# Patient Record
Sex: Female | Born: 1950
Health system: Southern US, Community
[De-identification: ages and names within clinical notes are randomized; demographics above are authoritative.]

## PROBLEM LIST (undated history)

## (undated) DIAGNOSIS — Z923 Personal history of irradiation: Secondary | ICD-10-CM

## (undated) DIAGNOSIS — M5030 Other cervical disc degeneration, unspecified cervical region: Secondary | ICD-10-CM

## (undated) DIAGNOSIS — R7309 Other abnormal glucose: Secondary | ICD-10-CM

## (undated) DIAGNOSIS — E119 Type 2 diabetes mellitus without complications: Secondary | ICD-10-CM

## (undated) DIAGNOSIS — J9383 Other pneumothorax: Secondary | ICD-10-CM

## (undated) DIAGNOSIS — R945 Abnormal results of liver function studies: Secondary | ICD-10-CM

## (undated) DIAGNOSIS — C3490 Malignant neoplasm of unspecified part of unspecified bronchus or lung: Secondary | ICD-10-CM

## (undated) DIAGNOSIS — E559 Vitamin D deficiency, unspecified: Secondary | ICD-10-CM

## (undated) DIAGNOSIS — K5730 Diverticulosis of large intestine without perforation or abscess without bleeding: Secondary | ICD-10-CM

## (undated) DIAGNOSIS — K7689 Other specified diseases of liver: Secondary | ICD-10-CM

## (undated) DIAGNOSIS — C50919 Malignant neoplasm of unspecified site of unspecified female breast: Secondary | ICD-10-CM

## (undated) DIAGNOSIS — F17200 Nicotine dependence, unspecified, uncomplicated: Secondary | ICD-10-CM

## (undated) DIAGNOSIS — G589 Mononeuropathy, unspecified: Secondary | ICD-10-CM

## (undated) DIAGNOSIS — Z9221 Personal history of antineoplastic chemotherapy: Secondary | ICD-10-CM

## (undated) DIAGNOSIS — E875 Hyperkalemia: Secondary | ICD-10-CM

## (undated) DIAGNOSIS — M47812 Spondylosis without myelopathy or radiculopathy, cervical region: Secondary | ICD-10-CM

## (undated) DIAGNOSIS — R3129 Other microscopic hematuria: Secondary | ICD-10-CM

## (undated) DIAGNOSIS — E785 Hyperlipidemia, unspecified: Secondary | ICD-10-CM

## (undated) DIAGNOSIS — F1010 Alcohol abuse, uncomplicated: Secondary | ICD-10-CM

## (undated) DIAGNOSIS — H269 Unspecified cataract: Secondary | ICD-10-CM

## (undated) DIAGNOSIS — Z853 Personal history of malignant neoplasm of breast: Secondary | ICD-10-CM

## (undated) HISTORY — PX: TUBAL LIGATION: SHX77

## (undated) HISTORY — DX: Diverticulosis of large intestine without perforation or abscess without bleeding: K57.30

## (undated) HISTORY — DX: Alcohol abuse, uncomplicated: F10.10

## (undated) HISTORY — DX: Malignant neoplasm of unspecified part of unspecified bronchus or lung: C34.90

## (undated) HISTORY — DX: Hyperkalemia: E87.5

## (undated) HISTORY — DX: Spondylosis without myelopathy or radiculopathy, cervical region: M47.812

## (undated) HISTORY — DX: Hyperlipidemia, unspecified: E78.5

## (undated) HISTORY — PX: MOUTH SURGERY: SHX715

## (undated) HISTORY — DX: Other microscopic hematuria: R31.29

## (undated) HISTORY — DX: Mononeuropathy, unspecified: G58.9

## (undated) HISTORY — DX: Personal history of malignant neoplasm of breast: Z85.3

## (undated) HISTORY — DX: Vitamin D deficiency, unspecified: E55.9

## (undated) HISTORY — DX: Other cervical disc degeneration, unspecified cervical region: M50.30

## (undated) HISTORY — DX: Other abnormal glucose: R73.09

## (undated) HISTORY — DX: Other specified diseases of liver: K76.89

## (undated) HISTORY — DX: Nicotine dependence, unspecified, uncomplicated: F17.200

## (undated) HISTORY — DX: Abnormal results of liver function studies: R94.5

## (undated) HISTORY — DX: Unspecified cataract: H26.9

## (undated) HISTORY — PX: COLONOSCOPY: SHX174

## (undated) MED FILL — Iron Sucrose Inj 20 MG/ML (Fe Equiv): INTRAVENOUS | Qty: 15 | Status: AC

---

## 1996-04-12 DIAGNOSIS — C50919 Malignant neoplasm of unspecified site of unspecified female breast: Secondary | ICD-10-CM

## 1996-04-12 HISTORY — DX: Malignant neoplasm of unspecified site of unspecified female breast: C50.919

## 1996-04-12 HISTORY — PX: BREAST LUMPECTOMY: SHX2

## 1998-02-14 ENCOUNTER — Other Ambulatory Visit: Admission: RE | Admit: 1998-02-14 | Discharge: 1998-02-14 | Payer: Self-pay | Admitting: Obstetrics and Gynecology

## 1999-07-02 ENCOUNTER — Other Ambulatory Visit: Admission: RE | Admit: 1999-07-02 | Discharge: 1999-07-02 | Payer: Self-pay | Admitting: Obstetrics and Gynecology

## 2000-07-01 ENCOUNTER — Other Ambulatory Visit: Admission: RE | Admit: 2000-07-01 | Discharge: 2000-07-01 | Payer: Self-pay | Admitting: Obstetrics and Gynecology

## 2001-10-27 ENCOUNTER — Other Ambulatory Visit: Admission: RE | Admit: 2001-10-27 | Discharge: 2001-10-27 | Payer: Self-pay | Admitting: Obstetrics and Gynecology

## 2001-12-06 ENCOUNTER — Encounter: Payer: Self-pay | Admitting: General Surgery

## 2001-12-06 ENCOUNTER — Encounter: Admission: RE | Admit: 2001-12-06 | Discharge: 2001-12-06 | Payer: Self-pay | Admitting: General Surgery

## 2001-12-08 ENCOUNTER — Ambulatory Visit (HOSPITAL_BASED_OUTPATIENT_CLINIC_OR_DEPARTMENT_OTHER): Admission: RE | Admit: 2001-12-08 | Discharge: 2001-12-08 | Payer: Self-pay | Admitting: General Surgery

## 2001-12-08 ENCOUNTER — Encounter (INDEPENDENT_AMBULATORY_CARE_PROVIDER_SITE_OTHER): Payer: Self-pay | Admitting: *Deleted

## 2001-12-11 HISTORY — PX: BREAST BIOPSY: SHX20

## 2002-10-26 ENCOUNTER — Encounter: Payer: Self-pay | Admitting: Family Medicine

## 2002-10-26 ENCOUNTER — Encounter: Admission: RE | Admit: 2002-10-26 | Discharge: 2002-10-26 | Payer: Self-pay | Admitting: Family Medicine

## 2002-11-02 ENCOUNTER — Other Ambulatory Visit: Admission: RE | Admit: 2002-11-02 | Discharge: 2002-11-02 | Payer: Self-pay | Admitting: Obstetrics and Gynecology

## 2002-12-24 ENCOUNTER — Encounter: Admission: RE | Admit: 2002-12-24 | Discharge: 2002-12-24 | Payer: Self-pay | Admitting: Family Medicine

## 2002-12-24 ENCOUNTER — Encounter: Payer: Self-pay | Admitting: Family Medicine

## 2003-06-14 ENCOUNTER — Encounter: Admission: RE | Admit: 2003-06-14 | Discharge: 2003-06-14 | Payer: Self-pay | Admitting: Family Medicine

## 2003-08-02 ENCOUNTER — Encounter: Admission: RE | Admit: 2003-08-02 | Discharge: 2003-08-02 | Payer: Self-pay | Admitting: Oncology

## 2003-12-06 LAB — HM COLONOSCOPY

## 2003-12-23 ENCOUNTER — Other Ambulatory Visit: Admission: RE | Admit: 2003-12-23 | Discharge: 2003-12-23 | Payer: Self-pay | Admitting: Obstetrics and Gynecology

## 2004-01-03 ENCOUNTER — Encounter: Admission: RE | Admit: 2004-01-03 | Discharge: 2004-01-03 | Payer: Self-pay | Admitting: Obstetrics and Gynecology

## 2004-01-11 ENCOUNTER — Encounter: Payer: Self-pay | Admitting: Family Medicine

## 2004-01-11 LAB — CONVERTED CEMR LAB

## 2004-04-12 DIAGNOSIS — M47812 Spondylosis without myelopathy or radiculopathy, cervical region: Secondary | ICD-10-CM

## 2004-04-12 DIAGNOSIS — M5030 Other cervical disc degeneration, unspecified cervical region: Secondary | ICD-10-CM

## 2004-04-12 HISTORY — DX: Spondylosis without myelopathy or radiculopathy, cervical region: M47.812

## 2004-04-12 HISTORY — DX: Other cervical disc degeneration, unspecified cervical region: M50.30

## 2004-04-23 ENCOUNTER — Ambulatory Visit: Payer: Self-pay | Admitting: Family Medicine

## 2004-04-23 LAB — CONVERTED CEMR LAB: Hgb A1c MFr Bld: 6.1 %

## 2004-04-24 ENCOUNTER — Ambulatory Visit: Payer: Self-pay | Admitting: Family Medicine

## 2004-05-26 ENCOUNTER — Ambulatory Visit: Payer: Self-pay | Admitting: Family Medicine

## 2004-07-30 ENCOUNTER — Ambulatory Visit: Payer: Self-pay | Admitting: Oncology

## 2004-07-31 ENCOUNTER — Ambulatory Visit (HOSPITAL_COMMUNITY): Admission: RE | Admit: 2004-07-31 | Discharge: 2004-07-31 | Payer: Self-pay | Admitting: Oncology

## 2004-08-04 ENCOUNTER — Encounter: Admission: RE | Admit: 2004-08-04 | Discharge: 2004-08-04 | Payer: Self-pay | Admitting: Oncology

## 2004-08-21 ENCOUNTER — Ambulatory Visit: Payer: Self-pay | Admitting: Family Medicine

## 2004-10-01 ENCOUNTER — Ambulatory Visit: Payer: Self-pay | Admitting: Oncology

## 2004-12-22 ENCOUNTER — Ambulatory Visit: Payer: Self-pay | Admitting: Family Medicine

## 2005-02-11 ENCOUNTER — Ambulatory Visit: Payer: Self-pay | Admitting: Family Medicine

## 2006-01-25 ENCOUNTER — Ambulatory Visit: Payer: Self-pay | Admitting: Family Medicine

## 2006-08-15 ENCOUNTER — Other Ambulatory Visit: Admission: RE | Admit: 2006-08-15 | Discharge: 2006-08-15 | Payer: Self-pay | Admitting: Obstetrics and Gynecology

## 2006-10-13 ENCOUNTER — Telehealth: Payer: Self-pay | Admitting: Family Medicine

## 2006-11-17 ENCOUNTER — Telehealth (INDEPENDENT_AMBULATORY_CARE_PROVIDER_SITE_OTHER): Payer: Self-pay | Admitting: *Deleted

## 2006-12-06 ENCOUNTER — Ambulatory Visit: Payer: Self-pay | Admitting: Family Medicine

## 2006-12-06 DIAGNOSIS — Z87891 Personal history of nicotine dependence: Secondary | ICD-10-CM | POA: Insufficient documentation

## 2006-12-06 DIAGNOSIS — Z853 Personal history of malignant neoplasm of breast: Secondary | ICD-10-CM | POA: Insufficient documentation

## 2006-12-06 DIAGNOSIS — R945 Abnormal results of liver function studies: Secondary | ICD-10-CM | POA: Insufficient documentation

## 2006-12-06 DIAGNOSIS — G589 Mononeuropathy, unspecified: Secondary | ICD-10-CM

## 2006-12-06 DIAGNOSIS — K5730 Diverticulosis of large intestine without perforation or abscess without bleeding: Secondary | ICD-10-CM | POA: Insufficient documentation

## 2006-12-06 DIAGNOSIS — G5793 Unspecified mononeuropathy of bilateral lower limbs: Secondary | ICD-10-CM | POA: Insufficient documentation

## 2006-12-06 DIAGNOSIS — K760 Fatty (change of) liver, not elsewhere classified: Secondary | ICD-10-CM | POA: Insufficient documentation

## 2006-12-06 DIAGNOSIS — F1011 Alcohol abuse, in remission: Secondary | ICD-10-CM | POA: Insufficient documentation

## 2006-12-08 ENCOUNTER — Encounter: Admission: RE | Admit: 2006-12-08 | Discharge: 2006-12-08 | Payer: Self-pay | Admitting: Family Medicine

## 2006-12-13 ENCOUNTER — Encounter (INDEPENDENT_AMBULATORY_CARE_PROVIDER_SITE_OTHER): Payer: Self-pay | Admitting: *Deleted

## 2006-12-21 ENCOUNTER — Ambulatory Visit: Payer: Self-pay | Admitting: Internal Medicine

## 2006-12-30 ENCOUNTER — Ambulatory Visit: Payer: Self-pay | Admitting: Family Medicine

## 2007-02-01 ENCOUNTER — Encounter (INDEPENDENT_AMBULATORY_CARE_PROVIDER_SITE_OTHER): Payer: Self-pay | Admitting: *Deleted

## 2007-03-30 ENCOUNTER — Ambulatory Visit: Payer: Self-pay | Admitting: Family Medicine

## 2007-03-30 DIAGNOSIS — M5030 Other cervical disc degeneration, unspecified cervical region: Secondary | ICD-10-CM | POA: Insufficient documentation

## 2007-04-02 ENCOUNTER — Encounter: Admission: RE | Admit: 2007-04-02 | Discharge: 2007-04-02 | Payer: Self-pay | Admitting: Family Medicine

## 2007-04-07 ENCOUNTER — Telehealth: Payer: Self-pay | Admitting: Family Medicine

## 2007-04-11 ENCOUNTER — Ambulatory Visit: Payer: Self-pay

## 2007-04-11 ENCOUNTER — Encounter: Payer: Self-pay | Admitting: Family Medicine

## 2007-04-18 ENCOUNTER — Ambulatory Visit: Payer: Self-pay | Admitting: Family Medicine

## 2007-04-27 ENCOUNTER — Encounter: Payer: Self-pay | Admitting: Family Medicine

## 2008-03-29 ENCOUNTER — Telehealth (INDEPENDENT_AMBULATORY_CARE_PROVIDER_SITE_OTHER): Payer: Self-pay | Admitting: Internal Medicine

## 2008-06-04 ENCOUNTER — Encounter: Payer: Self-pay | Admitting: Family Medicine

## 2008-06-14 ENCOUNTER — Ambulatory Visit: Payer: Self-pay | Admitting: Family Medicine

## 2008-06-14 DIAGNOSIS — E1169 Type 2 diabetes mellitus with other specified complication: Secondary | ICD-10-CM | POA: Insufficient documentation

## 2008-06-14 DIAGNOSIS — T887XXA Unspecified adverse effect of drug or medicament, initial encounter: Secondary | ICD-10-CM | POA: Insufficient documentation

## 2008-06-14 DIAGNOSIS — E785 Hyperlipidemia, unspecified: Secondary | ICD-10-CM | POA: Insufficient documentation

## 2008-06-17 LAB — CONVERTED CEMR LAB
ALT: 14 units/L (ref 0–35)
AST: 19 units/L (ref 0–37)
Albumin: 5.1 g/dL (ref 3.5–5.2)
Alkaline Phosphatase: 66 units/L (ref 39–117)
BUN: 12 mg/dL (ref 6–23)
Band Neutrophils: 0 % (ref 0–10)
Basophils Absolute: 0 10*3/uL (ref 0.0–0.1)
Basophils Relative: 0 % (ref 0–1)
CO2: 21 meq/L (ref 19–32)
Calcium: 10.3 mg/dL (ref 8.4–10.5)
Chloride: 103 meq/L (ref 96–112)
Cholesterol: 229 mg/dL — ABNORMAL HIGH (ref 0–200)
Creatinine, Ser: 0.8 mg/dL (ref 0.40–1.20)
Eosinophils Absolute: 0.1 10*3/uL (ref 0.0–0.7)
Eosinophils Relative: 1 % (ref 0–5)
Glucose, Bld: 80 mg/dL (ref 70–99)
HCT: 42.4 % (ref 36.0–46.0)
HDL: 72 mg/dL (ref >39)
Hemoglobin: 14.5 g/dL (ref 12.0–15.0)
Hgb A1c MFr Bld: 5.6 % (ref 4.6–6.1)
LDL Cholesterol: 130 mg/dL — ABNORMAL HIGH (ref 0–99)
Lymphocytes Relative: 37 % (ref 12–46)
Lymphs Abs: 3.1 10*3/uL (ref 0.7–4.0)
MCHC: 34.2 g/dL (ref 30.0–36.0)
MCV: 95.5 fL (ref 78.0–100.0)
Monocytes Absolute: 0.7 10*3/uL (ref 0.1–1.0)
Monocytes Relative: 9 % (ref 3–12)
Neutro Abs: 4.5 10*3/uL (ref 1.7–7.7)
Neutrophils Relative %: 53 % (ref 43–77)
Platelets: 308 10*3/uL (ref 150–400)
Potassium: 5 meq/L (ref 3.5–5.3)
RBC: 4.44 M/uL (ref 3.87–5.11)
RDW: 12.6 % (ref 11.5–15.5)
Sodium: 140 meq/L (ref 135–145)
Total Bilirubin: 0.7 mg/dL (ref 0.3–1.2)
Total CHOL/HDL Ratio: 3.2
Total Protein: 7.7 g/dL (ref 6.0–8.3)
Triglycerides: 134 mg/dL (ref <150)
VLDL: 27 mg/dL (ref 0–40)
WBC: 8.4 10*3/uL (ref 4.0–10.5)

## 2008-06-20 ENCOUNTER — Encounter: Admission: RE | Admit: 2008-06-20 | Discharge: 2008-06-20 | Payer: Self-pay | Admitting: Family Medicine

## 2008-06-25 ENCOUNTER — Encounter (INDEPENDENT_AMBULATORY_CARE_PROVIDER_SITE_OTHER): Payer: Self-pay | Admitting: *Deleted

## 2008-06-28 ENCOUNTER — Encounter: Payer: Self-pay | Admitting: Family Medicine

## 2008-06-28 ENCOUNTER — Other Ambulatory Visit: Admission: RE | Admit: 2008-06-28 | Discharge: 2008-06-28 | Payer: Self-pay | Admitting: Family Medicine

## 2008-06-28 ENCOUNTER — Ambulatory Visit: Payer: Self-pay | Admitting: Family Medicine

## 2008-12-30 ENCOUNTER — Ambulatory Visit: Payer: Self-pay | Admitting: Family Medicine

## 2009-01-01 LAB — CONVERTED CEMR LAB
ALT: 15 units/L (ref 0–35)
AST: 26 units/L (ref 0–37)
Cholesterol: 248 mg/dL — ABNORMAL HIGH (ref 0–200)
Direct LDL: 158.1 mg/dL
HDL: 63.2 mg/dL (ref >39.00)
Total CHOL/HDL Ratio: 4
Triglycerides: 86 mg/dL (ref 0.0–149.0)
VLDL: 17.2 mg/dL (ref 0.0–40.0)

## 2009-02-12 ENCOUNTER — Ambulatory Visit: Payer: Self-pay | Admitting: Family Medicine

## 2009-02-14 LAB — CONVERTED CEMR LAB
ALT: 18 units/L (ref 0–35)
AST: 27 units/L (ref 0–37)
Cholesterol: 252 mg/dL — ABNORMAL HIGH (ref 0–200)
Direct LDL: 180.5 mg/dL
Glucose, Bld: 90 mg/dL (ref 70–99)
HDL: 64.7 mg/dL (ref >39.00)
Total CHOL/HDL Ratio: 4
Triglycerides: 70 mg/dL (ref 0.0–149.0)
VLDL: 14 mg/dL (ref 0.0–40.0)

## 2009-02-18 ENCOUNTER — Ambulatory Visit: Payer: Self-pay | Admitting: Family Medicine

## 2009-02-18 DIAGNOSIS — E119 Type 2 diabetes mellitus without complications: Secondary | ICD-10-CM | POA: Insufficient documentation

## 2009-02-18 DIAGNOSIS — R7303 Prediabetes: Secondary | ICD-10-CM | POA: Insufficient documentation

## 2009-02-18 DIAGNOSIS — E118 Type 2 diabetes mellitus with unspecified complications: Secondary | ICD-10-CM | POA: Insufficient documentation

## 2009-06-24 ENCOUNTER — Ambulatory Visit: Payer: Self-pay | Admitting: Family Medicine

## 2009-06-24 LAB — CONVERTED CEMR LAB
BUN: 11 mg/dL (ref 6–23)
Creatinine, Ser: 0.6 mg/dL (ref 0.4–1.2)

## 2009-06-27 ENCOUNTER — Ambulatory Visit: Payer: Self-pay | Admitting: Cardiovascular Disease

## 2009-07-29 ENCOUNTER — Telehealth: Payer: Self-pay | Admitting: Family Medicine

## 2009-07-29 ENCOUNTER — Ambulatory Visit: Payer: Self-pay | Admitting: Family Medicine

## 2009-07-30 LAB — CONVERTED CEMR LAB: Vit D, 25-Hydroxy: 21 ng/mL — ABNORMAL LOW (ref 30–89)

## 2009-07-31 LAB — CONVERTED CEMR LAB
ALT: 15 units/L (ref 0–35)
AST: 22 units/L (ref 0–37)
Albumin: 4.2 g/dL (ref 3.5–5.2)
Alkaline Phosphatase: 63 units/L (ref 39–117)
BUN: 10 mg/dL (ref 6–23)
Basophils Absolute: 0 10*3/uL (ref 0.0–0.1)
Basophils Relative: 0.4 % (ref 0.0–3.0)
Bilirubin, Direct: 0 mg/dL (ref 0.0–0.3)
CO2: 29 meq/L (ref 19–32)
Calcium: 9.8 mg/dL (ref 8.4–10.5)
Chloride: 106 meq/L (ref 96–112)
Cholesterol: 223 mg/dL — ABNORMAL HIGH (ref 0–200)
Creatinine, Ser: 0.8 mg/dL (ref 0.4–1.2)
Direct LDL: 132.4 mg/dL
Eosinophils Absolute: 0 10*3/uL (ref 0.0–0.7)
Eosinophils Relative: 0.7 % (ref 0.0–5.0)
GFR calc non Af Amer: 78.13 mL/min (ref >60)
Glucose, Bld: 95 mg/dL (ref 70–99)
HCT: 40.5 % (ref 36.0–46.0)
HDL: 72.5 mg/dL (ref >39.00)
Hemoglobin: 14 g/dL (ref 12.0–15.0)
Hgb A1c MFr Bld: 5.9 % (ref 4.6–6.5)
Lymphocytes Relative: 26 % (ref 12.0–46.0)
Lymphs Abs: 1.7 10*3/uL (ref 0.7–4.0)
MCHC: 34.5 g/dL (ref 30.0–36.0)
MCV: 100.3 fL — ABNORMAL HIGH (ref 78.0–100.0)
Monocytes Absolute: 0.5 10*3/uL (ref 0.1–1.0)
Monocytes Relative: 7.8 % (ref 3.0–12.0)
Neutro Abs: 4.2 10*3/uL (ref 1.4–7.7)
Neutrophils Relative %: 65.1 % (ref 43.0–77.0)
Platelets: 244 10*3/uL (ref 150.0–400.0)
Potassium: 5.2 meq/L — ABNORMAL HIGH (ref 3.5–5.1)
RBC: 4.04 M/uL (ref 3.87–5.11)
RDW: 13.8 % (ref 11.5–14.6)
Sodium: 143 meq/L (ref 135–145)
TSH: 1.77 microintl units/mL (ref 0.35–5.50)
Total Bilirubin: 0.6 mg/dL (ref 0.3–1.2)
Total CHOL/HDL Ratio: 3
Total Protein: 7.5 g/dL (ref 6.0–8.3)
Triglycerides: 92 mg/dL (ref 0.0–149.0)
VLDL: 18.4 mg/dL (ref 0.0–40.0)
WBC: 6.4 10*3/uL (ref 4.5–10.5)

## 2009-08-04 ENCOUNTER — Other Ambulatory Visit: Admission: RE | Admit: 2009-08-04 | Discharge: 2009-08-04 | Payer: Self-pay | Admitting: Family Medicine

## 2009-08-04 ENCOUNTER — Encounter: Admission: RE | Admit: 2009-08-04 | Discharge: 2009-08-04 | Payer: Self-pay | Admitting: Family Medicine

## 2009-08-04 ENCOUNTER — Ambulatory Visit: Payer: Self-pay | Admitting: Family Medicine

## 2009-08-04 DIAGNOSIS — E559 Vitamin D deficiency, unspecified: Secondary | ICD-10-CM | POA: Insufficient documentation

## 2009-08-04 DIAGNOSIS — E875 Hyperkalemia: Secondary | ICD-10-CM | POA: Insufficient documentation

## 2009-08-04 DIAGNOSIS — Z780 Asymptomatic menopausal state: Secondary | ICD-10-CM | POA: Insufficient documentation

## 2009-08-04 LAB — HM MAMMOGRAPHY: HM Mammogram: NEGATIVE

## 2009-08-04 LAB — HM PAP SMEAR

## 2009-08-06 ENCOUNTER — Encounter (INDEPENDENT_AMBULATORY_CARE_PROVIDER_SITE_OTHER): Payer: Self-pay | Admitting: *Deleted

## 2009-08-12 ENCOUNTER — Encounter (INDEPENDENT_AMBULATORY_CARE_PROVIDER_SITE_OTHER): Payer: Self-pay | Admitting: *Deleted

## 2009-08-12 LAB — CONVERTED CEMR LAB: Pap Smear: NEGATIVE

## 2009-09-10 ENCOUNTER — Telehealth: Payer: Self-pay | Admitting: Family Medicine

## 2009-09-15 ENCOUNTER — Telehealth: Payer: Self-pay | Admitting: Family Medicine

## 2009-10-28 ENCOUNTER — Encounter (INDEPENDENT_AMBULATORY_CARE_PROVIDER_SITE_OTHER): Payer: Self-pay | Admitting: *Deleted

## 2010-04-08 ENCOUNTER — Telehealth: Payer: Self-pay | Admitting: Family Medicine

## 2010-04-27 ENCOUNTER — Telehealth: Payer: Self-pay | Admitting: Family Medicine

## 2010-05-14 NOTE — Progress Notes (Signed)
Summary: refill request for flexeril  Phone Note Refill Request Message from:  Fax from Pharmacy  Refills Requested: Medication #1:  CYCLOBENZAPRINE HCL 10 MG  TABS Take 1 tablet by mouth three time a day prn   Last Refilled: 12/22/2004 Faxed request from [facility]  Initial call taken by: Marty Heck,  March 29, 2008 2:04 PM  Follow-up for Phone Call        Rx attatched  Dixie Dials FNP  March 29, 2008 2:19 PM   Additional Follow-up for Phone Call Additional follow up Details #1::        Called to [facility] Additional Follow-up by: Marty Heck,  March 29, 2008 2:34 PM      Prescriptions: CYCLOBENZAPRINE HCL 10 MG  TABS (CYCLOBENZAPRINE HCL) Take 1 tablet by mouth three time a day prn  #90 x 0   Entered and Authorized by:   Dixie Dials FNP   Signed by:   Dixie Dials FNP on 03/29/2008   Method used:   Print then Give to Patient   RxID:   [id number]

## 2010-05-14 NOTE — Assessment & Plan Note (Signed)
Summary: CPX AND PAP/DLO   Vital Signs:  Patient profile:   60 year old female Height:      65 inches Weight:      124.50 pounds BMI:     20.79 Temp:     98.1 degrees F oral Pulse rate:   92 / minute Pulse rhythm:   regular BP sitting:   136 / 82  (left arm) Cuff size:   regular  Vitals Entered By: Ozzie Hoyle LPN (August 05, 4799 6:55 PM) CC: CPX with pap and breast exam LMP 1998   History of Present Illness: here for health mt and to disc chronic med problems   has been feeling fairly good overall  things are going well  has been on vacation -- doing a lot of hard cleaning -- is sore from that  is out of indomethacin   wt is stable  bp 136/82 today   recent labs lipids trig 92/ HDL 72 and LDL 132 -- even though she did not start chol med quit sausage biscuit/ went to high fiber bread and loves it/ also oatmeal  this is a big improvement  has not started full exercise yet  liver fxn tests are nl  alcohol-- still drinks 1-2 beers per day   smoking -- is going to try to quit next week with patches again   hx of breast ca due for mam-- just did it this am -- waiting on result  slf exam -- no lumps or changes   vit D is 21 - low ca and D-- dexa- heel in past   hyperglycemia --AIC 5.9   pap 3/10 nl  wants to get this today   colon 05 -up to date and no polyps   Td 04 up to date on pneumovax   K on high side --- no high K foods or supplements- so does not know why  is eating very healthy diet   Allergies (verified): No Known Drug Allergies  Past History:  Past Medical History: Last updated: 02/18/2009 Breast cancer, hx of hyperglycemia  Diverticulosis, colon cervical deg disc- 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) alcoholism tab abuse   ortho-- Cohen  Past Surgical History: Last updated: 04/15/2007 Lumpectomy CT liver- fatty liver (02/2000) Right breast biopsy (12/2001) Abd [place]- fatty liver (05/2002) T score heel dexa .3 (06/2003) Dexa-  pos T scores (12/2003) Colonoscopy Deg disk disease C-S (04/2005) nuclear stress test normal 12/08  Family History: Last updated: 12/06/2006 Father: CHF Mother:  Siblings: 3 brothers, 1 sister  Social History: Last updated: 06/14/2008 Marital Status: divorced Children: 1 Occupation: [employer] alcohol intake mod  smoker  Risk Factors: Smoking Status: current (12/06/2006)  Review of Systems General:  Denies fatigue, fever, loss of appetite, and malaise. Eyes:  Denies blurring and eye irritation. CV:  Denies chest pain or discomfort and palpitations. Resp:  Denies cough and shortness of breath. GI:  Denies abdominal pain, bloody stools, change in bowel habits, indigestion, nausea, and vomiting. GU:  Denies abnormal vaginal bleeding, discharge, dysuria, and urinary frequency. MS:  Denies joint pain, joint redness, and joint swelling. Derm:  Denies lesion(s), poor wound healing, and rash. Neuro:  Denies numbness and tingling. Psych:  Denies anxiety and depression. Endo:  Denies cold intolerance, excessive thirst, excessive urination, and heat intolerance. Heme:  Denies abnormal bruising and bleeding.   Impression & Recommendations:  Problem # 1:  HEALTH MAINTENANCE EXAM (ICD-V70.0) Assessment Comment Only reviewed health habits including diet, exercise and skin cancer prevention  reviewed health maintenance list and family history rev labs in detail disc imp of tab and alcohol cessation  Problem # 2:  ROUTINE GYNECOLOGICAL EXAMINATION (ICD-V72.31) Assessment: Comment Only annual pap done today with exam   Problem # 3:  POSTMENOPAUSAL STATUS (ICD-V49.81) Assessment: New sched dexa - mult risk factors for op -- slim/ tab/ post men/ alcohol  rev need for ca and D and exercise  Orders: Radiology Referral (Radiology)  Problem # 4:  Hx of TOBACCO ABUSE (ICD-305.1) Assessment: Unchanged discussed in detail risks of smoking, and possible outcomes including COPD,  vascular dz, cancer and also respiratory infections/sinus problems  pt plans to quit next week with patches- commended effort  Orders: Radiology Referral (Radiology)  Problem # 5:  Hx of ALCOHOL ABUSE (ICD-305.00) Assessment: Unchanged again stressed imp of drinking less or none  rev safe alcohol intake for female  Problem # 6:  BREAST CANCER, HX OF (ICD-V10.3) Assessment: Unchanged mam done today- pend result no change in breast exam enc self exams also   Problem # 7:  HYPERKALEMIA (ICD-276.7) Assessment: New ? why high - poss from diet  enc fluid intake rev high K foods to avoid -- and re check 12 wk lab  Problem # 8:  UNSPECIFIED VITAMIN D DEFICIENCY (ICD-268.9) Assessment: New  will start on 12 weeks of high dose tx and re check  disc imp to bone and overall health  no symptoms currently   Orders: Prescription Created Electronically [phone])  Complete Medication List: 1)  Cyclobenzaprine Hcl 10 Mg Tabs (Cyclobenzaprine hcl) .... Take 1 tablet by mouth three time a day as needed 2)  Indomethacin 50 Mg Caps (Indomethacin) .Marland Kitchen.. 1 by mouth two times a day as needed 3)  Vitamin D (ergocalciferol) 50000 Unit Caps (Ergocalciferol) .Marland Kitchen.. 1 by mouth once weekly for 12 weeks  Patient Instructions: 1)  you need to start calcium 1200 -1500 mg per day- chewable is fine  2)  take the vit D for 12 weeks as directed  3)  schedule non fasting labs for vit D and K in 12 weeks for vit D def and and hyperkalemia  4)  please make effort to drink more water  5)  we will set up bone density test at check out  6)  cholesterol is improved -keep up the better diet and add some exercise  Prescriptions: VITAMIN D (ERGOCALCIFEROL) 50000 UNIT CAPS (ERGOCALCIFEROL) 1 by mouth once weekly for 12 weeks  #12 x 0   Entered and Authorized by:   Allena Earing MD   Signed by:   Allena Earing MD on 08/04/2009   Method used:   Electronically to        [facility] (retail)       [address]       [place], [place]  [address]       Ph: [phone]       Fax: [phone]   RxID:   [id number]   Current Allergies (reviewed today): No known allergies   Appended Document: CPX AND PAP/DLO     Allergies: No Known Drug Allergies  Physical Exam  General:  Well-developed,well-nourished,in no acute distress; alert,appropriate and cooperative throughout examination VSS  Head:  normocephalic, atraumatic, and no abnormalities observed.   Eyes:  vision grossly intact, pupils equal, pupils round, pupils reactive to light, and no injection.  , no icterus  Ears:  R ear normal.   Nose:  no nasal discharge.   Mouth:  pharynx pink  and moist.   Neck:  supple with full rom and no masses or thyromegally, no JVD or carotid bruit  Chest Wall:  No deformities, masses, or tenderness noted. Breasts:  surgical changes noted no M , skin change on nipple d/c noted  Lungs:  diffusely distant bs  no wheeze today Heart:  Normal rate and regular rhythm. S1 and S2 normal without gallop, murmur, click, rub or other extra sounds. Abdomen:  Bowel sounds positive,abdomen soft and non-tender without masses, organomegaly or hernias noted. no renal bruits  Genitalia:  Normal introitus for age, no external lesions, no vaginal discharge, mucosa pink and moist, no vaginal or cervical lesions, no vaginal atrophy, no friaility or hemorrhage, normal uterus size and position, no adnexal masses or tenderness Msk:  No deformity or scoliosis noted of thoracic or lumbar spine.  no acute joint changes Pulses:  R and L carotid,radial,femoral,dorsalis pedis and posterior tibial pulses are full and equal bilaterally Extremities:  No clubbing, cyanosis, edema, or deformity noted with normal full range of motion of all joints.   Neurologic:  sensation intact to light touch, gait normal, and DTRs symmetrical and normal.  no tremor  Skin:  Intact without suspicious lesions or rashes some lentigos and skin  tags Cervical Nodes:  No lymphadenopathy noted Axillary Nodes:  No palpable lymphadenopathy Inguinal Nodes:  No significant adenopathy Psych:  normal affect, talkative and pleasant    Complete Medication List: 1)  Cyclobenzaprine Hcl 10 Mg Tabs (Cyclobenzaprine hcl) .... Take 1 tablet by mouth three time a day as needed 2)  Indomethacin 50 Mg Caps (Indomethacin) .Marland Kitchen.. 1 by mouth two times a day as needed 3)  Vitamin D (ergocalciferol) 50000 Unit Caps (Ergocalciferol) .Marland Kitchen.. 1 by mouth once weekly for 12 weeks

## 2010-05-14 NOTE — Assessment & Plan Note (Signed)
Summary: COLD,EARS/CLE   Vital Signs:  Patient Profile:   60 Years Old Female Weight:      120 pounds Temp:     98.1 degrees F oral Pulse rate:   72 / minute Pulse rhythm:   regular BP sitting:   140 / 82  (left arm) Cuff size:   regular  Vitals Entered By: Marty Heck (December 06, 2006 11:46 AM)               Chief Complaint:  Sinus congestion and left ear hurting.  History of Present Illness: sinus congestion like a head cold- worse this am L ear has been a problem- just feels wrong- would occas get water in it  started drying it up with q tips- and feels a little different no fever, no colored d/c cold sympt since this am= a little cough  had pap about 6 mo ago needs mam appt still a smoker- tried chantix- and can't tolerate full dose (got sluggish feeling- and nausea) did not get all the way off cig- but did not smoke as much, tasted bad         Review of Systems      See HPI  General      Complains of weight loss.      Denies chills, fatigue, fever, and loss of appetite.      thinks wt loss has leveled out- may have been intentional  Eyes      Denies eye irritation.  ENT      Complains of nasal congestion and postnasal drainage.      Denies ear discharge.  CV      Denies chest pain or discomfort.  Resp      Complains of cough.      Denies shortness of breath and wheezing.  GI      Denies change in bowel habits, nausea, and vomiting.  Derm      Denies changes in color of skin and rash.  Psych      mood is ok  Endo      Denies excessive thirst and excessive urination.   Physical Exam  General:     slim, and well appearing Head:     Normocephalic and atraumatic without obvious abnormalities. No apparent alopecia or balding. no sinus tenderness Eyes:     vision grossly intact, pupils equal, pupils round, pupils reactive to light, and no injection.   Ears:     R ear normal.  L ear has deep partial cerumen impaction Nose:  mucosal erythema.   Mouth:     pharynx pink and moist.   Neck:     No deformities, masses, or tenderness noted.supple, no thyromegaly, and no JVD.   Lungs:     diffusely distant bs without rales/rhonchi/wheeze Heart:     Normal rate and regular rhythm. S1 and S2 normal without gallop, murmur, click, rub or other extra sounds. Msk:     no acute joint changes Extremities:     No clubbing, cyanosis, edema, or deformity noted with normal full range of motion of all joints.   Neurologic:     no tremor Skin:     turgor normal, color normal, and no rashes.   Cervical Nodes:     No lymphadenopathy noted Psych:     nl affect    Impression & Recommendations:  Problem # 1:  Hx of TOBACCO ABUSE (ICD-305.1) pt wants to try chantix again at lower dose is motivated to quit  Her updated medication list for this problem includes:    Chantix Starting Month Pak 0.5 Mg X 11 & 1 Mg X 42 Misc (Varenicline tartrate) .Marland Kitchen... Take by mouth as directed    Chantix 1 Mg Tabs (Varenicline tartrate) .Marland Kitchen... 1 by mouth two times a day to start after starter pack  Orders: CXR- 2view (CXR)   Problem # 2:  BREAST CANCER, HX OF (ICD-V10.3) schedule mam and will do breast exam at f/u Orders: CXR- 2view (CXR) Mammogram (Screening) (Mammo)   Problem # 3:  U R I (OMB-559.9) will try some zinc losenges and symptomatic care watch for high fever or shortness of breath The following medications were removed from the medication list:    Indomethacin 25 Mg Caps (Indomethacin) .Marland Kitchen... Take 2 q 12 hours as needed pain   Problem # 4:  CERUMEN IMPACTION (ICD-380.4) L ear only- this may be causing her discomfort (plus or minus etd) will recommend use of debrox- then f/u in 2 weeks to flush it  Complete Medication List: 1)  Chantix Starting Month Pak 0.5 Mg X 11 & 1 Mg X 42 Misc (Varenicline tartrate) .... Take by mouth as directed 2)  Chantix 1 Mg Tabs (Varenicline tartrate) .Marland Kitchen.. 1 by mouth two times a day to start  after starter pack   Patient Instructions: 1)  get a product called debrox over the counter and use as directed twice weekly 2)  follow up in about 2 weeks- we will try to flush ear and also get some health maintenence labs     Prior Medications: CHANTIX STARTING MONTH PAK 0.5 MG X 11 & 1 MG X 42  MISC (VARENICLINE TARTRATE) take by mouth as directed CHANTIX 1 MG  TABS (VARENICLINE TARTRATE) 1 by mouth two times a day to start after starter pack    CXR  Procedure date:  12/06/2006  Findings:      no acute changes, some hyperinflation old sx changes from lumpectomy

## 2010-05-14 NOTE — Assessment & Plan Note (Signed)
Summary: hurts to breath/ alc   Vital Signs:  Patient profile:   60 year old female Height:      65 inches Weight:      123 pounds BMI:     20.54 O2 Sat:      100 % on Room air Temp:     97.9 degrees F oral Pulse rate:   92 / minute Pulse rhythm:   regular BP sitting:   122 / 80  (left arm) Cuff size:   regular  Vitals Entered By: Christena Deem CMA Deborra Medina) (June 24, 2009 11:10 AM)  O2 Flow:  Room air CC: Hurts to breathe   History of Present Illness: 60 yo here with painful inspiration.  Started 3 days ago, easing off now.  Only on right side. Always has cough, she is chronic smoker, has been increased much during last few days. No shortness of breath. COugh is not productive. No fevers, chills, weight loss, or night sweats.  Had a similar episode a few months ago, but it resolved on its own.  PMH- significant for h/o breast cancer (right) and long term tobacco abuse.  Current Medications (verified): 1)  Cyclobenzaprine Hcl 10 Mg  Tabs (Cyclobenzaprine Hcl) .... Take 1 Tablet By Mouth Three Time A Day Prn 2)  Indomethacin 50 Mg Caps (Indomethacin) .Marland Kitchen.. 1 By Mouth Two Times A Day As Needed 3)  Zocor 20 Mg Tabs (Simvastatin) .Marland Kitchen.. 1 By Mouth Once Daily in Evening With A Low Fat Snack  Allergies (verified): No Known Drug Allergies  Past History:  Past Medical History: Last updated: 02/18/2009 Breast cancer, hx of hyperglycemia  Diverticulosis, colon cervical deg disc- 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) alcoholism tab abuse   ortho-- Cohen  Past Surgical History: Last updated: 04/15/2007 Lumpectomy CT liver- fatty liver (02/2000) Right breast biopsy (12/2001) Abd [place]- fatty liver (05/2002) T score heel dexa .3 (06/2003) Dexa- pos T scores (12/2003) Colonoscopy Deg disk disease C-S (04/2005) nuclear stress test normal 12/08  Family History: Last updated: 12/06/2006 Father: CHF Mother:  Siblings: 3 brothers, 1 sister  Social History: Last  updated: 06/14/2008 Marital Status: divorced Children: 1 Occupation: [employer] alcohol intake mod  smoker  Review of Systems      See HPI General:  Denies chills, fatigue, fever, malaise, and sweats. Resp:  Complains of pleuritic; denies cough, coughing up blood, shortness of breath, sputum productive, and wheezing. Derm:  Denies rash.  Physical Exam  General:  Well-developed,well-nourished,in no acute distress; alert,appropriate and cooperative throughout examination VSS  Mouth:  MMM Lungs:  scant wheezes on right. diffusely distant breath sounds. chest wall NTTP. No crackles, no increased work of breathing. Heart:  Normal rate and regular rhythm. S1 and S2 normal without gallop, murmur, click, rub or other extra sounds. Extremities:  no edema Skin:  Intact without suspicious lesions or rashes Psych:  normal affect, talkative and pleasant    Impression & Recommendations:  Problem # 1:  PAINFUL RESPIRATION (UKG-254.27) Assessment New Differential is wide for pleurisy.  Given history of breast CA, tobacco abuse and mildy abnormal lung exam, we need further imaging to rule out more serious etilogy.  Unlikely pneumonia- no sputum production, fevers, inc WOB. CXR will likely be abnormal given prior breast surgeries with scar tissue. Given this complicating factor, will refer for CT of chest with contrast (normal kidney function). Her updated medication list for this problem includes:    Indomethacin 50 Mg Caps (Indomethacin) .Marland Kitchen... 1 by mouth two times a day  as needed  Orders: Radiology Referral (Radiology)  Complete Medication List: 1)  Cyclobenzaprine Hcl 10 Mg Tabs (Cyclobenzaprine hcl) .... Take 1 tablet by mouth three time a day prn 2)  Indomethacin 50 Mg Caps (Indomethacin) .Marland Kitchen.. 1 by mouth two times a day as needed 3)  Zocor 20 Mg Tabs (Simvastatin) .Marland Kitchen.. 1 by mouth once daily in evening with a low fat snack  Patient Instructions: 1)  Please stop by to see Rosaria Ferries on  your way out to set up your CAT scan. 2)  Also please ask front office staff t make appointment for you for Pap smear and fasting lab work.  Appended Document: hurts to breath/ alc

## 2010-05-14 NOTE — Assessment & Plan Note (Signed)
Summary: pap & discuss labs/bir   Vital Signs:  Patient profile:   60 year old female Height:      65 inches Weight:      123 pounds BMI:     20.54 Temp:     97.6 degrees F oral Pulse rate:   88 / minute Pulse rhythm:   regular BP sitting:   126 / 80  (left arm) Cuff size:   regular  Vitals Entered By: Daralene Milch (June 28, 2008 11:43 AM)  History of Present Illness:  last pap 05 no gyn probs, no bleeding , not sexually active  no abn paps in the past   mam nl recent in march - very reassuring   DM- good control with AIC of 5.6 (borderline)  still smoking   Td is utd 04 flu- refuses so far- ? open to one next year  pneumovax   nl dexa in 05 heel scan was 06/04/18 with T score of .8 is on calcium and vit D and tolerating wel   lipid checked - high LDL at 130, good HDL 72 and trig 134 does eat a very good diet - avoids red meat - and very little fried foods  occ bonjangles fried chicken  changed biscuit to [employer] - with whole wheat   last colonoscopy was 2005- tics, and recommended f/u in 10 year uses fiber all the time too - and it works well for her      Allergies: No Known Drug Allergies  Past History:  Past Medical History:    Breast cancer, hx of    Diabetes mellitus, type II    Diverticulosis, colon    cervical deg disc- 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)    alcoholism    tab abuse    ortho-- Cohen     (06/14/2008)  Past Surgical History:    Lumpectomy    CT liver- fatty liver (02/2000)    Right breast biopsy (12/2001)    Abd [place]- fatty liver (05/2002)    T score heel dexa .3 (06/2003)    Dexa- pos T scores (12/2003)    Colonoscopy    Deg disk disease C-S (04/2005)    nuclear stress test normal 12/08     (04/15/2007)  Family History:    Father: CHF    Mother:     Siblings: 3 brothers, 1 sister     (12/06/2006)  Social History:    Marital Status: divorced    Children: 1    Occupation: [employer]    alcohol intake mod      smoker     (06/14/2008)  Review of Systems General:  Denies fatigue, fever, loss of appetite, and malaise. Eyes:  Denies blurring and eye irritation. CV:  Denies chest pain or discomfort, palpitations, shortness of breath with exertion, and swelling of feet. Resp:  Denies cough, pleuritic, shortness of breath, and wheezing. GI:  Denies abdominal pain, bloody stools, change in bowel habits, and indigestion. GU:  Denies discharge, dysuria, and urinary frequency. MS:  Denies joint pain. Derm:  Denies lesion(s), poor wound healing, and rash. Neuro:  Denies numbness and tingling. Psych:  Denies anxiety and depression. Endo:  Denies cold intolerance, excessive thirst, excessive urination, and heat intolerance.  Physical Exam  General:  slim and generally well appearing Head:  normocephalic, atraumatic, and no abnormalities observed.   Eyes:  vision grossly intact, pupils equal, pupils round, and pupils reactive to light.   Neck:  tender over lower  CS with full rom and no spasm noted  no JVD or carotid bruits or thyromegally Lungs:  diffusely distant bs scant wheeze on forced exp only Heart:  Normal rate and regular rhythm. S1 and S2 normal without gallop, murmur, click, rub or other extra sounds. Abdomen:  Bowel sounds positive,abdomen soft and non-tender without masses, organomegaly or hernias noted. Genitalia:  Normal introitus for age, no external lesions, no vaginal discharge, mucosa pink and moist, no vaginal or cervical lesions, no vaginal atrophy, no friaility or hemorrhage, normal uterus size and position, no adnexal masses or tenderness Pulses:  R and L carotid,radial,femoral,dorsalis pedis and posterior tibial pulses are full and equal bilaterally Extremities:  No clubbing, cyanosis, edema, or deformity noted with normal full range of motion of all joints.   Neurologic:  sensation intact to light touch, gait normal, and DTRs symmetrical and normal.  no tremor Skin:  Intact  without suspicious lesions or rashes lentigos diffusely  Cervical Nodes:  No lymphadenopathy noted Inguinal Nodes:  No significant adenopathy Psych:  normal affect, talkative and pleasant    Impression & Recommendations:  Problem # 1:  ROUTINE GYNECOLOGICAL EXAMINATION (ICD-V72.31) Assessment Comment Only annual exam with pap (breast exam done prev)  Problem # 2:  HYPERLIPIDEMIA (ICD-272.4)  reviewed lipids- goal for LDL is below 100- in light of smoking pt will work on this with diet change - elim fried foods/biscuits  re check 6 mo  consider statin if not imp  Labs Reviewed: Chol: 229 (06/14/2008)   HDL: 72 (06/14/2008)   LDL: 130 (06/14/2008)   TG: 134 (06/14/2008) SGOT: 19 (06/14/2008)   SGPT: 14 (06/14/2008)  Problem # 3:  Hx of FATTY LIVER DISEASE (ICD-571.8) Assessment: Improved LFTs are normal  disc cutting/elim alcohol/ avoid otc meds  low fat diet will continue to monitor   Problem # 4:  Hx of TOBACCO ABUSE (ICD-305.1) Assessment: Unchanged discussed in detail risks of smoking, and possible outcomes including COPD, vascular dz, cancer and also respiratory infections/sinus problems   pt voiced understanding= unsure if ready to quit at this time  Problem # 5:  DIABETES MELLITUS, TYPE II (ICD-250.00) Assessment: Improved AIC now under 6 will continue to follow with low sugar diet  Problem # 6:  BREAST CANCER, HX OF (ICD-V10.3) Assessment: Comment Only stable breast exam and mammogram  urged regular self breast checks   Problem # 7:  Hx of ALCOHOL ABUSE (ICD-305.00) Assessment: Comment Only intake has cut down significantly per pt  again adv optimal to completely quit  pt understands health risks of continuing to drink  Complete Medication List: 1)  Cyclobenzaprine Hcl 10 Mg Tabs (Cyclobenzaprine hcl) .... Take 1 tablet by mouth three time a day prn 2)  Indomethacin 50 Mg Caps (Indomethacin) .Marland Kitchen.. 1 by mouth two times a day as needed  Other Orders:  Pneumococcal Vaccine [phone]) Admin 1st Vaccine [phone])  Preventive Care Screening  Colonoscopy:    Date:  12/06/2003    Next Due:  12/2013    Results:  Diverticulosis    Patient Instructions: 1)  you can raise your HDL (good cholesterol) by increasing exercise and eating omega 3 fatty acid supplement like fish oil or flax seed oil over the counter 2)  you can lower LDL (bad cholesterol) by limiting saturated fats in diet like red meat, fried foods, egg yolks, fatty breakfast meats, high fat dairy products and shellfish  3)  schedule fasting labs for cholesterol (lipid/ast/alt) in 6 months  4)  keep working  on quitting smoking  5)  pneumovax today  6)  I want you to get flu shot next fall  7)  The medication list was reviewed and reconciled.  All changed / newly prescribed medications were explained.  A complete medication list was provided to the patient / caregiver.      Prior Medications (reviewed today): CYCLOBENZAPRINE HCL 10 MG  TABS (CYCLOBENZAPRINE HCL) Take 1 tablet by mouth three time a day prn INDOMETHACIN 50 MG CAPS (INDOMETHACIN) 1 by mouth two times a day as needed Current Allergies (reviewed today): No known allergies  Current Medications (including changes made in today's visit):  CYCLOBENZAPRINE HCL 10 MG  TABS (CYCLOBENZAPRINE HCL) Take 1 tablet by mouth three time a day prn INDOMETHACIN 50 MG CAPS (INDOMETHACIN) 1 by mouth two times a day as needed   Preventive Care Screening  Colonoscopy:    Date:  12/06/2003    Next Due:  12/2013    Results:  Diverticulosis      Pneumovax Vaccine    Vaccine Type: Pneumovax    Site: left deltoid    Mfr: Merck    Dose: 0.5 ml    Route: IM    Given by: Daralene Milch    Exp. Date: 06/05/2009    Lot #: [id number]    VIS given: 11/08/95 version given June 28, 2008.

## 2010-05-14 NOTE — Assessment & Plan Note (Signed)
Summary: REFILL MEDICATION/CLE   Vital Signs:  Patient Profile:   60 Years Old Female Weight:      123 pounds Temp:     97.7 degrees F oral Pulse rate:   84 / minute Pulse rhythm:   regular BP sitting:   120 / 80  (left arm) Cuff size:   regular  Vitals Entered By: Daralene Milch (June 14, 2008 3:44 PM)                 PCP:  Jenella Craigie  Chief Complaint:  refill meds.  History of Present Illness: is doing well  still working at replacements  was seeing Dr Patrice Paradise - for her neck DDD and spondylosis (no injections and not bad enough for surgery) pain is on and off-got worse after the weather got cold  indomethacin - 2 pills two times a day of the 25 mg (originally had 50 mg ) or one 50 mg  then flexeril 1 by mouth two times a day  pain is a lot better now - and does not need as often (no longer uses every day ) wants to start getting meds here- and does not need refil yet (goes to [employer])  still drinks some beer- average of 2 beers per day - would like to cut that out  still smokes - about a pack a day   had several days of nosebleed recently  usually watches diet very closely for sugar - occ slips (for DM)  has hx of high cholesterol -- and initially was exercising more - but unable to do much- likes to ride a bike - is afraid to get back on her bike  Dr Patrice Paradise did not put her on any work restrictions  today has eaten oatmeal and chocolate cookie  just started some vitamins (equate for women ) had dexa recently at work- was in the normal range   has not had a mammogram this year - last one was 2008, and wanted to get every other year due to cost  last pap was with Dr Zigmund Daniel - gyn- unsure how long ago                 Current Allergies (reviewed today): No known allergies   Past Medical History:    Breast cancer, hx of    Diabetes mellitus, type II    Diverticulosis, colon    cervical deg disc- 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)  alcoholism    tab abuse            ortho-- Patrice Paradise  Past Surgical History:    Reviewed history from 04/15/2007 and no changes required:       Lumpectomy       CT liver- fatty liver (02/2000)       Right breast biopsy (12/2001)       Abd [place]- fatty liver (05/2002)       T score heel dexa .3 (06/2003)       Dexa- pos T scores (12/2003)       Colonoscopy       Deg disk disease C-S (04/2005)       nuclear stress test normal 12/08   Family History:    Reviewed history from 12/06/2006 and no changes required:       Father: CHF       Mother:        Siblings: 3 brothers, 1 sister  Social History:    Marital Status: divorced  Children: 1    Occupation: [employer]    alcohol intake mod     smoker        Review of Systems  General      Denies fatigue, fever, loss of appetite, and malaise.  Eyes      Denies blurring and eye irritation.  CV      Denies chest pain or discomfort, lightheadness, palpitations, and shortness of breath with exertion.  Resp      Denies cough, pleuritic, and wheezing.  GI      Denies abdominal pain and change in bowel habits.  MS      Complains of stiffness.      Denies loss of strength and cramps.  Derm      Denies itching, lesion(s), and rash.  Neuro      Denies numbness and tingling.  Psych      mood is generally good   Endo      Denies excessive thirst and excessive urination.   Physical Exam  General:     slim and generally well appearing Head:     normocephalic, atraumatic, and no abnormalities observed.   Eyes:     vision grossly intact, pupils equal, pupils round, and pupils reactive to light.  no conjunctival pallor, injection or icterus  Nose:     no nasal discharge.   Mouth:     pharynx pink and moist.   Neck:     tender over lower CS with full rom and no spasm noted  no JVD or carotid bruits or thyromegally Chest Wall:     No deformities, masses, or tenderness noted. Breasts:     baseline surg change R  breast  no M, skin change or nipple d/c noted and no tenderness  Lungs:     diffusely distant bs scant wheeze on forced exp only Heart:     Normal rate and regular rhythm. S1 and S2 normal without gallop, murmur, click, rub or other extra sounds. Abdomen:     Bowel sounds positive,abdomen soft and non-tender without masses, organomegaly or hernias noted. Msk:     no TS tenderness or acute joint changes  Pulses:     pedal pulses are palpable Extremities:     No clubbing, cyanosis, edema, or deformity noted with normal full range of motion of all joints.   Neurologic:     sensation intact to light touch, gait normal, and DTRs symmetrical and normal.  no tremor Skin:     Intact without suspicious lesions or rashes- fair complexion Cervical Nodes:     No lymphadenopathy noted Inguinal Nodes:     No significant adenopathy Psych:     normal affect, talkative and pleasant  somewhat gaurded when disc alcohol and cig    Impression & Recommendations:  Problem # 1:  DISC DISEASE, CERVICAL (ICD-722.4) Assessment: Unchanged disc the poss side eff of indomethacin and flexeril disc dosing parameters as needed - and agreed to px in future if liver fxn tests are normal and does not mix with alcohol labs today incl cbc and hepatic fxn  Problem # 2:  HYPERLIPIDEMIA (ICD-272.4) Assessment: Unchanged lab today disc avoid sat fat in diet - SA:[id number] biscuit f/u to discuss pt has been resistant to med in past aware of CV  risk for smoking and high chol Orders: Venipuncture (09323) T-Basic Metabolic Panel ([id number]) T-Hepatic Function [phone]) T-Lipid Profile [phone]) T- Hemoglobin A1C (37106-26948)   Problem # 3:  ADVERSE DRUG REACTION (ICD-995.20) Assessment: Comment  Only labs today in light of hx of nosebleed check cbc with platelet (and adv to stop alcohol and use caution with indocin)  Problem # 4:  Hx of FATTY LIVER DISEASE (ICD-571.8) Assessment: Unchanged labs  today known alcohol use- disc risk of otc/px meds  will disc at f/u adv to stop drinking alcohol Orders: Venipuncture (33435) T-Hepatic Function [phone]) T- Hemoglobin A1C (02111-55208) T-CBC w/Diff ([id number])   Problem # 5:  Hx of ALCOHOL ABUSE (ICD-305.00) Assessment: Unchanged adv to stop drinking for multiple health risks pt does not think it is a problem at this time labs today  Problem # 6:  Hx of TOBACCO ABUSE (ICD-305.1) Assessment: Unchanged adv to quit smoking discussed in detail risks of smoking, and possible outcomes including COPD, vascular dz, cancer and also respiratory infections/sinus problems     Problem # 7:  BREAST CANCER, HX OF (ICD-V10.3) Assessment: Comment Only ordered mammogram breast exam done today with no changes adv to quit smoking Orders: Radiology Referral (Radiology)   Problem # 8:  DIABETES MELLITUS, TYPE II (ICD-250.00) Assessment: Unchanged check AIC with labs, wt is stable  disc avoiding simple sugars in diet as well as alcohol disc further at f/u Orders: Venipuncture (49753) T- Hemoglobin A1C (00511-02111) T-CBC w/Diff ([id number])  Labs Reviewed: HgBA1c: 6.1 (04/23/2004)      Complete Medication List: 1)  Cyclobenzaprine Hcl 10 Mg Tabs (Cyclobenzaprine hcl) .... Take 1 tablet by mouth three time a day prn 2)  Indomethacin 50 Mg Caps (Indomethacin) .Marland Kitchen.. 1 by mouth two times a day as needed   Patient Instructions: 1)  take the indomethacin only as needed - maximum of one pill twice daily with food (use caution as this can irritate stomach and cause bleeding -take it with food and stop if stomach upset) 2)  take the flexeril only as needed (caution of sedation) 3)  DO NOT MIX ANY MEDICATION WITH ALCOHOL 4)  labs today  5)  follow up 2-4 weeks for 30 min appt - pap and review labs  6)  we will schedule mammogram at check out  7)  continue multivitamin one pill daily  8)  the current recommendation for calcium intake  is 1200-1500 mg daily with [id number] IU of vitamin D     Prior Medications (reviewed today): CYCLOBENZAPRINE HCL 10 MG  TABS (CYCLOBENZAPRINE HCL) Take 1 tablet by mouth three time a day prn Current Allergies (reviewed today): No known allergies  Current Medications (including changes made in today's visit):  CYCLOBENZAPRINE HCL 10 MG  TABS (CYCLOBENZAPRINE HCL) Take 1 tablet by mouth three time a day prn INDOMETHACIN 50 MG CAPS (INDOMETHACIN) 1 by mouth two times a day as needed

## 2010-05-14 NOTE — Assessment & Plan Note (Signed)
Summary: FOLLOW UP ON LABS CYD   Vital Signs:  Patient profile:   60 year old female Weight:      121 pounds BMI:     20.21 Temp:     98.2 degrees F oral Pulse rate:   88 / minute Pulse rhythm:   regular BP sitting:   112 / 60  (left arm) Cuff size:   regular  Vitals Entered By: Marty Heck CMA (February 18, 2009 8:35 AM) CC: follow-up visit   History of Present Illness: here for f/u of high cholesterol  is feeling good   does not want flu shot  knows she is at risk due to smoking  just does not want one   last check- went up despite diet effort trig 70/ HDL 64 and LDL 180/5 CV risks are high in light of smoking  pt is resistant to medication for this   has really tried with diet  changed diet completely -- baked fish / boils things  is eating a lot of shrimp-- did not know that it was high in chol more fruit and veg  red meat- never eats  never eats fried foods    liver tests nl this check alcohol intake is about the same -- just one beer per day  AIC below 6 and nl gluc 90 this check     Allergies: No Known Drug Allergies  Past History:  Past Surgical History: Last updated: 04/15/2007 Lumpectomy CT liver- fatty liver (02/2000) Right breast biopsy (12/2001) Abd [place]- fatty liver (05/2002) T score heel dexa .3 (06/2003) Dexa- pos T scores (12/2003) Colonoscopy Deg disk disease C-S (04/2005) nuclear stress test normal 12/08  Family History: Last updated: 12/06/2006 Father: CHF Mother:  Siblings: 3 brothers, 1 sister  Social History: Last updated: 06/14/2008 Marital Status: divorced Children: 1 Occupation: [employer] alcohol intake mod  smoker  Risk Factors: Smoking Status: current (12/06/2006)  Past Medical History: Breast cancer, hx of hyperglycemia  Diverticulosis, colon cervical deg disc- 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) alcoholism tab abuse   ortho-- Cohen  Review of Systems General:  Denies fatigue, fever, loss  of appetite, and malaise. Eyes:  Denies blurring and eye pain. CV:  Denies chest pain or discomfort, palpitations, and shortness of breath with exertion. Resp:  Complains of cough; denies shortness of breath and wheezing; chronic smokers cough. GI:  Denies abdominal pain, change in bowel habits, and indigestion. MS:  Complains of joint pain; denies joint redness and joint swelling; occ takes indomethacin and flexeril- needs refil . Derm:  Denies lesion(s), poor wound healing, and rash. Neuro:  Denies numbness. Psych:  mood is ok. Endo:  Denies excessive thirst and excessive urination.  Physical Exam  General:  Well-developed,well-nourished,in no acute distress; alert,appropriate and cooperative throughout examination Head:  normocephalic, atraumatic, and no abnormalities observed.   Eyes:  vision grossly intact, pupils equal, pupils round, pupils reactive to light, and no injection.  , no icterus  Neck:  supple with full rom and no masses or thyromegally, no JVD or carotid bruit  Lungs:  diffusely distant bs scant wheeze on forced exp only overall fair air exch Heart:  Normal rate and regular rhythm. S1 and S2 normal without gallop, murmur, click, rub or other extra sounds. Pulses:  R and L carotid,radial,femoral,dorsalis pedis and posterior tibial pulses are full and equal bilaterally Extremities:  No clubbing, cyanosis, edema, or deformity noted with normal full range of motion of all joints.   Neurologic:  sensation  intact to light touch, gait normal, and DTRs symmetrical and normal.  no tremor  Skin:  Intact without suspicious lesions or rashes Cervical Nodes:  No lymphadenopathy noted Psych:  normal affect, talkative and pleasant    Impression & Recommendations:  Problem # 1:  HYPERLIPIDEMIA (ICD-272.4) Assessment Deteriorated  rev labs/ trends today diet near perfect except for shellfish also active  will start zocor 20 mg daily - rev poss side eff asked to minimize  alcohol as this can aff liver  update if side eff  lab 6 wk then lab and f/u 6 mo  Her updated medication list for this problem includes:    Zocor 20 Mg Tabs (Simvastatin) .Marland Kitchen... 1 by mouth once daily in evening with a low fat snack  Labs Reviewed: SGOT: 27 (02/12/2009)   SGPT: 18 (02/12/2009)   HDL:64.70 (02/12/2009), 63.20 (12/30/2008)  LDL:130 (06/14/2008)  Chol:252 (02/12/2009), 248 (12/30/2008)  Trig:70.0 (02/12/2009), 86.0 (12/30/2008)  Problem # 2:  Hx of ALCOHOL ABUSE (ICD-305.00) Assessment: Comment Only one beer daily per pt - is better   Problem # 3:  Hx of TOBACCO ABUSE (ICD-305.1) Assessment: Comment Only again rev risks of smoking- esp in light of chol and other risk factors  pt is not ready to quit -and frank about that  unfortunately pt also ref flu shot  Problem # 4:  HYPERGLYCEMIA (ICD-790.29) Assessment: Improved has been well controlled now with AIC under 6 will re check with 6 mo labs good diet   Complete Medication List: 1)  Cyclobenzaprine Hcl 10 Mg Tabs (Cyclobenzaprine hcl) .... Take 1 tablet by mouth three time a day prn 2)  Indomethacin 50 Mg Caps (Indomethacin) .Marland Kitchen.. 1 by mouth two times a day as needed 3)  Zocor 20 Mg Tabs (Simvastatin) .Marland Kitchen.. 1 by mouth once daily in evening with a low fat snack  Contraindications/Deferment of Procedures/Staging:    Test/Procedure: FLU VAX    Reason for deferment: patient declined    Patient Instructions: 1)  keep thinking about quitting smoking  2)  start zocor 20 mg 1 pill daily in evening- update me if any problems or side effects  3)  stop eating shrimp  4)  keep watching diet for cholesterol  5)  schedule fasting labs in 6 weeks lipid/ast/alt 272  6)  also schedule fasting labs in 6 months lipid/ast/alt/AIC 272, 250.0 and then follow up Prescriptions: ZOCOR 20 MG TABS (SIMVASTATIN) 1 by mouth once daily in evening with a low fat snack  #30 x 11   Entered and Authorized by:   Allena Earing MD   Signed  by:   Allena Earing MD on 02/18/2009   Method used:   Electronically to        [facility] (retail)       [address]       [place], [place]  [address]       Ph: [phone]       Fax: [phone]   RxID:   [id number] INDOMETHACIN 50 MG CAPS (INDOMETHACIN) 1 by mouth two times a day as needed  #90 x 0   Entered and Authorized by:   Allena Earing MD   Signed by:   Allena Earing MD on 02/18/2009   Method used:   Electronically to        [facility] (retail)       [address]       [place], [place]  [address]  Ph: [phone]       Fax: [phone]   RxID:   [id number] CYCLOBENZAPRINE HCL 10 MG  TABS (CYCLOBENZAPRINE HCL) Take 1 tablet by mouth three time a day prn  #90 x 0   Entered and Authorized by:   Allena Earing MD   Signed by:   Allena Earing MD on 02/18/2009   Method used:   Electronically to        [facility] (retail)       [address]       [place], [place]  [address]       Ph: [phone]       Fax: [phone]   RxID:   [id number]   Prior Medications (reviewed today): Current Allergies: No known allergies

## 2010-05-14 NOTE — Progress Notes (Signed)
Summary: refill request for flexeril  Phone Note Refill Request Message from:  Fax from Pharmacy  Refills Requested: Medication #1:  CYCLOBENZAPRINE HCL 10 MG  TABS Take 1 tablet by mouth three time a day as needed   Last Refilled: 07/29/2009 Faxed request from [facility].  Initial call taken by: Marty Heck CMA, AAMA,  April 08, 2010 8:28 AM  Follow-up for Phone Call        px written on EMR for call in  Follow-up by: Allena Earing MD,  April 08, 2010 9:06 AM  Additional Follow-up for Phone Call Additional follow up Details #1::        Med sent electronically to [facility] as instructed.Ozzie Hoyle LPN  April 08, 7914 11:18 AM     New/Updated Medications: CYCLOBENZAPRINE HCL 10 MG  TABS (CYCLOBENZAPRINE HCL) Take 1 tablet by mouth three time a day as needed Prescriptions: CYCLOBENZAPRINE HCL 10 MG  TABS (CYCLOBENZAPRINE HCL) Take 1 tablet by mouth three time a day as needed  #30 x 1   Entered by:   Ozzie Hoyle LPN   Authorized by:   Allena Earing MD   Signed by:   Ozzie Hoyle LPN on 07/23/6436   Method used:   Electronically to        [facility] (retail)       [address]       [place],   [address]       Ph: [phone]       Fax: [phone]   [place]:   [phone]

## 2010-05-14 NOTE — Assessment & Plan Note (Signed)
Summary: back pain/tsc   Vital Signs:  Patient Profile:   60 Years Old Female Weight:      125.50 pounds Temp:     96.8 degrees F oral Pulse rate:   80 / minute BP sitting:   98 / 60  (right arm)  Vitals Entered By: Lelon Mast (December 21, 2006 2:28 PM)                 Visit Type:  Acute PCP:  [facility]  Chief Complaint:  Back Pain.  History of Present Illness: Patient states since she saw Dr. Glori Bickers last, she has started using her exercise bike again and she thinks she just jumped in too quick and didn't gradually increase her time and speed on the bike. Pain is in lower back both sides and she denies any sx's of UTI. Has been using the Flexeril and the Indomethicin but the pain relief only lasts for a short time. The Indomethicin seems to help more than the Flexeril.  Current Allergies (reviewed today): No known allergies  Updated/Current Medications (including changes made in today's visit):  CYCLOBENZAPRINE HCL 10 MG  TABS (CYCLOBENZAPRINE HCL) Take 1 tablet by mouth three time a day prn INDOMETHACIN 25 MG  CAPS (INDOMETHACIN) Take 1 tablet by mouth two times a day prn   Past Medical History:    Reviewed history from 12/06/2006 and no changes required:       Breast cancer, hx of       Diabetes mellitus, type II       Diverticulosis, colon  Past Surgical History:    Reviewed history from 12/06/2006 and no changes required:       Lumpectomy       CT liver- fatty liver (02/2000)       Right breast biopsy (12/2001)       Abd [place]- fatty liver (05/2002)       T score heel dexa .3 (06/2003)       Dexa- pos T scores (12/2003)       Colonoscopy       Deg disk disease C-S (04/2005)   Family History:    Reviewed history from 12/06/2006 and no changes required:       Father: CHF       Mother:        Siblings: 3 brothers, 1 sister  Social History:    Reviewed history from 12/06/2006 and no changes required:       Marital Status: divorced       Children: 1  Occupation: [employer]    Review of Systems  The patient denies chest pain, syncope, dyspnea on exhertion, peripheral edema, abdominal pain, severe indigestion/heartburn, and hematuria.         Difficulty walking and she has pain in her lower back.   Physical Exam  General:     alert, well-developed, well-nourished, and well-hydrated.   Neck:     supple and full ROM.   Lungs:     normal respiratory effort, no accessory muscle use, and normal breath sounds.   Heart:     normal rate and regular rhythm.   Abdomen:     soft, non-tender, and normal bowel sounds.   Msk:     Straight leg raise + . LEFT > RIGHT. Plantar and dorsi-flexes with no pain  When patient gets up to ambulate, it is very painful for her to even move and she does it very slowly. Extremities:     No edema  noted. Neurologic:     alert & oriented X3 and cranial nerves III-XII intact.    Skin:     turgor normal, color normal, and no rashes.   Psych:     Oriented X3, memory intact for recent and remote, normally interactive, good eye contact, and slightly anxious.      Impression & Recommendations:  Problem # 1:  LUMBOSACRAL STRAIN (ICD-846.0) Patient has Indomethicin 25 mg for as needed use. Take the Indomethicin 25 mg twice a day until she sees Dr. Glori Bickers next week. Vicodin 5/500- Take 1-2 tabs every 8 hours as needed for pain. Use ice to lower back intermittently x 48-72 hours and then switch to heat. May use the Flexeril as needed for spasms only.  Problem # 2:  Hx of TOBACCO ABUSE (ICD-305.1) Patient still smoking. Has stopped the Chantix. The following medications were removed from the medication list:    Chantix Starting Month Pak 0.5 Mg X 11 & 1 Mg X 42 Misc (Varenicline tartrate) .Marland Kitchen... Take by mouth as directed    Chantix 1 Mg Tabs (Varenicline tartrate) .Marland Kitchen... 1 by mouth two times a day to start after starter pack   Complete Medication List: 1)  Cyclobenzaprine Hcl 10 Mg Tabs (Cyclobenzaprine  hcl) .... Take 1 tablet by mouth three time a day prn 2)  Indomethacin 25 Mg Caps (Indomethacin) .... Take 1 tablet by mouth two times a day prn   Patient Instructions: 1)  Take the Indomethicin 25 mg twice a day until she sees Dr. Glori Bickers next week. 2)  Vicodin 5/500- Take 1-2 tabs every 8 hours as needed for pain. 3)  Use ice to lower back intermittently x 48-72 hours and then switch to heat. 4)  May use the Flexeril as needed for spasms only. 5)  Cancel Friday appointment with Dr. Glori Bickers and re-schedule for next week mid or late in the week.    ] Prior Medications (reviewed today): Current Allergies (reviewed today): No known allergies  Current Medications (including changes made in today's visit):  CYCLOBENZAPRINE HCL 10 MG  TABS (CYCLOBENZAPRINE HCL) Take 1 tablet by mouth three time a day prn INDOMETHACIN 25 MG  CAPS (INDOMETHACIN) Take 1 tablet by mouth two times a day prn

## 2010-05-14 NOTE — Progress Notes (Signed)
Summary: ??from pharmacy  Phone Note Call from Patient   Caller: Amy from [facility] Call For: Allena Earing MD Summary of Call: Call from Amy at [facility]- Patient's rx for the flexeril was written for 30 on 04-08-10,  but patient is supposed to take this 3 times a day. Was this supposed to be for 69. Please advise.  Initial call taken by: Lacretia Nicks,  April 27, 2010 4:40 PM  Follow-up for Phone Call        thanks for the update please check in with pt to see on average how many of these she takes per day and then I will refil   Follow-up by: Allena Earing MD,  April 27, 2010 5:07 PM  Additional Follow-up for Phone Call Additional follow up Details #1::        Attempted to call patient. Number I have in chart is her work #. Will try to call back tomorrow and reach the patient. Kim Dance CMA Deborra Medina)  April 27, 2010 5:09 PM   Spoke with patient. She said she only takes the flexeril normally once a day as needed but she was normally prescribed #90 and they would last her a long time. She had to pay the same amount for #30 that she would have to pay for #90 and just wanted that amount so they would last her longer. I told her I would see what could be worked out and call her back.  Additional Follow-up by: Arnetha Courser CMA Deborra Medina),  April 28, 2010 9:01 AM    Additional Follow-up for Phone Call Additional follow up Details #2::    that is ok with me px written on EMR for call in  Follow-up by: Allena Earing MD,  April 28, 2010 10:44 AM  Additional Follow-up for Phone Call Additional follow up Details #3:: Details for Additional Follow-up Action Taken: Rx called in as directed.  Additional Follow-up by: Arnetha Courser CMA Deborra Medina),  April 28, 2010 12:57 PM

## 2010-05-14 NOTE — Letter (Signed)
Summary: Generic Letter  [place] at [facility]  [address] [place], [place] [address]   Phone: [phone]  Fax: [phone]    06/25/2008    Christine Barton [address] [place], [place]  [address]    Dear Ms. Murguia,    Your mammogram was normal, please repeat screening in one year.     Sincerely,   Environmental education officer at [facility]

## 2010-05-14 NOTE — Progress Notes (Signed)
  Phone Note Refill Request Message from:  Pharmacy on April 07, 2007 10:17 AM  Refills Requested: Medication #1:  INDOMETHACIN 25 MG  CAPS Take 1 tablet by mouth two times a day prn. [place]   Method Requested: Fax to [facility] Initial call taken by: Christena Deem,  April 07, 2007 10:17 AM  Follow-up for Phone Call        was already faxed over to pharmacy per Fraser Din thank you Follow-up by: Allena Earing MD,  April 07, 2007 11:45 AM

## 2010-05-14 NOTE — Assessment & Plan Note (Signed)
Summary: FU STRESS TEST AND MRI CS.   Vital Signs:  Patient Profile:   60 Years Old Female Weight:      125 pounds Temp:     97.9 degrees F oral Pulse rate:   88 / minute Pulse rhythm:   regular BP sitting:   132 / 80  (left arm) Cuff size:   regular  Vitals Entered By: Marty Heck (April 18, 2007 4:59 PM)                 PCP:  Corynne Scibilia  Chief Complaint:  Discuss test results.  History of Present Illness: will be seeing orthopedics on the 15th- Dr Patrice Paradise has some severe degenerative change with disc disease and spurring  moderate to severe foraminal stenosis at C6- C7  overall feels better with neck arm is no stronger and no weaker- about the same- can grip and hold on to a can  no new areas of numbness no pain in arm no pain in her neck takes indocin infrequently- and they do not bother her stomach  no more chest discomfort nuclear stress study was normal  drinks 2 beers a day does not think she has ever had a drinking problem- it relaxes her   Current Allergies: No known allergies      Review of Systems      See HPI  General      Denies chills, fatigue, fever, and loss of appetite.  Eyes      Denies blurring.  CV      Denies chest pain or discomfort, palpitations, shortness of breath with exertion, and swelling of feet.  Resp      Denies cough.  MS      Complains of stiffness.      Denies joint redness and joint swelling.  Derm      Denies rash.  Neuro      Complains of numbness.      Denies headaches.  Psych      mood has been fair   Physical Exam  General:     Well-developed,well-nourished,in no acute distress; alert,appropriate and cooperative throughout examination Head:     normocephalic, atraumatic, and no abnormalities observed.   Eyes:     vision grossly intact, pupils equal, pupils round, and pupils reactive to light.   Mouth:     pharynx pink and moist.   Neck:     full  rom with some pain on R tilt no spinous  process tenderness some bilat peri cervical muscle tightness/tenderness Lungs:     diffusely distant bs scant wheeze on forced exp only Heart:     Normal rate and regular rhythm. S1 and S2 normal without gallop, murmur, click, rub or other extra sounds. Msk:     no arm tenderness/swelling  Pulses:     pedal pulses are palpable Extremities:     No clubbing, cyanosis, edema, or deformity noted with normal full range of motion of all joints.   Neurologic:     grip 4/5 on L otherwise symmetric strength and sens to light touch symmetric DTRs no tremor cranial nerves II-XII intact, gait normal, and DTRs symmetrical and normal.   Skin:     Intact without suspicious lesions or rashes Cervical Nodes:     No lymphadenopathy noted Psych:     nl affect    Impression & Recommendations:  Problem # 1:  DISC DISEASE, CERVICAL (ICD-722.4) with some improvement in pain and radicular symptoms MRI shows spinal  stenosis- disc and bony change will f/u with orthopedics mid month- but update if symptoms change before then has nsaid as needed- does not want further pain med  Problem # 2:  CHEST PAIN (ICD-786.50) resolved with neg nuclear stress test  Problem # 3:  Hx of TOBACCO ABUSE (ICD-305.1) pt understands importance of cessation has tried chantix without help is interested in nicotine repl otc disc risks of smoking in detail  Complete Medication List: 1)  Cyclobenzaprine Hcl 10 Mg Tabs (Cyclobenzaprine hcl) .... Take 1 tablet by mouth three time a day prn 2)  Indomethacin 25 Mg Caps (Indomethacin) .... Take 1 tablet by mouth two times a day prn   Patient Instructions: 1)  if you suddenly get more weakness in your arm or numbness in your arm or severe arm or neck pain update me and seek care 2)  keep working on smoking- consider nicotine replacement over the counter 3)  follow up with orthopedics as planned    ]  Appended Document: Orders Update    Clinical Lists Changes   Orders: Added new Service order of Est. Patient Level III (78296) - Signed

## 2010-05-14 NOTE — Progress Notes (Signed)
Summary: Need meds for UTI  Phone Note Call from Patient   Caller: Patient Call For: Allena Earing MD Summary of Call: Pt called, on Friday pt was given medication for UTI., pt says the 1 pill did not take the UTI away totally. She needs another pill... Call Back # [phone] ext [phone].Marland KitchenAllen Norris  September 15, 2009 11:37 AM  Initial call taken by: Allen Norris,  September 15, 2009 11:37 AM  Follow-up for Phone Call        Patient says she was placed ABX at the dentist's office and her yeast infection has not completely cleared up.  I advised her that she would not need to take another Diflucan until she finishes the ABX.  I know you said to f/u if the first course did not take care of it but she says it was a bad yeast infection and she thinks it did not  respond as well as it would have because she started the PCN.  Will you phone in another tablet or does she have to have an OV?  Patient knows you will not be in until Wed.  She says she will not need this until after then anyway. Follow-up by: Christena Deem CMA Deborra Medina),  September 15, 2009 3:07 PM  Additional Follow-up for Phone Call Additional follow up Details #1::        please clarify/ change beginning of note -- talking about yeast infx not uti -- ? correct  px written on EMR for call in - diflucan f/u if not imp  Additional Follow-up by: Allena Earing MD,  September 16, 2009 8:45 AM    Additional Follow-up for Phone Call Additional follow up Details #2::    That is correct.  The reference to UTI is incorrect.  The patient is speaking about a yeast infection.  Rx. sent in.   Patient advised.  Lugene Fuquay CMA (AAMA)  September 16, 2009 9:46 AM   Prescriptions: DIFLUCAN 150 MG TABS (FLUCONAZOLE) 1 by mouth times one for yeast infection  #1 x 0   Entered by:   Christena Deem CMA ([place])   Authorized by:   Allena Earing MD   Signed by:   Christena Deem CMA ([place]) on 09/16/2009   Method used:   Electronically to        [facility]  (retail)       [place]       [place], [place]  [address]       Ph: [phone]       Fax: [phone]   RxID:   [id number] DIFLUCAN 150 MG TABS (FLUCONAZOLE) 1 by mouth times one for yeast infection  #1 x 0   Entered by:   Allena Earing MD   Authorized by:   Ozzie Hoyle LPN   Signed by:   Allena Earing MD on 09/16/2009   Method used:   Telephoned to ...         RxID:   [id number]

## 2010-05-14 NOTE — Assessment & Plan Note (Signed)
Summary: mammo results    Preventive Care Screening  Mammogram:    Date:  12/12/2006    Next Due:  12/2007    Results:  normal

## 2010-05-14 NOTE — Letter (Signed)
Summary: Results Follow up Letter  [place] at [facility]  [address] [place], [place] [address]   Phone: [phone]  Fax: [phone]    08/06/2009 MRN: [id number]    Christine Barton [address] [place], [place]  [address]    Dear Ms. Claar,  The following are the results of your recent test(s):  Test         Result    Pap Smear:        Normal _____  Not Normal _____ Comments: ______________________________________________________ Cholesterol: LDL(Bad cholesterol):         Your goal is less than:         HDL (Good cholesterol):       Your goal is more than: Comments:  ______________________________________________________ Mammogram:        Normal __X___  Not Normal _____ Comments:  Yearly follow up is recommended.   ___________________________________________________________________ Hemoccult:        Normal _____  Not normal _______ Comments:    _____________________________________________________________________ Other Tests:    We routinely do not discuss normal results over the telephone.  If you desire a copy of the results, or you have any questions about this information we can discuss them at your next office visit.   Sincerely,    Marne A. Glori Bickers, M.D.  MAT:lsf

## 2010-05-14 NOTE — Letter (Signed)
Summary: Results Follow up Letter  [place] at [facility]  [address] [place], [place] [address]   Phone: [phone]  Fax: [phone]    12/13/2006 MRN: [id number]  DORTHEA MAINA [address] [place], [place]  [address]  Dear Ms. Hing,  The following are the results of your recent test(s):  Test         Result    Pap Smear:        Normal _____  Not Normal _____ Comments: ______________________________________________________ Cholesterol: LDL(Bad cholesterol):         Your goal is less than:         HDL (Good cholesterol):       Your goal is more than: Comments:  ______________________________________________________ Mammogram:        Normal _____  Not Normal _____ Comments:  ___________________________________________________________________ Hemoccult:        Normal _____  Not normal _______ Comments:    _____________________________________________________________________ Other Tests:    We routinely do not discuss normal results over the telephone.  If you desire a copy of the results, or you have any questions about this information we can discuss them at your next office visit.   Sincerely,

## 2010-05-14 NOTE — Consult Note (Signed)
Summary: [facility]Dr. Patrice Paradise   Imported By: Lanice Shirts 05/22/2007 14:34:22  _____________________________________________________________________  External Attachment:    Type:   Image     Comment:   External Document

## 2010-05-14 NOTE — Letter (Signed)
Summary: Results Follow up Letter  [place] at [facility]  [address] [place], [place] [address]   Phone: [phone]  Fax: [phone]    08/12/2009 MRN: [id number]    KELLYN MCCARY [address] [place], [place]  [address]    Dear Ms. Mcquarrie,  The following are the results of your recent test(s):  Test         Result    Pap Smear:        Normal __X___  Not Normal _____ Comments:   ______________________________________________________ Cholesterol: LDL(Bad cholesterol):         Your goal is less than:         HDL (Good cholesterol):       Your goal is more than: Comments:  ______________________________________________________ Mammogram:        Normal _____  Not Normal _____ Comments:  ___________________________________________________________________ Hemoccult:        Normal _____  Not normal _______ Comments:    _____________________________________________________________________ Other Tests:    We routinely do not discuss normal results over the telephone.  If you desire a copy of the results, or you have any questions about this information we can discuss them at your next office visit.   Sincerely,    Marne A. Glori Bickers, M.D.  MAT:lsf

## 2010-05-14 NOTE — Letter (Signed)
Summary: [place] No Show Letter  [place] at [facility]  [address] [place], [place] [address]   Phone: [phone]  Fax: [phone]    10/28/2009 MRN: [id number]  [place], [place]  [address]   Dear Ms. Devivo,   Our records indicate that you missed your scheduled appointment with ___lab__________________ on __7.19.11__________.  Please contact this office to reschedule your appointment as soon as possible.  It is important that you keep your scheduled appointments with your physician, so we can provide you the best care possible.  Please be advised that there may be a charge for "no show" appointments.    Sincerely,    at [facility]

## 2010-05-14 NOTE — Letter (Signed)
Summary: [place] No Show Letter  [place] at [facility]  [address] [place], [place] [address]   Phone: [phone]  Fax: [phone]    02/01/2007   Dear Christine Barton,   Our records indicate that you missed your scheduled appointment with ___lab_______________ on _10-21-08___________.  Please contact this office to reschedule your appointment as soon as possible.  It is important that you keep your scheduled appointments with your physician, so we can provide you the best care possible.  Please be advised that there may be a charge for "no show" appointments.    Sincerely,   [place] at [facility]

## 2010-05-14 NOTE — Progress Notes (Signed)
Summary: requests letter  Phone Note Call from Patient Call back at Work Phone  Call back at [phone]   Caller: Patient Call For: dr. Glori Bickers Summary of Call: pt asks for letter releasing her from lifting restrictions that were places when she had breast cancer several years ago. asks to be called when letter is ready  Follow-up for Phone Call        I will write note on px pad for release from lifting restrictions and put it on her chart Follow-up by: Allena Earing MD,  October 13, 2006 2:49 PM  Additional Follow-up for Phone Call Additional follow up Details #1::        Advised pt- [facility] for pt to pick up note Additional Follow-up by: Marty Heck,  October 13, 2006 3:13 PM

## 2010-05-14 NOTE — Progress Notes (Signed)
Summary: Cyclobenzaprine  Phone Note Refill Request Message from:  Fax from Pharmacy on July 29, 2009 9:57 AM  Refills Requested: Medication #1:  CYCLOBENZAPRINE HCL 10 MG  TABS Take 1 tablet by mouth three time a day prn  Medication #2:  INDOMETHACIN 50 MG CAPS 1 by mouth two times a day as needed [place]  Phone:   [phone]   Method Requested: Telephone to Pharmacy Initial call taken by: Christena Deem CMA Deborra Medina),  July 29, 2009 9:58 AM    Prescriptions: INDOMETHACIN 50 MG CAPS (INDOMETHACIN) 1 by mouth two times a day as needed  #90 x 0   Entered and Authorized by:   Arnette Norris MD   Signed by:   Arnette Norris MD on 07/29/2009   Method used:   Electronically to        [facility] (retail)       [place]       [place], [place]  [address]       Ph: [phone]       Fax: [phone]   RxID:   [id number] CYCLOBENZAPRINE HCL 10 MG  TABS (CYCLOBENZAPRINE HCL) Take 1 tablet by mouth three time a day prn  #90 x 0   Entered and Authorized by:   Arnette Norris MD   Signed by:   Arnette Norris MD on 07/29/2009   Method used:   Electronically to        [facility] (retail)       [place]       [place], [place]  [address]       Ph: [phone]       Fax: [phone]   RxID:   [id number]

## 2010-05-14 NOTE — Assessment & Plan Note (Signed)
Summary: FOLLOW UP   Vital Signs:  Patient Profile:   60 Years Old Female Weight:      122 pounds Temp:     97.9 degrees F oral Pulse rate:   88 / minute Pulse rhythm:   regular BP sitting:   122 / 88  (left arm) Cuff size:   regular  Vitals Entered By: Marty Heck (December 30, 2006 8:58 AM)                 PCP:  Glori Bickers  Chief Complaint:  Follow up.  History of Present Illness: today needs breast exam (mam was nl) re check L ear- cerumen-used some debrox health mt labs-pt decided against it quite yet- wants to exercise more first  back is healing and getting better temporary rest from the bike- but plans to get back on soon  Current Allergies: No known allergies      Review of Systems      See HPI  General      Denies chills, fatigue, and fever.  Resp      Denies cough and shortness of breath.  GU      no new breast lumps per pt  MS      Complains of low back pain.      back is getting better  Derm      Denies rash.  Psych      mood is ok   Physical Exam  General:     Well-developed,well-nourished,in no acute distress; alert,appropriate and cooperative throughout examination Head:     Normocephalic and atraumatic without obvious abnormalities. No apparent alopecia or balding. Eyes:     vision grossly intact, pupils equal, pupils round, and pupils reactive to light.   Ears:     cerumen imp on L that it totally cleared by simple irrigation canals clear, and TMs clear Mouth:     pharynx pink and moist.   Neck:     No deformities, masses, or tenderness noted.supple, full ROM, no JVD, and no carotid bruits.   Breasts:     R breast s/p sx change with incision and scar tissue no acute changes in either breast- no M or skin change or nipple d/c Lungs:     diffusely distant bs without rales, rhonci or crackles scant wheeze on forced exp Heart:     Normal rate and regular rhythm. S1 and S2 normal without gallop, murmur, click, rub or  other extra sounds. Pulses:     R and L carotid,radial,femoral,dorsalis pedis and posterior tibial pulses are full and equal bilaterally Extremities:     No clubbing, cyanosis, edema, or deformity noted with normal full range of motion of all joints.   Skin:     turgor normal, color normal, and no rashes.   Cervical Nodes:     No lymphadenopathy noted Psych:     nl affect, pleasant today    Impression & Recommendations:  Problem # 1:  CERUMEN IMPACTION (ICD-380.4) on L resolved with simple ear irrigation will use debrox otc prn  Problem # 2:  BREAST CANCER, HX OF (ICD-V10.3) nl exam today nl mam will continue self exams and screening   Problem # 3:  Hx of LIVER FUNCTION TESTS, ABNORMAL (ICD-794.8) pt is interested in getting labs in about a month has abused alcohol in the past now is making attempt at better lifestyle again advised to quit etoh will check hep fxn with lipids, cbc and basic prof  Problem # 4:  Hx of TOBACCO ABUSE (ICD-305.1) pt's plan is to stop smoking with once daily chantix (does not tol two times a day) disc risks of continuing to smoke  Complete Medication List: 1)  Cyclobenzaprine Hcl 10 Mg Tabs (Cyclobenzaprine hcl) .... Take 1 tablet by mouth three time a day prn 2)  Indomethacin 25 Mg Caps (Indomethacin) .... Take 1 tablet by mouth two times a day prn   Patient Instructions: 1)  please schedule fasting labs in about a month fasting lipid profile and hepatic function, cbc with diff and basic met panel 790.4 2)  keep up the good work with diet and exercise 3)  keep working on quitting smoking    ]  Preventive Care Screening     breast exam done 9/08

## 2010-05-14 NOTE — Progress Notes (Signed)
Summary: rx  Phone Note Call from Patient Call back at [phone] x [phone]   Caller: Patient Call For: tower Summary of Call: pt wants to try and stop smoking she request an rx for chantix Initial call taken by: Daralene Milch,  November 17, 2006 10:33 AM  Follow-up for Phone Call        let her know that common side eff incl nausea, strange dreams, and in more rare cases- mood change like depression if she gets depressed- stop it and let me know follow up in about 1-2 mo I put px on EMR start with starter pack, then fill the px for mt- 22m bid Follow-up by: MAllena EaringMD,  November 17, 2006 11:15 AM  Additional Follow-up for Phone Call Additional follow up Details #1::        Advised patient, rx [facility]...................................Marland KitchenRonny BaconChavers  November 17, 2006 11:40 AM     New/Updated Medications: CHANTIX STARTING MONTH PAK 0.5 MG X 11 & 1 MG X 42  MISC (VARENICLINE TARTRATE) take by mouth as directed CHANTIX 1 MG  TABS (VARENICLINE TARTRATE) 1 by mouth two times a day to start after starter pack   Prescriptions: CHANTIX 1 MG  TABS (VARENICLINE TARTRATE) 1 by mouth two times a day to start after starter pack  #60 x 3   Entered and Authorized by:   MAllena EaringMD   Signed by:   NDaralene Milchon 11/17/2006   Method used:   Telephoned to ...         RxID::   [id number] STARTING MONTH PAK 0.5 MG X 11 & 1 MG X 42  MISC (VARENICLINE TARTRATE) take by mouth as directed  #1 pk x 0   Entered and Authorized by:   MAllena EaringMD   Signed by:   NDaralene Milchon 11/17/2006   Method used:   Telephoned to ...         RxID::   [id number]

## 2010-05-14 NOTE — Progress Notes (Signed)
Summary: requests diflucan  Phone Note Call from Patient Call back at Work Phone [phone]   Caller: Patient Call For: Allena Earing MD Summary of Call: Pt states that when she was given her pap results she was told that she had yeast.  She said she didnt have any sxs then but now she does- has itching and burning.  She tried monostat but that didnt help and she is asking if diflucan can be called to [facility]. Initial call taken by: Marty Heck CMA,  September 10, 2009 11:43 AM  Follow-up for Phone Call        px written on EMR for call in f/u if not improved  Follow-up by: Allena Earing MD,  September 10, 2009 12:08 PM  Additional Follow-up for Phone Call Additional follow up Details #1::        Patient Advised.   Medication sent electronically. Additional Follow-up by: Christena Deem CMA ([place]),  September 10, 2009 12:14 PM    New/Updated Medications: DIFLUCAN 150 MG TABS (FLUCONAZOLE) 1 by mouth times one for yeast infection Prescriptions: DIFLUCAN 150 MG TABS (FLUCONAZOLE) 1 by mouth times one for yeast infection  #1 x 0   Entered by:   Christena Deem CMA ([place])   Authorized by:   Allena Earing MD   Signed by:   Christena Deem CMA ([place]) on 09/10/2009   Method used:   Electronically to        [facility] (retail)       [address]       [place], [place]  [address]       Ph: [phone]       Fax: [phone]   RxID:   [id number]

## 2010-06-05 ENCOUNTER — Other Ambulatory Visit: Payer: Self-pay | Admitting: Family Medicine

## 2010-06-05 ENCOUNTER — Ambulatory Visit (INDEPENDENT_AMBULATORY_CARE_PROVIDER_SITE_OTHER): Payer: BC Managed Care – PPO | Admitting: Family Medicine

## 2010-06-05 ENCOUNTER — Ambulatory Visit
Admission: RE | Admit: 2010-06-05 | Discharge: 2010-06-05 | Disposition: A | Discharge status: Home / Self Care | Payer: BC Managed Care – PPO | Source: Ambulatory Visit | Attending: Family Medicine | Admitting: Family Medicine

## 2010-06-05 ENCOUNTER — Encounter: Payer: Self-pay | Admitting: Family Medicine

## 2010-06-05 ENCOUNTER — Ambulatory Visit: Admission: RE | Admit: 2010-06-05 | Payer: BC Managed Care – PPO | Source: Ambulatory Visit

## 2010-06-05 ENCOUNTER — Telehealth: Payer: Self-pay | Admitting: Family Medicine

## 2010-06-05 DIAGNOSIS — R109 Unspecified abdominal pain: Secondary | ICD-10-CM

## 2010-06-05 DIAGNOSIS — L0233 Carbuncle of buttock: Secondary | ICD-10-CM | POA: Insufficient documentation

## 2010-06-05 DIAGNOSIS — R3129 Other microscopic hematuria: Secondary | ICD-10-CM

## 2010-06-05 DIAGNOSIS — J029 Acute pharyngitis, unspecified: Secondary | ICD-10-CM

## 2010-06-05 LAB — CONVERTED CEMR LAB
Bilirubin Urine: NEGATIVE
Casts: 0 /lpf
Glucose, Urine, Semiquant: NEGATIVE
Ketones, urine, test strip: NEGATIVE
Nitrite: NEGATIVE
Specific Gravity, Urine: 1.015
Urine crystals, microscopic: 0 /hpf
Urobilinogen, UA: 0.2
WBC Urine, dipstick: NEGATIVE
Yeast, UA: 0
pH: 5

## 2010-06-06 ENCOUNTER — Encounter: Payer: Self-pay | Admitting: Family Medicine

## 2010-06-08 ENCOUNTER — Telehealth: Payer: Self-pay | Admitting: Family Medicine

## 2010-06-10 ENCOUNTER — Telehealth: Payer: Self-pay | Admitting: Family Medicine

## 2010-06-18 NOTE — Assessment & Plan Note (Signed)
Summary: LEFT SIDE PAIN,ST/CLE   [employer]  PT WANTED TO WAIT AND SEE DR Marybella Ethier   Vital Signs:  Patient profile:   60 year old female Height:      65 inches Weight:      124.25 pounds BMI:     20.75 Temp:     98.2 degrees F oral Pulse rate:   96 / minute Pulse rhythm:   regular BP sitting:   122 / 74  (left arm) Cuff size:   regular  Vitals Entered By: Ozzie Hoyle LPN (June 05, 5496 12:02 PM) CC: Lt side pain on and off 1 month. Pt said hurts worse when moves.Pain level now is 0. ( (On 05/30/10 pt had sorethroat and took Penicillin ?mg for 3 days. Pt has no sorethroat now.)   History of Present Illness: for several months L side pain  worse to move -- comes and goes - worse with movement or lifting  pain is right above her waistline    has micro heme on her ua today never had that before  never had kidney stone before   no rash  no dysuria or frequency or blood in urine   had sore throat last saturday  had 3 days of pcn -- took what she had  throat is not sore  no cold symptoms at all  no fever   also a bump under L buttock - for over a month- a little sore- and does not drain   Allergies (verified): No Known Drug Allergies  Review of Systems General:  Complains of fatigue; denies chills, fever, loss of appetite, and malaise. Eyes:  Denies blurring, discharge, and eye irritation. ENT:  Complains of sore throat; denies earache, hoarseness, nasal congestion, and sinus pressure; actually sore throat is gone now. CV:  Denies chest pain or discomfort, lightheadness, palpitations, and shortness of breath with exertion. Resp:  Denies cough and shortness of breath. GI:  Denies abdominal pain, change in bowel habits, and indigestion. GU:  Denies dysuria and urinary frequency. MS:  Complains of mid back pain; denies cramps, muscle weakness, and stiffness. Derm:  Denies itching, lesion(s), poor wound healing, and rash. Neuro:  Denies numbness and tingling. Psych:  Denies  anxiety and depression. Endo:  Denies cold intolerance, excessive thirst, excessive urination, and heat intolerance.  Physical Exam  General:  Well-developed,well-nourished,in no acute distress; alert,appropriate and cooperative throughout examination VSS  Head:  normocephalic, atraumatic, and no abnormalities observed.   Eyes:  vision grossly intact, pupils equal, pupils round, and pupils reactive to light.  no conjunctival pallor, injection or icterus  Nose:  no nasal discharge.   Mouth:  pharynx pink and moist, no erythema, and no exudates.   Neck:  supple with full rom and no masses or thyromegally, no JVD or carotid bruit  Lungs:  diffusely distant bs  no wheeze today Heart:  Normal rate and regular rhythm. S1 and S2 normal without gallop, murmur, click, rub or other extra sounds. Abdomen:  tender over L flank and LLQ without rebound or gaurding  no change with position no M  soft, normal bowel sounds, no distention, and no masses.   Msk:  no CVA tenderness Extremities:  No clubbing, cyanosis, edema, or deformity noted with normal full range of motion of all joints.   Neurologic:  sensation intact to light touch, gait normal, and DTRs symmetrical and normal.  no tremor  Skin:  2 mm boil on L buttock - non draining - tiny  no  vesicles or rash   Cervical Nodes:  No lymphadenopathy noted Inguinal Nodes:  No significant adenopathy Psych:  normal affect, talkative and pleasant    Impression & Recommendations:  Problem # 1:  FLANK PAIN, LEFT (ICD-789.09) Assessment New with micro hematuria  will check CT of abd and pelvis (kidney stone protocol) to r/o stone or other process  adv to use heat until we know more  Her updated medication list for this problem includes:    Cyclobenzaprine Hcl 10 Mg Tabs (Cyclobenzaprine hcl) .Marland Kitchen... Take 1 tablet by mouth three time a day as needed    Indomethacin 50 Mg Caps (Indomethacin) .Marland Kitchen... 1 by mouth two times a day as  needed  Orders: T-Culture, Urine ([id number]) Specimen Handling (99000) Radiology Referral (Radiology) UA Dipstick W/ Micro (manual) (81000)  Problem # 2:  CARBUNCLE AND FURUNCLE OF BUTTOCK (ICD-680.5) Assessment: New  this is tiny under buttock does not resemble shingles or other process given bactroban oint and adv to use heat as needed  pt advised to update me if symptoms worsen or do not improve   Orders: Prescription Created Electronically [phone])  Problem # 3:  SORE THROAT (ICD-462) Assessment: New resolved totally with nl exam and no remaining sympt after self med with pcn warned against doing this in future  adv to update if sympt occur Her updated medication list for this problem includes:    Indomethacin 50 Mg Caps (Indomethacin) .Marland Kitchen... 1 by mouth two times a day as needed  Complete Medication List: 1)  Cyclobenzaprine Hcl 10 Mg Tabs (Cyclobenzaprine hcl) .... Take 1 tablet by mouth three time a day as needed 2)  Indomethacin 50 Mg Caps (Indomethacin) .Marland Kitchen.. 1 by mouth two times a day as needed 3)  Vitamin D (ergocalciferol) 50000 Unit Caps (Ergocalciferol) .Marland Kitchen.. 1 by mouth once weekly for 12 weeks 4)  Calcium Carbonate-vitamin D 600-400 Mg-unit Tabs (Calcium carbonate-vitamin d) .... Take 1 tablet by mouth once a day 5)  Multivitamins Tabs (Multiple vitamin) .... Take 1 tablet by mouth once a day 6)  Bactroban 2 % Crea (Mupirocin calcium) .... Apply small amount to affected area two times a day  Patient Instructions: 1)  we will refer you for CT scan at check out  2)  use heat on your side when it hurts 3)  update me in meantime if symptoms worsen  4)  update me if your throat worsens- but it looks ok today  5)  use bactroban ointment on boil on your back side  6)  if worse/bigger/ fever or not improving in a week - let me know  Prescriptions: BACTROBAN 2 % CREA (MUPIROCIN CALCIUM) apply small amount to affected area two times a day  #1small x 0   Entered by:    Edwin Dada CMA ([place])   Authorized by:   Allena Earing MD   Signed by:   Allena Earing MD on 06/07/2010   Method used:   Electronically to        [facility] (retail)       [address]       [place], [place]  [address]       Ph: [phone]       Fax: [phone]   RxID:   [id number] NASAL 2 % OINT (MUPIROCIN CALCIUM) apply small amount to affected area two times a day  #1 small x 0   Entered and Authorized by:   Allena Earing MD   Signed by:  Allena Earing MD on 06/05/2010   Method used:   Electronically to        [facility] (retail)       [address]       [place], [place]  [address]       Ph: [phone]       Fax: [phone]   RxID:   [id number]    Orders Added: 1)  T-Culture, Urine [[id number]] 2)  Specimen Handling [99000] 3)  Radiology Referral [Radiology] 4)  Prescription Created Electronically [N0502] 5)  UA Dipstick W/ Micro (manual) [81000] 6)  Est. Patient Level IV [56154]    Current Allergies (reviewed today): No known allergies   Laboratory Results   Urine Tests  Date/Time Received: June 05, 2010 12:06 PM  Date/Time Reported: June 05, 2010 12:06 PM   Routine Urinalysis   Color: yellow Appearance: slightly hazy Glucose: negative   (Normal Range: Negative) Bilirubin: negative   (Normal Range: Negative) Ketone: negative   (Normal Range: Negative) Spec. Gravity: 1.015   (Normal Range: 1.003-1.035) Blood: moderate   (Normal Range: Negative) pH: 5.0   (Normal Range: 5.0-8.0) Protein: trace   (Normal Range: Negative) Urobilinogen: 0.2   (Normal Range: 0-1) Nitrite: negative   (Normal Range: Negative) Leukocyte Esterace: negative   (Normal Range: Negative)  Urine Microscopic WBC/HPF: 0-1 RBC/HPF: many Bacteria/HPF: few to none Mucous/HPF: few Epithelial/HPF: 0-2 Crystals/HPF: 0 Casts/LPF: 0 Yeast/HPF: 0 Other: 0

## 2010-06-18 NOTE — Progress Notes (Signed)
Summary: pain has gotten worse   Phone Note Call from Patient   Caller: Patient Call For: Allena Earing MD Summary of Call: Patient left message on machine in triage stating that her pain that she has been having has gotten worse and she is asking what she should do at this point. That is all she said on the message and I tried returning her call, but got no answer. Left message for her to call me back.  Initial call taken by: Lacretia Nicks,  June 10, 2010 1:53 PM  Follow-up for Phone Call        Patient returned my call, she said that the pain is the same as it was the day she was in for appt only it seems to be a little worse, and it hurt all the time, but mostly when she moves.  Please advise. Lacretia Nicks  June 10, 2010 2:13 PM   Additional Follow-up for Phone Call Additional follow up Details #1::        is she taking the flexeril/ muscle relaxer? - does that help yes- if this is muscle strain that would help try it -- and follow up next week if not improved Additional Follow-up by: Allena Earing MD,  June 10, 2010 4:14 PM    Additional Follow-up for Phone Call Additional follow up Details #2::    Patient notified as instructed by telephone. Pt said she has not tried the flexeril for this problem. Pt wants to know if Dr Glori Bickers wants her to try that?Please advise. Ozzie Hoyle LPN  February 29, 6226 4:27 PM   Patient notified as instructed by telephone. Ozzie Hoyle LPN  February 29, 3335 5:25 PM

## 2010-06-18 NOTE — Progress Notes (Signed)
Summary: regarding bactroban  Phone Note From Pharmacy   Caller: [place] Summary of Call: Pharmacy is asking if ok to change from bactroban  cream, which is brand only, to generic ointment. Initial call taken by: Marty Heck CMA, [place],  June 08, 2010 4:49 PM  Follow-up for Phone Call        that is fine with me px written on EMR for call in  Follow-up by: Allena Earing MD,  June 08, 2010 5:07 PM  Additional Follow-up for Phone Call Additional follow up Details #1::        Medication phoned to  [facility] as instructed. Ozzie Hoyle LPN  June 08, 4965 5:13 PM     New/Updated Medications: BACTROBAN 2 % OINT (MUPIROCIN) apply to affected area two times a day Prescriptions: BACTROBAN 2 % OINT (MUPIROCIN) apply to affected area two times a day  #1 small x 1   Entered and Authorized by:   Allena Earing MD   Signed by:   Allena Earing MD on 06/08/2010   Method used:   Telephoned to ...         RxID:   [id number]

## 2010-06-18 NOTE — Progress Notes (Signed)
Summary: Called report CTof abdomen and pelvis without contrast  Phone Note From Other Clinic   Caller: [place] with [facility] Call For: Dr Loura Pardon Summary of Call: Dr Glori Bickers reviewed CT abdomen and Pelvis without contrast report in EMR and said to tell pt everything looked OK, no kidney stones and after further review we would contact pt with further plan. I spoke with pt and she said she would wait to hear from our office even if it was next week. Initial call taken by: Ozzie Hoyle LPN,  June 06, 7119 3:53 PM  Follow-up for Phone Call        this is very reassuring there is some atherosclerosis seen that is not unexpected with hx of smoking  no kidney stone/ tumor or other finding please ask her how her side pain is  Follow-up by: Allena Earing MD,  June 07, 2010 10:16 AM  Additional Follow-up for Phone Call Additional follow up Details #1::        Patient notified as instructed by telephone. Pt said she had forgot to tell you for the last 2 months she has been drinking 3 gallons of 2% milk per week. Pt said she did that to gain some weight. Pt said she does feel better, less side pain and pain is not as often. Patient said pain level now is 2. Pt said if she developed more pain or fever she would call back.Please advise. Ozzie Hoyle LPN  June 08, 9756 1:38 PM     Additional Follow-up for Phone Call Additional follow up Details #2::    perhaps that much milk is bothering her GI system-- it is a bit excessive try to stick to healthy diet  update me if side pain does not continue to improve-thanks Follow-up by: Allena Earing MD,  June 08, 2010 1:55 PM  Additional Follow-up for Phone Call Additional follow up Details #3:: Details for Additional Follow-up Action Taken: Patient notified as instructed by telephone.  Additional Follow-up by: Ozzie Hoyle LPN,  June 09, 8323 2:29 PM

## 2010-08-28 NOTE — Op Note (Signed)
NAME:  Christine Barton, Christine Barton                         ACCOUNT NO.:  [id number]   MEDICAL RECORD NO.:  [id number]                   PATIENT TYPE:  AMB   LOCATION:  DSC                                  FACILITY:  [facility]   PHYSICIAN:  Edsel Petrin. Dalbert Batman, M.D.             DATE OF BIRTH:  08/26/1950   DATE OF PROCEDURE:  12/08/2001  DATE OF DISCHARGE:  12/08/2001                                 OPERATIVE REPORT   PREOPERATIVE DIAGNOSIS:  Right breast mass.   POSTOPERATIVE DIAGNOSIS:  Right breast mass.   OPERATION PERFORMED:  Excisional biopsy of right breast mass.   SURGEON:  Edsel Petrin. Dalbert Batman, M.D.   OPERATIVE INDICATIONS:  This is a 60 year old white female who underwent  right partial mastectomy and axillary lymph node resection in 1998 by Dr.  Benard Rink.  She had a stage T2, N1 breast cancer.  She was treated with  Adriamycin and Cytoxan followed by Taxol and continues to take Tamoxifen to  the present time.  She has had a recent mammogram and ultrasound which shows  fibrous fatty changes and some thickening beneath the lumpectomy scar.  She  is concerned about a lump in her right breast but is vague about how long it  has been there.  On exam, there is a radially oriented scar laterally and at  the medial aspect of this scar at the areolar margin, there is an about 1.5  cm area of focal thickening in the deep subcutaneous tissue.  There is no  real skin change, no nipple discharge, or change in the nipple.  She is  brought to the operating room for biopsy of this mass to rule out recurrent  breast cancer.   SURGICAL TECHNIQUE:  The patient was placed supine on the operating table.  She was monitored and sedated by the anesthesia department.  The right  breast was prepped and draped in a sterile fashion.  1% Xylocaine with  epinephrine was used as local infiltration anesthetic.  I made a radially  oriented incision in the lateral aspect of the right breast starting at the  areolar  margin and extending laterally through the previous scar.  Dissection was carried down into the subcutaneous and breast tissue and at  the margin of the areola and actually under the areola, was some very dense  whitish tissue.  This was excised fairly completely but with no effort to  make a margin.  I could not tell whether this was intense fibrosis or  possible recurrent cancer.  The specimen was sent to pathology.  Hemostasis  was excellent and achieved with electrocautery.  The wound was irrigated  with saline.  The skin was closed with  interrupted sutures of 3-0 nylon.  A clean bandage was placed and the  patient was taken to the recovery room in stable condition.  Estimated blood  loss was about 10 cc.  Complications were none.  Sponge and instrument  counts were correct.                                               Edsel Petrin. Dalbert Batman, M.D.    HMI/MEDQ  D:  12/08/2001  T:  12/11/2001  Job:  [id number]   cc:   Lennis P. Marko Plume, M.D.  [address] [address]  Fax: [place]. Glori Bickers, M.D. [facility]

## 2011-02-23 ENCOUNTER — Encounter: Payer: Self-pay | Admitting: Family Medicine

## 2011-02-24 ENCOUNTER — Ambulatory Visit (INDEPENDENT_AMBULATORY_CARE_PROVIDER_SITE_OTHER): Payer: BC Managed Care – PPO | Admitting: Family Medicine

## 2011-02-24 ENCOUNTER — Encounter: Payer: Self-pay | Admitting: Family Medicine

## 2011-02-24 VITALS — BP 130/76 | HR 96 | Temp 99.5°F | Ht 65.0 in | Wt 136.8 lb

## 2011-02-24 DIAGNOSIS — N390 Urinary tract infection, site not specified: Secondary | ICD-10-CM

## 2011-02-24 DIAGNOSIS — R350 Frequency of micturition: Secondary | ICD-10-CM

## 2011-02-24 LAB — POCT UA - MICROSCOPIC ONLY

## 2011-02-24 LAB — POCT URINALYSIS DIPSTICK
Bilirubin, UA: NEGATIVE
Glucose, UA: NEGATIVE
Ketones, UA: NEGATIVE
Nitrite, UA: NEGATIVE
Spec Grav, UA: 1.02
Urobilinogen, UA: 0.2
pH, UA: 7.5

## 2011-02-24 MED ORDER — CIPROFLOXACIN HCL 250 MG PO TABS: 250.0000 mg | ORAL_TABLET | Freq: Two times a day (BID) | ORAL | Status: AC

## 2011-02-24 NOTE — Assessment & Plan Note (Signed)
With pos ua and micro for leukocytes (pt does have chronic microhematuria ) tx with cipro for 5 d and update  inst to call if worse or not improving  Pt declines needing pyridium Will plan to get flu shot after this is treated - stressed importance of this

## 2011-02-24 NOTE — Progress Notes (Signed)
Subjective:    Patient ID: Christine Barton, female    DOB: Jul 12, 1950, 60 y.o.   MRN: [id number]  HPI Here for uti symptoms   Is a smoker Hx of micro hematuria in past  Symptoms - since yesterday  Urine is strong - esp in ams with odor  Burns to urinate/ pain No bladder pain No back pain  Not aware of a fever  No blood that she can see in urine  A little nausea in the ams   Has been drinking a lot of milk    ua 1plus leuk and 3 plus blood   Patient Active Problem List  Diagnoses  . UNSPECIFIED VITAMIN D DEFICIENCY  . HYPERLIPIDEMIA  . HYPERKALEMIA  . ALCOHOL ABUSE  . TOBACCO ABUSE  . NEUROPATHY  . DIVERTICULOSIS, COLON  . FATTY LIVER DISEASE  . DISC DISEASE, CERVICAL  . HYPERGLYCEMIA  . LIVER FUNCTION TESTS, ABNORMAL  . ADVERSE DRUG REACTION  . BREAST CANCER, HX OF  . POSTMENOPAUSAL STATUS  . MICROSCOPIC HEMATURIA  . CARBUNCLE AND FURUNCLE OF BUTTOCK  . FLANK PAIN, LEFT  . UTI (lower urinary tract infection)   Past Medical History  Diagnosis Date  . Personal history of malignant neoplasm of breast   . Other abnormal glucose   . Diverticulosis of colon (without mention of hemorrhage)   . Degeneration of cervical intervertebral disc 2006    MRI  . Cervical spondylosis 2006    MRI  . Alcohol abuse, unspecified   . Other chronic nonalcoholic liver disease   . Hyperpotassemia   . Other and unspecified hyperlipidemia   . Nonspecific abnormal results of liver function study   . Microscopic hematuria   . Mononeuritis of unspecified site   . Tobacco use disorder   . Unspecified vitamin D deficiency    Past Surgical History  Procedure Date  . Breast lumpectomy   . Breast biopsy 9/03    Right  . Colonoscopy    History  Substance Use Topics  . Smoking status: Current Everyday Smoker  . Smokeless tobacco: Not on file  . Alcohol Use: Yes     Moderate   Family History  Problem Relation Age of Onset  . Heart failure Father    No Known  Allergies Current Outpatient Prescriptions on File Prior to Visit  Medication Sig Dispense Refill  . Calcium Carbonate-Vitamin D (CALCIUM 600+D) 600-400 MG-UNIT per tablet Take 1 tablet by mouth daily.        . Multiple Vitamin (MULTIVITAMIN) tablet Take 1 tablet by mouth daily.        . cyclobenzaprine (FLEXERIL) 10 MG tablet Take 10 mg by mouth 3 (three) times daily as needed.        . indomethacin (INDOCIN) 50 MG capsule Take 50 mg by mouth 2 (two) times daily with a meal.             Review of Systems Review of Systems  Constitutional: Negative for, appetite change, fatigue and unexpected weight change. pos for low grade fever  Eyes: Negative for pain and visual disturbance.  Respiratory: Negative for cough and shortness of breath.   Cardiovascular: Negative for cp or palpitations    Gastrointestinal: Negative for nausea, diarrhea and constipation.  Genitourinary:pos  for urgency and frequency. no hematuria/ no back pain  Skin: Negative for pallor or rash   Neurological: Negative for weakness, light-headedness, numbness and headaches.  Hematological: Negative for adenopathy. Does not bruise/bleed easily.  Psychiatric/Behavioral: Negative for dysphoric  mood. The patient is not nervous/anxious.          Objective:   Physical Exam  Constitutional: She appears well-developed and well-nourished.  HENT:  Head: Normocephalic and atraumatic.  Mouth/Throat: Oropharynx is clear and moist.  Neck: Normal range of motion. Neck supple.  Cardiovascular: Normal rate, regular rhythm and normal heart sounds.   Pulmonary/Chest: Effort normal and breath sounds normal. No respiratory distress. She has no wheezes.       Diffusely distant bs   Abdominal: Soft. Bowel sounds are normal. She exhibits no distension and no mass. There is no tenderness.  Musculoskeletal: She exhibits no tenderness.       No cva tenderness   Lymphadenopathy:    She has no cervical adenopathy.  Neurological: She is  alert.  Skin: Skin is warm and dry. No rash noted.  Psychiatric: She has a normal mood and affect.          Assessment & Plan:

## 2011-02-24 NOTE — Patient Instructions (Addendum)
Drink lot of water  Take the cipro as directed - go pick it up now  If not improved or increase in fever or any back pain or vomiting - please call We will update when your urine culture returns - 3-4 days  If not improving in 2-3 day call  Ask about flu shot clinics at check out  Keep working on quitting smoking

## 2011-02-27 LAB — URINE CULTURE: Colony Count: 25000

## 2011-03-03 ENCOUNTER — Ambulatory Visit: Payer: BC Managed Care – PPO

## 2011-03-10 ENCOUNTER — Ambulatory Visit (INDEPENDENT_AMBULATORY_CARE_PROVIDER_SITE_OTHER): Payer: BC Managed Care – PPO

## 2011-03-10 DIAGNOSIS — Z23 Encounter for immunization: Secondary | ICD-10-CM

## 2011-05-13 ENCOUNTER — Other Ambulatory Visit: Payer: Self-pay | Admitting: *Deleted

## 2011-05-13 MED ORDER — CYCLOBENZAPRINE HCL 10 MG PO TABS: 10.0000 mg | ORAL_TABLET | Freq: Three times a day (TID) | ORAL | Status: DC | PRN

## 2011-05-13 MED ORDER — INDOMETHACIN 50 MG PO CAPS: 50.0000 mg | ORAL_CAPSULE | Freq: Two times a day (BID) | ORAL | Status: DC

## 2011-05-13 NOTE — Telephone Encounter (Signed)
Will refill electronically  

## 2011-05-17 ENCOUNTER — Telehealth: Payer: Self-pay | Admitting: *Deleted

## 2011-05-17 MED ORDER — INDOMETHACIN 50 MG PO CAPS: 50.0000 mg | ORAL_CAPSULE | Freq: Two times a day (BID) | ORAL | Status: DC

## 2011-05-17 MED ORDER — CYCLOBENZAPRINE HCL 10 MG PO TABS: 10.0000 mg | ORAL_TABLET | Freq: Three times a day (TID) | ORAL | Status: DC | PRN

## 2011-05-17 NOTE — Telephone Encounter (Signed)
[facility] sent fax saying patient gets 90 day supply not 30 day is it okay to send in 90 days?

## 2011-05-17 NOTE — Telephone Encounter (Signed)
Ok will change to 90 days  Will refill electronically

## 2011-05-17 NOTE — Telephone Encounter (Signed)
Will change to 90 day Will refill electronically

## 2011-07-21 ENCOUNTER — Encounter: Payer: Self-pay | Admitting: Family Medicine

## 2011-07-21 ENCOUNTER — Ambulatory Visit (INDEPENDENT_AMBULATORY_CARE_PROVIDER_SITE_OTHER): Payer: BC Managed Care – PPO | Admitting: Family Medicine

## 2011-07-21 VITALS — BP 110/70 | HR 100 | Temp 98.7°F | Ht 65.0 in | Wt 142.2 lb

## 2011-07-21 DIAGNOSIS — Z853 Personal history of malignant neoplasm of breast: Secondary | ICD-10-CM

## 2011-07-21 DIAGNOSIS — R7309 Other abnormal glucose: Secondary | ICD-10-CM

## 2011-07-21 DIAGNOSIS — F17200 Nicotine dependence, unspecified, uncomplicated: Secondary | ICD-10-CM

## 2011-07-21 DIAGNOSIS — Z0000 Encounter for general adult medical examination without abnormal findings: Secondary | ICD-10-CM

## 2011-07-21 DIAGNOSIS — E785 Hyperlipidemia, unspecified: Secondary | ICD-10-CM

## 2011-07-21 DIAGNOSIS — E559 Vitamin D deficiency, unspecified: Secondary | ICD-10-CM

## 2011-07-21 DIAGNOSIS — Z1231 Encounter for screening mammogram for malignant neoplasm of breast: Secondary | ICD-10-CM

## 2011-07-21 MED ORDER — INDOMETHACIN 50 MG PO CAPS: 50.0000 mg | ORAL_CAPSULE | Freq: Two times a day (BID) | ORAL | Status: DC

## 2011-07-21 MED ORDER — CYCLOBENZAPRINE HCL 10 MG PO TABS: 10.0000 mg | ORAL_TABLET | Freq: Three times a day (TID) | ORAL | Status: DC | PRN

## 2011-07-21 NOTE — Assessment & Plan Note (Signed)
Labs today  Disc goals for lipids and reasons to control them Rev labs with pt--past  Rev low sat fat diet in detail

## 2011-07-21 NOTE — Assessment & Plan Note (Signed)
With hx of breast ca in distant past Scheduled annual screening mammogram Nl breast exam today  Encouraged monthly self exams

## 2011-07-21 NOTE — Assessment & Plan Note (Signed)
Sugar today with labs Diet not optimal- disc balanced diet with more fruit and veg

## 2011-07-21 NOTE — Assessment & Plan Note (Signed)
Disc in detail risks of smoking and possible outcomes including copd, vascular/ heart disease, cancer , respiratory and sinus infections  Pt voices understanding  Pt states her quit date is Monday!

## 2011-07-21 NOTE — Progress Notes (Signed)
Subjective:    Patient ID: Christine Barton, female    DOB: 29-Jan-1951, 61 y.o.   MRN: [id number]  HPI  Here for health maintenance exam and to review chronic medical problems   Is having more pain in joints with time - toes and fingers are worse , and neck  Pain in both arms and shoulders  Has not seen anyone for it  Thinks ? Arthritis   Has not had labs  Needs to get those today   Wt is up 6 lb with bmi of 23  Tab status-- is working hard to quit  Has cut back -- and plans to be quit by this coming Monday  Is smoking 2-3 cig per day- feels better and doing ok overall  Has quit before  Will get patches soon if needed  Has noticed more cough and just decided to quit   Alcohol status- 1 drink per day maximum- no cravings   Zoster status-never had it , is interested in the vaccine  Up to date with her imms   mammo 4/11 Is overdue  Needs to get that schedule  Hx of breast ca Self exam -no changes at all   Pap 4/11 Has never had abn pap smear  No new partners  No gyn symptoms  Will stay on 3 year schedule   Vit D- is very good about taking her ca and D  Also mvit Diet is not always balanced  Has not recently exercised - but loves to ride her bike   Had a bone density test less than 5 years ago at the [employer] where she works --was normal   colonosc 8/05- diverticulosis 10 year recall , no hx of polyps Uncle with colon   Patient Active Problem List  Diagnoses  . UNSPECIFIED VITAMIN D DEFICIENCY  . HYPERLIPIDEMIA  . HYPERKALEMIA  . ALCOHOL ABUSE  . TOBACCO ABUSE  . NEUROPATHY  . DIVERTICULOSIS, COLON  . FATTY LIVER DISEASE  . DISC DISEASE, CERVICAL  . HYPERGLYCEMIA  . LIVER FUNCTION TESTS, ABNORMAL  . ADVERSE DRUG REACTION  . BREAST CANCER, HX OF  . POSTMENOPAUSAL STATUS  . MICROSCOPIC HEMATURIA  . CARBUNCLE AND FURUNCLE OF BUTTOCK  . FLANK PAIN, LEFT  . UTI (lower urinary tract infection)  . Routine general medical examination at a health care  facility  . Other screening mammogram   Past Medical History  Diagnosis Date  . Personal history of malignant neoplasm of breast   . Other abnormal glucose   . Diverticulosis of colon (without mention of hemorrhage)   . Degeneration of cervical intervertebral disc 2006    MRI  . Cervical spondylosis 2006    MRI  . Alcohol abuse, unspecified   . Other chronic nonalcoholic liver disease   . Hyperpotassemia   . Other and unspecified hyperlipidemia   . Nonspecific abnormal results of liver function study   . Microscopic hematuria   . Mononeuritis of unspecified site   . Tobacco use disorder   . Unspecified vitamin D deficiency    Past Surgical History  Procedure Date  . Breast lumpectomy   . Breast biopsy 9/03    Right  . Colonoscopy    History  Substance Use Topics  . Smoking status: Current Everyday Smoker  . Smokeless tobacco: Not on file  . Alcohol Use: Yes     Moderate   Family History  Problem Relation Age of Onset  . Heart failure Father    No Known Allergies  Current Outpatient Prescriptions on File Prior to Visit  Medication Sig Dispense Refill  . Calcium Carbonate-Vitamin D (CALCIUM 600+D) 600-400 MG-UNIT per tablet Take 1 tablet by mouth daily.        . Multiple Vitamin (MULTIVITAMIN) tablet Take 1 tablet by mouth daily.            Review of Systems Review of Systems  Constitutional: Negative for fever, appetite change, fatigue and unexpected weight change.  Eyes: Negative for pain and visual disturbance.  Respiratory: Negative for cough and shortness of breath.   Cardiovascular: Negative for cp or palpitations    Gastrointestinal: Negative for nausea, diarrhea and constipation.  Genitourinary: Negative for urgency and frequency.  Skin: Negative for pallor or rash   MSK pos for joint pain / diffuse and neck pain, no swelling Neurological: Negative for weakness, light-headedness, numbness and headaches.  Hematological: Negative for adenopathy. Does not  bruise/bleed easily.  Psychiatric/Behavioral: Negative for dysphoric mood. The patient is not nervous/anxious.          Objective:   Physical Exam  Constitutional: She appears well-developed and well-nourished.  HENT:  Head: Normocephalic and atraumatic.  Right Ear: External ear normal.  Left Ear: External ear normal.  Nose: Nose normal.  Mouth/Throat: Oropharynx is clear and moist.  Eyes: Conjunctivae and EOM are normal. Pupils are equal, round, and reactive to light. Right eye exhibits no discharge. Left eye exhibits no discharge. No scleral icterus.  Neck: Normal range of motion. Neck supple. No JVD present. Carotid bruit is not present. Erythema present. No thyromegaly present.  Cardiovascular: Normal rate, regular rhythm, normal heart sounds and intact distal pulses.  Exam reveals no gallop.   Pulmonary/Chest: Effort normal and breath sounds normal. No respiratory distress. She has no wheezes.  Abdominal: Soft. Bowel sounds are normal. She exhibits no distension, no abdominal bruit and no mass. There is no tenderness.  Genitourinary: No breast swelling, tenderness, discharge or bleeding.       Breast exam: No mass, nodules, thickening, tenderness, bulging, retraction, inflamation, nipple discharge or skin changes noted.  No axillary or clavicular LA.  Chaperoned exam.    Musculoskeletal: Normal range of motion. She exhibits no edema and no tenderness.  Lymphadenopathy:    She has no cervical adenopathy.  Neurological: She is alert. She has normal reflexes. No cranial nerve deficit. She exhibits normal muscle tone. Coordination normal.  Skin: Skin is warm and dry. No rash noted. No erythema. No pallor.  Psychiatric: She has a normal mood and affect.          Assessment & Plan:

## 2011-07-21 NOTE — Assessment & Plan Note (Signed)
Level today  At risk for OP with this and smoking She will get dexa at work health fair upcoming and send me report

## 2011-07-21 NOTE — Patient Instructions (Addendum)
If you are interested in a shingles/zoster vaccine - call your insurance to check on coverage,( you should not get it within 1 month of other vaccines) , then call us for a prescription  for it to take to a pharmacy that gives the shot  We will schedule mammogram at check out  Please do the bone density test at your wellness fair Good luck quitting smoking! Try to get 1200-1500 mg of calcium per day with at least 1000 iu of vitamin D - for bone health Wellness labs today and vitamin D level  If your pain gets worse - come in for a visit

## 2011-07-21 NOTE — Assessment & Plan Note (Signed)
Pt overdue for mammo (screening since more than 10 y post cancer) Nl breast exam with surgical changes on R

## 2011-07-21 NOTE — Assessment & Plan Note (Signed)
Reviewed health habits including diet and exercise and skin cancer prevention Also reviewed health mt list, fam hx and immunizations   Wellness labs today Counseled on smoking cessation

## 2011-07-22 LAB — CBC WITH DIFFERENTIAL/PLATELET
Basophils Absolute: 0.1 10*3/uL (ref 0.0–0.1)
Basophils Relative: 1.3 % (ref 0.0–3.0)
Eosinophils Absolute: 0.1 10*3/uL (ref 0.0–0.7)
Eosinophils Relative: 1.2 % (ref 0.0–5.0)
HCT: 41.4 % (ref 36.0–46.0)
Hemoglobin: 14 g/dL (ref 12.0–15.0)
Lymphocytes Relative: 23.5 % (ref 12.0–46.0)
Lymphs Abs: 1.9 10*3/uL (ref 0.7–4.0)
MCHC: 33.8 g/dL (ref 30.0–36.0)
MCV: 104.5 fl — ABNORMAL HIGH (ref 78.0–100.0)
Monocytes Absolute: 0.4 10*3/uL (ref 0.1–1.0)
Monocytes Relative: 5.2 % (ref 3.0–12.0)
Neutro Abs: 5.6 10*3/uL (ref 1.4–7.7)
Neutrophils Relative %: 68.8 % (ref 43.0–77.0)
Platelets: 192 10*3/uL (ref 150.0–400.0)
RBC: 3.96 Mil/uL (ref 3.87–5.11)
RDW: 14.4 % (ref 11.5–14.6)
WBC: 8.1 10*3/uL (ref 4.5–10.5)

## 2011-07-22 LAB — TSH: TSH: 1.52 u[IU]/mL (ref 0.35–5.50)

## 2011-07-22 LAB — COMPREHENSIVE METABOLIC PANEL
ALT: 131 U/L — ABNORMAL HIGH (ref 0–35)
AST: 166 U/L — ABNORMAL HIGH (ref 0–37)
Albumin: 4.6 g/dL (ref 3.5–5.2)
Alkaline Phosphatase: 94 U/L (ref 39–117)
BUN: 26 mg/dL — ABNORMAL HIGH (ref 6–23)
CO2: 26 mEq/L (ref 19–32)
Calcium: 10.4 mg/dL (ref 8.4–10.5)
Chloride: 98 mEq/L (ref 96–112)
Creatinine, Ser: 0.9 mg/dL (ref 0.4–1.2)
GFR: 67.74 mL/min (ref >60.00)
Glucose, Bld: 95 mg/dL (ref 70–99)
Potassium: 4.5 mEq/L (ref 3.5–5.1)
Sodium: 135 mEq/L (ref 135–145)
Total Bilirubin: 0.4 mg/dL (ref 0.3–1.2)
Total Protein: 8 g/dL (ref 6.0–8.3)

## 2011-07-22 LAB — LIPID PANEL
Cholesterol: 241 mg/dL — ABNORMAL HIGH (ref 0–200)
HDL: 51.1 mg/dL (ref >39.00)
Total CHOL/HDL Ratio: 5
Triglycerides: 242 mg/dL — ABNORMAL HIGH (ref 0.0–149.0)
VLDL: 48.4 mg/dL — ABNORMAL HIGH (ref 0.0–40.0)

## 2011-07-22 LAB — VITAMIN D 25 HYDROXY (VIT D DEFICIENCY, FRACTURES): Vit D, 25-Hydroxy: 49 ng/mL (ref 30–89)

## 2011-07-22 LAB — LDL CHOLESTEROL, DIRECT: Direct LDL: 151.4 mg/dL

## 2011-07-23 ENCOUNTER — Encounter: Payer: Self-pay | Admitting: *Deleted

## 2011-07-28 ENCOUNTER — Ambulatory Visit
Admission: RE | Admit: 2011-07-28 | Discharge: 2011-07-28 | Disposition: A | Discharge status: Home / Self Care | Payer: BC Managed Care – PPO | Source: Ambulatory Visit | Attending: Family Medicine | Admitting: Family Medicine

## 2011-07-28 DIAGNOSIS — Z1231 Encounter for screening mammogram for malignant neoplasm of breast: Secondary | ICD-10-CM

## 2011-07-30 ENCOUNTER — Ambulatory Visit (INDEPENDENT_AMBULATORY_CARE_PROVIDER_SITE_OTHER): Payer: BC Managed Care – PPO | Admitting: Family Medicine

## 2011-07-30 ENCOUNTER — Encounter: Payer: Self-pay | Admitting: Family Medicine

## 2011-07-30 VITALS — BP 110/80 | HR 103 | Temp 98.1°F | Ht 65.0 in | Wt 142.1 lb

## 2011-07-30 DIAGNOSIS — E785 Hyperlipidemia, unspecified: Secondary | ICD-10-CM

## 2011-07-30 DIAGNOSIS — R7401 Elevation of levels of liver transaminase levels: Secondary | ICD-10-CM | POA: Insufficient documentation

## 2011-07-30 DIAGNOSIS — D7589 Other specified diseases of blood and blood-forming organs: Secondary | ICD-10-CM

## 2011-07-30 DIAGNOSIS — R7402 Elevation of levels of lactic acid dehydrogenase (LDH): Secondary | ICD-10-CM | POA: Insufficient documentation

## 2011-07-30 LAB — HEPATIC FUNCTION PANEL
ALT: 138 U/L — ABNORMAL HIGH (ref 0–35)
AST: 136 U/L — ABNORMAL HIGH (ref 0–37)
Albumin: 4.4 g/dL (ref 3.5–5.2)
Alkaline Phosphatase: 86 U/L (ref 39–117)
Bilirubin, Direct: 0 mg/dL (ref 0.0–0.3)
Total Bilirubin: 0.5 mg/dL (ref 0.3–1.2)
Total Protein: 7.7 g/dL (ref 6.0–8.3)

## 2011-07-30 LAB — VITAMIN B12: Vitamin B-12: 492 pg/mL (ref 211–911)

## 2011-07-30 NOTE — Progress Notes (Signed)
Subjective:    Patient ID: Christine Barton, female    DOB: 07/15/1950, 61 y.o.   MRN: [id number]  HPI  Here for f/u of labs   ast /alt are 166 and 131 up from 22 and 15 Does not take any otc medications  Is taking vitamins , one is a mvi for 50 plus , and also ca and vit d  Alcohol - quit Monday     (also quit cigarettes too-- has a nicotine patch too)  Was only drinking one drink per day  Last time she drank more than that / heavily -- ? Drinks more than that on weekend 2-3 per day  No withdrawal symptoms  mcv high at 104.5 Her MVI - has B vitamins in it   Lab Results  Component Value Date   CHOL 241* 07/21/2011   CHOL 223* 07/29/2009   CHOL 252* 02/12/2009   Lab Results  Component Value Date   HDL 51.10 07/21/2011   HDL 72.50 07/29/2009   HDL 64.70 02/12/2009   Lab Results  Component Value Date   LDLCALC 130* 06/14/2008   Lab Results  Component Value Date   TRIG 242.0* 07/21/2011   TRIG 92.0 07/29/2009   TRIG 70.0 02/12/2009   Lab Results  Component Value Date   CHOLHDL 5 07/21/2011   CHOLHDL 3 07/29/2009   CHOLHDL 4 02/12/2009   Lab Results  Component Value Date   LDLDIRECT 151.4 07/21/2011   LDLDIRECT 132.4 07/29/2009   LDLDIRECT 180.5 02/12/2009    Is not eating right for her chol -- loves shrimp (did quit for a while)  Likes [employer] mochas -- also gave them up  Eats beef less than once per month  No fried foods at all  Not enough fruits and veg   Patient Active Problem List  Diagnoses  . UNSPECIFIED VITAMIN D DEFICIENCY  . HYPERLIPIDEMIA  . HYPERKALEMIA  . ALCOHOL ABUSE  . Quit smoking  . NEUROPATHY  . DIVERTICULOSIS, COLON  . FATTY LIVER DISEASE  . DISC DISEASE, CERVICAL  . HYPERGLYCEMIA  . LIVER FUNCTION TESTS, ABNORMAL  . ADVERSE DRUG REACTION  . BREAST CANCER, HX OF  . POSTMENOPAUSAL STATUS  . MICROSCOPIC HEMATURIA  . CARBUNCLE AND FURUNCLE OF BUTTOCK  . FLANK PAIN, LEFT  . UTI (lower urinary tract infection)  . Routine general medical  examination at a health care facility  . Other screening mammogram  . Nonspecific elevation of levels of transaminase or lactic acid dehydrogenase (LDH)  . Macrocytosis   Past Medical History  Diagnosis Date  . Personal history of malignant neoplasm of breast   . Other abnormal glucose   . Diverticulosis of colon (without mention of hemorrhage)   . Degeneration of cervical intervertebral disc 2006    MRI  . Cervical spondylosis 2006    MRI  . Alcohol abuse, unspecified   . Other chronic nonalcoholic liver disease   . Hyperpotassemia   . Other and unspecified hyperlipidemia   . Nonspecific abnormal results of liver function study   . Microscopic hematuria   . Mononeuritis of unspecified site   . Tobacco use disorder   . Unspecified vitamin D deficiency    Past Surgical History  Procedure Date  . Breast lumpectomy   . Breast biopsy 9/03    Right  . Colonoscopy    History  Substance Use Topics  . Smoking status: Current Everyday Smoker  . Smokeless tobacco: Not on file  . Alcohol Use: Yes  Moderate   Family History  Problem Relation Age of Onset  . Heart failure Father    No Known Allergies Current Outpatient Prescriptions on File Prior to Visit  Medication Sig Dispense Refill  . Calcium Carbonate-Vitamin D (CALCIUM 600+D) 600-400 MG-UNIT per tablet Take 1 tablet by mouth daily.        . cyclobenzaprine (FLEXERIL) 10 MG tablet Take 1 tablet (10 mg total) by mouth 3 (three) times daily as needed.  90 tablet  5  . indomethacin (INDOCIN) 50 MG capsule Take 1 capsule (50 mg total) by mouth 2 (two) times daily with a meal. As needed for pain  180 capsule  3  . Multiple Vitamin (MULTIVITAMIN) tablet Take 1 tablet by mouth daily.            Review of Systems Review of Systems  Constitutional: Negative for fever, appetite change, fatigue and unexpected weight change.  Eyes: Negative for pain and visual disturbance.  Respiratory: Negative for cough and shortness of  breath.   Cardiovascular: Negative for cp or palpitations    Gastrointestinal: Negative for nausea, diarrhea and constipation. Neg for abd pain or jaundice  Genitourinary: Negative for urgency and frequency.  Skin: Negative for pallor or rash  or jaundice  Neurological: Negative for weakness, light-headedness, numbness and headaches.  Hematological: Negative for adenopathy. Does not bruise/bleed easily.  Psychiatric/Behavioral: Negative for dysphoric mood. The patient is not nervous/anxious.         Objective:   Physical Exam  Constitutional: She appears well-developed and well-nourished. No distress.  HENT:  Head: Normocephalic and atraumatic.  Mouth/Throat: Oropharynx is clear and moist.  Eyes: Conjunctivae and EOM are normal. Pupils are equal, round, and reactive to light. No scleral icterus.       No icterus   Neck: Normal range of motion. Neck supple. No JVD present. No thyromegaly present.  Cardiovascular: Normal rate, regular rhythm, normal heart sounds and intact distal pulses.  Exam reveals no gallop.   Pulmonary/Chest: Effort normal and breath sounds normal. No respiratory distress. She has no wheezes. She has no rales.       Diffusely distant bs   Abdominal: Soft. Bowel sounds are normal. She exhibits no distension. There is no tenderness.  Musculoskeletal: Normal range of motion. She exhibits no edema and no tenderness.  Lymphadenopathy:    She has no cervical adenopathy.  Neurological: She is alert. She has normal reflexes. She displays no tremor. No cranial nerve deficit. She exhibits normal muscle tone. Coordination normal.  Skin: Skin is warm and dry. No rash noted. No erythema. No pallor.       No jaundice   Psychiatric: She has a normal mood and affect.          Assessment & Plan:

## 2011-07-30 NOTE — Assessment & Plan Note (Signed)
?   If alcohol related or other  B12 level today

## 2011-07-30 NOTE — Assessment & Plan Note (Signed)
Disc goals for lipids and reasons to control them Rev labs with pt Rev low sat fat diet in detail They are up  Will work on diet and make f/u after lab today  Will hold off on statin as long as transaminases are high

## 2011-07-30 NOTE — Patient Instructions (Addendum)
More liver labs today Good job with quitting smoking and alcohol- keep it up  Stay away from over the counter medicines (vitamins you are on are ok)  Avoid red meat/ fried foods/ egg yolks/ fatty breakfast meats/ butter, cheese and high fat dairy/ and shellfish

## 2011-07-30 NOTE — Assessment & Plan Note (Addendum)
This is worse with ast/alt more than tripling since last visit  Also inc mcv and trig - highly suspect pt was drinking more alcohol than she admitted to  Quit on Monday-no w/d symptom CT 2/12 ok  Consider abd [place]  Re check hep fxn today, also B12 level and hepatitis prof and update  Also quit smoking- congrat on that

## 2011-08-02 ENCOUNTER — Encounter: Payer: Self-pay | Admitting: *Deleted

## 2011-08-02 LAB — HEPATITIS PANEL, ACUTE
HCV Ab: NEGATIVE
Hep A IgM: NEGATIVE
Hep B C IgM: NEGATIVE
Hepatitis B Surface Ag: NEGATIVE

## 2011-08-19 ENCOUNTER — Telehealth: Payer: Self-pay

## 2011-08-19 NOTE — Telephone Encounter (Signed)
I will see her then

## 2011-08-19 NOTE — Telephone Encounter (Signed)
For 2 months on and off lt upper arm pain and chest pain (not at the same time). Pt seen 07/28/11 and discussed briefly with Dr Glori Bickers but pt said would like EKG done. No  N&V, no sweating. No chest pain or arm pain now.(Pt only wants to see Dr Glori Bickers) Pt scheduled appt to see Dr Glori Bickers 08/20/11 and if symptoms reappear pt to go to [facility] or ER for evaluation. Pt verbalized understanding.

## 2011-08-20 ENCOUNTER — Encounter: Payer: Self-pay | Admitting: Family Medicine

## 2011-08-20 ENCOUNTER — Ambulatory Visit (INDEPENDENT_AMBULATORY_CARE_PROVIDER_SITE_OTHER): Payer: BC Managed Care – PPO | Admitting: Family Medicine

## 2011-08-20 DIAGNOSIS — Z23 Encounter for immunization: Secondary | ICD-10-CM

## 2011-08-20 DIAGNOSIS — M25512 Pain in left shoulder: Secondary | ICD-10-CM | POA: Insufficient documentation

## 2011-08-20 DIAGNOSIS — R079 Chest pain, unspecified: Secondary | ICD-10-CM | POA: Insufficient documentation

## 2011-08-20 DIAGNOSIS — F17200 Nicotine dependence, unspecified, uncomplicated: Secondary | ICD-10-CM

## 2011-08-20 DIAGNOSIS — R12 Heartburn: Secondary | ICD-10-CM | POA: Insufficient documentation

## 2011-08-20 DIAGNOSIS — F1010 Alcohol abuse, uncomplicated: Secondary | ICD-10-CM

## 2011-08-20 DIAGNOSIS — M25519 Pain in unspecified shoulder: Secondary | ICD-10-CM

## 2011-08-20 MED ORDER — RANITIDINE HCL 150 MG PO TABS: 150.0000 mg | ORAL_TABLET | Freq: Two times a day (BID) | ORAL | Status: DC

## 2011-08-20 NOTE — Progress Notes (Signed)
Subjective:    Patient ID: Christine Barton, female    DOB: December 18, 1950, 61 y.o.   MRN: [id number]  HPI L arm Monday and Tuesday really hurting  Is in lateral shoulder area -- hurts worse and pops to abduct -- really pulls , also hurts to lie on it in bed  Does have cervical disk dz and takes nsaids  In past had weakness and tingling - no longer has that  No discomfort to move neck    Chest was hurting on wed  occ hurts off and on right in the middle of chest  Is dull in intensity - not pressure Fleeting 1-2 seconds  No sob or sweaty or nauseated  Is can happen during exertion or during rest  Once happened when she was working - picking up a heavy  Did not stop her activity  ? If related to position    No pleuritic pain  Is still smoking - wants to try to quit again  "did not make it" - after quitting 2 weeks  Does not know what else would have calmed her down - that was a stressful situation  Also is back to drinking - states one drink per night Declines any kind of counseling or 12 step program for it - thinks she can quit if she wants to  Wants to re schedule her labs- her liver labs were elevated and she does not want to re check them because she is back to drinking  ? If in denial   She is also having heartburn almost every time she eats- this is new  No regurgitation ? If related to cp No n/v/d  No abd pain   Lab Results  Component Value Date   ALT 138* 07/30/2011   AST 136* 07/30/2011   ALKPHOS 86 07/30/2011   BILITOT 0.5 07/30/2011    Patient Active Problem List  Diagnoses  . UNSPECIFIED VITAMIN D DEFICIENCY  . HYPERLIPIDEMIA  . HYPERKALEMIA  . ALCOHOL ABUSE  . Smoking  . NEUROPATHY  . DIVERTICULOSIS, COLON  . FATTY LIVER DISEASE  . DISC DISEASE, CERVICAL  . HYPERGLYCEMIA  . LIVER FUNCTION TESTS, ABNORMAL  . ADVERSE DRUG REACTION  . BREAST CANCER, HX OF  . POSTMENOPAUSAL STATUS  . MICROSCOPIC HEMATURIA  . CARBUNCLE AND FURUNCLE OF BUTTOCK  . FLANK  PAIN, LEFT  . UTI (lower urinary tract infection)  . Routine general medical examination at a health care facility  . Other screening mammogram  . Nonspecific elevation of levels of transaminase or lactic acid dehydrogenase (LDH)  . Macrocytosis  . Chest pain  . Left shoulder pain  . Heartburn   Past Medical History  Diagnosis Date  . Personal history of malignant neoplasm of breast   . Other abnormal glucose   . Diverticulosis of colon (without mention of hemorrhage)   . Degeneration of cervical intervertebral disc 2006    MRI  . Cervical spondylosis 2006    MRI  . Alcohol abuse, unspecified   . Other chronic nonalcoholic liver disease   . Hyperpotassemia   . Other and unspecified hyperlipidemia   . Nonspecific abnormal results of liver function study   . Microscopic hematuria   . Mononeuritis of unspecified site   . Tobacco use disorder   . Unspecified vitamin D deficiency    Past Surgical History  Procedure Date  . Breast lumpectomy   . Breast biopsy 9/03    Right  . Colonoscopy    History  Substance Use Topics  . Smoking status: Current Everyday Smoker -- 1.0 packs/day    Types: Cigarettes  . Smokeless tobacco: Not on file  . Alcohol Use: Yes     Moderate   Family History  Problem Relation Age of Onset  . Heart failure Father    No Known Allergies Current Outpatient Prescriptions on File Prior to Visit  Medication Sig Dispense Refill  . Calcium Carbonate-Vitamin D (CALCIUM 600+D) 600-400 MG-UNIT per tablet Take 1 tablet by mouth daily.        . cyclobenzaprine (FLEXERIL) 10 MG tablet Take 1 tablet (10 mg total) by mouth 3 (three) times daily as needed.  90 tablet  5  . indomethacin (INDOCIN) 50 MG capsule Take 1 capsule (50 mg total) by mouth 2 (two) times daily with a meal. As needed for pain  180 capsule  3  . Multiple Vitamin (MULTIVITAMIN) tablet Take 1 tablet by mouth daily.        . ranitidine (ZANTAC) 150 MG tablet Take 1 tablet (150 mg total) by  mouth 2 (two) times daily.  60 tablet  11        Review of Systems Review of Systems  Constitutional: Negative for fever, appetite change,  and unexpected weight change.  Eyes: Negative for pain and visual disturbance.  Respiratory: Negative for cough and shortness of breath.   Cardiovascular: Negative for palpitations/ pnd / orthopnea/ edema  Gastrointestinal: Negative for nausea, diarrhea and constipation.pos for heartburn/ no abd pain  Genitourinary: Negative for urgency and frequency.  Skin: Negative for pallor or rash   MSK pos for L shoulder pain upon movement  Neurological: Negative for weakness, light-headedness, numbness and headaches.  Hematological: Negative for adenopathy. Does not bruise/bleed easily.  Psychiatric/Behavioral: Negative for dysphoric mood. The patient is not nervous/anxious.but has tremendous stress          Objective:   Physical Exam  Constitutional: She appears well-developed and well-nourished. No distress.  HENT:  Head: Normocephalic and atraumatic.  Mouth/Throat: No oropharyngeal exudate.  Eyes: Conjunctivae and EOM are normal. Pupils are equal, round, and reactive to light. Right eye exhibits no discharge. Left eye exhibits no discharge. No scleral icterus.  Neck: Normal range of motion. Neck supple. No JVD present. Carotid bruit is not present. No thyromegaly present.  Cardiovascular: Normal rate, regular rhythm, normal heart sounds and intact distal pulses.  Exam reveals no gallop.   No murmur heard. Pulmonary/Chest: Effort normal and breath sounds normal. No respiratory distress. She has no wheezes. She has no rales. She exhibits no tenderness.       Diffusely distant bs   Abdominal: Soft. Bowel sounds are normal. She exhibits no distension, no abdominal bruit and no mass. There is tenderness. There is no rebound and no guarding.       No hsm  Musculoskeletal: She exhibits tenderness. She exhibits no edema.       L shoulder lateral and slt  acromion tenderness Pain to abduct over 90 degrees Pain to externally rotate Neg hawkings/ neer tests Neg bicep tenderness  Nl rom neck No CS tenderness   Lymphadenopathy:    She has no cervical adenopathy.  Neurological: She is alert. She has normal strength and normal reflexes. She displays no atrophy and no tremor. No cranial nerve deficit or sensory deficit. She exhibits normal muscle tone. Coordination normal.  Skin: Skin is warm and dry. No rash noted. No erythema. No pallor.  Psychiatric: Her mood appears anxious. Her affect is blunt.  Her affect is not angry. Her speech is delayed and tangential. She is withdrawn. She expresses no homicidal and no suicidal ideation.       Pt is somewhat timid and quiet today- hesitates for long periods of time before answering questions           Assessment & Plan:

## 2011-08-20 NOTE — Patient Instructions (Signed)
Take zantac 150 mg - I sent px to [facility] -- twice daily for heartburn- this should stop the chest pain , but if it does not, let me know  If heartburn does not improve- also let me know  I feel very strongly that you have and alcohol problem and need to quit - if you need help let me know  When you want to check liver tests again - let me know  Keep trying to quit smoking Find other healthy ways to deal with stress, like walking  I think you may have bursitis of the shoulder- will make you an appt with Dr Lorelei Pont at check out so he can evaluate it further  For now - try to avoid indocin- I think this is making stomach and heartburn worse

## 2011-08-21 NOTE — Assessment & Plan Note (Signed)
Atypical with nl app ekg/ no acute changes  This may have something to do with heartburn and stress tx with zantac and update  Does have cardiac risk factors- low threshold to ref for stress testing

## 2011-08-21 NOTE — Assessment & Plan Note (Signed)
With elevated LFTs - pt is hesitant to admit she has an alcohol problem today, and declines further liver testing at this time Very long discussion about dangers to her health from drinking, and need to quit entirely She declines pursuing any type of therapy or rehab or use of a 12 step program

## 2011-08-21 NOTE — Assessment & Plan Note (Signed)
This is new, and disc poss triggers incl alcohol and smoking  Rev diet Will try zantac 150 bid and update - close f/u

## 2011-08-21 NOTE — Assessment & Plan Note (Signed)
Disc in detail risks of smoking and possible outcomes including copd, vascular/ heart disease, cancer , respiratory and sinus infections  Pt voices understanding  Disc alt ways to deal with stress to prevent smoking , disc quit date of Monday again  Wants to do this on her own

## 2011-08-21 NOTE — Assessment & Plan Note (Signed)
Exam is consistent with bursitis , and recommend against further nsaid now due to heartburn/ cp  Will ref to Dr Lorelei Pont for further eval

## 2011-08-23 ENCOUNTER — Telehealth: Payer: Self-pay | Admitting: Family Medicine

## 2011-08-23 ENCOUNTER — Encounter: Payer: Self-pay | Admitting: Family Medicine

## 2011-08-23 ENCOUNTER — Ambulatory Visit (INDEPENDENT_AMBULATORY_CARE_PROVIDER_SITE_OTHER): Payer: BC Managed Care – PPO | Admitting: Family Medicine

## 2011-08-23 VITALS — BP 130/72 | HR 103 | Temp 97.9°F | Ht 65.0 in | Wt 142.2 lb

## 2011-08-23 DIAGNOSIS — M7550 Bursitis of unspecified shoulder: Secondary | ICD-10-CM

## 2011-08-23 DIAGNOSIS — M75100 Unspecified rotator cuff tear or rupture of unspecified shoulder, not specified as traumatic: Secondary | ICD-10-CM

## 2011-08-23 DIAGNOSIS — IMO0002 Reserved for concepts with insufficient information to code with codable children: Secondary | ICD-10-CM

## 2011-08-23 DIAGNOSIS — M67919 Unspecified disorder of synovium and tendon, unspecified shoulder: Secondary | ICD-10-CM

## 2011-08-23 NOTE — Telephone Encounter (Signed)
The patient has questions about the co-pay that she was required to pay at the second visit due to Dr. Glori Bickers referring her to Dr. Lorelei Pont for a consult.  She questions why she should pay a second co-pay for the visit with Dr. Lorelei Pont if it was a continuation of Dr. Marliss Coots evaluation/visit.  The patient also questions a physical co-pay and stated that her [employer] with her company[employer], does not require a co-pay for physicals.

## 2011-08-24 MED ORDER — ZOSTER VACCINE LIVE 19400 UNT/0.65ML ~~LOC~~ SOLR: 0.6500 mL | Freq: Once | SUBCUTANEOUS | Status: DC

## 2011-08-24 NOTE — Progress Notes (Signed)
Patient Name: Christine Barton Date of Birth: Aug 13, 1950 Medical Record Number: [id number] Gender: female Date of Encounter: 08/23/2011  History of Present Illness:  Christine Barton is a 61 y.o. very pleasant female patient who presents with the following:  Pleasant patient referred by Dr. Glori Bickers for evaluation of LEFT shoulder pain. She's had some intermittent problems with her LEFT shoulder off and on for a number of months, but now over the last few weeks she is has a worsening of her LEFT shoulder and pain with abduction and internal rotation. She occasionally has clicking or popping. No distinct injury, but she has done some repetitive motion opening her sliding glass door which is quite heavy. Historically, she did have to do some repetitive motion activities at work.   She now has a dull ache in her shoulder, and pain in the t-shirt distribution. She denies any significant neck pain. No radiculopathy. She is able to abduct her shoulder.  No prior dislocations or operative intervention in the LEFT shoulder. No history of fracture.  Patient Active Problem List  Diagnoses  . UNSPECIFIED VITAMIN D DEFICIENCY  . HYPERLIPIDEMIA  . HYPERKALEMIA  . ALCOHOL ABUSE  . Smoking  . NEUROPATHY  . DIVERTICULOSIS, COLON  . FATTY LIVER DISEASE  . DISC DISEASE, CERVICAL  . HYPERGLYCEMIA  . LIVER FUNCTION TESTS, ABNORMAL  . ADVERSE DRUG REACTION  . BREAST CANCER, HX OF  . POSTMENOPAUSAL STATUS  . MICROSCOPIC HEMATURIA  . CARBUNCLE AND FURUNCLE OF BUTTOCK  . FLANK PAIN, LEFT  . UTI (lower urinary tract infection)  . Routine general medical examination at a health care facility  . Other screening mammogram  . Nonspecific elevation of levels of transaminase or lactic acid dehydrogenase (LDH)  . Macrocytosis  . Chest pain  . Left shoulder pain  . Heartburn   Past Medical History  Diagnosis Date  . Personal history of malignant neoplasm of breast   . Other abnormal glucose   .  Diverticulosis of colon (without mention of hemorrhage)   . Degeneration of cervical intervertebral disc 2006    MRI  . Cervical spondylosis 2006    MRI  . Alcohol abuse, unspecified   . Other chronic nonalcoholic liver disease   . Hyperpotassemia   . Other and unspecified hyperlipidemia   . Nonspecific abnormal results of liver function study   . Microscopic hematuria   . Mononeuritis of unspecified site   . Tobacco use disorder   . Unspecified vitamin D deficiency    Past Surgical History  Procedure Date  . Breast lumpectomy   . Breast biopsy 9/03    Right  . Colonoscopy    History  Substance Use Topics  . Smoking status: Current Everyday Smoker -- 1.0 packs/day    Types: Cigarettes  . Smokeless tobacco: Not on file  . Alcohol Use: Yes     Moderate   Family History  Problem Relation Age of Onset  . Heart failure Father    No Known Allergies  Medication list has been reviewed and updated.  Review of Systems:  GEN: No fevers, chills. Nontoxic. Primarily MSK c/o today. MSK: Detailed in the HPI GI: tolerating PO intake without difficulty Neuro: No numbness, parasthesias, or tingling associated. Otherwise the pertinent positives of the ROS are noted above.    Physical Examination: Filed Vitals:   08/23/11 1545  BP: 130/72  Pulse: 103  Temp: 97.9 F (36.6 C)  TempSrc: Oral  Height: 5' 5"  (1.651 m)  Weight: 142 lb  3.2 oz (64.501 kg)  SpO2: 99%    Body mass index is 23.66 kg/(m^2).   GEN: Well-developed,well-nourished,in no acute distress; alert,appropriate and cooperative throughout examination HEENT: Normocephalic and atraumatic without obvious abnormalities. Ears, externally no deformities PULM: Breathing comfortably in no respiratory distress EXT: No clubbing, cyanosis, or edema PSYCH: Normally interactive. Cooperative during the interview. Pleasant. Friendly and conversant. Not anxious or depressed appearing. Normal, full affect.  Shoulder:  LEFT Inspection: No muscle wasting or winging Ecchymosis/edema: neg  AC joint, scapula, clavicle: NT Cervical spine: NT, full ROM - essentially full and NT Spurling's: neg Abduction: full, 5/5 Flexion: full, 5/5 IR, full, lift-off: 5/5 ER at neutral: full, 5/5 AC crossover: mild pos Neer: neg Hawkins: pos Drop Test: neg Empty Can: pos Supraspinatus insertion: mild-mod T Bicipital groove: NT Speed's: neg Yergason's: neg Sulcus sign: neg Scapular dyskinesis: none C5-T1 intact  Neuro: Sensation intact Grip 5/5   Assessment and Plan: 1. Tendinopathy of rotator cuff   2. Subacromial bursitis     Classic rotator cuff tendinopathy and subacromial bursitis from left-sided impingement.  We'll avoid NSAIDs in this patient with some ongoing alcohol abuse issues. Recommended rotator cuff strengthening, and scapular stabilization. Reviewed a home exercise program from [facility]. Recommended formal physical therapy, which I think is of great benefit to understand rotator cuff training and scapular control, but the patient may be unable to do this secondary to finances.  Subacromial injection for pain control. I emphasized the importance of continuing to do her rehabilitation program over the next few months.  SubAC Injection, L Verbal consent was obtained from the patient. Risks (including infection), benefits, and alternatives were explained. Patient prepped with Chloraprep and Ethyl Chloride used for anesthesia. The subacromial space was injected using the posterior approach. The patient tolerated the procedure well and had decreased pain post injection. No complications. Injection: 9 cc of Lidocaine 1% and 1cc of Depo-Medrol 40 mg. Needle: 22 gauge  She will f/u with me in 2 months if not improved

## 2011-08-30 ENCOUNTER — Other Ambulatory Visit: Payer: BC Managed Care – PPO

## 2011-09-17 NOTE — Telephone Encounter (Signed)
Provided patient with information printed from the e-verification about [employer] coverage and that there is a co-pay for services at the PCP at one of her last office visits.  I also explained that there are co-pays for each visit and that Dr. Glori Bickers had provided the initial assessment and then referred to Dr. Lorelei Pont for further assessment and treatment/options and since these were two separate office visits that her insurance requires two separate co-pays. Patient appreciated me researching and said she would follow up with her company about the co-pay to be sure she understood her coverage.  She understood why there was a co-pay at each visit after our discussion; however, did not necessarily agree with it.  She was very pleasant and appreciative.

## 2011-09-20 ENCOUNTER — Other Ambulatory Visit (INDEPENDENT_AMBULATORY_CARE_PROVIDER_SITE_OTHER): Payer: BC Managed Care – PPO

## 2011-09-20 DIAGNOSIS — R748 Abnormal levels of other serum enzymes: Secondary | ICD-10-CM

## 2011-09-21 LAB — HEPATIC FUNCTION PANEL
ALT: 22 U/L (ref 0–35)
AST: 23 U/L (ref 0–37)
Albumin: 3.8 g/dL (ref 3.5–5.2)
Alkaline Phosphatase: 72 U/L (ref 39–117)
Bilirubin, Direct: 0.1 mg/dL (ref 0.0–0.3)
Total Bilirubin: 0.4 mg/dL (ref 0.3–1.2)
Total Protein: 6.9 g/dL (ref 6.0–8.3)

## 2011-09-23 ENCOUNTER — Encounter: Payer: Self-pay | Admitting: *Deleted

## 2011-09-27 NOTE — Progress Notes (Signed)
  Dance, Janalyn Harder, Rico More Detail >>      Ignacia Marvel, CMA        Sent: Mon September 27, 2011  3:48 PM    To: Rockwell Germany, CMA        DOTTY GONZALO    MRN: [id number] DOB: Jan 12, 1951     Pt Work: [employer]: [phone]           Message     Patient notified of lab results.

## 2012-08-21 ENCOUNTER — Other Ambulatory Visit: Payer: Self-pay | Admitting: *Deleted

## 2012-08-21 NOTE — Telephone Encounter (Signed)
Received faxed refill request from pharmacy.  Last office visit 08/23/11. Is it okay to refill medication?

## 2012-08-21 NOTE — Telephone Encounter (Signed)
Please schedule f/u appt and refil until then thanks

## 2012-08-22 ENCOUNTER — Other Ambulatory Visit: Payer: Self-pay | Admitting: Family Medicine

## 2012-08-22 NOTE — Telephone Encounter (Signed)
Called patient to schedule an appointment. Was advised by patient that she will check her schedule and call back.

## 2012-08-22 NOTE — Telephone Encounter (Signed)
Ok to refill 

## 2012-08-22 NOTE — Telephone Encounter (Signed)
Please schedule a f/u in June and refil until then

## 2012-08-23 NOTE — Telephone Encounter (Signed)
Left voicemail requesting pt to call office at work #, called cell # and it was wrong #

## 2012-08-28 NOTE — Telephone Encounter (Signed)
appt scheduled for 10/10/12 and meds refilled

## 2012-08-28 NOTE — Telephone Encounter (Signed)
appt scheduled and meds refilled until then

## 2012-10-10 ENCOUNTER — Ambulatory Visit: Payer: BC Managed Care – PPO | Admitting: Family Medicine

## 2012-10-10 ENCOUNTER — Ambulatory Visit (INDEPENDENT_AMBULATORY_CARE_PROVIDER_SITE_OTHER): Payer: PRIVATE HEALTH INSURANCE | Admitting: Family Medicine

## 2012-10-10 ENCOUNTER — Encounter: Payer: Self-pay | Admitting: Family Medicine

## 2012-10-10 VITALS — BP 128/78 | HR 120 | Temp 98.5°F | Ht 65.0 in | Wt 133.5 lb

## 2012-10-10 DIAGNOSIS — F17200 Nicotine dependence, unspecified, uncomplicated: Secondary | ICD-10-CM

## 2012-10-10 DIAGNOSIS — R000 Tachycardia, unspecified: Secondary | ICD-10-CM | POA: Insufficient documentation

## 2012-10-10 DIAGNOSIS — K7689 Other specified diseases of liver: Secondary | ICD-10-CM

## 2012-10-10 DIAGNOSIS — F1010 Alcohol abuse, uncomplicated: Secondary | ICD-10-CM

## 2012-10-10 DIAGNOSIS — R7309 Other abnormal glucose: Secondary | ICD-10-CM

## 2012-10-10 DIAGNOSIS — E785 Hyperlipidemia, unspecified: Secondary | ICD-10-CM

## 2012-10-10 LAB — COMPREHENSIVE METABOLIC PANEL
ALT: 44 U/L — ABNORMAL HIGH (ref 0–35)
AST: 47 U/L — ABNORMAL HIGH (ref 0–37)
Albumin: 4.6 g/dL (ref 3.5–5.2)
Alkaline Phosphatase: 80 U/L (ref 39–117)
BUN: 6 mg/dL (ref 6–23)
CO2: 22 mEq/L (ref 19–32)
Calcium: 10.1 mg/dL (ref 8.4–10.5)
Chloride: 103 mEq/L (ref 96–112)
Creatinine, Ser: 0.6 mg/dL (ref 0.4–1.2)
GFR: 103.73 mL/min (ref >60.00)
Glucose, Bld: 116 mg/dL — ABNORMAL HIGH (ref 70–99)
Potassium: 4 mEq/L (ref 3.5–5.1)
Sodium: 135 mEq/L (ref 135–145)
Total Bilirubin: 0.7 mg/dL (ref 0.3–1.2)
Total Protein: 8.4 g/dL — ABNORMAL HIGH (ref 6.0–8.3)

## 2012-10-10 LAB — CBC WITH DIFFERENTIAL/PLATELET
Basophils Absolute: 0 10*3/uL (ref 0.0–0.1)
Basophils Relative: 0.5 % (ref 0.0–3.0)
Eosinophils Absolute: 0.1 10*3/uL (ref 0.0–0.7)
Eosinophils Relative: 1.4 % (ref 0.0–5.0)
HCT: 47.9 % — ABNORMAL HIGH (ref 36.0–46.0)
Hemoglobin: 16.2 g/dL — ABNORMAL HIGH (ref 12.0–15.0)
Lymphocytes Relative: 34.9 % (ref 12.0–46.0)
Lymphs Abs: 2.4 10*3/uL (ref 0.7–4.0)
MCHC: 33.9 g/dL (ref 30.0–36.0)
MCV: 101.8 fl — ABNORMAL HIGH (ref 78.0–100.0)
Monocytes Absolute: 0.5 10*3/uL (ref 0.1–1.0)
Monocytes Relative: 8.1 % (ref 3.0–12.0)
Neutro Abs: 3.7 10*3/uL (ref 1.4–7.7)
Neutrophils Relative %: 55.1 % (ref 43.0–77.0)
Platelets: 246 10*3/uL (ref 150.0–400.0)
RBC: 4.7 Mil/uL (ref 3.87–5.11)
RDW: 13.8 % (ref 11.5–14.6)
WBC: 6.8 10*3/uL (ref 4.5–10.5)

## 2012-10-10 LAB — TSH: TSH: 1.61 u[IU]/mL (ref 0.35–5.50)

## 2012-10-10 LAB — LIPID PANEL
Cholesterol: 234 mg/dL — ABNORMAL HIGH (ref 0–200)
HDL: 57.4 mg/dL (ref >39.00)
Total CHOL/HDL Ratio: 4
Triglycerides: 208 mg/dL — ABNORMAL HIGH (ref 0.0–149.0)
VLDL: 41.6 mg/dL — ABNORMAL HIGH (ref 0.0–40.0)

## 2012-10-10 LAB — LDL CHOLESTEROL, DIRECT: Direct LDL: 145.1 mg/dL

## 2012-10-10 LAB — HEMOGLOBIN A1C: Hgb A1c MFr Bld: 7.2 % — ABNORMAL HIGH (ref 4.6–6.5)

## 2012-10-10 NOTE — Patient Instructions (Addendum)
Labs today  Keep thinking about quitting smoking Call your insurance company and find out if a Cat Scan of your lungs would be covered for lung cancer screening - since you are a smoker  Eat a healthy diet Cut alcohol to 1 drink per day maximum for liver health Schedule a physical late summer or early fall - (30 min visit) - and we will review labs in detail and health mt list and check pulse rate again

## 2012-10-10 NOTE — Assessment & Plan Note (Signed)
Pt states she was active this am  Will check tsh Re check at f/u  ? If related to alcohol use

## 2012-10-10 NOTE — Assessment & Plan Note (Signed)
Better than in the past - but counseled pt to cut back to 1 drink per day maximum (or quit if she feels like she is addicted to it)

## 2012-10-10 NOTE — Progress Notes (Signed)
Subjective:    Patient ID: Christine Barton, female    DOB: 19-Nov-1950, 62 y.o.   MRN: [id number]  HPI Here for f/u for chronic medical problems   Has lost some weight - 9 lb since last visit  Has cut back on eating - is harder to loose than it used to be  BMI 22  Exercises sporatically  Stress at work - they are laying off a lot of people   Smoking - is the same , about 1 ppd  Not ready to quit yet  Can feel it a bit in her breathing   Alcohol - is better with intake now also  Does not drink every day  Drinks beer 3-4 days per week (2 beers)  Does not feel like she is dependent on it She did quit drinking for 6 mo prior to that   Needs labs - to check sugar and  choleaterol   Patient Active Problem List   Diagnosis Date Noted  . Chest pain 08/20/2011  . Left shoulder pain 08/20/2011  . Chronic heartburn 08/20/2011  . Elevated aspartate aminotransferase level 07/30/2011  . Macrocytosis 07/30/2011  . Routine general medical examination at a health care facility 07/21/2011  . Other screening mammogram 07/21/2011  . UTI (lower urinary tract infection) 02/24/2011  . MICROSCOPIC HEMATURIA 06/05/2010  . CARBUNCLE AND FURUNCLE OF BUTTOCK 06/05/2010  . FLANK PAIN, LEFT 06/05/2010  . UNSPECIFIED VITAMIN D DEFICIENCY 08/04/2009  . HYPERKALEMIA 08/04/2009  . POSTMENOPAUSAL STATUS 08/04/2009  . HYPERGLYCEMIA 02/18/2009  . HYPERLIPIDEMIA 06/14/2008  . ADVERSE DRUG REACTION 06/14/2008  . [place] DISEASE, CERVICAL 03/30/2007  . ALCOHOL ABUSE 12/06/2006  . Smoking 12/06/2006  . NEUROPATHY 12/06/2006  . DIVERTICULOSIS, COLON 12/06/2006  . FATTY LIVER DISEASE 12/06/2006  . LIVER FUNCTION TESTS, ABNORMAL 12/06/2006  . BREAST CANCER, HX OF 12/06/2006   Past Medical History  Diagnosis Date  . Personal history of malignant neoplasm of breast   . Other abnormal glucose   . Diverticulosis of colon (without mention of hemorrhage)   . Degeneration of cervical intervertebral disc 2006     MRI  . Cervical spondylosis 2006    MRI  . Alcohol abuse, unspecified   . Other chronic nonalcoholic liver disease   . Hyperpotassemia   . Other and unspecified hyperlipidemia   . Nonspecific abnormal results of liver function study   . Microscopic hematuria   . Mononeuritis of unspecified site   . Tobacco use disorder   . Unspecified vitamin D deficiency    Past Surgical History  Procedure Laterality Date  . Breast lumpectomy    . Breast biopsy  9/03    Right  . Colonoscopy     History  Substance Use Topics  . Smoking status: Current Every Day Smoker -- 1.00 packs/day    Types: Cigarettes  . Smokeless tobacco: Not on file  . Alcohol Use: Yes     Comment: Moderate   Family History  Problem Relation Age of Onset  . Heart failure Father    No Known Allergies Current Outpatient Prescriptions on File Prior to Visit  Medication Sig Dispense Refill  . Calcium Carbonate-Vitamin D (CALCIUM 600+D) 600-400 MG-UNIT per tablet Take 1 tablet by mouth daily.        . cyclobenzaprine (FLEXERIL) 10 MG tablet TAKE ONE (1) TABLET BY MOUTH 3 TIMES DAILY  90 tablet  1  . indomethacin (INDOCIN) 50 MG capsule TAKE 1 CAPSULE BY MOUTH TWICE A DAY AS NEEDED FOR PAIN.  WITH A MEAL  180 capsule  0  . Multiple Vitamin (MULTIVITAMIN) tablet Take 1 tablet by mouth daily.        . ranitidine (ZANTAC) 150 MG tablet TAKE ONE (1) TABLET BY MOUTH TWO (2) TIMES DAILY  60 tablet  1   No current facility-administered medications on file prior to visit.      Review of Systems Review of Systems  Constitutional: Negative for fever, appetite change, fatigue and unexpected weight change.  Eyes: Negative for pain and visual disturbance.  Respiratory: Negative for cough and shortness of breath.   Cardiovascular: Negative for cp or palpitations    Gastrointestinal: Negative for nausea, diarrhea and constipation.  Genitourinary: Negative for urgency and frequency.  Skin: Negative for pallor or rash    Neurological: Negative for weakness, light-headedness, numbness and headaches.  Hematological: Negative for adenopathy. Does not bruise/bleed easily.  Psychiatric/Behavioral: Negative for dysphoric mood. The patient is not nervous/anxious.         Objective:   Physical Exam  Constitutional: She appears well-developed and well-nourished. No distress.  HENT:  Head: Normocephalic and atraumatic.  Mouth/Throat: Oropharynx is clear and moist.  Eyes: Conjunctivae and EOM are normal. Pupils are equal, round, and reactive to light. Right eye exhibits no discharge. Left eye exhibits no discharge. No scleral icterus.  Neck: Normal range of motion. Neck supple. No JVD present. Carotid bruit is not present. No thyromegaly present.  Cardiovascular: Regular rhythm, normal heart sounds and intact distal pulses.  Exam reveals no gallop.   Pulmonary/Chest: Effort normal and breath sounds normal. No respiratory distress. She has no wheezes. She has no rales.  Abdominal: Soft. Bowel sounds are normal. She exhibits no distension, no abdominal bruit and no mass. There is no tenderness.  Musculoskeletal: She exhibits no edema and no tenderness.  Lymphadenopathy:    She has no cervical adenopathy.  Neurological: She is alert. She has normal reflexes. No cranial nerve deficit. She exhibits normal muscle tone. Coordination normal.  Skin: Skin is warm and dry. No rash noted. No erythema. No pallor.  Psychiatric: She has a normal mood and affect.          Assessment & Plan:

## 2012-10-10 NOTE — Assessment & Plan Note (Signed)
Lab today and disc at f/u Per pt her diet is better

## 2012-10-10 NOTE — Assessment & Plan Note (Signed)
A1c today Per pt diet is better and she has lost wt

## 2012-10-10 NOTE — Assessment & Plan Note (Signed)
Disc in detail risks of smoking and possible outcomes including copd, vascular/ heart disease, cancer , respiratory and sinus infections  Pt voices understanding Pt is not ready to quit yet Disc opt of CT lungs for lung cancer screen- she may be interested in this and will check with her insurance re; coverage

## 2012-10-10 NOTE — Assessment & Plan Note (Signed)
Lab today for LFT Pt has lost wt intentionally

## 2012-10-20 ENCOUNTER — Encounter: Payer: Self-pay | Admitting: Family Medicine

## 2012-10-20 ENCOUNTER — Ambulatory Visit (INDEPENDENT_AMBULATORY_CARE_PROVIDER_SITE_OTHER): Payer: PRIVATE HEALTH INSURANCE | Admitting: Family Medicine

## 2012-10-20 VITALS — BP 142/86 | HR 99 | Temp 98.2°F | Ht 65.0 in | Wt 134.5 lb

## 2012-10-20 DIAGNOSIS — E785 Hyperlipidemia, unspecified: Secondary | ICD-10-CM

## 2012-10-20 DIAGNOSIS — D7589 Other specified diseases of blood and blood-forming organs: Secondary | ICD-10-CM

## 2012-10-20 DIAGNOSIS — R7309 Other abnormal glucose: Secondary | ICD-10-CM

## 2012-10-20 DIAGNOSIS — F17200 Nicotine dependence, unspecified, uncomplicated: Secondary | ICD-10-CM

## 2012-10-20 DIAGNOSIS — R945 Abnormal results of liver function studies: Secondary | ICD-10-CM

## 2012-10-20 MED ORDER — METFORMIN HCL 500 MG PO TABS: 500.0000 mg | ORAL_TABLET | Freq: Two times a day (BID) | ORAL | Status: DC

## 2012-10-20 NOTE — Patient Instructions (Addendum)
You now have diabetes- mild  Start metformin 500 mg one pill twice daily with breakfast and evening meal  Please go to [facility] when they are open and ask to talk to Myriam Jacobson about their diabetic education program  Please work on stopping alcohol entirely Also think about how and when you want to quit smoking For cholesterol Avoid red meat/ fried foods/ egg yolks/ fatty breakfast meats/ butter, cheese and high fat dairy/ and shellfish   Follow up in October as planned

## 2012-10-20 NOTE — Progress Notes (Signed)
Subjective:    Patient ID: Christine Barton, female    DOB: June 15, 1950, 62 y.o.   MRN: [id number]  HPI Here for f/u of abnormal labs   Wt is stable with bmi of 22  Glucose 116 Now A1c is up to 7.2 (was prev under 6) Pt had lost weight Pt states she has not been conscious of what she does  No diabetes in family  Symptoms- has had excessive thirst and frequent urination , some blurry vision ,  No numbness   Ast/alt are back up at 47/44 She has alcohol "desire" not dependence - right now  She drinks alcohol 4-5 days per week and she averages 2-4  Has not had withdrawal symptoms off alcohol   Hyperlipidemia Lab Results  Component Value Date   CHOL 234* 10/10/2012   CHOL 241* 07/21/2011   CHOL 223* 07/29/2009   Lab Results  Component Value Date   HDL 57.40 10/10/2012   HDL 51.10 07/21/2011   HDL 72.50 07/29/2009   Lab Results  Component Value Date   LDLCALC 130* 06/14/2008   Lab Results  Component Value Date   TRIG 208.0* 10/10/2012   TRIG 242.0* 07/21/2011   TRIG 92.0 07/29/2009   Lab Results  Component Value Date   CHOLHDL 4 10/10/2012   CHOLHDL 5 07/21/2011   CHOLHDL 3 07/29/2009   Lab Results  Component Value Date   LDLDIRECT 145.1 10/10/2012   LDLDIRECT 151.4 07/21/2011   LDLDIRECT 132.4 07/29/2009     Hb is high from smoking and macrocytosis from alcohol as well Lab Results  Component Value Date   WBC 6.8 10/10/2012   HGB 16.2* 10/10/2012   HCT 47.9* 10/10/2012   MCV 101.8* 10/10/2012   PLT 246.0 10/10/2012     Patient Active Problem List   Diagnosis Date Noted  . Diabetes type 2, uncontrolled 10/20/2012  . Rapid heart beat 10/10/2012  . Chest pain 08/20/2011  . Left shoulder pain 08/20/2011  . Chronic heartburn 08/20/2011  . Elevated aspartate aminotransferase level 07/30/2011  . Macrocytosis 07/30/2011  . Routine general medical examination at a health care facility 07/21/2011  . Other screening mammogram 07/21/2011  . MICROSCOPIC HEMATURIA 06/05/2010  . CARBUNCLE AND  FURUNCLE OF BUTTOCK 06/05/2010  . FLANK PAIN, LEFT 06/05/2010  . UNSPECIFIED VITAMIN D DEFICIENCY 08/04/2009  . HYPERKALEMIA 08/04/2009  . POSTMENOPAUSAL STATUS 08/04/2009  . HYPERGLYCEMIA 02/18/2009  . HYPERLIPIDEMIA 06/14/2008  . ADVERSE DRUG REACTION 06/14/2008  . [place] DISEASE, CERVICAL 03/30/2007  . ALCOHOL ABUSE 12/06/2006  . Smoking 12/06/2006  . NEUROPATHY 12/06/2006  . DIVERTICULOSIS, COLON 12/06/2006  . FATTY LIVER DISEASE 12/06/2006  . LIVER FUNCTION TESTS, ABNORMAL 12/06/2006  . BREAST CANCER, HX OF 12/06/2006   Past Medical History  Diagnosis Date  . Personal history of malignant neoplasm of breast   . Other abnormal glucose   . Diverticulosis of colon (without mention of hemorrhage)   . Degeneration of cervical intervertebral disc 2006    MRI  . Cervical spondylosis 2006    MRI  . Alcohol abuse, unspecified   . Other chronic nonalcoholic liver disease   . Hyperpotassemia   . Other and unspecified hyperlipidemia   . Nonspecific abnormal results of liver function study   . Microscopic hematuria   . Mononeuritis of unspecified site   . Tobacco use disorder   . Unspecified vitamin D deficiency    Past Surgical History  Procedure Laterality Date  . Breast lumpectomy    . Breast biopsy  9/03    Right  . Colonoscopy     History  Substance Use Topics  . Smoking status: Current Every Day Smoker -- 1.00 packs/day    Types: Cigarettes  . Smokeless tobacco: Not on file  . Alcohol Use: Yes     Comment: Moderate   Family History  Problem Relation Age of Onset  . Heart failure Father    No Known Allergies Current Outpatient Prescriptions on File Prior to Visit  Medication Sig Dispense Refill  . Calcium Carbonate-Vitamin D (CALCIUM 600+D) 600-400 MG-UNIT per tablet Take 1 tablet by mouth daily.        . cyclobenzaprine (FLEXERIL) 10 MG tablet TAKE ONE (1) TABLET BY MOUTH 3 TIMES DAILY  90 tablet  1  . indomethacin (INDOCIN) 50 MG capsule TAKE 1 CAPSULE BY  MOUTH TWICE A DAY AS NEEDED FOR PAIN. WITH A MEAL  180 capsule  0  . Multiple Vitamin (MULTIVITAMIN) tablet Take 1 tablet by mouth daily.        . ranitidine (ZANTAC) 150 MG tablet TAKE ONE (1) TABLET BY MOUTH TWO (2) TIMES DAILY  60 tablet  1   No current facility-administered medications on file prior to visit.    Review of Systems Review of Systems  Constitutional: Negative for fever, appetite change,  and unexpected weight change.  Eyes: Negative for pain and visual disturbance.  Respiratory: Negative for cough and shortness of breath.   Cardiovascular: Negative for cp or palpitations    Gastrointestinal: Negative for nausea, diarrhea and constipation.  Genitourinary: Negative for urgency and frequency. pos for thirst Skin: Negative for pallor or rash   Neurological: Negative for weakness, light-headedness, numbness and headaches.  Hematological: Negative for adenopathy. Does not bruise/bleed easily.  Psychiatric/Behavioral: Negative for dysphoric mood. The patient is not nervous/anxious.         Objective:   Physical Exam  Constitutional: She appears well-developed and well-nourished. No distress.  HENT:  Head: Normocephalic and atraumatic.  Right Ear: External ear normal.  Left Ear: External ear normal.  Mouth/Throat: Oropharynx is clear and moist.  Eyes: Conjunctivae and EOM are normal. Pupils are equal, round, and reactive to light.  Neck: Normal range of motion. Neck supple. No JVD present. No thyromegaly present.  Cardiovascular: Normal rate and regular rhythm.   Pulmonary/Chest: Effort normal and breath sounds normal. No respiratory distress. She has no wheezes. She has no rales.  Diffusely distant bs   Abdominal: Soft. Bowel sounds are normal. She exhibits no distension and no mass. There is no tenderness.  Musculoskeletal: She exhibits no edema and no tenderness.  Lymphadenopathy:    She has no cervical adenopathy.  Neurological: She is alert. She has normal  reflexes. She displays tremor. No cranial nerve deficit. She exhibits normal muscle tone. Coordination normal.  Very mild hand tremor   Skin: Skin is warm and dry. No rash noted. No erythema. No pallor.  Psychiatric: Her speech is normal and behavior is normal. Her mood appears anxious. Her affect is blunt. Cognition and memory are normal. She does not exhibit a depressed mood.          Assessment & Plan:

## 2012-10-22 NOTE — Assessment & Plan Note (Signed)
Disc in detail risks of smoking and possible outcomes including copd, vascular/ heart disease, cancer , respiratory and sinus infections  Pt voices understanding Stressed esp imp of this given recent dx of DM and hyperlipidemia Unsure if pt is motivated to quit smoking yet

## 2012-10-22 NOTE — Assessment & Plan Note (Signed)
Suspect this is from alcohol Disc with pt

## 2012-10-22 NOTE — Assessment & Plan Note (Signed)
Lab Results  Component Value Date   HGBA1C 7.2* 10/10/2012   overall new I highly recommend DM teaching- she will check in with program at [facility] Eventually will need to consider ace and chol control  Rev DM diet in detail - and need for change, as well as alcohol cessation Begin metformin 500 mg bid  Disc poss side eff Lab and f/u in 3 mo >25 min spent with face to face with patient, >50% counseling and/or coordinating care

## 2012-10-22 NOTE — Assessment & Plan Note (Signed)
Trig may be high from sugar elevation LDL goal is 100 or below-disc with pt Disc goals for lipids and reasons to control them Rev labs with pt Rev low sat fat diet in detail

## 2012-10-22 NOTE — Assessment & Plan Note (Signed)
Suspect this is from excessive alcohol intake Disc in detail  I feel strongly she should quit alcohol entirely- in agreement Suggested 12 step program if helpful Pt may be in denial- ? If motivation for change is there

## 2013-01-10 ENCOUNTER — Encounter: Payer: PRIVATE HEALTH INSURANCE | Admitting: Family Medicine

## 2013-02-02 ENCOUNTER — Encounter: Payer: Self-pay | Admitting: Family Medicine

## 2013-02-02 ENCOUNTER — Other Ambulatory Visit: Payer: Self-pay | Admitting: *Deleted

## 2013-02-02 ENCOUNTER — Ambulatory Visit (INDEPENDENT_AMBULATORY_CARE_PROVIDER_SITE_OTHER): Payer: PRIVATE HEALTH INSURANCE | Admitting: Family Medicine

## 2013-02-02 VITALS — BP 126/80 | HR 121 | Temp 98.2°F | Ht 65.0 in | Wt 131.8 lb

## 2013-02-02 DIAGNOSIS — E119 Type 2 diabetes mellitus without complications: Secondary | ICD-10-CM

## 2013-02-02 LAB — COMPREHENSIVE METABOLIC PANEL
ALT: 48 U/L — ABNORMAL HIGH (ref 0–35)
AST: 52 U/L — ABNORMAL HIGH (ref 0–37)
Albumin: 4.2 g/dL (ref 3.5–5.2)
Alkaline Phosphatase: 83 U/L (ref 39–117)
BUN: 19 mg/dL (ref 6–23)
CO2: 29 mEq/L (ref 19–32)
Calcium: 10.3 mg/dL (ref 8.4–10.5)
Chloride: 96 mEq/L (ref 96–112)
Creatinine, Ser: 0.6 mg/dL (ref 0.4–1.2)
GFR: 105.58 mL/min (ref >60.00)
Glucose, Bld: 128 mg/dL — ABNORMAL HIGH (ref 70–99)
Potassium: 4.3 mEq/L (ref 3.5–5.1)
Sodium: 135 mEq/L (ref 135–145)
Total Bilirubin: 0.4 mg/dL (ref 0.3–1.2)
Total Protein: 7.9 g/dL (ref 6.0–8.3)

## 2013-02-02 LAB — HEMOGLOBIN A1C: Hgb A1c MFr Bld: 6.8 % — ABNORMAL HIGH (ref 4.6–6.5)

## 2013-02-02 MED ORDER — GLIPIZIDE ER 5 MG PO TB24: 5.0000 mg | ORAL_TABLET | Freq: Every day | ORAL | Status: DC

## 2013-02-02 MED ORDER — INDOMETHACIN 50 MG PO CAPS: 50.0000 mg | ORAL_CAPSULE | Freq: Two times a day (BID) | ORAL | Status: DC

## 2013-02-02 NOTE — Patient Instructions (Addendum)
Hold on to the px for glipizide - do not fill it unless I tell you to (based on labs) Keep watching diet Go to [facility] today and talk to them about their diabetic education program (talk to [place])  Make sure to schedule an eye exam

## 2013-02-02 NOTE — Progress Notes (Signed)
Subjective:    Patient ID: Christine Barton, female    DOB: 06-Aug-1950, 62 y.o.   MRN: [id number]  HPI Doing pretty well   Diabetes Home sugar results - has not been able to get machine /strips or DM ed  DM diet -she is really watching what she eats , on the 2 occ she ate sweets- her sugar felt like it went up  Exercise - is walking dog a lot , wants to get on machines at work Symptoms- feels a little bad "when sugar goes up"- and feet feel much more sensitive (already has neuropathy from chemo) A1C last  Lab Results  Component Value Date   HGBA1C 7.2* 10/10/2012    She was given metformin - she had abdominal cramps and diarrhea - she felt so bad she stopped eating -- tried for a week and 1/2 --- then she stopped  Tried it 2 d ago again  Renal protection-none  Last eye exam --has not scheduled yet    Patient Active Problem List   Diagnosis Date Noted  . Rapid heart beat 10/10/2012  . Chest pain 08/20/2011  . Left shoulder pain 08/20/2011  . Chronic heartburn 08/20/2011  . Elevated aspartate aminotransferase level 07/30/2011  . Macrocytosis 07/30/2011  . Routine general medical examination at a health care facility 07/21/2011  . Other screening mammogram 07/21/2011  . MICROSCOPIC HEMATURIA 06/05/2010  . CARBUNCLE AND FURUNCLE OF BUTTOCK 06/05/2010  . FLANK PAIN, LEFT 06/05/2010  . UNSPECIFIED VITAMIN D DEFICIENCY 08/04/2009  . HYPERKALEMIA 08/04/2009  . POSTMENOPAUSAL STATUS 08/04/2009  . DM2 (diabetes mellitus, type 2) 02/18/2009  . HYPERLIPIDEMIA 06/14/2008  . ADVERSE DRUG REACTION 06/14/2008  . [place] DISEASE, CERVICAL 03/30/2007  . ALCOHOL ABUSE 12/06/2006  . Smoking 12/06/2006  . NEUROPATHY 12/06/2006  . DIVERTICULOSIS, COLON 12/06/2006  . FATTY LIVER DISEASE 12/06/2006  . LIVER FUNCTION TESTS, ABNORMAL 12/06/2006  . BREAST CANCER, HX OF 12/06/2006   Past Medical History  Diagnosis Date  . Personal history of malignant neoplasm of breast   . Other abnormal glucose    . Diverticulosis of colon (without mention of hemorrhage)   . Degeneration of cervical intervertebral disc 2006    MRI  . Cervical spondylosis 2006    MRI  . Alcohol abuse, unspecified   . Other chronic nonalcoholic liver disease   . Hyperpotassemia   . Other and unspecified hyperlipidemia   . Nonspecific abnormal results of liver function study   . Microscopic hematuria   . Mononeuritis of unspecified site   . Tobacco use disorder   . Unspecified vitamin D deficiency    Past Surgical History  Procedure Laterality Date  . Breast lumpectomy    . Breast biopsy  9/03    Right  . Colonoscopy     History  Substance Use Topics  . Smoking status: Current Every Day Smoker -- 1.00 packs/day    Types: Cigarettes  . Smokeless tobacco: Not on file  . Alcohol Use: Yes     Comment: Moderate   Family History  Problem Relation Age of Onset  . Heart failure Father    No Known Allergies Current Outpatient Prescriptions on File Prior to Visit  Medication Sig Dispense Refill  . Calcium Carbonate-Vitamin D (CALCIUM 600+D) 600-400 MG-UNIT per tablet Take 1 tablet by mouth daily.        . cyclobenzaprine (FLEXERIL) 10 MG tablet TAKE ONE (1) TABLET BY MOUTH 3 TIMES DAILY  90 tablet  1  . indomethacin (INDOCIN) 50  MG capsule TAKE 1 CAPSULE BY MOUTH TWICE A DAY AS NEEDED FOR PAIN. WITH A MEAL  180 capsule  0  . Multiple Vitamin (MULTIVITAMIN) tablet Take 1 tablet by mouth daily.        . ranitidine (ZANTAC) 150 MG tablet TAKE ONE (1) TABLET BY MOUTH TWO (2) TIMES DAILY  60 tablet  1   No current facility-administered medications on file prior to visit.      Review of Systems Review of Systems  Constitutional: Negative for fever, appetite change, fatigue and unexpected weight change.  Eyes: Negative for pain and visual disturbance.  Respiratory: Negative for cough and shortness of breath.   Cardiovascular: Negative for cp or palpitations    Gastrointestinal: Negative for nausea, diarrhea  and constipation.  Genitourinary: Negative for urgency and frequency.  Skin: Negative for pallor or rash   Neurological: Negative for weakness, light-headedness, and headaches. pos for numbness/ burning in her feet  Hematological: Negative for adenopathy. Does not bruise/bleed easily.  Psychiatric/Behavioral: Negative for dysphoric mood. The patient is somewhat t nervous/anxious.         Objective:   Physical Exam  Constitutional: She appears well-developed and well-nourished. No distress.  HENT:  Head: Normocephalic and atraumatic.  Mouth/Throat: Oropharynx is clear and moist.  Eyes: Conjunctivae and EOM are normal. Pupils are equal, round, and reactive to light. Right eye exhibits no discharge. Left eye exhibits no discharge. No scleral icterus.  Neck: Normal range of motion. Neck supple. No JVD present. No thyromegaly present.  Cardiovascular: Normal rate, regular rhythm, normal heart sounds and intact distal pulses.  Exam reveals no gallop.   Pulmonary/Chest: Effort normal and breath sounds normal. No respiratory distress. She has no wheezes. She has no rales.  Diffusely distant bs   Abdominal: Soft. Bowel sounds are normal. She exhibits no distension and no mass. There is no tenderness.  Musculoskeletal: She exhibits no edema and no tenderness.  Lymphadenopathy:    She has no cervical adenopathy.  Neurological: She is alert. She has normal reflexes. No cranial nerve deficit. She exhibits normal muscle tone. Coordination normal.  Mild hand tremor   Skin: Skin is warm and dry. No rash noted. No erythema. No pallor.  Psychiatric: She has a normal mood and affect.          Assessment & Plan:

## 2013-02-02 NOTE — Telephone Encounter (Signed)
Pt wanted 90 day rx sent in to pharmacy, the copay for 90 day supply is the same price as a 30 day supply.

## 2013-02-04 NOTE — Assessment & Plan Note (Signed)
Lab today If A1c is above 7 will start glipizide  Rev low glycemic diet  Will check out DM teaching at [facility] Disc alcohol cessation Disc yearly eye exam

## 2013-02-12 ENCOUNTER — Telehealth: Payer: Self-pay | Admitting: *Deleted

## 2013-02-12 MED ORDER — INDOMETHACIN 50 MG PO CAPS: 50.0000 mg | ORAL_CAPSULE | Freq: Three times a day (TID) | ORAL | Status: DC

## 2013-02-12 NOTE — Telephone Encounter (Signed)
Pt said her insurance will pay the same amount for 90 tabs of the indomethacin 50 mg, that she pays for the 60 tabs she gets now, pt wants a new Rx sent to [facility], with new directions of taking med TID instead of BID, pt spoke with Aniceto Boss about this last week and Aniceto Boss thought pt wanted a 90 day supply so she sent 180 tabs to [facility] with directions of still taking it BID, but pt only wants a 1 month supply but wants 90 tabs instead, pt didn't pick up Rx with 180 tabs because she wants the directions changed to TID, please advise

## 2013-02-12 NOTE — Telephone Encounter (Signed)
I will change it and send to [facility]

## 2013-02-12 NOTE — Telephone Encounter (Signed)
Pt notified new Rx sent to pharmacy

## 2013-02-26 ENCOUNTER — Other Ambulatory Visit: Payer: Self-pay | Admitting: Family Medicine

## 2013-02-26 NOTE — Telephone Encounter (Signed)
done

## 2013-02-26 NOTE — Telephone Encounter (Signed)
Electronic refill request, not on current med list, please advise

## 2013-02-26 NOTE — Telephone Encounter (Signed)
Please refill times one

## 2013-04-18 ENCOUNTER — Encounter: Payer: PRIVATE HEALTH INSURANCE | Admitting: Family Medicine

## 2013-04-25 ENCOUNTER — Telehealth: Payer: Self-pay

## 2013-04-25 NOTE — Telephone Encounter (Signed)
Christine Barton with [facility] said for 2-3 weeks pt has had dull centered chest pain for 1-2 min intervals on and off with occasional pressure feeling.Few Days ago had pain in neck for brief period, but no recent neck pain. Had chest pain earlier today for 1 min. Now no pain or problem. No SOB, N&V or sweats. Christine Barton wanted to know if OK to schedule appt in office 04/26/13. Dr Glori Bickers said OK to schedule appt in office on 04/26/13 but if pt has any CP or symptoms tonight to go to ED for eval. Christine Barton voiced understanding and will advise pt and give appt to pt.

## 2013-04-25 NOTE — Telephone Encounter (Signed)
Patient Information:  Caller Name: Jared  Phone: [phone]  Patient: Christine Barton, Christine Barton  Gender: Female  DOB: [birth year]  Age: 63 Years  PCP: Loura Pardon [facility])  Office Follow Up:  Does the office need to follow up with this patient?: No  Instructions For The Office: N/A  RN Note:  Spoke with Rena - consult with Dr Glori Bickers - okay to schedule Appt in am.  If any further episodes tonight is to go to ER.  Symptoms  Reason For Call & Symptoms: Chest pain in middle center of chest happening intermittently x2-3 weeks.  Discomfort lasting 1-2 minutes.  One day prior to this did have discomfort in neck once.  Is heavy smoker.  Discomfort can happen multiple times per day or no times per day.  Did have discomfort for about a minute earlier today.  Reviewed Health History In EMR: Yes  Reviewed Medications In EMR: Yes  Reviewed Allergies In EMR: Yes  Reviewed Surgeries / Procedures: Yes  Date of Onset of Symptoms: Unknown  Guideline(s) Used:  Chest Pain  Disposition Per Guideline:   See Today in Office  Reason For Disposition Reached:   Intermittent chest pains persist > 3 days  Advice Given:  N/A  Patient Will Follow Care Advice:  YES  Appointment Scheduled:  04/26/2013 08:45:00 Appointment Scheduled Provider:  Webb Silversmith

## 2013-04-26 ENCOUNTER — Encounter: Payer: Self-pay | Admitting: Internal Medicine

## 2013-04-26 ENCOUNTER — Ambulatory Visit (INDEPENDENT_AMBULATORY_CARE_PROVIDER_SITE_OTHER): Payer: PRIVATE HEALTH INSURANCE | Admitting: Internal Medicine

## 2013-04-26 VITALS — BP 122/76 | HR 106 | Temp 98.2°F | Wt 131.0 lb

## 2013-04-26 DIAGNOSIS — Z569 Unspecified problems related to employment: Secondary | ICD-10-CM

## 2013-04-26 DIAGNOSIS — Z566 Other physical and mental strain related to work: Secondary | ICD-10-CM

## 2013-04-26 DIAGNOSIS — R079 Chest pain, unspecified: Secondary | ICD-10-CM

## 2013-04-26 DIAGNOSIS — R000 Tachycardia, unspecified: Secondary | ICD-10-CM

## 2013-04-26 MED ORDER — BUPROPION HCL ER (XL) 150 MG PO TB24: 150.0000 mg | ORAL_TABLET | Freq: Every day | ORAL | Status: DC

## 2013-04-26 NOTE — Progress Notes (Signed)
Subjective:    Patient ID: Christine Barton, female    DOB: [birth year], 63 y.o.   MRN: [id number]  HPI  Pt presents to the clinic today with c/o intermittent midsternal chest pains. This started about 2-3 weeks ago. The pain last approx 1-2 minutes. Sometimes the pain occurs multiple times per day. The pain does not radiate. She denies shortness of breath or dizziness. The pain does not really occur with exertion. Yesterday it happened at work while she was sitting down typing. She does feel stressed at work but does not feel anxious (or is not aware that she feels anxious). She has also had pain in her neck. This only occurred once though and she does not think if it is related. She does have a history of GERD. She takes zantac on an intermittent basis. She reports that this feels completely different that her reflux.  Review of Systems      Past Medical History  Diagnosis Date  . Personal history of malignant neoplasm of breast   . Other abnormal glucose   . Diverticulosis of colon (without mention of hemorrhage)   . Degeneration of cervical intervertebral disc 2006    MRI  . Cervical spondylosis 2006    MRI  . Alcohol abuse, unspecified   . Other chronic nonalcoholic liver disease   . Hyperpotassemia   . Other and unspecified hyperlipidemia   . Nonspecific abnormal results of liver function study   . Microscopic hematuria   . Mononeuritis of unspecified site   . Tobacco use disorder   . Unspecified vitamin D deficiency     Current Outpatient Prescriptions  Medication Sig Dispense Refill  . Calcium Carbonate-Vitamin D (CALCIUM 600+D) 600-400 MG-UNIT per tablet Take 1 tablet by mouth daily.        . cyclobenzaprine (FLEXERIL) 10 MG tablet TAKE ONE (1) TABLET BY MOUTH 3 TIMES DAILY  90 tablet  1  . glipiZIDE (GLUCOTROL XL) 5 MG 24 hr tablet Take 1 tablet (5 mg total) by mouth daily.  30 tablet  5  . indomethacin (INDOCIN) 50 MG capsule Take 1 capsule (50 mg total) by mouth 3  (three) times daily with meals. As needed for pain  90 capsule  3  . Multiple Vitamin (MULTIVITAMIN) tablet Take 1 tablet by mouth daily.        . mupirocin ointment (BACTROBAN) 2 % APPLY A SMALL AMOUNT TO AFFECTED AREA ONINSIDE OF NOSE 2 TIMES DAILY AS DIRECTED  22 g  0  . ranitidine (ZANTAC) 150 MG tablet TAKE ONE (1) TABLET BY MOUTH TWO (2) TIMES DAILY  60 tablet  1   No current facility-administered medications for this visit.    Allergies  Allergen Reactions  . Metformin And Related     GI    Family History  Problem Relation Age of Onset  . Heart failure Father     History   Social History  . Marital Status: Single    Spouse Name: N/A    Number of Children: 1  . Years of Education: N/A   Occupational History  .  [employer]   Social History Main Topics  . Smoking status: Current Every Day Smoker -- 1.00 packs/day    Types: Cigarettes  . Smokeless tobacco: Not on file  . Alcohol Use: Yes     Comment: Moderate  . Drug Use: No  . Sexual Activity: Not on file   Other Topics Concern  . Not on file  Social History Narrative   Divorced      1 child      Works at [employer]           Constitutional: Denies fever, malaise, fatigue, headache or abrupt weight  Respiratory: Denies difficulty breathing, shortness of breath, cough or sputum production.   Cardiovascular: Denies chest tightness, palpitations or swelling in the hands or feet.  Gastrointestinal: Denies abdominal pain, bloating, constipation, diarrhea or blood in the stool.   Neurological: Denies dizziness, difficulty with memory, difficulty with speech or problems with balance and coordination.   No other specific complaints in a complete review of systems (except as listed in HPI above).  Objective:   Physical Exam   BP 122/76  Pulse 106  Temp(Src) 98.2 F (36.8 C) (Oral)  Wt 131 lb (59.421 kg)  SpO2 97% Wt Readings from Last 3 Encounters:  04/26/13 131 lb (59.421 kg)  02/02/13  131 lb 12 oz (59.761 kg)  10/20/12 134 lb 8 oz (61.009 kg)    General: Appears her stated age, well developed, well nourished in NAD. Cardiovascular: Tachycardic with normal rhythm. S1,S2 noted.  No murmur, rubs or gallops noted. No JVD or BLE edema. No carotid bruits noted. Pulmonary/Chest: Normal effort and positive vesicular breath sounds. No respiratory distress. No wheezes, rales or ronchi noted.  Abdomen: Soft and nontender. Normal bowel sounds, no bruits noted. No distention or masses noted. Liver, spleen and kidneys non palpable. Neurological: Alert and oriented. Cranial nerves II-XII intact. Coordination normal. +DTRs bilaterally.    BMET    Component Value Date/Time   NA 135 02/02/2013 1246   K 4.3 02/02/2013 1246   CL 96 02/02/2013 1246   CO2 29 02/02/2013 1246   GLUCOSE 128* 02/02/2013 1246   BUN 19 02/02/2013 1246   CREATININE 0.6 02/02/2013 1246   CALCIUM 10.3 02/02/2013 1246   GFRNONAA 78.13 07/29/2009 0932    Lipid Panel     Component Value Date/Time   CHOL 234* 10/10/2012 0934   TRIG 208.0* 10/10/2012 0934   HDL 57.40 10/10/2012 0934   CHOLHDL 4 10/10/2012 0934   VLDL 41.6* 10/10/2012 0934   LDLCALC 130* 06/14/2008 2145    CBC    Component Value Date/Time   WBC 6.8 10/10/2012 0934   RBC 4.70 10/10/2012 0934   HGB 16.2* 10/10/2012 0934   HCT 47.9* 10/10/2012 0934   PLT 246.0 10/10/2012 0934   MCV 101.8* 10/10/2012 0934   MCHC 33.9 10/10/2012 0934   RDW 13.8 10/10/2012 0934   LYMPHSABS 2.4 10/10/2012 0934   MONOABS 0.5 10/10/2012 0934   EOSABS 0.1 10/10/2012 0934   BASOSABS 0.0 10/10/2012 0934    Hgb A1C Lab Results  Component Value Date   HGBA1C 6.8* 02/02/2013        Assessment & Plan:   Intermittent chest pains:  Most recent labs reviewed from 01/2013 Will obtain ECG today- stable compared to ECG 08/2011 I would like to repeat her CBC and CMET- she declines today She declines referral today to cardiology for possible stress test ( I wonder if she could have Prinzmetal  Angina) I advised her to restart her zantac, in case there is a component of reflux She would like to try something for stress and possible anxiety- I have called in Wellbutrin  She has an appt with Dr. Glori Bickers 05/11/13- we can reassess the stress/anxiety at that time. If you are still having chest pain on the medication, I do recommend that you be evaluated by  cardiology.  RTC as needed or if symptoms persist. ER if worse

## 2013-04-26 NOTE — Progress Notes (Signed)
Pre-visit discussion using our clinic review tool. No additional management support is needed unless otherwise documented below in the visit note.  

## 2013-04-26 NOTE — Patient Instructions (Signed)
Chest Pain (Nonspecific) °It is often hard to give a specific diagnosis for the cause of chest pain. There is always a chance that your pain could be related to something serious, such as a heart attack or a blood clot in the lungs. You need to follow up with your caregiver for further evaluation. °CAUSES  °· Heartburn. °· Pneumonia or bronchitis. °· Anxiety or stress. °· Inflammation around your heart (pericarditis) or lung (pleuritis or pleurisy). °· A blood clot in the lung. °· A collapsed lung (pneumothorax). It can develop suddenly on its own (spontaneous pneumothorax) or from injury (trauma) to the chest. °· Shingles infection (herpes zoster virus). °The chest wall is composed of bones, muscles, and cartilage. Any of these can be the source of the pain. °· The bones can be bruised by injury. °· The muscles or cartilage can be strained by coughing or overwork. °· The cartilage can be affected by inflammation and become sore (costochondritis). °DIAGNOSIS  °Lab tests or other studies, such as X-rays, electrocardiography, stress testing, or cardiac imaging, may be needed to find the cause of your pain.  °TREATMENT  °· Treatment depends on what may be causing your chest pain. Treatment may include: °· Acid blockers for heartburn. °· Anti-inflammatory medicine. °· Pain medicine for inflammatory conditions. °· Antibiotics if an infection is present. °· You may be advised to change lifestyle habits. This includes stopping smoking and avoiding alcohol, caffeine, and chocolate. °· You may be advised to keep your head raised (elevated) when sleeping. This reduces the chance of acid going backward from your stomach into your esophagus. °· Most of the time, nonspecific chest pain will improve within 2 to 3 days with rest and mild pain medicine. °HOME CARE INSTRUCTIONS  °· If antibiotics were prescribed, take your antibiotics as directed. Finish them even if you start to feel better. °· For the next few days, avoid physical  activities that bring on chest pain. Continue physical activities as directed. °· Do not smoke. °· Avoid drinking alcohol. °· Only take over-the-counter or prescription medicine for pain, discomfort, or fever as directed by your caregiver. °· Follow your caregiver's suggestions for further testing if your chest pain does not go away. °· Keep any follow-up appointments you made. If you do not go to an appointment, you could develop lasting (chronic) problems with pain. If there is any problem keeping an appointment, you must call to reschedule. °SEEK MEDICAL CARE IF:  °· You think you are having problems from the medicine you are taking. Read your medicine instructions carefully. °· Your chest pain does not go away, even after treatment. °· You develop a rash with blisters on your chest. °SEEK IMMEDIATE MEDICAL CARE IF:  °· You have increased chest pain or pain that spreads to your arm, neck, jaw, back, or abdomen. °· You develop shortness of breath, an increasing cough, or you are coughing up blood. °· You have severe back or abdominal pain, feel nauseous, or vomit. °· You develop severe weakness, fainting, or chills. °· You have a fever. °THIS IS AN EMERGENCY. Do not wait to see if the pain will go away. Get medical help at once. Call your local emergency services (911 in [place] Do not drive yourself to the hospital. °MAKE SURE YOU:  °· Understand these instructions. °· Will watch your condition. °· Will get help right away if you are not doing well or get worse. °Document Released: 01/06/2005 Document Revised: 06/21/2011 Document Reviewed: 11/02/2007 °ExitCare® Patient Information ©2014 [employer]   [employer]. ° °

## 2013-04-27 ENCOUNTER — Telehealth: Payer: Self-pay | Admitting: Family Medicine

## 2013-04-27 NOTE — Telephone Encounter (Signed)
Relevant patient education mailed to patient.  

## 2013-05-06 ENCOUNTER — Telehealth: Payer: Self-pay | Admitting: Family Medicine

## 2013-05-06 DIAGNOSIS — K7689 Other specified diseases of liver: Secondary | ICD-10-CM

## 2013-05-06 DIAGNOSIS — E119 Type 2 diabetes mellitus without complications: Secondary | ICD-10-CM

## 2013-05-06 DIAGNOSIS — E559 Vitamin D deficiency, unspecified: Secondary | ICD-10-CM

## 2013-05-06 DIAGNOSIS — E785 Hyperlipidemia, unspecified: Secondary | ICD-10-CM

## 2013-05-06 DIAGNOSIS — Z0000 Encounter for general adult medical examination without abnormal findings: Secondary | ICD-10-CM

## 2013-05-06 NOTE — Telephone Encounter (Signed)
Message copied by [name] on Sun May 06, 2013 10:46 AM ------      Message from: Selinda Orion J      Created: Mon Apr 30, 2013 12:23 PM      Regarding: Lab orders for Monday,1.26.15       Patient is scheduled for CPX labs, please order future labs, Thanks , Terri       ------

## 2013-05-07 ENCOUNTER — Other Ambulatory Visit: Payer: PRIVATE HEALTH INSURANCE

## 2013-05-11 ENCOUNTER — Encounter: Payer: PRIVATE HEALTH INSURANCE | Admitting: Family Medicine

## 2013-07-05 ENCOUNTER — Other Ambulatory Visit: Payer: Self-pay | Admitting: Family Medicine

## 2013-07-05 NOTE — Telephone Encounter (Signed)
Please refill times one

## 2013-07-05 NOTE — Telephone Encounter (Signed)
Ok to refill 

## 2013-07-06 NOTE — Telephone Encounter (Signed)
done

## 2013-08-17 ENCOUNTER — Other Ambulatory Visit: Payer: Self-pay

## 2013-08-17 DIAGNOSIS — Z1231 Encounter for screening mammogram for malignant neoplasm of breast: Secondary | ICD-10-CM

## 2013-08-23 ENCOUNTER — Ambulatory Visit
Admission: RE | Admit: 2013-08-23 | Discharge: 2013-08-23 | Disposition: A | Discharge status: Home / Self Care | Payer: PRIVATE HEALTH INSURANCE | Source: Ambulatory Visit

## 2013-08-23 DIAGNOSIS — Z1231 Encounter for screening mammogram for malignant neoplasm of breast: Secondary | ICD-10-CM

## 2013-08-28 ENCOUNTER — Encounter: Payer: Self-pay | Admitting: *Deleted

## 2013-09-10 ENCOUNTER — Other Ambulatory Visit (INDEPENDENT_AMBULATORY_CARE_PROVIDER_SITE_OTHER): Payer: PRIVATE HEALTH INSURANCE

## 2013-09-10 DIAGNOSIS — E559 Vitamin D deficiency, unspecified: Secondary | ICD-10-CM

## 2013-09-10 DIAGNOSIS — K7689 Other specified diseases of liver: Secondary | ICD-10-CM

## 2013-09-10 DIAGNOSIS — E119 Type 2 diabetes mellitus without complications: Secondary | ICD-10-CM

## 2013-09-10 DIAGNOSIS — E785 Hyperlipidemia, unspecified: Secondary | ICD-10-CM

## 2013-09-10 DIAGNOSIS — Z0000 Encounter for general adult medical examination without abnormal findings: Secondary | ICD-10-CM

## 2013-09-10 LAB — COMPREHENSIVE METABOLIC PANEL
ALT: 22 U/L (ref 0–35)
AST: 27 U/L (ref 0–37)
Albumin: 4.1 g/dL (ref 3.5–5.2)
Alkaline Phosphatase: 64 U/L (ref 39–117)
BUN: 11 mg/dL (ref 6–23)
CO2: 25 mEq/L (ref 19–32)
Calcium: 9.9 mg/dL (ref 8.4–10.5)
Chloride: 103 mEq/L (ref 96–112)
Creatinine, Ser: 0.8 mg/dL (ref 0.4–1.2)
GFR: 78.19 mL/min (ref >60.00)
Glucose, Bld: 116 mg/dL — ABNORMAL HIGH (ref 70–99)
Potassium: 4.1 mEq/L (ref 3.5–5.1)
Sodium: 137 mEq/L (ref 135–145)
Total Bilirubin: 1 mg/dL (ref 0.2–1.2)
Total Protein: 7.2 g/dL (ref 6.0–8.3)

## 2013-09-10 LAB — LIPID PANEL
Cholesterol: 238 mg/dL — ABNORMAL HIGH (ref 0–200)
HDL: 37.9 mg/dL — ABNORMAL LOW (ref >39.00)
LDL Cholesterol: 169 mg/dL — ABNORMAL HIGH (ref 0–99)
Total CHOL/HDL Ratio: 6
Triglycerides: 154 mg/dL — ABNORMAL HIGH (ref 0.0–149.0)
VLDL: 30.8 mg/dL (ref 0.0–40.0)

## 2013-09-10 LAB — CBC WITH DIFFERENTIAL/PLATELET
Basophils Absolute: 0 10*3/uL (ref 0.0–0.1)
Basophils Relative: 0.5 % (ref 0.0–3.0)
Eosinophils Absolute: 0.2 10*3/uL (ref 0.0–0.7)
Eosinophils Relative: 2.7 % (ref 0.0–5.0)
HCT: 45.1 % (ref 36.0–46.0)
Hemoglobin: 15.2 g/dL — ABNORMAL HIGH (ref 12.0–15.0)
Lymphocytes Relative: 27.9 % (ref 12.0–46.0)
Lymphs Abs: 2.4 10*3/uL (ref 0.7–4.0)
MCHC: 33.7 g/dL (ref 30.0–36.0)
MCV: 98.7 fl (ref 78.0–100.0)
Monocytes Absolute: 0.5 10*3/uL (ref 0.1–1.0)
Monocytes Relative: 5.4 % (ref 3.0–12.0)
Neutro Abs: 5.5 10*3/uL (ref 1.4–7.7)
Neutrophils Relative %: 63.5 % (ref 43.0–77.0)
Platelets: 252 10*3/uL (ref 150.0–400.0)
RBC: 4.57 Mil/uL (ref 3.87–5.11)
RDW: 13.4 % (ref 11.5–15.5)
WBC: 8.7 10*3/uL (ref 4.0–10.5)

## 2013-09-10 LAB — TSH: TSH: 2.26 u[IU]/mL (ref 0.35–4.50)

## 2013-09-10 LAB — HEMOGLOBIN A1C: Hgb A1c MFr Bld: 7.3 % — ABNORMAL HIGH (ref 4.6–6.5)

## 2013-09-11 LAB — VITAMIN D 25 HYDROXY (VIT D DEFICIENCY, FRACTURES): Vit D, 25-Hydroxy: 44 ng/mL (ref 30–89)

## 2013-09-12 ENCOUNTER — Ambulatory Visit (INDEPENDENT_AMBULATORY_CARE_PROVIDER_SITE_OTHER): Payer: PRIVATE HEALTH INSURANCE | Admitting: Family Medicine

## 2013-09-12 ENCOUNTER — Encounter: Payer: Self-pay | Admitting: Family Medicine

## 2013-09-12 ENCOUNTER — Other Ambulatory Visit (HOSPITAL_COMMUNITY)
Admission: RE | Admit: 2013-09-12 | Discharge: 2013-09-12 | Disposition: A | Discharge status: Home / Self Care | Payer: PRIVATE HEALTH INSURANCE | Source: Ambulatory Visit | Attending: Family Medicine | Admitting: Family Medicine

## 2013-09-12 ENCOUNTER — Encounter: Payer: Self-pay | Admitting: Internal Medicine

## 2013-09-12 VITALS — BP 114/68 | HR 120 | Temp 98.0°F | Ht 64.75 in | Wt 128.0 lb

## 2013-09-12 DIAGNOSIS — Z01419 Encounter for gynecological examination (general) (routine) without abnormal findings: Secondary | ICD-10-CM

## 2013-09-12 DIAGNOSIS — E785 Hyperlipidemia, unspecified: Secondary | ICD-10-CM

## 2013-09-12 DIAGNOSIS — Z23 Encounter for immunization: Secondary | ICD-10-CM

## 2013-09-12 DIAGNOSIS — F17200 Nicotine dependence, unspecified, uncomplicated: Secondary | ICD-10-CM

## 2013-09-12 DIAGNOSIS — B370 Candidal stomatitis: Secondary | ICD-10-CM

## 2013-09-12 DIAGNOSIS — Z1151 Encounter for screening for human papillomavirus (HPV): Secondary | ICD-10-CM | POA: Insufficient documentation

## 2013-09-12 DIAGNOSIS — F1011 Alcohol abuse, in remission: Secondary | ICD-10-CM

## 2013-09-12 DIAGNOSIS — E559 Vitamin D deficiency, unspecified: Secondary | ICD-10-CM

## 2013-09-12 DIAGNOSIS — Z0000 Encounter for general adult medical examination without abnormal findings: Secondary | ICD-10-CM

## 2013-09-12 DIAGNOSIS — E119 Type 2 diabetes mellitus without complications: Secondary | ICD-10-CM

## 2013-09-12 MED ORDER — NYSTATIN 100000 UNIT/ML MT SUSP: 5.0000 mL | Freq: Three times a day (TID) | OROMUCOSAL | Status: DC

## 2013-09-12 MED ORDER — CYCLOBENZAPRINE HCL 10 MG PO TABS: ORAL_TABLET | ORAL | Status: DC

## 2013-09-12 MED ORDER — INDOMETHACIN 50 MG PO CAPS: 50.0000 mg | ORAL_CAPSULE | Freq: Three times a day (TID) | ORAL | Status: DC

## 2013-09-12 NOTE — Patient Instructions (Signed)
Tdap today (tetanus vaccine) Pap done today Watch diet for sugar and fat (Avoid red meat/ fried foods/ egg yolks/ fatty breakfast meats/ butter, cheese and high fat dairy/ and shellfish  ) to improve cholesterol and diabetes Get to [facility] and start their program for diabetic education  For sore throat - take the nystatin solution as directed (for thrush)- update me in 10-14 days if not better  Schedule fasting labs and follow up in 3 months (cholesterol and sugar)

## 2013-09-12 NOTE — Progress Notes (Signed)
Subjective:    Patient ID: Christine Barton, female    DOB: [birth year], 63 y.o.   MRN: [id number]  HPI Here for health maintenance exam and to review chronic medical problems    Having a problem with her throat/neck  She gets a sore throat on and off - comes and goes  No other symptoms No swallowing difficulty    No allergy symptoms or problems   Wt is down 2 lb with bmi of 21 Down 14 lb since 5/13- she is not sure why  She stopped drinking alcohol completely about 7-8 weeks ago-for a while she was eating more  Is proud of that   Td 2/04- needs it today ptx 3/10 Flu shot 10/14 Zoster vaccine 5/13  Mammogram nl 5/15 Self exam no lumps   Pap nl 4/11  No gyn problems , no bleeding  Will do pap today  Smoking status- increased when she quit drinking  Breathing is ok  She has a smoker's cough  Has never quit before  She is not ready to quit   Hyperlipidemia Lab Results  Component Value Date   CHOL 238* 09/10/2013   CHOL 234* 10/10/2012   CHOL 241* 07/21/2011   Lab Results  Component Value Date   HDL 37.90* 09/10/2013   HDL 57.40 10/10/2012   HDL 51.10 07/21/2011   Lab Results  Component Value Date   LDLCALC 169* 09/10/2013   LDLCALC 130* 06/14/2008   Lab Results  Component Value Date   TRIG 154.0* 09/10/2013   TRIG 208.0* 10/10/2012   TRIG 242.0* 07/21/2011   Lab Results  Component Value Date   CHOLHDL 6 09/10/2013   CHOLHDL 4 10/10/2012   CHOLHDL 5 07/21/2011   Lab Results  Component Value Date   LDLDIRECT 145.1 10/10/2012   LDLDIRECT 151.4 07/21/2011   LDLDIRECT 132.4 07/29/2009   eating a lot of fish  Not a lot of exercise - likes to ride bike- will start back  LDL - is up to 169- she is eating more in general  She is starting to learn how to read labels  Fair amount of greasy foods like bacon    DM Lab Results  Component Value Date   HGBA1C 7.3* 09/10/2013    Up from 6.8  Wants to start the class at [facility]    Patient Active Problem List   Diagnosis Date  Noted  . Encounter for routine gynecological examination 09/12/2013  . Rapid heart beat 10/10/2012  . Chronic heartburn 08/20/2011  . Routine general medical examination at a health care facility 07/21/2011  . UNSPECIFIED VITAMIN D DEFICIENCY 08/04/2009  . POSTMENOPAUSAL STATUS 08/04/2009  . DM2 (diabetes mellitus, type 2) 02/18/2009  . HYPERLIPIDEMIA 06/14/2008  . [place] DISEASE, CERVICAL 03/30/2007  . ALCOHOL ABUSE 12/06/2006  . Smoking 12/06/2006  . NEUROPATHY 12/06/2006  . DIVERTICULOSIS, COLON 12/06/2006  . FATTY LIVER DISEASE 12/06/2006  . BREAST CANCER, HX OF 12/06/2006   Past Medical History  Diagnosis Date  . Personal history of malignant neoplasm of breast   . Other abnormal glucose   . Diverticulosis of colon (without mention of hemorrhage)   . Degeneration of cervical intervertebral disc 2006    MRI  . Cervical spondylosis 2006    MRI  . Alcohol abuse, unspecified   . Other chronic nonalcoholic liver disease   . Hyperpotassemia   . Other and unspecified hyperlipidemia   . Nonspecific abnormal results of liver function study   . Microscopic hematuria   .  Mononeuritis of unspecified site   . Tobacco use disorder   . Unspecified vitamin D deficiency    Past Surgical History  Procedure Laterality Date  . Breast lumpectomy    . Breast biopsy  9/03    Right  . Colonoscopy     History  Substance Use Topics  . Smoking status: Current Every Day Smoker -- 1.00 packs/day    Types: Cigarettes  . Smokeless tobacco: Not on file  . Alcohol Use: Yes     Comment: Moderate   Family History  Problem Relation Age of Onset  . Heart failure Father    Allergies  Allergen Reactions  . Glipizide     Stomach pain  . Metformin And Related     GI   Current Outpatient Prescriptions on File Prior to Visit  Medication Sig Dispense Refill  . Calcium Carbonate-Vitamin D (CALCIUM 600+D) 600-400 MG-UNIT per tablet Take 1 tablet by mouth daily.        . Multiple Vitamin  (MULTIVITAMIN) tablet Take 1 tablet by mouth daily.        . mupirocin ointment (BACTROBAN) 2 % APPLY A SMALL AMOUNT TO AFFECTED AREA ONTHE INSIDE OF NOSE 2 TIMES DAILY AS DIRECTED  22 g  0  . ranitidine (ZANTAC) 150 MG tablet TAKE ONE (1) TABLET BY MOUTH TWO (2) TIMES DAILY  60 tablet  1   No current facility-administered medications on file prior to visit.    Review of Systems Review of Systems  Constitutional: Negative for fever, appetite change, fatigue and unexpected weight change.  Eyes: Negative for pain and visual disturbance.  ENt pos for intermittently ST Respiratory: Negative for wheeze  and shortness of breath.   Cardiovascular: Negative for cp or palpitations    Gastrointestinal: Negative for nausea, diarrhea and constipation.  Genitourinary: Negative for urgency and frequency.  Skin: Negative for pallor or rash   Neurological: Negative for weakness, light-headedness, numbness and headaches.  Hematological: Negative for adenopathy. Does not bruise/bleed easily.  Psychiatric/Behavioral: Negative for dysphoric mood. The patient is not nervous/anxious.         Objective:   Physical Exam  Constitutional: She appears well-developed and well-nourished. No distress.  HENT:  Head: Normocephalic and atraumatic.  Right Ear: External ear normal.  Left Ear: External ear normal.  Nose: Nose normal.  Mouth/Throat: Oropharynx is clear and moist.  Eyes: Conjunctivae and EOM are normal. Pupils are equal, round, and reactive to light. Right eye exhibits no discharge. Left eye exhibits no discharge. No scleral icterus.  Neck: Normal range of motion. Neck supple. No JVD present. No thyromegaly present.  Cardiovascular: Normal rate, regular rhythm, normal heart sounds and intact distal pulses.  Exam reveals no gallop.   Pulmonary/Chest: Effort normal and breath sounds normal. No respiratory distress. She has no wheezes. She has no rales.  Abdominal: Soft. Bowel sounds are normal. She  exhibits no distension and no mass. There is no tenderness.  Genitourinary: Vagina normal and uterus normal. No breast swelling, tenderness, discharge or bleeding. There is no rash, tenderness or lesion on the right labia. There is no rash, tenderness or lesion on the left labia. Uterus is not enlarged and not tender. Cervix exhibits no motion tenderness, no discharge and no friability. Right adnexum displays no mass, no tenderness and no fullness. Left adnexum displays no mass, no tenderness and no fullness. No erythema or bleeding around the vagina. No vaginal discharge found.  Breast exam: No mass, nodules, thickening, tenderness, bulging, retraction, inflamation,  nipple discharge or skin changes noted.  No axillary or clavicular LA.      Musculoskeletal: She exhibits no edema and no tenderness.  Lymphadenopathy:    She has no cervical adenopathy.  Neurological: She is alert. She has normal reflexes. No cranial nerve deficit. She exhibits normal muscle tone. Coordination normal.  Skin: Skin is warm and dry. No rash noted. No erythema. No pallor.  Psychiatric: She has a normal mood and affect.          Assessment & Plan:   Problem List Items Addressed This Visit     Digestive   Thrush     Suspect this may be the reason for ST tx with nystatin solun swish and swallow and update if not resolved in 1-2 weeks If not , would consid ENT eval due to smoking hx     Relevant Medications      nystatin (MYCOSTATIN) 100000 UNIT/ML suspension     Endocrine   DM2 (diabetes mellitus, type 2)      Lab Results  Component Value Date   HGBA1C 7.3* 09/10/2013   This is up  Pt has been eating more Rev DM diet She will f/u for DM teaching at [facility] and f/u 3 mo  Add med if not imp      Other   UNSPECIFIED VITAMIN D DEFICIENCY     Vitamin D level is therapeutic with current supplementation Disc importance of this to bone and overall health      HYPERLIPIDEMIA     Lipids are up  significantly Disc goals for lipids and reasons to control them Rev labs with pt Rev low sat fat diet in detail Given info on diet  Will work on it and f/u 3 mo Consider statin if needed     History of alcohol abuse   Smoking     Disc in detail risks of smoking and possible outcomes including copd, vascular/ heart disease, cancer , respiratory and sinus infections  Pt voices understanding Pt states she is not ready to quit   Pt may be a candidate for low dose CT screening protocol for lung cancer and is interested if affordable- will look into that     Routine general medical examination at a health care facility - Primary     Reviewed health habits including diet and exercise and skin cancer prevention Reviewed appropriate screening tests for age  Also reviewed health mt list, fam hx and immunization status , as well as social and family history    See HPI Lab rev today    Encounter for routine gynecological examination     Exam done with pap  No problems     Relevant Orders      Cytology - PAP    Other Visit Diagnoses   Need for Tdap vaccination        Relevant Orders       Tdap vaccine greater than or equal to 7yo IM (Completed)

## 2013-09-12 NOTE — Progress Notes (Signed)
Pre visit review using our clinic review tool, if applicable. No additional management support is needed unless otherwise documented below in the visit note. 

## 2013-09-13 DIAGNOSIS — B370 Candidal stomatitis: Secondary | ICD-10-CM | POA: Insufficient documentation

## 2013-09-13 LAB — CYTOLOGY - PAP

## 2013-09-13 NOTE — Assessment & Plan Note (Signed)
Lab Results  Component Value Date   HGBA1C 7.3* 09/10/2013   This is up  Pt has been eating more Rev DM diet She will f/u for DM teaching at [employer] [facility] [employer] and f/u 3 mo  Add med if not imp

## 2013-09-13 NOTE — Assessment & Plan Note (Signed)
Suspect this may be the reason for ST tx with nystatin solun swish and swallow and update if not resolved in 1-2 weeks If not , would consid ENT eval due to smoking hx

## 2013-09-13 NOTE — Assessment & Plan Note (Signed)
Vitamin D level is therapeutic with current supplementation Disc importance of this to bone and overall health

## 2013-09-13 NOTE — Assessment & Plan Note (Signed)
Reviewed health habits including diet and exercise and skin cancer prevention Reviewed appropriate screening tests for age  Also reviewed health mt list, fam hx and immunization status , as well as social and family history    See HPI Lab rev today

## 2013-09-13 NOTE — Assessment & Plan Note (Signed)
Lipids are up significantly Disc goals for lipids and reasons to control them Rev labs with pt Rev low sat fat diet in detail Given info on diet  Will work on it and f/u 3 mo Consider statin if needed

## 2013-09-13 NOTE — Assessment & Plan Note (Signed)
Exam done with pap  No problems

## 2013-09-13 NOTE — Assessment & Plan Note (Addendum)
Disc in detail risks of smoking and possible outcomes including copd, vascular/ heart disease, cancer , respiratory and sinus infections  Pt voices understanding Pt states she is not ready to quit   Pt may be a candidate for low dose CT screening protocol for lung cancer and is interested if affordable- will look into that

## 2013-09-18 ENCOUNTER — Encounter: Payer: Self-pay | Admitting: Family Medicine

## 2013-11-05 ENCOUNTER — Telehealth: Payer: Self-pay | Admitting: Family Medicine

## 2013-11-05 NOTE — Telephone Encounter (Signed)
Have I seen them ? Not in my box today

## 2013-11-05 NOTE — Telephone Encounter (Signed)
megan @ [facility]  faxed referral for [facility]  She was checking status of Forms and labs And insurance info

## 2013-12-06 ENCOUNTER — Telehealth: Payer: Self-pay | Admitting: Family Medicine

## 2013-12-06 DIAGNOSIS — E119 Type 2 diabetes mellitus without complications: Secondary | ICD-10-CM

## 2013-12-06 DIAGNOSIS — E785 Hyperlipidemia, unspecified: Secondary | ICD-10-CM

## 2013-12-06 NOTE — Telephone Encounter (Signed)
Message copied by [name] on Thu Dec 06, 2013  5:05 PM ------      Message from: Ellamae Sia      Created: Tue Nov 27, 2013  4:24 PM      Regarding: Lab orders for Friday, 8.28.15       Lab orders for a f/u appt ------

## 2013-12-07 ENCOUNTER — Other Ambulatory Visit: Payer: PRIVATE HEALTH INSURANCE

## 2013-12-14 ENCOUNTER — Ambulatory Visit: Payer: PRIVATE HEALTH INSURANCE | Admitting: Family Medicine

## 2014-02-25 ENCOUNTER — Telehealth: Payer: Self-pay

## 2014-02-25 ENCOUNTER — Encounter: Payer: Self-pay | Admitting: Family Medicine

## 2014-02-25 NOTE — Telephone Encounter (Signed)
Px written and in IN box  Please re schedule her appt when able

## 2014-02-25 NOTE — Telephone Encounter (Signed)
Megan from [facility] left v/m pt requested one touch ultra meter and test strips; pt checks BS 4 times a day.Please advise. Pt cancelled 12/14/13 appt.

## 2014-02-26 NOTE — Telephone Encounter (Signed)
Rx faxed and advised pharmacy he needs to r/s appt

## 2014-03-08 ENCOUNTER — Other Ambulatory Visit: Payer: Self-pay | Admitting: Family Medicine

## 2014-03-11 NOTE — Telephone Encounter (Signed)
Please refill times one

## 2014-03-11 NOTE — Telephone Encounter (Signed)
Received refill request electronically from pharmacy. Last refill 07/06/13, last office visit 09/12/13. Is it okay to refill medication?

## 2014-03-12 NOTE — Telephone Encounter (Signed)
done

## 2014-06-10 ENCOUNTER — Other Ambulatory Visit: Payer: Self-pay | Admitting: Family Medicine

## 2014-06-10 NOTE — Telephone Encounter (Signed)
Ok to refill? Last prescribe and seen on 09/12/13. CPE is schedule on 11/01/14.

## 2014-06-10 NOTE — Telephone Encounter (Signed)
Please refill times 1 Thanks

## 2014-06-10 NOTE — Telephone Encounter (Signed)
rx sent to [facility]

## 2014-10-22 ENCOUNTER — Telehealth: Payer: PRIVATE HEALTH INSURANCE | Admitting: Family Medicine

## 2014-10-22 DIAGNOSIS — E1165 Type 2 diabetes mellitus with hyperglycemia: Secondary | ICD-10-CM | POA: Diagnosis not present

## 2014-10-22 DIAGNOSIS — Z0000 Encounter for general adult medical examination without abnormal findings: Secondary | ICD-10-CM

## 2014-10-22 DIAGNOSIS — IMO0002 Reserved for concepts with insufficient information to code with codable children: Secondary | ICD-10-CM

## 2014-10-22 DIAGNOSIS — E559 Vitamin D deficiency, unspecified: Secondary | ICD-10-CM

## 2014-10-22 NOTE — Telephone Encounter (Signed)
-----   Message from Ellamae Sia sent at 10/16/2014  3:26 PM EDT ----- Regarding: Lab orders for Wednesday,7.13.16 Patient is scheduled for CPX labs, please order future labs, Thanks , Terri  Has a couple of pending tests.

## 2014-10-23 ENCOUNTER — Other Ambulatory Visit (INDEPENDENT_AMBULATORY_CARE_PROVIDER_SITE_OTHER): Payer: PRIVATE HEALTH INSURANCE

## 2014-10-23 DIAGNOSIS — E1165 Type 2 diabetes mellitus with hyperglycemia: Secondary | ICD-10-CM

## 2014-10-23 DIAGNOSIS — E559 Vitamin D deficiency, unspecified: Secondary | ICD-10-CM | POA: Diagnosis not present

## 2014-10-23 DIAGNOSIS — IMO0002 Reserved for concepts with insufficient information to code with codable children: Secondary | ICD-10-CM

## 2014-10-23 DIAGNOSIS — Z0000 Encounter for general adult medical examination without abnormal findings: Secondary | ICD-10-CM | POA: Diagnosis not present

## 2014-10-23 LAB — COMPREHENSIVE METABOLIC PANEL
ALT: 50 U/L — ABNORMAL HIGH (ref 0–35)
AST: 69 U/L — ABNORMAL HIGH (ref 0–37)
Albumin: 4.1 g/dL (ref 3.5–5.2)
Alkaline Phosphatase: 80 U/L (ref 39–117)
BUN: 12 mg/dL (ref 6–23)
CO2: 30 mEq/L (ref 19–32)
Calcium: 9.9 mg/dL (ref 8.4–10.5)
Chloride: 101 mEq/L (ref 96–112)
Creatinine, Ser: 0.67 mg/dL (ref 0.40–1.20)
GFR: 94.22 mL/min (ref >60.00)
Glucose, Bld: 126 mg/dL — ABNORMAL HIGH (ref 70–99)
Potassium: 4.5 mEq/L (ref 3.5–5.1)
Sodium: 139 mEq/L (ref 135–145)
Total Bilirubin: 0.7 mg/dL (ref 0.2–1.2)
Total Protein: 7.3 g/dL (ref 6.0–8.3)

## 2014-10-23 LAB — CBC WITH DIFFERENTIAL/PLATELET
Basophils Absolute: 0 10*3/uL (ref 0.0–0.1)
Basophils Relative: 0.4 % (ref 0.0–3.0)
Eosinophils Absolute: 0.1 10*3/uL (ref 0.0–0.7)
Eosinophils Relative: 1.8 % (ref 0.0–5.0)
HCT: 42.8 % (ref 36.0–46.0)
Hemoglobin: 14.6 g/dL (ref 12.0–15.0)
Lymphocytes Relative: 29 % (ref 12.0–46.0)
Lymphs Abs: 2 10*3/uL (ref 0.7–4.0)
MCHC: 34 g/dL (ref 30.0–36.0)
MCV: 104.3 fl — ABNORMAL HIGH (ref 78.0–100.0)
Monocytes Absolute: 0.7 10*3/uL (ref 0.1–1.0)
Monocytes Relative: 9.5 % (ref 3.0–12.0)
Neutro Abs: 4.1 10*3/uL (ref 1.4–7.7)
Neutrophils Relative %: 59.3 % (ref 43.0–77.0)
Platelets: 212 10*3/uL (ref 150.0–400.0)
RBC: 4.1 Mil/uL (ref 3.87–5.11)
RDW: 14.2 % (ref 11.5–15.5)
WBC: 6.9 10*3/uL (ref 4.0–10.5)

## 2014-10-23 LAB — LIPID PANEL
Cholesterol: 156 mg/dL (ref 0–200)
HDL: 52.2 mg/dL (ref >39.00)
LDL Cholesterol: 75 mg/dL (ref 0–99)
NonHDL: 103.8
Total CHOL/HDL Ratio: 3
Triglycerides: 144 mg/dL (ref 0.0–149.0)
VLDL: 28.8 mg/dL (ref 0.0–40.0)

## 2014-10-23 LAB — HEMOGLOBIN A1C: Hgb A1c MFr Bld: 6.3 % (ref 4.6–6.5)

## 2014-10-23 LAB — VITAMIN D 25 HYDROXY (VIT D DEFICIENCY, FRACTURES): VITD: 40.44 ng/mL (ref 30.00–100.00)

## 2014-10-23 LAB — TSH: TSH: 1.51 u[IU]/mL (ref 0.35–4.50)

## 2014-10-23 NOTE — Addendum Note (Signed)
Addended by: Ellamae Sia on: 10/23/2014 04:49 PM   Modules accepted: Orders

## 2014-10-24 LAB — MICROALBUMIN / CREATININE URINE RATIO
Creatinine,U: 115.1 mg/dL
Microalb Creat Ratio: 1 mg/g (ref 0.0–30.0)
Microalb, Ur: 1.2 mg/dL (ref 0.0–1.9)

## 2014-10-25 ENCOUNTER — Other Ambulatory Visit: Payer: Self-pay

## 2014-10-25 DIAGNOSIS — Z1231 Encounter for screening mammogram for malignant neoplasm of breast: Secondary | ICD-10-CM

## 2014-10-29 ENCOUNTER — Ambulatory Visit
Admission: RE | Admit: 2014-10-29 | Discharge: 2014-10-29 | Disposition: A | Discharge status: Home / Self Care | Payer: PRIVATE HEALTH INSURANCE | Source: Ambulatory Visit

## 2014-10-29 DIAGNOSIS — Z1231 Encounter for screening mammogram for malignant neoplasm of breast: Secondary | ICD-10-CM

## 2014-10-29 LAB — HM MAMMOGRAPHY: HM Mammogram: NORMAL

## 2014-10-31 ENCOUNTER — Encounter: Payer: Self-pay | Admitting: *Deleted

## 2014-11-01 ENCOUNTER — Ambulatory Visit (INDEPENDENT_AMBULATORY_CARE_PROVIDER_SITE_OTHER): Payer: PRIVATE HEALTH INSURANCE | Admitting: Family Medicine

## 2014-11-01 ENCOUNTER — Encounter: Payer: Self-pay | Admitting: Internal Medicine

## 2014-11-01 ENCOUNTER — Encounter: Payer: Self-pay | Admitting: Family Medicine

## 2014-11-01 VITALS — BP 132/72 | HR 90 | Temp 97.8°F | Ht 65.0 in | Wt 125.4 lb

## 2014-11-01 DIAGNOSIS — IMO0002 Reserved for concepts with insufficient information to code with codable children: Secondary | ICD-10-CM

## 2014-11-01 DIAGNOSIS — E785 Hyperlipidemia, unspecified: Secondary | ICD-10-CM | POA: Diagnosis not present

## 2014-11-01 DIAGNOSIS — E559 Vitamin D deficiency, unspecified: Secondary | ICD-10-CM

## 2014-11-01 DIAGNOSIS — Z1211 Encounter for screening for malignant neoplasm of colon: Secondary | ICD-10-CM

## 2014-11-01 DIAGNOSIS — Z720 Tobacco use: Secondary | ICD-10-CM | POA: Diagnosis not present

## 2014-11-01 DIAGNOSIS — R748 Abnormal levels of other serum enzymes: Secondary | ICD-10-CM | POA: Insufficient documentation

## 2014-11-01 DIAGNOSIS — Z0000 Encounter for general adult medical examination without abnormal findings: Secondary | ICD-10-CM

## 2014-11-01 DIAGNOSIS — E1165 Type 2 diabetes mellitus with hyperglycemia: Secondary | ICD-10-CM

## 2014-11-01 DIAGNOSIS — F17200 Nicotine dependence, unspecified, uncomplicated: Secondary | ICD-10-CM

## 2014-11-01 DIAGNOSIS — F1010 Alcohol abuse, uncomplicated: Secondary | ICD-10-CM

## 2014-11-01 DIAGNOSIS — F1011 Alcohol abuse, in remission: Secondary | ICD-10-CM

## 2014-11-01 NOTE — Progress Notes (Signed)
Subjective:    Patient ID: Christine Barton, female    DOB: [birth year], 64 y.o.   MRN: [id number]  HPI Here for health maintenance exam and to review chronic medical problems    Had 3 teeth pulled several months ago  They were going to make a plate - just got it (getting used to)  Not able to eat well  Wt is down 3 lb bmi of 20  Other than that ok   HIV screen- not interested  Hep C screen neg 4/13  PNA vaccine 3/10  colonosc 8/05- wants to get that scheduled   Flu shot - got it in the fall   Mammogram 7/16 Self exam- no lumps or changes  Oncology signed off   Pap 6/15 nl  No gyn symptoms at all  Not sexually active   Td 6/15 Zoster vaccine 5/13  dexa 9/05 normal  No broken bones or falls in the past year  D level is 40 - taking her ca and D   Liver tests are up  Lab Results  Component Value Date   ALT 50* 10/23/2014   AST 69* 10/23/2014   ALKPHOS 80 10/23/2014   BILITOT 0.7 10/23/2014   started drinking etoh again  She is drinking 2-3 servings per day -knows she needs to quit  In the past she just quits  Has been to [facility]- did not work out well / she does not like group work or to talk   Not ready to get a counselor yet  Not ready to quit - but giving it thought     Smoking status - same - thinking about quitting    DM Lab Results  Component Value Date   HGBA1C 6.3 10/23/2014   down from 7.3 Eating less due to her dental issues  She drinks ensure however  She took class at [facility]-knows what to do   Lab Results  Component Value Date   TSH 1.51 10/23/2014    Lab Results  Component Value Date   WBC 6.9 10/23/2014   HGB 14.6 10/23/2014   HCT 42.8 10/23/2014   MCV 104.3* 10/23/2014   PLT 212.0 10/23/2014     Patient Active Problem List   Diagnosis Date Noted  . Thrush 09/13/2013  . Encounter for routine gynecological examination 09/12/2013  . Rapid heart beat 10/10/2012  . Heartburn 08/20/2011  . Routine general medical examination at a  health care facility 07/21/2011  . Vitamin D deficiency 08/04/2009  . POSTMENOPAUSAL STATUS 08/04/2009  . Diabetes type 2, uncontrolled 02/18/2009  . Hyperlipidemia 06/14/2008  . [place] DISEASE, CERVICAL 03/30/2007  . History of alcohol abuse 12/06/2006  . Smoking 12/06/2006  . NEUROPATHY 12/06/2006  . DIVERTICULOSIS, COLON 12/06/2006  . FATTY LIVER DISEASE 12/06/2006  . BREAST CANCER, HX OF 12/06/2006   Past Medical History  Diagnosis Date  . Personal history of malignant neoplasm of breast   . Other abnormal glucose   . Diverticulosis of colon (without mention of hemorrhage)   . Degeneration of cervical intervertebral disc 2006    MRI  . Cervical spondylosis 2006    MRI  . Alcohol abuse, unspecified   . Other chronic nonalcoholic liver disease   . Hyperpotassemia   . Other and unspecified hyperlipidemia   . Nonspecific abnormal results of liver function study   . Microscopic hematuria   . Mononeuritis of unspecified site   . Tobacco use disorder   . Unspecified vitamin D deficiency    Past  Surgical History  Procedure Laterality Date  . Breast lumpectomy    . Breast biopsy  9/03    Right  . Colonoscopy     History  Substance Use Topics  . Smoking status: Current Every Day Smoker -- 1.00 packs/day    Types: Cigarettes  . Smokeless tobacco: Not on file  . Alcohol Use: Yes     Comment: Moderate   Family History  Problem Relation Age of Onset  . Heart failure Father    Allergies  Allergen Reactions  . Glipizide     Stomach pain  . Metformin And Related     GI   Current Outpatient Prescriptions on File Prior to Visit  Medication Sig Dispense Refill  . Calcium Carbonate-Vitamin D (CALCIUM 600+D) 600-400 MG-UNIT per tablet Take 1 tablet by mouth daily.      . cyclobenzaprine (FLEXERIL) 10 MG tablet TAKE ONE TABLET BY MOUTH THREE TIMES DAILY AS NEEDED FOR MUSCLE SPASMS 90 tablet 1  . indomethacin (INDOCIN) 50 MG capsule Take 1 capsule (50 mg total) by mouth 3  (three) times daily with meals. As needed for pain 90 capsule 3  . Multiple Vitamin (MULTIVITAMIN) tablet Take 1 tablet by mouth daily.      . mupirocin ointment (BACTROBAN) 2 % APPLY A SMALL AMOUNT TO AFFECTED AREA ONINSIDE OF NOSE 2 TIMES DAILY AS DIRECTED 22 g 0  . nystatin (MYCOSTATIN) 100000 UNIT/ML suspension Take 5 mLs (500,000 Units total) by mouth 3 (three) times daily. Swish in mouth for 30 seconds and then swallow 60 mL 0  . ranitidine (ZANTAC) 150 MG tablet TAKE ONE (1) TABLET BY MOUTH TWO (2) TIMES DAILY 60 tablet 1   No current facility-administered medications on file prior to visit.    Review of Systems Review of Systems  Constitutional: Negative for fever, appetite change, fatigue and unexpected weight change.  Eyes: Negative for pain and visual disturbance.  ENT pos for recent tooth extraction causing dec po intake  Respiratory: Negative for cough and shortness of breath.   Cardiovascular: Negative for cp or palpitations    Gastrointestinal: Negative for nausea, diarrhea and constipation.  Genitourinary: Negative for urgency and frequency.  Skin: Negative for pallor or rash   Neurological: Negative for weakness, light-headedness, numbness and headaches.  Hematological: Negative for adenopathy. Does not bruise/bleed easily.  Psychiatric/Behavioral: Negative for dysphoric mood. The patient is sometimes anxious .         Objective:   Physical Exam  Constitutional: She appears well-developed and well-nourished. No distress.  HENT:  Head: Normocephalic and atraumatic.  Right Ear: External ear normal.  Left Ear: External ear normal.  Mouth/Throat: Oropharynx is clear and moist.  Eyes: Conjunctivae and EOM are normal. Pupils are equal, round, and reactive to light. No scleral icterus.  Neck: Normal range of motion. Neck supple. No JVD present. Carotid bruit is not present. No thyromegaly present.  Cardiovascular: Normal rate, regular rhythm, normal heart sounds and intact  distal pulses.  Exam reveals no gallop.   Pulmonary/Chest: Effort normal and breath sounds normal. No respiratory distress. She has no wheezes. She exhibits no tenderness.  Abdominal: Soft. Bowel sounds are normal. She exhibits no distension, no abdominal bruit and no mass. There is no tenderness.  Genitourinary: No breast swelling, tenderness, discharge or bleeding.  Breast exam: No mass, nodules, thickening, tenderness, bulging, retraction, inflamation, nipple discharge or skin changes noted.  No axillary or clavicular LA.      Baseline surgical change noted  Musculoskeletal: Normal range of motion. She exhibits no edema or tenderness.  No kyphosis   Lymphadenopathy:    She has no cervical adenopathy.  Neurological: She is alert. She has normal reflexes. She displays no tremor. No cranial nerve deficit. She exhibits normal muscle tone. Coordination normal.  Skin: Skin is warm and dry. No rash noted. No erythema. No pallor.  No jaundice   Psychiatric: She has a normal mood and affect.          Assessment & Plan:   Problem List Items Addressed This Visit    Colon cancer screening    Due for screening colonoscopy Will refer for that       Relevant Orders   Ambulatory referral to Gastroenterology   Diabetes type 2, uncontrolled    Lab Results  Component Value Date   HGBA1C 6.3 10/23/2014   This is improved  However - unable to eat as much in the midst of dental work- this may change  F/u 3 mo       Elevated liver enzymes    Suspect due to etoh Enc to quit  Disc poss outcomes if not incl cirrhosis  Voiced understanding  Will continue to follow       History of alcohol abuse    Current  AST/ALT are back up  Stressed importance of quitting - voiced understanding  On MVI daily  Declines AA meetings/dislikes them  Denies any past seizures or detox symptoms in the past when she quits       Hyperlipidemia    Disc goals for lipids and reasons to control them Rev  labs with pt Rev low sat fat diet in detail       Routine general medical examination at a health care facility    Reviewed health habits including diet and exercise and skin cancer prevention Reviewed appropriate screening tests for age  Also reviewed health mt list, fam hx and immunization status , as well as social and family history   Labs reviewed  Ref to GI for colonosc  Counseled on alcohol and smoking cessation       Smoking    Disc in detail risks of smoking and possible outcomes including copd, vascular/ heart disease, cancer , respiratory and sinus infections  Pt voices understanding Unsure if ready to quit yet  Plans to work on stopping alcohol first       Vitamin D deficiency - Primary    Vitamin D level is therapeutic with current supplementation Disc importance of this to bone and overall health

## 2014-11-01 NOTE — Patient Instructions (Addendum)
Work on alcohol cessation - liver tests are elevated Stop at check out for referral to GI for colonoscopy   Also think about smoking cessation   Follow up in 3 months

## 2014-11-01 NOTE — Progress Notes (Signed)
Pre visit review using our clinic review tool, if applicable. No additional management support is needed unless otherwise documented below in the visit note. 

## 2014-11-02 ENCOUNTER — Inpatient Hospital Stay (HOSPITAL_COMMUNITY)
Admission: EM | Admit: 2014-11-02 | Discharge: 2014-11-05 | DRG: 201 | Disposition: A | Discharge status: Home / Self Care | Payer: No Typology Code available for payment source | Attending: Surgery | Admitting: Surgery

## 2014-11-02 ENCOUNTER — Emergency Department (HOSPITAL_COMMUNITY): Payer: No Typology Code available for payment source

## 2014-11-02 ENCOUNTER — Encounter (HOSPITAL_COMMUNITY): Payer: Self-pay | Admitting: Emergency Medicine

## 2014-11-02 DIAGNOSIS — Z853 Personal history of malignant neoplasm of breast: Secondary | ICD-10-CM | POA: Diagnosis not present

## 2014-11-02 DIAGNOSIS — Z79899 Other long term (current) drug therapy: Secondary | ICD-10-CM

## 2014-11-02 DIAGNOSIS — M5030 Other cervical disc degeneration, unspecified cervical region: Secondary | ICD-10-CM | POA: Diagnosis present

## 2014-11-02 DIAGNOSIS — Z923 Personal history of irradiation: Secondary | ICD-10-CM | POA: Diagnosis not present

## 2014-11-02 DIAGNOSIS — J9383 Other pneumothorax: Secondary | ICD-10-CM | POA: Diagnosis present

## 2014-11-02 DIAGNOSIS — F17210 Nicotine dependence, cigarettes, uncomplicated: Secondary | ICD-10-CM | POA: Diagnosis present

## 2014-11-02 DIAGNOSIS — R000 Tachycardia, unspecified: Secondary | ICD-10-CM | POA: Diagnosis not present

## 2014-11-02 DIAGNOSIS — E785 Hyperlipidemia, unspecified: Secondary | ICD-10-CM | POA: Diagnosis present

## 2014-11-02 DIAGNOSIS — K769 Liver disease, unspecified: Secondary | ICD-10-CM | POA: Diagnosis present

## 2014-11-02 DIAGNOSIS — J982 Interstitial emphysema: Secondary | ICD-10-CM | POA: Diagnosis present

## 2014-11-02 DIAGNOSIS — R05 Cough: Secondary | ICD-10-CM | POA: Diagnosis present

## 2014-11-02 DIAGNOSIS — J939 Pneumothorax, unspecified: Secondary | ICD-10-CM | POA: Diagnosis present

## 2014-11-02 DIAGNOSIS — R062 Wheezing: Secondary | ICD-10-CM | POA: Diagnosis present

## 2014-11-02 DIAGNOSIS — Z888 Allergy status to other drugs, medicaments and biological substances status: Secondary | ICD-10-CM | POA: Diagnosis not present

## 2014-11-02 DIAGNOSIS — Z9221 Personal history of antineoplastic chemotherapy: Secondary | ICD-10-CM | POA: Diagnosis not present

## 2014-11-02 DIAGNOSIS — K5730 Diverticulosis of large intestine without perforation or abscess without bleeding: Secondary | ICD-10-CM | POA: Diagnosis present

## 2014-11-02 DIAGNOSIS — E559 Vitamin D deficiency, unspecified: Secondary | ICD-10-CM | POA: Diagnosis present

## 2014-11-02 DIAGNOSIS — M47812 Spondylosis without myelopathy or radiculopathy, cervical region: Secondary | ICD-10-CM | POA: Diagnosis present

## 2014-11-02 DIAGNOSIS — F1010 Alcohol abuse, uncomplicated: Secondary | ICD-10-CM | POA: Diagnosis present

## 2014-11-02 DIAGNOSIS — J9311 Primary spontaneous pneumothorax: Secondary | ICD-10-CM | POA: Diagnosis not present

## 2014-11-02 HISTORY — PX: CHEST TUBE INSERTION: SHX231

## 2014-11-02 HISTORY — DX: Other pneumothorax: J93.83

## 2014-11-02 LAB — CBC
HCT: 42.9 % (ref 36.0–46.0)
Hemoglobin: 15 g/dL (ref 12.0–15.0)
MCH: 35.6 pg — ABNORMAL HIGH (ref 26.0–34.0)
MCHC: 35 g/dL (ref 30.0–36.0)
MCV: 101.9 fL — ABNORMAL HIGH (ref 78.0–100.0)
Platelets: 202 10*3/uL (ref 150–400)
RBC: 4.21 MIL/uL (ref 3.87–5.11)
RDW: 12.9 % (ref 11.5–15.5)
WBC: 9.3 10*3/uL (ref 4.0–10.5)

## 2014-11-02 LAB — I-STAT TROPONIN, ED: Troponin i, poc: 0 ng/mL (ref 0.00–0.08)

## 2014-11-02 LAB — BASIC METABOLIC PANEL
Anion gap: 9 (ref 5–15)
BUN: 10 mg/dL (ref 6–20)
CO2: 23 mmol/L (ref 22–32)
Calcium: 9.1 mg/dL (ref 8.9–10.3)
Chloride: 108 mmol/L (ref 101–111)
Creatinine, Ser: 0.55 mg/dL (ref 0.44–1.00)
GFR calc Af Amer: >60 mL/min (ref >60)
GFR calc non Af Amer: >60 mL/min (ref >60)
Glucose, Bld: 122 mg/dL — ABNORMAL HIGH (ref 65–99)
Potassium: 4 mmol/L (ref 3.5–5.1)
Sodium: 140 mmol/L (ref 135–145)

## 2014-11-02 MED ORDER — SENNOSIDES-DOCUSATE SODIUM 8.6-50 MG PO TABS
1.0000 | ORAL_TABLET | Freq: Every evening | ORAL | Status: DC | PRN
  Filled 2014-11-02: qty 1

## 2014-11-02 MED ORDER — DOCUSATE SODIUM 100 MG PO CAPS
100.0000 mg | ORAL_CAPSULE | Freq: Two times a day (BID) | ORAL | Status: DC
  Administered 2014-11-02 – 2014-11-05 (×6): 100 mg via ORAL
  Filled 2014-11-02 (×8): qty 1

## 2014-11-02 MED ORDER — LIDOCAINE HCL (PF) 1 % IJ SOLN
INTRAMUSCULAR | Status: AC
  Administered 2014-11-02: 16:00:00
  Filled 2014-11-02: qty 5

## 2014-11-02 MED ORDER — MIDAZOLAM HCL 2 MG/2ML IJ SOLN
2.0000 mg | Freq: Once | INTRAMUSCULAR | Status: DC
  Administered 2014-11-02: 2 mg via INTRAVENOUS

## 2014-11-02 MED ORDER — SODIUM CHLORIDE 0.9 % IJ SOLN
3.0000 mL | Freq: Two times a day (BID) | INTRAMUSCULAR | Status: DC
  Administered 2014-11-02 – 2014-11-04 (×4): 3 mL via INTRAVENOUS
  Administered 2014-11-04: 10 mL via INTRAVENOUS
  Administered 2014-11-05: 3 mL via INTRAVENOUS

## 2014-11-02 MED ORDER — LIDOCAINE HCL (PF) 1 % IJ SOLN
INTRAMUSCULAR | Status: AC
  Administered 2014-11-02: 20 mL
  Filled 2014-11-02: qty 5

## 2014-11-02 MED ORDER — VITAMIN B-1 100 MG PO TABS
100.0000 mg | ORAL_TABLET | Freq: Every day | ORAL | Status: DC
  Administered 2014-11-02 – 2014-11-05 (×4): 100 mg via ORAL
  Filled 2014-11-02 (×4): qty 1

## 2014-11-02 MED ORDER — ENOXAPARIN SODIUM 40 MG/0.4ML ~~LOC~~ SOLN
40.0000 mg | SUBCUTANEOUS | Status: DC
  Administered 2014-11-02 – 2014-11-04 (×3): 40 mg via SUBCUTANEOUS
  Filled 2014-11-02 (×4): qty 0.4

## 2014-11-02 MED ORDER — LIDOCAINE HCL (PF) 1 % IJ SOLN
20.0000 mL | Freq: Once | INTRAMUSCULAR | Status: DC
  Administered 2014-11-02: 20 mL

## 2014-11-02 MED ORDER — ACETAMINOPHEN 650 MG RE SUPP: 650.0000 mg | Freq: Four times a day (QID) | RECTAL | Status: DC | PRN

## 2014-11-02 MED ORDER — OXYCODONE HCL 5 MG PO TABS
5.0000 mg | ORAL_TABLET | ORAL | Status: DC | PRN
  Administered 2014-11-02: 5 mg via ORAL
  Filled 2014-11-02: qty 1

## 2014-11-02 MED ORDER — ONDANSETRON HCL 4 MG PO TABS: 4.0000 mg | ORAL_TABLET | Freq: Four times a day (QID) | ORAL | Status: DC | PRN

## 2014-11-02 MED ORDER — ONDANSETRON HCL 4 MG/2ML IJ SOLN
4.0000 mg | Freq: Four times a day (QID) | INTRAMUSCULAR | Status: DC | PRN
  Administered 2014-11-02: 4 mg via INTRAVENOUS
  Filled 2014-11-02: qty 2

## 2014-11-02 MED ORDER — FOLIC ACID 1 MG PO TABS
1.0000 mg | ORAL_TABLET | Freq: Every day | ORAL | Status: DC
  Administered 2014-11-02 – 2014-11-05 (×4): 1 mg via ORAL
  Filled 2014-11-02 (×4): qty 1

## 2014-11-02 MED ORDER — MORPHINE SULFATE 2 MG/ML IJ SOLN
2.0000 mg | INTRAMUSCULAR | Status: DC | PRN
  Administered 2014-11-02 – 2014-11-05 (×7): 2 mg via INTRAVENOUS
  Filled 2014-11-02 (×8): qty 1

## 2014-11-02 MED ORDER — SODIUM CHLORIDE 0.9 % IJ SOLN: 3.0000 mL | INTRAMUSCULAR | Status: DC | PRN

## 2014-11-02 MED ORDER — ACETAMINOPHEN 325 MG PO TABS: 650.0000 mg | ORAL_TABLET | Freq: Four times a day (QID) | ORAL | Status: DC | PRN

## 2014-11-02 MED ORDER — KETOROLAC TROMETHAMINE 15 MG/ML IJ SOLN
15.0000 mg | Freq: Four times a day (QID) | INTRAMUSCULAR | Status: DC | PRN
  Administered 2014-11-02 – 2014-11-05 (×8): 15 mg via INTRAVENOUS
  Filled 2014-11-02 (×8): qty 1

## 2014-11-02 MED ORDER — ALUM & MAG HYDROXIDE-SIMETH 200-200-20 MG/5ML PO SUSP: 30.0000 mL | Freq: Four times a day (QID) | ORAL | Status: DC | PRN

## 2014-11-02 MED ORDER — SODIUM CHLORIDE 0.9 % IV SOLN: 250.0000 mL | INTRAVENOUS | Status: DC | PRN

## 2014-11-02 MED ORDER — MIDAZOLAM HCL 2 MG/2ML IJ SOLN
INTRAMUSCULAR | Status: AC
  Administered 2014-11-02: 2 mg via INTRAVENOUS
  Filled 2014-11-02: qty 2

## 2014-11-02 NOTE — ED Notes (Signed)
Portable dg at bedside.

## 2014-11-02 NOTE — ED Notes (Signed)
Per EMS: pt from work for eval of acute onset of right sided cp, sob and tachycardia. EMS noted lung sounds clear and intialy hypertensive and tachycardia-140. Pt given 1000 cc NS bolus en route and HR decreased to 110. Pt given 4L nasal canula for comfort. nad noted. No additional meds given en route.

## 2014-11-02 NOTE — CV Procedure (Signed)
Chest Tube Insertion Procedure Note  Indications:  Clinically significant rightPneumothorax  Pre-operative Diagnosis: Spontaneous right Pneumothorax  Post-operative Diagnosis: Same  Procedure Details  Informed consent was obtained for the procedure, including sedation.  Risks of lung perforation, hemorrhage, arrhythmia, and adverse drug reaction were discussed.   After sterile skin prep, using standard technique, a 20 French tube was placed in the right anterior 6th rib space.  Findings: None  Estimated Blood Loss:  Minimal         Specimens:  None              Complications:  None; patient tolerated the procedure well.         Disposition: admitted to 2W         Condition: stable  Attending Attestation: I performed the procedure.

## 2014-11-02 NOTE — ED Provider Notes (Signed)
CSN: [id number]     Arrival date & time 11/02/14  1511 History   First MD Initiated Contact with Patient 11/02/14 1513     Chief Complaint  Patient presents with  . Shortness of Breath  . Tachycardia     (Consider location/radiation/quality/duration/timing/severity/associated sxs/prior Treatment) HPI Comments: Patients with history of smoking, breast cancer in the 1990s -- presents with complaints of acute onset of shortness of breath and right-sided chest pain while at work today. She was walking a lot but not doing any significantly strenuous activities. She's never had symptoms like this before. She denies fever, cough. EMS was called and administered oxygen. Patient was found to be hypoxic to 90% on room air, tachycardic in the 120s and hypertensive. Patient denies risk factors for pulmonary embolism including: unilateral leg swelling, history of DVT/PE/other blood clots, use of estrogens, recent immobilizations, recent surgery, recent travel (>4hr segment), malignancy, hemoptysis. Per EMS, patient becomes very short of breath with any activity. The onset of this condition was acute. The course is constant. Alleviating factors: sitting still.      Patient is a 64 y.o. female presenting with shortness of breath. The history is provided by the patient.  Shortness of Breath Associated symptoms: chest pain   Associated symptoms: no abdominal pain, no cough, no fever, no headaches, no rash, no sore throat, no vomiting and no wheezing     Past Medical History  Diagnosis Date  . Personal history of malignant neoplasm of breast   . Other abnormal glucose   . Diverticulosis of colon (without mention of hemorrhage)   . Degeneration of cervical intervertebral disc 2006    MRI  . Cervical spondylosis 2006    MRI  . Alcohol abuse, unspecified   . Other chronic nonalcoholic liver disease   . Hyperpotassemia   . Other and unspecified hyperlipidemia   . Nonspecific abnormal results of liver  function study   . Microscopic hematuria   . Mononeuritis of unspecified site   . Tobacco use disorder   . Unspecified vitamin D deficiency    Past Surgical History  Procedure Laterality Date  . Breast lumpectomy    . Breast biopsy  9/03    Right  . Colonoscopy     Family History  Problem Relation Age of Onset  . Heart failure Father    History  Substance Use Topics  . Smoking status: Current Every Day Smoker -- 1.00 packs/day    Types: Cigarettes  . Smokeless tobacco: Not on file  . Alcohol Use: Yes     Comment: Moderate   OB History    No data available     Review of Systems  Constitutional: Negative for fever.  HENT: Negative for rhinorrhea and sore throat.   Eyes: Negative for redness.  Respiratory: Positive for shortness of breath. Negative for cough and wheezing.   Cardiovascular: Positive for chest pain. Negative for leg swelling.  Gastrointestinal: Negative for nausea, vomiting, abdominal pain and diarrhea.  Genitourinary: Negative for dysuria.  Musculoskeletal: Negative for myalgias.  Skin: Negative for rash.  Neurological: Negative for headaches.    Allergies  Glipizide and Metformin and related  Home Medications   Prior to Admission medications   Medication Sig Start Date End Date Taking? Authorizing Provider  Calcium Carbonate-Vitamin D (CALCIUM 600+D) 600-400 MG-UNIT per tablet Take 1 tablet by mouth daily.      Historical Provider, MD  cyclobenzaprine (FLEXERIL) 10 MG tablet TAKE ONE TABLET BY MOUTH THREE TIMES DAILY AS NEEDED  FOR MUSCLE SPASMS 06/10/14   [name], MD  indomethacin (INDOCIN) 50 MG capsule Take 1 capsule (50 mg total) by mouth 3 (three) times daily with meals. As needed for pain 09/12/13   [name], MD  Multiple Vitamin (MULTIVITAMIN) tablet Take 1 tablet by mouth daily.      Historical Provider, MD  mupirocin ointment (BACTROBAN) 2 % APPLY A SMALL AMOUNT TO AFFECTED AREA ONINSIDE OF NOSE 2 TIMES DAILY AS DIRECTED 03/12/14    [name], MD  nystatin (MYCOSTATIN) 100000 UNIT/ML suspension Take 5 mLs (500,000 Units total) by mouth 3 (three) times daily. Swish in mouth for 30 seconds and then swallow 09/12/13   [name], MD  ranitidine (ZANTAC) 150 MG tablet TAKE ONE (1) TABLET BY MOUTH TWO (2) TIMES DAILY 08/22/12   [name] Tower, MD   BP 155/88 mmHg  Pulse 123  Temp(Src) 98.3 F (36.8 C) (Oral)  Resp 27  SpO2 97%   Physical Exam  Constitutional: She appears well-developed and well-nourished.  HENT:  Head: Normocephalic and atraumatic.  Eyes: Conjunctivae are normal. Right eye exhibits no discharge. Left eye exhibits no discharge.  Neck: Normal range of motion. Neck supple.  Cardiovascular: Regular rhythm and normal heart sounds.  Tachycardia present.   Pulmonary/Chest: Effort normal. Tachypnea (mild) noted. No respiratory distress. She has decreased breath sounds in the right upper field, the right middle field and the right lower field. She has no wheezes. She has no rhonchi. She has no rales.  Abdominal: Soft. There is no tenderness. There is no rebound and no guarding.  Musculoskeletal: She exhibits no edema or tenderness.  Neurological: She is alert.  Skin: Skin is warm and dry.  Psychiatric: She has a normal mood and affect.  Nursing note and vitals reviewed.   ED Course  Procedures (including critical care time) Labs Review Labs Reviewed  CBC - Abnormal; Notable for the following:    MCV 101.9 (*)    MCH 35.6 (*)    All other components within normal limits  BASIC METABOLIC PANEL  I-STAT TROPOININ, ED    Imaging Review Dg Chest Portable 1 View  11/02/2014   CLINICAL DATA:  Chest pain and shortness of Breath  EXAM: PORTABLE CHEST - 1 VIEW  COMPARISON:  June 27, 2009  FINDINGS: There is a large right-sided pneumothorax with slight tension component. No edema or consolidation. Heart size and pulmonary vascularity are normal. No adenopathy. There are surgical clips the right axillary  region. No bone lesions.  IMPRESSION: Large right-sided pneumothorax with mild tension component.  Critical Value/emergent results were called by telephone at the time of interpretation on 11/02/2014 at 3:35 pm to Dr. Gareth Morgan , who verbally acknowledged these results.   Electronically Signed   By: Lowella Grip III M.D.   On: 11/02/2014 15:35    ED ECG REPORT   Date: 11/02/2014  Rate: 117  Rhythm: sinus tachycardia  QRS Axis: normal  Intervals: normal  ST/T Wave abnormalities: normal  Conduction Disutrbances:none  Narrative Interpretation:   Old EKG Reviewed: changes noted, new tachycardia  I have personally reviewed the EKG tracing and agree with the computerized printout as noted.   3:21 PM Patient seen and examined. Concern for R pneumothorax on exam. Pending portable chest x-ray. HR 120. O2 is 96% on 2L [place]. Work-up initiated.    Vital signs reviewed and are as follows: BP 155/88 mmHg  Pulse 123  Temp(Src) 98.3 F (36.8 C) (Oral)  Resp 27  SpO2 97%  3:31 PM Large R-sided pneumothorax noted on x-ray.   3:47 PM Patient seen with Dr. Billy Fischer and I have spoken with Dr. Cyndia Bent who will see to place a chest tube. Patient is stable. Daughter at bedside.   3:58 PM Dr. Cyndia Bent at bedside. Asked for 36m IV Versed to help patient relax during procedure. Lido ordered as well.   MDM   Final diagnoses:  Spontaneous pneumothorax   Admit.    JCarlisle Cater PA-C 11/02/14 1[place] MD 11/02/14 2200

## 2014-11-02 NOTE — H&P (Signed)
[address]       [place] [address]             [phone]      Cardiothoracic Surgery History and Physical  Christine Barton is an 64 y.o. female.   Chief Complaint: Spontaneous right pneumothorax HPI:   The patient is a 64 year old woman who smokes one and a half packs of cigarettes per day for many years and developed SOB and right chest pain at work today. She was brought in by EMS with sats of 90% on RA with sinus tach 120's. CXR showed a large right ptx with slight tension.  Past Medical History  Diagnosis Date  . Personal history of malignant neoplasm of breast   . Other abnormal glucose   . Diverticulosis of colon (without mention of hemorrhage)   . Degeneration of cervical intervertebral disc 2006    MRI  . Cervical spondylosis 2006    MRI  . Alcohol abuse, unspecified   . Other chronic nonalcoholic liver disease   . Hyperpotassemia   . Other and unspecified hyperlipidemia   . Nonspecific abnormal results of liver function study   . Microscopic hematuria   . Mononeuritis of unspecified site   . Tobacco use disorder   . Unspecified vitamin D deficiency     Past Surgical History  Procedure Laterality Date  . Breast lumpectomy    . Breast biopsy  9/03    Right  . Colonoscopy      Family History  Problem Relation Age of Onset  . Heart failure Father    Social History:  reports that she has been smoking Cigarettes.  She has been smoking about 1.00 pack per day. She does not have any smokeless tobacco history on file. She reports that she drinks alcohol. She reports that she does not use illicit drugs.  Allergies:  Allergies  Allergen Reactions  . Glipizide Other (See Comments)    Stomach pain  . Metformin And Related Other (See Comments)    Stomach pain    Medications Prior to Admission  Medication Sig Dispense Refill  . cyclobenzaprine (FLEXERIL) 10 MG tablet TAKE ONE TABLET BY MOUTH THREE TIMES DAILY AS NEEDED FOR MUSCLE  SPASMS 90 tablet 1  . indomethacin (INDOCIN) 50 MG capsule Take 1 capsule (50 mg total) by mouth 3 (three) times daily with meals. As needed for pain (Patient taking differently: Take 50 mg by mouth daily with breakfast. ) 90 capsule 3  . Multiple Vitamin (MULTIVITAMIN WITH MINERALS) TABS tablet Take 1 tablet by mouth daily.    . mupirocin ointment (BACTROBAN) 2 % APPLY A SMALL AMOUNT TO AFFECTED AREA ONINSIDE OF NOSE 2 TIMES DAILY AS DIRECTED (Patient taking differently: APPLY A SMALL AMOUNT TO AFFECTED AREA ON  NOSE OR RECTUM 2 TIMES DAILY AS NEEDED FOR WOUND CARE) 22 g 0  . nystatin (MYCOSTATIN) 100000 UNIT/ML suspension Take 5 mLs (500,000 Units total) by mouth 3 (three) times daily. Swish in mouth for 30 seconds and then swallow (Patient not taking: Reported on 11/02/2014) 60 mL 0  . ranitidine (ZANTAC) 150 MG tablet TAKE ONE (1) TABLET BY MOUTH TWO (2) TIMES DAILY (Patient not taking: Reported on 11/02/2014) 60 tablet 1    Results for orders placed or performed during the hospital encounter of 11/02/14 (from the past 48 hour(s))  Basic metabolic panel     Status: Abnormal   Collection Time: 11/02/14  3:21 PM  Result Value Ref Range   Sodium 140 135 - 145 mmol/L   Potassium 4.0 3.5 - 5.1 mmol/L   Chloride 108 101 - 111 mmol/L   CO2 23 22 - 32 mmol/L   Glucose, Bld 122 (H) 65 - 99 mg/dL   BUN 10 6 - 20 mg/dL   Creatinine, Ser 0.55 0.44 - 1.00 mg/dL   Calcium 9.1 8.9 - 10.3 mg/dL   GFR calc non Af Amer >60 >60 mL/min   GFR calc Af Amer >60 >60 mL/min    Comment: (NOTE) The eGFR has been calculated using the CKD EPI equation. This calculation has not been validated in all clinical situations. eGFR's persistently <60 mL/min signify possible Chronic Kidney Disease.    Anion gap 9 5 - 15  CBC     Status: Abnormal   Collection Time: 11/02/14  3:21 PM  Result Value Ref Range   WBC 9.3 4.0 - 10.5 K/uL   RBC 4.21 3.87 - 5.11 MIL/uL   Hemoglobin 15.0 12.0 - 15.0 g/dL   HCT 42.9 36.0 - 46.0  %   MCV 101.9 (H) 78.0 - 100.0 fL   MCH 35.6 (H) 26.0 - 34.0 pg   MCHC 35.0 30.0 - 36.0 g/dL   RDW 12.9 11.5 - 15.5 %   Platelets 202 150 - 400 K/uL  I-stat troponin, ED     Status: None   Collection Time: 11/02/14  3:31 PM  Result Value Ref Range   Troponin i, poc 0.00 0.00 - 0.08 ng/mL   Comment 3            Comment: Due to the release kinetics of cTnI, a negative result within the first hours of the onset of symptoms does not rule out myocardial infarction with certainty. If myocardial infarction is still suspected, repeat the test at appropriate intervals.    Dg Chest Portable 1 View  11/02/2014   CLINICAL DATA:  Chest tube placement for pneumothorax  EXAM: PORTABLE CHEST - 1 VIEW  COMPARISON:  Study obtained earlier in the day  FINDINGS: Right chest tube present with resolution of tension pneumothorax. Lungs are clear. Heart size and pulmonary vascularity are normal. No adenopathy. There are surgical clips in the right upper quadrant region.  IMPRESSION: No pneumothorax following right chest tube placement. Lungs clear. No change in cardiac silhouette.   Electronically Signed   By: Lowella Grip III M.D.   On: 11/02/2014 16:43   Dg Chest Portable 1 View  11/02/2014   CLINICAL DATA:  Chest pain and shortness of Breath  EXAM: PORTABLE CHEST - 1 VIEW  COMPARISON:  June 27, 2009  FINDINGS: There is a large right-sided pneumothorax with slight tension component. No edema or consolidation. Heart size and pulmonary vascularity are normal. No adenopathy. There are surgical clips the right axillary region. No bone lesions.  IMPRESSION: Large right-sided pneumothorax with mild tension component.  Critical Value/emergent results were called by telephone at the time of interpretation on 11/02/2014 at 3:35 pm to Dr. Gareth Morgan , who verbally acknowledged these results.   Electronically Signed   By: Lowella Grip III M.D.   On: 11/02/2014 15:35    Review of Systems  Constitutional:  Negative for fever, chills, weight loss and malaise/fatigue.  HENT: Negative.   Eyes: Negative.   Respiratory: Negative for cough and hemoptysis.        Shortness of breath and right chest pain today only  Cardiovascular: Negative for orthopnea, leg swelling and PND.  Gastrointestinal: Negative.   Genitourinary: Negative.   Musculoskeletal: Positive for back pain.       Takes Indocin daily as needed for back pain  Skin: Negative.   Neurological: Negative.   Endo/Heme/Allergies: Negative.   Psychiatric/Behavioral: Negative.     Blood pressure 167/87, pulse 106, temperature 97.8 F (36.6 C), temperature source Oral, resp. rate 26, height _0  (1.651 m), weight 56.8 kg (125 lb 3.5 oz), SpO2 98 %. Physical Exam  Constitutional: She is oriented to person, place, and time. She appears well-developed and well-nourished.  Looks uncomfortable  HENT:  Head: Normocephalic and atraumatic.  Mouth/Throat: Oropharynx is clear and moist.  Eyes: EOM are normal. Pupils are equal, round, and reactive to light.  Neck: Normal range of motion. Neck supple. No JVD present. No tracheal deviation present. No thyromegaly present.  Cardiovascular: Normal rate, regular rhythm, normal heart sounds and intact distal pulses.   No murmur heard. Respiratory: She has wheezes. She exhibits no tenderness.  Increased work of breathing and decreased breath sounds on the right  GI: Soft. Bowel sounds are normal. She exhibits no distension and no mass. There is no tenderness.  Musculoskeletal: Normal range of motion. She exhibits no edema.  Lymphadenopathy:    She has no cervical adenopathy.  Neurological: She is alert and oriented to person, place, and time. She has normal strength. No cranial nerve deficit or sensory deficit.  Skin: Skin is warm and dry.  Psychiatric: She has a normal mood and affect.     Assessment/Plan  Spontaneous right pneumothorax in a long time heavy smoker. Chest tube inserted with  complete reexpansion of the lung. Plan admission for chest tube management. Strongly encouraged smoking cessation.   Gaye Pollack 11/02/2014, 5:39 PM

## 2014-11-03 ENCOUNTER — Encounter (HOSPITAL_COMMUNITY): Payer: Self-pay | Admitting: Surgical

## 2014-11-03 ENCOUNTER — Inpatient Hospital Stay (HOSPITAL_COMMUNITY): Payer: No Typology Code available for payment source

## 2014-11-03 MED ORDER — LEVALBUTEROL HCL 0.63 MG/3ML IN NEBU
0.6300 mg | INHALATION_SOLUTION | Freq: Three times a day (TID) | RESPIRATORY_TRACT | Status: DC
  Administered 2014-11-03 – 2014-11-05 (×6): 0.63 mg via RESPIRATORY_TRACT
  Filled 2014-11-03 (×14): qty 3

## 2014-11-03 NOTE — Assessment & Plan Note (Signed)
Disc goals for lipids and reasons to control them Rev labs with pt Rev low sat fat diet in detail   

## 2014-11-03 NOTE — Progress Notes (Addendum)
[address]       [place], [address]             [phone]          Subjective: Says breathing is more comfortable with tube in place  Objective: Vital signs in last 24 hours: Temp:  [97.4 F (36.3 C)-98.4 F (36.9 C)] 98.4 F (36.9 C) (07/24 0507) Pulse Rate:  [88-123] 88 (07/24 0507) Cardiac Rhythm:  [-] Sinus tachycardia (07/23 2100) Resp:  [20-27] 20 (07/24 0507) BP: (144-167)/(77-101) 144/84 mmHg (07/24 0507) SpO2:  [90 %-99 %] 99 % (07/24 0507) Weight:  [125 lb 3.5 oz (56.8 kg)] 125 lb 3.5 oz (56.8 kg) (07/23 1731)  Hemodynamic parameters for last 24 hours:    Intake/Output from previous day: 07/23 0701 - 07/24 0700 In: 60 [P.O.:60] Out: 0  Intake/Output this shift:    General appearance: alert, cooperative and no distress Heart: regular rate and rhythm Lungs: + scattered exp wheezes Abdomen: soft, nontender Extremities: no edema  Lab Results:  Recent Labs  11/02/14 1521  WBC 9.3  HGB 15.0  HCT 42.9  PLT 202   BMET:  Recent Labs  11/02/14 1521  NA 140  K 4.0  CL 108  CO2 23  GLUCOSE 122*  BUN 10  CREATININE 0.55  CALCIUM 9.1    PT/INR: No results for input(s): LABPROT, INR in the last 72 hours. ABG No results found for: PHART, HCO3, TCO2, ACIDBASEDEF, O2SAT CBG (last 3)  No results for input(s): GLUCAP in the last 72 hours.  Meds Scheduled Meds: . docusate sodium  100 mg Oral BID  . enoxaparin (LOVENOX) injection  40 mg Subcutaneous Q24H  . folic acid  1 mg Oral Daily  . sodium chloride  3 mL Intravenous Q12H  . thiamine  100 mg Oral Daily   Continuous Infusions:  PRN Meds:.sodium chloride, acetaminophen **OR** acetaminophen, alum & mag hydroxide-simeth, ketorolac, morphine injection, ondansetron **OR** ondansetron (ZOFRAN) IV, oxyCODONE, senna-docusate, sodium chloride  Xrays Dg Chest Portable 1 View  11/02/2014   CLINICAL DATA:  Chest tube placement for pneumothorax  EXAM: PORTABLE CHEST - 1 VIEW   COMPARISON:  Study obtained earlier in the day  FINDINGS: Right chest tube present with resolution of tension pneumothorax. Lungs are clear. Heart size and pulmonary vascularity are normal. No adenopathy. There are surgical clips in the right upper quadrant region.  IMPRESSION: No pneumothorax following right chest tube placement. Lungs clear. No change in cardiac silhouette.   Electronically Signed   By: Lowella Grip III M.D.   On: 11/02/2014 16:43   Dg Chest Portable 1 View  11/02/2014   CLINICAL DATA:  Chest pain and shortness of Breath  EXAM: PORTABLE CHEST - 1 VIEW  COMPARISON:  June 27, 2009  FINDINGS: There is a large right-sided pneumothorax with slight tension component. No edema or consolidation. Heart size and pulmonary vascularity are normal. No adenopathy. There are surgical clips the right axillary region. No bone lesions.  IMPRESSION: Large right-sided pneumothorax with mild tension component.  Critical Value/emergent results were called by telephone at the time of interpretation on 11/02/2014 at 3:35 pm to Dr. Gareth Morgan , who verbally acknowledged these results.   Electronically Signed   By: Lowella Grip III M.D.   On: 11/02/2014 15:35  Portable Chest 1 View  11/03/2014   CLINICAL DATA:  Pneumothorax  EXAM: PORTABLE CHEST - 1 VIEW  COMPARISON:  11/02/2014  FINDINGS: Right-sided chest tube  remains in place with tip at the right lung apex. There is trace pneumothorax identified only at the tip of the chest tube at the right lung apex. No mediastinal shift. Right axillary clips are noted. Lungs are clear. Heart size normal.  IMPRESSION: Trace recurrent right apical pneumothorax with chest tube in place.   Electronically Signed   By: Conchita Paris M.D.   On: 11/03/2014 09:18   Dg Chest Portable 1 View  11/02/2014   CLINICAL DATA:  Chest tube placement for pneumothorax  EXAM: PORTABLE CHEST - 1 VIEW  COMPARISON:  Study obtained earlier in the day  FINDINGS: Right chest tube  present with resolution of tension pneumothorax. Lungs are clear. Heart size and pulmonary vascularity are normal. No adenopathy. There are surgical clips in the right upper quadrant region.  IMPRESSION: No pneumothorax following right chest tube placement. Lungs clear. No change in cardiac silhouette.   Electronically Signed   By: Lowella Grip III M.D.   On: 11/02/2014 16:43   Dg Chest Portable 1 View  11/02/2014   CLINICAL DATA:  Chest pain and shortness of Breath  EXAM: PORTABLE CHEST - 1 VIEW  COMPARISON:  June 27, 2009  FINDINGS: There is a large right-sided pneumothorax with slight tension component. No edema or consolidation. Heart size and pulmonary vascularity are normal. No adenopathy. There are surgical clips the right axillary region. No bone lesions.  IMPRESSION: Large right-sided pneumothorax with mild tension component.  Critical Value/emergent results were called by telephone at the time of interpretation on 11/02/2014 at 3:35 pm to Dr. Gareth Morgan , who verbally acknowledged these results.   Electronically Signed   By: Lowella Grip III M.D.   On: 11/02/2014 15:35   Chest tube- large air leak with cough  Assessment/Plan:  1 small pntx- will place tube to suction 2 add xopenex for wheeze   GOLD,WAYNE E 11/03/2014   Chart reviewed, patient examined, agree with above. She has tiny apical ptx on CXR and I do not see an air leak at this time. Will put to suction since the tube was not on suction initially.

## 2014-11-03 NOTE — Progress Notes (Signed)
Utilization review complete. Myleigh Amara RN CCM Case Mgmt phone [phone] 

## 2014-11-03 NOTE — Assessment & Plan Note (Signed)
Disc in detail risks of smoking and possible outcomes including copd, vascular/ heart disease, cancer , respiratory and sinus infections  Pt voices understanding Unsure if ready to quit yet  Plans to work on stopping alcohol first

## 2014-11-03 NOTE — Assessment & Plan Note (Signed)
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

## 2014-11-03 NOTE — Assessment & Plan Note (Signed)
Suspect due to etoh Enc to quit  Disc poss outcomes if not incl cirrhosis  Voiced understanding  Will continue to follow

## 2014-11-03 NOTE — Assessment & Plan Note (Signed)
Current  AST/ALT are back up  Stressed importance of quitting - voiced understanding  On MVI daily  Declines AA meetings/dislikes them  Denies any past seizures or detox symptoms in the past when she quits

## 2014-11-03 NOTE — Assessment & Plan Note (Signed)
Due for screening colonoscopy Will refer for that

## 2014-11-03 NOTE — Assessment & Plan Note (Signed)
Reviewed health habits including diet and exercise and skin cancer prevention Reviewed appropriate screening tests for age  Also reviewed health mt list, fam hx and immunization status , as well as social and family history   Labs reviewed  Ref to GI for colonosc  Counseled on alcohol and smoking cessation

## 2014-11-03 NOTE — Assessment & Plan Note (Signed)
Vitamin D level is therapeutic with current supplementation Disc importance of this to bone and overall health

## 2014-11-04 ENCOUNTER — Telehealth: Payer: Self-pay

## 2014-11-04 ENCOUNTER — Encounter (HOSPITAL_COMMUNITY): Payer: Self-pay | Admitting: General Practice

## 2014-11-04 ENCOUNTER — Inpatient Hospital Stay (HOSPITAL_COMMUNITY): Payer: No Typology Code available for payment source

## 2014-11-04 NOTE — Telephone Encounter (Signed)
Pt seen [place] and admitted to [facility].

## 2014-11-04 NOTE — Progress Notes (Addendum)
      [address]       ,[place] [address]             [phone]            Subjective: Patient with intermittent cough-long history of tobacco abuse  Objective: Vital signs in last 24 hours: Temp:  [97.6 F (36.4 C)-98.1 F (36.7 C)] 98.1 F (36.7 C) (07/25 0507) Pulse Rate:  [84-93] 84 (07/25 0507) Cardiac Rhythm:  [-] Normal sinus rhythm (07/25 0400) Resp:  [18] 18 (07/25 0507) BP: (142-154)/(69-77) 154/69 mmHg (07/25 0507) SpO2:  [98 %-100 %] 98 % (07/25 0507)     Intake/Output from previous day: 07/24 0701 - 07/25 0700 In: -  Out: 30 [Chest Tube:15]   Physical Exam:  Cardiovascular: RRR Pulmonary: Mostly clear to auscultation bilaterally Abdomen: Soft, non tender, bowel sounds present. Wounds: Clean and dry.   Chest Tube: to suction and NO air leak  Lab Results: CBC: Recent Labs  11/02/14 1521  WBC 9.3  HGB 15.0  HCT 42.9  PLT 202   BMET:  Recent Labs  11/02/14 1521  NA 140  K 4.0  CL 108  CO2 23  GLUCOSE 122*  BUN 10  CREATININE 0.55  CALCIUM 9.1    PT/INR: No results for input(s): LABPROT, INR in the last 72 hours. ABG:  INR: Will add last result for INR, ABG once components are confirmed Will add last 4 CBG results once components are confirmed  Assessment/Plan: 1. CV-SBP 140-150's. Could be related to pain at chest tube site. She does not have a history of hypertension and was not on any medications. Monitor for now. 2.  Pulmonary - On 3 liters of oxygen via [place]. Wean to room air as tolerates. Chest tube is to suction. There is no air leak. CXR this am appears to show possible trace right apical pneumothorax with minor subcutaneous emphysema right lateral chest wall. Likely place chest tube to water seal. Check CXR in am 3. Encouraged patient to STOP smoking  ZIMMERMAN,DONIELLE MPA-C 11/04/2014,7:44 AM  Chart reviewed, patient examined, agree with above. No air leak and CXR ok. CT to water seal and may remove  tomorrow.

## 2014-11-04 NOTE — Telephone Encounter (Signed)
PLEASE NOTE: All timestamps contained within this report are represented as [place] Standard Time. CONFIDENTIALTY NOTICE: This fax transmission is intended only for the addressee. It contains information that is legally privileged, confidential or otherwise protected from use or disclosure. If you are not the intended recipient, you are strictly prohibited from reviewing, disclosing, copying using or disseminating any of this information or taking any action in reliance on or regarding this information. If you have received this fax in error, please notify us immediately by telephone so that we can arrange for its return to [place]. Phone: [phone], Toll-Free: [phone], Fax: [phone] Page: 1 of 2 Call Id: [id number] [place] Patient Name: Christine Barton Gender: Female DOB: [birth year] Age: 64 Y 10 M 21 D Return Phone Number: [phone] (Primary) Address: City/State/Zip: [place] Client [place] Night - Client Client Site [facility] [place] Contact Type Call Call Type Triage / Clinical Caller Name Gwen Relationship To Patient Daughter Return Phone Number [phone] (Primary) Chief Complaint BREATHING - shortness of breath or sounds breathless Initial Comment Caller states her mother is having shortness of breath. PreDisposition InappropriateToAsk Nurse Assessment Nurse: Genoveva Ill, RN, Lattie Haw Date/Time (Eastern Time): 11/02/2014 1:51:19 PM Confirm and document reason for call. If symptomatic, describe symptoms. ---caller states mother is having chest pain and SOB; long time smoker Has the patient traveled out of the country within the last 30 days? ---No Does the patient require triage? ---Yes Related visit to physician within the last 2 weeks? ---N/A Does the PT have any chronic conditions? (i.e. diabetes, asthma,  etc.) ---No Guidelines Guideline Title Affirmed Question Affirmed Notes Nurse Date/Time Eilene Ghazi Time) Chest Pain Sounds like a lifethreatening emergency to the triager Burress, RN, Lattie Haw 11/02/2014 1:52:24 PM Disp. Time Eilene Ghazi Time) Disposition Final User 11/02/2014 1:38:35 PM Send to Urgent Queue Rocky Morel 11/02/2014 1:57:20 PM 911 Outcome Documentation Burress, RN, Lattie Haw Reason: reached VM 11/02/2014 1:54:20 PM Call EMS 911 Now Yes Burress, RN, [name] Johns Understands: Yes Disagree/Comply: Comply PLEASE NOTE: All timestamps contained within this report are represented as [place] Standard Time. CONFIDENTIALTY NOTICE: This fax transmission is intended only for the addressee. It contains information that is legally privileged, confidential or otherwise protected from use or disclosure. If you are not the intended recipient, you are strictly prohibited from reviewing, disclosing, copying using or disseminating any of this information or taking any action in reliance on or regarding this information. If you have received this fax in error, please notify us immediately by telephone so that we can arrange for its return to [place]. Phone: [phone], Toll-Free: [phone], Fax: [phone] Page: 2 of 2 Call Id: [id number] Care Advice Given Per Guideline CALL EMS 911 NOW: Immediate medical attention is needed. You need to hang up and call 911 (or an ambulance). Psychologist, forensic Discretion: I'll call you back in a few minutes to be sure you were able to reach them.) CARE ADVICE given per Chest Pain (Adult) guideline. After Care Instructions Given Call Event Type User Date / Time Description Comments User: Margaretha Sheffield, RN Date/Time Eilene Ghazi Time): 11/02/2014 1:56:33 PM Initially, pt triaged to ED and call disconnected after giving care advice, but before giving referral. When pt was reached, symptoms had worsened as she was having increased SOB with exertion; advised to call 911

## 2014-11-05 ENCOUNTER — Inpatient Hospital Stay (HOSPITAL_COMMUNITY): Payer: No Typology Code available for payment source

## 2014-11-05 MED ORDER — TRAMADOL HCL 50 MG PO TABS: 50.0000 mg | ORAL_TABLET | Freq: Four times a day (QID) | ORAL | Status: DC | PRN

## 2014-11-05 NOTE — Discharge Instructions (Signed)
Smoking Cessation Quitting smoking is important to your health and has many advantages. However, it is not always easy to quit since nicotine is a very addictive drug. Oftentimes, people try 3 times or more before being able to quit. This document explains the best ways for you to prepare to quit smoking. Quitting takes hard work and a lot of effort, but you can do it. ADVANTAGES OF QUITTING SMOKING  You will live longer, feel better, and live better.  Your body will feel the impact of quitting smoking almost immediately.  Within 20 minutes, blood pressure decreases. Your pulse returns to its normal level.  After 8 hours, carbon monoxide levels in the blood return to normal. Your oxygen level increases.  After 24 hours, the chance of having a heart attack starts to decrease. Your breath, hair, and body stop smelling like smoke.  After 48 hours, damaged nerve endings begin to recover. Your sense of taste and smell improve.  After 72 hours, the body is virtually free of nicotine. Your bronchial tubes relax and breathing becomes easier.  After 2 to 12 weeks, lungs can hold more air. Exercise becomes easier and circulation improves.  The risk of having a heart attack, stroke, cancer, or lung disease is greatly reduced.  After 1 year, the risk of coronary heart disease is cut in half.  After 5 years, the risk of stroke falls to the same as a nonsmoker.  After 10 years, the risk of lung cancer is cut in half and the risk of other cancers decreases significantly.  After 15 years, the risk of coronary heart disease drops, usually to the level of a nonsmoker.  If you are pregnant, quitting smoking will improve your chances of having a healthy baby.  The people you live with, especially any children, will be healthier.  You will have extra money to spend on things other than cigarettes. QUESTIONS TO THINK ABOUT BEFORE ATTEMPTING TO QUIT You may want to talk about your answers with your  health care provider.  Why do you want to quit?  If you tried to quit in the past, what helped and what did not?  What will be the most difficult situations for you after you quit? How will you plan to handle them?  Who can help you through the tough times? Your family? Friends? A health care provider?  What pleasures do you get from smoking? What ways can you still get pleasure if you quit? Here are some questions to ask your health care provider:  How can you help me to be successful at quitting?  What medicine do you think would be best for me and how should I take it?  What should I do if I need more help?  What is smoking withdrawal like? How can I get information on withdrawal? GET READY  Set a quit date.  Change your environment by getting rid of all cigarettes, ashtrays, matches, and lighters in your home, car, or work. Do not let people smoke in your home.  Review your past attempts to quit. Think about what worked and what did not. GET SUPPORT AND ENCOURAGEMENT You have a better chance of being successful if you have help. You can get support in many ways.  Tell your family, friends, and coworkers that you are going to quit and need their support. Ask them not to smoke around you.  Get individual, group, or telephone counseling and support. Programs are available at [employer] and health centers. Call  your local health department for information about programs in your area.  Spiritual beliefs and practices may help some smokers quit.  Download a "quit meter" on your computer to keep track of quit statistics, such as how long you have gone without smoking, cigarettes not smoked, and money saved.  Get a self-help book about quitting smoking and staying off tobacco. [place] yourself from urges to smoke. Talk to someone, go for a walk, or occupy your time with a task.  Change your normal routine. Take a different route to work.  Drink tea instead of coffee. Eat breakfast in a different place.  Reduce your stress. Take a hot bath, exercise, or read a book.  Plan something enjoyable to do every day. Reward yourself for not smoking.  Explore interactive web-based programs that specialize in helping you quit. GET MEDICINE AND USE IT CORRECTLY Medicines can help you stop smoking and decrease the urge to smoke. Combining medicine with the above behavioral methods and support can greatly increase your chances of successfully quitting smoking.  Nicotine replacement therapy helps deliver nicotine to your body without the negative effects and risks of smoking. Nicotine replacement therapy includes nicotine gum, lozenges, inhalers, nasal sprays, and skin patches. Some may be available over-the-counter and others require a prescription.  Antidepressant medicine helps people abstain from smoking, but how this works is unknown. This medicine is available by prescription.  Nicotinic receptor partial agonist medicine simulates the effect of nicotine in your brain. This medicine is available by prescription. Ask your health care provider for advice about which medicines to use and how to use them based on your health history. Your health care provider will tell you what side effects to look out for if you choose to be on a medicine or therapy. Carefully read the information on the package. Do not use any other product containing nicotine while using a nicotine replacement product.  RELAPSE OR DIFFICULT SITUATIONS Most relapses occur within the first 3 months after quitting. Do not be discouraged if you start smoking again. Remember, most people try several times before finally quitting. You may have symptoms of withdrawal because your body is used to nicotine. You may crave cigarettes, be irritable, feel very hungry, cough often, get headaches, or have difficulty concentrating. The withdrawal symptoms are only temporary. They are strongest  when you first quit, but they will go away within 10-14 days. To reduce the chances of relapse, try to:  Avoid drinking alcohol. Drinking lowers your chances of successfully quitting.  Reduce the amount of caffeine you consume. Once you quit smoking, the amount of caffeine in your body increases and can give you symptoms, such as a rapid heartbeat, sweating, and anxiety.  Avoid smokers because they can make you want to smoke.  Do not let weight gain distract you. Many smokers will gain weight when they quit, usually less than 10 pounds. Eat a healthy diet and stay active. You can always lose the weight gained after you quit.  Find ways to improve your mood other than smoking. FOR MORE INFORMATION  www.smokefree.gov  Document Released: 03/23/2001 Document Revised: 08/13/2013 Document Reviewed: 07/08/2011 [facility] Patient Information 2015 [place], [place]. This information is not intended to replace advice given to you by your health care provider. Make sure you discuss any questions you have with your health care provider.     Pneumothorax A pneumothorax, commonly called a collapsed lung, is a condition in which air leaks from a lung  and builds up in the space between the lung and the chest wall (pleural space). The air in a pneumothorax is trapped outside the lung and takes up space, preventing the lung from fully expanding. This is a condition that usually occurs suddenly. The buildup of air may be small or large. A small pneumothorax may go away on its own. When a pneumothorax is larger, it will often require medical treatment and hospitalization.  CAUSES  A pneumothorax can sometimes happen quickly with no apparent cause. People with underlying lung problems, particularly COPD or emphysema, are at higher risk of pneumothorax. However, pneumothorax can happen quickly even in people with no prior known lung problems. Trauma, surgery, medical procedures, or injury to the chest wall can also  cause a pneumothorax. SIGNS AND SYMPTOMS  Sometimes a pneumothorax will have no symptoms. When symptoms are present, they can include:  Chest pain.  Shortness of breath.  Increased rate of breathing.  Bluish color to your lips or skin (cyanosis). DIAGNOSIS  Pneumothorax is usually diagnosed by a chest X-ray or chest CT scan. Your health care provider will also take a medical history and perform a physical exam to determine why you may have a pneumothorax. TREATMENT  A small pneumothorax may go away on its own without treatment. Extra oxygen can sometimes help a small pneumothorax go away more quickly. For a larger pneumothorax or a pneumothorax that is causing symptoms, a procedure is usually needed to drain the air.In some cases, the health care provider may drain the air using a needle. In other cases, a chest tube may be inserted into the pleural space. A chest tube is a small tube placed between the ribs and into the pleural space. This removes the extra air and allows the lung to expand back to its normal size. A large pneumothorax will usually require a hospital stay. If there is ongoing air leakage into the pleural space, then the chest tube may need to remain in place for several days until the air leak has healed. In some cases, surgery may be needed.  HOME CARE INSTRUCTIONS   Only take over-the-counter or prescription medicines as directed by your health care provider.  If a cough or pain makes it difficult for you to sleep at night, try sleeping in a semi-upright position in a recliner or by using 2 or 3 pillows.  Rest and limit activity as directed by your health care provider.  If you had a chest tube and it was removed, ask your health care provider when it is okay to remove the dressing. Until your health care provider says you can remove the dressing, do not allow it to get wet.  Do not smoke. Smoking is a risk factor for pneumothorax.  Do not fly in an airplane or scuba  dive until your health care provider says it is okay.  Follow up with your health care provider as directed. SEEK IMMEDIATE MEDICAL CARE IF:   You have increasing chest pain or shortness of breath.  You have a cough that is not controlled with suppressants.  You begin coughing up blood.  You have pain that is getting worse or is not controlled with medicines.  You cough up thick, discolored mucus (sputum) that is yellow to green in color.  You have redness, increasing pain, or discharge at the site where a chest tube had been in place (if your pneumothorax was treated with a chest tube).  The site where your chest tube was located  opens up.  You feel air coming out of the site where the chest tube was placed.  You have a fever or persistent symptoms for more than 2-3 days.  You have a fever and your symptoms suddenly get worse. MAKE SURE YOU:   Understand these instructions.  Will watch your condition.  Will get help right away if you are not doing well or get worse. Document Released: 03/29/2005 Document Revised: 01/17/2013 Document Reviewed: 10/26/2012 [facility] Patient Information 2015 [place], [place]. This information is not intended to replace advice given to you by your health care provider. Make sure you discuss any questions you have with your health care provider.

## 2014-11-05 NOTE — Discharge Summary (Signed)
Physician Discharge Summary       [address]       ,[place] [address]             [phone]    Patient ID: Christine Barton MRN: [id number] DOB/AGE: [birth year] 64 y.o.  Admit date: 11/02/2014 Discharge date: 11/05/2014  Admission Diagnoses: 1. Spontaneous right pneumothorax 2. History of tobacco abuse 3. History of breast cancer 4. History of cervical spondylosis 5. History of alcohol abuse  Discharge Diagnoses:  1. Spontaneous right pneumothorax 2. History of tobacco abuse 3. History of breast cancer 4. History of cervical spondylosis 5. History of alcohol abuse  Procedure (s):  A 20 French tube was placed in the right anterior 6th rib space by Dr. Cyndia Bent on 11/02/2014.  History of Presenting Illness: The patient is a 64 year old woman who smokes one and a half packs of cigarettes per day for many years and developed SOB and right chest pain at work today. She was brought in by EMS with sats of 90% on room air with sinus tach 120's. CXR showed a large right ptx with slight tension.   Brief Hospital Course:  Dr. Cyndia Bent placed a right chest tube. Follow up CXR showed  no pneumothorax. Chest tube did not have an air leak. It was placed to water seal on 11/04/2014. Chest xray remained stable. Chest tube was removed on 07/26. Follow up CXR showed no pneumothorax.Patient was counseled on importance of smoking cessation. She was ambulating on room air. She was felt surgically stable for discharge today.  Latest Vital Signs: Blood pressure 129/90, pulse 80, temperature 97.7 F (36.5 C), temperature source Oral, resp. rate 17, height 5' 5"  (1.651 m), weight 125 lb 3.5 oz (56.8 kg), SpO2 99 %.  Physical Exam: Cardiovascular: RRR Pulmonary: Mostly clear to auscultation bilaterally Abdomen: Soft, non tender, bowel sounds present. Wounds: Clean and dry.  Chest Tube: to water seal and NO air leak  Discharge Condition:Stable and discharged to home  Recent  laboratory studies:  Lab Results  Component Value Date   WBC 9.3 11/02/2014   HGB 15.0 11/02/2014   HCT 42.9 11/02/2014   MCV 101.9* 11/02/2014   PLT 202 11/02/2014   Lab Results  Component Value Date   NA 140 11/02/2014   K 4.0 11/02/2014   CL 108 11/02/2014   CO2 23 11/02/2014   CREATININE 0.55 11/02/2014   GLUCOSE 122* 11/02/2014      Diagnostic Studies: Dg Chest Port 1 View EXAM: CHEST 2 VIEW  COMPARISON: November 05, 2014  FINDINGS: The heart size and mediastinal contours are within normal limits. Previously noted right chest tube has been removed. Previously noted pleural line in the right apex is not seen currently. There is no evidence of pneumothorax. There is no focal pneumonia or pleural effusion. Right chest subcutaneous emphysema is unchanged. The visualized skeletal structures are stable.  IMPRESSION: Previously noted right chest tube is remove. No definite pleural line is identified to suggest pneumothorax.   Electronically Signed  By: Abelardo Diesel M.D.  On: 11/05/2014 14:45   Mm Digital Screening Bilateral  10/30/2014   CLINICAL DATA:  Screening. Right lumpectomy 1998. Patient was treated with lumpectomy, chemotherapy, and radiation therapy.  EXAM: DIGITAL SCREENING BILATERAL MAMMOGRAM WITH CAD  COMPARISON:  Previous exam(s).  ACR Breast Density Category b: There are scattered areas of fibroglandular density.  FINDINGS: There are no findings suspicious for malignancy. Postoperative changes are seen in the right breast. Images were processed  with CAD.  IMPRESSION: No mammographic evidence of malignancy. A result letter of this screening mammogram will be mailed directly to the patient.  RECOMMENDATION: Screening mammogram in one year. (Code:SM-B-01Y)  BI-RADS CATEGORY  1: Negative.   Electronically Signed   By: Nolon Nations M.D.   On: 10/30/2014 10:09    Discharge Medications:   Medication List    STOP taking these medications        nystatin  100000 UNIT/ML suspension  Commonly known as:  MYCOSTATIN      TAKE these medications        cyclobenzaprine 10 MG tablet  Commonly known as:  FLEXERIL  TAKE ONE TABLET BY MOUTH THREE TIMES DAILY AS NEEDED FOR MUSCLE SPASMS     indomethacin 50 MG capsule  Commonly known as:  INDOCIN  Take 1 capsule (50 mg total) by mouth 3 (three) times daily with meals. As needed for pain     multivitamin with minerals Tabs tablet  Take 1 tablet by mouth daily.     mupirocin ointment 2 %  Commonly known as:  BACTROBAN  APPLY A SMALL AMOUNT TO AFFECTED AREA ONINSIDE OF NOSE 2 TIMES DAILY AS DIRECTED     ranitidine 150 MG tablet  Commonly known as:  ZANTAC  TAKE ONE (1) TABLET BY MOUTH TWO (2) TIMES DAILY     traMADol 50 MG tablet  Commonly known as:  ULTRAM  Take 1-2 tablets (50-100 mg total) by mouth every 6 (six) hours as needed.        Follow Up Appointments: Follow-up Information    Follow up with Gaye Pollack, MD On 11/20/2014.   Specialty:  Cardiothoracic Surgery   Why:  PA/LAT CXR to be taken (at [facility] which is in the same building as Dr. Vivi Martens office) on 11/20/2014 at 10:45 am;Appointment time is at 11:30 am   Contact information:   [address] [address] [phone]       Signed: Lars Pinks MPA-C 11/05/2014, 3:10 PM

## 2014-11-05 NOTE — Progress Notes (Signed)
      [address]       [place] [address]             [phone]            Subjective: Patient receiving breathing treatment this am.  Objective: Vital signs in last 24 hours: Temp:  [98 F (36.7 C)-98.3 F (36.8 C)] 98.3 F (36.8 C) (07/26 0358) Pulse Rate:  [84-90] 90 (07/26 0358) Cardiac Rhythm:  [-] Normal sinus rhythm (07/25 1915) Resp:  [18] 18 (07/26 0358) BP: (156-159)/(68-87) 159/87 mmHg (07/26 0358) SpO2:  [94 %-96 %] 96 % (07/26 0358)     Intake/Output from previous day: 07/25 0701 - 07/26 0700 In: -  Out: 5 [Chest Tube:5]   Physical Exam:  Cardiovascular: RRR Pulmonary: Mostly clear to auscultation bilaterally Abdomen: Soft, non tender, bowel sounds present. Wounds: Clean and dry.   Chest Tube: to water seal and NO air leak  Lab Results: CBC:  Recent Labs  11/02/14 1521  WBC 9.3  HGB 15.0  HCT 42.9  PLT 202   BMET:   Recent Labs  11/02/14 1521  NA 140  K 4.0  CL 108  CO2 23  GLUCOSE 122*  BUN 10  CREATININE 0.55  CALCIUM 9.1    PT/INR: No results for input(s): LABPROT, INR in the last 72 hours. ABG:  INR: Will add last result for INR, ABG once components are confirmed Will add last 4 CBG results once components are confirmed  Assessment/Plan: 1. CV-SBP 140-150's. Could be related to pain at chest tube site. She does not have a history of hypertension and was not on any medications. Monitor for now. 2.  Pulmonary - On room air.Chest tube is to water seal. There is no air leak. CXR this am appears stable. Likely remove chest tube.Check CXR in am 3. Encouraged patient to STOP smoking  ZIMMERMAN,DONIELLE MPA-C 11/05/2014,7:27 AM

## 2014-11-05 NOTE — Progress Notes (Signed)
Chest tube removed without difficulty per protocol. Pt tolerated well. Vital signs and respiratory status stable pre and post removal.

## 2014-11-07 ENCOUNTER — Telehealth: Payer: Self-pay | Admitting: *Deleted

## 2014-11-07 NOTE — Telephone Encounter (Signed)
Transitional care attempted.  Left message for patient to return call.

## 2014-11-08 NOTE — Telephone Encounter (Signed)
Transitional care call attempted.  Left message for patient to return call.

## 2014-11-11 NOTE — Telephone Encounter (Signed)
Transitional care call attempted.  Left message for patient to return call.

## 2014-11-12 NOTE — Telephone Encounter (Signed)
Pt called back. She is at work until 330, call [phone] ext [phone]

## 2014-11-12 NOTE — Telephone Encounter (Signed)
Transition Care Management Follow-up Telephone Call   Date discharged? 11/05/14   How have you been since you were released from the hospital? Patient reports she is doing very well, overall much improved.   Do you understand why you were in the hospital? yes   Do you understand the discharge instructions? yes   Where were you discharged to? Home   Items Reviewed:  Medications reviewed: yes  Allergies reviewed: yes  Dietary changes reviewed: n/a  Referrals reviewed: yes, cardiothoracic surgery    Functional Questionnaire:   Activities of Daily Living (ADLs):   She states they are independent in the following: ambulation, bathing and hygiene, feeding, continence, grooming, toileting and dressing States they require assistance with the following: None, back to baseline   Any transportation issues/concerns?: no   Any patient concerns? no   Confirmed importance and date/time of follow-up visits scheduled yes, 11/13/14 at 1700  Provider Appointment booked with Loura Pardon, MD  Confirmed with patient if condition begins to worsen call PCP or go to the ER.  Patient was given the office number and encouraged to call back with question or concerns.  : yes

## 2014-11-12 NOTE — Telephone Encounter (Signed)
Called patient at work number provided.  No answer.  Left message on work voice mail.

## 2014-11-13 ENCOUNTER — Ambulatory Visit: Payer: No Typology Code available for payment source | Admitting: Family Medicine

## 2014-11-18 ENCOUNTER — Other Ambulatory Visit: Payer: Self-pay | Admitting: Surgery

## 2014-11-18 DIAGNOSIS — J939 Pneumothorax, unspecified: Secondary | ICD-10-CM

## 2014-11-19 ENCOUNTER — Ambulatory Visit: Payer: No Typology Code available for payment source | Admitting: Surgery

## 2014-11-20 ENCOUNTER — Ambulatory Visit: Payer: No Typology Code available for payment source | Admitting: Surgery

## 2014-11-20 ENCOUNTER — Ambulatory Visit: Payer: No Typology Code available for payment source

## 2014-11-20 DIAGNOSIS — J939 Pneumothorax, unspecified: Secondary | ICD-10-CM | POA: Diagnosis not present

## 2014-11-20 DIAGNOSIS — Z4802 Encounter for removal of sutures: Secondary | ICD-10-CM

## 2014-11-20 NOTE — Progress Notes (Signed)
Removed 1 suture from chest tube site with no signs of infection and patient tolerated well.

## 2014-11-29 ENCOUNTER — Encounter: Payer: PRIVATE HEALTH INSURANCE | Admitting: Internal Medicine

## 2014-12-10 ENCOUNTER — Ambulatory Visit (INDEPENDENT_AMBULATORY_CARE_PROVIDER_SITE_OTHER): Payer: No Typology Code available for payment source | Admitting: Surgery

## 2014-12-10 ENCOUNTER — Ambulatory Visit
Admission: RE | Admit: 2014-12-10 | Discharge: 2014-12-10 | Disposition: A | Discharge status: Home / Self Care | Payer: No Typology Code available for payment source | Source: Ambulatory Visit | Attending: Surgery | Admitting: Surgery

## 2014-12-10 ENCOUNTER — Encounter: Payer: Self-pay | Admitting: Surgery

## 2014-12-10 VITALS — BP 150/80 | HR 74 | Resp 20 | Ht 65.0 in | Wt 126.0 lb

## 2014-12-10 DIAGNOSIS — J939 Pneumothorax, unspecified: Secondary | ICD-10-CM | POA: Diagnosis not present

## 2014-12-10 NOTE — Progress Notes (Signed)
     HPI:  She returns today for follow up of a spontaneous right ptx with some tension on 11/02/2014 treated with a right chest tube. There was no air leak after chest tube insertion and it was removed in 48 hrs. She has been feeling well and says she stopped smoking.  Current Outpatient Prescriptions  Medication Sig Dispense Refill  . cyclobenzaprine (FLEXERIL) 10 MG tablet TAKE ONE TABLET BY MOUTH THREE TIMES DAILY AS NEEDED FOR MUSCLE SPASMS 90 tablet 1  . indomethacin (INDOCIN) 50 MG capsule Take 1 capsule (50 mg total) by mouth 3 (three) times daily with meals. As needed for pain (Patient taking differently: Take 50 mg by mouth daily with breakfast. ) 90 capsule 3  . Multiple Vitamin (MULTIVITAMIN WITH MINERALS) TABS tablet Take 1 tablet by mouth daily.    . mupirocin ointment (BACTROBAN) 2 % APPLY A SMALL AMOUNT TO AFFECTED AREA ONINSIDE OF NOSE 2 TIMES DAILY AS DIRECTED (Patient taking differently: APPLY A SMALL AMOUNT TO AFFECTED AREA ON  NOSE OR RECTUM 2 TIMES DAILY AS NEEDED FOR WOUND CARE) 22 g 0  . ranitidine (ZANTAC) 150 MG tablet TAKE ONE (1) TABLET BY MOUTH TWO (2) TIMES DAILY 60 tablet 1  . traMADol (ULTRAM) 50 MG tablet Take 1-2 tablets (50-100 mg total) by mouth every 6 (six) hours as needed. 30 tablet 0   No current facility-administered medications for this visit.     Physical Exam: BP 150/80 mmHg  Pulse 74  Resp 20  Ht 5' 5"  (1.651 m)  Wt 126 lb (57.153 kg)  BMI 20.97 kg/m2  SpO2 98% She looks well Lungs are clear The chest tube site is well-healed  Diagnostic Tests:  CLINICAL DATA: Pneumothorax, follow-up. No chest complaints today.  EXAM: CHEST 2 VIEW  COMPARISON: 11/05/2014  FINDINGS: Resolution of the previously seen right subcutaneous emphysema. No visible pneumothorax. Surgical clips in the right axilla. Heart is normal size. Lungs are clear except for biapical scarring. No effusions. No acute bony abnormality.  IMPRESSION: No visible  pneumothorax. Resolution of the right subcutaneous emphysema. No active disease.   Electronically Signed  By: Rolm Baptise M.D.  On: 12/10/2014 15:18   Impression:  The right spontaneous ptx is resolved. She understands that there is a 20% chance of recurrence and that not smoking is the most important thing she can do to try to prevent recurrence.  Plan:  She will follow-up with her PCP and will call my office if she develops recurrent chest pain or shortness of breath similar to what she had with her ptx so that she can get another chest xray.   Gaye Pollack, MD [facility] [phone]

## 2014-12-11 ENCOUNTER — Ambulatory Visit: Payer: No Typology Code available for payment source | Admitting: Surgery

## 2014-12-13 ENCOUNTER — Encounter: Payer: Self-pay | Admitting: Gastroenterology

## 2015-01-26 ENCOUNTER — Telehealth: Payer: Self-pay | Admitting: Family Medicine

## 2015-01-26 DIAGNOSIS — E1165 Type 2 diabetes mellitus with hyperglycemia: Principal | ICD-10-CM

## 2015-01-26 DIAGNOSIS — R748 Abnormal levels of other serum enzymes: Secondary | ICD-10-CM

## 2015-01-26 DIAGNOSIS — IMO0001 Reserved for inherently not codable concepts without codable children: Secondary | ICD-10-CM

## 2015-01-26 NOTE — Telephone Encounter (Signed)
-----   Message from Ellamae Sia sent at 01/21/2015  4:18 PM EDT ----- Regarding: Lab orders for Monday, 10.17.16 Lab orders for a 3 month follow up appt.

## 2015-01-27 ENCOUNTER — Other Ambulatory Visit: Payer: PRIVATE HEALTH INSURANCE

## 2015-01-30 ENCOUNTER — Other Ambulatory Visit: Payer: Self-pay | Admitting: Family Medicine

## 2015-01-30 NOTE — Telephone Encounter (Signed)
Electronic refill request, pt had f/u on 02/03/15, please advise

## 2015-01-31 NOTE — Telephone Encounter (Signed)
done

## 2015-01-31 NOTE — Telephone Encounter (Signed)
Please refill for 6 mo each ,thanks

## 2015-02-03 ENCOUNTER — Ambulatory Visit: Payer: PRIVATE HEALTH INSURANCE | Admitting: Family Medicine

## 2015-02-10 ENCOUNTER — Encounter: Payer: No Typology Code available for payment source | Admitting: Gastroenterology

## 2016-02-10 ENCOUNTER — Encounter: Payer: Self-pay | Admitting: *Deleted

## 2016-02-18 ENCOUNTER — Telehealth: Payer: Self-pay | Admitting: Family Medicine

## 2016-02-18 NOTE — Telephone Encounter (Signed)
Patient called and said she received her flu shot at work on 02/11/16.

## 2016-02-19 NOTE — Telephone Encounter (Signed)
Chart updated

## 2016-05-25 ENCOUNTER — Ambulatory Visit: Payer: Self-pay | Admitting: Registered Nurse

## 2016-05-25 VITALS — BP 122/70 | HR 102 | Temp 97.9°F

## 2016-05-25 DIAGNOSIS — R2241 Localized swelling, mass and lump, right lower limb: Secondary | ICD-10-CM

## 2016-05-25 DIAGNOSIS — M25561 Pain in right knee: Secondary | ICD-10-CM

## 2016-05-25 DIAGNOSIS — M7652 Patellar tendinitis, left knee: Secondary | ICD-10-CM

## 2016-05-25 DIAGNOSIS — M7651 Patellar tendinitis, right knee: Secondary | ICD-10-CM

## 2016-05-25 NOTE — Progress Notes (Signed)
Pt returning for f/u regarding R knee pain. Was seen in clinic at end of January. Dx patellar tendonitis. Given exercises to perform along with cryotherapy and Ibuprofen 446m TID x2 weeks. Reports pain slightly improved. Pain only occurs when putting pressure on front of R knee, which is primarily only when climbing in her bed, which sits high. Concerned about a "knot" or nodule that she reports protrudes from under patella that she believes is the source of the pain.

## 2016-05-25 NOTE — Patient Instructions (Signed)
Continue patellar tendonitis exercises Cryotherapy 15 minutes three times per day Wear supportive shoes at work

## 2016-05-25 NOTE — Progress Notes (Signed)
Subjective:    Patient ID: Christine Barton, female    DOB: 1950-12-02, 66 y.o.   MRN: [id number]  65y/o single caucasian female here to follow up right knee pain and bump on right knee cap.  Last seen 05/04/2016 prescribed motrin 447m po TID x 2 weeks, cryotherapy 15 minutes TID and patellar tendonitis stretches.  Patient reported pain with kneeling on right knee and popping/pain decreased but bump still there.  She would like knee xray to make sure the bump isn't a problem.  It has not increased or decreased in size.  Decreased pain with pressure/palpation direction on it since last visit.  Denied rash, increased temperature, swelling, giving out, locking up, and/or weakness.      Review of Systems  Constitutional: Negative for chills and fever.  HENT: Negative for congestion, ear pain and sore throat.   Eyes: Negative for pain and discharge.  Respiratory: Negative for cough and wheezing.   Cardiovascular: Negative for chest pain and leg swelling.  Gastrointestinal: Negative for blood in stool, constipation, diarrhea, nausea and vomiting.  Endocrine: Negative for cold intolerance and heat intolerance.  Genitourinary: Negative for difficulty urinating, dysuria and hematuria.  Musculoskeletal: Negative for arthralgias, back pain, gait problem, joint swelling, myalgias, neck pain and neck stiffness.  Skin: Negative for rash.  Allergic/Immunologic: Negative for environmental allergies and food allergies.  Neurological: Negative for dizziness, tremors, seizures, syncope, facial asymmetry, speech difficulty, weakness, light-headedness, numbness and headaches.  Hematological: Negative for adenopathy. Does not bruise/bleed easily.  Psychiatric/Behavioral: Negative for agitation and confusion. The patient is not nervous/anxious.        Objective:   Physical Exam  Constitutional: She is oriented to person, place, and time. Vital signs are normal. She appears well-developed and well-nourished.   Non-toxic appearance. She does not have a sickly appearance. She does not appear ill. No distress.  HENT:  Head: Normocephalic and atraumatic.  Right Ear: Hearing and external ear normal.  Left Ear: Hearing and external ear normal.  Nose: Nose normal. No rhinorrhea. No epistaxis.  Mouth/Throat: Uvula is midline, oropharynx is clear and moist and mucous membranes are normal. No oropharyngeal exudate, posterior oropharyngeal edema or posterior oropharyngeal erythema.  Eyes: Conjunctivae, EOM and lids are normal. Pupils are equal, round, and reactive to light. Right eye exhibits no discharge. Left eye exhibits no discharge. No scleral icterus.  Neck: Trachea normal and normal range of motion. Neck supple. No tracheal deviation present.  Cardiovascular: Normal rate, regular rhythm and intact distal pulses.   Pulses:      Popliteal pulses are 2+ on the right side, and 2+ on the left side.  Pulmonary/Chest: Effort normal and breath sounds normal. No stridor. No respiratory distress. She has no wheezes. She has no rales.  Abdominal: Soft. She exhibits no distension. There is no guarding.  Musculoskeletal: Normal range of motion. She exhibits no edema or deformity.       Right shoulder: Normal.       Left shoulder: Normal.       Right elbow: Normal.      Left elbow: Normal.       Right hip: Normal.       Left hip: Normal.       Right knee: She exhibits normal range of motion, no swelling, no effusion, no ecchymosis, no deformity, no laceration, no erythema, normal alignment, no LCL laxity, normal patellar mobility, no bony tenderness, normal meniscus and no MCL laxity. Tenderness found. Patellar tendon tenderness noted. No medial  joint line, no lateral joint line, no MCL and no LCL tenderness noted.       Left knee: Normal.       Right ankle: Normal.       Left ankle: Normal.       Cervical back: Normal.       Right hand: Normal.       Left hand: Normal.       Legs: Negative bilateral mcmurrays,  anterior/posterior drawer, patellar apprehension, valgus/varus laxity, lachmann's; patient on off exam table and in/out chair without difficulty no limp with walking in hall gait smooth and steady; bilateral crepitus with knee flexion /extension intermittent; not ttp bilateral posterior fossas  Lymphadenopathy:    She has no cervical adenopathy.  Neurological: She is alert and oriented to person, place, and time. She is not disoriented. She displays no atrophy, no tremor and normal reflexes. No cranial nerve deficit or sensory deficit. She exhibits normal muscle tone. She displays no seizure activity. Coordination and gait normal. GCS eye subscore is 4. GCS verbal subscore is 5. GCS motor subscore is 6.  Skin: Skin is warm, dry and intact. No rash noted. No erythema. No pallor.  Psychiatric: She has a normal mood and affect. Her speech is normal and behavior is normal. Judgment and thought content normal. Cognition and memory are normal.  Nursing note and vitals reviewed.         Assessment & Plan:  A- patellar nodule right and bilateral patellar tendonitis  P- Xray right knee to rule out arthritis, degenerative changes, cancer as nodule x 1 month on patella and pain with kneeling/pressure on affected area.  Pain improved with motrin 437m po TID prn, cryotherapy 15 minutes TID and patellar tendonitis stretches.  DIscussed with patient nodule probably related to calcium deposit after inflammation patellar tendon.  Will call with xray results once available [facility]  Discussed with patient arthritis, gout, pseudogout could also cause degenerative changes in joints.  I do not believe this is related to her worker's compensation injury Jul 2017 as nodule did not appear until 6 months later.  Wear supportive footwear, continue knee exercises.  Avoid weight gain.  Patient verbalized understanding information/instructions, agreed with plan of care and had no further questions at this  time.

## 2016-06-11 ENCOUNTER — Telehealth: Payer: Self-pay | Admitting: Family Medicine

## 2016-06-11 NOTE — Telephone Encounter (Signed)
Attempted to call pt. No vm. Pt is due for Welcome to [facility] exam

## 2016-07-13 ENCOUNTER — Encounter (INDEPENDENT_AMBULATORY_CARE_PROVIDER_SITE_OTHER): Payer: Self-pay

## 2016-07-13 ENCOUNTER — Encounter: Payer: Self-pay | Admitting: Primary Care

## 2016-07-13 ENCOUNTER — Ambulatory Visit (INDEPENDENT_AMBULATORY_CARE_PROVIDER_SITE_OTHER): Payer: Medicare PPO | Admitting: Primary Care

## 2016-07-13 VITALS — BP 136/74 | HR 110 | Temp 98.0°F | Ht 65.0 in | Wt 133.4 lb

## 2016-07-13 DIAGNOSIS — N949 Unspecified condition associated with female genital organs and menstrual cycle: Secondary | ICD-10-CM | POA: Diagnosis not present

## 2016-07-13 MED ORDER — VALACYCLOVIR HCL 1 G PO TABS: 1000.0000 mg | ORAL_TABLET | Freq: Two times a day (BID) | ORAL | 0 refills | Status: DC

## 2016-07-13 NOTE — Progress Notes (Signed)
Subjective:    Patient ID: Christine Barton, female    DOB: 09-15-1950, 66 y.o.   MRN: [id number]  HPI  Christine Barton is a 66 year old female who presents today with a chief complaint of genital sores. She has a history of genital herpes that was diagnosed in 2016. Historically she's been applying Mupirocin cream with slow, but eventual improvement.   She first noticed her recent sores for the past two weeks. She's been using the Mupirocin cream with improvement. She is interested in better and faster treatment as she remembers being treated with Valtrex in the past with quick resolve. She experiences outbreaks 2-3 times annually. She does experience pain with sores and has pain today. She denies vaginal itching/discharge, urinary symptoms.   Review of Systems  Constitutional: Negative for fever.  Genitourinary: Positive for genital sores. Negative for dysuria, hematuria, pelvic pain and vaginal discharge.  Skin: Negative for color change.       Past Medical History:  Diagnosis Date  . Alcohol abuse, unspecified   . Cervical spondylosis 2006   MRI  . Degeneration of cervical intervertebral disc 2006   MRI  . Diverticulosis of colon (without mention of hemorrhage)   . Hyperpotassemia   . Microscopic hematuria   . Mononeuritis of unspecified site   . Nonspecific abnormal results of liver function study   . Other abnormal glucose   . Other and unspecified hyperlipidemia   . Other chronic nonalcoholic liver disease   . Personal history of malignant neoplasm of breast   . Pneumothorax, acute    right, spontaneous  . Tobacco use disorder   . Unspecified vitamin D deficiency      Social History   Social History  . Marital status: Single    Spouse name: N/A  . Number of children: 1  . Years of education: N/A   Occupational History  .  [employer]   Social History Main Topics  . Smoking status: Former Smoker    Packs/day: 1.00    Types: Cigarettes    Quit date:  11/02/2014  . Smokeless tobacco: Never Used  . Alcohol use Yes     Comment: Moderate  . Drug use: No  . Sexual activity: Not on file   Other Topics Concern  . Not on file   Social History Narrative   Divorced      1 child      Works at [employer]          Past Surgical History:  Procedure Laterality Date  . BREAST BIOPSY  9/03   Right  . BREAST LUMPECTOMY    . BREAST LUMPECTOMY  1998  . CHEST TUBE INSERTION  11/02/2014  . COLONOSCOPY    . TUBAL LIGATION      Family History  Problem Relation Age of Onset  . Heart failure Father     Allergies  Allergen Reactions  . Oxycodone Nausea And Vomiting  . Glipizide Other (See Comments)    Stomach pain  . Metformin And Related Other (See Comments)    Stomach pain    Current Outpatient Prescriptions on File Prior to Visit  Medication Sig Dispense Refill  . Multiple Vitamin (MULTIVITAMIN WITH MINERALS) TABS tablet Take 1 tablet by mouth daily.    . cyclobenzaprine (FLEXERIL) 10 MG tablet TAKE ONE TABLET BY MOUTH THREE TIMES DAILY AS NEEDED FOR MUSCLE SPASMS (Patient not taking: Reported on 07/13/2016) 90 tablet 1  . indomethacin (INDOCIN) 50 MG capsule TAKE  1 CAPSULE BY MOUTH THREE TIMES A DAY WITH MEALS AS NEEDED FOR PAIN (Patient not taking: Reported on 07/13/2016) 90 capsule 5   No current facility-administered medications on file prior to visit.     BP 136/74   Pulse (!) 110   Temp 98 F (36.7 C) (Oral)   Ht 5' 5"  (1.651 m)   Wt 133 lb 6.4 oz (60.5 kg)   SpO2 99%   BMI 22.20 kg/m    Objective:   Physical Exam  Constitutional: She appears well-nourished.  Cardiovascular: Normal rate and regular rhythm.   Pulmonary/Chest: Effort normal and breath sounds normal.  Genitourinary: There is tenderness on the right labia. There is no rash or lesion on the right labia. There is no tenderness or lesion on the left labia. There is tenderness in the vagina. No erythema in the vagina. No vaginal discharge found.    Genitourinary Comments: Tenderness to right lower external labia, no obvious lesion.   Skin: Skin is warm and dry.          Assessment & Plan:  Genital Pain:  Endorses history of HSV, no evidence of this in chart. No obvious lesion today, but is tender to right labia. Symptoms do represent genital herpes. Will treat with Valtrex course. Seems to have infrequent breakouts. Recommended formal testing for HSV, she declines today and will have this done during her upcoming CPE blood work.  Follow up PRN.  Sheral Flow, NP

## 2016-07-13 NOTE — Patient Instructions (Addendum)
Start valacyclovir 1000 mg tablets for likely genital herpes outbreak. Take 1 tablet by mouth twice daily for 7 days.  I recommend formal STD testing through lab work including Herpes, HIV, Syphilis, Hepatitis C.   Please schedule your Welcome to Medicare Visit with our nurse Katha Cabal.  It was a pleasure meeting you!

## 2016-07-13 NOTE — Progress Notes (Signed)
Pre visit review using our clinic review tool, if applicable. No additional management support is needed unless otherwise documented below in the visit note. 

## 2016-07-13 NOTE — Telephone Encounter (Signed)
Scheduled 10/26/16

## 2016-07-16 ENCOUNTER — Telehealth: Payer: Self-pay | Admitting: Family Medicine

## 2016-07-16 NOTE — Telephone Encounter (Signed)
Pt left note at front desk requesting refill cyclobenzaprine (last refilled #90 x 1 06/10/14) and indomethacin (last refilled #90 x 5 on 02/05/15). And pt wants mupirocin cream instead of ointment. Last annual 11/01/14; last acute 07/13/16. And has welcome to medicare visit on 10/26/16.Please advise.

## 2016-07-18 NOTE — Telephone Encounter (Signed)
Please refill until seen- and cream is ok to subs for ointment

## 2016-07-19 MED ORDER — MUPIROCIN CALCIUM 2 % EX CREA: TOPICAL_CREAM | CUTANEOUS | 2 refills | Status: DC

## 2016-07-19 NOTE — Telephone Encounter (Signed)
done

## 2016-07-20 NOTE — Telephone Encounter (Signed)
Pt went to pick up mupirocin cream and cost to pt was $300.00; pt did not pick up cream; I asked the pt why she requested the change to cream and she thought the cream would do a better job but due to cost pt requesting to switch back to ointment. Pt also wants to know when PA for indomethacin and cyclobenzaprine is complete; Chrys Racer at [facility] said the PA s for these 2 meds were faxed to [facility] today; [place] said her faxes went to [place]. Please advise.

## 2016-07-20 NOTE — Addendum Note (Signed)
Addended by: Helene Shoe on: 07/20/2016 05:03 PM   Modules accepted: Orders

## 2016-07-20 NOTE — Telephone Encounter (Signed)
Please change it back to ointment  Thanks

## 2016-07-21 MED ORDER — MUPIROCIN 2 % EX OINT: TOPICAL_OINTMENT | CUTANEOUS | 2 refills | Status: DC

## 2016-07-21 NOTE — Telephone Encounter (Signed)
Patient returned Christine Barton's call.  She had questions.  Please call patient back at work [phone] until 3:30  After 3:30,call patient back on her cell phone.

## 2016-07-21 NOTE — Telephone Encounter (Signed)
Rx sent in and per [facility] she didn't receive a PA yesterday while doing the paper refills so I will be on the look out for the PA today

## 2016-07-21 NOTE — Telephone Encounter (Signed)
Received PA for both meds, PA for indomethacin was approved and PA for flexeril said it was pending, pharmacy notified, called pt but no answer and no voicemail set up

## 2016-07-21 NOTE — Telephone Encounter (Signed)
Pt notified of PA status and that Rx for the ointment was sent in already and I will let her know the response of the PA on the flexeril once I get an answer

## 2016-07-21 NOTE — Addendum Note (Signed)
Addended by: Tammi Sou on: 07/21/2016 08:39 AM   Modules accepted: Orders

## 2016-07-22 NOTE — Telephone Encounter (Signed)
Received fax saying both medications were approved, faxed letter to pharmacy so they are aware and also called pt and advise her of approvals too, letter placed in Dr. Marliss Coots inbox to sign and send for scanning

## 2016-08-11 LAB — HM DIABETES EYE EXAM

## 2016-08-26 ENCOUNTER — Ambulatory Visit (INDEPENDENT_AMBULATORY_CARE_PROVIDER_SITE_OTHER): Payer: Medicare PPO | Admitting: Internal Medicine

## 2016-08-26 ENCOUNTER — Encounter: Payer: Self-pay | Admitting: Internal Medicine

## 2016-08-26 VITALS — BP 130/84 | HR 84 | Temp 97.8°F | Wt 134.0 lb

## 2016-08-26 DIAGNOSIS — N3000 Acute cystitis without hematuria: Secondary | ICD-10-CM

## 2016-08-26 DIAGNOSIS — R300 Dysuria: Secondary | ICD-10-CM | POA: Diagnosis not present

## 2016-08-26 LAB — POC URINALSYSI DIPSTICK (AUTOMATED)
Bilirubin, UA: NEGATIVE
Blood, UA: NEGATIVE
Glucose, UA: NEGATIVE
Ketones, UA: NEGATIVE
Nitrite, UA: NEGATIVE
Protein, UA: NEGATIVE
Spec Grav, UA: 1.015 (ref 1.010–1.025)
Urobilinogen, UA: 0.2 E.U./dL
pH, UA: 7 (ref 5.0–8.0)

## 2016-08-26 MED ORDER — NITROFURANTOIN MONOHYD MACRO 100 MG PO CAPS: 100.0000 mg | ORAL_CAPSULE | Freq: Two times a day (BID) | ORAL | 0 refills | Status: DC

## 2016-08-26 NOTE — Addendum Note (Signed)
Addended by: Lurlean Nanny on: 08/26/2016 11:06 AM   Modules accepted: Orders

## 2016-08-26 NOTE — Patient Instructions (Signed)

## 2016-08-26 NOTE — Progress Notes (Signed)
HPI  Pt presents to the clinic today with c/o dysuria. This started 1 week ago. She denies urinary urgency, frequency, or blood in her urine. She denies fever, chills, nausea or low back pain. She denies vaginal complaints. She has tried AZO with some relief.   Review of Systems  Past Medical History:  Diagnosis Date  . Alcohol abuse, unspecified   . Cervical spondylosis 2006   MRI  . Degeneration of cervical intervertebral disc 2006   MRI  . Diverticulosis of colon (without mention of hemorrhage)   . Hyperpotassemia   . Microscopic hematuria   . Mononeuritis of unspecified site   . Nonspecific abnormal results of liver function study   . Other abnormal glucose   . Other and unspecified hyperlipidemia   . Other chronic nonalcoholic liver disease   . Personal history of malignant neoplasm of breast   . Pneumothorax, acute    right, spontaneous  . Tobacco use disorder   . Unspecified vitamin D deficiency     Family History  Problem Relation Age of Onset  . Heart failure Father     Social History   Social History  . Marital status: Single    Spouse name: N/A  . Number of children: 1  . Years of education: N/A   Occupational History  .  [employer]   Social History Main Topics  . Smoking status: Former Smoker    Packs/day: 1.00    Types: Cigarettes    Quit date: 11/02/2014  . Smokeless tobacco: Never Used  . Alcohol use Yes     Comment: Moderate  . Drug use: No  . Sexual activity: Not on file   Other Topics Concern  . Not on file   Social History Narrative   Divorced      1 child      Works at [employer]  . Oxycodone Nausea And Vomiting  . Glipizide Other (See Comments)    Stomach pain  . Metformin And Related Other (See Comments)    Stomach pain     Constitutional: Denies fever, malaise, fatigue, headache or abrupt weight changes.   GU: Pt reports pain with urination. Denies urgency,  frequency, burning sensation, blood in urine, odor or discharge.   No other specific complaints in a complete review of systems (except as listed in HPI above).    Objective:   Physical Exam  BP 130/84   Pulse 84   Temp 97.8 F (36.6 C) (Oral)   Wt 134 lb (60.8 kg)   SpO2 96%   BMI 22.30 kg/m  Wt Readings from Last 3 Encounters:  08/26/16 134 lb (60.8 kg)  07/13/16 133 lb 6.4 oz (60.5 kg)  12/10/14 126 lb (57.2 kg)    General: Appears her stated age, well developed, well nourished in NAD. Abdomen: Soft. Normal bowel sounds. No distention or masses noted.  Tender to palpation over the bladder area. No CVA tenderness.       Assessment & Plan:   Dysuria secondary to UTI:  Urinalysis: 3+ leuks Will send urine culture eRx sent if for Macrobid 100 mg BID x 5 days OK to take AZO OTC Drink plenty of fluids  RTC as needed or if symptoms persist. Webb Silversmith, NP

## 2016-08-28 LAB — URINE CULTURE

## 2016-10-04 ENCOUNTER — Other Ambulatory Visit: Payer: Self-pay | Admitting: Family Medicine

## 2016-10-04 DIAGNOSIS — Z1231 Encounter for screening mammogram for malignant neoplasm of breast: Secondary | ICD-10-CM

## 2016-10-17 ENCOUNTER — Telehealth: Payer: Self-pay | Admitting: Family Medicine

## 2016-10-17 DIAGNOSIS — E559 Vitamin D deficiency, unspecified: Secondary | ICD-10-CM

## 2016-10-17 DIAGNOSIS — E1165 Type 2 diabetes mellitus with hyperglycemia: Principal | ICD-10-CM

## 2016-10-17 DIAGNOSIS — Z0000 Encounter for general adult medical examination without abnormal findings: Secondary | ICD-10-CM

## 2016-10-17 DIAGNOSIS — IMO0001 Reserved for inherently not codable concepts without codable children: Secondary | ICD-10-CM

## 2016-10-17 NOTE — Telephone Encounter (Signed)
-----   Message from Ellamae Sia sent at 10/15/2016  9:30 AM EDT ----- Regarding: Lab orders for Tuesday, 7.10.18 Patient is scheduled for CPX labs, please order future labs, Thanks , Karna Christmas

## 2016-10-19 ENCOUNTER — Other Ambulatory Visit (INDEPENDENT_AMBULATORY_CARE_PROVIDER_SITE_OTHER): Payer: Medicare PPO

## 2016-10-19 DIAGNOSIS — E559 Vitamin D deficiency, unspecified: Secondary | ICD-10-CM | POA: Diagnosis not present

## 2016-10-19 DIAGNOSIS — E1165 Type 2 diabetes mellitus with hyperglycemia: Secondary | ICD-10-CM | POA: Diagnosis not present

## 2016-10-19 DIAGNOSIS — Z0000 Encounter for general adult medical examination without abnormal findings: Secondary | ICD-10-CM | POA: Diagnosis not present

## 2016-10-19 DIAGNOSIS — IMO0001 Reserved for inherently not codable concepts without codable children: Secondary | ICD-10-CM

## 2016-10-19 LAB — COMPREHENSIVE METABOLIC PANEL
ALT: 28 U/L (ref 0–35)
AST: 34 U/L (ref 0–37)
Albumin: 4.1 g/dL (ref 3.5–5.2)
Alkaline Phosphatase: 75 U/L (ref 39–117)
BUN: 12 mg/dL (ref 6–23)
CO2: 25 mEq/L (ref 19–32)
Calcium: 10.2 mg/dL (ref 8.4–10.5)
Chloride: 104 mEq/L (ref 96–112)
Creatinine, Ser: 0.73 mg/dL (ref 0.40–1.20)
GFR: 84.82 mL/min (ref >60.00)
Glucose, Bld: 131 mg/dL — ABNORMAL HIGH (ref 70–99)
Potassium: 4.6 mEq/L (ref 3.5–5.1)
Sodium: 138 mEq/L (ref 135–145)
Total Bilirubin: 0.6 mg/dL (ref 0.2–1.2)
Total Protein: 7.3 g/dL (ref 6.0–8.3)

## 2016-10-19 LAB — LIPID PANEL
Cholesterol: 206 mg/dL — ABNORMAL HIGH (ref 0–200)
HDL: 58.7 mg/dL (ref >39.00)
LDL Cholesterol: 124 mg/dL — ABNORMAL HIGH (ref 0–99)
NonHDL: 147.04
Total CHOL/HDL Ratio: 4
Triglycerides: 115 mg/dL (ref 0.0–149.0)
VLDL: 23 mg/dL (ref 0.0–40.0)

## 2016-10-19 LAB — CBC WITH DIFFERENTIAL/PLATELET
Basophils Absolute: 0 10*3/uL (ref 0.0–0.1)
Basophils Relative: 0.5 % (ref 0.0–3.0)
Eosinophils Absolute: 0.1 10*3/uL (ref 0.0–0.7)
Eosinophils Relative: 1.6 % (ref 0.0–5.0)
HCT: 43.6 % (ref 36.0–46.0)
Hemoglobin: 14.9 g/dL (ref 12.0–15.0)
Lymphocytes Relative: 35.2 % (ref 12.0–46.0)
Lymphs Abs: 2.5 10*3/uL (ref 0.7–4.0)
MCHC: 34 g/dL (ref 30.0–36.0)
MCV: 101.7 fl — ABNORMAL HIGH (ref 78.0–100.0)
Monocytes Absolute: 0.7 10*3/uL (ref 0.1–1.0)
Monocytes Relative: 9.4 % (ref 3.0–12.0)
Neutro Abs: 3.8 10*3/uL (ref 1.4–7.7)
Neutrophils Relative %: 53.3 % (ref 43.0–77.0)
Platelets: 215 10*3/uL (ref 150.0–400.0)
RBC: 4.29 Mil/uL (ref 3.87–5.11)
RDW: 14.6 % (ref 11.5–15.5)
WBC: 7.1 10*3/uL (ref 4.0–10.5)

## 2016-10-19 LAB — MICROALBUMIN / CREATININE URINE RATIO
Creatinine,U: 119.1 mg/dL
Microalb Creat Ratio: 0.6 mg/g (ref 0.0–30.0)
Microalb, Ur: <0.7 mg/dL (ref 0.0–1.9)

## 2016-10-19 LAB — VITAMIN D 25 HYDROXY (VIT D DEFICIENCY, FRACTURES): VITD: 44.73 ng/mL (ref 30.00–100.00)

## 2016-10-19 LAB — HEMOGLOBIN A1C: Hgb A1c MFr Bld: 6.5 % (ref 4.6–6.5)

## 2016-10-19 LAB — TSH: TSH: 3.15 u[IU]/mL (ref 0.35–4.50)

## 2016-10-20 ENCOUNTER — Ambulatory Visit
Admission: RE | Admit: 2016-10-20 | Discharge: 2016-10-20 | Disposition: A | Discharge status: Home / Self Care | Payer: Medicare PPO | Source: Ambulatory Visit | Attending: Family Medicine | Admitting: Family Medicine

## 2016-10-20 DIAGNOSIS — Z1231 Encounter for screening mammogram for malignant neoplasm of breast: Secondary | ICD-10-CM

## 2016-10-20 HISTORY — DX: Malignant neoplasm of unspecified site of unspecified female breast: C50.919

## 2016-10-20 HISTORY — DX: Personal history of irradiation: Z92.3

## 2016-10-20 HISTORY — DX: Personal history of antineoplastic chemotherapy: Z92.21

## 2016-10-21 ENCOUNTER — Encounter: Payer: Self-pay | Admitting: *Deleted

## 2016-10-26 ENCOUNTER — Encounter: Payer: Self-pay | Admitting: Gastroenterology

## 2016-10-26 ENCOUNTER — Ambulatory Visit (INDEPENDENT_AMBULATORY_CARE_PROVIDER_SITE_OTHER): Payer: Medicare PPO | Admitting: Family Medicine

## 2016-10-26 ENCOUNTER — Encounter: Payer: Self-pay | Admitting: Family Medicine

## 2016-10-26 VITALS — BP 132/70 | HR 104 | Temp 98.2°F | Ht 64.5 in | Wt 126.8 lb

## 2016-10-26 DIAGNOSIS — E119 Type 2 diabetes mellitus without complications: Secondary | ICD-10-CM

## 2016-10-26 DIAGNOSIS — Z853 Personal history of malignant neoplasm of breast: Secondary | ICD-10-CM | POA: Diagnosis not present

## 2016-10-26 DIAGNOSIS — Z87898 Personal history of other specified conditions: Secondary | ICD-10-CM | POA: Diagnosis not present

## 2016-10-26 DIAGNOSIS — E559 Vitamin D deficiency, unspecified: Secondary | ICD-10-CM | POA: Diagnosis not present

## 2016-10-26 DIAGNOSIS — Z0001 Encounter for general adult medical examination with abnormal findings: Secondary | ICD-10-CM | POA: Diagnosis not present

## 2016-10-26 DIAGNOSIS — Z23 Encounter for immunization: Secondary | ICD-10-CM

## 2016-10-26 DIAGNOSIS — K760 Fatty (change of) liver, not elsewhere classified: Secondary | ICD-10-CM | POA: Diagnosis not present

## 2016-10-26 DIAGNOSIS — E2839 Other primary ovarian failure: Secondary | ICD-10-CM | POA: Diagnosis not present

## 2016-10-26 DIAGNOSIS — Z1211 Encounter for screening for malignant neoplasm of colon: Secondary | ICD-10-CM | POA: Diagnosis not present

## 2016-10-26 DIAGNOSIS — B370 Candidal stomatitis: Secondary | ICD-10-CM | POA: Diagnosis not present

## 2016-10-26 DIAGNOSIS — E7800 Pure hypercholesterolemia, unspecified: Secondary | ICD-10-CM | POA: Diagnosis not present

## 2016-10-26 DIAGNOSIS — Z0000 Encounter for general adult medical examination without abnormal findings: Secondary | ICD-10-CM | POA: Insufficient documentation

## 2016-10-26 DIAGNOSIS — F1011 Alcohol abuse, in remission: Secondary | ICD-10-CM

## 2016-10-26 DIAGNOSIS — Z87891 Personal history of nicotine dependence: Secondary | ICD-10-CM

## 2016-10-26 MED ORDER — NYSTATIN 100000 UNIT/ML MT SUSP: 5.0000 mL | Freq: Three times a day (TID) | OROMUCOSAL | 0 refills | Status: DC

## 2016-10-26 NOTE — Assessment & Plan Note (Signed)
Vitamin D level is therapeutic with current supplementation Disc importance of this to bone and overall health

## 2016-10-26 NOTE — Assessment & Plan Note (Signed)
Due for screening colonoscopy  Ref done

## 2016-10-26 NOTE — Assessment & Plan Note (Signed)
Disc goals for lipids and reasons to control them Rev labs with pt Rev low sat fat diet in detail LDL in 120s Handout give re : diet

## 2016-10-26 NOTE — Progress Notes (Signed)
Subjective:    Patient ID: Christine Barton, female    DOB: December 31, 1950, 66 y.o.   MRN: [id number]  HPI Here for welcome to medicare visit   Just retired on may 31 Started silver sneakers program at the [employer]- loves it  Really doing well   Wt Readings from Last 3 Encounters:  10/26/16 126 lb 12 oz (57.5 kg)  08/26/16 134 lb (60.8 kg)  07/13/16 133 lb 6.4 oz (60.5 kg)  has been working out at the [employer] enough / has problems with appetite - tries to get on ensure  Has had a lifelong problem with appetite and does not want medication for this    21.42 kg/m  I have personally reviewed the Medicare Annual Wellness questionnaire and have noted 1. The patient's medical and social history 2. Their use of alcohol, tobacco or illicit drugs 3. Their current medications and supplements 4. The patient's functional ability including ADL's, fall risks, home safety risks and hearing or visual             impairment. 5. Diet and physical activities 6. Evidence for depression or mood disorders  The patients weight, height, BMI have been recorded in the chart and visual acuity is per eye clinic.  I have made referrals, counseling and provided education to the patient based review of the above and I have provided the pt with a written personalized care plan for preventive services. Reviewed and updated provider list, see scanned forms.  See scanned forms.  Routine anticipatory guidance given to patient.  See health maintenance. 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 -no problems at all   Last sexual contact was 2014  Had herpes   (? And another vaginitis)  PMH and SH reviewed  Meds, vitals, and allergies reviewed.   ROS: See HPI.  Otherwise negative.    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)   Cholesterol Lab Results  Component Value Date   CHOL 206 (H) 10/19/2016   CHOL 156 10/23/2014   CHOL 238 (H) 09/10/2013   Lab Results  Component Value Date   HDL 58.70 10/19/2016   HDL 52.20  10/23/2014   HDL 37.90 (L) 09/10/2013   Lab Results  Component Value Date   LDLCALC 124 (H) 10/19/2016   LDLCALC 75 10/23/2014   LDLCALC 169 (H) 09/10/2013   Lab Results  Component Value Date   TRIG 115.0 10/19/2016   TRIG 144.0 10/23/2014   TRIG 154.0 (H) 09/10/2013   Lab Results  Component Value Date   CHOLHDL 4 10/19/2016   CHOLHDL 3 10/23/2014   CHOLHDL 6 09/10/2013   Lab Results  Component Value Date   LDLDIRECT 145.1 10/10/2012   LDLDIRECT 151.4 07/21/2011   LDLDIRECT 132.4 07/29/2009   Eating- is eating more fats / eating out   Glucose is 131  Lab Results  Component Value Date   HGBA1C 6.5 10/19/2016  right on the borderline for diabetes  Up from 6.3   Lab Results  Component Value Date   WBC 7.1 10/19/2016   HGB 14.9 10/19/2016   HCT  43.6 10/19/2016   MCV 101.7 (H) 10/19/2016   PLT 215.0 10/19/2016   Lab Results  Component Value Date   MICROALBUR <0.7 10/19/2016   Lab Results  Component Value Date   TSH 3.15 10/19/2016       Review of Systems    Review of Systems  Constitutional: Negative for fever, appetite change, fatigue and unexpected weight change.  Eyes: Negative for pain and visual disturbance.  Respiratory: Negative for cough and shortness of breath.   ENT pos for thrush - ST at times  Cardiovascular: Negative for cp or palpitations    Gastrointestinal: Negative for nausea, diarrhea and constipation.  Genitourinary: Negative for urgency and frequency.  Skin: Negative for pallor or rash  Pos for large mole on R back  MSK pos for worse joint pain/arthritis  Neurological: Negative for weakness, light-headedness, numbness and headaches.  Hematological: Negative for adenopathy. Does not bruise/bleed easily.  Psychiatric/Behavioral: Negative for dysphoric mood. The patient is not nervous/anxious.      Objective:   Physical Exam  Constitutional: She appears well-developed and well-nourished. No distress.  Well appearing   HENT:  Head: Normocephalic and atraumatic.  Right Ear: External ear normal.  Left Ear: External ear normal.  Mouth/Throat: Oropharynx is clear and moist.  Rosacea with changes to nose noted   White/yellow coating on tongue   Eyes: Pupils are equal, round, and reactive to light. Conjunctivae and EOM are normal. Right eye exhibits no discharge. Left eye exhibits no discharge. No scleral icterus.  Neck: Normal range of motion. Neck supple. No JVD present. Carotid bruit is not present. No thyromegaly present.  Cardiovascular: Normal rate, regular rhythm, normal heart sounds and intact distal pulses.  Exam reveals no gallop.   Pulmonary/Chest: Effort normal and breath sounds normal. No respiratory distress. She has no wheezes. She exhibits no tenderness.  bs are slt distant     Abdominal: Soft. Bowel sounds are normal. She exhibits no distension, no abdominal bruit and no mass. There is no tenderness.  Genitourinary: No breast swelling, tenderness, discharge or bleeding.  Genitourinary Comments: Breast exam: No mass, nodules, thickening, tenderness, bulging, retraction, inflamation, nipple discharge or skin changes noted.  No axillary or clavicular LA.    Surgical changes noted on R breast-baseline from prior cancer   Musculoskeletal: Normal range of motion. She exhibits no edema or tenderness.  Lymphadenopathy:    She has no cervical adenopathy.  Neurological: She is alert. She has normal reflexes. No cranial nerve deficit. She exhibits normal muscle tone. Coordination normal.  Skin: Skin is  warm and dry. No rash noted. No erythema. No pallor.  Solar lentigines diffusely Facial rosacea   Psychiatric: She has a normal mood and affect.          Assessment & Plan:   Problem List Items Addressed This Visit      Digestive   Fatty liver    LFTs are nl today  Enc to avoid etoh      Thrush    Pt c/o ST again  Coating on tongue  tx with nystatin oral solun swish and swallow Update if not starting to improve in a week or if worsening        Relevant Medications   nystatin (MYCOSTATIN) 100000 UNIT/ML suspension     Endocrine   Controlled type 2 diabetes mellitus without complication, without long-term current use of insulin (HCC)    Lab Results  Component Value Date   HGBA1C 6.5 10/19/2016   Will re check in 3 mo  disc imp of low glycemic diet and wt loss to prevent DM2  Handouts given  Need to inc protein calories and avoid sugar        Other   BREAST CANCER, HX OF    No reoccurance utd mammogram  Nl exam      Colon cancer screening    Due for screening colonoscopy  Ref done       Relevant Orders   Ambulatory referral to Gastroenterology   Estrogen deficiency   Relevant Orders   DG Bone Density   Former smoker    Commended on  smoking cessation       History of alcohol abuse    Per pt 1-3 drinks daily at most LFTs nl  Disc value in total cessation in light of past dependence and for general health      Hyperlipidemia    Disc goals for lipids and reasons to control them Rev labs with pt Rev low sat fat diet in detail LDL in 120s Handout give re : diet       Vitamin D deficiency - Primary    Vitamin D level is therapeutic with current supplementation Disc importance of this to bone and overall health       Welcome to Medicare preventive visit    Reviewed health habits including diet and exercise and skin cancer prevention Reviewed appropriate screening tests for age  Also reviewed health mt list, fam hx and immunization status , as well as social and family history   See HPI Labs reviewed  Enc etoh cessation  Commended being smoke free  Ref for colonoscopy and dexa  prevnar vaccine  Given packet for her adv directive prep   Eat at least 3 meals per day and snacks with protein Try not to overdue sugar  glucerna supplements can be helpful    We will refer you for a colonoscopy and a bone density test  prevnar vaccine today   Here is a packet (blue)- regarding an advance directive-please work on it and get [place] a copy        Other Visit Diagnoses    Need for vaccination with 13-polyvalent pneumococcal conjugate vaccine       Relevant Orders   Pneumococcal conjugate vaccine 13-valent (Completed)

## 2016-10-26 NOTE — Patient Instructions (Addendum)
Eat at least 3 meals per day and snacks with protein Try not to overdue sugar  glucerna supplements can be helpful    We will refer you for a colonoscopy and a bone density test  prevnar vaccine today   Here is a packet (blue)- regarding an advance directive-please work on it and get [place] a copy    For cholesterol:   Avoid red meat/ fried foods/ egg yolks/ fatty breakfast meats/ butter, cheese and high fat dairy/ and shellfish    I am sending nystatin to your pharmacy for thrush   If you want to return for STD screening in the future-let us know

## 2016-10-26 NOTE — Assessment & Plan Note (Signed)
Reviewed health habits including diet and exercise and skin cancer prevention Reviewed appropriate screening tests for age  Also reviewed health mt list, fam hx and immunization status , as well as social and family history   See HPI Labs reviewed  Enc etoh cessation  Commended being smoke free  Ref for colonoscopy and dexa  prevnar vaccine  Given packet for her adv directive prep   Eat at least 3 meals per day and snacks with protein Try not to overdue sugar  glucerna supplements can be helpful    We will refer you for a colonoscopy and a bone density test  prevnar vaccine today   Here is a packet (blue)- regarding an advance directive-please work on it and get [place] a copy

## 2016-10-26 NOTE — Assessment & Plan Note (Signed)
Pt c/o ST again  Coating on tongue  tx with nystatin oral solun swish and swallow Update if not starting to improve in a week or if worsening

## 2016-10-26 NOTE — Assessment & Plan Note (Signed)
Commended on smoking cessation

## 2016-10-26 NOTE — Assessment & Plan Note (Signed)
LFTs are nl today  Enc to avoid etoh

## 2016-10-26 NOTE — Assessment & Plan Note (Signed)
Per pt 1-3 drinks daily at most LFTs nl  Disc value in total cessation in light of past dependence and for general health

## 2016-10-26 NOTE — Assessment & Plan Note (Signed)
Lab Results  Component Value Date   HGBA1C 6.5 10/19/2016   Will re check in 3 mo  disc imp of low glycemic diet and wt loss to prevent DM2  Handouts given  Need to inc protein calories and avoid sugar

## 2016-10-26 NOTE — Assessment & Plan Note (Signed)
No reoccurance utd mammogram  Nl exam

## 2016-11-03 ENCOUNTER — Ambulatory Visit
Admission: RE | Admit: 2016-11-03 | Discharge: 2016-11-03 | Disposition: A | Discharge status: Home / Self Care | Payer: Medicare PPO | Source: Ambulatory Visit | Attending: Family Medicine | Admitting: Family Medicine

## 2016-11-03 DIAGNOSIS — E2839 Other primary ovarian failure: Secondary | ICD-10-CM

## 2016-11-03 LAB — HM DEXA SCAN: HM Dexa Scan: NORMAL

## 2016-11-08 ENCOUNTER — Telehealth: Payer: Self-pay | Admitting: Family Medicine

## 2016-11-08 NOTE — Telephone Encounter (Signed)
Addressed through result notes  

## 2016-11-08 NOTE — Telephone Encounter (Signed)
Pt called to get results from recent dexa bone density test. She is requesting a cb.

## 2016-11-11 ENCOUNTER — Telehealth: Payer: Self-pay | Admitting: Family Medicine

## 2016-11-11 DIAGNOSIS — Z113 Encounter for screening for infections with a predominantly sexual mode of transmission: Secondary | ICD-10-CM | POA: Insufficient documentation

## 2016-11-11 NOTE — Telephone Encounter (Signed)
I ordered std screening tests

## 2016-11-11 NOTE — Telephone Encounter (Signed)
Spoke with pt and addressed the DEXA issues.   Pt did want me to ask Dr. Glori Bickers if she could come in for labs to get all STD testing. Pt said her and Dr. Glori Bickers discussed this at her last CPE so she doesn't think she needs to f/u with Dr. Glori Bickers she just wants a lab appt to have it done, if it's okay with Dr. Glori Bickers please put in lab orders and I will call pt and get a lab appt scheduled.

## 2016-11-11 NOTE — Telephone Encounter (Signed)
appt scheduled

## 2016-11-11 NOTE — Telephone Encounter (Signed)
Pt called stating she had missed a call.

## 2016-11-12 ENCOUNTER — Other Ambulatory Visit: Payer: Self-pay

## 2016-11-26 ENCOUNTER — Telehealth: Payer: Self-pay | Admitting: *Deleted

## 2016-11-26 ENCOUNTER — Ambulatory Visit (AMBULATORY_SURGERY_CENTER): Payer: Self-pay | Admitting: *Deleted

## 2016-11-26 VITALS — Ht 65.0 in | Wt 127.0 lb

## 2016-11-26 DIAGNOSIS — Z1211 Encounter for screening for malignant neoplasm of colon: Secondary | ICD-10-CM

## 2016-11-26 MED ORDER — NA SULFATE-K SULFATE-MG SULF 17.5-3.13-1.6 GM/177ML PO SOLN: ORAL | 0 refills | Status: DC

## 2016-11-26 NOTE — Telephone Encounter (Signed)
Pt unable to afford Suprep.  Moviprep can be given- new instructions printed and pt will pick up prep and instructions today

## 2016-11-26 NOTE — Progress Notes (Signed)
No egg or soy allergy  No hx of intubation or anesthesia problems per pt  No home oxygen used or hx of sleep apnea  No diet medications taken

## 2016-11-30 ENCOUNTER — Ambulatory Visit (AMBULATORY_SURGERY_CENTER): Payer: Medicare PPO | Admitting: Gastroenterology

## 2016-11-30 ENCOUNTER — Encounter: Payer: Self-pay | Admitting: Gastroenterology

## 2016-11-30 VITALS — BP 164/84 | HR 83 | Temp 97.3°F | Resp 12 | Ht 65.0 in | Wt 127.0 lb

## 2016-11-30 DIAGNOSIS — D122 Benign neoplasm of ascending colon: Secondary | ICD-10-CM | POA: Diagnosis not present

## 2016-11-30 DIAGNOSIS — Z1212 Encounter for screening for malignant neoplasm of rectum: Secondary | ICD-10-CM | POA: Diagnosis not present

## 2016-11-30 DIAGNOSIS — Z1211 Encounter for screening for malignant neoplasm of colon: Secondary | ICD-10-CM | POA: Diagnosis not present

## 2016-11-30 DIAGNOSIS — K621 Rectal polyp: Secondary | ICD-10-CM

## 2016-11-30 DIAGNOSIS — D128 Benign neoplasm of rectum: Secondary | ICD-10-CM

## 2016-11-30 DIAGNOSIS — D129 Benign neoplasm of anus and anal canal: Secondary | ICD-10-CM

## 2016-11-30 MED ORDER — SODIUM CHLORIDE 0.9 % IV SOLN: 500.0000 mL | INTRAVENOUS | Status: DC

## 2016-11-30 NOTE — Progress Notes (Signed)
Spontaneous respirations throughout. VSS. Resting comfortably. To PACU on room air. Report to  RN. 

## 2016-11-30 NOTE — Progress Notes (Signed)
Called to room to assist during endoscopic procedure.  Patient ID and intended procedure confirmed with present staff. Received instructions for my participation in the procedure from the performing physician.  

## 2016-11-30 NOTE — Op Note (Signed)
[facility] Patient Name: Christine Barton Procedure Date: 11/30/2016 8:51 AM MRN: [id number] Endoscopist: Mauri Pole , MD Age: 66 Referring MD:  Date of Birth: 04/05/1951 Gender: Female Account #: [id number] Procedure:                Colonoscopy Indications:              Screening for colorectal malignant neoplasm, Last                            colonoscopy: 2007 Medicines:                Monitored Anesthesia Care Procedure:                Pre-Anesthesia Assessment:                           - Prior to the procedure, a History and Physical                            was performed, and patient medications and                            allergies were reviewed. The patient's tolerance of                            previous anesthesia was also reviewed. The risks                            and benefits of the procedure and the sedation                            options and risks were discussed with the patient.                            All questions were answered, and informed consent                            was obtained. Prior Anticoagulants: The patient has                            taken no previous anticoagulant or antiplatelet                            agents. ASA Grade Assessment: II - A patient with                            mild systemic disease. After reviewing the risks                            and benefits, the patient was deemed in                            satisfactory condition to undergo the procedure.  After obtaining informed consent, the colonoscope                            was passed under direct vision. Throughout the                            procedure, the patient's blood pressure, pulse, and                            oxygen saturations were monitored continuously. The                            Model PCF-[id number]) scope was introduced                            through the anus and advanced to the  the cecum,                            identified by appendiceal orifice and ileocecal                            valve. The colonoscopy was performed without                            difficulty. The patient tolerated the procedure                            well. The quality of the bowel preparation was                            excellent. The ileocecal valve, appendiceal                            orifice, and rectum were photographed. Scope In: 8:53:32 AM Scope Out: 9:13:53 AM Scope Withdrawal Time: 0 hours 15 minutes 50 seconds  Total Procedure Duration: 0 hours 20 minutes 21 seconds  Findings:                 The perianal and digital rectal examinations were                            normal.                           A 6 mm polyp was found in the ascending colon. The                            polyp was sessile. The polyp was removed with a                            cold snare. Resection and retrieval were complete.                           Four sessile polyps were found in the rectum and  ascending colon. The polyps were 1 to 2 mm in size.                            These polyps were removed with a cold biopsy                            forceps. Resection and retrieval were complete.                           Multiple small and large-mouthed diverticula were                            found in the sigmoid colon, descending colon,                            transverse colon and ascending colon. There was                            narrowing of the colon in association with the                            diverticular opening. There was evidence of                            diverticular spasm.                           Non-bleeding internal hemorrhoids were found during                            retroflexion. The hemorrhoids were small.                           The exam was otherwise without abnormality. Complications:            No immediate  complications. Estimated Blood Loss:     Estimated blood loss was minimal. Impression:               - One 6 mm polyp in the ascending colon, removed                            with a cold snare. Resected and retrieved.                           - Four 1 to 2 mm polyps in the rectum and in the                            ascending colon, removed with a cold biopsy                            forceps. Resected and retrieved.                           - Moderate diverticulosis in the sigmoid colon, in  the descending colon, in the transverse colon and                            in the ascending colon. There was narrowing of the                            colon in association with the diverticular opening.                            There was evidence of diverticular spasm.                           - Non-bleeding internal hemorrhoids.                           - The examination was otherwise normal. Recommendation:           - Patient has a contact number available for                            emergencies. The signs and symptoms of potential                            delayed complications were discussed with the                            patient. Return to normal activities tomorrow.                            Written discharge instructions were provided to the                            patient.                           - Resume previous diet.                           - Continue present medications.                           - Await pathology results.                           - Repeat colonoscopy in 3 - 5 years for                            surveillance based on pathology results. Mauri Pole, MD 11/30/2016 9:18:13 AM This report has been signed electronically.

## 2016-11-30 NOTE — Patient Instructions (Signed)
Impression/Recommendations:  Polyp handout given to patient. Diverticulosis handout given to patient. Hemorrhoid handout given to patient.  Resume previous diet. Continue present medications.  Repeat colonoscopy in 3-5 years based on pathology results.  YOU HAD AN ENDOSCOPIC PROCEDURE TODAY AT [facility]:   Refer to the procedure report that was given to you for any specific questions about what was found during the examination.  If the procedure report does not answer your questions, please call your gastroenterologist to clarify.  If you requested that your care partner not be given the details of your procedure findings, then the procedure report has been included in a sealed envelope for you to review at your convenience later.  YOU SHOULD EXPECT: Some feelings of bloating in the abdomen. Passage of more gas than usual.  Walking can help get rid of the air that was put into your GI tract during the procedure and reduce the bloating. If you had a lower endoscopy (such as a colonoscopy or flexible sigmoidoscopy) you may notice spotting of blood in your stool or on the toilet paper. If you underwent a bowel prep for your procedure, you may not have a normal bowel movement for a few days.  Please Note:  You might notice some irritation and congestion in your nose or some drainage.  This is from the oxygen used during your procedure.  There is no need for concern and it should clear up in a day or so.  SYMPTOMS TO REPORT IMMEDIATELY:   Following lower endoscopy (colonoscopy or flexible sigmoidoscopy):  Excessive amounts of blood in the stool  Significant tenderness or worsening of abdominal pains  Swelling of the abdomen that is new, acute  Fever of 100F or higher For urgent or emergent issues, a gastroenterologist can be reached at any hour by calling [phone].   DIET:  We do recommend a small meal at first, but then you may proceed to your regular diet.  Drink  plenty of fluids but you should avoid alcoholic beverages for 24 hours.  ACTIVITY:  You should plan to take it easy for the rest of today and you should NOT DRIVE or use heavy machinery until tomorrow (because of the sedation medicines used during the test).    FOLLOW UP: Our staff will call the number listed on your records the next business day following your procedure to check on you and address any questions or concerns that you may have regarding the information given to you following your procedure. If we do not reach you, we will leave a message.  However, if you are feeling well and you are not experiencing any problems, there is no need to return our call.  We will assume that you have returned to your regular daily activities without incident.  If any biopsies were taken you will be contacted by phone or by letter within the next 1-3 weeks.  Please call us at [phone] if you have not heard about the biopsies in 3 weeks.    SIGNATURES/CONFIDENTIALITY: You and/or your care partner have signed paperwork which will be entered into your electronic medical record.  These signatures attest to the fact that that the information above on your After Visit Summary has been reviewed and is understood.  Full responsibility of the confidentiality of this discharge information lies with you and/or your care-partner.

## 2016-12-01 ENCOUNTER — Telehealth: Payer: Self-pay | Admitting: *Deleted

## 2016-12-01 NOTE — Telephone Encounter (Signed)
  Follow up Call-  Call back number 11/30/2016  Post procedure Call Back phone  # [phone] answering machine   Permission to leave phone message Yes  Some recent data might be hidden     Patient questions:  Do you have a fever, pain , or abdominal swelling? No. Pain Score  0 *  Have you tolerated food without any problems? Yes.    Have you been able to return to your normal activities? Yes.    Do you have any questions about your discharge instructions: Diet   No. Medications  No. Follow up visit  No.  Do you have questions or concerns about your Care? No.  Actions: * If pain score is 4 or above: No action needed, pain <4.

## 2016-12-04 ENCOUNTER — Encounter: Payer: Self-pay | Admitting: Gastroenterology

## 2016-12-10 ENCOUNTER — Encounter: Payer: Self-pay | Admitting: Gastroenterology

## 2017-02-03 ENCOUNTER — Ambulatory Visit: Payer: Self-pay

## 2017-02-10 HISTORY — PX: EYE SURGERY: SHX253

## 2017-02-16 ENCOUNTER — Ambulatory Visit: Payer: Medicare PPO | Admitting: Family Medicine

## 2017-02-16 ENCOUNTER — Encounter: Payer: Self-pay | Admitting: Family Medicine

## 2017-02-16 ENCOUNTER — Ambulatory Visit (INDEPENDENT_AMBULATORY_CARE_PROVIDER_SITE_OTHER)
Admission: RE | Admit: 2017-02-16 | Discharge: 2017-02-16 | Disposition: A | Discharge status: Home / Self Care | Payer: Medicare PPO | Source: Ambulatory Visit | Attending: Family Medicine | Admitting: Family Medicine

## 2017-02-16 VITALS — BP 124/78 | HR 115 | Temp 98.2°F | Ht 64.5 in | Wt 130.5 lb

## 2017-02-16 DIAGNOSIS — Z23 Encounter for immunization: Secondary | ICD-10-CM | POA: Diagnosis not present

## 2017-02-16 DIAGNOSIS — M25562 Pain in left knee: Secondary | ICD-10-CM

## 2017-02-16 MED ORDER — INDOMETHACIN 50 MG PO CAPS: ORAL_CAPSULE | ORAL | 2 refills | Status: DC

## 2017-02-16 MED ORDER — CYCLOBENZAPRINE HCL 10 MG PO TABS: ORAL_TABLET | ORAL | 2 refills | Status: DC

## 2017-02-16 NOTE — Progress Notes (Signed)
Dr. Frederico Hamman T. Kenyette Gundy, MD, [facility] [place], [address] Phone: [phone] Fax: [phone]  02/16/2017  Patient: Christine Barton, MRN: [id number], DOB: 02-19-1951, 66 y.o.  Primary Physician:  Tower, [name], MD   Chief Complaint  Patient presents with  . Joint Swelling    Left   Subjective:   Christine Barton is a 66 y.o. very pleasant female patient who presents with the following:  Couple of weeks ago, iced knee =, heating pad and took some inflammation pills. Year ago, fell at work. She hasn't really had any kind of inciting event that she can recall. She has had some warmth, and she also has had some swelling of the joint. No injury that she can recall.  She has taken a couple doses of indomethacin, and this is helped.  Gout? indocin  Past Medical History, Surgical History, Social History, Family History, Problem List, Medications, and Allergies have been reviewed and updated if relevant.  Patient Active Problem List   Diagnosis Date Noted  . Screening examination for STD (sexually transmitted disease) 11/11/2016  . Welcome to Medicare preventive visit 10/26/2016  . Estrogen deficiency 10/26/2016  . Colon cancer screening 11/01/2014  . Elevated liver enzymes 11/01/2014  . Thrush 09/13/2013  . Encounter for routine gynecological examination 09/12/2013  . Rapid heart beat 10/10/2012  . Heartburn 08/20/2011  . Routine general medical examination at a health care facility 07/21/2011  . Vitamin D deficiency 08/04/2009  . POSTMENOPAUSAL STATUS 08/04/2009  . Controlled type 2 diabetes mellitus without complication, without long-term current use of insulin ([place]) 02/18/2009  . Hyperlipidemia 06/14/2008  . [place] DISEASE, CERVICAL 03/30/2007  . History of alcohol abuse 12/06/2006  . Former smoker 12/06/2006  . NEUROPATHY 12/06/2006  . DIVERTICULOSIS, COLON 12/06/2006  . Fatty liver 12/06/2006  . BREAST  CANCER, HX OF 12/06/2006    Past Medical History:  Diagnosis Date  . Alcohol abuse, unspecified   . Breast cancer ([place]) 1998   Right  . Cataract    left eye  . Cervical spondylosis 2006   MRI  . Degeneration of cervical intervertebral disc 2006   MRI  . Diverticulosis of colon (without mention of hemorrhage)   . Hyperpotassemia   . Microscopic hematuria   . Mononeuritis of unspecified site   . Nonspecific abnormal results of liver function study   . Other abnormal glucose   . Other and unspecified hyperlipidemia    no per pt  . Other chronic nonalcoholic liver disease   . Personal history of chemotherapy   . Personal history of malignant neoplasm of breast   . Personal history of radiation therapy   . Pneumothorax, acute    right, spontaneous  . Tobacco use disorder   . Unspecified vitamin D deficiency     Past Surgical History:  Procedure Laterality Date  . BREAST BIOPSY  9/03   Right  . BREAST LUMPECTOMY    . BREAST LUMPECTOMY  1998  . CHEST TUBE INSERTION  11/02/2014  . COLONOSCOPY    . TUBAL LIGATION      Social History   Socioeconomic History  . Marital status: Single    Spouse name: Not on file  . Number of children: 1  . Years of education: Not on file  . Highest education level: Not on file  Social Needs  . Financial resource strain: Not on file  . Food insecurity - worry: Not on file  .  Food insecurity - inability: Not on file  . Transportation needs - medical: Not on file  . Transportation needs - non-medical: Not on file  Occupational History    Employer: [employer]  Tobacco Use  . Smoking status: Current Every Day Smoker    Packs/day: 1.00    Types: Cigarettes    Last attempt to quit: 11/02/2014    Years since quitting: 2.2  . Smokeless tobacco: Never Used  Substance and Sexual Activity  . Alcohol use: Yes    Comment: occasional use  . Drug use: No  . Sexual activity: Not on file  Other Topics Concern  . Not on file  Social  History Narrative   Divorced      1 child      Works at [employer] History  Problem Relation Age of Onset  . Heart failure Father   . Colon cancer Maternal Uncle   . Esophageal cancer Neg Hx   . Rectal cancer Neg Hx   . Stomach cancer Neg Hx     Allergies  Allergen Reactions  . Oxycodone Nausea And Vomiting  . Glipizide Other (See Comments)    Stomach pain  . Metformin And Related Other (See Comments)    Stomach pain    Medication list reviewed and updated in full in [facility].  GEN: No fevers, chills. Nontoxic. Primarily MSK c/o today. MSK: Detailed in the HPI GI: tolerating PO intake without difficulty Neuro: No numbness, parasthesias, or tingling associated. Otherwise the pertinent positives of the ROS are noted above.   Objective:   BP 124/78   Pulse (!) 115   Temp 98.2 F (36.8 C) (Oral)   Ht 5' 4.5" (1.638 m)   Wt 130 lb 8 oz (59.2 kg)   BMI 22.05 kg/m    GEN: WDWN, NAD, Non-toxic, Alert & Oriented x 3 HEENT: Atraumatic, Normocephalic.  Ears and Nose: No external deformity. EXTR: No clubbing/cyanosis/edema NEURO: Normal gait.  PSYCH: Normally interactive. Conversant. Not depressed or anxious appearing.  Calm demeanor.   Knee:  L Gait: Normal heel toe pattern ROM: 0-125 Effusion: mild Echymosis or edema: none Patellar tendon NT Painful PLICA: neg Patellar grind: negative Medial and lateral patellar facet loading: negative medial and lateral joint lines:NT Mcmurray's neg Flexion-pinch neg Varus and valgus stress: stable Lachman: neg Ant and Post drawer: neg Hip abduction, IR, ER: WNL Hip flexion str: 5/5 Hip abd: 5/5 Quad: 5/5 VMO atrophy:No Hamstring concentric and eccentric: 5/5   Radiology: No results found.  Assessment and Plan:   Acute pain of left knee - Plan: DG Knee 4 Views W/Patella Left  Need for prophylactic vaccination and inoculation against influenza - Plan: Flu Vaccine QUAD 36+ mos  IM  Knee films have relatively well preserved joint space for age.  Given onset with warmth and no injury, gout versus CPPD would be around the differential. Doing a course of Indocin would be most reasonable.  The patient just had eye surgery, so I will avoid any steroids.  Follow-up: No Follow-up on file.  Meds ordered this encounter  Medications  . indomethacin (INDOCIN) 50 MG capsule    Sig: TAKE ONE CAPSULE BY MOUTH THREE TIMES DAILY AS NEEDED FOR PAIN WITH FOOD    Dispense:  90 capsule    Refill:  2  . cyclobenzaprine (FLEXERIL) 10 MG tablet    Sig: TAKE ONE TABLET BY MOUTH THREE TIMES DAILY AS NEEDED FOR  MUSCLE SPASMS    Dispense:  90 tablet    Refill:  2   Medications Discontinued During This Encounter  Medication Reason  . nystatin (MYCOSTATIN) 100000 UNIT/ML suspension Completed Course  . indomethacin (INDOCIN) 50 MG capsule Reorder  . cyclobenzaprine (FLEXERIL) 10 MG tablet Reorder   Orders Placed This Encounter  Procedures  . DG Knee 4 Views W/Patella Left  . Flu Vaccine QUAD 36+ mos IM    Signed,  Javonn Gauger T. Issaiah Seabrooks, MD   Allergies as of 02/16/2017      Reactions   Oxycodone Nausea And Vomiting   Glipizide Other (See Comments)   Stomach pain   Metformin And Related Other (See Comments)   Stomach pain      Medication List        Accurate as of 02/16/17  2:06 PM. Always use your most recent med list.          cyclobenzaprine 10 MG tablet Commonly known as:  FLEXERIL TAKE ONE TABLET BY MOUTH THREE TIMES DAILY AS NEEDED FOR MUSCLE SPASMS   indomethacin 50 MG capsule Commonly known as:  INDOCIN TAKE ONE CAPSULE BY MOUTH THREE TIMES DAILY AS NEEDED FOR PAIN WITH FOOD   multivitamin with minerals Tabs tablet Take 1 tablet by mouth daily.   mupirocin ointment 2 % Commonly known as:  BACTROBAN APPLY A SMALL AMOUNT TO AFFECTED AREA ONINSIDE OF NOSE 2 TIMES DAILY AS DIRECTED

## 2017-02-22 ENCOUNTER — Ambulatory Visit: Payer: Self-pay

## 2017-02-22 MED ORDER — COLCHICINE 0.6 MG PO TABS: 0.6000 mg | ORAL_TABLET | Freq: Every day | ORAL | 5 refills | Status: DC

## 2017-02-22 NOTE — Telephone Encounter (Signed)
Ms. Starke notified as instructed by telephone.  Colchicine 0.19m prescription sent into [facility] instructed by Dr. CLorelei Pont

## 2017-02-22 NOTE — Telephone Encounter (Signed)
Saw Dr. Lorelei Pont last week for left pain and she is no better. Same amount of swelling. Knee is warm to touch. Ambulating without difficulty. Pain is better and she is continuing with her medication.

## 2017-02-22 NOTE — Addendum Note (Signed)
Addended by: Carter Kitten on: 02/22/2017 10:41 AM   Modules accepted: Orders

## 2017-02-22 NOTE — Telephone Encounter (Signed)
This is a good outcome.  I think we should add colchicine for additional way of treating - overall picture still sounds suggestive of gout.   Colchicine 0.6 mg tabs, 1 po bid, #60, 5 ref  Stop a few days after knee feels better. Keep taking Indocin.  If upset stomach from colchicine, drop down to only 1 tablet a day.

## 2017-02-24 NOTE — Telephone Encounter (Signed)
Pt came into office and states colchicine is $205 for #30 and pt is wanting a Rx that is more economic. pls advise

## 2017-02-24 NOTE — Telephone Encounter (Signed)
I talked to the patient myself.  She recently had eye surgery.  She can't in my opinion get any steroids or steroid injection yet without approval from her eye surgeon.  She feels okay right now is not in that much pain just some mild swelling.  I would just rest it, which is what I told her to continue with anti-inflammatories.  She is also going to ask him at what point she could get a knee injection if things worsen.

## 2017-02-24 NOTE — Telephone Encounter (Signed)
I checked myself with pharmD - this is accurate. Options are limited in this case.

## 2017-10-03 ENCOUNTER — Telehealth: Payer: Self-pay

## 2017-10-03 NOTE — Telephone Encounter (Signed)
Copied from [place] [id number]. Topic: General - Other >> Oct 03, 2017  1:29 PM Yvette Rack wrote: Reason for CRM: Doly with [facility] in [place] requests a call back to discuss Rx for indomethacin (INDOCIN) 50 MG capsule. Cb# [phone]

## 2017-10-03 NOTE — Telephone Encounter (Signed)
Dolly at [facility] notified okay to fill Indomethacin per Dr. Lorelei Pont.

## 2017-10-03 NOTE — Telephone Encounter (Signed)
I spoke with [place] at [facility]; Richmond Campbell said pt has requested a refill that was transferred from [place] for indomethacin(pt does not need a refill there is already an indomethacin refill available). A warning came up in computer after age 67 can create GI issues or bleeding. Richmond Campbell wants to know if OK to refill Indomethacin. Last refilled # 90 x 2 on 02/16/2017. Dolly request cb.

## 2017-10-03 NOTE — Telephone Encounter (Signed)
Yes, ok to fill.  Well known risk for Indocin and NSAIDS.

## 2017-10-27 ENCOUNTER — Other Ambulatory Visit: Payer: Self-pay | Admitting: Family Medicine

## 2017-10-28 ENCOUNTER — Ambulatory Visit (INDEPENDENT_AMBULATORY_CARE_PROVIDER_SITE_OTHER): Payer: Medicare PPO | Admitting: Family Medicine

## 2017-10-28 ENCOUNTER — Encounter: Payer: Self-pay | Admitting: Family Medicine

## 2017-10-28 DIAGNOSIS — L03116 Cellulitis of left lower limb: Secondary | ICD-10-CM | POA: Diagnosis not present

## 2017-10-28 DIAGNOSIS — R634 Abnormal weight loss: Secondary | ICD-10-CM

## 2017-10-28 MED ORDER — CEPHALEXIN 500 MG PO CAPS: 500.0000 mg | ORAL_CAPSULE | Freq: Three times a day (TID) | ORAL | 0 refills | Status: DC

## 2017-10-28 NOTE — Addendum Note (Signed)
Addended by: Elmon Kirschner A on: 10/28/2017 12:06 PM   Modules accepted: Orders

## 2017-10-28 NOTE — Patient Instructions (Signed)

## 2017-10-28 NOTE — Progress Notes (Signed)
Subjective:    Patient ID: Christine Barton, female    DOB: 14-Jul-1950, 67 y.o.   MRN: [id number]  HPI This is a 67 yo female who has noticed a bug bite on her left ankle. She awoke yesterday with pain and swelling of left ankle. Had two areas that she is concerned are spider bites. Elevated leg, applied otc hydrocortisone for bug bites and swelling decreased some today. Denies fever/chills/myalgias. She did not see an insect or feel an acute pain.   Some weight loss, she reports little appetite. Has upcoming appointment for labs/CPE with PCP>   Past Medical History:  Diagnosis Date  . Alcohol abuse, unspecified   . Breast cancer ([place]) 1998   Right  . Cataract    left eye  . Cervical spondylosis 2006   MRI  . Degeneration of cervical intervertebral disc 2006   MRI  . Diverticulosis of colon (without mention of hemorrhage)   . Hyperpotassemia   . Microscopic hematuria   . Mononeuritis of unspecified site   . Nonspecific abnormal results of liver function study   . Other abnormal glucose   . Other and unspecified hyperlipidemia    no per pt  . Other chronic nonalcoholic liver disease   . Personal history of chemotherapy   . Personal history of malignant neoplasm of breast   . Personal history of radiation therapy   . Pneumothorax, acute    right, spontaneous  . Tobacco use disorder   . Unspecified vitamin D deficiency    Past Surgical History:  Procedure Laterality Date  . BREAST BIOPSY  9/03   Right  . BREAST LUMPECTOMY    . BREAST LUMPECTOMY  1998  . CHEST TUBE INSERTION  11/02/2014  . COLONOSCOPY    . TUBAL LIGATION     Family History  Problem Relation Age of Onset  . Heart failure Father   . Colon cancer Maternal Uncle   . Esophageal cancer Neg Hx   . Rectal cancer Neg Hx   . Stomach cancer Neg Hx    Social History   Tobacco Use  . Smoking status: Current Every Day Smoker    Packs/day: 1.00    Types: Cigarettes    Last attempt to quit: 11/02/2014    Years  since quitting: 2.9  . Smokeless tobacco: Never Used  Substance Use Topics  . Alcohol use: Yes    Comment: occasional use  . Drug use: No      Review of Systems Per HPI    Objective:   Physical Exam  Constitutional: She appears well-developed and well-nourished. No distress.  Appears older than stated age. Ruddy, flushed complexion.   HENT:  Head: Normocephalic and atraumatic.  Pulmonary/Chest: Effort normal.  Skin: Skin is warm and dry. She is not diaphoretic.  Left ankle with medial area of erythema and swelling, not warm, non tender, central scabbing, clear drainage. Wound culture obtained. Clinical image to chart.  Left ankle with second, smaller area milder erythema, central scabbing, little swelling, no drainage. Clinical image to chart.   Psychiatric: She has a normal mood and affect. Her behavior is normal. Judgment and thought content normal.  Vitals reviewed.         BP 128/78 (BP Location: Left Arm, Patient Position: Sitting, Cuff Size: Normal)   Pulse (!) 114   Temp 98.1 F (36.7 C) (Oral)   Ht 5' 4.5" (1.638 m)   Wt 120 lb (54.4 kg)   SpO2 96%   BMI  20.28 kg/m  Wt Readings from Last 3 Encounters:  10/28/17 120 lb (54.4 kg)  02/16/17 130 lb 8 oz (59.2 kg)  11/30/16 127 lb (57.6 kg)       Assessment & Plan:  1. Cellulitis of left lower extremity - Provided written and verbal information regarding diagnosis and treatment. - RTC precautions reviewed, she will be coming in on Monday for labs, she was instructed to follow up if worsening - elevated as much as possible - keep clean with mild soap and water - cephALEXin (KEFLEX) 500 MG capsule; Take 1 capsule (500 mg total) by mouth 3 (three) times daily.  Dispense: 21 capsule; Refill: 0  2. Weight loss - follow up with PCP as scheduled   Clarene Reamer, FNP-BC  [facility], [place]  10/28/2017 10:36 AM

## 2017-10-30 ENCOUNTER — Telehealth: Payer: Self-pay | Admitting: Family Medicine

## 2017-10-30 DIAGNOSIS — K760 Fatty (change of) liver, not elsewhere classified: Secondary | ICD-10-CM

## 2017-10-30 DIAGNOSIS — E7800 Pure hypercholesterolemia, unspecified: Secondary | ICD-10-CM

## 2017-10-30 DIAGNOSIS — E559 Vitamin D deficiency, unspecified: Secondary | ICD-10-CM

## 2017-10-30 DIAGNOSIS — Z0000 Encounter for general adult medical examination without abnormal findings: Secondary | ICD-10-CM

## 2017-10-30 DIAGNOSIS — E119 Type 2 diabetes mellitus without complications: Secondary | ICD-10-CM

## 2017-10-30 NOTE — Telephone Encounter (Signed)
-----   Message from Eustace Pen, LPN sent at 04/25/6429  3:11 PM EDT ----- Regarding: Labs 7/22 Lab orders needed. Thank you.  Insurance:  [employer]

## 2017-10-31 ENCOUNTER — Ambulatory Visit: Payer: Medicare PPO | Admitting: Family Medicine

## 2017-10-31 ENCOUNTER — Ambulatory Visit: Payer: Medicare PPO

## 2017-10-31 ENCOUNTER — Other Ambulatory Visit: Payer: Self-pay

## 2017-10-31 ENCOUNTER — Ambulatory Visit (INDEPENDENT_AMBULATORY_CARE_PROVIDER_SITE_OTHER): Payer: Medicare PPO

## 2017-10-31 VITALS — BP 112/70 | HR 117 | Temp 98.3°F | Ht 65.0 in | Wt 120.0 lb

## 2017-10-31 DIAGNOSIS — K760 Fatty (change of) liver, not elsewhere classified: Secondary | ICD-10-CM | POA: Diagnosis not present

## 2017-10-31 DIAGNOSIS — Z0000 Encounter for general adult medical examination without abnormal findings: Secondary | ICD-10-CM | POA: Diagnosis not present

## 2017-10-31 DIAGNOSIS — E559 Vitamin D deficiency, unspecified: Secondary | ICD-10-CM

## 2017-10-31 DIAGNOSIS — E7800 Pure hypercholesterolemia, unspecified: Secondary | ICD-10-CM

## 2017-10-31 DIAGNOSIS — E119 Type 2 diabetes mellitus without complications: Secondary | ICD-10-CM | POA: Diagnosis not present

## 2017-10-31 DIAGNOSIS — Z113 Encounter for screening for infections with a predominantly sexual mode of transmission: Secondary | ICD-10-CM

## 2017-10-31 LAB — VITAMIN D 25 HYDROXY (VIT D DEFICIENCY, FRACTURES): VITD: 60.9 ng/mL (ref 30.00–100.00)

## 2017-10-31 LAB — LIPID PANEL
Cholesterol: 133 mg/dL (ref 0–200)
HDL: 25.3 mg/dL — ABNORMAL LOW (ref >39.00)
NonHDL: 107.64
Total CHOL/HDL Ratio: 5
Triglycerides: 261 mg/dL — ABNORMAL HIGH (ref 0.0–149.0)
VLDL: 52.2 mg/dL — ABNORMAL HIGH (ref 0.0–40.0)

## 2017-10-31 LAB — COMPREHENSIVE METABOLIC PANEL
ALT: 20 U/L (ref 0–35)
AST: 89 U/L — ABNORMAL HIGH (ref 0–37)
Albumin: 3.5 g/dL (ref 3.5–5.2)
Alkaline Phosphatase: 204 U/L — ABNORMAL HIGH (ref 39–117)
BUN: 4 mg/dL — ABNORMAL LOW (ref 6–23)
CO2: 30 mEq/L (ref 19–32)
Calcium: 9.4 mg/dL (ref 8.4–10.5)
Chloride: 99 mEq/L (ref 96–112)
Creatinine, Ser: 0.62 mg/dL (ref 0.40–1.20)
GFR: 102.08 mL/min (ref >60.00)
Glucose, Bld: 157 mg/dL — ABNORMAL HIGH (ref 70–99)
Potassium: 4.3 mEq/L (ref 3.5–5.1)
Sodium: 138 mEq/L (ref 135–145)
Total Bilirubin: 1 mg/dL (ref 0.2–1.2)
Total Protein: 7.7 g/dL (ref 6.0–8.3)

## 2017-10-31 LAB — CBC WITH DIFFERENTIAL/PLATELET
Basophils Absolute: 0 10*3/uL (ref 0.0–0.1)
Basophils Relative: 0.4 % (ref 0.0–3.0)
Eosinophils Absolute: 0.1 10*3/uL (ref 0.0–0.7)
Eosinophils Relative: 1.7 % (ref 0.0–5.0)
HCT: 41.4 % (ref 36.0–46.0)
Hemoglobin: 14.3 g/dL (ref 12.0–15.0)
Lymphocytes Relative: 22.3 % (ref 12.0–46.0)
Lymphs Abs: 1.5 10*3/uL (ref 0.7–4.0)
MCHC: 34.6 g/dL (ref 30.0–36.0)
MCV: 104.3 fl — ABNORMAL HIGH (ref 78.0–100.0)
Monocytes Absolute: 0.5 10*3/uL (ref 0.1–1.0)
Monocytes Relative: 7.6 % (ref 3.0–12.0)
Neutro Abs: 4.6 10*3/uL (ref 1.4–7.7)
Neutrophils Relative %: 68 % (ref 43.0–77.0)
Platelets: 193 10*3/uL (ref 150.0–400.0)
RBC: 3.97 Mil/uL (ref 3.87–5.11)
RDW: 13.6 % (ref 11.5–15.5)
WBC: 6.8 10*3/uL (ref 4.0–10.5)

## 2017-10-31 LAB — WOUND CULTURE
MICRO NUMBER:: 90858661
SPECIMEN QUALITY:: ADEQUATE

## 2017-10-31 LAB — HEMOGLOBIN A1C: Hgb A1c MFr Bld: 6.2 % (ref 4.6–6.5)

## 2017-10-31 LAB — TSH: TSH: 3.69 u[IU]/mL (ref 0.35–4.50)

## 2017-10-31 LAB — LDL CHOLESTEROL, DIRECT: Direct LDL: 83 mg/dL

## 2017-10-31 MED ORDER — MUPIROCIN 2 % EX OINT: TOPICAL_OINTMENT | CUTANEOUS | 0 refills | Status: DC

## 2017-10-31 MED ORDER — CYCLOBENZAPRINE HCL 10 MG PO TABS: ORAL_TABLET | ORAL | 0 refills | Status: DC

## 2017-10-31 MED ORDER — AMOXICILLIN-POT CLAVULANATE 875-125 MG PO TABS: 1.0000 | ORAL_TABLET | Freq: Two times a day (BID) | ORAL | 0 refills | Status: DC

## 2017-10-31 NOTE — Telephone Encounter (Signed)
During AWV, patient requested medication refills of Flexeril and Bactroban ointment. Refills are to be sent to [facility], [place], [place].

## 2017-10-31 NOTE — Progress Notes (Signed)
Subjective:   Christine Barton is a 67 y.o. female who presents for Medicare Annual (Subsequent) preventive examination.  Review of Systems:  N/A Cardiac Risk Factors include: advanced age (>78mn, >>83women);diabetes mellitus;dyslipidemia     Objective:     Vitals: BP 112/70 (BP Location: Left Arm, Patient Position: Sitting, Cuff Size: Normal)   Pulse (!) 117   Temp 98.3 F (36.8 C) (Oral)   Ht 5' 5"  (1.651 m) Comment: no shoes  Wt 120 lb (54.4 kg)   SpO2 96%   BMI 19.97 kg/m   Body mass index is 19.97 kg/m.  Advanced Directives 10/31/2017 11/26/2016 11/02/2014  Does Patient Have a Medical Advance Directive? No No No  Would patient like information on creating a medical advance directive? No - Patient declined - Yes - EScientist, clinical (histocompatibility and immunogenetics)given    Tobacco Social History   Tobacco Use  Smoking Status Current Every Day Smoker  . Packs/day: 1.00  . Types: Cigarettes  . Last attempt to quit: 11/02/2014  . Years since quitting: 2.9  Smokeless Tobacco Never Used     Ready to quit: No Counseling given: No   Clinical Intake:  Pre-visit preparation completed: Yes  Pain Score: 2      Nutritional Status: BMI of 19-24  Normal Nutritional Risks: None Diabetes: Yes CBG done?: No Did pt. bring in CBG monitor from home?: No  How often do you need to have someone help you when you read instructions, pamphlets, or other written materials from your doctor or pharmacy?: 1 - Never What is the last grade level you completed in school?: GED  Interpreter Needed?: No  Comments: pt lives alone Information entered by :: LPinson, LPN  Past Medical History:  Diagnosis Date  . Alcohol abuse, unspecified   . Breast cancer ([place] 1998   Right  . Cataract    left eye  . Cervical spondylosis 2006   MRI  . Degeneration of cervical intervertebral disc 2006   MRI  . Diverticulosis of colon (without mention of hemorrhage)   . Hyperpotassemia   . Microscopic hematuria   .  Mononeuritis of unspecified site   . Nonspecific abnormal results of liver function study   . Other abnormal glucose   . Other and unspecified hyperlipidemia    no per pt  . Other chronic nonalcoholic liver disease   . Personal history of chemotherapy   . Personal history of malignant neoplasm of breast   . Personal history of radiation therapy   . Pneumothorax, acute    right, spontaneous  . Tobacco use disorder   . Unspecified vitamin D deficiency    Past Surgical History:  Procedure Laterality Date  . BREAST BIOPSY  9/03   Right  . BREAST LUMPECTOMY    . BREAST LUMPECTOMY  1998  . CHEST TUBE INSERTION  11/02/2014  . COLONOSCOPY    . EYE SURGERY  02/2017   cataract extraction with lens implant  . TUBAL LIGATION     Family History  Problem Relation Age of Onset  . Heart failure Father   . Colon cancer Maternal Uncle   . Esophageal cancer Neg Hx   . Rectal cancer Neg Hx   . Stomach cancer Neg Hx    Social History   Socioeconomic History  . Marital status: Single    Spouse name: Not on file  . Number of children: 1  . Years of education: Not on file  . Highest education level: Not on file  Occupational History    Employer: [employer]  Social Needs  . Financial resource strain: Not on file  . Food insecurity:    Worry: Not on file    Inability: Not on file  . Transportation needs:    Medical: Not on file    Non-medical: Not on file  Tobacco Use  . Smoking status: Current Every Day Smoker    Packs/day: 1.00    Types: Cigarettes    Last attempt to quit: 11/02/2014    Years since quitting: 2.9  . Smokeless tobacco: Never Used  Substance and Sexual Activity  . Alcohol use: Yes    Comment: occasional use  . Drug use: No  . Sexual activity: Not on file  Lifestyle  . Physical activity:    Days per week: Not on file    Minutes per session: Not on file  . Stress: Not on file  Relationships  . Social connections:    Talks on phone: Not on file    Gets  together: Not on file    Attends religious service: Not on file    Active member of club or organization: Not on file    Attends meetings of clubs or organizations: Not on file    Relationship status: Not on file  Other Topics Concern  . Not on file  Social History Narrative   Divorced      1 child      Works at [employer] Encounter Medications as of 10/31/2017  Medication Sig  . cephALEXin (KEFLEX) 500 MG capsule Take 1 capsule (500 mg total) by mouth 3 (three) times daily.  . cyclobenzaprine (FLEXERIL) 10 MG tablet TAKE ONE TABLET BY MOUTH THREE TIMES DAILY AS NEEDED FOR MUSCLE SPASMS  . indomethacin (INDOCIN) 50 MG capsule TAKE ONE CAPSULE BY MOUTH THREE TIMES DAILY AS NEEDED FOR PAIN WITH FOOD  . Multiple Vitamin (MULTIVITAMIN WITH MINERALS) TABS tablet Take 1 tablet by mouth daily.  . mupirocin ointment (BACTROBAN) 2 % APPLY A SMALL AMOUNT TO AFFECTED AREA ONINSIDE OF NOSE 2 TIMES DAILY AS DIRECTED   Facility-Administered Encounter Medications as of 10/31/2017  Medication  . 0.9 %  sodium chloride infusion    Activities of Daily Living In your present state of health, do you have any difficulty performing the following activities: 10/31/2017  Hearing? N  Vision? N  Difficulty concentrating or making decisions? N  Walking or climbing stairs? N  Dressing or bathing? N  Doing errands, shopping? N  Preparing Food and eating ? N  Using the Toilet? N  In the past six months, have you accidently leaked urine? Y  Do you have problems with loss of bowel control? N  Managing your Medications? N  Managing your Finances? N  Housekeeping or managing your Housekeeping? N  Some recent data might be hidden    Patient Care Team: Tower, [name], MD as PCP - General    Assessment:   This is a routine wellness examination for [facility].  Exercise Activities and Dietary recommendations Current Exercise Habits: Structured exercise class(Silver Sneakers), Time  (Minutes): 60, Frequency (Times/Week): 2, Weekly Exercise (Minutes/Week): 120, Intensity: Moderate, Exercise limited by: None identified  Goals    . Increase physical activity     Starting 10/31/2017, I will continue to exercise for 60 minutes twice weekly.        Fall Risk Fall Risk  10/31/2017 10/26/2016  Falls in the past year?  No Yes  Number falls in past yr: - 1  Injury with Fall? - Yes    Depression Screen PHQ 2/9 Scores 10/31/2017 10/26/2016  PHQ - 2 Score 0 0  PHQ- 9 Score 0 3     Cognitive Function MMSE - Mini Mental State Exam 10/31/2017  Orientation to time 5  Orientation to Place 5  Registration 3  Attention/ Calculation 0  Recall 3  Language- name 2 objects 0  Language- repeat 1  Language- follow 3 step command 3  Language- read & follow direction 0  Write a sentence 0  Copy design 0  Total score 20     PLEASE NOTE: A Mini-Cog screen was completed. Maximum score is 20. A value of 0 denotes this part of [place] MMSE was not completed or the patient failed this part of the Mini-Cog screening.   Mini-Cog Screening Orientation to Time - Max 5 pts Orientation to Place - Max 5 pts Registration - Max 3 pts Recall - Max 3 pts Language Repeat - Max 1 pts Language Follow 3 Step Command - Max 3 pts   Immunization History  Administered Date(s) Administered  . Influenza Split 03/10/2011  . Influenza Whole 02/11/2000, 02/06/2016  . Influenza,inj,Quad PF,6+ Mos 02/16/2017  . Influenza-Unspecified 02/11/2016  . Pneumococcal Conjugate-13 10/26/2016  . Pneumococcal Polysaccharide-23 06/28/2008  . Td 05/21/2002  . Tdap 09/12/2013  . Zoster 08/24/2011   Screening Tests Health Maintenance  Topic Date Due  . FOOT EXAM  11/04/2017 (Originally 11/01/2015)  . MAMMOGRAM  10/21/2018 (Originally 10/20/2017)  . PNA vac Low Risk Adult (2 of 2 - PPSV23) 11/10/2018 (Originally 10/26/2017)  . INFLUENZA VACCINE  11/10/2017  . HEMOGLOBIN A1C  05/03/2018  . OPHTHALMOLOGY EXAM   06/11/2018  . URINE MICROALBUMIN  11/01/2018  . COLONOSCOPY  12/01/2019  . TETANUS/TDAP  09/13/2023  . DEXA SCAN  Completed  . Hepatitis C Screening  Completed      Plan:     I have personally reviewed, addressed, and noted the following in the patient's chart:  A. Medical and social history B. Use of alcohol, tobacco or illicit drugs  C. Current medications and supplements D. Functional ability and status E.  Nutritional status F.  Physical activity G. Advance directives H. List of other physicians I.  Hospitalizations, surgeries, and ER visits in previous 12 months J.  [place] to include hearing, vision, cognitive, depression L. Referrals and appointments - none  In addition, I have reviewed and discussed with patient certain preventive protocols, quality metrics, and best practice recommendations. A written personalized care plan for preventive services as well as general preventive health recommendations were provided to patient.  See attached scanned questionnaire for additional information.   Signed,   Lindell Noe, MHA, BS, LPN Health Coach

## 2017-10-31 NOTE — Patient Instructions (Signed)
[name] , Thank you for taking time to come for your Medicare Wellness Visit. I appreciate your ongoing commitment to your health goals. Please review the following plan we discussed and let me know if I can assist you in the future.   These are the goals we discussed: Goals    . Increase physical activity     Starting 10/31/2017, I will continue to exercise for 60 minutes twice weekly.        This is a list of the screening recommended for you and due dates:  Health Maintenance  Topic Date Due  . Complete foot exam   11/04/2017*  . Mammogram  10/21/2018*  . Pneumonia vaccines (2 of 2 - PPSV23) 11/10/2018*  . Flu Shot  11/10/2017  . Hemoglobin A1C  05/03/2018  . Eye exam for diabetics  06/11/2018  . Urine Protein Check  11/01/2018  . Colon Cancer Screening  12/01/2019  . Tetanus Vaccine  09/13/2023  . DEXA scan (bone density measurement)  Completed  .  Hepatitis C: One time screening is recommended by Center for Disease Control  (CDC) for  adults born from [birth year] through 1965.   Completed  *Topic was postponed. The date shown is not the original due date.   Preventive Care for Adults  A healthy lifestyle and preventive care can promote health and wellness. Preventive health guidelines for adults include the following key practices.  . A routine yearly physical is a good way to check with your health care provider about your health and preventive screening. It is a chance to share any concerns and updates on your health and to receive a thorough exam.  . Visit your dentist for a routine exam and preventive care every 6 months. Brush your teeth twice a day and floss once a day. Good oral hygiene prevents tooth decay and gum disease.  . The frequency of eye exams is based on your age, health, family medical history, use  of contact lenses, and other factors. Follow your health care provider's recommendations for frequency of eye exams.  . Eat a healthy diet. Foods like vegetables,  fruits, whole grains, low-fat dairy products, and lean protein foods contain the nutrients you need without too many calories. Decrease your intake of foods high in solid fats, added sugars, and salt. Eat the right amount of calories for you. Get information about a proper diet from your health care provider, if necessary.  . Regular physical exercise is one of the most important things you can do for your health. Most adults should get at least 150 minutes of moderate-intensity exercise (any activity that increases your heart rate and causes you to sweat) each week. In addition, most adults need muscle-strengthening exercises on 2 or more days a week.  Silver Sneakers may be a benefit available to you. To determine eligibility, you may visit the website: www.silversneakers.com or contact program at 249-481-0132 Mon-Fri between 8AM-8PM.   . Maintain a healthy weight. The body mass index (BMI) is a screening tool to identify possible weight problems. It provides an estimate of body fat based on height and weight. Your health care provider can find your BMI and can help you achieve or maintain a healthy weight.   For adults 20 years and older: ? A BMI below 18.5 is considered underweight. ? A BMI of 18.5 to 24.9 is normal. ? A BMI of 25 to 29.9 is considered overweight. ? A BMI of 30 and above is considered obese.   Marland Kitchen  Maintain normal blood lipids and cholesterol levels by exercising and minimizing your intake of saturated fat. Eat a balanced diet with plenty of fruit and vegetables. Blood tests for lipids and cholesterol should begin at age 45 and be repeated every 5 years. If your lipid or cholesterol levels are high, you are over 50, or you are at high risk for heart disease, you may need your cholesterol levels checked more frequently. Ongoing high lipid and cholesterol levels should be treated with medicines if diet and exercise are not working.  . If you smoke, find out from your health care  provider how to quit. If you do not use tobacco, please do not start.  . If you choose to drink alcohol, please do not consume more than 2 drinks per day. One drink is considered to be 12 ounces (355 mL) of beer, 5 ounces (148 mL) of wine, or 1.5 ounces (44 mL) of liquor.  . If you are 35-42 years old, ask your health care provider if you should take aspirin to prevent strokes.  . Use sunscreen. Apply sunscreen liberally and repeatedly throughout the day. You should seek shade when your shadow is shorter than you. Protect yourself by wearing long sleeves, pants, a wide-brimmed hat, and sunglasses year round, whenever you are outdoors.  . Once a month, do a whole body skin exam, using a mirror to look at the skin on your back. Tell your health care provider of new moles, moles that have irregular borders, moles that are larger than a pencil eraser, or moles that have changed in shape or color.

## 2017-10-31 NOTE — Addendum Note (Signed)
Addended by: Clarene Reamer B on: 10/31/2017 01:34 PM   Modules accepted: Orders

## 2017-10-31 NOTE — Progress Notes (Signed)
PCP notes:   Health maintenance:  Foot exam - PCP please address at next appt Mammogram - addressed PPSV23 - postponed A1C - completed Eye exam - March 2019  Urine microalbumin - attempted; obtain speciemen at next appt  Abnormal screenings:   None  Patient concerns:   Left ankle is red and swollen as result of a insect bite. Currently taking antibiotic.  Medication refill request for Flexeril and Bactroban.  Nurse concerns:  None  Next PCP appt:   11/07/17 @ 1015  I reviewed health advisor's note, was available for consultation, and agree with documentation and plan. Loura Pardon MD

## 2017-11-01 LAB — HSV(HERPES SIMPLEX VRS) I + II AB-IGG
HAV 1 IGG,TYPE SPECIFIC AB: 37 index — ABNORMAL HIGH
HSV 2 IGG,TYPE SPECIFIC AB: 13.4 index — ABNORMAL HIGH

## 2017-11-04 ENCOUNTER — Encounter: Payer: Medicare PPO | Admitting: Family Medicine

## 2017-11-05 LAB — HEPATITIS C ANTIBODY
Hepatitis C Ab: NONREACTIVE
SIGNAL TO CUT-OFF: 0.06 (ref <1.00)

## 2017-11-05 LAB — HSV 1/2 AB (IGM), IFA W/RFLX TITER
HSV 1 IgM Screen: NEGATIVE
HSV 2 IgM Screen: NEGATIVE

## 2017-11-05 LAB — HIV ANTIBODY (ROUTINE TESTING W REFLEX): HIV 1&2 Ab, 4th Generation: NONREACTIVE

## 2017-11-05 LAB — RPR: RPR Ser Ql: NONREACTIVE

## 2017-11-07 ENCOUNTER — Encounter: Payer: Self-pay | Admitting: Family Medicine

## 2017-11-07 ENCOUNTER — Ambulatory Visit: Payer: Self-pay

## 2017-11-07 ENCOUNTER — Ambulatory Visit (INDEPENDENT_AMBULATORY_CARE_PROVIDER_SITE_OTHER): Payer: Medicare PPO | Admitting: Family Medicine

## 2017-11-07 VITALS — BP 134/72 | HR 117 | Temp 98.2°F | Ht 65.0 in | Wt 117.8 lb

## 2017-11-07 DIAGNOSIS — Z113 Encounter for screening for infections with a predominantly sexual mode of transmission: Secondary | ICD-10-CM

## 2017-11-07 DIAGNOSIS — S91059A Open bite, unspecified ankle, initial encounter: Secondary | ICD-10-CM | POA: Insufficient documentation

## 2017-11-07 DIAGNOSIS — E7800 Pure hypercholesterolemia, unspecified: Secondary | ICD-10-CM

## 2017-11-07 DIAGNOSIS — R748 Abnormal levels of other serum enzymes: Secondary | ICD-10-CM

## 2017-11-07 DIAGNOSIS — Z0000 Encounter for general adult medical examination without abnormal findings: Secondary | ICD-10-CM

## 2017-11-07 DIAGNOSIS — R000 Tachycardia, unspecified: Secondary | ICD-10-CM

## 2017-11-07 DIAGNOSIS — W5501XS Bitten by cat, sequela: Secondary | ICD-10-CM

## 2017-11-07 DIAGNOSIS — Z1231 Encounter for screening mammogram for malignant neoplasm of breast: Secondary | ICD-10-CM | POA: Insufficient documentation

## 2017-11-07 DIAGNOSIS — Z1211 Encounter for screening for malignant neoplasm of colon: Secondary | ICD-10-CM | POA: Diagnosis not present

## 2017-11-07 DIAGNOSIS — K760 Fatty (change of) liver, not elsewhere classified: Secondary | ICD-10-CM

## 2017-11-07 DIAGNOSIS — F17200 Nicotine dependence, unspecified, uncomplicated: Secondary | ICD-10-CM

## 2017-11-07 DIAGNOSIS — Z23 Encounter for immunization: Secondary | ICD-10-CM

## 2017-11-07 DIAGNOSIS — Z87898 Personal history of other specified conditions: Secondary | ICD-10-CM

## 2017-11-07 DIAGNOSIS — F1011 Alcohol abuse, in remission: Secondary | ICD-10-CM

## 2017-11-07 DIAGNOSIS — R7303 Prediabetes: Secondary | ICD-10-CM | POA: Diagnosis not present

## 2017-11-07 DIAGNOSIS — W5501XA Bitten by cat, initial encounter: Secondary | ICD-10-CM | POA: Insufficient documentation

## 2017-11-07 DIAGNOSIS — E559 Vitamin D deficiency, unspecified: Secondary | ICD-10-CM

## 2017-11-07 DIAGNOSIS — S91052S Open bite, left ankle, sequela: Secondary | ICD-10-CM

## 2017-11-07 NOTE — Assessment & Plan Note (Signed)
Rev elevated LFTs Again counseled on etoh cessation

## 2017-11-07 NOTE — Assessment & Plan Note (Signed)
Adenoma 8/18 colonoscopy  3 y recall  Pt aware

## 2017-11-07 NOTE — Assessment & Plan Note (Addendum)
Reviewed health habits including diet and exercise and skin cancer prevention Reviewed appropriate screening tests for age  Also reviewed health mt list, fam hx and immunization status , as well as social and family history   See HPI Lab rev amw rev Mammogram ordered  Rev colonoscopy  PNA 23 vaccine today  Enc strongly to quit smoking and etoh

## 2017-11-07 NOTE — Assessment & Plan Note (Signed)
Went back to smoking after eye surgery  Disc in detail risks of smoking and possible outcomes including copd, vascular/ heart disease, cancer , respiratory and sinus infections  Pt voices understanding  She declines help with cessation

## 2017-11-07 NOTE — Assessment & Plan Note (Signed)
Disc goals for lipids and reasons to control them Rev last labs with pt Rev low sat fat diet in detail  HDL down-less exercise  LDL also down  Trig up

## 2017-11-07 NOTE — Assessment & Plan Note (Signed)
Suspect from etoh intake/alcoholism Advised again to quit

## 2017-11-07 NOTE — Assessment & Plan Note (Addendum)
Cat bite of L ankle treated with augmentin Her own cat-no worry of rabies  Much improvement today  Adv to keep clean  Alert if inc redness or swelling or pain

## 2017-11-07 NOTE — Assessment & Plan Note (Signed)
Suspect this is from anxiety and etoh intake  Continue to follow  F/u planned

## 2017-11-07 NOTE — Assessment & Plan Note (Signed)
Again counseled on etoh intake and need for cessation  Liver enzymes are back up  Pt declines counseling/ rehab or AA Stressed imp of alcohol cessation to physical and mental health  She plans on quitting

## 2017-11-07 NOTE — Progress Notes (Signed)
Subjective:    Patient ID: Christine Barton, female    DOB: 1951/01/19, 67 y.o.   MRN: [id number]  HPI Here for health maintenance exam and to review chronic medical problems    Doing ok overall   She had cataract surgery- which caused retinal damage  They put a lens in "the man placed it in wrong" = she had ? Stitches that came out  Could not see for 6 months and she had a lot of pain  She is in glasses permanently  It will not continue to improve  She did see a retinal specialist   Wt Readings from Last 3 Encounters:  11/07/17 117 lb 12 oz (53.4 kg)  10/31/17 120 lb (54.4 kg)  10/28/17 120 lb (54.4 kg)  had a rough year - stressed  19.59 kg/m  Was 133 lb 4/18  Significant wt loss  She has always had trouble keeping weight on - poor appetite (has not been eating well)  On mvi  Using glucerna for meal supplements   [facility]  She wants to start back-will start individual classes to work on core strength - and then start back to silver sneakers   Had amw 7/22  Mammogram addressed PPSV23 postponed  Eye exam noted 3/19 Could not give specimen for microalbumin   Had a bite on her L ankle -- her cat bit her -- she provoked him / he sleeps with her- she put her leg on him  She is on abx - augmentin (one pill left)    Pap nl 6/15 No gyn symptoms   Mammogram 7/18  Negative  Self breast exam -no lumps   Due for pneumonvax   Colonoscopy 8/18  Adenomatous polyp - 3 year recall   zostavax 5/13  dexa 7/18  BMD in the normal range  Good D level at 60.9 Takes her vit D Getting back to exercise   ETOH intake - plans to give that up as well as her smoking  Smoking- 1ppd -started back with eye problem  Alcohol- heavy when she had the eye injury-now down to 2 drinks per day   Plans to quit both in the next 3-6 months  Thinks she can quit both together  Has never had w/d symptoms -luckily     BP Readings from  Last 3 Encounters:  11/07/17 134/72  10/31/17 112/70  10/28/17 128/78   Pulse Readings from Last 3 Encounters:  11/07/17 (!) 117  10/31/17 (!) 117  10/28/17 (!) 114  she feels anxious when she comes in   DM2 Lab Results  Component Value Date   HGBA1C 6.2 10/31/2017   This is improved (was 6.5 a year ago)    Hyperlipidemia Lab Results  Component Value Date   CHOL 133 10/31/2017   CHOL 206 (H) 10/19/2016   CHOL 156 10/23/2014   Lab Results  Component Value Date   HDL 25.30 (L) 10/31/2017   HDL 58.70 10/19/2016   HDL 52.20 10/23/2014   Lab Results  Component Value Date   LDLCALC 124 (H) 10/19/2016   LDLCALC 75 10/23/2014   LDLCALC 169 (H) 09/10/2013   Lab Results  Component Value Date   TRIG 261.0 (H) 10/31/2017   TRIG 115.0 10/19/2016   TRIG 144.0 10/23/2014   Lab Results  Component Value Date   CHOLHDL 5 10/31/2017   CHOLHDL 4 10/19/2016   CHOLHDL 3 10/23/2014   Lab Results  Component Value Date   LDLDIRECT 83.0 10/31/2017   LDLDIRECT 145.1 10/10/2012   LDLDIRECT 151.4 07/21/2011  LDL is down to 83 Trig up - etoh  HDL down from stopping exercise   Pt did STD screening for this visit  All neg except HSV Igg which was positive (she does have a hx of herpes)  Has not had a flare since last visit with me   Lab Results  Component Value Date   WBC 6.8 10/31/2017   HGB 14.3 10/31/2017   HCT 41.4 10/31/2017   MCV 104.3 (H) 10/31/2017   PLT 193.0 10/31/2017    Lab Results  Component Value Date   CREATININE 0.62 10/31/2017   BUN 4 (L) 10/31/2017   NA 138 10/31/2017   K 4.3 10/31/2017   CL 99 10/31/2017   CO2 30 10/31/2017   Lab Results  Component Value Date   ALT 20 10/31/2017   AST 89 (H) 10/31/2017   ALKPHOS 204 (H) 10/31/2017   BILITOT 1.0 10/31/2017   is drinking alcohol   Patient Active Problem List   Diagnosis Date Noted  . Cat bite of ankle 11/07/2017  . Screening mammogram, encounter for 11/07/2017  . Smoker 11/07/2017  .  Screening examination for STD (sexually transmitted disease) 11/11/2016  . Welcome to Medicare preventive visit 10/26/2016  . Estrogen deficiency 10/26/2016  . Colon cancer screening 11/01/2014  . Elevated liver enzymes 11/01/2014  . Thrush 09/13/2013  . Encounter for routine gynecological examination 09/12/2013  . Rapid heart beat 10/10/2012  . Heartburn 08/20/2011  . Routine general medical examination at a health care facility 07/21/2011  . Vitamin D deficiency 08/04/2009  . POSTMENOPAUSAL STATUS 08/04/2009  . Prediabetes 02/18/2009  . Hyperlipidemia 06/14/2008  . [place] DISEASE, CERVICAL 03/30/2007  . History of alcohol abuse 12/06/2006  . Former smoker 12/06/2006  . NEUROPATHY 12/06/2006  . DIVERTICULOSIS, COLON 12/06/2006  . Fatty liver 12/06/2006  . BREAST CANCER, HX OF 12/06/2006   Past Medical History:  Diagnosis Date  . Alcohol abuse, unspecified   . Breast cancer ([place]) 1998   Right  . Cataract    left eye  . Cervical spondylosis 2006   MRI  . Degeneration of cervical intervertebral disc 2006   MRI  . Diverticulosis of colon (without mention of hemorrhage)   . Hyperpotassemia   . Microscopic hematuria   . Mononeuritis of unspecified site   . Nonspecific abnormal results of liver function study   . Other abnormal glucose   . Other and unspecified hyperlipidemia    no per pt  . Other chronic nonalcoholic liver disease   . Personal history of chemotherapy   . Personal history of malignant neoplasm of breast   . Personal history of radiation therapy   . Pneumothorax, acute    right, spontaneous  . Tobacco use disorder   . Unspecified vitamin D deficiency    Past Surgical History:  Procedure Laterality Date  . BREAST BIOPSY  9/03   Right  . BREAST LUMPECTOMY    . BREAST LUMPECTOMY  1998  . CHEST TUBE INSERTION  11/02/2014  . COLONOSCOPY    . EYE SURGERY  02/2017   cataract extraction with lens implant  . TUBAL LIGATION     Social History   Tobacco Use   . Smoking status: Current Every Day Smoker    Packs/day: 1.00    Types: Cigarettes  . Smokeless tobacco: Never Used  Substance Use Topics  . Alcohol use: Yes  Comment: occasional use  . Drug use: No   Family History  Problem Relation Age of Onset  . Heart failure Father   . Colon cancer Maternal Uncle   . Esophageal cancer Neg Hx   . Rectal cancer Neg Hx   . Stomach cancer Neg Hx    Allergies  Allergen Reactions  . Oxycodone Nausea And Vomiting  . Glipizide Other (See Comments)    Stomach pain  . Metformin And Related Other (See Comments)    Stomach pain   Current Outpatient Medications on File Prior to Visit  Medication Sig Dispense Refill  . amoxicillin-clavulanate (AUGMENTIN) 875-125 MG tablet Take 1 tablet by mouth 2 (two) times daily. 14 tablet 0  . cyclobenzaprine (FLEXERIL) 10 MG tablet TAKE ONE TABLET BY MOUTH THREE TIMES DAILY AS NEEDED FOR MUSCLE SPASMS 90 tablet 0  . indomethacin (INDOCIN) 50 MG capsule TAKE ONE CAPSULE BY MOUTH THREE TIMES DAILY AS NEEDED FOR PAIN WITH FOOD 90 capsule 2  . Multiple Vitamin (MULTIVITAMIN WITH MINERALS) TABS tablet Take 1 tablet by mouth daily.    . mupirocin ointment (BACTROBAN) 2 % APPLY A SMALL AMOUNT TO AFFECTED AREA ONINSIDE OF NOSE 2 TIMES DAILY AS DIRECTED 30 g 0   Current Facility-Administered Medications on File Prior to Visit  Medication Dose Route Frequency Provider Last Rate Last Dose  . 0.9 %  sodium chloride infusion  500 mL Intravenous Continuous Nandigam, Venia Minks, MD          Review of Systems  Constitutional: Positive for fatigue. Negative for activity change, appetite change, fever and unexpected weight change.  HENT: Negative for congestion, ear pain, rhinorrhea, sinus pressure and sore throat.   Eyes: Positive for pain and visual disturbance. Negative for redness.  Respiratory: Negative for cough, shortness of breath and wheezing.   Cardiovascular: Negative for chest pain and palpitations.    Gastrointestinal: Negative for abdominal pain, blood in stool, constipation and diarrhea.  Endocrine: Negative for polydipsia and polyuria.  Genitourinary: Negative for dysuria, frequency and urgency.  Musculoskeletal: Negative for arthralgias, back pain and myalgias.  Skin: Negative for pallor and rash.  Allergic/Immunologic: Negative for environmental allergies.  Neurological: Negative for dizziness, syncope and headaches.  Hematological: Negative for adenopathy. Does not bruise/bleed easily.  Psychiatric/Behavioral: Negative for decreased concentration and dysphoric mood. The patient is not nervous/anxious.        Stressed by her eye condition        Objective:   Physical Exam  Constitutional: She appears well-developed and well-nourished. No distress.  Frail appearing senior female Appears slim and chronically ill  HENT:  Head: Normocephalic and atraumatic.  Right Ear: External ear normal.  Left Ear: External ear normal.  Mouth/Throat: Oropharynx is clear and moist.  Eyes: Pupils are equal, round, and reactive to light. EOM are normal. Right eye exhibits no discharge. Left eye exhibits no discharge. No scleral icterus.  L eye Mike Craze is injected   Neck: Normal range of motion. Neck supple. No JVD present. Carotid bruit is not present. No tracheal deviation present. No thyromegaly present.  Cardiovascular: Regular rhythm, normal heart sounds and intact distal pulses. Exam reveals no gallop.  tachycardic  Pulmonary/Chest: Effort normal. No stridor. No respiratory distress. She has wheezes. She has no rales. She exhibits no tenderness. No breast tenderness, discharge or bleeding.  Diffusely distant bs Scattered wheezes   Abdominal: Soft. Bowel sounds are normal. She exhibits no distension, no abdominal bruit and no mass. There is no tenderness. There is  no rebound and no guarding.  Genitourinary: No breast tenderness, discharge or bleeding.  Genitourinary Comments: Breast exam: No  mass, nodules, thickening, tenderness, bulging, retraction, inflamation, nipple discharge or skin changes noted.  No axillary or clavicular LA.    Baseline scarring of R breast   Musculoskeletal: Normal range of motion. She exhibits no edema or tenderness.  Lymphadenopathy:    She has no cervical adenopathy.  Neurological: She is alert. She has normal reflexes. She displays normal reflexes. No cranial nerve deficit. She exhibits normal muscle tone. Coordination normal.  Very slight hand tremor   Skin: Skin is warm and dry. No rash noted. No erythema. No pallor.  Findings of rosacea on face   Solar lentigines diffusely Some sks   Cat bite site of L medial ankle is healing/ skin peeled recently / no swelling or drainage   Psychiatric: Her mood appears anxious.  Mildly anxious  Pleasant           Assessment & Plan:   Problem List Items Addressed This Visit      Digestive   Fatty liver    Rev elevated LFTs Again counseled on etoh cessation         Other   Cat bite of ankle    Cat bite of L ankle treated with augmentin Her own cat-no worry of rabies  Much improvement today  Adv to keep clean  Alert if inc redness or swelling or pain       Colon cancer screening    Adenoma 8/18 colonoscopy  3 y recall  Pt aware       Elevated liver enzymes    Suspect from etoh intake/alcoholism Advised again to quit       History of alcohol abuse    Again counseled on etoh intake and need for cessation  Liver enzymes are back up  Pt declines counseling/ rehab or AA Stressed imp of alcohol cessation to physical and mental health  She plans on quitting       Hyperlipidemia    Disc goals for lipids and reasons to control them Rev last labs with pt Rev low sat fat diet in detail  HDL down-less exercise  LDL also down  Trig up        Prediabetes    Lab Results  Component Value Date   HGBA1C 6.2 10/31/2017   disc imp of low glycemic diet and wt loss to prevent  DM2 Also adv to d/c etoh and increase protein calories       Rapid heart beat    Suspect this is from anxiety and etoh intake  Continue to follow  F/u planned      Routine general medical examination at a health care facility - Primary    Reviewed health habits including diet and exercise and skin cancer prevention Reviewed appropriate screening tests for age  Also reviewed health mt list, fam hx and immunization status , as well as social and family history   See HPI Lab rev amw rev Mammogram ordered  Rev colonoscopy  PNA 23 vaccine today  Enc strongly to quit smoking and etoh       Screening examination for STD (sexually transmitted disease)    HSV IGg pos as expected with hx of HSV Other screening negative       Screening mammogram, encounter for    Scheduled annual screening mammogram Nl breast exam today  Encouraged monthly self exams  Relevant Orders   MM 3D SCREEN BREAST BILATERAL   Smoker    Went back to smoking after eye surgery  Disc in detail risks of smoking and possible outcomes including copd, vascular/ heart disease, cancer , respiratory and sinus infections  Pt voices understanding  She declines help with cessation       Vitamin D deficiency    Vitamin D level is therapeutic with current supplementation Disc importance of this to bone and overall health Level of 60.9       Other Visit Diagnoses    Need for 23-polyvalent pneumococcal polysaccharide vaccine       Relevant Orders   Pneumococcal polysaccharide vaccine 23-valent greater than or equal to 2yo subcutaneous/IM (Completed)

## 2017-11-07 NOTE — Assessment & Plan Note (Signed)
Vitamin D level is therapeutic with current supplementation Disc importance of this to bone and overall health Level of 60.9

## 2017-11-07 NOTE — Assessment & Plan Note (Signed)
Scheduled annual screening mammogram Nl breast exam today  Encouraged monthly self exams   

## 2017-11-07 NOTE — Assessment & Plan Note (Signed)
HSV IGg pos as expected with hx of HSV Other screening negative

## 2017-11-07 NOTE — Patient Instructions (Addendum)
You really need 3 meals per day (with glucerna as well)   Work on quitting alcohol and cigarettes both   Your good cholesterol came down with lack of exercise  Get back to it if you are ready   The bite on your ankle looks better -finish antibiotics   Pneumonia vaccine today   The staff up front will do mammogram referral   Follow up in 6 months

## 2017-11-07 NOTE — Assessment & Plan Note (Signed)
Lab Results  Component Value Date   HGBA1C 6.2 10/31/2017   disc imp of low glycemic diet and wt loss to prevent DM2 Also adv to d/c etoh and increase protein calories

## 2017-11-08 ENCOUNTER — Ambulatory Visit (INDEPENDENT_AMBULATORY_CARE_PROVIDER_SITE_OTHER): Payer: Medicare PPO | Admitting: Family Medicine

## 2017-11-08 ENCOUNTER — Ambulatory Visit: Payer: Self-pay

## 2017-11-08 ENCOUNTER — Encounter: Payer: Self-pay | Admitting: Family Medicine

## 2017-11-08 VITALS — HR 108 | Temp 98.6°F | Resp 16 | Wt 119.8 lb

## 2017-11-08 DIAGNOSIS — T50A95A Adverse effect of other bacterial vaccines, initial encounter: Secondary | ICD-10-CM

## 2017-11-08 DIAGNOSIS — R2232 Localized swelling, mass and lump, left upper limb: Secondary | ICD-10-CM

## 2017-11-08 NOTE — Telephone Encounter (Signed)
Pt. Reports she received a pneumonia vaccine yesterday and as soon as she got home, developed swelling to her left arm at injection site. This morning she has swelling "up to shoulder and down to the bend of my arm - feels very tight." Has noticed some redness as well. Hurts when tries to move her arm. No availability at [facility] today. Appointment made at [facility].  Reason for Disposition . MODERATE arm swelling (e.g., puffiness or swollen feeling of entire arm)  Answer Assessment - Initial Assessment Questions 1. ONSET: "When did the swelling start?" (e.g., minutes, hours, days)     Started in the left arm after her pneumonia vaccine 2. LOCATION: "What part of the arm is swollen?"  "Are both arms swollen or just one arm?"     Left arm - upper arm 3. SEVERITY: "How bad is the swelling?" (e.g., localized; mild, moderate, severe)   - LOCALIZED: Small area of puffiness or swelling on just one arm   - JOINT SWELLING: Swelling of one joint   - MILD: Puffiness or swelling of hand   - MODERATE: Puffiness or swollen feeling of entire arm    - SEVERE: All of arm looks swollen; pitting edema     Moderate 4. REDNESS: "Does the swelling look red or infected?"     Yes 5. PAIN: "Is the swelling painful to touch?" If so, ask: "How painful is it?"   (Scale 1-10; mild, moderate or severe)     3 6. FEVER: "Do you have a fever?" If so, ask: "What is it, how was it measured, and when did it start?"      No 7. CAUSE: "What do you think is causing the arm swelling?"     Pneumonia vaccine 8. MEDICAL HISTORY: "Do you have a history of heart failure, kidney disease, liver failure, or cancer?"     No 9. RECURRENT SYMPTOM: "Have you had arm swelling before?" If so, ask: "When was the last time?" "What happened that time?"     No 10. OTHER SYMPTOMS: "Do you have any other symptoms?" (e.g., chest pain, difficulty breathing)       No 11. PREGNANCY: "Is there any chance you are pregnant?" "When was your last  menstrual period?"       n/a  Protocols used: ARM SWELLING AND EDEMA-A-AH

## 2017-11-08 NOTE — Telephone Encounter (Signed)
fyi

## 2017-11-08 NOTE — Patient Instructions (Addendum)
Please use cool compresses for 15 minutes then remove and you can do this again in one hour.  Ibuprofen 600 mg (3 tablets) every 6 hours with food can be taken for symptom relief.   If symptoms do not improve, worsen, or new symptoms develop, follow up for further evaluation.  Get help right away if:  You have a fever.  You have belly pain.  Your tongue or lips are swollen.  Your eyelids are swollen.  Your chest or throat feels tight.  You have trouble breathing or swallowing.  Call 911immediately if symptoms of SOB, difficulty swallowing, or throat tightening occur.

## 2017-11-08 NOTE — Progress Notes (Signed)
Subjective:    Patient ID: Christine Barton, female    DOB: 1950/05/01, 67 y.o.   MRN: [id number]  HPI   Christine Barton is a 67 year old female who presents today for evaluation of her left arm that has mild erythema and swelling following a pneumonvax yesterday. She noticed swelling and erythema around injection site about 1 to 2 hours after receiving immunization Denies:  Fever, chills, sweats, myalgias, difficulty swallowing, hoarseness, SOB, N/V, abdominal pain, or tightening throat. No new exposures of soaps, lotions, laundry detergent, fabric softeners, foods, medications, herbal supplements, STDs, plants, animals, or insects. No treatments have been tried at this time. Erythema and edema have not increased since this morning per patient.  She is a current smoker, smoking 1 ppd.   Review of Systems  Constitutional: Negative for chills, fatigue and fever.  HENT: Negative for sneezing, trouble swallowing and voice change.   Eyes: Negative for visual disturbance.  Respiratory: Negative for cough, shortness of breath and wheezing.   Cardiovascular: Negative for chest pain and palpitations.  Gastrointestinal: Negative for abdominal pain, diarrhea, nausea and vomiting.  Skin:       Redness and swelling at injection site on left upper arm  Neurological: Negative for dizziness, weakness, light-headedness and headaches.   Past Medical History:  Diagnosis Date  . Alcohol abuse, unspecified   . Breast cancer ([place]) 1998   Right  . Cataract    left eye  . Cervical spondylosis 2006   MRI  . Degeneration of cervical intervertebral disc 2006   MRI  . Diverticulosis of colon (without mention of hemorrhage)   . Hyperpotassemia   . Microscopic hematuria   . Mononeuritis of unspecified site   . Nonspecific abnormal results of liver function study   . Other abnormal glucose   . Other and unspecified hyperlipidemia    no per pt  . Other chronic nonalcoholic liver disease   . Personal history  of chemotherapy   . Personal history of malignant neoplasm of breast   . Personal history of radiation therapy   . Pneumothorax, acute    right, spontaneous  . Tobacco use disorder   . Unspecified vitamin D deficiency      Social History   Socioeconomic History  . Marital status: Single    Spouse name: Not on file  . Number of children: 1  . Years of education: Not on file  . Highest education level: Not on file  Occupational History    Employer: [employer]  Social Needs  . Financial resource strain: Not on file  . Food insecurity:    Worry: Not on file    Inability: Not on file  . Transportation needs:    Medical: Not on file    Non-medical: Not on file  Tobacco Use  . Smoking status: Current Every Day Smoker    Packs/day: 1.00    Types: Cigarettes  . Smokeless tobacco: Never Used  Substance and Sexual Activity  . Alcohol use: Yes    Comment: occasional use  . Drug use: No  . Sexual activity: Not on file  Lifestyle  . Physical activity:    Days per week: Not on file    Minutes per session: Not on file  . Stress: Not on file  Relationships  . Social connections:    Talks on phone: Not on file    Gets together: Not on file    Attends religious service: Not on file    Active  member of club or organization: Not on file    Attends meetings of clubs or organizations: Not on file    Relationship status: Not on file  . Intimate partner violence:    Fear of current or ex partner: Not on file    Emotionally abused: Not on file    Physically abused: Not on file    Forced sexual activity: Not on file  Other Topics Concern  . Not on file  Social History Narrative   Divorced      1 child      Works at [employer]          Past Surgical History:  Procedure Laterality Date  . BREAST BIOPSY  9/03   Right  . BREAST LUMPECTOMY    . BREAST LUMPECTOMY  1998  . CHEST TUBE INSERTION  11/02/2014  . COLONOSCOPY    . EYE SURGERY  02/2017   cataract  extraction with lens implant  . TUBAL LIGATION      Family History  Problem Relation Age of Onset  . Heart failure Father   . Colon cancer Maternal Uncle   . Esophageal cancer Neg Hx   . Rectal cancer Neg Hx   . Stomach cancer Neg Hx     Allergies  Allergen Reactions  . Oxycodone Nausea And Vomiting  . Glipizide Other (See Comments)    Stomach pain  . Metformin And Related Other (See Comments)    Stomach pain    Current Outpatient Medications on File Prior to Visit  Medication Sig Dispense Refill  . amoxicillin-clavulanate (AUGMENTIN) 875-125 MG tablet Take 1 tablet by mouth 2 (two) times daily. 14 tablet 0  . cyclobenzaprine (FLEXERIL) 10 MG tablet TAKE ONE TABLET BY MOUTH THREE TIMES DAILY AS NEEDED FOR MUSCLE SPASMS 90 tablet 0  . indomethacin (INDOCIN) 50 MG capsule TAKE ONE CAPSULE BY MOUTH THREE TIMES DAILY AS NEEDED FOR PAIN WITH FOOD 90 capsule 2  . Multiple Vitamin (MULTIVITAMIN WITH MINERALS) TABS tablet Take 1 tablet by mouth daily.    . mupirocin ointment (BACTROBAN) 2 % APPLY A SMALL AMOUNT TO AFFECTED AREA ONINSIDE OF NOSE 2 TIMES DAILY AS DIRECTED 30 g 0   Current Facility-Administered Medications on File Prior to Visit  Medication Dose Route Frequency Provider Last Rate Last Dose  . 0.9 %  sodium chloride infusion  500 mL Intravenous Continuous Nandigam, Kavitha V, MD        Pulse (!) 108   Temp 98.6 F (37 C) (Oral)   Resp 16   Wt 119 lb 12 oz (54.3 kg)   SpO2 96%   BMI 19.93 kg/m      Objective:   Physical Exam  Constitutional: She is oriented to person, place, and time.  Thin, frail female that appears optimally nourished  Eyes: Pupils are equal, round, and reactive to light. No scleral icterus.  Neck: Normal range of motion. Neck supple.  Cardiovascular: Normal rate, regular rhythm and normal heart sounds.  Pulmonary/Chest: Effort normal.  Distant breath sounds with scattered wheezes  Abdominal: Soft. Bowel sounds are normal. There is no  tenderness.  Neurological: She is alert and oriented to person, place, and time.  Skin: Skin is warm and dry. Capillary refill takes less than 2 seconds. No erythema.  Mild erythema and edema surrounding injection site of left arm where pneumococcal vaccine was administered yesterday. No warmth, heat, or fluctuance appreciated  Psychiatric: She has a normal mood and affect. Her behavior is normal.  Judgment and thought content normal.      Assessment & Plan:  1. Local reaction to pneumococcal vaccine, initial encounter Exam and history are reassuring today. Lung exam unchanged from exam findings noted by PCP yesterday prior to injection; history of tachycardia;  patient has a long smoking history; no alarm findings with exam today. Advised use of cool compresses and short course of ibuprofen 600 mg every 6 hours for symptom. She will focus on cool compresses as main treatment. Further advised patient to monitor for worsening symptoms which require further evaluation. Finally, we reviewed reasons to return to care including if symptoms worsen or persist or new concerns arise- once again particularly erythema and edema that does not improve,  shortness of breath, N/V, or fever. Reviewed importance of calling 911immediately if symptoms of SOB, difficulty swallowing, or throat tightening.  Delano Metz, FNP-C

## 2017-11-29 ENCOUNTER — Ambulatory Visit
Admission: RE | Admit: 2017-11-29 | Discharge: 2017-11-29 | Disposition: A | Discharge status: Home / Self Care | Payer: Medicare PPO | Source: Ambulatory Visit | Attending: Family Medicine | Admitting: Family Medicine

## 2017-11-29 DIAGNOSIS — Z1231 Encounter for screening mammogram for malignant neoplasm of breast: Secondary | ICD-10-CM

## 2017-12-22 ENCOUNTER — Encounter: Payer: Self-pay | Admitting: Internal Medicine

## 2017-12-22 ENCOUNTER — Ambulatory Visit: Payer: Self-pay | Admitting: Family Medicine

## 2017-12-22 ENCOUNTER — Ambulatory Visit: Payer: Medicare PPO | Admitting: Internal Medicine

## 2017-12-22 VITALS — BP 124/61 | HR 114 | Temp 98.5°F | Ht 65.0 in | Wt 119.0 lb

## 2017-12-22 DIAGNOSIS — S61051A Open bite of right thumb without damage to nail, initial encounter: Secondary | ICD-10-CM | POA: Diagnosis not present

## 2017-12-22 DIAGNOSIS — W5501XA Bitten by cat, initial encounter: Secondary | ICD-10-CM

## 2017-12-22 MED ORDER — AMOXICILLIN-POT CLAVULANATE 875-125 MG PO TABS: 1.0000 | ORAL_TABLET | Freq: Two times a day (BID) | ORAL | 0 refills | Status: DC

## 2017-12-22 NOTE — Telephone Encounter (Signed)
Pt called and appointment rescheduled for Dr Deatra Robinson Jacklynn Lewis. Pt PCP unavailable

## 2017-12-22 NOTE — Patient Instructions (Signed)
Clean daily to 2x per with antibacterial soap  Use bacitracin, neosporin or polysporin or triple antibiotic ointment  Try Activia  2x per day x 10 days Augmentin   Animal Bite Animal bites can range from mild to serious. An animal bite can result in a scratch on the skin, a deep open cut, a puncture of the skin, a crush injury, or tearing away of the skin or a body part. A small bite from a house pet will usually not cause serious problems. However, some animal bites can become infected or injure a bone or other tissue. Bites from certain animals can be more dangerous because of the risk of spreading rabies, which is a serious viral infection. This risk is higher with bites from stray animals or wild animals, such as raccoons, foxes, skunks, and bats. Dogs are responsible for most animal bites. Children are bitten more often than adults. What are the signs or symptoms? Common symptoms of an animal bite include:  Pain.  Bleeding.  Swelling.  Bruising.  How is this diagnosed? This condition may be diagnosed based on a physical exam and medical history. Your health care provider will examine the wound and ask for details about the animal and how the bite happened. You may also have tests, such as:  Blood tests to check for infection or to determine if surgery is needed.  X-rays to check for damage to bones or joints.  Culture test. This uses a sample of fluid from the wound to check for infection.  How is this treated? Treatment varies depending on the location and type of animal bite and your medical history. Treatment may include:  Wound care. This often includes cleaning the wound, flushing the wound with saline solution, and applying a bandage (dressing). Sometimes, the wound is left open to heal because of the high risk of infection. However, in some cases, the wound may be closed with stitches (sutures), staples, skin glue, or adhesive strips.  Antibiotic medicine.  Tetanus  shot.  Rabies treatment if the animal could have rabies.  In some cases, bites that have become infected may require IV antibiotics and surgical treatment in the hospital. Follow these instructions at home: Wound care  Follow instructions from your health care provider about how to take care of your wound. Make sure you: ? Wash your hands with soap and water before you change your dressing. If soap and water are not available, use hand sanitizer. ? Change your dressing as told by your health care provider. ? Leave sutures, skin glue, or adhesive strips in place. These skin closures may need to be in place for 2 weeks or longer. If adhesive strip edges start to loosen and curl up, you may trim the loose edges. Do not remove adhesive strips completely unless your health care provider tells you to do that.  Check your wound every day for signs of infection. Watch for: ? Increasing redness, swelling, or pain. ? Fluid, blood, or pus. General instructions  Take or apply over-the-counter and prescription medicines only as told by your health care provider.  If you were prescribed an antibiotic, take or apply it as told by your health care provider. Do not stop using the antibiotic even if your condition improves.  Keep the injured area raised (elevated) above the level of your heart while you are sitting or lying down, if this is possible.  If directed, apply ice to the injured area. ? Put ice in a plastic bag. ? Place  a towel between your skin and the bag. ? Leave the ice on for 20 minutes, 2-3 times per day.  Keep all follow-up visits as told by your health care provider. This is important. Contact a health care provider if:  You have increasing redness, swelling, or pain at the site of your wound.  You have a general feeling of sickness (malaise).  You feel nauseous or you vomit.  You have pain that does not get better. Get help right away if:  You have a red streak extending  away from your wound.  You have fluid, blood, or pus coming from your wound.  You have a fever or chills.  You have trouble moving your injured area.  You have numbness or tingling extending beyond the wound. This information is not intended to replace advice given to you by your health care provider. Make sure you discuss any questions you have with your health care provider. Document Released: 12/15/2010 Document Revised: 08/06/2015 Document Reviewed: 08/14/2014 Elsevier Interactive Patient Education  [employer].

## 2017-12-22 NOTE — Telephone Encounter (Signed)
Routing to PCP and Dr. Jacklynn Lewis just as an Christine Barton

## 2017-12-22 NOTE — Progress Notes (Signed)
Pre visit review using our clinic review tool, if applicable. No additional management support is needed unless otherwise documented below in the visit note. 

## 2017-12-22 NOTE — Telephone Encounter (Signed)
Called pt back Appointment was made for 12/23/17 instead of today. Lift VM to call back or go to urgent care for treatment.

## 2017-12-22 NOTE — Telephone Encounter (Signed)
Pt called after being bit by a cat. Cat is stray but pt states she practices catch and release. She believes cat has been vaccinated for rabies. The bite is a puncture on the rt thumb. Pt states it is becoming red. Care advice given per protocol. Pt verbalized understanding  Reason for Disposition . [1] Puncture wound (hole through the skin) AND [2] from a cat bite (or deep claw puncture wound)  Answer Assessment - Initial Assessment Questions 1. ANIMAL: "What type of animal caused the bite?" "Is the injury from a bite or a claw?" If the animal is a dog or a cat, ask: "Was it a pet or a stray?" "Was it acting ill or behaving strangely?"     Cat  Stray  2. LOCATION: "Where is the bite located?"      Thumb rt 3. SIZE: "How big is the bite?" "What does it look like?"      One fang that drew blood 4. ONSET: "When did the bite happen?" (Minutes or hours ago)      30 minutes 5. CIRCUMSTANCES: "Tell me how this happened."      Out of cage placing heating pads because cat is pregnant 6. TETANUS: "When was the last tetanus booster?"     unknow 7. PREGNANCY: "Is there any chance you are pregnant?" "When was your last menstrual period?"     N/A  Protocols used: ANIMAL BITE-A-AH

## 2017-12-22 NOTE — Progress Notes (Signed)
Chief Complaint  Patient presents with  . Animal Bite   F/u  Cat bite right thumb with 2 puncture wounds which are red and painful to right thumb. Bite was with a cat that she has rescued which is pregnant she picked the cat up and it bit her today. Cat had rabies vaccines 2 days ago. She is utd Tdap had 09/12/13    Review of Systems  Constitutional: Negative for fever.  Skin:       +cat bite right thumb   Past Medical History:  Diagnosis Date  . Alcohol abuse, unspecified   . Breast cancer ([place]) 1998   Right  . Cataract    left eye  . Cervical spondylosis 2006   MRI  . Degeneration of cervical intervertebral disc 2006   MRI  . Diverticulosis of colon (without mention of hemorrhage)   . Hyperpotassemia   . Microscopic hematuria   . Mononeuritis of unspecified site   . Nonspecific abnormal results of liver function study   . Other abnormal glucose   . Other and unspecified hyperlipidemia    no per pt  . Other chronic nonalcoholic liver disease   . Personal history of chemotherapy   . Personal history of malignant neoplasm of breast   . Personal history of radiation therapy   . Pneumothorax, acute    right, spontaneous  . Tobacco use disorder   . Unspecified vitamin D deficiency    Past Surgical History:  Procedure Laterality Date  . BREAST BIOPSY  9/03   Right  . BREAST LUMPECTOMY Right 1998  . BREAST LUMPECTOMY  1998  . CHEST TUBE INSERTION  11/02/2014  . COLONOSCOPY    . EYE SURGERY  02/2017   cataract extraction with lens implant  . TUBAL LIGATION     Family History  Problem Relation Age of Onset  . Heart failure Father   . Colon cancer Maternal Uncle   . Esophageal cancer Neg Hx   . Rectal cancer Neg Hx   . Stomach cancer Neg Hx    Social History   Socioeconomic History  . Marital status: Single    Spouse name: Not on file  . Number of children: 1  . Years of education: Not on file  . Highest education level: Not on file  Occupational History     Employer: [employer]  Social Needs  . Financial resource strain: Not on file  . Food insecurity:    Worry: Not on file    Inability: Not on file  . Transportation needs:    Medical: Not on file    Non-medical: Not on file  Tobacco Use  . Smoking status: Current Every Day Smoker    Packs/day: 1.00    Types: Cigarettes  . Smokeless tobacco: Never Used  Substance and Sexual Activity  . Alcohol use: Yes    Comment: occasional use  . Drug use: No  . Sexual activity: Not on file  Lifestyle  . Physical activity:    Days per week: Not on file    Minutes per session: Not on file  . Stress: Not on file  Relationships  . Social connections:    Talks on phone: Not on file    Gets together: Not on file    Attends religious service: Not on file    Active member of club or organization: Not on file    Attends meetings of clubs or organizations: Not on file    Relationship status: Not on  file  . Intimate partner violence:    Fear of current or ex partner: Not on file    Emotionally abused: Not on file    Physically abused: Not on file    Forced sexual activity: Not on file  Other Topics Concern  . Not on file  Social History Narrative   Divorced      1 child      Works at [employer]  . cyclobenzaprine (FLEXERIL) 10 MG tablet TAKE ONE TABLET BY MOUTH THREE TIMES DAILY AS NEEDED FOR MUSCLE SPASMS  . indomethacin (INDOCIN) 50 MG capsule TAKE ONE CAPSULE BY MOUTH THREE TIMES DAILY AS NEEDED FOR PAIN WITH FOOD  . Multiple Vitamin (MULTIVITAMIN WITH MINERALS) TABS tablet Take 1 tablet by mouth daily.  . mupirocin ointment (BACTROBAN) 2 % APPLY A SMALL AMOUNT TO AFFECTED AREA ONINSIDE OF NOSE 2 TIMES DAILY AS DIRECTED   Current Facility-Administered Medications for the 12/22/17 encounter (Office Visit) with McLean-Scocuzza, Nino Glow, MD  Medication  . 0.9 %  sodium chloride infusion   Allergies  Allergen Reactions  . Oxycodone  Nausea And Vomiting  . Glipizide Other (See Comments)    Stomach pain  . Metformin And Related Other (See Comments)    Stomach pain   Recent Results (from the past 2160 hour(s))  WOUND CULTURE     Status: None   Collection Time: 10/28/17  1:00 PM  Result Value Ref Range   MICRO NUMBER: [id number]    SPECIMEN QUALITY: ADEQUATE    SOURCE: WOUND (SITE NOT SPECIFIED)    STATUS: FINAL    GRAM STAIN:      No white blood cells seen No epithelial cells seen No organisms seen   ISOLATE 1: Pasteurella canis     Comment: Light growth of Pasteurella canis  Hemoglobin A1c     Status: None   Collection Time: 10/31/17  8:38 AM  Result Value Ref Range   Hgb A1c MFr Bld 6.2 4.6 - 6.5 %    Comment: Glycemic Control Guidelines for People with Diabetes:Non Diabetic:  <6%Goal of Therapy: <7%Additional Action Suggested:  >8%   VITAMIN D 25 Hydroxy (Vit-D Deficiency, Fractures)     Status: None   Collection Time: 10/31/17  8:38 AM  Result Value Ref Range   VITD 60.90 30.00 - 100.00 ng/mL  TSH     Status: None   Collection Time: 10/31/17  8:38 AM  Result Value Ref Range   TSH 3.69 0.35 - 4.50 uIU/mL  Lipid panel     Status: Abnormal   Collection Time: 10/31/17  8:38 AM  Result Value Ref Range   Cholesterol 133 0 - 200 mg/dL    Comment: ATP III Classification       Desirable:  < 200 mg/dL               Borderline High:  200 - 239 mg/dL          High:  > = 240 mg/dL   Triglycerides 261.0 (H) 0.0 - 149.0 mg/dL    Comment: Normal:  <150 mg/dLBorderline High:  150 - 199 mg/dL   HDL 25.30 (L) >39.00 mg/dL   VLDL 52.2 (H) 0.0 - 40.0 mg/dL   Total CHOL/HDL Ratio 5     Comment:                Men  Women1/2 Average Risk     3.4          3.3Average Risk          5.0          4.42X Average Risk          9.6          7.13X Average Risk          15.0          11.0                       NonHDL 107.64     Comment: NOTE:  Non-HDL goal should be 30 mg/dL higher than patient's LDL goal (i.e. LDL goal of < 70  mg/dL, would have non-HDL goal of < 100 mg/dL)  Comprehensive metabolic panel     Status: Abnormal   Collection Time: 10/31/17  8:38 AM  Result Value Ref Range   Sodium 138 135 - 145 mEq/L   Potassium 4.3 3.5 - 5.1 mEq/L   Chloride 99 96 - 112 mEq/L   CO2 30 19 - 32 mEq/L   Glucose, Bld 157 (H) 70 - 99 mg/dL   BUN 4 (L) 6 - 23 mg/dL   Creatinine, Ser 0.62 0.40 - 1.20 mg/dL   Total Bilirubin 1.0 0.2 - 1.2 mg/dL   Alkaline Phosphatase 204 (H) 39 - 117 U/L   AST 89 (H) 0 - 37 U/L   ALT 20 0 - 35 U/L   Total Protein 7.7 6.0 - 8.3 g/dL   Albumin 3.5 3.5 - 5.2 g/dL   Calcium 9.4 8.4 - 10.5 mg/dL   GFR 102.08 >60.00 mL/min  CBC with Differential/Platelet     Status: Abnormal   Collection Time: 10/31/17  8:38 AM  Result Value Ref Range   WBC 6.8 4.0 - 10.5 K/uL   RBC 3.97 3.87 - 5.11 Mil/uL   Hemoglobin 14.3 12.0 - 15.0 g/dL   HCT 41.4 36.0 - 46.0 %   MCV 104.3 (H) 78.0 - 100.0 fl   MCHC 34.6 30.0 - 36.0 g/dL   RDW 13.6 11.5 - 15.5 %   Platelets 193.0 150.0 - 400.0 K/uL   Neutrophils Relative % 68.0 43.0 - 77.0 %   Lymphocytes Relative 22.3 12.0 - 46.0 %   Monocytes Relative 7.6 3.0 - 12.0 %   Eosinophils Relative 1.7 0.0 - 5.0 %   Basophils Relative 0.4 0.0 - 3.0 %   Neutro Abs 4.6 1.4 - 7.7 K/uL   Lymphs Abs 1.5 0.7 - 4.0 K/uL   Monocytes Absolute 0.5 0.1 - 1.0 K/uL   Eosinophils Absolute 0.1 0.0 - 0.7 K/uL   Basophils Absolute 0.0 0.0 - 0.1 K/uL  Hepatitis C antibody     Status: None   Collection Time: 10/31/17  8:38 AM  Result Value Ref Range   Hepatitis C Ab NON-REACTIVE NON-REACTI   SIGNAL TO CUT-OFF 0.06 <1.00    Comment: . HCV antibody was non-reactive. There is no laboratory  evidence of HCV infection. . In most cases, no further action is required. However, if recent HCV exposure is suspected, a test for HCV RNA (test code [phone]) is suggested. . For additional information please refer to http://education.[facility].com/faq/FAQ22v1 (This link is being  provided for informational/ educational purposes only.) .   RPR     Status: None   Collection Time: 10/31/17  8:38 AM  Result Value Ref Range   RPR Ser Ql NON-REACTIVE NON-REACTI  HIV antibody     Status: None   Collection Time: 10/31/17  8:38 AM  Result Value Ref Range   HIV 1&2 Ab, 4th Generation NON-REACTIVE NON-REACTI    Comment: HIV-1 antigen and HIV-1/HIV-2 antibodies were not detected. There is no laboratory evidence of HIV infection. Marland Kitchen PLEASE NOTE: This information has been disclosed to you from records whose confidentiality may be protected by state law.  If your state requires such protection, then the state law prohibits you from making any further disclosure of the information without the specific written consent of the person to whom it pertains, or as otherwise permitted by law. A general authorization for the release of medical or other information is NOT sufficient for this purpose. . For additional information please refer to http://education.[facility].com/faq/FAQ106 (This link is being provided for informational/ educational purposes only.) . Marland Kitchen The performance of this assay has not been clinically validated in patients less than 31 years old. Marland Kitchen   HSV 1/2 Ab (IgM), IFA w/rflx Titer     Status: None   Collection Time: 10/31/17  8:38 AM  Result Value Ref Range   HSV 1 IgM Screen Negative Negative   HSV 2 IgM Screen Negative Negative    Comment: . The IFA procedure for measuring IgM antibodies to HSV 1 and HSV 2 detects both type-common and type-specific HSV antibodies. Thus, IgM reactivity to both HSV 1 and HSV 2 may represent crossreactive HSV antibodies rather than exposure to both HSV 1 and HSV 2. . This test was developed and its analytical performance characteristics have been determined by [employer], [place], [place]. It has not been cleared or approved by the FDA. This assay has been validated pursuant to the  CLIA regulations and is used for clinical purposes. Marland Kitchen   LDL cholesterol, direct     Status: None   Collection Time: 10/31/17  8:38 AM  Result Value Ref Range   Direct LDL 83.0 mg/dL    Comment: Optimal:  <100 mg/dLNear or Above Optimal:  100-129 mg/dLBorderline High:  130-159 mg/dLHigh:  160-189 mg/dLVery High:  >190 mg/dL  HSV(herpes simplex vrs) 1+2 ab-IgG     Status: Abnormal   Collection Time: 10/31/17 10:23 AM  Result Value Ref Range   HAV 1 IGG,TYPE SPECIFIC AB 37.00 (H) index   HSV 2 IGG,TYPE SPECIFIC AB 13.40 (H) index    Comment:                           Index          Interpretation                           -----          --------------                           <0.90          Negative                           0.90-1.09      Equivocal                           >1.09          Positive . This assay utilizes recombinant type-specific antigens to differentiate HSV-1  from HSV-2 infections. A positive result cannot distinguish between recent and past infection. If recent HSV infection is suspected but the results are negative or equivocal, the assay should be repeated in 4-6 weeks. The performance characteristics of the assay have not been established for pediatric populations, immunocompromised patients, or neonatal screening.    Objective  Body mass index is 19.8 kg/m. Wt Readings from Last 3 Encounters:  12/22/17 119 lb (54 kg)  11/08/17 119 lb 12 oz (54.3 kg)  11/07/17 117 lb 12 oz (53.4 kg)   Temp Readings from Last 3 Encounters:  12/22/17 98.5 F (36.9 C) (Oral)  11/08/17 98.6 F (37 C) (Oral)  11/07/17 98.2 F (36.8 C) (Oral)   BP Readings from Last 3 Encounters:  12/22/17 124/61  11/07/17 134/72  10/31/17 112/70   Pulse Readings from Last 3 Encounters:  12/22/17 (!) 114  11/08/17 (!) 108  11/07/17 (!) 117    Physical Exam  Constitutional: She is oriented to person, place, and time. Vital signs are normal. She appears well-developed and  well-nourished. She is cooperative.  HENT:  Head: Normocephalic and atraumatic.  Mouth/Throat: Oropharynx is clear and moist and mucous membranes are normal.  Eyes: Pupils are equal, round, and reactive to light. Conjunctivae are normal.  Cardiovascular: Normal rate, regular rhythm and normal heart sounds.  Pulmonary/Chest: Effort normal and breath sounds normal.  Neurological: She is alert and oriented to person, place, and time. Gait normal.  Skin: Skin is warm, dry and intact.  Psychiatric: She has a normal mood and affect. Her speech is normal and behavior is normal. Judgment and thought content normal. Cognition and memory are normal.  Nursing note and vitals reviewed.   Assessment   1. Cat bite right thumb Plan   1. augmentin bid x 10 days  Tdap had 09/12/13  otc topical abx and wound care with antibacterial soap    Provider: Dr. Olivia Mackie McLean-Scocuzza-Internal Medicine

## 2017-12-23 ENCOUNTER — Ambulatory Visit: Payer: Medicare PPO | Admitting: Family Medicine

## 2018-04-18 ENCOUNTER — Encounter: Payer: Self-pay | Admitting: Family Medicine

## 2018-04-18 ENCOUNTER — Ambulatory Visit (INDEPENDENT_AMBULATORY_CARE_PROVIDER_SITE_OTHER)
Admission: RE | Admit: 2018-04-18 | Discharge: 2018-04-18 | Disposition: A | Discharge status: Home / Self Care | Payer: Medicare HMO | Source: Ambulatory Visit | Attending: Family Medicine | Admitting: Family Medicine

## 2018-04-18 ENCOUNTER — Ambulatory Visit (INDEPENDENT_AMBULATORY_CARE_PROVIDER_SITE_OTHER): Payer: Medicare HMO | Admitting: Family Medicine

## 2018-04-18 VITALS — BP 140/62 | HR 111 | Temp 98.1°F | Ht 65.0 in | Wt 124.5 lb

## 2018-04-18 DIAGNOSIS — R748 Abnormal levels of other serum enzymes: Secondary | ICD-10-CM | POA: Diagnosis not present

## 2018-04-18 DIAGNOSIS — R1011 Right upper quadrant pain: Secondary | ICD-10-CM

## 2018-04-18 DIAGNOSIS — R042 Hemoptysis: Secondary | ICD-10-CM

## 2018-04-18 DIAGNOSIS — F17200 Nicotine dependence, unspecified, uncomplicated: Secondary | ICD-10-CM | POA: Diagnosis not present

## 2018-04-18 DIAGNOSIS — R109 Unspecified abdominal pain: Secondary | ICD-10-CM | POA: Insufficient documentation

## 2018-04-18 DIAGNOSIS — F1011 Alcohol abuse, in remission: Secondary | ICD-10-CM | POA: Diagnosis not present

## 2018-04-18 DIAGNOSIS — Z23 Encounter for immunization: Secondary | ICD-10-CM | POA: Diagnosis not present

## 2018-04-18 LAB — APTT: aPTT: 41.6 s — ABNORMAL HIGH (ref 23.4–32.7)

## 2018-04-18 LAB — BASIC METABOLIC PANEL
BUN: 6 mg/dL (ref 6–23)
CO2: 28 mEq/L (ref 19–32)
Calcium: 9.6 mg/dL (ref 8.4–10.5)
Chloride: 103 mEq/L (ref 96–112)
Creatinine, Ser: 0.65 mg/dL (ref 0.40–1.20)
GFR: 96.53 mL/min (ref >60.00)
Glucose, Bld: 136 mg/dL — ABNORMAL HIGH (ref 70–99)
Potassium: 4.6 mEq/L (ref 3.5–5.1)
Sodium: 136 mEq/L (ref 135–145)

## 2018-04-18 LAB — HEPATIC FUNCTION PANEL
ALT: 15 U/L (ref 0–35)
AST: 75 U/L — ABNORMAL HIGH (ref 0–37)
Albumin: 3.2 g/dL — ABNORMAL LOW (ref 3.5–5.2)
Alkaline Phosphatase: 158 U/L — ABNORMAL HIGH (ref 39–117)
Bilirubin, Direct: 0.7 mg/dL — ABNORMAL HIGH (ref 0.0–0.3)
Total Bilirubin: 1.6 mg/dL — ABNORMAL HIGH (ref 0.2–1.2)
Total Protein: 6.8 g/dL (ref 6.0–8.3)

## 2018-04-18 LAB — PROTIME-INR
INR: 1.3 ratio — ABNORMAL HIGH (ref 0.8–1.0)
Prothrombin Time: 15.3 s — ABNORMAL HIGH (ref 9.6–13.1)

## 2018-04-18 LAB — LIPASE: Lipase: 25 U/L (ref 11.0–59.0)

## 2018-04-18 NOTE — Assessment & Plan Note (Signed)
RUQ In setting of alcohol abuse history and likely cirrhosis  Lab today incl LFT and lipase  Is abstaining from etoh and enc to continue

## 2018-04-18 NOTE — Assessment & Plan Note (Signed)
New in the past week with smoker's cough  No pain or uri symptoms  cxr today  Cbc Plan to follow  Again -long discussion re: smoking cessation

## 2018-04-18 NOTE — Assessment & Plan Note (Signed)
Due to heavy etoh use Claims none since early Dec-commended  Lab today  Some RUQ abd pain and bloating

## 2018-04-18 NOTE — Assessment & Plan Note (Signed)
Disc in detail risks of smoking and possible outcomes including copd, vascular/ heart disease, cancer , respiratory and sinus infections  Pt voices understanding  Now having hemoptysis  Aware of her risk of lung cancer  cxr today

## 2018-04-18 NOTE — Assessment & Plan Note (Signed)
Per pt -quit since early December and improved  Having abd pain  Hepatomegaly on exam  H/o elevated LFTs  At risk for varices   Lab today incl cbc/lft/lipase

## 2018-04-18 NOTE — Patient Instructions (Signed)
For now-minimize pain medication  Continue to abstain from alcohol - keep up the good work   Keep thinking about quitting smoking  Chest xray today  Also labs  We will update you with results and make a plan

## 2018-04-18 NOTE — Progress Notes (Signed)
Subjective:    Patient ID: Christine Barton, female    DOB: 23-May-1950, 68 y.o.   MRN: [id number]  HPI  Here for problem of coughing up blood  Started about a week ago  Not sick  A fair amount one time  Bright red blood   Smoking is on and off --1ppd No vomiting   No fever  No green or yellow phlegm  No sob  No wheezing  Pulse ox is 99%  Has had some stomach symptoms It seems swollen  Lab Results  Component Value Date   ALT 20 10/31/2017   AST 89 (H) 10/31/2017   ALKPHOS 204 (H) 10/31/2017   BILITOT 1.0 10/31/2017    Pain in RUQ of abdomen   She has not had alcohol since early December  Doing well  No withdrawal   Patient Active Problem List   Diagnosis Date Noted  . Hemoptysis 04/18/2018  . Abdominal pain 04/18/2018  . Cat bite of ankle 11/07/2017  . Screening mammogram, encounter for 11/07/2017  . Smoker 11/07/2017  . Screening examination for STD (sexually transmitted disease) 11/11/2016  . Welcome to Medicare preventive visit 10/26/2016  . Estrogen deficiency 10/26/2016  . Colon cancer screening 11/01/2014  . Elevated liver enzymes 11/01/2014  . Encounter for routine gynecological examination 09/12/2013  . Rapid heart beat 10/10/2012  . Heartburn 08/20/2011  . Routine general medical examination at a health care facility 07/21/2011  . Vitamin D deficiency 08/04/2009  . POSTMENOPAUSAL STATUS 08/04/2009  . Prediabetes 02/18/2009  . Hyperlipidemia 06/14/2008  . [place] DISEASE, CERVICAL 03/30/2007  . History of alcohol abuse 12/06/2006  . NEUROPATHY 12/06/2006  . DIVERTICULOSIS, COLON 12/06/2006  . Fatty liver 12/06/2006  . BREAST CANCER, HX OF 12/06/2006   Past Medical History:  Diagnosis Date  . Alcohol abuse, unspecified   . Breast cancer ([place]) 1998   Right  . Cataract    left eye  . Cervical spondylosis 2006   MRI  . Degeneration of cervical intervertebral disc 2006   MRI  . Diverticulosis of colon (without mention of hemorrhage)   .  Hyperpotassemia   . Microscopic hematuria   . Mononeuritis of unspecified site   . Nonspecific abnormal results of liver function study   . Other abnormal glucose   . Other and unspecified hyperlipidemia    no per pt  . Other chronic nonalcoholic liver disease   . Personal history of chemotherapy   . Personal history of malignant neoplasm of breast   . Personal history of radiation therapy   . Pneumothorax, acute    right, spontaneous  . Tobacco use disorder   . Unspecified vitamin D deficiency    Past Surgical History:  Procedure Laterality Date  . BREAST BIOPSY  9/03   Right  . BREAST LUMPECTOMY Right 1998  . BREAST LUMPECTOMY  1998  . CHEST TUBE INSERTION  11/02/2014  . COLONOSCOPY    . EYE SURGERY  02/2017   cataract extraction with lens implant  . TUBAL LIGATION     Social History   Tobacco Use  . Smoking status: Current Every Day Smoker    Packs/day: 1.00    Types: Cigarettes  . Smokeless tobacco: Never Used  Substance Use Topics  . Alcohol use: Yes    Comment: occasional use  . Drug use: No   Family History  Problem Relation Age of Onset  . Heart failure Father   . Colon cancer Maternal Uncle   . Esophageal cancer  Neg Hx   . Rectal cancer Neg Hx   . Stomach cancer Neg Hx    Allergies  Allergen Reactions  . Oxycodone Nausea And Vomiting  . Glipizide Other (See Comments)    Stomach pain  . Metformin And Related Other (See Comments)    Stomach pain   Current Outpatient Medications on File Prior to Visit  Medication Sig Dispense Refill  . cyclobenzaprine (FLEXERIL) 10 MG tablet TAKE ONE TABLET BY MOUTH THREE TIMES DAILY AS NEEDED FOR MUSCLE SPASMS 90 tablet 0  . indomethacin (INDOCIN) 50 MG capsule TAKE ONE CAPSULE BY MOUTH THREE TIMES DAILY AS NEEDED FOR PAIN WITH FOOD 90 capsule 2  . Multiple Vitamin (MULTIVITAMIN WITH MINERALS) TABS tablet Take 1 tablet by mouth daily.    . mupirocin ointment (BACTROBAN) 2 % APPLY A SMALL AMOUNT TO AFFECTED AREA  ONINSIDE OF NOSE 2 TIMES DAILY AS DIRECTED 30 g 0   No current facility-administered medications on file prior to visit.      Review of Systems  Constitutional: Positive for fatigue. Negative for activity change, appetite change, fever and unexpected weight change.  HENT: Negative for congestion, ear pain, rhinorrhea, sinus pressure and sore throat.   Eyes: Negative for pain, redness and visual disturbance.  Respiratory: Positive for cough. Negative for chest tightness, shortness of breath, wheezing and stridor.   Cardiovascular: Negative for chest pain, palpitations and leg swelling.  Gastrointestinal: Positive for abdominal distention and abdominal pain. Negative for anal bleeding, blood in stool, constipation, diarrhea, nausea, rectal pain and vomiting.  Endocrine: Negative for polydipsia and polyuria.  Genitourinary: Negative for dysuria, frequency and urgency.  Musculoskeletal: Positive for arthralgias and back pain. Negative for myalgias.  Skin: Negative for pallor and rash.  Allergic/Immunologic: Negative for environmental allergies.  Neurological: Positive for tremors. Negative for dizziness, seizures, syncope, facial asymmetry, speech difficulty, weakness, light-headedness, numbness and headaches.  Hematological: Negative for adenopathy. Does not bruise/bleed easily.  Psychiatric/Behavioral: Negative for decreased concentration and dysphoric mood. The patient is not nervous/anxious.        Objective:   Physical Exam Constitutional:      General: She is not in acute distress.    Appearance: Normal appearance. She is well-developed and normal weight. She is not ill-appearing.     Comments: Frail appearing   HENT:     Head: Normocephalic and atraumatic.     Nose: Nose normal.     Mouth/Throat:     Mouth: Mucous membranes are moist.     Pharynx: Oropharynx is clear. No posterior oropharyngeal erythema.  Eyes:     General: No scleral icterus.       Right eye: No discharge.         Left eye: No discharge.     Conjunctiva/sclera: Conjunctivae normal.     Pupils: Pupils are equal, round, and reactive to light.  Neck:     Musculoskeletal: Normal range of motion and neck supple. No neck rigidity.     Vascular: No carotid bruit.  Cardiovascular:     Rate and Rhythm: Regular rhythm. Tachycardia present.     Pulses: Normal pulses.     Heart sounds: Normal heart sounds.  Pulmonary:     Effort: Pulmonary effort is normal. No respiratory distress.     Breath sounds: Normal breath sounds. No wheezing or rales.     Comments: Diffusely distant bs Harsh bs  No wheezing today  No rales or rhonchi Abdominal:     General: Bowel sounds are normal.  There is no distension or abdominal bruit.     Palpations: Abdomen is soft. There is hepatomegaly. There is no splenomegaly, mass or pulsatile mass.     Tenderness: There is abdominal tenderness in the right upper quadrant and epigastric area. There is no right CVA tenderness, left CVA tenderness, guarding or rebound. Positive signs include Murphy's sign. Negative signs include McBurney's sign.     Hernia: No hernia is present.     Comments: Protuberant abdomen   Musculoskeletal:        General: No swelling or tenderness.     Right lower leg: No edema.     Left lower leg: No edema.  Lymphadenopathy:     Cervical: No cervical adenopathy.  Skin:    General: Skin is warm and dry.     Capillary Refill: Capillary refill takes less than 2 seconds.     Coloration: Skin is not jaundiced or pale.     Findings: No erythema.     Comments: telengectasias on face with rosacea  Neurological:     Mental Status: She is alert.     Motor: Tremor present. No abnormal muscle tone.     Coordination: Coordination normal.     Deep Tendon Reflexes: Reflexes normal.  Psychiatric:        Mood and Affect: Mood is anxious.        Behavior: Behavior is not agitated.     Comments: Mildly anxious Pleasant            Assessment & Plan:    Problem List Items Addressed This Visit      Other   Smoker    Disc in detail risks of smoking and possible outcomes including copd, vascular/ heart disease, cancer , respiratory and sinus infections  Pt voices understanding  Now having hemoptysis  Aware of her risk of lung cancer  cxr today      Relevant Orders   DG Chest 2 View   CBC with Differential/Platelet   History of alcohol abuse    Per pt -quit since early December and improved  Having abd pain  Hepatomegaly on exam  H/o elevated LFTs  At risk for varices   Lab today incl cbc/lft/lipase       Hemoptysis - Primary    New in the past week with smoker's cough  No pain or uri symptoms  cxr today  Cbc Plan to follow  Again -long discussion re: smoking cessation       Relevant Orders   DG Chest 2 View   CBC with Differential/Platelet   Elevated liver enzymes    Due to heavy etoh use Claims none since early Dec-commended  Lab today  Some RUQ abd pain and bloating       Relevant Orders   Lipase (Completed)   Hepatic function panel (Completed)   APTT (Completed)   Protime-INR (Completed)   Abdominal pain    RUQ In setting of alcohol abuse history and likely cirrhosis  Lab today incl LFT and lipase  Is abstaining from etoh and enc to continue       Relevant Orders   Basic metabolic panel (Completed)   Lipase (Completed)   Hepatic function panel (Completed)   CBC with Differential/Platelet    Other Visit Diagnoses    Need for influenza vaccination       Relevant Orders   Flu Vaccine QUAD 6+ mos PF IM (Fluarix Quad PF) (Completed)

## 2018-04-19 ENCOUNTER — Other Ambulatory Visit (INDEPENDENT_AMBULATORY_CARE_PROVIDER_SITE_OTHER): Payer: Medicare HMO

## 2018-04-19 DIAGNOSIS — F17200 Nicotine dependence, unspecified, uncomplicated: Secondary | ICD-10-CM | POA: Diagnosis not present

## 2018-04-19 DIAGNOSIS — R1011 Right upper quadrant pain: Secondary | ICD-10-CM

## 2018-04-19 DIAGNOSIS — R042 Hemoptysis: Secondary | ICD-10-CM | POA: Diagnosis not present

## 2018-04-19 LAB — CBC WITH DIFFERENTIAL/PLATELET
Basophils Absolute: 0 10*3/uL (ref 0.0–0.1)
Basophils Relative: 0.5 % (ref 0.0–3.0)
Eosinophils Absolute: 0.2 10*3/uL (ref 0.0–0.7)
Eosinophils Relative: 1.8 % (ref 0.0–5.0)
HCT: 37.4 % (ref 36.0–46.0)
Hemoglobin: 12.5 g/dL (ref 12.0–15.0)
Lymphocytes Relative: 24.9 % (ref 12.0–46.0)
Lymphs Abs: 2.3 10*3/uL (ref 0.7–4.0)
MCHC: 33.4 g/dL (ref 30.0–36.0)
MCV: 102.4 fl — ABNORMAL HIGH (ref 78.0–100.0)
Monocytes Absolute: 0.9 10*3/uL (ref 0.1–1.0)
Monocytes Relative: 10.3 % (ref 3.0–12.0)
Neutro Abs: 5.7 10*3/uL (ref 1.4–7.7)
Neutrophils Relative %: 62.5 % (ref 43.0–77.0)
Platelets: 178.2 10*3/uL (ref 150.0–400.0)
RBC: 3.66 Mil/uL — ABNORMAL LOW (ref 3.87–5.11)
RDW: 14.3 % (ref 11.5–15.5)
WBC: 9.2 10*3/uL (ref 4.0–10.5)

## 2018-04-20 ENCOUNTER — Telehealth: Payer: Self-pay | Admitting: Family Medicine

## 2018-04-20 DIAGNOSIS — R748 Abnormal levels of other serum enzymes: Secondary | ICD-10-CM

## 2018-04-20 DIAGNOSIS — F1011 Alcohol abuse, in remission: Secondary | ICD-10-CM

## 2018-04-20 DIAGNOSIS — R1011 Right upper quadrant pain: Secondary | ICD-10-CM

## 2018-04-20 NOTE — Telephone Encounter (Signed)
-----   Message from Tammi Sou, [place] sent at 04/20/2018 12:28 PM EST ----- Pt notified of Dr. Marliss Coots comments on labs, pt does agree with [place] because she said she is in a lot of pain, pt will stop by office to pick up stool cards today

## 2018-04-21 ENCOUNTER — Other Ambulatory Visit: Payer: Self-pay | Admitting: *Deleted

## 2018-04-21 MED ORDER — CYCLOBENZAPRINE HCL 10 MG PO TABS: ORAL_TABLET | ORAL | 0 refills | Status: DC

## 2018-04-21 NOTE — Telephone Encounter (Signed)
Last filled on 10/31/17 #90 tabs with 0 refills, please advise   [facility]

## 2018-04-21 NOTE — Telephone Encounter (Signed)
Abdominal US scheduled for 04/24/2018 and patient is aware.

## 2018-04-21 NOTE — Telephone Encounter (Signed)
Patient called requesting a call back from the person that gave her the stools cards. Patient stated that she has questions about the test.

## 2018-04-21 NOTE — Telephone Encounter (Signed)
Answered pt's question regarding stool cards.

## 2018-04-24 ENCOUNTER — Ambulatory Visit
Admission: RE | Admit: 2018-04-24 | Discharge: 2018-04-24 | Disposition: A | Discharge status: Home / Self Care | Payer: Medicare HMO | Source: Ambulatory Visit | Attending: Family Medicine | Admitting: Family Medicine

## 2018-04-24 ENCOUNTER — Encounter: Payer: Self-pay | Admitting: Family Medicine

## 2018-04-24 ENCOUNTER — Other Ambulatory Visit (INDEPENDENT_AMBULATORY_CARE_PROVIDER_SITE_OTHER): Payer: Medicare HMO

## 2018-04-24 ENCOUNTER — Telehealth: Payer: Self-pay | Admitting: Family Medicine

## 2018-04-24 DIAGNOSIS — K7460 Unspecified cirrhosis of liver: Secondary | ICD-10-CM | POA: Insufficient documentation

## 2018-04-24 DIAGNOSIS — K7031 Alcoholic cirrhosis of liver with ascites: Secondary | ICD-10-CM

## 2018-04-24 DIAGNOSIS — E119 Type 2 diabetes mellitus without complications: Secondary | ICD-10-CM

## 2018-04-24 DIAGNOSIS — R748 Abnormal levels of other serum enzymes: Secondary | ICD-10-CM

## 2018-04-24 DIAGNOSIS — F1011 Alcohol abuse, in remission: Secondary | ICD-10-CM

## 2018-04-24 DIAGNOSIS — Z0000 Encounter for general adult medical examination without abnormal findings: Secondary | ICD-10-CM | POA: Diagnosis not present

## 2018-04-24 DIAGNOSIS — Z113 Encounter for screening for infections with a predominantly sexual mode of transmission: Secondary | ICD-10-CM | POA: Diagnosis not present

## 2018-04-24 DIAGNOSIS — R1011 Right upper quadrant pain: Secondary | ICD-10-CM

## 2018-04-24 DIAGNOSIS — R195 Other fecal abnormalities: Secondary | ICD-10-CM | POA: Insufficient documentation

## 2018-04-24 DIAGNOSIS — K8020 Calculus of gallbladder without cholecystitis without obstruction: Secondary | ICD-10-CM

## 2018-04-24 DIAGNOSIS — R188 Other ascites: Secondary | ICD-10-CM | POA: Insufficient documentation

## 2018-04-24 DIAGNOSIS — Z1211 Encounter for screening for malignant neoplasm of colon: Secondary | ICD-10-CM

## 2018-04-24 DIAGNOSIS — K760 Fatty (change of) liver, not elsewhere classified: Secondary | ICD-10-CM

## 2018-04-24 LAB — MICROALBUMIN / CREATININE URINE RATIO
Creatinine,U: 76.6 mg/dL
Microalb Creat Ratio: 0.9 mg/g (ref 0.0–30.0)
Microalb, Ur: <0.7 mg/dL (ref 0.0–1.9)

## 2018-04-24 LAB — HEMOCCULT SLIDES (X 3 CARDS)
Fecal Occult Blood: NEGATIVE
OCCULT 1: POSITIVE — AB
OCCULT 2: POSITIVE — AB
OCCULT 3: NEGATIVE
OCCULT 4: NEGATIVE
OCCULT 5: NEGATIVE

## 2018-04-24 NOTE — Telephone Encounter (Signed)
-----   Message from Tammi Sou, [place] sent at 04/24/2018  4:59 PM EST ----- Pt notified of heme cards and [place] results and Dr. Marliss Coots comments. Pt does agree with referrals to GI and surgeon, please put referrals in and I advise pt our [facility] will call to schedule appts

## 2018-04-24 NOTE — Telephone Encounter (Signed)
Both referrals done Will route to [facility]

## 2018-04-25 LAB — C. TRACHOMATIS/N. GONORRHOEAE RNA
C. trachomatis RNA, TMA: NOT DETECTED
N. gonorrhoeae RNA, TMA: NOT DETECTED

## 2018-04-25 NOTE — Telephone Encounter (Signed)
That is ok for the constipation  Also try to increase water intake miralax otc is also helpful

## 2018-04-25 NOTE — Telephone Encounter (Signed)
Pt stated she has more questions and would like a call back from nurse. Please call pt

## 2018-04-25 NOTE — Telephone Encounter (Signed)
I spoke with pt; pt wants to know if the doctor she is seeing on 04/27/18 will also do the GB surgery. I advised pt that the appt on 04/27/18 is with [facility] to ck elevated liver enzymes and blood on heme cards. Then [place] surgery should be the one contacting pt about the GB surgery. Pt wants to know if they will be done on same day. Request cb from [facility] to verify information for pt.   For 2 weeks pt having problems with constipation. Pt has been taking Senokot two tabs at hs each night and some days takes a suppository in the AM if no BM from the Senokot. Pt is not hurting and has not seen any blood on tissue when wiping or in commode. Pt wants to know if OK to continue this until sees GI doctor. Please advise to Dr Glori Bickers. Pt request cb.

## 2018-04-26 NOTE — Telephone Encounter (Signed)
Called patient and she is clear where she is going for both appointments and aware that [facility] will be calling her to set up the Gen Surgery Appt., GI Appt has already been scheduled.

## 2018-04-27 ENCOUNTER — Other Ambulatory Visit: Payer: Self-pay | Admitting: *Deleted

## 2018-04-27 ENCOUNTER — Other Ambulatory Visit (INDEPENDENT_AMBULATORY_CARE_PROVIDER_SITE_OTHER): Payer: Medicare HMO

## 2018-04-27 ENCOUNTER — Encounter: Payer: Self-pay | Admitting: Physician Assistant

## 2018-04-27 ENCOUNTER — Ambulatory Visit: Payer: Medicare HMO | Admitting: Physician Assistant

## 2018-04-27 VITALS — BP 100/60 | HR 76 | Ht 65.0 in | Wt 130.0 lb

## 2018-04-27 DIAGNOSIS — K8020 Calculus of gallbladder without cholecystitis without obstruction: Secondary | ICD-10-CM

## 2018-04-27 DIAGNOSIS — R1011 Right upper quadrant pain: Secondary | ICD-10-CM | POA: Diagnosis not present

## 2018-04-27 DIAGNOSIS — R140 Abdominal distension (gaseous): Secondary | ICD-10-CM

## 2018-04-27 DIAGNOSIS — K5900 Constipation, unspecified: Secondary | ICD-10-CM

## 2018-04-27 DIAGNOSIS — K7460 Unspecified cirrhosis of liver: Secondary | ICD-10-CM | POA: Diagnosis not present

## 2018-04-27 DIAGNOSIS — K8080 Other cholelithiasis without obstruction: Secondary | ICD-10-CM | POA: Diagnosis not present

## 2018-04-27 DIAGNOSIS — R195 Other fecal abnormalities: Secondary | ICD-10-CM

## 2018-04-27 LAB — IBC PANEL
Iron: 72 ug/dL (ref 42–145)
Saturation Ratios: 26.5 % (ref 20.0–50.0)
Transferrin: 194 mg/dL — ABNORMAL LOW (ref 212.0–360.0)

## 2018-04-27 LAB — FERRITIN: Ferritin: 60.3 ng/mL (ref 10.0–291.0)

## 2018-04-27 MED ORDER — SPIRONOLACTONE 50 MG PO TABS: ORAL_TABLET | ORAL | 0 refills | Status: DC

## 2018-04-27 MED ORDER — FUROSEMIDE 20 MG PO TABS: 20.0000 mg | ORAL_TABLET | Freq: Every day | ORAL | 0 refills | Status: DC

## 2018-04-27 NOTE — Progress Notes (Signed)
Reviewed and agree with documentation and assessment and plan. K. Veena Nandigam , MD   

## 2018-04-27 NOTE — Patient Instructions (Addendum)
Call [facility] Surgery at [phone]. Your provider has requested that you go to the basement level for lab work before leaving today. Press "B" on the elevator. The lab is located at the first door on the left as you exit the elevator.  Come to our lab also on Thursday 05-04-18 for a few more labs. We also made you an appointment with Ellouise Newer PA for same day at 10: 15.  Come to our lab at 9:30 am. Then come to the third floor to see [place].     We sent prescriptions to [place], [place], [place].  Take Lasix 20 mg every day.  Take Spironalacftone 50 mg daily   Start Miralax- 17 grams in a glass of water twice daily. Stop the Senokot.  Take daily weights and write them down.

## 2018-04-27 NOTE — Progress Notes (Signed)
Chief Complaint: Positive Hemoccult, elevated LFTs, hemoptysis, cholelithiasis with right upper quadrant pain  HPI:    Christine Barton is a 68 year old female with a past medical history as listed below, known to Dr. [name], who was referred to me by [name], MD for a complaint of Hemoccult positive stool and elevated LFTs with a cough.      11/30/2016 colonoscopy Dr. [name] with 1 6 mm polyp in the ascending colon, 4 1-2 mm polyps in the rectum and ascending colon, moderate diverticulosis in the sigmoid, descending, transverse and ascending colon, narrowing of the colon in association with diverticular opening and evidence of diverticular spasm, nonbleeding internal hemorrhoids and otherwise normal.  Pathology revealed mixture of tubular adenomas and hyperplastic polyps.  Repeat recommended in 3 years.    04/18/2018 office visit PCP to discuss coughing up blood.  Describes smoking a pack per day on and off.  History of alcohol use.  Pain in the right upper quadrant.    04/24/2018 Hemoccult cards are positive x2/5.  LFTs 04/18/2018 with a total bilirubin elevated at 1.6 (1 5 months ago), direct bilirubin elevated at 0.7, alk phos elevated at 158 (204 5 months ago), AST 75 (89 5 months ago), Albumin decreased at 3.2 (3.55 months ago).  PT/INR elevated, INR 1.3, PT 15.3.    04/24/2018 right upper quadrant ultrasound with changes of cirrhosis with mild to moderate ascites, cholelithiasis without complicating factors.    Today, the patient is a poor historian and there are a lot of things going on with her.  At first we discussed that she has been constipated for the past 2 weeks.  She is started taking 3-4 Senokot a day which is helping some but she still occasionally has to use a suppository.  This is new for her.  Patient also tells me she recently had Hemoccult studies which were positive.  She has not seen any bright red blood in her stool but does tell me that she has been coughing blood on occasion  and her PCP is working this up.  Tells me she did stop drinking 2 weeks before Christmas, prior to this she would have 2-3 drinks of liquor for years.  Still smokes.    Also discusses right upper quadrant pain today.  Did have an ultrasound as above and is supposed to follow-up with [facility] in regards to cholelithiasis.  Patient tells me they have not called her with an appointment yet and this is a 8-9/10 pain which is there most of the day.    Patient also describes elevated liver enzymes, apparently these have been "increasing recently" per the patient.  Patient was told is likely due to her liver disease but has never been truly been told she has cirrhosis, only that was suspected from her alcohol use.  Does have some increased abdominal distention, "I look like I am 9 months pregnant" over the past month or so.    Denies fever, chills, weight loss, anorexia, nausea or vomiting.           Past Medical History:  Diagnosis Date  . Alcohol abuse, unspecified   . Breast cancer ([place]) 1998   Right  . Cataract    left eye  . Cervical spondylosis 2006   MRI  . Degeneration of cervical intervertebral disc 2006   MRI  . Diverticulosis of colon (without mention of hemorrhage)   . Hyperpotassemia   . Microscopic hematuria   . Mononeuritis of unspecified site   .  Nonspecific abnormal results of liver function study   . Other abnormal glucose   . Other and unspecified hyperlipidemia    no per pt  . Other chronic nonalcoholic liver disease   . Personal history of chemotherapy   . Personal history of malignant neoplasm of breast   . Personal history of radiation therapy   . Pneumothorax, acute    right, spontaneous  . Tobacco use disorder   . Unspecified vitamin D deficiency     Past Surgical History:  Procedure Laterality Date  . BREAST BIOPSY  9/03   Right  . BREAST LUMPECTOMY Right 1998  . BREAST LUMPECTOMY  1998  . CHEST TUBE INSERTION  11/02/2014  . COLONOSCOPY    . EYE SURGERY   02/2017   cataract extraction with lens implant  . TUBAL LIGATION      Current Outpatient Medications  Medication Sig Dispense Refill  . cyclobenzaprine (FLEXERIL) 10 MG tablet TAKE ONE TABLET BY MOUTH THREE TIMES DAILY AS NEEDED FOR MUSCLE SPASMS 90 tablet 0  . indomethacin (INDOCIN) 50 MG capsule TAKE ONE CAPSULE BY MOUTH THREE TIMES DAILY AS NEEDED FOR PAIN WITH FOOD 90 capsule 2  . Multiple Vitamin (MULTIVITAMIN WITH MINERALS) TABS tablet Take 1 tablet by mouth daily.    . mupirocin ointment (BACTROBAN) 2 % APPLY A SMALL AMOUNT TO AFFECTED AREA ONINSIDE OF NOSE 2 TIMES DAILY AS DIRECTED 30 g 0   No current facility-administered medications for this visit.     Allergies as of 04/27/2018 - Review Complete 04/27/2018  Allergen Reaction Noted  . Oxycodone Nausea And Vomiting 11/02/2014  . Glipizide Other (See Comments) 09/12/2013  . Metformin and related Other (See Comments) 02/02/2013    Family History  Problem Relation Age of Onset  . Heart failure Father   . Colon cancer Maternal Uncle   . Stroke Mother   . Esophageal cancer Neg Hx   . Rectal cancer Neg Hx   . Stomach cancer Neg Hx     Social History   Socioeconomic History  . Marital status: Single    Spouse name: Not on file  . Number of children: 1  . Years of education: Not on file  . Highest education level: Not on file  Occupational History  . Occupation: retired    Fish farm manager: [employer]  Social Needs  . Financial resource strain: Not on file  . Food insecurity:    Worry: Not on file    Inability: Not on file  . Transportation needs:    Medical: Not on file    Non-medical: Not on file  Tobacco Use  . Smoking status: Current Every Day Smoker    Packs/day: 1.00    Years: 53.00    Pack years: 53.00    Types: Cigarettes  . Smokeless tobacco: Never Used  . Tobacco comment: tobacco info given 04/27/2018  Substance and Sexual Activity  . Alcohol use: Not Currently    Comment: occasional use  . Drug  use: No  . Sexual activity: Not on file  Lifestyle  . Physical activity:    Days per week: Not on file    Minutes per session: Not on file  . Stress: Not on file  Relationships  . Social connections:    Talks on phone: Not on file    Gets together: Not on file    Attends religious service: Not on file    Active member of club or organization: Not on file    Attends  meetings of clubs or organizations: Not on file    Relationship status: Not on file  . Intimate partner violence:    Fear of current or ex partner: Not on file    Emotionally abused: Not on file    Physically abused: Not on file    Forced sexual activity: Not on file  Other Topics Concern  . Not on file  Social History Narrative   Divorced      1 child      Works at [employer]:    Constitutional: No weight loss, fever, chills, weakness or fatigue HEENT: Eyes: No change in vision               Ears, Nose, Throat:  No change in hearing or congestion Skin: No rash or itching Cardiovascular: No chest pain, chest pressure or palpitations   Respiratory: No SOB or cough Gastrointestinal: See HPI and otherwise negative Genitourinary: No dysuria or change in urinary frequency Neurological: No headache, dizziness or syncope Musculoskeletal: No new muscle or joint pain Hematologic: No bleeding or bruising Psychiatric: No history of depression or anxiety    Physical Exam:  Vital signs: There were no vitals taken for this visit.  Constitutional:   Pleasant Caucasian female appears to be in NAD, Well developed, Well nourished, alert and cooperative Head:  Normocephalic and atraumatic. Eyes:   PEERL, EOMI. No icterus. Conjunctiva pink. Ears:  Normal auditory acuity. Neck:  Supple Throat: Oral cavity and pharynx without inflammation, swelling or lesion.  Respiratory: Respirations even and unlabored. Lungs clear to auscultation bilaterally.   No wheezes, crackles, or rhonchi.    Cardiovascular: Normal S1, S2. No MRG. Regular rate and rhythm. No peripheral edema, cyanosis or pallor.  Gastrointestinal:  Soft, nondistended, nontender. No rebound or guarding. Normal bowel sounds. No appreciable masses or hepatomegaly. Rectal:  Not performed.  Msk:  Symmetrical without gross deformities. Without edema, no deformity or joint abnormality.  Neurologic:  Alert and  oriented x4;  grossly normal neurologically.  Skin:   Dry and intact without significant lesions or rashes. Psychiatric: Oriented to person, place and time. Demonstrates good judgement and reason without abnormal affect or behaviors.  RELEVANT LABS AND IMAGING: CBC    Component Value Date/Time   WBC 9.2 04/19/2018 1105   RBC 3.66 (L) 04/19/2018 1105   HGB 12.5 04/19/2018 1105   HCT 37.4 04/19/2018 1105   PLT 178.2 04/19/2018 1105   MCV 102.4 (H) 04/19/2018 1105   MCH 35.6 (H) 11/02/2014 1521   MCHC 33.4 04/19/2018 1105   RDW 14.3 04/19/2018 1105   LYMPHSABS 2.3 04/19/2018 1105   MONOABS 0.9 04/19/2018 1105   EOSABS 0.2 04/19/2018 1105   BASOSABS 0.0 04/19/2018 1105    CMP     Component Value Date/Time   NA 136 04/18/2018 1437   K 4.6 04/18/2018 1437   CL 103 04/18/2018 1437   CO2 28 04/18/2018 1437   GLUCOSE 136 (H) 04/18/2018 1437   BUN 6 04/18/2018 1437   CREATININE 0.65 04/18/2018 1437   CALCIUM 9.6 04/18/2018 1437   PROT 6.8 04/18/2018 1437   ALBUMIN 3.2 (L) 04/18/2018 1437   AST 75 (H) 04/18/2018 1437   ALT 15 04/18/2018 1437   ALKPHOS 158 (H) 04/18/2018 1437   BILITOT 1.6 (H) 04/18/2018 1437   GFRNONAA >60 11/02/2014 1521   GFRAA >60 11/02/2014 1521    Assessment: 1.  Cirrhosis: with abdominal distension, Newly  diagnosed via ultrasound with some elevation in LFTs, significant history of alcohol abuse, likely the cause but will order other labs today 2.  Constipation: New over the past 2 weeks, likely related to the fact the patient is stopped drinking 3.  Hemoccult positive stool:  With PCP, does have history of hemoptysis, this could be the cause vs hemorrhoids on internal exam today 4.  Right upper quadrant pain: With recent ultrasound showing cholelithiasis, likely symptomatic 5.  Cholelithiasis  Plan: 1.  Patient has many problems, due to this we will follow with her shortly in order to make sure that she understands everything that is going on with her cirrhosis. 2.  Ordered further liver serologies today to rule out other causes including ANA, ASMA, AMA, alpha-1 antitrypsin, ceruloplasmin, hepatitis labs and iron studies with ferritin.  Likely though the cause is her past alcoholism. 3.  Patient did have a recent ultrasound which did not show any ascites, though this was a couple of weeks ago and abdominal distention is seen on exam today, no appreciated fluid wave.  We will go ahead and start low-dose diuretics today as she is frail-appearing.  Started Lasix 20 mg daily and Spironolactone 50 mg daily both #30 with 1 refill, likely we can increase these in the future to desired ratio. 4.  Ordered repeat CBC and CMP in 1 week, can adjust diuretics if things look stable. 5.  Patient will need an EGD for variceal screening in the future, again there are many things to discuss today, we will schedule this at time of her next appointment.  It can be in the [facility] with Dr. [name]. 6.  Patient was encouraged to use MiraLAX twice daily, discussed that she can titrate this up to 4 times a day if needed for regular soft solid bowel movements.  Would like her to discontinue Senokot. 7.  Advised the patient to record daily weights so that we can make sure that the diuretics are working. 8.  Patient should completely abstain from alcohol 9.  Briefly reviewed a low-sodium diet, less than 2 g/day.  Again, patient given a lot of information on today's visit, so we will likely need to reiterate. 10.  Discussed Hemoccult positive stools, patient did have a recent colonoscopy and rectal exam  is non-concerning today with only internal hemorrhoids which are likely the cause and/or her hemoptysis recently. 11.  Encouraged the patient to call [facility] herself to make an appointment for her right upper quadrant pain and known cholelithiasis.  Provided her with their number. 12.  Again patient will follow-up shortly in 2 weeks with me.  At that time we will have repeat CMP prior to visit, can likely increase diuretics and schedule for an EGD at that time.  Christine Newer, PA-C [facility] 04/27/2018, 9:13 AM  Cc: [facility], [name], MD

## 2018-04-28 DIAGNOSIS — K8020 Calculus of gallbladder without cholecystitis without obstruction: Secondary | ICD-10-CM | POA: Diagnosis not present

## 2018-04-28 DIAGNOSIS — K7030 Alcoholic cirrhosis of liver without ascites: Secondary | ICD-10-CM | POA: Diagnosis not present

## 2018-05-01 ENCOUNTER — Telehealth: Payer: Self-pay | Admitting: *Deleted

## 2018-05-01 NOTE — Telephone Encounter (Signed)
I do not have her surgery note or know where she went  I need to review it and let her know Please send for that  Thanks

## 2018-05-01 NOTE — Telephone Encounter (Signed)
Pt contacted office stating spironolactone she was prescribed is causing her to have foot swelling. Advised pt to contact prescribing provider as she was just given medication on 1/16.

## 2018-05-01 NOTE — Telephone Encounter (Signed)
Pt said she called GI and they never called her back but the did schedule an appt on Thursday. I advise pt if sxs worsen or she develops any new sxs to call them back before her appt and let them know. Pt verbalized understanding.  Pt said she doesn't have any of the sxs Dr. Glori Bickers listed in prev message, just the leg/ankle swelling   ** Pt did want me to ask Dr. Glori Bickers another question:  Pt said she saw the surgeon regarding her gallbladder and she said they told her "they wont operate on me" so she wants to know what's the next step regarding her gallbladder since they will not operate

## 2018-05-01 NOTE — Telephone Encounter (Signed)
Called pt and advised of Dr. Marliss Coots comments.    [facility] Surgery and left VM with Medical Records requesting OV notes to be faxed to me

## 2018-05-01 NOTE — Telephone Encounter (Signed)
Oddly enough-those are both diuretics (this should not cause swelling- rather help it)  Please have her direct concern to her GI provider since the swelling may have to do with her cirrhosis   Please ask if any CP or shortness of breath as well  Also any leg pain or redness

## 2018-05-01 NOTE — Telephone Encounter (Signed)
[facility] Patient Name: Christine Barton Gender: Female DOB: [birth year] Age: 68 Y 30 M 16 D Return Phone Number: [phone] (Primary) Address: City/State/Zip: [place] [address] Client [place] Night - Client Client Site [place] [facility], Roque Lias - MD Contact Type Call Who Is Calling Patient / Member / Family / Caregiver Call Type Triage / Clinical Relationship To Patient Self Return Phone Number [phone] (Primary) Chief Complaint Feet swelling Reason for Call Symptomatic / Request for [name] states she spoke to a nurse earlier today about her swollen feet and ankles. The caller states her feet and ankles are still very swollen. Pt took her Furosemide and Spironolactone earlier. Has taken the meds for 2 days. Translation No Nurse Assessment Nurse: Ronnald Ramp, RN, Olivia Mackie Date/Time (Eastern Time): 04/29/2018 2:32:20 PM Confirm and document reason for call. If symptomatic, describe symptoms. ---Caller states that she is having swelling in her ankles and her feet. She's taken the second day dose of furosemide 5m and Spironolactone 560m She took this morning meds, and was unsure if she was to call back if her ankles and feet were still swollen. Does the patient have any new or worsening symptoms? ---Yes Will a triage be completed? ---Yes Related visit to physician within the last 2 weeks? ---Yes Does the PT have any chronic conditions? (i.e. diabetes, asthma, this includes High risk factors for pregnancy, etc.) ---Yes List chronic conditions. ---Swelling in her belly, chest, feet and ankles. She's borderline diabetic. Is this a behavioral health or substance abuse call? ---No Guidelines Guideline Title Affirmed Question Affirmed Notes Nurse Date/Time (EEilene Ghaziime) Leg Swelling and Edema  [1] MILD swelling of both ankles (i.e., pedal edema) AND [2] new onset or worsening JoCephus Slater/18/2020 2:38:59 PM Disp. Time (EEilene Ghaziime) Disposition Final User 04/29/2018 2:48:21 PM SEE PCP WITHIN 3 DAYS Yes JoRonnald RampRN, TrOlivia MackieLEASE NOTE: All timestamps contained within this report are represented as [place] Time. CONFIDENTIALTY NOTICE: This fax transmission is intended only for the addressee. It contains information that is legally privileged, confidential or otherwise protected from use or disclosure. If you are not the intended recipient, you are strictly prohibited from reviewing, disclosing, copying using or disseminating any of this information or taking any action in reliance on or regarding this information. If you have received this fax in error, please notify usKoreammediately by telephone so that we can arrange for its return to usKoreaPhone: [phone]-Free: [phone]: [phone]: 41 of 2 Call Id: [id number] Disagree/Comply Comply Caller Understands Yes PreDisposition Call Doctor Care Advice Given Per Guideline SEE PCP WITHIN 3 DAYS: * You need to be seen within 2 or 3 days. Call your doctor (or NP/PA) during regular office hours and make an appointment. A clinic or urgent care center are good places to go for care if your doctor's office is closed or you can't get an appointment. NOTE: If office will be open tomorrow, tell caller to call then, not in 3 days. LEG SWELLING AND/OR EDEMA: * Elevate your legs or try to lay down once or twice daily for 20 minutes. * Avoid socks with an elastic band at the top. Wear comfortable shoes. CALL BACK IF: * Swelling becomes worse * Swelling becomes red or painful to the touch * You become worse. CARE ADVICE given per Leg Swelling and Edema (Adult) guideline. Referrals  REFERRED TO PCP OFFICE

## 2018-05-01 NOTE — Telephone Encounter (Signed)
[facility] Call Center Patient Name: Christine Barton Gender: Female DOB: [birth year] Age: 68 Y 45 M 16 D Return Phone Number: [phone] (Primary) Address: City/State/Zip: [place] [address] Client [place] Night - Client Client Site [place] [facility]ician [facility], Roque Lias - MD Contact Type Call Who Is Calling Patient / Member / Family / Caregiver Call Type Triage / Clinical Relationship To Patient Self Return Phone Number [phone] (Primary) Chief Complaint Medication reaction Reason for Call Symptomatic / Request for [place] states she is taking medications to reduce her stomach but it is causing her ankles to swell. She is taking furosemide and spironolacton. Translation No Nurse Assessment Nurse: Windle Guard, RN, Lesa Date/Time (Eastern Time): 04/29/2018 10:54:38 AM Confirm and document reason for call. If symptomatic, describe symptoms. ---Caller states she has edema in both ankles. She started Furosemide and Spironolactone on Friday morning. Does the patient have any new or worsening symptoms? ---Yes Will a triage be completed? ---Yes Related visit to physician within the last 2 weeks? ---No Does the PT have any chronic conditions? (i.e. diabetes, asthma, this includes High risk factors for pregnancy, etc.) ---No Is this a behavioral health or substance abuse call? ---No Guidelines Guideline Title Affirmed Question Affirmed Notes Nurse Date/Time Eilene Ghazi Time) Leg Swelling and Edema [1] MILD swelling of both ankles (i.e., pedal edema) AND [2] new onset or worsening Conner, RN, Lesa 04/29/2018 10:56:33 AM Disp. Time Eilene Ghazi Time) Disposition Final User 04/29/2018 11:02:26 AM SEE PCP WITHIN 3 DAYS Yes Conner, RN, Lesa Caller Disagree/Comply Comply Caller Understands Yes PreDisposition Did not know what to  do PLEASE NOTE: All timestamps contained within this report are represented as [place] Standard Time. CONFIDENTIALTY NOTICE: This fax transmission is intended only for the addressee. It contains information that is legally privileged, confidential or otherwise protected from use or disclosure. If you are not the intended recipient, you are strictly prohibited from reviewing, disclosing, copying using or disseminating any of this information or taking any action in reliance on or regarding this information. If you have received this fax in error, please notify us immediately by telephone so that we can arrange for its return to [place]. Phone: [phone], Toll-Free: [phone], Fax: [phone] Page: 2 of 2 Call Id: [id number] Care Advice Given Per Guideline SEE PCP WITHIN 3 DAYS: * You need to be seen within 2 or 3 days. Call your doctor (or NP/PA) during regular office hours and make an appointment. A clinic or urgent care center are good places to go for care if your doctor's office is closed or you can't get an appointment. NOTE: If office will be open tomorrow, tell caller to call then, not in 3 days. LEG SWELLING AND/OR EDEMA: * Elevate your legs or try to lay down once or twice daily for 20 minutes. * Avoid socks with an elastic band at the top. Wear comfortable shoes. CALL BACK IF: * Swelling becomes worse * Swelling becomes red or painful to the touch * Calf pain occurs and becomes constant * You become worse. CARE ADVICE given per Leg Swelling and Edema (Adult) guideline

## 2018-05-02 ENCOUNTER — Telehealth: Payer: Self-pay | Admitting: Family Medicine

## 2018-05-02 DIAGNOSIS — E559 Vitamin D deficiency, unspecified: Secondary | ICD-10-CM

## 2018-05-02 DIAGNOSIS — E7800 Pure hypercholesterolemia, unspecified: Secondary | ICD-10-CM

## 2018-05-02 DIAGNOSIS — R7303 Prediabetes: Secondary | ICD-10-CM

## 2018-05-02 DIAGNOSIS — R042 Hemoptysis: Secondary | ICD-10-CM

## 2018-05-02 DIAGNOSIS — K7031 Alcoholic cirrhosis of liver with ascites: Secondary | ICD-10-CM

## 2018-05-02 DIAGNOSIS — R195 Other fecal abnormalities: Secondary | ICD-10-CM

## 2018-05-02 LAB — ANA: Anti Nuclear Antibody(ANA): NEGATIVE

## 2018-05-02 LAB — HEPATITIS A ANTIBODY, TOTAL: Hepatitis A AB,Total: NONREACTIVE

## 2018-05-02 LAB — ALPHA-1-ANTITRYPSIN: A-1 Antitrypsin, Ser: 259 mg/dL — ABNORMAL HIGH (ref 83–199)

## 2018-05-02 LAB — CERULOPLASMIN: Ceruloplasmin: 36 mg/dL (ref 18–53)

## 2018-05-02 LAB — AFP TUMOR MARKER: AFP-Tumor Marker: 6 ng/mL

## 2018-05-02 LAB — HEPATITIS C ANTIBODY
Hepatitis C Ab: NONREACTIVE
SIGNAL TO CUT-OFF: 0.05 (ref <1.00)

## 2018-05-02 LAB — MITOCHONDRIAL ANTIBODIES: Mitochondrial M2 Ab, IgG: <=20 U

## 2018-05-02 LAB — ANTI-SMOOTH MUSCLE ANTIBODY, IGG: Actin (Smooth Muscle) Antibody (IGG): <20 U (ref <20)

## 2018-05-02 LAB — HEPATITIS B SURFACE ANTIBODY,QUALITATIVE: Hep B S Ab: NONREACTIVE

## 2018-05-02 LAB — HEPATITIS B SURFACE ANTIGEN: Hepatitis B Surface Ag: NONREACTIVE

## 2018-05-02 NOTE — Telephone Encounter (Signed)
She just had a physical in July and has one scheduled for next July ?

## 2018-05-02 NOTE — Telephone Encounter (Signed)
-----   Message from Jinny Sanders, MD sent at 05/02/2018  6:01 PM EST ----- Regarding: FW: Lab orders for Wednesday, 1.22.20  [facility] PCP ----- Message ----- From: Ellamae Sia Sent: 04/24/2018   9:54 AM EST To: Jinny Sanders, MD Subject: Lab orders for Wednesday, 1.22.20              Patient is scheduled for CPX labs, please order future labs, Thanks , Karna Christmas

## 2018-05-03 NOTE — Telephone Encounter (Signed)
Sorry, it's labs for 6 month f/u.

## 2018-05-04 ENCOUNTER — Other Ambulatory Visit (INDEPENDENT_AMBULATORY_CARE_PROVIDER_SITE_OTHER): Payer: Medicare HMO

## 2018-05-04 ENCOUNTER — Ambulatory Visit: Payer: Medicare HMO | Admitting: Physician Assistant

## 2018-05-04 ENCOUNTER — Encounter: Payer: Self-pay | Admitting: Physician Assistant

## 2018-05-04 ENCOUNTER — Other Ambulatory Visit: Payer: Self-pay

## 2018-05-04 ENCOUNTER — Encounter: Payer: Self-pay | Admitting: Gastroenterology

## 2018-05-04 VITALS — BP 124/70 | HR 82 | Ht 65.0 in | Wt 127.0 lb

## 2018-05-04 DIAGNOSIS — R195 Other fecal abnormalities: Secondary | ICD-10-CM

## 2018-05-04 DIAGNOSIS — K7031 Alcoholic cirrhosis of liver with ascites: Secondary | ICD-10-CM

## 2018-05-04 DIAGNOSIS — K8020 Calculus of gallbladder without cholecystitis without obstruction: Secondary | ICD-10-CM

## 2018-05-04 DIAGNOSIS — K5900 Constipation, unspecified: Secondary | ICD-10-CM | POA: Diagnosis not present

## 2018-05-04 DIAGNOSIS — R140 Abdominal distension (gaseous): Secondary | ICD-10-CM

## 2018-05-04 DIAGNOSIS — R1011 Right upper quadrant pain: Secondary | ICD-10-CM

## 2018-05-04 DIAGNOSIS — K7460 Unspecified cirrhosis of liver: Secondary | ICD-10-CM | POA: Diagnosis not present

## 2018-05-04 LAB — CBC WITH DIFFERENTIAL/PLATELET
Basophils Absolute: 0 10*3/uL (ref 0.0–0.1)
Basophils Relative: 0.4 % (ref 0.0–3.0)
Eosinophils Absolute: 0.1 10*3/uL (ref 0.0–0.7)
Eosinophils Relative: 0.6 % (ref 0.0–5.0)
HCT: 33 % — ABNORMAL LOW (ref 36.0–46.0)
Hemoglobin: 11.2 g/dL — ABNORMAL LOW (ref 12.0–15.0)
Lymphocytes Relative: 18.6 % (ref 12.0–46.0)
Lymphs Abs: 1.7 10*3/uL (ref 0.7–4.0)
MCHC: 33.9 g/dL (ref 30.0–36.0)
MCV: 99 fl (ref 78.0–100.0)
Monocytes Absolute: 0.8 10*3/uL (ref 0.1–1.0)
Monocytes Relative: 9 % (ref 3.0–12.0)
Neutro Abs: 6.5 10*3/uL (ref 1.4–7.7)
Neutrophils Relative %: 71.4 % (ref 43.0–77.0)
Platelets: 183 10*3/uL (ref 150.0–400.0)
RBC: 3.33 Mil/uL — ABNORMAL LOW (ref 3.87–5.11)
RDW: 13.9 % (ref 11.5–15.5)
WBC: 9.1 10*3/uL (ref 4.0–10.5)

## 2018-05-04 LAB — COMPREHENSIVE METABOLIC PANEL
ALT: 11 U/L (ref 0–35)
AST: 40 U/L — ABNORMAL HIGH (ref 0–37)
Albumin: 2.9 g/dL — ABNORMAL LOW (ref 3.5–5.2)
Alkaline Phosphatase: 133 U/L — ABNORMAL HIGH (ref 39–117)
BUN: 7 mg/dL (ref 6–23)
CO2: 29 mEq/L (ref 19–32)
Calcium: 9.2 mg/dL (ref 8.4–10.5)
Chloride: 101 mEq/L (ref 96–112)
Creatinine, Ser: 0.77 mg/dL (ref 0.40–1.20)
GFR: 74.68 mL/min (ref >60.00)
Glucose, Bld: 190 mg/dL — ABNORMAL HIGH (ref 70–99)
Potassium: 3.6 mEq/L (ref 3.5–5.1)
Sodium: 136 mEq/L (ref 135–145)
Total Bilirubin: 2.1 mg/dL — ABNORMAL HIGH (ref 0.2–1.2)
Total Protein: 6.6 g/dL (ref 6.0–8.3)

## 2018-05-04 MED ORDER — FUROSEMIDE 20 MG PO TABS: 40.0000 mg | ORAL_TABLET | Freq: Every day | ORAL | 0 refills | Status: DC

## 2018-05-04 MED ORDER — SPIRONOLACTONE 50 MG PO TABS: 50.0000 mg | ORAL_TABLET | Freq: Two times a day (BID) | ORAL | 0 refills | Status: DC

## 2018-05-04 NOTE — Patient Instructions (Addendum)
You have been scheduled for an endoscopy. Please follow written instructions given to you at your visit today. If you use inhalers (even only as needed), please bring them with you on the day of your procedure. Your physician has requested that you go to www.startemmi.com and enter the access code given to you at your visit today. This web site gives a general overview about your procedure. However, you should still follow specific instructions given to you by our office regarding your preparation for the procedure.  Your provider has requested that you go to the basement level for lab work before leaving today. Press "B" on the elevator. The lab is located at the first door on the left as you exit the elevator.  Please increase your miralax to 17 grams 4 times daily  Continue a low sodium diet (less than 2 grams daily).  Please keep a record of your daily weight.  Please schedule a follow up with Dr [name] for March 2020.  If you are age 57 or older, your body mass index should be between 23-30. Your Body mass index is 21.13 kg/m. If this is out of the aforementioned range listed, please consider follow up with your Primary Care Provider.  If you are age 70 or younger, your body mass index should be between 19-25. Your Body mass index is 21.13 kg/m. If this is out of the aformentioned range listed, please consider follow up with your Primary Care Provider.

## 2018-05-04 NOTE — Progress Notes (Signed)
Chief Complaint: Follow-up for decompensated alcoholic cirrhosis with ascites  HPI:    Christine Barton is a 68 year old Caucasian female with a past medical history as listed below including alcohol abuse, known to Dr. [name], who returns to clinic today for follow-up of her decompensated alcoholic cirrhosis with ascites.    04/27/2018 office visit for positive Hemoccult, elevated LFTs, hemoptysis, cholelithiasis with right upper quadrant pain.  At that time recent colonoscopy 11/30/2016 was noted.  Her new diagnosis of cirrhosis was discussed and further liver serologies were done to rule out other causes, these all returned negative/normal and her cirrhosis is thought from alcoholism.  Hemoccult positive stool discussed, recent colonoscopy and rectal exam with hemorrhoids which are likely the cause.  Patient had right upper quadrant pain with cholelithiasis and was referred to [facility].  Patient was started on Lasix 20 mg daily and Spironolactone 50 mg daily with a repeat CBC and CMP in a week.  She was set up for a quick return visit in order to discuss EGD.    Today, the patient tells me that she has been using her diuretics and has not noticed any change in urine output.  She does feel though that her abdomen is somewhat smaller and less tight over the past week or so.  She has not been monitoring her daily weights.  Tells me that her abdomen is not uncomfortable.    Does continue to complain of some constipation, currently using MiraLAX twice a day but still has to use suppositories in order to have a bowel movement.    Tells me that [facility] has referred her to [facility] for possible cholecystectomy, this appointment is in February.    Denies fever, chills, anorexia, nausea, vomiting, heartburn or reflux.  Past Medical History:  Diagnosis Date  . Alcohol abuse, unspecified   . Breast cancer ([place]) 1998   Right  . Cataract    left eye  . Cervical spondylosis 2006   MRI  . Degeneration of cervical  intervertebral disc 2006   MRI  . Diverticulosis of colon (without mention of hemorrhage)   . Hyperpotassemia   . Microscopic hematuria   . Mononeuritis of unspecified site   . Nonspecific abnormal results of liver function study   . Other abnormal glucose   . Other and unspecified hyperlipidemia    no per pt  . Other chronic nonalcoholic liver disease   . Personal history of chemotherapy   . Personal history of malignant neoplasm of breast   . Personal history of radiation therapy   . Pneumothorax, acute    right, spontaneous  . Tobacco use disorder   . Unspecified vitamin D deficiency     Past Surgical History:  Procedure Laterality Date  . BREAST BIOPSY  9/03   Right  . BREAST LUMPECTOMY Right 1998  . BREAST LUMPECTOMY  1998  . CHEST TUBE INSERTION  11/02/2014  . COLONOSCOPY    . EYE SURGERY  02/2017   cataract extraction with lens implant  . TUBAL LIGATION      Current Outpatient Medications  Medication Sig Dispense Refill  . cyclobenzaprine (FLEXERIL) 10 MG tablet TAKE ONE TABLET BY MOUTH THREE TIMES DAILY AS NEEDED FOR MUSCLE SPASMS 90 tablet 0  . furosemide (LASIX) 20 MG tablet Take 1 tablet (20 mg total) by mouth daily. 30 tablet 0  . indomethacin (INDOCIN) 50 MG capsule TAKE ONE CAPSULE BY MOUTH THREE TIMES DAILY AS NEEDED FOR PAIN WITH FOOD 90 capsule 2  .  Multiple Vitamin (MULTIVITAMIN WITH MINERALS) TABS tablet Take 1 tablet by mouth daily.    . mupirocin ointment (BACTROBAN) 2 % APPLY A SMALL AMOUNT TO AFFECTED AREA ONINSIDE OF NOSE 2 TIMES DAILY AS DIRECTED 30 g 0  . polyethylene glycol (MIRALAX / GLYCOLAX) packet Take 17 g by mouth 2 (two) times daily.    Marland Kitchen spironolactone (ALDACTONE) 50 MG tablet Take 1 tablet by mouth daily. 30 tablet 0   No current facility-administered medications for this visit.     Allergies as of 05/04/2018 - Review Complete 05/04/2018  Allergen Reaction Noted  . Oxycodone Nausea And Vomiting 11/02/2014  . Glipizide Other (See  Comments) 09/12/2013  . Metformin and related Other (See Comments) 02/02/2013    Family History  Problem Relation Age of Onset  . Heart failure Father   . Colon cancer Maternal Uncle   . Stroke Mother   . Esophageal cancer Neg Hx   . Rectal cancer Neg Hx   . Stomach cancer Neg Hx     Social History   Socioeconomic History  . Marital status: Single    Spouse name: Not on file  . Number of children: 1  . Years of education: Not on file  . Highest education level: Not on file  Occupational History  . Occupation: retired    Fish farm manager: [employer]  Social Needs  . Financial resource strain: Not on file  . Food insecurity:    Worry: Not on file    Inability: Not on file  . Transportation needs:    Medical: Not on file    Non-medical: Not on file  Tobacco Use  . Smoking status: Current Every Day Smoker    Packs/day: 1.00    Years: 53.00    Pack years: 53.00    Types: Cigarettes  . Smokeless tobacco: Never Used  . Tobacco comment: tobacco info given 04/27/2018  Substance and Sexual Activity  . Alcohol use: Not Currently    Comment: occasional use  . Drug use: No  . Sexual activity: Not on file  Lifestyle  . Physical activity:    Days per week: Not on file    Minutes per session: Not on file  . Stress: Not on file  Relationships  . Social connections:    Talks on phone: Not on file    Gets together: Not on file    Attends religious service: Not on file    Active member of club or organization: Not on file    Attends meetings of clubs or organizations: Not on file    Relationship status: Not on file  . Intimate partner violence:    Fear of current or ex partner: Not on file    Emotionally abused: Not on file    Physically abused: Not on file    Forced sexual activity: Not on file  Other Topics Concern  . Not on file  Social History Narrative   Divorced      1 child      Works at [employer]:    Constitutional: No  fever or chills Cardiovascular: No chest pain   Respiratory: No SOB  Gastrointestinal: See HPI and otherwise negative   Physical Exam:  Vital signs: BP 124/70   Pulse 82   Ht _0  (1.651 m)   Wt 127 lb (57.6 kg)   BMI 21.13 kg/m   Constitutional:   Pleasant Elderly, frail-appearing, Caucasian female  appears to be in NAD, Well developed, Well nourished, alert and cooperative Respiratory: Respirations even and unlabored. Lungs clear to auscultation bilaterally.   No wheezes, crackles, or rhonchi.  Cardiovascular: Normal S1, S2. No MRG. Regular rate and rhythm. No peripheral edema, cyanosis or pallor.  Gastrointestinal:  Soft, mild distension, nontender. No rebound or guarding. Normal bowel sounds. No appreciable masses or hepatomegaly. Rectal:  Not performed.  Psychiatric:  Demonstrates good judgement and reason without abnormal affect or behaviors.  MOST RECENT LABS AND IMAGING: CBC    Component Value Date/Time   WBC 9.2 04/19/2018 1105   RBC 3.66 (L) 04/19/2018 1105   HGB 12.5 04/19/2018 1105   HCT 37.4 04/19/2018 1105   PLT 178.2 04/19/2018 1105   MCV 102.4 (H) 04/19/2018 1105   MCH 35.6 (H) 11/02/2014 1521   MCHC 33.4 04/19/2018 1105   RDW 14.3 04/19/2018 1105   LYMPHSABS 2.3 04/19/2018 1105   MONOABS 0.9 04/19/2018 1105   EOSABS 0.2 04/19/2018 1105   BASOSABS 0.0 04/19/2018 1105    CMP     Component Value Date/Time   NA 136 04/18/2018 1437   K 4.6 04/18/2018 1437   CL 103 04/18/2018 1437   CO2 28 04/18/2018 1437   GLUCOSE 136 (H) 04/18/2018 1437   BUN 6 04/18/2018 1437   CREATININE 0.65 04/18/2018 1437   CALCIUM 9.6 04/18/2018 1437   PROT 6.8 04/18/2018 1437   ALBUMIN 3.2 (L) 04/18/2018 1437   AST 75 (H) 04/18/2018 1437   ALT 15 04/18/2018 1437   ALKPHOS 158 (H) 04/18/2018 1437   BILITOT 1.6 (H) 04/18/2018 1437   GFRNONAA >60 11/02/2014 1521   GFRAA >60 11/02/2014 1521    Assessment: 1.  Decompensated alcoholic cirrhosis with ascites: Recent  ultrasound with mild to moderate amount of ascites, patient describes a decrease in abdominal distention and no discomfort today, started on Spironolactone 50 mg daily and Lasix 20 mg daily at last visit, will increase as long as electrolytes and kidney function are stable 2.  Cholelithiasis with right upper quadrant pain: Continues to follow with surgery in regards to possible cholecystectomy, has been referred to [facility] 3.  Constipation: Some better with MiraLAX twice daily and a suppository  Plan: 1.  Repeat CBC and CMP today.  If electrolytes remain stable will increase Spironolactone to 50 mg twice daily and Lasix 40 mg daily.  If anything is abnormal, could consider an ultrasound-guided paracentesis. 2.  Scheduled patient for an EGD in the [place] with Dr. [name] for variceal screening.  Did discuss risk, benefits, limitations and alternatives and patient agrees to proceed. 3.  Recommend the patient increase her MiraLAX up to 4 times a day to assist with bowel movements. 4.  Patient to continue following with surgery to discuss cholelithiasis and right upper quadrant pain. 5.  Patient was again told to abide by low-sodium diet, less than 2 g/day and to take daily weights and abstain from alcohol. 6.  Patient was arranged for a follow-up appointment with Dr. [name] about a month from now, after time of EGD.  She has not met with Dr. [name] since new diagnosis of cirrhosis.  Christine Newer, PA-C [facility] 05/04/2018, 10:37 AM  Cc: Tower, [name], MD

## 2018-05-05 NOTE — Progress Notes (Signed)
Reviewed and agree with documentation and assessment and plan. K. Veena Nandigam , MD   

## 2018-05-08 ENCOUNTER — Telehealth: Payer: Self-pay | Admitting: Gastroenterology

## 2018-05-08 ENCOUNTER — Telehealth: Payer: Self-pay | Admitting: Family Medicine

## 2018-05-08 DIAGNOSIS — K7460 Unspecified cirrhosis of liver: Secondary | ICD-10-CM

## 2018-05-08 NOTE — Telephone Encounter (Signed)
Spoke with medical records representative and they are faxing notes right now

## 2018-05-08 NOTE — Telephone Encounter (Signed)
Thanks I reviewed her note from [facility]- and they concluded that her surgical risk is high in light of the cirrhosis  They recommended she have an evaluation at a more specialized (tertiary) surgical center for this  (that makes sense) and it was mentioned that they would refer her (but it does not say when or where)  If she has not heard back from them regarding this- I recommended she call them ([facility])   Please keep me posted

## 2018-05-08 NOTE — Telephone Encounter (Signed)
Patient states her increase on the medications Spirolactone to 50 mg twice daily and Lasix 40 mg daily has not been helping, Her belly is still swollen and wants to know what she should do   Dr [name] Please advise

## 2018-05-08 NOTE — Telephone Encounter (Signed)
Not yet- even checked my In box Maybe they will come today Thanks

## 2018-05-08 NOTE — Telephone Encounter (Signed)
Please advise patient to increase Lasix to 60 mg daily and Aldactone 164m daily. Recheck BMP on Thursday 1/30. Follow up in office visit next available with myself or APP in 2 weeks.

## 2018-05-08 NOTE — Telephone Encounter (Signed)
Records received and placed for review for Dr. Glori Bickers

## 2018-05-08 NOTE — Telephone Encounter (Signed)
Patient will be in Tuesday to have labs drawn, And has an EGD scheduled on 05/16/2018

## 2018-05-08 NOTE — Telephone Encounter (Signed)
error 

## 2018-05-08 NOTE — Telephone Encounter (Signed)
Patient called back and states she is scheduled to see Dr. Trixie Deis [facility] [place]. Phone number [phone] per patient. Just an [place]

## 2018-05-08 NOTE — Telephone Encounter (Signed)
Patient advised. Patient states she is set up to go to [facility] center on 05/24/2018-patient is not able to find her information in regards to this right now. She will call back if she finds her paperwork with the name of the provider.

## 2018-05-08 NOTE — Telephone Encounter (Signed)
Dr. Glori Bickers. Have you received these records yet? Shapale called a few days ago and left a message to have this sent over.

## 2018-05-08 NOTE — Telephone Encounter (Signed)
Pt wants a call back to discuss the medication that she is taking

## 2018-05-10 ENCOUNTER — Other Ambulatory Visit (INDEPENDENT_AMBULATORY_CARE_PROVIDER_SITE_OTHER): Payer: Medicare HMO

## 2018-05-10 ENCOUNTER — Telehealth: Payer: Self-pay | Admitting: Physician Assistant

## 2018-05-10 ENCOUNTER — Other Ambulatory Visit: Payer: Self-pay

## 2018-05-10 ENCOUNTER — Telehealth: Payer: Self-pay | Admitting: *Deleted

## 2018-05-10 ENCOUNTER — Ambulatory Visit: Payer: Medicare PPO | Admitting: Family Medicine

## 2018-05-10 DIAGNOSIS — K7031 Alcoholic cirrhosis of liver with ascites: Secondary | ICD-10-CM

## 2018-05-10 DIAGNOSIS — E876 Hypokalemia: Secondary | ICD-10-CM

## 2018-05-10 LAB — CBC WITH DIFFERENTIAL/PLATELET
Basophils Absolute: 0 10*3/uL (ref 0.0–0.1)
Basophils Relative: 0.4 % (ref 0.0–3.0)
Eosinophils Absolute: 0.1 10*3/uL (ref 0.0–0.7)
Eosinophils Relative: 1 % (ref 0.0–5.0)
HCT: 33.4 % — ABNORMAL LOW (ref 36.0–46.0)
Hemoglobin: 11.3 g/dL — ABNORMAL LOW (ref 12.0–15.0)
Lymphocytes Relative: 24.1 % (ref 12.0–46.0)
Lymphs Abs: 2.4 10*3/uL (ref 0.7–4.0)
MCHC: 34 g/dL (ref 30.0–36.0)
MCV: 97.6 fl (ref 78.0–100.0)
Monocytes Absolute: 1.1 10*3/uL — ABNORMAL HIGH (ref 0.1–1.0)
Monocytes Relative: 10.7 % (ref 3.0–12.0)
Neutro Abs: 6.3 10*3/uL (ref 1.4–7.7)
Neutrophils Relative %: 63.8 % (ref 43.0–77.0)
Platelets: 133 10*3/uL — ABNORMAL LOW (ref 150.0–400.0)
RBC: 3.42 Mil/uL — ABNORMAL LOW (ref 3.87–5.11)
RDW: 14.3 % (ref 11.5–15.5)
WBC: 9.9 10*3/uL (ref 4.0–10.5)

## 2018-05-10 LAB — COMPREHENSIVE METABOLIC PANEL
ALT: 11 U/L (ref 0–35)
AST: 35 U/L (ref 0–37)
Albumin: 2.9 g/dL — ABNORMAL LOW (ref 3.5–5.2)
Alkaline Phosphatase: 131 U/L — ABNORMAL HIGH (ref 39–117)
BUN: 8 mg/dL (ref 6–23)
CO2: 28 mEq/L (ref 19–32)
Calcium: 9 mg/dL (ref 8.4–10.5)
Chloride: 98 mEq/L (ref 96–112)
Creatinine, Ser: 1.01 mg/dL (ref 0.40–1.20)
GFR: 54.6 mL/min — ABNORMAL LOW (ref >60.00)
Glucose, Bld: 205 mg/dL — ABNORMAL HIGH (ref 70–99)
Potassium: 3.2 mEq/L — ABNORMAL LOW (ref 3.5–5.1)
Sodium: 135 mEq/L (ref 135–145)
Total Bilirubin: 2.3 mg/dL — ABNORMAL HIGH (ref 0.2–1.2)
Total Protein: 6.6 g/dL (ref 6.0–8.3)

## 2018-05-10 MED ORDER — POTASSIUM CHLORIDE 20 MEQ PO PACK: 40.0000 meq | PACK | Freq: Every day | ORAL | 0 refills | Status: DC

## 2018-05-10 NOTE — Telephone Encounter (Signed)
Pt is okay with you speaking with her daughter, her daughter was with her and she already knew you were going to talk directly to her. Please call daughter back as soon as you can

## 2018-05-10 NOTE — Telephone Encounter (Signed)
Her daughter had questions about her mom re: cirrhosis and eval/tx of that , also gallstones I answered her questions  We will proceed as planned

## 2018-05-10 NOTE — Telephone Encounter (Signed)
Prescription was sent in as packets. Corrected it to tablets and pt will be able to pick it up after 6pm. Pt aware.

## 2018-05-10 NOTE — Telephone Encounter (Signed)
See result note.  

## 2018-05-10 NOTE — Telephone Encounter (Signed)
PT returned call.Marland KitchenJG

## 2018-05-10 NOTE — Telephone Encounter (Signed)
Pt called office stating she had a missed call from the office. I advised pt that Dr.Tower was in clinic and I would let Dr.Tower know she returned her call. Please call pt

## 2018-05-10 NOTE — Telephone Encounter (Signed)
Thanks- I think she has a DPR , but I still want permission from Christine Barton to talk to her daughter about her current health state (I think it is a good idea) -I just want to be sure  Let daughter know we will check with Lauree and then get back to her

## 2018-05-10 NOTE — Telephone Encounter (Signed)
Gwen stated that she was told to call the office and leave a message for Dr. Glori Bickers to call her. Gwen wants to know what Dr. Glori Bickers thinks is going on with her mother.

## 2018-05-10 NOTE — Telephone Encounter (Signed)
Pt called to inform that potassium chloride is not covered by her insurance and that pharmacy does not have it in stock.

## 2018-05-16 ENCOUNTER — Ambulatory Visit (AMBULATORY_SURGERY_CENTER): Payer: Medicare HMO | Admitting: Gastroenterology

## 2018-05-16 ENCOUNTER — Encounter: Payer: Self-pay | Admitting: Gastroenterology

## 2018-05-16 VITALS — BP 137/58 | HR 104 | Temp 97.3°F | Resp 14 | Ht 65.0 in | Wt 127.0 lb

## 2018-05-16 DIAGNOSIS — K766 Portal hypertension: Secondary | ICD-10-CM

## 2018-05-16 DIAGNOSIS — K209 Esophagitis, unspecified: Secondary | ICD-10-CM

## 2018-05-16 DIAGNOSIS — K7460 Unspecified cirrhosis of liver: Secondary | ICD-10-CM | POA: Diagnosis not present

## 2018-05-16 DIAGNOSIS — K3189 Other diseases of stomach and duodenum: Secondary | ICD-10-CM

## 2018-05-16 DIAGNOSIS — K449 Diaphragmatic hernia without obstruction or gangrene: Secondary | ICD-10-CM | POA: Diagnosis not present

## 2018-05-16 DIAGNOSIS — K7031 Alcoholic cirrhosis of liver with ascites: Secondary | ICD-10-CM | POA: Diagnosis not present

## 2018-05-16 MED ORDER — PANTOPRAZOLE SODIUM 40 MG PO TBEC: 40.0000 mg | DELAYED_RELEASE_TABLET | Freq: Every day | ORAL | 3 refills | Status: DC

## 2018-05-16 MED ORDER — SPIRONOLACTONE 50 MG PO TABS: 100.0000 mg | ORAL_TABLET | Freq: Every day | ORAL | 3 refills | Status: DC

## 2018-05-16 MED ORDER — SODIUM CHLORIDE 0.9 % IV SOLN: 500.0000 mL | Freq: Once | INTRAVENOUS | Status: DC

## 2018-05-16 NOTE — Progress Notes (Signed)
No problems noted in the recovery room. maw 

## 2018-05-16 NOTE — Patient Instructions (Signed)
YOU HAD AN ENDOSCOPIC PROCEDURE TODAY AT [facility]:   Refer to the procedure report that was given to you for any specific questions about what was found during the examination.  If the procedure report does not answer your questions, please call your gastroenterologist to clarify.  If you requested that your care partner not be given the details of your procedure findings, then the procedure report has been included in a sealed envelope for you to review at your convenience later.  YOU SHOULD EXPECT: Some feelings of bloating in the abdomen. Passage of more gas than usual.  Walking can help get rid of the air that was put into your GI tract during the procedure and reduce the bloating. If you had a lower endoscopy (such as a colonoscopy or flexible sigmoidoscopy) you may notice spotting of blood in your stool or on the toilet paper. If you underwent a bowel prep for your procedure, you may not have a normal bowel movement for a few days.  Please Note:  You might notice some irritation and congestion in your nose or some drainage.  This is from the oxygen used during your procedure.  There is no need for concern and it should clear up in a day or so.  SYMPTOMS TO REPORT IMMEDIATELY:     Following upper endoscopy (EGD)  Vomiting of blood or coffee ground material  New chest pain or pain under the shoulder blades  Painful or persistently difficult swallowing  New shortness of breath  Fever of 100F or higher  Black, tarry-looking stools  For urgent or emergent issues, a gastroenterologist can be reached at any hour by calling [phone].   DIET:  We do recommend a small meal at first, but then you may proceed to your regular diet.  Drink plenty of fluids but you should avoid alcoholic beverages for 24 hours.  ACTIVITY:  You should plan to take it easy for the rest of today and you should NOT DRIVE or use heavy machinery until tomorrow (because of the sedation medicines  used during the test).    FOLLOW UP: Our staff will call the number listed on your records the next business day following your procedure to check on you and address any questions or concerns that you may have regarding the information given to you following your procedure. If we do not reach you, we will leave a message.  However, if you are feeling well and you are not experiencing any problems, there is no need to return our call.  We will assume that you have returned to your regular daily activities without incident.  If any biopsies were taken you will be contacted by phone or by letter within the next 1-3 weeks.  Please call us at [phone] if you have not heard about the biopsies in 3 weeks.    SIGNATURES/CONFIDENTIALITY: You and/or your care partner have signed paperwork which will be entered into your electronic medical record.  These signatures attest to the fact that that the information above on your After Visit Summary has been reviewed and is understood.  Full responsibility of the confidentiality of this discharge information lies with you and/or your care-partner.    Handouts were given to your care partner on a Hiatal Hernia, GERD and Antireflux regimen indefinitely. This includes: -Do Not lie down 3 - 4 hours after meals. -Raise head of bed 4 - 6 inches. -Decrease excess weight. -Avoid citrus juices and other acidic foods, alcohol,  chocolate,mints,coffee and other caffeinated beverages, fatty and fried foods. -Avoid tight fitting clothing. -Avoid cigarettes and other tobacco products. -Avoid alcohol. -NO ASPIRIN, ASPIRIN CONTAINING PRODUCTS (BC OR GOODY POWDERS) OR NSAIDS (IBUPROFEN, ADVIL, ALEVE, AND MOTRIN) FOR. You may resume your current medications today. Add Protonix 40 mg by mouth 20-30 minutes before breakfast daily on an empty stomach. Take LASIX (Furosemide) 40 mg and Aldactone (Spiranolactone) 100 mg daily in the morning. Await biopsy results. Please call  if any questions or concerns.

## 2018-05-16 NOTE — Progress Notes (Signed)
PT taken to PACU. Monitors in place. VSS. Report given to RN. 

## 2018-05-16 NOTE — Op Note (Addendum)
[place] Patient Name: Christine Barton Procedure Date: 05/16/2018 10:14 AM MRN: [id number] Endoscopist: Mauri Pole , MD Age: 68 Referring MD:  Date of Birth: Jul 30, 1950 Gender: Female Account #: [id number] Procedure:                Upper GI endoscopy Indications:              To evaluate esophageal varices in patient with                            suspected portal hypertension (Decompensated                            cirrhosis with ascites) Medicines:                Monitored Anesthesia Care Procedure:                Pre-Anesthesia Assessment:                           - Prior to the procedure, a History and Physical                            was performed, and patient medications and                            allergies were reviewed. The patient's tolerance of                            previous anesthesia was also reviewed. The risks                            and benefits of the procedure and the sedation                            options and risks were discussed with the patient.                            All questions were answered, and informed consent                            was obtained. Prior Anticoagulants: The patient has                            taken no previous anticoagulant or antiplatelet                            agents. ASA Grade Assessment: III - A patient with                            severe systemic disease. After reviewing the risks                            and benefits, the patient was deemed in  satisfactory condition to undergo the procedure.                           After obtaining informed consent, the endoscope was                            passed under direct vision. Throughout the                            procedure, the patient's blood pressure, pulse, and                            oxygen saturations were monitored continuously. The                            Endoscope was introduced through  the mouth, and                            advanced to the second part of duodenum. The upper                            GI endoscopy was accomplished without difficulty.                            The patient tolerated the procedure well. Scope In: Scope Out: Findings:                 LA Grade A (one or more mucosal breaks less than 5                            mm, not extending between tops of 2 mucosal folds)                            esophagitis with no bleeding was found 33 to 35 cm                            from the incisors.                           Grade I, small (< 5 mm) varices were found in the                            lower third of the esophagus. They were less than 1                            mm in largest diameter.                           A 4 cm hiatal hernia was present.                           Diffuse portal hypertensive gastropathy was found  in the entire examined stomach.                           The examined duodenum was normal. Complications:            No immediate complications. Estimated Blood Loss:     Estimated blood loss was minimal. Impression:               - LA Grade A reflux esophagitis.                           - Grade I and small (< 5 mm) esophageal varices.                           - 4 cm hiatal hernia.                           - Portal hypertensive gastropathy.                           - Normal examined duodenum.                           - No specimens collected. Recommendation:           - Patient has a contact number available for                            emergencies. The signs and symptoms of potential                            delayed complications were discussed with the                            patient. Return to normal activities tomorrow.                            Written discharge instructions were provided to the                            patient.                           - Resume previous  diet.                           - Continue present medications.                           - No aspirin, ibuprofen, naproxen, or other                            non-steroidal anti-inflammatory drugs.                           - Use Protonix (pantoprazole) 40 mg PO daily.                           -  Take Lasix (Furosemide) 48m and Aldactone                            (Spiranolactone) 1017mdaily in the morning                           - Follow an antireflux regimen indefinitely. This                            includes:                           - Do not lie down for at least 3 to 4 hours after                            meals.                           - Raise the head of the bed 4 to 6 inches.                           - Decrease excess weight.                           - Avoid citrus juices and other acidic foods,                            alcohol, chocolate, mints, coffee and other                            caffeinated beverages, carbonated beverages, fatty                            and fried foods.                           - Avoid tight-fitting clothing.                           - Avoid cigarettes and other tobacco products.                           - Avoid Alcohol                           - Return to GI office in 3-4 weeks. KaMauri PoleMD 05/16/2018 10:35:22 AM This report has been signed electronically.

## 2018-05-17 ENCOUNTER — Other Ambulatory Visit: Payer: Self-pay | Admitting: Gastroenterology

## 2018-05-17 ENCOUNTER — Telehealth: Payer: Self-pay

## 2018-05-17 NOTE — Progress Notes (Signed)
wa

## 2018-05-17 NOTE — Telephone Encounter (Signed)
First post procedure follow up call, no answer 

## 2018-05-17 NOTE — Telephone Encounter (Signed)
  Follow up Call-  Call back number 05/16/2018 11/30/2016  Post procedure Call Back phone  # [phone] answering machine   Permission to leave phone message Yes Yes  Some recent data might be hidden     Patient questions:  Do you have a fever, pain , or abdominal swelling? No. Pain Score  0 *  Have you tolerated food without any problems? Yes.    Have you been able to return to your normal activities? Yes.    Do you have any questions about your discharge instructions: Diet   No. Medications  No. Follow up visit  No.  Do you have questions or concerns about your Care? No.  Actions: * If pain score is 4 or above: No action needed, pain <4.

## 2018-05-19 ENCOUNTER — Other Ambulatory Visit: Payer: Self-pay | Admitting: Physician Assistant

## 2018-05-24 DIAGNOSIS — K8020 Calculus of gallbladder without cholecystitis without obstruction: Secondary | ICD-10-CM | POA: Diagnosis not present

## 2018-05-24 DIAGNOSIS — F17210 Nicotine dependence, cigarettes, uncomplicated: Secondary | ICD-10-CM | POA: Diagnosis not present

## 2018-05-24 DIAGNOSIS — R12 Heartburn: Secondary | ICD-10-CM | POA: Diagnosis not present

## 2018-05-24 DIAGNOSIS — R042 Hemoptysis: Secondary | ICD-10-CM | POA: Diagnosis not present

## 2018-05-24 DIAGNOSIS — K5730 Diverticulosis of large intestine without perforation or abscess without bleeding: Secondary | ICD-10-CM | POA: Diagnosis not present

## 2018-05-24 DIAGNOSIS — K7031 Alcoholic cirrhosis of liver with ascites: Secondary | ICD-10-CM | POA: Diagnosis not present

## 2018-05-25 ENCOUNTER — Telehealth: Payer: Self-pay | Admitting: Physician Assistant

## 2018-05-25 NOTE — Telephone Encounter (Signed)
The patient has been notified of this information and all questions answered.

## 2018-05-25 NOTE — Telephone Encounter (Signed)
Thanks Beth, yes she should come get her labs.   Thanks-JLL

## 2018-05-25 NOTE — Telephone Encounter (Signed)
Spoke with the patient. She has felt weak and sometimes unsteady on her feet. She is due follow up Cmet as ordered by Ellouise Newer, PAC. Agrees to come get this done today or in the morning. She does not drive herself. Depends on her child for transportation.  Sending this message to both providers.

## 2018-05-25 NOTE — Telephone Encounter (Signed)
Christine Barton,   This is a Dr. [name] patient, she looks like she just had a procedure with her on 02/04.

## 2018-05-25 NOTE — Telephone Encounter (Signed)
Pt was prescribed furosemide and spironolactone and reported losing balance and falling several times.  Pt would like to know if this is a side effect of the meds.

## 2018-05-26 ENCOUNTER — Other Ambulatory Visit (INDEPENDENT_AMBULATORY_CARE_PROVIDER_SITE_OTHER): Payer: Medicare HMO

## 2018-05-26 ENCOUNTER — Other Ambulatory Visit: Payer: Self-pay

## 2018-05-26 DIAGNOSIS — E876 Hypokalemia: Secondary | ICD-10-CM

## 2018-05-26 LAB — COMPREHENSIVE METABOLIC PANEL
ALT: 11 U/L (ref 0–35)
AST: 33 U/L (ref 0–37)
Albumin: 2.7 g/dL — ABNORMAL LOW (ref 3.5–5.2)
Alkaline Phosphatase: 154 U/L — ABNORMAL HIGH (ref 39–117)
BUN: 9 mg/dL (ref 6–23)
CO2: 28 mEq/L (ref 19–32)
Calcium: 8.8 mg/dL (ref 8.4–10.5)
Chloride: 102 mEq/L (ref 96–112)
Creatinine, Ser: 0.87 mg/dL (ref 0.40–1.20)
GFR: 64.86 mL/min (ref >60.00)
Glucose, Bld: 126 mg/dL — ABNORMAL HIGH (ref 70–99)
Potassium: 4.1 mEq/L (ref 3.5–5.1)
Sodium: 139 mEq/L (ref 135–145)
Total Bilirubin: 2.8 mg/dL — ABNORMAL HIGH (ref 0.2–1.2)
Total Protein: 6.8 g/dL (ref 6.0–8.3)

## 2018-05-26 NOTE — Telephone Encounter (Signed)
Yes, please check if patient came in for labs. Thanks

## 2018-05-26 NOTE — Telephone Encounter (Signed)
Labs are in

## 2018-05-30 ENCOUNTER — Encounter: Payer: Self-pay | Admitting: Family Medicine

## 2018-05-30 ENCOUNTER — Telehealth: Payer: Self-pay

## 2018-05-30 ENCOUNTER — Ambulatory Visit (INDEPENDENT_AMBULATORY_CARE_PROVIDER_SITE_OTHER): Payer: Medicare HMO | Admitting: Family Medicine

## 2018-05-30 VITALS — BP 124/56 | HR 106 | Temp 97.7°F | Ht 65.0 in | Wt 120.1 lb

## 2018-05-30 DIAGNOSIS — F17200 Nicotine dependence, unspecified, uncomplicated: Secondary | ICD-10-CM

## 2018-05-30 DIAGNOSIS — K8020 Calculus of gallbladder without cholecystitis without obstruction: Secondary | ICD-10-CM

## 2018-05-30 DIAGNOSIS — F1011 Alcohol abuse, in remission: Secondary | ICD-10-CM | POA: Diagnosis not present

## 2018-05-30 DIAGNOSIS — R2689 Other abnormalities of gait and mobility: Secondary | ICD-10-CM

## 2018-05-30 DIAGNOSIS — R531 Weakness: Secondary | ICD-10-CM | POA: Diagnosis not present

## 2018-05-30 DIAGNOSIS — K7031 Alcoholic cirrhosis of liver with ascites: Secondary | ICD-10-CM

## 2018-05-30 DIAGNOSIS — W19XXXA Unspecified fall, initial encounter: Secondary | ICD-10-CM | POA: Diagnosis not present

## 2018-05-30 NOTE — Assessment & Plan Note (Signed)
Multiple - with hx of cirrhosis and past etoh abuse Ref to PT for balance/gait training  inst to use walker at all times (not driving)

## 2018-05-30 NOTE — Telephone Encounter (Signed)
pts daughter Gwen(DPR signed) said pt was seen today and requested a handicap placard; pt did not realize until leaving office that she did not get handicap form. Gwen request cb.

## 2018-05-30 NOTE — Assessment & Plan Note (Signed)
Improved so far with lasix and spironolactone For GI f/u soon  Has not needed paracentesis so far

## 2018-05-30 NOTE — Progress Notes (Signed)
Subjective:    Patient ID: Christine Barton, female    DOB: 08-Jul-1950, 68 y.o.   MRN: [id number]  HPI Here with c/o weakness with fatigue  Poor endurance and balance  Falling more often   Up to 10 falls  Had a black eye from one fall  Has a walker- not using it   Thinks it is from her liver   No cold or cough or urine symptoms   Wt Readings from Last 3 Encounters:  05/30/18 120 lb 1 oz (54.5 kg)  05/16/18 127 lb (57.6 kg)  05/04/18 127 lb (57.6 kg)  has lost over 10 lb of fluid  19.98 kg/m   On furosemide and spironolactone for ascites/decompensated alcoholic cirrhosis  Also lactulose  Lasix 40 mg  Spironolactone 50 mg bid [place] con 40 meq daily   Smoking status  Getting ready to cut back her cigarettes  Breathing has not changed   Ascites - still bothersome   Had EGD on 2/4 She did have small esophageal varices and portal hypertensive gastropathy    etoh intake -- still none!  Not missing it  Doctor at [facility] recommended PT     Chemistry      Component Value Date/Time   NA 139 05/26/2018 1116   K 4.1 05/26/2018 1116   CL 102 05/26/2018 1116   CO2 28 05/26/2018 1116   BUN 9 05/26/2018 1116   CREATININE 0.87 05/26/2018 1116      Component Value Date/Time   CALCIUM 8.8 05/26/2018 1116   ALKPHOS 154 (H) 05/26/2018 1116   AST 33 05/26/2018 1116   ALT 11 05/26/2018 1116   BILITOT 2.8 (H) 05/26/2018 1116      Lab Results  Component Value Date   WBC 9.9 05/10/2018   HGB 11.3 (L) 05/10/2018   HCT 33.4 (L) 05/10/2018   MCV 97.6 05/10/2018   PLT 133.0 (L) 05/10/2018    She has GI appt Dr [name] 06/14/18  BP Readings from Last 3 Encounters:  05/30/18 (!) 124/56  05/16/18 (!) 137/58  05/04/18 124/70   Pulse Readings from Last 3 Encounters:  05/30/18 (!) 106  05/16/18 (!) 104  05/04/18 82   Still having some gallbladder pain  Not stable enough for surgery   Has not taken flexeril at all   Patient Active Problem List   Diagnosis Date  Noted  . Poor balance 05/30/2018  . Falls 05/30/2018  . Generalized weakness 05/30/2018  . Cirrhosis of liver ([place]) 04/24/2018  . Ascites 04/24/2018  . Gallstones 04/24/2018  . Occult blood positive stool 04/24/2018  . Hemoptysis 04/18/2018  . Abdominal pain 04/18/2018  . Screening mammogram, encounter for 11/07/2017  . Smoker 11/07/2017  . Screening examination for STD (sexually transmitted disease) 11/11/2016  . Welcome to Medicare preventive visit 10/26/2016  . Estrogen deficiency 10/26/2016  . Colon cancer screening 11/01/2014  . Elevated liver enzymes 11/01/2014  . Encounter for routine gynecological examination 09/12/2013  . Rapid heart beat 10/10/2012  . Heartburn 08/20/2011  . Routine general medical examination at a health care facility 07/21/2011  . Vitamin D deficiency 08/04/2009  . POSTMENOPAUSAL STATUS 08/04/2009  . Prediabetes 02/18/2009  . Hyperlipidemia 06/14/2008  . Advance DISEASE, CERVICAL 03/30/2007  . History of alcohol abuse 12/06/2006  . NEUROPATHY 12/06/2006  . DIVERTICULOSIS, COLON 12/06/2006  . Fatty liver 12/06/2006  . BREAST CANCER, HX OF 12/06/2006   Past Medical History:  Diagnosis Date  . Alcohol abuse, unspecified   . Breast  cancer ([facility]) 1998   Right  . Cataract    left eye  . Cervical spondylosis 2006   MRI  . Degeneration of cervical intervertebral disc 2006   MRI  . Diverticulosis of colon (without mention of hemorrhage)   . Hyperpotassemia   . Microscopic hematuria   . Mononeuritis of unspecified site   . Nonspecific abnormal results of liver function study   . Other abnormal glucose   . Other and unspecified hyperlipidemia    no per pt  . Other chronic nonalcoholic liver disease   . Personal history of chemotherapy   . Personal history of malignant neoplasm of breast   . Personal history of radiation therapy   . Pneumothorax, acute    right, spontaneous  . Tobacco use disorder   . Unspecified vitamin D deficiency    Past  Surgical History:  Procedure Laterality Date  . BREAST BIOPSY  9/03   Right  . BREAST LUMPECTOMY Right 1998  . BREAST LUMPECTOMY  1998  . CHEST TUBE INSERTION  11/02/2014  . COLONOSCOPY    . EYE SURGERY  02/2017   cataract extraction with lens implant  . TUBAL LIGATION     Social History   Tobacco Use  . Smoking status: Current Every Day Smoker    Packs/day: 1.00    Years: 53.00    Pack years: 53.00    Types: Cigarettes  . Smokeless tobacco: Never Used  . Tobacco comment: tobacco info given 04/27/2018  Substance Use Topics  . Alcohol use: Not Currently    Comment: occasional use  . Drug use: No   Family History  Problem Relation Age of Onset  . Heart failure Father   . Colon cancer Maternal Uncle   . Stroke Mother   . Esophageal cancer Neg Hx   . Rectal cancer Neg Hx   . Stomach cancer Neg Hx    Allergies  Allergen Reactions  . Oxycodone Nausea And Vomiting  . Glipizide Other (See Comments)    Stomach pain  . Metformin And Related Other (See Comments)    Stomach pain   Current Outpatient Medications on File Prior to Visit  Medication Sig Dispense Refill  . cyclobenzaprine (FLEXERIL) 10 MG tablet TAKE ONE TABLET BY MOUTH THREE TIMES DAILY AS NEEDED FOR MUSCLE SPASMS 90 tablet 0  . furosemide (LASIX) 20 MG tablet TAKE 2 TABLETS BY MOUTH EVERY DAY 180 tablet 1  . Multiple Vitamin (MULTIVITAMIN WITH MINERALS) TABS tablet Take 1 tablet by mouth daily.    . mupirocin ointment (BACTROBAN) 2 % APPLY A SMALL AMOUNT TO AFFECTED AREA ONINSIDE OF NOSE 2 TIMES DAILY AS DIRECTED 30 g 0  . pantoprazole (PROTONIX) 40 MG tablet Take 1 tablet (40 mg total) by mouth daily. 90 tablet 3  . polyethylene glycol (MIRALAX / GLYCOLAX) packet Take 17 g by mouth 2 (two) times daily.    . potassium chloride (KLOR-CON) 20 MEQ packet Take 40 mEq by mouth daily. 4 tablet 0  . spironolactone (ALDACTONE) 50 MG tablet Take 1 tablet (50 mg total) by mouth 2 (two) times daily. 60 tablet 3   No  current facility-administered medications on file prior to visit.     Review of Systems  Constitutional: Positive for fatigue. Negative for activity change, appetite change, diaphoresis, fever and unexpected weight change.       Generalized weakness   HENT: Negative for congestion, ear pain, rhinorrhea, sinus pressure and sore throat.   Eyes: Negative for pain, redness  and visual disturbance.  Respiratory: Negative for cough, chest tightness, shortness of breath and wheezing.   Cardiovascular: Negative for chest pain and palpitations.  Gastrointestinal: Positive for abdominal pain. Negative for anal bleeding, blood in stool, constipation, diarrhea, nausea, rectal pain and vomiting.       RUQ pain is improved   Endocrine: Negative for polydipsia and polyuria.  Genitourinary: Negative for dysuria, frequency and urgency.  Musculoskeletal: Positive for back pain and myalgias. Negative for arthralgias.  Skin: Negative for pallor and rash.  Allergic/Immunologic: Negative for environmental allergies.  Neurological: Positive for weakness. Negative for dizziness, tremors, seizures, syncope, facial asymmetry, speech difficulty, light-headedness, numbness and headaches.  Hematological: Negative for adenopathy. Does not bruise/bleed easily.  Psychiatric/Behavioral: Negative for decreased concentration and dysphoric mood. The patient is not nervous/anxious.        Objective:   Physical Exam Constitutional:      General: She is not in acute distress.    Appearance: She is well-developed and normal weight. She is not ill-appearing, toxic-appearing or diaphoretic.     Comments: Frail /chronically ill appearing   HENT:     Head: Normocephalic and atraumatic.     Mouth/Throat:     Mouth: Mucous membranes are moist.     Pharynx: Oropharynx is clear.  Eyes:     General: No scleral icterus.    Conjunctiva/sclera: Conjunctivae normal.     Pupils: Pupils are equal, round, and reactive to light.      Comments: No icterus today  Neck:     Musculoskeletal: Normal range of motion and neck supple. No neck rigidity.     Thyroid: No thyromegaly.     Vascular: No carotid bruit or JVD.  Cardiovascular:     Rate and Rhythm: Regular rhythm. Tachycardia present.     Heart sounds: Normal heart sounds. No gallop.   Pulmonary:     Effort: Pulmonary effort is normal. No respiratory distress.     Breath sounds: Normal breath sounds. No wheezing or rales.     Comments: Diffusely distant bs  Abdominal:     General: Abdomen is protuberant. Bowel sounds are normal. There is no distension or abdominal bruit.     Palpations: Abdomen is soft. There is fluid wave and hepatomegaly. There is no mass.     Tenderness: There is abdominal tenderness in the right upper quadrant and epigastric area. Negative signs include Murphy's sign.     Comments: Ascites is slightly improved   Musculoskeletal:     Right lower leg: No edema.     Left lower leg: No edema.  Lymphadenopathy:     Cervical: No cervical adenopathy.  Skin:    General: Skin is warm and dry.     Coloration: Skin is not pale.     Findings: No rash.     Comments: Rosacea  telangectasia diffusely  Neurological:     Mental Status: She is alert.     Sensory: No sensory deficit.     Coordination: Coordination abnormal.     Deep Tendon Reflexes: Reflexes are normal and symmetric. Reflexes normal.     Comments: No tremor  Poor balance  Guarded gait   Psychiatric:        Mood and Affect: Mood normal.           Assessment & Plan:   Problem List Items Addressed This Visit      Digestive   Cirrhosis of liver (HCC)    Alcoholic cirrhosis-followed by GI Significant ascites controlled  with lasix/spironolactone so far / for f/u soon (has not had paracentesis)  Portal HTN noted with mild varices and gastropathy on EGD Pt remains in weakened state and quite deconditioned       Gallstones    Has been seen at [facility] Not stable enough with cirrhosis  /ascites to undergo surgery yet Pain overall is improved some        Other   History of alcohol abuse    Now quit since dec in setting of symptomatic cirrhosis  Commended on quitting  Continues GI care       Smoker    Disc in detail risks of smoking and possible outcomes including copd, vascular/ heart disease, cancer , respiratory and sinus infections  Pt voices understanding Not ready to quit but per pt she is thinking about it  No further hemoptysis  No resp symptoms       Ascites    Improved so far with lasix and spironolactone For GI f/u soon  Has not needed paracentesis so far      Poor balance - Primary    Presumed from combination of chronic dz (cirrhosis), deconditioning, age and past alcohol abuse  Multiple falls  Ref to PT for balance/gait training  Adv strongly to use walker for the time being       Relevant Orders   Ambulatory referral to Physical Therapy   Falls    Multiple - with hx of cirrhosis and past etoh abuse Ref to PT for balance/gait training  inst to use walker at all times (not driving)        Relevant Orders   Ambulatory referral to Physical Therapy   Generalized weakness    Chronically ill with alcoholic cirrhosis - now getting stabilized  Interested in PT for balance and strength -ref done  inst to use walker at this point until she gets stronger       Relevant Orders   Ambulatory referral to Physical Therapy

## 2018-05-30 NOTE — Assessment & Plan Note (Signed)
Disc in detail risks of smoking and possible outcomes including copd, vascular/ heart disease, cancer , respiratory and sinus infections  Pt voices understanding Not ready to quit but per pt she is thinking about it  No further hemoptysis  No resp symptoms

## 2018-05-30 NOTE — Assessment & Plan Note (Signed)
Chronically ill with alcoholic cirrhosis - now getting stabilized  Interested in PT for balance and strength -ref done  inst to use walker at this point until she gets stronger

## 2018-05-30 NOTE — Assessment & Plan Note (Signed)
Now quit since dec in setting of symptomatic cirrhosis  Commended on quitting  Continues GI care

## 2018-05-30 NOTE — Telephone Encounter (Signed)
Daughter notified form ready for pick up

## 2018-05-30 NOTE — Telephone Encounter (Signed)
I put it in IN box

## 2018-05-30 NOTE — Assessment & Plan Note (Signed)
Alcoholic cirrhosis-followed by GI Significant ascites controlled with lasix/spironolactone so far / for f/u soon (has not had paracentesis)  Portal HTN noted with mild varices and gastropathy on EGD Pt remains in weakened state and quite deconditioned

## 2018-05-30 NOTE — Assessment & Plan Note (Signed)
Presumed from combination of chronic dz (cirrhosis), deconditioning, age and past alcohol abuse  Multiple falls  Ref to PT for balance/gait training  Adv strongly to use walker for the time being

## 2018-05-30 NOTE — Patient Instructions (Signed)
Continue to avoid alcohol  Talk to GI about tylenol intake  Eat low sodium diet   We will refer you to PT to help with strength and balance so you stop falling   Take care of yourself - use the walker to prevent falls for now

## 2018-05-30 NOTE — Assessment & Plan Note (Signed)
Has been seen at [facility] Not stable enough with cirrhosis /ascites to undergo surgery yet Pain overall is improved some

## 2018-06-05 ENCOUNTER — Encounter: Payer: Self-pay | Admitting: Family Medicine

## 2018-06-06 ENCOUNTER — Telehealth: Payer: Self-pay | Admitting: *Deleted

## 2018-06-06 NOTE — Telephone Encounter (Signed)
Gwen notified of Dr. Marliss Coots comments and verbalized understanding. 2 copies of Advance directive left at the front for daughter to pick up

## 2018-06-06 NOTE — Telephone Encounter (Signed)
Patient's daughter Meredith Mody called stating that she needs some guidance. Christine Barton stated that her mom is in such bad shape that she needs to see about getting paperwork done and taken care of for POA, living will and other forms that may need to be taken care of because of her mom's condition.  Christine Barton requested a call back about getting the forms that she needs to get this started.

## 2018-06-06 NOTE — Telephone Encounter (Signed)
Please give her the blue booklet for advance directive - that is the easiest way to go about it and they can complete it together , get it notarized (without the need of a lawyer)  That is a good idea  Get [place] a copy when it is done

## 2018-06-07 ENCOUNTER — Other Ambulatory Visit: Payer: Self-pay | Admitting: Family Medicine

## 2018-06-07 NOTE — Telephone Encounter (Signed)
I am apprehensive to fill this as it can worsen balance and increase fall risk (which she has lately) Please let me know how she is doing  I know she was waiting to get set up with PT to help

## 2018-06-07 NOTE — Telephone Encounter (Signed)
Last OV was 05/30/18 last filled on 04/21/18 #90 tabs with 0 refills

## 2018-06-07 NOTE — Telephone Encounter (Signed)
**  PT WOULD LIKE DR. TOWER TO CALL HER BACK ON HOME [phone] TO DISCUSS THIS AND OTHER QUESTIONS SHE HAS**   I did ask pt is there a message I can relay to Dr. Glori Bickers before she calls her back and she said she wants to discuss the flexeril an other questions. Pt said she only takes one flexeril daily so she did want it filled of okay with Dr. Glori Bickers. Also pt asked if her falling coming from her liver and will it always be there, and is this something she is going to have to deal with forever. Also pt said Dr. Glori Bickers took her ability away to drive for now and she wanted to know is that a permanent thing or will she be able to drive in the future. Pt is requesting a call back for Dr. Glori Bickers to discuss her concerns

## 2018-06-07 NOTE — Telephone Encounter (Signed)
Discussed with pt  Will limit to 1/2 pill as bedtime if absolutely needed (in light of liver fxn and also sedation/fall potential)  She asked if she could take ibuprofen occ for pain  I recommended using minimally/small dose with food only when absolutely needed

## 2018-06-14 ENCOUNTER — Ambulatory Visit: Payer: Medicare HMO | Admitting: Gastroenterology

## 2018-06-14 DIAGNOSIS — R2681 Unsteadiness on feet: Secondary | ICD-10-CM | POA: Diagnosis not present

## 2018-06-14 DIAGNOSIS — R269 Unspecified abnormalities of gait and mobility: Secondary | ICD-10-CM | POA: Diagnosis not present

## 2018-06-21 DIAGNOSIS — R2681 Unsteadiness on feet: Secondary | ICD-10-CM | POA: Diagnosis not present

## 2018-06-21 DIAGNOSIS — R269 Unspecified abnormalities of gait and mobility: Secondary | ICD-10-CM | POA: Diagnosis not present

## 2018-06-26 ENCOUNTER — Telehealth: Payer: Self-pay | Admitting: Family Medicine

## 2018-06-26 NOTE — Telephone Encounter (Signed)
Please talk to her daughter and get her take on how she is doing/whether she thinks she is strong enough to drive (she will be honest with you)-let me know  I really think it would be better for a family member to go to GI with her given there will be a lot of info shared and 2 heads are better than one for questions and info Thanks

## 2018-06-26 NOTE — Telephone Encounter (Signed)
Glad to hear that she is feeling better  How is her strength? Is she dizzy? Did physical therapy clear her to drive?

## 2018-06-26 NOTE — Telephone Encounter (Signed)
Pt notified of Dr. Marliss Coots comments. Pt then wanted me to talk to her daughter because "she knows what's going on better with pt" I advised pt that Dr. Glori Bickers would like [name] recommendations since they are certified and have been working with her. Pt became very silent on the phone then told me that she has a doctor's appt scheduled for Wednesday (day before her PT) and the reason she was asking Dr. Glori Bickers if she could drive is because she wants to drive to work and then to her GI appt on 06/28/18, pt wants Dr. Marliss Coots okay to drive on that day

## 2018-06-26 NOTE — Telephone Encounter (Signed)
Do PT as planned on Thursday- please ask them (physical therapist) to reach out to me regarding whether they think she is strong enough to drive safely  So glad to hear she is doing better!

## 2018-06-26 NOTE — Telephone Encounter (Signed)
Pt said she feels back to herself. She has no dizziness, her strength is back, she hasn't had anymore more falls, she doesn't need help to walk anymore, and her appetite is back. Pt said she didn't think to ask PT about driving so just asked [place] but she said she goes back on Thursday to PT but she did let me know that they said she is doing really well and has improved significantly

## 2018-06-26 NOTE — Telephone Encounter (Signed)
Pt want to know when can she start to drive again because her symptoms are gone. Please advise

## 2018-06-27 ENCOUNTER — Telehealth: Payer: Self-pay

## 2018-06-27 NOTE — Telephone Encounter (Signed)
Spoke with Daughter and she said that pt has improved significantly. She said that the PT has helped and pt is not using a cane at all she's walking very good and she isn't falling anymore. Daughter thinks it may have also helped that pt's appetite is back and she is eating very well too. Pt is still losing weight but she is eating so daughter thinks it may be from the fluid. Daughter said that due to work schedules that no one can drive her tomorrow and they wanted her to r/s the appt but pt is just so nervous about going she didn't want to r/s because it may be a few more weeks before she could be seen. Daughter said for right now pt is doing well and she thinks she is okay to drive. Daughter's only concern is that she isn't sure if this improvement is permanent or if pt will decline again in the future but for tomorrow she thinks she's stable enough to drive

## 2018-06-27 NOTE — Telephone Encounter (Signed)
Pt notified of Dr. Marliss Coots comments and verbalized understanding

## 2018-06-27 NOTE — Telephone Encounter (Signed)
It sounds like she can drive to her appt -that is good news Make sure she takes a pad and pen to write down what they tell her as she is apt to forget  Thanks

## 2018-06-27 NOTE — Telephone Encounter (Signed)
Covid-19 travel screening questions  Have you traveled in the last 14 days? NO If yes where?  Do you now or have you had a fever in the last 14 days? NO  Do you have any respiratory symptoms of shortness of breath or cough now or in the last 14 days? NO    Do you have any family members or close contacts with diagnosed or suspected Covid-19? NO

## 2018-06-28 ENCOUNTER — Other Ambulatory Visit (INDEPENDENT_AMBULATORY_CARE_PROVIDER_SITE_OTHER): Payer: Medicare HMO

## 2018-06-28 ENCOUNTER — Encounter: Payer: Self-pay | Admitting: Gastroenterology

## 2018-06-28 ENCOUNTER — Ambulatory Visit: Payer: Medicare HMO | Admitting: Gastroenterology

## 2018-06-28 ENCOUNTER — Other Ambulatory Visit: Payer: Self-pay

## 2018-06-28 VITALS — BP 110/60 | HR 109 | Temp 98.8°F | Ht 64.0 in | Wt 108.2 lb

## 2018-06-28 DIAGNOSIS — K7469 Other cirrhosis of liver: Secondary | ICD-10-CM | POA: Diagnosis not present

## 2018-06-28 DIAGNOSIS — E43 Unspecified severe protein-calorie malnutrition: Secondary | ICD-10-CM

## 2018-06-28 DIAGNOSIS — K7031 Alcoholic cirrhosis of liver with ascites: Secondary | ICD-10-CM | POA: Diagnosis not present

## 2018-06-28 LAB — CBC WITH DIFFERENTIAL/PLATELET
Basophils Absolute: 0 10*3/uL (ref 0.0–0.1)
Basophils Relative: 0.5 % (ref 0.0–3.0)
Eosinophils Absolute: 0.1 10*3/uL (ref 0.0–0.7)
Eosinophils Relative: 1.1 % (ref 0.0–5.0)
HCT: 31.9 % — ABNORMAL LOW (ref 36.0–46.0)
Hemoglobin: 10.8 g/dL — ABNORMAL LOW (ref 12.0–15.0)
Lymphocytes Relative: 20.7 % (ref 12.0–46.0)
Lymphs Abs: 1.9 10*3/uL (ref 0.7–4.0)
MCHC: 34 g/dL (ref 30.0–36.0)
MCV: 96.5 fl (ref 78.0–100.0)
Monocytes Absolute: 0.6 10*3/uL (ref 0.1–1.0)
Monocytes Relative: 6.7 % (ref 3.0–12.0)
Neutro Abs: 6.5 10*3/uL (ref 1.4–7.7)
Neutrophils Relative %: 71 % (ref 43.0–77.0)
Platelets: 254 10*3/uL (ref 150.0–400.0)
RBC: 3.3 Mil/uL — ABNORMAL LOW (ref 3.87–5.11)
RDW: 17.6 % — ABNORMAL HIGH (ref 11.5–15.5)
WBC: 9.2 10*3/uL (ref 4.0–10.5)

## 2018-06-28 LAB — COMPREHENSIVE METABOLIC PANEL
ALT: 11 U/L (ref 0–35)
AST: 32 U/L (ref 0–37)
Albumin: 2.9 g/dL — ABNORMAL LOW (ref 3.5–5.2)
Alkaline Phosphatase: 150 U/L — ABNORMAL HIGH (ref 39–117)
BUN: 10 mg/dL (ref 6–23)
CO2: 27 mEq/L (ref 19–32)
Calcium: 9.6 mg/dL (ref 8.4–10.5)
Chloride: 99 mEq/L (ref 96–112)
Creatinine, Ser: 0.83 mg/dL (ref 0.40–1.20)
GFR: 68.46 mL/min (ref >60.00)
Glucose, Bld: 225 mg/dL — ABNORMAL HIGH (ref 70–99)
Potassium: 4.7 mEq/L (ref 3.5–5.1)
Sodium: 132 mEq/L — ABNORMAL LOW (ref 135–145)
Total Bilirubin: 1.2 mg/dL (ref 0.2–1.2)
Total Protein: 7.6 g/dL (ref 6.0–8.3)

## 2018-06-28 LAB — PROTIME-INR
INR: 1.3 ratio — ABNORMAL HIGH (ref 0.8–1.0)
Prothrombin Time: 15.6 s — ABNORMAL HIGH (ref 9.6–13.1)

## 2018-06-28 MED ORDER — SPIRONOLACTONE 50 MG PO TABS: 50.0000 mg | ORAL_TABLET | Freq: Two times a day (BID) | ORAL | 3 refills | Status: DC

## 2018-06-28 MED ORDER — LACTULOSE 10 GM/15ML PO SOLN: 10.0000 g | Freq: Three times a day (TID) | ORAL | 11 refills | Status: DC

## 2018-06-28 MED ORDER — FUROSEMIDE 20 MG PO TABS: 40.0000 mg | ORAL_TABLET | Freq: Every day | ORAL | 3 refills | Status: DC

## 2018-06-28 NOTE — Patient Instructions (Signed)
Go to the basement for labs today  We have refilled your Lasix and Aldactone  We have sent a new prescription of lactulose to your pharmacy  Follow up in 3 months  I appreciate the  opportunity to care for you  Thank You   Harl Bowie , MD   To help prevent the possible spread of infection to our patients, communities, and staff; we will be implementing the following measures:  Please only allow one visitor/family member to accompany you to any upcoming appointments with [facility]. If you have any concerns about this please contact our office to discuss prior to the appointment.

## 2018-06-28 NOTE — Progress Notes (Signed)
Christine Barton    [id number]    Mar 22, 1951  Primary Care Physician:Tower, [name], MD  Referring Physician: Tower, [name], MD [place], [place] [address]  Chief complaint:  Cirrhosis  HPI:  68 yr F with cirrhosis, alcohol induced here for follow-up visit. She is doing better overall, denies any abdominal pain, nausea, vomiting, blood in stool or change in bowel habits. She stopped taking lactulose 3 days ago as she is having regular bowel movements but according to her daughter she is more tired and is also having imbalance in her gait in the past 2 days.  She was not exactly sure why lactulose was prescribed to her, thought it was for constipation. She denies drinking any alcohol in the past 3 months.  She had an episode of dark stool 2 weeks ago but has not had any recently.   She is taking Aldactone and furosemide with improvement of abdominal distention.  Is continuing to lose weight down to 108 pounds today.  She is eating 3 meals and also drinking Glucerna.     Outpatient Encounter Medications as of 06/28/2018  Medication Sig  . cyclobenzaprine (FLEXERIL) 10 MG tablet Take 0.5 tablets (5 mg total) by mouth at bedtime as needed for muscle spasms.  . furosemide (LASIX) 20 MG tablet TAKE 2 TABLETS BY MOUTH EVERY DAY  . Multiple Vitamin (MULTIVITAMIN WITH MINERALS) TABS tablet Take 1 tablet by mouth daily.  . mupirocin ointment (BACTROBAN) 2 % APPLY A SMALL AMOUNT TO AFFECTED AREA ONINSIDE OF NOSE 2 TIMES DAILY AS DIRECTED  . pantoprazole (PROTONIX) 40 MG tablet Take 1 tablet (40 mg total) by mouth daily.  . polyethylene glycol (MIRALAX / GLYCOLAX) packet Take 17 g by mouth 2 (two) times daily.  . potassium chloride (KLOR-CON) 20 MEQ packet Take 40 mEq by mouth daily.  Marland Kitchen spironolactone (ALDACTONE) 50 MG tablet Take 1 tablet (50 mg total) by mouth 2 (two) times daily.   No facility-administered encounter medications on file as of 06/28/2018.      Allergies as of 06/28/2018 - Review Complete 06/28/2018  Allergen Reaction Noted  . Oxycodone Nausea And Vomiting 11/02/2014  . Glipizide Other (See Comments) 09/12/2013  . Metformin and related Other (See Comments) 02/02/2013    Past Medical History:  Diagnosis Date  . Alcohol abuse, unspecified   . Breast cancer ([place]) 1998   Right  . Cataract    left eye  . Cervical spondylosis 2006   MRI  . Degeneration of cervical intervertebral disc 2006   MRI  . Diverticulosis of colon (without mention of hemorrhage)   . Hyperpotassemia   . Microscopic hematuria   . Mononeuritis of unspecified site   . Nonspecific abnormal results of liver function study   . Other abnormal glucose   . Other and unspecified hyperlipidemia    no per pt  . Other chronic nonalcoholic liver disease   . Personal history of chemotherapy   . Personal history of malignant neoplasm of breast   . Personal history of radiation therapy   . Pneumothorax, acute    right, spontaneous  . Tobacco use disorder   . Unspecified vitamin D deficiency     Past Surgical History:  Procedure Laterality Date  . BREAST BIOPSY  9/03   Right  . BREAST LUMPECTOMY Right 1998  . BREAST LUMPECTOMY  1998  . CHEST TUBE INSERTION  11/02/2014  . COLONOSCOPY    .  EYE SURGERY  02/2017   cataract extraction with lens implant  . TUBAL LIGATION      Family History  Problem Relation Age of Onset  . Heart failure Father   . Colon cancer Maternal Uncle   . Stroke Mother   . Esophageal cancer Neg Hx   . Rectal cancer Neg Hx   . Stomach cancer Neg Hx     Social History   Socioeconomic History  . Marital status: Single    Spouse name: Not on file  . Number of children: 1  . Years of education: Not on file  . Highest education level: Not on file  Occupational History  . Occupation: retired    Fish farm manager: [employer]  Social Needs  . Financial resource strain: Not on file  . Food insecurity:    Worry: Not on file     Inability: Not on file  . Transportation needs:    Medical: Not on file    Non-medical: Not on file  Tobacco Use  . Smoking status: Current Every Day Smoker    Packs/day: 1.00    Years: 53.00    Pack years: 53.00    Types: Cigarettes  . Smokeless tobacco: Never Used  . Tobacco comment: tobacco info given 68/16/2020  Substance and Sexual Activity  . Alcohol use: Not Currently    Comment: occasional use  . Drug use: No  . Sexual activity: Not on file  Lifestyle  . Physical activity:    Days per week: Not on file    Minutes per session: Not on file  . Stress: Not on file  Relationships  . Social connections:    Talks on phone: Not on file    Gets together: Not on file    Attends religious service: Not on file    Active member of club or organization: Not on file    Attends meetings of clubs or organizations: Not on file    Relationship status: Not on file  . Intimate partner violence:    Fear of current or ex partner: Not on file    Emotionally abused: Not on file    Physically abused: Not on file    Forced sexual activity: Not on file  Other Topics Concern  . Not on file  Social History Narrative   Divorced      1 child      Works at [employer] of systems: Review of Systems  Constitutional: Negative for fever and chills.  Positive for fatigue HENT: Negative.   Eyes: Negative for blurred vision.  Respiratory: Negative for cough, shortness of breath and wheezing.   Cardiovascular: Negative for chest pain and palpitations.  Gastrointestinal: as per HPI Genitourinary: Negative for dysuria, urgency, frequency and hematuria.  Musculoskeletal: Negative for myalgias, back pain and joint pain.  Skin: Negative for itching and rash.  Neurological: Negative for dizziness, tremors, focal weakness, seizures and loss of consciousness.  Positive for change in gait and balance Endo/Heme/Allergies: Negative for seasonal allergies.   Psychiatric/Behavioral: Negative for depression, suicidal ideas and hallucinations.  All other systems reviewed and are negative.   Physical Exam: Vitals:   06/28/18 0948  BP: 110/60  Pulse: (!) 109  Temp: 98.8 F (37.1 C)   Body mass index is 18.58 kg/m. Gen:      No acute distress HEENT:  EOMI, sclera anicteric Neck:     No masses; no thyromegaly Lungs:  Clear to auscultation bilaterally; normal respiratory effort CV:         Regular rate and rhythm; no murmurs Abd:      + bowel sounds; soft, non-tender; no palpable masses, no distension Ext:    No edema; adequate peripheral perfusion Skin:      Warm and dry; no rash Neuro: alert and oriented x 3 Psych: normal mood and affect  Data Reviewed:  Reviewed labs, radiology imaging, old records and pertinent past GI work up   Assessment and Plan/Recommendations:  68 year old female with history of breast cancer, diabetes, nonalcoholic steatohepatitis and alcohol induced cirrhosis decompensated with ascites Check CBC, LFT, PT and INR  Ascites: Continue Lasix and Aldactone Follow-up BMP  HCC  screening: Abdominal ultrasound January 2020 negative for any focal liver lesions  Esophageal varices small grade 1 based on endoscopy February 2020  Hepatic encephalopathy: Advised patient to take lactulose 1 to 2 tablespoon twice daily with goal 2-3 soft bowel movements.  Instructed family member to give her an additional dose if noticed any mood changes, change in sleep pattern, gait changes or confusion. Also advised patient and family member to call if needed with any change in clinical status  Gallstones on abdominal ultrasound but she is currently asymptomatic.  Overall higher risk with cholecystectomy, continue to monitor.  Discussed alcohol cessation, diet.  Advised patient to avoid simple sugars, soda and fruit juices. Small frequent meals with high-protein content  Damaris Hippo , MD [phone]    CC: Tower,  [name], MD

## 2018-07-03 ENCOUNTER — Telehealth: Payer: Self-pay | Admitting: Family Medicine

## 2018-07-03 NOTE — Telephone Encounter (Signed)
If she feels good and her daughter thinks it is ok -she is fine to drive

## 2018-07-03 NOTE — Telephone Encounter (Signed)
Pt notified of Dr. Marliss Coots comments

## 2018-07-03 NOTE — Telephone Encounter (Signed)
best number  [phone]  Pt would like you to call her when you get a chance.  She has seen all the doctors she needed to see and wants dr tower to approve her to drive

## 2018-07-10 ENCOUNTER — Telehealth: Payer: Self-pay | Admitting: Family Medicine

## 2018-07-10 ENCOUNTER — Telehealth: Payer: Self-pay

## 2018-07-10 NOTE — Telephone Encounter (Signed)
Pt said started lactulose about one month ago and since then prior to having a BM pt has lower abd pain all the way across abd (pain level 9-10). After BM pain eases off and this occurs prior to every BM for the last month. Pt also has a lot of gas. pts BMs are almost regulated by to normal.Dr Tower said for pt to contact GI. Pt voiced understanding and will call Dr Nyoka Cowden. FYI to Dr Glori Bickers.

## 2018-07-10 NOTE — Telephone Encounter (Signed)
Routing to Triage nurse

## 2018-07-10 NOTE — Telephone Encounter (Signed)
Lactulose can cause cramping-adv her to check in with her GI (treating for liver issues)

## 2018-07-10 NOTE — Telephone Encounter (Signed)
Pt need to speak to nurse concerning pain that she is having in the lower part of her stomach when she has to void. Please call pt

## 2018-07-11 NOTE — Telephone Encounter (Signed)
Reports pain with bowel movements. The pain is in her low abdomen "like the pain you get when you need to go to the bathroom." She has 2 to 3 bowel movements a day. Has had to use a suppository with the bowel movements. No loose stools.  She is taking Lactulose 10 gr daily.

## 2018-07-11 NOTE — Telephone Encounter (Signed)
Lower abdominal cramping could be due to excessive gas and bloating from lactulose. Please advise patient to split the lactulose dose and take 5cc twice daily . Take additional dose of lactulose if doesn't have 2-3 BM per day.

## 2018-07-11 NOTE — Telephone Encounter (Signed)
Explained this to the patient. Asked she let us know if this does not help or if she acutely worsens.

## 2018-07-26 DIAGNOSIS — K7031 Alcoholic cirrhosis of liver with ascites: Secondary | ICD-10-CM | POA: Diagnosis not present

## 2018-07-26 DIAGNOSIS — E41 Nutritional marasmus: Secondary | ICD-10-CM | POA: Insufficient documentation

## 2018-08-02 ENCOUNTER — Other Ambulatory Visit: Payer: Self-pay | Admitting: Gastroenterology

## 2018-08-02 DIAGNOSIS — K7031 Alcoholic cirrhosis of liver with ascites: Secondary | ICD-10-CM

## 2018-08-09 ENCOUNTER — Telehealth: Payer: Self-pay | Admitting: Family Medicine

## 2018-08-09 NOTE — Telephone Encounter (Signed)
error 

## 2018-08-10 ENCOUNTER — Other Ambulatory Visit: Payer: Self-pay | Admitting: Family Medicine

## 2018-08-10 NOTE — Telephone Encounter (Signed)
Last OV 05/30/2018  Cyclobenzaprine last refilled   30 tablet 0 06/07/2018     Muprirocin last refilled  30 g 0 10/31/2017    Next OV 11/07/2018   Sent to PCP for approval

## 2018-08-12 ENCOUNTER — Telehealth: Payer: Self-pay

## 2018-08-12 NOTE — Telephone Encounter (Signed)
Pt left a message on Triage VM transferred from [place] stating she has a tick on her and wanted it removed. I advised her we were not seeing pts in the office today. I suggested [facility] or UC. She said she was going to get a friend to take it off. I advised her to keep it clean with soap and water and neosporin. Advised her to look out of symptoms of a really bad headache, rash on palm of hands or soles of feet, bullseye rash at the bite, or any flu-like symptoms. She will let us know if she is feeling bad.

## 2018-08-14 ENCOUNTER — Encounter: Payer: Self-pay | Admitting: Family Medicine

## 2018-08-14 ENCOUNTER — Ambulatory Visit (INDEPENDENT_AMBULATORY_CARE_PROVIDER_SITE_OTHER): Payer: Medicare HMO | Admitting: Family Medicine

## 2018-08-14 ENCOUNTER — Other Ambulatory Visit: Payer: Self-pay

## 2018-08-14 VITALS — BP 124/60 | HR 107 | Temp 98.0°F | Ht 64.0 in | Wt 109.1 lb

## 2018-08-14 DIAGNOSIS — S1096XA Insect bite of unspecified part of neck, initial encounter: Secondary | ICD-10-CM

## 2018-08-14 DIAGNOSIS — S40862A Insect bite (nonvenomous) of left upper arm, initial encounter: Secondary | ICD-10-CM

## 2018-08-14 DIAGNOSIS — W57XXXA Bitten or stung by nonvenomous insect and other nonvenomous arthropods, initial encounter: Secondary | ICD-10-CM

## 2018-08-14 DIAGNOSIS — F17200 Nicotine dependence, unspecified, uncomplicated: Secondary | ICD-10-CM

## 2018-08-14 NOTE — Telephone Encounter (Signed)
Client [place] Night - Client Client Site [place] - Night Contact Type Call Who Is Calling Patient / Member / Family / Caregiver Caller Name Williamson Phone Number declined Patient Name Christine Barton Patient DOB 2051/04/12 Call Type Message Only Information Provided Reason for Call Request to Schedule Office Appointment Initial Comment Caller states she has a tick on her arm and she can't get it out, asking if office can help her get it out. Transferred to office Additional Comment Call Closed By: Jearld Shines Transaction Date/Time: 08/12/2018 9:01:47 AM (ET)

## 2018-08-14 NOTE — Assessment & Plan Note (Signed)
No changes in smoking status

## 2018-08-14 NOTE — Telephone Encounter (Signed)
Spoke with patient to follow up on tick bites.    She did not have them evaluated this weekend and feels like the head of (now 2 ticks) were left under the skin.   She feels fine but is unsure what the areas look like as they are in hard to reach areas and would like for Dr. Glori Bickers to check her out.  They are not irritating to her but she is unsure if they are red and irritated. She feels like the head was left in.   She screened negative of COVID concerning symptoms and has been set up in the office for 3:30pm today with Dr. Glori Bickers to evaluate.

## 2018-08-14 NOTE — Assessment & Plan Note (Signed)
2 bites- L neck and L underarm  3rd area was just a scab from scratching a mole Per pt -not engorged ticks / bigger than deer ticks Tick part removed from 2nd bite w/o incident  Adv cleaning areas with soap and water Watch for target shaped lesion or rash or fever/malaise (pt voiced understanding)

## 2018-08-14 NOTE — Patient Instructions (Signed)
Keep the tick bite areas clean and dry (soap and water)  If you develop a bulleye shape to the bites (like a target) please let me know  If any rash or fever - call and let us know

## 2018-08-14 NOTE — Telephone Encounter (Signed)
I wil see her then

## 2018-08-14 NOTE — Progress Notes (Signed)
Subjective:    Patient ID: Christine Barton, female    DOB: 1950/05/18, 68 y.o.   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 Tick bites 08/14/2018  . Poor balance 05/30/2018  . Falls 05/30/2018  . Generalized weakness 05/30/2018  . Cirrhosis of liver ([place]) 04/24/2018  . Ascites 04/24/2018  . Gallstones 04/24/2018  . Occult blood positive stool 04/24/2018  . Abdominal pain 04/18/2018  . Screening mammogram, encounter for 11/07/2017  . Smoker 11/07/2017  . Screening examination for STD (sexually transmitted disease) 11/11/2016  . Welcome to Medicare preventive visit 10/26/2016  . Estrogen deficiency 10/26/2016  . Colon cancer screening 11/01/2014  . Elevated liver enzymes 11/01/2014  . Encounter for routine gynecological examination 09/12/2013  . Rapid heart beat 10/10/2012  . Heartburn 08/20/2011  . Routine general medical examination at a health care facility 07/21/2011  . Vitamin D deficiency 08/04/2009  . POSTMENOPAUSAL STATUS 08/04/2009  . Prediabetes 02/18/2009  . Hyperlipidemia 06/14/2008  . [place] DISEASE, CERVICAL 03/30/2007  . History of alcohol abuse 12/06/2006  . NEUROPATHY 12/06/2006  . DIVERTICULOSIS, COLON 12/06/2006  . Fatty liver 12/06/2006  . BREAST CANCER, HX OF 12/06/2006   Past Medical History:  Diagnosis Date  . Alcohol abuse, unspecified   . Breast cancer ([place]) 1998   Right  . Cataract    left eye  . Cervical spondylosis 2006   MRI  . Degeneration of cervical intervertebral disc 2006   MRI  . Diverticulosis of colon (without mention of hemorrhage)   . Hyperpotassemia   . Microscopic hematuria   . Mononeuritis of unspecified site   . Nonspecific abnormal results of liver function study   . Other  abnormal glucose   . Other and unspecified hyperlipidemia    no per pt  . Other chronic nonalcoholic liver disease   . Personal history of chemotherapy   . Personal history of malignant neoplasm of breast   . Personal history of radiation therapy   . Pneumothorax, acute    right, spontaneous  . Tobacco use disorder   . Unspecified vitamin D deficiency    Past Surgical History:  Procedure Laterality Date  . BREAST BIOPSY  9/03   Right  . BREAST LUMPECTOMY Right 1998  . BREAST LUMPECTOMY  1998  . CHEST TUBE INSERTION  11/02/2014  . COLONOSCOPY    . EYE SURGERY  02/2017   cataract extraction with lens implant  . TUBAL LIGATION     Social History   Tobacco Use  . Smoking status: Current Every Day Smoker    Packs/day: 1.00    Years: 53.00    Pack years: 53.00    Types: Cigarettes  . Smokeless tobacco: Never Used  . Tobacco comment: tobacco info given 04/27/2018  Substance Use Topics  . Alcohol use: Not Currently    Comment: occasional use  . Drug use: No   Family History  Problem Relation Age of Onset  . Heart failure Father   . Colon cancer Maternal Uncle   . Stroke Mother   . Esophageal cancer Neg Hx   . Rectal cancer Neg Hx   . Stomach cancer  Neg Hx    Allergies  Allergen Reactions  . Oxycodone Nausea And Vomiting  . Glipizide Other (See Comments)    Stomach pain  . Metformin And Related Other (See Comments)    Stomach pain   Current Outpatient Medications on File Prior to Visit  Medication Sig Dispense Refill  . cyclobenzaprine (FLEXERIL) 10 MG tablet TAKE 0.5 TABLETS (5 MG TOTAL) BY MOUTH AT BEDTIME AS NEEDED FOR MUSCLE SPASMS. 30 tablet 0  . furosemide (LASIX) 20 MG tablet Take 2 tablets (40 mg total) by mouth daily. 180 tablet 3  . lactulose (CHRONULAC) 10 GM/15ML solution Take 15 mLs (10 g total) by mouth 3 (three) times daily. 236 mL 11  . Multiple Vitamin (MULTIVITAMIN WITH MINERALS) TABS tablet Take 1 tablet by mouth daily.    . mupirocin ointment  (BACTROBAN) 2 % APPLY A SMALL AMOUNT TO AFFECTED AREA ON INSIDE OF NOSE 2 TIMES DAILY AS DIRECTED 22 g 1  . pantoprazole (PROTONIX) 40 MG tablet Take 1 tablet (40 mg total) by mouth daily. 90 tablet 3  . potassium chloride (KLOR-CON) 20 MEQ packet Take 40 mEq by mouth daily. 4 tablet 0  . spironolactone (ALDACTONE) 50 MG tablet Take 1 tablet (50 mg total) by mouth 2 (two) times daily. (Patient not taking: Reported on 08/14/2018) 180 tablet 3   No current facility-administered medications on file prior to visit.     Review of Systems  Constitutional: Positive for fatigue. Negative for activity change, appetite change, fever and unexpected weight change.       Baseline fatigue -improved from previous   HENT: Negative for congestion, ear pain, rhinorrhea, sinus pressure and sore throat.   Eyes: Negative for pain, redness and visual disturbance.  Respiratory: Negative for cough, shortness of breath and wheezing.   Cardiovascular: Negative for chest pain and palpitations.  Gastrointestinal: Negative for abdominal pain, blood in stool, constipation and diarrhea.  Endocrine: Negative for polydipsia and polyuria.  Genitourinary: Negative for dysuria, frequency and urgency.  Musculoskeletal: Negative for arthralgias, back pain and myalgias.  Skin: Negative for pallor, rash and wound.       Tick bites  Allergic/Immunologic: Negative for environmental allergies.  Neurological: Negative for dizziness, syncope and headaches.  Hematological: Negative for adenopathy. Does not bruise/bleed easily.  Psychiatric/Behavioral: Negative for decreased concentration and dysphoric mood. The patient is not nervous/anxious.        Objective:   Physical Exam Constitutional:      General: She is not in acute distress.    Appearance: Normal appearance. She is normal weight.     Comments: Frail appearing   HENT:     Head: Normocephalic and atraumatic.     Mouth/Throat:     Mouth: Mucous membranes are moist.      Pharynx: Oropharynx is clear.  Eyes:     General: No scleral icterus.    Extraocular Movements: Extraocular movements intact.     Conjunctiva/sclera: Conjunctivae normal.     Pupils: Pupils are equal, round, and reactive to light.  Neck:     Musculoskeletal: Normal range of motion and neck supple. No muscular tenderness.  Cardiovascular:     Rate and Rhythm: Regular rhythm. Tachycardia present.     Heart sounds: Normal heart sounds.  Pulmonary:     Effort: Pulmonary effort is normal. No respiratory distress.     Breath sounds: Normal breath sounds. No wheezing or rales.     Comments: Diffusely distant bs  Abdominal:     Comments:  Baseline protuberant abdomen from ascites  Musculoskeletal:     Right lower leg: No edema.     Left lower leg: No edema.  Lymphadenopathy:     Cervical: No cervical adenopathy.  Skin:    General: Skin is warm and dry.     Findings: No rash.     Comments: 1 cm area of erythema (pink) and induration L neck (no tick present)   1 cm area of tick bite with retained tick bite on L upper arm (inferior)  Tick bite easily removed and cleaned  No bleeding  Scant induration  Just below it-scab from mole that was scratched-no sign of infection   Neurological:     General: No focal deficit present.     Mental Status: She is alert.     Motor: No weakness.     Coordination: Coordination normal.  Psychiatric:        Mood and Affect: Mood is anxious.     Comments: Pleasant            Assessment & Plan:   Problem List Items Addressed This Visit      Musculoskeletal and Integument   Tick bites - Primary    2 bites- L neck and L underarm  3rd area was just a scab from scratching a mole Per pt -not engorged ticks / bigger than deer ticks Tick part removed from 2nd bite w/o incident  Adv cleaning areas with soap and water Watch for target shaped lesion or rash or fever/malaise (pt voiced understanding)          Other   Smoker    No changes in  smoking status

## 2018-08-30 ENCOUNTER — Telehealth: Payer: Self-pay

## 2018-08-30 NOTE — Telephone Encounter (Signed)
Pt didn't have a virtual appt with Dr. Glori Bickers, and I didn't send out text message that I'm aware of.   Called pt to advised he that we didn't send her a text and if she received one it may have been in error. When I called pt she wanted me to ask Dr. Glori Bickers 2 questions.  1. Pt said her GI doc Dr. Elease Hashimoto told her she needs to get a Hep A/B vaccine, and told her she can go to a pharmacy and get it done but pt said she doesn't feel comfortable getting vaccine at pharmacy and wants to know if Dr. Glori Bickers would approve her getting immunizations here at our office.  2. Pt said at her last appt Dr. Glori Bickers said she wanted to see her in the office for a f/u. Pt has CPE scheduled 11/07/18 but wants to know if Dr. Glori Bickers wants to see her sooner then that in the office for a f/u visit

## 2018-08-30 NOTE — Telephone Encounter (Signed)
Pt notified of Dr. Marliss Coots comments.   3 [facility] nurse visits scheduled

## 2018-08-30 NOTE — Telephone Encounter (Signed)
I think July is actually fine (hopefully I can see her in person then)  If she needs to be seen for an acute issue before that please let me know  I'm fine with hep A and B imms by car

## 2018-08-30 NOTE — Telephone Encounter (Signed)
Pt left v/m she received a text and is not sure what it is about and request cb.

## 2018-09-05 ENCOUNTER — Ambulatory Visit (INDEPENDENT_AMBULATORY_CARE_PROVIDER_SITE_OTHER): Payer: Medicare HMO

## 2018-09-05 DIAGNOSIS — Z23 Encounter for immunization: Secondary | ICD-10-CM

## 2018-10-02 ENCOUNTER — Other Ambulatory Visit: Payer: Self-pay

## 2018-10-02 ENCOUNTER — Ambulatory Visit (INDEPENDENT_AMBULATORY_CARE_PROVIDER_SITE_OTHER): Payer: Medicare HMO | Admitting: Family Medicine

## 2018-10-02 ENCOUNTER — Encounter: Payer: Self-pay | Admitting: Family Medicine

## 2018-10-02 VITALS — BP 100/58 | HR 102 | Temp 98.0°F | Ht 64.0 in | Wt 112.5 lb

## 2018-10-02 DIAGNOSIS — M25521 Pain in right elbow: Secondary | ICD-10-CM | POA: Diagnosis not present

## 2018-10-02 NOTE — Patient Instructions (Signed)
If this keeps happening  Get an ULNAR NEUROPATHY BRACE.  [facility] [place] [facility] should be able to have them, but I would call them to make sure.

## 2018-10-02 NOTE — Progress Notes (Signed)
Eura Radabaugh T. Mahika Vanvoorhis, MD Primary Care and [place] at [facility] [place], [address] Phone: [phone]  FAX: [phone]  Christine Barton - 68 y.o. female  MRN [id number]  Date of Birth: 1950/05/22  Visit Date: 10/02/2018  PCP: [name], MD  Referred by: Tower, [name], MD  Chief Complaint  Patient presents with  . Elbow Pain    Right-Fell 1 month ago   Subjective:   Christine Barton is a 68 y.o. very pleasant female patient who presents with the following:  R elbow ulnar neuropathy:  1-2 months ago and she knew that she was falling and she fell directly on her elbow and then could not really drive her car.  Got a little better better each day.   Now she has full range of motion at the elbow, but she did have some pain over the weekend this is mostly in the ulnar groove.  Now this is gotten better, and she is essentially asymptomatic.  Past Medical History, Surgical History, Social History, Family History, Problem List, Medications, and Allergies have been reviewed and updated if relevant.  Patient Active Problem List   Diagnosis Date Noted  . Tick bites 08/14/2018  . Poor balance 05/30/2018  . Falls 05/30/2018  . Generalized weakness 05/30/2018  . Cirrhosis of liver ([place]) 04/24/2018  . Ascites 04/24/2018  . Gallstones 04/24/2018  . Occult blood positive stool 04/24/2018  . Abdominal pain 04/18/2018  . Screening mammogram, encounter for 11/07/2017  . Smoker 11/07/2017  . Screening examination for STD (sexually transmitted disease) 11/11/2016  . Welcome to Medicare preventive visit 10/26/2016  . Estrogen deficiency 10/26/2016  . Colon cancer screening 11/01/2014  . Elevated liver enzymes 11/01/2014  . Encounter for routine gynecological examination 09/12/2013  . Rapid heart beat 10/10/2012  . Heartburn 08/20/2011  . Routine general medical examination at a health care facility 07/21/2011  . Vitamin D  deficiency 08/04/2009  . POSTMENOPAUSAL STATUS 08/04/2009  . Prediabetes 02/18/2009  . Hyperlipidemia 06/14/2008  . [place] DISEASE, CERVICAL 03/30/2007  . History of alcohol abuse 12/06/2006  . NEUROPATHY 12/06/2006  . DIVERTICULOSIS, COLON 12/06/2006  . Fatty liver 12/06/2006  . BREAST CANCER, HX OF 12/06/2006    Past Medical History:  Diagnosis Date  . Alcohol abuse, unspecified   . Breast cancer ([place]) 1998   Right  . Cataract    left eye  . Cervical spondylosis 2006   MRI  . Degeneration of cervical intervertebral disc 2006   MRI  . Diverticulosis of colon (without mention of hemorrhage)   . Hyperpotassemia   . Microscopic hematuria   . Mononeuritis of unspecified site   . Nonspecific abnormal results of liver function study   . Other abnormal glucose   . Other and unspecified hyperlipidemia    no per pt  . Other chronic nonalcoholic liver disease   . Personal history of chemotherapy   . Personal history of malignant neoplasm of breast   . Personal history of radiation therapy   . Pneumothorax, acute    right, spontaneous  . Tobacco use disorder   . Unspecified vitamin D deficiency     Past Surgical History:  Procedure Laterality Date  . BREAST BIOPSY  9/03   Right  . BREAST LUMPECTOMY Right 1998  . BREAST LUMPECTOMY  1998  . CHEST TUBE INSERTION  11/02/2014  . COLONOSCOPY    . EYE SURGERY  02/2017   cataract  extraction with lens implant  . TUBAL LIGATION      Social History   Socioeconomic History  . Marital status: Single    Spouse name: Not on file  . Number of children: 1  . Years of education: Not on file  . Highest education level: Not on file  Occupational History  . Occupation: retired    Fish farm manager: [employer]  Social Needs  . Financial resource strain: Not on file  . Food insecurity    Worry: Not on file    Inability: Not on file  . Transportation needs    Medical: Not on file    Non-medical: Not on file  Tobacco Use  . Smoking  status: Current Every Day Smoker    Packs/day: 1.00    Years: 53.00    Pack years: 53.00    Types: Cigarettes  . Smokeless tobacco: Never Used  . Tobacco comment: tobacco info given 04/27/2018  Substance and Sexual Activity  . Alcohol use: Not Currently    Comment: occasional use  . Drug use: No  . Sexual activity: Not on file  Lifestyle  . Physical activity    Days per week: Not on file    Minutes per session: Not on file  . Stress: Not on file  Relationships  . Social Herbalist on phone: Not on file    Gets together: Not on file    Attends religious service: Not on file    Active member of club or organization: Not on file    Attends meetings of clubs or organizations: Not on file    Relationship status: Not on file  . Intimate partner violence    Fear of current or ex partner: Not on file    Emotionally abused: Not on file    Physically abused: Not on file    Forced sexual activity: Not on file  Other Topics Concern  . Not on file  Social History Narrative   Divorced      1 child      Works at [employer] History  Problem Relation Age of Onset  . Heart failure Father   . Colon cancer Maternal Uncle   . Stroke Mother   . Esophageal cancer Neg Hx   . Rectal cancer Neg Hx   . Stomach cancer Neg Hx     Allergies  Allergen Reactions  . Oxycodone Nausea And Vomiting  . Glipizide Other (See Comments)    Stomach pain  . Metformin And Related Other (See Comments)    Stomach pain    Medication list reviewed and updated in full in [facility].  GEN: No fevers, chills. Nontoxic. Primarily MSK c/o today. MSK: Detailed in the HPI GI: tolerating PO intake without difficulty Neuro: No numbness, parasthesias, or tingling associated. Otherwise the pertinent positives of the ROS are noted above.   Objective:   BP (!) 100/58   Pulse (!) 102   Temp 98 F (36.7 C) (Oral)   Ht 5' 4"  (1.626 m)   Wt 112 lb 8 oz (51 kg)   SpO2  99%   BMI 19.31 kg/m    GEN: WDWN, NAD, Non-toxic, Alert & Oriented x 3 HEENT: Atraumatic, Normocephalic.  Ears and Nose: No external deformity. EXTR: No clubbing/cyanosis/edema NEURO: Normal gait.  PSYCH: Normally interactive. Conversant. Not depressed or anxious appearing.  Calm demeanor.   b elbow Ecchymosis or edema: neg ROM:  full flexion, extension, pronation, supination Shoulder ROM: Full Flexion: 5/5 Extension: 5/5 Supination: 5/5  Pronation: 5/5 Wrist ext: 5/5 Wrist flexion: 5/5 No gross bony abnormality Varus and Valgus stress: stable ECRB tenderness: neg Medial epicondyle: NT Lateral epicondyle, resisted wrist extension from wrist full pronation and flexion: NT grip: 5/5  sensation intact Tinel's, Elbow: negative   Radiology: No results found.  Assessment and Plan:     ICD-10-CM   1. Right elbow pain  M25.521    I reassured the patient.  Her symptoms are gone now.  Really no way to know if she even fractured her elbow 1 to 2 months ago, and she has no pain now in any bony portion and has full range of motion.  Most recently it sounds like she irritated her ulnar nerve.  If symptoms persist she could always get ulnar nerve branch.  Follow-up: No follow-ups on file.  No orders of the defined types were placed in this encounter.  No orders of the defined types were placed in this encounter.   Signed,  Maud Deed. Pina Sirianni, MD   Outpatient Encounter Medications as of 10/02/2018  Medication Sig  . cyclobenzaprine (FLEXERIL) 10 MG tablet TAKE 0.5 TABLETS (5 MG TOTAL) BY MOUTH AT BEDTIME AS NEEDED FOR MUSCLE SPASMS.  Marland Kitchen lactulose (CHRONULAC) 10 GM/15ML solution Take 15 mLs (10 g total) by mouth 3 (three) times daily.  . Multiple Vitamin (MULTIVITAMIN WITH MINERALS) TABS tablet Take 1 tablet by mouth daily.  . mupirocin ointment (BACTROBAN) 2 % APPLY A SMALL AMOUNT TO AFFECTED AREA ON INSIDE OF NOSE 2 TIMES DAILY AS DIRECTED  . pantoprazole (PROTONIX) 40 MG  tablet Take 1 tablet (40 mg total) by mouth daily.  . potassium chloride (KLOR-CON) 20 MEQ packet Take 40 mEq by mouth daily.  Marland Kitchen spironolactone (ALDACTONE) 50 MG tablet Take 1 tablet (50 mg total) by mouth 2 (two) times daily.  . [DISCONTINUED] furosemide (LASIX) 20 MG tablet Take 2 tablets (40 mg total) by mouth daily.   No facility-administered encounter medications on file as of 10/02/2018.

## 2018-10-10 ENCOUNTER — Ambulatory Visit (INDEPENDENT_AMBULATORY_CARE_PROVIDER_SITE_OTHER): Payer: Medicare HMO | Admitting: *Deleted

## 2018-10-10 ENCOUNTER — Other Ambulatory Visit: Payer: Self-pay | Admitting: Family Medicine

## 2018-10-10 ENCOUNTER — Other Ambulatory Visit: Payer: Self-pay

## 2018-10-10 DIAGNOSIS — Z23 Encounter for immunization: Secondary | ICD-10-CM | POA: Diagnosis not present

## 2018-10-11 NOTE — Telephone Encounter (Signed)
Last ordered: 2 months ago by [name], MD Last refill: 09/05/2018

## 2018-10-17 ENCOUNTER — Telehealth: Payer: Self-pay | Admitting: Family Medicine

## 2018-10-17 NOTE — Telephone Encounter (Signed)
Patient called today and stated that Dr Mila Homer with [facility] advised her to have a liver fibro scan. She would like to know if you could recommend somewhere in [place] for her to have this done  C/B # [phone]

## 2018-10-18 ENCOUNTER — Other Ambulatory Visit: Payer: Self-pay | Admitting: Family Medicine

## 2018-10-18 DIAGNOSIS — Z1231 Encounter for screening mammogram for malignant neoplasm of breast: Secondary | ICD-10-CM

## 2018-10-18 NOTE — Telephone Encounter (Signed)
Looked it up and it said that a liver fibroscan is:  a specialized ultrasound machine for your liver. It measures fibrosis (scarring) and steatosis (fatty change) in your liver. Fatty change is when fat builds up in your liver cells.  Called pt to get more info and no answer so left VM requesting pt to call the office back

## 2018-10-18 NOTE — Telephone Encounter (Signed)
Routing to PCP as an FYI

## 2018-10-18 NOTE — Telephone Encounter (Signed)
Patient called back She stated that she has already taken care of this. And has an appointment scheduled with [facility] imagining

## 2018-10-18 NOTE — Telephone Encounter (Signed)
Patient called and asked would you give her call

## 2018-10-18 NOTE — Telephone Encounter (Signed)
I don't know what that is, is it a type of [place] or CT or MRI?   Where has she done it in the past and who ordered it?

## 2018-10-19 NOTE — Telephone Encounter (Signed)
What kind of diet do they want her to follow for liver dz?  (not clear from the notes)  Low sodium I expect.  Do they want her to eat less fat?   Increase calories to help weight gain? How is her appetite? Any specific recommendations re: protein? What kind of shakes is she drinking/eating? Thanks  I am happy to do the referral - just wanted a bit more info

## 2018-10-19 NOTE — Telephone Encounter (Signed)
Please have her f/u with her paperwork so I can review it.  (she can also call her liver office for more clarification) Thanks

## 2018-10-19 NOTE — Telephone Encounter (Signed)
Called pt back and she said she had 2 questions for Dr. Glori Bickers. She said that Dr. Hinda Lenis office recommended her seeing a dietician an if Dr. Marliss Coots okay with it the referral need to come from her. Pt is requesting Dr. Glori Bickers do a referral for her to see a dietician. Also pt has been drinking protein drinks/shakes daily as recommended by Dr. Hinda Lenis office, while pt was reading the info about the shakes it says that since she drinks them daily that she could get an Rx order from her PCP to get them as a prescription. Pt is requesting that Dr. Glori Bickers write a Rx for her protein shakes and pt said she drinks the plant based shakes

## 2018-10-19 NOTE — Telephone Encounter (Signed)
Pt isn't sure what type of diet it is it just tells her a list of food she can't eat and a few that they recommend pt said that it did scare her some because it said if she doesn't stick to the diet it could kill her, and she just wants help understanding what she can eat and how to prepare it. Pt said some of the foods it tells her she cant eat are: raw fish and, shrimp, red mean, fast food, processed meat, lunch meat, hotdogs, buttermilk. She said her weight is good, and her appetite is good, she said they mention a lot in the paperwork that she doesn't understands and needs help getting clarification and understanding.   Pt said that the shakes that she is getting are zoneperfect that has 53m of protein, 1 gram of sugar, 150 cal, but she said the paperwork said she needs to get a plant based shake and she cant tell if her zoneperfect shakes are plant based. So she would like a Rx for a plant based shake no matter what the name is. She said she drinks one shake a day

## 2018-10-20 NOTE — Telephone Encounter (Signed)
appt scheduled for Monday

## 2018-10-23 ENCOUNTER — Ambulatory Visit: Payer: Medicare HMO | Admitting: Family Medicine

## 2018-10-23 ENCOUNTER — Ambulatory Visit (INDEPENDENT_AMBULATORY_CARE_PROVIDER_SITE_OTHER): Payer: Medicare HMO | Admitting: Family Medicine

## 2018-10-23 ENCOUNTER — Telehealth: Payer: Self-pay

## 2018-10-23 ENCOUNTER — Telehealth: Payer: Self-pay | Admitting: Family Medicine

## 2018-10-23 ENCOUNTER — Other Ambulatory Visit: Payer: Self-pay

## 2018-10-23 ENCOUNTER — Telehealth: Payer: Self-pay | Admitting: Radiology

## 2018-10-23 ENCOUNTER — Encounter: Payer: Self-pay | Admitting: Family Medicine

## 2018-10-23 VITALS — BP 118/60 | HR 107 | Ht 64.0 in | Wt 109.0 lb

## 2018-10-23 DIAGNOSIS — E559 Vitamin D deficiency, unspecified: Secondary | ICD-10-CM

## 2018-10-23 DIAGNOSIS — E7800 Pure hypercholesterolemia, unspecified: Secondary | ICD-10-CM

## 2018-10-23 DIAGNOSIS — K7031 Alcoholic cirrhosis of liver with ascites: Secondary | ICD-10-CM | POA: Diagnosis not present

## 2018-10-23 DIAGNOSIS — R5382 Chronic fatigue, unspecified: Secondary | ICD-10-CM

## 2018-10-23 DIAGNOSIS — D649 Anemia, unspecified: Secondary | ICD-10-CM | POA: Diagnosis not present

## 2018-10-23 DIAGNOSIS — R7303 Prediabetes: Secondary | ICD-10-CM | POA: Diagnosis not present

## 2018-10-23 DIAGNOSIS — F17200 Nicotine dependence, unspecified, uncomplicated: Secondary | ICD-10-CM

## 2018-10-23 DIAGNOSIS — R5383 Other fatigue: Secondary | ICD-10-CM | POA: Insufficient documentation

## 2018-10-23 LAB — COMPREHENSIVE METABOLIC PANEL
ALT: 9 U/L (ref 0–35)
AST: 25 U/L (ref 0–37)
Albumin: 4.1 g/dL (ref 3.5–5.2)
Alkaline Phosphatase: 90 U/L (ref 39–117)
BUN: 27 mg/dL — ABNORMAL HIGH (ref 6–23)
CO2: 22 mEq/L (ref 19–32)
Calcium: 10.3 mg/dL (ref 8.4–10.5)
Chloride: 97 mEq/L (ref 96–112)
Creatinine, Ser: 0.97 mg/dL (ref 0.40–1.20)
GFR: 57.13 mL/min — ABNORMAL LOW (ref >60.00)
Glucose, Bld: 160 mg/dL — ABNORMAL HIGH (ref 70–99)
Potassium: 4.7 mEq/L (ref 3.5–5.1)
Sodium: 130 mEq/L — ABNORMAL LOW (ref 135–145)
Total Bilirubin: 1 mg/dL (ref 0.2–1.2)
Total Protein: 7.4 g/dL (ref 6.0–8.3)

## 2018-10-23 LAB — LIPID PANEL
Cholesterol: 121 mg/dL (ref 0–200)
HDL: 32.3 mg/dL — ABNORMAL LOW (ref >39.00)
LDL Cholesterol: 69 mg/dL (ref 0–99)
NonHDL: 88.73
Total CHOL/HDL Ratio: 4
Triglycerides: 99 mg/dL (ref 0.0–149.0)
VLDL: 19.8 mg/dL (ref 0.0–40.0)

## 2018-10-23 LAB — FERRITIN: Ferritin: 8.2 ng/mL — ABNORMAL LOW (ref 10.0–291.0)

## 2018-10-23 LAB — VITAMIN D 25 HYDROXY (VIT D DEFICIENCY, FRACTURES): VITD: 71.48 ng/mL (ref 30.00–100.00)

## 2018-10-23 LAB — CBC WITH DIFFERENTIAL/PLATELET
Basophils Absolute: 0 10*3/uL (ref 0.0–0.1)
Basophils Relative: 0.7 % (ref 0.0–3.0)
Eosinophils Absolute: 0.1 10*3/uL (ref 0.0–0.7)
Eosinophils Relative: 0.8 % (ref 0.0–5.0)
HCT: 23.8 % — CL (ref 36.0–46.0)
Hemoglobin: 7.7 g/dL — CL (ref 12.0–15.0)
Lymphocytes Relative: 17.7 % (ref 12.0–46.0)
Lymphs Abs: 1.2 10*3/uL (ref 0.7–4.0)
MCHC: 32.5 g/dL (ref 30.0–36.0)
MCV: 78.2 fl (ref 78.0–100.0)
Monocytes Absolute: 0.5 10*3/uL (ref 0.1–1.0)
Monocytes Relative: 7.4 % (ref 3.0–12.0)
Neutro Abs: 5.1 10*3/uL (ref 1.4–7.7)
Neutrophils Relative %: 73.4 % (ref 43.0–77.0)
Platelets: 138 10*3/uL — ABNORMAL LOW (ref 150.0–400.0)
RBC: 3.04 Mil/uL — ABNORMAL LOW (ref 3.87–5.11)
RDW: 16.9 % — ABNORMAL HIGH (ref 11.5–15.5)
WBC: 7 10*3/uL (ref 4.0–10.5)

## 2018-10-23 LAB — HEMOGLOBIN A1C: Hgb A1c MFr Bld: 7 % — ABNORMAL HIGH (ref 4.6–6.5)

## 2018-10-23 LAB — TSH: TSH: 1.95 u[IU]/mL (ref 0.35–4.50)

## 2018-10-23 NOTE — Assessment & Plan Note (Addendum)
Pt has seen GI here and liver specialist at [facility] Diet was px (low sodium and low fat)  It was recommended she see a nutritionist- referral done  Rev materials from the liver office also  inst her not to take any supplements they do not recommend  No longer drinks etoh Enc to quit smoking  Labs/note rev from [facility] today Liver fibro scan and MRI are upcoming

## 2018-10-23 NOTE — Assessment & Plan Note (Signed)
Due for A1C Disc low glycemic diet

## 2018-10-23 NOTE — Telephone Encounter (Signed)
Notified patient.  She is going to contact her liver doctor and discuss as well.  Thanks [place] for the info.

## 2018-10-23 NOTE — Assessment & Plan Note (Signed)
Pt is craving ice  Cbc with ferritin today

## 2018-10-23 NOTE — Assessment & Plan Note (Signed)
Disc in detail risks of smoking and possible outcomes including copd, vascular/ heart disease, cancer , respiratory and sinus infections  Pt voices understanding  She is not ready to consider quitting

## 2018-10-23 NOTE — Assessment & Plan Note (Signed)
Lipid panel today  Disc low sat/trans fat diet for this and liver health

## 2018-10-23 NOTE — Assessment & Plan Note (Signed)
Cbc/tsh  Pt has cirrhosis / past etoh abuse

## 2018-10-23 NOTE — Telephone Encounter (Signed)
I think it sounds fine (unless her liver doctor told her to stop or change it)  We want to watch sodium and fat in diet - no protein restrictions right now that I am aware of

## 2018-10-23 NOTE — Telephone Encounter (Signed)
Dr. Glori Bickers please advise regarding protein supplement.  Thanks!

## 2018-10-23 NOTE — Telephone Encounter (Signed)
Pt says she forgot to mention in her OV that she has been taking Unisom at bedtime as needed for sleep and wanted to make sure it was safe to take with her other meds. Please call pt.

## 2018-10-23 NOTE — Assessment & Plan Note (Signed)
Disc imp to bone and overall health Labs today

## 2018-10-23 NOTE — Telephone Encounter (Signed)
I resulted the lab

## 2018-10-23 NOTE — Telephone Encounter (Signed)
[facility] lab called critical results, HGB - 7.7, HCT - 23.8. Results given to Dr Glori Bickers

## 2018-10-23 NOTE — Telephone Encounter (Signed)
Best number [phone]  Pt called stating she forgot to ask you if she should continuing taking her protein drink  30grams daily

## 2018-10-23 NOTE — Progress Notes (Signed)
Subjective:    Patient ID: Christine Barton, female    DOB: 1951/01/25, 68 y.o.   MRN: [id number]  HPI Here to review dietary recommendations from her liver specialist   Feeling ok for the most part  She is taking care of herself   Wt Readings from Last 3 Encounters:  10/23/18 109 lb (49.4 kg)  10/02/18 112 lb 8 oz (51 kg)  08/14/18 109 lb 2 oz (49.5 kg)  her fluid status is good  Varies 103 to 110 lb - holding steady  She is planning some exercise  18.71 kg/m   Smoking status -no changes   Appetite has been ok most of the times  There are days when she is not hungry    She has cirrhosis  Continues to avoid alcohol    It is recommended to be mindful of fats  Avoid raw or undercooked meat/seafood  More specifics are included in a handout she was given  It mentions avoidance or red meat and processed lunch meat/sausage  Prefers lean meat,eggs, nuts and other plant based proteins   Sodium level is recommended for people with ascites   Supplements- want to avoid excessive vit A   Ascites status  Taking aldactone  Her abdomen is back to normal  Avoiding salt for the most part   Taking 50 mg twice daily = no problems with that  She eats ice and drinks water   She has been trying to limit soft drinks   Prediabetes Lab Results  Component Value Date   HGBA1C 6.2 10/31/2017   Lab Results  Component Value Date   WBC 9.2 06/28/2018   HGB 10.8 (L) 06/28/2018   HCT 31.9 (L) 06/28/2018   MCV 96.5 06/28/2018   PLT 254.0 06/28/2018    She has MRI pending  Also liver fibro scan  Has mammogram scheduled for October   Lab Results  Component Value Date   CHOL 133 10/31/2017   HDL 25.30 (L) 10/31/2017   LDLCALC 124 (H) 10/19/2016   LDLDIRECT 83.0 10/31/2017   TRIG 261.0 (H) 10/31/2017   CHOLHDL 5 10/31/2017    Patient Active Problem List   Diagnosis Date Noted  . Anemia 10/23/2018  . Fatigue 10/23/2018  . Tick bites 08/14/2018  . Poor balance 05/30/2018   . Falls 05/30/2018  . Generalized weakness 05/30/2018  . Cirrhosis of liver ([place]) 04/24/2018  . Ascites 04/24/2018  . Gallstones 04/24/2018  . Occult blood positive stool 04/24/2018  . Abdominal pain 04/18/2018  . Screening mammogram, encounter for 11/07/2017  . Smoker 11/07/2017  . Screening examination for STD (sexually transmitted disease) 11/11/2016  . Welcome to Medicare preventive visit 10/26/2016  . Estrogen deficiency 10/26/2016  . Colon cancer screening 11/01/2014  . Elevated liver enzymes 11/01/2014  . Encounter for routine gynecological examination 09/12/2013  . Rapid heart beat 10/10/2012  . Heartburn 08/20/2011  . Routine general medical examination at a health care facility 07/21/2011  . Vitamin D deficiency 08/04/2009  . POSTMENOPAUSAL STATUS 08/04/2009  . Prediabetes 02/18/2009  . Hyperlipidemia 06/14/2008  . [place] DISEASE, CERVICAL 03/30/2007  . History of alcohol abuse 12/06/2006  . NEUROPATHY 12/06/2006  . DIVERTICULOSIS, COLON 12/06/2006  . Fatty liver 12/06/2006  . BREAST CANCER, HX OF 12/06/2006   Past Medical History:  Diagnosis Date  . Alcohol abuse, unspecified   . Breast cancer ([place]) 1998   Right  . Cataract    left eye  . Cervical spondylosis 2006   MRI  .  Degeneration of cervical intervertebral disc 2006   MRI  . Diverticulosis of colon (without mention of hemorrhage)   . Hyperpotassemia   . Microscopic hematuria   . Mononeuritis of unspecified site   . Nonspecific abnormal results of liver function study   . Other abnormal glucose   . Other and unspecified hyperlipidemia    no per pt  . Other chronic nonalcoholic liver disease   . Personal history of chemotherapy   . Personal history of malignant neoplasm of breast   . Personal history of radiation therapy   . Pneumothorax, acute    right, spontaneous  . Tobacco use disorder   . Unspecified vitamin D deficiency    Past Surgical History:  Procedure Laterality Date  . BREAST BIOPSY   9/03   Right  . BREAST LUMPECTOMY Right 1998  . BREAST LUMPECTOMY  1998  . CHEST TUBE INSERTION  11/02/2014  . COLONOSCOPY    . EYE SURGERY  02/2017   cataract extraction with lens implant  . TUBAL LIGATION     Social History   Tobacco Use  . Smoking status: Current Every Day Smoker    Packs/day: 1.00    Years: 53.00    Pack years: 53.00    Types: Cigarettes  . Smokeless tobacco: Never Used  . Tobacco comment: tobacco info given 04/27/2018  Substance Use Topics  . Alcohol use: Not Currently    Comment: occasional use  . Drug use: No   Family History  Problem Relation Age of Onset  . Heart failure Father   . Colon cancer Maternal Uncle   . Stroke Mother   . Esophageal cancer Neg Hx   . Rectal cancer Neg Hx   . Stomach cancer Neg Hx    Allergies  Allergen Reactions  . Oxycodone Nausea And Vomiting  . Glipizide Other (See Comments)    Stomach pain  . Metformin And Related Other (See Comments)    Stomach pain   Current Outpatient Medications on File Prior to Visit  Medication Sig Dispense Refill  . cyclobenzaprine (FLEXERIL) 10 MG tablet TAKE 0.5 TABLETS (5 MG TOTAL) BY MOUTH AT BEDTIME AS NEEDED FOR MUSCLE SPASMS. 15 tablet 1  . doxylamine, Sleep, (UNISOM) 25 MG tablet Take 25 mg by mouth at bedtime as needed.    . lactulose (CHRONULAC) 10 GM/15ML solution Take 15 mLs (10 g total) by mouth 3 (three) times daily. 236 mL 11  . mupirocin ointment (BACTROBAN) 2 % APPLY A SMALL AMOUNT TO AFFECTED AREA ON INSIDE OF NOSE 2 TIMES DAILY AS DIRECTED 22 g 1  . pantoprazole (PROTONIX) 40 MG tablet Take 1 tablet (40 mg total) by mouth daily. 90 tablet 3  . spironolactone (ALDACTONE) 50 MG tablet Take 1 tablet (50 mg total) by mouth 2 (two) times daily. 180 tablet 3   No current facility-administered medications on file prior to visit.     Review of Systems  Constitutional: Positive for fatigue. Negative for activity change, appetite change, fever and unexpected weight change.   HENT: Negative for congestion, ear pain, rhinorrhea, sinus pressure and sore throat.   Eyes: Negative for pain, redness and visual disturbance.  Respiratory: Negative for cough, shortness of breath and wheezing.   Cardiovascular: Negative for chest pain and palpitations.  Gastrointestinal: Negative for abdominal pain, blood in stool, constipation and diarrhea.  Endocrine: Negative for polydipsia and polyuria.  Genitourinary: Negative for dysuria, frequency and urgency.  Musculoskeletal: Negative for arthralgias, back pain and myalgias.  Skin:  Negative for pallor and rash.       No jaundice  Allergic/Immunologic: Negative for environmental allergies.  Neurological: Negative for dizziness, syncope and headaches.       Poor balance/coordination at times  Hematological: Negative for adenopathy. Does not bruise/bleed easily.  Psychiatric/Behavioral: Negative for decreased concentration and dysphoric mood. The patient is not nervous/anxious.        Slowed cognition Confuses more easily       Objective:   Physical Exam Constitutional:      General: She is not in acute distress.    Appearance: Normal appearance. She is normal weight. She is not ill-appearing or diaphoretic.     Comments: Frail appearing   HENT:     Head: Normocephalic and atraumatic.     Mouth/Throat:     Mouth: Mucous membranes are moist.     Pharynx: Oropharynx is clear. No posterior oropharyngeal erythema.  Eyes:     General: No scleral icterus.       Right eye: No discharge.        Left eye: No discharge.     Extraocular Movements: Extraocular movements intact.     Conjunctiva/sclera: Conjunctivae normal.     Pupils: Pupils are equal, round, and reactive to light.  Neck:     Musculoskeletal: Normal range of motion and neck supple.     Vascular: No carotid bruit.  Cardiovascular:     Rate and Rhythm: Regular rhythm. Tachycardia present.     Pulses: Normal pulses.     Heart sounds: Normal heart sounds.   Pulmonary:     Effort: Pulmonary effort is normal. No respiratory distress.     Breath sounds: Normal breath sounds. No wheezing.     Comments: Diffusely distant bs No wheezing  Abdominal:     General: Abdomen is flat. Bowel sounds are normal. There is no distension.     Palpations: Abdomen is soft. There is no mass.     Tenderness: There is no abdominal tenderness.     Hernia: No hernia is present.  Musculoskeletal:     Right lower leg: No edema.     Left lower leg: No edema.  Lymphadenopathy:     Cervical: No cervical adenopathy.  Skin:    General: Skin is warm and dry.     Coloration: Skin is not jaundiced.     Findings: No rash.  Neurological:     Mental Status: She is alert. Mental status is at baseline.     Cranial Nerves: No cranial nerve deficit.     Sensory: No sensory deficit.     Motor: No weakness.     Deep Tendon Reflexes: Reflexes normal.  Psychiatric:        Mood and Affect: Mood normal.     Comments: Cognition is mildly slowed             Assessment & Plan:   Problem List Items Addressed This Visit      Digestive   Cirrhosis of liver ([place]) - Primary    Pt has seen GI here and liver specialist at [facility] Diet was px (low sodium and low fat)  It was recommended she see a nutritionist- referral done  Rev materials from the liver office also  inst her not to take any supplements they do not recommend  No longer drinks etoh Enc to quit smoking  Labs/note rev from [facility] today Liver fibro scan and MRI are upcoming       Relevant  Orders   Amb ref to Medical Nutrition Therapy-M[facility]   Comprehensive metabolic panel (Completed)     Other   Vitamin D deficiency    Disc imp to bone and overall health Labs today      Relevant Orders   VITAMIN D 25 Hydroxy (Vit-D Deficiency, Fractures) (Completed)   Hyperlipidemia    Lipid panel today  Disc low sat/trans fat diet for this and liver health      Relevant Orders   Lipid panel (Completed)   Prediabetes     Due for A1C Disc low glycemic diet       Relevant Orders   Amb ref to Medical Nutrition Therapy-M[facility]   Hemoglobin A1c (Completed)   Smoker    Disc in detail risks of smoking and possible outcomes including copd, vascular/ heart disease, cancer , respiratory and sinus infections  Pt voices understanding  She is not ready to consider quitting      Ascites    Due to cirrhosis  Much improved with spironolactone Enc to continue hepatic and GI f/u  Has MRI and liver fibro scan soon      Relevant Orders   Comprehensive metabolic panel (Completed)   Anemia    Pt is craving ice  Cbc with ferritin today       Relevant Orders   CBC with Differential/Platelet (Completed)   Ferritin (Completed)   Fatigue    Cbc/tsh  Pt has cirrhosis / past etoh abuse      Relevant Orders   TSH (Completed)

## 2018-10-23 NOTE — Assessment & Plan Note (Signed)
Due to cirrhosis  Much improved with spironolactone Enc to continue hepatic and GI f/u  Has MRI and liver fibro scan soon

## 2018-10-23 NOTE — Telephone Encounter (Signed)
Notified patient, she verbalizes understanding.

## 2018-10-23 NOTE — Patient Instructions (Addendum)
Do not take any vitamins unless the liver doctor tells you to take them  Calcium and vitamin D are good for your bones   We are doing your labs for the physical at the end of the month today while you are here   Take care of yourself   The office will call you about the nutrition referral

## 2018-10-23 NOTE — Telephone Encounter (Signed)
I think it should be ok as long as it is not too sedating or causing dizziness

## 2018-10-24 ENCOUNTER — Other Ambulatory Visit: Payer: Self-pay | Admitting: *Deleted

## 2018-10-24 DIAGNOSIS — K7031 Alcoholic cirrhosis of liver with ascites: Secondary | ICD-10-CM

## 2018-10-24 DIAGNOSIS — D649 Anemia, unspecified: Secondary | ICD-10-CM

## 2018-10-24 MED ORDER — POLYSACCHARIDE IRON COMPLEX 150 MG PO CAPS: 150.0000 mg | ORAL_CAPSULE | Freq: Two times a day (BID) | ORAL | 3 refills | Status: DC

## 2018-10-24 NOTE — Telephone Encounter (Signed)
Referral done   It is ok to start the iron  If any problems let me know  Will cc to [facility]

## 2018-10-24 NOTE — Telephone Encounter (Signed)
Left VM letting pt know Dr. Marliss Coots comments

## 2018-10-24 NOTE — Telephone Encounter (Signed)
-----   Message from [name], MD sent at 10/24/2018  1:16 PM EDT ----- I need to place an urgent GI referral (her local GI doctor)  and start her on iron   Let me know if agreeable with ref and I will do that  Please send in nu iron 150 mg 1 po bid #[phone] refills If constipation-can take stool softener

## 2018-10-24 NOTE — Telephone Encounter (Signed)
Pt notified of Dr. Marliss Coots comments off of her labs. She is okay with the urgent referral back to GI.   Pt did have a question for Dr. Glori Bickers. Pt said that the paper work she went over with Dr. Glori Bickers said to avoid supplements with iron in them including multivitamin so pt is concerned if her ppw just said don't take iron because it's hard on liver. Pt question what is taking an Rx strength iron BID going to do to her liver, and it is safe to take given what her pamphlet said, Rx had already been sent to pharmacy so pt advised do not pick it up until she hears back from our office

## 2018-10-24 NOTE — Telephone Encounter (Signed)
Left VM requesting pt to call the office back 

## 2018-10-24 NOTE — Telephone Encounter (Signed)
Called patient about Urgent GI Referral, had to leave a message for patient to call me back

## 2018-10-25 ENCOUNTER — Telehealth: Payer: Self-pay | Admitting: Gastroenterology

## 2018-10-25 NOTE — Telephone Encounter (Signed)
Left a message for Christine Barton the patient care coordinator. Left a message offering an appointment for the patient on Friday 10/27/18. Called the patient. She agrees to come on Friday for a 9:00 am appointment. She will wear a mask.

## 2018-10-25 NOTE — Telephone Encounter (Signed)
There are not any openings on the APP schedules or yours. Please advise.

## 2018-10-25 NOTE — Telephone Encounter (Signed)
Ladoris from PCP called requesting pt to be seen asap as her hemoglobin has dropped to 7.7. Dr. Woodward Ku first available is at the end of August and there are no appts. Available with APPs. Please advise on scheduling or call Felcia at [phone].

## 2018-10-25 NOTE — Telephone Encounter (Signed)
Please schedule Christine Barton this Friday, she has an available appointment.

## 2018-10-26 ENCOUNTER — Telehealth: Payer: Self-pay | Admitting: General Surgery

## 2018-10-26 NOTE — Telephone Encounter (Signed)
Covid-19 screening questions   Do you now or have you had a fever in the last 14 days? NO  Do you have any respiratory symptoms of shortness of breath or cough now or in the last 14 days? NO  Do you have any family members or close contacts with diagnosed or suspected Covid-19 in the past 14 days?NO  Have you been tested for Covid-19 and found to be positive?NO   Notified the patient that she and her healthcare partner will need to wear a mask to the clinic. Patient verbalized understanding.

## 2018-10-27 ENCOUNTER — Ambulatory Visit: Payer: Medicare HMO | Admitting: Gastroenterology

## 2018-10-27 ENCOUNTER — Encounter: Payer: Self-pay | Admitting: Gastroenterology

## 2018-10-27 ENCOUNTER — Other Ambulatory Visit (HOSPITAL_COMMUNITY)
Admission: RE | Admit: 2018-10-27 | Discharge: 2018-10-27 | Disposition: A | Discharge status: Home / Self Care | Payer: Medicare HMO | Source: Ambulatory Visit | Attending: Gastroenterology | Admitting: Gastroenterology

## 2018-10-27 ENCOUNTER — Other Ambulatory Visit: Payer: Self-pay

## 2018-10-27 ENCOUNTER — Other Ambulatory Visit (INDEPENDENT_AMBULATORY_CARE_PROVIDER_SITE_OTHER): Payer: Medicare HMO

## 2018-10-27 ENCOUNTER — Telehealth: Payer: Self-pay | Admitting: Gastroenterology

## 2018-10-27 VITALS — BP 108/50 | HR 96 | Temp 97.7°F | Ht 64.0 in | Wt 109.0 lb

## 2018-10-27 DIAGNOSIS — R195 Other fecal abnormalities: Secondary | ICD-10-CM

## 2018-10-27 DIAGNOSIS — K7460 Unspecified cirrhosis of liver: Secondary | ICD-10-CM | POA: Diagnosis not present

## 2018-10-27 DIAGNOSIS — Z1159 Encounter for screening for other viral diseases: Secondary | ICD-10-CM | POA: Insufficient documentation

## 2018-10-27 DIAGNOSIS — I8500 Esophageal varices without bleeding: Secondary | ICD-10-CM | POA: Insufficient documentation

## 2018-10-27 LAB — CBC WITH DIFFERENTIAL/PLATELET
Basophils Absolute: 0.1 10*3/uL (ref 0.0–0.1)
Basophils Relative: 0.7 % (ref 0.0–3.0)
Eosinophils Absolute: 0.1 10*3/uL (ref 0.0–0.7)
Eosinophils Relative: 0.9 % (ref 0.0–5.0)
HCT: 25.2 % — ABNORMAL LOW (ref 36.0–46.0)
Hemoglobin: 8.1 g/dL — ABNORMAL LOW (ref 12.0–15.0)
Lymphocytes Relative: 18.4 % (ref 12.0–46.0)
Lymphs Abs: 1.5 10*3/uL (ref 0.7–4.0)
MCHC: 32 g/dL (ref 30.0–36.0)
MCV: 77.7 fl — ABNORMAL LOW (ref 78.0–100.0)
Monocytes Absolute: 0.7 10*3/uL (ref 0.1–1.0)
Monocytes Relative: 8.2 % (ref 3.0–12.0)
Neutro Abs: 5.9 10*3/uL (ref 1.4–7.7)
Neutrophils Relative %: 71.8 % (ref 43.0–77.0)
Platelets: 263 10*3/uL (ref 150.0–400.0)
RBC: 3.24 Mil/uL — ABNORMAL LOW (ref 3.87–5.11)
RDW: 17.5 % — ABNORMAL HIGH (ref 11.5–15.5)
WBC: 8.2 10*3/uL (ref 4.0–10.5)

## 2018-10-27 LAB — PROTIME-INR
INR: 1.3 ratio — ABNORMAL HIGH (ref 0.8–1.0)
Prothrombin Time: 15 s — ABNORMAL HIGH (ref 9.6–13.1)

## 2018-10-27 LAB — SARS CORONAVIRUS 2 (TAT 6-24 HRS): SARS Coronavirus 2: NEGATIVE

## 2018-10-27 NOTE — Telephone Encounter (Signed)
The pt called because she wanted to be sure Janett Billow is aware her stools are black.  I advised her that Janett Billow did have that in her note from the appt today and also we discussed her labs again and the recommendations per Alonza Bogus.

## 2018-10-27 NOTE — Telephone Encounter (Signed)
Patient called in wanting to advise the nurse that her stools are black as tar.

## 2018-10-27 NOTE — H&P (View-Only) (Signed)
10/27/2018 Christine TILLIS [id number] 1950-10-29   HISTORY OF PRESENT ILLNESS:  This is a 68 year-old female who is a patient of Dr. Woodward Ku.  She is followed here for alcohol induced cirrhosis.  She was last seen here in March of this year at which time she was stable and doing well.  Recently she had seen her PCP and complained of being even more fatigued than normal and craving ice.  Hemoglobin was found to be 7.7 g, down from 10.8 g just four months ago.  She reports very dark stools for the past week or so.  She started on iron supplements just yesterday.  She denies seeing any red blood in her stools.  She denies any abdominal pain.  BUN is also slightly elevated with labs.  She had an EGD performed earlier this year in February at which time she was found to have grade 1 esophageal varices, 4 cm hiatal hernia, portal hypertensive gastropathy, and grad A reflux esophagitis.  Is on pantoprazole 40 mg daily.   Past Medical History:  Diagnosis Date  . Alcohol abuse, unspecified   . Breast cancer ([place]) 1998   Right  . Cataract    left eye  . Cervical spondylosis 2006   MRI  . Degeneration of cervical intervertebral disc 2006   MRI  . Diverticulosis of colon (without mention of hemorrhage)   . Hyperpotassemia   . Microscopic hematuria   . Mononeuritis of unspecified site   . Nonspecific abnormal results of liver function study   . Other abnormal glucose   . Other and unspecified hyperlipidemia    no per pt  . Other chronic nonalcoholic liver disease   . Personal history of chemotherapy   . Personal history of malignant neoplasm of breast   . Personal history of radiation therapy   . Pneumothorax, acute    right, spontaneous  . Tobacco use disorder   . Unspecified vitamin D deficiency    Past Surgical History:  Procedure Laterality Date  . BREAST BIOPSY  9/03   Right  . BREAST LUMPECTOMY Right 1998  . BREAST LUMPECTOMY  1998  . CHEST TUBE INSERTION  11/02/2014  .  COLONOSCOPY    . EYE SURGERY  02/2017   cataract extraction with lens implant  . TUBAL LIGATION      reports that she has been smoking cigarettes. She has a 53.00 pack-year smoking history. She has never used smokeless tobacco. She reports previous alcohol use. She reports that she does not use drugs. family history includes Colon cancer in her maternal uncle; Heart failure in her father; Stroke in her mother. Allergies  Allergen Reactions  . Oxycodone Nausea And Vomiting  . Glipizide Other (See Comments)    Stomach pain  . Metformin And Related Other (See Comments)    Stomach pain      Outpatient Encounter Medications as of 10/27/2018  Medication Sig  . cyclobenzaprine (FLEXERIL) 10 MG tablet TAKE 0.5 TABLETS (5 MG TOTAL) BY MOUTH AT BEDTIME AS NEEDED FOR MUSCLE SPASMS.  Marland Kitchen doxylamine, Sleep, (UNISOM) 25 MG tablet Take 25 mg by mouth at bedtime as needed.  . furosemide (LASIX) 20 MG tablet Take 40 mg by mouth daily.  . iron polysaccharides (NIFEREX) 150 MG capsule Take 1 capsule (150 mg total) by mouth 2 (two) times daily.  Marland Kitchen lactulose (CHRONULAC) 10 GM/15ML solution Take 15 mLs (10 g total) by mouth 3 (three) times daily.  . mupirocin ointment (BACTROBAN) 2 %  APPLY A SMALL AMOUNT TO AFFECTED AREA ON INSIDE OF NOSE 2 TIMES DAILY AS DIRECTED  . pantoprazole (PROTONIX) 40 MG tablet Take 1 tablet (40 mg total) by mouth daily.  Marland Kitchen spironolactone (ALDACTONE) 50 MG tablet Take 1 tablet (50 mg total) by mouth 2 (two) times daily.   No facility-administered encounter medications on file as of 10/27/2018.      REVIEW OF SYSTEMS  : All other systems reviewed and negative except where noted in the History of Present Illness.   PHYSICAL EXAM: BP (!) 108/50 (BP Location: Left Arm, Patient Position: Sitting, Cuff Size: Normal)   Pulse 96   Temp 97.7 F (36.5 C)   Ht 5' 4"  (1.626 m)   Wt 109 lb (49.4 kg)   BMI 18.71 kg/m  General: Well developed white female in no acute distress Head:  Normocephalic and atraumatic Eyes:  Sclerae anicteric, conjunctiva pink. Ears: Normal auditory acuity Lungs: Coarse BS throughout; no increased WOB. Heart: Regular rate and rhythm; no M/R/G Abdomen: Soft, non-distended.  BS present.  Non-tender. Rectal:  No external abnormalities noted.  DRE revealed some stool in rectum, very black stool on exam glove.  Heme positive. Musculoskeletal: Symmetrical with no gross deformities  Skin: No lesions on visible extremities Extremities: No edema  Neurological: Alert oriented x 4, grossly non-focal Psychological:  Alert and cooperative. Normal mood and affect  ASSESSMENT AND PLAN: *68 year old female with cirrhosis, small esophageal varices seen on EGD in 05/2018.  Now with 3 gram drop in Hgb and very dark stools, heme positive today.  Will check STAT CBC and PT/INR today.  If Hgb continuing to trend down then will need to go to the ED for admission and hospital EGD, transfusion, etc.  As long as it is stable we are currently planning for EGD at the hospital with Dr. [name] early next week.  **The risks, benefits, and alternatives to EGD were discussed with the patient and she consents to proceed.    CC:  Barton, Christine Fanny, MD  Reviewed and agree with documentation and assessment and plan. Christine Barton , MD

## 2018-10-27 NOTE — Progress Notes (Addendum)
10/27/2018 Christine Barton [id number] 1951/03/13   HISTORY OF PRESENT ILLNESS:  This is a 68 year-old female who is a patient of Dr. Woodward Ku.  She is followed here for alcohol induced cirrhosis.  She was last seen here in March of this year at which time she was stable and doing well.  Recently she had seen her PCP and complained of being even more fatigued than normal and craving ice.  Hemoglobin was found to be 7.7 g, down from 10.8 g just four months ago.  She reports very dark stools for the past week or so.  She started on iron supplements just yesterday.  She denies seeing any red blood in her stools.  She denies any abdominal pain.  BUN is also slightly elevated with labs.  She had an EGD performed earlier this year in February at which time she was found to have grade 1 esophageal varices, 4 cm hiatal hernia, portal hypertensive gastropathy, and grad A reflux esophagitis.  Is on pantoprazole 40 mg daily.   Past Medical History:  Diagnosis Date  . Alcohol abuse, unspecified   . Breast cancer ([place]) 1998   Right  . Cataract    left eye  . Cervical spondylosis 2006   MRI  . Degeneration of cervical intervertebral disc 2006   MRI  . Diverticulosis of colon (without mention of hemorrhage)   . Hyperpotassemia   . Microscopic hematuria   . Mononeuritis of unspecified site   . Nonspecific abnormal results of liver function study   . Other abnormal glucose   . Other and unspecified hyperlipidemia    no per pt  . Other chronic nonalcoholic liver disease   . Personal history of chemotherapy   . Personal history of malignant neoplasm of breast   . Personal history of radiation therapy   . Pneumothorax, acute    right, spontaneous  . Tobacco use disorder   . Unspecified vitamin D deficiency    Past Surgical History:  Procedure Laterality Date  . BREAST BIOPSY  9/03   Right  . BREAST LUMPECTOMY Right 1998  . BREAST LUMPECTOMY  1998  . CHEST TUBE INSERTION  11/02/2014  .  COLONOSCOPY    . EYE SURGERY  02/2017   cataract extraction with lens implant  . TUBAL LIGATION      reports that she has been smoking cigarettes. She has a 53.00 pack-year smoking history. She has never used smokeless tobacco. She reports previous alcohol use. She reports that she does not use drugs. family history includes Colon cancer in her maternal uncle; Heart failure in her father; Stroke in her mother. Allergies  Allergen Reactions  . Oxycodone Nausea And Vomiting  . Glipizide Other (See Comments)    Stomach pain  . Metformin And Related Other (See Comments)    Stomach pain      Outpatient Encounter Medications as of 10/27/2018  Medication Sig  . cyclobenzaprine (FLEXERIL) 10 MG tablet TAKE 0.5 TABLETS (5 MG TOTAL) BY MOUTH AT BEDTIME AS NEEDED FOR MUSCLE SPASMS.  Marland Kitchen doxylamine, Sleep, (UNISOM) 25 MG tablet Take 25 mg by mouth at bedtime as needed.  . furosemide (LASIX) 20 MG tablet Take 40 mg by mouth daily.  . iron polysaccharides (NIFEREX) 150 MG capsule Take 1 capsule (150 mg total) by mouth 2 (two) times daily.  Marland Kitchen lactulose (CHRONULAC) 10 GM/15ML solution Take 15 mLs (10 g total) by mouth 3 (three) times daily.  . mupirocin ointment (BACTROBAN) 2 %  APPLY A SMALL AMOUNT TO AFFECTED AREA ON INSIDE OF NOSE 2 TIMES DAILY AS DIRECTED  . pantoprazole (PROTONIX) 40 MG tablet Take 1 tablet (40 mg total) by mouth daily.  Marland Kitchen spironolactone (ALDACTONE) 50 MG tablet Take 1 tablet (50 mg total) by mouth 2 (two) times daily.   No facility-administered encounter medications on file as of 10/27/2018.      REVIEW OF SYSTEMS  : All other systems reviewed and negative except where noted in the History of Present Illness.   PHYSICAL EXAM: BP (!) 108/50 (BP Location: Left Arm, Patient Position: Sitting, Cuff Size: Normal)   Pulse 96   Temp 97.7 F (36.5 C)   Ht 5' 4"  (1.626 m)   Wt 109 lb (49.4 kg)   BMI 18.71 kg/m  General: Well developed white female in no acute distress Head:  Normocephalic and atraumatic Eyes:  Sclerae anicteric, conjunctiva pink. Ears: Normal auditory acuity Lungs: Coarse BS throughout; no increased WOB. Heart: Regular rate and rhythm; no M/R/G Abdomen: Soft, non-distended.  BS present.  Non-tender. Rectal:  No external abnormalities noted.  DRE revealed some stool in rectum, very black stool on exam glove.  Heme positive. Musculoskeletal: Symmetrical with no gross deformities  Skin: No lesions on visible extremities Extremities: No edema  Neurological: Alert oriented x 4, grossly non-focal Psychological:  Alert and cooperative. Normal mood and affect  ASSESSMENT AND PLAN: *68 year old female with cirrhosis, small esophageal varices seen on EGD in 05/2018.  Now with 3 gram drop in Hgb and very dark stools, heme positive today.  Will check STAT CBC and PT/INR today.  If Hgb continuing to trend down then will need to go to the ED for admission and hospital EGD, transfusion, etc.  As long as it is stable we are currently planning for EGD at the hospital with Dr. [name] early next week.  **The risks, benefits, and alternatives to EGD were discussed with the patient and she consents to proceed.    CC:  Tower, [name], MD  Reviewed and agree with documentation and assessment and plan. Damaris Hippo , MD

## 2018-10-27 NOTE — Patient Instructions (Signed)
You have been scheduled for an endoscopy. Please follow written instructions given to you at your visit today. If you use inhalers (even only as needed), please bring them with you on the day of your procedure. Your physician has requested that you go to www.startemmi.com and enter the access code given to you at your visit today. This web site gives a general overview about your procedure. However, you should still follow specific instructions given to you by our office regarding your preparation for the procedure.   Your provider has requested that you go to the basement level for lab work before leaving today. Press "B" on the elevator. The lab is located at the first door on the left as you exit the elevator.

## 2018-10-27 NOTE — Progress Notes (Signed)
Spoke with patient about Endoscopy procedure on Monday 7/20 and ensured patient has remained quarantined since COVID test, and that she has not been around anyone sick nor has had symptoms of being sick. All questions addressed.

## 2018-10-27 NOTE — Telephone Encounter (Signed)
The pt was advised that if she develops weakness, dizziness or stools change from the way they are today she needs to go to the ED. The pt has been advised of the information and verbalized understanding.

## 2018-10-30 ENCOUNTER — Ambulatory Visit (HOSPITAL_COMMUNITY): Payer: Medicare HMO | Admitting: Anesthesiology

## 2018-10-30 ENCOUNTER — Encounter (HOSPITAL_COMMUNITY)
Admission: RE | Disposition: A | Discharge status: Home / Self Care | Payer: Self-pay | Source: Home / Self Care | Attending: Gastroenterology

## 2018-10-30 ENCOUNTER — Ambulatory Visit (HOSPITAL_COMMUNITY)
Admission: RE | Admit: 2018-10-30 | Discharge: 2018-10-30 | Disposition: A | Discharge status: Home / Self Care | Payer: Medicare HMO | Attending: Gastroenterology | Admitting: Gastroenterology

## 2018-10-30 ENCOUNTER — Other Ambulatory Visit: Payer: Self-pay

## 2018-10-30 ENCOUNTER — Encounter (HOSPITAL_COMMUNITY): Payer: Self-pay | Admitting: Gastroenterology

## 2018-10-30 ENCOUNTER — Other Ambulatory Visit: Payer: Self-pay | Admitting: *Deleted

## 2018-10-30 DIAGNOSIS — K766 Portal hypertension: Secondary | ICD-10-CM

## 2018-10-30 DIAGNOSIS — K922 Gastrointestinal hemorrhage, unspecified: Secondary | ICD-10-CM

## 2018-10-30 DIAGNOSIS — Z853 Personal history of malignant neoplasm of breast: Secondary | ICD-10-CM | POA: Insufficient documentation

## 2018-10-30 DIAGNOSIS — Z888 Allergy status to other drugs, medicaments and biological substances status: Secondary | ICD-10-CM | POA: Diagnosis not present

## 2018-10-30 DIAGNOSIS — R195 Other fecal abnormalities: Secondary | ICD-10-CM

## 2018-10-30 DIAGNOSIS — K3189 Other diseases of stomach and duodenum: Secondary | ICD-10-CM | POA: Diagnosis not present

## 2018-10-30 DIAGNOSIS — Z79899 Other long term (current) drug therapy: Secondary | ICD-10-CM | POA: Diagnosis not present

## 2018-10-30 DIAGNOSIS — F17210 Nicotine dependence, cigarettes, uncomplicated: Secondary | ICD-10-CM | POA: Diagnosis not present

## 2018-10-30 DIAGNOSIS — Z885 Allergy status to narcotic agent status: Secondary | ICD-10-CM | POA: Diagnosis not present

## 2018-10-30 DIAGNOSIS — I8510 Secondary esophageal varices without bleeding: Secondary | ICD-10-CM | POA: Diagnosis not present

## 2018-10-30 DIAGNOSIS — K449 Diaphragmatic hernia without obstruction or gangrene: Secondary | ICD-10-CM | POA: Insufficient documentation

## 2018-10-30 DIAGNOSIS — K210 Gastro-esophageal reflux disease with esophagitis: Secondary | ICD-10-CM | POA: Insufficient documentation

## 2018-10-30 DIAGNOSIS — D500 Iron deficiency anemia secondary to blood loss (chronic): Secondary | ICD-10-CM | POA: Diagnosis not present

## 2018-10-30 DIAGNOSIS — I8500 Esophageal varices without bleeding: Secondary | ICD-10-CM | POA: Diagnosis not present

## 2018-10-30 DIAGNOSIS — K7030 Alcoholic cirrhosis of liver without ascites: Secondary | ICD-10-CM | POA: Diagnosis not present

## 2018-10-30 DIAGNOSIS — Z923 Personal history of irradiation: Secondary | ICD-10-CM | POA: Diagnosis not present

## 2018-10-30 DIAGNOSIS — Z9221 Personal history of antineoplastic chemotherapy: Secondary | ICD-10-CM | POA: Diagnosis not present

## 2018-10-30 DIAGNOSIS — K7460 Unspecified cirrhosis of liver: Secondary | ICD-10-CM

## 2018-10-30 DIAGNOSIS — K921 Melena: Secondary | ICD-10-CM

## 2018-10-30 DIAGNOSIS — Z800 Family history of malignant neoplasm of digestive organs: Secondary | ICD-10-CM | POA: Diagnosis not present

## 2018-10-30 HISTORY — PX: ESOPHAGOGASTRODUODENOSCOPY (EGD) WITH PROPOFOL: SHX5813

## 2018-10-30 SURGERY — ESOPHAGOGASTRODUODENOSCOPY (EGD) WITH PROPOFOL
Anesthesia: Monitor Anesthesia Care

## 2018-10-30 MED ORDER — PROPOFOL 10 MG/ML IV BOLUS
INTRAVENOUS | Status: DC | PRN
  Administered 2018-10-30 (×2): 20 mg via INTRAVENOUS

## 2018-10-30 MED ORDER — ONDANSETRON HCL 4 MG/2ML IJ SOLN
INTRAMUSCULAR | Status: DC | PRN
  Administered 2018-10-30: 4 mg via INTRAVENOUS

## 2018-10-30 MED ORDER — NADOLOL 20 MG PO TABS: 20.0000 mg | ORAL_TABLET | Freq: Every day | ORAL | 11 refills | Status: DC

## 2018-10-30 MED ORDER — SODIUM CHLORIDE 0.9 % IV SOLN: INTRAVENOUS | Status: DC

## 2018-10-30 MED ORDER — LIDOCAINE 2% (20 MG/ML) 5 ML SYRINGE
INTRAMUSCULAR | Status: DC | PRN
  Administered 2018-10-30: 40 mg via INTRAVENOUS

## 2018-10-30 MED ORDER — LACTATED RINGERS IV SOLN
INTRAVENOUS | Status: DC
  Administered 2018-10-30: 1000 mL via INTRAVENOUS

## 2018-10-30 MED ORDER — PROPOFOL 10 MG/ML IV BOLUS
INTRAVENOUS | Status: AC
  Filled 2018-10-30: qty 40

## 2018-10-30 MED ORDER — PROPOFOL 500 MG/50ML IV EMUL
INTRAVENOUS | Status: DC | PRN
  Administered 2018-10-30: 150 ug/kg/min via INTRAVENOUS

## 2018-10-30 SURGICAL SUPPLY — 15 items

## 2018-10-30 NOTE — Op Note (Signed)
[facility] Patient Name: Christine Barton Procedure Date: 10/30/2018 MRN: [id number] Attending MD: Mauri Pole , MD Date of Birth: [birth year] CSN: [id number] Age: 68 Admit Type: Outpatient Procedure:                Upper GI endoscopy Indications:              Recent gastrointestinal bleeding, Suspected upper                            gastrointestinal bleeding with history of melena                            and anemia Providers:                Mauri Pole, MD, Cleda Daub, RN, Elspeth Cho Tech., Technician, Marla Roe, CRNA Referring MD:              Medicines:                Monitored Anesthesia Care Complications:            No immediate complications. Estimated Blood Loss:     Estimated blood loss was minimal. Procedure:                Pre-Anesthesia Assessment:                           - Prior to the procedure, a History and Physical                            was performed, and patient medications and                            allergies were reviewed. The patient's tolerance of                            previous anesthesia was also reviewed. The risks                            and benefits of the procedure and the sedation                            options and risks were discussed with the patient.                            All questions were answered, and informed consent                            was obtained. Prior Anticoagulants: The patient has                            taken no previous anticoagulant or antiplatelet  agents. ASA Grade Assessment: III - A patient with                            severe systemic disease. After reviewing the risks                            and benefits, the patient was deemed in                            satisfactory condition to undergo the procedure.                           After obtaining informed consent, the endoscope was               passed under direct vision. Throughout the                            procedure, the patient's blood pressure, pulse, and                            oxygen saturations were monitored continuously. The                            GIF-H190 ([id number]) Olympus gastroscope was                            introduced through the mouth, and advanced to the                            second part of duodenum. The upper GI endoscopy was                            accomplished without difficulty. The patient                            tolerated the procedure well. Scope In: Scope Out: Findings:      Three columns of non-bleeding grade II varices were found in the lower       third of the esophagus, 32 to 38 cm from the incisors. They were less       than 5 mm in largest diameter. No stigmata of recent bleeding were       evident and no red wale signs were present.      A small hiatal hernia was present.      Moderate portal hypertensive gastropathy was found in the entire       examined stomach, oozing with contact by scope. No gastric varices      The examined duodenum was normal. Impression:               - Non-bleeding grade II esophageal varices.                           - Small hiatal hernia.                           - Portal  hypertensive gastropathy likely etiology                            for GI blood loss anemia and melena.                           - Normal examined duodenum.                           - No specimens collected. Moderate Sedation:      Not Applicable - Patient had care per Anesthesia. Recommendation:           - Patient has a contact number available for                            emergencies. The signs and symptoms of potential                            delayed complications were discussed with the                            patient. Return to normal activities tomorrow.                            Written discharge instructions were provided to the                             patient.                           - Resume previous diet.                           - Continue present medications.                           - No aspirin, ibuprofen, naproxen, or other                            non-steroidal anti-inflammatory drugs.                           - Start Nadolol 69m daily (non selective beta                            blocker ) will plan to titrate dosage by the heart                            rate with goal HR 50-60.                           - Alcohol cessation                           - Repeat CBC in 2 weeks                           -  Continue Pantoprazole daily                           - Continue oral iron with meals                           - Return to GI office in 4 weeks with APP. Procedure Code(s):        --- Professional ---                           [phone], Esophagogastroduodenoscopy, flexible,                            transoral; diagnostic, including collection of                            specimen(s) by brushing or washing, when performed                            (separate procedure) Diagnosis Code(s):        --- Professional ---                           I85.00, Esophageal varices without bleeding                           K44.9, Diaphragmatic hernia without obstruction or                            gangrene                           K76.6, Portal hypertension                           K31.89, Other diseases of stomach and duodenum                           K92.2, Gastrointestinal hemorrhage, unspecified CPT copyright 2019 American Medical [employer]. All rights reserved. The codes documented in this report are preliminary and upon coder review may  be revised to meet current compliance requirements. Mauri Pole, MD 10/30/2018 10:28:59 AM This report has been signed electronically. Number of Addenda: 0

## 2018-10-30 NOTE — Transfer of Care (Signed)
Immediate Anesthesia Transfer of Care Note  Patient: Christine Barton  Procedure(s) Performed: ESOPHAGOGASTRODUODENOSCOPY (EGD) WITH PROPOFOL (N/A )  Patient Location: PACU  Anesthesia Type:MAC  Level of Consciousness: sedated  Airway & Oxygen Therapy: Patient Spontanous Breathing and Patient connected to nasal cannula oxygen  Post-op Assessment: Report given to RN and Post -op Vital signs reviewed and stable  Post vital signs: Reviewed and stable  Last Vitals:  Vitals Value Taken Time  BP    Temp    Pulse    Resp    SpO2      Last Pain:  Vitals:   10/30/18 0918  TempSrc: Temporal  PainSc: 0-No pain         Complications: No apparent anesthesia complications

## 2018-10-30 NOTE — Anesthesia Procedure Notes (Signed)
Date/Time: 10/30/2018 10:08 AM Performed by: Talbot Grumbling, CRNA Oxygen Delivery Method: Nasal cannula

## 2018-10-30 NOTE — Interval H&P Note (Signed)
History and Physical Interval Note:  10/30/2018 9:09 AM  Christine Barton  has presented today for surgery, with the diagnosis of anemia, esphogeal varices possible banding.  The various methods of treatment have been discussed with the patient and family. After consideration of risks, benefits and other options for treatment, the patient has consented to  Procedure(s): ESOPHAGOGASTRODUODENOSCOPY (EGD) WITH PROPOFOL (N/A) as a surgical intervention.  The patient's history has been reviewed, patient examined, no change in status, stable for surgery.  I have reviewed the patient's chart and labs.  Questions were answered to the patient's satisfaction.     Kavitha Nandigam

## 2018-10-30 NOTE — Anesthesia Postprocedure Evaluation (Signed)
Anesthesia Post Note  Patient: Christine Barton  Procedure(s) Performed: ESOPHAGOGASTRODUODENOSCOPY (EGD) WITH PROPOFOL (N/A )     Patient location during evaluation: PACU Anesthesia Type: MAC Level of consciousness: awake and alert Pain management: pain level controlled Vital Signs Assessment: post-procedure vital signs reviewed and stable Respiratory status: spontaneous breathing, nonlabored ventilation and respiratory function stable Cardiovascular status: stable and blood pressure returned to baseline Anesthetic complications: no    Last Vitals:  Vitals:   10/30/18 1040 10/30/18 1050  BP: (!) 121/55 (!) 132/55  Pulse: 89 87  Resp: 15 16  Temp:    SpO2: 100% 100%    Last Pain:  Vitals:   10/30/18 1050  TempSrc:   PainSc: 0-No pain                 Audry Pili

## 2018-10-30 NOTE — Anesthesia Preprocedure Evaluation (Addendum)
Anesthesia Evaluation  Patient identified by MRN, date of birth, ID band Patient awake    Reviewed: Allergy & Precautions, NPO status , Patient's Chart, lab work & pertinent test results  History of Anesthesia Complications Negative for: history of anesthetic complications  Airway Mallampati: II  TM Distance: >3 FB Neck ROM: Full    Dental  (+) Upper Dentures, Lower Dentures   Pulmonary Current Smoker,   Hx right spontaneous PTX    breath sounds clear to auscultation       Cardiovascular negative cardio ROS   Rhythm:Regular Rate:Tachycardia     Neuro/Psych negative neurological ROS  negative psych ROS   GI/Hepatic GERD  Medicated and Controlled,(+) Cirrhosis   Esophageal Varices  substance abuse  alcohol use,   Endo/Other   Pre-DM Hyponatremia   Renal/GU negative Renal ROS     Musculoskeletal  (+) Arthritis ,   Abdominal   Peds  Hematology  (+) anemia ,   Anesthesia Other Findings   Reproductive/Obstetrics  Breast cancer                             Anesthesia Physical Anesthesia Plan  ASA: III  Anesthesia Plan: MAC   Post-op Pain Management:    Induction: Intravenous  PONV Risk Score and Plan: 2 and Propofol infusion and Treatment may vary due to age or medical condition  Airway Management Planned: Nasal Cannula and Natural Airway  Additional Equipment: None  Intra-op Plan:   Post-operative Plan:   Informed Consent: I have reviewed the patients History and Physical, chart, labs and discussed the procedure including the risks, benefits and alternatives for the proposed anesthesia with the patient or authorized representative who has indicated his/her understanding and acceptance.       Plan Discussed with: CRNA and Anesthesiologist  Anesthesia Plan Comments:        Anesthesia Quick Evaluation

## 2018-10-30 NOTE — Addendum Note (Signed)
Addendum  created 10/30/18 1529 by Talbot Grumbling, CRNA   Charge Capture section accepted

## 2018-10-30 NOTE — Discharge Instructions (Signed)
YOU HAD AN ENDOSCOPIC PROCEDURE TODAY: Refer to the procedure report and other information in the discharge instructions given to you for any specific questions about what was found during the examination. If this information does not answer your questions, please call [place] office at [phone] to clarify.   YOU SHOULD EXPECT: Some feelings of bloating in the abdomen. Passage of more gas than usual. Walking can help get rid of the air that was put into your GI tract during the procedure and reduce the bloating. If you had a lower endoscopy (such as a colonoscopy or flexible sigmoidoscopy) you may notice spotting of blood in your stool or on the toilet paper. Some abdominal soreness may be present for a day or two, also.  DIET: Your first meal following the procedure should be a light meal and then it is ok to progress to your normal diet. A half-sandwich or bowl of soup is an example of a good first meal. Heavy or fried foods are harder to digest and may make you feel nauseous or bloated. Drink plenty of fluids but you should avoid alcoholic beverages for 24 hours. If you had a esophageal dilation, please see attached instructions for diet.    ACTIVITY: Your care partner should take you home directly after the procedure. You should plan to take it easy, moving slowly for the rest of the day. You can resume normal activity the day after the procedure however YOU SHOULD NOT DRIVE, use power tools, machinery or perform tasks that involve climbing or major physical exertion for 24 hours (because of the sedation medicines used during the test).   SYMPTOMS TO REPORT IMMEDIATELY: A gastroenterologist can be reached at any hour. Please call [phone]  for any of the following symptoms:   Following upper endoscopy (EGD, EUS, ERCP, esophageal dilation) Vomiting of blood or coffee ground material  New, significant abdominal pain  New, significant chest pain or pain under the shoulder blades  Painful or  persistently difficult swallowing  New shortness of breath  Black, tarry-looking or red, bloody stools  FOLLOW UP:  If any biopsies were taken you will be contacted by phone or by letter within the next 1-3 weeks. Call [phone]  if you have not heard about the biopsies in 3 weeks.  Please also call with any specific questions about appointments or follow up tests.  

## 2018-10-31 DIAGNOSIS — H524 Presbyopia: Secondary | ICD-10-CM | POA: Diagnosis not present

## 2018-11-01 ENCOUNTER — Encounter (HOSPITAL_COMMUNITY): Payer: Self-pay | Admitting: Gastroenterology

## 2018-11-02 NOTE — Progress Notes (Signed)
Reviewed and agree with documentation and assessment and plan. K. Veena Nandigam , MD   

## 2018-11-03 ENCOUNTER — Telehealth: Payer: Self-pay | Admitting: Gastroenterology

## 2018-11-03 NOTE — Telephone Encounter (Signed)
  Doc of the Day Dr Woodward Ku patient. Spoke with the patient.Calling with complaints of black stools, formed and black in color. States she has had black stools ongoing before the EGD. She is taking iron twice a day "without missing any." She denies any SOB, chest discomfort, abdominal pain, nausea or lightheadedness. She has ongoing fatigue. On 11/06/18 she will be getting her labs for her physical on 11/07/18 with her PCP. She declines to come for labs today. She opts to monitor for any of the red flag symptoms and understands to go to the ER if this occurs. Maintain hydration. Continue medications. Is this okay?

## 2018-11-03 NOTE — Telephone Encounter (Signed)
I reviewed her prior records including her recent endoscopy. She should be taking pantoprazole and nadolol. Her stools may be dark from iron supplements. Rechecking her hemoglobin is the best evaluation. If she has progressive symptoms (worsening fatigue, weakness, headache, irritability, exercise intolerance, exertional dyspnea, vertigo, chest pain) prior to 11/07/18 she should go directly to the ER.

## 2018-11-03 NOTE — Telephone Encounter (Signed)
Patient expresses understanding of this advise.

## 2018-11-06 ENCOUNTER — Ambulatory Visit (INDEPENDENT_AMBULATORY_CARE_PROVIDER_SITE_OTHER): Payer: Medicare HMO

## 2018-11-06 ENCOUNTER — Other Ambulatory Visit (INDEPENDENT_AMBULATORY_CARE_PROVIDER_SITE_OTHER): Payer: Medicare HMO

## 2018-11-06 ENCOUNTER — Other Ambulatory Visit: Payer: Medicare HMO

## 2018-11-06 VITALS — Ht 65.0 in | Wt 112.0 lb

## 2018-11-06 DIAGNOSIS — Z0000 Encounter for general adult medical examination without abnormal findings: Secondary | ICD-10-CM

## 2018-11-06 DIAGNOSIS — R6889 Other general symptoms and signs: Secondary | ICD-10-CM | POA: Diagnosis not present

## 2018-11-06 DIAGNOSIS — E7800 Pure hypercholesterolemia, unspecified: Secondary | ICD-10-CM | POA: Diagnosis not present

## 2018-11-06 DIAGNOSIS — R7303 Prediabetes: Secondary | ICD-10-CM

## 2018-11-06 DIAGNOSIS — K7031 Alcoholic cirrhosis of liver with ascites: Secondary | ICD-10-CM | POA: Diagnosis not present

## 2018-11-06 LAB — HEMOGLOBIN A1C: Hgb A1c MFr Bld: 7 % — ABNORMAL HIGH (ref 4.6–6.5)

## 2018-11-06 LAB — COMPREHENSIVE METABOLIC PANEL
ALT: 10 U/L (ref 0–35)
AST: 24 U/L (ref 0–37)
Albumin: 4 g/dL (ref 3.5–5.2)
Alkaline Phosphatase: 101 U/L (ref 39–117)
BUN: 25 mg/dL — ABNORMAL HIGH (ref 6–23)
CO2: 21 mEq/L (ref 19–32)
Calcium: 10.1 mg/dL (ref 8.4–10.5)
Chloride: 96 mEq/L (ref 96–112)
Creatinine, Ser: 1.3 mg/dL — ABNORMAL HIGH (ref 0.40–1.20)
GFR: 40.75 mL/min — ABNORMAL LOW (ref >60.00)
Glucose, Bld: 131 mg/dL — ABNORMAL HIGH (ref 70–99)
Potassium: 5.3 mEq/L — ABNORMAL HIGH (ref 3.5–5.1)
Sodium: 127 mEq/L — ABNORMAL LOW (ref 135–145)
Total Bilirubin: 0.7 mg/dL (ref 0.2–1.2)
Total Protein: 7.3 g/dL (ref 6.0–8.3)

## 2018-11-06 LAB — LIPID PANEL
Cholesterol: 128 mg/dL (ref 0–200)
HDL: 32.6 mg/dL — ABNORMAL LOW (ref >39.00)
LDL Cholesterol: 70 mg/dL (ref 0–99)
NonHDL: 95.34
Total CHOL/HDL Ratio: 4
Triglycerides: 125 mg/dL (ref 0.0–149.0)
VLDL: 25 mg/dL (ref 0.0–40.0)

## 2018-11-06 NOTE — Progress Notes (Signed)
PCP notes:  Health Maintenance:  Foot exam, eye exam (scheduled for end of August), mammogram (scheduled 12/04/2018)  Abnormal Screenings:  Reports 8 falls in past year. States it has been corrected. She has not fallen lately  Patient concerns:  None  Nurse concerns:  Falls  Next PCP appt.: 11/07/2018 at 8:30

## 2018-11-06 NOTE — Progress Notes (Signed)
Subjective:   Christine Barton is a 68 y.o. female who presents for Medicare Annual (Subsequent) preventive examination.  This visit type was conducted due to national recommendations for restrictions regarding the COVID-19 Pandemic (e.g. social distancing). This format is felt to be most appropriate for this patient at this time. All issues noted in this document were discussed and addressed. No physical exam was performed (except for noted visual exam findings with Video Visits). This patient, [name], has given permission to perform this visit via telephone. Vital signs may be absent or patient reported.  Patient location:  At home  Nurse location:  At home     Review of Systems:  n/a Cardiac Risk Factors include: dyslipidemia;smoking/ tobacco exposure;sedentary lifestyle     Objective:     Vitals: Ht 5' 5"  (1.651 m) Comment: per patient  Wt 112 lb (50.8 kg) Comment: per patient  BMI 18.64 kg/m   Body mass index is 18.64 kg/m.  Advanced Directives 11/06/2018 10/30/2018 10/31/2017 11/26/2016 11/02/2014  Does Patient Have a Medical Advance Directive? No No No No No  Would patient like information on creating a medical advance directive? - No - Patient declined No - Patient declined - Yes - Educational materials given    Tobacco Social History   Tobacco Use  Smoking Status Current Every Day Smoker  . Packs/day: 1.00  . Years: 53.00  . Pack years: 53.00  . Types: Cigarettes  Smokeless Tobacco Never Used  Tobacco Comment   tobacco info given 04/27/2018     Ready to quit: No Counseling given: Not Answered Comment: tobacco info given 04/27/2018   Clinical Intake:  Pre-visit preparation completed: Yes  Pain : No/denies pain     Nutritional Status: BMI of 19-24  Normal Nutritional Risks: None Diabetes: No  How often do you need to have someone help you when you read instructions, pamphlets, or other written materials from your doctor or pharmacy?: 1 -  Never What is the last grade level you completed in school?: GED  Interpreter Needed?: No  Information entered by :: NAllen LPN  Past Medical History:  Diagnosis Date  . Alcohol abuse, unspecified   . Breast cancer ([place]) 1998   Right  . Cataract    left eye  . Cervical spondylosis 2006   MRI  . Degeneration of cervical intervertebral disc 2006   MRI  . Diverticulosis of colon (without mention of hemorrhage)   . Hyperpotassemia   . Microscopic hematuria   . Mononeuritis of unspecified site   . Nonspecific abnormal results of liver function study   . Other abnormal glucose   . Other and unspecified hyperlipidemia    no per pt  . Other chronic nonalcoholic liver disease   . Personal history of chemotherapy   . Personal history of malignant neoplasm of breast   . Personal history of radiation therapy   . Pneumothorax, acute    right, spontaneous  . Tobacco use disorder   . Unspecified vitamin D deficiency    Past Surgical History:  Procedure Laterality Date  . BREAST BIOPSY  9/03   Right  . BREAST LUMPECTOMY Right 1998  . BREAST LUMPECTOMY  1998  . CHEST TUBE INSERTION  11/02/2014  . COLONOSCOPY    . ESOPHAGOGASTRODUODENOSCOPY (EGD) WITH PROPOFOL N/A 10/30/2018   Procedure: ESOPHAGOGASTRODUODENOSCOPY (EGD) WITH PROPOFOL;  Surgeon: Mauri Pole, MD;  Location: [facility];  Service: Endoscopy;  Laterality: N/A;  . EYE SURGERY  02/2017   cataract  extraction with lens implant  . TUBAL LIGATION     Family History  Problem Relation Age of Onset  . Heart failure Father   . Colon cancer Maternal Uncle   . Stroke Mother   . Esophageal cancer Neg Hx   . Rectal cancer Neg Hx   . Stomach cancer Neg Hx    Social History   Socioeconomic History  . Marital status: Single    Spouse name: Not on file  . Number of children: 1  . Years of education: Not on file  . Highest education level: Not on file  Occupational History  . Occupation: retired    Fish farm manager:  [employer]  Social Needs  . Financial resource strain: Not hard at all  . Food insecurity    Worry: Never true    Inability: Never true  . Transportation needs    Medical: No    Non-medical: No  Tobacco Use  . Smoking status: Current Every Day Smoker    Packs/day: 1.00    Years: 53.00    Pack years: 53.00    Types: Cigarettes  . Smokeless tobacco: Never Used  . Tobacco comment: tobacco info given 04/27/2018  Substance and Sexual Activity  . Alcohol use: Not Currently    Comment: occasional use  . Drug use: No  . Sexual activity: Not Currently  Lifestyle  . Physical activity    Days per week: 0 days    Minutes per session: 0 min  . Stress: Only a little  Relationships  . Social Herbalist on phone: Not on file    Gets together: Not on file    Attends religious service: Not on file    Active member of club or organization: Not on file    Attends meetings of clubs or organizations: Not on file    Relationship status: Not on file  Other Topics Concern  . Not on file  Social History Narrative   Divorced      1 child      Works at [employer] Encounter Medications as of 11/06/2018  Medication Sig  . acetaminophen (TYLENOL) 500 MG tablet Take 500 mg by mouth 2 (two) times a day. Uses only as needed  . cyclobenzaprine (FLEXERIL) 10 MG tablet TAKE 0.5 TABLETS (5 MG TOTAL) BY MOUTH AT BEDTIME AS NEEDED FOR MUSCLE SPASMS.  Marland Kitchen doxylamine, Sleep, (UNISOM) 25 MG tablet Take 25 mg by mouth at bedtime as needed for sleep.   . furosemide (LASIX) 20 MG tablet Take 40 mg by mouth daily.  . iron polysaccharides (NIFEREX) 150 MG capsule Take 1 capsule (150 mg total) by mouth 2 (two) times daily.  Marland Kitchen lactulose (CHRONULAC) 10 GM/15ML solution Take 15 mLs (10 g total) by mouth 3 (three) times daily. (Patient taking differently: Take 20 g by mouth See admin instructions. Take 20 g by mouth twice daily, may take a third 20 g dose as needed for  constipation)  . mupirocin ointment (BACTROBAN) 2 % APPLY A SMALL AMOUNT TO AFFECTED AREA ON INSIDE OF NOSE 2 TIMES DAILY AS DIRECTED (Patient taking differently: Place 1 application into the nose 2 (two) times daily as needed (sores). )  . nadolol (CORGARD) 20 MG tablet Take 1 tablet (20 mg total) by mouth daily.  . pantoprazole (PROTONIX) 40 MG tablet Take 1 tablet (40 mg total) by mouth daily.  [name] Glycol-Propyl Glycol (SYSTANE OP) Place 1  drop into the left eye daily as needed (dryness).  Marland Kitchen spironolactone (ALDACTONE) 50 MG tablet Take 1 tablet (50 mg total) by mouth 2 (two) times daily.   No facility-administered encounter medications on file as of 11/06/2018.     Activities of Daily Living In your present state of health, do you have any difficulty performing the following activities: 11/06/2018  Hearing? Y  Comment somewhat  Vision? Y  Difficulty concentrating or making decisions? Y  Comment forgetfulness  Walking or climbing stairs? Y  Comment wears her out  Dressing or bathing? N  Doing errands, shopping? N  Preparing Food and eating ? N  Using the Toilet? N  In the past six months, have you accidently leaked urine? Y  Comment when coughing  Do you have problems with loss of bowel control? N  Managing your Medications? N  Managing your Finances? N  Housekeeping or managing your Housekeeping? N  Some recent data might be hidden    Patient Care Team: Tower, [name], MD as PCP - General    Assessment:   This is a routine wellness examination for [facility].  Exercise Activities and Dietary recommendations Current Exercise Habits: The patient does not participate in regular exercise at present  Goals    . Exercise 150 min/wk Moderate Activity     11/06/2018, would like to start exercising 3 days a week    . Increase physical activity     Starting 10/31/2017, I will continue to exercise for 60 minutes twice weekly.     . Patient Stated     11/06/2018, would like to  regulate sleep    . Quit Smoking     11/06/2018, would like to quit       Fall Risk Fall Risk  11/06/2018 10/31/2017 10/26/2016  Falls in the past year? 1 No Yes  Number falls in past yr: 1 - 1  Comment has to do with liver - -  Injury with Fall? 0 - Yes  Risk for fall due to : History of fall(s);Medication side effect - -  Follow up Falls evaluation completed;Falls prevention discussed - -   Is the patient's home free of loose throw rugs in walkways, pet beds, electrical cords, etc?   yes      Grab bars in the bathroom? no      Handrails on the stairs?   no      Adequate lighting?   yes  Timed Get Up and Go performed: n/a  Depression Screen PHQ 2/9 Scores 11/06/2018 10/31/2017 10/26/2016  PHQ - 2 Score 0 0 0  PHQ- 9 Score 3 0 3     Cognitive Function MMSE - Mini Mental State Exam 11/06/2018 10/31/2017  Orientation to time 4 5  Orientation to Place 5 5  Registration 3 3  Attention/ Calculation 2 0  Attention/Calculation-comments missed r,o,w -  Recall 2 3  Recall-comments missed penny -  Language- name 2 objects 0 0  Language- repeat 1 1  Language- follow 3 step command 0 3  Language- read & follow direction 0 0  Write a sentence 0 0  Copy design 0 0  Total score 17 20   Mini Cog  Mini-Cog screen was completed. Maximum score is 22. A value of 0 denotes this part of the MMSE was not completed or the patient failed this part of the Mini-Cog screening.       Immunization History  Administered Date(s) Administered  . Hep A / Hep B  09/05/2018, 10/10/2018  . Influenza Split 03/10/2011  . Influenza Whole 02/11/2000, 02/06/2016  . Influenza,inj,Quad PF,6+ Mos 02/16/2017, 04/18/2018  . Influenza-Unspecified 02/11/2016  . Pneumococcal Conjugate-13 10/26/2016  . Pneumococcal Polysaccharide-23 06/28/2008, 11/07/2017  . Td 05/21/2002  . Tdap 09/12/2013  . Zoster 08/24/2011    Qualifies for Shingles Vaccine? yes  Screening Tests Health Maintenance  Topic Date Due  .  FOOT EXAM  11/01/2015  . OPHTHALMOLOGY EXAM  06/11/2018  . INFLUENZA VACCINE  11/11/2018  . MAMMOGRAM  11/30/2018  . HEMOGLOBIN A1C  04/25/2019  . URINE MICROALBUMIN  04/25/2019  . COLONOSCOPY  12/01/2019  . TETANUS/TDAP  09/13/2023  . DEXA SCAN  Completed  . Hepatitis C Screening  Completed  . PNA vac Low Risk Adult  Completed    Cancer Screenings: Lung: Low Dose CT Chest recommended if Age 34-80 years, 30 pack-year currently smoking OR have quit w/in 15years. Patient does qualify. Breast:  Up to date on Mammogram? Yes   Up to date of Bone Density/Dexa? Yes Colorectal: up to date  Additional Screenings: : Hepatitis C Screening: 04/2018     Plan:    Patient wants to quit smoking, start exercising, and regulate her sleep.   I have personally reviewed and noted the following in the patient's chart:   . Medical and social history . Use of alcohol, tobacco or illicit drugs  . Current medications and supplements . Functional ability and status . Nutritional status . Physical activity . Advanced directives . List of other physicians . Hospitalizations, surgeries, and ER visits in previous 12 months . Vitals . Screenings to include cognitive, depression, and falls . Referrals and appointments  In addition, I have reviewed and discussed with patient certain preventive protocols, quality metrics, and best practice recommendations. A written personalized care plan for preventive services as well as general preventive health recommendations were provided to patient.     Kellie Simmering, LPN  [id number]

## 2018-11-06 NOTE — Patient Instructions (Signed)
Christine Barton , Thank you for taking time to come for your Medicare Wellness Visit. I appreciate your ongoing commitment to your health goals. Please review the following plan we discussed and let me know if I can assist you in the future.   Screening recommendations/referrals: Colonoscopy: 11/2016 Mammogram: scheduled for 12/04/2018 Bone Density: 10/2016 Recommended yearly ophthalmology/optometry visit for glaucoma screening and checkup Recommended yearly dental visit for hygiene and checkup  Vaccinations: Influenza vaccine: 04/2018 Pneumococcal vaccine: 10/2017 Tdap vaccine: 09/2013 Shingles vaccine: discussed    Advanced directives: Advance directive discussed with you today. Even though you declined this today please call our office should you change your mind and we can give you the proper paperwork for you to fill out.   Conditions/risks identified: smoking  Next appointment: 11/07/2018 at 8:30   Preventive Care 68 Years and Older, Female Preventive care refers to lifestyle choices and visits with your health care provider that can promote health and wellness. What does preventive care include?  A yearly physical exam. This is also called an annual well check.  Dental exams once or twice a year.  Routine eye exams. Ask your health care provider how often you should have your eyes checked.  Personal lifestyle choices, including:  Daily care of your teeth and gums.  Regular physical activity.  Eating a healthy diet.  Avoiding tobacco and drug use.  Limiting alcohol use.  Practicing safe sex.  Taking low-dose aspirin every day.  Taking vitamin and mineral supplements as recommended by your health care provider. What happens during an annual well check? The services and screenings done by your health care provider during your annual well check will depend on your age, overall health, lifestyle risk factors, and family history of disease. Counseling  Your health care  provider may ask you questions about your:  Alcohol use.  Tobacco use.  Drug use.  Emotional well-being.  Home and relationship well-being.  Sexual activity.  Eating habits.  History of falls.  Memory and ability to understand (cognition).  Work and work Statistician.  Reproductive health. Screening  You may have the following tests or measurements:  Height, weight, and BMI.  Blood pressure.  Lipid and cholesterol levels. These may be checked every 5 years, or more frequently if you are over 77 years old.  Skin check.  Lung cancer screening. You may have this screening every year starting at age 54 if you have a 30-pack-year history of smoking and currently smoke or have quit within the past 15 years.  Fecal occult blood test (FOBT) of the stool. You may have this test every year starting at age 53.  Flexible sigmoidoscopy or colonoscopy. You may have a sigmoidoscopy every 5 years or a colonoscopy every 10 years starting at age 61.  Hepatitis C blood test.  Hepatitis B blood test.  Sexually transmitted disease (STD) testing.  Diabetes screening. This is done by checking your blood sugar (glucose) after you have not eaten for a while (fasting). You may have this done every 1-3 years.  Bone density scan. This is done to screen for osteoporosis. You may have this done starting at age 90.  Mammogram. This may be done every 1-2 years. Talk to your health care provider about how often you should have regular mammograms. Talk with your health care provider about your test results, treatment options, and if necessary, the need for more tests. Vaccines  Your health care provider may recommend certain vaccines, such as:  Influenza vaccine. This is  recommended every year.  Tetanus, diphtheria, and acellular pertussis (Tdap, Td) vaccine. You may need a Td booster every 10 years.  Zoster vaccine. You may need this after age 61.  Pneumococcal 13-valent conjugate (PCV13)  vaccine. One dose is recommended after age 37.  Pneumococcal polysaccharide (PPSV23) vaccine. One dose is recommended after age 42. Talk to your health care provider about which screenings and vaccines you need and how often you need them. This information is not intended to replace advice given to you by your health care provider. Make sure you discuss any questions you have with your health care provider. Document Released: 04/25/2015 Document Revised: 12/17/2015 Document Reviewed: 01/28/2015 Elsevier Interactive Patient Education  2017 [place] Prevention in the Home Falls can cause injuries. They can happen to people of all ages. There are many things you can do to make your home safe and to help prevent falls. What can I do on the outside of my home?  Regularly fix the edges of walkways and driveways and fix any cracks.  Remove anything that might make you trip as you walk through a door, such as a raised step or threshold.  Trim any bushes or trees on the path to your home.  Use bright outdoor lighting.  Clear any walking paths of anything that might make someone trip, such as rocks or tools.  Regularly check to see if handrails are loose or broken. Make sure that both sides of any steps have handrails.  Any raised decks and porches should have guardrails on the edges.  Have any leaves, snow, or ice cleared regularly.  Use sand or salt on walking paths during winter.  Clean up any spills in your garage right away. This includes oil or grease spills. What can I do in the bathroom?  Use night lights.  Install grab bars by the toilet and in the tub and shower. Do not use towel bars as grab bars.  Use non-skid mats or decals in the tub or shower.  If you need to sit down in the shower, use a plastic, non-slip stool.  Keep the floor dry. Clean up any water that spills on the floor as soon as it happens.  Remove soap buildup in the tub or shower regularly.   Attach bath mats securely with double-sided non-slip rug tape.  Do not have throw rugs and other things on the floor that can make you trip. What can I do in the bedroom?  Use night lights.  Make sure that you have a light by your bed that is easy to reach.  Do not use any sheets or blankets that are too big for your bed. They should not hang down onto the floor.  Have a firm chair that has side arms. You can use this for support while you get dressed.  Do not have throw rugs and other things on the floor that can make you trip. What can I do in the kitchen?  Clean up any spills right away.  Avoid walking on wet floors.  Keep items that you use a lot in easy-to-reach places.  If you need to reach something above you, use a strong step stool that has a grab bar.  Keep electrical cords out of the way.  Do not use floor polish or wax that makes floors slippery. If you must use wax, use non-skid floor wax.  Do not have throw rugs and other things on the floor that can make you trip.  What can I do with my stairs?  Do not leave any items on the stairs.  Make sure that there are handrails on both sides of the stairs and use them. Fix handrails that are broken or loose. Make sure that handrails are as long as the stairways.  Check any carpeting to make sure that it is firmly attached to the stairs. Fix any carpet that is loose or worn.  Avoid having throw rugs at the top or bottom of the stairs. If you do have throw rugs, attach them to the floor with carpet tape.  Make sure that you have a light switch at the top of the stairs and the bottom of the stairs. If you do not have them, ask someone to add them for you. What else can I do to help prevent falls?  Wear shoes that:  Do not have high heels.  Have rubber bottoms.  Are comfortable and fit you well.  Are closed at the toe. Do not wear sandals.  If you use a stepladder:  Make sure that it is fully opened. Do not climb  a closed stepladder.  Make sure that both sides of the stepladder are locked into place.  Ask someone to hold it for you, if possible.  Clearly mark and make sure that you can see:  Any grab bars or handrails.  First and last steps.  Where the edge of each step is.  Use tools that help you move around (mobility aids) if they are needed. These include:  Canes.  Walkers.  Scooters.  Crutches.  Turn on the lights when you go into a dark area. Replace any light bulbs as soon as they burn out.  Set up your furniture so you have a clear path. Avoid moving your furniture around.  If any of your floors are uneven, fix them.  If there are any pets around you, be aware of where they are.  Review your medicines with your doctor. Some medicines can make you feel dizzy. This can increase your chance of falling. Ask your doctor what other things that you can do to help prevent falls. This information is not intended to replace advice given to you by your health care provider. Make sure you discuss any questions you have with your health care provider. Document Released: 01/23/2009 Document Revised: 09/04/2015 Document Reviewed: 05/03/2014 Elsevier Interactive Patient Education  2017 [employer].

## 2018-11-07 ENCOUNTER — Other Ambulatory Visit (INDEPENDENT_AMBULATORY_CARE_PROVIDER_SITE_OTHER): Payer: Medicare HMO

## 2018-11-07 ENCOUNTER — Encounter: Payer: Self-pay | Admitting: Family Medicine

## 2018-11-07 ENCOUNTER — Ambulatory Visit (INDEPENDENT_AMBULATORY_CARE_PROVIDER_SITE_OTHER): Payer: Medicare HMO | Admitting: Family Medicine

## 2018-11-07 ENCOUNTER — Other Ambulatory Visit: Payer: Self-pay

## 2018-11-07 VITALS — BP 126/62 | HR 80 | Temp 97.2°F | Ht 64.25 in | Wt 109.0 lb

## 2018-11-07 DIAGNOSIS — D500 Iron deficiency anemia secondary to blood loss (chronic): Secondary | ICD-10-CM

## 2018-11-07 DIAGNOSIS — E118 Type 2 diabetes mellitus with unspecified complications: Secondary | ICD-10-CM

## 2018-11-07 DIAGNOSIS — F17200 Nicotine dependence, unspecified, uncomplicated: Secondary | ICD-10-CM | POA: Diagnosis not present

## 2018-11-07 DIAGNOSIS — F1011 Alcohol abuse, in remission: Secondary | ICD-10-CM | POA: Diagnosis not present

## 2018-11-07 DIAGNOSIS — Z0000 Encounter for general adult medical examination without abnormal findings: Secondary | ICD-10-CM

## 2018-11-07 DIAGNOSIS — K7031 Alcoholic cirrhosis of liver with ascites: Secondary | ICD-10-CM | POA: Diagnosis not present

## 2018-11-07 DIAGNOSIS — I8500 Esophageal varices without bleeding: Secondary | ICD-10-CM | POA: Diagnosis not present

## 2018-11-07 DIAGNOSIS — R5382 Chronic fatigue, unspecified: Secondary | ICD-10-CM

## 2018-11-07 DIAGNOSIS — E559 Vitamin D deficiency, unspecified: Secondary | ICD-10-CM | POA: Diagnosis not present

## 2018-11-07 DIAGNOSIS — R7989 Other specified abnormal findings of blood chemistry: Secondary | ICD-10-CM | POA: Insufficient documentation

## 2018-11-07 DIAGNOSIS — E7800 Pure hypercholesterolemia, unspecified: Secondary | ICD-10-CM

## 2018-11-07 DIAGNOSIS — E1142 Type 2 diabetes mellitus with diabetic polyneuropathy: Secondary | ICD-10-CM | POA: Diagnosis not present

## 2018-11-07 DIAGNOSIS — D649 Anemia, unspecified: Secondary | ICD-10-CM

## 2018-11-07 DIAGNOSIS — E1169 Type 2 diabetes mellitus with other specified complication: Secondary | ICD-10-CM | POA: Diagnosis not present

## 2018-11-07 DIAGNOSIS — E871 Hypo-osmolality and hyponatremia: Secondary | ICD-10-CM | POA: Insufficient documentation

## 2018-11-07 DIAGNOSIS — G5793 Unspecified mononeuropathy of bilateral lower limbs: Secondary | ICD-10-CM

## 2018-11-07 DIAGNOSIS — R2689 Other abnormalities of gait and mobility: Secondary | ICD-10-CM

## 2018-11-07 LAB — CBC
HCT: 27.1 % — ABNORMAL LOW (ref 36.0–46.0)
Hemoglobin: 8.5 g/dL — ABNORMAL LOW (ref 12.0–15.0)
MCHC: 31.3 g/dL (ref 30.0–36.0)
MCV: 78.8 fl (ref 78.0–100.0)
Platelets: 143 10*3/uL — ABNORMAL LOW (ref 150.0–400.0)
RBC: 3.44 Mil/uL — ABNORMAL LOW (ref 3.87–5.11)
RDW: 17.5 % — ABNORMAL HIGH (ref 11.5–15.5)
WBC: 10.3 10*3/uL (ref 4.0–10.5)

## 2018-11-07 NOTE — Assessment & Plan Note (Signed)
Vitamin D level is therapeutic with current supplementation Disc importance of this to bone and overall health Level of 71

## 2018-11-07 NOTE — Assessment & Plan Note (Signed)
Severe anemia due to cirrhosis related upper GI blood losses Gradual improvement with iron  Also clinical improvement Will continue bid niferex and tx for varices

## 2018-11-07 NOTE — Assessment & Plan Note (Signed)
Suspect this was cause of recent severe iron def anemia in setting of cirrhosis  Continues GI f/u

## 2018-11-07 NOTE — Assessment & Plan Note (Signed)
Improved after PT

## 2018-11-07 NOTE — Assessment & Plan Note (Signed)
Lab Results  Component Value Date   CREATININE 1.30 (H) 11/06/2018    Lab Results  Component Value Date   K 5.3 (H) 11/06/2018   Na is low Suspect side eff of spironolactone/diuretics  Will reach out to GI to see if we can cut doses since edema is not a problem

## 2018-11-07 NOTE — Assessment & Plan Note (Signed)
Recent EGD/ gastropathy , severe iron def anemia and deconditioning Edema is very well controlled and now cr and K are up / na is low  Plan to reach out to her GI specialist to see if we can cut back on spironolactone

## 2018-11-07 NOTE — Progress Notes (Signed)
Subjective:    Patient ID: Christine Barton, female    DOB: Apr 16, 1950, 68 y.o.   MRN: [id number]  HPI Here for health maintenance exam and to review chronic medical problems    Had amw on 7/28 Noted eye exam scheduled in august as is mammogram Had "blood in her eye"  Self breast exam -no lumps  No gyn problems   Noted 8 falls this year when she was very week  Now much improved after PT and stabilization of her medical problems  Has stopped falling    Wt Readings from Last 3 Encounters:  11/07/18 109 lb (49.4 kg)  11/06/18 112 lb (50.8 kg)  10/27/18 109 lb (49.4 kg)  had EGD and did not eat for a while  Her appetite is slowly coming back  18.56 kg/m   Current smoker - about 1ppd  Right now-she is still contemplating quitting but not ready yet  Breathing is the same  As a chronic smokers cough   She gets a flu shot every fall   Colonoscopy 8/18 with 3 y recall Had EGD recently for gastropathy/ varices from prev etoh abuse and cirrhosis  Now on nadolol  Also protonix   Recent severe anemia from upper GI blood loss from above Hb was 7.7  Now 8.1  Lab Results  Component Value Date   FERRITIN 8.2 (L) 10/23/2018  taking niferex 150 mg bid   BP Readings from Last 3 Encounters:  11/07/18 126/62  10/30/18 (!) 132/55  10/27/18 (!) 108/50   Pulse Readings from Last 3 Encounters:  11/07/18 80  10/30/18 87  10/27/18 96   Had fibroscan yesterday    [facility]   dexa 7/18  bmd was in the normal range D level is good at 71.8  Hyperlipidemia Lab Results  Component Value Date   CHOL 128 11/06/2018   CHOL 121 10/23/2018   CHOL 133 10/31/2017   Lab Results  Component Value Date   HDL 32.60 (L) 11/06/2018   HDL 32.30 (L) 10/23/2018   HDL 25.30 (L) 10/31/2017   Lab Results  Component Value Date   LDLCALC 70 11/06/2018   LDLCALC 69 10/23/2018   LDLCALC 124 (H) 10/19/2016   Lab Results  Component Value Date   TRIG 125.0 11/06/2018   TRIG 99.0 10/23/2018   TRIG 261.0 (H) 10/31/2017   Lab Results  Component Value Date   CHOLHDL 4 11/06/2018   CHOLHDL 4 10/23/2018   CHOLHDL 5 10/31/2017   Lab Results  Component Value Date   LDLDIRECT 83.0 10/31/2017   LDLDIRECT 145.1 10/10/2012   LDLDIRECT 151.4 07/21/2011  HDL is low LDL is low Not ready to exercise yet   DM2  Lab Results  Component Value Date   HGBA1C 7.0 (H) 11/06/2018  needs more calories but not sugar calories  Later we may need diabetic medicine    Recent coronavirus test was negative    Latest cmet    Chemistry      Component Value Date/Time   NA 127 (L) 11/06/2018 0839   K 5.3 (H) 11/06/2018 0839   CL 96 11/06/2018 0839   CO2 21 11/06/2018 0839   BUN 25 (H) 11/06/2018 0839   CREATININE 1.30 (H) 11/06/2018 0839      Component Value Date/Time   CALCIUM 10.1 11/06/2018 0839   ALKPHOS 101 11/06/2018 0839   AST 24 11/06/2018 0839   ALT 10 11/06/2018 0839   BILITOT 0.7 11/06/2018 9292  Sodium is low- takes aldactone 50 mg bid for edema from her cirrhosis  K is high Cr is up  Has not been swelling at all  Takes lasix 40 daily  Spironolactone 50 bid   Patient Active Problem List   Diagnosis Date Noted  . Hyponatremia 11/07/2018  . Elevated serum creatinine 11/07/2018  . Esophageal varices without bleeding ([place]) 10/27/2018  . Anemia 10/23/2018  . Fatigue 10/23/2018  . Poor balance 05/30/2018  . Falls 05/30/2018  . Generalized weakness 05/30/2018  . Cirrhosis of liver ([place]) 04/24/2018  . Ascites 04/24/2018  . Gallstones 04/24/2018  . Heme positive stool 04/24/2018  . Screening mammogram, encounter for 11/07/2017  . Smoker 11/07/2017  . Screening examination for STD (sexually transmitted disease) 11/11/2016  . Welcome to Medicare preventive visit 10/26/2016  . Estrogen deficiency 10/26/2016  . Colon cancer screening 11/01/2014  . Elevated liver enzymes 11/01/2014  . Encounter for routine gynecological examination  09/12/2013  . Rapid heart beat 10/10/2012  . Heartburn 08/20/2011  . Routine general medical examination at a health care facility 07/21/2011  . Vitamin D deficiency 08/04/2009  . POSTMENOPAUSAL STATUS 08/04/2009  . Controlled diabetes mellitus type 2 with complications ([place]) 19/37/9024  . Hyperlipidemia 06/14/2008  . [place] DISEASE, CERVICAL 03/30/2007  . History of alcohol abuse 12/06/2006  . Neuropathy of both feet 12/06/2006  . DIVERTICULOSIS, COLON 12/06/2006  . Fatty liver 12/06/2006  . BREAST CANCER, HX OF 12/06/2006   Past Medical History:  Diagnosis Date  . Alcohol abuse, unspecified   . Breast cancer ([place]) 1998   Right  . Cataract    left eye  . Cervical spondylosis 2006   MRI  . Degeneration of cervical intervertebral disc 2006   MRI  . Diverticulosis of colon (without mention of hemorrhage)   . Hyperpotassemia   . Microscopic hematuria   . Mononeuritis of unspecified site   . Nonspecific abnormal results of liver function study   . Other abnormal glucose   . Other and unspecified hyperlipidemia    no per pt  . Other chronic nonalcoholic liver disease   . Personal history of chemotherapy   . Personal history of malignant neoplasm of breast   . Personal history of radiation therapy   . Pneumothorax, acute    right, spontaneous  . Tobacco use disorder   . Unspecified vitamin D deficiency    Past Surgical History:  Procedure Laterality Date  . BREAST BIOPSY  9/03   Right  . BREAST LUMPECTOMY Right 1998  . BREAST LUMPECTOMY  1998  . CHEST TUBE INSERTION  11/02/2014  . COLONOSCOPY    . ESOPHAGOGASTRODUODENOSCOPY (EGD) WITH PROPOFOL N/A 10/30/2018   Procedure: ESOPHAGOGASTRODUODENOSCOPY (EGD) WITH PROPOFOL;  Surgeon: Mauri Pole, MD;  Location: [facility];  Service: Endoscopy;  Laterality: N/A;  . EYE SURGERY  02/2017   cataract extraction with lens implant  . TUBAL LIGATION     Social History   Tobacco Use  . Smoking status: Current Every Day  Smoker    Packs/day: 1.00    Years: 53.00    Pack years: 53.00    Types: Cigarettes  . Smokeless tobacco: Never Used  . Tobacco comment: tobacco info given 04/27/2018  Substance Use Topics  . Alcohol use: Not Currently    Comment: occasional use  . Drug use: No   Family History  Problem Relation Age of Onset  . Heart failure Father   . Colon cancer Maternal Uncle   . Stroke Mother   .  Esophageal cancer Neg Hx   . Rectal cancer Neg Hx   . Stomach cancer Neg Hx    Allergies  Allergen Reactions  . Oxycodone Nausea And Vomiting  . Glipizide Other (See Comments)    Stomach pain  . Metformin And Related Other (See Comments)    Stomach pain   Current Outpatient Medications on File Prior to Visit  Medication Sig Dispense Refill  . acetaminophen (TYLENOL) 500 MG tablet Take 500 mg by mouth 2 (two) times a day. Uses only as needed    . cyclobenzaprine (FLEXERIL) 10 MG tablet TAKE 0.5 TABLETS (5 MG TOTAL) BY MOUTH AT BEDTIME AS NEEDED FOR MUSCLE SPASMS. 15 tablet 1  . doxylamine, Sleep, (UNISOM) 25 MG tablet Take 25 mg by mouth at bedtime as needed for sleep.     . furosemide (LASIX) 20 MG tablet Take 40 mg by mouth daily.    . iron polysaccharides (NIFEREX) 150 MG capsule Take 1 capsule (150 mg total) by mouth 2 (two) times daily. 60 capsule 3  . lactulose (CHRONULAC) 10 GM/15ML solution Take 15 mLs (10 g total) by mouth 3 (three) times daily. (Patient taking differently: Take 20 g by mouth See admin instructions. Take 20 g by mouth twice daily, may take a third 20 g dose as needed for constipation) 236 mL 11  . mupirocin ointment (BACTROBAN) 2 % APPLY A SMALL AMOUNT TO AFFECTED AREA ON INSIDE OF NOSE 2 TIMES DAILY AS DIRECTED (Patient taking differently: Place 1 application into the nose 2 (two) times daily as needed (sores). ) 22 g 1  . nadolol (CORGARD) 20 MG tablet Take 1 tablet (20 mg total) by mouth daily. 30 tablet 11  . pantoprazole (PROTONIX) 40 MG tablet Take 1 tablet (40 mg  total) by mouth daily. 90 tablet 3  . Polyethyl Glycol-Propyl Glycol (SYSTANE OP) Place 1 drop into the left eye daily as needed (dryness).    Marland Kitchen spironolactone (ALDACTONE) 50 MG tablet Take 1 tablet (50 mg total) by mouth 2 (two) times daily. 180 tablet 3   No current facility-administered medications on file prior to visit.     Review of Systems  Constitutional: Positive for fatigue. Negative for activity change, appetite change, fever and unexpected weight change.  HENT: Negative for congestion, ear pain, rhinorrhea, sinus pressure and sore throat.   Eyes: Negative for pain, redness and visual disturbance.  Respiratory: Negative for cough, shortness of breath and wheezing.   Cardiovascular: Negative for chest pain and palpitations.  Gastrointestinal: Negative for abdominal pain, blood in stool, constipation and diarrhea.  Endocrine: Negative for polydipsia and polyuria.  Genitourinary: Negative for dysuria, frequency and urgency.  Musculoskeletal: Negative for arthralgias, back pain and myalgias.  Skin: Negative for pallor and rash.  Allergic/Immunologic: Negative for environmental allergies.  Neurological: Positive for tremors and weakness. Negative for dizziness, syncope and headaches.  Hematological: Negative for adenopathy. Does not bruise/bleed easily.  Psychiatric/Behavioral: Negative for decreased concentration and dysphoric mood. The patient is not nervous/anxious.        Objective:   Physical Exam Constitutional:      General: She is not in acute distress.    Appearance: Normal appearance. She is well-developed. She is not ill-appearing or diaphoretic.     Comments: Underweight and frail appearing   HENT:     Head: Normocephalic and atraumatic.     Right Ear: Tympanic membrane, ear canal and external ear normal. There is no impacted cerumen.     Left Ear: Tympanic  membrane, ear canal and external ear normal. There is no impacted cerumen.     Nose: Nose normal.      Mouth/Throat:     Mouth: Mucous membranes are moist.     Pharynx: Oropharynx is clear. No posterior oropharyngeal erythema.  Eyes:     General: No scleral icterus.    Conjunctiva/sclera: Conjunctivae normal.     Pupils: Pupils are equal, round, and reactive to light.  Neck:     Musculoskeletal: Normal range of motion and neck supple. No neck rigidity or muscular tenderness.     Thyroid: No thyromegaly.     Vascular: No carotid bruit or JVD.  Cardiovascular:     Rate and Rhythm: Normal rate and regular rhythm.     Pulses: Normal pulses.     Heart sounds: Normal heart sounds. No gallop.   Pulmonary:     Effort: Pulmonary effort is normal. No respiratory distress.     Breath sounds: Normal breath sounds. No wheezing.  Chest:     Chest wall: No tenderness.  Abdominal:     General: Bowel sounds are normal. There is no distension or abdominal bruit.     Palpations: Abdomen is soft. There is no mass.     Tenderness: There is no abdominal tenderness.     Hernia: No hernia is present.     Comments: No detectable ascites on exam No tenderness  Genitourinary:    Comments: Breast exam: No mass, nodules, thickening, tenderness, bulging, retraction, inflamation, nipple discharge or skin changes noted.  No axillary or clavicular LA.     Musculoskeletal: Normal range of motion.        General: No tenderness.     Right lower leg: No edema.     Left lower leg: No edema.  Lymphadenopathy:     Cervical: No cervical adenopathy.  Skin:    General: Skin is warm and dry.     Coloration: Skin is not pale.     Findings: No erythema or rash.     Comments: Solar lentigines diffusely   Neurological:     Mental Status: She is alert.     Cranial Nerves: No cranial nerve deficit.     Motor: No abnormal muscle tone.     Coordination: Coordination normal.     Gait: Gait normal.     Deep Tendon Reflexes: Reflexes are normal and symmetric. Reflexes normal.     Comments: Mild hand tremor   Psychiatric:         Mood and Affect: Mood is anxious.        Speech: Speech normal.     Comments: Mildly anxious  Cognition seems mildly slowed with memory issues -no worse than previous            Assessment & Plan:   Problem List Items Addressed This Visit      Cardiovascular and Mediastinum   Esophageal varices without bleeding ([place])    Suspect this was cause of recent severe iron def anemia in setting of cirrhosis  Continues GI f/u        Digestive   Cirrhosis of liver ([place])    Recent EGD/ gastropathy , severe iron def anemia and deconditioning Edema is very well controlled and now cr and K are up / na is low  Plan to reach out to her GI specialist to see if we can cut back on spironolactone        Endocrine   Controlled diabetes mellitus type 2  with complications [facility])    Lab Results  Component Value Date   HGBA1C 7.0 (H) 11/06/2018   This is up now that she is eating  Plans to meet with nutritionist soon  F/u 3 mo  Will likely have to add metformin  Disc cutting sugar w/o cutting calories         Nervous and Auditory   Neuropathy of both feet    Multifactorial Will need better glucose control         Other   Vitamin D deficiency    Vitamin D level is therapeutic with current supplementation Disc importance of this to bone and overall health Level of 71       Hyperlipidemia    Disc goals for lipids and reasons to control them Rev last labs with pt Rev low sat fat diet in detail  HDL is low= will enc exercise when more clinically stable      History of alcohol abuse    Still alcohol free       Routine general medical examination at a health care facility - Primary    Reviewed health habits including diet and exercise and skin cancer prevention Reviewed appropriate screening tests for age  Also reviewed health mt list, fam hx and immunization status , as well as social and family history   See HPI Labs rev  Will reach out to GI re: cutting diuretic  doses  Enc more calories w/o more sugar- will be seeing nutritionist  Mammogram planned next months  Will get a flu shot in the fall  Colonoscopy due 11/2019 Last dexa in nl range  Strongly enc smoking cessation-pt states she is not ready        Smoker    Disc in detail risks of smoking and possible outcomes including copd, vascular/ heart disease, cancer , respiratory and sinus infections  Pt voices understanding She is not ready to quit       Ascites    Much improved      Poor balance    Improved after PT      Anemia    Severe anemia due to cirrhosis related upper GI blood losses Gradual improvement with iron  Also clinical improvement Will continue bid niferex and tx for varices       Fatigue    Improved with iron tx       Hyponatremia    Suspect due to diuretics-will reach out to GI to req cutting doses      Elevated serum creatinine    Lab Results  Component Value Date   CREATININE 1.30 (H) 11/06/2018    Lab Results  Component Value Date   K 5.3 (H) 11/06/2018   Na is low Suspect side eff of spironolactone/diuretics  Will reach out to GI to see if we can cut doses since edema is not a problem

## 2018-11-07 NOTE — Assessment & Plan Note (Signed)
Multifactorial Will need better glucose control

## 2018-11-07 NOTE — Assessment & Plan Note (Signed)
Much improved

## 2018-11-07 NOTE — Assessment & Plan Note (Signed)
Improved with iron tx

## 2018-11-07 NOTE — Assessment & Plan Note (Signed)
Disc goals for lipids and reasons to control them Rev last labs with pt Rev low sat fat diet in detail  HDL is low= will enc exercise when more clinically stable

## 2018-11-07 NOTE — Assessment & Plan Note (Signed)
Disc in detail risks of smoking and possible outcomes including copd, vascular/ heart disease, cancer , respiratory and sinus infections  Pt voices understanding She is not ready to quit

## 2018-11-07 NOTE — Assessment & Plan Note (Addendum)
Reviewed health habits including diet and exercise and skin cancer prevention Reviewed appropriate screening tests for age  Also reviewed health mt list, fam hx and immunization status , as well as social and family history   See HPI Labs rev  Will reach out to GI re: cutting diuretic doses  Enc more calories w/o more sugar- will be seeing nutritionist  Mammogram planned next months  Will get a flu shot in the fall  Colonoscopy due 11/2019 Last dexa in nl range  Strongly enc smoking cessation-pt states she is not ready

## 2018-11-07 NOTE — Patient Instructions (Addendum)
Follow up in 3 months I will contact GI regarding the spironolactone- I would like to cut the dose for kidney health   See the nutritionist as planned  You are back in the diabetic range  We need more calories but not sugar   Take a copy of your labs to the nutritionist

## 2018-11-07 NOTE — Assessment & Plan Note (Signed)
Lab Results  Component Value Date   HGBA1C 7.0 (H) 11/06/2018   This is up now that she is eating  Plans to meet with nutritionist soon  F/u 3 mo  Will likely have to add metformin  Disc cutting sugar w/o cutting calories

## 2018-11-07 NOTE — Assessment & Plan Note (Signed)
Still alcohol free

## 2018-11-07 NOTE — Assessment & Plan Note (Signed)
Suspect due to diuretics-will reach out to GI to req cutting doses

## 2018-11-08 ENCOUNTER — Telehealth: Payer: Self-pay

## 2018-11-08 DIAGNOSIS — K7460 Unspecified cirrhosis of liver: Secondary | ICD-10-CM

## 2018-11-08 NOTE — Telephone Encounter (Signed)
-----   Message from Mauri Pole, MD sent at 11/07/2018  5:09 PM EDT ----- Yes agree with decreasing the spironolactone dose.  Given her creatinine is going up, I will advise patient to hold all diuretics for a week.  Recheck her BMP next week and can plan to restart lower dose spironolactone 67m daily if creatinine is improving. Beth, please bring her in for an office visit within next couple weeks.   Thank you VMargarette Asal----- Message ----- From: TAbner Greenspan MD Sent: 11/07/2018   5:05 PM EDT To: KMauri Pole MD  Ms Vantine's cr iis up and her sodium is down to 127 Potassium is up as well  She has no swelling at all.  Do you think it would be ok to cut her spironolactone dose down a bit? Let me know what you think  Thanks for taking care of her ! --MRoque LiasT

## 2018-11-08 NOTE — Telephone Encounter (Signed)
Spoke with pt and let her know to hold her diuretics for now and to come for repeat labs in 1 week. Pt verbalized understanding, order in epic.

## 2018-11-13 ENCOUNTER — Telehealth: Payer: Self-pay

## 2018-11-13 NOTE — Telephone Encounter (Signed)
Pt left v/m that Dr Elease Hashimoto had approved a multi vitamin for pt and wanted to speak with Dr Glori Bickers about it. I spoke with pt and she cannot take horse pill multivitamins; pt has talked with Dr Glori Bickers at length about it before. Pt said the only multivitamin that is small enough she can take is an OTC multivitamin by [employer]. Pt does not know any other name to give me.Pt last annual visit was 11/07/18. Pt request cb after Dr Glori Bickers reviews.

## 2018-11-13 NOTE — Telephone Encounter (Signed)
Pt notified of Dr. Marliss Coots comments.

## 2018-11-13 NOTE — Telephone Encounter (Signed)
As long as it was cleared by her liver specialist (and not a specific type or brand)- I would suspect any multivitamin is ok

## 2018-11-14 ENCOUNTER — Other Ambulatory Visit (INDEPENDENT_AMBULATORY_CARE_PROVIDER_SITE_OTHER): Payer: Medicare HMO

## 2018-11-14 DIAGNOSIS — K7460 Unspecified cirrhosis of liver: Secondary | ICD-10-CM | POA: Diagnosis not present

## 2018-11-14 LAB — BASIC METABOLIC PANEL
BUN: 18 mg/dL (ref 6–23)
CO2: 23 mEq/L (ref 19–32)
Calcium: 9.8 mg/dL (ref 8.4–10.5)
Chloride: 105 mEq/L (ref 96–112)
Creatinine, Ser: 0.85 mg/dL (ref 0.40–1.20)
GFR: 66.53 mL/min (ref >60.00)
Glucose, Bld: 168 mg/dL — ABNORMAL HIGH (ref 70–99)
Potassium: 4.8 mEq/L (ref 3.5–5.1)
Sodium: 134 mEq/L — ABNORMAL LOW (ref 135–145)

## 2018-11-15 ENCOUNTER — Ambulatory Visit (INDEPENDENT_AMBULATORY_CARE_PROVIDER_SITE_OTHER): Payer: Medicare HMO | Admitting: Family Medicine

## 2018-11-15 ENCOUNTER — Encounter: Payer: Self-pay | Admitting: Family Medicine

## 2018-11-15 ENCOUNTER — Other Ambulatory Visit: Payer: Self-pay

## 2018-11-15 VITALS — BP 120/62 | HR 82 | Temp 97.9°F | Resp 24 | Ht 64.25 in | Wt 110.4 lb

## 2018-11-15 DIAGNOSIS — D500 Iron deficiency anemia secondary to blood loss (chronic): Secondary | ICD-10-CM

## 2018-11-15 DIAGNOSIS — I872 Venous insufficiency (chronic) (peripheral): Secondary | ICD-10-CM | POA: Diagnosis not present

## 2018-11-15 DIAGNOSIS — E871 Hypo-osmolality and hyponatremia: Secondary | ICD-10-CM

## 2018-11-15 DIAGNOSIS — R7989 Other specified abnormal findings of blood chemistry: Secondary | ICD-10-CM | POA: Diagnosis not present

## 2018-11-15 DIAGNOSIS — K7031 Alcoholic cirrhosis of liver with ascites: Secondary | ICD-10-CM

## 2018-11-15 DIAGNOSIS — E118 Type 2 diabetes mellitus with unspecified complications: Secondary | ICD-10-CM | POA: Diagnosis not present

## 2018-11-15 DIAGNOSIS — R5382 Chronic fatigue, unspecified: Secondary | ICD-10-CM

## 2018-11-15 DIAGNOSIS — F17200 Nicotine dependence, unspecified, uncomplicated: Secondary | ICD-10-CM

## 2018-11-15 NOTE — Assessment & Plan Note (Signed)
So far not re occurring off diuretics  Will continue to follow

## 2018-11-15 NOTE — Assessment & Plan Note (Signed)
Improved off diuretics Na of 134  No swelling  Will continue to follow

## 2018-11-15 NOTE — Assessment & Plan Note (Signed)
The lumps pt sees/feels on her legs intermittently are likely varicose veins Mild today, compressible , non tender w/o signs of inflammation Disc imp of leg elevation and also option of support hose if desired  Will watch for pain or worsening symptoms

## 2018-11-15 NOTE — Assessment & Plan Note (Signed)
Pt has constant smoker's cough  Baseline sob-no changes She is not motivated to quit yet despite cirrhosis and DM and other medical problems  Disc in detail risks of smoking and possible outcomes including copd, vascular/ heart disease, cancer , respiratory and sinus infections  Pt voices understanding

## 2018-11-15 NOTE — Assessment & Plan Note (Signed)
Some mild improvement with tx of anemia

## 2018-11-15 NOTE — Patient Instructions (Signed)
When you meet next with your nutritionist please mention your diabetes  Watch your diet for sugar/carbs  Try to get most of your carbohydrates from produce (with the exception of white potatoes)  Eat less bread/pasta/rice/snack foods/cereals/sweets and other items from the middle of the grocery store (processed carbs)    I think you have some varicose veins in your legs They tend to go up or down depending on position (try to elevate legs when you sit)  Support stockings may help also   Labs look better  For now stay off the aldactone unless GI recommends otherwise   If you develop swelling- let one of [place] know

## 2018-11-15 NOTE — Assessment & Plan Note (Signed)
From UGI bleed related to cirrhosis /gastropathy/varices Continues niferex 150 mg bid (along with a mvi)  Lab Results  Component Value Date   HCT 27.1 (L) 11/07/2018    Slow improvement  For GI f/u soon

## 2018-11-15 NOTE — Progress Notes (Signed)
Subjective:    Patient ID: Christine Barton, female    DOB: 05-07-1950, 68 y.o.   MRN: [id number]  HPI  Here for fatigue and also knots on legs   They went away  Had one on her R leg for years- got bigger and then gone  Not red or painful or tender  Walking ok  No longer weak  ? If worse after standing Wondered about varicose veins or phlebitis   Recent anemia from UGI bleed (esoph varicies in setting of cirrhosis)  Lab Results  Component Value Date   WBC 10.3 11/07/2018   HGB 8.5 Repeated and verified X2. (L) 11/07/2018   HCT 27.1 (L) 11/07/2018   MCV 78.8 11/07/2018   PLT 143.0 (L) 11/07/2018  gradually improving from 8.1 hb 2 wk ago Taking iron  niferex bid 150 mg  No abdominal pain   She saw the nutritionist  Approved a mvi as well - will get it soon  She talked to a nutritionist on the phone  Watching her sodium  Failed to mention she was diabetic-strongly enc her to do so Lab Results  Component Value Date   HGBA1C 7.0 (H) 11/06/2018   Would like to see improvement with diet before starting medication  Disc low glycemic diet  F/u planned   Wt Readings from Last 3 Encounters:  11/15/18 110 lb 6 oz (50.1 kg)  11/07/18 109 lb (49.4 kg)  11/06/18 112 lb (50.8 kg)   18.80 kg/m   Smoking status   Last visit cr up and electolyes off (high K and low na)  Cardiology had her hold her diuretics Labs improved Lab Results  Component Value Date   CREATININE 0.85 11/14/2018   BUN 18 11/14/2018   NA 134 (L) 11/14/2018   K 4.8 11/14/2018   CL 105 11/14/2018   CO2 23 11/14/2018   GFR up to 66.5  No longer needs aldactone -no swelling   She has GI f/u on 8/19     Patient Active Problem List   Diagnosis Date Noted  . Venous insufficiency 11/15/2018  . Hyponatremia 11/07/2018  . Elevated serum creatinine 11/07/2018  . Esophageal varices without bleeding ([place]) 10/27/2018  . Anemia 10/23/2018  . Fatigue 10/23/2018  . Poor balance 05/30/2018  . Falls  05/30/2018  . Generalized weakness 05/30/2018  . Cirrhosis of liver ([place]) 04/24/2018  . Ascites 04/24/2018  . Gallstones 04/24/2018  . Heme positive stool 04/24/2018  . Screening mammogram, encounter for 11/07/2017  . Smoker 11/07/2017  . Screening examination for STD (sexually transmitted disease) 11/11/2016  . Welcome to Medicare preventive visit 10/26/2016  . Estrogen deficiency 10/26/2016  . Colon cancer screening 11/01/2014  . Elevated liver enzymes 11/01/2014  . Encounter for routine gynecological examination 09/12/2013  . Rapid heart beat 10/10/2012  . Heartburn 08/20/2011  . Routine general medical examination at a health care facility 07/21/2011  . Vitamin D deficiency 08/04/2009  . POSTMENOPAUSAL STATUS 08/04/2009  . Controlled diabetes mellitus type 2 with complications ([place]) 66/59/9357  . Hyperlipidemia 06/14/2008  . [place] DISEASE, CERVICAL 03/30/2007  . History of alcohol abuse 12/06/2006  . Neuropathy of both feet 12/06/2006  . DIVERTICULOSIS, COLON 12/06/2006  . Fatty liver 12/06/2006  . BREAST CANCER, HX OF 12/06/2006   Past Medical History:  Diagnosis Date  . Alcohol abuse, unspecified   . Breast cancer ([place]) 1998   Right  . Cataract    left eye  . Cervical spondylosis 2006   MRI  .  Degeneration of cervical intervertebral disc 2006   MRI  . Diverticulosis of colon (without mention of hemorrhage)   . Hyperpotassemia   . Microscopic hematuria   . Mononeuritis of unspecified site   . Nonspecific abnormal results of liver function study   . Other abnormal glucose   . Other and unspecified hyperlipidemia    no per pt  . Other chronic nonalcoholic liver disease   . Personal history of chemotherapy   . Personal history of malignant neoplasm of breast   . Personal history of radiation therapy   . Pneumothorax, acute    right, spontaneous  . Tobacco use disorder   . Unspecified vitamin D deficiency    Past Surgical History:  Procedure Laterality Date   . BREAST BIOPSY  9/03   Right  . BREAST LUMPECTOMY Right 1998  . BREAST LUMPECTOMY  1998  . CHEST TUBE INSERTION  11/02/2014  . COLONOSCOPY    . ESOPHAGOGASTRODUODENOSCOPY (EGD) WITH PROPOFOL N/A 10/30/2018   Procedure: ESOPHAGOGASTRODUODENOSCOPY (EGD) WITH PROPOFOL;  Surgeon: Mauri Pole, MD;  Location: [facility];  Service: Endoscopy;  Laterality: N/A;  . EYE SURGERY  02/2017   cataract extraction with lens implant  . TUBAL LIGATION     Social History   Tobacco Use  . Smoking status: Current Every Day Smoker    Packs/day: 1.00    Years: 53.00    Pack years: 53.00    Types: Cigarettes  . Smokeless tobacco: Never Used  . Tobacco comment: tobacco info given 04/27/2018  Substance Use Topics  . Alcohol use: Not Currently    Comment: occasional use  . Drug use: No   Family History  Problem Relation Age of Onset  . Heart failure Father   . Colon cancer Maternal Uncle   . Stroke Mother   . Esophageal cancer Neg Hx   . Rectal cancer Neg Hx   . Stomach cancer Neg Hx    Allergies  Allergen Reactions  . Oxycodone Nausea And Vomiting  . Glipizide Other (See Comments)    Stomach pain  . Metformin And Related Other (See Comments)    Stomach pain   Current Outpatient Medications on File Prior to Visit  Medication Sig Dispense Refill  . acetaminophen (TYLENOL) 500 MG tablet Take 500 mg by mouth 2 (two) times a day. Uses only as needed    . cyclobenzaprine (FLEXERIL) 10 MG tablet TAKE 0.5 TABLETS (5 MG TOTAL) BY MOUTH AT BEDTIME AS NEEDED FOR MUSCLE SPASMS. 15 tablet 1  . doxylamine, Sleep, (UNISOM) 25 MG tablet Take 25 mg by mouth at bedtime as needed for sleep.     . iron polysaccharides (NIFEREX) 150 MG capsule Take 1 capsule (150 mg total) by mouth 2 (two) times daily. 60 capsule 3  . lactulose (CHRONULAC) 10 GM/15ML solution Take 15 mLs (10 g total) by mouth 3 (three) times daily. (Patient taking differently: Take 20 g by mouth See admin instructions. Take 20 g by mouth  twice daily, may take a third 20 g dose as needed for constipation) 236 mL 11  . mupirocin ointment (BACTROBAN) 2 % APPLY A SMALL AMOUNT TO AFFECTED AREA ON INSIDE OF NOSE 2 TIMES DAILY AS DIRECTED (Patient taking differently: Place 1 application into the nose 2 (two) times daily as needed (sores). ) 22 g 1  . nadolol (CORGARD) 20 MG tablet Take 1 tablet (20 mg total) by mouth daily. 30 tablet 11  . pantoprazole (PROTONIX) 40 MG tablet Take 1 tablet (  40 mg total) by mouth daily. 90 tablet 3  . Polyethyl Glycol-Propyl Glycol (SYSTANE OP) Place 1 drop into the left eye daily as needed (dryness).    . furosemide (LASIX) 20 MG tablet Take 40 mg by mouth daily.    Marland Kitchen spironolactone (ALDACTONE) 50 MG tablet Take 1 tablet (50 mg total) by mouth 2 (two) times daily. (Patient not taking: Reported on 11/15/2018) 180 tablet 3   No current facility-administered medications on file prior to visit.     Review of Systems  Constitutional: Positive for fatigue. Negative for activity change, appetite change, fever and unexpected weight change.  HENT: Negative for congestion, ear pain, rhinorrhea, sinus pressure and sore throat.   Eyes: Negative for pain, redness and visual disturbance.  Respiratory: Negative for cough, shortness of breath and wheezing.   Cardiovascular: Negative for chest pain and palpitations.       "lumps" in legs- likely varicose veins Come and go No pain   Gastrointestinal: Negative for abdominal pain, blood in stool, constipation and diarrhea.  Endocrine: Negative for polydipsia and polyuria.  Genitourinary: Negative for dysuria, frequency and urgency.  Musculoskeletal: Negative for arthralgias, back pain and myalgias.  Skin: Negative for pallor and rash.  Allergic/Immunologic: Negative for environmental allergies.  Neurological: Negative for dizziness, syncope and headaches.  Hematological: Negative for adenopathy. Does not bruise/bleed easily.  Psychiatric/Behavioral: Negative for  decreased concentration and dysphoric mood. The patient is not nervous/anxious.        Objective:   Physical Exam Constitutional:      General: She is not in acute distress.    Appearance: Normal appearance. She is well-developed and normal weight. She is not ill-appearing or diaphoretic.     Comments: Frail appearing /chronically ill  HENT:     Head: Normocephalic and atraumatic.     Mouth/Throat:     Mouth: Mucous membranes are moist.  Eyes:     Conjunctiva/sclera: Conjunctivae normal.     Pupils: Pupils are equal, round, and reactive to light.  Neck:     Musculoskeletal: Normal range of motion and neck supple. No neck rigidity.     Thyroid: No thyromegaly.     Vascular: No carotid bruit or JVD.  Cardiovascular:     Rate and Rhythm: Normal rate and regular rhythm.     Pulses: Normal pulses.     Heart sounds: Normal heart sounds. No gallop.   Pulmonary:     Effort: Pulmonary effort is normal. No respiratory distress.     Breath sounds: Normal breath sounds. No stridor. No wheezing, rhonchi or rales.     Comments: Diffusely distant bs Wheeze on forced exp  Chronic smoker's cough noted Chest:     Chest wall: No tenderness.  Abdominal:     General: Bowel sounds are normal. There is no distension or abdominal bruit.     Palpations: Abdomen is soft. There is no mass.     Tenderness: There is no abdominal tenderness.     Hernia: No hernia is present.     Comments: No recurrence of ascites  Musculoskeletal:     Right lower leg: No edema.     Left lower leg: No edema.     Comments: Per pt "lumps" in her legs are now gone Areas she points to correlate with varicose veins  Compressible/non tender and no erythema     Lymphadenopathy:     Cervical: No cervical adenopathy.  Skin:    General: Skin is warm and dry.  Findings: No erythema, lesion or rash.  Neurological:     Mental Status: She is alert. Mental status is at baseline.     Cranial Nerves: No cranial nerve  deficit.     Gait: Gait normal.     Deep Tendon Reflexes: Reflexes are normal and symmetric.  Psychiatric:        Mood and Affect: Mood is anxious.     Comments: Mildly anxious   Poor concentration            Assessment & Plan:   Problem List Items Addressed This Visit      Cardiovascular and Mediastinum   Venous insufficiency - Primary    The lumps pt sees/feels on her legs intermittently are likely varicose veins Mild today, compressible , non tender w/o signs of inflammation Disc imp of leg elevation and also option of support hose if desired  Will watch for pain or worsening symptoms         Endocrine   Controlled diabetes mellitus type 2 with complications ([place])    Pt is now seeing a nutritionist at [facility] (ref by her specialist for cirrhosis )  Focusing on low sodium heart healthy diet  Pt neglected to mention that she has diabetes (I enc her to do so)  Lab Results  Component Value Date   HGBA1C 7.0 (H) 11/06/2018   Hope to see improvement with better diet  Again enc strongly to quit smoking  Also to schedule an eye exam  Will re check A1C at f/u and start medication if needed         Other   Smoker    Pt has constant smoker's cough  Baseline sob-no changes She is not motivated to quit yet despite cirrhosis and DM and other medical problems  Disc in detail risks of smoking and possible outcomes including copd, vascular/ heart disease, cancer , respiratory and sinus infections  Pt voices understanding       Ascites    So far not re occurring off diuretics  Will continue to follow      Anemia    From UGI bleed related to cirrhosis /gastropathy/varices Continues niferex 150 mg bid (along with a mvi)  Lab Results  Component Value Date   HCT 27.1 (L) 11/07/2018    Slow improvement  For GI f/u soon      Fatigue    Some mild improvement with tx of anemia       Hyponatremia    Improved off diuretics Na of 134  No swelling  Will continue to  follow      Elevated serum creatinine    Improved off of diuretics Lab Results  Component Value Date   CREATININE 0.85 11/14/2018   will continue to watch

## 2018-11-15 NOTE — Assessment & Plan Note (Signed)
Improved off of diuretics Lab Results  Component Value Date   CREATININE 0.85 11/14/2018   will continue to watch

## 2018-11-15 NOTE — Assessment & Plan Note (Signed)
Pt is now seeing a nutritionist at [facility] (ref by her specialist for cirrhosis )  Focusing on low sodium heart healthy diet  Pt neglected to mention that she has diabetes (I enc her to do so)  Lab Results  Component Value Date   HGBA1C 7.0 (H) 11/06/2018   Hope to see improvement with better diet  Again enc strongly to quit smoking  Also to schedule an eye exam  Will re check A1C at f/u and start medication if needed

## 2018-11-17 ENCOUNTER — Telehealth: Payer: Self-pay | Admitting: Gastroenterology

## 2018-11-17 NOTE — Telephone Encounter (Signed)
Sodium was 124 last week. It is 134.  She will await instructions from her provider. Continue her medications until she hears back from [place].

## 2018-11-20 ENCOUNTER — Telehealth: Payer: Self-pay | Admitting: *Deleted

## 2018-11-20 MED ORDER — ALBUTEROL SULFATE HFA 108 (90 BASE) MCG/ACT IN AERS: 2.0000 | INHALATION_SPRAY | RESPIRATORY_TRACT | 3 refills | Status: DC | PRN

## 2018-11-20 NOTE — Telephone Encounter (Signed)
Patient called stating that she is wanting to know if Dr. Glori Bickers will send her a script in for an inhaler to use at night. Patient stated that Dr. Glori Bickers is aware of the problems that she is having with difficulty breathing. Patient stated that she is a smoker and she knows that she needs to quit smoking. Patient stated that she has seen Dr. Glori Bickers a lot recently. Pharmacy [facility]

## 2018-11-20 NOTE — Telephone Encounter (Signed)
I sent albuterol mdi  Can use as needed up to every 4 hours

## 2018-11-21 NOTE — Telephone Encounter (Signed)
Left a message last night with answering service last night after hours asking if her medications will change after the test results from 11-17-2018.

## 2018-11-22 ENCOUNTER — Other Ambulatory Visit: Payer: Self-pay

## 2018-11-22 DIAGNOSIS — K7460 Unspecified cirrhosis of liver: Secondary | ICD-10-CM

## 2018-11-22 NOTE — Telephone Encounter (Signed)
Patient instructed. Please see the lab result note for details.

## 2018-11-23 ENCOUNTER — Ambulatory Visit: Payer: Medicare HMO | Admitting: Dietician

## 2018-11-24 ENCOUNTER — Telehealth: Payer: Self-pay

## 2018-11-24 MED ORDER — NYSTATIN 100000 UNIT/ML MT SUSP: 5.0000 mL | Freq: Three times a day (TID) | OROMUCOSAL | 0 refills | Status: DC

## 2018-11-24 NOTE — Telephone Encounter (Addendum)
Pt called in to report she has oral sores x 2-3 days and also noticed some spots on her tongue today... pt reports a burning feeling in her mouth and after she eats... has noticed change in taste... denies sore throat, cough, runny nose... pt has tried salt water gargle with some relief  Please advise if virtual is appropriate or does pt need to be seen in office?

## 2018-11-24 NOTE — Telephone Encounter (Signed)
Pt notified of Dr. Marliss Coots comments and verbalized understanding.

## 2018-11-24 NOTE — Telephone Encounter (Signed)
I sent nystatin suspension to her pharmacy for thrush If no improvement or worse please f/u

## 2018-11-29 ENCOUNTER — Encounter: Payer: Self-pay | Admitting: Physician Assistant

## 2018-11-29 ENCOUNTER — Other Ambulatory Visit (INDEPENDENT_AMBULATORY_CARE_PROVIDER_SITE_OTHER): Payer: Medicare HMO

## 2018-11-29 ENCOUNTER — Ambulatory Visit: Payer: Medicare HMO | Admitting: Physician Assistant

## 2018-11-29 VITALS — BP 106/48 | HR 80 | Temp 98.5°F | Ht 64.0 in | Wt 111.1 lb

## 2018-11-29 DIAGNOSIS — D649 Anemia, unspecified: Secondary | ICD-10-CM

## 2018-11-29 DIAGNOSIS — K766 Portal hypertension: Secondary | ICD-10-CM

## 2018-11-29 DIAGNOSIS — K3189 Other diseases of stomach and duodenum: Secondary | ICD-10-CM

## 2018-11-29 DIAGNOSIS — B1920 Unspecified viral hepatitis C without hepatic coma: Secondary | ICD-10-CM

## 2018-11-29 DIAGNOSIS — K7469 Other cirrhosis of liver: Secondary | ICD-10-CM

## 2018-11-29 LAB — COMPREHENSIVE METABOLIC PANEL
ALT: 9 U/L (ref 0–35)
AST: 22 U/L (ref 0–37)
Albumin: 4 g/dL (ref 3.5–5.2)
Alkaline Phosphatase: 83 U/L (ref 39–117)
BUN: 17 mg/dL (ref 6–23)
CO2: 24 mEq/L (ref 19–32)
Calcium: 9.7 mg/dL (ref 8.4–10.5)
Chloride: 106 mEq/L (ref 96–112)
Creatinine, Ser: 1 mg/dL (ref 0.40–1.20)
GFR: 55.14 mL/min — ABNORMAL LOW (ref >60.00)
Glucose, Bld: 181 mg/dL — ABNORMAL HIGH (ref 70–99)
Potassium: 3.8 mEq/L (ref 3.5–5.1)
Sodium: 139 mEq/L (ref 135–145)
Total Bilirubin: 0.7 mg/dL (ref 0.2–1.2)
Total Protein: 7.5 g/dL (ref 6.0–8.3)

## 2018-11-29 LAB — CBC WITH DIFFERENTIAL/PLATELET
Basophils Absolute: 0.1 10*3/uL (ref 0.0–0.1)
Basophils Relative: 0.9 % (ref 0.0–3.0)
Eosinophils Absolute: 0.2 10*3/uL (ref 0.0–0.7)
Eosinophils Relative: 2.5 % (ref 0.0–5.0)
HCT: 25.7 % — ABNORMAL LOW (ref 36.0–46.0)
Hemoglobin: 8.1 g/dL — ABNORMAL LOW (ref 12.0–15.0)
Lymphocytes Relative: 29.9 % (ref 12.0–46.0)
Lymphs Abs: 2.1 10*3/uL (ref 0.7–4.0)
MCHC: 31.3 g/dL (ref 30.0–36.0)
MCV: 79.7 fl (ref 78.0–100.0)
Monocytes Absolute: 0.7 10*3/uL (ref 0.1–1.0)
Monocytes Relative: 10 % (ref 3.0–12.0)
Neutro Abs: 4 10*3/uL (ref 1.4–7.7)
Neutrophils Relative %: 56.7 % (ref 43.0–77.0)
Platelets: 215 10*3/uL (ref 150.0–400.0)
RBC: 3.23 Mil/uL — ABNORMAL LOW (ref 3.87–5.11)
RDW: 21.2 % — ABNORMAL HIGH (ref 11.5–15.5)
WBC: 7 10*3/uL (ref 4.0–10.5)

## 2018-11-29 LAB — PROTIME-INR
INR: 1.3 ratio — ABNORMAL HIGH (ref 0.8–1.0)
Prothrombin Time: 15.6 s — ABNORMAL HIGH (ref 9.6–13.1)

## 2018-11-29 NOTE — Progress Notes (Signed)
Subjective:    Patient ID: Christine Barton, female    DOB: [birth year], 68 y.o.   MRN: [id number]  HPI [name] is a pleasant 68 year old white female, known to Dr. [name] who comes in today for follow-up with diagnosis of decompensated cirrhosis secondary to EtOH, and recent episode of melena and anemia. Patient had undergone upper endoscopy on 11/05/2018 She was noted to have grade 2 nonbleeding esophageal varices and moderate portal gastropathy which was felt to be the source of recent blood loss.  She is on Protonix daily and nadolol 20 mg p.o. daily was added to her regimen. She is also been on diuretics, with decrease in dosage over the past month. Patient says she is feeling better, she has been having dark greenish appearing stool since starting on iron but says her energy level is better.  She has been taking her medications as directed including lactulose and usually has 2-3 bowel movements per day. She states she has not been drinking any alcohol since Christmas 2019. Ultrasound was done January 2020 with finding of mobile gallstones distended gallbladder CBD 7 mm and mild nodularity of the liver consistent with cirrhosis as well as moderate ascites. Most recent labs done 11/07/2018 hemoglobin was 8.5 hematocrit of 27.1 platelets 143, creatinine 0.85.  Patient has also been seeing  Dr. Neena Rhymes [facility] for her cirrhosis.  She had recent FibroScan done, result not available in care everywhere.  This was done on 11/06/2018.  She is also scheduled for MRI of her liver in September 2020 .  She had office visit there via telehealth April 2020.  At that time M ELD was 14.  Review of Systems Pertinent positive and negative review of systems were noted in the above HPI section.  All other review of systems was otherwise negative.  Outpatient Encounter Medications as of 11/29/2018  Medication Sig  . acetaminophen (TYLENOL) 500 MG tablet Take 500 mg by mouth 2 (two) times a day. Uses  only as needed  . albuterol (VENTOLIN HFA) 108 (90 Base) MCG/ACT inhaler Inhale 2 puffs into the lungs every 4 (four) hours as needed for wheezing or shortness of breath.  . furosemide (LASIX) 20 MG tablet Take 20 mg by mouth daily.   . iron polysaccharides (NIFEREX) 150 MG capsule Take 1 capsule (150 mg total) by mouth 2 (two) times daily.  Marland Kitchen lactulose (CHRONULAC) 10 GM/15ML solution Take 15 mLs (10 g total) by mouth 3 (three) times daily. (Patient taking differently: Take 20 g by mouth See admin instructions. Take 20 g by mouth twice daily, may take a third 20 g dose as needed for constipation)  . mupirocin ointment (BACTROBAN) 2 % APPLY A SMALL AMOUNT TO AFFECTED AREA ON INSIDE OF NOSE 2 TIMES DAILY AS DIRECTED (Patient taking differently: Place 1 application into the nose 2 (two) times daily as needed (sores). )  . nadolol (CORGARD) 20 MG tablet Take 1 tablet (20 mg total) by mouth daily.  Marland Kitchen nystatin (MYCOSTATIN) 100000 UNIT/ML suspension Take 5 mLs (500,000 Units total) by mouth 3 (three) times daily. Swish and swallow  . pantoprazole (PROTONIX) 40 MG tablet Take 1 tablet (40 mg total) by mouth daily.  [name] Glycol-Propyl Glycol (SYSTANE OP) Place 1 drop into the left eye daily as needed (dryness).  Marland Kitchen spironolactone (ALDACTONE) 50 MG tablet Take 1 tablet (50 mg total) by mouth 2 (two) times daily. (Patient taking differently: Take 25 mg by mouth daily. )  . cyclobenzaprine (FLEXERIL) 10 MG tablet  TAKE 0.5 TABLETS (5 MG TOTAL) BY MOUTH AT BEDTIME AS NEEDED FOR MUSCLE SPASMS. (Patient not taking: Reported on 11/29/2018)  . doxylamine, Sleep, (UNISOM) 25 MG tablet Take 25 mg by mouth at bedtime as needed for sleep.    No facility-administered encounter medications on file as of 11/29/2018.    Allergies  Allergen Reactions  . Oxycodone Nausea And Vomiting  . Glipizide Other (See Comments)    Stomach pain  . Metformin And Related Other (See Comments)    Stomach pain   Patient Active  Problem List   Diagnosis Date Noted  . Venous insufficiency 11/15/2018  . Hyponatremia 11/07/2018  . Elevated serum creatinine 11/07/2018  . Esophageal varices without bleeding ([place]) 10/27/2018  . Anemia 10/23/2018  . Fatigue 10/23/2018  . Poor balance 05/30/2018  . Falls 05/30/2018  . Generalized weakness 05/30/2018  . Cirrhosis of liver ([place]) 04/24/2018  . Ascites 04/24/2018  . Gallstones 04/24/2018  . Heme positive stool 04/24/2018  . Screening mammogram, encounter for 11/07/2017  . Smoker 11/07/2017  . Screening examination for STD (sexually transmitted disease) 11/11/2016  . Welcome to Medicare preventive visit 10/26/2016  . Estrogen deficiency 10/26/2016  . Colon cancer screening 11/01/2014  . Elevated liver enzymes 11/01/2014  . Encounter for routine gynecological examination 09/12/2013  . Rapid heart beat 10/10/2012  . Heartburn 08/20/2011  . Routine general medical examination at a health care facility 07/21/2011  . Vitamin D deficiency 08/04/2009  . POSTMENOPAUSAL STATUS 08/04/2009  . Controlled diabetes mellitus type 2 with complications ([place]) 93/81/8299  . Hyperlipidemia 06/14/2008  . [place] DISEASE, CERVICAL 03/30/2007  . History of alcohol abuse 12/06/2006  . Neuropathy of both feet 12/06/2006  . DIVERTICULOSIS, COLON 12/06/2006  . Fatty liver 12/06/2006  . BREAST CANCER, HX OF 12/06/2006   Social History   Socioeconomic History  . Marital status: Single    Spouse name: Not on file  . Number of children: 1  . Years of education: Not on file  . Highest education level: Not on file  Occupational History  . Occupation: retired    Fish farm manager: [employer]  Social Needs  . Financial resource strain: Not hard at all  . Food insecurity    Worry: Never true    Inability: Never true  . Transportation needs    Medical: No    Non-medical: No  Tobacco Use  . Smoking status: Current Every Day Smoker    Packs/day: 1.00    Years: 53.00    Pack years: 53.00     Types: Cigarettes  . Smokeless tobacco: Never Used  . Tobacco comment: tobacco info given 04/27/2018  Substance and Sexual Activity  . Alcohol use: Not Currently    Comment: occasional use  . Drug use: No  . Sexual activity: Not Currently  Lifestyle  . Physical activity    Days per week: 0 days    Minutes per session: 0 min  . Stress: Only a little  Relationships  . Social Herbalist on phone: Not on file    Gets together: Not on file    Attends religious service: Not on file    Active member of club or organization: Not on file    Attends meetings of clubs or organizations: Not on file    Relationship status: Not on file  . Intimate partner violence    Fear of current or ex partner: No    Emotionally abused: No    Physically abused: No  Forced sexual activity: No  Other Topics Concern  . Not on file  Social History Narrative   Divorced      1 child      Works at [employer]. Clyne's family history includes Colon cancer in her maternal uncle; Heart failure in her father; Stroke in her mother.      Objective:    Vitals:   11/29/18 1411  BP: (!) 106/48  Pulse: 80  Temp: 98.5 F (36.9 C)    Physical Exam Well-developed well-nourished thin older white female in no acute distress.  Height, Weight 111, BMI 19.0  HEENT; nontraumatic normocephalic, EOMI, PE R LA, sclera anicteric. Oropharynx; not examined/wearing mask/COVID Neck; supple, no JVD Cardiovascular; regular rate and rhythm with S1-S2, no murmur rub or gallop Pulmonary; Clear bilaterally Abdomen; soft, nontender, nondistended, no palpable mass,, no appreciable ascites or hepatosplenomegaly, bowel sounds are active Rectal; not done Skin; benign exam, no jaundice rash or appreciable lesions Extremities; no clubbing cyanosis or edema skin warm and dry Neuro/Psych; alert and oriented x4, grossly nonfocal mood and affect appropriate.  Mentating well, no asterixis        Assessment & Plan:   #60 68 year old white female with decompensated cirrhosis secondary to EtOH, abstinent from alcohol since December 2019.  Recent EGD secondary to reported dark stool and 3 g drop in her hemoglobin.  EGD revealed nonbleeding grade 2 esophageal varices and moderate portal gastropathy felt to be source of blood loss.  She was started on nadolol. Patient has not had any further evidence of melena, and appears to be tolerating nadolol well. #2 Anemia/iron deficient-acute and chronic GI blood loss secondary to portal gastropathy.  Now on iron supplementation  #3 ascites and peripheral edema-resolved on diuretic therapy #4 mild coagulopathy 5.  Cholelithiasis 6.  Diverticulosis 7.  Adult onset diabetes mellitus 8.  History of breast cancer  Plan; CBC with differential, c-Met, INR and AFP. Continue nadolol 20 mg p.o. daily Continue Protonix 40 mg p.o. daily Continue lactulose 15 mL p.o. twice daily Continue Niferex 1 p.o. twice daily Continue 2 g sodium diet Continue complete abstinence from EtOH, patient commended today for efforts Diuretic dosing recently decreased.  She will continue Lasix 20 mg p.o. every morning and Spironolactone 25 mg p.o. every morning (currently taking one half of a 50 mg tablet) She will keep her follow-up at [facility] for MRI in September .  Plan office follow-up with Dr. [name] in 2 months, or sooner as needed  Amy Genia Harold PA-C 11/29/2018   Cc: Tower, [name], MD

## 2018-11-29 NOTE — Patient Instructions (Addendum)
If you are age 68 or older, your body mass index should be between 23-30. Your Body mass index is 19.07 kg/m. If this is out of the aforementioned range listed, please consider follow up with your Primary Care Provider.  If you are age 44 or younger, your body mass index should be between 19-25. Your Body mass index is 19.07 kg/m. If this is out of the aformentioned range listed, please consider follow up with your Primary Care Provider.   Your provider has requested that you go to the basement level for lab work before leaving today. Press "B" on the elevator. The lab is located at the first door on the left as you exit the elevator.  Continue Lasix 20 mg daily. Continue Spironolactone 25 mg daily. Nadolol 20 mg every morning. Protonix 40 mg every morning. Lactulose 15 ml twice daily.  Follow up with Dr. [name] in October. The schedule is not available at this time.  Please call the office in 2-3 weeks to make an appointment.  Thank you for choosing me and [facility].   Amy Esterwood, PA-C

## 2018-11-30 ENCOUNTER — Telehealth: Payer: Self-pay | Admitting: Physician Assistant

## 2018-11-30 LAB — AFP TUMOR MARKER: AFP-Tumor Marker: 4.3 ng/mL

## 2018-12-01 NOTE — Telephone Encounter (Signed)
Spoke with the patient. She is advised of the lab results. She denies any issues with swelling of her abdomen or her LE. States she feels well. She is experiencing some cramping of her abdomen, but states she does not thinkit is concerning. She is unsure of anything that makes it better or worse. Does not feel it is associated with her bowel movements. Denies indigestion. She will pay attention over the next 72 hours to see if she can be more specific about the complaint.  MRI on 12/26/18 Follow up appointment 01/01/19 at 2:30 pm with Dr [name].

## 2018-12-01 NOTE — Telephone Encounter (Signed)
Labs are stable. Check with patient if she is developing any ankle edema or distention of stomach, if yes will increase Aldactone to 50 mg daily and continue Lasix 20 mg daily.  If not continue current dose of diuretics. Follow-up office visit with app or myself in 2 to 4 weeks.  Thanks

## 2018-12-01 NOTE — Telephone Encounter (Signed)
Would you look at the patient's labs in Amy Esterwood's absence please? Thank you

## 2018-12-04 ENCOUNTER — Ambulatory Visit (INDEPENDENT_AMBULATORY_CARE_PROVIDER_SITE_OTHER): Payer: Medicare HMO | Admitting: Family Medicine

## 2018-12-04 ENCOUNTER — Encounter: Payer: Self-pay | Admitting: Family Medicine

## 2018-12-04 ENCOUNTER — Other Ambulatory Visit: Payer: Self-pay

## 2018-12-04 ENCOUNTER — Ambulatory Visit
Admission: RE | Admit: 2018-12-04 | Discharge: 2018-12-04 | Disposition: A | Discharge status: Home / Self Care | Payer: Medicare HMO | Source: Ambulatory Visit | Attending: Family Medicine | Admitting: Family Medicine

## 2018-12-04 ENCOUNTER — Telehealth: Payer: Self-pay

## 2018-12-04 VITALS — BP 98/50 | HR 78 | Temp 96.3°F | Ht 64.0 in | Wt 109.4 lb

## 2018-12-04 DIAGNOSIS — Z23 Encounter for immunization: Secondary | ICD-10-CM | POA: Diagnosis not present

## 2018-12-04 DIAGNOSIS — M545 Low back pain, unspecified: Secondary | ICD-10-CM

## 2018-12-04 DIAGNOSIS — Z1231 Encounter for screening mammogram for malignant neoplasm of breast: Secondary | ICD-10-CM

## 2018-12-04 DIAGNOSIS — D500 Iron deficiency anemia secondary to blood loss (chronic): Secondary | ICD-10-CM | POA: Diagnosis not present

## 2018-12-04 LAB — POC URINALSYSI DIPSTICK (AUTOMATED)
Bilirubin, UA: NEGATIVE
Blood, UA: NEGATIVE
Glucose, UA: NEGATIVE
Ketones, UA: NEGATIVE
Leukocytes, UA: NEGATIVE
Nitrite, UA: NEGATIVE
Protein, UA: NEGATIVE
Spec Grav, UA: 1.015 (ref 1.010–1.025)
Urobilinogen, UA: 0.2 E.U./dL
pH, UA: 6 (ref 5.0–8.0)

## 2018-12-04 NOTE — Progress Notes (Signed)
Subjective:    Patient ID: Christine Barton, female    DOB: 05-11-1950, 68 y.o.   MRN: [id number]  HPI Chief Complaint  Patient presents with  . Back Pain    Some lower left back pain. Pt notes a little urinary frequency. Denies F/C/N, no burning with urination. Pt states that her fluid pills were stopped for about 1 week and she is not sure if this is what messed with her kidneys? Pt trying to get a urine sample   This is a 68 yo female who presents today with the above cc. She reports some abdominal symptoms for 1-2 weeks. Thought that was related to lactulose. Also has had some left sided flank pain, "hurt," constant, no relief with position change. Took some tylenol and heat, then used ice. This morning, pain more intermittent, she is currently pain free. No hematuria, some tingling with urination x 1, used monistat x 1, no further tingling. Left flank pain can be quick and sharp. BMs loose with lactulose. She has never had kidney stones.  She has been followed by GI for iron deficiency anemia, chronic liver disease.  She had a decrease in her hemoglobin hematocrit last week and is due for additional blood work this week.  She denies any dizziness, lightheadedness, falls.  Past Medical History:  Diagnosis Date  . Alcohol abuse, unspecified   . Breast cancer ([place]) 1998   Right  . Cataract    left eye  . Cervical spondylosis 2006   MRI  . Degeneration of cervical intervertebral disc 2006   MRI  . Diverticulosis of colon (without mention of hemorrhage)   . Hyperpotassemia   . Microscopic hematuria   . Mononeuritis of unspecified site   . Nonspecific abnormal results of liver function study   . Other abnormal glucose   . Other and unspecified hyperlipidemia    no per pt  . Other chronic nonalcoholic liver disease   . Personal history of chemotherapy   . Personal history of malignant neoplasm of breast   . Personal history of radiation therapy   . Pneumothorax, acute    right,  spontaneous  . Tobacco use disorder   . Unspecified vitamin D deficiency    Past Surgical History:  Procedure Laterality Date  . BREAST BIOPSY  9/03   Right  . BREAST LUMPECTOMY Right 1998  . BREAST LUMPECTOMY  1998  . CHEST TUBE INSERTION  11/02/2014  . COLONOSCOPY    . ESOPHAGOGASTRODUODENOSCOPY (EGD) WITH PROPOFOL N/A 10/30/2018   Procedure: ESOPHAGOGASTRODUODENOSCOPY (EGD) WITH PROPOFOL;  Surgeon: Mauri Pole, MD;  Location: [facility];  Service: Endoscopy;  Laterality: N/A;  . EYE SURGERY  02/2017   cataract extraction with lens implant  . TUBAL LIGATION     Family History  Problem Relation Age of Onset  . Heart failure Father   . Colon cancer Maternal Uncle   . Stroke Mother   . Esophageal cancer Neg Hx   . Rectal cancer Neg Hx   . Stomach cancer Neg Hx    Social History   Tobacco Use  . Smoking status: Current Every Day Smoker    Packs/day: 1.00    Years: 53.00    Pack years: 53.00    Types: Cigarettes  . Smokeless tobacco: Never Used  . Tobacco comment: tobacco info given 04/27/2018  Substance Use Topics  . Alcohol use: Not Currently    Comment: occasional use  . Drug use: No    Review of Systems  Per HPI    Objective:   Physical Exam Vitals signs reviewed.  Constitutional:      General: She is not in acute distress.    Appearance: Normal appearance. She is not ill-appearing, toxic-appearing or diaphoretic.     Comments: Thin.   HENT:     Head: Normocephalic and atraumatic.  Eyes:     Conjunctiva/sclera: Conjunctivae normal.  Cardiovascular:     Rate and Rhythm: Normal rate and regular rhythm.     Heart sounds: Normal heart sounds.  Pulmonary:     Effort: Pulmonary effort is normal.     Breath sounds: Normal breath sounds.  Abdominal:     General: Abdomen is flat. Bowel sounds are normal. There is no distension.     Palpations: Abdomen is soft. There is no mass.     Tenderness: There is no abdominal tenderness. There is no right CVA  tenderness, left CVA tenderness, guarding or rebound.     Hernia: No hernia is present.  Musculoskeletal: Normal range of motion.     Right lower leg: No edema.     Left lower leg: No edema.     Comments: Normal gait, no tenderness of back.   Skin:    General: Skin is warm and dry.  Neurological:     Mental Status: She is alert.       BP (!) 98/50 (BP Location: Left Arm, Patient Position: Sitting, Cuff Size: Normal)   Pulse 78   Temp (!) 96.3 F (35.7 C) (Temporal)   Ht 5' 4"  (1.626 m)   Wt 109 lb 6.4 oz (49.6 kg)   SpO2 98%   BMI 18.78 kg/m     Wt Readings from Last 3 Encounters:  12/04/18 109 lb 6.4 oz (49.6 kg)  11/29/18 111 lb 2 oz (50.4 kg)  11/15/18 110 lb 6 oz (50.1 kg)   BP Readings from Last 3 Encounters:  12/04/18 (!) 98/50  11/29/18 (!) 106/48  11/15/18 120/62   Urinalysis- negative blood, nitrites, leukocytes. SG- 1.015.   Assessment & Plan:  1. Acute low back pain without sciatica, unspecified back pain laterality - negative urine, symptoms improved today, no findings concerning for infection, kidney stone, or other worrisome process, reassured patient, reviewed RTC precautions - POCT Urinalysis Dipstick (Automated)  2. Iron deficiency anemia due to chronic blood loss - will go ahead and check today and send results to GI - CBC with Differential  3. Need for influenza vaccination - Flu Vaccine QUAD High Dose(Fluad)   Clarene Reamer, FNP-BC  [facility], [facility]  12/06/2018 8:17 AM

## 2018-12-04 NOTE — Telephone Encounter (Signed)
Patient is advised.  

## 2018-12-04 NOTE — Telephone Encounter (Signed)
Pt said on and off 1 wk has had lt lower back pain; no burning or pain or frequency with urination; last night had tingling sensation when urinated; pt applied monistat and that seemed to help; no vaginal discharge or irritation; pt said not voiding usual amt when urinates. Pt scheduled in office appt 12/04/18 at 3pm with D Carlean Purl FNP; pt could not schedule earlier; pt has mammogram appt today at 11 AM in [place]. Pt has no covid symptoms, no travel and no known exposure to + covid. ED or UC precautions given and pt voiced understanding.

## 2018-12-04 NOTE — Patient Instructions (Addendum)
It was a pleasure to see you today  I suspect you have a low back sprain or strain, your urine test was normal.   Continue to use heat/ice, small amounts of tylenol as needed  If worsening pain, weakness, blood in urine please get in touch

## 2018-12-04 NOTE — Telephone Encounter (Signed)
Increase lactulose to 20cc 2-3 times daily if she is getting constipation with Iron, goal 3 soft BM per day.

## 2018-12-04 NOTE — Telephone Encounter (Signed)
Called the patient to follow up on her abdominal cramping issues. She states the pain has moved to her flank area and has become constant. She is calling PCP to be evaluated. Also reports she is having some constipation issues since starting oral iron. She states prior to the starting of iron she was able to have 2 soft bowel movements daily. On Chronulac 10 to 15 ml in the morning and sometimes 15 ml in the evening. She states she adjusts it to how she is responding.

## 2018-12-04 NOTE — Telephone Encounter (Signed)
Noted  

## 2018-12-05 LAB — CBC WITH DIFFERENTIAL/PLATELET
Basophils Absolute: 0.1 10*3/uL (ref 0.0–0.1)
Basophils Relative: 1.1 % (ref 0.0–3.0)
Eosinophils Absolute: 0.3 10*3/uL (ref 0.0–0.7)
Eosinophils Relative: 3 % (ref 0.0–5.0)
HCT: 27.6 % — ABNORMAL LOW (ref 36.0–46.0)
Hemoglobin: 8.9 g/dL — ABNORMAL LOW (ref 12.0–15.0)
Lymphocytes Relative: 28.2 % (ref 12.0–46.0)
Lymphs Abs: 2.4 10*3/uL (ref 0.7–4.0)
MCHC: 32.1 g/dL (ref 30.0–36.0)
MCV: 78.9 fl (ref 78.0–100.0)
Monocytes Absolute: 1.2 10*3/uL — ABNORMAL HIGH (ref 0.1–1.0)
Monocytes Relative: 13.8 % — ABNORMAL HIGH (ref 3.0–12.0)
Neutro Abs: 4.6 10*3/uL (ref 1.4–7.7)
Neutrophils Relative %: 53.9 % (ref 43.0–77.0)
Platelets: 63 10*3/uL — ABNORMAL LOW (ref 150.0–400.0)
RBC: 3.5 Mil/uL — ABNORMAL LOW (ref 3.87–5.11)
RDW: 20.7 % — ABNORMAL HIGH (ref 11.5–15.5)
WBC: 8.5 10*3/uL (ref 4.0–10.5)

## 2018-12-06 ENCOUNTER — Encounter: Payer: Self-pay | Admitting: Family Medicine

## 2018-12-07 ENCOUNTER — Other Ambulatory Visit: Payer: Self-pay

## 2018-12-07 ENCOUNTER — Ambulatory Visit (INDEPENDENT_AMBULATORY_CARE_PROVIDER_SITE_OTHER): Payer: Medicare HMO | Admitting: Family Medicine

## 2018-12-07 ENCOUNTER — Telehealth: Payer: Self-pay | Admitting: Family Medicine

## 2018-12-07 ENCOUNTER — Ambulatory Visit (INDEPENDENT_AMBULATORY_CARE_PROVIDER_SITE_OTHER)
Admission: RE | Admit: 2018-12-07 | Discharge: 2018-12-07 | Disposition: A | Discharge status: Home / Self Care | Payer: Medicare HMO | Source: Ambulatory Visit | Attending: Family Medicine | Admitting: Family Medicine

## 2018-12-07 ENCOUNTER — Encounter: Payer: Self-pay | Admitting: Family Medicine

## 2018-12-07 DIAGNOSIS — F17200 Nicotine dependence, unspecified, uncomplicated: Secondary | ICD-10-CM

## 2018-12-07 DIAGNOSIS — M545 Low back pain, unspecified: Secondary | ICD-10-CM

## 2018-12-07 NOTE — Telephone Encounter (Signed)
error 

## 2018-12-07 NOTE — Telephone Encounter (Signed)
Patient called in about her severe back pain. She stated she did not sleep at all due to the pain and she is not sure what else she can do at this point. Patient stated she has taken tylenol and is scared to take anymore but needs relief. We scheduled her for an appointment at 11:00 today but patient wanted to know what she can do until then for all the pain she is having.   C/B # [phone]

## 2018-12-07 NOTE — Assessment & Plan Note (Signed)
Suspect muscular spasm based on pattern of pain  LS xray today  Limited opt for pain control (no nsaid due to gi bleed, no acetaminophen due to cirrhosis)  Pt has flexeril -will try 5 mg tid prn for pain  Heat/ice Handout with stretches given  Plan to f/u rad review

## 2018-12-07 NOTE — Assessment & Plan Note (Signed)
Disc in detail risks of smoking and possible outcomes including copd, vascular/ heart disease, cancer , respiratory and sinus infections  Pt voices understanding Not ready to quit unfortunately

## 2018-12-07 NOTE — Patient Instructions (Addendum)
Try heat/ice on your back  Also stretching  Xray today  Try the 5 mg of flexeril when pain gets bad (be very careful about sedation /falls or confusion)   We can make more of a plan when xray is read

## 2018-12-07 NOTE — Telephone Encounter (Signed)
I am just seeing this now-appt is at 11, I will see her then

## 2018-12-07 NOTE — Progress Notes (Signed)
Subjective:    Patient ID: Christine Barton, female    DOB: [birth year], 68 y.o.   MRN: [id number]  HPI Here with back pain  Back pain started Sunday night -about 9 pm  (may have overdone it that day) Left lower back  Constant pain when it hurts   Is on and off -mild right now   Deep ache /throbbing No radiation to legs No numbness or tingling   On 8/24 urine was clear  No burning to urinate  No blood in urine   Patient Active Problem List   Diagnosis Date Noted  . Low back pain 12/07/2018  . Venous insufficiency 11/15/2018  . Hyponatremia 11/07/2018  . Elevated serum creatinine 11/07/2018  . Esophageal varices without bleeding ([place]) 10/27/2018  . Anemia 10/23/2018  . Fatigue 10/23/2018  . Poor balance 05/30/2018  . Falls 05/30/2018  . Generalized weakness 05/30/2018  . Cirrhosis of liver ([place]) 04/24/2018  . Ascites 04/24/2018  . Gallstones 04/24/2018  . Heme positive stool 04/24/2018  . Screening mammogram, encounter for 11/07/2017  . Smoker 11/07/2017  . Screening examination for STD (sexually transmitted disease) 11/11/2016  . Welcome to Medicare preventive visit 10/26/2016  . Estrogen deficiency 10/26/2016  . Colon cancer screening 11/01/2014  . Elevated liver enzymes 11/01/2014  . Encounter for routine gynecological examination 09/12/2013  . Rapid heart beat 10/10/2012  . Heartburn 08/20/2011  . Routine general medical examination at a health care facility 07/21/2011  . Vitamin D deficiency 08/04/2009  . POSTMENOPAUSAL STATUS 08/04/2009  . Controlled diabetes mellitus type 2 with complications ([place]) 42/59/5638  . Hyperlipidemia 06/14/2008  . [place] DISEASE, CERVICAL 03/30/2007  . History of alcohol abuse 12/06/2006  . Neuropathy of both feet 12/06/2006  . DIVERTICULOSIS, COLON 12/06/2006  . Fatty liver 12/06/2006  . BREAST CANCER, HX OF 12/06/2006   Past Medical History:  Diagnosis Date  . Alcohol abuse, unspecified   . Breast cancer ([place]) 1998   Right   . Cataract    left eye  . Cervical spondylosis 2006   MRI  . Degeneration of cervical intervertebral disc 2006   MRI  . Diverticulosis of colon (without mention of hemorrhage)   . Hyperpotassemia   . Microscopic hematuria   . Mononeuritis of unspecified site   . Nonspecific abnormal results of liver function study   . Other abnormal glucose   . Other and unspecified hyperlipidemia    no per pt  . Other chronic nonalcoholic liver disease   . Personal history of chemotherapy   . Personal history of malignant neoplasm of breast   . Personal history of radiation therapy   . Pneumothorax, acute    right, spontaneous  . Tobacco use disorder   . Unspecified vitamin D deficiency    Past Surgical History:  Procedure Laterality Date  . BREAST BIOPSY  9/03   Right  . BREAST LUMPECTOMY Right 1998  . BREAST LUMPECTOMY  1998  . CHEST TUBE INSERTION  11/02/2014  . COLONOSCOPY    . ESOPHAGOGASTRODUODENOSCOPY (EGD) WITH PROPOFOL N/A 10/30/2018   Procedure: ESOPHAGOGASTRODUODENOSCOPY (EGD) WITH PROPOFOL;  Surgeon: Mauri Pole, MD;  [facility];  Service: Endoscopy;  Laterality: N/A;  . EYE SURGERY  02/2017   cataract extraction with lens implant  . TUBAL LIGATION     Social History   Tobacco Use  . Smoking status: Current Every Day Smoker    Packs/day: 1.00    Years: 53.00    Pack years: 53.00  Types: Cigarettes  . Smokeless tobacco: Never Used  . Tobacco comment: tobacco info given 04/27/2018  Substance Use Topics  . Alcohol use: Not Currently    Comment: occasional use  . Drug use: No   Family History  Problem Relation Age of Onset  . Heart failure Father   . Colon cancer Maternal Uncle   . Stroke Mother   . Esophageal cancer Neg Hx   . Rectal cancer Neg Hx   . Stomach cancer Neg Hx    Allergies  Allergen Reactions  . Oxycodone Nausea And Vomiting  . Glipizide Other (See Comments)    Stomach pain  . Metformin And Related Other (See Comments)     Stomach pain   Current Outpatient Medications on File Prior to Visit  Medication Sig Dispense Refill  . acetaminophen (TYLENOL) 500 MG tablet Take 500 mg by mouth 2 (two) times a day. Uses only as needed    . albuterol (VENTOLIN HFA) 108 (90 Base) MCG/ACT inhaler Inhale 2 puffs into the lungs every 4 (four) hours as needed for wheezing or shortness of breath. 18 g 3  . cyclobenzaprine (FLEXERIL) 10 MG tablet TAKE 0.5 TABLETS (5 MG TOTAL) BY MOUTH AT BEDTIME AS NEEDED FOR MUSCLE SPASMS. 15 tablet 1  . doxylamine, Sleep, (UNISOM) 25 MG tablet Take 25 mg by mouth at bedtime as needed for sleep.     . furosemide (LASIX) 20 MG tablet Take 20 mg by mouth daily.     . iron polysaccharides (NIFEREX) 150 MG capsule Take 1 capsule (150 mg total) by mouth 2 (two) times daily. 60 capsule 3  . lactulose (CHRONULAC) 10 GM/15ML solution Take 15 mLs (10 g total) by mouth 3 (three) times daily. (Patient taking differently: Take 20 g by mouth See admin instructions. Take 20 g by mouth twice daily, may take a third 20 g dose as needed for constipation) 236 mL 11  . mupirocin ointment (BACTROBAN) 2 % APPLY A SMALL AMOUNT TO AFFECTED AREA ON INSIDE OF NOSE 2 TIMES DAILY AS DIRECTED (Patient taking differently: Place 1 application into the nose 2 (two) times daily as needed (sores). ) 22 g 1  . nadolol (CORGARD) 20 MG tablet Take 1 tablet (20 mg total) by mouth daily. 30 tablet 11  . pantoprazole (PROTONIX) 40 MG tablet Take 1 tablet (40 mg total) by mouth daily. 90 tablet 3  . Polyethyl Glycol-Propyl Glycol (SYSTANE OP) Place 1 drop into the left eye daily as needed (dryness).    Marland Kitchen spironolactone (ALDACTONE) 25 MG tablet Take 25 mg by mouth daily.     No current facility-administered medications on file prior to visit.     Review of Systems  Constitutional: Positive for fatigue. Negative for activity change, appetite change, fever and unexpected weight change.  HENT: Negative for congestion, ear pain, rhinorrhea,  sinus pressure and sore throat.   Eyes: Negative for pain, redness and visual disturbance.  Respiratory: Negative for cough, shortness of breath and wheezing.   Cardiovascular: Negative for chest pain and palpitations.  Gastrointestinal: Negative for abdominal pain, blood in stool, constipation and diarrhea.       Cirrhosis No swelling lately  Endocrine: Negative for polydipsia and polyuria.  Genitourinary: Negative for dysuria, frequency and urgency.  Musculoskeletal: Negative for arthralgias, back pain and myalgias.  Skin: Negative for pallor and rash.  Allergic/Immunologic: Negative for environmental allergies.  Neurological: Negative for dizziness, syncope and headaches.  Hematological: Negative for adenopathy. Does not bruise/bleed easily.  Psychiatric/Behavioral:  Negative for decreased concentration and dysphoric mood. The patient is not nervous/anxious.        Objective:   Physical Exam Constitutional:      General: She is not in acute distress.    Appearance: She is well-developed. She is not ill-appearing.     Comments: Frail appearing  Underweight    HENT:     Head: Normocephalic and atraumatic.     Mouth/Throat:     Mouth: Mucous membranes are moist.  Eyes:     General: No scleral icterus.    Conjunctiva/sclera: Conjunctivae normal.     Pupils: Pupils are equal, round, and reactive to light.  Neck:     Musculoskeletal: Normal range of motion and neck supple.  Cardiovascular:     Rate and Rhythm: Normal rate and regular rhythm.     Pulses: Normal pulses.  Pulmonary:     Effort: Pulmonary effort is normal.     Breath sounds: Normal breath sounds. No wheezing or rales.     Comments: Diffusely distant bs  Abdominal:     General: Bowel sounds are normal. There is no distension.     Palpations: Abdomen is soft.     Tenderness: There is no abdominal tenderness.     Comments: Hepatomegaly baseline   Musculoskeletal:        General: Tenderness present.     Right  shoulder: She exhibits decreased range of motion and spasm. She exhibits no tenderness, no bony tenderness, no swelling, no effusion, no crepitus and normal strength.     Lumbar back: She exhibits decreased range of motion, tenderness and spasm. She exhibits no bony tenderness and no edema.     Right lower leg: No edema.     Left lower leg: No edema.     Comments: Loss of lumbar lordosis  Spasm of lumbar musculature-worse on the L  Neg slr  No neuro changes   Lymphadenopathy:     Cervical: No cervical adenopathy.  Skin:    General: Skin is warm and dry.     Coloration: Skin is not pale.     Findings: No erythema or rash.     Comments: No jaundice  Neurological:     Mental Status: She is alert.     Cranial Nerves: No cranial nerve deficit.     Sensory: No sensory deficit.     Motor: No atrophy or abnormal muscle tone.     Coordination: Coordination normal.     Deep Tendon Reflexes: Reflexes are normal and symmetric. Reflexes normal.     Comments: Negative SLR  Psychiatric:        Mood and Affect: Mood is anxious.           Assessment & Plan:   Problem List Items Addressed This Visit      Other   Smoker    Disc in detail risks of smoking and possible outcomes including copd, vascular/ heart disease, cancer , respiratory and sinus infections  Pt voices understanding Not ready to quit unfortunately      Low back pain    Suspect muscular spasm based on pattern of pain  LS xray today  Limited opt for pain control (no nsaid due to gi bleed, no acetaminophen due to cirrhosis)  Pt has flexeril -will try 5 mg tid prn for pain  Heat/ice Handout with stretches given  Plan to f/u rad review       Relevant Orders   DG Lumbar Spine Complete (Completed)

## 2018-12-08 ENCOUNTER — Telehealth: Payer: Self-pay | Admitting: Family Medicine

## 2018-12-08 ENCOUNTER — Telehealth: Payer: Self-pay | Admitting: Physician Assistant

## 2018-12-08 ENCOUNTER — Other Ambulatory Visit: Payer: Self-pay

## 2018-12-08 DIAGNOSIS — B1920 Unspecified viral hepatitis C without hepatic coma: Secondary | ICD-10-CM

## 2018-12-08 DIAGNOSIS — M545 Low back pain, unspecified: Secondary | ICD-10-CM

## 2018-12-08 MED ORDER — TRAMADOL HCL 50 MG PO TABS: 50.0000 mg | ORAL_TABLET | Freq: Three times a day (TID) | ORAL | 0 refills | Status: AC | PRN

## 2018-12-08 NOTE — Telephone Encounter (Signed)
Patient saw Dr.Tower yesterday.  Patient said she's in a lot of pain. Patient said she took the Flexeril and it didn't help the pain it just made her loopy.  Patient said she tried the heat and ice, it helped a little bit and made the pain more tolerable.  Patient said she's tired of hurting. Patient uses [facility].

## 2018-12-08 NOTE — Telephone Encounter (Signed)
Urgent ref done Will send to [facility]

## 2018-12-08 NOTE — Telephone Encounter (Signed)
Pt notified of xray results and Dr. Marliss Coots comments off of mychart and comments off of this message. Pt will try new med and keep [place] posted.   Pt agrees with PT referral, I advised pt our [facility] will call and schedule appt.  Pt said nothing is making pain worse, she woke up and it's been a constant pain it doesn't change with movement it's just been there all day

## 2018-12-08 NOTE — Telephone Encounter (Signed)
Discussed further titration of the chronulac in the setting of pain medication for her back. Advised again that her goal is 3 soft bowel movements each day.

## 2018-12-08 NOTE — Telephone Encounter (Signed)
Appt made and patient aware.  

## 2018-12-08 NOTE — Telephone Encounter (Signed)
I sent tramadol  Use with great caution of falls/sedation  Can constipate  Can be habit forming-use with care   Take with food  Keep [place] posted   Her pain seems to come and go -has she been able to figure out what makes it worse (position or activity wise)?  I want to refer her to PT to help (they can often help with pain also)  Let me know if agreeable

## 2018-12-08 NOTE — Telephone Encounter (Signed)
Pt called stating that she was returning your call, she stated that she needs to speak with you about medications, she did not want to disclose more information.

## 2018-12-11 DIAGNOSIS — E113292 Type 2 diabetes mellitus with mild nonproliferative diabetic retinopathy without macular edema, left eye: Secondary | ICD-10-CM | POA: Diagnosis not present

## 2018-12-11 DIAGNOSIS — H2511 Age-related nuclear cataract, right eye: Secondary | ICD-10-CM | POA: Diagnosis not present

## 2018-12-11 LAB — HM DIABETES EYE EXAM

## 2018-12-13 ENCOUNTER — Telehealth: Payer: Self-pay | Admitting: Family Medicine

## 2018-12-13 DIAGNOSIS — M545 Low back pain, unspecified: Secondary | ICD-10-CM

## 2018-12-13 NOTE — Telephone Encounter (Signed)
Pt said she has an appt with PT tomorrow but she is in so much pain she said she needs some relief before then. Pt said that the pain is in the same spot in her back but the pain meds Dr. Glori Bickers prescribed helped her muscle spasms but it hasn't helped the pain in her "spine" pt said her spine pain is severe it's to the point she can't do anything. Pt said she can't eat or sleep due to pain and she has lost 3 lbs because of the pain. Pt said that it's a dull achy constant pain in her spine. Pt said she has seen an ortho doc in the past and they gave her a "silicone shot" and she wanted to see if Dr. Glori Bickers thought it was a good idea to see another ortho doc to get another shot. Pt said she would go to whoever Dr. Glori Bickers referred her to, but she also asked for help with pain until her appt

## 2018-12-13 NOTE — Telephone Encounter (Signed)
I placed an urgent ortho referral  Start PT as planned as well   The last thing I gave her was tramadol  That is the strongest thing I can give - inst were 1 pill tid prn and we can with caution increase to 1-2 pills tid prn  How many does she have left right now?

## 2018-12-13 NOTE — Telephone Encounter (Signed)
Best number [phone] Pt called stating she is having severe back pain and wanted to know what else she can to do She saw dr tower 8/27 for this   [facility]

## 2018-12-13 NOTE — Telephone Encounter (Addendum)
Pt notified of urgent referral and Dr. Marliss Coots comments. Pt said she was only taking tramadol at bedtime so she has around 10 pills left but she will start taking med TID to see if it helps sxs, also Rosaria Ferries made Ortho appt for tomorrow

## 2018-12-13 NOTE — Telephone Encounter (Signed)
Has she started PT yet? Has her pain changed in location or intensity ?

## 2018-12-14 DIAGNOSIS — M545 Low back pain: Secondary | ICD-10-CM | POA: Diagnosis not present

## 2018-12-14 DIAGNOSIS — M542 Cervicalgia: Secondary | ICD-10-CM | POA: Diagnosis not present

## 2018-12-14 DIAGNOSIS — M7918 Myalgia, other site: Secondary | ICD-10-CM | POA: Diagnosis not present

## 2018-12-14 NOTE — Telephone Encounter (Signed)
Given she is not tolerating Lactulose, please send Rx for Rifaximin 548m BID. Advise her to take 10cc lactulose 2-3 times daily with goal 3BM per day until she gets the Rx for Rifaximin

## 2018-12-14 NOTE — Telephone Encounter (Addendum)
Amy or Dr Galen Manila with the patient. She is having a lot of stomach cramping with Lactulose. She is wanting to come off of it due to this. She asks if there are other options.

## 2018-12-15 ENCOUNTER — Other Ambulatory Visit: Payer: Self-pay

## 2018-12-15 MED ORDER — RIFAXIMIN 550 MG PO TABS: 550.0000 mg | ORAL_TABLET | Freq: Two times a day (BID) | ORAL | 6 refills | Status: DC

## 2018-12-15 NOTE — Telephone Encounter (Signed)
Pt returned your call.  

## 2018-12-15 NOTE — Telephone Encounter (Signed)
Rx sent to [facility]. If co-pay is high, I am hoping [facility] will help find funding to cover her costs. Called the patient. No answer. Will try again later today.

## 2018-12-15 NOTE — Telephone Encounter (Signed)
Spoke with the patient. She understands she will receive a call from [facility] about the prescription costs and options.

## 2018-12-15 NOTE — Telephone Encounter (Signed)
Ok thanks 

## 2018-12-20 ENCOUNTER — Telehealth: Payer: Self-pay | Admitting: Gastroenterology

## 2018-12-20 NOTE — Telephone Encounter (Signed)
Spoke with the patient. Advised her of the contact phone number for [facility].  Advised her they will be contacting her or she may call them. Her insurance has a very high co-pay on the Xifaxan ($917) and they want to screen her for patient assistance programs.

## 2018-12-21 ENCOUNTER — Telehealth: Payer: Self-pay | Admitting: Gastroenterology

## 2018-12-21 NOTE — Telephone Encounter (Signed)
Patient has spoken with [facility] and given them her financial information to hopefully get Xifaxan patient assistance

## 2018-12-22 ENCOUNTER — Other Ambulatory Visit: Payer: Self-pay | Admitting: Family Medicine

## 2018-12-22 NOTE — Telephone Encounter (Signed)
Med was d/c on 12/07/18 saying she completed course so it's not on med list anymore, please advise

## 2018-12-25 ENCOUNTER — Other Ambulatory Visit: Payer: Self-pay | Admitting: Gastroenterology

## 2018-12-26 ENCOUNTER — Ambulatory Visit
Admission: RE | Admit: 2018-12-26 | Discharge: 2018-12-26 | Disposition: A | Discharge status: Home / Self Care | Payer: Medicare HMO | Source: Ambulatory Visit | Attending: Gastroenterology | Admitting: Gastroenterology

## 2018-12-26 ENCOUNTER — Other Ambulatory Visit: Payer: Self-pay

## 2018-12-26 DIAGNOSIS — K7031 Alcoholic cirrhosis of liver with ascites: Secondary | ICD-10-CM

## 2018-12-26 DIAGNOSIS — K8689 Other specified diseases of pancreas: Secondary | ICD-10-CM | POA: Diagnosis not present

## 2018-12-26 DIAGNOSIS — K7460 Unspecified cirrhosis of liver: Secondary | ICD-10-CM | POA: Diagnosis not present

## 2018-12-26 DIAGNOSIS — K8020 Calculus of gallbladder without cholecystitis without obstruction: Secondary | ICD-10-CM | POA: Diagnosis not present

## 2018-12-26 MED ORDER — GADOBENATE DIMEGLUMINE 529 MG/ML IV SOLN
10.0000 mL | Freq: Once | INTRAVENOUS | Status: AC | PRN
  Administered 2018-12-26: 10 mL via INTRAVENOUS

## 2018-12-27 ENCOUNTER — Encounter: Payer: Self-pay | Admitting: Family Medicine

## 2018-12-28 ENCOUNTER — Telehealth: Payer: Self-pay | Admitting: Family Medicine

## 2018-12-28 DIAGNOSIS — Z122 Encounter for screening for malignant neoplasm of respiratory organs: Secondary | ICD-10-CM

## 2018-12-28 DIAGNOSIS — F17200 Nicotine dependence, unspecified, uncomplicated: Secondary | ICD-10-CM

## 2018-12-28 NOTE — Telephone Encounter (Signed)
Called pt and she has a few questions regarding her overall care.  1. (see telephone note in care everywhere) pt said she is new to [employer] ([employer] too) in [employer]. Pt said [facility]'s mychart said she was due for a low contrast lung cancer screening CT so she called and scheduled it but now they said that she can't come to her appt unless they get an order from her PCP.  2. Pt said she sent Dr. Glori Bickers a mychart regarding wanting new pain med. I could see mychart is routed to Dr. Glori Bickers already.  2. Pt said it is saying she is over due for 3 things. 1-a urine protein check, 2- foot exam but she thinks Dr. Glori Bickers examined her feet last time she was here. 3- DM eye exam. Pt said she had her DM eye exam in Aug 2020 I advise pt to have eye doctor fax [place] over a copy of the eye exam and we can update her check

## 2018-12-28 NOTE — Telephone Encounter (Signed)
Patient requested a call back. Wanted to give an update on her MRI and cancer screening

## 2018-12-28 NOTE — Telephone Encounter (Signed)
I don't know anything about [facility] lung cancer screening program/protocol or orders  We have a program through [facility] - I have to order that in a specific way and it is not always covered by insurance  Please make sure she knows her insurance will cover this before I order it  Does she know exactly what I need to order?   We will address the microalbumin at f/u in oct  Foot exam was done in august  We need a copy of her eye exam  I will respond to her re: lidocaine patch on mychart

## 2018-12-29 ENCOUNTER — Telehealth: Payer: Self-pay | Admitting: Gastroenterology

## 2018-12-29 NOTE — Telephone Encounter (Signed)
Pt would like to relay information from [facility].

## 2018-12-29 NOTE — Telephone Encounter (Signed)
The patient has been approved for patient assistance with Xifaxan.  The forms are supposed to be mailed to her and faxed to [place]. Call [facility] on Monday 01/01/19 to get the forms faxed. The patient has an appointment with Dr [name] at 2:30 01/01/19 and wants to fill in her part then.

## 2018-12-29 NOTE — Telephone Encounter (Signed)
Pt notified of Dr. Marliss Coots comments and verbalized understanding. Pamphlet for our lung cancer screening program mailed to pt and their phone # was given to her. She will call insurance to check cost and price and see if she want's to pursue getting it done

## 2018-12-29 NOTE — Telephone Encounter (Signed)
Pt is requesting "a call today before you leave"

## 2019-01-01 ENCOUNTER — Other Ambulatory Visit: Payer: Self-pay

## 2019-01-01 ENCOUNTER — Encounter: Payer: Self-pay | Admitting: Gastroenterology

## 2019-01-01 ENCOUNTER — Other Ambulatory Visit (INDEPENDENT_AMBULATORY_CARE_PROVIDER_SITE_OTHER): Payer: Medicare HMO

## 2019-01-01 ENCOUNTER — Ambulatory Visit: Payer: Medicare HMO | Admitting: Gastroenterology

## 2019-01-01 ENCOUNTER — Telehealth: Payer: Self-pay | Admitting: Gastroenterology

## 2019-01-01 VITALS — BP 142/76 | HR 62 | Temp 97.6°F | Ht 64.0 in | Wt 101.0 lb

## 2019-01-01 DIAGNOSIS — K7682 Hepatic encephalopathy: Secondary | ICD-10-CM

## 2019-01-01 DIAGNOSIS — K7290 Hepatic failure, unspecified without coma: Secondary | ICD-10-CM | POA: Diagnosis not present

## 2019-01-01 DIAGNOSIS — D508 Other iron deficiency anemias: Secondary | ICD-10-CM | POA: Diagnosis not present

## 2019-01-01 DIAGNOSIS — K7460 Unspecified cirrhosis of liver: Secondary | ICD-10-CM

## 2019-01-01 DIAGNOSIS — K7030 Alcoholic cirrhosis of liver without ascites: Secondary | ICD-10-CM | POA: Diagnosis not present

## 2019-01-01 LAB — CBC WITH DIFFERENTIAL/PLATELET
Basophils Absolute: 0 10*3/uL (ref 0.0–0.1)
Basophils Relative: 0.4 % (ref 0.0–3.0)
Eosinophils Absolute: 0.1 10*3/uL (ref 0.0–0.7)
Eosinophils Relative: 0.9 % (ref 0.0–5.0)
HCT: 32 % — ABNORMAL LOW (ref 36.0–46.0)
Hemoglobin: 10 g/dL — ABNORMAL LOW (ref 12.0–15.0)
Lymphocytes Relative: 21 % (ref 12.0–46.0)
Lymphs Abs: 2.2 10*3/uL (ref 0.7–4.0)
MCHC: 31.4 g/dL (ref 30.0–36.0)
MCV: 79.7 fl (ref 78.0–100.0)
Monocytes Absolute: 0.9 10*3/uL (ref 0.1–1.0)
Monocytes Relative: 8.7 % (ref 3.0–12.0)
Neutro Abs: 7.1 10*3/uL (ref 1.4–7.7)
Neutrophils Relative %: 69 % (ref 43.0–77.0)
Platelets: 158 10*3/uL (ref 150.0–400.0)
RBC: 4.01 Mil/uL (ref 3.87–5.11)
RDW: 21 % — ABNORMAL HIGH (ref 11.5–15.5)
WBC: 10.4 10*3/uL (ref 4.0–10.5)

## 2019-01-01 LAB — PROTIME-INR
INR: 1.3 ratio — ABNORMAL HIGH (ref 0.8–1.0)
Prothrombin Time: 15.5 s — ABNORMAL HIGH (ref 9.6–13.1)

## 2019-01-01 MED ORDER — RIFAXIMIN 550 MG PO TABS: 550.0000 mg | ORAL_TABLET | Freq: Two times a day (BID) | ORAL | 11 refills | Status: DC

## 2019-01-01 NOTE — Progress Notes (Signed)
Christine Barton    [id number]    November 21, 1950  Primary Care Physician:Tower, [name], MD  Referring Physician: Tower, [name], MD [place],  [place] [address]   Chief complaint:  Cirrhosis  HPI:  68 year old female with history of EtOH cirrhosis here for follow-up visit.  She did not tolerate oral iron tablets, had significant stomach upset with abdominal discomfort and cramping.  Decreased appetite and has lost over 12 pounds in the past month. She is also having trouble taking lactulose, tolerating low dose with 1-2 soft bowel movements daily.  Await approval from insurance for Xifaxan. Denies any nausea, vomiting, melena or bright red blood per rectum    Outpatient Encounter Medications as of 01/01/2019  Medication Sig  . acetaminophen (TYLENOL) 500 MG tablet Take 500 mg by mouth 2 (two) times a day. Uses only as needed  . albuterol (VENTOLIN HFA) 108 (90 Base) MCG/ACT inhaler Inhale 2 puffs into the lungs every 4 (four) hours as needed for wheezing or shortness of breath.  . doxylamine, Sleep, (UNISOM) 25 MG tablet Take 25 mg by mouth at bedtime as needed for sleep.   . furosemide (LASIX) 20 MG tablet Take 20 mg by mouth daily.   Marland Kitchen lactulose (CHRONULAC) 10 GM/15ML solution Take 15 mLs (10 g total) by mouth 3 (three) times daily. (Patient taking differently: Take 20 g by mouth See admin instructions. 2 tablespoons daily)  . nadolol (CORGARD) 20 MG tablet Take 1 tablet (20 mg total) by mouth daily.  . pantoprazole (PROTONIX) 40 MG tablet Take 1 tablet (40 mg total) by mouth daily.  [name] Glycol-Propyl Glycol (SYSTANE OP) Place 1 drop into the left eye daily as needed (dryness).  Marland Kitchen spironolactone (ALDACTONE) 25 MG tablet Take 25 mg by mouth daily.  . rifaximin (XIFAXAN) 550 MG TABS tablet Take 1 tablet (550 mg total) by mouth 2 (two) times daily. (Patient not taking: Reported on 01/01/2019)  . [DISCONTINUED] cyclobenzaprine (FLEXERIL) 10 MG tablet  TAKE 0.5 TABLETS (5 MG TOTAL) BY MOUTH AT BEDTIME AS NEEDED FOR MUSCLE SPASMS.  . [DISCONTINUED] iron polysaccharides (NIFEREX) 150 MG capsule Take 1 capsule (150 mg total) by mouth 2 (two) times daily.  . [DISCONTINUED] mupirocin ointment (BACTROBAN) 2 % APPLY A SMALL AMOUNT TO AFFECTED AREA ON INSIDE OF NOSE 2 TIMES DAILY AS DIRECTED (Patient taking differently: Place 1 application into the nose 2 (two) times daily as needed (sores). )  . [DISCONTINUED] nystatin (MYCOSTATIN) 100000 UNIT/ML suspension TAKE 5 MLS (500,000 UNITS TOTAL) BY MOUTH 3 (THREE) TIMES DAILY. SWISH AND SWALLOW   No facility-administered encounter medications on file as of 01/01/2019.     Allergies as of 01/01/2019 - Review Complete 12/07/2018  Allergen Reaction Noted  . Oxycodone Nausea And Vomiting 11/02/2014  . Glipizide Other (See Comments) 09/12/2013  . Metformin and related Other (See Comments) 02/02/2013    Past Medical History:  Diagnosis Date  . Alcohol abuse, unspecified   . Breast cancer ([place]) 1998   Right  . Cataract    left eye  . Cervical spondylosis 2006   MRI  . Degeneration of cervical intervertebral disc 2006   MRI  . Diverticulosis of colon (without mention of hemorrhage)   . Hyperpotassemia   . Microscopic hematuria   . Mononeuritis of unspecified site   . Nonspecific abnormal results of liver function study   . Other abnormal glucose   . Other and  unspecified hyperlipidemia    no per pt  . Other chronic nonalcoholic liver disease   . Personal history of chemotherapy   . Personal history of malignant neoplasm of breast   . Personal history of radiation therapy   . Pneumothorax, acute    right, spontaneous  . Tobacco use disorder   . Unspecified vitamin D deficiency     Past Surgical History:  Procedure Laterality Date  . BREAST BIOPSY  9/03   Right  . BREAST LUMPECTOMY Right 1998  . BREAST LUMPECTOMY  1998  . CHEST TUBE INSERTION  11/02/2014  . COLONOSCOPY    .  ESOPHAGOGASTRODUODENOSCOPY (EGD) WITH PROPOFOL N/A 10/30/2018   Procedure: ESOPHAGOGASTRODUODENOSCOPY (EGD) WITH PROPOFOL;  Surgeon: Mauri Pole, MD;  Location: [facility];  Service: Endoscopy;  Laterality: N/A;  . EYE SURGERY  02/2017   cataract extraction with lens implant  . TUBAL LIGATION      Family History  Problem Relation Age of Onset  . Heart failure Father   . Colon cancer Maternal Uncle   . Stroke Mother   . Esophageal cancer Neg Hx   . Rectal cancer Neg Hx   . Stomach cancer Neg Hx     Social History   Socioeconomic History  . Marital status: Single    Spouse name: Not on file  . Number of children: 1  . Years of education: Not on file  . Highest education level: Not on file  Occupational History  . Occupation: retired    Fish farm manager: [employer]  Social Needs  . Financial resource strain: Not hard at all  . Food insecurity    Worry: Never true    Inability: Never true  . Transportation needs    Medical: No    Non-medical: No  Tobacco Use  . Smoking status: Current Every Day Smoker    Packs/day: 1.00    Years: 53.00    Pack years: 53.00    Types: Cigarettes  . Smokeless tobacco: Never Used  . Tobacco comment: tobacco info given 04/27/2018  Substance and Sexual Activity  . Alcohol use: Not Currently    Comment: occasional use  . Drug use: No  . Sexual activity: Not Currently  Lifestyle  . Physical activity    Days per week: 0 days    Minutes per session: 0 min  . Stress: Only a little  Relationships  . Social Herbalist on phone: Not on file    Gets together: Not on file    Attends religious service: Not on file    Active member of club or organization: Not on file    Attends meetings of clubs or organizations: Not on file    Relationship status: Not on file  . Intimate partner violence    Fear of current or ex partner: No    Emotionally abused: No    Physically abused: No    Forced sexual activity: No  Other Topics  Concern  . Not on file  Social History Narrative   Divorced      1 child      Works at [employer] of systems: Review of Systems  Constitutional: Negative for fever and chills. Positive for fatigue HENT: Negative.   Eyes: Negative for blurred vision.  Respiratory: Negative for cough, shortness of breath and wheezing.   Cardiovascular: Negative for chest pain and palpitations.  Gastrointestinal: as per HPI Genitourinary:  Negative for dysuria, urgency, frequency and hematuria.  Musculoskeletal: Negative for myalgias, back pain and joint pain.  Skin: Negative for itching and rash.  Neurological: Negative for dizziness, tremors, focal weakness, seizures and loss of consciousness.  Endo/Heme/Allergies: Positive for seasonal allergies.  Psychiatric/Behavioral: Negative for depression, suicidal ideas and hallucinations.  All other systems reviewed and are negative.   Physical Exam: Vitals:   01/01/19 1437  BP: (!) 142/76  Pulse: 62  Temp: 97.6 F (36.4 C)   Body mass index is 17.34 kg/m. Gen:      No acute distress HEENT:  EOMI, sclera anicteric Neck:     No masses; no thyromegaly Lungs:    Clear to auscultation bilaterally; normal respiratory effort CV:         Regular rate and rhythm; no murmurs Abd:      + bowel sounds; soft, non-tender; no palpable masses, no distension Ext:    No edema; adequate peripheral perfusion Skin:      Warm and dry; no rash Neuro: alert and oriented x 3 Psych: normal mood and affect  Data Reviewed:  Reviewed labs, radiology imaging, old records and pertinent past GI work up   Assessment and Plan/Recommendations:  68 year old female with history of breast cancer, diabetes, [place]/EtOH cirrhosis decompensated by ascites and hepatic encephalopathy  Ascites: Stable with no evidence of volume overload or significant ascites on exam Continue low-dose furosemide 20 mg daily and Aldactone 25 mg daily  Did not tolerate  oral iron, will schedule for IV Feraheme X2  Follow-up CBC, iron panel, CMP, PT and INR  Hepatic encephalopathy: Await approval from insurance for Xifaxan 550 mg twice daily Continue lactulose with goal 2-3 soft bowel movements daily  HCC  screening: Abdominal ultrasound January 2020 negative for any focal liver lesions.  AFP August 2020 within normal limits  Esophageal varices small grade 1 based on endoscopy February 2020  Small frequent meals, continue Glucerna  Follow-up in 4 to 6 weeks  25 minutes was spent face-to-face with the patient. Greater than 50% of the time used for counseling as well as treatment plan and follow-up. She had multiple questions which were answered to her satisfaction  K. Denzil Magnuson , MD    CC: Tower, [name], MD

## 2019-01-01 NOTE — Telephone Encounter (Signed)
Pt returned your call, pls call her again.

## 2019-01-01 NOTE — Telephone Encounter (Signed)
FYI-  She plans to bring this form to her appointment. I will take care of it. I am hopeful she will have her part and any extra papers she may have to bring with her. Her appointment is today.

## 2019-01-01 NOTE — Telephone Encounter (Signed)
Ok ill let you know when she is here

## 2019-01-01 NOTE — Patient Instructions (Addendum)
Go to the basement for labs today  Eat small frequent meals  You have been scheduled for 2 Feraheme infusions The first infusion is scheduled on September 25th at [place] at [place] to patient care center     If you are age 68 or older, your body mass index should be between 23-30. Your Body mass index is 17.34 kg/m. If this is out of the aforementioned range listed, please consider follow up with your Primary Care Provider.  If you are age 12 or younger, your body mass index should be between 19-25. Your Body mass index is 17.34 kg/m. If this is out of the aformentioned range listed, please consider follow up with your Primary Care Provider.    I appreciate the  opportunity to care for you  Thank You   Harl Bowie , MD

## 2019-01-02 LAB — COMPREHENSIVE METABOLIC PANEL
ALT: 14 U/L (ref 0–35)
AST: 18 U/L (ref 0–37)
Albumin: 3.9 g/dL (ref 3.5–5.2)
Alkaline Phosphatase: 96 U/L (ref 39–117)
BUN: 19 mg/dL (ref 6–23)
CO2: 25 mEq/L (ref 19–32)
Calcium: 9.9 mg/dL (ref 8.4–10.5)
Chloride: 102 mEq/L (ref 96–112)
Creatinine, Ser: 0.99 mg/dL (ref 0.40–1.20)
GFR: 55.77 mL/min — ABNORMAL LOW (ref >60.00)
Glucose, Bld: 370 mg/dL — ABNORMAL HIGH (ref 70–99)
Potassium: 4.5 mEq/L (ref 3.5–5.1)
Sodium: 135 mEq/L (ref 135–145)
Total Bilirubin: 0.7 mg/dL (ref 0.2–1.2)
Total Protein: 7.5 g/dL (ref 6.0–8.3)

## 2019-01-02 LAB — IBC + FERRITIN
Ferritin: 7.7 ng/mL — ABNORMAL LOW (ref 10.0–291.0)
Iron: 26 ug/dL — ABNORMAL LOW (ref 42–145)
Saturation Ratios: 4.6 % — ABNORMAL LOW (ref 20.0–50.0)
Transferrin: 400 mg/dL — ABNORMAL HIGH (ref 212.0–360.0)

## 2019-01-02 NOTE — Telephone Encounter (Signed)
Confirmed her income and the number of people in her household. Patient assistance forms for Xifaxan faxed.

## 2019-01-03 NOTE — Telephone Encounter (Signed)
Best number [phone]  Pt called back with additional information  CT of chest with and without contrast CPT Code 69437 with contrast CPT code [phone] with out contrast

## 2019-01-03 NOTE — Telephone Encounter (Signed)
Labs have not been released to me. Must be reviewed by the provider first.

## 2019-01-03 NOTE — Telephone Encounter (Signed)
Pt returning call to [facility].

## 2019-01-03 NOTE — Telephone Encounter (Signed)
Pt called back with update about CA screening and ins co approval and pt spoke with the lady that is her customer rep where she bought the ins that her ins does approve screening for lung CA; pt said she has contacted [facility] (not [facility]) to get a specific billing code to see what testing they normally do for lung CA. After pt gets info from [facility] she will contact the ins co (not the customer rep) and verify what testing is approved; pt will then cb to let Dr Glori Bickers be aware of what testing she needs. Also on 01/02/19 pt was stung on lt ankle 3 x a fire ants. On 01/02/19 the area was sore and swollen. Today the swelling has gone and the places are not as sore as yesterday;no itching. The area stung is red and there are some pustules also. Pt cannot schedule appt for today and will cb on 01/04/19 if pt needs to be seen. Pt wanted Dr Glori Bickers to be aware of fire ant stings. FYI to Dr Glori Bickers.

## 2019-01-03 NOTE — Telephone Encounter (Signed)
Aware, thanks  Keep ant bite areas clean (soap and water) cool and dry  otc cortisone cream should help

## 2019-01-03 NOTE — Telephone Encounter (Signed)
Where does she want me to order these- ? [facility] or [facility] or here  Thanks

## 2019-01-03 NOTE — Telephone Encounter (Signed)
Pt notified of Dr. Marliss Coots comments and verbalized understanding. Pt said area looks a lot better and she is using cortisone cream

## 2019-01-03 NOTE — Telephone Encounter (Signed)
Pt requested a call back regarding lab results.

## 2019-01-04 ENCOUNTER — Telehealth: Payer: Self-pay | Admitting: *Deleted

## 2019-01-04 ENCOUNTER — Encounter: Payer: Self-pay | Admitting: Family Medicine

## 2019-01-04 DIAGNOSIS — Z122 Encounter for screening for malignant neoplasm of respiratory organs: Secondary | ICD-10-CM | POA: Insufficient documentation

## 2019-01-04 MED ORDER — METFORMIN HCL ER 500 MG PO TB24: 500.0000 mg | ORAL_TABLET | Freq: Every day | ORAL | 3 refills | Status: DC

## 2019-01-04 NOTE — Telephone Encounter (Signed)
-----   Message from [name], MD sent at 01/03/2019  9:00 PM EDT ----- Please let pt know I reviewed her labs from GI and glucose was extremely high at 370  We should go ahead and start her on medication (metformin) before her next visit for diabetes Is she open to that ?

## 2019-01-04 NOTE — Addendum Note (Signed)
Addended by: Loura Pardon A on: 01/04/2019 10:45 AM   Modules accepted: Orders

## 2019-01-04 NOTE — Telephone Encounter (Signed)
Referral done for CT of chest with and without contrast  Pt wants at [place] This was recommended to her by a [facility] provider She also talked to her insurance customer rep about this  Sending to [facility]

## 2019-01-04 NOTE — Telephone Encounter (Signed)
Pt notified of Dr. Marliss Coots comments. Pt is okay with starting med. Pt did say last DM med she was on she had a bad reaction and can not take that one again. She said as long as metformin isn't last DM she tried then she is okay with Dr. Glori Bickers sending in med. [facility]

## 2019-01-04 NOTE — Telephone Encounter (Signed)
Left VM requesting pt to call the office back 

## 2019-01-04 NOTE — Telephone Encounter (Signed)
We will try a low dose of metformin xr to start  We can increase dose later if it goes well Please stick to a diabetic diet [facility]  If any problems please let me know

## 2019-01-04 NOTE — Telephone Encounter (Signed)
Pt said info came from [place]. She would like you to order it through their screening program she said insurance said they cover it

## 2019-01-04 NOTE — Telephone Encounter (Signed)
Pt notified of Dr. Marliss Coots comments and verbalized understanding she will start med

## 2019-01-05 ENCOUNTER — Encounter (HOSPITAL_COMMUNITY): Payer: Medicare HMO

## 2019-01-05 ENCOUNTER — Telehealth: Payer: Self-pay | Admitting: Family Medicine

## 2019-01-05 DIAGNOSIS — F17200 Nicotine dependence, unspecified, uncomplicated: Secondary | ICD-10-CM

## 2019-01-05 NOTE — Telephone Encounter (Signed)
Spoke with patient and explained that [facility] has a Lung Ca Screening program that we can refer her to that will be in her [place]. She would like to be referred to this program instead of going to [facility] as they would be out of network. Please place AMB Ref to Lung Ca Screening program referral and choose [facility] as the location. Burgess Estelle R.N.  Will call her to set up the shared decision making appointment to see if she qualifies. Please cancel the CT scan orders you placed. I can cancel on my WQ but I dont know if it cancels the orders in the patients chart.

## 2019-01-06 NOTE — Telephone Encounter (Signed)
Referral done Thanks Rosaria Ferries

## 2019-01-08 ENCOUNTER — Other Ambulatory Visit: Payer: Self-pay

## 2019-01-08 ENCOUNTER — Ambulatory Visit (HOSPITAL_COMMUNITY)
Admission: RE | Admit: 2019-01-08 | Discharge: 2019-01-08 | Disposition: A | Discharge status: Home / Self Care | Payer: Medicare HMO | Source: Ambulatory Visit | Attending: Gastroenterology | Admitting: Gastroenterology

## 2019-01-08 DIAGNOSIS — D508 Other iron deficiency anemias: Secondary | ICD-10-CM | POA: Diagnosis not present

## 2019-01-08 MED ORDER — SODIUM CHLORIDE 0.9 % IV SOLN
510.0000 mg | INTRAVENOUS | Status: DC
  Administered 2019-01-08: 510 mg via INTRAVENOUS
  Filled 2019-01-08: qty 510

## 2019-01-08 NOTE — Discharge Instructions (Signed)

## 2019-01-10 ENCOUNTER — Telehealth: Payer: Self-pay | Admitting: *Deleted

## 2019-01-10 DIAGNOSIS — Z122 Encounter for screening for malignant neoplasm of respiratory organs: Secondary | ICD-10-CM

## 2019-01-10 DIAGNOSIS — Z87891 Personal history of nicotine dependence: Secondary | ICD-10-CM

## 2019-01-10 NOTE — Telephone Encounter (Signed)
Received referral for initial lung cancer screening scan. Contacted patient and obtained smoking history,(current, 53 pack year) as well as answering questions related to screening process. Patient denies signs of lung cancer such as weight loss or hemoptysis. Patient denies comorbidity that would prevent curative treatment if lung cancer were found. Patient is scheduled for shared decision making visit and CT scan on 01/18/19 at 130pm.

## 2019-01-11 ENCOUNTER — Telehealth: Payer: Self-pay | Admitting: Gastroenterology

## 2019-01-11 ENCOUNTER — Telehealth: Payer: Self-pay | Admitting: Family Medicine

## 2019-01-11 NOTE — Telephone Encounter (Signed)
Pt inquired whether she needs blood test prior to appointment scheduled 02/12/19.  She thinks that it is "strange to go a whole month without blood works."

## 2019-01-11 NOTE — Telephone Encounter (Signed)
Labs are stable with no acute changes.  She does not need additional labs prior to the appointment.

## 2019-01-11 NOTE — Telephone Encounter (Signed)
error 

## 2019-01-11 NOTE — Telephone Encounter (Signed)
Did the 01/01/19 labs show up in your box?

## 2019-01-11 NOTE — Telephone Encounter (Signed)
Patient is advised.  

## 2019-01-12 ENCOUNTER — Other Ambulatory Visit (HOSPITAL_COMMUNITY): Payer: Self-pay | Admitting: *Deleted

## 2019-01-15 ENCOUNTER — Encounter (HOSPITAL_COMMUNITY): Payer: Medicare HMO

## 2019-01-15 ENCOUNTER — Ambulatory Visit (HOSPITAL_COMMUNITY)
Admission: RE | Admit: 2019-01-15 | Discharge: 2019-01-15 | Disposition: A | Discharge status: Home / Self Care | Payer: Medicare HMO | Source: Ambulatory Visit | Attending: Gastroenterology | Admitting: Gastroenterology

## 2019-01-15 ENCOUNTER — Other Ambulatory Visit: Payer: Self-pay

## 2019-01-15 DIAGNOSIS — D508 Other iron deficiency anemias: Secondary | ICD-10-CM | POA: Insufficient documentation

## 2019-01-15 DIAGNOSIS — M542 Cervicalgia: Secondary | ICD-10-CM | POA: Diagnosis not present

## 2019-01-15 DIAGNOSIS — M545 Low back pain: Secondary | ICD-10-CM | POA: Diagnosis not present

## 2019-01-15 MED ORDER — SODIUM CHLORIDE 0.9 % IV SOLN
510.0000 mg | Freq: Once | INTRAVENOUS | Status: AC
  Administered 2019-01-15: 510 mg via INTRAVENOUS
  Filled 2019-01-15: qty 510

## 2019-01-17 ENCOUNTER — Telehealth: Payer: Self-pay | Admitting: Gastroenterology

## 2019-01-17 ENCOUNTER — Encounter: Payer: Self-pay | Admitting: Family Medicine

## 2019-01-17 DIAGNOSIS — M545 Low back pain: Secondary | ICD-10-CM | POA: Diagnosis not present

## 2019-01-17 NOTE — Telephone Encounter (Signed)
She needs to stay on Lactulose with goal 2-3 soft BM to prevent hepatic encephalopathy. Once she starts Xifaxan, we can consider lowering the dose of Lactulose.

## 2019-01-17 NOTE — Telephone Encounter (Signed)
Patient has been taking Lactulose 2 tbsp daily. Past 2 days she has had Diarrhea" stools x2 Upon further questioning she is experiencing very soft stools but not watery stools.  We are resubmitting the patient assistance forms for Xifaxan. Does she need to change her dosage of Lactulose.

## 2019-01-17 NOTE — Telephone Encounter (Signed)
Patient advised. I am submitting different paperwork to try again to get patient medication assistance for the patient on the Xifaxan.

## 2019-01-18 ENCOUNTER — Ambulatory Visit
Admission: RE | Admit: 2019-01-18 | Discharge: 2019-01-18 | Disposition: A | Discharge status: Home / Self Care | Payer: Medicare HMO | Source: Ambulatory Visit | Attending: Nurse Practitioner | Admitting: Nurse Practitioner

## 2019-01-18 ENCOUNTER — Other Ambulatory Visit: Payer: Self-pay

## 2019-01-18 ENCOUNTER — Inpatient Hospital Stay: Payer: Medicare HMO | Attending: Nurse Practitioner | Admitting: Oncology

## 2019-01-18 ENCOUNTER — Other Ambulatory Visit: Payer: Self-pay | Admitting: Rehabilitation

## 2019-01-18 DIAGNOSIS — Z122 Encounter for screening for malignant neoplasm of respiratory organs: Secondary | ICD-10-CM | POA: Insufficient documentation

## 2019-01-18 DIAGNOSIS — M545 Low back pain, unspecified: Secondary | ICD-10-CM

## 2019-01-18 DIAGNOSIS — F17210 Nicotine dependence, cigarettes, uncomplicated: Secondary | ICD-10-CM | POA: Diagnosis not present

## 2019-01-18 DIAGNOSIS — Z87891 Personal history of nicotine dependence: Secondary | ICD-10-CM | POA: Diagnosis not present

## 2019-01-18 NOTE — Progress Notes (Signed)
Virtual Visit via Video Note  I connected with [name] on 01/18/19 at  1:30 PM EDT by a video enabled telemedicine application and verified that I am speaking with the correct person using two identifiers.  Location: Patient: [facility] Provider: Office   I discussed the limitations of evaluation and management by telemedicine and the availability of in person appointments. The patient expressed understanding and agreed to proceed.  I discussed the assessment and treatment plan with the patient. The patient was provided an opportunity to ask questions and all were answered. The patient agreed with the plan and demonstrated an understanding of the instructions.   The patient was advised to call back or seek an in-person evaluation if the symptoms worsen or if the condition fails to improve as anticipated.   In accordance with CMS guidelines, patient has met eligibility criteria including age, absence of signs or symptoms of lung cancer.  Social History   Tobacco Use  . Smoking status: Current Every Day Smoker    Packs/day: 1.00    Years: 53.00    Pack years: 53.00    Types: Cigarettes  . Smokeless tobacco: Never Used  . Tobacco comment: tobacco info given 04/27/2018  Substance Use Topics  . Alcohol use: Not Currently    Comment: occasional use  . Drug use: No      A shared decision-making session was conducted prior to the performance of CT scan. This includes one or more decision aids, includes benefits and harms of screening, follow-up diagnostic testing, over-diagnosis, false positive rate, and total radiation exposure.   Counseling on the importance of adherence to annual lung cancer LDCT screening, impact of co-morbidities, and ability or willingness to undergo diagnosis and treatment is imperative for compliance of the program.   Counseling on the importance of continued smoking cessation for former smokers; the importance of smoking cessation for current smokers, and  information about tobacco cessation interventions have been given to patient including [place] and 1800 quit [place] programs.   Written order for lung cancer screening with LDCT has been given to the patient and any and all questions have been answered to the best of my abilities.    Yearly follow up will be coordinated by Burgess Estelle, Thoracic Navigator.  I provided 15 minutes of face-to-face video visit time during this encounter, and > 50% was spent counseling as documented under my assessment & plan.   Jacquelin Hawking, NP

## 2019-01-19 ENCOUNTER — Encounter (HOSPITAL_COMMUNITY): Payer: Self-pay | Admitting: *Deleted

## 2019-01-19 ENCOUNTER — Telehealth: Payer: Self-pay

## 2019-01-19 ENCOUNTER — Other Ambulatory Visit: Payer: Self-pay

## 2019-01-19 ENCOUNTER — Other Ambulatory Visit: Payer: Self-pay | Admitting: Gastroenterology

## 2019-01-19 ENCOUNTER — Emergency Department (HOSPITAL_COMMUNITY)
Admission: EM | Admit: 2019-01-19 | Discharge: 2019-01-20 | Discharge status: Against Medical Advice | Payer: Medicare HMO | Attending: Emergency Medicine | Admitting: Emergency Medicine

## 2019-01-19 DIAGNOSIS — R739 Hyperglycemia, unspecified: Secondary | ICD-10-CM | POA: Diagnosis not present

## 2019-01-19 DIAGNOSIS — Z853 Personal history of malignant neoplasm of breast: Secondary | ICD-10-CM | POA: Diagnosis not present

## 2019-01-19 DIAGNOSIS — K921 Melena: Secondary | ICD-10-CM | POA: Diagnosis not present

## 2019-01-19 DIAGNOSIS — R195 Other fecal abnormalities: Secondary | ICD-10-CM | POA: Diagnosis not present

## 2019-01-19 DIAGNOSIS — F17210 Nicotine dependence, cigarettes, uncomplicated: Secondary | ICD-10-CM | POA: Insufficient documentation

## 2019-01-19 DIAGNOSIS — Z79899 Other long term (current) drug therapy: Secondary | ICD-10-CM | POA: Insufficient documentation

## 2019-01-19 LAB — COMPREHENSIVE METABOLIC PANEL
ALT: 16 U/L (ref 0–44)
AST: 27 U/L (ref 15–41)
Albumin: 3.5 g/dL (ref 3.5–5.0)
Alkaline Phosphatase: 79 U/L (ref 38–126)
Anion gap: 11 (ref 5–15)
BUN: 15 mg/dL (ref 8–23)
CO2: 22 mmol/L (ref 22–32)
Calcium: 9.5 mg/dL (ref 8.9–10.3)
Chloride: 98 mmol/L (ref 98–111)
Creatinine, Ser: 1 mg/dL (ref 0.44–1.00)
GFR calc Af Amer: >60 mL/min (ref >60)
GFR calc non Af Amer: 58 mL/min — ABNORMAL LOW (ref >60)
Glucose, Bld: 223 mg/dL — ABNORMAL HIGH (ref 70–99)
Potassium: 4.3 mmol/L (ref 3.5–5.1)
Sodium: 131 mmol/L — ABNORMAL LOW (ref 135–145)
Total Bilirubin: 0.9 mg/dL (ref 0.3–1.2)
Total Protein: 6.8 g/dL (ref 6.5–8.1)

## 2019-01-19 LAB — CBC
HCT: 33.9 % — ABNORMAL LOW (ref 36.0–46.0)
Hemoglobin: 10.4 g/dL — ABNORMAL LOW (ref 12.0–15.0)
MCH: 27.2 pg (ref 26.0–34.0)
MCHC: 30.7 g/dL (ref 30.0–36.0)
MCV: 88.5 fL (ref 80.0–100.0)
Platelets: UNDETERMINED 10*3/uL (ref 150–400)
RBC: 3.83 MIL/uL — ABNORMAL LOW (ref 3.87–5.11)
RDW: 24.4 % — ABNORMAL HIGH (ref 11.5–15.5)
WBC: 9.6 10*3/uL (ref 4.0–10.5)
nRBC: 0 % (ref 0.0–0.2)

## 2019-01-19 LAB — TYPE AND SCREEN
ABO/RH(D): O POS
Antibody Screen: NEGATIVE

## 2019-01-19 LAB — ABO/RH: ABO/RH(D): O POS

## 2019-01-19 NOTE — Telephone Encounter (Signed)
Spoke with Rosaria Ferries. She is reporting "very dark stool" and states, "not brown, but darker than when I was on that iron." Denies diet changes, use of Pepto-Bismol, not new medication. She denies nausea. States her stools are loose as the usual on Chronulac. Please advise. Still waiting on the patient assistance for Xifaxan.

## 2019-01-19 NOTE — Telephone Encounter (Signed)
Pt requested an update--she stated that her stool has turned dark.

## 2019-01-19 NOTE — Telephone Encounter (Signed)
Patient contacted. Advised to go to the ED. Instructed to tell the ED staff she has cirrhosis and black stool. Her doctor is concerned she is bleeding and told her to go the ED. She said she would go to the ER as instructed. She "just got groceries" and needs to put them up.

## 2019-01-19 NOTE — Telephone Encounter (Signed)
She will need to come to ER if having melena for further assessment. Please advise her to come to ER

## 2019-01-19 NOTE — ED Triage Notes (Signed)
PT sent here by Dr Peggye Ley for dark stools.  Pt denies any increased fatigue or abdominal pain.

## 2019-01-20 LAB — POC OCCULT BLOOD, ED: Fecal Occult Bld: POSITIVE — AB

## 2019-01-20 NOTE — ED Notes (Signed)
Dr. Leonette Monarch called pt and discovered she was waiting for her daughter in the waiting room. Took d/c papers out to pt and d/w pt d/c instructions, medications, follow up and return precautions.  Pt verbalized understanding of all of the above.  Pt ambulatory and denies pain.

## 2019-01-20 NOTE — ED Notes (Signed)
Pt is no longer in her room.  Monitoring equipment is on the bed.  Will d/c AMA.

## 2019-01-20 NOTE — ED Provider Notes (Signed)
[facility] EMERGENCY DEPARTMENT Provider Note  CSN: [id number] Arrival date & time: 01/19/19 1725  Chief Complaint(s) Blood In Stools  HPI Christine Barton is a 68 y.o. female with a past medical history listed below including alcohol cirrhosis, diverticulosis who presents to the emergency department for dark stools noted today.  Patient called gastroenterology on-call physician who recommended she present to the emergency department.  Patient reports that she has loose stools due to medication that she is taking, appears to be lactulose.  Noted the dark stools today.  She reports to me that they are dark brown but reported to the on-call physician that they were not brown.  No current abdominal pain.  No fevers or chills.  No nausea or vomiting.  No other physical complaints.  HPI  Past Medical History Past Medical History:  Diagnosis Date  . Alcohol abuse, unspecified   . Breast cancer ([place]) 1998   Right  . Cataract    left eye  . Cervical spondylosis 2006   MRI  . Degeneration of cervical intervertebral disc 2006   MRI  . Diverticulosis of colon (without mention of hemorrhage)   . Hyperpotassemia   . Microscopic hematuria   . Mononeuritis of unspecified site   . Nonspecific abnormal results of liver function study   . Other abnormal glucose   . Other and unspecified hyperlipidemia    no per pt  . Other chronic nonalcoholic liver disease   . Personal history of chemotherapy   . Personal history of malignant neoplasm of breast   . Personal history of radiation therapy   . Pneumothorax, acute    right, spontaneous  . Tobacco use disorder   . Unspecified vitamin D deficiency    Patient Active Problem List   Diagnosis Date Noted  . Encounter for screening for lung cancer 01/04/2019  . Low back pain 12/07/2018  . Venous insufficiency 11/15/2018  . Hyponatremia 11/07/2018  . Elevated serum creatinine 11/07/2018  . Esophageal varices without bleeding  ([place]) 10/27/2018  . Anemia 10/23/2018  . Fatigue 10/23/2018  . Poor balance 05/30/2018  . Falls 05/30/2018  . Generalized weakness 05/30/2018  . Cirrhosis of liver ([place]) 04/24/2018  . Ascites 04/24/2018  . Gallstones 04/24/2018  . Heme positive stool 04/24/2018  . Screening mammogram, encounter for 11/07/2017  . Smoker 11/07/2017  . Screening examination for STD (sexually transmitted disease) 11/11/2016  . Welcome to Medicare preventive visit 10/26/2016  . Estrogen deficiency 10/26/2016  . Colon cancer screening 11/01/2014  . Elevated liver enzymes 11/01/2014  . Encounter for routine gynecological examination 09/12/2013  . Rapid heart beat 10/10/2012  . Heartburn 08/20/2011  . Routine general medical examination at a health care facility 07/21/2011  . Vitamin D deficiency 08/04/2009  . POSTMENOPAUSAL STATUS 08/04/2009  . Controlled diabetes mellitus type 2 with complications ([place]) 37/34/2876  . Hyperlipidemia 06/14/2008  . [place] DISEASE, CERVICAL 03/30/2007  . History of alcohol abuse 12/06/2006  . Neuropathy of both feet 12/06/2006  . DIVERTICULOSIS, COLON 12/06/2006  . Fatty liver 12/06/2006  . BREAST CANCER, HX OF 12/06/2006   Home Medication(s) Prior to Admission medications   Medication Sig Start Date End Date Taking? Authorizing Provider  acetaminophen (TYLENOL) 500 MG tablet Take 500 mg by mouth 2 (two) times a day. Uses only as needed    [provider]  albuterol (VENTOLIN HFA) 108 (90 Base) MCG/ACT inhaler Inhale 2 puffs into the lungs every 4 (four) hours as needed for wheezing or shortness  of breath. 11/20/18   Tower, [name], MD  doxylamine, Sleep, (UNISOM) 25 MG tablet Take 25 mg by mouth at bedtime as needed for sleep.     [provider]  furosemide (LASIX) 20 MG tablet Take 20 mg by mouth daily.  10/14/18   [provider]  lactulose (CHRONULAC) 10 GM/15ML solution Take 15 mLs (10 g total) by mouth 3 (three) times daily. Patient taking  differently: Take 20 g by mouth See admin instructions. 2 tablespoons daily 06/28/18   Mauri Pole, MD  metFORMIN (GLUCOPHAGE XR) 500 MG 24 hr tablet Take 1 tablet (500 mg total) by mouth daily with breakfast. 01/04/19   Tower, [name], MD  nadolol (CORGARD) 20 MG tablet Take 1 tablet (20 mg total) by mouth daily. 10/30/18 10/30/19  Mauri Pole, MD  pantoprazole (PROTONIX) 40 MG tablet Take 1 tablet (40 mg total) by mouth daily. 05/16/18   Mauri Pole, MD  Polyethyl Glycol-Propyl Glycol (SYSTANE OP) Place 1 drop into the left eye daily as needed (dryness).    [provider]  rifaximin (XIFAXAN) 550 MG TABS tablet Take 1 tablet (550 mg total) by mouth 2 (two) times daily. 01/01/19   Mauri Pole, MD  spironolactone (ALDACTONE) 25 MG tablet Take 25 mg by mouth daily.    [provider]  spironolactone (ALDACTONE) 50 MG tablet TAKE 1 TABLET BY MOUTH TWICE A DAY 01/19/19   Mauri Pole, MD                                                                                                                                    Past Surgical History Past Surgical History:  Procedure Laterality Date  . BREAST BIOPSY  9/03   Right  . BREAST LUMPECTOMY Right 1998  . BREAST LUMPECTOMY  1998  . CHEST TUBE INSERTION  11/02/2014  . COLONOSCOPY    . ESOPHAGOGASTRODUODENOSCOPY (EGD) WITH PROPOFOL N/A 10/30/2018   Procedure: ESOPHAGOGASTRODUODENOSCOPY (EGD) WITH PROPOFOL;  Surgeon: Mauri Pole, MD;  Location: [facility];  Service: Endoscopy;  Laterality: N/A;  . EYE SURGERY  02/2017   cataract extraction with lens implant  . TUBAL LIGATION     Family History Family History  Problem Relation Age of Onset  . Heart failure Father   . Colon cancer Maternal Uncle   . Stroke Mother   . Esophageal cancer Neg Hx   . Rectal cancer Neg Hx   . Stomach cancer Neg Hx     Social History Social History   Tobacco Use  . Smoking status: Current Every Day Smoker     Packs/day: 1.00    Years: 53.00    Pack years: 53.00    Types: Cigarettes  . Smokeless tobacco: Never Used  . Tobacco comment: tobacco info given 04/27/2018  Substance Use Topics  . Alcohol use: Not Currently    Comment: occasional use  .  Drug use: No   Allergies Oxycodone and Glipizide  Review of Systems Review of Systems All other systems are reviewed and are negative for acute change except as noted in the HPI  Physical Exam Vital Signs  I have reviewed the triage vital signs BP (!) 110/96 (BP Location: Right Arm)   Pulse 68   Temp 98.1 F (36.7 C) (Oral)   Resp 19   Ht 5' 4.5" (1.638 m)   Wt 46.7 kg   SpO2 100%   BMI 17.41 kg/m   Physical Exam Vitals signs reviewed.  Constitutional:      General: She is not in acute distress.    Appearance: She is well-developed. She is not diaphoretic.  HENT:     Head: Normocephalic and atraumatic.     Right Ear: External ear normal.     Left Ear: External ear normal.     Nose: Nose normal.  Eyes:     General: No scleral icterus.    Conjunctiva/sclera: Conjunctivae normal.  Neck:     Musculoskeletal: Normal range of motion.     Trachea: Phonation normal.  Cardiovascular:     Rate and Rhythm: Normal rate and regular rhythm.  Pulmonary:     Effort: Pulmonary effort is normal. No respiratory distress.     Breath sounds: No stridor.  Abdominal:     General: There is no distension.     Tenderness: There is no abdominal tenderness.  Genitourinary:    Rectum: Guaiac result positive.     Comments: Light brown stool Musculoskeletal: Normal range of motion.  Neurological:     Mental Status: She is alert and oriented to person, place, and time.  Psychiatric:        Behavior: Behavior normal.     ED Results and Treatments Labs (all labs ordered are listed, but only abnormal results are displayed) Labs Reviewed  COMPREHENSIVE METABOLIC PANEL - Abnormal; Notable for the following components:      Result Value   Sodium  131 (*)    Glucose, Bld 223 (*)    GFR calc non Af Amer 58 (*)    All other components within normal limits  CBC - Abnormal; Notable for the following components:   RBC 3.83 (*)    Hemoglobin 10.4 (*)    HCT 33.9 (*)    RDW 24.4 (*)    All other components within normal limits  POC OCCULT BLOOD, ED - Abnormal; Notable for the following components:   Fecal Occult Bld POSITIVE (*)    All other components within normal limits  TYPE AND SCREEN  ABO/RH                                                                                                                         EKG  EKG Interpretation  Date/Time:    Ventricular Rate:    PR Interval:    QRS Duration:   QT Interval:    QTC Calculation:   R  Axis:     Text Interpretation:        Radiology No results found.  Pertinent labs & imaging results that were available during my care of the patient were reviewed by me and considered in my medical decision making (see chart for details).  Medications Ordered in ED Medications - No data to display                                                                                                                                  Procedures Procedures  (including critical care time)  Medical Decision Making / ED Course I have reviewed the nursing notes for this encounter and the patient's prior records (if available in EHR or on provided paperwork).   Christine Barton was evaluated in Emergency Department on 01/20/2019 for the symptoms described in the history of present illness. She was evaluated in the context of the global COVID-19 pandemic, which necessitated consideration that the patient might be at risk for infection with the SARS-CoV-2 virus that causes COVID-19. Institutional protocols and algorithms that pertain to the evaluation of patients at risk for COVID-19 are in a state of rapid change based on information released by regulatory bodies including the CDC and federal and  state organizations. These policies and algorithms were followed during the patient's care in the ED.  Patient presents with concern for melena.  On exam she has light brown stool which is Hemoccult positive.  Hemoglobin is stable.  BUN within normal limits.  Hyperglycemia without DKA.  Rest of the work-up reassuring.  Discussed case with Dr. Lyndel Safe from lumbar GI who felt patient will be stable to follow-up in clinic.  Recommended increase Protonix to 40 mg twice daily and reframe NSAID use.  Prior to informing the patient of the recommendations, she eloped.  I called the patient and she was in the waiting room awaiting to be picked up by her daughter.  She was updated and discharge paperwork taken to her.  The patient appears reasonably screened and/or stabilized for discharge and I doubt any other medical condition or other [facility] requiring further screening, evaluation, or treatment in the ED at this time prior to discharge.  The patient is safe for discharge with strict return precautions.       Final Clinical Impression(s) / ED Diagnoses Final diagnoses:  Blood in stool    The patient appears reasonably screened and/or stabilized for discharge and I doubt any other medical condition or other [facility] requiring further screening, evaluation, or treatment in the ED at this time prior to discharge.  Disposition: Discharge  Condition: Good  I have discussed the results, Dx and Tx plan with the patient who expressed understanding and agree(s) with the plan. Discharge instructions discussed at great length. The patient was given strict return precautions who verbalized understanding of the instructions. No further questions at time of discharge.    ED Discharge Orders  None       Follow Up: [facility] [place] [address] [phone] Schedule an appointment as soon as possible for a visit        This chart was dictated using  voice recognition software.  Despite best efforts to proofread,  errors can occur which can change the documentation meaning.   Fatima Blank, MD 01/20/19 [phone]

## 2019-01-20 NOTE — Discharge Instructions (Addendum)
Increase your Protonix to 40 mg twice a day.  Avoid NSAID use.  Please follow-up with your gastroenterologist

## 2019-01-21 NOTE — Progress Notes (Signed)
Some small pulmonary nodules that can be re checked in 12 months Copd as expected  Some atherosclerosis in aorta and also coronary arteries  Try to eat a low cholesterol diet : Avoid red meat/ fried foods/ egg yolks/ fatty breakfast meats/ butter, cheese and high fat dairy/ and shellfish   Also keep thinking about quitting smoking  I'm interested in sending her for a cardiology visit to check in about the coronary findings Would she be open to that ?

## 2019-01-22 ENCOUNTER — Telehealth: Payer: Self-pay | Admitting: *Deleted

## 2019-01-22 ENCOUNTER — Telehealth: Payer: Self-pay | Admitting: Gastroenterology

## 2019-01-22 DIAGNOSIS — I2510 Atherosclerotic heart disease of native coronary artery without angina pectoris: Secondary | ICD-10-CM | POA: Insufficient documentation

## 2019-01-22 DIAGNOSIS — I700 Atherosclerosis of aorta: Secondary | ICD-10-CM | POA: Insufficient documentation

## 2019-01-22 NOTE — Telephone Encounter (Signed)
Called pt and advise her of Dr. Marliss Coots comments regarding her Lung Cancer Screening results. Pt agrees with referral to cardiologist. Please put referral in and I advise pt our [facility] will call to schedule appt

## 2019-01-22 NOTE — Telephone Encounter (Signed)
Pt was advised to go to ED for blood in stool.  Pt would like to know plan of care.

## 2019-01-22 NOTE — Telephone Encounter (Signed)
Referral done  Will send to [facility]

## 2019-01-22 NOTE — Telephone Encounter (Signed)
Left message for pt to call back  °

## 2019-01-22 NOTE — Telephone Encounter (Addendum)
-----   Message from [name], MD sent at 01/21/2019 12:07 PM EDT -----   ----- Message ----- From: Dimple Casey, RT Sent: 01/18/2019   1:48 PM EDT To: [name], MD  Some small pulmonary nodules that can be re checked in 12 months Copd as expected  Some atherosclerosis in aorta and also coronary arteries  Try to eat a low cholesterol diet : Avoid red meat/ fried foods/ egg yolks/ fatty breakfast meats/ butter, cheese and high fat dairy/ and shellfish   Also keep thinking about quitting smoking  I'm interested in sending her for a cardiology visit to check in about the coronary findings Would she be open to that ?

## 2019-01-22 NOTE — Telephone Encounter (Signed)
Pt states she was in the ER Friday for 11 hours and told she was stable and to follow-up with GI Monday morning. Pt states her stools are still dark. She does not feel weak, has not had any SOB or dizziness. Pt wants to know what Dr. [name] recommends. Please advise.

## 2019-01-22 NOTE — Telephone Encounter (Signed)
Pt returned your call, pls call her again.

## 2019-01-22 NOTE — Telephone Encounter (Signed)
See additional phone note.

## 2019-01-23 ENCOUNTER — Other Ambulatory Visit: Payer: Self-pay

## 2019-01-23 ENCOUNTER — Encounter: Payer: Self-pay | Admitting: *Deleted

## 2019-01-23 DIAGNOSIS — D508 Other iron deficiency anemias: Secondary | ICD-10-CM

## 2019-01-23 NOTE — Telephone Encounter (Signed)
Pt scheduled to see Ellouise Newer PA 01/26/19@10 :50am. Pt asked to go to the lab prior to her appt to have lab drawn, order in epic.

## 2019-01-23 NOTE — Telephone Encounter (Signed)
Please bring her for an urgent visit with APP this week. Will plan to recheck labs when she come in for appointment

## 2019-01-24 ENCOUNTER — Other Ambulatory Visit (INDEPENDENT_AMBULATORY_CARE_PROVIDER_SITE_OTHER): Payer: Medicare HMO

## 2019-01-24 ENCOUNTER — Telehealth: Payer: Self-pay | Admitting: Gastroenterology

## 2019-01-24 DIAGNOSIS — D508 Other iron deficiency anemias: Secondary | ICD-10-CM

## 2019-01-24 DIAGNOSIS — M545 Low back pain: Secondary | ICD-10-CM | POA: Diagnosis not present

## 2019-01-24 LAB — CBC WITH DIFFERENTIAL/PLATELET
Basophils Absolute: 0.1 10*3/uL (ref 0.0–0.1)
Basophils Relative: 1.1 % (ref 0.0–3.0)
Eosinophils Absolute: 0.1 10*3/uL (ref 0.0–0.7)
Eosinophils Relative: 0.7 % (ref 0.0–5.0)
HCT: 34.2 % — ABNORMAL LOW (ref 36.0–46.0)
Hemoglobin: 11 g/dL — ABNORMAL LOW (ref 12.0–15.0)
Lymphocytes Relative: 22.8 % (ref 12.0–46.0)
Lymphs Abs: 1.9 10*3/uL (ref 0.7–4.0)
MCHC: 32.1 g/dL (ref 30.0–36.0)
MCV: 86.5 fl (ref 78.0–100.0)
Monocytes Absolute: 0.7 10*3/uL (ref 0.1–1.0)
Monocytes Relative: 8.7 % (ref 3.0–12.0)
Neutro Abs: 5.6 10*3/uL (ref 1.4–7.7)
Neutrophils Relative %: 66.7 % (ref 43.0–77.0)
Platelets: 122 10*3/uL — ABNORMAL LOW (ref 150.0–400.0)
RBC: 3.95 Mil/uL (ref 3.87–5.11)
RDW: 28.6 % — ABNORMAL HIGH (ref 11.5–15.5)
WBC: 8.3 10*3/uL (ref 4.0–10.5)

## 2019-01-25 NOTE — Telephone Encounter (Signed)
Patient has been declined for assistance for the Xifaxan. This is despite her income level.  A person with the drug company is trying to help with this because she cannot afford Xifaxan even with her Medicare Part D.

## 2019-01-25 NOTE — Telephone Encounter (Signed)
Left a message with  This information on her voicemail. Also instructed to continue the Lactulose.

## 2019-01-26 ENCOUNTER — Encounter: Payer: Self-pay | Admitting: Physician Assistant

## 2019-01-26 ENCOUNTER — Ambulatory Visit: Payer: Medicare HMO | Admitting: Physician Assistant

## 2019-01-26 VITALS — BP 124/54 | HR 68 | Temp 98.1°F | Ht 64.0 in | Wt 103.2 lb

## 2019-01-26 DIAGNOSIS — D649 Anemia, unspecified: Secondary | ICD-10-CM

## 2019-01-26 DIAGNOSIS — K7031 Alcoholic cirrhosis of liver with ascites: Secondary | ICD-10-CM

## 2019-01-26 DIAGNOSIS — K921 Melena: Secondary | ICD-10-CM | POA: Diagnosis not present

## 2019-01-26 DIAGNOSIS — M545 Low back pain: Secondary | ICD-10-CM | POA: Diagnosis not present

## 2019-01-26 NOTE — Patient Instructions (Addendum)
If you are age 68 or older, your body mass index should be between 23-30. Your Body mass index is 17.72 kg/m. If this is out of the aforementioned range listed, please consider follow up with your Primary Care Provider.  If you are age 61 or younger, your body mass index should be between 19-25. Your Body mass index is 17.72 kg/m. If this is out of the aformentioned range listed, please consider follow up with your Primary Care Provider.   You have been scheduled for an endoscopy. Please follow written instructions given to you at your visit today. If you use inhalers (even only as needed), please bring them with you on the day of your procedure.  Due to recent COVID-19 restrictions implemented by our local and state authorities and in an effort to keep both patients and staff as safe as possible, our hospital system now requires COVID-19 testing prior to any scheduled hospital procedure. Please go to our [facility] location drive thru testing site ([address], [place], [place] [address]) on 03-07-2019 at  11am,. There will be multiple testing areas, the first checkpoint being for pre-procedure/surgery testing. Get into the right (yellow) lane that leads to the PAT testing team. You will not be billed at the time of testing but may receive a bill later depending on your insurance. The approximate cost of the test is $100. You must agree to quarantine from the time of your testing until the procedure date on 03-12-2019 . This should include staying at home with ONLY the people you live with. Avoid take-out, grocery store shopping or leaving the house for any non-emergent reason. Failure to have your COVID-19 test done on the date and time you have been scheduled will result in cancellation of procedure. Please call our office at [phone] if you have any questions.   It was a pleasure to see you today!

## 2019-01-26 NOTE — Progress Notes (Signed)
Chief Complaint: Melena, lower abdominal pain, history of alcoholic cirrhosis  HPI:    Christine Barton is a 68 year old female with known alcoholic cirrhosis of the liver with ascites, known to Dr. Fenton Foy, who presents to clinic today with a complaint of melena and abdominal pain.     01/01/2019 patient had office visit with Dr. [name] for cirrhosis.  At that time describes she had significant stomach upset with oral iron tablets.  Also had trouble taking lactulose.  At that time she is continued on low-dose furosemide 20 mg daily Aldactone 25 mg daily.  She was scheduled for IV Feraheme x2.  She is awaiting approval for Xifaxan 550 mg twice daily.  Noted abdominal ultrasound January 2020- for any focal liver lesions.  AFP in August 2020 within normal limits.  EGD in February 2020 showed esophageal varices small grade.  EGD 10/30/2018 showed nonbleeding grade 2 esophageal varices, small hiatal hernia, portal hypertensive gastropathy and normal examined duodenum.    01/24/2019 CBC with a hemoglobin of 11, 01/19/2019 hemoglobin was 10.4.    Today, the patient presents clinic and tells me that she has been having melena for the past 2 weeks at least a couple times a day.  Tells me that when she stopped oral iron her stools returned to normal color but now they have gone back black and tarry.  Denies feeling any palpitations or dizziness.  Does tell me she had IV iron, the last treatment 2 weeks ago.  Tells me she always has abdominal cramping which she relates to the lactulose.  Currently she is only using it as needed.  Explains that she will have 1-2 bowel movements a day.  Explains that she is awaiting approval for Xifaxan and hoping to decrease her lactulose because it "gives me abdominal cramping".    Currently fluid is under control, no peripheral edema per patient or increased abdominal distention.  She remains on her Furosemide 20 mg daily and Spironolactone 25 mg daily.    Denies fever, chills,  weight loss, heartburn or reflux.     Past Medical History:  Diagnosis Date   Alcohol abuse, unspecified    Breast cancer ([place]) 1998   Right   Cataract    left eye   Cervical spondylosis 2006   MRI   Degeneration of cervical intervertebral disc 2006   MRI   Diverticulosis of colon (without mention of hemorrhage)    Hyperpotassemia    Microscopic hematuria    Mononeuritis of unspecified site    Nonspecific abnormal results of liver function study    Other abnormal glucose    Other and unspecified hyperlipidemia    no per pt   Other chronic nonalcoholic liver disease    Personal history of chemotherapy    Personal history of malignant neoplasm of breast    Personal history of radiation therapy    Pneumothorax, acute    right, spontaneous   Tobacco use disorder    Unspecified vitamin D deficiency     Past Surgical History:  Procedure Laterality Date   BREAST BIOPSY  9/03   Right   BREAST LUMPECTOMY Right 1998   BREAST LUMPECTOMY  1998   CHEST TUBE INSERTION  11/02/2014   COLONOSCOPY     ESOPHAGOGASTRODUODENOSCOPY (EGD) WITH PROPOFOL N/A 10/30/2018   Procedure: ESOPHAGOGASTRODUODENOSCOPY (EGD) WITH PROPOFOL;  Surgeon: Mauri Pole, MD;  Location: [facility];  Service: Endoscopy;  Laterality: N/A;   EYE SURGERY  02/2017   cataract extraction with lens  implant   TUBAL LIGATION      Current Outpatient Medications  Medication Sig Dispense Refill   acetaminophen (TYLENOL) 500 MG tablet Take 500 mg by mouth 2 (two) times a day. Uses only as needed     albuterol (VENTOLIN HFA) 108 (90 Base) MCG/ACT inhaler Inhale 2 puffs into the lungs every 4 (four) hours as needed for wheezing or shortness of breath. 18 g 3   doxylamine, Sleep, (UNISOM) 25 MG tablet Take 25 mg by mouth at bedtime as needed for sleep.      furosemide (LASIX) 20 MG tablet Take 20 mg by mouth daily.      lactulose (CHRONULAC) 10 GM/15ML solution Take 15 mLs (10 g total)  by mouth 3 (three) times daily. (Patient taking differently: Take 20 g by mouth See admin instructions. 2 tablespoons daily) 236 mL 11   metFORMIN (GLUCOPHAGE XR) 500 MG 24 hr tablet Take 1 tablet (500 mg total) by mouth daily with breakfast. 30 tablet 3   nadolol (CORGARD) 20 MG tablet Take 1 tablet (20 mg total) by mouth daily. 30 tablet 11   pantoprazole (PROTONIX) 40 MG tablet Take 1 tablet (40 mg total) by mouth daily. (Patient taking differently: Take 40 mg by mouth 2 (two) times daily. ) 90 tablet 3   Polyethyl Glycol-Propyl Glycol (SYSTANE OP) Place 1 drop into the left eye daily as needed (dryness).     spironolactone (ALDACTONE) 25 MG tablet Take 25 mg by mouth daily.     No current facility-administered medications for this visit.     Allergies as of 01/26/2019 - Review Complete 01/26/2019  Allergen Reaction Noted   Oxycodone Nausea And Vomiting 11/02/2014   Glipizide Other (See Comments) 09/12/2013    Family History  Problem Relation Age of Onset   Heart failure Father    Colon cancer Maternal Uncle    Stroke Mother    Esophageal cancer Neg Hx    Rectal cancer Neg Hx    Stomach cancer Neg Hx     Social History   Socioeconomic History   Marital status: Single    Spouse name: Not on file   Number of children: 1   Years of education: Not on file   Highest education level: Not on file  Occupational History   Occupation: retired    Fish farm manager: [employer]  Social Needs   Financial resource strain: Not hard at all   Food insecurity    Worry: Never true    Inability: Never true   Transportation needs    Medical: No    Non-medical: No  Tobacco Use   Smoking status: Current Every Day Smoker    Packs/day: 1.00    Years: 53.00    Pack years: 53.00    Types: Cigarettes   Smokeless tobacco: Never Used   Tobacco comment: tobacco info given 04/27/2018  Substance and Sexual Activity   Alcohol use: Not Currently    Comment: occasional use     Drug use: No   Sexual activity: Not Currently  Lifestyle   Physical activity    Days per week: 0 days    Minutes per session: 0 min   Stress: Only a little  Relationships   Social connections    Talks on phone: Not on file    Gets together: Not on file    Attends religious service: Not on file    Active member of club or organization: Not on file    Attends meetings of clubs  or organizations: Not on file    Relationship status: Not on file   Intimate partner violence    Fear of current or ex partner: No    Emotionally abused: No    Physically abused: No    Forced sexual activity: No  Other Topics Concern   Not on file  Social History Narrative   Divorced      1 child      Works at [employer]:    Constitutional: No weight loss, fever or chills Cardiovascular: No chest pain   Respiratory: No SOB  Gastrointestinal: See HPI and otherwise negative   Physical Exam:  Vital signs: BP (!) 124/54 (BP Location: Left Arm, Patient Position: Sitting, Cuff Size: Normal)    Pulse 68    Temp 98.1 F (36.7 C)    Ht 5' 4"  (1.626 m)    Wt 103 lb 4 oz (46.8 kg)    BMI 17.72 kg/m   Constitutional:   Caucasian female appears to be in NAD, Well developed, Well nourished, alert and cooperative Respiratory: Respirations even and unlabored. Lungs clear to auscultation bilaterally.   No wheezes, crackles, or rhonchi.  Cardiovascular: Normal S1, S2. No MRG. Regular rate and rhythm. No peripheral edema, cyanosis or pallor.  Gastrointestinal:  Soft, nondistended, nontender. No rebound or guarding. Normal bowel sounds. No appreciable masses or hepatomegaly. Psychiatric: Oriented to person, place and time. Slow mentation  MOST RECENT LABS AND IMAGING: CBC    Component Value Date/Time   WBC 8.3 01/24/2019 1122   RBC 3.95 01/24/2019 1122   HGB 11.0 (L) 01/24/2019 1122   HCT 34.2 (L) 01/24/2019 1122   PLT 122.0 (L) 01/24/2019 1122   MCV 86.5 01/24/2019  1122   MCH 27.2 01/19/2019 1906   MCHC 32.1 01/24/2019 1122   RDW 28.6 (H) 01/24/2019 1122   LYMPHSABS 1.9 01/24/2019 1122   MONOABS 0.7 01/24/2019 1122   EOSABS 0.1 01/24/2019 1122   BASOSABS 0.1 01/24/2019 1122    CMP     Component Value Date/Time   NA 131 (L) 01/19/2019 1906   K 4.3 01/19/2019 1906   CL 98 01/19/2019 1906   CO2 22 01/19/2019 1906   GLUCOSE 223 (H) 01/19/2019 1906   BUN 15 01/19/2019 1906   CREATININE 1.00 01/19/2019 1906   CALCIUM 9.5 01/19/2019 1906   PROT 6.8 01/19/2019 1906   ALBUMIN 3.5 01/19/2019 1906   AST 27 01/19/2019 1906   ALT 16 01/19/2019 1906   ALKPHOS 79 01/19/2019 1906   BILITOT 0.9 01/19/2019 1906   GFRNONAA 58 (L) 01/19/2019 1906   GFRAA >60 01/19/2019 1906    Assessment: 1.  Melena: 1-2 times a day for the past 2 weeks, hemoglobin is actually increased from 10.4--> 11 at last time of check 2 days ago; concern for upper GI bleed with known varices and portal gastropathy 2.  Anemia: From above 3.  Alcoholic cirrhosis with ascites: Currently mostly stable  Plan: 1.  Scheduled patient for repeat EGD at the hospital as she may need variceal banding.  Scheduled this with Dr. [name].  Did discuss risks, benefits, limitations and alternatives and patient agrees to proceed. 2.  Patient to continue current medications. 3.  Discussed with the patient that if she has an increase in melena meaning more times a day or starts to feel lightheaded/dizziness or palpitations she needs to proceed to the ER.  Patient verbalized understanding. 4.  Discussed  with patient she should be taking her Lactulose enough that she is having 3-4 soft formed bowel movements a day but she tells me she cannot tolerate this medication.  Hopefully we can get Xifaxan approved soon. 5.  Patient to follow in clinic per recommendations from Dr. [name] after time of procedure.  Ellouise Newer, PA-C [facility] 01/26/2019, 11:04 AM  Cc: Tower, [name], MD

## 2019-01-29 NOTE — Progress Notes (Signed)
Reviewed and agree with documentation and assessment and plan. K. Veena Nandigam , MD   

## 2019-01-30 ENCOUNTER — Telehealth: Payer: Self-pay | Admitting: Gastroenterology

## 2019-01-30 NOTE — Telephone Encounter (Signed)
Dr. [name] sent patient to hospital last weekend and the Dr. Parks Ranger the patient seen upped the dosage of her pantoprazole to 2 by mouth daily instead of 1. She needed approval from Dr. [name] that this is okay. And if so then she only has enough pantopraze to make it through this week and will need a refill but needs it changed to 2 daily if Dr. [name] approves.

## 2019-01-30 NOTE — Telephone Encounter (Signed)
Dr [name]

## 2019-01-30 NOTE — Telephone Encounter (Signed)
She may have been advised to increase Pantoprazole dose by Ellouise Newer during office visit?? Ok to send Rx for Protonix 57m BID. Thanks

## 2019-01-31 DIAGNOSIS — M545 Low back pain: Secondary | ICD-10-CM | POA: Diagnosis not present

## 2019-01-31 MED ORDER — PANTOPRAZOLE SODIUM 40 MG PO TBEC: 40.0000 mg | DELAYED_RELEASE_TABLET | Freq: Two times a day (BID) | ORAL | 3 refills | Status: DC

## 2019-01-31 NOTE — Telephone Encounter (Signed)
Sent rx for protonix twice daily to patients pharmacy

## 2019-02-02 ENCOUNTER — Telehealth: Payer: Self-pay | Admitting: Gastroenterology

## 2019-02-02 DIAGNOSIS — M545 Low back pain: Secondary | ICD-10-CM | POA: Diagnosis not present

## 2019-02-02 NOTE — Telephone Encounter (Signed)
Beth do you know the status of this patients Xifaxian or have you seen anything from it?  See 10/14 note  Thanks

## 2019-02-02 NOTE — Telephone Encounter (Signed)
Christine Barton, this is the lady who could not get Xifaxan because the patient assistance decided that she had prescription drug coverage. She has income that is very close to the poverty level. The rep was going to look into this for [place].  Have you heard anything from him?

## 2019-02-02 NOTE — Telephone Encounter (Signed)
I'm so sorry - Catalina Antigua actually asked me about this yesterday and I couldn't remember who the patient was.  I will text him and get this going again.  Sorry for the delay.

## 2019-02-02 NOTE — Telephone Encounter (Signed)
Pt said that there is another medication that Dr. [name] has been working on getting for pt. Pt did not remember the name of the medication just said that it is very expensive. She wants to know the status of that. Pls call her.

## 2019-02-04 ENCOUNTER — Other Ambulatory Visit: Payer: Self-pay

## 2019-02-04 ENCOUNTER — Ambulatory Visit
Admission: RE | Admit: 2019-02-04 | Discharge: 2019-02-04 | Disposition: A | Discharge status: Home / Self Care | Payer: Medicare HMO | Source: Ambulatory Visit | Attending: Rehabilitation | Admitting: Rehabilitation

## 2019-02-04 DIAGNOSIS — M48061 Spinal stenosis, lumbar region without neurogenic claudication: Secondary | ICD-10-CM | POA: Diagnosis not present

## 2019-02-04 DIAGNOSIS — M545 Low back pain, unspecified: Secondary | ICD-10-CM

## 2019-02-05 DIAGNOSIS — M545 Low back pain: Secondary | ICD-10-CM | POA: Diagnosis not present

## 2019-02-07 ENCOUNTER — Ambulatory Visit (INDEPENDENT_AMBULATORY_CARE_PROVIDER_SITE_OTHER): Payer: Medicare HMO | Admitting: Family Medicine

## 2019-02-07 ENCOUNTER — Other Ambulatory Visit: Payer: Self-pay

## 2019-02-07 ENCOUNTER — Encounter: Payer: Self-pay | Admitting: Family Medicine

## 2019-02-07 VITALS — BP 126/70 | HR 67 | Temp 97.5°F | Wt 104.0 lb

## 2019-02-07 DIAGNOSIS — E118 Type 2 diabetes mellitus with unspecified complications: Secondary | ICD-10-CM | POA: Diagnosis not present

## 2019-02-07 DIAGNOSIS — D500 Iron deficiency anemia secondary to blood loss (chronic): Secondary | ICD-10-CM

## 2019-02-07 DIAGNOSIS — E785 Hyperlipidemia, unspecified: Secondary | ICD-10-CM

## 2019-02-07 DIAGNOSIS — I700 Atherosclerosis of aorta: Secondary | ICD-10-CM

## 2019-02-07 DIAGNOSIS — K7031 Alcoholic cirrhosis of liver with ascites: Secondary | ICD-10-CM | POA: Diagnosis not present

## 2019-02-07 DIAGNOSIS — E1169 Type 2 diabetes mellitus with other specified complication: Secondary | ICD-10-CM

## 2019-02-07 DIAGNOSIS — I8500 Esophageal varices without bleeding: Secondary | ICD-10-CM | POA: Diagnosis not present

## 2019-02-07 DIAGNOSIS — F17200 Nicotine dependence, unspecified, uncomplicated: Secondary | ICD-10-CM

## 2019-02-07 LAB — POCT GLYCOSYLATED HEMOGLOBIN (HGB A1C): Hemoglobin A1C: 6.7 % — AB (ref 4.0–5.6)

## 2019-02-07 MED ORDER — METFORMIN HCL ER 500 MG PO TB24: 500.0000 mg | ORAL_TABLET | Freq: Every day | ORAL | 3 refills | Status: DC

## 2019-02-07 NOTE — Assessment & Plan Note (Signed)
Disc goals for lipids and reasons to control them Rev last labs with pt Rev low sat fat diet in detail No statin due to cirrhosis  LDL is 70

## 2019-02-07 NOTE — Assessment & Plan Note (Signed)
Disc in detail risks of smoking and possible outcomes including copd, vascular/ heart disease, cancer , respiratory and sinus infections  Pt voices understanding Pt is currently not interested in quitting

## 2019-02-07 NOTE — Assessment & Plan Note (Signed)
No clinical symptoms  Watching lipids Pt will not quit smoking unfortunately

## 2019-02-07 NOTE — Progress Notes (Signed)
Subjective:    Patient ID: Christine Barton, female    DOB: May 22, 1950, 68 y.o.   MRN: [id number]  HPI  Here for f/u of chronic medical problems   Wt Readings from Last 3 Encounters:  02/07/19 104 lb (47.2 kg)  01/26/19 103 lb 4 oz (46.8 kg)  01/19/19 103 lb (46.7 kg)  appetite came back and trying to gain weight  17.85 kg/m   PT is helping her back pain   Smoking status  About the same Not ready to quit and does not think she ever will quit  Breathing is ok-no changes  Being careful re: pandemic    DM2 Saw nutritionist at [facility]- seen for cirrhosis - interestingly did not tell her she has diabetes as we asked  Lab Results  Component Value Date   HGBA1C 7.0 (H) 11/06/2018   Due for a1c today -much improved Lab Results  Component Value Date   HGBA1C 6.7 (A) 02/07/2019  she sees a dietician  Now on metformin xr 500 mg daily  Trying to gain weight  Is being very careful with sweets  Only once in a while  Her appetite came back   Lab Results  Component Value Date   MICROALBUR <0.7 04/24/2018   Eye exam 8/20  Anemia/ in setting of esoph varicies and cirrhosis Thinks she has bleeding currently  She still has black stool but not taking iron   Lab Results  Component Value Date   WBC 8.3 01/24/2019   HGB 11.0 (L) 01/24/2019   HCT 34.2 (L) 01/24/2019   MCV 86.5 01/24/2019   PLT 122.0 (L) 01/24/2019   Lab Results  Component Value Date   FERRITIN 7.7 (L) 01/01/2019  cannot tolerate iron so has had 2 iron infusions   Lab Results  Component Value Date   ALT 16 01/19/2019   AST 27 01/19/2019   ALKPHOS 79 01/19/2019   BILITOT 0.9 01/19/2019    Lab Results  Component Value Date   CREATININE 1.00 01/19/2019   BUN 15 01/19/2019   NA 131 (L) 01/19/2019   K 4.3 01/19/2019   CL 98 01/19/2019   CO2 22 01/19/2019   Hyperlipidemia Lab Results  Component Value Date   CHOL 128 11/06/2018   HDL 32.60 (L) 11/06/2018   LDLCALC 70 11/06/2018   LDLDIRECT 83.0  10/31/2017   TRIG 125.0 11/06/2018   CHOLHDL 4 11/06/2018  no statin due to cirrhosis   Patient Active Problem List   Diagnosis Date Noted   Aortic atherosclerosis ([place]) 01/22/2019   Coronary atherosclerosis 01/22/2019   Encounter for screening for lung cancer 01/04/2019   Low back pain 12/07/2018   Venous insufficiency 11/15/2018   Hyponatremia 11/07/2018   Elevated serum creatinine 11/07/2018   Esophageal varices without bleeding ([place]) 10/27/2018   Anemia 10/23/2018   Fatigue 10/23/2018   Poor balance 05/30/2018   Falls 05/30/2018   Generalized weakness 05/30/2018   Cirrhosis of liver ([place]) 04/24/2018   Ascites 04/24/2018   Gallstones 04/24/2018   Heme positive stool 04/24/2018   Screening mammogram, encounter for 11/07/2017   Smoker 11/07/2017   Screening examination for STD (sexually transmitted disease) 11/11/2016   Welcome to Medicare preventive visit 10/26/2016   Estrogen deficiency 10/26/2016   Colon cancer screening 11/01/2014   Elevated liver enzymes 11/01/2014   Encounter for routine gynecological examination 09/12/2013   Rapid heart beat 10/10/2012   Heartburn 08/20/2011   Routine general medical examination at a health care facility 07/21/2011  Vitamin D deficiency 08/04/2009   POSTMENOPAUSAL STATUS 08/04/2009   Controlled diabetes mellitus type 2 with complications ([place]) 82/42/3536   Hyperlipidemia associated with type 2 diabetes mellitus ([place]) 06/14/2008   DISC DISEASE, CERVICAL 03/30/2007   History of alcohol abuse 12/06/2006   Neuropathy of both feet 12/06/2006   DIVERTICULOSIS, COLON 12/06/2006   Fatty liver 12/06/2006   BREAST CANCER, HX OF 12/06/2006   Past Medical History:  Diagnosis Date   Alcohol abuse, unspecified    Breast cancer ([place]) 1998   Right   Cataract    left eye   Cervical spondylosis 2006   MRI   Degeneration of cervical intervertebral disc 2006   MRI   Diverticulosis of colon  (without mention of hemorrhage)    Hyperpotassemia    Microscopic hematuria    Mononeuritis of unspecified site    Nonspecific abnormal results of liver function study    Other abnormal glucose    Other and unspecified hyperlipidemia    no per pt   Other chronic nonalcoholic liver disease    Personal history of chemotherapy    Personal history of malignant neoplasm of breast    Personal history of radiation therapy    Pneumothorax, acute    right, spontaneous   Tobacco use disorder    Unspecified vitamin D deficiency    Past Surgical History:  Procedure Laterality Date   BREAST BIOPSY  9/03   Right   BREAST LUMPECTOMY Right 1998   BREAST LUMPECTOMY  1998   CHEST TUBE INSERTION  11/02/2014   COLONOSCOPY     ESOPHAGOGASTRODUODENOSCOPY (EGD) WITH PROPOFOL N/A 10/30/2018   Procedure: ESOPHAGOGASTRODUODENOSCOPY (EGD) WITH PROPOFOL;  Surgeon: Mauri Pole, MD;  Location: [facility];  Service: Endoscopy;  Laterality: N/A;   EYE SURGERY  02/2017   cataract extraction with lens implant   TUBAL LIGATION     Social History   Tobacco Use   Smoking status: Current Every Day Smoker    Packs/day: 1.00    Years: 53.00    Pack years: 53.00    Types: Cigarettes   Smokeless tobacco: Never Used   Tobacco comment: tobacco info given 04/27/2018  Substance Use Topics   Alcohol use: Not Currently    Comment: occasional use   Drug use: No   Family History  Problem Relation Age of Onset   Heart failure Father    Colon cancer Maternal Uncle    Stroke Mother    Esophageal cancer Neg Hx    Rectal cancer Neg Hx    Stomach cancer Neg Hx    Allergies  Allergen Reactions   Oxycodone Nausea And Vomiting   Glipizide Other (See Comments)    Stomach pain   Current Outpatient Medications on File Prior to Visit  Medication Sig Dispense Refill   acetaminophen (TYLENOL) 500 MG tablet Take 500 mg by mouth 2 (two) times a day. Uses only as needed      albuterol (VENTOLIN HFA) 108 (90 Base) MCG/ACT inhaler Inhale 2 puffs into the lungs every 4 (four) hours as needed for wheezing or shortness of breath. 18 g 3   doxylamine, Sleep, (UNISOM) 25 MG tablet Take 25 mg by mouth at bedtime as needed for sleep.      furosemide (LASIX) 20 MG tablet Take 20 mg by mouth daily.      lactulose (CHRONULAC) 10 GM/15ML solution Take 15 mLs (10 g total) by mouth 3 (three) times daily. (Patient taking differently: Take 20 g by mouth See  admin instructions. 2 tablespoons daily) 236 mL 11   Multiple Vitamin (MULTIVITAMIN) tablet Take 1 tablet by mouth daily.     nadolol (CORGARD) 20 MG tablet Take 1 tablet (20 mg total) by mouth daily. 30 tablet 11   pantoprazole (PROTONIX) 40 MG tablet Take 1 tablet (40 mg total) by mouth 2 (two) times daily before a meal. 60 tablet 3   Polyethyl Glycol-Propyl Glycol (SYSTANE OP) Place 1 drop into the left eye daily as needed (dryness).     spironolactone (ALDACTONE) 25 MG tablet Take 25 mg by mouth daily.     No current facility-administered medications on file prior to visit.      Review of Systems  Constitutional: Negative for activity change, appetite change, fatigue, fever and unexpected weight change.  HENT: Negative for congestion, ear pain, rhinorrhea, sinus pressure and sore throat.   Eyes: Negative for pain, redness and visual disturbance.  Respiratory: Negative for cough, shortness of breath and wheezing.   Cardiovascular: Negative for chest pain and palpitations.  Gastrointestinal: Positive for diarrhea. Negative for abdominal pain, blood in stool, constipation, nausea and vomiting.       Dark stool  Chronic stomach upset  Gets diarrhea from medication   Endocrine: Negative for polydipsia and polyuria.  Genitourinary: Negative for dysuria, frequency and urgency.  Musculoskeletal: Negative for arthralgias, back pain and myalgias.  Skin: Negative for pallor and rash.  Allergic/Immunologic: Negative for  environmental allergies.  Neurological: Negative for dizziness, syncope and headaches.  Hematological: Negative for adenopathy. Does not bruise/bleed easily.  Psychiatric/Behavioral: Negative for decreased concentration and dysphoric mood. The patient is not nervous/anxious.        Objective:   Physical Exam Constitutional:      General: She is not in acute distress.    Appearance: Normal appearance. She is well-developed. She is not ill-appearing.     Comments: Underweight and frail appearing   HENT:     Head: Normocephalic and atraumatic.     Mouth/Throat:     Mouth: Mucous membranes are moist.  Eyes:     General: No scleral icterus.    Conjunctiva/sclera: Conjunctivae normal.     Pupils: Pupils are equal, round, and reactive to light.  Neck:     Musculoskeletal: Normal range of motion and neck supple. No neck rigidity.     Thyroid: No thyromegaly.     Vascular: No carotid bruit or JVD.  Cardiovascular:     Rate and Rhythm: Normal rate and regular rhythm.     Pulses: Normal pulses.     Heart sounds: Normal heart sounds. No gallop.   Pulmonary:     Effort: Pulmonary effort is normal. No respiratory distress.     Breath sounds: Normal breath sounds. No wheezing or rales.  Abdominal:     General: Bowel sounds are normal. There is no distension or abdominal bruit.     Palpations: Abdomen is soft. There is no mass.     Tenderness: There is abdominal tenderness. There is no guarding or rebound.     Hernia: No hernia is present.     Comments: Mild epigastric tenderness  Musculoskeletal:     Right lower leg: No edema.     Left lower leg: No edema.  Lymphadenopathy:     Cervical: No cervical adenopathy.  Skin:    General: Skin is warm and dry.     Findings: No rash.  Neurological:     Mental Status: She is alert.     Cranial  Nerves: No cranial nerve deficit.     Motor: No weakness.     Deep Tendon Reflexes: Reflexes are normal and symmetric.  Psychiatric:        Mood and  Affect: Mood normal.           Assessment & Plan:   Problem List Items Addressed This Visit      Cardiovascular and Mediastinum   Esophageal varices without bleeding (HCC)    Anemia is stable with IV iron tx  EGD upcoming  In setting of cirrhosis  Continues with dark stool Followed by GI Continues nadolol       Aortic atherosclerosis (HCC)    No clinical symptoms  Watching lipids Pt will not quit smoking unfortunately        Digestive   Cirrhosis of liver ([place])    Continues GI and liver specialty care Under obs for bleeding esoph varicies  Taking nadolol and lactulose as well as aldactone and lasix for swelling Overall stable        Endocrine   Hyperlipidemia associated with type 2 diabetes mellitus ([place])    Disc goals for lipids and reasons to control them Rev last labs with pt Rev low sat fat diet in detail No statin due to cirrhosis  LDL is 70       Relevant Medications   metFORMIN (GLUCOPHAGE XR) 500 MG 24 hr tablet   Controlled diabetes mellitus type 2 with complications ([place]) - Primary    Improved with diet and metformin Lab Results  Component Value Date   HGBA1C 6.7 (A) 02/07/2019   Again asked pt to discuss her diabetes with the nutritionist she sees for cirrhosis  opth exam is utd Discussed foot care  microalb utd No statin due to cirrhosis       Relevant Medications   metFORMIN (GLUCOPHAGE XR) 500 MG 24 hr tablet   Other Relevant Orders   POCT glycosylated hemoglobin (Hb A1C) (Completed)     Other   Smoker    Disc in detail risks of smoking and possible outcomes including copd, vascular/ heart disease, cancer , respiratory and sinus infections  Pt voices understanding Pt is currently not interested in quitting      Anemia    Due to esophageal varicies from cirrhosis  Improved with IV iron tx (she does not tolerate oral iron) Followed by GI and next EGD is planned

## 2019-02-07 NOTE — Patient Instructions (Signed)
Take care of yourself   Consider quitting smoking  Stay active Keep increasing calories for weight gain -but not in the form of sweets  Diabetes is improved with an A1C of 6.7-that is great Continue the metformin   Glad you are doing better

## 2019-02-07 NOTE — Assessment & Plan Note (Signed)
Improved with diet and metformin Lab Results  Component Value Date   HGBA1C 6.7 (A) 02/07/2019   Again asked pt to discuss her diabetes with the nutritionist she sees for cirrhosis  opth exam is utd Discussed foot care  microalb utd No statin due to cirrhosis

## 2019-02-07 NOTE — Assessment & Plan Note (Signed)
Due to esophageal varicies from cirrhosis  Improved with IV iron tx (she does not tolerate oral iron) Followed by GI and next EGD is planned

## 2019-02-07 NOTE — Assessment & Plan Note (Signed)
Anemia is stable with IV iron tx  EGD upcoming  In setting of cirrhosis  Continues with dark stool Followed by GI Continues nadolol

## 2019-02-07 NOTE — Telephone Encounter (Signed)
Spoke with [place] and she said they sounded like a good candidate for assistance.  She needs to apply at [email or web address]

## 2019-02-07 NOTE — Assessment & Plan Note (Signed)
Continues GI and liver specialty care Under obs for bleeding esoph varicies  Taking nadolol and lactulose as well as aldactone and lasix for swelling Overall stable

## 2019-02-08 NOTE — Telephone Encounter (Signed)
Pt stated that someone called her around 5:30 PM yesterday.

## 2019-02-08 NOTE — Telephone Encounter (Signed)
I'm sorry - I don't think I realized it was [facility] who turned her down.  I texted Alyse Low again for other options.

## 2019-02-08 NOTE — Telephone Encounter (Signed)
Christine Barton, I appreciate all your help. The patient has applied to [facility] PAP. They are the ones who turned her down because she has a Part D. But her co-pay is $917 a month. She cannot afford this. (Who could afford this) I was able to get a patient approve for assistance 5 years ago based on income. This time I have been unsuccessful.  Can the rep offer a contact to help with this?

## 2019-02-09 ENCOUNTER — Other Ambulatory Visit: Payer: Self-pay

## 2019-02-09 ENCOUNTER — Other Ambulatory Visit: Payer: Self-pay | Admitting: *Deleted

## 2019-02-09 DIAGNOSIS — M545 Low back pain: Secondary | ICD-10-CM | POA: Diagnosis not present

## 2019-02-09 MED ORDER — RIFAXIMIN 550 MG PO TABS: 550.0000 mg | ORAL_TABLET | Freq: Two times a day (BID) | ORAL | 3 refills | Status: DC

## 2019-02-09 NOTE — Telephone Encounter (Signed)
I have reached out to the pharmacy to ask for proof of the patient's responsibility for Xifaxan after insurance.  I have notified the patient of where we are in trying to get her help paying for Xifaxan.

## 2019-02-09 NOTE — Telephone Encounter (Signed)
Christine Barton told me the rep was instructed by her manager to have the pharmacy provide documentation quoting the patient's responsibility amount and send that in with the paperwork. On the paperwork that is re-submitted write that this is being re-submitted with the patient's financial responsibility for a chronic medication. The patient is aware of this, because I told her this morning. I have the application that was submitted. Waiting for the pharmacy to respond. I sent over a prescription this morning. Has anything been faxed back to [place] yet?

## 2019-02-09 NOTE — Telephone Encounter (Signed)
Magda Paganini have you heard anything else about this, Let me or Beth know  Thanks!!

## 2019-02-12 ENCOUNTER — Encounter: Payer: Self-pay | Admitting: Gastroenterology

## 2019-02-12 ENCOUNTER — Ambulatory Visit: Payer: Medicare HMO | Admitting: Cardiology

## 2019-02-12 ENCOUNTER — Other Ambulatory Visit (INDEPENDENT_AMBULATORY_CARE_PROVIDER_SITE_OTHER): Payer: Medicare HMO

## 2019-02-12 ENCOUNTER — Ambulatory Visit: Payer: Medicare HMO | Admitting: Gastroenterology

## 2019-02-12 ENCOUNTER — Telehealth: Payer: Self-pay | Admitting: Family Medicine

## 2019-02-12 ENCOUNTER — Other Ambulatory Visit: Payer: Self-pay

## 2019-02-12 ENCOUNTER — Other Ambulatory Visit: Payer: Self-pay | Admitting: *Deleted

## 2019-02-12 VITALS — BP 110/52 | HR 72 | Temp 97.8°F | Ht 64.0 in | Wt 101.5 lb

## 2019-02-12 DIAGNOSIS — M545 Low back pain: Secondary | ICD-10-CM | POA: Diagnosis not present

## 2019-02-12 DIAGNOSIS — K219 Gastro-esophageal reflux disease without esophagitis: Secondary | ICD-10-CM

## 2019-02-12 DIAGNOSIS — I8510 Secondary esophageal varices without bleeding: Secondary | ICD-10-CM | POA: Diagnosis not present

## 2019-02-12 DIAGNOSIS — D509 Iron deficiency anemia, unspecified: Secondary | ICD-10-CM

## 2019-02-12 DIAGNOSIS — K3189 Other diseases of stomach and duodenum: Secondary | ICD-10-CM

## 2019-02-12 DIAGNOSIS — K7290 Hepatic failure, unspecified without coma: Secondary | ICD-10-CM

## 2019-02-12 DIAGNOSIS — M47816 Spondylosis without myelopathy or radiculopathy, lumbar region: Secondary | ICD-10-CM | POA: Diagnosis not present

## 2019-02-12 DIAGNOSIS — M542 Cervicalgia: Secondary | ICD-10-CM | POA: Diagnosis not present

## 2019-02-12 DIAGNOSIS — K766 Portal hypertension: Secondary | ICD-10-CM | POA: Diagnosis not present

## 2019-02-12 DIAGNOSIS — K7031 Alcoholic cirrhosis of liver with ascites: Secondary | ICD-10-CM

## 2019-02-12 DIAGNOSIS — K7682 Hepatic encephalopathy: Secondary | ICD-10-CM

## 2019-02-12 LAB — CBC WITH DIFFERENTIAL/PLATELET
Basophils Absolute: 0.1 10*3/uL (ref 0.0–0.1)
Basophils Relative: 0.6 % (ref 0.0–3.0)
Eosinophils Absolute: 0.1 10*3/uL (ref 0.0–0.7)
Eosinophils Relative: 1 % (ref 0.0–5.0)
HCT: 35.7 % — ABNORMAL LOW (ref 36.0–46.0)
Hemoglobin: 12 g/dL (ref 12.0–15.0)
Lymphocytes Relative: 26.3 % (ref 12.0–46.0)
Lymphs Abs: 2.3 10*3/uL (ref 0.7–4.0)
MCHC: 33.7 g/dL (ref 30.0–36.0)
MCV: 89.2 fl (ref 78.0–100.0)
Monocytes Absolute: 0.7 10*3/uL (ref 0.1–1.0)
Monocytes Relative: 8.4 % (ref 3.0–12.0)
Neutro Abs: 5.5 10*3/uL (ref 1.4–7.7)
Neutrophils Relative %: 63.7 % (ref 43.0–77.0)
Platelets: 135 10*3/uL — ABNORMAL LOW (ref 150.0–400.0)
RBC: 4 Mil/uL (ref 3.87–5.11)
RDW: 26.1 % — ABNORMAL HIGH (ref 11.5–15.5)
WBC: 8.6 10*3/uL (ref 4.0–10.5)

## 2019-02-12 LAB — COMPREHENSIVE METABOLIC PANEL
ALT: 11 U/L (ref 0–35)
AST: 22 U/L (ref 0–37)
Albumin: 4.2 g/dL (ref 3.5–5.2)
Alkaline Phosphatase: 81 U/L (ref 39–117)
BUN: 18 mg/dL (ref 6–23)
CO2: 26 mEq/L (ref 19–32)
Calcium: 10.5 mg/dL (ref 8.4–10.5)
Chloride: 97 mEq/L (ref 96–112)
Creatinine, Ser: 0.75 mg/dL (ref 0.40–1.20)
GFR: 76.81 mL/min (ref >60.00)
Glucose, Bld: 141 mg/dL — ABNORMAL HIGH (ref 70–99)
Potassium: 4.8 mEq/L (ref 3.5–5.1)
Sodium: 132 mEq/L — ABNORMAL LOW (ref 135–145)
Total Bilirubin: 0.9 mg/dL (ref 0.2–1.2)
Total Protein: 7.4 g/dL (ref 6.0–8.3)

## 2019-02-12 LAB — PROTIME-INR
INR: 1.2 ratio — ABNORMAL HIGH (ref 0.8–1.0)
Prothrombin Time: 14.5 s — ABNORMAL HIGH (ref 9.6–13.1)

## 2019-02-12 NOTE — Telephone Encounter (Signed)
Patient dropped of MRI of back report and disk.  In rx tower.

## 2019-02-12 NOTE — Telephone Encounter (Signed)
Aware, thanks  Showed mild lumbar spondylosis  She is improved with PT

## 2019-02-12 NOTE — Telephone Encounter (Signed)
In your inbox.

## 2019-02-12 NOTE — H&P (View-Only) (Signed)
Christine Barton    [id number]    [birth year]  Primary Care Physician:Tower, [name], MD  Referring Physician: Tower, [name], MD [place],  [place] [address]   Chief complaint: Cirrhosis  HPI:  68 year old female with history of EtOH and NASH cirrhosis for follow-up visit  She continues to have dark stool.  Hemoglobin improved s/p IV iron Her weight is stable She is taking lactulose, waiting approval for Xifaxan. Denies any nausea, vomiting, abdominal pain, or bright red blood per rectum    Outpatient Encounter Medications as of 02/12/2019  Medication Sig  . acetaminophen (TYLENOL) 500 MG tablet Take 500 mg by mouth 2 (two) times a day. Uses only as needed  . albuterol (VENTOLIN HFA) 108 (90 Base) MCG/ACT inhaler Inhale 2 puffs into the lungs every 4 (four) hours as needed for wheezing or shortness of breath.  . doxylamine, Sleep, (UNISOM) 25 MG tablet Take 25 mg by mouth at bedtime as needed for sleep.   . furosemide (LASIX) 20 MG tablet Take 20 mg by mouth daily.   Marland Kitchen lactulose (CHRONULAC) 10 GM/15ML solution Take 15 mLs (10 g total) by mouth 3 (three) times daily. (Patient taking differently: Take 20 g by mouth See admin instructions. 2 tablespoons daily)  . metFORMIN (GLUCOPHAGE XR) 500 MG 24 hr tablet Take 1 tablet (500 mg total) by mouth daily with breakfast.  . Multiple Vitamin (MULTIVITAMIN) tablet Take 1 tablet by mouth daily.  . nadolol (CORGARD) 20 MG tablet Take 1 tablet (20 mg total) by mouth daily.  . pantoprazole (PROTONIX) 40 MG tablet Take 1 tablet (40 mg total) by mouth 2 (two) times daily before a meal.  . Polyethyl Glycol-Propyl Glycol (SYSTANE OP) Place 1 drop into the left eye daily as needed (dryness).  Marland Kitchen spironolactone (ALDACTONE) 25 MG tablet Take 25 mg by mouth daily.  . rifaximin (XIFAXAN) 550 MG TABS tablet Take 1 tablet (550 mg total) by mouth 2 (two) times daily. (Patient not taking: Reported on 02/12/2019)   No  facility-administered encounter medications on file as of 02/12/2019.     Allergies as of 02/12/2019 - Review Complete 02/07/2019  Allergen Reaction Noted  . Oxycodone Nausea And Vomiting 11/02/2014  . Glipizide Other (See Comments) 09/12/2013    Past Medical History:  Diagnosis Date  . Alcohol abuse, unspecified   . Breast cancer ([place]) 1998   Right  . Cataract    left eye  . Cervical spondylosis 2006   MRI  . Degeneration of cervical intervertebral disc 2006   MRI  . Diverticulosis of colon (without mention of hemorrhage)   . Hyperpotassemia   . Microscopic hematuria   . Mononeuritis of unspecified site   . Nonspecific abnormal results of liver function study   . Other abnormal glucose   . Other and unspecified hyperlipidemia    no per pt  . Other chronic nonalcoholic liver disease   . Personal history of chemotherapy   . Personal history of malignant neoplasm of breast   . Personal history of radiation therapy   . Pneumothorax, acute    right, spontaneous  . Tobacco use disorder   . Unspecified vitamin D deficiency     Past Surgical History:  Procedure Laterality Date  . BREAST BIOPSY  9/03   Right  . BREAST LUMPECTOMY Right 1998  . BREAST LUMPECTOMY  1998  . CHEST TUBE INSERTION  11/02/2014  . COLONOSCOPY    .  ESOPHAGOGASTRODUODENOSCOPY (EGD) WITH PROPOFOL N/A 10/30/2018   Procedure: ESOPHAGOGASTRODUODENOSCOPY (EGD) WITH PROPOFOL;  Surgeon: Mauri Pole, MD;  Location: [facility];  Service: Endoscopy;  Laterality: N/A;  . EYE SURGERY  02/2017   cataract extraction with lens implant  . TUBAL LIGATION      Family History  Problem Relation Age of Onset  . Heart failure Father   . Colon cancer Maternal Uncle   . Stroke Mother   . Esophageal cancer Neg Hx   . Rectal cancer Neg Hx   . Stomach cancer Neg Hx     Social History   Socioeconomic History  . Marital status: Single    Spouse name: Not on file  . Number of children: 1  . Years of  education: Not on file  . Highest education level: Not on file  Occupational History  . Occupation: retired    Fish farm manager: [employer]  Social Needs  . Financial resource strain: Not hard at all  . Food insecurity    Worry: Never true    Inability: Never true  . Transportation needs    Medical: No    Non-medical: No  Tobacco Use  . Smoking status: Current Every Day Smoker    Packs/day: 1.00    Years: 53.00    Pack years: 53.00    Types: Cigarettes  . Smokeless tobacco: Never Used  . Tobacco comment: tobacco info given 04/27/2018  Substance and Sexual Activity  . Alcohol use: Not Currently    Comment: occasional use  . Drug use: No  . Sexual activity: Not Currently  Lifestyle  . Physical activity    Days per week: 0 days    Minutes per session: 0 min  . Stress: Only a little  Relationships  . Social Herbalist on phone: Not on file    Gets together: Not on file    Attends religious service: Not on file    Active member of club or organization: Not on file    Attends meetings of clubs or organizations: Not on file    Relationship status: Not on file  . Intimate partner violence    Fear of current or ex partner: No    Emotionally abused: No    Physically abused: No    Forced sexual activity: No  Other Topics Concern  . Not on file  Social History Narrative   Divorced      1 child      Works at [employer] of systems: [employer]  Constitutional: Negative for fever and chills.  HENT: Negative.   Eyes: Negative for blurred vision.  Respiratory: Negative for cough, shortness of breath and wheezing.   Cardiovascular: Negative for chest pain and palpitations.  Gastrointestinal: as per HPI Genitourinary: Negative for dysuria, urgency, frequency and hematuria.  Musculoskeletal: Negative for myalgias, back pain and joint pain.  Skin: Negative for itching and rash.  Neurological: Negative for dizziness, tremors, focal  weakness, seizures and loss of consciousness.  Endo/Heme/Allergies: Negative Psychiatric/Behavioral: Negative for depression, suicidal ideas and hallucinations.  All other systems reviewed and are negative.   Physical Exam: Vitals:   02/12/19 0814  BP: (!) 110/52  Pulse: 72  Temp: 97.8 F (36.6 C)   Body mass index is 17.42 kg/m. Gen:      No acute distress HEENT:  EOMI, sclera anicteric Neck:     No masses; no thyromegaly Lungs:  Clear to auscultation bilaterally; normal respiratory effort CV:         Regular rate and rhythm; no murmurs Abd:      + bowel sounds; soft, non-tender; no palpable masses, no distension Ext:    No edema; adequate peripheral perfusion Skin:      Warm and dry; no rash Neuro: alert and oriented x 3.  No asterixis Psych: normal mood and affect  Data Reviewed:  Reviewed labs, radiology imaging, old records and pertinent past GI work up   Assessment and Plan/Recommendations:  68 year old female with history of breast cancer, diabetes, Nash and EtOH cirrhosis decompensated by ascites and hepatic encephalopathy MELD 8  Hepatic encephalopathy: Stable She has significant bloating, abdominal cramping and diarrhea with lactulose.  Await approval for Xifaxan by insurance Continue lactulose until Xifaxan is approved  Portal gastropathy and GERD: Protonix 40 mg daily Follow antireflux measures  Ascites/volume overload: No peripheral edema or significant ascites based on exam Recheck CMP, PT and INR We will consider further decreasing the diuretics based on labs  Dark stool: ?  Melena.  Hemodynamically stable.  Hemoglobin improved compared to prior s/p IV iron. No evidence of brisk GI bleed based on history, exam or labs We will continue to monitor with repeat CBC She is already scheduled for EGD for evaluation EGD February 2020: Showed gastropathy and grade 1 small esophageal varices  HCC screening: Up-to-date Normal abdominal ultrasound and AFP   Return in 2 to 3 months or sooner if needed   25 minutes was spent face-to-face with the patient. Greater than 50% of the time used for counseling as well as treatment plan and follow-up. She had multiple questions which were answered to her satisfaction  K. Denzil Magnuson , MD    CC: Tower, [name], MD

## 2019-02-12 NOTE — Progress Notes (Signed)
Christine Barton    [id number]    21-Dec-1950  Primary Care Physician:Tower, [name], MD  Referring Physician: Tower, [name], MD [place],  [place] [address]   Chief complaint: Cirrhosis  HPI:  67 year old female with history of EtOH and NASH cirrhosis for follow-up visit  She continues to have dark stool.  Hemoglobin improved s/p IV iron Her weight is stable She is taking lactulose, waiting approval for Xifaxan. Denies any nausea, vomiting, abdominal pain, or bright red blood per rectum    Outpatient Encounter Medications as of 02/12/2019  Medication Sig  . acetaminophen (TYLENOL) 500 MG tablet Take 500 mg by mouth 2 (two) times a day. Uses only as needed  . albuterol (VENTOLIN HFA) 108 (90 Base) MCG/ACT inhaler Inhale 2 puffs into the lungs every 4 (four) hours as needed for wheezing or shortness of breath.  . doxylamine, Sleep, (UNISOM) 25 MG tablet Take 25 mg by mouth at bedtime as needed for sleep.   . furosemide (LASIX) 20 MG tablet Take 20 mg by mouth daily.   Marland Kitchen lactulose (CHRONULAC) 10 GM/15ML solution Take 15 mLs (10 g total) by mouth 3 (three) times daily. (Patient taking differently: Take 20 g by mouth See admin instructions. 2 tablespoons daily)  . metFORMIN (GLUCOPHAGE XR) 500 MG 24 hr tablet Take 1 tablet (500 mg total) by mouth daily with breakfast.  . Multiple Vitamin (MULTIVITAMIN) tablet Take 1 tablet by mouth daily.  . nadolol (CORGARD) 20 MG tablet Take 1 tablet (20 mg total) by mouth daily.  . pantoprazole (PROTONIX) 40 MG tablet Take 1 tablet (40 mg total) by mouth 2 (two) times daily before a meal.  . Polyethyl Glycol-Propyl Glycol (SYSTANE OP) Place 1 drop into the left eye daily as needed (dryness).  Marland Kitchen spironolactone (ALDACTONE) 25 MG tablet Take 25 mg by mouth daily.  . rifaximin (XIFAXAN) 550 MG TABS tablet Take 1 tablet (550 mg total) by mouth 2 (two) times daily. (Patient not taking: Reported on 02/12/2019)   No  facility-administered encounter medications on file as of 02/12/2019.     Allergies as of 02/12/2019 - Review Complete 02/07/2019  Allergen Reaction Noted  . Oxycodone Nausea And Vomiting 11/02/2014  . Glipizide Other (See Comments) 09/12/2013    Past Medical History:  Diagnosis Date  . Alcohol abuse, unspecified   . Breast cancer ([place]) 1998   Right  . Cataract    left eye  . Cervical spondylosis 2006   MRI  . Degeneration of cervical intervertebral disc 2006   MRI  . Diverticulosis of colon (without mention of hemorrhage)   . Hyperpotassemia   . Microscopic hematuria   . Mononeuritis of unspecified site   . Nonspecific abnormal results of liver function study   . Other abnormal glucose   . Other and unspecified hyperlipidemia    no per pt  . Other chronic nonalcoholic liver disease   . Personal history of chemotherapy   . Personal history of malignant neoplasm of breast   . Personal history of radiation therapy   . Pneumothorax, acute    right, spontaneous  . Tobacco use disorder   . Unspecified vitamin D deficiency     Past Surgical History:  Procedure Laterality Date  . BREAST BIOPSY  9/03   Right  . BREAST LUMPECTOMY Right 1998  . BREAST LUMPECTOMY  1998  . CHEST TUBE INSERTION  11/02/2014  . COLONOSCOPY    .  ESOPHAGOGASTRODUODENOSCOPY (EGD) WITH PROPOFOL N/A 10/30/2018   Procedure: ESOPHAGOGASTRODUODENOSCOPY (EGD) WITH PROPOFOL;  Surgeon: Mauri Pole, MD;  Location: [facility];  Service: Endoscopy;  Laterality: N/A;  . EYE SURGERY  02/2017   cataract extraction with lens implant  . TUBAL LIGATION      Family History  Problem Relation Age of Onset  . Heart failure Father   . Colon cancer Maternal Uncle   . Stroke Mother   . Esophageal cancer Neg Hx   . Rectal cancer Neg Hx   . Stomach cancer Neg Hx     Social History   Socioeconomic History  . Marital status: Single    Spouse name: Not on file  . Number of children: 1  . Years of  education: Not on file  . Highest education level: Not on file  Occupational History  . Occupation: retired    Fish farm manager: [employer]  Social Needs  . Financial resource strain: Not hard at all  . Food insecurity    Worry: Never true    Inability: Never true  . Transportation needs    Medical: No    Non-medical: No  Tobacco Use  . Smoking status: Current Every Day Smoker    Packs/day: 1.00    Years: 53.00    Pack years: 53.00    Types: Cigarettes  . Smokeless tobacco: Never Used  . Tobacco comment: tobacco info given 04/27/2018  Substance and Sexual Activity  . Alcohol use: Not Currently    Comment: occasional use  . Drug use: No  . Sexual activity: Not Currently  Lifestyle  . Physical activity    Days per week: 0 days    Minutes per session: 0 min  . Stress: Only a little  Relationships  . Social Herbalist on phone: Not on file    Gets together: Not on file    Attends religious service: Not on file    Active member of club or organization: Not on file    Attends meetings of clubs or organizations: Not on file    Relationship status: Not on file  . Intimate partner violence    Fear of current or ex partner: No    Emotionally abused: No    Physically abused: No    Forced sexual activity: No  Other Topics Concern  . Not on file  Social History Narrative   Divorced      1 child      Works at [employer] of systems: [employer]  Constitutional: Negative for fever and chills.  HENT: Negative.   Eyes: Negative for blurred vision.  Respiratory: Negative for cough, shortness of breath and wheezing.   Cardiovascular: Negative for chest pain and palpitations.  Gastrointestinal: as per HPI Genitourinary: Negative for dysuria, urgency, frequency and hematuria.  Musculoskeletal: Negative for myalgias, back pain and joint pain.  Skin: Negative for itching and rash.  Neurological: Negative for dizziness, tremors, focal  weakness, seizures and loss of consciousness.  Endo/Heme/Allergies: Negative Psychiatric/Behavioral: Negative for depression, suicidal ideas and hallucinations.  All other systems reviewed and are negative.   Physical Exam: Vitals:   02/12/19 0814  BP: (!) 110/52  Pulse: 72  Temp: 97.8 F (36.6 C)   Body mass index is 17.42 kg/m. Gen:      No acute distress HEENT:  EOMI, sclera anicteric Neck:     No masses; no thyromegaly Lungs:  Clear to auscultation bilaterally; normal respiratory effort CV:         Regular rate and rhythm; no murmurs Abd:      + bowel sounds; soft, non-tender; no palpable masses, no distension Ext:    No edema; adequate peripheral perfusion Skin:      Warm and dry; no rash Neuro: alert and oriented x 3.  No asterixis Psych: normal mood and affect  Data Reviewed:  Reviewed labs, radiology imaging, old records and pertinent past GI work up   Assessment and Plan/Recommendations:  68 year old female with history of breast cancer, diabetes, Nash and EtOH cirrhosis decompensated by ascites and hepatic encephalopathy MELD 8  Hepatic encephalopathy: Stable She has significant bloating, abdominal cramping and diarrhea with lactulose.  Await approval for Xifaxan by insurance Continue lactulose until Xifaxan is approved  Portal gastropathy and GERD: Protonix 40 mg daily Follow antireflux measures  Ascites/volume overload: No peripheral edema or significant ascites based on exam Recheck CMP, PT and INR We will consider further decreasing the diuretics based on labs  Dark stool: ?  Melena.  Hemodynamically stable.  Hemoglobin improved compared to prior s/p IV iron. No evidence of brisk GI bleed based on history, exam or labs We will continue to monitor with repeat CBC She is already scheduled for EGD for evaluation EGD February 2020: Showed gastropathy and grade 1 small esophageal varices  HCC screening: Up-to-date Normal abdominal ultrasound and AFP   Return in 2 to 3 months or sooner if needed   25 minutes was spent face-to-face with the patient. Greater than 50% of the time used for counseling as well as treatment plan and follow-up. She had multiple questions which were answered to her satisfaction  K. Denzil Magnuson , MD    CC: Tower, [name], MD

## 2019-02-12 NOTE — Patient Instructions (Signed)
Go to the basement for labs today   Continue Lactulose  Decrease Protonix to 40 mg daily  Follow up in 3 months  If you are age 68 or older, your body mass index should be between 23-30. Your Body mass index is 17.42 kg/m. If this is out of the aforementioned range listed, please consider follow up with your Primary Care Provider.  If you are age 48 or younger, your body mass index should be between 19-25. Your Body mass index is 17.42 kg/m. If this is out of the aformentioned range listed, please consider follow up with your Primary Care Provider.    I appreciate the  opportunity to care for you  Thank You   Harl Bowie , MD

## 2019-02-13 ENCOUNTER — Encounter: Payer: Self-pay | Admitting: Cardiology

## 2019-02-13 ENCOUNTER — Ambulatory Visit (INDEPENDENT_AMBULATORY_CARE_PROVIDER_SITE_OTHER): Payer: Medicare HMO | Admitting: Cardiology

## 2019-02-13 VITALS — BP 126/58 | HR 66 | Temp 97.3°F | Ht 64.0 in | Wt 100.0 lb

## 2019-02-13 DIAGNOSIS — R0602 Shortness of breath: Secondary | ICD-10-CM | POA: Diagnosis not present

## 2019-02-13 DIAGNOSIS — I2510 Atherosclerotic heart disease of native coronary artery without angina pectoris: Secondary | ICD-10-CM | POA: Diagnosis not present

## 2019-02-13 DIAGNOSIS — E119 Type 2 diabetes mellitus without complications: Secondary | ICD-10-CM | POA: Diagnosis not present

## 2019-02-13 NOTE — Progress Notes (Signed)
Cardiology Office Note:    Date:  02/13/2019   ID:  SHEYENNE KONZ, DOB [birth year], MRN [id number]  PCP:  [name], MD  Cardiologist:  No primary care provider on file.  Electrophysiologist:  None   Referring MD: [name], MD   Chief Complaint  Patient presents with  . New Patient (Initial Visit)  . Coronary Artery Disease    History of Present Illness:    Christine Barton is a 68 y.o. female with a hx of cirrhosis c/besophageal varices, tobacco use, hyperlipidemia, type 2 diabetes who is referred by Dr. Glori Bickers for evaluation of coronary artery calcifications seen on chest CT.  She underwent a chest CT for lung cancer screening on 01/18/2019, which showed multivessel coronary artery calcifications.  Patient denies any chest pain.  States that her activity has been limited by back pain but has been doing physical therapy.  She does report intermittent dyspnea with exertion, which she states improves with inhaler use.  She continues to smoke 1 pack/day.   Past Medical History:  Diagnosis Date  . Alcohol abuse, unspecified   . Breast cancer ([place]) 1998   Right  . Cataract    left eye  . Cervical spondylosis 2006   MRI  . Degeneration of cervical intervertebral disc 2006   MRI  . Diverticulosis of colon (without mention of hemorrhage)   . Hyperpotassemia   . Microscopic hematuria   . Mononeuritis of unspecified site   . Nonspecific abnormal results of liver function study   . Other abnormal glucose   . Other and unspecified hyperlipidemia    no per pt  . Other chronic nonalcoholic liver disease   . Personal history of chemotherapy   . Personal history of malignant neoplasm of breast   . Personal history of radiation therapy   . Pneumothorax, acute    right, spontaneous  . Tobacco use disorder   . Unspecified vitamin D deficiency     Past Surgical History:  Procedure Laterality Date  . BREAST BIOPSY  9/03   Right  . BREAST LUMPECTOMY Right 1998  . BREAST  LUMPECTOMY  1998  . CHEST TUBE INSERTION  11/02/2014  . COLONOSCOPY    . ESOPHAGOGASTRODUODENOSCOPY (EGD) WITH PROPOFOL N/A 10/30/2018   Procedure: ESOPHAGOGASTRODUODENOSCOPY (EGD) WITH PROPOFOL;  Surgeon: Mauri Pole, MD;  Location: [facility];  Service: Endoscopy;  Laterality: N/A;  . EYE SURGERY  02/2017   cataract extraction with lens implant  . TUBAL LIGATION      Current Medications: Current Meds  Medication Sig  . acetaminophen (TYLENOL) 500 MG tablet Take 500 mg by mouth 2 (two) times a day. Uses only as needed  . albuterol (VENTOLIN HFA) 108 (90 Base) MCG/ACT inhaler Inhale 2 puffs into the lungs every 4 (four) hours as needed for wheezing or shortness of breath.  . doxylamine, Sleep, (UNISOM) 25 MG tablet Take 25 mg by mouth at bedtime as needed for sleep.   . furosemide (LASIX) 20 MG tablet Take 20 mg by mouth daily.   Marland Kitchen lactulose (CHRONULAC) 10 GM/15ML solution Take 15 mLs (10 g total) by mouth 3 (three) times daily. (Patient taking differently: Take 20 g by mouth See admin instructions. 2 tablespoons daily)  . metFORMIN (GLUCOPHAGE XR) 500 MG 24 hr tablet Take 1 tablet (500 mg total) by mouth daily with breakfast.  . Multiple Vitamin (MULTIVITAMIN) tablet Take 1 tablet by mouth daily.  . nadolol (CORGARD) 20 MG tablet Take 1 tablet (20  mg total) by mouth daily.  . pantoprazole (PROTONIX) 40 MG tablet Take 1 tablet (40 mg total) by mouth 2 (two) times daily before a meal. (Patient taking differently: Take 40 mg by mouth daily. )  . Polyethyl Glycol-Propyl Glycol (SYSTANE OP) Place 1 drop into the left eye daily as needed (dryness).  Marland Kitchen spironolactone (ALDACTONE) 25 MG tablet Take 25 mg by mouth daily.     Allergies:   Oxycodone and Glipizide   Social History   Socioeconomic History  . Marital status: Single    Spouse name: Not on file  . Number of children: 1  . Years of education: Not on file  . Highest education level: Not on file  Occupational History  .  Occupation: retired    Fish farm manager: [employer]  Social Needs  . Financial resource strain: Not hard at all  . Food insecurity    Worry: Never true    Inability: Never true  . Transportation needs    Medical: No    Non-medical: No  Tobacco Use  . Smoking status: Current Every Day Smoker    Packs/day: 1.00    Years: 53.00    Pack years: 53.00    Types: Cigarettes  . Smokeless tobacco: Never Used  . Tobacco comment: tobacco info given 04/27/2018  Substance and Sexual Activity  . Alcohol use: Not Currently    Comment: occasional use  . Drug use: No  . Sexual activity: Not Currently  Lifestyle  . Physical activity    Days per week: 0 days    Minutes per session: 0 min  . Stress: Only a little  Relationships  . Social Herbalist on phone: Not on file    Gets together: Not on file    Attends religious service: Not on file    Active member of club or organization: Not on file    Attends meetings of clubs or organizations: Not on file    Relationship status: Not on file  Other Topics Concern  . Not on file  Social History Narrative   Divorced      1 child      Works at [employer] History: The patient's \ family history includes Colon cancer in her maternal uncle; Heart attack in her father; Heart failure in her father; Stroke in her mother. There is no history of Esophageal cancer, Rectal cancer, or Stomach cancer.  ROS:   Please see the history of present illness.    \All other systems reviewed and are negative.  EKGs/Labs/Other Studies Reviewed:    The following studies were reviewed today:   EKG:  EKG is ordered today.  The ekg ordered today demonstrates normal sinus rhythm, rate 66, no ST/T abnormalities  Recent Labs: 10/23/2018: TSH 1.95 02/12/2019: ALT 11; BUN 18; Creatinine, Ser 0.75; Hemoglobin 12.0; Platelets 135.0; Potassium 4.8; Sodium 132  Recent Lipid Panel    Component Value Date/Time   CHOL 128 11/06/2018 0839    TRIG 125.0 11/06/2018 0839   HDL 32.60 (L) 11/06/2018 0839   CHOLHDL 4 11/06/2018 0839   VLDL 25.0 11/06/2018 0839   LDLCALC 70 11/06/2018 0839   LDLDIRECT 83.0 10/31/2017 0838    Physical Exam:    VS:  BP (!) 126/58 (BP Location: Left Arm, Patient Position: Sitting, Cuff Size: Normal)   Pulse 66   Temp (!) 97.3 F (36.3 C)   Ht 5' 4"  (1.626 m)  Wt 100 lb (45.4 kg)   BMI 17.16 kg/m     Wt Readings from Last 3 Encounters:  02/13/19 100 lb (45.4 kg)  02/12/19 101 lb 8 oz (46 kg)  02/07/19 104 lb (47.2 kg)     GEN: Cachectic, in no acute distress HEENT: Normal NECK: No JVD\ LYMPHATICS: No lymphadenopathy CARDIAC: RRR, no murmurs\ RESPIRATORY:  Clear to auscultation without rales, wheezing or rhonchi  ABDOMEN: Soft, non-tender, non-distended MUSCULOSKELETAL:  No edema\ SKIN: Warm and dry NEUROLOGIC:  Alert and oriented x 3 PSYCHIATRIC:  Normal affect   ASSESSMENT:    1. SOB (shortness of breath)   2. Coronary artery disease involving native coronary artery of native heart without angina pectoris   3. Type 2 diabetes mellitus without complication, without long-term current use of insulin (HCC)    PLAN:    In order of problems listed above:  Coronary artery disease: CT chest showed multivessel coronary calcifications.  She denies any chest pain. -Ideally would start on a statin.  Last LDL 70 on 11/06/2018.  She appears compensated in regards to her cirrhosis.  Will discuss with Dr. [name], and if OK with it will start on rosuvastatin 5 mg daily and check LFTs in 1 month -Would not start on daily aspirin given ongoing issues with bleeding from esophageal varices  Dyspnea: Suspect related to COPD, as improves with inhaler use.  Will check TTE to rule out any structural heart disease  Type 2 diabetes: Well-controlled, last A1c 6.7 on 02/07/2019  RTC in 3 months  Medication Adjustments/Labs and Tests Ordered: Current medicines are reviewed at length with the  patient today.  Concerns regarding medicines are outlined above.  Orders Placed This Encounter  Procedures  . EKG 12-Lead  . ECHOCARDIOGRAM COMPLETE   No orders of the defined types were placed in this encounter.   Patient Instructions  Medication Instructions:  Your physician recommends that you continue on your current medications as directed. Please refer to the Current Medication list given to you today.  *If you need a refill on your cardiac medications before your next appointment, please call your pharmacy*  Lab Work: NONE   Testing/Procedures: Your physician has requested that you have an echocardiogram. Echocardiography is a painless test that uses sound waves to create images of your heart. It provides your doctor with information about the size and shape of your heart and how well your heart's chambers and valves are working. This procedure takes approximately one hour. There are no restrictions for this procedure. [address]  Follow-Up: At [facility], you and your health needs are our priority.  As part of our continuing mission to provide you with exceptional heart care, we have created designated Provider Care Teams.  These Care Teams include your primary Cardiologist (physician) and Advanced Practice Providers (APPs -  Physician Assistants and Nurse Practitioners) who all work together to provide you with the care you need, when you need it.  Your next appointment:   3 months  The format for your next appointment:   In Person  Provider:   You may see DR Gardiner Rhyme or one of the following Advanced Practice Providers on your designated Care Team:    Rosaria Ferries, PA-C  Jory Sims, DNP, ANP  Cadence Kathlen Mody, NP   Other Instructions   Echocardiogram An echocardiogram is a procedure that uses painless sound waves (ultrasound) to produce an image of the heart. Images from an echocardiogram can provide  important information  about:  Signs of coronary artery disease (CAD).  Aneurysm detection. An aneurysm is a weak or damaged part of an artery wall that bulges out from the normal force of blood pumping through the body.  Heart size and shape. Changes in the size or shape of the heart can be associated with certain conditions, including heart failure, aneurysm, and CAD.  Heart muscle function.  Heart valve function.  Signs of a past heart attack.  Fluid buildup around the heart.  Thickening of the heart muscle.  A tumor or infectious growth around the heart valves. Tell a health care provider about:  Any allergies you have.  All medicines you are taking, including vitamins, herbs, eye drops, creams, and over-the-counter medicines.  Any blood disorders you have.  Any surgeries you have had.  Any medical conditions you have.  Whether you are pregnant or may be pregnant. What are the risks? Generally, this is a safe procedure. However, problems may occur, including:  Allergic reaction to dye (contrast) that may be used during the procedure. What happens before the procedure? No specific preparation is needed. You may eat and drink normally. What happens during the procedure?   An IV tube may be inserted into one of your veins.  You may receive contrast through this tube. A contrast is an injection that improves the quality of the pictures from your heart.  A gel will be applied to your chest.  A wand-like tool (transducer) will be moved over your chest. The gel will help to transmit the sound waves from the transducer.  The sound waves will harmlessly bounce off of your heart to allow the heart images to be captured in real-time motion. The images will be recorded on a computer. The procedure may vary among health care providers and hospitals. What happens after the procedure?  You may return to your normal, everyday life, including diet, activities, and medicines, unless your health care  provider tells you not to do that. Summary  An echocardiogram is a procedure that uses painless sound waves (ultrasound) to produce an image of the heart.  Images from an echocardiogram can provide important information about the size and shape of your heart, heart muscle function, heart valve function, and fluid buildup around your heart.  You do not need to do anything to prepare before this procedure. You may eat and drink normally.  After the echocardiogram is completed, you may return to your normal, everyday life, unless your health care provider tells you not to do that. This information is not intended to replace advice given to you by your health care provider. Make sure you discuss any questions you have with your health care provider. Document Released: 03/26/2000 Document Revised: 07/20/2018 Document Reviewed: 05/01/2016 Elsevier Patient Education  2020 [place], Donato Heinz, MD  02/13/2019 10:59 AM    [place]

## 2019-02-13 NOTE — Patient Instructions (Signed)
Medication Instructions:  Your physician recommends that you continue on your current medications as directed. Please refer to the Current Medication list given to you today.  *If you need a refill on your cardiac medications before your next appointment, please call your pharmacy*  Lab Work: NONE   Testing/Procedures: Your physician has requested that you have an echocardiogram. Echocardiography is a painless test that uses sound waves to create images of your heart. It provides your doctor with information about the size and shape of your heart and how well your heart's chambers and valves are working. This procedure takes approximately one hour. There are no restrictions for this procedure. [place] [address]  Follow-Up: At [facility], you and your health needs are our priority.  As part of our continuing mission to provide you with exceptional heart care, we have created designated Provider Care Teams.  These Care Teams include your primary Cardiologist (physician) and Advanced Practice Providers (APPs -  Physician Assistants and Nurse Practitioners) who all work together to provide you with the care you need, when you need it.  Your next appointment:   3 months  The format for your next appointment:   In Person  Provider:   You may see DR Gardiner Rhyme or one of the following Advanced Practice Providers on your designated Care Team:    Rosaria Ferries, PA-C  Jory Sims, DNP, ANP  Cadence Kathlen Mody, NP   Other Instructions   Echocardiogram An echocardiogram is a procedure that uses painless sound waves (ultrasound) to produce an image of the heart. Images from an echocardiogram can provide important information about:  Signs of coronary artery disease (CAD).  Aneurysm detection. An aneurysm is a weak or damaged part of an artery wall that bulges out from the normal force of blood pumping through the body.  Heart size and shape. Changes in the size  or shape of the heart can be associated with certain conditions, including heart failure, aneurysm, and CAD.  Heart muscle function.  Heart valve function.  Signs of a past heart attack.  Fluid buildup around the heart.  Thickening of the heart muscle.  A tumor or infectious growth around the heart valves. Tell a health care provider about:  Any allergies you have.  All medicines you are taking, including vitamins, herbs, eye drops, creams, and over-the-counter medicines.  Any blood disorders you have.  Any surgeries you have had.  Any medical conditions you have.  Whether you are pregnant or may be pregnant. What are the risks? Generally, this is a safe procedure. However, problems may occur, including:  Allergic reaction to dye (contrast) that may be used during the procedure. What happens before the procedure? No specific preparation is needed. You may eat and drink normally. What happens during the procedure?   An IV tube may be inserted into one of your veins.  You may receive contrast through this tube. A contrast is an injection that improves the quality of the pictures from your heart.  A gel will be applied to your chest.  A wand-like tool (transducer) will be moved over your chest. The gel will help to transmit the sound waves from the transducer.  The sound waves will harmlessly bounce off of your heart to allow the heart images to be captured in real-time motion. The images will be recorded on a computer. The procedure may vary among health care providers and hospitals. What happens after the procedure?  You may return  to your normal, everyday life, including diet, activities, and medicines, unless your health care provider tells you not to do that. Summary  An echocardiogram is a procedure that uses painless sound waves (ultrasound) to produce an image of the heart.  Images from an echocardiogram can provide important information about the size and  shape of your heart, heart muscle function, heart valve function, and fluid buildup around your heart.  You do not need to do anything to prepare before this procedure. You may eat and drink normally.  After the echocardiogram is completed, you may return to your normal, everyday life, unless your health care provider tells you not to do that. This information is not intended to replace advice given to you by your health care provider. Make sure you discuss any questions you have with your health care provider. Document Released: 03/26/2000 Document Revised: 07/20/2018 Document Reviewed: 05/01/2016 Elsevier Patient Education  2020 [employer].

## 2019-02-14 DIAGNOSIS — M545 Low back pain: Secondary | ICD-10-CM | POA: Diagnosis not present

## 2019-02-15 ENCOUNTER — Encounter: Payer: Self-pay | Admitting: Gastroenterology

## 2019-02-15 ENCOUNTER — Telehealth: Payer: Self-pay

## 2019-02-15 DIAGNOSIS — R002 Palpitations: Secondary | ICD-10-CM

## 2019-02-15 DIAGNOSIS — Z79899 Other long term (current) drug therapy: Secondary | ICD-10-CM

## 2019-02-15 MED ORDER — ROSUVASTATIN CALCIUM 5 MG PO TABS: 5.0000 mg | ORAL_TABLET | Freq: Every day | ORAL | 11 refills | Status: DC

## 2019-02-15 NOTE — Telephone Encounter (Signed)
Called pt to inform MD would like for her to start Rosuvastatin 5 mg daily and check LFT in 4 weeks. Pt agreeable to plan and orders placed.

## 2019-02-19 DIAGNOSIS — M5136 Other intervertebral disc degeneration, lumbar region: Secondary | ICD-10-CM | POA: Diagnosis not present

## 2019-02-20 ENCOUNTER — Other Ambulatory Visit: Payer: Self-pay

## 2019-02-20 ENCOUNTER — Other Ambulatory Visit (INDEPENDENT_AMBULATORY_CARE_PROVIDER_SITE_OTHER): Payer: Medicare HMO

## 2019-02-20 DIAGNOSIS — D509 Iron deficiency anemia, unspecified: Secondary | ICD-10-CM | POA: Diagnosis not present

## 2019-02-20 LAB — BASIC METABOLIC PANEL
BUN: 11 mg/dL (ref 6–23)
CO2: 24 mEq/L (ref 19–32)
Calcium: 9.9 mg/dL (ref 8.4–10.5)
Chloride: 96 mEq/L (ref 96–112)
Creatinine, Ser: 0.67 mg/dL (ref 0.40–1.20)
GFR: 87.48 mL/min (ref >60.00)
Glucose, Bld: 204 mg/dL — ABNORMAL HIGH (ref 70–99)
Potassium: 5.3 mEq/L — ABNORMAL HIGH (ref 3.5–5.1)
Sodium: 127 mEq/L — ABNORMAL LOW (ref 135–145)

## 2019-02-20 MED ORDER — XIFAXAN 550 MG PO TABS: 550.0000 mg | ORAL_TABLET | Freq: Two times a day (BID) | ORAL | 3 refills | Status: DC

## 2019-02-21 ENCOUNTER — Other Ambulatory Visit: Payer: Self-pay

## 2019-02-21 ENCOUNTER — Ambulatory Visit (HOSPITAL_COMMUNITY): Payer: Medicare HMO | Attending: Cardiovascular Disease

## 2019-02-21 ENCOUNTER — Telehealth: Payer: Self-pay | Admitting: Gastroenterology

## 2019-02-21 DIAGNOSIS — R0602 Shortness of breath: Secondary | ICD-10-CM

## 2019-02-21 DIAGNOSIS — K7031 Alcoholic cirrhosis of liver with ascites: Secondary | ICD-10-CM

## 2019-02-21 NOTE — Telephone Encounter (Signed)
Spoke with the patient. Reviewed her results and the instructions. Questions invited and answered.

## 2019-02-23 ENCOUNTER — Encounter: Payer: Self-pay | Admitting: Family Medicine

## 2019-02-23 ENCOUNTER — Other Ambulatory Visit: Payer: Self-pay

## 2019-02-23 ENCOUNTER — Ambulatory Visit (INDEPENDENT_AMBULATORY_CARE_PROVIDER_SITE_OTHER): Payer: Medicare HMO | Admitting: Family Medicine

## 2019-02-23 ENCOUNTER — Telehealth: Payer: Self-pay

## 2019-02-23 DIAGNOSIS — B009 Herpesviral infection, unspecified: Secondary | ICD-10-CM | POA: Insufficient documentation

## 2019-02-23 DIAGNOSIS — N898 Other specified noninflammatory disorders of vagina: Secondary | ICD-10-CM | POA: Insufficient documentation

## 2019-02-23 LAB — POCT WET PREP (WET MOUNT): Trichomonas Wet Prep HPF POC: ABSENT

## 2019-02-23 MED ORDER — VALACYCLOVIR HCL 500 MG PO TABS: 500.0000 mg | ORAL_TABLET | Freq: Two times a day (BID) | ORAL | 0 refills | Status: DC

## 2019-02-23 MED ORDER — METHOCARBAMOL 500 MG PO TABS: 500.0000 mg | ORAL_TABLET | Freq: Three times a day (TID) | ORAL | 0 refills | Status: DC | PRN

## 2019-02-23 NOTE — Assessment & Plan Note (Signed)
Itching /burning sensation-pt states this correlates with past hsv outbreaks  No rash or skin change on exam (most of symptoms are in mons pubis area)-puzzling Wet prep is normal  Will tx with valcyclovir 500 bid for 7d  Update if not starting to improve in a week or if worsening   inst her to watch closely for skin changes

## 2019-02-23 NOTE — Patient Instructions (Signed)
I do not see anything on your exam in terms of skin changes  Look in the mirror and watch the area closely  I px valtrex (for a herpes outbreak)- take it orally twice daily for a week  Let us know if no improvement   Watch for blisters/redness/peeling/swelling of the skin  And let us know if you develop internal vaginal symptoms

## 2019-02-23 NOTE — Progress Notes (Signed)
Subjective:    Patient ID: Christine Barton, female    DOB: 19-Sep-1950, 68 y.o.   MRN: [id number]  HPI A few weeks ago had a herpes outbreak  Used a topical medication  mucopirocin   Did not look at the area at all   Used to be on oral medicine for HSV  This is the first outbreak since 2018   Not sexually active   No d/c  Had itching and then burning  All external  Also some irritation around anal area   No yeast medicines  No cramping at all   Does not use wipes   Uses a feminine wash for bathing- for years/no change in that   Nl wet prep today Results for orders placed or performed in visit on 02/23/19  POCT Wet Prep [facility])  Result Value Ref Range   Source Wet Prep POC vaginal    WBC, Wet Prep HPF POC none    Bacteria Wet Prep HPF POC Few Few   BACTERIA WET PREP MORPHOLOGY POC     Clue Cells Wet Prep HPF POC None None   Clue Cells Wet Prep [place]iff POC     Yeast Wet Prep HPF POC None None   KOH Wet Prep POC None None   Trichomonas Wet Prep HPF POC Absent Absent     Patient Active Problem List   Diagnosis Date Noted  . Itching in the vaginal area 02/23/2019  . HSV infection 02/23/2019  . Aortic atherosclerosis ([place]) 01/22/2019  . Coronary atherosclerosis 01/22/2019  . Encounter for screening for lung cancer 01/04/2019  . Low back pain 12/07/2018  . Venous insufficiency 11/15/2018  . Hyponatremia 11/07/2018  . Elevated serum creatinine 11/07/2018  . Esophageal varices without bleeding ([place]) 10/27/2018  . Anemia 10/23/2018  . Fatigue 10/23/2018  . Poor balance 05/30/2018  . Falls 05/30/2018  . Generalized weakness 05/30/2018  . Cirrhosis of liver ([place]) 04/24/2018  . Ascites 04/24/2018  . Gallstones 04/24/2018  . Heme positive stool 04/24/2018  . Screening mammogram, encounter for 11/07/2017  . Smoker 11/07/2017  . Screening examination for STD (sexually transmitted disease) 11/11/2016  . Welcome to Medicare preventive visit 10/26/2016  . Estrogen  deficiency 10/26/2016  . Colon cancer screening 11/01/2014  . Elevated liver enzymes 11/01/2014  . Encounter for routine gynecological examination 09/12/2013  . Rapid heart beat 10/10/2012  . Heartburn 08/20/2011  . Routine general medical examination at a health care facility 07/21/2011  . Vitamin D deficiency 08/04/2009  . POSTMENOPAUSAL STATUS 08/04/2009  . Controlled diabetes mellitus type 2 with complications ([place]) 46/56/8127  . Hyperlipidemia associated with type 2 diabetes mellitus ([place]) 06/14/2008  . [address] DISEASE, CERVICAL 03/30/2007  . History of alcohol abuse 12/06/2006  . Neuropathy of both feet 12/06/2006  . DIVERTICULOSIS, COLON 12/06/2006  . Fatty liver 12/06/2006  . BREAST CANCER, HX OF 12/06/2006   Past Medical History:  Diagnosis Date  . Alcohol abuse, unspecified   . Breast cancer ([place]) 1998   Right  . Cataract    left eye  . Cervical spondylosis 2006   MRI  . Degeneration of cervical intervertebral disc 2006   MRI  . Diverticulosis of colon (without mention of hemorrhage)   . Hyperpotassemia   . Microscopic hematuria   . Mononeuritis of unspecified site   . Nonspecific abnormal results of liver function study   . Other abnormal glucose   . Other and unspecified hyperlipidemia    no per pt  .  Other chronic nonalcoholic liver disease   . Personal history of chemotherapy   . Personal history of malignant neoplasm of breast   . Personal history of radiation therapy   . Pneumothorax, acute    right, spontaneous  . Tobacco use disorder   . Unspecified vitamin D deficiency    Past Surgical History:  Procedure Laterality Date  . BREAST BIOPSY  9/03   Right  . BREAST LUMPECTOMY Right 1998  . BREAST LUMPECTOMY  1998  . CHEST TUBE INSERTION  11/02/2014  . COLONOSCOPY    . ESOPHAGOGASTRODUODENOSCOPY (EGD) WITH PROPOFOL N/A 10/30/2018   Procedure: ESOPHAGOGASTRODUODENOSCOPY (EGD) WITH PROPOFOL;  Surgeon: Mauri Pole, MD;  Location: [facility];   Service: Endoscopy;  Laterality: N/A;  . EYE SURGERY  02/2017   cataract extraction with lens implant  . TUBAL LIGATION     Social History   Tobacco Use  . Smoking status: Current Every Day Smoker    Packs/day: 1.00    Years: 53.00    Pack years: 53.00    Types: Cigarettes  . Smokeless tobacco: Never Used  . Tobacco comment: tobacco info given 04/27/2018  Substance Use Topics  . Alcohol use: Not Currently    Comment: occasional use  . Drug use: No   Family History  Problem Relation Age of Onset  . Heart failure Father   . Heart attack Father   . Colon cancer Maternal Uncle   . Stroke Mother   . Esophageal cancer Neg Hx   . Rectal cancer Neg Hx   . Stomach cancer Neg Hx    Allergies  Allergen Reactions  . Oxycodone Nausea And Vomiting  . Glipizide Other (See Comments)    Stomach pain   Current Outpatient Medications on File Prior to Visit  Medication Sig Dispense Refill  . acetaminophen (TYLENOL) 500 MG tablet Take 500 mg by mouth 2 (two) times a day. Uses only as needed    . albuterol (VENTOLIN HFA) 108 (90 Base) MCG/ACT inhaler Inhale 2 puffs into the lungs every 4 (four) hours as needed for wheezing or shortness of breath. 18 g 3  . doxylamine, Sleep, (UNISOM) 25 MG tablet Take 25 mg by mouth at bedtime as needed for sleep.     Marland Kitchen lactulose (CHRONULAC) 10 GM/15ML solution Take 15 mLs (10 g total) by mouth 3 (three) times daily. (Patient taking differently: Take 20 g by mouth See admin instructions. 2 tablespoons daily) 236 mL 11  . metFORMIN (GLUCOPHAGE XR) 500 MG 24 hr tablet Take 1 tablet (500 mg total) by mouth daily with breakfast. 90 tablet 3  . Multiple Vitamin (MULTIVITAMIN) tablet Take 1 tablet by mouth daily.    . nadolol (CORGARD) 20 MG tablet Take 1 tablet (20 mg total) by mouth daily. 30 tablet 11  . pantoprazole (PROTONIX) 40 MG tablet Take 1 tablet (40 mg total) by mouth 2 (two) times daily before a meal. (Patient taking differently: Take 40 mg by mouth  daily. ) 60 tablet 3  . Polyethyl Glycol-Propyl Glycol (SYSTANE OP) Place 1 drop into the left eye daily as needed (dryness).    . rosuvastatin (CRESTOR) 5 MG tablet Take 1 tablet (5 mg total) by mouth daily at 6 PM. 30 tablet 11  . XIFAXAN 550 MG TABS tablet Take 1 tablet (550 mg total) by mouth 2 (two) times daily. (Patient not taking: Reported on 02/23/2019) 90 tablet 3   No current facility-administered medications on file prior to visit.  Review of Systems  Constitutional: Negative for activity change, appetite change, fatigue, fever and unexpected weight change.  HENT: Negative for congestion, ear pain, rhinorrhea, sinus pressure and sore throat.   Eyes: Negative for pain, redness and visual disturbance.  Respiratory: Negative for cough, shortness of breath and wheezing.   Cardiovascular: Negative for chest pain and palpitations.  Gastrointestinal: Negative for abdominal pain, blood in stool, constipation and diarrhea.  Endocrine: Negative for polydipsia and polyuria.  Genitourinary: Negative for dysuria, frequency and urgency.  Musculoskeletal: Positive for back pain. Negative for arthralgias and myalgias.       Back pain- injection helped  Then overdid it several days later  Skin: Negative for pallor and rash.       Itching/burning in pelvic area   Allergic/Immunologic: Negative for environmental allergies.  Neurological: Negative for dizziness, syncope and headaches.  Hematological: Negative for adenopathy. Does not bruise/bleed easily.  Psychiatric/Behavioral: Negative for decreased concentration and dysphoric mood. The patient is not nervous/anxious.        Objective:   Physical Exam Constitutional:      General: She is not in acute distress.    Appearance: Normal appearance. She is not ill-appearing.     Comments: Baseline underweight   HENT:     Mouth/Throat:     Mouth: Mucous membranes are moist.  Eyes:     General: No scleral icterus.       Right eye: No  discharge.        Left eye: No discharge.     Extraocular Movements: Extraocular movements intact.     Conjunctiva/sclera: Conjunctivae normal.     Pupils: Pupils are equal, round, and reactive to light.  Neck:     Musculoskeletal: Normal range of motion.  Cardiovascular:     Rate and Rhythm: Normal rate and regular rhythm.  Abdominal:     General: Abdomen is flat. Bowel sounds are normal. There is no distension.     Palpations: Abdomen is soft. There is no mass.     Tenderness: There is no abdominal tenderness.     Hernia: No hernia is present.  Genitourinary:    Comments: Nl appearing vaginal mucosa  Wet prep done  Skin:    Coloration: Skin is not pale.     Findings: No erythema or rash.     Comments: No skin changes seen in pelvic/vulvar/anal areas  No excoriation  Per pt- most discomfort is in mons pubis area    Neurological:     Mental Status: She is alert.  Psychiatric:        Mood and Affect: Mood normal.           Assessment & Plan:   Problem List Items Addressed This Visit      Musculoskeletal and Integument   Itching in the vaginal area    Itching /burning sensation-pt states this correlates with past hsv outbreaks  No rash or skin change on exam (most of symptoms are in mons pubis area)-puzzling Wet prep is normal  Will tx with valcyclovir 500 bid for 7d  Update if not starting to improve in a week or if worsening   inst her to watch closely for skin changes       Relevant Orders   POCT Wet Prep [facility]) (Completed)     Other   HSV infection    Per pt since 2016 Seldom has outbreak      Relevant Medications   valACYclovir (VALTREX) 500 MG tablet

## 2019-02-23 NOTE — Telephone Encounter (Signed)
Pt notified of Dr. Tower's comments and instructions and verbalized understanding  

## 2019-02-23 NOTE — Telephone Encounter (Signed)
That drug is not recommended with liver problems  I qued up generic robaxin  If ok with her send to preferred pharmacy

## 2019-02-23 NOTE — Telephone Encounter (Signed)
Patient called and states that Dr Glori Bickers told her today to check with [facility] Dr Patrice Paradise in regards to her Tizanidine RX that was sent in by them to make sure this is safe with her liver to take. Their office said they could not say/did not know because they do not know what kind of liver issues patient has and patient was told to ask Dr Glori Bickers. Please review.

## 2019-02-23 NOTE — Assessment & Plan Note (Signed)
Per pt since 2016 Seldom has outbreak

## 2019-02-26 ENCOUNTER — Other Ambulatory Visit (INDEPENDENT_AMBULATORY_CARE_PROVIDER_SITE_OTHER): Payer: Medicare HMO

## 2019-02-26 DIAGNOSIS — K7031 Alcoholic cirrhosis of liver with ascites: Secondary | ICD-10-CM

## 2019-02-26 LAB — BASIC METABOLIC PANEL
BUN: 11 mg/dL (ref 6–23)
CO2: 24 mEq/L (ref 19–32)
Calcium: 9.9 mg/dL (ref 8.4–10.5)
Chloride: 102 mEq/L (ref 96–112)
Creatinine, Ser: 0.71 mg/dL (ref 0.40–1.20)
GFR: 81.81 mL/min (ref >60.00)
Glucose, Bld: 164 mg/dL — ABNORMAL HIGH (ref 70–99)
Potassium: 4.6 mEq/L (ref 3.5–5.1)
Sodium: 134 mEq/L — ABNORMAL LOW (ref 135–145)

## 2019-02-27 DIAGNOSIS — M545 Low back pain: Secondary | ICD-10-CM | POA: Diagnosis not present

## 2019-03-02 ENCOUNTER — Telehealth: Payer: Self-pay | Admitting: Gastroenterology

## 2019-03-02 DIAGNOSIS — M545 Low back pain: Secondary | ICD-10-CM | POA: Diagnosis not present

## 2019-03-02 NOTE — Telephone Encounter (Signed)
Spoke with the patient. She has applied for the low income subsidy with the health insurance. She has gotten a new price on the Xifaxan but it is still not something she can do every month. Her new price is $478.00.  She does not know what she is supposed to do with this. [facility] suggested contacting the Patient Assistance program again.

## 2019-03-05 ENCOUNTER — Encounter (HOSPITAL_COMMUNITY): Payer: Self-pay | Admitting: *Deleted

## 2019-03-05 DIAGNOSIS — M5136 Other intervertebral disc degeneration, lumbar region: Secondary | ICD-10-CM | POA: Diagnosis not present

## 2019-03-05 DIAGNOSIS — M542 Cervicalgia: Secondary | ICD-10-CM | POA: Diagnosis not present

## 2019-03-05 DIAGNOSIS — M545 Low back pain: Secondary | ICD-10-CM | POA: Diagnosis not present

## 2019-03-06 ENCOUNTER — Other Ambulatory Visit: Payer: Self-pay

## 2019-03-06 ENCOUNTER — Encounter (HOSPITAL_COMMUNITY): Payer: Self-pay | Admitting: *Deleted

## 2019-03-06 ENCOUNTER — Ambulatory Visit (INDEPENDENT_AMBULATORY_CARE_PROVIDER_SITE_OTHER): Payer: Medicare HMO | Admitting: Family Medicine

## 2019-03-06 ENCOUNTER — Encounter: Payer: Self-pay | Admitting: Family Medicine

## 2019-03-06 VITALS — BP 136/68 | HR 64 | Temp 96.8°F | Ht 64.0 in | Wt 103.2 lb

## 2019-03-06 DIAGNOSIS — N898 Other specified noninflammatory disorders of vagina: Secondary | ICD-10-CM | POA: Diagnosis not present

## 2019-03-06 DIAGNOSIS — F17200 Nicotine dependence, unspecified, uncomplicated: Secondary | ICD-10-CM

## 2019-03-06 DIAGNOSIS — M7989 Other specified soft tissue disorders: Secondary | ICD-10-CM | POA: Diagnosis not present

## 2019-03-06 MED ORDER — VALACYCLOVIR HCL 500 MG PO TABS: 500.0000 mg | ORAL_TABLET | Freq: Two times a day (BID) | ORAL | 0 refills | Status: DC

## 2019-03-06 NOTE — Patient Instructions (Signed)
Our office will call you regarding referral to ear/nose/throat practice for mass in the lip/face   I'm not convinced the skin problem in the pelvic area is herpes but since the valtrex is helping we can repeat it  If not gone in a week please call us so we can set you up with a gynecologist   Take care of yourself

## 2019-03-06 NOTE — Progress Notes (Signed)
Subjective:    Patient ID: Christine Barton, female    DOB: [birth year], 68 y.o.   MRN: [id number]  This visit occurred during the SARS-CoV-2 public health emergency.  Safety protocols were in place, including screening questions prior to the visit, additional usage of staff PPE, and extensive cleaning of exam room while observing appropriate contact time as indicated for disinfecting solutions.    HPI Pt presents for a knot in her jaw and also f/u of external genitalia problem (presumed HSV)  Last visit fairly nl exam with nl wet prep  Given valtrex for poss HSV outbreak   Took 7 days of the valtrex  Improved - but very slowly   Cannot see well enough to see blisters Still occurring on the skin - in the mons pubic area   Still experiencing  Pain  - is improved  Burning  sens of the skin  Itching - this is much better today  No redness   Has not been sexually active/does not suspect a new STD   Has a knot in her jaw -hard / near R corner of her mouth  Over a week  Is not painful   Has dentures so does not have a dentist  ? If any teeth that broke or did not erupt    Smoking - the same   Last hep B shot was 5/26    Wt Readings from Last 3 Encounters:  03/06/19 103 lb 4 oz (46.8 kg)  02/23/19 101 lb 2 oz (45.9 kg)  02/13/19 100 lb (45.4 kg)   17.72 kg/m   Patient Active Problem List   Diagnosis Date Noted  . Mass of soft tissue of face 03/06/2019  . Itching in the vaginal area 02/23/2019  . HSV infection 02/23/2019  . Aortic atherosclerosis ([place]) 01/22/2019  . Coronary atherosclerosis 01/22/2019  . Encounter for screening for lung cancer 01/04/2019  . Low back pain 12/07/2018  . Venous insufficiency 11/15/2018  . Hyponatremia 11/07/2018  . Elevated serum creatinine 11/07/2018  . Esophageal varices without bleeding ([place]) 10/27/2018  . Anemia 10/23/2018  . Fatigue 10/23/2018  . Poor balance 05/30/2018  . Falls 05/30/2018  . Generalized weakness 05/30/2018   . Cirrhosis of liver ([place]) 04/24/2018  . Ascites 04/24/2018  . Gallstones 04/24/2018  . Heme positive stool 04/24/2018  . Screening mammogram, encounter for 11/07/2017  . Smoker 11/07/2017  . Screening examination for STD (sexually transmitted disease) 11/11/2016  . Welcome to Medicare preventive visit 10/26/2016  . Estrogen deficiency 10/26/2016  . Colon cancer screening 11/01/2014  . Elevated liver enzymes 11/01/2014  . Encounter for routine gynecological examination 09/12/2013  . Rapid heart beat 10/10/2012  . Heartburn 08/20/2011  . Routine general medical examination at a health care facility 07/21/2011  . Vitamin D deficiency 08/04/2009  . POSTMENOPAUSAL STATUS 08/04/2009  . Controlled diabetes mellitus type 2 with complications ([place]) 77/93/9030  . Hyperlipidemia associated with type 2 diabetes mellitus ([place]) 06/14/2008  . [place] DISEASE, CERVICAL 03/30/2007  . History of alcohol abuse 12/06/2006  . Neuropathy of both feet 12/06/2006  . DIVERTICULOSIS, COLON 12/06/2006  . Fatty liver 12/06/2006  . BREAST CANCER, HX OF 12/06/2006   Past Medical History:  Diagnosis Date  . Alcohol abuse, unspecified   . Breast cancer ([place]) 1998   Right  . Cataract    left eye  . Cervical spondylosis 2006   MRI  . Degeneration of cervical intervertebral disc 2006   MRI  . Diabetes mellitus without  complication ([place])   . Diverticulosis of colon (without mention of hemorrhage)   . Hyperpotassemia   . Microscopic hematuria   . Mononeuritis of unspecified site   . Nonspecific abnormal results of liver function study   . Other abnormal glucose   . Other and unspecified hyperlipidemia    no per pt  . Other chronic nonalcoholic liver disease   . Personal history of chemotherapy   . Personal history of malignant neoplasm of breast   . Personal history of radiation therapy   . Pneumothorax, acute    right, spontaneous  . Tobacco use disorder   . Unspecified vitamin D deficiency    Past  Surgical History:  Procedure Laterality Date  . BREAST BIOPSY  9/03   Right  . BREAST LUMPECTOMY Right 1998  . BREAST LUMPECTOMY  1998  . CHEST TUBE INSERTION  11/02/2014  . COLONOSCOPY    . ESOPHAGOGASTRODUODENOSCOPY (EGD) WITH PROPOFOL N/A 10/30/2018   Procedure: ESOPHAGOGASTRODUODENOSCOPY (EGD) WITH PROPOFOL;  Surgeon: Mauri Pole, MD;  Location: [facility];  Service: Endoscopy;  Laterality: N/A;  . EYE SURGERY  02/2017   cataract extraction with lens implant-left  . TUBAL LIGATION     Social History   Tobacco Use  . Smoking status: Current Every Day Smoker    Packs/day: 1.00    Years: 53.00    Pack years: 53.00    Types: Cigarettes  . Smokeless tobacco: Never Used  . Tobacco comment: tobacco info given 04/27/2018  Substance Use Topics  . Alcohol use: Not Currently    Comment: occasional use  . Drug use: No   Family History  Problem Relation Age of Onset  . Heart failure Father   . Heart attack Father   . Colon cancer Maternal Uncle   . Stroke Mother   . Esophageal cancer Neg Hx   . Rectal cancer Neg Hx   . Stomach cancer Neg Hx    Allergies  Allergen Reactions  . Oxycodone Nausea And Vomiting  . Glipizide Other (See Comments)    Stomach pain   Current Outpatient Medications on File Prior to Visit  Medication Sig Dispense Refill  . acetaminophen (TYLENOL) 500 MG tablet Take 500 mg by mouth 2 (two) times a day. Uses only as needed    . albuterol (VENTOLIN HFA) 108 (90 Base) MCG/ACT inhaler Inhale 2 puffs into the lungs every 4 (four) hours as needed for wheezing or shortness of breath. 18 g 3  . doxylamine, Sleep, (UNISOM) 25 MG tablet Take 25 mg by mouth at bedtime as needed for sleep.     Marland Kitchen lactulose (CHRONULAC) 10 GM/15ML solution Take 15 mLs (10 g total) by mouth 3 (three) times daily. (Patient taking differently: Take 20 g by mouth See admin instructions. 2 tablespoons daily) 236 mL 11  . metFORMIN (GLUCOPHAGE XR) 500 MG 24 hr tablet Take 1 tablet (500  mg total) by mouth daily with breakfast. 90 tablet 3  . methocarbamol (ROBAXIN) 500 MG tablet Take 1 tablet (500 mg total) by mouth every 8 (eight) hours as needed for muscle spasms. Caution of sedation 30 tablet 0  . Multiple Vitamin (MULTIVITAMIN) tablet Take 1 tablet by mouth daily.    . nadolol (CORGARD) 20 MG tablet Take 1 tablet (20 mg total) by mouth daily. 30 tablet 11  . pantoprazole (PROTONIX) 40 MG tablet Take 1 tablet (40 mg total) by mouth 2 (two) times daily before a meal. (Patient taking differently: Take 40 mg by mouth  daily. ) 60 tablet 3  . Polyethyl Glycol-Propyl Glycol (SYSTANE OP) Place 1 drop into the left eye daily as needed (dryness).    . rosuvastatin (CRESTOR) 5 MG tablet Take 1 tablet (5 mg total) by mouth daily at 6 PM. 30 tablet 11  . traMADol (ULTRAM) 50 MG tablet Take 50 mg by mouth daily as needed. At bedtime as needed    . XIFAXAN 550 MG TABS tablet Take 1 tablet (550 mg total) by mouth 2 (two) times daily. (Patient not taking: Reported on 03/06/2019) 90 tablet 3   No current facility-administered medications on file prior to visit.      Review of Systems  Constitutional: Negative for activity change, appetite change, fatigue, fever and unexpected weight change.  HENT: Negative for congestion, ear pain, rhinorrhea, sinus pressure and sore throat.   Eyes: Negative for pain, redness and visual disturbance.  Respiratory: Negative for cough, shortness of breath and wheezing.        Chronic smoker's cough  Cardiovascular: Negative for chest pain and palpitations.  Gastrointestinal: Negative for abdominal pain, blood in stool, constipation and diarrhea.  Endocrine: Negative for polydipsia and polyuria.  Genitourinary: Negative for dysuria, frequency and urgency.  Musculoskeletal: Negative for arthralgias, back pain and myalgias.  Skin: Negative for pallor and rash.       Lump in jaw area  Itching of skin in pelvic area  Allergic/Immunologic: Negative for  environmental allergies.  Neurological: Negative for dizziness, syncope and headaches.  Hematological: Negative for adenopathy. Does not bruise/bleed easily.  Psychiatric/Behavioral: Negative for decreased concentration and dysphoric mood. The patient is not nervous/anxious.        Objective:   Physical Exam Constitutional:      General: She is not in acute distress.    Appearance: Normal appearance. She is not ill-appearing.     Comments: Underweight and frail appearing  HENT:     Head: Normocephalic and atraumatic.     Mouth/Throat:     Mouth: Mucous membranes are moist.     Pharynx: Oropharynx is clear. No oropharyngeal exudate or posterior oropharyngeal erythema.     Comments: 1 to 1.5 cm oval mobile nontender mass felt in soft tissue of R buccal mucosa under lip (palpable internally and externally)  Non tender No skin changes  Eyes:     General: No scleral icterus.    Extraocular Movements: Extraocular movements intact.     Pupils: Pupils are equal, round, and reactive to light.  Pulmonary:     Effort: Pulmonary effort is normal. No respiratory distress.     Breath sounds: No stridor. No rhonchi.     Comments: Diffusely distant bs   Abdominal:     General: Abdomen is flat. Bowel sounds are normal. There is no distension.     Palpations: Abdomen is soft. There is no mass.     Tenderness: There is no abdominal tenderness.  Genitourinary:    Comments: Nl appearing ext genitalia  Musculoskeletal:     Right lower leg: No edema.     Left lower leg: No edema.  Skin:    Coloration: Skin is not pale.     Findings: No erythema or rash.     Comments: Nl skin changes at all seen in pelvic or rectal area  Neurological:     Mental Status: She is alert.     Coordination: Coordination normal.     Deep Tendon Reflexes: Reflexes normal.  Psychiatric:        Mood  and Affect: Mood normal.           Assessment & Plan:   Problem List Items Addressed This Visit       Musculoskeletal and Integument   Itching in the vaginal area    Pt continues to c/o itching and burning of skin in perineal and rectal area  No findings on exam  Per pt-improved with valcyclovir  Interestingly no HSV findings  If no further imp in 1 wk with valcyclovir then will ref to gyn        Other   Smoker    Disc in detail risks of smoking and possible outcomes including copd, vascular/ heart disease, cancer , respiratory and sinus infections  Pt voices understanding Pt has copd Not interested in quitting      Relevant Orders   Ambulatory referral to ENT   Mass of soft tissue of face - Primary    1-1.5 oval mobile mass in soft tissue (between lip and chin)  Feels like a cyst , and nt  No s/s of infection  Pt is a smoker and does not use chewing tobacco  Ref to ENT for eval       Relevant Orders   Ambulatory referral to ENT

## 2019-03-06 NOTE — Assessment & Plan Note (Signed)
Disc in detail risks of smoking and possible outcomes including copd, vascular/ heart disease, cancer , respiratory and sinus infections  Pt voices understanding Pt has copd Not interested in quitting

## 2019-03-06 NOTE — Assessment & Plan Note (Signed)
1-1.5 oval mobile mass in soft tissue (between lip and chin)  Feels like a cyst , and nt  No s/s of infection  Pt is a smoker and does not use chewing tobacco  Ref to ENT for eval

## 2019-03-06 NOTE — Assessment & Plan Note (Signed)
Pt continues to c/o itching and burning of skin in perineal and rectal area  No findings on exam  Per pt-improved with valcyclovir  Interestingly no HSV findings  If no further imp in 1 wk with valcyclovir then will ref to gyn

## 2019-03-07 ENCOUNTER — Other Ambulatory Visit (HOSPITAL_COMMUNITY): Payer: Medicare HMO

## 2019-03-07 ENCOUNTER — Other Ambulatory Visit: Payer: Self-pay

## 2019-03-07 DIAGNOSIS — K7031 Alcoholic cirrhosis of liver with ascites: Secondary | ICD-10-CM

## 2019-03-07 DIAGNOSIS — M545 Low back pain: Secondary | ICD-10-CM | POA: Diagnosis not present

## 2019-03-09 ENCOUNTER — Other Ambulatory Visit (HOSPITAL_COMMUNITY): Payer: Medicare HMO

## 2019-03-09 ENCOUNTER — Other Ambulatory Visit (HOSPITAL_COMMUNITY)
Admission: RE | Admit: 2019-03-09 | Discharge: 2019-03-09 | Disposition: A | Discharge status: Home / Self Care | Payer: Medicare HMO | Source: Ambulatory Visit | Attending: Gastroenterology | Admitting: Gastroenterology

## 2019-03-09 DIAGNOSIS — Z01812 Encounter for preprocedural laboratory examination: Secondary | ICD-10-CM | POA: Diagnosis not present

## 2019-03-09 DIAGNOSIS — Z20828 Contact with and (suspected) exposure to other viral communicable diseases: Secondary | ICD-10-CM | POA: Insufficient documentation

## 2019-03-09 LAB — SARS CORONAVIRUS 2 (TAT 6-24 HRS): SARS Coronavirus 2: NEGATIVE

## 2019-03-12 ENCOUNTER — Encounter (HOSPITAL_COMMUNITY): Payer: Self-pay | Admitting: *Deleted

## 2019-03-12 ENCOUNTER — Encounter (HOSPITAL_COMMUNITY)
Admission: RE | Disposition: A | Discharge status: Home / Self Care | Payer: Self-pay | Source: Home / Self Care | Attending: Gastroenterology

## 2019-03-12 ENCOUNTER — Ambulatory Visit (INDEPENDENT_AMBULATORY_CARE_PROVIDER_SITE_OTHER): Payer: Medicare HMO | Admitting: Otolaryngology

## 2019-03-12 ENCOUNTER — Ambulatory Visit (HOSPITAL_COMMUNITY): Payer: Medicare HMO | Admitting: Anesthesiology

## 2019-03-12 ENCOUNTER — Ambulatory Visit (HOSPITAL_COMMUNITY)
Admission: RE | Admit: 2019-03-12 | Discharge: 2019-03-12 | Disposition: A | Discharge status: Home / Self Care | Payer: Medicare HMO | Attending: Gastroenterology | Admitting: Gastroenterology

## 2019-03-12 ENCOUNTER — Other Ambulatory Visit: Payer: Self-pay

## 2019-03-12 DIAGNOSIS — Z20828 Contact with and (suspected) exposure to other viral communicable diseases: Secondary | ICD-10-CM | POA: Diagnosis not present

## 2019-03-12 DIAGNOSIS — Z79899 Other long term (current) drug therapy: Secondary | ICD-10-CM | POA: Insufficient documentation

## 2019-03-12 DIAGNOSIS — K7290 Hepatic failure, unspecified without coma: Secondary | ICD-10-CM | POA: Diagnosis not present

## 2019-03-12 DIAGNOSIS — K922 Gastrointestinal hemorrhage, unspecified: Secondary | ICD-10-CM | POA: Diagnosis not present

## 2019-03-12 DIAGNOSIS — K219 Gastro-esophageal reflux disease without esophagitis: Secondary | ICD-10-CM | POA: Insufficient documentation

## 2019-03-12 DIAGNOSIS — Z853 Personal history of malignant neoplasm of breast: Secondary | ICD-10-CM | POA: Insufficient documentation

## 2019-03-12 DIAGNOSIS — I8510 Secondary esophageal varices without bleeding: Secondary | ICD-10-CM | POA: Insufficient documentation

## 2019-03-12 DIAGNOSIS — K766 Portal hypertension: Secondary | ICD-10-CM | POA: Insufficient documentation

## 2019-03-12 DIAGNOSIS — I2510 Atherosclerotic heart disease of native coronary artery without angina pectoris: Secondary | ICD-10-CM | POA: Insufficient documentation

## 2019-03-12 DIAGNOSIS — I8500 Esophageal varices without bleeding: Secondary | ICD-10-CM | POA: Diagnosis not present

## 2019-03-12 DIAGNOSIS — Z885 Allergy status to narcotic agent status: Secondary | ICD-10-CM | POA: Diagnosis not present

## 2019-03-12 DIAGNOSIS — K3189 Other diseases of stomach and duodenum: Secondary | ICD-10-CM | POA: Diagnosis not present

## 2019-03-12 DIAGNOSIS — R6889 Other general symptoms and signs: Secondary | ICD-10-CM | POA: Diagnosis not present

## 2019-03-12 DIAGNOSIS — K7581 Nonalcoholic steatohepatitis (NASH): Secondary | ICD-10-CM | POA: Diagnosis not present

## 2019-03-12 DIAGNOSIS — K7031 Alcoholic cirrhosis of liver with ascites: Secondary | ICD-10-CM | POA: Insufficient documentation

## 2019-03-12 DIAGNOSIS — Z888 Allergy status to other drugs, medicaments and biological substances status: Secondary | ICD-10-CM | POA: Diagnosis not present

## 2019-03-12 DIAGNOSIS — F17210 Nicotine dependence, cigarettes, uncomplicated: Secondary | ICD-10-CM | POA: Insufficient documentation

## 2019-03-12 DIAGNOSIS — J449 Chronic obstructive pulmonary disease, unspecified: Secondary | ICD-10-CM | POA: Insufficient documentation

## 2019-03-12 DIAGNOSIS — K921 Melena: Secondary | ICD-10-CM

## 2019-03-12 DIAGNOSIS — Z7984 Long term (current) use of oral hypoglycemic drugs: Secondary | ICD-10-CM | POA: Insufficient documentation

## 2019-03-12 DIAGNOSIS — E1142 Type 2 diabetes mellitus with diabetic polyneuropathy: Secondary | ICD-10-CM | POA: Diagnosis not present

## 2019-03-12 DIAGNOSIS — M1990 Unspecified osteoarthritis, unspecified site: Secondary | ICD-10-CM | POA: Insufficient documentation

## 2019-03-12 HISTORY — PX: ESOPHAGOGASTRODUODENOSCOPY (EGD) WITH PROPOFOL: SHX5813

## 2019-03-12 HISTORY — DX: Type 2 diabetes mellitus without complications: E11.9

## 2019-03-12 LAB — GLUCOSE, CAPILLARY: Glucose-Capillary: 98 mg/dL (ref 70–99)

## 2019-03-12 SURGERY — ESOPHAGOGASTRODUODENOSCOPY (EGD) WITH PROPOFOL
Anesthesia: Monitor Anesthesia Care

## 2019-03-12 MED ORDER — LACTATED RINGERS IV SOLN
INTRAVENOUS | Status: DC
  Administered 2019-03-12: 1000 mL via INTRAVENOUS

## 2019-03-12 MED ORDER — PROPOFOL 10 MG/ML IV BOLUS
INTRAVENOUS | Status: DC | PRN
  Administered 2019-03-12: 20 mg via INTRAVENOUS
  Administered 2019-03-12: 50 mg via INTRAVENOUS

## 2019-03-12 MED ORDER — SODIUM CHLORIDE 0.9 % IV SOLN: INTRAVENOUS | Status: DC

## 2019-03-12 MED ORDER — PROPOFOL 500 MG/50ML IV EMUL
INTRAVENOUS | Status: AC
  Filled 2019-03-12: qty 50

## 2019-03-12 MED ORDER — LIDOCAINE 2% (20 MG/ML) 5 ML SYRINGE
INTRAMUSCULAR | Status: DC | PRN
  Administered 2019-03-12: 50 mg via INTRAVENOUS

## 2019-03-12 SURGICAL SUPPLY — 15 items

## 2019-03-12 NOTE — Discharge Instructions (Signed)
YOU HAD AN ENDOSCOPIC PROCEDURE TODAY: Refer to the procedure report and other information in the discharge instructions given to you for any specific questions about what was found during the examination. If this information does not answer your questions, please call [place] office at [phone] to clarify.  ° °YOU SHOULD EXPECT: Some feelings of bloating in the abdomen. Passage of more gas than usual. Walking can help get rid of the air that was put into your GI tract during the procedure and reduce the bloating. If you had a lower endoscopy (such as a colonoscopy or flexible sigmoidoscopy) you may notice spotting of blood in your stool or on the toilet paper. Some abdominal soreness may be present for a day or two, also. ° °DIET: Your first meal following the procedure should be a light meal and then it is ok to progress to your normal diet. A half-sandwich or bowl of soup is an example of a good first meal. Heavy or fried foods are harder to digest and may make you feel nauseous or bloated. Drink plenty of fluids but you should avoid alcoholic beverages for 24 hours. If you had a esophageal dilation, please see attached instructions for diet.   ° °ACTIVITY: Your care partner should take you home directly after the procedure. You should plan to take it easy, moving slowly for the rest of the day. You can resume normal activity the day after the procedure however YOU SHOULD NOT DRIVE, use power tools, machinery or perform tasks that involve climbing or major physical exertion for 24 hours (because of the sedation medicines used during the test).  ° °SYMPTOMS TO REPORT IMMEDIATELY: °A gastroenterologist can be reached at any hour. Please call [phone]  for any of the following symptoms:  °Following lower endoscopy (colonoscopy, flexible sigmoidoscopy) °Excessive amounts of blood in the stool  °Significant tenderness, worsening of abdominal pains  °Swelling of the abdomen that is new, acute  °Fever of 100° or  higher  °Following upper endoscopy (EGD, EUS, ERCP, esophageal dilation) °Vomiting of blood or coffee ground material  °New, significant abdominal pain  °New, significant chest pain or pain under the shoulder blades  °Painful or persistently difficult swallowing  °New shortness of breath  °Black, tarry-looking or red, bloody stools ° °FOLLOW UP:  °If any biopsies were taken you will be contacted by phone or by letter within the next 1-3 weeks. Call [phone]  if you have not heard about the biopsies in 3 weeks.  °Please also call with any specific questions about appointments or follow up tests. ° °

## 2019-03-12 NOTE — Anesthesia Postprocedure Evaluation (Signed)
Anesthesia Post Note  Patient: Christine Barton  Procedure(s) Performed: ESOPHAGOGASTRODUODENOSCOPY (EGD) WITH PROPOFOL (N/A )     Patient location during evaluation: PACU Anesthesia Type: MAC Level of consciousness: awake and alert Pain management: pain level controlled Vital Signs Assessment: post-procedure vital signs reviewed and stable Respiratory status: spontaneous breathing, nonlabored ventilation and respiratory function stable Cardiovascular status: stable and blood pressure returned to baseline Anesthetic complications: no    Last Vitals:  Vitals:   03/12/19 1120 03/12/19 1130  BP: (!) 115/43 (!) 134/52  Pulse: 82 80  Resp: 17 18  Temp:    SpO2: 100% 100%    Last Pain:  Vitals:   03/12/19 1111  TempSrc: Temporal  PainSc: 0-No pain                 Audry Pili

## 2019-03-12 NOTE — Op Note (Signed)
[facility] Patient Name: Christine Barton Procedure Date: 03/12/2019 MRN: [id number] Attending MD: Mauri Pole , MD Date of Birth: [birth year] CSN: [id number] Age: 68 Admit Type: Outpatient Procedure:                Upper GI endoscopy Indications:              Recent gastrointestinal bleeding, Suspected upper                            gastrointestinal bleeding Providers:                Mauri Pole, MD, Glori Bickers, RN, Lina Sar, Technician, [facility], CRNA Referring MD:              Medicines:                Monitored Anesthesia Care Complications:            No immediate complications. Estimated Blood Loss:     Estimated blood loss: none. Procedure:                Pre-Anesthesia Assessment:                           - Prior to the procedure, a History and Physical                            was performed, and patient medications and                            allergies were reviewed. The patient's tolerance of                            previous anesthesia was also reviewed. The risks                            and benefits of the procedure and the sedation                            options and risks were discussed with the patient.                            All questions were answered, and informed consent                            was obtained. Prior Anticoagulants: The patient has                            taken no previous anticoagulant or antiplatelet                            agents. ASA Grade Assessment: III - A patient with  severe systemic disease. After reviewing the risks                            and benefits, the patient was deemed in                            satisfactory condition to undergo the procedure.                           After obtaining informed consent, the endoscope was                            passed under direct vision. Throughout the                 procedure, the patient's blood pressure, pulse, and                            oxygen saturations were monitored continuously. The                            [id number]-H190 ([id number]) Olympus gastroscope was                            introduced through the mouth, and advanced to the                            second part of duodenum. The upper GI endoscopy was                            accomplished without difficulty. The patient                            tolerated the procedure well. Scope In: Scope Out: Findings:      Grade I, small (< 1 mm) varices were found in the lower third of the       esophagus. They were less than 1 mm in largest diameter.      The Z-line was regular and was found 35 cm from the incisors.      Mild portal hypertensive gastropathy was found in the entire examined       stomach. No gastric varices      The examined duodenum was normal. Impression:               - Grade I and small (< 5 mm) esophageal varices.                           - Z-line regular, 35 cm from the incisors.                           - Portal hypertensive gastropathy.                           - Normal examined duodenum.                           -  No specimens collected. Moderate Sedation:      Not Applicable - Patient had care per Anesthesia. Recommendation:           - Patient has a contact number available for                            emergencies. The signs and symptoms of potential                            delayed complications were discussed with the                            patient. Return to normal activities tomorrow.                            Written discharge instructions were provided to the                            patient.                           - Resume previous diet.                           - Continue present medications.                           - Return to GI office in 3 months. Procedure Code(s):        --- Professional ---                            [id number], Esophagogastroduodenoscopy, flexible,                            transoral; diagnostic, including collection of                            specimen(s) by brushing or washing, when performed                            (separate procedure) Diagnosis Code(s):        --- Professional ---                           I85.00, Esophageal varices without bleeding                           K76.6, Portal hypertension                           K31.89, Other diseases of stomach and duodenum                           K92.2, Gastrointestinal hemorrhage, unspecified CPT copyright 2019 American Medical Association. All rights reserved. The codes documented in this report are preliminary and upon coder review may  be revised to meet current compliance requirements. Mauri Pole, MD 03/12/2019  11:08:31 AM This report has been signed electronically. Number of Addenda: 0

## 2019-03-12 NOTE — Interval H&P Note (Signed)
History and Physical Interval Note:  03/12/2019 9:49 AM  Christine Barton  has presented today for surgery, with the diagnosis of Cirrhosis, Melena.  The various methods of treatment have been discussed with the patient and family. After consideration of risks, benefits and other options for treatment, the patient has consented to  Procedure(s): ESOPHAGOGASTRODUODENOSCOPY (EGD) WITH PROPOFOL (N/A) as a surgical intervention.  The patient's history has been reviewed, patient examined, no change in status, stable for surgery.  I have reviewed the patient's chart and labs.  Questions were answered to the patient's satisfaction.     Kavitha Nandigam

## 2019-03-12 NOTE — Anesthesia Preprocedure Evaluation (Addendum)
Anesthesia Evaluation  Patient identified by MRN, date of birth, ID band Patient awake    Reviewed: Allergy & Precautions, NPO status , Patient's Chart, lab work & pertinent test results, reviewed documented beta blocker date and time   History of Anesthesia Complications Negative for: history of anesthetic complications  Airway Mallampati: II  TM Distance: >3 FB Neck ROM: Limited    Dental  (+) Edentulous Upper, Edentulous Lower   Pulmonary COPD,  COPD inhaler, Current SmokerPatient did not abstain from smoking.,   Hx right spontaneous PTX    Pulmonary exam normal        Cardiovascular + CAD  Normal cardiovascular exam   '20 TTE - EF 60 to 65%. Mild MR and TR. Mildly elevated pulmonary artery systolic pressure.    Neuro/Psych negative neurological ROS  negative psych ROS   GI/Hepatic negative GI ROS, (+)     substance abuse  alcohol use,   Endo/Other  diabetes, Type 2, Oral Hypoglycemic Agents  Renal/GU negative Renal ROS     Musculoskeletal  (+) Arthritis ,   Abdominal   Peds  Hematology negative hematology ROS (+)   Anesthesia Other Findings Covid negative 11/27  Reproductive/Obstetrics                            Anesthesia Physical Anesthesia Plan  ASA: III  Anesthesia Plan: MAC   Post-op Pain Management:    Induction: Intravenous  PONV Risk Score and Plan: 2 and Propofol infusion and Treatment may vary due to age or medical condition  Airway Management Planned: Nasal Cannula and Natural Airway  Additional Equipment: None  Intra-op Plan:   Post-operative Plan:   Informed Consent: I have reviewed the patients History and Physical, chart, labs and discussed the procedure including the risks, benefits and alternatives for the proposed anesthesia with the patient or authorized representative who has indicated his/her understanding and acceptance.       Plan  Discussed with: CRNA and Anesthesiologist  Anesthesia Plan Comments:        Anesthesia Quick Evaluation

## 2019-03-12 NOTE — Transfer of Care (Signed)
Immediate Anesthesia Transfer of Care Note  Patient: Christine Barton  Procedure(s) Performed: ESOPHAGOGASTRODUODENOSCOPY (EGD) WITH PROPOFOL (N/A )  Patient Location: PACU  Anesthesia Type:MAC  Level of Consciousness: awake, alert  and oriented  Airway & Oxygen Therapy: Patient Spontanous Breathing and Patient connected to nasal cannula oxygen  Post-op Assessment: Report given to RN and Post -op Vital signs reviewed and stable  Post vital signs: Reviewed and stable  Last Vitals:  Vitals Value Taken Time  BP    Temp    Pulse    Resp 17 03/12/19 1111  SpO2    Vitals shown include unvalidated device data.  Last Pain:  Vitals:   03/12/19 0941  TempSrc: Oral  PainSc: 0-No pain         Complications: No apparent anesthesia complications

## 2019-03-13 ENCOUNTER — Ambulatory Visit: Payer: Medicare HMO

## 2019-03-13 ENCOUNTER — Telehealth: Payer: Self-pay

## 2019-03-13 ENCOUNTER — Encounter (HOSPITAL_COMMUNITY): Payer: Self-pay | Admitting: Gastroenterology

## 2019-03-13 DIAGNOSIS — R6889 Other general symptoms and signs: Secondary | ICD-10-CM | POA: Diagnosis not present

## 2019-03-13 DIAGNOSIS — N898 Other specified noninflammatory disorders of vagina: Secondary | ICD-10-CM

## 2019-03-13 DIAGNOSIS — M5136 Other intervertebral disc degeneration, lumbar region: Secondary | ICD-10-CM | POA: Diagnosis not present

## 2019-03-13 NOTE — Telephone Encounter (Signed)
Pt notified referral done and our [facility] will call and schedule appt

## 2019-03-13 NOTE — Telephone Encounter (Signed)
Thanks for letting me know  I placed a gyn referral  The office will call her to schedule that

## 2019-03-13 NOTE — Telephone Encounter (Signed)
Pt requesting cb from [place] about 03/06/19 visit; I asked pt if I could help or get a more detailed note;pt said that [facility] is aware of the situation and pt request cb.

## 2019-03-13 NOTE — Telephone Encounter (Signed)
Called pt and she wanted to discuss the pelvic pain/burning that she has been having. Pt said that Dr. Glori Bickers prescribed her 2 meds to take for 7 days. Pt took her last pill yesterday and she is still having the same burning pain in the same area Dr. Glori Bickers is aware of. Pt said that the medication did help improve the pain but it's not completely resolved. Pt said the pain is back to when it 1st started before it got worse. Pt didn't want to wait around incase the pain starts to worsen again. Pt said she didn't know if she needs to give it a few more days, or if Dr. Glori Bickers wanted to refer her to GYN or if she wanted to extend med and send another round of meds in ([facility] [address])

## 2019-03-14 DIAGNOSIS — M545 Low back pain: Secondary | ICD-10-CM | POA: Diagnosis not present

## 2019-03-14 NOTE — Telephone Encounter (Signed)
Appt scheduled and patient advised.

## 2019-03-15 ENCOUNTER — Ambulatory Visit (INDEPENDENT_AMBULATORY_CARE_PROVIDER_SITE_OTHER): Payer: Medicare HMO | Admitting: Otolaryngology

## 2019-03-20 DIAGNOSIS — B009 Herpesviral infection, unspecified: Secondary | ICD-10-CM | POA: Diagnosis not present

## 2019-03-20 DIAGNOSIS — Z124 Encounter for screening for malignant neoplasm of cervix: Secondary | ICD-10-CM | POA: Diagnosis not present

## 2019-03-20 DIAGNOSIS — Z681 Body mass index (BMI) 19 or less, adult: Secondary | ICD-10-CM | POA: Diagnosis not present

## 2019-03-23 NOTE — Telephone Encounter (Signed)
Spoke with the patient. [place] PAP form faxed today.  Copy on my desk.

## 2019-03-23 NOTE — Telephone Encounter (Signed)
Pt requested an update on Xifaxan.

## 2019-03-27 DIAGNOSIS — R102 Pelvic and perineal pain: Secondary | ICD-10-CM | POA: Diagnosis not present

## 2019-03-29 ENCOUNTER — Telehealth: Payer: Self-pay | Admitting: Gastroenterology

## 2019-03-29 ENCOUNTER — Other Ambulatory Visit: Payer: Self-pay

## 2019-03-29 ENCOUNTER — Ambulatory Visit (INDEPENDENT_AMBULATORY_CARE_PROVIDER_SITE_OTHER): Payer: Medicare HMO

## 2019-03-29 DIAGNOSIS — Z23 Encounter for immunization: Secondary | ICD-10-CM

## 2019-03-29 NOTE — Telephone Encounter (Signed)
ok 

## 2019-03-29 NOTE — Telephone Encounter (Signed)
Dr [name] please note patient was denied for patient assistance , she has an appointment tomorrow

## 2019-03-30 ENCOUNTER — Ambulatory Visit: Payer: Medicare HMO | Admitting: Gastroenterology

## 2019-03-30 ENCOUNTER — Encounter: Payer: Self-pay | Admitting: Gastroenterology

## 2019-03-30 VITALS — BP 98/50 | HR 72 | Temp 97.0°F | Ht 64.0 in | Wt 99.4 lb

## 2019-03-30 DIAGNOSIS — K7290 Hepatic failure, unspecified without coma: Secondary | ICD-10-CM | POA: Diagnosis not present

## 2019-03-30 DIAGNOSIS — K3189 Other diseases of stomach and duodenum: Secondary | ICD-10-CM

## 2019-03-30 DIAGNOSIS — K7460 Unspecified cirrhosis of liver: Secondary | ICD-10-CM

## 2019-03-30 DIAGNOSIS — K7031 Alcoholic cirrhosis of liver with ascites: Secondary | ICD-10-CM | POA: Diagnosis not present

## 2019-03-30 DIAGNOSIS — I8510 Secondary esophageal varices without bleeding: Secondary | ICD-10-CM

## 2019-03-30 DIAGNOSIS — K7581 Nonalcoholic steatohepatitis (NASH): Secondary | ICD-10-CM

## 2019-03-30 DIAGNOSIS — K219 Gastro-esophageal reflux disease without esophagitis: Secondary | ICD-10-CM | POA: Diagnosis not present

## 2019-03-30 DIAGNOSIS — K766 Portal hypertension: Secondary | ICD-10-CM | POA: Diagnosis not present

## 2019-03-30 DIAGNOSIS — K7682 Hepatic encephalopathy: Secondary | ICD-10-CM

## 2019-03-30 DIAGNOSIS — R188 Other ascites: Secondary | ICD-10-CM

## 2019-03-30 MED ORDER — FUROSEMIDE 20 MG PO TABS: ORAL_TABLET | ORAL | 3 refills | Status: DC

## 2019-03-30 NOTE — Progress Notes (Signed)
Christine Barton    [id number]    04-Jul-1950  Primary Care Physician:Tower, [name], MD  Referring Physician: Tower, [name], MD [place],  [place] [id number]   Chief complaint: Cirrhosis  HPI: 68 year old female with decompensated EtOH cirrhosis here for follow-up visit Pelvic ultrasound by GYN showed evidence of pelvic intra-abdominal fluid.  She started taking furosemide 2 tablets daily since last Tuesday and she feels her abdomen is less distended and no longer has abdominal pain. Denies any nausea, vomiting, abdominal pain, melena or bright red blood per rectum Insurance denied Xifaxan and she was also unable to get the patient assistance.  She is not tolerating lactulose very well, she feels shaky if she takes more than a teaspoon at a time.  She does feel irritable but denies excessive sleeping or confusion.  No swelling of feet.  She is losing weight despite drinking boost and eating well.  Complains of extreme fatigue.  Reviewed labs from March 27, 2019: BUN 10, creatinine 0.75, sodium 138, potassium 4.8, bilirubin 0.7, alkaline phosphatase 106, AST 23, ALT 10.  Hemoglobin 11.1, hematocrit 32.1, WBC 9.7, platelets aggregated unable to count.  Outpatient Encounter Medications as of 03/30/2019  Medication Sig  . acetaminophen (TYLENOL) 500 MG tablet Take 500 mg by mouth 2 (two) times a day. Uses only as needed  . albuterol (VENTOLIN HFA) 108 (90 Base) MCG/ACT inhaler Inhale 2 puffs into the lungs every 4 (four) hours as needed for wheezing or shortness of breath.  . doxylamine, Sleep, (UNISOM) 25 MG tablet Take 25 mg by mouth at bedtime as needed for sleep.   Marland Kitchen lactulose (CHRONULAC) 10 GM/15ML solution Take 15 mLs (10 g total) by mouth 3 (three) times daily. (Patient taking differently: Take 20 g by mouth See admin instructions. 2 tablespoons daily)  . metFORMIN (GLUCOPHAGE XR) 500 MG 24 hr tablet Take 1 tablet (500 mg total) by mouth daily with  breakfast.  . methocarbamol (ROBAXIN) 500 MG tablet Take 1 tablet (500 mg total) by mouth every 8 (eight) hours as needed for muscle spasms. Caution of sedation  . Multiple Vitamin (MULTIVITAMIN) tablet Take 1 tablet by mouth daily.  . nadolol (CORGARD) 20 MG tablet Take 1 tablet (20 mg total) by mouth daily.  . pantoprazole (PROTONIX) 40 MG tablet Take 1 tablet (40 mg total) by mouth 2 (two) times daily before a meal.  . Polyethyl Glycol-Propyl Glycol (SYSTANE OP) Place 1 drop into the left eye daily as needed (dryness).  . rosuvastatin (CRESTOR) 5 MG tablet Take 1 tablet (5 mg total) by mouth daily at 6 PM.  . traMADol (ULTRAM) 50 MG tablet Take 50 mg by mouth daily as needed. At bedtime as needed  . XIFAXAN 550 MG TABS tablet Take 1 tablet (550 mg total) by mouth 2 (two) times daily. (Patient not taking: Reported on 03/06/2019)  . [DISCONTINUED] valACYclovir (VALTREX) 500 MG tablet Take 1 tablet (500 mg total) by mouth 2 (two) times daily.   No facility-administered encounter medications on file as of 03/30/2019.    Allergies as of 03/30/2019 - Review Complete 03/30/2019  Allergen Reaction Noted  . Oxycodone Nausea And Vomiting 11/02/2014  . Glipizide Other (See Comments) 09/12/2013    Past Medical History:  Diagnosis Date  . Alcohol abuse, unspecified   . Breast cancer ([place]) 1998   Right  . Cataract    left eye  . Cervical spondylosis 2006  MRI  . Degeneration of cervical intervertebral disc 2006   MRI  . Diabetes mellitus without complication ([place])   . Diverticulosis of colon (without mention of hemorrhage)   . Hyperpotassemia   . Microscopic hematuria   . Mononeuritis of unspecified site   . Nonspecific abnormal results of liver function study   . Other abnormal glucose   . Other and unspecified hyperlipidemia    no per pt  . Other chronic nonalcoholic liver disease   . Personal history of chemotherapy   . Personal history of malignant neoplasm of breast   . Personal  history of radiation therapy   . Pneumothorax, acute    right, spontaneous  . Tobacco use disorder   . Unspecified vitamin D deficiency     Past Surgical History:  Procedure Laterality Date  . BREAST BIOPSY  9/03   Right  . BREAST LUMPECTOMY Right 1998  . BREAST LUMPECTOMY  1998  . CHEST TUBE INSERTION  11/02/2014  . COLONOSCOPY    . ESOPHAGOGASTRODUODENOSCOPY (EGD) WITH PROPOFOL N/A 10/30/2018   Procedure: ESOPHAGOGASTRODUODENOSCOPY (EGD) WITH PROPOFOL;  Surgeon: Mauri Pole, MD;  Location: [facility];  Service: Endoscopy;  Laterality: N/A;  . ESOPHAGOGASTRODUODENOSCOPY (EGD) WITH PROPOFOL N/A 03/12/2019   Procedure: ESOPHAGOGASTRODUODENOSCOPY (EGD) WITH PROPOFOL;  Surgeon: Mauri Pole, MD;  Location: [facility];  Service: Endoscopy;  Laterality: N/A;  . EYE SURGERY  02/2017   cataract extraction with lens implant-left  . TUBAL LIGATION      Family History  Problem Relation Age of Onset  . Heart failure Father   . Heart attack Father   . Colon cancer Maternal Uncle   . Stroke Mother   . Esophageal cancer Neg Hx   . Rectal cancer Neg Hx   . Stomach cancer Neg Hx     Social History   Socioeconomic History  . Marital status: Single    Spouse name: Not on file  . Number of children: 1  . Years of education: Not on file  . Highest education level: Not on file  Occupational History  . Occupation: retired    Fish farm manager: [employer]  Tobacco Use  . Smoking status: Current Every Day Smoker    Packs/day: 1.00    Years: 53.00    Pack years: 53.00    Types: Cigarettes  . Smokeless tobacco: Never Used  . Tobacco comment: tobacco info given 04/27/2018  Substance and Sexual Activity  . Alcohol use: Not Currently    Comment: occasional use  . Drug use: No  . Sexual activity: Not Currently  Other Topics Concern  . Not on file  Social History Narrative   Divorced      1 child      Works at [employer] Strain: [place]   . Difficulty of Paying Living Expenses: Not hard at all  Food Insecurity: No Food Insecurity  . Worried About Charity fundraiser in the Last Year: Never true  . Ran Out of Food in the Last Year: Never true  Transportation Needs: No Transportation Needs  . Lack of Transportation (Medical): No  . Lack of Transportation (Non-Medical): No  Physical Activity: Inactive  . Days of Exercise per Week: 0 days  . Minutes of Exercise per Session: 0 min  Stress: No Stress Concern Present  . Feeling of Stress : Only a little  Social Connections:   .  Frequency of Communication with Friends and Family: Not on file  . Frequency of Social Gatherings with Friends and Family: Not on file  . Attends Religious Services: Not on file  . Active Member of Clubs or Organizations: Not on file  . Attends Archivist Meetings: Not on file  . Marital Status: Not on file  Intimate Partner Violence: Not At Risk  . Fear of Current or Ex-Partner: No  . Emotionally Abused: No  . Physically Abused: No  . Sexually Abused: No      Review of systems: Review of Systems  Constitutional: Negative for fever and chills.  HENT: Positive for runny nose Eyes: Negative for blurred vision.  Respiratory: Negative for cough, shortness of breath and wheezing.   Cardiovascular: Negative for chest pain and palpitations.  Gastrointestinal: as per HPI Genitourinary: Negative for dysuria, urgency, frequency and hematuria.  Musculoskeletal: Positive for myalgias, back pain and joint pain.  Skin: Positive for dry skin, itching and rash.  Neurological: Negative for dizziness, tremors, focal weakness, seizures and loss of consciousness.  Endo/Heme/Allergies: Positive for easy bruising Psychiatric/Behavioral: Negative for depression, suicidal ideas and hallucinations.  Positive for anxiety All other systems reviewed and are negative.   Physical Exam: Vitals:   03/30/19 1044  BP:  (!) 98/50  Pulse: 72  Temp: (!) 97 F (36.1 C)  SpO2: 100%   Body mass index is 17.06 kg/m. Gen:      No acute distress HEENT:  EOMI, sclera anicteric Neck:     No masses; no thyromegaly Lungs:    Clear to auscultation bilaterally; normal respiratory effort CV:         Regular rate and rhythm; no murmurs Abd:      + bowel sounds; soft, non-tender; no palpable masses, no distension Ext:    No edema; adequate peripheral perfusion Skin:      Warm and dry; no rash Neuro: alert and oriented x 3 Psych: normal mood and affect  Data Reviewed:  Reviewed labs, radiology imaging, old records and pertinent past GI work up   Assessment and Plan/Recommendations:  68 year old female with history of breast cancer, diabetes, EtOH and NASH decompensated cirrhosis with ascites and hepatic encephalopathy  Ascites: Advised patient to take furosemide 20 mg every other day. She started taking furosemide 40 mg daily last Tuesday after she was informed that she has She was previously on spironolactone and furosemide, discontinued due to renal dysfunction. Will continue to monitor BMP, check next Tuesday  Hepatic encephalopathy: Continue lactulose as tolerated Insurance denied Xifaxan, will try to obtain patient assistance  History of esophageal varices and portal gastropathy: Continue nadolol  GERD: Continue Protonix and antireflux measures  HCC screening: Up-to-date with normal AFP and imaging with no liver lesions concerning for [place]  Weight loss and fatigue: Continue high-protein high-calorie diet, avoid simple sugars and soda  Return in 4 to 6 weeks or sooner if needed  25 minutes was spent face-to-face with the patient. Greater than 50% of the time used for counseling as well as treatment plan and follow-up. She had multiple questions which were answered to her satisfaction  K. Denzil Magnuson , MD    CC: Tower, [name], MD

## 2019-03-30 NOTE — Patient Instructions (Addendum)
You will need to come into the Lab next Tuesday to have your labs drawn  We have sent Furosemide 20 mg to your pharmacy  Continue lactulose  We have given you samples of Xifaxian to take once daily along with your Lactulose   I appreciate the  opportunity to care for you  Thank You   Harl Bowie , MD

## 2019-04-02 ENCOUNTER — Telehealth: Payer: Self-pay | Admitting: Gastroenterology

## 2019-04-02 ENCOUNTER — Other Ambulatory Visit: Payer: Self-pay

## 2019-04-02 DIAGNOSIS — K7031 Alcoholic cirrhosis of liver with ascites: Secondary | ICD-10-CM

## 2019-04-02 DIAGNOSIS — K30 Functional dyspepsia: Secondary | ICD-10-CM

## 2019-04-02 NOTE — Telephone Encounter (Signed)
Doctor of the day Patient of Dr [name].   Burning inside of her in the low abdomen. States it is not vaginal, nor is this burning "between my legs." She describes a burning pain in her low abdomen, feels the burning inside and rubs coconut oil on her abdomen to soothe the pain. Her bowel movements are "always messed up cause of that lactulose."  It comes in waves when it gets bad. Wakes her up. The discomfort never completely goes away. She states she saw GYN and was told that part was fine. Any suggestions? Thanks

## 2019-04-02 NOTE — Telephone Encounter (Signed)
Scheduled as ordered at [place]. Patient will go 04/05/19 at 9:45 am for a 10:00 am appointment.  Questions invited and answered. Agrees to this plan.

## 2019-04-02 NOTE — Telephone Encounter (Signed)
Pt requested a call back to discuss water retention.

## 2019-04-02 NOTE — Telephone Encounter (Signed)
She could have infected ascites which is called spontaneous bacterial peritonitis   In order to tell needs:  US-guided paracentesis Remove up to 1 L  Labs - cell count and diff, albumin, cytology, culture

## 2019-04-04 ENCOUNTER — Other Ambulatory Visit (INDEPENDENT_AMBULATORY_CARE_PROVIDER_SITE_OTHER): Payer: Medicare HMO

## 2019-04-04 ENCOUNTER — Telehealth: Payer: Self-pay | Admitting: Gastroenterology

## 2019-04-04 DIAGNOSIS — K7290 Hepatic failure, unspecified without coma: Secondary | ICD-10-CM

## 2019-04-04 DIAGNOSIS — K7031 Alcoholic cirrhosis of liver with ascites: Secondary | ICD-10-CM

## 2019-04-04 DIAGNOSIS — K7682 Hepatic encephalopathy: Secondary | ICD-10-CM

## 2019-04-04 LAB — COMPREHENSIVE METABOLIC PANEL
ALT: 9 U/L (ref 0–35)
AST: 21 U/L (ref 0–37)
Albumin: 3.9 g/dL (ref 3.5–5.2)
Alkaline Phosphatase: 89 U/L (ref 39–117)
BUN: 12 mg/dL (ref 6–23)
CO2: 25 mEq/L (ref 19–32)
Calcium: 9.6 mg/dL (ref 8.4–10.5)
Chloride: 98 mEq/L (ref 96–112)
Creatinine, Ser: 0.73 mg/dL (ref 0.40–1.20)
GFR: 79.21 mL/min (ref >60.00)
Glucose, Bld: 155 mg/dL — ABNORMAL HIGH (ref 70–99)
Potassium: 3.9 mEq/L (ref 3.5–5.1)
Sodium: 132 mEq/L — ABNORMAL LOW (ref 135–145)
Total Bilirubin: 0.8 mg/dL (ref 0.2–1.2)
Total Protein: 6.6 g/dL (ref 6.0–8.3)

## 2019-04-04 NOTE — Telephone Encounter (Signed)
Pt stated that she is feeling better and has cancelled her US paracentesis appointment.

## 2019-04-05 ENCOUNTER — Ambulatory Visit (HOSPITAL_COMMUNITY): Payer: Medicare HMO

## 2019-04-26 ENCOUNTER — Telehealth: Payer: Self-pay | Admitting: Family Medicine

## 2019-04-26 NOTE — Telephone Encounter (Signed)
Isolate herself  She can get tested 5 days after exposure (earlier may not tell her accurately) or at any time if she develops symptoms  Keep [place] posed

## 2019-04-26 NOTE — Telephone Encounter (Signed)
Pt called stating she was exposed to covid last night.  Her grandson was over to visit no one had a mask on. They were not social distance.  Later last night he was running a fever and went to be tested today with positive results.  What do you recommend she do?  Testing?? She is aware to quarantine until she hears back from you

## 2019-04-26 NOTE — Telephone Encounter (Signed)
Pt notifeid of Dr. Marliss Coots recommendations and verbalized understanding, testing phone # given to pt

## 2019-04-29 ENCOUNTER — Other Ambulatory Visit: Payer: Self-pay | Admitting: Gastroenterology

## 2019-05-01 ENCOUNTER — Ambulatory Visit: Payer: Medicare HMO | Attending: Internal Medicine

## 2019-05-01 DIAGNOSIS — Z20822 Contact with and (suspected) exposure to covid-19: Secondary | ICD-10-CM

## 2019-05-02 LAB — NOVEL CORONAVIRUS, NAA: SARS-CoV-2, NAA: NOT DETECTED

## 2019-05-04 ENCOUNTER — Ambulatory Visit (INDEPENDENT_AMBULATORY_CARE_PROVIDER_SITE_OTHER): Payer: Medicare HMO | Admitting: Gastroenterology

## 2019-05-04 ENCOUNTER — Encounter: Payer: Self-pay | Admitting: Gastroenterology

## 2019-05-04 DIAGNOSIS — K7031 Alcoholic cirrhosis of liver with ascites: Secondary | ICD-10-CM

## 2019-05-04 MED ORDER — FUROSEMIDE 20 MG PO TABS: ORAL_TABLET | ORAL | 3 refills | Status: DC

## 2019-05-04 NOTE — Progress Notes (Signed)
Patient had exposure to Covid -19 on 04/26/19, she did test on Day 5 and was negative She remains asymptomatic.  Given h/o exposure and is still within 10 day window, will reschedule appointment to next week Wednesday 05/09/19  C/o worsening abdominal distension Advised her to start taking Lasix 16m daily instead of every other day  Check BMP next week 05/08/19  K. VDenzil Magnuson, MD [phone]

## 2019-05-08 ENCOUNTER — Other Ambulatory Visit (INDEPENDENT_AMBULATORY_CARE_PROVIDER_SITE_OTHER): Payer: Medicare HMO

## 2019-05-08 DIAGNOSIS — K7031 Alcoholic cirrhosis of liver with ascites: Secondary | ICD-10-CM | POA: Diagnosis not present

## 2019-05-08 LAB — BASIC METABOLIC PANEL
BUN: 13 mg/dL (ref 6–23)
CO2: 27 mEq/L (ref 19–32)
Calcium: 9.6 mg/dL (ref 8.4–10.5)
Chloride: 101 mEq/L (ref 96–112)
Creatinine, Ser: 0.69 mg/dL (ref 0.40–1.20)
GFR: 84.51 mL/min (ref >60.00)
Glucose, Bld: 189 mg/dL — ABNORMAL HIGH (ref 70–99)
Potassium: 4 mEq/L (ref 3.5–5.1)
Sodium: 134 mEq/L — ABNORMAL LOW (ref 135–145)

## 2019-05-09 ENCOUNTER — Other Ambulatory Visit: Payer: Self-pay

## 2019-05-09 ENCOUNTER — Ambulatory Visit: Payer: Medicare HMO | Admitting: Gastroenterology

## 2019-05-09 VITALS — BP 112/64 | HR 85 | Temp 98.4°F | Ht 65.0 in | Wt 104.0 lb

## 2019-05-09 DIAGNOSIS — K7290 Hepatic failure, unspecified without coma: Secondary | ICD-10-CM | POA: Diagnosis not present

## 2019-05-09 DIAGNOSIS — K7682 Hepatic encephalopathy: Secondary | ICD-10-CM

## 2019-05-09 DIAGNOSIS — K7031 Alcoholic cirrhosis of liver with ascites: Secondary | ICD-10-CM

## 2019-05-09 DIAGNOSIS — T501X5S Adverse effect of loop [high-ceiling] diuretics, sequela: Secondary | ICD-10-CM

## 2019-05-09 MED ORDER — SPIRONOLACTONE 25 MG PO TABS: 25.0000 mg | ORAL_TABLET | Freq: Every day | ORAL | 1 refills | Status: DC

## 2019-05-09 NOTE — Patient Instructions (Addendum)
Have your labs drawn on 05/14/19 in our basement lab.   Continue Lasix 20 mg daily.   We have sent the following medications to your pharmacy for you to pick up at your convenience: Aldactone 25 mg every other day.  Take the Xifaxan samples as needed daily.   Normal BMI (Body Mass Index- based on height and weight) is between 23 and 30. Your BMI today is Body mass index is 17.31 kg/m. Marland Kitchen Please consider follow up  regarding your BMI with your Primary Care Provider.  Follow up with Dr. [name] in 3 months. We do not have a schedule at this time. Please call back in a few weeks to schedule follow up visit.

## 2019-05-09 NOTE — Progress Notes (Signed)
Christine Barton    [id number]    16-Aug-1950  Primary Care Physician:Tower, [name], MD  Referring Physician: Tower, [name], MD [place],  [place] [id number]   Chief complaint:  Ascites  HPI:  69 year old with decompensated alcoholic and [name] cirrhosis here for follow-up visit for ascites and cirrhosis  Abdominal distention and swelling is slightly decreased since she is taking furosemide 20 mg daily instead of every other day.  Denies any swelling in her legs She is taking lactulose with 2-3 soft bowel movements daily.  Insurance denied Xifaxan.  She is taking 1 capsule daily from the samples that were given during prior office visit.  Denies any nausea, vomiting, abdominal pain, melena or bright red blood per rectum  BMP Latest Ref Rng & Units 05/08/2019 04/04/2019 02/26/2019  Glucose 70 - 99 mg/dL 189(H) 155(H) 164(H)  BUN 6 - 23 mg/dL 13 12 11   Creatinine 0.40 - 1.20 mg/dL 0.69 0.73 0.71  Sodium 135 - 145 mEq/L 134(L) 132(L) 134(L)  Potassium 3.5 - 5.1 mEq/L 4.0 3.9 4.6  Chloride 96 - 112 mEq/L 101 98 102  CO2 19 - 32 mEq/L 27 25 24   Calcium 8.4 - 10.5 mg/dL 9.6 9.6 9.9     Outpatient Encounter Medications as of 05/09/2019  Medication Sig  . acetaminophen (TYLENOL) 500 MG tablet Take 500 mg by mouth 2 (two) times a day. Uses only as needed  . albuterol (VENTOLIN HFA) 108 (90 Base) MCG/ACT inhaler Inhale 2 puffs into the lungs every 4 (four) hours as needed for wheezing or shortness of breath.  . doxylamine, Sleep, (UNISOM) 25 MG tablet Take 25 mg by mouth at bedtime as needed for sleep.   . furosemide (LASIX) 20 MG tablet Take daily  . lactulose (CHRONULAC) 10 GM/15ML solution Take 15 mLs (10 g total) by mouth 3 (three) times daily. (Patient taking differently: Take 20 g by mouth See admin instructions. 2 tablespoons daily)  . metFORMIN (GLUCOPHAGE XR) 500 MG 24 hr tablet Take 1 tablet (500 mg total) by mouth daily with breakfast.  .  methocarbamol (ROBAXIN) 500 MG tablet Take 1 tablet (500 mg total) by mouth every 8 (eight) hours as needed for muscle spasms. Caution of sedation  . Multiple Vitamin (MULTIVITAMIN) tablet Take 1 tablet by mouth daily.  . nadolol (CORGARD) 20 MG tablet Take 1 tablet (20 mg total) by mouth daily.  . pantoprazole (PROTONIX) 40 MG tablet TAKE 1 TABLET (40 MG TOTAL) BY MOUTH 2 (TWO) TIMES DAILY BEFORE A MEAL.  [name] Glycol-Propyl Glycol (SYSTANE OP) Place 1 drop into the left eye daily as needed (dryness).  . rosuvastatin (CRESTOR) 5 MG tablet Take 1 tablet (5 mg total) by mouth daily at 6 PM.  . traMADol (ULTRAM) 50 MG tablet Take 50 mg by mouth daily as needed. At bedtime as needed  . XIFAXAN 550 MG TABS tablet Take 1 tablet (550 mg total) by mouth 2 (two) times daily.   No facility-administered encounter medications on file as of 05/09/2019.    Allergies as of 05/09/2019 - Review Complete 05/09/2019  Allergen Reaction Noted  . Oxycodone Nausea And Vomiting 11/02/2014  . Glipizide Other (See Comments) 09/12/2013    Past Medical History:  Diagnosis Date  . Alcohol abuse, unspecified   . Breast cancer ([place]) 1998   Right  . Cataract    left eye  . Cervical spondylosis 2006   MRI  .  Degeneration of cervical intervertebral disc 2006   MRI  . Diabetes mellitus without complication ([place])   . Diverticulosis of colon (without mention of hemorrhage)   . Hyperpotassemia   . Microscopic hematuria   . Mononeuritis of unspecified site   . Nonspecific abnormal results of liver function study   . Other abnormal glucose   . Other and unspecified hyperlipidemia    no per pt  . Other chronic nonalcoholic liver disease   . Personal history of chemotherapy   . Personal history of malignant neoplasm of breast   . Personal history of radiation therapy   . Pneumothorax, acute    right, spontaneous  . Tobacco use disorder   . Unspecified vitamin D deficiency     Past Surgical History:   Procedure Laterality Date  . BREAST BIOPSY  9/03   Right  . BREAST LUMPECTOMY Right 1998  . BREAST LUMPECTOMY  1998  . CHEST TUBE INSERTION  11/02/2014  . COLONOSCOPY    . ESOPHAGOGASTRODUODENOSCOPY (EGD) WITH PROPOFOL N/A 10/30/2018   Procedure: ESOPHAGOGASTRODUODENOSCOPY (EGD) WITH PROPOFOL;  Surgeon: Mauri Pole, MD;  Location: [facility];  Service: Endoscopy;  Laterality: N/A;  . ESOPHAGOGASTRODUODENOSCOPY (EGD) WITH PROPOFOL N/A 03/12/2019   Procedure: ESOPHAGOGASTRODUODENOSCOPY (EGD) WITH PROPOFOL;  Surgeon: Mauri Pole, MD;  Location: [facility];  Service: Endoscopy;  Laterality: N/A;  . EYE SURGERY  02/2017   cataract extraction with lens implant-left  . TUBAL LIGATION      Family History  Problem Relation Age of Onset  . Heart failure Father   . Heart attack Father   . Colon cancer Maternal Uncle   . Stroke Mother   . Esophageal cancer Neg Hx   . Rectal cancer Neg Hx   . Stomach cancer Neg Hx     Social History   Socioeconomic History  . Marital status: Single    Spouse name: Not on file  . Number of children: 1  . Years of education: Not on file  . Highest education level: Not on file  Occupational History  . Occupation: retired    Fish farm manager: [employer]  Tobacco Use  . Smoking status: Current Every Day Smoker    Packs/day: 1.00    Years: 53.00    Pack years: 53.00    Types: Cigarettes  . Smokeless tobacco: Never Used  . Tobacco comment: tobacco info given 04/27/2018  Substance and Sexual Activity  . Alcohol use: Not Currently    Comment: occasional use  . Drug use: No  . Sexual activity: Not Currently  Other Topics Concern  . Not on file  Social History Narrative   Divorced      1 child      Works at [employer] Strain: [place]   . Difficulty of Paying Living Expenses: Not hard at all  Food Insecurity: No Food Insecurity  . Worried About Sales executive in the Last Year: Never true  . Ran Out of Food in the Last Year: Never true  Transportation Needs: No Transportation Needs  . Lack of Transportation (Medical): No  . Lack of Transportation (Non-Medical): No  Physical Activity: Inactive  . Days of Exercise per Week: 0 days  . Minutes of Exercise per Session: 0 min  Stress: No Stress Concern Present  . Feeling of Stress : Only a little  Social Connections:   . Frequency of Communication  with Friends and Family: Not on file  . Frequency of Social Gatherings with Friends and Family: Not on file  . Attends Religious Services: Not on file  . Active Member of Clubs or Organizations: Not on file  . Attends Archivist Meetings: Not on file  . Marital Status: Not on file  Intimate Partner Violence: Not At Risk  . Fear of Current or Ex-Partner: No  . Emotionally Abused: No  . Physically Abused: No  . Sexually Abused: No      Review of systems: Review of Systems  Constitutional: Negative for fever and chills.  HENT: Negative.   Eyes: Negative for blurred vision.  Respiratory: Negative for cough, shortness of breath and wheezing.   Cardiovascular: Negative for chest pain and palpitations.  Gastrointestinal: as per HPI Genitourinary: Negative for dysuria, urgency, frequency and hematuria.  Musculoskeletal: Negative for myalgias, back pain and joint pain.  Skin: Negative for itching and rash.  Neurological: Negative for dizziness, tremors, focal weakness, seizures and loss of consciousness.  Endo/Heme/Allergies: Negative Psychiatric/Behavioral: Negative for depression, suicidal ideas and hallucinations.  All other systems reviewed and are negative.   Physical Exam: Vitals:   05/09/19 1102  BP: 112/64  Pulse: 85  Temp: 98.4 F (36.9 C)   Body mass index is 17.31 kg/m. Gen:      No acute distress HEENT:  EOMI, sclera anicteric Neck:     No masses; no thyromegaly Lungs:    Clear to auscultation bilaterally;  normal respiratory effort CV:         Regular rate and rhythm; no murmurs Abd:      + bowel sounds; soft, non-tender; no palpable masses, no distension Ext:    No edema; adequate peripheral perfusion Skin:      Warm and dry; no rash Neuro: alert and oriented x 3 Psych: normal mood and affect  Data Reviewed:  Reviewed labs, radiology imaging, old records and pertinent past GI work up   Assessment and Plan/Recommendations:  69 year old female with history of breast cancer, diabetes, alcohol and NASH induced decompensated cirrhosis with ascites and hepatic encephalopathy  Ascites: Continue furosemide 20 mg daily Add Aldactone 25 mg every other day Recheck BMP next week, February 1 to monitor electrolytes and renal function with loop diuretics  Hepatic encephalopathy: Continue lactulose with goal 2-3 soft bowel movements daily  HCC screening: MRI liver with and without contrast December 26, 2018 negative for hepatic lesions.  AFP within normal limits We will schedule abdominal ultrasound in 3 months, due for recall March 2021  Continue high-protein  diet, avoid processed carbohydrates and simple sugars Continue better glycemic control for diabetes  Return in 3 months or sooner if needed This visit required 30 minutes of patient care (this includes precharting, chart review, review of results, face-to-face time used for counseling as well as treatment plan and follow-up. The patient was provided an opportunity to ask questions and all were answered. The patient agreed with the plan and demonstrated an understanding of the instructions.  Damaris Hippo , MD    CC: Tower, [name], MD

## 2019-05-13 ENCOUNTER — Encounter (INDEPENDENT_AMBULATORY_CARE_PROVIDER_SITE_OTHER): Payer: Self-pay

## 2019-05-14 ENCOUNTER — Other Ambulatory Visit: Payer: Medicare HMO

## 2019-05-14 ENCOUNTER — Other Ambulatory Visit: Payer: Self-pay | Admitting: *Deleted

## 2019-05-14 DIAGNOSIS — K7031 Alcoholic cirrhosis of liver with ascites: Secondary | ICD-10-CM

## 2019-05-14 DIAGNOSIS — Z79899 Other long term (current) drug therapy: Secondary | ICD-10-CM | POA: Diagnosis not present

## 2019-05-15 ENCOUNTER — Encounter (INDEPENDENT_AMBULATORY_CARE_PROVIDER_SITE_OTHER): Payer: Self-pay

## 2019-05-15 LAB — HEPATIC FUNCTION PANEL
ALT: 7 IU/L (ref 0–32)
AST: 24 IU/L (ref 0–40)
Albumin: 4.4 g/dL (ref 3.8–4.8)
Alkaline Phosphatase: 102 IU/L (ref 39–117)
Bilirubin Total: 0.7 mg/dL (ref 0.0–1.2)
Bilirubin, Direct: 0.29 mg/dL (ref 0.00–0.40)
Total Protein: 6.9 g/dL (ref 6.0–8.5)

## 2019-05-16 ENCOUNTER — Encounter (INDEPENDENT_AMBULATORY_CARE_PROVIDER_SITE_OTHER): Payer: Self-pay

## 2019-05-17 ENCOUNTER — Other Ambulatory Visit: Payer: Self-pay

## 2019-05-17 ENCOUNTER — Encounter: Payer: Self-pay | Admitting: Cardiology

## 2019-05-17 ENCOUNTER — Other Ambulatory Visit (INDEPENDENT_AMBULATORY_CARE_PROVIDER_SITE_OTHER): Payer: Medicare HMO

## 2019-05-17 ENCOUNTER — Telehealth: Payer: Self-pay | Admitting: Emergency Medicine

## 2019-05-17 ENCOUNTER — Ambulatory Visit: Payer: Medicare HMO | Admitting: Cardiology

## 2019-05-17 VITALS — BP 135/79 | HR 76 | Temp 97.1°F | Ht 65.0 in | Wt 104.6 lb

## 2019-05-17 DIAGNOSIS — R0602 Shortness of breath: Secondary | ICD-10-CM | POA: Diagnosis not present

## 2019-05-17 DIAGNOSIS — K7031 Alcoholic cirrhosis of liver with ascites: Secondary | ICD-10-CM

## 2019-05-17 DIAGNOSIS — I2510 Atherosclerotic heart disease of native coronary artery without angina pectoris: Secondary | ICD-10-CM | POA: Diagnosis not present

## 2019-05-17 DIAGNOSIS — Z720 Tobacco use: Secondary | ICD-10-CM

## 2019-05-17 LAB — BASIC METABOLIC PANEL
BUN: 16 mg/dL (ref 6–23)
CO2: 27 mEq/L (ref 19–32)
Calcium: 9.9 mg/dL (ref 8.4–10.5)
Chloride: 100 mEq/L (ref 96–112)
Creatinine, Ser: 0.77 mg/dL (ref 0.40–1.20)
GFR: 74.45 mL/min (ref >60.00)
Glucose, Bld: 141 mg/dL — ABNORMAL HIGH (ref 70–99)
Potassium: 4.4 mEq/L (ref 3.5–5.1)
Sodium: 134 mEq/L — ABNORMAL LOW (ref 135–145)

## 2019-05-17 NOTE — Progress Notes (Signed)
Cardiology Office Note:    Date:  05/27/2019   ID:  Christine Barton, DOB 08/13/1950, MRN [id number]  PCP:  [name], MD  Cardiologist:  No primary care provider on file.  Electrophysiologist:  None   Referring MD: [name], MD   No chief complaint on file.   History of Present Illness:    Christine Barton is a 69 y.o. female with a hx of cirrhosis c/b esophageal varices, tobacco use, hyperlipidemia, type 2 diabetes who presents for follow-up.  She was referred by Dr. Glori Bickers for evaluation of coronary artery calcifications seen on chest CT, with initial appointment on 02/13/2019.  She underwent a chest CT for lung cancer screening on 01/18/2019, which showed multivessel coronary artery calcifications.  Patient denies any chest pain.  States that her activity has been limited by back pain but has been doing physical therapy.  She does report intermittent dyspnea with exertion, which she states improves with inhaler use.  She continues to smoke 1 pack/day.  Had been taken off diuretics but had to restart.  TTE on 02/21/2019 showed normal LV systolic function, normal RV function, no significant valvular disease.  Since her last clinic visit, she reports that she continues to have dyspnea with exertion.  Denies any chest pain, lightheadedness, or syncope.  Has stationary bike, will exercise for 5 minutes.   Past Medical History:  Diagnosis Date  . Alcohol abuse, unspecified   . Breast cancer ([place]) 1998   Right  . Cataract    left eye  . Cervical spondylosis 2006   MRI  . Degeneration of cervical intervertebral disc 2006   MRI  . Diabetes mellitus without complication ([place])   . Diverticulosis of colon (without mention of hemorrhage)   . Hyperpotassemia   . Microscopic hematuria   . Mononeuritis of unspecified site   . Nonspecific abnormal results of liver function study   . Other abnormal glucose   . Other and unspecified hyperlipidemia    no per pt  . Other chronic  nonalcoholic liver disease   . Personal history of chemotherapy   . Personal history of malignant neoplasm of breast   . Personal history of radiation therapy   . Pneumothorax, acute    right, spontaneous  . Tobacco use disorder   . Unspecified vitamin D deficiency     Past Surgical History:  Procedure Laterality Date  . BREAST BIOPSY  9/03   Right  . BREAST LUMPECTOMY Right 1998  . BREAST LUMPECTOMY  1998  . CHEST TUBE INSERTION  11/02/2014  . COLONOSCOPY    . ESOPHAGOGASTRODUODENOSCOPY (EGD) WITH PROPOFOL N/A 10/30/2018   Procedure: ESOPHAGOGASTRODUODENOSCOPY (EGD) WITH PROPOFOL;  Surgeon: Mauri Pole, MD;  [facility];  Service: Endoscopy;  Laterality: N/A;  . ESOPHAGOGASTRODUODENOSCOPY (EGD) WITH PROPOFOL N/A 03/12/2019   Procedure: ESOPHAGOGASTRODUODENOSCOPY (EGD) WITH PROPOFOL;  Surgeon: Mauri Pole, MD;  [facility];  Service: Endoscopy;  Laterality: N/A;  . EYE SURGERY  02/2017   cataract extraction with lens implant-left  . TUBAL LIGATION      Current Medications: Current Meds  Medication Sig  . acetaminophen (TYLENOL) 500 MG tablet Take 500 mg by mouth 2 (two) times a day. Uses only as needed  . albuterol (VENTOLIN HFA) 108 (90 Base) MCG/ACT inhaler Inhale 2 puffs into the lungs every 4 (four) hours as needed for wheezing or shortness of breath.  . doxylamine, Sleep, (UNISOM) 25 MG tablet Take 25 mg by mouth at bedtime  as needed for sleep.   . furosemide (LASIX) 20 MG tablet Take daily  . lactulose (CHRONULAC) 10 GM/15ML solution Take 15 mLs (10 g total) by mouth 3 (three) times daily. (Patient taking differently: Take 20 g by mouth See admin instructions. 2 tablespoons daily)  . metFORMIN (GLUCOPHAGE XR) 500 MG 24 hr tablet Take 1 tablet (500 mg total) by mouth daily with breakfast.  . methocarbamol (ROBAXIN) 500 MG tablet Take 1 tablet (500 mg total) by mouth every 8 (eight) hours as needed for muscle spasms. Caution of sedation  .  Multiple Vitamin (MULTIVITAMIN) tablet Take 1 tablet by mouth daily.  . nadolol (CORGARD) 20 MG tablet Take 1 tablet (20 mg total) by mouth daily.  . pantoprazole (PROTONIX) 40 MG tablet TAKE 1 TABLET (40 MG TOTAL) BY MOUTH 2 (TWO) TIMES DAILY BEFORE A MEAL.  [name] Glycol-Propyl Glycol (SYSTANE OP) Place 1 drop into the left eye daily as needed (dryness).  Marland Kitchen spironolactone (ALDACTONE) 25 MG tablet Take 1 tablet (25 mg total) by mouth daily.  Marland Kitchen tiZANidine (ZANAFLEX) 2 MG tablet Take 2 mg by mouth at bedtime as needed.  . traMADol (ULTRAM) 50 MG tablet Take 50 mg by mouth daily as needed. At bedtime as needed  . XIFAXAN 550 MG TABS tablet Take 1 tablet (550 mg total) by mouth 2 (two) times daily.     Allergies:   Oxycodone and Glipizide   Social History   Socioeconomic History  . Marital status: Single    Spouse name: Not on file  . Number of children: 1  . Years of education: Not on file  . Highest education level: Not on file  Occupational History  . Occupation: retired    Fish farm manager: [employer]  Tobacco Use  . Smoking status: Current Every Day Smoker    Packs/day: 1.00    Years: 53.00    Pack years: 53.00    Types: Cigarettes  . Smokeless tobacco: Never Used  . Tobacco comment: tobacco info given 04/27/2018  Substance and Sexual Activity  . Alcohol use: Not Currently    Comment: occasional use  . Drug use: No  . Sexual activity: Not Currently  Other Topics Concern  . Not on file  Social History Narrative   Divorced      1 child      Works at [employer] Strain: [place]   . Difficulty of Paying Living Expenses: Not hard at all  Food Insecurity: No Food Insecurity  . Worried About Charity fundraiser in the Last Year: Never true  . Ran Out of Food in the Last Year: Never true  Transportation Needs: No Transportation Needs  . Lack of Transportation (Medical): No  . Lack of Transportation  (Non-Medical): No  Physical Activity: Inactive  . Days of Exercise per Week: 0 days  . Minutes of Exercise per Session: 0 min  Stress: No Stress Concern Present  . Feeling of Stress : Only a little  Social Connections:   . Frequency of Communication with Friends and Family: Not on file  . Frequency of Social Gatherings with Friends and Family: Not on file  . Attends Religious Services: Not on file  . Active Member of Clubs or Organizations: Not on file  . Attends Archivist Meetings: Not on file  . Marital Status: Not on file     Family History: The patient's \  family history includes Colon cancer in her maternal uncle; Heart attack in her father; Heart failure in her father; Stroke in her mother. There is no history of Esophageal cancer, Rectal cancer, or Stomach cancer.  ROS:   Please see the history of present illness.    \All other systems reviewed and are negative.  EKGs/Labs/Other Studies Reviewed:    The following studies were reviewed today:   EKG:  EKG is not ordered today.  The last ekg ordered demonstrates normal sinus rhythm, rate 66, no ST/T abnormalities  Recent Labs: 10/23/2018: TSH 1.95 02/12/2019: Hemoglobin 12.0; Platelets 135.0 05/14/2019: ALT 7 05/17/2019: BUN 16; Creatinine, Ser 0.77; Potassium 4.4; Sodium 134  Recent Lipid Panel    Component Value Date/Time   CHOL 128 11/06/2018 0839   TRIG 125.0 11/06/2018 0839   HDL 32.60 (L) 11/06/2018 0839   CHOLHDL 4 11/06/2018 0839   VLDL 25.0 11/06/2018 0839   LDLCALC 70 11/06/2018 0839   LDLDIRECT 83.0 10/31/2017 0838    Physical Exam:    VS:  BP 135/79   Pulse 76   Temp (!) 97.1 F (36.2 C)   Ht 5' 5"  (1.651 m)   Wt 104 lb 9.6 oz (47.4 kg)   SpO2 100%   BMI 17.41 kg/m     Wt Readings from Last 3 Encounters:  05/17/19 104 lb 9.6 oz (47.4 kg)  05/09/19 104 lb (47.2 kg)  03/30/19 99 lb 6 oz (45.1 kg)     GEN: Cachectic, in no acute distress HEENT: Normal NECK: No JVD LYMPHATICS: No  lymphadenopathy CARDIAC: RRR, no murmurs RESPIRATORY:  Clear to auscultation without rales, wheezing or rhonchi  ABDOMEN: Soft, non-tender, non-distended MUSCULOSKELETAL:  No edema SKIN: Warm and dry NEUROLOGIC:  Alert and oriented x 3 PSYCHIATRIC:  Normal affect   ASSESSMENT:    1. Coronary artery disease involving native coronary artery of native heart without angina pectoris   2. SOB (shortness of breath)   3. Tobacco use    PLAN:    In order of problems listed above:  Coronary artery disease: CT chest showed multivessel coronary calcifications.  She denies any chest pain.  She does report dyspnea on exertion, which could represent anginal equivalent -Last LDL 70 on 11/06/2018.  She appears compensated in regards to her cirrhosis.  Discussed with her hepatologist, Dr. [name], and OK to start on statin.  Started on rosuvastatin 5 mg daily in 02/2019.  LFTs from 05/14/2019 are stable -Would not start on daily aspirin given ongoing issues with bleeding from esophageal varices -Lexiscan Myoview to evaluate for ischemia given dyspnea on exertion  Dyspnea: Suspect related to COPD, as improves with inhaler use.  However could represent anginal equivalent as above.  Lexiscan Myoview ordered.  TTE on 02/21/2019 showed no structural heart disease.  Type 2 diabetes: Well-controlled, last A1c 6.7 on 02/07/2019  Tobacco use: Smokes 1ppd.  Patient counseled on risks of tobacco use and cessation strongly encouraged  RTC in 6 months  Medication Adjustments/Labs and Tests Ordered: Current medicines are reviewed at length with the patient today.  Concerns regarding medicines are outlined above.  Orders Placed This Encounter  Procedures  . MYOCARDIAL PERFUSION IMAGING   No orders of the defined types were placed in this encounter.   Patient Instructions  Medication Instructions:  Your physician recommends that you continue on your current medications as directed. Please refer to the Current  Medication list given to you today.  *If you need a refill on your cardiac medications  before your next appointment, please call your pharmacy*  Lab Work: NONE If you have labs (blood work) drawn today and your tests are completely normal, you will receive your results only by: Marland Kitchen MyChart Message (if you have MyChart) OR . A paper copy in the mail If you have any lab test that is abnormal or we need to change your treatment, we will call you to review the results.  Testing/Procedures: Your physician has requested that you have a lexiscan myoview. For further information please visit [email or web address]. Please follow instruction sheet, as given.   Follow-Up: At [facility], you and your health needs are our priority.  As part of our continuing mission to provide you with exceptional heart care, we have created designated Provider Care Teams.  These Care Teams include your primary Cardiologist (physician) and Advanced Practice Providers (APPs -  Physician Assistants and Nurse Practitioners) who all work together to provide you with the care you need, when you need it.  Your next appointment:   6 month(s)  The format for your next appointment:   Either In Person or Virtual  Provider:   Oswaldo Milian, MD      Signed, Donato Heinz, MD  05/27/2019 1:35 PM    [place]

## 2019-05-17 NOTE — Telephone Encounter (Signed)
Just an [place], Spoke with Santiago Glad in the [facility] lab and she states patient is a hard stick and they were unable to draw the BMP that was ordered after two attempts. She was supposed to return for them to try again and she did not return.

## 2019-05-17 NOTE — Patient Instructions (Signed)
Medication Instructions:  Your physician recommends that you continue on your current medications as directed. Please refer to the Current Medication list given to you today.  *If you need a refill on your cardiac medications before your next appointment, please call your pharmacy*  Lab Work: NONE If you have labs (blood work) drawn today and your tests are completely normal, you will receive your results only by: Marland Kitchen MyChart Message (if you have MyChart) OR . A paper copy in the mail If you have any lab test that is abnormal or we need to change your treatment, we will call you to review the results.  Testing/Procedures: Your physician has requested that you have a lexiscan myoview. For further information please visit [email or web address]. Please follow instruction sheet, as given.   Follow-Up: At [facility], you and your health needs are our priority.  As part of our continuing mission to provide you with exceptional heart care, we have created designated Provider Care Teams.  These Care Teams include your primary Cardiologist (physician) and Advanced Practice Providers (APPs -  Physician Assistants and Nurse Practitioners) who all work together to provide you with the care you need, when you need it.  Your next appointment:   6 month(s)  The format for your next appointment:   Either In Person or Virtual  Provider:   Oswaldo Milian, MD

## 2019-05-17 NOTE — Telephone Encounter (Signed)
Ok, can you please contact patient and remind her to come in.  Thanks

## 2019-05-18 NOTE — Telephone Encounter (Signed)
Patient went to lab yesterday afternoon.

## 2019-05-21 ENCOUNTER — Ambulatory Visit: Payer: Medicare HMO | Attending: Internal Medicine

## 2019-05-21 DIAGNOSIS — Z23 Encounter for immunization: Secondary | ICD-10-CM | POA: Insufficient documentation

## 2019-05-21 NOTE — Progress Notes (Signed)
   Covid-19 Vaccination Clinic  Name:  TZIPORA MCINROY    MRN: [id number] DOB: Jan 28, 1951  05/21/2019  Ms. Lengyel was observed post Covid-19 immunization for 15 minutes without incidence. She was provided with Vaccine Information Sheet and instruction to access the V-Safe system.   Ms. Dinger was instructed to call 911 with any severe reactions post vaccine: Marland Kitchen Difficulty breathing  . Swelling of your face and throat  . A fast heartbeat  . A bad rash all over your body  . Dizziness and weakness    Immunizations Administered    Name Date Dose VIS Date Route   Pfizer COVID-19 Vaccine 05/21/2019  5:01 PM 0.3 mL 03/23/2019 Intramuscular   Manufacturer: [place]   Lot: [id number]   [place]: [address]-4

## 2019-05-23 ENCOUNTER — Telehealth: Payer: Self-pay | Admitting: Gastroenterology

## 2019-05-23 NOTE — Telephone Encounter (Signed)
Great News! The Xifaxan will be available to her for about $10 a month. She wanted to say thank you to Dr [name], Shirlean Mylar and [facility] for helping with this.She can now take the Xifaxan daily as prescribed.

## 2019-05-23 NOTE — Telephone Encounter (Signed)
Thanks

## 2019-05-25 ENCOUNTER — Inpatient Hospital Stay (HOSPITAL_COMMUNITY): Admission: RE | Admit: 2019-05-25 | Payer: Medicare HMO | Source: Ambulatory Visit

## 2019-05-28 ENCOUNTER — Other Ambulatory Visit: Payer: Self-pay | Admitting: Gastroenterology

## 2019-05-28 DIAGNOSIS — K7031 Alcoholic cirrhosis of liver with ascites: Secondary | ICD-10-CM

## 2019-05-29 ENCOUNTER — Ambulatory Visit
Admission: RE | Admit: 2019-05-29 | Discharge: 2019-05-29 | Disposition: A | Discharge status: Home / Self Care | Payer: Medicare HMO | Source: Ambulatory Visit | Attending: Gastroenterology | Admitting: Gastroenterology

## 2019-05-29 DIAGNOSIS — K7031 Alcoholic cirrhosis of liver with ascites: Secondary | ICD-10-CM

## 2019-05-29 DIAGNOSIS — K8020 Calculus of gallbladder without cholecystitis without obstruction: Secondary | ICD-10-CM | POA: Diagnosis not present

## 2019-05-29 DIAGNOSIS — K7689 Other specified diseases of liver: Secondary | ICD-10-CM | POA: Diagnosis not present

## 2019-05-29 DIAGNOSIS — R188 Other ascites: Secondary | ICD-10-CM | POA: Diagnosis not present

## 2019-06-14 ENCOUNTER — Other Ambulatory Visit: Payer: Self-pay

## 2019-06-14 ENCOUNTER — Other Ambulatory Visit (INDEPENDENT_AMBULATORY_CARE_PROVIDER_SITE_OTHER): Payer: Medicare HMO

## 2019-06-14 DIAGNOSIS — K7031 Alcoholic cirrhosis of liver with ascites: Secondary | ICD-10-CM

## 2019-06-14 LAB — BASIC METABOLIC PANEL
BUN: 24 mg/dL — ABNORMAL HIGH (ref 6–23)
CO2: 25 mEq/L (ref 19–32)
Calcium: 9.8 mg/dL (ref 8.4–10.5)
Chloride: 101 mEq/L (ref 96–112)
Creatinine, Ser: 0.78 mg/dL (ref 0.40–1.20)
GFR: 73.34 mL/min (ref >60.00)
Glucose, Bld: 99 mg/dL (ref 70–99)
Potassium: 4.7 mEq/L (ref 3.5–5.1)
Sodium: 133 mEq/L — ABNORMAL LOW (ref 135–145)

## 2019-06-16 ENCOUNTER — Ambulatory Visit: Payer: Medicare HMO | Attending: Internal Medicine

## 2019-06-16 DIAGNOSIS — Z23 Encounter for immunization: Secondary | ICD-10-CM

## 2019-06-16 NOTE — Progress Notes (Signed)
   Covid-19 Vaccination Clinic  Name:  VARIE MACHAMER    MRN: [id number] DOB: January 14, 1951  06/16/2019  Ms. Hudock was observed post Covid-19 immunization for 15 minutes without incident. She was provided with Vaccine Information Sheet and instruction to access the V-Safe system.   Ms. Egner was instructed to call 911 with any severe reactions post vaccine: Marland Kitchen Difficulty breathing  . Swelling of face and throat  . A fast heartbeat  . A bad rash all over body  . Dizziness and weakness   Immunizations Administered    Name Date Dose VIS Date Route   Pfizer COVID-19 Vaccine 06/16/2019 11:42 AM 0.3 mL 03/23/2019 Intramuscular   Manufacturer: [place]   Lot: [id number]   [place]: [address]-4

## 2019-06-18 ENCOUNTER — Other Ambulatory Visit: Payer: Medicare HMO

## 2019-07-04 ENCOUNTER — Telehealth: Payer: Self-pay | Admitting: Gastroenterology

## 2019-07-04 NOTE — Telephone Encounter (Signed)
Pt stated that she has cirrhosis and is taking APAP.  She inquired whether it will be better to take ibuprofen instead.

## 2019-07-05 NOTE — Telephone Encounter (Signed)
I talked with her and went over these recommendations.  She is dealing with insomnia. She practices good sleep hygiene from what she is telling me. No daytime naps. She has stayed away from caffeine. What is a safe alternative for her. She states "I am miserable. I am dog tired, but do not sleep."

## 2019-07-05 NOTE — Telephone Encounter (Signed)
What do you advise for OTC pain control?

## 2019-07-05 NOTE — Telephone Encounter (Signed)
She can take Tylenol up to 2 g daily in divided doses as needed.  Avoid NSAIDs if possible and use ibuprofen only sparingly.  Thank you

## 2019-07-06 ENCOUNTER — Telehealth: Payer: Self-pay | Admitting: Family Medicine

## 2019-07-06 MED ORDER — TRAZODONE HCL 50 MG PO TABS: 25.0000 mg | ORAL_TABLET | Freq: Every evening | ORAL | 3 refills | Status: DC | PRN

## 2019-07-06 NOTE — Telephone Encounter (Signed)
Most of the sleep medication are metabolized by liver but it may be ok to start a low dose and see how she does. She can request Dr Glori Bickers to prescribe her a medication for insomnia. Thanks

## 2019-07-06 NOTE — Telephone Encounter (Signed)
Pt wants a Rx med that is "okay with her liver issues." pt was told that benadryl and melatonin and OTC productions are not good for her liver so she hasn't tried anything. Pt said that she doesn't snore that she is aware of her sleep is just "off" she doesn't have any new stressors or anxiety, she is just having a hard time sleeping

## 2019-07-06 NOTE — Telephone Encounter (Signed)
Pt said that she talked with GI (see 07/04/19 phone note) and she advised them she is having a hard time with sleep. GI advise her that they are okay with her taking a "low dose" sleep med but she needs to contact PCP for med. Pt said she is having a hard time going to sleep but she also has a hard time staying a sleep. Pt request some type of sleep med sent to her pharmacy   [facility]

## 2019-07-06 NOTE — Telephone Encounter (Signed)
Med list updated, pt notified Rx sent to pharmacy and advised of Dr. Marliss Coots comments

## 2019-07-06 NOTE — Telephone Encounter (Signed)
Pt called wanting to get shapale to call her   Personal didn't want to discuss with me

## 2019-07-06 NOTE — Telephone Encounter (Signed)
Has she tried anything over the counter like benadryl or z quil or sominex?  How about melatonin and volarian?  (these are herbal products)  Does she snore or wake herself snoring  What is her anxiety level like lately? , any recent new stresses?   Thanks

## 2019-07-06 NOTE — Telephone Encounter (Signed)
I pended an order for trazodone  Caution of sedation and falls and use as needed  Avoid caffeine   Before sending it-please review her med list with her and eliminate things she is not taking  Acetaminophen is on there-she should not take  Also unisom is on there  Etc Unsure about her tizanidine or tramadol which should not be mixed with sleep medication   Thanks so much

## 2019-07-06 NOTE — Telephone Encounter (Signed)
Advised patient to contact Dr Glori Bickers and ask for medication. She will continue to practice good sleep hygiene and avoid daytime napping.

## 2019-07-10 ENCOUNTER — Telehealth: Payer: Self-pay | Admitting: Family Medicine

## 2019-07-10 NOTE — Telephone Encounter (Signed)
Pt called wanting shapale to call her back regarding  Pt is on amoxicillin 553m 3 times daily  for a dental procedure that was done on 3/24  She stated she pulled a tick off of her collar bone 3/28.  It is red now and swollen.  She is worried about taking amoxicilln for the tick bite incase it is not right antibiotic.  Please advise

## 2019-07-10 NOTE — Telephone Encounter (Signed)
Fortunately a red swollen tick bite does not necessarily mean tick fever or necessary abx.  Please schedule with first available for eval since I am out of town this week Sorry- thanks  If fever or other symptoms-adv ER or UC iv necessary

## 2019-07-11 NOTE — Telephone Encounter (Signed)
Pt notified of Dr. Marliss Coots comments. Pt said that the tick bite itself looks good the redness and swelling has resolved it's just a little itchy but that is improving. Pt just wanted to make sure she can still take abx with recent tick bite. Pt will keep taking meds and updated [place] if it starts looking bad

## 2019-07-24 ENCOUNTER — Telehealth: Payer: Self-pay | Admitting: Gastroenterology

## 2019-07-24 ENCOUNTER — Other Ambulatory Visit (INDEPENDENT_AMBULATORY_CARE_PROVIDER_SITE_OTHER): Payer: Medicare HMO

## 2019-07-24 ENCOUNTER — Encounter: Payer: Self-pay | Admitting: Gastroenterology

## 2019-07-24 ENCOUNTER — Ambulatory Visit: Payer: Medicare HMO | Admitting: Gastroenterology

## 2019-07-24 VITALS — BP 116/50 | HR 92 | Temp 98.5°F | Ht 64.5 in | Wt 104.0 lb

## 2019-07-24 DIAGNOSIS — K766 Portal hypertension: Secondary | ICD-10-CM

## 2019-07-24 DIAGNOSIS — K7682 Hepatic encephalopathy: Secondary | ICD-10-CM

## 2019-07-24 DIAGNOSIS — K7031 Alcoholic cirrhosis of liver with ascites: Secondary | ICD-10-CM | POA: Diagnosis not present

## 2019-07-24 DIAGNOSIS — K7290 Hepatic failure, unspecified without coma: Secondary | ICD-10-CM

## 2019-07-24 DIAGNOSIS — K3189 Other diseases of stomach and duodenum: Secondary | ICD-10-CM

## 2019-07-24 LAB — CBC WITH DIFFERENTIAL/PLATELET
Basophils Absolute: 0.1 10*3/uL (ref 0.0–0.1)
Basophils Relative: 0.8 % (ref 0.0–3.0)
Eosinophils Absolute: 0.2 10*3/uL (ref 0.0–0.7)
Eosinophils Relative: 1.8 % (ref 0.0–5.0)
HCT: 21.3 % — CL (ref 36.0–46.0)
Hemoglobin: 6.5 g/dL — CL (ref 12.0–15.0)
Lymphocytes Relative: 25.9 % (ref 12.0–46.0)
Lymphs Abs: 2.1 10*3/uL (ref 0.7–4.0)
MCHC: 30.3 g/dL (ref 30.0–36.0)
MCV: 64.9 fl — ABNORMAL LOW (ref 78.0–100.0)
Monocytes Absolute: 0.8 10*3/uL (ref 0.1–1.0)
Monocytes Relative: 9.1 % (ref 3.0–12.0)
Neutro Abs: 5.2 10*3/uL (ref 1.4–7.7)
Neutrophils Relative %: 62.4 % (ref 43.0–77.0)
Platelets: 231 10*3/uL (ref 150.0–400.0)
RBC: 3.28 Mil/uL — ABNORMAL LOW (ref 3.87–5.11)
RDW: 18.1 % — ABNORMAL HIGH (ref 11.5–15.5)
WBC: 8.3 10*3/uL (ref 4.0–10.5)

## 2019-07-24 LAB — COMPREHENSIVE METABOLIC PANEL
ALT: 8 U/L (ref 0–35)
AST: 18 U/L (ref 0–37)
Albumin: 4.2 g/dL (ref 3.5–5.2)
Alkaline Phosphatase: 116 U/L (ref 39–117)
BUN: 25 mg/dL — ABNORMAL HIGH (ref 6–23)
CO2: 25 mEq/L (ref 19–32)
Calcium: 9.8 mg/dL (ref 8.4–10.5)
Chloride: 103 mEq/L (ref 96–112)
Creatinine, Ser: 0.73 mg/dL (ref 0.40–1.20)
GFR: 79.14 mL/min (ref >60.00)
Glucose, Bld: 168 mg/dL — ABNORMAL HIGH (ref 70–99)
Potassium: 4.3 mEq/L (ref 3.5–5.1)
Sodium: 135 mEq/L (ref 135–145)
Total Bilirubin: 0.5 mg/dL (ref 0.2–1.2)
Total Protein: 7.3 g/dL (ref 6.0–8.3)

## 2019-07-24 LAB — PROTIME-INR
INR: 1.2 ratio — ABNORMAL HIGH (ref 0.8–1.0)
Prothrombin Time: 13.4 s — ABNORMAL HIGH (ref 9.6–13.1)

## 2019-07-24 NOTE — Patient Instructions (Addendum)
If you are age 69 or older, your body mass index should be between 23-30. Your Body mass index is 17.58 kg/m. If this is out of the aforementioned range listed, please consider follow up with your Primary Care Provider.  If you are age 2 or younger, your body mass index should be between 19-25. Your Body mass index is 17.58 kg/m. If this is out of the aformentioned range listed, please consider follow up with your Primary Care Provider.   Due to recent changes in healthcare laws, you may see the results of your imaging and laboratory studies on MyChart before your provider has had a chance to review them.  We understand that in some cases there may be results that are confusing or concerning to you. Not all laboratory results come back in the same time frame and the provider may be waiting for multiple results in order to interpret others.  Please give [place] 48 hours in order for your provider to thoroughly review all the results before contacting the office for clarification of your results.    Your provider has requested that you go to the basement level for lab work before leaving today. Press "B" on the elevator. The lab is located at the first door on the left as you exit the elevator.  Decrease Lactulose to 1 teaspoon twice daily.   Continue Xifaxan.   Continue Lasix 22m once daily.   Continue Aldactone 232m every other day.   Keep follow up appointment in 3 months.   Thank you for choosing me and [facility].  Dr. NaSilverio Decamp

## 2019-07-24 NOTE — Telephone Encounter (Signed)
Received page to on call. Patient with hx of cirrhosis, seen earlier today by Dr. [name] and routine labs notable for significant microcytic anemia, with H/H 6.5/21.3, MCV 64.9 (was 12/35.7 in 02/2019 with MCV 89). Dr. [name] had left message for patient with instructions to go to [facility] ER for eval and transfusion.   She is o/w w/o overt GI blood loss and o/w feels in her usual state of health. Hx of small grade 1 esophageal varices and PHG on EGD in 02/2019.   Patient actually received a call from Dr. [name] during my call with her, and will follow her advice. Should patient go to ER tonight/tomorrow AM, recommend iron panel, B12, folate, pRBCs, and will certainly need expedited endoscopic evaluation.   All questions answered and appreciative of call back.

## 2019-07-24 NOTE — Progress Notes (Signed)
Christine Barton    [id number]    09-23-1950  Primary Care Physician:Tower, [name], MD  Referring Physician: Tower, [name], MD [place],  [place] [id number]   Chief complaint: Cirrhosis HPI: 69 year old female with alcoholic and Christine Barton cirrhosis here for follow-up visit  She is feeling well overall.  Denies any melena, rectal bleeding, abdominal pain, nausea, vomiting, abdominal distention or lower leg swelling.  Xifaxan is helping with her bowel habits, are regular with no constipation or diarrhea.  She has intermittent sharp left lower quadrant discomfort that is very transient usually lasts for a few seconds and spontaneously resolves.  She is still taking lactulose 1 tablespoon twice daily. No confusion, daytime sleepiness or altered mood.  EGD February 23, 2019: Small grade 1 esophageal varices, portal hypertensive gastropathy  Colonoscopy November 30, 2016: Diverticulosis, sessile polyps X4 tubular adenomas and internal hemorrhoids.  Recall colonoscopy in 3 years  Outpatient Encounter Medications as of 07/24/2019  Medication Sig  . albuterol (VENTOLIN HFA) 108 (90 Base) MCG/ACT inhaler Inhale 2 puffs into the lungs every 4 (four) hours as needed for wheezing or shortness of breath.  . furosemide (LASIX) 20 MG tablet Take daily  . lactulose (CHRONULAC) 10 GM/15ML solution Take 15 mLs (10 g total) by mouth 3 (three) times daily. (Patient taking differently: Take 20 g by mouth See admin instructions. 2 tablespoons daily)  . metFORMIN (GLUCOPHAGE XR) 500 MG 24 hr tablet Take 1 tablet (500 mg total) by mouth daily with breakfast.  . Multiple Vitamin (MULTIVITAMIN) tablet Take 1 tablet by mouth daily.  . nadolol (CORGARD) 20 MG tablet Take 1 tablet (20 mg total) by mouth daily.  . pantoprazole (PROTONIX) 40 MG tablet TAKE 1 TABLET (40 MG TOTAL) BY MOUTH 2 (TWO) TIMES DAILY BEFORE A MEAL.  [name] Glycol-Propyl Glycol (SYSTANE OP) Place 1 drop into the  left eye daily as needed (dryness).  . rosuvastatin (CRESTOR) 5 MG tablet Take 1 tablet (5 mg total) by mouth daily at 6 PM.  . spironolactone (ALDACTONE) 25 MG tablet Take 1 tablet (25 mg total) by mouth daily.  . traZODone (DESYREL) 50 MG tablet Take 0.5-1 tablets (25-50 mg total) by mouth at bedtime as needed for sleep.  Marland Kitchen XIFAXAN 550 MG TABS tablet Take 1 tablet (550 mg total) by mouth 2 (two) times daily.   No facility-administered encounter medications on file as of 07/24/2019.    Allergies as of 07/24/2019 - Review Complete 07/24/2019  Allergen Reaction Noted  . Oxycodone Nausea And Vomiting 11/02/2014  . Glipizide Other (See Comments) 09/12/2013    Past Medical History:  Diagnosis Date  . Alcohol abuse, unspecified   . Breast cancer ([place]) 1998   Right  . Cataract    left eye  . Cervical spondylosis 2006   MRI  . Degeneration of cervical intervertebral disc 2006   MRI  . Diabetes mellitus without complication ([place])   . Diverticulosis of colon (without mention of hemorrhage)   . Hyperpotassemia   . Microscopic hematuria   . Mononeuritis of unspecified site   . Nonspecific abnormal results of liver function study   . Other abnormal glucose   . Other and unspecified hyperlipidemia    no per pt  . Other chronic nonalcoholic liver disease   . Personal history of chemotherapy   . Personal history of malignant neoplasm of breast   . Personal history of radiation therapy   .  Pneumothorax, acute    right, spontaneous  . Tobacco use disorder   . Unspecified vitamin D deficiency     Past Surgical History:  Procedure Laterality Date  . BREAST BIOPSY  9/03   Right  . BREAST LUMPECTOMY Right 1998  . BREAST LUMPECTOMY  1998  . CHEST TUBE INSERTION  11/02/2014  . COLONOSCOPY    . ESOPHAGOGASTRODUODENOSCOPY (EGD) WITH PROPOFOL N/A 10/30/2018   Procedure: ESOPHAGOGASTRODUODENOSCOPY (EGD) WITH PROPOFOL;  Surgeon: Mauri Pole, MD;  Location: [facility] ENDOSCOPY;  Service:  Endoscopy;  Laterality: N/A;  . ESOPHAGOGASTRODUODENOSCOPY (EGD) WITH PROPOFOL N/A 03/12/2019   Procedure: ESOPHAGOGASTRODUODENOSCOPY (EGD) WITH PROPOFOL;  Surgeon: Mauri Pole, MD;  Location: [facility] ENDOSCOPY;  Service: Endoscopy;  Laterality: N/A;  . EYE SURGERY  02/2017   cataract extraction with lens implant-left  . MOUTH SURGERY    . TUBAL LIGATION      Family History  Problem Relation Age of Onset  . Heart failure Father   . Heart attack Father   . Colon cancer Maternal Uncle   . Stroke Mother   . Esophageal cancer Neg Hx   . Rectal cancer Neg Hx   . Stomach cancer Neg Hx     Social History   Socioeconomic History  . Marital status: Single    Spouse name: Not on file  . Number of children: 1  . Years of education: Not on file  . Highest education level: Not on file  Occupational History  . Occupation: retired    Fish farm manager: [employer]  Tobacco Use  . Smoking status: Current Every Day Smoker    Packs/day: 1.00    Years: 53.00    Pack years: 53.00    Types: Cigarettes  . Smokeless tobacco: Never Used  . Tobacco comment: tobacco info given 04/27/2018  Substance and Sexual Activity  . Alcohol use: Not Currently  . Drug use: No  . Sexual activity: Not Currently  Other Topics Concern  . Not on file  Social History Narrative   Divorced      1 child      Works at [employer] Strain: [place]   . Difficulty of Paying Living Expenses: Not hard at all  Food Insecurity: No Food Insecurity  . Worried About Charity fundraiser in the Last Year: Never true  . Ran Out of Food in the Last Year: Never true  Transportation Needs: No Transportation Needs  . Lack of Transportation (Medical): No  . Lack of Transportation (Non-Medical): No  Physical Activity: Inactive  . Days of Exercise per Week: 0 days  . Minutes of Exercise per Session: 0 min  Stress: No Stress Concern Present  . Feeling of  Stress : Only a little  Social Connections:   . Frequency of Communication with Friends and Family:   . Frequency of Social Gatherings with Friends and Family:   . Attends Religious Services:   . Active Member of Clubs or Organizations:   . Attends Archivist Meetings:   Marland Kitchen Marital Status:   Intimate Partner Violence: Not At Risk  . Fear of Current or Ex-Partner: No  . Emotionally Abused: No  . Physically Abused: No  . Sexually Abused: No      Review of systems: All other review of systems negative except as mentioned in the HPI.   Physical Exam: Vitals:   07/24/19 1328  BP: Marland Kitchen)  116/50  Pulse: 92  Temp: 98.5 F (36.9 C)   Body mass index is 17.58 kg/m. Gen:      No acute distress Abd:      + bowel sounds; soft, non-tender; no palpable masses, no distension Ext:    No edema; adequate peripheral perfusion Neuro: alert and oriented x 3 Psych: normal mood and affect  Data Reviewed:  Reviewed labs, radiology imaging, old records and pertinent past GI work up   Assessment and Plan/Recommendations: 69 year old female with history of breast cancer, diabetes, alcohol induced and NASH cirrhosis decompensated by ascites and hepatic encephalopathy  Meld score 8  Check CBC, CMP, PT and INR  Volume status: Stable Ascites and peripheral edema improved Continue Lasix 20 mg daily and Aldactone 25 mg every other day  Hepatic encephalopathy: Stable Continue Xifaxan twice daily Will decrease lactulose to 1 teaspoon twice daily with goal 2-3 soft bowel movements daily  HCC screening: MRI liver September 2020 no hepatic lesions concerning for hepatocellular carcinoma.  AFP within normal limits.  Return in 3 months or sooner if needed   This visit required 30 minutes of patient care (this includes precharting, chart review, review of results, face-to-face time used for counseling as well as treatment plan and follow-up. The patient was provided an opportunity to ask  questions and all were answered. The patient agreed with the plan and demonstrated an understanding of the instructions.  Damaris Hippo , MD    CC: Tower, [name], MD

## 2019-07-24 NOTE — Telephone Encounter (Signed)
Thank you [employer]

## 2019-07-25 ENCOUNTER — Observation Stay (HOSPITAL_COMMUNITY): Payer: Medicare HMO

## 2019-07-25 ENCOUNTER — Inpatient Hospital Stay (HOSPITAL_COMMUNITY)
Admission: EM | Admit: 2019-07-25 | Discharge: 2019-07-27 | DRG: 393 | Discharge status: Against Medical Advice | Payer: Medicare HMO | Attending: Internal Medicine | Admitting: Internal Medicine

## 2019-07-25 ENCOUNTER — Other Ambulatory Visit: Payer: Self-pay

## 2019-07-25 ENCOUNTER — Encounter (HOSPITAL_COMMUNITY): Payer: Self-pay

## 2019-07-25 DIAGNOSIS — R918 Other nonspecific abnormal finding of lung field: Secondary | ICD-10-CM | POA: Diagnosis not present

## 2019-07-25 DIAGNOSIS — I2510 Atherosclerotic heart disease of native coronary artery without angina pectoris: Secondary | ICD-10-CM | POA: Diagnosis present

## 2019-07-25 DIAGNOSIS — D123 Benign neoplasm of transverse colon: Secondary | ICD-10-CM | POA: Diagnosis not present

## 2019-07-25 DIAGNOSIS — Z720 Tobacco use: Secondary | ICD-10-CM | POA: Diagnosis not present

## 2019-07-25 DIAGNOSIS — I8510 Secondary esophageal varices without bleeding: Secondary | ICD-10-CM | POA: Diagnosis present

## 2019-07-25 DIAGNOSIS — D509 Iron deficiency anemia, unspecified: Secondary | ICD-10-CM | POA: Diagnosis present

## 2019-07-25 DIAGNOSIS — Z7984 Long term (current) use of oral hypoglycemic drugs: Secondary | ICD-10-CM | POA: Diagnosis not present

## 2019-07-25 DIAGNOSIS — E1169 Type 2 diabetes mellitus with other specified complication: Secondary | ICD-10-CM | POA: Diagnosis present

## 2019-07-25 DIAGNOSIS — K7581 Nonalcoholic steatohepatitis (NASH): Secondary | ICD-10-CM | POA: Diagnosis not present

## 2019-07-25 DIAGNOSIS — I10 Essential (primary) hypertension: Secondary | ICD-10-CM | POA: Diagnosis present

## 2019-07-25 DIAGNOSIS — Z800 Family history of malignant neoplasm of digestive organs: Secondary | ICD-10-CM

## 2019-07-25 DIAGNOSIS — Z923 Personal history of irradiation: Secondary | ICD-10-CM

## 2019-07-25 DIAGNOSIS — F1010 Alcohol abuse, uncomplicated: Secondary | ICD-10-CM | POA: Diagnosis present

## 2019-07-25 DIAGNOSIS — E118 Type 2 diabetes mellitus with unspecified complications: Secondary | ICD-10-CM | POA: Diagnosis not present

## 2019-07-25 DIAGNOSIS — E785 Hyperlipidemia, unspecified: Secondary | ICD-10-CM | POA: Diagnosis present

## 2019-07-25 DIAGNOSIS — Z853 Personal history of malignant neoplasm of breast: Secondary | ICD-10-CM | POA: Diagnosis not present

## 2019-07-25 DIAGNOSIS — E1141 Type 2 diabetes mellitus with diabetic mononeuropathy: Secondary | ICD-10-CM | POA: Diagnosis present

## 2019-07-25 DIAGNOSIS — E43 Unspecified severe protein-calorie malnutrition: Secondary | ICD-10-CM | POA: Insufficient documentation

## 2019-07-25 DIAGNOSIS — Z885 Allergy status to narcotic agent status: Secondary | ICD-10-CM

## 2019-07-25 DIAGNOSIS — E559 Vitamin D deficiency, unspecified: Secondary | ICD-10-CM | POA: Diagnosis present

## 2019-07-25 DIAGNOSIS — D649 Anemia, unspecified: Secondary | ICD-10-CM | POA: Diagnosis not present

## 2019-07-25 DIAGNOSIS — K7460 Unspecified cirrhosis of liver: Secondary | ICD-10-CM | POA: Diagnosis present

## 2019-07-25 DIAGNOSIS — R0600 Dyspnea, unspecified: Secondary | ICD-10-CM

## 2019-07-25 DIAGNOSIS — F17210 Nicotine dependence, cigarettes, uncomplicated: Secondary | ICD-10-CM

## 2019-07-25 DIAGNOSIS — Z8249 Family history of ischemic heart disease and other diseases of the circulatory system: Secondary | ICD-10-CM | POA: Diagnosis not present

## 2019-07-25 DIAGNOSIS — Z20822 Contact with and (suspected) exposure to covid-19: Secondary | ICD-10-CM | POA: Diagnosis present

## 2019-07-25 DIAGNOSIS — K2970 Gastritis, unspecified, without bleeding: Secondary | ICD-10-CM | POA: Diagnosis present

## 2019-07-25 DIAGNOSIS — Z03818 Encounter for observation for suspected exposure to other biological agents ruled out: Secondary | ICD-10-CM | POA: Diagnosis not present

## 2019-07-25 DIAGNOSIS — Z681 Body mass index (BMI) 19 or less, adult: Secondary | ICD-10-CM

## 2019-07-25 DIAGNOSIS — D120 Benign neoplasm of cecum: Secondary | ICD-10-CM | POA: Diagnosis not present

## 2019-07-25 DIAGNOSIS — F1011 Alcohol abuse, in remission: Secondary | ICD-10-CM | POA: Diagnosis present

## 2019-07-25 DIAGNOSIS — R7303 Prediabetes: Secondary | ICD-10-CM | POA: Diagnosis present

## 2019-07-25 DIAGNOSIS — Z9221 Personal history of antineoplastic chemotherapy: Secondary | ICD-10-CM

## 2019-07-25 DIAGNOSIS — Z823 Family history of stroke: Secondary | ICD-10-CM | POA: Diagnosis not present

## 2019-07-25 DIAGNOSIS — D500 Iron deficiency anemia secondary to blood loss (chronic): Secondary | ICD-10-CM

## 2019-07-25 DIAGNOSIS — D126 Benign neoplasm of colon, unspecified: Secondary | ICD-10-CM

## 2019-07-25 DIAGNOSIS — K5520 Angiodysplasia of colon without hemorrhage: Secondary | ICD-10-CM

## 2019-07-25 DIAGNOSIS — K7030 Alcoholic cirrhosis of liver without ascites: Secondary | ICD-10-CM | POA: Diagnosis present

## 2019-07-25 DIAGNOSIS — I8500 Esophageal varices without bleeding: Secondary | ICD-10-CM | POA: Diagnosis present

## 2019-07-25 DIAGNOSIS — K5790 Diverticulosis of intestine, part unspecified, without perforation or abscess without bleeding: Secondary | ICD-10-CM | POA: Diagnosis present

## 2019-07-25 DIAGNOSIS — K5730 Diverticulosis of large intestine without perforation or abscess without bleeding: Secondary | ICD-10-CM | POA: Diagnosis present

## 2019-07-25 DIAGNOSIS — J449 Chronic obstructive pulmonary disease, unspecified: Secondary | ICD-10-CM | POA: Diagnosis present

## 2019-07-25 DIAGNOSIS — I700 Atherosclerosis of aorta: Secondary | ICD-10-CM | POA: Diagnosis present

## 2019-07-25 DIAGNOSIS — E46 Unspecified protein-calorie malnutrition: Secondary | ICD-10-CM | POA: Diagnosis present

## 2019-07-25 DIAGNOSIS — K766 Portal hypertension: Secondary | ICD-10-CM | POA: Diagnosis present

## 2019-07-25 DIAGNOSIS — D125 Benign neoplasm of sigmoid colon: Secondary | ICD-10-CM | POA: Diagnosis not present

## 2019-07-25 DIAGNOSIS — Z888 Allergy status to other drugs, medicaments and biological substances status: Secondary | ICD-10-CM

## 2019-07-25 DIAGNOSIS — E119 Type 2 diabetes mellitus without complications: Secondary | ICD-10-CM | POA: Diagnosis present

## 2019-07-25 DIAGNOSIS — K635 Polyp of colon: Secondary | ICD-10-CM | POA: Diagnosis not present

## 2019-07-25 LAB — CBC
HCT: 23.2 % — ABNORMAL LOW (ref 36.0–46.0)
HCT: 27.3 % — ABNORMAL LOW (ref 36.0–46.0)
Hemoglobin: 6.5 g/dL — CL (ref 12.0–15.0)
Hemoglobin: 7.6 g/dL — ABNORMAL LOW (ref 12.0–15.0)
MCH: 19.7 pg — ABNORMAL LOW (ref 26.0–34.0)
MCH: 20.7 pg — ABNORMAL LOW (ref 26.0–34.0)
MCHC: 27.8 g/dL — ABNORMAL LOW (ref 30.0–36.0)
MCHC: 28 g/dL — ABNORMAL LOW (ref 30.0–36.0)
MCV: 70.3 fL — ABNORMAL LOW (ref 80.0–100.0)
MCV: 74.2 fL — ABNORMAL LOW (ref 80.0–100.0)
Platelets: 210 10*3/uL (ref 150–400)
Platelets: 242 10*3/uL (ref 150–400)
RBC: 3.3 MIL/uL — ABNORMAL LOW (ref 3.87–5.11)
RBC: 3.68 MIL/uL — ABNORMAL LOW (ref 3.87–5.11)
RDW: 17.2 % — ABNORMAL HIGH (ref 11.5–15.5)
RDW: 19.2 % — ABNORMAL HIGH (ref 11.5–15.5)
WBC: 7.6 10*3/uL (ref 4.0–10.5)
WBC: 9 10*3/uL (ref 4.0–10.5)
nRBC: 0 % (ref 0.0–0.2)
nRBC: 0 % (ref 0.0–0.2)

## 2019-07-25 LAB — COMPREHENSIVE METABOLIC PANEL
ALT: 12 U/L (ref 0–44)
AST: 25 U/L (ref 15–41)
Albumin: 4 g/dL (ref 3.5–5.0)
Alkaline Phosphatase: 93 U/L (ref 38–126)
Anion gap: 9 (ref 5–15)
BUN: 27 mg/dL — ABNORMAL HIGH (ref 8–23)
CO2: 25 mmol/L (ref 22–32)
Calcium: 9.6 mg/dL (ref 8.9–10.3)
Chloride: 105 mmol/L (ref 98–111)
Creatinine, Ser: 0.75 mg/dL (ref 0.44–1.00)
GFR calc Af Amer: >60 mL/min (ref >60)
GFR calc non Af Amer: >60 mL/min (ref >60)
Glucose, Bld: 117 mg/dL — ABNORMAL HIGH (ref 70–99)
Potassium: 4 mmol/L (ref 3.5–5.1)
Sodium: 139 mmol/L (ref 135–145)
Total Bilirubin: 0.7 mg/dL (ref 0.3–1.2)
Total Protein: 7.6 g/dL (ref 6.5–8.1)

## 2019-07-25 LAB — URINALYSIS, ROUTINE W REFLEX MICROSCOPIC
Bilirubin Urine: NEGATIVE
Glucose, UA: NEGATIVE mg/dL
Hgb urine dipstick: NEGATIVE
Ketones, ur: NEGATIVE mg/dL
Leukocytes,Ua: NEGATIVE
Nitrite: NEGATIVE
Protein, ur: NEGATIVE mg/dL
Specific Gravity, Urine: 1.006 (ref 1.005–1.030)
pH: 5 (ref 5.0–8.0)

## 2019-07-25 LAB — GLUCOSE, CAPILLARY
Glucose-Capillary: 135 mg/dL — ABNORMAL HIGH (ref 70–99)
Glucose-Capillary: 160 mg/dL — ABNORMAL HIGH (ref 70–99)

## 2019-07-25 LAB — RETICULOCYTES
Immature Retic Fract: 27.4 % — ABNORMAL HIGH (ref 2.3–15.9)
RBC.: 2.95 MIL/uL — ABNORMAL LOW (ref 3.87–5.11)
Retic Count, Absolute: 63.7 10*3/uL (ref 19.0–186.0)
Retic Ct Pct: 2.2 % (ref 0.4–3.1)

## 2019-07-25 LAB — IRON AND TIBC
Iron: 10 ug/dL — ABNORMAL LOW (ref 28–170)
Saturation Ratios: 2 % — ABNORMAL LOW (ref 10.4–31.8)
TIBC: 648 ug/dL — ABNORMAL HIGH (ref 250–450)
UIBC: 638 ug/dL

## 2019-07-25 LAB — POC OCCULT BLOOD, ED: Fecal Occult Bld: NEGATIVE

## 2019-07-25 LAB — VITAMIN B12: Vitamin B-12: 726 pg/mL (ref 180–914)

## 2019-07-25 LAB — PREPARE RBC (CROSSMATCH)

## 2019-07-25 LAB — ETHANOL: Alcohol, Ethyl (B): <10 mg/dL (ref <10)

## 2019-07-25 LAB — FOLATE: Folate: 66.7 ng/mL (ref >5.9)

## 2019-07-25 LAB — AFP TUMOR MARKER: AFP-Tumor Marker: 3.2 ng/mL

## 2019-07-25 LAB — PHOSPHORUS: Phosphorus: 3.9 mg/dL (ref 2.5–4.6)

## 2019-07-25 LAB — CBG MONITORING, ED: Glucose-Capillary: 67 mg/dL — ABNORMAL LOW (ref 70–99)

## 2019-07-25 LAB — AMMONIA: Ammonia: 17 umol/L (ref 9–35)

## 2019-07-25 LAB — FERRITIN: Ferritin: 5 ng/mL — ABNORMAL LOW (ref 11–307)

## 2019-07-25 LAB — PROTIME-INR
INR: 1.3 — ABNORMAL HIGH (ref 0.8–1.2)
Prothrombin Time: 15.6 seconds — ABNORMAL HIGH (ref 11.4–15.2)

## 2019-07-25 LAB — MAGNESIUM: Magnesium: 1.4 mg/dL — ABNORMAL LOW (ref 1.7–2.4)

## 2019-07-25 LAB — ABO/RH: ABO/RH(D): O POS

## 2019-07-25 MED ORDER — HYDROCODONE-ACETAMINOPHEN 5-325 MG PO TABS: 1.0000 | ORAL_TABLET | ORAL | Status: DC | PRN

## 2019-07-25 MED ORDER — SODIUM CHLORIDE 0.9% FLUSH
3.0000 mL | Freq: Two times a day (BID) | INTRAVENOUS | Status: DC
  Administered 2019-07-25 – 2019-07-26 (×3): 3 mL via INTRAVENOUS

## 2019-07-25 MED ORDER — ONDANSETRON HCL 4 MG PO TABS: 4.0000 mg | ORAL_TABLET | Freq: Four times a day (QID) | ORAL | Status: DC | PRN

## 2019-07-25 MED ORDER — DEXTROSE 50 % IV SOLN: 12.5000 g | INTRAVENOUS | Status: AC

## 2019-07-25 MED ORDER — GUAIFENESIN ER 600 MG PO TB12
600.0000 mg | ORAL_TABLET | Freq: Two times a day (BID) | ORAL | Status: DC
  Administered 2019-07-25 – 2019-07-27 (×4): 600 mg via ORAL
  Filled 2019-07-25 (×4): qty 1

## 2019-07-25 MED ORDER — LACTULOSE 10 GM/15ML PO SOLN
20.0000 g | Freq: Every day | ORAL | Status: DC
  Administered 2019-07-26 – 2019-07-27 (×2): 20 g via ORAL
  Filled 2019-07-25 (×2): qty 30

## 2019-07-25 MED ORDER — ACETAMINOPHEN 650 MG RE SUPP: 650.0000 mg | Freq: Four times a day (QID) | RECTAL | Status: DC | PRN

## 2019-07-25 MED ORDER — NICOTINE 21 MG/24HR TD PT24
21.0000 mg | MEDICATED_PATCH | Freq: Every day | TRANSDERMAL | Status: DC
  Administered 2019-07-26 – 2019-07-27 (×2): 21 mg via TRANSDERMAL
  Filled 2019-07-25 (×2): qty 1

## 2019-07-25 MED ORDER — SODIUM CHLORIDE 0.9 % IV SOLN
10.0000 mL/h | Freq: Once | INTRAVENOUS | Status: AC
  Administered 2019-07-25: 10 mL/h via INTRAVENOUS

## 2019-07-25 MED ORDER — SODIUM CHLORIDE 0.9% IV SOLUTION: Freq: Once | INTRAVENOUS | Status: DC

## 2019-07-25 MED ORDER — PANTOPRAZOLE SODIUM 40 MG IV SOLR
40.0000 mg | Freq: Two times a day (BID) | INTRAVENOUS | Status: DC
  Administered 2019-07-25 – 2019-07-27 (×4): 40 mg via INTRAVENOUS
  Filled 2019-07-25 (×4): qty 40

## 2019-07-25 MED ORDER — TRAZODONE HCL 50 MG PO TABS
25.0000 mg | ORAL_TABLET | Freq: Every evening | ORAL | Status: AC | PRN
  Administered 2019-07-26: 25 mg via ORAL
  Filled 2019-07-25: qty 1

## 2019-07-25 MED ORDER — ALBUTEROL SULFATE HFA 108 (90 BASE) MCG/ACT IN AERS: 2.0000 | INHALATION_SPRAY | RESPIRATORY_TRACT | Status: DC | PRN

## 2019-07-25 MED ORDER — MAGNESIUM SULFATE IN D5W 1-5 GM/100ML-% IV SOLN
1.0000 g | Freq: Once | INTRAVENOUS | Status: AC
  Administered 2019-07-25: 1 g via INTRAVENOUS
  Filled 2019-07-25: qty 100

## 2019-07-25 MED ORDER — INSULIN ASPART 100 UNIT/ML ~~LOC~~ SOLN
0.0000 [IU] | SUBCUTANEOUS | Status: DC
  Administered 2019-07-26: 2 [IU] via SUBCUTANEOUS
  Administered 2019-07-26: 3 [IU] via SUBCUTANEOUS
  Administered 2019-07-27: 2 [IU] via SUBCUTANEOUS
  Filled 2019-07-25: qty 0.09

## 2019-07-25 MED ORDER — DOCUSATE SODIUM 100 MG PO CAPS
100.0000 mg | ORAL_CAPSULE | Freq: Two times a day (BID) | ORAL | Status: DC
  Administered 2019-07-25 – 2019-07-27 (×4): 100 mg via ORAL
  Filled 2019-07-25 (×4): qty 1

## 2019-07-25 MED ORDER — ALBUTEROL SULFATE (2.5 MG/3ML) 0.083% IN NEBU: 2.5000 mg | INHALATION_SOLUTION | RESPIRATORY_TRACT | Status: DC | PRN

## 2019-07-25 MED ORDER — IPRATROPIUM-ALBUTEROL 0.5-2.5 (3) MG/3ML IN SOLN: 3.0000 mL | Freq: Four times a day (QID) | RESPIRATORY_TRACT | Status: DC

## 2019-07-25 MED ORDER — ACETAMINOPHEN 325 MG PO TABS: 650.0000 mg | ORAL_TABLET | Freq: Four times a day (QID) | ORAL | Status: DC | PRN

## 2019-07-25 MED ORDER — ROSUVASTATIN CALCIUM 5 MG PO TABS
5.0000 mg | ORAL_TABLET | Freq: Every day | ORAL | Status: DC
  Administered 2019-07-26: 5 mg via ORAL
  Filled 2019-07-25: qty 1

## 2019-07-25 MED ORDER — RIFAXIMIN 550 MG PO TABS
550.0000 mg | ORAL_TABLET | Freq: Two times a day (BID) | ORAL | Status: DC
  Administered 2019-07-25 – 2019-07-27 (×4): 550 mg via ORAL
  Filled 2019-07-25 (×4): qty 1

## 2019-07-25 MED ORDER — THIAMINE HCL 100 MG/ML IJ SOLN
100.0000 mg | Freq: Once | INTRAMUSCULAR | Status: AC
  Administered 2019-07-25: 100 mg via INTRAVENOUS
  Filled 2019-07-25: qty 2

## 2019-07-25 MED ORDER — SPIRONOLACTONE 25 MG PO TABS
25.0000 mg | ORAL_TABLET | Freq: Every day | ORAL | Status: DC
  Administered 2019-07-26 – 2019-07-27 (×2): 25 mg via ORAL
  Filled 2019-07-25 (×2): qty 1

## 2019-07-25 MED ORDER — ONDANSETRON HCL 4 MG/2ML IJ SOLN: 4.0000 mg | Freq: Four times a day (QID) | INTRAMUSCULAR | Status: DC | PRN

## 2019-07-25 NOTE — ED Provider Notes (Signed)
[facility] DEPT Provider Note   CSN: [id number] Arrival date & time: 07/25/19  1203     History Chief Complaint  Patient presents with  . Abnormal Lab    Christine Barton is a 69 y.o. female with past medical history of breast cancer, alcohol use disorder, chemotherapy and radiation, type II DM, HTN, HLD, and most notably cirrhosis with esophageal varices who presents to the ED per request of her gastroenterologist regarding an abnormal lab.  I reviewed patient's medical record and she was evaluated by Dr. [name] with [facility] yesterday for her regular outpatient follow-up appointment for her MELD score 8 cirrhosis.  EGD performed 02/23/2019 demonstrated grade 1 esophageal varices and last colonoscopy on 11/30/2016 demonstrated diverticulosis, sessile polyps, and internal hemorrhoids with repeat colonoscopy scheduled for this year.  On my examination, patient endorses some fatigue that she simply associated with her liver disease.  She also reports lightheadedness upon standing and feeling cold, however she cites that she is cold-natured at baseline.  She denies any recent illness, fevers or chills, chest pain or difficulty breathing, urinary symptoms, weakness or numbness, abdominal pain, nausea, vomiting, hematochezia, or melena.  Patient informs me that she has not been eating well for the past 6 to 12 months and has lost 20 pounds over that span of time.  HPI     Past Medical History:  Diagnosis Date  . Alcohol abuse, unspecified   . Breast cancer ([place]) 1998   Right  . Cataract    left eye  . Cervical spondylosis 2006   MRI  . Degeneration of cervical intervertebral disc 2006   MRI  . Diabetes mellitus without complication ([place])   . Diverticulosis of colon (without mention of hemorrhage)   . Hyperpotassemia   . Microscopic hematuria   . Mononeuritis of unspecified site   . Nonspecific abnormal results of liver function study   .  Other abnormal glucose   . Other and unspecified hyperlipidemia    no per pt  . Other chronic nonalcoholic liver disease   . Personal history of chemotherapy   . Personal history of malignant neoplasm of breast   . Personal history of radiation therapy   . Pneumothorax, acute    right, spontaneous  . Tobacco use disorder   . Unspecified vitamin D deficiency     Patient Active Problem List   Diagnosis Date Noted  . Mass of soft tissue of face 03/06/2019  . Itching in the vaginal area 02/23/2019  . HSV infection 02/23/2019  . Aortic atherosclerosis ([place]) 01/22/2019  . Coronary atherosclerosis 01/22/2019  . Encounter for screening for lung cancer 01/04/2019  . Low back pain 12/07/2018  . Venous insufficiency 11/15/2018  . Hyponatremia 11/07/2018  . Elevated serum creatinine 11/07/2018  . Melena   . Esophageal varices without bleeding ([place]) 10/27/2018  . Anemia 10/23/2018  . Fatigue 10/23/2018  . Poor balance 05/30/2018  . Falls 05/30/2018  . Generalized weakness 05/30/2018  . Cirrhosis of liver ([place]) 04/24/2018  . Ascites 04/24/2018  . Gallstones 04/24/2018  . Heme positive stool 04/24/2018  . Screening mammogram, encounter for 11/07/2017  . Smoker 11/07/2017  . Screening examination for STD (sexually transmitted disease) 11/11/2016  . Welcome to Medicare preventive visit 10/26/2016  . Estrogen deficiency 10/26/2016  . Colon cancer screening 11/01/2014  . Elevated liver enzymes 11/01/2014  . Encounter for routine gynecological examination 09/12/2013  . Rapid heart beat 10/10/2012  . Heartburn 08/20/2011  . Routine general medical  examination at a health care facility 07/21/2011  . Vitamin D deficiency 08/04/2009  . POSTMENOPAUSAL STATUS 08/04/2009  . Controlled diabetes mellitus type 2 with complications ([place]) 70/62/3762  . Hyperlipidemia associated with type 2 diabetes mellitus ([place]) 06/14/2008  . [place] DISEASE, CERVICAL 03/30/2007  . History of alcohol abuse  12/06/2006  . Neuropathy of both feet 12/06/2006  . DIVERTICULOSIS, COLON 12/06/2006  . Fatty liver 12/06/2006  . BREAST CANCER, HX OF 12/06/2006    Past Surgical History:  Procedure Laterality Date  . BREAST BIOPSY  9/03   Right  . BREAST LUMPECTOMY Right 1998  . BREAST LUMPECTOMY  1998  . CHEST TUBE INSERTION  11/02/2014  . COLONOSCOPY    . ESOPHAGOGASTRODUODENOSCOPY (EGD) WITH PROPOFOL N/A 10/30/2018   Procedure: ESOPHAGOGASTRODUODENOSCOPY (EGD) WITH PROPOFOL;  Surgeon: Mauri Pole, MD;  Location: [facility] END[facility]COPY;  Service: Endoscopy;  Laterality: N/A;  . ESOPHAGOGASTRODUODENOSCOPY (EGD) WITH PROPOFOL N/A 03/12/2019   Procedure: ESOPHAGOGASTRODUODENOSCOPY (EGD) WITH PROPOFOL;  Surgeon: Mauri Pole, MD;  Location: [facility] ENDOSCOPY;  Service: Endoscopy;  Laterality: N/A;  . EYE SURGERY  02/2017   cataract extraction with lens implant-left  . MOUTH SURGERY    . TUBAL LIGATION       OB History   No obstetric history on file.     Family History  Problem Relation Age of Onset  . Heart failure Father   . Heart attack Father   . Colon cancer Maternal Uncle   . Stroke Mother   . Esophageal cancer Neg Hx   . Rectal cancer Neg Hx   . Stomach cancer Neg Hx     Social History   Tobacco Use  . Smoking status: Current Every Day Smoker    Packs/day: 1.00    Years: 53.00    Pack years: 53.00    Types: Cigarettes  . Smokeless tobacco: Never Used  . Tobacco comment: tobacco info given 04/27/2018  Substance Use Topics  . Alcohol use: Not Currently  . Drug use: No    Home Medications Prior to Admission medications   Medication Sig Start Date End Date Taking? Authorizing Provider  albuterol (VENTOLIN HFA) 108 (90 Base) MCG/ACT inhaler Inhale 2 puffs into the lungs every 4 (four) hours as needed for wheezing or shortness of breath. 11/20/18  Yes Tower, [name], MD  furosemide (LASIX) 20 MG tablet Take daily Patient taking differently: Take 20 mg by mouth daily.  05/04/19   Yes Nandigam, Venia Minks, MD  lactulose (CHRONULAC) 10 GM/15ML solution Take 15 mLs (10 g total) by mouth 3 (three) times daily. Patient taking differently: Take 20 g by mouth See admin instructions. 2 tablespoons daily 06/28/18  Yes Nandigam, Venia Minks, MD  metFORMIN (GLUCOPHAGE XR) 500 MG 24 hr tablet Take 1 tablet (500 mg total) by mouth daily with breakfast. 02/07/19  Yes Tower, [name], MD  Multiple Vitamin (MULTIVITAMIN) tablet Take 1 tablet by mouth daily.   Yes [provider]  nadolol (CORGARD) 20 MG tablet Take 1 tablet (20 mg total) by mouth daily. 10/30/18 10/30/19 Yes Nandigam, Venia Minks, MD  pantoprazole (PROTONIX) 40 MG tablet TAKE 1 TABLET (40 MG TOTAL) BY MOUTH 2 (TWO) TIMES DAILY BEFORE A MEAL. 04/30/19  Yes Nandigam, Venia Minks, MD  Polyethyl Glycol-Propyl Glycol (SYSTANE OP) Place 1 drop into the left eye daily as needed (dryness).   Yes [provider]  rosuvastatin (CRESTOR) 5 MG tablet Take 1 tablet (5 mg total) by mouth daily at 6 PM. 02/15/19  07/06/23 Yes Donato Heinz, MD  spironolactone (ALDACTONE) 25 MG tablet Take 1 tablet (25 mg total) by mouth daily. 05/09/19  Yes Nandigam, Venia Minks, MD  traZODone (DESYREL) 50 MG tablet Take 0.5-1 tablets (25-50 mg total) by mouth at bedtime as needed for sleep. 07/06/19  Yes Tower, [name], MD  XIFAXAN 550 MG TABS tablet Take 1 tablet (550 mg total) by mouth 2 (two) times daily. 02/20/19  Yes Nandigam, Venia Minks, MD  ALPRAZolam (XANAX) 1 MG tablet Take 2.5 mg by mouth once. Borrowed one from friend only took one 4.14.21    [provider]    Allergies    Oxycodone and Glipizide  Review of Systems   Review of Systems  All other systems reviewed and are negative.   Physical Exam Updated Vital Signs BP 132/65 (BP Location: Left Arm)   Pulse 79   Temp 98.5 F (36.9 C) (Oral)   Resp 17   Ht 5' 4.5" (1.638 m)   Wt 47.2 kg   SpO2 100%   BMI 17.58 kg/m   Physical Exam Vitals and nursing note  reviewed. Exam conducted with a chaperone present.  Constitutional:      Comments: Fatigued.  HENT:     Head: Normocephalic and atraumatic.     Nose: Nose normal.     Mouth/Throat:     Pharynx: Oropharynx is clear.  Eyes:     Comments: Pallor when everting lower lid.  Cardiovascular:     Rate and Rhythm: Normal rate and regular rhythm.     Pulses: Normal pulses.     Heart sounds: Normal heart sounds.  Pulmonary:     Effort: Pulmonary effort is normal. No respiratory distress.     Breath sounds: Normal breath sounds.  Abdominal:     Comments: Soft, nondistended.  Very mild tenderness diffusely, but no significant focal TTP.  No guarding.  No overlying skin changes.  NABS.  Musculoskeletal:        General: Normal range of motion.     Cervical back: Normal range of motion. No rigidity.     Right lower leg: No edema.     Left lower leg: No edema.  Skin:    General: Skin is dry.     Capillary Refill: Capillary refill takes less than 2 seconds.  Neurological:     Mental Status: She is alert and oriented to person, place, and time.     GCS: GCS eye subscore is 4. GCS verbal subscore is 5. GCS motor subscore is 6.  Psychiatric:        Mood and Affect: Mood normal.        Behavior: Behavior normal.        Thought Content: Thought content normal.     ED Results / Procedures / Treatments   Labs (all labs ordered are listed, but only abnormal results are displayed) Labs Reviewed  COMPREHENSIVE METABOLIC PANEL - Abnormal; Notable for the following components:      Result Value   Glucose, Bld 117 (*)    BUN 27 (*)    All other components within normal limits  CBC - Abnormal; Notable for the following components:   RBC 3.30 (*)    Hemoglobin 6.5 (*)    HCT 23.2 (*)    MCV 70.3 (*)    MCH 19.7 (*)    MCHC 28.0 (*)    RDW 17.2 (*)    All other components within normal limits  FERRITIN - Abnormal;  Notable for the following components:   Ferritin 5 (*)    All other components  within normal limits  IRON AND TIBC - Abnormal; Notable for the following components:   Iron 10 (*)    TIBC 648 (*)    Saturation Ratios 2 (*)    All other components within normal limits  RETICULOCYTES - Abnormal; Notable for the following components:   RBC. 2.95 (*)    Immature Retic Fract 27.4 (*)    All other components within normal limits  SARS CORONAVIRUS 2 (TAT 6-24 HRS)  AMMONIA  VITAMIN B12  FOLATE  MAGNESIUM  PHOSPHORUS  POC OCCULT BLOOD, ED  TYPE AND SCREEN  ABO/RH  PREPARE RBC (CROSSMATCH)    EKG None  Radiology No results found.  Procedures .Critical Care Performed by: Corena Herter, PA-C Authorized by: Corena Herter, PA-C   Critical care provider statement:    Critical care time (minutes):  45   Critical care was necessary to treat or prevent imminent or life-threatening deterioration of the following conditions:  Circulatory failure   Critical care was time spent personally by me on the following activities:  Discussions with consultants, evaluation of patient's response to treatment, examination of patient, ordering and performing treatments and interventions, ordering and review of laboratory studies, ordering and review of radiographic studies, pulse oximetry, re-evaluation of patient's condition, obtaining history from patient or surrogate, review of old charts, blood draw for specimens and development of treatment plan with patient or surrogate   I assumed direction of critical care for this patient from another provider in my specialty: no     (including critical care time)  Medications Ordered in ED Medications  0.9 %  sodium chloride infusion (has no administration in time range)  thiamine (B-1) injection 100 mg (has no administration in time range)    ED Course  I have reviewed the triage vital signs and the nursing notes.  Pertinent labs & imaging results that were available during my care of the patient were reviewed by me and  considered in my medical decision making (see chart for details).  Clinical Course as of Jul 25 1831  Wed Jul 25, 2019  1804 Spoke with Dr. Fuller Plan with [facility] who will evaluate patient once she is admitted to the hospital for her symptomatic anemia.    [GG]    Clinical Course User Index [GG] Corena Herter, PA-C   MDM Rules/Calculators/A&P                      Patient's history, physical exam, and laboratory work-up is consistent with acute symptomatic anemia.  I reviewed patient's medical record and her hemoglobin was normal at 12.0 and labs obtained 5 months ago.  Today her labs demonstrate a microcytic anemia with hemoglobin of 6.5.  She is endorsing fatigue, lightheadedness upon standing, as well as feeling cold.  She denies any chest pain or difficulty breathing.  Low suspicion for cardiac demand ischemia and do not feel as though cardiac work-up warranted emergently here in the ED. Patient has been denying melena or hematochezia.  Fecal occult study was negative here in the ED.  No gross melena on physical exam.  Iron panel consistent with iron deficiency anemia.  Plan is to consult with [facility] gastroenterology before ultimately admitting to hospitalist services for her symptomatic anemia.      Spoke with Dr. Fuller Plan with  GI who will evaluate patient once she is admitted to the  hospital.  Spoke with hospitalist who asked to add thiamine IV and obtain magnesium and phosphorus labs, but will see and admit patient.  COVID-19 test pending.     Final Clinical Impression(s) / ED Diagnoses Final diagnoses:  Symptomatic anemia    Rx / DC Orders ED Discharge Orders    None       Corena Herter, PA-C 07/25/19 1840    Lucrezia Starch, MD 07/25/19 2358

## 2019-07-25 NOTE — Telephone Encounter (Signed)
FYI team about a patient.  She is not in the hospital currently but will be at some point. Thanks. GM

## 2019-07-25 NOTE — ED Notes (Signed)
Hold labs due to blood transfusing. Pt is refusing to be stuck again for additional blood work.

## 2019-07-25 NOTE — H&P (Signed)
Christine Barton:[id number] DOB: 1950/09/12 DOA: 07/25/2019     PCP: [name], MD   Outpatient Specialists:     GI  Dr. Oletta Cohn ( [facility])  [facility] hepatologist  Patient arrived to ER on 07/25/19 at 1203  Patient coming from: home Lives alone,      Chief Complaint:   Chief Complaint  Patient presents with  . Abnormal Lab    HPI: Christine Barton is a 69 y.o. female with medical history significant of  hx of cirrhosis due to EtOH and NASH, history of alcohol abuse, history of breast cancer, DM2, HLD    Presented with abnormal lab results from GI office seen earlier today by Dr. [name] and routine labs notable for significant microcytic anemia, with H/H 6.5/21.3, MCV 64.9 (was 12/35.7 in 02/2019 with MCV 89). Dr. [name] had left message for patient with instructions to go to [facility] ER for eval and transfusion. Pt denies any melena no blood in stool, reports decreased po intake and weight loss over 20 lb for the past few months. She reports feeling fatigued and lightheaded No sick contacts, no fever.  Denies EtOH not the past 1 year Smokes 1 pack a day   Infectious risk factors:  Reports  severe fatigue    Has   been vaccinated against COVID x2 06/16/2019  in house  PCR testing  Pending  Lab Results  Component Value Date   [place] Not Detected 05/01/2019   [place] NEGATIVE 03/09/2019   [place] NEGATIVE 10/27/2018      Regarding pertinent Chronic problems:    Cirrhosis on Xifaxan and lactulose, 20 mg of Lasix 25 mg of spironolactone and 20 mg of nadolol daily Known history of grade 1 esophageal varices on endoscopy November 2020 Last colonoscopy was August 2018 showed diverticulosis and sessile polyps internal hemorrhoids   Hyperlipidemia - on statins Crestor      DM 2 -  Lab Results  Component Value Date   HGBA1C 6.7 (A) 02/07/2019   on  PO meds only, diet controlled      Asthma -well   controlled on home inhalers      Hx of [place] in  1998 - sp chemo radiation therapy and masectomy   While in ER: Noted to be anemic down to 6.5 Anemia panel ordered showed evidence of iron deficiency Type and screen for blood transfusion Transfused 1 unit Initially hypoglycemic down to 65 improved after drinking apple juice  ER Provider Called: [facility]   Dr. Fuller Plan They Recommend admit to medicine  Will see in AM   Hospitalist was called for admission for symptomatic anemia  The following Work up has been ordered so far:  Orders Placed This Encounter  Procedures  . Critical Care  . Critical Care  . SARS CORONAVIRUS 2 (TAT 6-24 HRS) Nasopharyngeal Nasopharyngeal Swab  . Comprehensive metabolic panel  . CBC  . Ferritin (Iron Binding Protein)  . Ammonia  . Iron and TIBC  . Folate  . Vitamin B12  . Reticulocytes  . Magnesium  . Phosphorus  . Ethanol  . Diet NPO time specified  . Orthostatic Vital signs  . Place X2 Large Bore IV's  . Initiate Carrier Fluid Protocol  . Informed Consent Details: Physician/Practitioner Attestation; Transcribe to consent form and obtain patient signature  . Consult to gastroenterology  ALL PATIENTS BEING ADMITTED/HAVING PROCEDURES NEED COVID-19 SCREENING  . Consult to hospitalist  ALL PATIENTS BEING ADMITTED/HAVING PROCEDURES NEED COVID-19 SCREENING  .  Pulse oximetry, continuous  . POC occult blood, ED  . Type and screen [place]  . ABO/Rh  . Prepare RBC (crossmatch)    Following Medications were ordered in ER: Medications  0.9 %  sodium chloride infusion (has no administration in time range)  thiamine (B-1) injection 100 mg (has no administration in time range)        Consult Orders  (From admission, onward)         Start     Ordered   07/25/19 1824  Consult to hospitalist  ALL PATIENTS BEING ADMITTED/HAVING PROCEDURES NEED COVID-19 SCREENING  Once    Comments: ALL PATIENTS BEING ADMITTED/HAVING PROCEDURES NEED COVID-19 SCREENING  Provider:  (Not yet assigned)   Question Answer Comment  Place call to: [facility]   Reason for Consult Admit      07/25/19 1823          Significant initial  Findings: Abnormal Labs Reviewed  COMPREHENSIVE METABOLIC PANEL - Abnormal; Notable for the following components:      Result Value   Glucose, Bld 117 (*)    BUN 27 (*)    All other components within normal limits  CBC - Abnormal; Notable for the following components:   RBC 3.30 (*)    Hemoglobin 6.5 (*)    HCT 23.2 (*)    MCV 70.3 (*)    MCH 19.7 (*)    MCHC 28.0 (*)    RDW 17.2 (*)    All other components within normal limits  FERRITIN - Abnormal; Notable for the following components:   Ferritin 5 (*)    All other components within normal limits  IRON AND TIBC - Abnormal; Notable for the following components:   Iron 10 (*)    TIBC 648 (*)    Saturation Ratios 2 (*)    All other components within normal limits  RETICULOCYTES - Abnormal; Notable for the following components:   RBC. 2.95 (*)    Immature Retic Fract 27.4 (*)    All other components within normal limits   Otherwise labs showing:    Recent Labs  Lab 07/24/19 1422 07/25/19 1450  NA 135 139  K 4.3 4.0  CO2 25 25  GLUCOSE 168* 117*  BUN 25* 27*  CREATININE 0.73 0.75  CALCIUM 9.8 9.6    Cr  stable,   Lab Results  Component Value Date   CREATININE 0.75 07/25/2019   CREATININE 0.73 07/24/2019   CREATININE 0.78 06/14/2019    Recent Labs  Lab 07/24/19 1422 07/25/19 1450  AST 18 25  ALT 8 12  ALKPHOS 116 93  BILITOT 0.5 0.7  PROT 7.3 7.6  ALBUMIN 4.2 4.0   Lab Results  Component Value Date   CALCIUM 9.6 07/25/2019     WBC      Component Value Date/Time   WBC 9.0 07/25/2019 1450   ANC    Component Value Date/Time   NEUTROABS 5.2 07/24/2019 1422   ALC No components found for: LYMPHAB    Plt: Lab Results  Component Value Date   PLT 242 07/25/2019      COVID-19 Labs  Recent Labs    07/25/19 1640  FERRITIN 5*    Lab Results   Component Value Date   SARSCOV2NAA Not Detected 05/01/2019   SARSCOV2NAA NEGATIVE 03/09/2019   [place] NEGATIVE 10/27/2018    HG/HCT   Down  from baseline see below    Component Value Date/Time   HGB 6.5 (LL)  07/25/2019 1450   HCT 23.2 (L) 07/25/2019 1450    No results for input(s): LIPASE, AMYLASE in the last 168 hours. Recent Labs  Lab 07/25/19 1640  AMMONIA 17    No components found for: LABALBU    ECG: Ordered     DM  labs:  HbA1C: Recent Labs    10/23/18 1005 11/06/18 0839 02/07/19 0909  HGBA1C 7.0* 7.0* 6.7*     CBG (last 3)  Recent Labs    07/25/19 1932 07/25/19 2244  GLUCAP 67* 135*       UA  Ordered     Ordered       ED Triage Vitals [07/25/19 1240]  Enc Vitals Group     BP 120/63     Pulse Rate 83     Resp 16     Temp 98.3 F (36.8 C)     Temp Source Oral     SpO2 100 %     Weight 104 lb (47.2 kg)     Height 5' 4.5" (1.638 m)     Head Circumference      Peak Flow      Pain Score 0     Pain Loc      Pain Edu?      Excl. in [place]?   DEYC(14)@       Latest  Blood pressure 132/65, pulse 79, temperature 98.5 F (36.9 C), temperature source Oral, resp. rate 17, height 5' 4.5" (1.638 m), weight 47.2 kg, SpO2 100 %.       Review of Systems:    Pertinent positives include:  fatigue, weight loss  shortness of breath at rest. dyspnea on exertion,  Constitutional:  No weight loss, night sweats, Fevers, chills HEENT:  No headaches, Difficulty swallowing,Tooth/dental problems,Sore throat,  No sneezing, itching, ear ache, nasal congestion, post nasal drip,  Cardio-vascular:  No chest pain, Orthopnea, PND, anasarca, dizziness, palpitations.no Bilateral lower extremity swelling  GI:  No heartburn, indigestion, abdominal pain, nausea, vomiting, diarrhea, change in bowel habits, loss of appetite, melena, blood in stool, hematemesis Resp:  no  No No excess mucus, no productive cough, No non-productive cough, No coughing up of blood.No change  in color of mucus.No wheezing. Skin:  no rash or lesions. No jaundice GU:  no dysuria, change in color of urine, no urgency or frequency. No straining to urinate.  No flank pain.  Musculoskeletal:  No joint pain or no joint swelling. No decreased range of motion. No back pain.  Psych:  No change in mood or affect. No depression or anxiety. No memory loss.  Neuro: no localizing neurological complaints, no tingling, no weakness, no double vision, no gait abnormality, no slurred speech, no confusion  All systems reviewed and apart from [place] all are negative  Past Medical History:   Past Medical History:  Diagnosis Date  . Alcohol abuse, unspecified   . Breast cancer ([place]) 1998   Right  . Cataract    left eye  . Cervical spondylosis 2006   MRI  . Degeneration of cervical intervertebral disc 2006   MRI  . Diabetes mellitus without complication ([place])   . Diverticulosis of colon (without mention of hemorrhage)   . Hyperpotassemia   . Microscopic hematuria   . Mononeuritis of unspecified site   . Nonspecific abnormal results of liver function study   . Other abnormal glucose   . Other and unspecified hyperlipidemia    no per pt  . Other chronic nonalcoholic liver disease   .  Personal history of chemotherapy   . Personal history of malignant neoplasm of breast   . Personal history of radiation therapy   . Pneumothorax, acute    right, spontaneous  . Tobacco use disorder   . Unspecified vitamin D deficiency      Past Surgical History:  Procedure Laterality Date  . BREAST BIOPSY  9/03   Right  . BREAST LUMPECTOMY Right 1998  . BREAST LUMPECTOMY  1998  . CHEST TUBE INSERTION  11/02/2014  . COLONOSCOPY    . ESOPHAGOGASTRODUODENOSCOPY (EGD) WITH PROPOFOL N/A 10/30/2018   Procedure: ESOPHAGOGASTRODUODENOSCOPY (EGD) WITH PROPOFOL;  Surgeon: Mauri Pole, MD;  Location: [facility];  Service: Endoscopy;  Laterality: N/A;  . ESOPHAGOGASTRODUODENOSCOPY (EGD) WITH PROPOFOL  N/A 03/12/2019   Procedure: ESOPHAGOGASTRODUODENOSCOPY (EGD) WITH PROPOFOL;  Surgeon: Mauri Pole, MD;  Location: [facility];  Service: Endoscopy;  Laterality: N/A;  . EYE SURGERY  02/2017   cataract extraction with lens implant-left  . MOUTH SURGERY    . TUBAL LIGATION      Social History:  Ambulatory   Independently      reports that she has been smoking cigarettes. She has a 53.00 pack-year smoking history. She has never used smokeless tobacco. She reports previous alcohol use. She reports that she does not use drugs.   Family History:   Family History  Problem Relation Age of Onset  . Heart failure Father   . Heart attack Father   . Colon cancer Maternal Uncle   . Stroke Mother   . Esophageal cancer Neg Hx   . Rectal cancer Neg Hx   . Stomach cancer Neg Hx     Allergies: Allergies  Allergen Reactions  . Oxycodone Nausea And Vomiting  . Glipizide Other (See Comments)    Stomach pain     Prior to Admission medications   Medication Sig Start Date End Date Taking? Authorizing Provider  albuterol (VENTOLIN HFA) 108 (90 Base) MCG/ACT inhaler Inhale 2 puffs into the lungs every 4 (four) hours as needed for wheezing or shortness of breath. 11/20/18  Yes Tower, [name], MD  furosemide (LASIX) 20 MG tablet Take daily Patient taking differently: Take 20 mg by mouth daily.  05/04/19  Yes Nandigam, Venia Minks, MD  lactulose (CHRONULAC) 10 GM/15ML solution Take 15 mLs (10 g total) by mouth 3 (three) times daily. Patient taking differently: Take 20 g by mouth See admin instructions. 2 tablespoons daily 06/28/18  Yes Nandigam, Venia Minks, MD  metFORMIN (GLUCOPHAGE XR) 500 MG 24 hr tablet Take 1 tablet (500 mg total) by mouth daily with breakfast. 02/07/19  Yes Tower, [name], MD  Multiple Vitamin (MULTIVITAMIN) tablet Take 1 tablet by mouth daily.   Yes [provider]  nadolol (CORGARD) 20 MG tablet Take 1 tablet (20 mg total) by mouth daily. 10/30/18 10/30/19 Yes  Nandigam, Venia Minks, MD  pantoprazole (PROTONIX) 40 MG tablet TAKE 1 TABLET (40 MG TOTAL) BY MOUTH 2 (TWO) TIMES DAILY BEFORE A MEAL. 04/30/19  Yes Nandigam, Venia Minks, MD  Polyethyl Glycol-Propyl Glycol (SYSTANE OP) Place 1 drop into the left eye daily as needed (dryness).   Yes [provider]  rosuvastatin (CRESTOR) 5 MG tablet Take 1 tablet (5 mg total) by mouth daily at 6 PM. 02/15/19 07/06/23 Yes Donato Heinz, MD  spironolactone (ALDACTONE) 25 MG tablet Take 1 tablet (25 mg total) by mouth daily. 05/09/19  Yes Nandigam, Venia Minks, MD  traZODone (DESYREL) 50 MG tablet Take 0.5-1 tablets (  25-50 mg total) by mouth at bedtime as needed for sleep. 07/06/19  Yes Tower, [name], MD  XIFAXAN 550 MG TABS tablet Take 1 tablet (550 mg total) by mouth 2 (two) times daily. 02/20/19  Yes Nandigam, Venia Minks, MD  ALPRAZolam (XANAX) 1 MG tablet Take 2.5 mg by mouth once. Borrowed one from friend only took one 4.14.21    [provider]   Physical Exam: Blood pressure 132/65, pulse 79, temperature 98.5 F (36.9 C), temperature source Oral, resp. rate 17, height 5' 4.5" (1.638 m), weight 47.2 kg, SpO2 100 %. 1. General:  in No  Acute distress   Chronically ill -appearing 2. Psychological: Alert and  Oriented 3. Head/ENT:    Dry Mucous Membranes                          Head Non traumatic, neck supple                             Poor Dentition 4. SKIN:  decreased Skin turgor,  Skin clean Dry and intact no rash 5. Heart: Regular rate and rhythm n  Murmur, no Rub or gallop 6. Lungs: distant  no wheezes or crackles   7. Abdomen: Soft,  non-tender,  8. Lower extremities: no clubbing, cyanosis, no   edema 9. Neurologically Grossly intact, moving all 4 extremities equally  Asterixis    All other LABS:     Recent Labs  Lab 07/24/19 1422 07/25/19 1450  WBC 8.3 9.0  NEUTROABS 5.2  --   HGB 6.5 Repeated and verified X2.* 6.5*  HCT 21.3 Repeated and verified X2.* 23.2*  MCV 64.9  Repeated and verified X2.* 70.3*  PLT 231.0 242     Recent Labs  Lab 07/24/19 1422 07/25/19 1450  NA 135 139  K 4.3 4.0  CL 103 105  CO2 25 25  GLUCOSE 168* 117*  BUN 25* 27*  CREATININE 0.73 0.75  CALCIUM 9.8 9.6     Recent Labs  Lab 07/24/19 1422 07/25/19 1450  AST 18 25  ALT 8 12  ALKPHOS 116 93  BILITOT 0.5 0.7  PROT 7.3 7.6  ALBUMIN 4.2 4.0       Cultures: No results found for: SDES, [place], [place], REPTSTATUS   Radiological Exams on Admission: No results found.  Chart has been reviewed    Assessment/Plan  69 y.o. female with medical history significant of  hx of cirrhosis due to EtOH and NASH, history of alcohol abuse, history of breast cancer, DM2, HLD  Admitted for symptomatic anemia  Present on Admission: . Symptomatic anemia transfuse 1 unit a repeat follow-up may need further transfusions.  Appreciate GI consult  . Hyperlipidemia associated with type 2 diabetes mellitus (HCC)-stable continue home medications  . History of alcohol abuse -currently in remission   . Cirrhosis of liver (HCC) - MELD-Na score: 8 at 07/25/2019  2:50 PM MELD score: 8 at 07/25/2019   Continue home medications including lactulose Xifaxan spironolactone.  Hold off on Lasix for tonight and monitor if patient is stable probably will need to resume tomorrow or after blood transfusion completed   . Malnutrition ([place]) -nutritional assessment check prealbumin level  Asthma/COPD -patient states she is chronically wheezing and coughing suspect likely COPD component.  Will make sure has as needed albuterol nebulizer as well as scheduled DuoNeb would likely benefit from long-term assessment encourage discontinuation tobacco abuse. Given  dyspnea will check chest x-ray appears to be nonacute area of fibrosis noted on prior CT at that time felt to be nonmalignant  . Tobacco abuse -  - Spoke about importance of quitting spent 5 minutes discussing options for treatment, prior attempts at  quitting, and dangers of smoking  -At this point patient is   interested in quitting  - order nicotine patch   - nursing tobacco cessation protocol  Dm2-  - Order Sensitive   SSI   -  check TSH and HgA1C  - Hold by mouth medications  Monitor for any evidence of hypoglycemia and treat as needed Frequent CBGs  . Esophageal varices without bleeding (HCC) -continue propranolol when able to tolerate appreciate GI input no history consistent with acute GI bleeding in the time  Other plan as per orders.  DVT prophylaxis:  SCD   Code Status:  FULL CODE   as per patient  I had personally discussed CODE STATUS with patient    Family Communication:   Family not at  Bedside    Disposition Plan:     To home once workup is complete and patient is stable   Following barriers for discharge:                            Electrolytes corrected                                                             Will need to be able to tolerate PO                                                        Will need consultants to evaluate patient prior to discharge                      Would benefit from PT/OT eval prior to DC  Ordered                                       Consults called:    [facility] aware will see tomorrow Admission status:  ED Disposition    ED Disposition Condition Comment   Admit  The patient appears reasonably stabilized for admission considering the current resources, flow, and capabilities available in the ED at this time, and I doubt any other [facility] requiring further screening and/or treatment in the ED prior to admission is  present.       Obs     Level of care   tele  For  24H        Precautions: admitted as  asymptomatic screening protocol    PPE: Used by the provider:   P100  eye Goggles,  Gloves      Yuleimy Kretz 07/25/2019, 10:48 PM    [facility]     after 2 AM please page floor coverage PA If 7AM-7PM, please contact the day team taking care of the  patient using [email or web address]   Patient was  evaluated in the context of the global COVID-19 pandemic, which necessitated consideration that the patient might be at risk for infection with the SARS-CoV-2 virus that causes COVID-19. Institutional protocols and algorithms that pertain to the evaluation of patients at risk for COVID-19 are in a state of rapid change based on information released by regulatory bodies including the CDC and federal and state organizations. These policies and algorithms were followed during the patient's care.

## 2019-07-25 NOTE — ED Triage Notes (Signed)
Patient was notified today that she needed to come to the ED for a blood transfusion. Hgb-6.5. patient denies any obvious active bleeding.

## 2019-07-25 NOTE — ED Notes (Signed)
Patient given apple juice. Will recheck CBG.

## 2019-07-25 NOTE — ED Notes (Signed)
Date and time results received: 07/25/19 3:31 PM   (use smartphrase ".now" to insert current time)  Test: Hgb Critical Value: 6.5  Name of Provider Notified: Dykstra  Orders Received? Or Actions Taken?: Orders Received - See Orders for details

## 2019-07-25 NOTE — Telephone Encounter (Signed)
Pt reported that she was advised by Dr. [name] to go to the ER at [facility] for blood transfusion.  She stated that she was told to ask Dr. [name] for direct admission so she can be seen sooner.

## 2019-07-26 DIAGNOSIS — I2510 Atherosclerotic heart disease of native coronary artery without angina pectoris: Secondary | ICD-10-CM | POA: Diagnosis present

## 2019-07-26 DIAGNOSIS — K7030 Alcoholic cirrhosis of liver without ascites: Secondary | ICD-10-CM | POA: Diagnosis present

## 2019-07-26 DIAGNOSIS — D649 Anemia, unspecified: Secondary | ICD-10-CM | POA: Diagnosis not present

## 2019-07-26 DIAGNOSIS — E785 Hyperlipidemia, unspecified: Secondary | ICD-10-CM | POA: Diagnosis present

## 2019-07-26 DIAGNOSIS — I8500 Esophageal varices without bleeding: Secondary | ICD-10-CM | POA: Diagnosis not present

## 2019-07-26 DIAGNOSIS — Z720 Tobacco use: Secondary | ICD-10-CM

## 2019-07-26 DIAGNOSIS — Z9221 Personal history of antineoplastic chemotherapy: Secondary | ICD-10-CM | POA: Diagnosis not present

## 2019-07-26 DIAGNOSIS — E43 Unspecified severe protein-calorie malnutrition: Secondary | ICD-10-CM | POA: Diagnosis present

## 2019-07-26 DIAGNOSIS — Z7984 Long term (current) use of oral hypoglycemic drugs: Secondary | ICD-10-CM | POA: Diagnosis not present

## 2019-07-26 DIAGNOSIS — K5790 Diverticulosis of intestine, part unspecified, without perforation or abscess without bleeding: Secondary | ICD-10-CM | POA: Diagnosis present

## 2019-07-26 DIAGNOSIS — F1011 Alcohol abuse, in remission: Secondary | ICD-10-CM

## 2019-07-26 DIAGNOSIS — I10 Essential (primary) hypertension: Secondary | ICD-10-CM | POA: Diagnosis present

## 2019-07-26 DIAGNOSIS — K5520 Angiodysplasia of colon without hemorrhage: Secondary | ICD-10-CM | POA: Diagnosis present

## 2019-07-26 DIAGNOSIS — Z20822 Contact with and (suspected) exposure to covid-19: Secondary | ICD-10-CM | POA: Diagnosis present

## 2019-07-26 DIAGNOSIS — Z853 Personal history of malignant neoplasm of breast: Secondary | ICD-10-CM | POA: Diagnosis not present

## 2019-07-26 DIAGNOSIS — Z823 Family history of stroke: Secondary | ICD-10-CM | POA: Diagnosis not present

## 2019-07-26 DIAGNOSIS — K766 Portal hypertension: Secondary | ICD-10-CM | POA: Diagnosis present

## 2019-07-26 DIAGNOSIS — K2970 Gastritis, unspecified, without bleeding: Secondary | ICD-10-CM | POA: Diagnosis not present

## 2019-07-26 DIAGNOSIS — K635 Polyp of colon: Secondary | ICD-10-CM | POA: Diagnosis not present

## 2019-07-26 DIAGNOSIS — D509 Iron deficiency anemia, unspecified: Secondary | ICD-10-CM | POA: Diagnosis present

## 2019-07-26 DIAGNOSIS — K7581 Nonalcoholic steatohepatitis (NASH): Secondary | ICD-10-CM

## 2019-07-26 DIAGNOSIS — I8510 Secondary esophageal varices without bleeding: Secondary | ICD-10-CM | POA: Diagnosis present

## 2019-07-26 DIAGNOSIS — E118 Type 2 diabetes mellitus with unspecified complications: Secondary | ICD-10-CM

## 2019-07-26 DIAGNOSIS — Z800 Family history of malignant neoplasm of digestive organs: Secondary | ICD-10-CM | POA: Diagnosis not present

## 2019-07-26 DIAGNOSIS — Z8249 Family history of ischemic heart disease and other diseases of the circulatory system: Secondary | ICD-10-CM | POA: Diagnosis not present

## 2019-07-26 DIAGNOSIS — I700 Atherosclerosis of aorta: Secondary | ICD-10-CM | POA: Diagnosis present

## 2019-07-26 DIAGNOSIS — F17210 Nicotine dependence, cigarettes, uncomplicated: Secondary | ICD-10-CM | POA: Diagnosis present

## 2019-07-26 DIAGNOSIS — Z923 Personal history of irradiation: Secondary | ICD-10-CM | POA: Diagnosis not present

## 2019-07-26 DIAGNOSIS — F1010 Alcohol abuse, uncomplicated: Secondary | ICD-10-CM | POA: Diagnosis present

## 2019-07-26 DIAGNOSIS — E1169 Type 2 diabetes mellitus with other specified complication: Secondary | ICD-10-CM

## 2019-07-26 DIAGNOSIS — E559 Vitamin D deficiency, unspecified: Secondary | ICD-10-CM | POA: Diagnosis present

## 2019-07-26 DIAGNOSIS — R0600 Dyspnea, unspecified: Secondary | ICD-10-CM | POA: Diagnosis present

## 2019-07-26 DIAGNOSIS — Z681 Body mass index (BMI) 19 or less, adult: Secondary | ICD-10-CM | POA: Diagnosis not present

## 2019-07-26 DIAGNOSIS — E1141 Type 2 diabetes mellitus with diabetic mononeuropathy: Secondary | ICD-10-CM | POA: Diagnosis present

## 2019-07-26 LAB — COMPREHENSIVE METABOLIC PANEL
ALT: 10 U/L (ref 0–44)
AST: 22 U/L (ref 15–41)
Albumin: 3.5 g/dL (ref 3.5–5.0)
Alkaline Phosphatase: 83 U/L (ref 38–126)
Anion gap: 9 (ref 5–15)
BUN: 22 mg/dL (ref 8–23)
CO2: 24 mmol/L (ref 22–32)
Calcium: 9.4 mg/dL (ref 8.9–10.3)
Chloride: 105 mmol/L (ref 98–111)
Creatinine, Ser: 0.65 mg/dL (ref 0.44–1.00)
GFR calc Af Amer: >60 mL/min (ref >60)
GFR calc non Af Amer: >60 mL/min (ref >60)
Glucose, Bld: 109 mg/dL — ABNORMAL HIGH (ref 70–99)
Potassium: 3.8 mmol/L (ref 3.5–5.1)
Sodium: 138 mmol/L (ref 135–145)
Total Bilirubin: 0.7 mg/dL (ref 0.3–1.2)
Total Protein: 6.5 g/dL (ref 6.5–8.1)

## 2019-07-26 LAB — CBC WITH DIFFERENTIAL/PLATELET
Abs Immature Granulocytes: 0.01 10*3/uL (ref 0.00–0.07)
Basophils Absolute: 0.1 10*3/uL (ref 0.0–0.1)
Basophils Relative: 1 %
Eosinophils Absolute: 0.1 10*3/uL (ref 0.0–0.5)
Eosinophils Relative: 2 %
HCT: 22.7 % — ABNORMAL LOW (ref 36.0–46.0)
Hemoglobin: 6.6 g/dL — CL (ref 12.0–15.0)
Immature Granulocytes: 0 %
Lymphocytes Relative: 33 %
Lymphs Abs: 2 10*3/uL (ref 0.7–4.0)
MCH: 21 pg — ABNORMAL LOW (ref 26.0–34.0)
MCHC: 29.1 g/dL — ABNORMAL LOW (ref 30.0–36.0)
MCV: 72.3 fL — ABNORMAL LOW (ref 80.0–100.0)
Monocytes Absolute: 0.7 10*3/uL (ref 0.1–1.0)
Monocytes Relative: 12 %
Neutro Abs: 3.2 10*3/uL (ref 1.7–7.7)
Neutrophils Relative %: 52 %
Platelets: 175 10*3/uL (ref 150–400)
RBC: 3.14 MIL/uL — ABNORMAL LOW (ref 3.87–5.11)
RDW: 19 % — ABNORMAL HIGH (ref 11.5–15.5)
WBC: 6.1 10*3/uL (ref 4.0–10.5)
nRBC: 0 % (ref 0.0–0.2)

## 2019-07-26 LAB — CBC
HCT: 27.7 % — ABNORMAL LOW (ref 36.0–46.0)
HCT: 29.8 % — ABNORMAL LOW (ref 36.0–46.0)
Hemoglobin: 8.1 g/dL — ABNORMAL LOW (ref 12.0–15.0)
Hemoglobin: 8.8 g/dL — ABNORMAL LOW (ref 12.0–15.0)
MCH: 21.9 pg — ABNORMAL LOW (ref 26.0–34.0)
MCH: 21.9 pg — ABNORMAL LOW (ref 26.0–34.0)
MCHC: 29.2 g/dL — ABNORMAL LOW (ref 30.0–36.0)
MCHC: 29.5 g/dL — ABNORMAL LOW (ref 30.0–36.0)
MCV: 74.9 fL — ABNORMAL LOW (ref 80.0–100.0)
MCV: 74.3 fL — ABNORMAL LOW (ref 80.0–100.0)
Platelets: 168 10*3/uL (ref 150–400)
Platelets: 176 10*3/uL (ref 150–400)
RBC: 3.7 MIL/uL — ABNORMAL LOW (ref 3.87–5.11)
RBC: 4.01 MIL/uL (ref 3.87–5.11)
RDW: 20.1 % — ABNORMAL HIGH (ref 11.5–15.5)
RDW: 19.9 % — ABNORMAL HIGH (ref 11.5–15.5)
WBC: 6 10*3/uL (ref 4.0–10.5)
WBC: 7.8 10*3/uL (ref 4.0–10.5)
nRBC: 0 % (ref 0.0–0.2)
nRBC: 0 % (ref 0.0–0.2)

## 2019-07-26 LAB — PREALBUMIN: Prealbumin: 12.3 mg/dL — ABNORMAL LOW (ref 18–38)

## 2019-07-26 LAB — GLUCOSE, CAPILLARY
Glucose-Capillary: 154 mg/dL — ABNORMAL HIGH (ref 70–99)
Glucose-Capillary: 106 mg/dL — ABNORMAL HIGH (ref 70–99)
Glucose-Capillary: 74 mg/dL (ref 70–99)
Glucose-Capillary: 125 mg/dL — ABNORMAL HIGH (ref 70–99)
Glucose-Capillary: 220 mg/dL — ABNORMAL HIGH (ref 70–99)
Glucose-Capillary: 75 mg/dL (ref 70–99)
Glucose-Capillary: 89 mg/dL (ref 70–99)

## 2019-07-26 LAB — SARS CORONAVIRUS 2 (TAT 6-24 HRS): SARS Coronavirus 2: NEGATIVE

## 2019-07-26 LAB — HIV ANTIBODY (ROUTINE TESTING W REFLEX): HIV Screen 4th Generation wRfx: NONREACTIVE

## 2019-07-26 LAB — HEMOGLOBIN A1C
Hgb A1c MFr Bld: 6.7 % — ABNORMAL HIGH (ref 4.8–5.6)
Mean Plasma Glucose: 145.59 mg/dL

## 2019-07-26 LAB — MAGNESIUM: Magnesium: 1.8 mg/dL (ref 1.7–2.4)

## 2019-07-26 LAB — PHOSPHORUS: Phosphorus: 3.5 mg/dL (ref 2.5–4.6)

## 2019-07-26 LAB — TSH: TSH: 2.487 u[IU]/mL (ref 0.350–4.500)

## 2019-07-26 MED ORDER — PEG-KCL-NACL-NASULF-NA ASC-C 100 G PO SOLR
0.5000 | Freq: Once | ORAL | Status: AC
  Administered 2019-07-27: 100 g via ORAL

## 2019-07-26 MED ORDER — SODIUM CHLORIDE 0.9% IV SOLUTION: Freq: Once | INTRAVENOUS | Status: DC

## 2019-07-26 MED ORDER — FUROSEMIDE 20 MG PO TABS
20.0000 mg | ORAL_TABLET | Freq: Every day | ORAL | Status: DC
  Administered 2019-07-26 – 2019-07-27 (×2): 20 mg via ORAL
  Filled 2019-07-26 (×2): qty 1

## 2019-07-26 MED ORDER — NADOLOL 20 MG PO TABS
20.0000 mg | ORAL_TABLET | Freq: Every day | ORAL | Status: DC
  Administered 2019-07-27 (×2): 20 mg via ORAL
  Filled 2019-07-26 (×3): qty 1

## 2019-07-26 MED ORDER — PEG-KCL-NACL-NASULF-NA ASC-C 100 G PO SOLR
0.5000 | Freq: Once | ORAL | Status: AC
  Administered 2019-07-26: 100 g via ORAL
  Filled 2019-07-26: qty 1

## 2019-07-26 MED ORDER — PEG-KCL-NACL-NASULF-NA ASC-C 100 G PO SOLR: 1.0000 | Freq: Once | ORAL | Status: DC

## 2019-07-26 MED ORDER — DEXTROSE 50 % IV SOLN
INTRAVENOUS | Status: AC
  Administered 2019-07-26: 25 mL
  Filled 2019-07-26: qty 50

## 2019-07-26 MED ORDER — BOOST / RESOURCE BREEZE PO LIQD CUSTOM
1.0000 | Freq: Three times a day (TID) | ORAL | Status: DC
  Administered 2019-07-26 – 2019-07-27 (×3): 1 via ORAL

## 2019-07-26 NOTE — H&P (View-Only) (Signed)
Referring Provider:  [facility]         Primary Care Physician:  Tower, [name], MD Primary Gastroenterologist:  Denzil Magnuson, MD            We were asked to see this patient for:   anemia               ASSESSMENT /  PLAN    69 year old female with PMH significant for EtOH and NASH cirrhosis, diabetes, diverticulosis, colon polyps, history of breast cancer, tobacco abuse  # Iron deficiency anemia. Etiology?  Possibly intermittent bleeding from portal gastropathy, AVMs, polyps --No overt GI bleeding, FOBT negative. --Hgb 6.5 in office yesterday. Improved to 7.6 after one unit of blood but down to 6.6 this am. Getting another unit of blood now.  --Await post transfusion hgb --Will arrange for EGD and colonoscopy to be done tomorrow. Patient will be scheduled for a colonoscopy. The risks and benefits of colonoscopy with possible polypectomy / biopsies were discussed and the patient agrees to proceed.   #History of adenomatous colon polyps --Due for surveillance colonoscopy August 2021. I light of IDA will get it done this admission  # EtOH/NASH cirrhosis ( MELD 11) with history of hepatic encephalopathy, small esophageal varices and portal hypertensive gastropathy.  --No evidence for decompensation at present.  --INR 1.3 --Up to date on varices screening.  Last EGD November 2020 -- [facility] screening: MRI liver September 2020 no hepatic lesions concerning for hepatocellular carcinoma.  AFP is normal . --Continue home lactulose, xifaxan --Corgard is on hold per [facility] ?  --Lasix on hold per [facility]. Will resume today. Renal function / sodium is okay --Continue home Aldactone ( only on 25 mg daily)  HPI:    Chief Complaint: none  Christine Barton is a 69 y.o. female with cirrhosis.  She was seen in the office 2 days ago, doing well at this time.  We obtained routine labs.  Surprisingly her hemoglobin returned to 6.5, MCV 64.  Hemoglobin in early November was 12 . We advised her to go to  the ED for transfusion. Eleanna denies overt GI bleeding. No abdominal pain, nausea or vomiting. No etoh in a year. No NSAID use. She has chronic SOB, attributes to smoking. Feels like SOB a little worse lately but otherwise feels okay. Hungry and wants to eat.   Past Medical History:  Diagnosis Date  . Alcohol abuse, unspecified   . Breast cancer ([place]) 1998   Right  . Cataract    left eye  . Cervical spondylosis 2006   MRI  . Degeneration of cervical intervertebral disc 2006   MRI  . Diabetes mellitus without complication ([place])   . Diverticulosis of colon (without mention of hemorrhage)   . Hyperpotassemia   . Microscopic hematuria   . Mononeuritis of unspecified site   . Nonspecific abnormal results of liver function study   . Other abnormal glucose   . Other and unspecified hyperlipidemia    no per pt  . Other chronic nonalcoholic liver disease   . Personal history of chemotherapy   . Personal history of malignant neoplasm of breast   . Personal history of radiation therapy   . Pneumothorax, acute    right, spontaneous  . Tobacco use disorder   . Unspecified vitamin D deficiency     Past Surgical History:  Procedure Laterality Date  . BREAST BIOPSY  9/03   Right  . BREAST LUMPECTOMY Right 1998  . BREAST LUMPECTOMY  [place]  11/02/2014  . COLONOSCOPY    . ESOPHAGOGASTRODUODENOSCOPY (EGD) WITH PROPOFOL N/A 10/30/2018   Procedure: ESOPHAGOGASTRODUODENOSCOPY (EGD) WITH PROPOFOL;  Surgeon: Mauri Pole, MD;  Location: [facility];  Service: Endoscopy;  Laterality: N/A;  . ESOPHAGOGASTRODUODENOSCOPY (EGD) WITH PROPOFOL N/A 03/12/2019   Procedure: ESOPHAGOGASTRODUODENOSCOPY (EGD) WITH PROPOFOL;  Surgeon: Mauri Pole, MD;  Location: [facility];  Service: Endoscopy;  Laterality: N/A;  . EYE SURGERY  02/2017   cataract extraction with lens implant-left  . MOUTH SURGERY    . TUBAL LIGATION      Prior to Admission medications   Medication  Sig Start Date End Date Taking? Authorizing Provider  albuterol (VENTOLIN HFA) 108 (90 Base) MCG/ACT inhaler Inhale 2 puffs into the lungs every 4 (four) hours as needed for wheezing or shortness of breath. 11/20/18  Yes Tower, [name], MD  furosemide (LASIX) 20 MG tablet Take daily Patient taking differently: Take 20 mg by mouth daily.  05/04/19  Yes Nandigam, Venia Minks, MD  lactulose (CHRONULAC) 10 GM/15ML solution Take 15 mLs (10 g total) by mouth 3 (three) times daily. Patient taking differently: Take 20 g by mouth See admin instructions. 2 tablespoons daily 06/28/18  Yes Nandigam, Venia Minks, MD  metFORMIN (GLUCOPHAGE XR) 500 MG 24 hr tablet Take 1 tablet (500 mg total) by mouth daily with breakfast. 02/07/19  Yes Tower, [name], MD  Multiple Vitamin (MULTIVITAMIN) tablet Take 1 tablet by mouth daily.   Yes [provider]  nadolol (CORGARD) 20 MG tablet Take 1 tablet (20 mg total) by mouth daily. 10/30/18 10/30/19 Yes Nandigam, Venia Minks, MD  pantoprazole (PROTONIX) 40 MG tablet TAKE 1 TABLET (40 MG TOTAL) BY MOUTH 2 (TWO) TIMES DAILY BEFORE A MEAL. 04/30/19  Yes Nandigam, Venia Minks, MD  Polyethyl Glycol-Propyl Glycol (SYSTANE OP) Place 1 drop into the left eye daily as needed (dryness).   Yes [provider]  rosuvastatin (CRESTOR) 5 MG tablet Take 1 tablet (5 mg total) by mouth daily at 6 PM. 02/15/19 07/06/23 Yes Donato Heinz, MD  spironolactone (ALDACTONE) 25 MG tablet Take 1 tablet (25 mg total) by mouth daily. 05/09/19  Yes Nandigam, Venia Minks, MD  traZODone (DESYREL) 50 MG tablet Take 0.5-1 tablets (25-50 mg total) by mouth at bedtime as needed for sleep. 07/06/19  Yes Tower, [name], MD  XIFAXAN 550 MG TABS tablet Take 1 tablet (550 mg total) by mouth 2 (two) times daily. 02/20/19  Yes Nandigam, Venia Minks, MD  ALPRAZolam (XANAX) 1 MG tablet Take 2.5 mg by mouth once. Borrowed one from friend only took one 4.14.21    [provider]    Current  Facility-Administered Medications  Medication Dose Route Frequency Provider Last Rate Last Admin  . 0.9 %  sodium chloride infusion (Manually program via Guardrails IV Fluids)   Intravenous Once Doutova, Anastassia, MD      . 0.9 %  sodium chloride infusion (Manually program via Guardrails IV Fluids)   Intravenous Once Lang Snow, FNP      . acetaminophen (TYLENOL) tablet 650 mg  650 mg Oral Q6H PRN Toy Baker, MD       Or  . acetaminophen (TYLENOL) suppository 650 mg  650 mg Rectal Q6H PRN Doutova, Anastassia, MD      . albuterol (PROVENTIL) (2.5 MG/3ML) 0.083% nebulizer solution 2.5 mg  2.5 mg Nebulization Q2H PRN Doutova, Anastassia, MD      . dextrose 50 % solution 12.5  g  12.5 g Intravenous STAT Doutova, Anastassia, MD      . docusate sodium (COLACE) capsule 100 mg  100 mg Oral BID Toy Baker, MD   100 mg at 07/25/19 2343  . guaiFENesin (MUCINEX) 12 hr tablet 600 mg  600 mg Oral BID Toy Baker, MD   600 mg at 07/25/19 2344  . HYDROcodone-acetaminophen (NORCO/VICODIN) 5-325 MG per tablet 1-2 tablet  1-2 tablet Oral Q4H PRN Doutova, Anastassia, MD      . insulin aspart (novoLOG) injection 0-9 Units  0-9 Units Subcutaneous Q4H Toy Baker, MD   2 Units at 07/26/19 0029  . lactulose (CHRONULAC) 10 GM/15ML solution 20 g  20 g Oral Daily Doutova, Anastassia, MD      . nicotine (NICODERM CQ - dosed in mg/24 hours) patch 21 mg  21 mg Transdermal Daily Doutova, Anastassia, MD      . ondansetron (ZOFRAN) tablet 4 mg  4 mg Oral Q6H PRN Doutova, Anastassia, MD       Or  . ondansetron (ZOFRAN) injection 4 mg  4 mg Intravenous Q6H PRN Doutova, Anastassia, MD      . pantoprazole (PROTONIX) injection 40 mg  40 mg Intravenous Q12H Doutova, Anastassia, MD   40 mg at 07/25/19 2343  . rifaximin (XIFAXAN) tablet 550 mg  550 mg Oral BID Toy Baker, MD   550 mg at 07/25/19 2343  . rosuvastatin (CRESTOR) tablet 5 mg  5 mg Oral q1800 Doutova, Anastassia, MD      .  sodium chloride flush (NS) 0.9 % injection 3 mL  3 mL Intravenous Q12H Doutova, Anastassia, MD   3 mL at 07/25/19 2344  . spironolactone (ALDACTONE) tablet 25 mg  25 mg Oral Daily Toy Baker, MD        Allergies as of 07/25/2019 - Review Complete 07/25/2019  Allergen Reaction Noted  . Oxycodone Nausea And Vomiting 11/02/2014  . Glipizide Other (See Comments) 09/12/2013    Family History  Problem Relation Age of Onset  . Heart failure Father   . Heart attack Father   . Colon cancer Maternal Uncle   . Stroke Mother   . Esophageal cancer Neg Hx   . Rectal cancer Neg Hx   . Stomach cancer Neg Hx     Social History   Socioeconomic History  . Marital status: Single    Spouse name: Not on file  . Number of children: 1  . Years of education: Not on file  . Highest education level: Not on file  Occupational History  . Occupation: retired    Fish farm manager: [employer]  Tobacco Use  . Smoking status: Current Every Day Smoker    Packs/day: 1.00    Years: 53.00    Pack years: 53.00    Types: Cigarettes  . Smokeless tobacco: Never Used  . Tobacco comment: tobacco info given 04/27/2018  Substance and Sexual Activity  . Alcohol use: Not Currently  . Drug use: No  . Sexual activity: Not Currently  Other Topics Concern  . Not on file  Social History Narrative   Divorced      1 child      Works at [employer] Strain: [place]   . Difficulty of Paying Living Expenses: Not hard at all  Food Insecurity: No Food Insecurity  . Worried About Charity fundraiser in the Last Year: Never true  . Ran  Out of Food in the Last Year: Never true  Transportation Needs: No Transportation Needs  . Lack of Transportation (Medical): No  . Lack of Transportation (Non-Medical): No  Physical Activity: Inactive  . Days of Exercise per Week: 0 days  . Minutes of Exercise per Session: 0 min  Stress: No Stress Concern  Present  . Feeling of Stress : Only a little  Social Connections:   . Frequency of Communication with Friends and Family:   . Frequency of Social Gatherings with Friends and Family:   . Attends Religious Services:   . Active Member of Clubs or Organizations:   . Attends Archivist Meetings:   Marland Kitchen Marital Status:   Intimate Partner Violence: Not At Risk  . Fear of Current or Ex-Partner: No  . Emotionally Abused: No  . Physically Abused: No  . Sexually Abused: No    Review of Systems: All systems reviewed and negative except where noted in HPI.  Physical Exam: Vital signs in last 24 hours: Temp:  [98.3 F (36.8 C)-98.5 F (36.9 C)] 98.3 F (36.8 C) (04/15 0431) Pulse Rate:  [77-89] 82 (04/15 0431) Resp:  [12-23] 16 (04/15 0431) BP: (112-157)/(46-89) 112/46 (04/15 0431) SpO2:  [96 %-100 %] 98 % (04/15 0431) Weight:  [46.9 kg-47.2 kg] 46.9 kg (04/14 2228) Last BM Date: 07/25/19 General:   Alert, thin female in NAD Psych:  Pleasant, cooperative. Normal mood and affect. Eyes:  Pupils equal, sclera clear, no icterus.   Conjunctiva pink. Ears:  Normal auditory acuity. Nose:  No deformity, discharge,  or lesions. Neck:  Supple; no masses Lungs:  Clear throughout to auscultation.   No wheezes, crackles, or rhonchi.  Heart:  Regular rate and rhythm; no murmurs, no lower extremity edema Abdomen:  Soft, non-distended, nontender, BS active, no palp mass   Rectal:  Deferred  Msk:  Symmetrical without gross deformities. . Neurologic:  Alert and  oriented x4;  grossly normal neurologically. Skin:  Intact without significant lesions or rashes.   Intake/Output from previous day: 04/14 0701 - 04/15 0700 In: 750.3 [I.V.:21.7; Blood:630; IV Piggyback:98.6] Out: -  Intake/Output this shift: No intake/output data recorded.  Lab Results: Recent Labs    07/25/19 1450 07/25/19 2341 07/26/19 0421  WBC 9.0 7.6 6.1  HGB 6.5* 7.6* 6.6*  HCT 23.2* 27.3* 22.7*  PLT 242 210 175    BMET Recent Labs    07/24/19 1422 07/25/19 1450 07/26/19 0421  NA 135 139 138  K 4.3 4.0 3.8  CL 103 105 105  CO2 25 25 24   GLUCOSE 168* 117* 109*  BUN 25* 27* 22  CREATININE 0.73 0.75 0.65  CALCIUM 9.8 9.6 9.4   LFT Recent Labs    07/26/19 0421  PROT 6.5  ALBUMIN 3.5  AST 22  ALT 10  ALKPHOS 83  BILITOT 0.7   PT/INR Recent Labs    07/24/19 1422 07/25/19 2027  LABPROT 13.4* 15.6*  INR 1.2* 1.3*   Hepatitis Panel No results for input(s): HEPBSAG, HCVAB, HEPAIGM, HEPBIGM in the last 72 hours.   . CBC Latest Ref Rng & Units 07/26/2019 07/25/2019 07/25/2019  WBC 4.0 - 10.5 K/uL 6.1 7.6 9.0  Hemoglobin 12.0 - 15.0 g/dL 6.6(LL) 7.6(L) 6.5(LL)  Hematocrit 36.0 - 46.0 % 22.7(L) 27.3(L) 23.2(L)  Platelets 150 - 400 K/uL 175 210 242    . CMP Latest Ref Rng & Units 07/26/2019 07/25/2019 07/24/2019  Glucose 70 - 99 mg/dL 109(H) 117(H) 168(H)  BUN 8 - 23  mg/dL 22 27(H) 25(H)  Creatinine 0.44 - 1.00 mg/dL 0.65 0.75 0.73  Sodium 135 - 145 mmol/L 138 139 135  Potassium 3.5 - 5.1 mmol/L 3.8 4.0 4.3  Chloride 98 - 111 mmol/L 105 105 103  CO2 22 - 32 mmol/L 24 25 25   Calcium 8.9 - 10.3 mg/dL 9.4 9.6 9.8  Total Protein 6.5 - 8.1 g/dL 6.5 7.6 7.3  Total Bilirubin 0.3 - 1.2 mg/dL 0.7 0.7 0.5  Alkaline Phos 38 - 126 U/L 83 93 116  AST 15 - 41 U/L 22 25 18   ALT 0 - 44 U/L 10 12 8    Studies/Results: DG Chest 2 View  Result Date: 07/25/2019 CLINICAL DATA:  Low hemoglobin EXAM: CHEST - 2 VIEW COMPARISON:  04/18/2018, CT 04/19/2018 FINDINGS: Mild reticular opacity on the right likely due to fibrosis demonstrated on prior CT. Mild asymmetric opacity at the right apex, likely corresponds to the CT demonstrated masslike fibrosis. Stable cardiomediastinal silhouette. No pneumothorax. Clips over the right axilla. IMPRESSION: No active cardiopulmonary disease. Asymmetric right apical opacity presumably corresponds to the CT area of masslike fibrosis. Electronically Signed   By: Donavan Foil M.D.   On: 07/25/2019 21:16    Active Problems:   Hyperlipidemia associated with type 2 diabetes mellitus ([place])   History of alcohol abuse   Controlled diabetes mellitus type 2 with complications ([place])   Cirrhosis of liver ([facility])   Anemia   Esophageal varices without bleeding ([facility])   Malnutrition ([facility])   Symptomatic anemia   Tobacco abuse    Tye Savoy, NP-C @  07/26/2019, 9:06 AM

## 2019-07-26 NOTE — Progress Notes (Addendum)
CRITICAL VALUE ALERT  Critical Value:  Hgb 6.6  Date & Time Notied:  07/25/19 8718  Provider Notified: Sharlet Salina  Orders Received/Actions taken: NO for 1 Unit of Blood to be transfused.

## 2019-07-26 NOTE — Consult Note (Signed)
Referring Provider:  [facility]         Primary Care Physician:  Tower, [name], MD Primary Gastroenterologist:  Denzil Magnuson, MD            We were asked to see this patient for:   anemia               ASSESSMENT /  PLAN    69 year old female with PMH significant for EtOH and NASH cirrhosis, diabetes, diverticulosis, colon polyps, history of breast cancer, tobacco abuse  # Iron deficiency anemia. Etiology?  Possibly intermittent bleeding from portal gastropathy, AVMs, polyps --No overt GI bleeding, FOBT negative. --Hgb 6.5 in office yesterday. Improved to 7.6 after one unit of blood but down to 6.6 this am. Getting another unit of blood now.  --Await post transfusion hgb --Will arrange for EGD and colonoscopy to be done tomorrow. Patient will be scheduled for a colonoscopy. The risks and benefits of colonoscopy with possible polypectomy / biopsies were discussed and the patient agrees to proceed.   #History of adenomatous colon polyps --Due for surveillance colonoscopy August 2021. I light of IDA will get it done this admission  # EtOH/NASH cirrhosis ( MELD 11) with history of hepatic encephalopathy, small esophageal varices and portal hypertensive gastropathy.  --No evidence for decompensation at present.  --INR 1.3 --Up to date on varices screening.  Last EGD November 2020 -- [facility] screening: MRI liver September 2020 no hepatic lesions concerning for hepatocellular carcinoma.  AFP is normal . --Continue home lactulose, xifaxan --Corgard is on hold per [facility] ?  --Lasix on hold per [facility]. Will resume today. Renal function / sodium is okay --Continue home Aldactone ( only on 25 mg daily)  HPI:    Chief Complaint: none  Christine Barton is a 69 y.o. female with cirrhosis.  She was seen in the office 2 days ago, doing well at this time.  We obtained routine labs.  Surprisingly her hemoglobin returned to 6.5, MCV 64.  Hemoglobin in early November was 12 . We advised her to go to  the ED for transfusion. Dyamond denies overt GI bleeding. No abdominal pain, nausea or vomiting. No etoh in a year. No NSAID use. She has chronic SOB, attributes to smoking. Feels like SOB a little worse lately but otherwise feels okay. Hungry and wants to eat.   Past Medical History:  Diagnosis Date  . Alcohol abuse, unspecified   . Breast cancer ([place]) 1998   Right  . Cataract    left eye  . Cervical spondylosis 2006   MRI  . Degeneration of cervical intervertebral disc 2006   MRI  . Diabetes mellitus without complication ([place])   . Diverticulosis of colon (without mention of hemorrhage)   . Hyperpotassemia   . Microscopic hematuria   . Mononeuritis of unspecified site   . Nonspecific abnormal results of liver function study   . Other abnormal glucose   . Other and unspecified hyperlipidemia    no per pt  . Other chronic nonalcoholic liver disease   . Personal history of chemotherapy   . Personal history of malignant neoplasm of breast   . Personal history of radiation therapy   . Pneumothorax, acute    right, spontaneous  . Tobacco use disorder   . Unspecified vitamin D deficiency     Past Surgical History:  Procedure Laterality Date  . BREAST BIOPSY  9/03   Right  . BREAST LUMPECTOMY Right 1998  . BREAST LUMPECTOMY  [place]  11/02/2014  . COLONOSCOPY    . ESOPHAGOGASTRODUODENOSCOPY (EGD) WITH PROPOFOL N/A 10/30/2018   Procedure: ESOPHAGOGASTRODUODENOSCOPY (EGD) WITH PROPOFOL;  Surgeon: Mauri Pole, MD;  Location: [facility] ENDOSCOPY;  Service: Endoscopy;  Laterality: N/A;  . ESOPHAGOGASTRODUODENOSCOPY (EGD) WITH PROPOFOL N/A 03/12/2019   Procedure: ESOPHAGOGASTRODUODENOSCOPY (EGD) WITH PROPOFOL;  Surgeon: Mauri Pole, MD;  Location: [facility] ENDOSCOPY;  Service: Endoscopy;  Laterality: N/A;  . EYE SURGERY  02/2017   cataract extraction with lens implant-left  . MOUTH SURGERY    . TUBAL LIGATION      Prior to Admission medications   Medication  Sig Start Date End Date Taking? Authorizing Provider  albuterol (VENTOLIN HFA) 108 (90 Base) MCG/ACT inhaler Inhale 2 puffs into the lungs every 4 (four) hours as needed for wheezing or shortness of breath. 11/20/18  Yes Tower, [name], MD  furosemide (LASIX) 20 MG tablet Take daily Patient taking differently: Take 20 mg by mouth daily.  05/04/19  Yes Nandigam, Venia Minks, MD  lactulose (CHRONULAC) 10 GM/15ML solution Take 15 mLs (10 g total) by mouth 3 (three) times daily. Patient taking differently: Take 20 g by mouth See admin instructions. 2 tablespoons daily 06/28/18  Yes Nandigam, Venia Minks, MD  metFORMIN (GLUCOPHAGE XR) 500 MG 24 hr tablet Take 1 tablet (500 mg total) by mouth daily with breakfast. 02/07/19  Yes Tower, [name], MD  Multiple Vitamin (MULTIVITAMIN) tablet Take 1 tablet by mouth daily.   Yes [provider]  nadolol (CORGARD) 20 MG tablet Take 1 tablet (20 mg total) by mouth daily. 10/30/18 10/30/19 Yes Nandigam, Venia Minks, MD  pantoprazole (PROTONIX) 40 MG tablet TAKE 1 TABLET (40 MG TOTAL) BY MOUTH 2 (TWO) TIMES DAILY BEFORE A MEAL. 04/30/19  Yes Nandigam, Venia Minks, MD  Polyethyl Glycol-Propyl Glycol (SYSTANE OP) Place 1 drop into the left eye daily as needed (dryness).   Yes [provider]  rosuvastatin (CRESTOR) 5 MG tablet Take 1 tablet (5 mg total) by mouth daily at 6 PM. 02/15/19 07/06/23 Yes Donato Heinz, MD  spironolactone (ALDACTONE) 25 MG tablet Take 1 tablet (25 mg total) by mouth daily. 05/09/19  Yes Nandigam, Venia Minks, MD  traZODone (DESYREL) 50 MG tablet Take 0.5-1 tablets (25-50 mg total) by mouth at bedtime as needed for sleep. 07/06/19  Yes Tower, [name], MD  XIFAXAN 550 MG TABS tablet Take 1 tablet (550 mg total) by mouth 2 (two) times daily. 02/20/19  Yes Nandigam, Venia Minks, MD  ALPRAZolam (XANAX) 1 MG tablet Take 2.5 mg by mouth once. Borrowed one from friend only took one 4.14.21    [provider]    Current  Facility-Administered Medications  Medication Dose Route Frequency Provider Last Rate Last Admin  . 0.9 %  sodium chloride infusion (Manually program via Guardrails IV Fluids)   Intravenous Once Doutova, Anastassia, MD      . 0.9 %  sodium chloride infusion (Manually program via Guardrails IV Fluids)   Intravenous Once Lang Snow, FNP      . acetaminophen (TYLENOL) tablet 650 mg  650 mg Oral Q6H PRN Toy Baker, MD       Or  . acetaminophen (TYLENOL) suppository 650 mg  650 mg Rectal Q6H PRN Doutova, Anastassia, MD      . albuterol (PROVENTIL) (2.5 MG/3ML) 0.083% nebulizer solution 2.5 mg  2.5 mg Nebulization Q2H PRN Doutova, Anastassia, MD      . dextrose 50 % solution 12.5  g  12.5 g Intravenous STAT Doutova, Anastassia, MD      . docusate sodium (COLACE) capsule 100 mg  100 mg Oral BID Toy Baker, MD   100 mg at 07/25/19 2343  . guaiFENesin (MUCINEX) 12 hr tablet 600 mg  600 mg Oral BID Toy Baker, MD   600 mg at 07/25/19 2344  . HYDROcodone-acetaminophen (NORCO/VICODIN) 5-325 MG per tablet 1-2 tablet  1-2 tablet Oral Q4H PRN Doutova, Anastassia, MD      . insulin aspart (novoLOG) injection 0-9 Units  0-9 Units Subcutaneous Q4H Toy Baker, MD   2 Units at 07/26/19 0029  . lactulose (CHRONULAC) 10 GM/15ML solution 20 g  20 g Oral Daily Doutova, Anastassia, MD      . nicotine (NICODERM CQ - dosed in mg/24 hours) patch 21 mg  21 mg Transdermal Daily Doutova, Anastassia, MD      . ondansetron (ZOFRAN) tablet 4 mg  4 mg Oral Q6H PRN Doutova, Anastassia, MD       Or  . ondansetron (ZOFRAN) injection 4 mg  4 mg Intravenous Q6H PRN Doutova, Anastassia, MD      . pantoprazole (PROTONIX) injection 40 mg  40 mg Intravenous Q12H Doutova, Anastassia, MD   40 mg at 07/25/19 2343  . rifaximin (XIFAXAN) tablet 550 mg  550 mg Oral BID Toy Baker, MD   550 mg at 07/25/19 2343  . rosuvastatin (CRESTOR) tablet 5 mg  5 mg Oral q1800 Doutova, Anastassia, MD      .  sodium chloride flush (NS) 0.9 % injection 3 mL  3 mL Intravenous Q12H Doutova, Anastassia, MD   3 mL at 07/25/19 2344  . spironolactone (ALDACTONE) tablet 25 mg  25 mg Oral Daily Toy Baker, MD        Allergies as of 07/25/2019 - Review Complete 07/25/2019  Allergen Reaction Noted  . Oxycodone Nausea And Vomiting 11/02/2014  . Glipizide Other (See Comments) 09/12/2013    Family History  Problem Relation Age of Onset  . Heart failure Father   . Heart attack Father   . Colon cancer Maternal Uncle   . Stroke Mother   . Esophageal cancer Neg Hx   . Rectal cancer Neg Hx   . Stomach cancer Neg Hx     Social History   Socioeconomic History  . Marital status: Single    Spouse name: Not on file  . Number of children: 1  . Years of education: Not on file  . Highest education level: Not on file  Occupational History  . Occupation: retired    Fish farm manager: [employer]  Tobacco Use  . Smoking status: Current Every Day Smoker    Packs/day: 1.00    Years: 53.00    Pack years: 53.00    Types: Cigarettes  . Smokeless tobacco: Never Used  . Tobacco comment: tobacco info given 04/27/2018  Substance and Sexual Activity  . Alcohol use: Not Currently  . Drug use: No  . Sexual activity: Not Currently  Other Topics Concern  . Not on file  Social History Narrative   Divorced      1 child      Works at [employer] Strain: [place]   . Difficulty of Paying Living Expenses: Not hard at all  Food Insecurity: No Food Insecurity  . Worried About Charity fundraiser in the Last Year: Never true  . Ran  Out of Food in the Last Year: Never true  Transportation Needs: No Transportation Needs  . Lack of Transportation (Medical): No  . Lack of Transportation (Non-Medical): No  Physical Activity: Inactive  . Days of Exercise per Week: 0 days  . Minutes of Exercise per Session: 0 min  Stress: No Stress Concern  Present  . Feeling of Stress : Only a little  Social Connections:   . Frequency of Communication with Friends and Family:   . Frequency of Social Gatherings with Friends and Family:   . Attends Religious Services:   . Active Member of Clubs or Organizations:   . Attends Archivist Meetings:   Marland Kitchen Marital Status:   Intimate Partner Violence: Not At Risk  . Fear of Current or Ex-Partner: No  . Emotionally Abused: No  . Physically Abused: No  . Sexually Abused: No    Review of Systems: All systems reviewed and negative except where noted in HPI.  Physical Exam: Vital signs in last 24 hours: Temp:  [98.3 F (36.8 C)-98.5 F (36.9 C)] 98.3 F (36.8 C) (04/15 0431) Pulse Rate:  [77-89] 82 (04/15 0431) Resp:  [12-23] 16 (04/15 0431) BP: (112-157)/(46-89) 112/46 (04/15 0431) SpO2:  [96 %-100 %] 98 % (04/15 0431) Weight:  [46.9 kg-47.2 kg] 46.9 kg (04/14 2228) Last BM Date: 07/25/19 General:   Alert, thin female in NAD Psych:  Pleasant, cooperative. Normal mood and affect. Eyes:  Pupils equal, sclera clear, no icterus.   Conjunctiva pink. Ears:  Normal auditory acuity. Nose:  No deformity, discharge,  or lesions. Neck:  Supple; no masses Lungs:  Clear throughout to auscultation.   No wheezes, crackles, or rhonchi.  Heart:  Regular rate and rhythm; no murmurs, no lower extremity edema Abdomen:  Soft, non-distended, nontender, BS active, no palp mass   Rectal:  Deferred  Msk:  Symmetrical without gross deformities. . Neurologic:  Alert and  oriented x4;  grossly normal neurologically. Skin:  Intact without significant lesions or rashes.   Intake/Output from previous day: 04/14 0701 - 04/15 0700 In: 750.3 [I.V.:21.7; Blood:630; IV Piggyback:98.6] Out: -  Intake/Output this shift: No intake/output data recorded.  Lab Results: Recent Labs    07/25/19 1450 07/25/19 2341 07/26/19 0421  WBC 9.0 7.6 6.1  HGB 6.5* 7.6* 6.6*  HCT 23.2* 27.3* 22.7*  PLT 242 210 175    BMET Recent Labs    07/24/19 1422 07/25/19 1450 07/26/19 0421  NA 135 139 138  K 4.3 4.0 3.8  CL 103 105 105  CO2 25 25 24   GLUCOSE 168* 117* 109*  BUN 25* 27* 22  CREATININE 0.73 0.75 0.65  CALCIUM 9.8 9.6 9.4   LFT Recent Labs    07/26/19 0421  PROT 6.5  ALBUMIN 3.5  AST 22  ALT 10  ALKPHOS 83  BILITOT 0.7   PT/INR Recent Labs    07/24/19 1422 07/25/19 2027  LABPROT 13.4* 15.6*  INR 1.2* 1.3*   Hepatitis Panel No results for input(s): HEPBSAG, HCVAB, HEPAIGM, HEPBIGM in the last 72 hours.   . CBC Latest Ref Rng & Units 07/26/2019 07/25/2019 07/25/2019  WBC 4.0 - 10.5 K/uL 6.1 7.6 9.0  Hemoglobin 12.0 - 15.0 g/dL 6.6(LL) 7.6(L) 6.5(LL)  Hematocrit 36.0 - 46.0 % 22.7(L) 27.3(L) 23.2(L)  Platelets 150 - 400 K/uL 175 210 242    . CMP Latest Ref Rng & Units 07/26/2019 07/25/2019 07/24/2019  Glucose 70 - 99 mg/dL 109(H) 117(H) 168(H)  BUN 8 - 23  mg/dL 22 27(H) 25(H)  Creatinine 0.44 - 1.00 mg/dL 0.65 0.75 0.73  Sodium 135 - 145 mmol/L 138 139 135  Potassium 3.5 - 5.1 mmol/L 3.8 4.0 4.3  Chloride 98 - 111 mmol/L 105 105 103  CO2 22 - 32 mmol/L 24 25 25   Calcium 8.9 - 10.3 mg/dL 9.4 9.6 9.8  Total Protein 6.5 - 8.1 g/dL 6.5 7.6 7.3  Total Bilirubin 0.3 - 1.2 mg/dL 0.7 0.7 0.5  Alkaline Phos 38 - 126 U/L 83 93 116  AST 15 - 41 U/L 22 25 18   ALT 0 - 44 U/L 10 12 8    Studies/Results: DG Chest 2 View  Result Date: 07/25/2019 CLINICAL DATA:  Low hemoglobin EXAM: CHEST - 2 VIEW COMPARISON:  04/18/2018, CT 04/19/2018 FINDINGS: Mild reticular opacity on the right likely due to fibrosis demonstrated on prior CT. Mild asymmetric opacity at the right apex, likely corresponds to the CT demonstrated masslike fibrosis. Stable cardiomediastinal silhouette. No pneumothorax. Clips over the right axilla. IMPRESSION: No active cardiopulmonary disease. Asymmetric right apical opacity presumably corresponds to the CT area of masslike fibrosis. Electronically Signed   By: Donavan Foil M.D.   On: 07/25/2019 21:16    Active Problems:   Hyperlipidemia associated with type 2 diabetes mellitus ([place])   History of alcohol abuse   Controlled diabetes mellitus type 2 with complications ([place])   Cirrhosis of liver (H[facility])   Anemia   Esophageal varices without bleeding (H[facility])   Malnutrition (HCC)   Symptomatic anemia   Tobacco abuse    Tye Savoy, NP-C @  07/26/2019, 9:06 AM

## 2019-07-26 NOTE — Progress Notes (Signed)
PROGRESS NOTE    Christine Barton  JQZ:[id number] DOB: September 20, 1950 DOA: 07/25/2019 PCP: [name], MD    Chief Complaint  Patient presents with  . Abnormal Lab    Brief Narrative:  Patient is a pleasant 69 year old female history of cirrhosis secondary to alcohol and [name], history of alcohol abuse, history of ongoing tobacco use, history of breast cancer, diabetes mellitus type 2, hyperlipidemia, who was seen earlier on at her gastroenterologist office by Dr. [name] routine labs noted were significant for microcytic anemia with hemoglobin of 6.5, MCV of 64.9.  Patient was instructed to present to the ED for further evaluation and transfusion.  Patient denied any overt bleeding.  Patient endorses decreased oral intake and weight loss of about 20 pounds over the past month with associated fatigue and lightheadedness.  Patient admitted for further evaluation and GI consulted.   Assessment & Plan:   Active Problems:   Hyperlipidemia associated with type 2 diabetes mellitus ([place])   History of alcohol abuse   Controlled diabetes mellitus type 2 with complications ([facility])   Cirrhosis of liver (HCC)   Anemia   Esophageal varices without bleeding (HCC)   Malnutrition (HCC)   Symptomatic anemia   Tobacco abuse  1 severe iron deficiency anemia/symptomatic anemia Patient presenting secondary to anemia noted on lab work with a hemoglobin of 6.5 with some associated fatigue and lightheadedness.  Patient transfused a unit of packed red blood cells in the ED.  Patient denies any overt bleeding.  FOBT negative.  Hemoglobin on admission was patient transfused 1 unit of packed red blood cells with hemoglobin going up to 7.6 and subsequently down to 6.6.  Patient currently receiving second unit of packed red blood cells.  Anemia panel consistent with a severe iron deficiency anemia with a iron level of 10, ferritin of 5, TIBC of 648, folate of 66.7.  Vitamin B12 level of 726.  Patient seen in  consultation by gastroenterology and patient for probable upper endoscopy and colonoscopy tomorrow.  Patient currently on clears.??  IV Feraheme however will defer to GI.  GI following and appreciate input and recommendations.  2.  History of adenomatous colonic polyps Patient for colonoscopy probably tomorrow.  Per GI.  3.  Hyperlipidemia Stable.  Continue statin.  4.  History of alcohol abuse Currently in remission.  Patient with no signs of alcohol withdrawal.  Follow.  5.  Cirrhosis of the liver/NASH/history of alcohol use Patient noted to have some small varices with portal hypertensive gastropathy.  Patient with history of hepatic encephalopathy.  Patient currently stable with no signs of decompensation at this time.  Continue lactulose, Xifaxan, Aldactone.  Resume home regimen Lasix today.  Per GI.  6.  Asthma/copd Patient states some intermittent chronic wheezing and cough likely COPD component.  Patient on albuterol nebs as needed as well as scheduled duo nebs.  Tobacco cessation stressed to patient.  Chest x-ray done shows an area of nonacute area of fibrosis noted on prior CT felt to be nonmalignant.  Outpatient follow-up with PCP.  7.  Tobacco abuse Tobacco cessation.  Nicotine patch.  8.  Well-controlled diabetes mellitus type 2 Hemoglobin A1c 6.7.  CBG of 125 this morning.  Discontinue every 2 hour CBG checks.  Continue every 4 hours CBG checks.  Sliding scale insulin.  Hold oral hypoglycemic agents.  9.  History of esophageal varices without bleeding Patient being followed by GI.  Resume home regimen nadolol.  Patient for upper endoscopy tomorrow.  DVT prophylaxis: SCDs Code Status: Full Family Communication: Updated patient.  No family at bedside. Disposition:   Status is: Inpatient    Dispo: The patient is from: Home              Anticipated d/c is to: Home              Anticipated d/c date is: To be determined.  Hopefully in the next 2 to 3 days.               Patient currently awaiting further evaluation of anemia by gastroenterology.  Patient potentially for upper endoscopy and colonoscopy tomorrow.  Likely home when hemoglobin stable, clinically improved and cleared by GI.        Consultants:   Gastroenterology: Dr. Rush Landmark 4 07/26/2019  Procedures:   Chest x-ray 07/25/2019    Antimicrobials:  None   Subjective: Patient sitting up in bed.  Denies any chest pain or shortness of breath.  No abdominal pain.  Complaining of feeling hungry.  Denies any overt bleeding.  Patient currently receiving second unit of packed red blood cells.  Objective: Vitals:   07/26/19 0431 07/26/19 0912 07/26/19 0947 07/26/19 1216  BP: (!) 112/46 118/69 130/64 (!) 143/81  Pulse: 82 78 78 79  Resp: 16 16  18   Temp: 98.3 F (36.8 C) 98.7 F (37.1 C) 98.2 F (36.8 C) 98 F (36.7 C)  TempSrc: Oral Oral Oral Oral  SpO2: 98% 98% 100% 100%  Weight:      Height:        Intake/Output Summary (Last 24 hours) at 07/26/2019 1850 Last data filed at 07/26/2019 1700 Gross per 24 hour  Intake 1330.28 ml  Output --  Net 1330.28 ml   Filed Weights   07/25/19 1240 07/25/19 2228  Weight: 47.2 kg 46.9 kg    Examination:  General exam: NAD Respiratory system: Lungs clear to auscultation bilaterally.  No wheezes, no crackles, no rhonchi.  Normal respiratory effort.  Cardiovascular system: RRR no murmurs rubs or gallops.  No JVD.  No lower extremity edema. Gastrointestinal system: Abdomen is nondistended, soft and nontender. No organomegaly or masses felt. Normal bowel sounds heard. Central nervous system: Alert and oriented. No focal neurological deficits. Extremities: Symmetric 5 x 5 power. Skin: No rashes, lesions or ulcers Psychiatry: Judgement and insight appear normal. Mood & affect appropriate.     Data Reviewed: I have personally reviewed following labs and imaging studies  CBC: Recent Labs  Lab 07/24/19 1422 07/25/19 1450  07/25/19 2341 07/26/19 0421 07/26/19 1444  WBC 8.3 9.0 7.6 6.1 6.0  NEUTROABS 5.2  --   --  3.2  --   HGB 6.5 Repeated and verified X2.* 6.5* 7.6* 6.6* 8.1*  HCT 21.3 Repeated and verified X2.* 23.2* 27.3* 22.7* 27.7*  MCV 64.9 Repeated and verified X2.* 70.3* 74.2* 72.3* 74.9*  PLT 231.0 242 210 175 500    Basic Metabolic Panel: Recent Labs  Lab 07/24/19 1422 07/25/19 1450 07/25/19 1640 07/26/19 0421  NA 135 139  --  138  K 4.3 4.0  --  3.8  CL 103 105  --  105  CO2 25 25  --  24  GLUCOSE 168* 117*  --  109*  BUN 25* 27*  --  22  CREATININE 0.73 0.75  --  0.65  CALCIUM 9.8 9.6  --  9.4  MG  --   --  1.4* 1.8  PHOS  --   --  3.9 3.5  GFR: Estimated Creatinine Clearance: 49.8 mL/min (by C-G formula based on SCr of 0.65 mg/dL).  Liver Function Tests: Recent Labs  Lab 07/24/19 1422 07/25/19 1450 07/26/19 0421  AST 18 25 22   ALT 8 12 10   ALKPHOS 116 93 83  BILITOT 0.5 0.7 0.7  PROT 7.3 7.6 6.5  ALBUMIN 4.2 4.0 3.5    CBG: Recent Labs  Lab 07/26/19 0427 07/26/19 0651 07/26/19 0748 07/26/19 1136 07/26/19 1608  GLUCAP 106* 74 125* 220* 75     Recent Results (from the past 240 hour(s))  SARS CORONAVIRUS 2 (TAT 6-24 HRS) Nasopharyngeal Nasopharyngeal Swab     Status: None   Collection Time: 07/25/19  7:58 PM   Specimen: Nasopharyngeal Swab  Result Value Ref Range Status   SARS Coronavirus 2 NEGATIVE NEGATIVE Final    Comment: (NOTE) SARS-CoV-2 target nucleic acids are NOT DETECTED. The SARS-CoV-2 RNA is generally detectable in upper and lower respiratory specimens during the acute phase of infection. Negative results do not preclude SARS-CoV-2 infection, do not rule out co-infections with other pathogens, and should not be used as the sole basis for treatment or other patient management decisions. Negative results must be combined with clinical observations, patient history, and epidemiological information. The expected result is Negative. Fact  Sheet for Patients: SugarRoll.be Fact Sheet for Healthcare Providers: https://www.woods-mathews.com/ This test is not yet approved or cleared by the [place] FDA and  has been authorized for detection and/or diagnosis of SARS-CoV-2 by FDA under an Emergency Use Authorization (EUA). This EUA will remain  in effect (meaning this test can be used) for the duration of the COVID-19 declaration under Section 56 4(b)(1) of the Act, 21 U.S.C. section 360bbb-3(b)(1), unless the authorization is terminated or revoked sooner. Performed at [facility], [place] [address] [place], [place] [address]          Radiology Studies: DG Chest 2 View  Result Date: 07/25/2019 CLINICAL DATA:  Low hemoglobin EXAM: CHEST - 2 VIEW COMPARISON:  04/18/2018, CT 04/19/2018 FINDINGS: Mild reticular opacity on the right likely due to fibrosis demonstrated on prior CT. Mild asymmetric opacity at the right apex, likely corresponds to the CT demonstrated masslike fibrosis. Stable cardiomediastinal silhouette. No pneumothorax. Clips over the right axilla. IMPRESSION: No active cardiopulmonary disease. Asymmetric right apical opacity presumably corresponds to the CT area of masslike fibrosis. Electronically Signed   By: Donavan Foil M.D.   On: 07/25/2019 21:16        Scheduled Meds: . sodium chloride   Intravenous Once  . sodium chloride   Intravenous Once  . dextrose  12.5 g Intravenous STAT  . docusate sodium  100 mg Oral BID  . feeding supplement  1 Container Oral TID BM  . furosemide  20 mg Oral Daily  . guaiFENesin  600 mg Oral BID  . insulin aspart  0-9 Units Subcutaneous Q4H  . lactulose  20 g Oral Daily  . nicotine  21 mg Transdermal Daily  . pantoprazole (PROTONIX) IV  40 mg Intravenous Q12H  . [START ON 07/27/2019] peg 3350 powder  0.5 kit Oral Once  . rifaximin  550 mg Oral BID  . rosuvastatin  5 mg Oral q1800  . sodium chloride flush  3 mL  Intravenous Q12H  . spironolactone  25 mg Oral Daily   Continuous Infusions:   LOS: 0 days    Time spent: 40 minutes    Irine Seal, MD [facility]   To contact the attending provider  between 7A-7P or the covering provider during after hours 7P-7A, please log into the web site [email or web address] and access using universal [place] password for that web site. If you do not have the password, please call the hospital operator.  07/26/2019, 6:50 PM

## 2019-07-26 NOTE — Progress Notes (Signed)
Initial Nutrition Assessment  DOCUMENTATION CODES:   Severe malnutrition in context of chronic illness, Underweight  INTERVENTION:  Boost Breeze po TID, each supplement provides 250 kcal and 9 grams of protein  Cirrhosis Nutrition Education  Advance diet as medically feasible  With diet advancement will provide Ensure Enlive and CIB supplements (pt likes chocolate and vanilla flavors)  NUTRITION DIAGNOSIS:   Severe Malnutrition related to chronic illness(cirrhosis) as evidenced by severe fat depletion, moderate muscle depletion, severe muscle depletion.    GOAL:   Patient will meet greater than or equal to 90% of their needs    MONITOR:   PO intake, Supplement acceptance, Labs, Weight trends, I & O's, Diet advancement  REASON FOR ASSESSMENT:   Malnutrition Screening Tool, Consult Malnutrition Eval  ASSESSMENT:  69 year old female with past medical history significant of EtOH abuse and NASH cirrhosis, history of breast cancer, DM2, HLD presented with abnormal lab results notable for significant microcytic anemia, Hgb 6.5 from GI office and reports decreased po intake and 20 lb weight loss over the past few months.  Patient admitted on 4/14 for iron deficiency anemia of unknown etiology.  Patient awake and alert this afternoon, sitting up in bed eating ice chips. She reports consuming 100% of broth prior to RD visit. Patient stated that she is not a "big eater" in general, recalls having a couple of bites of a biscuit yesterday morning. She endorses 2 high calorie/high protein supplements daily and recalls eating kid's meals from [employer]  caf[employer]ia and ham/egg biscuits from [place]. RD educated on reducing dietary sodium intake, encouraged small frequent meals throughout the day, and discussed ways to increase calorie/protein intake. RD will provide "Cirrhosis Nutrition Therapy" handout from the Academy of [employer]etics.   Per notes: -no overt GI bleeding, FOBT  negative -plans for EGD and colonoscopy tomorrow -s/p 2 units PRBC  Current wt 103.18 lbs Weight history reviewed and appear stable over the past 5 months. On 02/13/19 pt weighed 99.88 lbs, on 03/12/19 pt weighed 102.74 lbs, on 03/30/19 pt weighed 99.22 lbs, on 05/09/19 pt weighed 103.84 lbs, and on 05/17/19 pt weighed 104.28 lbs.  NUTRITION - FOCUSED PHYSICAL EXAM:    Most Recent Value  Orbital Region  Severe depletion  Upper Arm Region  Severe depletion  Buccal Region  Severe depletion  Temple Region  Moderate depletion  Clavicle Bone Region  Severe depletion  Clavicle and Acromion Bone Region  Severe depletion  Scapular Bone Region  Moderate depletion  Dorsal Hand  Severe depletion  Patellar Region  Unable to assess  Anterior Thigh Region  Unable to assess  Posterior Calf Region  Unable to assess  Edema (RD Assessment)  None  Hair  Reviewed  Eyes  Reviewed  Mouth  Reviewed  Skin  Reviewed  Nails  Reviewed      Diet Order:   Diet Order            Diet NPO time specified Except for: Sips with Meds  Diet effective midnight        Diet clear liquid Room service appropriate? Yes; Fluid consistency: Thin  Diet effective now              EDUCATION NEEDS:   Education needs have been addressed  Skin:  Skin Assessment: Reviewed RN Assessment  Last BM:  4/14  Height:   Ht Readings from Last 1 Encounters:  07/25/19 5' 5"  (1.651 m)    Weight:   Wt Readings from Last 1  Encounters:  07/25/19 46.9 kg    Ideal Body Weight:  56.8 kg  BMI:  Body mass index is 17.19 kg/m.  Estimated Nutritional Needs:   Kcal:  1700-1900  Protein:  85-95  Fluid:  >/= 1.5 L/day    Christine Barton, RD, LDN Clinical Nutrition After Hours/Weekend Pager # in [phone]

## 2019-07-26 NOTE — Telephone Encounter (Signed)
Pt currently admitted.

## 2019-07-27 ENCOUNTER — Inpatient Hospital Stay (HOSPITAL_COMMUNITY): Payer: Medicare HMO | Admitting: Certified Registered Nurse Anesthetist

## 2019-07-27 ENCOUNTER — Encounter (HOSPITAL_COMMUNITY)
Admission: EM | Discharge status: Against Medical Advice | Payer: Self-pay | Source: Home / Self Care | Attending: Internal Medicine

## 2019-07-27 ENCOUNTER — Other Ambulatory Visit: Payer: Self-pay

## 2019-07-27 DIAGNOSIS — E43 Unspecified severe protein-calorie malnutrition: Secondary | ICD-10-CM | POA: Insufficient documentation

## 2019-07-27 DIAGNOSIS — K5520 Angiodysplasia of colon without hemorrhage: Secondary | ICD-10-CM

## 2019-07-27 DIAGNOSIS — D126 Benign neoplasm of colon, unspecified: Secondary | ICD-10-CM

## 2019-07-27 DIAGNOSIS — I8510 Secondary esophageal varices without bleeding: Secondary | ICD-10-CM

## 2019-07-27 DIAGNOSIS — K2970 Gastritis, unspecified, without bleeding: Secondary | ICD-10-CM

## 2019-07-27 DIAGNOSIS — K635 Polyp of colon: Secondary | ICD-10-CM

## 2019-07-27 HISTORY — PX: COLONOSCOPY WITH PROPOFOL: SHX5780

## 2019-07-27 HISTORY — PX: POLYPECTOMY: SHX5525

## 2019-07-27 HISTORY — PX: ESOPHAGOGASTRODUODENOSCOPY (EGD) WITH PROPOFOL: SHX5813

## 2019-07-27 HISTORY — PX: HOT HEMOSTASIS: SHX5433

## 2019-07-27 HISTORY — PX: BIOPSY: SHX5522

## 2019-07-27 LAB — GLUCOSE, CAPILLARY
Glucose-Capillary: 102 mg/dL — ABNORMAL HIGH (ref 70–99)
Glucose-Capillary: 105 mg/dL — ABNORMAL HIGH (ref 70–99)
Glucose-Capillary: 126 mg/dL — ABNORMAL HIGH (ref 70–99)
Glucose-Capillary: 178 mg/dL — ABNORMAL HIGH (ref 70–99)

## 2019-07-27 LAB — TYPE AND SCREEN
ABO/RH(D): O POS
Antibody Screen: NEGATIVE
Unit division: 0
Unit division: 0

## 2019-07-27 LAB — BPAM RBC
Blood Product Expiration Date: 202105182359
Blood Product Expiration Date: 202105182359
ISSUE DATE / TIME: 202104141718
ISSUE DATE / TIME: 202104150924
Unit Type and Rh: 5100
Unit Type and Rh: 5100

## 2019-07-27 LAB — CBC
HCT: 28.1 % — ABNORMAL LOW (ref 36.0–46.0)
Hemoglobin: 8.2 g/dL — ABNORMAL LOW (ref 12.0–15.0)
MCH: 22 pg — ABNORMAL LOW (ref 26.0–34.0)
MCHC: 29.2 g/dL — ABNORMAL LOW (ref 30.0–36.0)
MCV: 75.3 fL — ABNORMAL LOW (ref 80.0–100.0)
Platelets: 151 10*3/uL (ref 150–400)
RBC: 3.73 MIL/uL — ABNORMAL LOW (ref 3.87–5.11)
RDW: 19.9 % — ABNORMAL HIGH (ref 11.5–15.5)
WBC: 6.5 10*3/uL (ref 4.0–10.5)
nRBC: 0 % (ref 0.0–0.2)

## 2019-07-27 LAB — BASIC METABOLIC PANEL
Anion gap: 9 (ref 5–15)
BUN: 10 mg/dL (ref 8–23)
CO2: 20 mmol/L — ABNORMAL LOW (ref 22–32)
Calcium: 9 mg/dL (ref 8.9–10.3)
Chloride: 105 mmol/L (ref 98–111)
Creatinine, Ser: 0.62 mg/dL (ref 0.44–1.00)
GFR calc Af Amer: >60 mL/min (ref >60)
GFR calc non Af Amer: >60 mL/min (ref >60)
Glucose, Bld: 258 mg/dL — ABNORMAL HIGH (ref 70–99)
Potassium: 3.8 mmol/L (ref 3.5–5.1)
Sodium: 134 mmol/L — ABNORMAL LOW (ref 135–145)

## 2019-07-27 LAB — MAGNESIUM: Magnesium: 1.5 mg/dL — ABNORMAL LOW (ref 1.7–2.4)

## 2019-07-27 SURGERY — COLONOSCOPY WITH PROPOFOL
Anesthesia: Monitor Anesthesia Care

## 2019-07-27 MED ORDER — MAGNESIUM OXIDE 400 (241.3 MG) MG PO TABS: 400.0000 mg | ORAL_TABLET | Freq: Two times a day (BID) | ORAL | Status: DC

## 2019-07-27 MED ORDER — ALBUMIN HUMAN 5 % IV SOLN
INTRAVENOUS | Status: AC
  Filled 2019-07-27: qty 250

## 2019-07-27 MED ORDER — LACTATED RINGERS IV SOLN: INTRAVENOUS | Status: DC

## 2019-07-27 MED ORDER — PROPOFOL 1000 MG/100ML IV EMUL
INTRAVENOUS | Status: AC
  Filled 2019-07-27: qty 200

## 2019-07-27 MED ORDER — MAGNESIUM SULFATE 4 GM/100ML IV SOLN
4.0000 g | Freq: Once | INTRAVENOUS | Status: AC
  Administered 2019-07-27: 4 g via INTRAVENOUS
  Filled 2019-07-27: qty 100

## 2019-07-27 MED ORDER — PROPOFOL 1000 MG/100ML IV EMUL
INTRAVENOUS | Status: AC
  Filled 2019-07-27: qty 100

## 2019-07-27 MED ORDER — ENSURE ENLIVE PO LIQD: 237.0000 mL | Freq: Two times a day (BID) | ORAL | Status: DC

## 2019-07-27 MED ORDER — LIDOCAINE 2% (20 MG/ML) 5 ML SYRINGE
INTRAMUSCULAR | Status: DC | PRN
  Administered 2019-07-27: 60 mg via INTRAVENOUS

## 2019-07-27 MED ORDER — SODIUM CHLORIDE 0.9 % IV SOLN
510.0000 mg | Freq: Once | INTRAVENOUS | Status: AC
  Administered 2019-07-27: 510 mg via INTRAVENOUS
  Filled 2019-07-27: qty 510

## 2019-07-27 MED ORDER — PROPOFOL 500 MG/50ML IV EMUL
INTRAVENOUS | Status: DC | PRN
  Administered 2019-07-27: 60 mg via INTRAVENOUS
  Administered 2019-07-27: 75 ug/kg/min via INTRAVENOUS

## 2019-07-27 SURGICAL SUPPLY — 25 items

## 2019-07-27 NOTE — Anesthesia Preprocedure Evaluation (Signed)
Anesthesia Evaluation  Patient identified by MRN, date of birth, ID band Patient awake    Reviewed: Allergy & Precautions, NPO status , Patient's Chart, lab work & pertinent test results  Airway Mallampati: I  TM Distance: >3 FB Neck ROM: Full    Dental   Pulmonary Current Smoker,    Pulmonary exam normal        Cardiovascular Normal cardiovascular exam     Neuro/Psych    GI/Hepatic (+) Cirrhosis       ,   Endo/Other  diabetes, Type 2  Renal/GU      Musculoskeletal   Abdominal   Peds  Hematology   Anesthesia Other Findings   Reproductive/Obstetrics                             Anesthesia Physical Anesthesia Plan  ASA: III  Anesthesia Plan: MAC   Post-op Pain Management:    Induction: Intravenous  PONV Risk Score and Plan: 1 and Treatment may vary due to age or medical condition  Airway Management Planned: Nasal Cannula  Additional Equipment:   Intra-op Plan:   Post-operative Plan:   Informed Consent: I have reviewed the patients History and Physical, chart, labs and discussed the procedure including the risks, benefits and alternatives for the proposed anesthesia with the patient or authorized representative who has indicated his/her understanding and acceptance.       Plan Discussed with: CRNA and Surgeon  Anesthesia Plan Comments:         Anesthesia Quick Evaluation

## 2019-07-27 NOTE — Op Note (Signed)
[facility] Patient Name: Christine Barton Procedure Date: 07/27/2019 MRN: [id number] Attending MD: Carlota Raspberry. Havery Moros , MD Date of Birth: [birth year] CSN: [id number] Age: 69 Admit Type: Inpatient Procedure:                Upper GI endoscopy Indications:              Iron deficiency anemia, history of cirrhosis - no                            overt bleeding Providers:                Remo Lipps P. Havery Moros, MD, Elmer Ramp. Tilden Dome, RN,                            William Dalton, Technician Referring MD:              Medicines:                Monitored Anesthesia Care Complications:            No immediate complications. Estimated blood loss:                            Minimal. Estimated Blood Loss:     Estimated blood loss was minimal. Procedure:                Pre-Anesthesia Assessment:                           - Prior to the procedure, a History and Physical                            was performed, and patient medications and                            allergies were reviewed. The patient's tolerance of                            previous anesthesia was also reviewed. The risks                            and benefits of the procedure and the sedation                            options and risks were discussed with the patient.                            All questions were answered, and informed consent                            was obtained. Prior Anticoagulants: The patient has                            taken no previous anticoagulant or antiplatelet                            agents.  ASA Grade Assessment: III - A patient with                            severe systemic disease. After reviewing the risks                            and benefits, the patient was deemed in                            satisfactory condition to undergo the procedure.                           After obtaining informed consent, the endoscope was                            passed under direct  vision. Throughout the                            procedure, the patient's blood pressure, pulse, and                            oxygen saturations were monitored continuously. The                            [id number]-[id number] ([id number]) was introduced through the                            mouth, and advanced to the second part of duodenum.                            The upper GI endoscopy was accomplished without                            difficulty. The patient tolerated the procedure                            well. Scope In: Scope Out: Findings:      Esophagogastric landmarks were identified: the Z-line was found at 40       cm, the gastroesophageal junction was found at 40 cm and the upper       extent of the gastric folds was found at 40 cm from the incisors.      Small varices were found in the middle third of the esophagus and in the       lower third of the esophagus without any stigmata for bleeding. A small       erythematous spot was noted just proximal to the GEJ upon withdrawal,       likely due to endoscopic trauma, not seen during intubation.      The exam of the esophagus was otherwise normal.      Diffuse mild portal hypertensive gastritis was found in the entire       examined stomach, with some mild friability.      The exam of the stomach was otherwise normal. No gastric varices      The duodenal bulb and second portion  of the duodenum were normal. The       duodenal sweep was angulated and difficult to visualize, multiple passes       made. No obvious lesions noted. Impression:               - Esophagogastric landmarks identified.                           - Small esophageal varices with no stigmata for                            bleeding.                           - Portal hypertensive gastritis.                           - Normal duodenal bulb and second portion of the                            duodenum.                           Suspect iron deficiency could be due to  portal                            hypertensive gastritis + possible component of                            colonic AVMs Moderate Sedation:      No moderate sedation, case performed with MAC Recommendation:           - Return patient to hospital ward for ongoing care.                           - Resume previous diet.                           - Continue present medications.                           - continue protonix and Nadolol - consideration for                            increasing dose of nadolol if the patient can                            tolerate it (unclear if she has failed 16m dosing)                           - Full recommendations per colonoscopy note Procedure Code(s):        --- Professional ---                           [id number] Esophagogastroduodenoscopy, flexible,  transoral; diagnostic, including collection of                            specimen(s) by brushing or washing, when performed                            (separate procedure) Diagnosis Code(s):        --- Professional ---                           I85.00, Esophageal varices without bleeding                           K29.70, Gastritis, unspecified, without bleeding                           D50.9, Iron deficiency anemia, unspecified CPT copyright 2019 American Medical Association. All rights reserved. The codes documented in this report are preliminary and upon coder review may  be revised to meet current compliance requirements. Remo Lipps P. Wyndell Cardiff, MD 07/27/2019 9:03:06 AM This report has been signed electronically. Number of Addenda: 0

## 2019-07-27 NOTE — Interval H&P Note (Signed)
History and Physical Interval Note:  07/27/2019 7:26 AM  Christine Barton  has presented today for surgery, with the diagnosis of iron deficiency anemia.  The various methods of treatment have been discussed with the patient and family. After consideration of risks, benefits and other options for treatment, the patient has consented to  Procedure(s): COLONOSCOPY WITH PROPOFOL (N/A) ESOPHAGOGASTRODUODENOSCOPY (EGD) WITH PROPOFOL (N/A) as a surgical intervention.  The patient's history has been reviewed, patient examined, no change in status, stable for surgery.  I have reviewed the patient's chart and labs.  Questions were answered to the patient's satisfaction.     [place]

## 2019-07-27 NOTE — Discharge Summary (Signed)
Physician Discharge Summary  Christine Barton [name] DOB: 03-Sep-1950 DOA: 07/25/2019  PCP: [name], MD  Admit date: 07/25/2019 Discharge date: 07/27/2019  Time spent: 50 minutes  Recommendations for Outpatient Follow-up:  1. Patient left prior to being discharged and hopefully will follow up with GI in the outpatient setting.   Discharge Diagnoses:  Active Problems:   Hyperlipidemia associated with type 2 diabetes mellitus ([place])   History of alcohol abuse   Controlled diabetes mellitus type 2 with complications (HCC)   Cirrhosis of liver (HCC)   Anemia   Esophageal varices without bleeding (HCC)   Malnutrition (HCC)   Symptomatic anemia   Tobacco abuse   NASH (nonalcoholic steatohepatitis)   Protein-calorie malnutrition, severe   AVM (arteriovenous malformation) of colon   Benign neoplasm of colon   Discharge Condition: Patient left prior to being discharged.  Diet recommendation: Patient left prior to being discharged.  Filed Weights   07/25/19 1240 07/25/19 2228 07/27/19 0648  Weight: 47.2 kg 46.9 kg 46.6 kg    History of present illness:  HPI per Dr. Marlowe Barton is a 69 y.o. female with medical history significant of hx of cirrhosis due to EtOH and NASH, history of alcohol abuse, history of breast cancer, DM2, HLD    Presented with abnormal lab results from GI office seen earlier today by Dr. [name] and routine labs notable for significant microcytic anemia, with H/H 6.5/21.3, MCV 64.9 (was 12/35.7 in 02/2019 with MCV 89). Dr. [name] had left message for patient with instructions to go to [facility] ER for eval and transfusion. Pt denies any melena no blood in stool, reports decreased po intake and weight loss over 20 lb for the past few months. She reports feeling fatigued and lightheaded No sick contacts, no fever.  Denies EtOH not the past 1 year Smokes 1 pack a day   Infectious risk factors:  Reports  severe fatigue    Has   been  vaccinated against COVID x2 06/16/2019  in house  PCR testing  Pending  Recent Labs       Lab Results  Component Value Date   [place] Not Detected 05/01/2019   [place] NEGATIVE 03/09/2019   [place] NEGATIVE 10/27/2018        Regarding pertinent Chronic problems:    Cirrhosis on Xifaxan and lactulose, 20 mg of Lasix 25 mg of spironolactone and 20 mg of nadolol daily Known history of grade 1 esophageal varices on endoscopy November 2020 Last colonoscopy was August 2018 showed diverticulosis and sessile polyps internal hemorrhoids   Hyperlipidemia - on statins Crestor      DM 2 -  Recent Labs  Lab Results  Component Value Date   HGBA1C 6.7 (A) 02/07/2019     on  PO meds only, diet controlled      Asthma -well   controlled on home inhalers      Hx of [place] in 1998 - sp chemo radiation therapy and masectomy   While in ER: Noted to be anemic down to 6.5 Anemia panel ordered showed evidence of iron deficiency Type and screen for blood transfusion Transfused 1 unit Initially hypoglycemic down to 65 improved after drinking apple juice  ER Provider Called: [facility]   Dr. Fuller Plan They Recommend admit to medicine  Will see in AM   Hospitalist was called for admission for symptomatic anemia   Hospital Course:  1 severe iron deficiency anemia/symptomatic anemia Patient presented secondary to anemia noted on lab  work with a hemoglobin of 6.5 with some associated fatigue and lightheadedness.  Patient transfused a unit of packed red blood cells in the ED.  Patient denied any overt bleeding.  FOBT negative.  Hemoglobin on admission was 6.5, patient transfused 1 unit of packed red blood cells with hemoglobin going up to 7.6 and subsequently down to 6.6.  Patient subsequently transfused 2 more units of packed red blood cell with hemoglobin stabilizing at 8.2 by day of discharge.  Anemia panel consistent with a severe iron deficiency anemia with a iron level of  10, ferritin of 5, TIBC of 648, folate of 66.7.  Vitamin B12 level of 726.  Patient seen in consultation by gastroenterology and patient subsequently underwent upper endoscopy and colonoscopy on 07/27/2019.  Upper endoscopy showed small esophageal varices without any bleeding, portal hypertensive gastritis, normal duodenal bulb and second portion of the duodenum.  Colonoscopy showed showed 8 colonic angiodysplastic lesion status post APC, 3 mm polyp in the cecum status post resection and retrieval, 3 mm polyp in the hepatic flexure status post resection and retrieval, diverticulosis, 4 - 5 mm polyp in the sigmoid colon status post resection and retrieval, internal hemorrhoids.  Patient's hemoglobin remained stable.  Patient received a dose of IV Feraheme during the hospitalization.  Patient however left prior to being formally discharged.   2.  History of adenomatous colonic polyps Patient underwent colonoscopy on 07/27/2019 which showed 8 colonic angiodysplastic lesion status post APC, 3 mm polyp in the cecum status post resection and retrieval, 3 mm polyp in the hepatic flexure status post resection and retrieval, diverticulosis, 4 - 5 mm polyp in the sigmoid colon status post resection and retrieval, internal hemorrhoids.  Outpatient follow-up with GI.  3.  Hyperlipidemia Patient maintained on statin.    4.  History of alcohol abuse Currently in remission.  Patient with no signs of alcohol withdrawal.    5.  Cirrhosis of the liver/NASH/history of alcohol use Patient noted to have some small varices with portal hypertensive gastropathy.  Patient with history of hepatic encephalopathy.  Patient during the hospitalization with no signs of decompensation.  Patient was maintained on home regimen of lactulose, Xifaxan, Aldactone, lasix.  Outpatient follow-up with GI.  6.  Asthma/copd Patient states some intermittent chronic wheezing and cough likely COPD component.  Patient was on albuterol nebs as  needed as well as scheduled duo nebs.  Tobacco cessation stressed to patient.  Chest x-ray done showed an area of nonacute area of fibrosis noted on prior CT felt to be nonmalignant.  Outpatient follow-up with PCP.  7.  Tobacco abuse Tobacco cessation.    Patient was placed on a nicotine patch.  8.  Well-controlled diabetes mellitus type 2 Hemoglobin A1c 6.7.    Patient's oral hypoglycemic agents were held during the hospitalization.  Patient maintained on sliding scale insulin.   9.  History of esophageal varices without bleeding Patient being followed by GI.  Patient maintained on home regimen nadolol.    Patient underwent upper endoscopy for 16 2021 which showed small esophageal varices without any bleeding, portal hypertensive gastritis, normal duodenal bulb and second portion of the duodenum.  Outpatient follow-up with GI.  10.  Hypomagnesemia On day of discharge IV magnesium was ordered for patient.  IV magnesium was started however patient was complaining of burning in her arm and IV magnesium subsequently discontinued.  Patient noted to be upset and subsequently left without being formally discharged.  Hopefully patient will follow up with PCP  in the outpatient setting.   Procedures:  Chest x-ray 07/25/2019  Upper endoscopy/colonoscopy 07/27/2019  Consultations:  Gastroenterology: Dr. Rush Landmark 07/26/2019  Discharge Exam: Vitals:   07/27/19 0941 07/27/19 1000  BP: 134/71 136/70  Pulse: 74 71  Resp: 16   Temp:  (!) 97.4 F (36.3 C)  SpO2: 100% 100%    General: NAD Cardiovascular: RRR Respiratory: CTAB  Discharge Instructions Patient left prior to discharge order being placed.    Allergies  Allergen Reactions  . Oxycodone Nausea And Vomiting  . Glipizide Other (See Comments)    Stomach pain      The results of significant diagnostics from this hospitalization (including imaging, microbiology, ancillary and laboratory) are listed below for reference.     Significant Diagnostic Studies: DG Chest 2 View  Result Date: 07/25/2019 CLINICAL DATA:  Low hemoglobin EXAM: CHEST - 2 VIEW COMPARISON:  04/18/2018, CT 04/19/2018 FINDINGS: Mild reticular opacity on the right likely due to fibrosis demonstrated on prior CT. Mild asymmetric opacity at the right apex, likely corresponds to the CT demonstrated masslike fibrosis. Stable cardiomediastinal silhouette. No pneumothorax. Clips over the right axilla. IMPRESSION: No active cardiopulmonary disease. Asymmetric right apical opacity presumably corresponds to the CT area of masslike fibrosis. Electronically Signed   By: Donavan Foil M.D.   On: 07/25/2019 21:16    Microbiology: Recent Results (from the past 240 hour(s))  SARS CORONAVIRUS 2 (TAT 6-24 HRS) Nasopharyngeal Nasopharyngeal Swab     Status: None   Collection Time: 07/25/19  7:58 PM   Specimen: Nasopharyngeal Swab  Result Value Ref Range Status   SARS Coronavirus 2 NEGATIVE NEGATIVE Final    Comment: (NOTE) SARS-CoV-2 target nucleic acids are NOT DETECTED. The SARS-CoV-2 RNA is generally detectable in upper and lower respiratory specimens during the acute phase of infection. Negative results do not preclude SARS-CoV-2 infection, do not rule out co-infections with other pathogens, and should not be used as the sole basis for treatment or other patient management decisions. Negative results must be combined with clinical observations, patient history, and epidemiological information. The expected result is Negative. Fact Sheet for Patients: SugarRoll.be Fact Sheet for Healthcare Providers: https://www.woods-mathews.com/ This test is not yet approved or cleared by the [place] FDA and  has been authorized for detection and/or diagnosis of SARS-CoV-2 by FDA under an Emergency Use Authorization (EUA). This EUA will remain  in effect (meaning this test can be used) for the duration of the COVID-19  declaration under Section 56 4(b)(1) of the Act, 21 U.S.C. section 360bbb-3(b)(1), unless the authorization is terminated or revoked sooner. Performed at [facility], [place] [address] [place], [place] [address]      Labs: Basic Metabolic Panel: Recent Labs  Lab 07/24/19 1422 07/25/19 1450 07/25/19 1640 07/26/19 0421 07/27/19 0129  NA 135 139  --  138 134*  K 4.3 4.0  --  3.8 3.8  CL 103 105  --  105 105  CO2 25 25  --  24 20*  GLUCOSE 168* 117*  --  109* 258*  BUN 25* 27*  --  22 10  CREATININE 0.73 0.75  --  0.65 0.62  CALCIUM 9.8 9.6  --  9.4 9.0  MG  --   --  1.4* 1.8 1.5*  PHOS  --   --  3.9 3.5  --    Liver Function Tests: Recent Labs  Lab 07/24/19 1422 07/25/19 1450 07/26/19 0421  AST 18 25 22   ALT 8 12 10  ALKPHOS 116 93 83  BILITOT 0.5 0.7 0.7  PROT 7.3 7.6 6.5  ALBUMIN 4.2 4.0 3.5   No results for input(s): LIPASE, AMYLASE in the last 168 hours. Recent Labs  Lab 07/25/19 1640  AMMONIA 17   CBC: Recent Labs  Lab 07/24/19 1422 07/25/19 1450 07/25/19 2341 07/26/19 0421 07/26/19 1444 07/26/19 2154 07/27/19 0129  WBC 8.3   < > 7.6 6.1 6.0 7.8 6.5  NEUTROABS 5.2  --   --  3.2  --   --   --   HGB 6.5 Repeated and verified X2.*   < > 7.6* 6.6* 8.1* 8.8* 8.2*  HCT 21.3 Repeated and verified X2.*   < > 27.3* 22.7* 27.7* 29.8* 28.1*  MCV 64.9 Repeated and verified X2.*   < > 74.2* 72.3* 74.9* 74.3* 75.3*  PLT 231.0   < > 210 175 168 176 151   < > = values in this interval not displayed.   Cardiac Enzymes: No results for input(s): CKTOTAL, CKMB, CKMBINDEX, TROPONINI in the last 168 hours. BNP: BNP (last 3 results) No results for input(s): BNP in the last 8760 hours.  ProBNP (last 3 results) No results for input(s): PROBNP in the last 8760 hours.  CBG: Recent Labs  Lab 07/26/19 2049 07/27/19 0007 07/27/19 0406 07/27/19 0658 07/27/19 0958  GLUCAP 89 178* 105* 102* 126*       Signed:  Irine Seal MD.  [facility] 07/27/2019, 1:37 PM

## 2019-07-27 NOTE — Op Note (Signed)
[facility] Patient Name: Christine Barton Procedure Date: 07/27/2019 MRN: [id number] Attending MD: Carlota Raspberry. Havery Moros , MD Date of Birth: 02/10/1951 CSN: [id number] Age: 69 Admit Type: Inpatient Procedure:                Colonoscopy Indications:              Iron deficiency anemia, history of cirrhosis Providers:                Remo Lipps P. Havery Moros, MD, Tory Emerald, RN,                            William Dalton, Technician Referring MD:              Medicines:                Monitored Anesthesia Care Complications:            No immediate complications. Estimated blood loss:                            Minimal. Estimated Blood Loss:     Estimated blood loss was minimal. Procedure:                Pre-Anesthesia Assessment:                           - Prior to the procedure, a History and Physical                            was performed, and patient medications and                            allergies were reviewed. The patient's tolerance of                            previous anesthesia was also reviewed. The risks                            and benefits of the procedure and the sedation                            options and risks were discussed with the patient.                            All questions were answered, and informed consent                            was obtained. Prior Anticoagulants: The patient has                            taken no previous anticoagulant or antiplatelet                            agents. ASA Grade Assessment: III - A patient with  severe systemic disease. After reviewing the risks                            and benefits, the patient was deemed in                            satisfactory condition to undergo the procedure.                           After obtaining informed consent, the colonoscope                            was passed under direct vision. Throughout the                             procedure, the patient's blood pressure, pulse, and                            oxygen saturations were monitored continuously. The                            PCF-H190DL ([id number] Olympus pediatric colonscope                            was introduced through the anus and advanced to the                            the cecum, identified by appendiceal orifice and                            ileocecal valve. The colonoscopy was performed                            without difficulty. The patient tolerated the                            procedure well. The quality of the bowel                            preparation was adequate. The ileocecal valve,                            appendiceal orifice, and rectum were photographed. Scope In: 7:48:03 AM Scope Out: 8:32:21 AM Scope Withdrawal Time: 0 hours 34 minutes 44 seconds  Total Procedure Duration: 0 hours 44 minutes 18 seconds  Findings:      The perianal and digital rectal examinations were normal.      A moderate amount of liquid stool was found in the ascending colon and       in the cecum, making visualization difficult. Several minutes were spent       lavaging this part of the colon using copious amounts of sterile water,       resulting in clearance with good visualization.      Two small angiodysplastic lesions were found in the cecum. Fulguration  to ablate the lesion to prevent bleeding by argon plasma was successful.      A 3 mm polyp was found in the cecum. The polyp was sessile. The polyp       was removed with a cold biopsy forceps. Resection and retrieval were       complete.      Two medium-sized (7-27m) angiodysplastic lesions were found in the       ascending colon. Fulguration to ablate the lesion to prevent bleeding by       argon plasma was successful.      A 3 mm polyp was found in the hepatic flexure. The polyp was sessile.       The polyp was removed with a cold biopsy forceps. Resection and       retrieval were  complete.      Two small angiodysplastic lesions were found at the hepatic flexure.       Fulguration to ablate the lesion to prevent bleeding by argon plasma was       successful.      A single small angiodysplastic lesion was found at the splenic flexure.       Fulguration to ablate the lesion to prevent bleeding by argon plasma was       successful.      A single small angiodysplastic lesion was found in the descending colon.       Fulguration to ablate the lesion to prevent bleeding by argon plasma was       successful.      Multiple medium-mouthed diverticula were found in the sigmoid colon,       with a severely angulated turn / luminal narrowing which took some time       to traverse.      A 4 to 5 mm polyp was found in the sigmoid colon. The polyp was sessile.       The polyp was removed with a cold snare. Resection and retrieval were       complete.      Internal hemorrhoids were found.      The colon was edematous in some areas with contact friability, I suspect       due to portal hypertension. The exam was otherwise without abnormality.       Retroflexed views not obtained due to small size of the rectum Impression:               - Brown stool in the ascending colon and in the                            cecum which was lavaged with adequate clearance. No                            heme or blood noted anywhere.                           - 8 colonic angiodysplastic lesions as outlined. No                            stigmata of bleeding noted but treated with argon                            plasma coagulation (  APC) in case contributing to                            iron deficiency.                           - One 3 mm polyp in the cecum, removed with a cold                            biopsy forceps. Resected and retrieved.                           - One 3 mm polyp at the hepatic flexure, removed                            with a cold biopsy forceps. Resected and retrieved.                            - Diverticulosis in the sigmoid colon with severaly                            angulated lumen.                           - One 4 to 5 mm polyp in the sigmoid colon, removed                            with a cold snare. Resected and retrieved.                           - Internal hemorrhoids.                           - Congestive changes in the colon in some areas                            with contact friability                           - The examination was otherwise normal.                           Overall, suspect portal hypertensive gastritis +                            component of colonic AVMs could be causing iron                            deficiency. Moderate Sedation:      No moderate sedation, case performed with MAC Recommendation:           - Return patient to hospital ward for ongoing care.                           - Advance diet as tolerated.                           -  Continue present medications.                           - Await pathology results.                           - IV iron infusion if not yet given and continue as                            outpatient                           - I think okay to go home later today or tomorrow                            if stable, our office will follow up with the                            patient and perform CBC next week                           - If the patient can tolerate higher dosing of                            nadalol (on 13m / day now), would recommend it                            (unclear if she has had tolerance issues with 461m                           dosing in the past                           - GI service will sign off at this time please                            contact with questions moving forward Procedure Code(s):        --- Professional ---                           [phone], Colonoscopy, flexible; with control of                            bleeding, any  method                           45385, Colonoscopy, flexible; with removal of                            tumor(s), polyp(s), or other lesion(s) by snare                            technique  74944, 51, Colonoscopy, flexible; with biopsy,                            single or multiple Diagnosis Code(s):        --- Professional ---                           K64.8, Other hemorrhoids                           K55.20, Angiodysplasia of colon without hemorrhage                           K63.5, Polyp of colon                           D50.9, Iron deficiency anemia, unspecified CPT copyright 2019 [employer]. All rights reserved. The codes documented in this report are preliminary and upon coder review may  be revised to meet current compliance requirements. Remo Lipps P. Christine Foulk, MD 07/27/2019 8:55:42 AM This report has been signed electronically. Number of Addenda: 0

## 2019-07-27 NOTE — Progress Notes (Addendum)
Patient returned from endoscopy, A&Ox4. Out of bed 1 assist. Pt has no complaints at this time. Will update primary RN and continue to monitor.

## 2019-07-27 NOTE — Transfer of Care (Signed)
Immediate Anesthesia Transfer of Care Note  Patient: Christine Barton  Procedure(s) Performed: Procedure(s): COLONOSCOPY WITH PROPOFOL (N/A) ESOPHAGOGASTRODUODENOSCOPY (EGD) WITH PROPOFOL (N/A) POLYPECTOMY HOT HEMOSTASIS (ARGON PLASMA COAGULATION/BICAP) (N/A)  Patient Location: PACU and Endoscopy Unit  Anesthesia Type:MAC  Level of Consciousness: awake, alert  and oriented  Airway & Oxygen Therapy: Patient Spontanous Breathing and Patient connected to nasal cannula oxygen  Post-op Assessment: Report given to RN and Post -op Vital signs reviewed and stable  Post vital signs: Reviewed and stable  Last Vitals:  Vitals:   07/27/19 0513 07/27/19 0648  BP: 123/68 135/69  Pulse: 90 83  Resp: 18 15  Temp: 37 C 37.2 C  SpO2: 33% 82%    Complications: No apparent anesthesia complications

## 2019-07-27 NOTE — Progress Notes (Signed)
Patient exited room with belongings in hand, asking where the elevators were. Nurse attempted to explain that patient wasn't discharged yet, and patient did not respond. Risks of leaving before discharge and benefits of staying until discharge were explained. Patient alert, oriented and able to make decisions. Patient refused to sign AMA papers, and left. MD was notified.

## 2019-07-27 NOTE — Anesthesia Postprocedure Evaluation (Signed)
Anesthesia Post Note  Patient: Christine Barton  Procedure(s) Performed: COLONOSCOPY WITH PROPOFOL (N/A ) ESOPHAGOGASTRODUODENOSCOPY (EGD) WITH PROPOFOL (N/A ) POLYPECTOMY HOT HEMOSTASIS (ARGON PLASMA COAGULATION/BICAP) (N/A )     Patient location during evaluation: PACU Anesthesia Type: MAC Level of consciousness: awake and alert Pain management: pain level controlled Vital Signs Assessment: post-procedure vital signs reviewed and stable Respiratory status: spontaneous breathing, nonlabored ventilation, respiratory function stable and patient connected to nasal cannula oxygen Cardiovascular status: stable and blood pressure returned to baseline Postop Assessment: no apparent nausea or vomiting Anesthetic complications: no    Last Vitals:  Vitals:   07/27/19 0941 07/27/19 1000  BP: 134/71 136/70  Pulse: 74 71  Resp: 16   Temp:  (!) 36.3 C  SpO2: 100% 100%    Last Pain:  Vitals:   07/27/19 1000  TempSrc: Oral  PainSc:                  [name] DAVID

## 2019-07-27 NOTE — Progress Notes (Signed)
Patient complained of burning at IV site. Magnesium infusing. Provider at bedside. V.O received to discontinue IV access and  Telemetry. Will apply ice pack to assist with comfort.

## 2019-07-30 ENCOUNTER — Telehealth: Payer: Self-pay

## 2019-07-30 ENCOUNTER — Encounter: Payer: Self-pay | Admitting: *Deleted

## 2019-07-30 LAB — SURGICAL PATHOLOGY

## 2019-07-30 MED ORDER — NYSTATIN 100000 UNIT/ML MT SUSP: 5.0000 mL | Freq: Three times a day (TID) | OROMUCOSAL | 0 refills | Status: DC

## 2019-07-30 NOTE — Telephone Encounter (Signed)
Spoke with the patient. She is aware of getting labs this week. This has been set up by her PCP. She will also see PCP on 08/08/19. She is scheduled for follow up with Dr [name] on 08/21/19. Prior to her most recent hospitalization she had been scheduled for follow up with Dr [name] on 10/01/19. I will leave this appointment scheduled and Dr [name] will determine if it is to be cancelled.

## 2019-07-30 NOTE — Telephone Encounter (Signed)
Pt notified Rx sent 

## 2019-07-30 NOTE — Telephone Encounter (Signed)
-----   Message from Yetta Flock, MD sent at 07/27/2019  9:12 AM EDT ----- Regarding: outpatient follow up This is an inpatient of [facility] who will need a CBC next week to ensure stable Hgb and coordinate of another dose of IV iron in the next few weeks. She should probably also have outpatient follow up with APP in 3-4 weeks if possible. She should be discharged later today. Thanks

## 2019-07-30 NOTE — Telephone Encounter (Signed)
I spoke with pt; pt said her mouth is burning like it is on fire. Pt has hx of thrush and said that is what her mouth feels like now; pts tongue is red and raw. Pt is requesting refill on nystatin to [facility]. Last nystatin susp refill was # 120 ml x 0 on 12/22/2018. Pt request cb when refilled.

## 2019-07-30 NOTE — Telephone Encounter (Signed)
I sent it  

## 2019-07-30 NOTE — Telephone Encounter (Signed)
Order for [facility] infusion faxed to [facility]  Fax [phone] phone [phone] Notes, insurance card, and labs with the order faxed also.

## 2019-07-31 ENCOUNTER — Other Ambulatory Visit: Payer: Self-pay

## 2019-07-31 ENCOUNTER — Other Ambulatory Visit (INDEPENDENT_AMBULATORY_CARE_PROVIDER_SITE_OTHER): Payer: Medicare HMO

## 2019-07-31 DIAGNOSIS — E785 Hyperlipidemia, unspecified: Secondary | ICD-10-CM

## 2019-07-31 DIAGNOSIS — E1169 Type 2 diabetes mellitus with other specified complication: Secondary | ICD-10-CM | POA: Diagnosis not present

## 2019-07-31 DIAGNOSIS — E118 Type 2 diabetes mellitus with unspecified complications: Secondary | ICD-10-CM

## 2019-07-31 DIAGNOSIS — D500 Iron deficiency anemia secondary to blood loss (chronic): Secondary | ICD-10-CM | POA: Diagnosis not present

## 2019-07-31 LAB — CBC WITH DIFFERENTIAL/PLATELET
Basophils Absolute: 0.1 10*3/uL (ref 0.0–0.1)
Basophils Relative: 1 % (ref 0.0–3.0)
Eosinophils Absolute: 0.3 10*3/uL (ref 0.0–0.7)
Eosinophils Relative: 3 % (ref 0.0–5.0)
HCT: 30.7 % — ABNORMAL LOW (ref 36.0–46.0)
Hemoglobin: 9.6 g/dL — ABNORMAL LOW (ref 12.0–15.0)
Lymphocytes Relative: 22 % (ref 12.0–46.0)
Lymphs Abs: 1.9 10*3/uL (ref 0.7–4.0)
MCHC: 31.4 g/dL (ref 30.0–36.0)
MCV: 73.3 fl — ABNORMAL LOW (ref 78.0–100.0)
Monocytes Absolute: 0.7 10*3/uL (ref 0.1–1.0)
Monocytes Relative: 8.8 % (ref 3.0–12.0)
Neutro Abs: 5.5 10*3/uL (ref 1.4–7.7)
Neutrophils Relative %: 65.2 % (ref 43.0–77.0)
Platelets: 152 10*3/uL (ref 150.0–400.0)
RBC: 4.18 Mil/uL (ref 3.87–5.11)
RDW: 24.6 % — ABNORMAL HIGH (ref 11.5–15.5)
WBC: 8.4 10*3/uL (ref 4.0–10.5)

## 2019-07-31 LAB — COMPREHENSIVE METABOLIC PANEL
ALT: 10 U/L (ref 0–35)
AST: 22 U/L (ref 0–37)
Albumin: 4 g/dL (ref 3.5–5.2)
Alkaline Phosphatase: 108 U/L (ref 39–117)
BUN: 12 mg/dL (ref 6–23)
CO2: 29 mEq/L (ref 19–32)
Calcium: 9.6 mg/dL (ref 8.4–10.5)
Chloride: 103 mEq/L (ref 96–112)
Creatinine, Ser: 0.62 mg/dL (ref 0.40–1.20)
GFR: 95.55 mL/min (ref >60.00)
Glucose, Bld: 119 mg/dL — ABNORMAL HIGH (ref 70–99)
Potassium: 4.3 mEq/L (ref 3.5–5.1)
Sodium: 138 mEq/L (ref 135–145)
Total Bilirubin: 0.7 mg/dL (ref 0.2–1.2)
Total Protein: 6.7 g/dL (ref 6.0–8.3)

## 2019-07-31 LAB — FERRITIN: Ferritin: 231.5 ng/mL (ref 10.0–291.0)

## 2019-07-31 LAB — LIPID PANEL
Cholesterol: 99 mg/dL (ref 0–200)
HDL: 37.6 mg/dL — ABNORMAL LOW (ref >39.00)
LDL Cholesterol: 41 mg/dL (ref 0–99)
NonHDL: 60.9
Total CHOL/HDL Ratio: 3
Triglycerides: 98 mg/dL (ref 0.0–149.0)
VLDL: 19.6 mg/dL (ref 0.0–40.0)

## 2019-07-31 LAB — HEMOGLOBIN A1C: Hgb A1c MFr Bld: 6.5 % (ref 4.6–6.5)

## 2019-08-01 ENCOUNTER — Other Ambulatory Visit: Payer: Medicare HMO

## 2019-08-02 ENCOUNTER — Telehealth: Payer: Self-pay | Admitting: Gastroenterology

## 2019-08-02 NOTE — Telephone Encounter (Signed)
EXT. Lynndyl from [facility] is calling- she states that they want to know if patient has tried oral iron infusion? If so they need documentation on a note and sent over so the insurance will approve it. States you can call back and ask for her.

## 2019-08-02 NOTE — Telephone Encounter (Signed)
More records faxed. Supporting documentation from July 2020 showing where despite taking oral iron, which she apparently did not tolerate well, she had a drop in her hemoglobin, was hospitalized and received iron infusion as well as blood transfusion.

## 2019-08-05 ENCOUNTER — Other Ambulatory Visit: Payer: Self-pay | Admitting: Gastroenterology

## 2019-08-06 ENCOUNTER — Telehealth: Payer: Self-pay | Admitting: Gastroenterology

## 2019-08-06 NOTE — Telephone Encounter (Signed)
[facility] needed notes for [employer] supporting ineffectiveness of oral iron supplementation. This was faxed on 08/02/19. Explained to the patient that there has been some "back and forth" with the insurance. She states understanding.

## 2019-08-07 ENCOUNTER — Other Ambulatory Visit: Payer: Self-pay

## 2019-08-07 ENCOUNTER — Telehealth: Payer: Self-pay

## 2019-08-07 DIAGNOSIS — Z8774 Personal history of (corrected) congenital malformations of heart and circulatory system: Secondary | ICD-10-CM

## 2019-08-07 DIAGNOSIS — D649 Anemia, unspecified: Secondary | ICD-10-CM

## 2019-08-07 DIAGNOSIS — D508 Other iron deficiency anemias: Secondary | ICD-10-CM

## 2019-08-07 DIAGNOSIS — D509 Iron deficiency anemia, unspecified: Secondary | ICD-10-CM

## 2019-08-07 DIAGNOSIS — D500 Iron deficiency anemia secondary to blood loss (chronic): Secondary | ICD-10-CM

## 2019-08-07 NOTE — Telephone Encounter (Signed)
[facility] infusion will be unaffordable for the patient. Canceled this order/referral. Patient advised we will refer her to Hematology for evaluation of her iron deficient anemia.

## 2019-08-08 ENCOUNTER — Other Ambulatory Visit: Payer: Self-pay

## 2019-08-08 ENCOUNTER — Encounter: Payer: Self-pay | Admitting: Family Medicine

## 2019-08-08 ENCOUNTER — Ambulatory Visit (INDEPENDENT_AMBULATORY_CARE_PROVIDER_SITE_OTHER): Payer: Medicare HMO | Admitting: Family Medicine

## 2019-08-08 VITALS — BP 120/68 | HR 78 | Temp 98.5°F | Ht 65.0 in | Wt 100.4 lb

## 2019-08-08 DIAGNOSIS — E46 Unspecified protein-calorie malnutrition: Secondary | ICD-10-CM

## 2019-08-08 DIAGNOSIS — E1169 Type 2 diabetes mellitus with other specified complication: Secondary | ICD-10-CM

## 2019-08-08 DIAGNOSIS — Z720 Tobacco use: Secondary | ICD-10-CM

## 2019-08-08 DIAGNOSIS — I8510 Secondary esophageal varices without bleeding: Secondary | ICD-10-CM | POA: Diagnosis not present

## 2019-08-08 DIAGNOSIS — D500 Iron deficiency anemia secondary to blood loss (chronic): Secondary | ICD-10-CM | POA: Diagnosis not present

## 2019-08-08 DIAGNOSIS — I700 Atherosclerosis of aorta: Secondary | ICD-10-CM | POA: Diagnosis not present

## 2019-08-08 DIAGNOSIS — E118 Type 2 diabetes mellitus with unspecified complications: Secondary | ICD-10-CM | POA: Diagnosis not present

## 2019-08-08 DIAGNOSIS — R1084 Generalized abdominal pain: Secondary | ICD-10-CM

## 2019-08-08 DIAGNOSIS — E43 Unspecified severe protein-calorie malnutrition: Secondary | ICD-10-CM | POA: Diagnosis not present

## 2019-08-08 DIAGNOSIS — K5520 Angiodysplasia of colon without hemorrhage: Secondary | ICD-10-CM | POA: Diagnosis not present

## 2019-08-08 DIAGNOSIS — K7030 Alcoholic cirrhosis of liver without ascites: Secondary | ICD-10-CM

## 2019-08-08 DIAGNOSIS — R109 Unspecified abdominal pain: Secondary | ICD-10-CM | POA: Insufficient documentation

## 2019-08-08 DIAGNOSIS — E785 Hyperlipidemia, unspecified: Secondary | ICD-10-CM

## 2019-08-08 NOTE — Assessment & Plan Note (Signed)
Due to past etoh abuse No longer drinking  Fairly stable LFTs  Recent hosp for severe anemia esoph varicies noted  Ascites in control with current medications Continues f/u with GI and hepatology

## 2019-08-08 NOTE — Assessment & Plan Note (Signed)
Disc in detail risks of smoking and possible outcomes including copd, vascular/ heart disease, cancer , respiratory and sinus infections  Pt voices understanding Pt has cut down to 1/2 ppd  Plans to quit with patch but unsure when  More copd symptoms lately

## 2019-08-08 NOTE — Assessment & Plan Note (Signed)
Lab Results  Component Value Date   HGBA1C 6.5 07/31/2019   In good control with metformin and diet  Needs more protein calories  Also in setting of cirrhosis

## 2019-08-08 NOTE — Assessment & Plan Note (Signed)
No clinical changes

## 2019-08-08 NOTE — Patient Instructions (Addendum)
Please let your GI doctor know about the abdominal pain with eating/drinking   HbA1C test is for diabetes   Cbc is for anemia - Hb is an index of blood count   Keep thinking about quitting smoking Use the patches when you are ready

## 2019-08-08 NOTE — Assessment & Plan Note (Signed)
No bleeding on last EGD

## 2019-08-08 NOTE — Assessment & Plan Note (Signed)
Vague abd pain with eating or drinking-since hosp for anemia Reviewed hospital records, lab results and studies in detail  Rev EGD and colonoscopy  Varices/and portal gastropathy noted Aware she has gallstones  Taking ppi Will f/u with GI and update if this becomes worse

## 2019-08-08 NOTE — Assessment & Plan Note (Signed)
Strongly enc more protein intake  Rev diet Poor appetite is multi factorial

## 2019-08-08 NOTE — Assessment & Plan Note (Signed)
Controlled with low dose crestor (stable LFTs) Disc goals for lipids and reasons to control them Rev last labs with pt Rev low sat fat diet in detail

## 2019-08-08 NOTE — Assessment & Plan Note (Signed)
Recent severe anemia (Hb of 6.5) with transfusions and EGD/Colonoscopy  In setting of cirrhosis  Reviewed hospital records, lab results and studies in detail   Varices noted-not bleeding at time of exam  Colonic avms tx and polyps removed GI f/u planned next month Continues pantoprazole  Hb up to 9.6- improved Unable to tolerate oral iron  GI provider is trying to get IV iron paid for by insurance

## 2019-08-08 NOTE — Progress Notes (Signed)
Subjective:    Patient ID: Christine Barton, female    DOB: Jan 10, 1951, 69 y.o.   MRN: [id number]  This visit occurred during the SARS-CoV-2 public health emergency.  Safety protocols were in place, including screening questions prior to the visit, additional usage of staff PPE, and extensive cleaning of exam room while observing appropriate contact time as indicated for disinfecting solutions.    HPI Pt presents for f/u of chronic medical problems   Wt Readings from Last 3 Encounters:  08/08/19 100 lb 6 oz (45.5 kg)  07/27/19 102 lb 11.8 oz (46.6 kg)  07/24/19 104 lb (47.2 kg)   16.70 kg/m She eats - (some abd pain issues)  Also had dental work done- that interfered  Not much appetite   High calorie drink and high protein drink daily  Not always 3 meals per day but she does snack  Eating healthy things -incl grilled chicken/fish and baked [place]   Also fruit and peanut butter   BP Readings from Last 3 Encounters:  08/08/19 120/68  07/27/19 136/70  07/24/19 (!) 116/50   Pulse Readings from Last 3 Encounters:  08/08/19 78  07/27/19 71  07/24/19 92      DM2 Lab Results  Component Value Date   HGBA1C 6.5 07/31/2019   Down from 6.7 Diet - healthy  Metformin xr 500 mg daily  Is now on low dose statin (cardiology)-watching lfts   Hyperlipidemia Lab Results  Component Value Date   CHOL 99 07/31/2019   CHOL 128 11/06/2018   CHOL 121 10/23/2018   Lab Results  Component Value Date   HDL 37.60 (L) 07/31/2019   HDL 32.60 (L) 11/06/2018   HDL 32.30 (L) 10/23/2018   Lab Results  Component Value Date   LDLCALC 41 07/31/2019   LDLCALC 70 11/06/2018   LDLCALC 69 10/23/2018   Lab Results  Component Value Date   TRIG 98.0 07/31/2019   TRIG 125.0 11/06/2018   TRIG 99.0 10/23/2018   Lab Results  Component Value Date   CHOLHDL 3 07/31/2019   CHOLHDL 4 11/06/2018   CHOLHDL 4 10/23/2018   Lab Results  Component Value Date   LDLDIRECT 83.0 10/31/2017   LDLDIRECT 145.1 10/10/2012   LDLDIRECT 151.4 07/21/2011  HDL is low   Anemia  Lab Results  Component Value Date   WBC 8.4 07/31/2019   HGB 9.6 Repeated and verified X2. (L) 07/31/2019   HCT 30.7 (L) 07/31/2019   MCV 73.3 (L) 07/31/2019   PLT 152.0 07/31/2019   Lab Results  Component Value Date   FERRITIN 231.5 07/31/2019   Seen by GI  Has had IV iron - she can not afford to get it at [facility]  Is working with the cancer center now    Was hospitalized 4/14-4/16  For symptomatic anemia with Hb of 6.5  Was transfused  EGD- non bleeding varices and portal hypertensive gastritis Colonoscopy -angiodysplastic lesions as well as polyps and diverticulosis  After becoming stable she left prior to being d/c GI f/u  Hb 8.5 at d/c  Mag was low and IV mag given but it was not tolerated and pt left (burning)  She has experienced abd pain since then  Happens with eating/drinking  Has appt with GI on 5/11    Cirrhosis Lab Results  Component Value Date   ALT 10 07/31/2019   AST 22 07/31/2019   ALKPHOS 108 07/31/2019   BILITOT 0.7 07/31/2019   taking xifaxan, lactulose, lasix and spironolactone Also  nadolol for grade 1 esoph varicies  Lab Results  Component Value Date   CREATININE 0.62 07/31/2019   BUN 12 07/31/2019   NA 138 07/31/2019   K 4.3 07/31/2019   CL 103 07/31/2019   CO2 29 07/31/2019   Smokes 1/2 ppd  Patch in the hospital helps More sob as time goes on and smokers cough  Will likely quit on Friday   Had her covid vaccines  Has PE planned in July here  Patient Active Problem List   Diagnosis Date Noted  . Abdominal pain 08/08/2019  . Protein-calorie malnutrition, severe 07/27/2019  . AVM (arteriovenous malformation) of colon   . Benign neoplasm of colon   . NASH (nonalcoholic steatohepatitis)   . Malnutrition ([place]) 07/25/2019  . Symptomatic anemia 07/25/2019  . Tobacco abuse 07/25/2019  . Mass of soft tissue of face 03/06/2019  . HSV infection  02/23/2019  . Aortic atherosclerosis ([place]) 01/22/2019  . Coronary atherosclerosis 01/22/2019  . Encounter for screening for lung cancer 01/04/2019  . Low back pain 12/07/2018  . Venous insufficiency 11/15/2018  . Melena   . Esophageal varices without bleeding ([place]) 10/27/2018  . Anemia 10/23/2018  . Fatigue 10/23/2018  . Poor balance 05/30/2018  . Falls 05/30/2018  . Generalized weakness 05/30/2018  . Cirrhosis of liver ([place]) 04/24/2018  . Ascites 04/24/2018  . Gallstones 04/24/2018  . Heme positive stool 04/24/2018  . Screening mammogram, encounter for 11/07/2017  . Smoker 11/07/2017  . Screening examination for STD (sexually transmitted disease) 11/11/2016  . Welcome to Medicare preventive visit 10/26/2016  . Estrogen deficiency 10/26/2016  . Colon cancer screening 11/01/2014  . Elevated liver enzymes 11/01/2014  . Encounter for routine gynecological examination 09/12/2013  . Rapid heart beat 10/10/2012  . Heartburn 08/20/2011  . Routine general medical examination at a health care facility 07/21/2011  . Vitamin D deficiency 08/04/2009  . POSTMENOPAUSAL STATUS 08/04/2009  . Controlled diabetes mellitus type 2 with complications ([place]) 54/62/7035  . Hyperlipidemia associated with type 2 diabetes mellitus ([place]) 06/14/2008  . [place] DISEASE, CERVICAL 03/30/2007  . History of alcohol abuse 12/06/2006  . Neuropathy of both feet 12/06/2006  . DIVERTICULOSIS, COLON 12/06/2006  . Fatty liver 12/06/2006  . BREAST CANCER, HX OF 12/06/2006   Past Medical History:  Diagnosis Date  . Alcohol abuse, unspecified   . Breast cancer ([place]) 1998   Right  . Cataract    left eye  . Cervical spondylosis 2006   MRI  . Degeneration of cervical intervertebral disc 2006   MRI  . Diabetes mellitus without complication ([place])   . Diverticulosis of colon (without mention of hemorrhage)   . Hyperpotassemia   . Microscopic hematuria   . Mononeuritis of unspecified site   . Nonspecific abnormal  results of liver function study   . Other abnormal glucose   . Other and unspecified hyperlipidemia    no per pt  . Other chronic nonalcoholic liver disease   . Personal history of chemotherapy   . Personal history of malignant neoplasm of breast   . Personal history of radiation therapy   . Pneumothorax, acute    right, spontaneous  . Tobacco use disorder   . Unspecified vitamin D deficiency    Past Surgical History:  Procedure Laterality Date  . BIOPSY  07/27/2019   Procedure: BIOPSY;  Surgeon: Yetta Flock, MD;  Location: [facility];  Service: Gastroenterology;;  . BREAST BIOPSY  9/03   Right  . BREAST LUMPECTOMY Right 1998  .  BREAST LUMPECTOMY  1998  . CHEST TUBE INSERTION  11/02/2014  . COLONOSCOPY    . COLONOSCOPY WITH PROPOFOL N/A 07/27/2019   Procedure: COLONOSCOPY WITH PROPOFOL;  Surgeon: Yetta Flock, MD;  Location: [facility];  Service: Gastroenterology;  Laterality: N/A;  . ESOPHAGOGASTRODUODENOSCOPY (EGD) WITH PROPOFOL N/A 10/30/2018   Procedure: ESOPHAGOGASTRODUODENOSCOPY (EGD) WITH PROPOFOL;  Surgeon: Mauri Pole, MD;  Location: [facility];  Service: Endoscopy;  Laterality: N/A;  . ESOPHAGOGASTRODUODENOSCOPY (EGD) WITH PROPOFOL N/A 03/12/2019   Procedure: ESOPHAGOGASTRODUODENOSCOPY (EGD) WITH PROPOFOL;  Surgeon: Mauri Pole, MD;  Location: [facility];  Service: Endoscopy;  Laterality: N/A;  . ESOPHAGOGASTRODUODENOSCOPY (EGD) WITH PROPOFOL N/A 07/27/2019   Procedure: ESOPHAGOGASTRODUODENOSCOPY (EGD) WITH PROPOFOL;  Surgeon: Yetta Flock, MD;  Location: [facility];  Service: Gastroenterology;  Laterality: N/A;  . EYE SURGERY  02/2017   cataract extraction with lens implant-left  . HOT HEMOSTASIS N/A 07/27/2019   Procedure: HOT HEMOSTASIS (ARGON PLASMA COAGULATION/BICAP);  Surgeon: Yetta Flock, MD;  Location: [facility];  Service: Gastroenterology;  Laterality: N/A;  . MOUTH SURGERY    . POLYPECTOMY  07/27/2019    Procedure: POLYPECTOMY;  Surgeon: Yetta Flock, MD;  Location: [facility];  Service: Gastroenterology;;  . TUBAL LIGATION     Social History   Tobacco Use  . Smoking status: Current Every Day Smoker    Packs/day: 1.00    Years: 53.00    Pack years: 53.00    Types: Cigarettes  . Smokeless tobacco: Never Used  . Tobacco comment: tobacco info given 04/27/2018  Substance Use Topics  . Alcohol use: Not Currently  . Drug use: No   Family History  Problem Relation Age of Onset  . Heart failure Father   . Heart attack Father   . Colon cancer Maternal Uncle   . Stroke Mother   . Esophageal cancer Neg Hx   . Rectal cancer Neg Hx   . Stomach cancer Neg Hx    Allergies  Allergen Reactions  . Oxycodone Nausea And Vomiting  . Glipizide Other (See Comments)    Stomach pain   Current Outpatient Medications on File Prior to Visit  Medication Sig Dispense Refill  . albuterol (VENTOLIN HFA) 108 (90 Base) MCG/ACT inhaler Inhale 2 puffs into the lungs every 4 (four) hours as needed for wheezing or shortness of breath. 18 g 3  . furosemide (LASIX) 20 MG tablet Take 1 tablet (20 mg total) by mouth daily. 90 tablet 1  . lactulose (CHRONULAC) 10 GM/15ML solution Take 15 mLs (10 g total) by mouth 3 (three) times daily. (Patient taking differently: Take 20 g by mouth See admin instructions. 2 tablespoons daily) 236 mL 11  . metFORMIN (GLUCOPHAGE XR) 500 MG 24 hr tablet Take 1 tablet (500 mg total) by mouth daily with breakfast. 90 tablet 3  . Multiple Vitamin (MULTIVITAMIN) tablet Take 1 tablet by mouth daily.    . nadolol (CORGARD) 20 MG tablet Take 1 tablet (20 mg total) by mouth daily. 30 tablet 11  . nystatin (MYCOSTATIN) 100000 UNIT/ML suspension Take 5 mLs (500,000 Units total) by mouth 3 (three) times daily. Swish and swallow 120 mL 0  . pantoprazole (PROTONIX) 40 MG tablet TAKE 1 TABLET (40 MG TOTAL) BY MOUTH 2 (TWO) TIMES DAILY BEFORE A MEAL. 180 tablet 1  . Polyethyl Glycol-Propyl  Glycol (SYSTANE OP) Place 1 drop into the left eye daily as needed (dryness).    . rosuvastatin (CRESTOR) 5 MG tablet Take 1 tablet (5  mg total) by mouth daily at 6 PM. 30 tablet 11  . spironolactone (ALDACTONE) 25 MG tablet Take 1 tablet (25 mg total) by mouth daily. 30 tablet 1  . traZODone (DESYREL) 50 MG tablet Take 0.5-1 tablets (25-50 mg total) by mouth at bedtime as needed for sleep. 30 tablet 3  . XIFAXAN 550 MG TABS tablet Take 1 tablet (550 mg total) by mouth 2 (two) times daily. 90 tablet 3   No current facility-administered medications on file prior to visit.     Review of Systems  Constitutional: Positive for appetite change and fatigue. Negative for activity change, fever and unexpected weight change.  HENT: Negative for congestion, ear pain, rhinorrhea, sinus pressure and sore throat.   Eyes: Negative for pain, redness and visual disturbance.  Respiratory: Positive for cough and wheezing. Negative for shortness of breath.        More wheezing lately along with smokers cough  Cardiovascular: Negative for chest pain and palpitations.  Gastrointestinal: Positive for abdominal pain. Negative for abdominal distention, anal bleeding, blood in stool, constipation, diarrhea, nausea, rectal pain and vomiting.  Endocrine: Negative for polydipsia and polyuria.  Genitourinary: Negative for dysuria, frequency and urgency.  Musculoskeletal: Negative for arthralgias, back pain and myalgias.  Skin: Negative for pallor and rash.  Allergic/Immunologic: Negative for environmental allergies.  Neurological: Negative for dizziness, syncope and headaches.  Hematological: Negative for adenopathy. Does not bruise/bleed easily.  Psychiatric/Behavioral: Negative for decreased concentration and dysphoric mood. The patient is nervous/anxious.        Objective:   Physical Exam Constitutional:      General: She is not in acute distress.    Appearance: Normal appearance. She is well-developed and  normal weight. She is not ill-appearing.     Comments: Markedly underweight and frail appearing   HENT:     Head: Normocephalic and atraumatic.     Right Ear: External ear normal.     Left Ear: External ear normal.     Nose: Nose normal.     Mouth/Throat:     Mouth: Mucous membranes are moist.     Pharynx: Oropharynx is clear.  Eyes:     General: No scleral icterus.       Right eye: No discharge.        Left eye: No discharge.     Conjunctiva/sclera: Conjunctivae normal.     Pupils: Pupils are equal, round, and reactive to light.  Neck:     Thyroid: No thyromegaly.     Vascular: No carotid bruit or JVD.  Cardiovascular:     Rate and Rhythm: Normal rate and regular rhythm.     Heart sounds: Normal heart sounds. No gallop.   Pulmonary:     Effort: Pulmonary effort is normal. No respiratory distress.     Breath sounds: Normal breath sounds. No wheezing or rales.     Comments: Diffusely distant bs No wheezing today  occ junky sounding smoker's cough Abdominal:     General: Bowel sounds are normal. There is no distension.     Palpations: Abdomen is soft. There is no mass.     Tenderness: There is abdominal tenderness. There is no guarding or rebound.     Hernia: No hernia is present.     Comments: Mild upper abdomen tenderness worse on L side    Musculoskeletal:        General: No tenderness.     Cervical back: Normal range of motion and neck supple.  Right lower leg: No edema.     Left lower leg: No edema.  Lymphadenopathy:     Cervical: No cervical adenopathy.  Skin:    General: Skin is warm and dry.     Coloration: Skin is not jaundiced or pale.     Findings: No erythema or rash.     Comments: No jaundice or pallor  Neurological:     Mental Status: She is alert.     Cranial Nerves: No cranial nerve deficit.     Sensory: No sensory deficit.     Motor: No abnormal muscle tone.     Coordination: Coordination normal.     Deep Tendon Reflexes: Reflexes are normal and  symmetric. Reflexes normal.  Psychiatric:        Mood and Affect: Mood normal.     Comments: Confuses easily  Asks pertinent questions            Assessment & Plan:   Problem List Items Addressed This Visit      Cardiovascular and Mediastinum   Esophageal varices without bleeding (HCC)    No bleeding on last EGD      Aortic atherosclerosis (HCC)    No clinical changes       AVM (arteriovenous malformation) of colon    Noted during hospitalization for severe anemia  Treated  F/u with GI planned        Digestive   Cirrhosis of liver ([place])    Due to past etoh abuse No longer drinking  Fairly stable LFTs  Recent hosp for severe anemia esoph varicies noted  Ascites in control with current medications Continues f/u with GI and hepatology         Endocrine   Hyperlipidemia associated with type 2 diabetes mellitus ([place])    Controlled with low dose crestor (stable LFTs) Disc goals for lipids and reasons to control them Rev last labs with pt Rev low sat fat diet in detail       Controlled diabetes mellitus type 2 with complications ([place])    Lab Results  Component Value Date   HGBA1C 6.5 07/31/2019   In good control with metformin and diet  Needs more protein calories  Also in setting of cirrhosis         Other   Anemia - Primary    Recent severe anemia (Hb of 6.5) with transfusions and EGD/Colonoscopy  In setting of cirrhosis  Reviewed hospital records, lab results and studies in detail   Varices noted-not bleeding at time of exam  Colonic avms tx and polyps removed GI f/u planned next month Continues pantoprazole  Hb up to 9.6- improved Unable to tolerate oral iron  GI provider is trying to get IV iron paid for by insurance          Malnutrition ([place])    Strongly enc more protein intake  Rev diet Poor appetite is multi factorial        Tobacco abuse    Disc in detail risks of smoking and possible outcomes including copd, vascular/ heart  disease, cancer , respiratory and sinus infections  Pt voices understanding Pt has cut down to 1/2 ppd  Plans to quit with patch but unsure when  More copd symptoms lately      Protein-calorie malnutrition, severe    Rev diet  Needs more protein intake  Poor appetite -multifactorial       Abdominal pain    Vague abd pain with eating or drinking-since hosp for  anemia Reviewed hospital records, lab results and studies in detail  Rev EGD and colonoscopy  Varices/and portal gastropathy noted Aware she has gallstones  Taking ppi Will f/u with GI and update if this becomes worse

## 2019-08-08 NOTE — Assessment & Plan Note (Signed)
Noted during hospitalization for severe anemia  Treated  F/u with GI planned

## 2019-08-08 NOTE — Assessment & Plan Note (Signed)
Rev diet  Needs more protein intake  Poor appetite -multifactorial

## 2019-08-09 ENCOUNTER — Telehealth: Payer: Self-pay | Admitting: Physician Assistant

## 2019-08-09 NOTE — Telephone Encounter (Signed)
Received a new hem referral from Dr. [name] for IDA. [name] has been cld and scheduled to see Cassie on 5/4 at 1:30pm w/labs at 1pm. Pt aware to arrive 15 minutes early.

## 2019-08-12 ENCOUNTER — Other Ambulatory Visit: Payer: Self-pay | Admitting: Physician Assistant

## 2019-08-12 DIAGNOSIS — D500 Iron deficiency anemia secondary to blood loss (chronic): Secondary | ICD-10-CM

## 2019-08-12 NOTE — Progress Notes (Signed)
[place] [phone]   [phone]  CONSULT NOTE  REFERRING PHYSICIAN: Dr. [name]  REASON FOR CONSULTATION:  Anemia   HPI Christine Barton is a 69 y.o. female with a past medical history significant for cirrhosis secondary to alcohol use and NASH, history of breast cancer, history of hyperlipidemia, history of esophageal varices without bleeding, venous insufficiency, atherosclerosis, colonic AVMs, diverticulosis, diabetes mellitus, and vitamin D deficiency is referred to the clinic for evaluation of anemia.  The patient was seen in the emergency room on 07/25/2019 for the chief complaint of low H&H on blood work performed in her gastroenterologist office.  Her hemoglobin was 6.5 and her hematocrit was 21.3%.  Her MCV was 64.9.  Her iron studies demonstrated a total iron low at 10, ferritin low at 5, TIBC elevated at 448, low iron saturation at 2%. Her vitamin B12 was WNL. She was symptomatic and experiencing fatigue and lightheadedness.  The patient was admitted for further work-up and she had an upper endoscopy and colonoscopy performed on 07/27/2019 which noted small esophageal varices without bleeding and portal hypertension gastritis.  Her colonoscopy demonstrated colonic angiodysplastic lesions status post APC and 3 mm polyp in the cecum and 3 mm polyp in the sigmoid colon.  While in the hospital she received 3 packed RBCs of blood and Feraheme x1.  The patient denied any symptoms of overt bleeding. She follows closely with her gastroenterologist who she is planning on seeing on 08/21/2019. She is treated with lactulose, Xifaxan, Lasix, spironolactone, and nadolol for her cirrhosis.  The patient notes that she has been told that she has been anemic on and off for most of her life but became significantly anemic over the past year.  She states she needed IV iron approximately 1 year ago.  She tried taking prescription oral iron supplements in the past and   experienced severe abdominal pain.  She denied any constipation.  The patient continues to deny any noticeable bleeding including nosebleeding, gum bleeding, hematemesis, hemoptysis, melena, hematochezia, or hematuria.  She notes improved fatigue/lightheadedness compared to prior to her admission.  The patient denies any history of bariatric surgery.  She denies any certain dietary habits such as being a vegan or vegetarian. She denies frequent NSAID or tylenol use.   The patient's family history significant for mother who was Herbert Pun with manic depression and a stroke.  The patient's father had "heart problems", diabetes mellitus, and vascular disease.  Patient denies any family history of anemia.  The patient denies any current alcohol use.  She quit drinking alcohol over a year ago.  Her history of alcohol abuse consists of drinking approximately 1-2 drinks per day.  The patient has a history of tobacco use.  She is currently in the process of trying to quit.  She smoked approximately 1 pack/day for 53 years.  She is currently down to half a pack a day.   HPI  Past Medical History:  Diagnosis Date  . Alcohol abuse, unspecified   . Breast cancer ([place]) 1998   Right  . Cataract    left eye  . Cervical spondylosis 2006   MRI  . Degeneration of cervical intervertebral disc 2006   MRI  . Diabetes mellitus without complication ([place])   . Diverticulosis of colon (without mention of hemorrhage)   . Hyperpotassemia   . Microscopic hematuria   . Mononeuritis of unspecified site   . Nonspecific abnormal results of liver function study   .  Other abnormal glucose   . Other and unspecified hyperlipidemia    no per pt  . Other chronic nonalcoholic liver disease   . Personal history of chemotherapy   . Personal history of malignant neoplasm of breast   . Personal history of radiation therapy   . Pneumothorax, acute    right, spontaneous  . Tobacco use disorder   . Unspecified vitamin D deficiency      Past Surgical History:  Procedure Laterality Date  . BIOPSY  07/27/2019   Procedure: BIOPSY;  Surgeon: Yetta Flock, MD;  Location: [facility];  Service: Gastroenterology;;  . BREAST BIOPSY  9/03   Right  . BREAST LUMPECTOMY Right 1998  . BREAST LUMPECTOMY  1998  . CHEST TUBE INSERTION  11/02/2014  . COLONOSCOPY    . COLONOSCOPY WITH PROPOFOL N/A 07/27/2019   Procedure: COLONOSCOPY WITH PROPOFOL;  Surgeon: Yetta Flock, MD;  Location: [facility];  Service: Gastroenterology;  Laterality: N/A;  . ESOPHAGOGASTRODUODENOSCOPY (EGD) WITH PROPOFOL N/A 10/30/2018   Procedure: ESOPHAGOGASTRODUODENOSCOPY (EGD) WITH PROPOFOL;  Surgeon: Mauri Pole, MD;  Location: [facility];  Service: Endoscopy;  Laterality: N/A;  . ESOPHAGOGASTRODUODENOSCOPY (EGD) WITH PROPOFOL N/A 03/12/2019   Procedure: ESOPHAGOGASTRODUODENOSCOPY (EGD) WITH PROPOFOL;  Surgeon: Mauri Pole, MD;  Location: [facility];  Service: Endoscopy;  Laterality: N/A;  . ESOPHAGOGASTRODUODENOSCOPY (EGD) WITH PROPOFOL N/A 07/27/2019   Procedure: ESOPHAGOGASTRODUODENOSCOPY (EGD) WITH PROPOFOL;  Surgeon: Yetta Flock, MD;  Location: [facility];  Service: Gastroenterology;  Laterality: N/A;  . EYE SURGERY  02/2017   cataract extraction with lens implant-left  . HOT HEMOSTASIS N/A 07/27/2019   Procedure: HOT HEMOSTASIS (ARGON PLASMA COAGULATION/BICAP);  Surgeon: Yetta Flock, MD;  Location: [facility];  Service: Gastroenterology;  Laterality: N/A;  . MOUTH SURGERY    . POLYPECTOMY  07/27/2019   Procedure: POLYPECTOMY;  Surgeon: Yetta Flock, MD;  Location: [facility];  Service: Gastroenterology;;  . TUBAL LIGATION      Family History  Problem Relation Age of Onset  . Heart failure Father   . Heart attack Father   . Colon cancer Maternal Uncle   . Stroke Mother   . Esophageal cancer Neg Hx   . Rectal cancer Neg Hx   . Stomach cancer Neg Hx     Social History Social  History   Tobacco Use  . Smoking status: Current Every Day Smoker    Packs/day: 1.00    Years: 53.00    Pack years: 53.00    Types: Cigarettes  . Smokeless tobacco: Never Used  . Tobacco comment: tobacco info given 04/27/2018  Substance Use Topics  . Alcohol use: Not Currently  . Drug use: No    Allergies  Allergen Reactions  . Oxycodone Nausea And Vomiting  . Glipizide Other (See Comments)    Stomach pain    Current Outpatient Medications  Medication Sig Dispense Refill  . albuterol (VENTOLIN HFA) 108 (90 Base) MCG/ACT inhaler Inhale 2 puffs into the lungs every 4 (four) hours as needed for wheezing or shortness of breath. 18 g 3  . furosemide (LASIX) 20 MG tablet Take 1 tablet (20 mg total) by mouth daily. 90 tablet 1  . lactulose (CHRONULAC) 10 GM/15ML solution Take 15 mLs (10 g total) by mouth 3 (three) times daily. (Patient taking differently: Take 20 g by mouth See admin instructions. 2 tablespoons daily) 236 mL 11  . metFORMIN (GLUCOPHAGE XR) 500 MG 24 hr tablet Take 1 tablet (500 mg total) by  mouth daily with breakfast. 90 tablet 3  . Multiple Vitamin (MULTIVITAMIN) tablet Take 1 tablet by mouth daily.    . nadolol (CORGARD) 20 MG tablet Take 1 tablet (20 mg total) by mouth daily. 30 tablet 11  . pantoprazole (PROTONIX) 40 MG tablet TAKE 1 TABLET (40 MG TOTAL) BY MOUTH 2 (TWO) TIMES DAILY BEFORE A MEAL. 180 tablet 1  . Polyethyl Glycol-Propyl Glycol (SYSTANE OP) Place 1 drop into the left eye daily as needed (dryness).    . rosuvastatin (CRESTOR) 5 MG tablet Take 1 tablet (5 mg total) by mouth daily at 6 PM. 30 tablet 11  . spironolactone (ALDACTONE) 25 MG tablet Take 1 tablet (25 mg total) by mouth daily. 30 tablet 1  . traZODone (DESYREL) 50 MG tablet Take 0.5-1 tablets (25-50 mg total) by mouth at bedtime as needed for sleep. 30 tablet 3  . XIFAXAN 550 MG TABS tablet Take 1 tablet (550 mg total) by mouth 2 (two) times daily. 90 tablet 3  . nystatin (MYCOSTATIN) 100000  UNIT/ML suspension Take 5 mLs (500,000 Units total) by mouth 3 (three) times daily. Swish and swallow (Patient not taking: Reported on 08/14/2019) 120 mL 0   No current facility-administered medications for this visit.    Review of Systems  REVIEW OF SYSTEMS:   Review of Systems  Constitutional: Positive for fatigue.  Negative for appetite change, chills, fever and unexpected weight change.  HENT: Negative for mouth sores, nosebleeds, sore throat and trouble swallowing.   Eyes: Negative for eye problems and icterus.  Respiratory: Positive for baseline shortness of breath with exertion secondary to COPD and tobacco use.  Negative for cough, hemoptysis, and wheezing.  Cardiovascular: Negative for chest pain and leg swelling.  Gastrointestinal: Negative for abdominal pain, constipation, diarrhea, nausea and vomiting.  Genitourinary: Negative for bladder incontinence, difficulty urinating, dysuria, frequency and hematuria.   Musculoskeletal: Negative for back pain, gait problem, neck pain and neck stiffness.  Skin: Negative for itching and rash.  Neurological: Negative for dizziness, extremity weakness, gait problem, headaches, light-headedness and seizures.  Hematological: Negative for adenopathy. Does not bruise/bleed easily.  Psychiatric/Behavioral: Negative for confusion, depression and sleep disturbance. The patient is not nervous/anxious.     PHYSICAL EXAMINATION:  Blood pressure 123/60, pulse 77, temperature 98.9 F (37.2 C), temperature source Temporal, resp. rate 18, height 5' 5"  (1.651 m), weight 102 lb 6.4 oz (46.4 kg), SpO2 100 %.  ECOG PERFORMANCE STATUS: 1  Physical Exam  Constitutional: Oriented to person, place, and time and cachectic appearing female and in no distress.  HENT:  Head: Normocephalic and atraumatic.  Mouth/Throat: Oropharynx is clear and moist. No oropharyngeal exudate.  Eyes: Conjunctivae are normal. Right eye exhibits no discharge. Left eye exhibits no  discharge. No scleral icterus.  Neck: Normal range of motion. Neck supple.  Cardiovascular: Normal rate, regular rhythm, normal heart sounds and intact distal pulses.   Pulmonary/Chest: Effort normal and breath sounds normal. No respiratory distress. No wheezes. No rales.  Abdominal: Soft. Tenderness in the RUQ. Bowel sounds are normal. Exhibits no distension and no mass.  Musculoskeletal: Normal range of motion. Exhibits no edema.  Lymphadenopathy:    No cervical adenopathy.  Neurological: Alert and oriented to person, place, and time. Exhibits normal muscle tone. Gait normal. Coordination normal.  Skin: Skin is warm and dry. No rash noted. Not diaphoretic. No erythema. No pallor.  Psychiatric: Mood, memory and judgment normal.  Vitals reviewed.  LABORATORY DATA: Lab Results  Component Value Date  WBC 6.1 08/14/2019   HGB 10.5 (L) 08/14/2019   HCT 34.3 (L) 08/14/2019   MCV 82.7 08/14/2019   PLT 197 08/14/2019      Chemistry      Component Value Date/Time   NA 138 07/31/2019 0858   K 4.3 07/31/2019 0858   CL 103 07/31/2019 0858   CO2 29 07/31/2019 0858   BUN 12 07/31/2019 0858   CREATININE 0.62 07/31/2019 0858      Component Value Date/Time   CALCIUM 9.6 07/31/2019 0858   ALKPHOS 108 07/31/2019 0858   AST 22 07/31/2019 0858   ALT 10 07/31/2019 0858   BILITOT 0.7 07/31/2019 0858   BILITOT 0.7 05/14/2019 1207       RADIOGRAPHIC STUDIES: DG Chest 2 View  Result Date: 07/25/2019 CLINICAL DATA:  Low hemoglobin EXAM: CHEST - 2 VIEW COMPARISON:  04/18/2018, CT 04/19/2018 FINDINGS: Mild reticular opacity on the right likely due to fibrosis demonstrated on prior CT. Mild asymmetric opacity at the right apex, likely corresponds to the CT demonstrated masslike fibrosis. Stable cardiomediastinal silhouette. No pneumothorax. Clips over the right axilla. IMPRESSION: No active cardiopulmonary disease. Asymmetric right apical opacity presumably corresponds to the CT area of masslike  fibrosis. Electronically Signed   By: Donavan Foil M.D.   On: 07/25/2019 21:16    ASSESSMENT: This is a very pleasant 69 year old Caucasian female referred to the clinic for evaluation of iron deficiency anemia.    PLAN: The patient was seen with Dr. Julien Nordmann today.  The patient had repeat CBC, CMP, iron studies, and ferritin performed today.  Her CBC demonstrates improved but persistent anemia with hemoglobin of 10.5.  Her iron studies demonstrate improved but continued low iron with a total iron of 40, TIBC elevated at 475, saturation ratio low at 8%. Her ferritin is 197.   Dr. Julien Nordmann recommends that we arrange for the patient to have IV iron with Venofer weekly x4.  The patient is intolerant to oral iron supplements with severe abdominal pain.  I will arrange for the patient to receive her first iron infusion on Friday, Aug 17, 2019.  We will see her back for follow-up visit 3 months for evaluation and a repeat CBC, iron studies, and ferritin.  The patient voices understanding of current disease status and treatment options and is in agreement with the current care plan.  All questions were answered. The patient knows to call the clinic with any problems, questions or concerns. We can certainly see the patient much sooner if necessary.  Thank you so much for allowing me to participate in the care of Christine Barton. I will continue to follow up the patient with you and assist in her care.  The total time spent in the appointment was 60 minutes.  Disclaimer: This note was dictated with voice recognition software. Similar sounding words can inadvertently be transcribed and may not be corrected upon review.   Christine Barton L Christine Barton Aug 14, 2019, 2:50 PM  ADDENDUM: Hematology/Oncology Attending: I had a face-to-face encounter with the patient today.  I recommended her care plan.  This is a very pleasant 69 years old white female with persistent anemia secondary to iron deficiency  from gastrointestinal blood loss secondary to AV malformation. The patient received iron infusion during her hospitalization and her hemoglobin and hematocrit start improving.  She was tried on oral iron tablets in the past but she has severe pain and intolerability to the iron tablets. Repeat CBC and iron studies today confirm the presence  of persistent iron deficiency anemia. I recommended for the patient to proceed with iron infusion with Venofer for the next 4 weeks. We will see her back for follow-up visit in 3 months for evaluation with repeat CBC, iron study and ferritin. The patient was also advised to call immediately if she has any concerning symptoms in the interval.  Disclaimer: This note was dictated with voice recognition software. Similar sounding words can inadvertently be transcribed and may be missed upon review. Christine Kempf, MD 08/14/19

## 2019-08-14 ENCOUNTER — Telehealth: Payer: Self-pay | Admitting: Physician Assistant

## 2019-08-14 ENCOUNTER — Encounter: Payer: Self-pay | Admitting: Physician Assistant

## 2019-08-14 ENCOUNTER — Inpatient Hospital Stay: Payer: Medicare HMO

## 2019-08-14 ENCOUNTER — Other Ambulatory Visit: Payer: Self-pay

## 2019-08-14 ENCOUNTER — Inpatient Hospital Stay: Payer: Medicare HMO | Attending: Physician Assistant | Admitting: Physician Assistant

## 2019-08-14 ENCOUNTER — Other Ambulatory Visit: Payer: Self-pay | Admitting: Physician Assistant

## 2019-08-14 VITALS — BP 123/60 | HR 77 | Temp 98.9°F | Resp 18 | Ht 65.0 in | Wt 102.4 lb

## 2019-08-14 DIAGNOSIS — D500 Iron deficiency anemia secondary to blood loss (chronic): Secondary | ICD-10-CM

## 2019-08-14 DIAGNOSIS — I8510 Secondary esophageal varices without bleeding: Secondary | ICD-10-CM | POA: Insufficient documentation

## 2019-08-14 DIAGNOSIS — E785 Hyperlipidemia, unspecified: Secondary | ICD-10-CM | POA: Insufficient documentation

## 2019-08-14 DIAGNOSIS — F17210 Nicotine dependence, cigarettes, uncomplicated: Secondary | ICD-10-CM | POA: Insufficient documentation

## 2019-08-14 DIAGNOSIS — Z7984 Long term (current) use of oral hypoglycemic drugs: Secondary | ICD-10-CM | POA: Insufficient documentation

## 2019-08-14 DIAGNOSIS — K7030 Alcoholic cirrhosis of liver without ascites: Secondary | ICD-10-CM | POA: Insufficient documentation

## 2019-08-14 DIAGNOSIS — D509 Iron deficiency anemia, unspecified: Secondary | ICD-10-CM | POA: Diagnosis not present

## 2019-08-14 DIAGNOSIS — K5520 Angiodysplasia of colon without hemorrhage: Secondary | ICD-10-CM | POA: Diagnosis not present

## 2019-08-14 DIAGNOSIS — E559 Vitamin D deficiency, unspecified: Secondary | ICD-10-CM | POA: Insufficient documentation

## 2019-08-14 DIAGNOSIS — Z79899 Other long term (current) drug therapy: Secondary | ICD-10-CM | POA: Insufficient documentation

## 2019-08-14 DIAGNOSIS — E1136 Type 2 diabetes mellitus with diabetic cataract: Secondary | ICD-10-CM | POA: Diagnosis not present

## 2019-08-14 DIAGNOSIS — Z853 Personal history of malignant neoplasm of breast: Secondary | ICD-10-CM | POA: Insufficient documentation

## 2019-08-14 DIAGNOSIS — I872 Venous insufficiency (chronic) (peripheral): Secondary | ICD-10-CM | POA: Insufficient documentation

## 2019-08-14 DIAGNOSIS — D649 Anemia, unspecified: Secondary | ICD-10-CM

## 2019-08-14 DIAGNOSIS — K7581 Nonalcoholic steatohepatitis (NASH): Secondary | ICD-10-CM | POA: Insufficient documentation

## 2019-08-14 LAB — IRON AND TIBC
Iron: 40 ug/dL — ABNORMAL LOW (ref 41–142)
Saturation Ratios: 8 % — ABNORMAL LOW (ref 21–57)
TIBC: 475 ug/dL — ABNORMAL HIGH (ref 236–444)
UIBC: 435 ug/dL — ABNORMAL HIGH (ref 120–384)

## 2019-08-14 LAB — CBC WITH DIFFERENTIAL (CANCER CENTER ONLY)
Abs Immature Granulocytes: 0.04 10*3/uL (ref 0.00–0.07)
Basophils Absolute: 0 10*3/uL (ref 0.0–0.1)
Basophils Relative: 1 %
Eosinophils Absolute: 0.1 10*3/uL (ref 0.0–0.5)
Eosinophils Relative: 2 %
HCT: 34.3 % — ABNORMAL LOW (ref 36.0–46.0)
Hemoglobin: 10.5 g/dL — ABNORMAL LOW (ref 12.0–15.0)
Immature Granulocytes: 1 %
Lymphocytes Relative: 23 %
Lymphs Abs: 1.4 10*3/uL (ref 0.7–4.0)
MCH: 25.3 pg — ABNORMAL LOW (ref 26.0–34.0)
MCHC: 30.6 g/dL (ref 30.0–36.0)
MCV: 82.7 fL (ref 80.0–100.0)
Monocytes Absolute: 0.3 10*3/uL (ref 0.1–1.0)
Monocytes Relative: 5 %
Neutro Abs: 4.2 10*3/uL (ref 1.7–7.7)
Neutrophils Relative %: 68 %
Platelet Count: 197 10*3/uL (ref 150–400)
RBC: 4.15 MIL/uL (ref 3.87–5.11)
RDW: 29.4 % — ABNORMAL HIGH (ref 11.5–15.5)
WBC Count: 6.1 10*3/uL (ref 4.0–10.5)
nRBC: 0 % (ref 0.0–0.2)

## 2019-08-14 LAB — ABO/RH: ABO/RH(D): O POS

## 2019-08-14 LAB — FERRITIN: Ferritin: 197 ng/mL (ref 11–307)

## 2019-08-14 NOTE — Telephone Encounter (Signed)
Scheduled per los. Gave avs and calendar  

## 2019-08-15 ENCOUNTER — Telehealth: Payer: Self-pay | Admitting: Emergency Medicine

## 2019-08-15 ENCOUNTER — Telehealth: Payer: Self-pay | Admitting: Physician Assistant

## 2019-08-15 ENCOUNTER — Telehealth: Payer: Self-pay | Admitting: Medical Oncology

## 2019-08-15 ENCOUNTER — Telehealth: Payer: Self-pay

## 2019-08-15 NOTE — Telephone Encounter (Signed)
Pt states that she cannot find the 'Boost Breeze 20 G protein' shakes that she was given in the hospital. Pt states she has looked at [place], [place], and they are not sold anywhere. Pt states she can only find up to 9 g and is wondering if Dr. Glori Bickers knew where or how to get this for her, and if there is a prescription that can be sent in for her for this?

## 2019-08-15 NOTE — Telephone Encounter (Signed)
Received a message that the patient had some questions. Tried to return her call but got her voicemail. Left a message stating I would try to call her again first thing tomorrow.

## 2019-08-15 NOTE — Telephone Encounter (Signed)
I don't know for sure.  I recommend she calls her pharmacist and asks.   Can also look online (for instance [employer])  Let me know -if it is prescribable I'm happy to do it

## 2019-08-15 NOTE — Telephone Encounter (Signed)
Asking to speak to Cassie -"not about scheduling"

## 2019-08-15 NOTE — Telephone Encounter (Signed)
Incoming call on triage line from pt.  Pt states that she cannot find the 'Boost Breeze (fruit based) 20 G protein' shakes that she was given in the hospital anywhere for commercial purchase.  Pt states she can only find up to 9 g and was wondering if the CC would know where/how to get these specific supplement shakes.  Pt advised that dietitian Ernestene Kiel would be contacted regarding her question and would be able to follow up with an answer.  VM left on Barbara's phone with patient information for f/u.  Pt verbalized understanding to expect f/u call sometime tomorrow, denies any further questions/concerns at this time.

## 2019-08-16 ENCOUNTER — Encounter: Payer: Medicare HMO | Admitting: Nutrition

## 2019-08-16 ENCOUNTER — Telehealth: Payer: Self-pay | Admitting: Physician Assistant

## 2019-08-16 ENCOUNTER — Telehealth: Payer: Self-pay | Admitting: Nutrition

## 2019-08-16 NOTE — Telephone Encounter (Signed)
I received a message stating that the patient would like a return call from me. I called the patient. Left a voicemail for her to call us back.

## 2019-08-16 NOTE — Telephone Encounter (Signed)
I received a message from symptom management RN.  Patient is requesting information on boost breeze.  She would like to purchase this but has been unable to find it.  I contacted patient by telephone.  Explained boost breeze was only available to purchase at our [employer] or online.  This product has 250 cal and 9 g of protein.  Patient thought she received a 20 g protein breeze however manufacturer does not produce these.  Provided additional information about beverages that have higher protein content and would be available for purchase.  Patient appreciative.

## 2019-08-16 NOTE — Telephone Encounter (Signed)
Pt spoke to oncology dpt and they advised her of how to obtain this product

## 2019-08-17 ENCOUNTER — Other Ambulatory Visit: Payer: Self-pay

## 2019-08-17 ENCOUNTER — Inpatient Hospital Stay: Payer: Medicare HMO

## 2019-08-17 VITALS — BP 111/51 | HR 72 | Temp 98.0°F | Resp 18 | Wt 100.8 lb

## 2019-08-17 DIAGNOSIS — K7030 Alcoholic cirrhosis of liver without ascites: Secondary | ICD-10-CM | POA: Diagnosis not present

## 2019-08-17 DIAGNOSIS — K5520 Angiodysplasia of colon without hemorrhage: Secondary | ICD-10-CM

## 2019-08-17 DIAGNOSIS — D500 Iron deficiency anemia secondary to blood loss (chronic): Secondary | ICD-10-CM

## 2019-08-17 DIAGNOSIS — E785 Hyperlipidemia, unspecified: Secondary | ICD-10-CM | POA: Diagnosis not present

## 2019-08-17 DIAGNOSIS — E1136 Type 2 diabetes mellitus with diabetic cataract: Secondary | ICD-10-CM | POA: Diagnosis not present

## 2019-08-17 DIAGNOSIS — D509 Iron deficiency anemia, unspecified: Secondary | ICD-10-CM | POA: Diagnosis not present

## 2019-08-17 DIAGNOSIS — I872 Venous insufficiency (chronic) (peripheral): Secondary | ICD-10-CM | POA: Diagnosis not present

## 2019-08-17 DIAGNOSIS — I8510 Secondary esophageal varices without bleeding: Secondary | ICD-10-CM | POA: Diagnosis not present

## 2019-08-17 DIAGNOSIS — Z853 Personal history of malignant neoplasm of breast: Secondary | ICD-10-CM | POA: Diagnosis not present

## 2019-08-17 DIAGNOSIS — K7581 Nonalcoholic steatohepatitis (NASH): Secondary | ICD-10-CM | POA: Diagnosis not present

## 2019-08-17 MED ORDER — ACETAMINOPHEN 325 MG PO TABS
650.0000 mg | ORAL_TABLET | Freq: Once | ORAL | Status: AC
  Administered 2019-08-17: 650 mg via ORAL

## 2019-08-17 MED ORDER — LORATADINE 10 MG PO TABS
ORAL_TABLET | ORAL | Status: AC
  Filled 2019-08-17: qty 1

## 2019-08-17 MED ORDER — DIPHENHYDRAMINE HCL 25 MG PO CAPS: 50.0000 mg | ORAL_CAPSULE | Freq: Once | ORAL | Status: DC

## 2019-08-17 MED ORDER — SODIUM CHLORIDE 0.9 % IV SOLN
200.0000 mg | Freq: Once | INTRAVENOUS | Status: AC
  Administered 2019-08-17: 200 mg via INTRAVENOUS
  Filled 2019-08-17: qty 200

## 2019-08-17 MED ORDER — SODIUM CHLORIDE 0.9 % IV SOLN
Freq: Once | INTRAVENOUS | Status: AC
  Filled 2019-08-17: qty 250

## 2019-08-17 MED ORDER — LORATADINE 10 MG PO TABS
10.0000 mg | ORAL_TABLET | Freq: Once | ORAL | Status: AC
  Administered 2019-08-17: 10 mg via ORAL

## 2019-08-17 MED ORDER — ACETAMINOPHEN 325 MG PO TABS
ORAL_TABLET | ORAL | Status: AC
  Filled 2019-08-17: qty 2

## 2019-08-17 NOTE — Progress Notes (Signed)
Per Dr. Burr Medico, okay to change Benadryl 4m to Claritin 144m

## 2019-08-17 NOTE — Patient Instructions (Signed)

## 2019-08-21 ENCOUNTER — Encounter: Payer: Self-pay | Admitting: Gastroenterology

## 2019-08-21 ENCOUNTER — Ambulatory Visit: Payer: Medicare HMO | Admitting: Gastroenterology

## 2019-08-21 ENCOUNTER — Other Ambulatory Visit: Payer: Self-pay

## 2019-08-21 VITALS — BP 120/56 | HR 70 | Temp 97.3°F | Ht 65.0 in | Wt 103.2 lb

## 2019-08-21 DIAGNOSIS — D509 Iron deficiency anemia, unspecified: Secondary | ICD-10-CM | POA: Diagnosis not present

## 2019-08-21 DIAGNOSIS — K7581 Nonalcoholic steatohepatitis (NASH): Secondary | ICD-10-CM

## 2019-08-21 DIAGNOSIS — K7460 Unspecified cirrhosis of liver: Secondary | ICD-10-CM

## 2019-08-21 NOTE — Progress Notes (Signed)
Christine Barton    [id number]    [birth year]  Primary Care Physician:Tower, [name], MD  Referring Physician: Tower, [name], MD [place],  [place] [id number]   Chief complaint:  Anemia  HPI:  69 year old female with alcoholic and [name] cirrhosis here for follow-up visit for iron deficiency anemia and cirrhosis   She is feeling well overall.  She is receiving IV iron infusions.  Does not feel tired any longer  Denies any melena, rectal bleeding, abdominal pain, nausea, vomiting, abdominal distention or lower leg swelling.  Recently hospitalized for severe iron deficiency anemia.  EGD July 27, 2019 by Dr. Havery Moros: Small esophageal varices and portal hypertensive gastropathy otherwise normal exam  Colonoscopy July 27, 2019: 8 small AVMs scattered in the colon ablated with APC and 3 small polyps removed, diverticulosis and internal hemorrhoids  EGD February 23, 2019: Small grade 1 esophageal varices, portal hypertensive gastropathy  Colonoscopy November 30, 2016: Diverticulosis, sessile polyps X4 tubular adenomas and internal hemorrhoids.  Recall colonoscopy in 3 years    Outpatient Encounter Medications as of 08/21/2019  Medication Sig  . albuterol (VENTOLIN HFA) 108 (90 Base) MCG/ACT inhaler Inhale 2 puffs into the lungs every 4 (four) hours as needed for wheezing or shortness of breath.  . furosemide (LASIX) 20 MG tablet Take 1 tablet (20 mg total) by mouth daily.  Marland Kitchen lactulose (CHRONULAC) 10 GM/15ML solution Take 15 mLs (10 g total) by mouth 3 (three) times daily.  . metFORMIN (GLUCOPHAGE XR) 500 MG 24 hr tablet Take 1 tablet (500 mg total) by mouth daily with breakfast.  . Multiple Vitamin (MULTIVITAMIN) tablet Take 1 tablet by mouth daily.  . nadolol (CORGARD) 20 MG tablet Take 1 tablet (20 mg total) by mouth daily.  Marland Kitchen nystatin (MYCOSTATIN) 100000 UNIT/ML suspension Take 5 mLs (500,000 Units total) by mouth 3 (three) times daily. Swish  and swallow  . pantoprazole (PROTONIX) 40 MG tablet TAKE 1 TABLET (40 MG TOTAL) BY MOUTH 2 (TWO) TIMES DAILY BEFORE A MEAL.  [name] Glycol-Propyl Glycol (SYSTANE OP) Place 1 drop into the left eye daily as needed (dryness).  . rosuvastatin (CRESTOR) 5 MG tablet Take 1 tablet (5 mg total) by mouth daily at 6 PM.  . spironolactone (ALDACTONE) 25 MG tablet Take 1 tablet (25 mg total) by mouth daily.  . traZODone (DESYREL) 50 MG tablet Take 0.5-1 tablets (25-50 mg total) by mouth at bedtime as needed for sleep.  Marland Kitchen XIFAXAN 550 MG TABS tablet Take 1 tablet (550 mg total) by mouth 2 (two) times daily.   No facility-administered encounter medications on file as of 08/21/2019.    Allergies as of 08/21/2019 - Review Complete 08/21/2019  Allergen Reaction Noted  . Oxycodone Nausea And Vomiting 11/02/2014  . Glipizide Other (See Comments) 09/12/2013    Past Medical History:  Diagnosis Date  . Alcohol abuse, unspecified   . Breast cancer ([place]) 1998   Right  . Cataract    left eye  . Cervical spondylosis 2006   MRI  . Degeneration of cervical intervertebral disc 2006   MRI  . Diabetes mellitus without complication ([place])   . Diverticulosis of colon (without mention of hemorrhage)   . Hyperpotassemia   . Microscopic hematuria   . Mononeuritis of unspecified site   . Nonspecific abnormal results of liver function study   . Other abnormal glucose   . Other and unspecified hyperlipidemia  no per pt  . Other chronic nonalcoholic liver disease   . Personal history of chemotherapy   . Personal history of malignant neoplasm of breast   . Personal history of radiation therapy   . Pneumothorax, acute    right, spontaneous  . Tobacco use disorder   . Unspecified vitamin D deficiency     Past Surgical History:  Procedure Laterality Date  . BIOPSY  07/27/2019   Procedure: BIOPSY;  Surgeon: Yetta Flock, MD;  Location: [facility];  Service: Gastroenterology;;  . BREAST BIOPSY   9/03   Right  . BREAST LUMPECTOMY Right 1998  . BREAST LUMPECTOMY  1998  . CHEST TUBE INSERTION  11/02/2014  . COLONOSCOPY    . COLONOSCOPY WITH PROPOFOL N/A 07/27/2019   Procedure: COLONOSCOPY WITH PROPOFOL;  Surgeon: Yetta Flock, MD;  Location: [facility];  Service: Gastroenterology;  Laterality: N/A;  . ESOPHAGOGASTRODUODENOSCOPY (EGD) WITH PROPOFOL N/A 10/30/2018   Procedure: ESOPHAGOGASTRODUODENOSCOPY (EGD) WITH PROPOFOL;  Surgeon: Mauri Pole, MD;  Location: [facility];  Service: Endoscopy;  Laterality: N/A;  . ESOPHAGOGASTRODUODENOSCOPY (EGD) WITH PROPOFOL N/A 03/12/2019   Procedure: ESOPHAGOGASTRODUODENOSCOPY (EGD) WITH PROPOFOL;  Surgeon: Mauri Pole, MD;  Location: [facility];  Service: Endoscopy;  Laterality: N/A;  . ESOPHAGOGASTRODUODENOSCOPY (EGD) WITH PROPOFOL N/A 07/27/2019   Procedure: ESOPHAGOGASTRODUODENOSCOPY (EGD) WITH PROPOFOL;  Surgeon: Yetta Flock, MD;  Location: [facility];  Service: Gastroenterology;  Laterality: N/A;  . EYE SURGERY  02/2017   cataract extraction with lens implant-left  . HOT HEMOSTASIS N/A 07/27/2019   Procedure: HOT HEMOSTASIS (ARGON PLASMA COAGULATION/BICAP);  Surgeon: Yetta Flock, MD;  Location: [facility];  Service: Gastroenterology;  Laterality: N/A;  . MOUTH SURGERY    . POLYPECTOMY  07/27/2019   Procedure: POLYPECTOMY;  Surgeon: Yetta Flock, MD;  Location: [facility];  Service: Gastroenterology;;  . TUBAL LIGATION      Family History  Problem Relation Age of Onset  . Heart failure Father   . Heart attack Father   . Colon cancer Maternal Uncle   . Stroke Mother   . Esophageal cancer Neg Hx   . Rectal cancer Neg Hx   . Stomach cancer Neg Hx   . Pancreatic cancer Neg Hx     Social History   Socioeconomic History  . Marital status: Single    Spouse name: Not on file  . Number of children: 1  . Years of education: Not on file  . Highest education level: Not on file   Occupational History  . Occupation: retired    Fish farm manager: [employer]  Tobacco Use  . Smoking status: Current Every Day Smoker    Packs/day: 1.00    Years: 53.00    Pack years: 53.00    Types: Cigarettes  . Smokeless tobacco: Never Used  . Tobacco comment: tobacco info given 04/27/2018  Substance and Sexual Activity  . Alcohol use: Not Currently  . Drug use: No  . Sexual activity: Not Currently  Other Topics Concern  . Not on file  Social History Narrative   Divorced      1 child      Works at [employer] Strain: [place]   . Difficulty of Paying Living Expenses: Not hard at all  Food Insecurity: No Food Insecurity  . Worried About Charity fundraiser in the Last Year: Never true  . Ran Out of  Food in the Last Year: Never true  Transportation Needs: No Transportation Needs  . Lack of Transportation (Medical): No  . Lack of Transportation (Non-Medical): No  Physical Activity: Inactive  . Days of Exercise per Week: 0 days  . Minutes of Exercise per Session: 0 min  Stress: No Stress Concern Present  . Feeling of Stress : Only a little  Social Connections:   . Frequency of Communication with Friends and Family:   . Frequency of Social Gatherings with Friends and Family:   . Attends Religious Services:   . Active Member of Clubs or Organizations:   . Attends Archivist Meetings:   Marland Kitchen Marital Status:   Intimate Partner Violence: Not At Risk  . Fear of Current or Ex-Partner: No  . Emotionally Abused: No  . Physically Abused: No  . Sexually Abused: No      Review of systems: All other review of systems negative except as mentioned in the HPI.   Physical Exam: Vitals:   08/21/19 1514  BP: (!) 120/56  Pulse: 70  Temp: (!) 97.3 F (36.3 C)  SpO2: 100%   Body mass index is 17.18 kg/m. Gen:      No acute distress Abd:     soft, non-tender; no palpable masses, no distension Ext:     No edema; adequate peripheral perfusion Skin:      Warm and dry; no rash Neuro: alert and oriented x 3 Psych: normal mood and affect  Data Reviewed:  Reviewed labs, radiology imaging, old records and pertinent past GI work up   Assessment and Plan/Recommendations:  69 year old female with history of [name] and alcoholic cirrhosis decompensated by ascites and hepatic encephalopathy  Meld score 8  Severe iron deficiency anemia secondary to chronic occult GI blood clots S/p recent hospitalization with EGD and colonoscopy with several AVMs s/p APC Continue IV iron infusion We will recheck CBC and iron panel 4 weeks after completion of IV iron infusion Plan to monitor CBC every 4 weeks thereafter  Ascites and volume status: Stable Continue Lasix 20 mg daily and Aldactone 25 mg every other day  Hepatic encephalopathy: Stable Continue Xifaxan 550 mg twice daily Lactulose 1 teaspoon 1-2 times daily as needed to have 2-3 soft bowel movements daily  Up-to-date with [place] screening  Return in 3 months or sooner if needed  This visit required 35 minutes of patient care (this includes precharting, chart review, review of results, face-to-face time used for counseling as well as treatment plan and follow-up. The patient was provided an opportunity to ask questions and all were answered. The patient agreed with the plan and demonstrated an understanding of the instructions.  Damaris Hippo , MD    CC: Tower, [name], MD

## 2019-08-21 NOTE — Patient Instructions (Addendum)
If you are age 69 or older, your body mass index should be between 23-30. Your Body mass index is 17.18 kg/m. If this is out of the aforementioned range listed, please consider follow up with your Primary Care Provider.  If you are age 25 or younger, your body mass index should be between 19-25. Your Body mass index is 17.18 kg/m. If this is out of the aformentioned range listed, please consider follow up with your Primary Care Provider.   Your provider has requested that you go to the basement level for lab work( 10/01/19). Press "B" on the elevator. The lab is located at the first door on the left as you exit the elevator.  You will need monthly CBC starting 10/31/19.  We will see you back in 3 months.   Due to recent changes in healthcare laws, you may see the results of your imaging and laboratory studies on MyChart before your provider has had a chance to review them.  We understand that in some cases there may be results that are confusing or concerning to you. Not all laboratory results come back in the same time frame and the provider may be waiting for multiple results in order to interpret others.  Please give [place] 48 hours in order for your provider to thoroughly review all the results before contacting the office for clarification of your results.   Thank you for choosing me and [facility].  Dr.Nandigam

## 2019-08-22 ENCOUNTER — Encounter: Payer: Self-pay | Admitting: Gastroenterology

## 2019-08-24 ENCOUNTER — Inpatient Hospital Stay: Payer: Medicare HMO

## 2019-08-24 ENCOUNTER — Other Ambulatory Visit: Payer: Self-pay

## 2019-08-24 VITALS — BP 114/60 | HR 82 | Temp 98.0°F | Resp 18

## 2019-08-24 DIAGNOSIS — K7581 Nonalcoholic steatohepatitis (NASH): Secondary | ICD-10-CM | POA: Diagnosis not present

## 2019-08-24 DIAGNOSIS — K5520 Angiodysplasia of colon without hemorrhage: Secondary | ICD-10-CM | POA: Diagnosis not present

## 2019-08-24 DIAGNOSIS — E1136 Type 2 diabetes mellitus with diabetic cataract: Secondary | ICD-10-CM | POA: Diagnosis not present

## 2019-08-24 DIAGNOSIS — Z853 Personal history of malignant neoplasm of breast: Secondary | ICD-10-CM | POA: Diagnosis not present

## 2019-08-24 DIAGNOSIS — E785 Hyperlipidemia, unspecified: Secondary | ICD-10-CM | POA: Diagnosis not present

## 2019-08-24 DIAGNOSIS — I8510 Secondary esophageal varices without bleeding: Secondary | ICD-10-CM | POA: Diagnosis not present

## 2019-08-24 DIAGNOSIS — K7030 Alcoholic cirrhosis of liver without ascites: Secondary | ICD-10-CM | POA: Diagnosis not present

## 2019-08-24 DIAGNOSIS — D500 Iron deficiency anemia secondary to blood loss (chronic): Secondary | ICD-10-CM

## 2019-08-24 DIAGNOSIS — I872 Venous insufficiency (chronic) (peripheral): Secondary | ICD-10-CM | POA: Diagnosis not present

## 2019-08-24 DIAGNOSIS — D509 Iron deficiency anemia, unspecified: Secondary | ICD-10-CM | POA: Diagnosis not present

## 2019-08-24 MED ORDER — FAMOTIDINE 20 MG PO TABS: 20.0000 mg | ORAL_TABLET | Freq: Once | ORAL | Status: DC

## 2019-08-24 MED ORDER — ACETAMINOPHEN 325 MG PO TABS
ORAL_TABLET | ORAL | Status: AC
  Filled 2019-08-24: qty 2

## 2019-08-24 MED ORDER — LORATADINE 10 MG PO TABS
10.0000 mg | ORAL_TABLET | Freq: Every day | ORAL | Status: DC
  Administered 2019-08-24: 10 mg via ORAL

## 2019-08-24 MED ORDER — SODIUM CHLORIDE 0.9 % IV SOLN
Freq: Once | INTRAVENOUS | Status: AC
  Filled 2019-08-24: qty 250

## 2019-08-24 MED ORDER — DIPHENHYDRAMINE HCL 25 MG PO CAPS: 50.0000 mg | ORAL_CAPSULE | Freq: Once | ORAL | Status: DC

## 2019-08-24 MED ORDER — SODIUM CHLORIDE 0.9 % IV SOLN
200.0000 mg | Freq: Once | INTRAVENOUS | Status: AC
  Administered 2019-08-24: 200 mg via INTRAVENOUS
  Filled 2019-08-24: qty 10

## 2019-08-24 MED ORDER — ACETAMINOPHEN 325 MG PO TABS
650.0000 mg | ORAL_TABLET | Freq: Once | ORAL | Status: AC
  Administered 2019-08-24: 650 mg via ORAL

## 2019-08-24 MED ORDER — LORATADINE 10 MG PO TABS
ORAL_TABLET | ORAL | Status: AC
  Filled 2019-08-24: qty 1

## 2019-08-24 NOTE — Patient Instructions (Signed)

## 2019-08-30 DIAGNOSIS — R6889 Other general symptoms and signs: Secondary | ICD-10-CM | POA: Diagnosis not present

## 2019-08-30 DIAGNOSIS — K7030 Alcoholic cirrhosis of liver without ascites: Secondary | ICD-10-CM | POA: Diagnosis not present

## 2019-08-30 DIAGNOSIS — K7031 Alcoholic cirrhosis of liver with ascites: Secondary | ICD-10-CM | POA: Diagnosis not present

## 2019-08-31 ENCOUNTER — Other Ambulatory Visit: Payer: Self-pay

## 2019-08-31 ENCOUNTER — Inpatient Hospital Stay: Payer: Medicare HMO

## 2019-08-31 VITALS — BP 111/50 | HR 70 | Temp 98.3°F | Resp 20

## 2019-08-31 DIAGNOSIS — K7581 Nonalcoholic steatohepatitis (NASH): Secondary | ICD-10-CM | POA: Diagnosis not present

## 2019-08-31 DIAGNOSIS — I8510 Secondary esophageal varices without bleeding: Secondary | ICD-10-CM | POA: Diagnosis not present

## 2019-08-31 DIAGNOSIS — E785 Hyperlipidemia, unspecified: Secondary | ICD-10-CM | POA: Diagnosis not present

## 2019-08-31 DIAGNOSIS — K5520 Angiodysplasia of colon without hemorrhage: Secondary | ICD-10-CM

## 2019-08-31 DIAGNOSIS — E1136 Type 2 diabetes mellitus with diabetic cataract: Secondary | ICD-10-CM | POA: Diagnosis not present

## 2019-08-31 DIAGNOSIS — I872 Venous insufficiency (chronic) (peripheral): Secondary | ICD-10-CM | POA: Diagnosis not present

## 2019-08-31 DIAGNOSIS — K7030 Alcoholic cirrhosis of liver without ascites: Secondary | ICD-10-CM | POA: Diagnosis not present

## 2019-08-31 DIAGNOSIS — D509 Iron deficiency anemia, unspecified: Secondary | ICD-10-CM | POA: Diagnosis not present

## 2019-08-31 DIAGNOSIS — D500 Iron deficiency anemia secondary to blood loss (chronic): Secondary | ICD-10-CM

## 2019-08-31 DIAGNOSIS — Z853 Personal history of malignant neoplasm of breast: Secondary | ICD-10-CM | POA: Diagnosis not present

## 2019-08-31 MED ORDER — LORATADINE 10 MG PO TABS
ORAL_TABLET | ORAL | Status: AC
  Filled 2019-08-31: qty 1

## 2019-08-31 MED ORDER — ACETAMINOPHEN 325 MG PO TABS
ORAL_TABLET | ORAL | Status: AC
  Filled 2019-08-31: qty 2

## 2019-08-31 MED ORDER — ACETAMINOPHEN 325 MG PO TABS
650.0000 mg | ORAL_TABLET | Freq: Once | ORAL | Status: AC
  Administered 2019-08-31: 650 mg via ORAL

## 2019-08-31 MED ORDER — SODIUM CHLORIDE 0.9 % IV SOLN
Freq: Once | INTRAVENOUS | Status: AC
  Filled 2019-08-31: qty 250

## 2019-08-31 MED ORDER — SODIUM CHLORIDE 0.9 % IV SOLN
200.0000 mg | Freq: Once | INTRAVENOUS | Status: AC
  Administered 2019-08-31: 200 mg via INTRAVENOUS
  Filled 2019-08-31: qty 200

## 2019-08-31 MED ORDER — LORATADINE 10 MG PO TABS
10.0000 mg | ORAL_TABLET | Freq: Every day | ORAL | Status: DC
  Administered 2019-08-31: 10 mg via ORAL

## 2019-08-31 NOTE — Patient Instructions (Signed)
Iron Sucrose injection What is this medicine? IRON SUCROSE (AHY ern SOO krohs) is an iron complex. Iron is used to make healthy red blood cells, which carry oxygen and nutrients throughout the body. This medicine is used to treat iron deficiency anemia in people with chronic kidney disease. This medicine may be used for other purposes; ask your health care provider or pharmacist if you have questions. COMMON BRAND NAME(S): Venofer What should I tell my health care provider before I take this medicine? They need to know if you have any of these conditions:  anemia not caused by low iron levels  heart disease  high levels of iron in the blood  kidney disease  liver disease  an unusual or allergic reaction to iron, other medicines, foods, dyes, or preservatives  pregnant or trying to get pregnant  breast-feeding How should I use this medicine? This medicine is for infusion into a vein. It is given by a health care professional in a hospital or clinic setting. Talk to your pediatrician regarding the use of this medicine in children. While this drug may be prescribed for children as young as 2 years for selected conditions, precautions do apply. Overdosage: If you think you have taken too much of this medicine contact a poison control center or emergency room at once. NOTE: This medicine is only for you. Do not share this medicine with others. What if I miss a dose? It is important not to miss your dose. Call your doctor or health care professional if you are unable to keep an appointment. What may interact with this medicine? Do not take this medicine with any of the following medications:  deferoxamine  dimercaprol  other iron products This medicine may also interact with the following medications:  chloramphenicol  deferasirox This list may not describe all possible interactions. Give your health care provider a list of all the medicines, herbs, non-prescription drugs, or  dietary supplements you use. Also tell them if you smoke, drink alcohol, or use illegal drugs. Some items may interact with your medicine. What should I watch for while using this medicine? Visit your doctor or healthcare professional regularly. Tell your doctor or healthcare professional if your symptoms do not start to get better or if they get worse. You may need blood work done while you are taking this medicine. You may need to follow a special diet. Talk to your doctor. Foods that contain iron include: whole grains/cereals, dried fruits, beans, or peas, leafy green vegetables, and organ meats (liver, kidney). What side effects may I notice from receiving this medicine? Side effects that you should report to your doctor or health care professional as soon as possible:  allergic reactions like skin rash, itching or hives, swelling of the face, lips, or tongue  breathing problems  changes in blood pressure  cough  fast, irregular heartbeat  feeling faint or lightheaded, falls  fever or chills  flushing, sweating, or hot feelings  joint or muscle aches/pains  seizures  swelling of the ankles or feet  unusually weak or tired Side effects that usually do not require medical attention (report to your doctor or health care professional if they continue or are bothersome):  diarrhea  feeling achy  headache  irritation at site where injected  nausea, vomiting  stomach upset  tiredness This list may not describe all possible side effects. Call your doctor for medical advice about side effects. You may report side effects to FDA at 1-800-FDA-1088. Where should I keep  my medicine? This drug is given in a hospital or clinic and will not be stored at home. NOTE: This sheet is a summary. It may not cover all possible information. If you have questions about this medicine, talk to your doctor, pharmacist, or health care provider.  2020 Elsevier/Gold Standard (2011-01-07  17:14:35)  Coronavirus (COVID-19) Are you at risk?  Are you at risk for the Coronavirus (COVID-19)?  To be considered HIGH RISK for Coronavirus (COVID-19), you have to meet the following criteria:  . Traveled to [place], [place], [place], [place] or [place]; or in the [place] to [place], [place], [place], or [place]; and have fever, cough, and shortness of breath within the last 2 weeks of travel OR . Been in close contact with a person diagnosed with COVID-19 within the last 2 weeks and have fever, cough, and shortness of breath . IF YOU DO NOT MEET THESE CRITERIA, YOU ARE CONSIDERED LOW RISK FOR COVID-19.  What to do if you are HIGH RISK for COVID-19?  Marland Kitchen If you are having a medical emergency, call 911. . Seek medical care right away. Before you go to a doctor's office, urgent care or emergency department, call ahead and tell them about your recent travel, contact with someone diagnosed with COVID-19, and your symptoms. You should receive instructions from your physician's office regarding next steps of care.  . When you arrive at healthcare provider, tell the healthcare staff immediately you have returned from visiting [place], [place], [place], [place] or [place]; or traveled in the [place] to [place], [place], [place], or [place]; in the last two weeks or you have been in close contact with a person diagnosed with COVID-19 in the last 2 weeks.   . Tell the health care staff about your symptoms: fever, cough and shortness of breath. . After you have been seen by a medical provider, you will be either: o Tested for (COVID-19) and discharged home on quarantine except to seek medical care if symptoms worsen, and asked to  - Stay home and avoid contact with others until you get your results (4-5 days)  - Avoid travel on public transportation if possible (such as bus, train, or airplane) or o Sent to the Emergency Department by EMS for evaluation, COVID-19 testing, and  possible admission depending on your condition and test results.  What to do if you are LOW RISK for COVID-19?  Reduce your risk of any infection by using the same precautions used for avoiding the common cold or flu:  Marland Kitchen Wash your hands often with soap and warm water for at least 20 seconds.  If soap and water are not readily available, use an alcohol-based hand sanitizer with at least 60% alcohol.  . If coughing or sneezing, cover your mouth and nose by coughing or sneezing into the elbow areas of your shirt or coat, into a tissue or into your sleeve (not your hands). . Avoid shaking hands with others and consider head nods or verbal greetings only. . Avoid touching your eyes, nose, or mouth with unwashed hands.  . Avoid close contact with people who are sick. . Avoid places or events with large numbers of people in one location, like concerts or sporting events. . Carefully consider travel plans you have or are making. . If you are planning any travel outside or inside the [place], visit the CDC's Travelers' Health webpage for the latest health notices. . If you have some symptoms but not  all symptoms, continue to monitor at home and seek medical attention if your symptoms worsen. . If you are having a medical emergency, call 911.   [place] / e-Visit: [email or web address]         [facility]: [facility]: [phone]                   [facility]: [phone]

## 2019-09-02 ENCOUNTER — Other Ambulatory Visit: Payer: Self-pay | Admitting: Family Medicine

## 2019-09-04 ENCOUNTER — Other Ambulatory Visit: Payer: Self-pay | Admitting: Family Medicine

## 2019-09-04 ENCOUNTER — Other Ambulatory Visit: Payer: Self-pay | Admitting: Gastroenterology

## 2019-09-04 MED ORDER — MUPIROCIN 2 % EX OINT: 1.0000 "application " | TOPICAL_OINTMENT | Freq: Two times a day (BID) | CUTANEOUS | 0 refills | Status: DC

## 2019-09-04 NOTE — Telephone Encounter (Signed)
Not on med list, please advise 

## 2019-09-04 NOTE — Telephone Encounter (Signed)
Done

## 2019-09-04 NOTE — Telephone Encounter (Signed)
I had to change the directions  Decline the one that specifies nasal and send the new one I wrote for topical use Thanks

## 2019-09-07 ENCOUNTER — Inpatient Hospital Stay: Payer: Medicare HMO

## 2019-09-07 ENCOUNTER — Other Ambulatory Visit: Payer: Self-pay

## 2019-09-07 VITALS — BP 120/58 | HR 70 | Resp 18

## 2019-09-07 DIAGNOSIS — K7581 Nonalcoholic steatohepatitis (NASH): Secondary | ICD-10-CM | POA: Diagnosis not present

## 2019-09-07 DIAGNOSIS — D500 Iron deficiency anemia secondary to blood loss (chronic): Secondary | ICD-10-CM

## 2019-09-07 DIAGNOSIS — I872 Venous insufficiency (chronic) (peripheral): Secondary | ICD-10-CM | POA: Diagnosis not present

## 2019-09-07 DIAGNOSIS — K7030 Alcoholic cirrhosis of liver without ascites: Secondary | ICD-10-CM | POA: Diagnosis not present

## 2019-09-07 DIAGNOSIS — K5520 Angiodysplasia of colon without hemorrhage: Secondary | ICD-10-CM | POA: Diagnosis not present

## 2019-09-07 DIAGNOSIS — I8510 Secondary esophageal varices without bleeding: Secondary | ICD-10-CM | POA: Diagnosis not present

## 2019-09-07 DIAGNOSIS — E785 Hyperlipidemia, unspecified: Secondary | ICD-10-CM | POA: Diagnosis not present

## 2019-09-07 DIAGNOSIS — Z853 Personal history of malignant neoplasm of breast: Secondary | ICD-10-CM | POA: Diagnosis not present

## 2019-09-07 DIAGNOSIS — D509 Iron deficiency anemia, unspecified: Secondary | ICD-10-CM | POA: Diagnosis not present

## 2019-09-07 DIAGNOSIS — E1136 Type 2 diabetes mellitus with diabetic cataract: Secondary | ICD-10-CM | POA: Diagnosis not present

## 2019-09-07 MED ORDER — LORATADINE 10 MG PO TABS
ORAL_TABLET | ORAL | Status: AC
  Filled 2019-09-07: qty 1

## 2019-09-07 MED ORDER — SODIUM CHLORIDE 0.9 % IV SOLN
Freq: Once | INTRAVENOUS | Status: AC
  Filled 2019-09-07: qty 250

## 2019-09-07 MED ORDER — SODIUM CHLORIDE 0.9 % IV SOLN
200.0000 mg | Freq: Once | INTRAVENOUS | Status: AC
  Administered 2019-09-07: 200 mg via INTRAVENOUS
  Filled 2019-09-07: qty 200

## 2019-09-07 MED ORDER — ACETAMINOPHEN 325 MG PO TABS
650.0000 mg | ORAL_TABLET | Freq: Once | ORAL | Status: AC
  Administered 2019-09-07: 650 mg via ORAL

## 2019-09-07 MED ORDER — ACETAMINOPHEN 325 MG PO TABS
ORAL_TABLET | ORAL | Status: AC
  Filled 2019-09-07: qty 2

## 2019-09-07 MED ORDER — LORATADINE 10 MG PO TABS
10.0000 mg | ORAL_TABLET | Freq: Every day | ORAL | Status: DC
  Administered 2019-09-07: 10 mg via ORAL

## 2019-09-07 NOTE — Patient Instructions (Signed)

## 2019-09-14 ENCOUNTER — Encounter (HOSPITAL_COMMUNITY): Payer: Self-pay

## 2019-09-14 ENCOUNTER — Ambulatory Visit (HOSPITAL_COMMUNITY)
Admission: EM | Admit: 2019-09-14 | Discharge: 2019-09-14 | Disposition: A | Discharge status: Home / Self Care | Payer: Medicare HMO | Attending: Urgent Care | Admitting: Urgent Care

## 2019-09-14 ENCOUNTER — Other Ambulatory Visit: Payer: Self-pay

## 2019-09-14 DIAGNOSIS — W5503XA Scratched by cat, initial encounter: Secondary | ICD-10-CM

## 2019-09-14 DIAGNOSIS — M79632 Pain in left forearm: Secondary | ICD-10-CM | POA: Diagnosis not present

## 2019-09-14 MED ORDER — AZITHROMYCIN 250 MG PO TABS
ORAL_TABLET | ORAL | 0 refills | Status: DC
Start: 2019-09-14 — End: 2019-11-09

## 2019-09-14 NOTE — ED Triage Notes (Signed)
Patient states a feral cat scratched her arm this evening. She is unsure if it also bit her. Does not want to pursue rabies treatment at this time.

## 2019-09-14 NOTE — ED Provider Notes (Signed)
[name]   MRN: [id number] DOB: April 18, 1950  Subjective:   Christine Barton is a 69 y.o. female presenting for suffering a cat scratch from a feral cat today.  Patient states that she has seen the cat out in the woods for the last 2 years but has never been able to catch it to get it fixed or vaccinated.  She cannot recall if it actually bit her but knows that it scratched her.  Patient states that she is gone to her PCP before for this kind of problem and has been given antibiotics.  No current facility-administered medications for this encounter.  Current Outpatient Medications:  .  albuterol (VENTOLIN HFA) 108 (90 Base) MCG/ACT inhaler, INHALE 2 PUFFS INTO THE LUNGS EVERY 4 (FOUR) HOURS AS NEEDED FOR WHEEZING OR SHORTNESS OF BREATH., Disp: 18 g, Rfl: 1 .  azithromycin (ZITHROMAX) 250 MG tablet, Start with 2 tablets today, then 1 daily thereafter., Disp: 6 tablet, Rfl: 0 .  furosemide (LASIX) 20 MG tablet, Take 1 tablet (20 mg total) by mouth daily., Disp: 90 tablet, Rfl: 1 .  lactulose (CHRONULAC) 10 GM/15ML solution, Take 15 mLs (10 g total) by mouth 3 (three) times daily., Disp: 236 mL, Rfl: 11 .  metFORMIN (GLUCOPHAGE XR) 500 MG 24 hr tablet, Take 1 tablet (500 mg total) by mouth daily with breakfast., Disp: 90 tablet, Rfl: 3 .  Multiple Vitamin (MULTIVITAMIN) tablet, Take 1 tablet by mouth daily., Disp: , Rfl:  .  mupirocin ointment (BACTROBAN) 2 %, Apply 1 application topically 2 (two) times daily. To affected areas, Disp: 22 g, Rfl: 0 .  nadolol (CORGARD) 20 MG tablet, Take 1 tablet (20 mg total) by mouth daily., Disp: 30 tablet, Rfl: 11 .  nystatin (MYCOSTATIN) 100000 UNIT/ML suspension, Take 5 mLs (500,000 Units total) by mouth 3 (three) times daily. Swish and swallow, Disp: 120 mL, Rfl: 0 .  pantoprazole (PROTONIX) 40 MG tablet, TAKE 1 TABLET (40 MG TOTAL) BY MOUTH 2 (TWO) TIMES DAILY BEFORE A MEAL., Disp: 180 tablet, Rfl: 1 .  Polyethyl Glycol-Propyl Glycol (SYSTANE OP),  Place 1 drop into the left eye daily as needed (dryness)., Disp: , Rfl:  .  rosuvastatin (CRESTOR) 5 MG tablet, Take 1 tablet (5 mg total) by mouth daily at 6 PM., Disp: 30 tablet, Rfl: 11 .  spironolactone (ALDACTONE) 25 MG tablet, Take 1 tablet (25 mg total) by mouth daily., Disp: 30 tablet, Rfl: 1 .  traZODone (DESYREL) 50 MG tablet, Take 0.5-1 tablets (25-50 mg total) by mouth at bedtime as needed for sleep., Disp: 30 tablet, Rfl: 3 .  XIFAXAN 550 MG TABS tablet, TAKE 1 TABLET BY MOUTH 2 TIMES DAILY., Disp: 60 tablet, Rfl: 3   Allergies  Allergen Reactions  . Oxycodone Nausea And Vomiting  . Glipizide Other (See Comments)    Stomach pain    Past Medical History:  Diagnosis Date  . Alcohol abuse, unspecified   . Breast cancer ([place]) 1998   Right  . Cataract    left eye  . Cervical spondylosis 2006   MRI  . Degeneration of cervical intervertebral disc 2006   MRI  . Diabetes mellitus without complication ([place])   . Diverticulosis of colon (without mention of hemorrhage)   . Hyperpotassemia   . Microscopic hematuria   . Mononeuritis of unspecified site   . Nonspecific abnormal results of liver function study   . Other abnormal glucose   . Other and unspecified hyperlipidemia    no  per pt  . Other chronic nonalcoholic liver disease   . Personal history of chemotherapy   . Personal history of malignant neoplasm of breast   . Personal history of radiation therapy   . Pneumothorax, acute    right, spontaneous  . Tobacco use disorder   . Unspecified vitamin D deficiency      Past Surgical History:  Procedure Laterality Date  . BIOPSY  07/27/2019   Procedure: BIOPSY;  Surgeon: Yetta Flock, MD;  Location: [facility];  Service: Gastroenterology;;  . BREAST BIOPSY  9/03   Right  . BREAST LUMPECTOMY Right 1998  . BREAST LUMPECTOMY  1998  . CHEST TUBE INSERTION  11/02/2014  . COLONOSCOPY    . COLONOSCOPY WITH PROPOFOL N/A 07/27/2019   Procedure: COLONOSCOPY WITH  PROPOFOL;  Surgeon: Yetta Flock, MD;  Location: [facility];  Service: Gastroenterology;  Laterality: N/A;  . ESOPHAGOGASTRODUODENOSCOPY (EGD) WITH PROPOFOL N/A 10/30/2018   Procedure: ESOPHAGOGASTRODUODENOSCOPY (EGD) WITH PROPOFOL;  Surgeon: Mauri Pole, MD;  Location: [facility];  Service: Endoscopy;  Laterality: N/A;  . ESOPHAGOGASTRODUODENOSCOPY (EGD) WITH PROPOFOL N/A 03/12/2019   Procedure: ESOPHAGOGASTRODUODENOSCOPY (EGD) WITH PROPOFOL;  Surgeon: Mauri Pole, MD;  Location: [facility];  Service: Endoscopy;  Laterality: N/A;  . ESOPHAGOGASTRODUODENOSCOPY (EGD) WITH PROPOFOL N/A 07/27/2019   Procedure: ESOPHAGOGASTRODUODENOSCOPY (EGD) WITH PROPOFOL;  Surgeon: Yetta Flock, MD;  Location: [facility];  Service: Gastroenterology;  Laterality: N/A;  . EYE SURGERY  02/2017   cataract extraction with lens implant-left  . HOT HEMOSTASIS N/A 07/27/2019   Procedure: HOT HEMOSTASIS (ARGON PLASMA COAGULATION/BICAP);  Surgeon: Yetta Flock, MD;  Location: [facility];  Service: Gastroenterology;  Laterality: N/A;  . MOUTH SURGERY    . POLYPECTOMY  07/27/2019   Procedure: POLYPECTOMY;  Surgeon: Yetta Flock, MD;  Location: [facility];  Service: Gastroenterology;;  . TUBAL LIGATION      Family History  Problem Relation Age of Onset  . Heart failure Father   . Heart attack Father   . Colon cancer Maternal Uncle   . Stroke Mother   . Esophageal cancer Neg Hx   . Rectal cancer Neg Hx   . Stomach cancer Neg Hx   . Pancreatic cancer Neg Hx     Social History   Tobacco Use  . Smoking status: Current Every Day Smoker    Packs/day: 1.00    Years: 53.00    Pack years: 53.00    Types: Cigarettes  . Smokeless tobacco: Never Used  . Tobacco comment: tobacco info given 04/27/2018  Substance Use Topics  . Alcohol use: Not Currently  . Drug use: No    ROS   Objective:   Vitals: BP (!) 113/55 (BP Location: Left Arm)   Pulse 72   Temp  97.8 F (36.6 C) (Oral)   Resp 16   SpO2 100%   Physical Exam Constitutional:      General: She is not in acute distress.    Appearance: Normal appearance. She is well-developed. She is not ill-appearing, toxic-appearing or diaphoretic.  HENT:     Head: Normocephalic and atraumatic.     Nose: Nose normal.     Mouth/Throat:     Mouth: Mucous membranes are moist.     Pharynx: Oropharynx is clear.  Eyes:     General: No scleral icterus.    Extraocular Movements: Extraocular movements intact.     Pupils: Pupils are equal, round, and reactive to light.  Cardiovascular:  Rate and Rhythm: Normal rate.  Pulmonary:     Effort: Pulmonary effort is normal.  Musculoskeletal:     Left forearm: No swelling, edema, deformity, lacerations, tenderness or bony tenderness.       Arms:  Skin:    General: Skin is warm and dry.  Neurological:     General: No focal deficit present.     Mental Status: She is alert and oriented to person, place, and time.  Psychiatric:        Mood and Affect: Mood normal.        Behavior: Behavior normal.        Thought Content: Thought content normal.        Judgment: Judgment normal.      Assessment and Plan :   PDMP not reviewed this encounter.  1. Pain of left forearm   2. Cat scratch     Counseled patient on possibility of rabies, general management of cat bites versus cat scratch.  Refused any consideration for rabies, cat bite infection. Patient insisted on having the antibiotic for cat scratch, states that she knows it is a 5-day course as she is gotten this from her PCP before.  Counseled against this but patient was very displeased with this. Rx sent to her pharmacy for azithromycin. Follow up with PCP. Counseled patient on potential for adverse effects with medications prescribed/recommended today, ER and return-to-clinic precautions discussed, patient verbalized understanding.    Jaynee Eagles, [place] 09/14/19 1923

## 2019-09-17 ENCOUNTER — Telehealth: Payer: Self-pay

## 2019-09-17 NOTE — Telephone Encounter (Signed)
Aware and saw UC note

## 2019-09-17 NOTE — Telephone Encounter (Signed)
[facility] Night - Client TELEPHONE ADVICE RECORD [facility] Patient Name: Christine Barton Gender: Female DOB: Oct 17, 1950 Age: 69 Y 75 M 2 D Return Phone Number: [phone] (Primary) Address: City/State/Zip: [place] [address] Client [place] Night - Client Client Site [facility], Christine Barton - MD Contact Type Call Who Is Calling Patient / Member / Family / Caregiver Call Type Triage / Clinical Relationship To Patient Self Return Phone Number [phone] (Primary) Chief Complaint Animal Bite Reason for Call Symptomatic / Request for Christine Barton states her cat clawed her arm. Translation No Nurse Assessment Nurse: Christine Bjork, RN, Christine Barton Date/Time (Eastern Time): 09/14/2019 5:19:45 PM Confirm and document reason for call. If symptomatic, describe symptoms. ---Caller states her cat clawed her arm-left arm-cat was angry-has claw punctures-2 punctures and 2 other punctures-not sure if from mouth or claws. Occurred approx. 15 min ago. Has the patient had close contact with a person known or suspected to have the novel coronavirus illness OR traveled / lives in area with major community spread (including international travel) in the last 14 days from the onset of symptoms? * If Asymptomatic, screen for exposure and travel within the last 14 days. ---No Does the patient have any new or worsening symptoms? ---Yes Will a triage be completed? ---Yes Related visit to physician within the last 2 weeks? ---No Does the PT have any chronic conditions? (i.e. diabetes, asthma, this includes High risk factors for pregnancy, etc.) ---Yes List chronic conditions. ---Anemia,DM Type 2, HTN. Is this a behavioral health or substance abuse call? ---No Guidelines Guideline Title Affirmed Question Affirmed Notes Nurse Date/Time (Eastern Time) Animal Bite [1] Any break in skin from BITE  (e.g., cut, puncture or scratch) AND [2] PET animial (e.g., dog, cat, or ferret) at risk for RABIES (e.g., sick, stray, unprovoked bite, developing country) [place], Therapist, sports, Christine Barton 09/14/2019 5:22:55 PMPLEASE NOTE: All timestamps contained within this report are represented as [place] Standard Time. CONFIDENTIALTY NOTICE: This fax transmission is intended only for the addressee. It contains information that is legally privileged, confidential or otherwise protected from use or disclosure. If you are not the intended recipient, you are strictly prohibited from reviewing, disclosing, copying using or disseminating any of this information or taking any action in reliance on or regarding this information. If you have received this fax in error, please notify us immediately by telephone so that we can arrange for its return to [place]. Phone: [phone], Toll-Free: [phone], Fax: [phone] Page: 2 of 2 Call Id: [id number] [place]. Time Christine Barton Time) Disposition Final User 09/14/2019 5:24:01 PM Go to ED Now Yes [place]ay[place], RN, Christine Barton Caller Disagree/Comply Disagree Caller Understands Yes PreDisposition Call Doctor Care Advice Given Per Guideline GO TO ED NOW: * You need to be seen in the Emergency Department. * Go to the ED at ___________ [place] now. Drive carefully. CARE ADVICE given per Animal Bite (Adult) guideline. Comments User: Christine Mikes, RN Date/Time Christine Barton Time): 09/14/2019 5:24:49 PM Pt not sure if cat bit her with 2 of the puncture sites-states she will go somewhere to be seen-pt seems frustrated with nurse asking quick questions. Referrals GO TO FACILITY UNDECIDED

## 2019-09-20 ENCOUNTER — Other Ambulatory Visit: Payer: Self-pay | Admitting: Family Medicine

## 2019-09-20 NOTE — Telephone Encounter (Signed)
Last filled on 07/30/19 #120 mL with 0 refills, last OV was on 08/08/19, please advise

## 2019-10-01 ENCOUNTER — Ambulatory Visit: Payer: Medicare HMO | Admitting: Gastroenterology

## 2019-10-08 ENCOUNTER — Other Ambulatory Visit (INDEPENDENT_AMBULATORY_CARE_PROVIDER_SITE_OTHER): Payer: Medicare HMO

## 2019-10-08 DIAGNOSIS — D509 Iron deficiency anemia, unspecified: Secondary | ICD-10-CM

## 2019-10-08 DIAGNOSIS — K7460 Unspecified cirrhosis of liver: Secondary | ICD-10-CM

## 2019-10-08 DIAGNOSIS — K7581 Nonalcoholic steatohepatitis (NASH): Secondary | ICD-10-CM

## 2019-10-08 LAB — CBC WITH DIFFERENTIAL/PLATELET
Basophils Absolute: 0 10*3/uL (ref 0.0–0.1)
Basophils Relative: 0.5 % (ref 0.0–3.0)
Eosinophils Absolute: 0.1 10*3/uL (ref 0.0–0.7)
Eosinophils Relative: 0.8 % (ref 0.0–5.0)
HCT: 33.6 % — ABNORMAL LOW (ref 36.0–46.0)
Hemoglobin: 11.4 g/dL — ABNORMAL LOW (ref 12.0–15.0)
Lymphocytes Relative: 18 % (ref 12.0–46.0)
Lymphs Abs: 1.4 10*3/uL (ref 0.7–4.0)
MCHC: 33.9 g/dL (ref 30.0–36.0)
MCV: 88.4 fl (ref 78.0–100.0)
Monocytes Absolute: 0.4 10*3/uL (ref 0.1–1.0)
Monocytes Relative: 5.8 % (ref 3.0–12.0)
Neutro Abs: 5.6 10*3/uL (ref 1.4–7.7)
Neutrophils Relative %: 74.9 % (ref 43.0–77.0)
Platelets: 118 10*3/uL — ABNORMAL LOW (ref 150.0–400.0)
RBC: 3.8 Mil/uL — ABNORMAL LOW (ref 3.87–5.11)
RDW: 17.7 % — ABNORMAL HIGH (ref 11.5–15.5)
WBC: 7.5 10*3/uL (ref 4.0–10.5)

## 2019-10-08 LAB — PROTIME-INR
INR: 1.2 ratio — ABNORMAL HIGH (ref 0.8–1.0)
Prothrombin Time: 13.9 s — ABNORMAL HIGH (ref 9.6–13.1)

## 2019-10-08 LAB — VITAMIN B12: Vitamin B-12: 1010 pg/mL — ABNORMAL HIGH (ref 211–911)

## 2019-10-08 LAB — COMPREHENSIVE METABOLIC PANEL
ALT: 14 U/L (ref 0–35)
AST: 23 U/L (ref 0–37)
Albumin: 4.3 g/dL (ref 3.5–5.2)
Alkaline Phosphatase: 122 U/L — ABNORMAL HIGH (ref 39–117)
BUN: 50 mg/dL — ABNORMAL HIGH (ref 6–23)
CO2: 26 mEq/L (ref 19–32)
Calcium: 9.8 mg/dL (ref 8.4–10.5)
Chloride: 100 mEq/L (ref 96–112)
Creatinine, Ser: 0.87 mg/dL (ref 0.40–1.20)
GFR: 64.59 mL/min (ref >60.00)
Glucose, Bld: 208 mg/dL — ABNORMAL HIGH (ref 70–99)
Potassium: 4.3 mEq/L (ref 3.5–5.1)
Sodium: 135 mEq/L (ref 135–145)
Total Bilirubin: 0.5 mg/dL (ref 0.2–1.2)
Total Protein: 7.2 g/dL (ref 6.0–8.3)

## 2019-10-08 LAB — FOLATE: Folate: >24.8 ng/mL (ref >5.9)

## 2019-10-08 LAB — IBC + FERRITIN
Ferritin: 30.1 ng/mL (ref 10.0–291.0)
Iron: 43 ug/dL (ref 42–145)
Saturation Ratios: 8 % — ABNORMAL LOW (ref 20.0–50.0)
Transferrin: 383 mg/dL — ABNORMAL HIGH (ref 212.0–360.0)

## 2019-10-11 ENCOUNTER — Telehealth: Payer: Self-pay | Admitting: Gastroenterology

## 2019-10-11 NOTE — Telephone Encounter (Signed)
Patient notified that MD has not reviewed labs yet. We will call with report as soon as they are reviewed. She is aware her hgb is better.

## 2019-10-11 NOTE — Telephone Encounter (Signed)
Ok, please check result note thank you.

## 2019-10-12 ENCOUNTER — Other Ambulatory Visit: Payer: Self-pay

## 2019-10-12 DIAGNOSIS — K7460 Unspecified cirrhosis of liver: Secondary | ICD-10-CM

## 2019-10-12 DIAGNOSIS — D509 Iron deficiency anemia, unspecified: Secondary | ICD-10-CM

## 2019-10-12 DIAGNOSIS — K7581 Nonalcoholic steatohepatitis (NASH): Secondary | ICD-10-CM

## 2019-10-12 NOTE — Telephone Encounter (Signed)
See result note for more information.

## 2019-10-24 ENCOUNTER — Other Ambulatory Visit: Payer: Self-pay | Admitting: Family Medicine

## 2019-10-24 ENCOUNTER — Other Ambulatory Visit (INDEPENDENT_AMBULATORY_CARE_PROVIDER_SITE_OTHER): Payer: Medicare HMO

## 2019-10-24 ENCOUNTER — Other Ambulatory Visit: Payer: Self-pay | Admitting: Gastroenterology

## 2019-10-24 DIAGNOSIS — K7581 Nonalcoholic steatohepatitis (NASH): Secondary | ICD-10-CM

## 2019-10-24 DIAGNOSIS — D509 Iron deficiency anemia, unspecified: Secondary | ICD-10-CM

## 2019-10-24 DIAGNOSIS — K7460 Unspecified cirrhosis of liver: Secondary | ICD-10-CM | POA: Diagnosis not present

## 2019-10-24 LAB — BASIC METABOLIC PANEL
BUN: 36 mg/dL — ABNORMAL HIGH (ref 6–23)
CO2: 29 mEq/L (ref 19–32)
Calcium: 10.3 mg/dL (ref 8.4–10.5)
Chloride: 101 mEq/L (ref 96–112)
Creatinine, Ser: 0.91 mg/dL (ref 0.40–1.20)
GFR: 61.32 mL/min (ref >60.00)
Glucose, Bld: 154 mg/dL — ABNORMAL HIGH (ref 70–99)
Potassium: 4.2 mEq/L (ref 3.5–5.1)
Sodium: 136 mEq/L (ref 135–145)

## 2019-10-24 LAB — CBC WITH DIFFERENTIAL/PLATELET
Basophils Absolute: 0 10*3/uL (ref 0.0–0.1)
Basophils Relative: 0.6 % (ref 0.0–3.0)
Eosinophils Absolute: 0.1 10*3/uL (ref 0.0–0.7)
Eosinophils Relative: 1.4 % (ref 0.0–5.0)
HCT: 36.6 % (ref 36.0–46.0)
Hemoglobin: 12.1 g/dL (ref 12.0–15.0)
Lymphocytes Relative: 26.6 % (ref 12.0–46.0)
Lymphs Abs: 2.1 10*3/uL (ref 0.7–4.0)
MCHC: 32.9 g/dL (ref 30.0–36.0)
MCV: 88.2 fl (ref 78.0–100.0)
Monocytes Absolute: 0.7 10*3/uL (ref 0.1–1.0)
Monocytes Relative: 8.6 % (ref 3.0–12.0)
Neutro Abs: 4.9 10*3/uL (ref 1.4–7.7)
Neutrophils Relative %: 62.8 % (ref 43.0–77.0)
Platelets: 149 10*3/uL — ABNORMAL LOW (ref 150.0–400.0)
RBC: 4.15 Mil/uL (ref 3.87–5.11)
RDW: 16.1 % — ABNORMAL HIGH (ref 11.5–15.5)
WBC: 7.8 10*3/uL (ref 4.0–10.5)

## 2019-10-24 NOTE — Telephone Encounter (Signed)
Last filled on 09/04/19 #22 g with 0 refills, please advise

## 2019-11-01 ENCOUNTER — Telehealth: Payer: Self-pay | Admitting: Family Medicine

## 2019-11-01 DIAGNOSIS — E785 Hyperlipidemia, unspecified: Secondary | ICD-10-CM

## 2019-11-01 DIAGNOSIS — E1169 Type 2 diabetes mellitus with other specified complication: Secondary | ICD-10-CM

## 2019-11-01 DIAGNOSIS — E118 Type 2 diabetes mellitus with unspecified complications: Secondary | ICD-10-CM

## 2019-11-01 DIAGNOSIS — Z0000 Encounter for general adult medical examination without abnormal findings: Secondary | ICD-10-CM

## 2019-11-01 DIAGNOSIS — D500 Iron deficiency anemia secondary to blood loss (chronic): Secondary | ICD-10-CM

## 2019-11-01 DIAGNOSIS — E559 Vitamin D deficiency, unspecified: Secondary | ICD-10-CM

## 2019-11-01 NOTE — Telephone Encounter (Signed)
-----   Message from Cloyd Stagers, RT sent at 10/22/2019  3:32 PM EDT ----- Regarding: Lab Orders for Friday 7.23.2021 Please place lab orders for Friday 7.23.2021, office visit for physical on Friday 7.30.2021 Thank you, Dyke Maes RT(R)

## 2019-11-02 ENCOUNTER — Other Ambulatory Visit (INDEPENDENT_AMBULATORY_CARE_PROVIDER_SITE_OTHER): Payer: Medicare HMO

## 2019-11-02 ENCOUNTER — Other Ambulatory Visit: Payer: Self-pay

## 2019-11-02 DIAGNOSIS — Z0000 Encounter for general adult medical examination without abnormal findings: Secondary | ICD-10-CM

## 2019-11-02 DIAGNOSIS — E785 Hyperlipidemia, unspecified: Secondary | ICD-10-CM

## 2019-11-02 DIAGNOSIS — E559 Vitamin D deficiency, unspecified: Secondary | ICD-10-CM

## 2019-11-02 DIAGNOSIS — E118 Type 2 diabetes mellitus with unspecified complications: Secondary | ICD-10-CM | POA: Diagnosis not present

## 2019-11-02 DIAGNOSIS — E1169 Type 2 diabetes mellitus with other specified complication: Secondary | ICD-10-CM | POA: Diagnosis not present

## 2019-11-02 LAB — VITAMIN D 25 HYDROXY (VIT D DEFICIENCY, FRACTURES): VITD: 64.13 ng/mL (ref 30.00–100.00)

## 2019-11-02 LAB — COMPREHENSIVE METABOLIC PANEL
ALT: 10 U/L (ref 0–35)
AST: 20 U/L (ref 0–37)
Albumin: 4.2 g/dL (ref 3.5–5.2)
Alkaline Phosphatase: 107 U/L (ref 39–117)
BUN: 42 mg/dL — ABNORMAL HIGH (ref 6–23)
CO2: 31 mEq/L (ref 19–32)
Calcium: 9.7 mg/dL (ref 8.4–10.5)
Chloride: 101 mEq/L (ref 96–112)
Creatinine, Ser: 0.89 mg/dL (ref 0.40–1.20)
GFR: 62.91 mL/min (ref >60.00)
Glucose, Bld: 128 mg/dL — ABNORMAL HIGH (ref 70–99)
Potassium: 4.4 mEq/L (ref 3.5–5.1)
Sodium: 137 mEq/L (ref 135–145)
Total Bilirubin: 0.5 mg/dL (ref 0.2–1.2)
Total Protein: 6.8 g/dL (ref 6.0–8.3)

## 2019-11-02 LAB — TSH: TSH: 3.95 u[IU]/mL (ref 0.35–4.50)

## 2019-11-02 LAB — LIPID PANEL
Cholesterol: 90 mg/dL (ref 0–200)
HDL: 37.7 mg/dL — ABNORMAL LOW (ref >39.00)
LDL Cholesterol: 34 mg/dL (ref 0–99)
NonHDL: 52.16
Total CHOL/HDL Ratio: 2
Triglycerides: 92 mg/dL (ref 0.0–149.0)
VLDL: 18.4 mg/dL (ref 0.0–40.0)

## 2019-11-02 LAB — HEMOGLOBIN A1C: Hgb A1c MFr Bld: 6.2 % (ref 4.6–6.5)

## 2019-11-02 NOTE — Addendum Note (Signed)
Addended by: Cloyd Stagers on: 11/02/2019 05:00 PM   Modules accepted: Orders

## 2019-11-03 LAB — MICROALBUMIN / CREATININE URINE RATIO
Creatinine, Urine: 44 mg/dL (ref 20–275)
Microalb Creat Ratio: 7 mcg/mg creat (ref <30)
Microalb, Ur: 0.3 mg/dL

## 2019-11-07 ENCOUNTER — Ambulatory Visit (INDEPENDENT_AMBULATORY_CARE_PROVIDER_SITE_OTHER): Payer: Medicare HMO

## 2019-11-07 ENCOUNTER — Other Ambulatory Visit: Payer: Self-pay

## 2019-11-07 ENCOUNTER — Telehealth: Payer: Self-pay | Admitting: Gastroenterology

## 2019-11-07 DIAGNOSIS — Z0000 Encounter for general adult medical examination without abnormal findings: Secondary | ICD-10-CM

## 2019-11-07 NOTE — Patient Instructions (Signed)
[name] , Thank you for taking time to come for your Medicare Wellness Visit. I appreciate your ongoing commitment to your health goals. Please review the following plan we discussed and let me know if I can assist you in the future.   Screening recommendations/referrals: Colonoscopy: Up to date, completed 07/27/2019, due 07/2022 Mammogram: Up to date, completed 12/04/2018, due 11/2019 Bone Density: Up to date, completed 11/03/2016, due 2-5 years Recommended yearly ophthalmology/optometry visit for glaucoma screening and checkup Recommended yearly dental visit for hygiene and checkup  Vaccinations: Influenza vaccine: Up to date, completed 12/04/2018, due 11/2019 Pneumococcal vaccine: Completed series Tdap vaccine: Up to date, completed 09/12/2013, due 2025 Shingles vaccine: due, check with you insurance regarding coverage   Covid-19:Completed series  Advanced directives: Advance directive discussed with you today. I have provided a copy for you to complete at home and have notarized. Once this is complete please bring a copy in to our office so we can scan it into your chart.  Conditions/risks identified: Diabetes  Next appointment: Follow up in one year for your annual wellness visit    Preventive Care 65 Years and Older, Female Preventive care refers to lifestyle choices and visits with your health care provider that can promote health and wellness. What does preventive care include?  A yearly physical exam. This is also called an annual well check.  Dental exams once or twice a year.  Routine eye exams. Ask your health care provider how often you should have your eyes checked.  Personal lifestyle choices, including:  Daily care of your teeth and gums.  Regular physical activity.  Eating a healthy diet.  Avoiding tobacco and drug use.  Limiting alcohol use.  Practicing safe sex.  Taking low-dose aspirin every day.  Taking vitamin and mineral supplements as recommended by  your health care provider. What happens during an annual well check? The services and screenings done by your health care provider during your annual well check will depend on your age, overall health, lifestyle risk factors, and family history of disease. Counseling  Your health care provider may ask you questions about your:  Alcohol use.  Tobacco use.  Drug use.  Emotional well-being.  Home and relationship well-being.  Sexual activity.  Eating habits.  History of falls.  Memory and ability to understand (cognition).  Work and work Statistician.  Reproductive health. Screening  You may have the following tests or measurements:  Height, weight, and BMI.  Blood pressure.  Lipid and cholesterol levels. These may be checked every 5 years, or more frequently if you are over 52 years old.  Skin check.  Lung cancer screening. You may have this screening every year starting at age 26 if you have a 30-pack-year history of smoking and currently smoke or have quit within the past 15 years.  Fecal occult blood test (FOBT) of the stool. You may have this test every year starting at age 34.  Flexible sigmoidoscopy or colonoscopy. You may have a sigmoidoscopy every 5 years or a colonoscopy every 10 years starting at age 39.  Hepatitis C blood test.  Hepatitis B blood test.  Sexually transmitted disease (STD) testing.  Diabetes screening. This is done by checking your blood sugar (glucose) after you have not eaten for a while (fasting). You may have this done every 1-3 years.  Bone density scan. This is done to screen for osteoporosis. You may have this done starting at age 19.  Mammogram. This may be done every 1-2 years.  Talk to your health care provider about how often you should have regular mammograms. Talk with your health care provider about your test results, treatment options, and if necessary, the need for more tests. Vaccines  Your health care provider may  recommend certain vaccines, such as:  Influenza vaccine. This is recommended every year.  Tetanus, diphtheria, and acellular pertussis (Tdap, Td) vaccine. You may need a Td booster every 10 years.  Zoster vaccine. You may need this after age 67.  Pneumococcal 13-valent conjugate (PCV13) vaccine. One dose is recommended after age 36.  Pneumococcal polysaccharide (PPSV23) vaccine. One dose is recommended after age 69. Talk to your health care provider about which screenings and vaccines you need and how often you need them. This information is not intended to replace advice given to you by your health care provider. Make sure you discuss any questions you have with your health care provider. Document Released: 04/25/2015 Document Revised: 12/17/2015 Document Reviewed: 01/28/2015 Elsevier Interactive Patient Education  2017 [place] Prevention in the Home Falls can cause injuries. They can happen to people of all ages. There are many things you can do to make your home safe and to help prevent falls. What can I do on the outside of my home?  Regularly fix the edges of walkways and driveways and fix any cracks.  Remove anything that might make you trip as you walk through a door, such as a raised step or threshold.  Trim any bushes or trees on the path to your home.  Use bright outdoor lighting.  Clear any walking paths of anything that might make someone trip, such as rocks or tools.  Regularly check to see if handrails are loose or broken. Make sure that both sides of any steps have handrails.  Any raised decks and porches should have guardrails on the edges.  Have any leaves, snow, or ice cleared regularly.  Use sand or salt on walking paths during winter.  Clean up any spills in your garage right away. This includes oil or grease spills. What can I do in the bathroom?  Use night lights.  Install grab bars by the toilet and in the tub and shower. Do not use towel  bars as grab bars.  Use non-skid mats or decals in the tub or shower.  If you need to sit down in the shower, use a plastic, non-slip stool.  Keep the floor dry. Clean up any water that spills on the floor as soon as it happens.  Remove soap buildup in the tub or shower regularly.  Attach bath mats securely with double-sided non-slip rug tape.  Do not have throw rugs and other things on the floor that can make you trip. What can I do in the bedroom?  Use night lights.  Make sure that you have a light by your bed that is easy to reach.  Do not use any sheets or blankets that are too big for your bed. They should not hang down onto the floor.  Have a firm chair that has side arms. You can use this for support while you get dressed.  Do not have throw rugs and other things on the floor that can make you trip. What can I do in the kitchen?  Clean up any spills right away.  Avoid walking on wet floors.  Keep items that you use a lot in easy-to-reach places.  If you need to reach something above you, use a strong step stool  that has a grab bar.  Keep electrical cords out of the way.  Do not use floor polish or wax that makes floors slippery. If you must use wax, use non-skid floor wax.  Do not have throw rugs and other things on the floor that can make you trip. What can I do with my stairs?  Do not leave any items on the stairs.  Make sure that there are handrails on both sides of the stairs and use them. Fix handrails that are broken or loose. Make sure that handrails are as long as the stairways.  Check any carpeting to make sure that it is firmly attached to the stairs. Fix any carpet that is loose or worn.  Avoid having throw rugs at the top or bottom of the stairs. If you do have throw rugs, attach them to the floor with carpet tape.  Make sure that you have a light switch at the top of the stairs and the bottom of the stairs. If you do not have them, ask someone to  add them for you. What else can I do to help prevent falls?  Wear shoes that:  Do not have high heels.  Have rubber bottoms.  Are comfortable and fit you well.  Are closed at the toe. Do not wear sandals.  If you use a stepladder:  Make sure that it is fully opened. Do not climb a closed stepladder.  Make sure that both sides of the stepladder are locked into place.  Ask someone to hold it for you, if possible.  Clearly mark and make sure that you can see:  Any grab bars or handrails.  First and last steps.  Where the edge of each step is.  Use tools that help you move around (mobility aids) if they are needed. These include:  Canes.  Walkers.  Scooters.  Crutches.  Turn on the lights when you go into a dark area. Replace any light bulbs as soon as they burn out.  Set up your furniture so you have a clear path. Avoid moving your furniture around.  If any of your floors are uneven, fix them.  If there are any pets around you, be aware of where they are.  Review your medicines with your doctor. Some medicines can make you feel dizzy. This can increase your chance of falling. Ask your doctor what other things that you can do to help prevent falls. This information is not intended to replace advice given to you by your health care provider. Make sure you discuss any questions you have with your health care provider. Document Released: 01/23/2009 Document Revised: 09/04/2015 Document Reviewed: 05/03/2014 Elsevier Interactive Patient Education  2017 [employer].

## 2019-11-07 NOTE — Progress Notes (Signed)
Subjective:   Christine Barton is a 69 y.o. female who presents for Medicare Annual (Subsequent) preventive examination.  Review of Systems: N/A      I connected with the patient today by telephone and verified that I am speaking with the correct person using two identifiers. Location patient: home Location nurse: work Persons participating in the virtual visit: patient, Marine scientist.   I discussed the limitations, risks, security and privacy concerns of performing an evaluation and management service by telephone and the availability of in person appointments. I also discussed with the patient that there may be a patient responsible charge related to this service. The patient expressed understanding and verbally consented to this telephonic visit.    Interactive audio and video telecommunications were attempted between this nurse and patient, however failed, due to patient having technical difficulties OR patient did not have access to video capability.  We continued and completed visit with audio only.     Cardiac Risk Factors include: advanced age (>29mn, >>57women);diabetes mellitus;smoking/ tobacco exposure     Objective:    Today's Vitals   There is no height or weight on file to calculate BMI.  Advanced Directives 11/07/2019 08/14/2019 07/25/2019 07/25/2019 03/12/2019 03/06/2019 11/06/2018  Does Patient Have a Medical Advance Directive? No Yes Yes Yes Yes Yes No  Type of Advance Directive - H[place]permontLiving will HMcGrewLiving will HYoung HarrisLiving will HConwayLiving will HIsabelaLiving will -  Does patient want to make changes to medical advance directive? - - No - Patient declined - - No - Patient declined -  Copy of HNuiqsutin Chart? - No - copy requested No - copy requested - No - copy requested No - copy requested -  Would patient like information on creating a  medical advance directive? Yes (MAU/Ambulatory/Procedural Areas - Information given) - - - - - -    Current Medications (verified) Outpatient Encounter Medications as of 11/07/2019  Medication Sig  . albuterol (VENTOLIN HFA) 108 (90 Base) MCG/ACT inhaler INHALE 2 PUFFS INTO THE LUNGS EVERY 4 (FOUR) HOURS AS NEEDED FOR WHEEZING OR SHORTNESS OF BREATH.  . furosemide (LASIX) 20 MG tablet Take 1 tablet (20 mg total) by mouth daily.  .Marland Kitchenlactulose (CHRONULAC) 10 GM/15ML solution Take 15 mLs (10 g total) by mouth 3 (three) times daily.  . metFORMIN (GLUCOPHAGE XR) 500 MG 24 hr tablet Take 1 tablet (500 mg total) by mouth daily with breakfast.  . Multiple Vitamin (MULTIVITAMIN) tablet Take 1 tablet by mouth daily.  . mupirocin ointment (BACTROBAN) 2 % APPLY 1 APPLICATION TOPICALLY 2 (TWO) TIMES DAILY. TO AFFECTED AREAS  . nadolol (CORGARD) 20 MG tablet TAKE 1 TABLET BY MOUTH EVERY DAY  . nystatin (MYCOSTATIN) 100000 UNIT/ML suspension TAKE 5 MLS (500,000 UNITS TOTAL) BY MOUTH 3 (THREE) TIMES DAILY. SWISH AND SWALLOW  . pantoprazole (PROTONIX) 40 MG tablet TAKE 1 TABLET (40 MG TOTAL) BY MOUTH 2 (TWO) TIMES DAILY BEFORE A MEAL.  .Vladimir FasterGlycol-Propyl Glycol (SYSTANE OP) Place 1 drop into the left eye daily as needed (dryness).  . rosuvastatin (CRESTOR) 5 MG tablet Take 1 tablet (5 mg total) by mouth daily at 6 PM.  . spironolactone (ALDACTONE) 25 MG tablet Take 1 tablet (25 mg total) by mouth daily.  . traZODone (DESYREL) 50 MG tablet Take 0.5-1 tablets (25-50 mg total) by mouth at bedtime as needed for sleep.  .Marland KitchenXIFAXAN 550 MG  TABS tablet TAKE 1 TABLET BY MOUTH 2 TIMES DAILY.  Marland Kitchen azithromycin (ZITHROMAX) 250 MG tablet Start with 2 tablets today, then 1 daily thereafter. (Patient not taking: Reported on 11/07/2019)   No facility-administered encounter medications on file as of 11/07/2019.    Allergies (verified) Oxycodone and Glipizide   History: Past Medical History:  Diagnosis Date  . Alcohol  abuse, unspecified   . Breast cancer ([place]) 1998   Right  . Cataract    left eye  . Cervical spondylosis 2006   MRI  . Degeneration of cervical intervertebral disc 2006   MRI  . Diabetes mellitus without complication ([place])   . Diverticulosis of colon (without mention of hemorrhage)   . Hyperpotassemia   . Microscopic hematuria   . Mononeuritis of unspecified site   . Nonspecific abnormal results of liver function study   . Other abnormal glucose   . Other and unspecified hyperlipidemia    no per pt  . Other chronic nonalcoholic liver disease   . Personal history of chemotherapy   . Personal history of malignant neoplasm of breast   . Personal history of radiation therapy   . Pneumothorax, acute    right, spontaneous  . Tobacco use disorder   . Unspecified vitamin D deficiency    Past Surgical History:  Procedure Laterality Date  . BIOPSY  07/27/2019   Procedure: BIOPSY;  Surgeon: Yetta Flock, MD;  Location: [facility];  Service: Gastroenterology;;  . BREAST BIOPSY  9/03   Right  . BREAST LUMPECTOMY Right 1998  . BREAST LUMPECTOMY  1998  . CHEST TUBE INSERTION  11/02/2014  . COLONOSCOPY    . COLONOSCOPY WITH PROPOFOL N/A 07/27/2019   Procedure: COLONOSCOPY WITH PROPOFOL;  Surgeon: Yetta Flock, MD;  Location: [facility];  Service: Gastroenterology;  Laterality: N/A;  . ESOPHAGOGASTRODUODENOSCOPY (EGD) WITH PROPOFOL N/A 10/30/2018   Procedure: ESOPHAGOGASTRODUODENOSCOPY (EGD) WITH PROPOFOL;  Surgeon: Mauri Pole, MD;  Location: [facility];  Service: Endoscopy;  Laterality: N/A;  . ESOPHAGOGASTRODUODENOSCOPY (EGD) WITH PROPOFOL N/A 03/12/2019   Procedure: ESOPHAGOGASTRODUODENOSCOPY (EGD) WITH PROPOFOL;  Surgeon: Mauri Pole, MD;  Location: [facility];  Service: Endoscopy;  Laterality: N/A;  . ESOPHAGOGASTRODUODENOSCOPY (EGD) WITH PROPOFOL N/A 07/27/2019   Procedure: ESOPHAGOGASTRODUODENOSCOPY (EGD) WITH PROPOFOL;  Surgeon: Yetta Flock, MD;  Location: [facility];  Service: Gastroenterology;  Laterality: N/A;  . EYE SURGERY  02/2017   cataract extraction with lens implant-left  . HOT HEMOSTASIS N/A 07/27/2019   Procedure: HOT HEMOSTASIS (ARGON PLASMA COAGULATION/BICAP);  Surgeon: Yetta Flock, MD;  Location: [facility];  Service: Gastroenterology;  Laterality: N/A;  . MOUTH SURGERY    . POLYPECTOMY  07/27/2019   Procedure: POLYPECTOMY;  Surgeon: Yetta Flock, MD;  Location: [facility];  Service: Gastroenterology;;  . TUBAL LIGATION     Family History  Problem Relation Age of Onset  . Heart failure Father   . Heart attack Father   . Colon cancer Maternal Uncle   . Stroke Mother   . Esophageal cancer Neg Hx   . Rectal cancer Neg Hx   . Stomach cancer Neg Hx   . Pancreatic cancer Neg Hx    Social History   Socioeconomic History  . Marital status: Single    Spouse name: Not on file  . Number of children: 1  . Years of education: Not on file  . Highest education level: Not on file  Occupational History  . Occupation: retired    Fish farm manager:  [employer]  Tobacco Use  . Smoking status: Current Every Day Smoker    Packs/day: 0.50    Years: 53.00    Pack years: 26.50    Types: Cigarettes  . Smokeless tobacco: Never Used  . Tobacco comment: tobacco info given 04/27/2018  Vaping Use  . Vaping Use: Never used  Substance and Sexual Activity  . Alcohol use: Not Currently  . Drug use: No  . Sexual activity: Not Currently  Other Topics Concern  . Not on file  Social History Narrative   Divorced      1 child      Works at [employer] Strain: [place]   . Difficulty of Paying Living Expenses: Not hard at all  Food Insecurity: No Food Insecurity  . Worried About Charity fundraiser in the Last Year: Never true  . Ran Out of Food in the Last Year: Never true  Transportation Needs: No Transportation Needs  . Lack of  Transportation (Medical): No  . Lack of Transportation (Non-Medical): No  Physical Activity: Insufficiently Active  . Days of Exercise per Week: 7 days  . Minutes of Exercise per Session: 10 min  Stress: Stress Concern Present  . Feeling of Stress : To some extent  Social Connections:   . Frequency of Communication with Friends and Family:   . Frequency of Social Gatherings with Friends and Family:   . Attends Religious Services:   . Active Member of Clubs or Organizations:   . Attends Archivist Meetings:   Marland Kitchen Marital Status:     Tobacco Counseling Ready to quit: Not Answered Counseling given: Not Answered Comment: tobacco info given 04/27/2018   Clinical Intake:  Pre-visit preparation completed: Yes  Pain : No/denies pain     Nutritional Risks: None Diabetes: Yes CBG done?: No Did pt. bring in CBG monitor from home?: No  How often do you need to have someone help you when you read instructions, pamphlets, or other written materials from your doctor or pharmacy?: 1 - Never What is the last grade level you completed in school?: GED  Diabetic: yes Nutrition Risk Assessment:  Has the patient had any N/V/D within the last 2 months?  No  Does the patient have any non-healing wounds?  No  Has the patient had any unintentional weight loss or weight gain?  No   Diabetes:  Is the patient diabetic?  Yes  If diabetic, was a CBG obtained today?  No  Did the patient bring in their glucometer from home?  No How often do you monitor your CBG's? When needed.   Financial Strains and Diabetes Management:  Are you having any financial strains with the device, your supplies or your medication? No .  Does the patient want to be seen by Chronic Care Management for management of their diabetes?  No  Would the patient like to be referred to a Nutritionist or for Diabetic Management?  No   Diabetic Exams:  Diabetic Eye Exam: Completed 12/11/2018 Diabetic Foot Exam:  Completed 11/15/2018   Interpreter Needed?: No  Information entered by :: CJohnson, LPN   Activities of Daily Living In your present state of health, do you have any difficulty performing the following activities: 11/07/2019 07/26/2019  Hearing? N N  Vision? Y -  Comment Patient states her vision is "bad" -  Difficulty concentrating or making decisions? N -  Walking or climbing stairs? N -  Dressing or bathing? N -  Doing errands, shopping? N -  Preparing Food and eating ? N -  Using the Toilet? N -  In the past six months, have you accidently leaked urine? Y -  Comment takes medication for this -  Do you have problems with loss of bowel control? N -  Managing your Medications? N -  Managing your Finances? N -  Housekeeping or managing your Housekeeping? N -  Some recent data might be hidden    Patient Care Team: Tower, [name], MD as PCP - General  Indicate any recent Medical Services you may have received from other than [facility] providers in the past year (date may be approximate).     Assessment:   This is a routine wellness examination for [facility].  Hearing/Vision screen  Hearing Screening   125Hz  250Hz  500Hz  1000Hz  2000Hz  3000Hz  4000Hz  6000Hz  8000Hz   Right ear:           Left ear:           Vision Screening Comments: Patient gets annual eye exams  Dietary issues and exercise activities discussed: Current Exercise Habits: Home exercise routine, Type of exercise: walking, Time (Minutes): 10, Frequency (Times/Week): 7, Weekly Exercise (Minutes/Week): 70, Intensity: Moderate, Exercise limited by: None identified  Goals    . Exercise 150 min/wk Moderate Activity     11/06/2018, would like to start exercising 3 days a week    . Increase physical activity     Starting 10/31/2017, I will continue to exercise for 60 minutes twice weekly.     . Patient Stated     11/06/2018, would like to regulate sleep    . Patient Stated     11/07/2019, I will continue to walk daily for  about 10 minutes.     . Quit Smoking     11/06/2018, would like to quit      Depression Screen PHQ 2/9 Scores 11/07/2019 11/06/2018 10/31/2017 10/26/2016  PHQ - 2 Score 6 0 0 0  PHQ- 9 Score 6 3 0 3    Fall Risk Fall Risk  11/07/2019 11/06/2018 10/31/2017 10/26/2016  Falls in the past year? 0 1 No Yes  Number falls in past yr: 0 1 - 1  Comment - has to do with liver - -  Injury with Fall? 0 0 - Yes  Risk for fall due to : Medication side effect History of fall(s);Medication side effect - -  Follow up Falls evaluation completed;Falls prevention discussed Falls evaluation completed;Falls prevention discussed - -    Any stairs in or around the home? Yes  If so, are there any without handrails? No  Home free of loose throw rugs in walkways, pet beds, electrical cords, etc? Yes  Adequate lighting in your home to reduce risk of falls? Yes   ASSISTIVE DEVICES UTILIZED TO PREVENT FALLS:  Life alert? No  Use of a cane, walker or w/c? No  Grab bars in the bathroom? No  Shower chair or bench in shower? No  Elevated toilet seat or a handicapped toilet? No   TIMED UP AND GO:  Was the test performed? N/A, telephonic visit .    Cognitive Function: MMSE - Mini Mental State Exam 11/07/2019 11/06/2018 10/31/2017  Orientation to time 5 4 5   Orientation to Place 5 5 5   Registration 3 3 3   Attention/ Calculation 5 2 0  Attention/Calculation-comments - missed r,o,w -  Recall 3 2 3  Recall-comments - missed penny -  Language- name 2 objects - 0 0  Language- repeat 1 1 1   Language- follow 3 step command - 0 3  Language- read & follow direction - 0 0  Write a sentence - 0 0  Copy design - 0 0  Total score - 17 20  Mini Cog  Mini-Cog screen was completed. Maximum score is 22. A value of 0 denotes this part of the MMSE was not completed or the patient failed this part of the Mini-Cog screening.       Immunizations Immunization History  Administered Date(s) Administered  . Fluad Quad(high  Dose 65+) 12/04/2018  . Hep A / Hep B 09/05/2018, 10/10/2018, 03/29/2019  . Influenza Split 03/10/2011  . Influenza Whole 02/11/2000, 02/06/2016  . Influenza,inj,Quad PF,6+ Mos 02/16/2017, 04/18/2018  . Influenza-Unspecified 02/11/2016  . PFIZER SARS-COV-2 Vaccination 05/21/2019, 06/16/2019  . Pneumococcal Conjugate-13 10/26/2016  . Pneumococcal Polysaccharide-23 06/28/2008, 11/07/2017  . Td 05/21/2002  . Tdap 09/12/2013  . Zoster 08/24/2011    TDAP status: Up to date Flu Vaccine status: Up to date Pneumococcal vaccine status: Up to date Covid-19 vaccine status: Completed vaccines  Qualifies for Shingles Vaccine? Yes   Zostavax completed Yes   Shingrix Completed?: No.    Education has been provided regarding the importance of this vaccine. Patient has been advised to call insurance company to determine out of pocket expense if they have not yet received this vaccine. Advised may also receive vaccine at local pharmacy or Health Dept. Verbalized acceptance and understanding.  Screening Tests Health Maintenance  Topic Date Due  . INFLUENZA VACCINE  11/11/2019  . FOOT EXAM  11/15/2019  . MAMMOGRAM  12/04/2019  . OPHTHALMOLOGY EXAM  12/11/2019  . HEMOGLOBIN A1C  05/04/2020  . URINE MICROALBUMIN  11/01/2020  . COLONOSCOPY  07/27/2022  . TETANUS/TDAP  09/13/2023  . DEXA SCAN  Completed  . COVID-19 Vaccine  Completed  . Hepatitis C Screening  Completed  . PNA vac Low Risk Adult  Completed    Health Maintenance  There are no preventive care reminders to display for this patient.  Colorectal cancer screening: Completed 07/27/2019. Repeat every 3 years Mammogram status: Completed 12/04/2018. Repeat every year Bone Density status: Completed 11/03/2016. Results reflect: Bone density results: NORMAL. Repeat every 2-5 years.  Lung Cancer Screening: (Low Dose CT Chest recommended if Age 37-80 years, 30 pack-year currently smoking OR have quit w/in 15years.) does qualify.   Lung Cancer  Screening Referral: completed 01/18/2019  Additional Screening:  Hepatitis C Screening: does qualify; Completed 12/08/2018  Vision Screening: Recommended annual ophthalmology exams for early detection of glaucoma and other disorders of the eye. Is the patient up to date with their annual eye exam?  Yes  Who is the provider or what is the name of the office in which the patient attends annual eye exams? Currently changing eye doctors to a specialist in [place] If pt is not established with a provider, would they like to be referred to a provider to establish care? No .   Dental Screening: Recommended annual dental exams for proper oral hygiene  Community Resource Referral / Chronic Care Management: CRR required this visit?  No   CCM required this visit?  No      Plan:     I have personally reviewed and noted the following in the patient's chart:   . Medical and social history . Use of alcohol, tobacco or illicit drugs  . Current medications and supplements .  Functional ability and status . Nutritional status . Physical activity . Advanced directives . List of other physicians . Hospitalizations, surgeries, and ER visits in previous 12 months . Vitals . Screenings to include cognitive, depression, and falls . Referrals and appointments  In addition, I have reviewed and discussed with patient certain preventive protocols, quality metrics, and best practice recommendations. A written personalized care plan for preventive services as well as general preventive health recommendations were provided to patient.    Due to this being a telephonic visit, the after visit summary with patients personalized plan was offered to patient via mail or my-chart.  Patient preferred to pick up at office at next visit.  Andrez Grime, LPN   08/20/7123

## 2019-11-07 NOTE — Progress Notes (Signed)
PCP notes:  Health Maintenance: No gaps noted   Abnormal Screenings: PHQ 9 score= 6   Patient concerns: none   Nurse concerns: none   Next PCP appt.: 11/09/2019 @ 2 pm

## 2019-11-09 ENCOUNTER — Ambulatory Visit (INDEPENDENT_AMBULATORY_CARE_PROVIDER_SITE_OTHER): Payer: Medicare HMO | Admitting: Family Medicine

## 2019-11-09 ENCOUNTER — Other Ambulatory Visit: Payer: Self-pay

## 2019-11-09 ENCOUNTER — Encounter: Payer: Self-pay | Admitting: Family Medicine

## 2019-11-09 VITALS — BP 114/58 | HR 74 | Temp 97.1°F | Ht 64.5 in | Wt 100.2 lb

## 2019-11-09 DIAGNOSIS — E46 Unspecified protein-calorie malnutrition: Secondary | ICD-10-CM

## 2019-11-09 DIAGNOSIS — E1169 Type 2 diabetes mellitus with other specified complication: Secondary | ICD-10-CM | POA: Diagnosis not present

## 2019-11-09 DIAGNOSIS — K7030 Alcoholic cirrhosis of liver without ascites: Secondary | ICD-10-CM

## 2019-11-09 DIAGNOSIS — E785 Hyperlipidemia, unspecified: Secondary | ICD-10-CM

## 2019-11-09 DIAGNOSIS — Z0000 Encounter for general adult medical examination without abnormal findings: Secondary | ICD-10-CM

## 2019-11-09 DIAGNOSIS — F17200 Nicotine dependence, unspecified, uncomplicated: Secondary | ICD-10-CM

## 2019-11-09 DIAGNOSIS — E559 Vitamin D deficiency, unspecified: Secondary | ICD-10-CM

## 2019-11-09 DIAGNOSIS — E118 Type 2 diabetes mellitus with unspecified complications: Secondary | ICD-10-CM | POA: Diagnosis not present

## 2019-11-09 MED ORDER — TRAZODONE HCL 50 MG PO TABS: 25.0000 mg | ORAL_TABLET | Freq: Every evening | ORAL | 3 refills | Status: DC | PRN

## 2019-11-09 NOTE — Progress Notes (Signed)
Subjective:    Patient ID: Christine Barton, female    DOB: 27-Mar-1951, 69 y.o.   MRN: [id number]  This visit occurred during the SARS-CoV-2 public health emergency.  Safety protocols were in place, including screening questions prior to the visit, additional usage of staff PPE, and extensive cleaning of exam room while observing appropriate contact time as indicated for disinfecting solutions.    HPI Here for health maintenance exam and to review chronic medical problems    Wt Readings from Last 3 Encounters:  11/09/19 100 lb 4 oz (45.5 kg)  08/21/19 103 lb 4 oz (46.8 kg)  08/17/19 100 lb 12.8 oz (45.7 kg)   16.94 kg/m   Feeling ok  Not doing a lot  Doing fairly good keeping up with meals and some snacks in between  Getting in protein is the biggest struggle  Boost 360 cal twice daily  Doubling up on protein    Had amw on 7/28 No gaps noted  Rev PHQ score  Mammogram 8/20- she will schedule/ got letter Self breast exam -no lumps   Eye exam a year ago   Colonoscopy 4/21 with 3 y recall   Is vaccinated for covid  Had zostavax 5/13  dexa 7/18 -nl  No falls  No fractures  Vit D level 64   BP Readings from Last 3 Encounters:  11/09/19 (!) 114/58  09/14/19 (!) 113/55  09/07/19 (!) 120/58   Pulse Readings from Last 3 Encounters:  11/09/19 74  09/14/19 72  09/07/19 70    Anemia Lab Results  Component Value Date   WBC 7.8 10/24/2019   HGB 12.1 10/24/2019   HCT 36.6 10/24/2019   MCV 88.2 10/24/2019   PLT 149.0 (L) 10/24/2019  h/o GI bleed  Much improved Taking nadolol for esoph varices Continues hematology f/u   DM2 Lab Results  Component Value Date   HGBA1C 6.2 11/02/2019   improved from 6.5 Taking metformin Very well controlled   Lab Results  Component Value Date   MICROALBUR 0.3 11/02/2019   MICROALBUR <0.7 04/24/2018      Lab Results  Component Value Date   CREATININE 0.89 11/02/2019   BUN 42 (H) 11/02/2019   NA 137 11/02/2019   K  4.4 11/02/2019   CL 101 11/02/2019   CO2 31 11/02/2019    Hyperlipidemia Lab Results  Component Value Date   CHOL 90 11/02/2019   CHOL 99 07/31/2019   CHOL 128 11/06/2018   Lab Results  Component Value Date   HDL 37.70 (L) 11/02/2019   HDL 37.60 (L) 07/31/2019   HDL 32.60 (L) 11/06/2018   Lab Results  Component Value Date   LDLCALC 34 11/02/2019   LDLCALC 41 07/31/2019   LDLCALC 70 11/06/2018   Lab Results  Component Value Date   TRIG 92.0 11/02/2019   TRIG 98.0 07/31/2019   TRIG 125.0 11/06/2018   Lab Results  Component Value Date   CHOLHDL 2 11/02/2019   CHOLHDL 3 07/31/2019   CHOLHDL 4 11/06/2018   Lab Results  Component Value Date   LDLDIRECT 83.0 10/31/2017   LDLDIRECT 145.1 10/10/2012   LDLDIRECT 151.4 07/21/2011   crestor and diet   HDL is low   Patient Active Problem List   Diagnosis Date Noted  . Abdominal pain 08/08/2019  . Protein-calorie malnutrition, severe 07/27/2019  . AVM (arteriovenous malformation) of colon   . Benign neoplasm of colon   . NASH (nonalcoholic steatohepatitis)   . Malnutrition ([place]) 07/25/2019  .  Symptomatic anemia 07/25/2019  . Tobacco abuse 07/25/2019  . Mass of soft tissue of face 03/06/2019  . HSV infection 02/23/2019  . Aortic atherosclerosis ([place]) 01/22/2019  . Coronary atherosclerosis 01/22/2019  . Encounter for screening for lung cancer 01/04/2019  . Low back pain 12/07/2018  . Venous insufficiency 11/15/2018  . Melena   . Esophageal varices without bleeding ([place]) 10/27/2018  . Anemia 10/23/2018  . Fatigue 10/23/2018  . Poor balance 05/30/2018  . Falls 05/30/2018  . Generalized weakness 05/30/2018  . Cirrhosis of liver ([place]) 04/24/2018  . Ascites 04/24/2018  . Gallstones 04/24/2018  . Heme positive stool 04/24/2018  . Screening mammogram, encounter for 11/07/2017  . Smoker 11/07/2017  . Screening examination for STD (sexually transmitted disease) 11/11/2016  . Welcome to Medicare preventive visit  10/26/2016  . Estrogen deficiency 10/26/2016  . Colon cancer screening 11/01/2014  . Elevated liver enzymes 11/01/2014  . Encounter for routine gynecological examination 09/12/2013  . Rapid heart beat 10/10/2012  . Heartburn 08/20/2011  . Routine general medical examination at a health care facility 07/21/2011  . Vitamin D deficiency 08/04/2009  . POSTMENOPAUSAL STATUS 08/04/2009  . Controlled diabetes mellitus type 2 with complications ([place]) 64/40/3474  . Hyperlipidemia associated with type 2 diabetes mellitus ([place]) 06/14/2008  . [place] DISEASE, CERVICAL 03/30/2007  . History of alcohol abuse 12/06/2006  . Neuropathy of both feet 12/06/2006  . DIVERTICULOSIS, COLON 12/06/2006  . Fatty liver 12/06/2006  . BREAST CANCER, HX OF 12/06/2006   Past Medical History:  Diagnosis Date  . Alcohol abuse, unspecified   . Breast cancer ([place]) 1998   Right  . Cataract    left eye  . Cervical spondylosis 2006   MRI  . Degeneration of cervical intervertebral disc 2006   MRI  . Diabetes mellitus without complication ([place])   . Diverticulosis of colon (without mention of hemorrhage)   . Hyperpotassemia   . Microscopic hematuria   . Mononeuritis of unspecified site   . Nonspecific abnormal results of liver function study   . Other abnormal glucose   . Other and unspecified hyperlipidemia    no per pt  . Other chronic nonalcoholic liver disease   . Personal history of chemotherapy   . Personal history of malignant neoplasm of breast   . Personal history of radiation therapy   . Pneumothorax, acute    right, spontaneous  . Tobacco use disorder   . Unspecified vitamin D deficiency    Past Surgical History:  Procedure Laterality Date  . BIOPSY  07/27/2019   Procedure: BIOPSY;  Surgeon: Yetta Flock, MD;  Location: [facility];  Service: Gastroenterology;;  . BREAST BIOPSY  9/03   Right  . BREAST LUMPECTOMY Right 1998  . BREAST LUMPECTOMY  1998  . CHEST TUBE INSERTION  11/02/2014    . COLONOSCOPY    . COLONOSCOPY WITH PROPOFOL N/A 07/27/2019   Procedure: COLONOSCOPY WITH PROPOFOL;  Surgeon: Yetta Flock, MD;  Location: [facility];  Service: Gastroenterology;  Laterality: N/A;  . ESOPHAGOGASTRODUODENOSCOPY (EGD) WITH PROPOFOL N/A 10/30/2018   Procedure: ESOPHAGOGASTRODUODENOSCOPY (EGD) WITH PROPOFOL;  Surgeon: Mauri Pole, MD;  Location: [facility];  Service: Endoscopy;  Laterality: N/A;  . ESOPHAGOGASTRODUODENOSCOPY (EGD) WITH PROPOFOL N/A 03/12/2019   Procedure: ESOPHAGOGASTRODUODENOSCOPY (EGD) WITH PROPOFOL;  Surgeon: Mauri Pole, MD;  Location: [facility];  Service: Endoscopy;  Laterality: N/A;  . ESOPHAGOGASTRODUODENOSCOPY (EGD) WITH PROPOFOL N/A 07/27/2019   Procedure: ESOPHAGOGASTRODUODENOSCOPY (EGD) WITH PROPOFOL;  Surgeon: Yetta Flock, MD;  Location: [facility];  Service: Gastroenterology;  Laterality: N/A;  . EYE SURGERY  02/2017   cataract extraction with lens implant-left  . HOT HEMOSTASIS N/A 07/27/2019   Procedure: HOT HEMOSTASIS (ARGON PLASMA COAGULATION/BICAP);  Surgeon: Yetta Flock, MD;  Location: [facility];  Service: Gastroenterology;  Laterality: N/A;  . MOUTH SURGERY    . POLYPECTOMY  07/27/2019   Procedure: POLYPECTOMY;  Surgeon: Yetta Flock, MD;  Location: [facility];  Service: Gastroenterology;;  . TUBAL LIGATION     Social History   Tobacco Use  . Smoking status: Current Every Day Smoker    Packs/day: 0.50    Years: 53.00    Pack years: 26.50    Types: Cigarettes  . Smokeless tobacco: Never Used  . Tobacco comment: tobacco info given 04/27/2018  Vaping Use  . Vaping Use: Never used  Substance Use Topics  . Alcohol use: Not Currently  . Drug use: No   Family History  Problem Relation Age of Onset  . Heart failure Father   . Heart attack Father   . Colon cancer Maternal Uncle   . Stroke Mother   . Esophageal cancer Neg Hx   . Rectal cancer Neg Hx   . Stomach cancer Neg Hx    . Pancreatic cancer Neg Hx    Allergies  Allergen Reactions  . Oxycodone Nausea And Vomiting  . Glipizide Other (See Comments)    Stomach pain   Current Outpatient Medications on File Prior to Visit  Medication Sig Dispense Refill  . albuterol (VENTOLIN HFA) 108 (90 Base) MCG/ACT inhaler INHALE 2 PUFFS INTO THE LUNGS EVERY 4 (FOUR) HOURS AS NEEDED FOR WHEEZING OR SHORTNESS OF BREATH. 18 g 1  . furosemide (LASIX) 20 MG tablet Take 1 tablet (20 mg total) by mouth daily. 90 tablet 1  . lactulose (CHRONULAC) 10 GM/15ML solution Take 15 mLs (10 g total) by mouth 3 (three) times daily. 236 mL 11  . metFORMIN (GLUCOPHAGE XR) 500 MG 24 hr tablet Take 1 tablet (500 mg total) by mouth daily with breakfast. 90 tablet 3  . Multiple Vitamin (MULTIVITAMIN) tablet Take 1 tablet by mouth daily.    . mupirocin ointment (BACTROBAN) 2 % APPLY 1 APPLICATION TOPICALLY 2 (TWO) TIMES DAILY. TO AFFECTED AREAS 22 g 2  . nadolol (CORGARD) 20 MG tablet TAKE 1 TABLET BY MOUTH EVERY DAY 90 tablet 3  . nystatin (MYCOSTATIN) 100000 UNIT/ML suspension TAKE 5 MLS (500,000 UNITS TOTAL) BY MOUTH 3 (THREE) TIMES DAILY. SWISH AND SWALLOW 120 mL 0  . pantoprazole (PROTONIX) 40 MG tablet TAKE 1 TABLET (40 MG TOTAL) BY MOUTH 2 (TWO) TIMES DAILY BEFORE A MEAL. 180 tablet 1  . Polyethyl Glycol-Propyl Glycol (SYSTANE OP) Place 1 drop into the left eye daily as needed (dryness).    . rosuvastatin (CRESTOR) 5 MG tablet Take 1 tablet (5 mg total) by mouth daily at 6 PM. 30 tablet 11  . spironolactone (ALDACTONE) 25 MG tablet Take 1 tablet (25 mg total) by mouth daily. 30 tablet 1  . XIFAXAN 550 MG TABS tablet TAKE 1 TABLET BY MOUTH 2 TIMES DAILY. 60 tablet 3   No current facility-administered medications on file prior to visit.     Review of Systems  Constitutional: Positive for fatigue. Negative for activity change, appetite change, fever and unexpected weight change.       Difficulty gaining weight   HENT: Negative for  congestion, ear pain, rhinorrhea, sinus pressure and sore throat.   Eyes:  Negative for pain, redness and visual disturbance.  Respiratory: Negative for cough, shortness of breath and wheezing.   Cardiovascular: Negative for chest pain and palpitations.  Gastrointestinal: Negative for abdominal pain, blood in stool, constipation and diarrhea.  Endocrine: Negative for polydipsia and polyuria.  Genitourinary: Negative for dysuria, frequency and urgency.  Musculoskeletal: Negative for arthralgias, back pain and myalgias.  Skin: Negative for pallor and rash.  Allergic/Immunologic: Negative for environmental allergies.  Neurological: Negative for dizziness, syncope and headaches.  Hematological: Negative for adenopathy. Does not bruise/bleed easily.  Psychiatric/Behavioral: Positive for dysphoric mood. Negative for decreased concentration. The patient is not nervous/anxious.        Gets down at times due to her health       Objective:   Physical Exam Constitutional:      General: She is not in acute distress.    Appearance: Normal appearance. She is well-developed. She is not ill-appearing or diaphoretic.     Comments: Underweight   HENT:     Head: Normocephalic and atraumatic.     Right Ear: Tympanic membrane, ear canal and external ear normal.     Left Ear: Tympanic membrane, ear canal and external ear normal.     Nose: Nose normal. No congestion.     Mouth/Throat:     Mouth: Mucous membranes are moist.     Pharynx: Oropharynx is clear. No posterior oropharyngeal erythema.  Eyes:     General: No scleral icterus.    Extraocular Movements: Extraocular movements intact.     Conjunctiva/sclera: Conjunctivae normal.     Pupils: Pupils are equal, round, and reactive to light.  Neck:     Thyroid: No thyromegaly.     Vascular: No carotid bruit or JVD.  Cardiovascular:     Rate and Rhythm: Normal rate and regular rhythm.     Pulses: Normal pulses.     Heart sounds: Normal heart sounds.  No gallop.   Pulmonary:     Effort: Pulmonary effort is normal. No respiratory distress.     Breath sounds: Normal breath sounds. No wheezing.     Comments: Good air exch Chest:     Chest wall: No tenderness.  Abdominal:     General: Bowel sounds are normal. There is no distension or abdominal bruit.     Palpations: Abdomen is soft. There is no mass.     Tenderness: There is no abdominal tenderness.     Hernia: No hernia is present.  Genitourinary:    Comments: Breast exam: No mass, nodules, thickening, tenderness, bulging, retraction, inflamation, nipple discharge or skin changes noted.  No axillary or clavicular LA.     Musculoskeletal:        General: No tenderness. Normal range of motion.     Cervical back: Normal range of motion and neck supple. No rigidity. No muscular tenderness.     Right lower leg: No edema.     Left lower leg: No edema.     Comments: No kyphosis   Lymphadenopathy:     Cervical: No cervical adenopathy.  Skin:    General: Skin is warm and dry.     Coloration: Skin is not pale.     Findings: No erythema or rash.     Comments: Some sks   Neurological:     Mental Status: She is alert. Mental status is at baseline.     Cranial Nerves: No cranial nerve deficit.     Motor: No abnormal muscle tone.  Coordination: Coordination normal.     Gait: Gait normal.     Deep Tendon Reflexes: Reflexes are normal and symmetric. Reflexes normal.  Psychiatric:        Mood and Affect: Mood normal.        Cognition and Memory: Cognition and memory normal.     Comments: Mentally sharp today but takes several seconds to respond to questions           Assessment & Plan:   Problem List Items Addressed This Visit      Digestive   Cirrhosis of liver ([place])    No clinical changes Continue GI/ liver f/u Still no alcohol intake        Endocrine   Hyperlipidemia associated with type 2 diabetes mellitus ([place])    Disc goals for lipids and reasons to control  them Rev last labs with pt Rev low sat fat diet in detail HDL is low-disc ways to inc it  LDL well controlled with crestor       Controlled diabetes mellitus type 2 with complications ([place])    Lab Results  Component Value Date   HGBA1C 6.2 11/02/2019   Well controlled  Lab Results  Component Value Date   MICROALBUR 0.3 11/02/2019   MICROALBUR <0.7 04/24/2018     Taking statin  Also metformin  Disc ways to inc protein calories        Other   Vitamin D deficiency    D level is 64 Vitamin D level is therapeutic with current supplementation Disc importance of this to bone and overall health Will continue 2000 iu daily      Routine general medical examination at a health care facility - Primary    Reviewed health habits including diet and exercise and skin cancer prevention Reviewed appropriate screening tests for age  Also reviewed health mt list, fam hx and immunization status , as well as social and family history   See HPI Labs reviewed  amw reviewed  Plans to schedule her mammogram  Nl dexa in 2018 with no falls or fx and D level is therapeutic Stable labs  Encouraged strongly to quit smoking       Smoker    Disc in detail risks of smoking and possible outcomes including copd, vascular/ heart disease, cancer , respiratory and sinus infections  Pt voices understanding She is currently not ready to quit       Malnutrition ([place])    Again discussed ways to get in more protein calories Adding peanut butter  Still working with a nutritionist

## 2019-11-09 NOTE — Patient Instructions (Addendum)
Don't forget to schedule your mammogram  Get your eye exam in when you can as well   Try to get vitamin D 2000 iu daily over the counter   Keep working on getting calories in  Take care of yourself   Keep thinking about quitting smoking

## 2019-11-11 NOTE — Assessment & Plan Note (Signed)
Lab Results  Component Value Date   HGBA1C 6.2 11/02/2019   Well controlled  Lab Results  Component Value Date   MICROALBUR 0.3 11/02/2019   MICROALBUR <0.7 04/24/2018     Taking statin  Also metformin  Disc ways to inc protein calories

## 2019-11-11 NOTE — Assessment & Plan Note (Signed)
Disc in detail risks of smoking and possible outcomes including copd, vascular/ heart disease, cancer , respiratory and sinus infections  Pt voices understanding She is currently not ready to quit

## 2019-11-11 NOTE — Assessment & Plan Note (Signed)
Disc goals for lipids and reasons to control them Rev last labs with pt Rev low sat fat diet in detail HDL is low-disc ways to inc it  LDL well controlled with crestor

## 2019-11-11 NOTE — Assessment & Plan Note (Signed)
D level is 64 Vitamin D level is therapeutic with current supplementation Disc importance of this to bone and overall health Will continue 2000 iu daily

## 2019-11-11 NOTE — Assessment & Plan Note (Signed)
Reviewed health habits including diet and exercise and skin cancer prevention Reviewed appropriate screening tests for age  Also reviewed health mt list, fam hx and immunization status , as well as social and family history   See HPI Labs reviewed  amw reviewed  Plans to schedule her mammogram  Nl dexa in 2018 with no falls or fx and D level is therapeutic Stable labs  Encouraged strongly to quit smoking

## 2019-11-11 NOTE — Assessment & Plan Note (Signed)
Again discussed ways to get in more protein calories Adding peanut butter  Still working with a nutritionist

## 2019-11-11 NOTE — Assessment & Plan Note (Signed)
No clinical changes Continue GI/ liver f/u Still no alcohol intake

## 2019-11-12 NOTE — Telephone Encounter (Signed)
At this time the appointments are booked to 01/14/20. Called the patient. No answer. Left her a voicemail of this. Put her on the schedule for 01/14/20. Will watch for a cancellation for a sooner appointment. Asked she call me if she is having any concerns she would like to ask                Dr [name] about in the meanwhile.

## 2019-11-12 NOTE — Progress Notes (Signed)
[facility] OFFICE PROGRESS NOTE  Tower, [name], MD [place] [address]  DIAGNOSIS: Anemia secondary to GI occult blood loss.   PRIOR THERAPY: Oral iron supplements. Discontinued due to intolerance (severe abdominal pain)  CURRENT THERAPY: IV iron infusions with venofer PRN. Last infusion on 09/07/19  INTERVAL HISTORY: Christine Barton 69 y.o. female returns to the clinic today for a follow-up visit.  The patient is feeling well today without any concerning complaints.  The patient was last seen in May 2021 and received weekly iron infusions with Venofer x4.  Since receiving her infusion she states that she could tell an improvement in her energy. The patient does not take oral iron supplements due to it causing severe abdominal pain per patient report.  The patient is followed closely by her gastroenterologist secondary to her cirrhosis.  The patient's iron deficiency anemia is likely secondary to some esophageal varices as well as AVMs which were noted on a recent colonoscopy and April 2021.  Today, the patient reports to feeling well. She sometimes reports light headedness upon standing. She reports her baseline dyspnea on exertion which she attributes to smoking cigarettes. She denies any other abnormal bleeding or bruising to her knowledge including epistaxis, gingival bleeding, hematemesis, hemoptysis, melena, hematochezia, or hematuria.  The patient is here today for evaluation and repeat CBC, ferritin, and iron studies.  MEDICAL HISTORY: Past Medical History:  Diagnosis Date  . Alcohol abuse, unspecified   . Breast cancer ([place]) 1998   Right  . Cataract    left eye  . Cervical spondylosis 2006   MRI  . Degeneration of cervical intervertebral disc 2006   MRI  . Diabetes mellitus without complication ([place])   . Diverticulosis of colon (without mention of hemorrhage)   . Hyperpotassemia   . Microscopic hematuria   . Mononeuritis of unspecified  site   . Nonspecific abnormal results of liver function study   . Other abnormal glucose   . Other and unspecified hyperlipidemia    no per pt  . Other chronic nonalcoholic liver disease   . Personal history of chemotherapy   . Personal history of malignant neoplasm of breast   . Personal history of radiation therapy   . Pneumothorax, acute    right, spontaneous  . Tobacco use disorder   . Unspecified vitamin D deficiency     ALLERGIES:  is allergic to oxycodone and glipizide.  MEDICATIONS:  Current Outpatient Medications  Medication Sig Dispense Refill  . albuterol (VENTOLIN HFA) 108 (90 Base) MCG/ACT inhaler INHALE 2 PUFFS INTO THE LUNGS EVERY 4 (FOUR) HOURS AS NEEDED FOR WHEEZING OR SHORTNESS OF BREATH. 18 g 1  . furosemide (LASIX) 20 MG tablet Take 1 tablet (20 mg total) by mouth daily. 90 tablet 1  . lactulose (CHRONULAC) 10 GM/15ML solution Take 15 mLs (10 g total) by mouth 3 (three) times daily. 236 mL 11  . metFORMIN (GLUCOPHAGE XR) 500 MG 24 hr tablet Take 1 tablet (500 mg total) by mouth daily with breakfast. 90 tablet 3  . Multiple Vitamin (MULTIVITAMIN) tablet Take 1 tablet by mouth daily.    . mupirocin ointment (BACTROBAN) 2 % APPLY 1 APPLICATION TOPICALLY 2 (TWO) TIMES DAILY. TO AFFECTED AREAS 22 g 2  . nadolol (CORGARD) 20 MG tablet TAKE 1 TABLET BY MOUTH EVERY DAY 90 tablet 3  . nystatin (MYCOSTATIN) 100000 UNIT/ML suspension TAKE 5 MLS (500,000 UNITS TOTAL) BY MOUTH 3 (THREE) TIMES DAILY. SWISH AND  SWALLOW 120 mL 0  . pantoprazole (PROTONIX) 40 MG tablet TAKE 1 TABLET (40 MG TOTAL) BY MOUTH 2 (TWO) TIMES DAILY BEFORE A MEAL. 180 tablet 1  . Polyethyl Glycol-Propyl Glycol (SYSTANE OP) Place 1 drop into the left eye daily as needed (dryness).    . rosuvastatin (CRESTOR) 5 MG tablet Take 1 tablet (5 mg total) by mouth daily at 6 PM. 30 tablet 11  . spironolactone (ALDACTONE) 25 MG tablet Take 1 tablet (25 mg total) by mouth daily. 30 tablet 1  . traZODone (DESYREL) 50  MG tablet Take 0.5-1 tablets (25-50 mg total) by mouth at bedtime as needed for sleep. 90 tablet 3  . XIFAXAN 550 MG TABS tablet TAKE 1 TABLET BY MOUTH 2 TIMES DAILY. 60 tablet 3   No current facility-administered medications for this visit.    SURGICAL HISTORY:  Past Surgical History:  Procedure Laterality Date  . BIOPSY  07/27/2019   Procedure: BIOPSY;  Surgeon: Yetta Flock, MD;  Location: [facility];  Service: Gastroenterology;;  . BREAST BIOPSY  9/03   Right  . BREAST LUMPECTOMY Right 1998  . BREAST LUMPECTOMY  1998  . CHEST TUBE INSERTION  11/02/2014  . COLONOSCOPY    . COLONOSCOPY WITH PROPOFOL N/A 07/27/2019   Procedure: COLONOSCOPY WITH PROPOFOL;  Surgeon: Yetta Flock, MD;  Location: [facility];  Service: Gastroenterology;  Laterality: N/A;  . ESOPHAGOGASTRODUODENOSCOPY (EGD) WITH PROPOFOL N/A 10/30/2018   Procedure: ESOPHAGOGASTRODUODENOSCOPY (EGD) WITH PROPOFOL;  Surgeon: Mauri Pole, MD;  Location: [facility];  Service: Endoscopy;  Laterality: N/A;  . ESOPHAGOGASTRODUODENOSCOPY (EGD) WITH PROPOFOL N/A 03/12/2019   Procedure: ESOPHAGOGASTRODUODENOSCOPY (EGD) WITH PROPOFOL;  Surgeon: Mauri Pole, MD;  Location: [facility];  Service: Endoscopy;  Laterality: N/A;  . ESOPHAGOGASTRODUODENOSCOPY (EGD) WITH PROPOFOL N/A 07/27/2019   Procedure: ESOPHAGOGASTRODUODENOSCOPY (EGD) WITH PROPOFOL;  Surgeon: Yetta Flock, MD;  Location: [facility];  Service: Gastroenterology;  Laterality: N/A;  . EYE SURGERY  02/2017   cataract extraction with lens implant-left  . HOT HEMOSTASIS N/A 07/27/2019   Procedure: HOT HEMOSTASIS (ARGON PLASMA COAGULATION/BICAP);  Surgeon: Yetta Flock, MD;  Location: [facility];  Service: Gastroenterology;  Laterality: N/A;  . MOUTH SURGERY    . POLYPECTOMY  07/27/2019   Procedure: POLYPECTOMY;  Surgeon: Yetta Flock, MD;  Location: [facility];  Service: Gastroenterology;;  . TUBAL LIGATION       REVIEW OF SYSTEMS:   Review of Systems  Constitutional: Positive for fatigue (improved from prior). Negative for appetite change, chills, fever and unexpected weight change.  HENT: Negative for mouth sores, nosebleeds, sore throat and trouble swallowing.   Eyes: Negative for eye problems and icterus.  Respiratory: Positive for baseline shortness of breath. Negative for cough, hemoptysis, and wheezing.   Cardiovascular: Negative for chest pain and leg swelling.  Gastrointestinal: Negative for abdominal pain, constipation, diarrhea, nausea and vomiting.  Genitourinary: Negative for bladder incontinence, difficulty urinating, dysuria, frequency and hematuria.   Musculoskeletal: Negative for back pain, gait problem, neck pain and neck stiffness.  Skin: Negative for itching and rash.  Neurological: Negative for dizziness, extremity weakness, gait problem, headaches, and seizures.  Hematological: Negative for adenopathy. Does not bruise/bleed easily.  Psychiatric/Behavioral: Negative for confusion, depression and sleep disturbance. The patient is not nervous/anxious.     PHYSICAL EXAMINATION:  Blood pressure 121/61, pulse 72, temperature 97.6 F (36.4 C), temperature source Temporal, resp. rate 19, height 5' 4.5" (1.638 m), weight 101 lb 14.4 oz (46.2 kg), SpO2 100 %.  ECOG PERFORMANCE STATUS: 1 - Symptomatic but completely ambulatory  Physical Exam  Constitutional: Oriented to person, place, and time and thin-appearing female and in no distress. HENT:  Head: Normocephalic and atraumatic.  Mouth/Throat: Oropharynx is clear and moist. No oropharyngeal exudate.  Eyes: Conjunctivae are normal. Right eye exhibits no discharge. Left eye exhibits no discharge. No scleral icterus.  Neck: Normal range of motion. Neck supple.  Cardiovascular: Normal rate, regular rhythm, normal heart sounds and intact distal pulses.   Pulmonary/Chest: Effort normal and breath sounds normal. No respiratory  distress. No wheezes. No rales.  Abdominal: Soft. Bowel sounds are normal. Exhibits no distension and no mass. There is no tenderness.  Musculoskeletal: Normal range of motion. Exhibits no edema.  Lymphadenopathy:    No cervical adenopathy.  Neurological: Alert and oriented to person, place, and time. Exhibits normal muscle tone. Gait normal. Coordination normal.  Skin: Skin is warm and dry. No rash noted. Not diaphoretic. No erythema. No pallor.  Psychiatric: Mood, memory and judgment normal.  Vitals reviewed.  LABORATORY DATA: Lab Results  Component Value Date   WBC 7.4 11/13/2019   HGB 10.7 (L) 11/13/2019   HCT 33.8 (L) 11/13/2019   MCV 87.8 11/13/2019   PLT 149 (L) 11/13/2019      Chemistry      Component Value Date/Time   NA 137 11/02/2019 0831   K 4.4 11/02/2019 0831   CL 101 11/02/2019 0831   CO2 31 11/02/2019 0831   BUN 42 (H) 11/02/2019 0831   CREATININE 0.89 11/02/2019 0831      Component Value Date/Time   CALCIUM 9.7 11/02/2019 0831   ALKPHOS 107 11/02/2019 0831   AST 20 11/02/2019 0831   ALT 10 11/02/2019 0831   BILITOT 0.5 11/02/2019 0831   BILITOT 0.7 05/14/2019 1207       RADIOGRAPHIC STUDIES:  No results found.   ASSESSMENT/PLAN:  This is a very pleasant 69 year old Caucasian female with iron deficiency anemia likely secondary to occult GI blood loss.  The patient had a repeat CBC, iron studies, and ferritin performed today.  The patient's CBC demonstrated a Hbg of 10.7. Her MCV is within normal limits.  The patient's iron studies and ferritin are still pending at this time.  I will reach out to the patient once I have the results of her ferritin and iron studies.  If she continues to have significant iron deficiency, I will arrange for more IV iron infusions with Venofer weekly x4.  We will plan on seeing the patient back for follow-up visit in 3 months for evaluation and repeat CBC, iron studies, and ferritin.  She will continue to follow with  her gastroenterologist regarding her cirrhosis.  Since she is unable to tolerate oral iron supplements, I encouraged her to eat an iron rich diet.  We discussed her tobacco use and the patient was strongly encouraged to quit smoking.   The patient was advised to call immediately if she has any concerning symptoms in the interval. The patient voices understanding of current disease status and treatment options and is in agreement with the current care plan. All questions were answered. The patient knows to call the clinic with any problems, questions or concerns. We can certainly see the patient much sooner if necessary       Orders Placed This Encounter  Procedures  . CBC with Differential (Cancer Center Only)    Standing Status:   Future    Standing Expiration Date:   11/12/2020  .  Ferritin    Standing Status:   Future    Standing Expiration Date:   11/12/2020  . Iron and TIBC    Standing Status:   Future    Standing Expiration Date:   11/12/2020     Rivky Clendenning L Genice Kimberlin, PA-C 11/13/19

## 2019-11-13 ENCOUNTER — Inpatient Hospital Stay: Payer: Medicare HMO

## 2019-11-13 ENCOUNTER — Inpatient Hospital Stay: Payer: Medicare HMO | Attending: Physician Assistant | Admitting: Physician Assistant

## 2019-11-13 ENCOUNTER — Other Ambulatory Visit: Payer: Self-pay

## 2019-11-13 VITALS — BP 121/61 | HR 72 | Temp 97.6°F | Resp 19 | Ht 64.5 in | Wt 101.9 lb

## 2019-11-13 DIAGNOSIS — E875 Hyperkalemia: Secondary | ICD-10-CM | POA: Insufficient documentation

## 2019-11-13 DIAGNOSIS — E119 Type 2 diabetes mellitus without complications: Secondary | ICD-10-CM | POA: Insufficient documentation

## 2019-11-13 DIAGNOSIS — F17210 Nicotine dependence, cigarettes, uncomplicated: Secondary | ICD-10-CM | POA: Insufficient documentation

## 2019-11-13 DIAGNOSIS — Z79899 Other long term (current) drug therapy: Secondary | ICD-10-CM | POA: Insufficient documentation

## 2019-11-13 DIAGNOSIS — Z9221 Personal history of antineoplastic chemotherapy: Secondary | ICD-10-CM | POA: Insufficient documentation

## 2019-11-13 DIAGNOSIS — F1010 Alcohol abuse, uncomplicated: Secondary | ICD-10-CM | POA: Insufficient documentation

## 2019-11-13 DIAGNOSIS — Z853 Personal history of malignant neoplasm of breast: Secondary | ICD-10-CM | POA: Insufficient documentation

## 2019-11-13 DIAGNOSIS — R0602 Shortness of breath: Secondary | ICD-10-CM | POA: Diagnosis not present

## 2019-11-13 DIAGNOSIS — D649 Anemia, unspecified: Secondary | ICD-10-CM

## 2019-11-13 DIAGNOSIS — M5030 Other cervical disc degeneration, unspecified cervical region: Secondary | ICD-10-CM | POA: Diagnosis not present

## 2019-11-13 DIAGNOSIS — D500 Iron deficiency anemia secondary to blood loss (chronic): Secondary | ICD-10-CM | POA: Insufficient documentation

## 2019-11-13 DIAGNOSIS — Z923 Personal history of irradiation: Secondary | ICD-10-CM | POA: Insufficient documentation

## 2019-11-13 LAB — CBC WITH DIFFERENTIAL (CANCER CENTER ONLY)
Abs Immature Granulocytes: 0.03 10*3/uL (ref 0.00–0.07)
Basophils Absolute: 0 10*3/uL (ref 0.0–0.1)
Basophils Relative: 0 %
Eosinophils Absolute: 0.1 10*3/uL (ref 0.0–0.5)
Eosinophils Relative: 2 %
HCT: 33.8 % — ABNORMAL LOW (ref 36.0–46.0)
Hemoglobin: 10.7 g/dL — ABNORMAL LOW (ref 12.0–15.0)
Immature Granulocytes: 0 %
Lymphocytes Relative: 29 %
Lymphs Abs: 2.1 10*3/uL (ref 0.7–4.0)
MCH: 27.8 pg (ref 26.0–34.0)
MCHC: 31.7 g/dL (ref 30.0–36.0)
MCV: 87.8 fL (ref 80.0–100.0)
Monocytes Absolute: 0.6 10*3/uL (ref 0.1–1.0)
Monocytes Relative: 8 %
Neutro Abs: 4.5 10*3/uL (ref 1.7–7.7)
Neutrophils Relative %: 61 %
Platelet Count: 149 10*3/uL — ABNORMAL LOW (ref 150–400)
RBC: 3.85 MIL/uL — ABNORMAL LOW (ref 3.87–5.11)
RDW: 14.7 % (ref 11.5–15.5)
WBC Count: 7.4 10*3/uL (ref 4.0–10.5)
nRBC: 0 % (ref 0.0–0.2)

## 2019-11-14 ENCOUNTER — Telehealth: Payer: Self-pay | Admitting: Physician Assistant

## 2019-11-14 ENCOUNTER — Telehealth: Payer: Self-pay | Admitting: Internal Medicine

## 2019-11-14 ENCOUNTER — Other Ambulatory Visit: Payer: Self-pay | Admitting: Physician Assistant

## 2019-11-14 LAB — IRON AND TIBC
Iron: 29 ug/dL — ABNORMAL LOW (ref 41–142)
Saturation Ratios: 5 % — ABNORMAL LOW (ref 21–57)
TIBC: 573 ug/dL — ABNORMAL HIGH (ref 236–444)
UIBC: 544 ug/dL — ABNORMAL HIGH (ref 120–384)

## 2019-11-14 LAB — FERRITIN: Ferritin: 20 ng/mL (ref 11–307)

## 2019-11-14 NOTE — Telephone Encounter (Signed)
Scheduled apt per 8/4 sch msg - unable to reach pt  - left message with appt date and time

## 2019-11-14 NOTE — Telephone Encounter (Signed)
I called the patient to let her know that she will need 4 more iron infusions. I sent a scheduling message and alerted her to be on the look out for a call from the scheduling team

## 2019-11-14 NOTE — Telephone Encounter (Signed)
Scheduled per los. Called and spoke with patient. Confirmed appt 

## 2019-11-16 DIAGNOSIS — H25011 Cortical age-related cataract, right eye: Secondary | ICD-10-CM | POA: Diagnosis not present

## 2019-11-16 DIAGNOSIS — H2511 Age-related nuclear cataract, right eye: Secondary | ICD-10-CM | POA: Diagnosis not present

## 2019-11-16 DIAGNOSIS — E113292 Type 2 diabetes mellitus with mild nonproliferative diabetic retinopathy without macular edema, left eye: Secondary | ICD-10-CM | POA: Diagnosis not present

## 2019-11-16 DIAGNOSIS — H353131 Nonexudative age-related macular degeneration, bilateral, early dry stage: Secondary | ICD-10-CM | POA: Diagnosis not present

## 2019-11-16 LAB — HM DIABETES EYE EXAM

## 2019-11-21 ENCOUNTER — Other Ambulatory Visit: Payer: Self-pay | Admitting: Family Medicine

## 2019-11-22 ENCOUNTER — Encounter: Payer: Self-pay | Admitting: Family Medicine

## 2019-11-23 ENCOUNTER — Inpatient Hospital Stay: Payer: Medicare HMO

## 2019-11-23 ENCOUNTER — Ambulatory Visit: Payer: Medicare HMO

## 2019-11-23 ENCOUNTER — Other Ambulatory Visit: Payer: Self-pay

## 2019-11-23 VITALS — BP 100/54 | HR 69 | Temp 97.9°F | Resp 18

## 2019-11-23 DIAGNOSIS — D500 Iron deficiency anemia secondary to blood loss (chronic): Secondary | ICD-10-CM | POA: Diagnosis not present

## 2019-11-23 DIAGNOSIS — Z853 Personal history of malignant neoplasm of breast: Secondary | ICD-10-CM | POA: Diagnosis not present

## 2019-11-23 DIAGNOSIS — M5030 Other cervical disc degeneration, unspecified cervical region: Secondary | ICD-10-CM | POA: Diagnosis not present

## 2019-11-23 DIAGNOSIS — F1010 Alcohol abuse, uncomplicated: Secondary | ICD-10-CM | POA: Diagnosis not present

## 2019-11-23 DIAGNOSIS — K5520 Angiodysplasia of colon without hemorrhage: Secondary | ICD-10-CM

## 2019-11-23 DIAGNOSIS — E875 Hyperkalemia: Secondary | ICD-10-CM | POA: Diagnosis not present

## 2019-11-23 DIAGNOSIS — R0602 Shortness of breath: Secondary | ICD-10-CM | POA: Diagnosis not present

## 2019-11-23 DIAGNOSIS — Z79899 Other long term (current) drug therapy: Secondary | ICD-10-CM | POA: Diagnosis not present

## 2019-11-23 DIAGNOSIS — E119 Type 2 diabetes mellitus without complications: Secondary | ICD-10-CM | POA: Diagnosis not present

## 2019-11-23 DIAGNOSIS — Z9221 Personal history of antineoplastic chemotherapy: Secondary | ICD-10-CM | POA: Diagnosis not present

## 2019-11-23 MED ORDER — LORATADINE 10 MG PO TABS: 10.0000 mg | ORAL_TABLET | Freq: Once | ORAL | Status: DC

## 2019-11-23 MED ORDER — SODIUM CHLORIDE 0.9 % IV SOLN
Freq: Once | INTRAVENOUS | Status: DC
  Filled 2019-11-23: qty 250

## 2019-11-23 MED ORDER — SODIUM CHLORIDE 0.9 % IV SOLN
200.0000 mg | Freq: Once | INTRAVENOUS | Status: AC
  Administered 2019-11-23: 200 mg via INTRAVENOUS
  Filled 2019-11-23: qty 10

## 2019-11-23 MED ORDER — ACETAMINOPHEN 325 MG PO TABS: 650.0000 mg | ORAL_TABLET | Freq: Once | ORAL | Status: DC

## 2019-11-23 MED ORDER — SODIUM CHLORIDE 0.9 % IV SOLN
Freq: Once | INTRAVENOUS | Status: AC
  Filled 2019-11-23: qty 250

## 2019-11-23 NOTE — Progress Notes (Signed)
Pt. Refused iron premeds. She tolerated the infusion without complaints.

## 2019-11-23 NOTE — Patient Instructions (Signed)

## 2019-11-29 ENCOUNTER — Telehealth: Payer: Self-pay | Admitting: Adult Health Nurse Practitioner

## 2019-11-29 NOTE — Telephone Encounter (Signed)
Scheduled [facility] Palliative visit for 12-04-19 at 2:00.

## 2019-11-30 ENCOUNTER — Ambulatory Visit: Payer: Medicare HMO

## 2019-11-30 ENCOUNTER — Inpatient Hospital Stay: Payer: Medicare HMO

## 2019-11-30 ENCOUNTER — Other Ambulatory Visit: Payer: Self-pay

## 2019-11-30 VITALS — BP 120/42 | HR 67 | Temp 97.6°F | Resp 18

## 2019-11-30 DIAGNOSIS — M5030 Other cervical disc degeneration, unspecified cervical region: Secondary | ICD-10-CM | POA: Diagnosis not present

## 2019-11-30 DIAGNOSIS — Z9221 Personal history of antineoplastic chemotherapy: Secondary | ICD-10-CM | POA: Diagnosis not present

## 2019-11-30 DIAGNOSIS — E875 Hyperkalemia: Secondary | ICD-10-CM | POA: Diagnosis not present

## 2019-11-30 DIAGNOSIS — F1010 Alcohol abuse, uncomplicated: Secondary | ICD-10-CM | POA: Diagnosis not present

## 2019-11-30 DIAGNOSIS — E119 Type 2 diabetes mellitus without complications: Secondary | ICD-10-CM | POA: Diagnosis not present

## 2019-11-30 DIAGNOSIS — R0602 Shortness of breath: Secondary | ICD-10-CM | POA: Diagnosis not present

## 2019-11-30 DIAGNOSIS — D500 Iron deficiency anemia secondary to blood loss (chronic): Secondary | ICD-10-CM

## 2019-11-30 DIAGNOSIS — Z79899 Other long term (current) drug therapy: Secondary | ICD-10-CM | POA: Diagnosis not present

## 2019-11-30 DIAGNOSIS — Z853 Personal history of malignant neoplasm of breast: Secondary | ICD-10-CM | POA: Diagnosis not present

## 2019-11-30 DIAGNOSIS — K5520 Angiodysplasia of colon without hemorrhage: Secondary | ICD-10-CM

## 2019-11-30 MED ORDER — SODIUM CHLORIDE 0.9 % IV SOLN
200.0000 mg | Freq: Once | INTRAVENOUS | Status: AC
  Administered 2019-11-30: 200 mg via INTRAVENOUS
  Filled 2019-11-30: qty 200

## 2019-11-30 MED ORDER — ACETAMINOPHEN 325 MG PO TABS: 650.0000 mg | ORAL_TABLET | Freq: Once | ORAL | Status: DC

## 2019-11-30 MED ORDER — LORATADINE 10 MG PO TABS: 10.0000 mg | ORAL_TABLET | Freq: Once | ORAL | Status: DC

## 2019-11-30 MED ORDER — SODIUM CHLORIDE 0.9 % IV SOLN
Freq: Once | INTRAVENOUS | Status: AC
  Filled 2019-11-30: qty 250

## 2019-12-04 ENCOUNTER — Other Ambulatory Visit: Payer: Medicare HMO | Admitting: Adult Health Nurse Practitioner

## 2019-12-04 ENCOUNTER — Other Ambulatory Visit: Payer: Self-pay

## 2019-12-04 DIAGNOSIS — Z515 Encounter for palliative care: Secondary | ICD-10-CM | POA: Diagnosis not present

## 2019-12-04 DIAGNOSIS — K7030 Alcoholic cirrhosis of liver without ascites: Secondary | ICD-10-CM

## 2019-12-04 NOTE — Progress Notes (Signed)
[facility] Consult Note Telephone: [phone]  Fax: [phone]  PATIENT NAME: Christine Barton DOB: [birth year] MRN: [id number]  PRIMARY CARE PROVIDER:   [name], MD  REFERRING PROVIDER:  [name], MD [place],  [place] [address]  RESPONSIBLE PARTY:   Self [phone]    RECOMMENDATIONS and PLAN:  1.  Advanced care planning.  Started going over MOST form.  Discussed financial and healthcare power of attorney.  Will mail a copy of healthcare power of attorney and living well to patient.  Attempted to answer patient's questions about planning for end-of-life.  This will be an ongoing discussion as questions arise.  Did discuss getting a lawyer to go over a will, assigning financial and healthcare power of attorney, and living will.  Left MOST form for patient to go over with family.  Patient had questions about what to expect with disease progression.  Attempted to answer her questions.  Also encouraged to talk with her hepatologist further about prognosis.  2.  Functional status.  Patient is very high functioning she lives by herself and takes care of her house and her cooking needs.  Requires no assistance with ADLs.  Walks without assistive devices.  Is continent of bowel and bladder.  3.  Nutritional status.  Patient does not eat much and does supplement with boost.  She has maintained her weight between 101 and 104 pounds over the past 6 months.  Encouraged to continue supplementation with boost and to stay hydrated.  4.  Abdominal discomfort.  Patient states having discomfort in RUQ.  States that is not necessarily painful but feels something there.  She is unsure if it is related to her liver or her lungs.  States that it comes and goes.  Has not noticed a pattern of what aggravates it.  Have encouraged her how to monitor and write down when the discomfort occurs and if she notices any activity or foods that  might be aggravating it.  Has not needed anything to relieve it.  States that this is just started 1 to 2 months ago.  Have encouraged her to reach out to her hepatologist if symptoms worsen.  She does have appointment with her hepatologist on November 24 and is scheduled for an MRI of her liver on September 20.  She also has appointment with GI specialist on October 4.  5.  Support.  Patient lives by herself and is her own responsible party.  Currently does not need help in the home.  She does have concerns about how she will pay for help in the home when she needs it as her disease progresses.  Has questions about how she can use her home, which is paid off, to help support any financial needs she needs for her health care in the future.  Unable to answer these questions and will have social worker reach out to her.  Gave her contact information for [employer] but she states that she lives in [place].  Will reach out to Education officer, museum for resources in [place].  Palliative will continue to monitor for symptom management/decline and make recommendations as needed.  Patient does need extra support right now as she is trying to plan legally and financially for her end of life.  Encouraged to call with questions and concerns and will have social worker involved for questions provider unable to answer.  Next appointment is in 5 weeks.  I spent 120 minutes providing this consultation,  from 1:30 to 3:30 including time spent with patient, chart review, provider coordination, documentation. More than 50% of the time in this consultation was spent coordinating communication.   HISTORY OF PRESENT ILLNESS:  [name] is a 69 y.o. year old female with multiple medical problems including cirrhosis of the liver, anemia, COPD, AVM of colon, esophageal varices, diverticulosis of colon, DM T2, h/o right breast cancer. Palliative Care was asked to help address goals of care.  Patient was  hospitalized 4/14 through 07/27/2019 for symptomatic anemia.  Did require a total of 3 units PRBC while in the hospital.  Patient is followed by Dr. Earlie Server for her anemia and does require iron infusions.  She is currently undergoing second round of 4 infusions.  She is unable to tolerate oral iron supplementation.  States that she is feeling stronger.  States that she does occasionally get short of breath mostly with activity but sometimes at rest.  Does state that she uses her albuterol inhaler about twice a day.  Currently not being followed by pulmonologist.  Does not require supplemental oxygen.  She does continue to smoke cigarettes.  Patient denies pain, increased shortness of breath or cough, chest pain, palpitations, fever, body aches, chills, N/V/D, constipation, edema, dysuria, hematuria, hematochezia, melena.  CODE STATUS: see above  PPS: 70% HOSPICE ELIGIBILITY/DIAGNOSIS: TBD  PHYSICAL EXAM:   General: NAD, frail appearing, thin Extremities: no edema, no joint deformities Skin: no rashes on exposed skin Neurological: Weakness but otherwise nonfocal  PAST MEDICAL HISTORY:  Past Medical History:  Diagnosis Date  . Alcohol abuse, unspecified   . Breast cancer ([place]) 1998   Right  . Cataract    left eye  . Cervical spondylosis 2006   MRI  . Degeneration of cervical intervertebral disc 2006   MRI  . Diabetes mellitus without complication ([place])   . Diverticulosis of colon (without mention of hemorrhage)   . Hyperpotassemia   . Microscopic hematuria   . Mononeuritis of unspecified site   . Nonspecific abnormal results of liver function study   . Other abnormal glucose   . Other and unspecified hyperlipidemia    no per pt  . Other chronic nonalcoholic liver disease   . Personal history of chemotherapy   . Personal history of malignant neoplasm of breast   . Personal history of radiation therapy   . Pneumothorax, acute    right, spontaneous  . Tobacco use disorder   .  Unspecified vitamin D deficiency     SOCIAL HX:  Social History   Tobacco Use  . Smoking status: Current Every Day Smoker    Packs/day: 0.50    Years: 53.00    Pack years: 26.50    Types: Cigarettes  . Smokeless tobacco: Never Used  . Tobacco comment: tobacco info given 04/27/2018  Substance Use Topics  . Alcohol use: Not Currently    ALLERGIES:  Allergies  Allergen Reactions  . Oxycodone Nausea And Vomiting  . Glipizide Other (See Comments)    Stomach pain     PERTINENT MEDICATIONS:  Outpatient Encounter Medications as of 12/04/2019  Medication Sig  . albuterol (VENTOLIN HFA) 108 (90 Base) MCG/ACT inhaler INHALE 2 PUFFS INTO THE LUNGS EVERY 4 (FOUR) HOURS AS NEEDED FOR WHEEZING OR SHORTNESS OF BREATH.  . furosemide (LASIX) 20 MG tablet Take 1 tablet (20 mg total) by mouth daily.  Marland Kitchen lactulose (CHRONULAC) 10 GM/15ML solution Take 15 mLs (10 g total) by  mouth 3 (three) times daily.  . metFORMIN (GLUCOPHAGE XR) 500 MG 24 hr tablet Take 1 tablet (500 mg total) by mouth daily with breakfast.  . Multiple Vitamin (MULTIVITAMIN) tablet Take 1 tablet by mouth daily.  . mupirocin ointment (BACTROBAN) 2 % APPLY 1 APPLICATION TOPICALLY 2 (TWO) TIMES DAILY. TO AFFECTED AREAS  . nadolol (CORGARD) 20 MG tablet TAKE 1 TABLET BY MOUTH EVERY DAY  . nystatin (MYCOSTATIN) 100000 UNIT/ML suspension TAKE 5 MLS (500,000 UNITS TOTAL) BY MOUTH 3 (THREE) TIMES DAILY. SWISH AND SWALLOW  . pantoprazole (PROTONIX) 40 MG tablet TAKE 1 TABLET (40 MG TOTAL) BY MOUTH 2 (TWO) TIMES DAILY BEFORE A MEAL.  [name] Glycol-Propyl Glycol (SYSTANE OP) Place 1 drop into the left eye daily as needed (dryness).  . rosuvastatin (CRESTOR) 5 MG tablet Take 1 tablet (5 mg total) by mouth daily at 6 PM.  . spironolactone (ALDACTONE) 25 MG tablet Take 1 tablet (25 mg total) by mouth daily.  . traZODone (DESYREL) 50 MG tablet Take 0.5-1 tablets (25-50 mg total) by mouth at bedtime as needed for sleep.  Marland Kitchen XIFAXAN 550 MG TABS  tablet TAKE 1 TABLET BY MOUTH 2 TIMES DAILY.   No facility-administered encounter medications on file as of 12/04/2019.      Lyal Husted Jenetta Downer, NP

## 2019-12-05 ENCOUNTER — Telehealth: Payer: Self-pay

## 2019-12-05 NOTE — Telephone Encounter (Signed)
Pt left v/m that she had annual exam on 10/13/19. Pt wants to know if Dr Glori Bickers wants pt to have 3rd covid vaccine and if so where and when should she have it.

## 2019-12-05 NOTE — Telephone Encounter (Signed)
As per guidelines now- about 8 months from the last injection (that would be November)  If recommendations change in the meantime we should know

## 2019-12-06 NOTE — Telephone Encounter (Addendum)
Pt notified of Dr. Marliss Coots comments and verbalized understanding. She will plan on getting 3rd vaccine in Nov.  Pt did have one more question for Dr. Glori Bickers. Pt said when she was in the hospital a few months ago and also when she had her Palliative care the providers mentioned pt should be taking a maintenance inhaler to help with her breathing on top of her "rescue inhaler" pt not sure what inhaler they were talking about but wanted to check to see with Dr. Glori Bickers thought

## 2019-12-06 NOTE — Telephone Encounter (Signed)
I pended symbicort to try 2 puffs bid  Please send to pharmacy of choice This has a long acting bronchodilator and steroid  She can still use albuterol prn   If insurance does not cover then please have her get a list of covered agents Thanks

## 2019-12-06 NOTE — Telephone Encounter (Signed)
That usually means a corticosteroid inhaler but I am not entirely sure - who was supposed to px it?

## 2019-12-06 NOTE — Telephone Encounter (Addendum)
No one was suppose to prescribe it she said that the ER doc asked her if she was on one, and the palliative appt asked her the same thing they just said given her breathing she may want to see if you think she should be on a maintenance inhaler.

## 2019-12-07 ENCOUNTER — Ambulatory Visit: Payer: Medicare HMO

## 2019-12-07 ENCOUNTER — Other Ambulatory Visit: Payer: Self-pay

## 2019-12-07 ENCOUNTER — Inpatient Hospital Stay: Payer: Medicare HMO

## 2019-12-07 VITALS — BP 115/51 | HR 63 | Temp 97.8°F | Resp 18

## 2019-12-07 DIAGNOSIS — Z79899 Other long term (current) drug therapy: Secondary | ICD-10-CM | POA: Diagnosis not present

## 2019-12-07 DIAGNOSIS — E875 Hyperkalemia: Secondary | ICD-10-CM | POA: Diagnosis not present

## 2019-12-07 DIAGNOSIS — R0602 Shortness of breath: Secondary | ICD-10-CM | POA: Diagnosis not present

## 2019-12-07 DIAGNOSIS — D500 Iron deficiency anemia secondary to blood loss (chronic): Secondary | ICD-10-CM | POA: Diagnosis not present

## 2019-12-07 DIAGNOSIS — M5030 Other cervical disc degeneration, unspecified cervical region: Secondary | ICD-10-CM | POA: Diagnosis not present

## 2019-12-07 DIAGNOSIS — K5520 Angiodysplasia of colon without hemorrhage: Secondary | ICD-10-CM

## 2019-12-07 DIAGNOSIS — F1010 Alcohol abuse, uncomplicated: Secondary | ICD-10-CM | POA: Diagnosis not present

## 2019-12-07 DIAGNOSIS — Z853 Personal history of malignant neoplasm of breast: Secondary | ICD-10-CM | POA: Diagnosis not present

## 2019-12-07 DIAGNOSIS — E119 Type 2 diabetes mellitus without complications: Secondary | ICD-10-CM | POA: Diagnosis not present

## 2019-12-07 DIAGNOSIS — Z9221 Personal history of antineoplastic chemotherapy: Secondary | ICD-10-CM | POA: Diagnosis not present

## 2019-12-07 MED ORDER — SODIUM CHLORIDE 0.9 % IV SOLN
200.0000 mg | Freq: Once | INTRAVENOUS | Status: AC
  Administered 2019-12-07: 200 mg via INTRAVENOUS
  Filled 2019-12-07: qty 200

## 2019-12-07 MED ORDER — ACETAMINOPHEN 325 MG PO TABS: 650.0000 mg | ORAL_TABLET | Freq: Once | ORAL | Status: DC

## 2019-12-07 MED ORDER — SODIUM CHLORIDE 0.9 % IV SOLN
Freq: Once | INTRAVENOUS | Status: AC
  Filled 2019-12-07: qty 250

## 2019-12-07 MED ORDER — LORATADINE 10 MG PO TABS: 10.0000 mg | ORAL_TABLET | Freq: Once | ORAL | Status: DC

## 2019-12-07 MED ORDER — BUDESONIDE-FORMOTEROL FUMARATE 160-4.5 MCG/ACT IN AERO: 2.0000 | INHALATION_SPRAY | Freq: Two times a day (BID) | RESPIRATORY_TRACT | 11 refills | Status: DC

## 2019-12-07 NOTE — Telephone Encounter (Signed)
Patient advised. Rx sent to pharmacy.

## 2019-12-07 NOTE — Patient Instructions (Signed)

## 2019-12-07 NOTE — Progress Notes (Signed)
Patient declined premedications, stating she has not needed them for previous infusions and has not had a reaction. Patient declined to stay for 30 minute post observation. VSS upon leaving infusion room.

## 2019-12-11 ENCOUNTER — Encounter: Payer: Self-pay | Admitting: Cardiology

## 2019-12-11 ENCOUNTER — Ambulatory Visit (INDEPENDENT_AMBULATORY_CARE_PROVIDER_SITE_OTHER): Payer: Medicare HMO | Admitting: Cardiology

## 2019-12-11 ENCOUNTER — Other Ambulatory Visit: Payer: Self-pay

## 2019-12-11 VITALS — BP 124/64 | HR 73 | Ht 64.0 in | Wt 99.0 lb

## 2019-12-11 DIAGNOSIS — R64 Cachexia: Secondary | ICD-10-CM | POA: Diagnosis not present

## 2019-12-11 DIAGNOSIS — I2510 Atherosclerotic heart disease of native coronary artery without angina pectoris: Secondary | ICD-10-CM

## 2019-12-11 DIAGNOSIS — Z720 Tobacco use: Secondary | ICD-10-CM

## 2019-12-11 DIAGNOSIS — Z79899 Other long term (current) drug therapy: Secondary | ICD-10-CM | POA: Diagnosis not present

## 2019-12-11 DIAGNOSIS — R0602 Shortness of breath: Secondary | ICD-10-CM | POA: Diagnosis not present

## 2019-12-11 NOTE — Patient Instructions (Signed)
Medication Instructions:  Your physician recommends that you continue on your current medications as directed. Please refer to the Current Medication list given to you today.  *If you need a refill on your cardiac medications before your next appointment, please call your pharmacy*   Lab Work: CMET, CBC today  If you have labs (blood work) drawn today and your tests are completely normal, you will receive your results only by:  [place] (if you have MyChart) OR  A paper copy in the mail If you have any lab test that is abnormal or we need to change your treatment, we will call you to review the results.  Follow-Up: At [facility], you and your health needs are our priority.  As part of our continuing mission to provide you with exceptional heart care, we have created designated Provider Care Teams.  These Care Teams include your primary Cardiologist (physician) and Advanced Practice Providers (APPs -  Physician Assistants and Nurse Practitioners) who all work together to provide you with the care you need, when you need it.  We recommend signing up for the patient portal called "MyChart".  Sign up information is provided on this After Visit Summary.  MyChart is used to connect with patients for Virtual Visits (Telemedicine).  Patients are able to view lab/test results, encounter notes, upcoming appointments, etc.  Non-urgent messages can be sent to your provider as well.   To learn more about what you can do with MyChart, go to [email or web address].    Your next appointment:   6 month(s)  The format for your next appointment:   In Person  Provider:   Oswaldo Milian, MD   Other Instructions You have been referred to: Nutritionist You have been referred to: Careguide to assist with smoking cessation

## 2019-12-11 NOTE — Progress Notes (Signed)
Cardiology Office Note:    Date:  12/12/2019   ID:  Christine Barton, DOB 09/17/1950, MRN [id number]  PCP:  Christine Greenspan, MD  Cardiologist:  No primary care provider on file.  Electrophysiologist:  None   Referring MD: Christine Greenspan, MD   Chief Complaint  Patient presents with  . Coronary Artery Disease    History of Present Illness:    Christine Barton is a 69 y.o. female with a hx of cirrhosis c/b esophageal varices, tobacco use, hyperlipidemia, type 2 diabetes who presents for follow-up.  She was referred by Christine Barton for evaluation of coronary artery calcifications seen on chest CT, with initial appointment on 02/13/2019.  She underwent a chest CT for lung cancer screening on 01/18/2019, which showed multivessel coronary artery calcifications.  Patient denies any chest pain.  States that her activity has been limited by back pain but has been doing physical therapy.  She does report intermittent dyspnea with exertion, which she states improves with inhaler use.  She continues to smoke 1 pack/day.  Had been taken off diuretics but had to restart.  TTE on 02/21/2019 showed normal LV systolic function, normal RV function, no significant valvular disease.  Since her last clinic visit, she had recent admission with GI bleed.  Reports she still is having dark-colored stools but is taking iron supplement.  She denies any chest pain.  Reports she continues to have some shortness of breath with exertion, but improves with inhaler use.  Continues to smoke 1 pack/day.  She walks once per day for about 10 minutes.    Past Medical History:  Diagnosis Date  . Alcohol abuse, unspecified   . Breast cancer ([place]) 1998   Right  . Cataract    left eye  . Cervical spondylosis 2006   MRI  . Degeneration of cervical intervertebral disc 2006   MRI  . Diabetes mellitus without complication ([place])   . Diverticulosis of colon (without mention of hemorrhage)   . Hyperpotassemia   . Microscopic hematuria    . Mononeuritis of unspecified site   . Nonspecific abnormal results of liver function study   . Other abnormal glucose   . Other and unspecified hyperlipidemia    no per pt  . Other chronic nonalcoholic liver disease   . Personal history of chemotherapy   . Personal history of malignant neoplasm of breast   . Personal history of radiation therapy   . Pneumothorax, acute    right, spontaneous  . Tobacco use disorder   . Unspecified vitamin D deficiency     Past Surgical History:  Procedure Laterality Date  . BIOPSY  07/27/2019   Procedure: BIOPSY;  Surgeon: Yetta Flock, MD;  Location: [facility];  Service: Gastroenterology;;  . BREAST BIOPSY  9/03   Right  . BREAST LUMPECTOMY Right 1998  . BREAST LUMPECTOMY  1998  . CHEST TUBE INSERTION  11/02/2014  . COLONOSCOPY    . COLONOSCOPY WITH PROPOFOL N/A 07/27/2019   Procedure: COLONOSCOPY WITH PROPOFOL;  Surgeon: Yetta Flock, MD;  Location: [facility];  Service: Gastroenterology;  Laterality: N/A;  . ESOPHAGOGASTRODUODENOSCOPY (EGD) WITH PROPOFOL N/A 10/30/2018   Procedure: ESOPHAGOGASTRODUODENOSCOPY (EGD) WITH PROPOFOL;  Surgeon: Mauri Pole, MD;  Location: [facility];  Service: Endoscopy;  Laterality: N/A;  . ESOPHAGOGASTRODUODENOSCOPY (EGD) WITH PROPOFOL N/A 03/12/2019   Procedure: ESOPHAGOGASTRODUODENOSCOPY (EGD) WITH PROPOFOL;  Surgeon: Mauri Pole, MD;  Location: [facility];  Service: Endoscopy;  Laterality: N/A;  .  ESOPHAGOGASTRODUODENOSCOPY (EGD) WITH PROPOFOL N/A 07/27/2019   Procedure: ESOPHAGOGASTRODUODENOSCOPY (EGD) WITH PROPOFOL;  Surgeon: Yetta Flock, MD;  Location: [facility];  Service: Gastroenterology;  Laterality: N/A;  . EYE SURGERY  02/2017   cataract extraction with lens implant-left  . HOT HEMOSTASIS N/A 07/27/2019   Procedure: HOT HEMOSTASIS (ARGON PLASMA COAGULATION/BICAP);  Surgeon: Yetta Flock, MD;  Location: [facility];  Service: Gastroenterology;   Laterality: N/A;  . MOUTH SURGERY    . POLYPECTOMY  07/27/2019   Procedure: POLYPECTOMY;  Surgeon: Yetta Flock, MD;  Location: [facility];  Service: Gastroenterology;;  . TUBAL LIGATION      Current Medications: Current Meds  Medication Sig  . albuterol (VENTOLIN HFA) 108 (90 Base) MCG/ACT inhaler INHALE 2 PUFFS INTO THE LUNGS EVERY 4 (FOUR) HOURS AS NEEDED FOR WHEEZING OR SHORTNESS OF BREATH.  . budesonide-formoterol (SYMBICORT) 160-4.5 MCG/ACT inhaler Inhale 2 puffs into the lungs 2 (two) times daily.  . furosemide (LASIX) 20 MG tablet Take 1 tablet (20 mg total) by mouth daily.  Marland Kitchen lactulose (CHRONULAC) 10 GM/15ML solution Take 15 mLs (10 g total) by mouth 3 (three) times daily.  . metFORMIN (GLUCOPHAGE XR) 500 MG 24 hr tablet Take 1 tablet (500 mg total) by mouth daily with breakfast.  . Multiple Vitamin (MULTIVITAMIN) tablet Take 1 tablet by mouth daily.  . mupirocin ointment (BACTROBAN) 2 % APPLY 1 APPLICATION TOPICALLY 2 (TWO) TIMES DAILY. TO AFFECTED AREAS  . nadolol (CORGARD) 20 MG tablet TAKE 1 TABLET BY MOUTH EVERY DAY  . nystatin (MYCOSTATIN) 100000 UNIT/ML suspension TAKE 5 MLS (500,000 UNITS TOTAL) BY MOUTH 3 (THREE) TIMES DAILY. SWISH AND SWALLOW  . pantoprazole (PROTONIX) 40 MG tablet TAKE 1 TABLET (40 MG TOTAL) BY MOUTH 2 (TWO) TIMES DAILY BEFORE A MEAL.  Christine Faster Glycol-Propyl Glycol (SYSTANE OP) Place 1 drop into the left eye daily as needed (dryness).  . rosuvastatin (CRESTOR) 5 MG tablet Take 1 tablet (5 mg total) by mouth daily at 6 PM.  . spironolactone (ALDACTONE) 25 MG tablet Take 1 tablet (25 mg total) by mouth daily.  . traZODone (DESYREL) 50 MG tablet Take 0.5-1 tablets (25-50 mg total) by mouth at bedtime as needed for sleep.  Marland Kitchen XIFAXAN 550 MG TABS tablet TAKE 1 TABLET BY MOUTH 2 TIMES DAILY.     Allergies:   Oxycodone and Glipizide   Social History   Socioeconomic History  . Marital status: Single    Spouse name: Not on file  . Number of  children: 1  . Years of education: Not on file  . Highest education level: Not on file  Occupational History  . Occupation: retired    Fish farm manager: [employer]  Tobacco Use  . Smoking status: Current Every Day Smoker    Packs/day: 0.50    Years: 53.00    Pack years: 26.50    Types: Cigarettes  . Smokeless tobacco: Never Used  . Tobacco comment: tobacco info given 04/27/2018  Vaping Use  . Vaping Use: Never used  Substance and Sexual Activity  . Alcohol use: Not Currently  . Drug use: No  . Sexual activity: Not Currently  Other Topics Concern  . Not on file  Social History Narrative   Divorced      1 child      Works at [employer] Strain: [place]   . Difficulty of Paying Living Expenses: Not hard  at all  Food Insecurity: No Food Insecurity  . Worried About Charity fundraiser in the Last Year: Never true  . Ran Out of Food in the Last Year: Never true  Transportation Needs: No Transportation Needs  . Lack of Transportation (Medical): No  . Lack of Transportation (Non-Medical): No  Physical Activity: Insufficiently Active  . Days of Exercise per Week: 7 days  . Minutes of Exercise per Session: 10 min  Stress: Stress Concern Present  . Feeling of Stress : To some extent  Social Connections:   . Frequency of Communication with Friends and Family: Not on file  . Frequency of Social Gatherings with Friends and Family: Not on file  . Attends Religious Services: Not on file  . Active Member of Clubs or Organizations: Not on file  . Attends Archivist Meetings: Not on file  . Marital Status: Not on file     Family History: The patient's \ family history includes Colon cancer in her maternal uncle; Heart attack in her father; Heart failure in her father; Stroke in her mother. There is no history of Esophageal cancer, Rectal cancer, Stomach cancer, or Pancreatic cancer.  ROS:   Please see the  history of present illness.    \All other systems reviewed and are negative.  EKGs/Labs/Other Studies Reviewed:    The following studies were reviewed today:   EKG:  EKG is ordered today.  The ekg ordered demonstrates normal sinus rhythm, rate 73, PAC, no ST/T abnormalities  Recent Labs: 07/27/2019: Magnesium 1.5 11/02/2019: TSH 3.95 12/11/2019: ALT 14; BUN 35; Creatinine, Ser 0.84; Hemoglobin 12.4; Platelets 120; Potassium 4.7; Sodium 140  Recent Lipid Panel    Component Value Date/Time   CHOL 90 11/02/2019 0831   TRIG 92.0 11/02/2019 0831   HDL 37.70 (L) 11/02/2019 0831   CHOLHDL 2 11/02/2019 0831   VLDL 18.4 11/02/2019 0831   LDLCALC 34 11/02/2019 0831   LDLDIRECT 83.0 10/31/2017 0838    Physical Exam:    VS:  BP 124/64   Pulse 73   Ht 5' 4"  (1.626 m)   Wt 99 lb (44.9 kg)   LMP  (LMP Unknown)   SpO2 98%   BMI 16.99 kg/m     Wt Readings from Last 3 Encounters:  12/11/19 99 lb (44.9 kg)  11/13/19 101 lb 14.4 oz (46.2 kg)  11/09/19 100 lb 4 oz (45.5 kg)     GEN: Cachectic, in no acute distress HEENT: Normal NECK: No JVD LYMPHATICS: No lymphadenopathy CARDIAC: RRR, no murmurs RESPIRATORY:  Clear to auscultation without rales, wheezing or rhonchi  ABDOMEN: Soft, non-tender, non-distended MUSCULOSKELETAL:  No edema SKIN: Warm and dry NEUROLOGIC:  Alert and oriented x 3 PSYCHIATRIC:  Normal affect   ASSESSMENT:    1. Coronary artery disease involving native coronary artery of native heart without angina pectoris   2. Cachexia ([place])   3. Medication management   4. SOB (shortness of breath)   5. Tobacco use    PLAN:    In order of problems listed above:  Coronary artery disease: CT chest showed multivessel coronary calcifications.  She denies any chest pain.  She does report dyspnea on exertion, which could represent anginal equivalent -Last LDL 70 on 11/06/2018.  She appears compensated in regards to her cirrhosis.  Discussed with her hepatologist, Dr.  Silverio Barton, and OK to start on statin.  Started on rosuvastatin 5 mg daily in 02/2019.  LFTs have been stable -Would not start on daily  aspirin given issues with GI bleeding -Previously recommended Lexiscan Myoview to evaluate for ischemia, but patient wishes to hold off given expense.  Suspect her dyspnea is due to COPD, has improved with inhaler use.  Will hold off on further work-up at this time.  Dyspnea: Suspect related to COPD, as improves with inhaler use. TTE on 02/21/2019 showed no structural heart disease.  Type 2 diabetes: Well-controlled, last A1c 6.2 11/02/19  Tobacco use: Smokes 1ppd.  Patient counseled on risks of tobacco use and cessation strongly encouraged.  Refer to care guide to assist with smoking cessation.  Recent GI bleed: Continues to have black-colored stools but may be due to iron supplementation.  Will check CBC  Cachexia: Refer to nutrition  RTC in 6 months  Medication Adjustments/Labs and Tests Ordered: Current medicines are reviewed at length with the patient today.  Concerns regarding medicines are outlined above.  Orders Placed This Encounter  Procedures  . CBC  . Comprehensive metabolic panel  . Ambulatory referral to Nutrition and Diabetic Education  . EKG 12-Lead   No orders of the defined types were placed in this encounter.   Patient Instructions  Medication Instructions:  Your physician recommends that you continue on your current medications as directed. Please refer to the Current Medication list given to you today.  *If you need a refill on your cardiac medications before your next appointment, please call your pharmacy*   Lab Work: CMET, CBC today  If you have labs (blood work) drawn today and your tests are completely normal, you will receive your results only by: Marland Kitchen MyChart Message (if you have MyChart) OR . A paper copy in the mail If you have any lab test that is abnormal or we need to change your treatment, we will call you to  review the results.  Follow-Up: At [facility], you and your health needs are our priority.  As part of our continuing mission to provide you with exceptional heart care, we have created designated Provider Care Teams.  These Care Teams include your primary Cardiologist (physician) and Advanced Practice Providers (APPs -  Physician Assistants and Nurse Practitioners) who all work together to provide you with the care you need, when you need it.  We recommend signing up for the patient portal called "MyChart".  Sign up information is provided on this After Visit Summary.  MyChart is used to connect with patients for Virtual Visits (Telemedicine).  Patients are able to view lab/test results, encounter notes, upcoming appointments, etc.  Non-urgent messages can be sent to your provider as well.   To learn more about what you can do with MyChart, go to [email or web address].    Your next appointment:   6 month(s)  The format for your next appointment:   In Person  Provider:   Oswaldo Milian, MD   Other Instructions You have been referred to: Nutritionist You have been referred to: Careguide to assist with smoking cessation       Signed, Donato Heinz, MD  12/12/2019 11:55 AM    [place]

## 2019-12-12 LAB — COMPREHENSIVE METABOLIC PANEL
ALT: 14 IU/L (ref 0–32)
AST: 27 IU/L (ref 0–40)
Albumin/Globulin Ratio: 1.9 (ref 1.2–2.2)
Albumin: 5 g/dL — ABNORMAL HIGH (ref 3.8–4.8)
Alkaline Phosphatase: 131 IU/L — ABNORMAL HIGH (ref 48–121)
BUN/Creatinine Ratio: 42 — ABNORMAL HIGH (ref 12–28)
BUN: 35 mg/dL — ABNORMAL HIGH (ref 8–27)
Bilirubin Total: 0.7 mg/dL (ref 0.0–1.2)
CO2: 25 mmol/L (ref 20–29)
Calcium: 10.3 mg/dL (ref 8.7–10.3)
Chloride: 100 mmol/L (ref 96–106)
Creatinine, Ser: 0.84 mg/dL (ref 0.57–1.00)
GFR calc Af Amer: 83 mL/min/{1.73_m2} (ref >59)
GFR calc non Af Amer: 72 mL/min/{1.73_m2} (ref >59)
Globulin, Total: 2.7 g/dL (ref 1.5–4.5)
Glucose: 130 mg/dL — ABNORMAL HIGH (ref 65–99)
Potassium: 4.7 mmol/L (ref 3.5–5.2)
Sodium: 140 mmol/L (ref 134–144)
Total Protein: 7.7 g/dL (ref 6.0–8.5)

## 2019-12-12 LAB — CBC
Hematocrit: 39.3 % (ref 34.0–46.6)
Hemoglobin: 12.4 g/dL (ref 11.1–15.9)
MCH: 27.2 pg (ref 26.6–33.0)
MCHC: 31.6 g/dL (ref 31.5–35.7)
MCV: 86 fL (ref 79–97)
Platelets: 120 10*3/uL — ABNORMAL LOW (ref 150–450)
RBC: 4.56 x10E6/uL (ref 3.77–5.28)
RDW: 17 % — ABNORMAL HIGH (ref 11.7–15.4)
WBC: 7.4 10*3/uL (ref 3.4–10.8)

## 2019-12-14 ENCOUNTER — Inpatient Hospital Stay: Payer: Medicare HMO | Attending: Physician Assistant

## 2019-12-14 ENCOUNTER — Ambulatory Visit: Payer: Medicare HMO

## 2019-12-14 ENCOUNTER — Other Ambulatory Visit: Payer: Self-pay

## 2019-12-14 VITALS — BP 118/70 | HR 65 | Temp 97.9°F | Resp 17

## 2019-12-14 DIAGNOSIS — D500 Iron deficiency anemia secondary to blood loss (chronic): Secondary | ICD-10-CM | POA: Diagnosis not present

## 2019-12-14 DIAGNOSIS — K5520 Angiodysplasia of colon without hemorrhage: Secondary | ICD-10-CM

## 2019-12-14 DIAGNOSIS — Q2733 Arteriovenous malformation of digestive system vessel: Secondary | ICD-10-CM | POA: Diagnosis not present

## 2019-12-14 MED ORDER — LORATADINE 10 MG PO TABS: 10.0000 mg | ORAL_TABLET | Freq: Once | ORAL | Status: DC

## 2019-12-14 MED ORDER — SODIUM CHLORIDE 0.9 % IV SOLN
Freq: Once | INTRAVENOUS | Status: AC
  Filled 2019-12-14: qty 250

## 2019-12-14 MED ORDER — ACETAMINOPHEN 325 MG PO TABS: 650.0000 mg | ORAL_TABLET | Freq: Once | ORAL | Status: DC

## 2019-12-14 MED ORDER — SODIUM CHLORIDE 0.9 % IV SOLN
200.0000 mg | Freq: Once | INTRAVENOUS | Status: AC
  Administered 2019-12-14: 200 mg via INTRAVENOUS
  Filled 2019-12-14: qty 200

## 2019-12-14 NOTE — Patient Instructions (Signed)

## 2019-12-14 NOTE — Progress Notes (Signed)
Pt here today for Venofer infusion Patient refused premeds and refused post treatment observation. Vital signs stable and patient has received several treatments prior with no issues

## 2019-12-18 ENCOUNTER — Telehealth: Payer: Self-pay

## 2019-12-18 NOTE — Telephone Encounter (Signed)
Patient shared that she was seeking more end of life planning and counseling (living wills, financial concerns etc.) and wanted to be sure the services/resources provided were [facility] base. SW provided patient with [employer] contact [phone]) that offers free legal assistance with wills, POA's, financial planning, etc. SW also provided to the [place] [phone]) that offers free confidential counseling support. Patient shared that she would like to have a Hospice chaplain to speak to about her end of life concerns. Patient did not have any further needs of SW at this time. SW provided patient with SW contact info.

## 2019-12-24 DIAGNOSIS — H2511 Age-related nuclear cataract, right eye: Secondary | ICD-10-CM | POA: Diagnosis not present

## 2019-12-24 DIAGNOSIS — H25011 Cortical age-related cataract, right eye: Secondary | ICD-10-CM | POA: Diagnosis not present

## 2019-12-24 LAB — HM DIABETES EYE EXAM

## 2019-12-31 ENCOUNTER — Ambulatory Visit
Admission: RE | Admit: 2019-12-31 | Discharge: 2019-12-31 | Disposition: A | Discharge status: Home / Self Care | Payer: Medicare HMO | Source: Ambulatory Visit | Attending: Gastroenterology | Admitting: Gastroenterology

## 2019-12-31 DIAGNOSIS — K7460 Unspecified cirrhosis of liver: Secondary | ICD-10-CM | POA: Diagnosis not present

## 2019-12-31 DIAGNOSIS — K838 Other specified diseases of biliary tract: Secondary | ICD-10-CM | POA: Diagnosis not present

## 2019-12-31 DIAGNOSIS — K8020 Calculus of gallbladder without cholecystitis without obstruction: Secondary | ICD-10-CM | POA: Diagnosis not present

## 2019-12-31 DIAGNOSIS — K7031 Alcoholic cirrhosis of liver with ascites: Secondary | ICD-10-CM

## 2019-12-31 MED ORDER — GADOBENATE DIMEGLUMINE 529 MG/ML IV SOLN
9.0000 mL | Freq: Once | INTRAVENOUS | Status: AC | PRN
  Administered 2019-12-31: 9 mL via INTRAVENOUS

## 2020-01-01 ENCOUNTER — Telehealth: Payer: Self-pay | Admitting: Gastroenterology

## 2020-01-01 NOTE — Telephone Encounter (Signed)
She had an MRI which is her "yearly thing." She wanted to know if you could break it down into a language she understands. Overall she is feeling "reaaly good." She is eating food and "I actually get hungry!"

## 2020-01-02 ENCOUNTER — Other Ambulatory Visit: Payer: Self-pay | Admitting: Family Medicine

## 2020-01-02 DIAGNOSIS — Z1231 Encounter for screening mammogram for malignant neoplasm of breast: Secondary | ICD-10-CM

## 2020-01-03 DIAGNOSIS — Z650 Conviction in civil and criminal proceedings without imprisonment: Secondary | ICD-10-CM | POA: Diagnosis not present

## 2020-01-03 DIAGNOSIS — F332 Major depressive disorder, recurrent severe without psychotic features: Secondary | ICD-10-CM | POA: Diagnosis not present

## 2020-01-03 DIAGNOSIS — F411 Generalized anxiety disorder: Secondary | ICD-10-CM | POA: Diagnosis not present

## 2020-01-04 NOTE — Telephone Encounter (Signed)
Stable findings with cirrhosis, no mass lesions suggestive of cancer. Iron deposits in liver likely secondary IV iron. Please schedule follow up office visit to discuss further if has questions. Thanks

## 2020-01-07 ENCOUNTER — Telehealth: Payer: Self-pay | Admitting: *Deleted

## 2020-01-07 ENCOUNTER — Encounter: Payer: Self-pay | Admitting: Family Medicine

## 2020-01-07 ENCOUNTER — Other Ambulatory Visit: Payer: Medicare HMO | Admitting: Adult Health Nurse Practitioner

## 2020-01-07 ENCOUNTER — Other Ambulatory Visit: Payer: Self-pay

## 2020-01-07 DIAGNOSIS — Z515 Encounter for palliative care: Secondary | ICD-10-CM | POA: Diagnosis not present

## 2020-01-07 DIAGNOSIS — K7030 Alcoholic cirrhosis of liver without ascites: Secondary | ICD-10-CM

## 2020-01-07 DIAGNOSIS — Z122 Encounter for screening for malignant neoplasm of respiratory organs: Secondary | ICD-10-CM

## 2020-01-07 DIAGNOSIS — Z87891 Personal history of nicotine dependence: Secondary | ICD-10-CM

## 2020-01-07 NOTE — Telephone Encounter (Signed)
Contacted and scheduled. Current smoker, 54 pack year

## 2020-01-07 NOTE — Telephone Encounter (Signed)
Shared this information with the patient. Her follow up appointment already scheduled for 01/14/20.

## 2020-01-07 NOTE — Progress Notes (Signed)
[facility] Consult Note Telephone: [phone]  Fax: [phone]  PATIENT NAME: Christine Barton DOB: Jul 01, 1950 MRN: [id number]  PRIMARY CARE PROVIDER:   [name], MD  REFERRING PROVIDER:  [name], MD [place],  [place] [address]  RESPONSIBLE PARTY:   Self [phone]   RECOMMENDATIONS and PLAN:  1.  Advanced care planning.  Patient states that her and her daughter went over the MOST form together.  Stated that this was a tough conversation.  They did start the conversation about her medical wishes.  Patient did state that she would like to have CPR and aggressive measures attempted but would not like these measures for prolonged periods of time it did look like she was going to have brain damage or was not going to recover.  We further discussed options on the most and advanced directive forms.  Encouraged her and her daughter to discuss this further and that we can set up an appointment to 3 of [place] to go over any questions they may have.  2.  Functional status.  Patient has no changes in her functional status.  She is high functioning and lives by herself takes care of her ADLs and IADLs independently.  She walks with no assistive devices is continent of bowel and bladder  3.  Support.  Patient had a visit by will 1 of [facility]'s hospice chaplain's.  She states that this was "a God send" and that she felt much better after talking with him.  States that they have another appointment in a month.  States that he has referred her to a counseling center in [place] and that she had her 1st meeting with one of the counselors last week.  States that she would like to continue going there and that it was very beneficial.  Patient stated that she is feeling really good right now physically and would like to help elderly.  Stated that if we know of anybody that needed help whether just have someone sit with them or help  cleaning the house that she would like [place] to refer them to her.  Stated that we could not refer individuals to others but that we could reach out to our volunteer services to see if they had any avenues in which she could help other people.  Patient is doing well and is stable right now.  Do need to continue meeting to further discuss and finalize advanced care planning.  Palliative will continue to monitor for symptom management/decline and make recommendations as needed.  Have appointment in 4 weeks  I spent 80 minutes providing this consultation,  from 2:00 to 3:20 including time spent with patient, chart review, provider coordination, documentation. More than 50% of the time in this consultation was spent coordinating communication.   HISTORY OF PRESENT ILLNESS:  Christine Barton is a 69 y.o. year old female with multiple medical problems including cirrhosis of the liver, anemia, COPD, AVM of colon, esophageal varices, diverticulosis of colon, DM T2, h/o right breast cancer. Palliative Care was asked to help address goals of care.  Patient states that the MRI of her abdomen on 12/31/2019 looked good with no significant disease progression at this time. Patient does state that she was told in February 2020 that she had approximately a 5-year prognosis. States that she has not been having any more abdominal discomfort.  Denies pain, dizziness, shortness of breath, cough, N/V/D, constipation, dysuria, hematuria.  She does state that her appetite is improving.  CODE STATUS: see above  PPS: 70% HOSPICE ELIGIBILITY/DIAGNOSIS: TBD  PHYSICAL EXAM:   General: NAD, frail appearing, thin Extremities: no edema, no joint deformities Skin: no rashes on exposed skin Neurological: nonfocal.  A&O x3  PAST MEDICAL HISTORY:  Past Medical History:  Diagnosis Date  . Alcohol abuse, unspecified   . Breast cancer ([place]) 1998   Right  . Cataract    left eye  . Cervical spondylosis 2006   MRI  . Degeneration  of cervical intervertebral disc 2006   MRI  . Diabetes mellitus without complication ([place])   . Diverticulosis of colon (without mention of hemorrhage)   . Hyperpotassemia   . Microscopic hematuria   . Mononeuritis of unspecified site   . Nonspecific abnormal results of liver function study   . Other abnormal glucose   . Other and unspecified hyperlipidemia    no per pt  . Other chronic nonalcoholic liver disease   . Personal history of chemotherapy   . Personal history of malignant neoplasm of breast   . Personal history of radiation therapy   . Pneumothorax, acute    right, spontaneous  . Tobacco use disorder   . Unspecified vitamin D deficiency     SOCIAL HX:  Social History   Tobacco Use  . Smoking status: Current Every Day Smoker    Packs/day: 0.50    Years: 53.00    Pack years: 26.50    Types: Cigarettes  . Smokeless tobacco: Never Used  . Tobacco comment: tobacco info given 04/27/2018  Substance Use Topics  . Alcohol use: Not Currently    ALLERGIES:  Allergies  Allergen Reactions  . Oxycodone Nausea And Vomiting  . Glipizide Other (See Comments)    Stomach pain     PERTINENT MEDICATIONS:  Outpatient Encounter Medications as of 01/07/2020  Medication Sig  . albuterol (VENTOLIN HFA) 108 (90 Base) MCG/ACT inhaler INHALE 2 PUFFS INTO THE LUNGS EVERY 4 (FOUR) HOURS AS NEEDED FOR WHEEZING OR SHORTNESS OF BREATH.  . budesonide-formoterol (SYMBICORT) 160-4.5 MCG/ACT inhaler Inhale 2 puffs into the lungs 2 (two) times daily.  . furosemide (LASIX) 20 MG tablet Take 1 tablet (20 mg total) by mouth daily.  Marland Kitchen lactulose (CHRONULAC) 10 GM/15ML solution Take 15 mLs (10 g total) by mouth 3 (three) times daily.  . metFORMIN (GLUCOPHAGE XR) 500 MG 24 hr tablet Take 1 tablet (500 mg total) by mouth daily with breakfast.  . Multiple Vitamin (MULTIVITAMIN) tablet Take 1 tablet by mouth daily.  . Multiple Vitamins-Minerals (PRESERVISION AREDS 2) CAPS Take 1 capsule by mouth 2 (two)  times daily.  . mupirocin ointment (BACTROBAN) 2 % APPLY 1 APPLICATION TOPICALLY 2 (TWO) TIMES DAILY. TO AFFECTED AREAS  . nadolol (CORGARD) 20 MG tablet TAKE 1 TABLET BY MOUTH EVERY DAY  . nystatin (MYCOSTATIN) 100000 UNIT/ML suspension TAKE 5 MLS (500,000 UNITS TOTAL) BY MOUTH 3 (THREE) TIMES DAILY. SWISH AND SWALLOW  . pantoprazole (PROTONIX) 40 MG tablet TAKE 1 TABLET (40 MG TOTAL) BY MOUTH 2 (TWO) TIMES DAILY BEFORE A MEAL.  [name] Glycol-Propyl Glycol (SYSTANE OP) Place 1 drop into the left eye daily as needed (dryness).  . rosuvastatin (CRESTOR) 5 MG tablet Take 1 tablet (5 mg total) by mouth daily at 6 PM.  . spironolactone (ALDACTONE) 25 MG tablet Take 1 tablet (25 mg total) by mouth daily.  [name] 0.6 % SOLN Apply 1 drop to eye 2 (two) times daily.  Marland Kitchen  traZODone (DESYREL) 50 MG tablet Take 0.5-1 tablets (25-50 mg total) by mouth at bedtime as needed for sleep.  Marland Kitchen XIFAXAN 550 MG TABS tablet TAKE 1 TABLET BY MOUTH 2 TIMES DAILY.   No facility-administered encounter medications on file as of 01/07/2020.    Violanda Bobeck Jenetta Downer, NP

## 2020-01-09 ENCOUNTER — Telehealth: Payer: Self-pay

## 2020-01-09 ENCOUNTER — Other Ambulatory Visit: Payer: Self-pay | Admitting: Gastroenterology

## 2020-01-09 ENCOUNTER — Other Ambulatory Visit: Payer: Self-pay | Admitting: Family Medicine

## 2020-01-09 ENCOUNTER — Other Ambulatory Visit: Payer: Self-pay | Admitting: Cardiology

## 2020-01-09 NOTE — Telephone Encounter (Signed)
This is Dr. Newman Nickels pt

## 2020-01-09 NOTE — Telephone Encounter (Signed)
F/u scheduled on 05/12/20, nystatin last filled on 09/20/19 #120 mL with 0 refills

## 2020-01-09 NOTE — Telephone Encounter (Signed)
Pt request cb from [facility] about medication; pt said no emergency. I offered to help pt but she was appreciative but said would rather speak with [facility].CMA.

## 2020-01-10 MED ORDER — TEMAZEPAM 7.5 MG PO CAPS: 7.5000 mg | ORAL_CAPSULE | Freq: Every evening | ORAL | 0 refills | Status: DC | PRN

## 2020-01-10 NOTE — Telephone Encounter (Signed)
Called pt back and she advise me that the trazodone isn't helping at all, she having trouble mainly falling a sleep. Pt is asking for another medication to help, pt said she [facility] if PCP prescribes her something else. [name] daughter said she uses temazepam and it helps her fall a sleep and pt was asking if this is a med she can try. Pt said given her liver and history if this isn't a med PCP recommends then she just needs another Rx sent in for something PCP does recommend.   [facility] [employer]

## 2020-01-10 NOTE — Telephone Encounter (Signed)
Pt request cb.

## 2020-01-10 NOTE — Telephone Encounter (Signed)
Pt notified of Dr. Marliss Coots comments and verbalized understanding. She will only take med PRN and will keep working with psychologist

## 2020-01-10 NOTE — Telephone Encounter (Signed)
Pt notified of Dr. Marliss Coots comments. Pt said she is afraid to take 100 mgs because of how a whole pill made her feel. Pt didn't tell me on our 1st conversation that she did start off by taking 1/2 tab of the trazodone and it didn't help her sleep at all so she increased it to 1 whole pill. Pt said when she increased it to 1 whole pill it made her feel awful. Pt said she is on a fluid pill and has to get up in the middle of the night to use the bathroom and pt said she felt in a daze and out of it and had that "druggie" feeling every time she has to get up. Pt said could have tried to deal with that but she wakes up in the morning and instead of feeling "refreshed" from getting sleep she wakes up with that same "dazed, groggy druggie" feeling and it lingers for a long time and makes it hard to do her ADLs. Pt said also around the same time she started taking me she has started having nightmares. Pt thought it could be related to her taking the trazodone but the nights she hasn't taken any she still is having nightmares. Pt just wanted to to know incase it's related but she has started seeing a phycologist so I advise her to make sure they are aware also. Pt said she has tried multiple OTC meds: [facility], Melatonin. Also she tried Tylenol PM but had a bad reaction to it, it made her wake up suddenly gasping for air so she will not try that again. Pt said she's tried other OTC meds but just not sure of the names. Pt said she will not take 2 pills of trazodone tonight she will wait for Dr. Glori Bickers to review her message 1st

## 2020-01-10 NOTE — Telephone Encounter (Signed)
Before she gets rid of the trazodone -(if we have not tried it already)- we could try increasing dose to 100 mg  Has she tried anything otc ?    Treatment is tricky given her liver issues  I would like to avoid medicines in the class of temazepam given habit forming potential (especially in light of her past alcohol issues)  Let me know what she thinks about trying the increased trazodone and then will go from there

## 2020-01-10 NOTE — Telephone Encounter (Signed)
There are not a lot of good options and at her age -no sleep aid is 100% safe (in terms of falls)   I considered hydroxyzine but given rxn to tylenol pm and failure of otc meds that are similar = will not do that  Do not inc the trazodone   Temazepam would be the next logical choice so I sent it in  If possible -use prn instead of every night (in light of habit forming potential)   Most importantly -work with your therapist as anxiety may play a role   Let me know if this does not help

## 2020-01-10 NOTE — Telephone Encounter (Signed)
Left VM requesting pt to call the office back 

## 2020-01-14 ENCOUNTER — Ambulatory Visit (INDEPENDENT_AMBULATORY_CARE_PROVIDER_SITE_OTHER): Payer: Medicare HMO | Admitting: Gastroenterology

## 2020-01-14 ENCOUNTER — Other Ambulatory Visit (INDEPENDENT_AMBULATORY_CARE_PROVIDER_SITE_OTHER): Payer: Medicare HMO

## 2020-01-14 ENCOUNTER — Encounter: Payer: Self-pay | Admitting: Gastroenterology

## 2020-01-14 VITALS — BP 100/60 | HR 78 | Ht 64.0 in | Wt 99.4 lb

## 2020-01-14 DIAGNOSIS — K7031 Alcoholic cirrhosis of liver with ascites: Secondary | ICD-10-CM

## 2020-01-14 DIAGNOSIS — J449 Chronic obstructive pulmonary disease, unspecified: Secondary | ICD-10-CM | POA: Diagnosis not present

## 2020-01-14 DIAGNOSIS — D500 Iron deficiency anemia secondary to blood loss (chronic): Secondary | ICD-10-CM | POA: Diagnosis not present

## 2020-01-14 LAB — COMPREHENSIVE METABOLIC PANEL
ALT: 11 U/L (ref 0–35)
AST: 21 U/L (ref 0–37)
Albumin: 4.4 g/dL (ref 3.5–5.2)
Alkaline Phosphatase: 126 U/L — ABNORMAL HIGH (ref 39–117)
BUN: 44 mg/dL — ABNORMAL HIGH (ref 6–23)
CO2: 30 mEq/L (ref 19–32)
Calcium: 10.1 mg/dL (ref 8.4–10.5)
Chloride: 101 mEq/L (ref 96–112)
Creatinine, Ser: 0.85 mg/dL (ref 0.40–1.20)
GFR: 66.3 mL/min (ref >60.00)
Glucose, Bld: 86 mg/dL (ref 70–99)
Potassium: 3.9 mEq/L (ref 3.5–5.1)
Sodium: 139 mEq/L (ref 135–145)
Total Bilirubin: 0.6 mg/dL (ref 0.2–1.2)
Total Protein: 7.7 g/dL (ref 6.0–8.3)

## 2020-01-14 LAB — CBC WITH DIFFERENTIAL/PLATELET
Basophils Absolute: 0 10*3/uL (ref 0.0–0.1)
Basophils Relative: 0.6 % (ref 0.0–3.0)
Eosinophils Absolute: 0.1 10*3/uL (ref 0.0–0.7)
Eosinophils Relative: 1.2 % (ref 0.0–5.0)
HCT: 32.2 % — ABNORMAL LOW (ref 36.0–46.0)
Hemoglobin: 10.5 g/dL — ABNORMAL LOW (ref 12.0–15.0)
Lymphocytes Relative: 26.3 % (ref 12.0–46.0)
Lymphs Abs: 1.8 10*3/uL (ref 0.7–4.0)
MCHC: 32.7 g/dL (ref 30.0–36.0)
MCV: 87.2 fl (ref 78.0–100.0)
Monocytes Absolute: 0.6 10*3/uL (ref 0.1–1.0)
Monocytes Relative: 9.5 % (ref 3.0–12.0)
Neutro Abs: 4.2 10*3/uL (ref 1.4–7.7)
Neutrophils Relative %: 62.4 % (ref 43.0–77.0)
Platelets: 153 10*3/uL (ref 150.0–400.0)
RBC: 3.69 Mil/uL — ABNORMAL LOW (ref 3.87–5.11)
RDW: 20.8 % — ABNORMAL HIGH (ref 11.5–15.5)
WBC: 6.7 10*3/uL (ref 4.0–10.5)

## 2020-01-14 LAB — PROTIME-INR
INR: 1.2 ratio — ABNORMAL HIGH (ref 0.8–1.0)
Prothrombin Time: 13.7 s — ABNORMAL HIGH (ref 9.6–13.1)

## 2020-01-14 NOTE — Progress Notes (Addendum)
SRUTHI MAURER    [id number]    1950-06-10  Primary Care Physician:Tower, [name], MD  Referring Physician: Tower, [name], MD [place],  [place] [address]   Chief complaint: Cirrhosis HPI:  69 year old very pleasant female with history of breast cancer, type 2 diabetes, COPD, chronic anemia secondary to occult GI blood loss and decompensated cirrhosis  On IV iron infusion through hematology.  Denies any nausea, vomiting, abdominal pain, melena or bright red blood per rectum Overall she feels well, she is no longer tired. Denies any confusion, lower leg swelling or abdominal distention  Is taking Xifaxan and lactulose, has 2-3 soft bowel movements daily  MRI liver 01-08-2020: Stable cirrhosis with no evidence of hepatocellular carcinoma. Choledocholithiasis with stable CBD. Increased iron deposition in the setting of IV iron therapy  EGD July 27, 2019 by Dr. Havery Moros: Small esophageal varices and portal hypertensive gastropathy otherwise normal exam  Colonoscopy July 27, 2019: 8 small AVMs scattered in the colon ablated with APC and 3 small polyps removed, diverticulosis and internal hemorrhoids  EGD February 23, 2019:Small grade 1 esophageal varices, portal hypertensive gastropathy  Colonoscopy November 30, 2016: Diverticulosis, sessile polyps X4 tubular adenomas and internal hemorrhoids. Recall colonoscopy in 3 years    Outpatient Encounter Medications as of 01/14/2020  Medication Sig  . albuterol (VENTOLIN HFA) 108 (90 Base) MCG/ACT inhaler INHALE 2 PUFFS INTO THE LUNGS EVERY 4 (FOUR) HOURS AS NEEDED FOR WHEEZING OR SHORTNESS OF BREATH.  . budesonide-formoterol (SYMBICORT) 160-4.5 MCG/ACT inhaler Inhale 2 puffs into the lungs 2 (two) times daily.  . cholecalciferol (VITAMIN D3) 25 MCG (1000 UNIT) tablet Take 1,000 Units by mouth 2 (two) times daily.  . furosemide (LASIX) 20 MG tablet Take 1 tablet (20 mg total) by mouth daily.  Marland Kitchen  lactulose (CHRONULAC) 10 GM/15ML solution Take 15 mLs (10 g total) by mouth 3 (three) times daily.  . metFORMIN (GLUCOPHAGE-XR) 500 MG 24 hr tablet TAKE 1 TABLET BY MOUTH EVERY DAY WITH BREAKFAST  . Multiple Vitamin (MULTIVITAMIN) tablet Take 1 tablet by mouth daily.  . Multiple Vitamins-Minerals (PRESERVISION AREDS 2) CAPS Take 1 capsule by mouth 2 (two) times daily.  . mupirocin ointment (BACTROBAN) 2 % APPLY 1 APPLICATION TOPICALLY 2 (TWO) TIMES DAILY. TO AFFECTED AREAS  . nadolol (CORGARD) 20 MG tablet TAKE 1 TABLET BY MOUTH EVERY DAY  . nystatin (MYCOSTATIN) 100000 UNIT/ML suspension TAKE 5 MLS (500,000 UNITS TOTAL) BY MOUTH 3 (THREE) TIMES DAILY. SWISH AND SWALLOW  . pantoprazole (PROTONIX) 40 MG tablet TAKE 1 TABLET (40 MG TOTAL) BY MOUTH 2 (TWO) TIMES DAILY BEFORE A MEAL.  [name] Glycol-Propyl Glycol (SYSTANE OP) Place 1 drop into the left eye daily as needed (dryness).  . rosuvastatin (CRESTOR) 5 MG tablet TAKE 1 TABLET (5 MG TOTAL) BY MOUTH DAILY AT 6 PM.  . spironolactone (ALDACTONE) 25 MG tablet Take 1 tablet (25 mg total) by mouth daily.  [name] 0.6 % SOLN Apply 1 drop to eye 2 (two) times daily.  . temazepam (RESTORIL) 7.5 MG capsule Take 1 capsule (7.5 mg total) by mouth at bedtime as needed for sleep.  Marland Kitchen XIFAXAN 550 MG TABS tablet TAKE 1 TABLET BY MOUTH 2 TIMES DAILY.  . [DISCONTINUED] traZODone (DESYREL) 50 MG tablet Take 0.5-1 tablets (25-50 mg total) by mouth at bedtime as needed for sleep. (Patient not taking: Reported on 01/14/2020)   No facility-administered encounter medications on file  as of 01/14/2020.    Allergies as of 01/14/2020 - Review Complete 12/11/2019  Allergen Reaction Noted  . Oxycodone Nausea And Vomiting 11/02/2014  . Glipizide Other (See Comments) 09/12/2013    Past Medical History:  Diagnosis Date  . Alcohol abuse, unspecified   . Breast cancer ([place]) 1998   Right  . Cataract    left eye  . Cervical spondylosis 2006   MRI  .  Degeneration of cervical intervertebral disc 2006   MRI  . Diabetes mellitus without complication ([place])   . Diverticulosis of colon (without mention of hemorrhage)   . Hyperpotassemia   . Microscopic hematuria   . Mononeuritis of unspecified site   . Nonspecific abnormal results of liver function study   . Other abnormal glucose   . Other and unspecified hyperlipidemia    no per pt  . Other chronic nonalcoholic liver disease   . Personal history of chemotherapy   . Personal history of malignant neoplasm of breast   . Personal history of radiation therapy   . Pneumothorax, acute    right, spontaneous  . Tobacco use disorder   . Unspecified vitamin D deficiency     Past Surgical History:  Procedure Laterality Date  . BIOPSY  07/27/2019   Procedure: BIOPSY;  Surgeon: Yetta Flock, MD;  Location: [facility];  Service: Gastroenterology;;  . BREAST BIOPSY  9/03   Right  . BREAST LUMPECTOMY Right 1998  . BREAST LUMPECTOMY  1998  . CHEST TUBE INSERTION  11/02/2014  . COLONOSCOPY    . COLONOSCOPY WITH PROPOFOL N/A 07/27/2019   Procedure: COLONOSCOPY WITH PROPOFOL;  Surgeon: Yetta Flock, MD;  Location: [facility];  Service: Gastroenterology;  Laterality: N/A;  . ESOPHAGOGASTRODUODENOSCOPY (EGD) WITH PROPOFOL N/A 10/30/2018   Procedure: ESOPHAGOGASTRODUODENOSCOPY (EGD) WITH PROPOFOL;  Surgeon: Mauri Pole, MD;  Location: [facility];  Service: Endoscopy;  Laterality: N/A;  . ESOPHAGOGASTRODUODENOSCOPY (EGD) WITH PROPOFOL N/A 03/12/2019   Procedure: ESOPHAGOGASTRODUODENOSCOPY (EGD) WITH PROPOFOL;  Surgeon: Mauri Pole, MD;  Location: [facility];  Service: Endoscopy;  Laterality: N/A;  . ESOPHAGOGASTRODUODENOSCOPY (EGD) WITH PROPOFOL N/A 07/27/2019   Procedure: ESOPHAGOGASTRODUODENOSCOPY (EGD) WITH PROPOFOL;  Surgeon: Yetta Flock, MD;  Location: [facility];  Service: Gastroenterology;  Laterality: N/A;  . EYE SURGERY  02/2017   cataract  extraction with lens implant-left  . HOT HEMOSTASIS N/A 07/27/2019   Procedure: HOT HEMOSTASIS (ARGON PLASMA COAGULATION/BICAP);  Surgeon: Yetta Flock, MD;  Location: [facility];  Service: Gastroenterology;  Laterality: N/A;  . MOUTH SURGERY    . POLYPECTOMY  07/27/2019   Procedure: POLYPECTOMY;  Surgeon: Yetta Flock, MD;  Location: [facility];  Service: Gastroenterology;;  . TUBAL LIGATION      Family History  Problem Relation Age of Onset  . Heart failure Father   . Heart attack Father   . Colon cancer Maternal Uncle   . Stroke Mother   . Esophageal cancer Neg Hx   . Rectal cancer Neg Hx   . Stomach cancer Neg Hx   . Pancreatic cancer Neg Hx     Social History   Socioeconomic History  . Marital status: Single    Spouse name: Not on file  . Number of children: 1  . Years of education: Not on file  . Highest education level: Not on file  Occupational History  . Occupation: retired    Fish farm manager: [employer]  Tobacco Use  . Smoking status: Current Every Day Smoker  Packs/day: 0.50    Years: 53.00    Pack years: 26.50    Types: Cigarettes  . Smokeless tobacco: Never Used  . Tobacco comment: tobacco info given 04/27/2018  Vaping Use  . Vaping Use: Never used  Substance and Sexual Activity  . Alcohol use: Not Currently  . Drug use: No  . Sexual activity: Not Currently  Other Topics Concern  . Not on file  Social History Narrative   Divorced      1 child      Works at [employer] Strain: [employer]   . Difficulty of Paying Living Expenses: Not hard at all  Food Insecurity: No Food Insecurity  . Worried About Charity fundraiser in the Last Year: Never true  . Ran Out of Food in the Last Year: Never true  Transportation Needs: No Transportation Needs  . Lack of Transportation (Medical): No  . Lack of Transportation (Non-Medical): No  Physical Activity: Insufficiently  Active  . Days of Exercise per Week: 7 days  . Minutes of Exercise per Session: 10 min  Stress: Stress Concern Present  . Feeling of Stress : To some extent  Social Connections:   . Frequency of Communication with Friends and Family: Not on file  . Frequency of Social Gatherings with Friends and Family: Not on file  . Attends Religious Services: Not on file  . Active Member of Clubs or Organizations: Not on file  . Attends Archivist Meetings: Not on file  . Marital Status: Not on file  Intimate Partner Violence: Not At Risk  . Fear of Current or Ex-Partner: No  . Emotionally Abused: No  . Physically Abused: No  . Sexually Abused: No      Review of systems: All other review of systems negative except as mentioned in the HPI.   Physical Exam: Vitals:   01/14/20 1101  BP: 100/60  Pulse: 78  SpO2: 98%   Body mass index is 17.06 kg/m. Gen:      No acute distress HEENT:  sclera anicteric Abd:      soft, non-tender; no palpable masses, no distension Ext:    No edema Neuro: alert and oriented x 3, no asterixis Psych: normal mood and affect  Data Reviewed:  Reviewed labs, radiology imaging, old records and pertinent past GI work up   Assessment and Plan/Recommendations:  69 year old very pleasant female with history of nonalcoholic/metabolic and alcoholic cirrhosis decompensated by ascites and hepatic encephalopathy  Low meld score Check CBC, CMP, PT and INR. Will recalculate based on results from labs today  Severe iron deficiency anemia secondary to occult GI blood loss with history of AVMs: Improved She is getting IV iron infusion through hematology  Ascites: Clinically she does not appear to be volume overloaded Continue low-dose Lasix 20 mg daily and Aldactone 25 mg daily  Hepatic encephalopathy: Stable Continue Xifaxan and lactulose with goal 2-3 bowel movements daily  Esophageal varices: Small  HCC screening: She is up-to-date with normal AFP  and MRI liver negative for any new liver lesions  She is currently working with home hospice, has advanced directive in place  Flu vaccine  Return in 3 months or sooner if needed  This visit required 40 minutes of patient care (this includes precharting, chart review, review of results, face-to-face time used for counseling as well as treatment plan and follow-up. The patient was provided  an opportunity to ask questions and all were answered. The patient agreed with the plan and demonstrated an understanding of the instructions.  Damaris Hippo , MD    CC: Tower, [name], MD

## 2020-01-14 NOTE — Patient Instructions (Signed)
Go to the basement for labs today  Follow up in 3 months  Due to recent changes in healthcare laws, you may see the results of your imaging and laboratory studies on MyChart before your provider has had a chance to review them.  We understand that in some cases there may be results that are confusing or concerning to you. Not all laboratory results come back in the same time frame and the provider may be waiting for multiple results in order to interpret others.  Please give [place] 48 hours in order for your provider to thoroughly review all the results before contacting the office for clarification of your results.   I appreciate the  opportunity to care for you  Thank You   Harl Bowie , MD

## 2020-01-16 DIAGNOSIS — F332 Major depressive disorder, recurrent severe without psychotic features: Secondary | ICD-10-CM | POA: Diagnosis not present

## 2020-01-16 DIAGNOSIS — Z658 Other specified problems related to psychosocial circumstances: Secondary | ICD-10-CM | POA: Diagnosis not present

## 2020-01-16 DIAGNOSIS — F411 Generalized anxiety disorder: Secondary | ICD-10-CM | POA: Diagnosis not present

## 2020-01-17 ENCOUNTER — Ambulatory Visit
Admission: RE | Admit: 2020-01-17 | Discharge: 2020-01-17 | Disposition: A | Discharge status: Home / Self Care | Payer: Medicare HMO | Source: Ambulatory Visit | Attending: Family Medicine | Admitting: Family Medicine

## 2020-01-17 ENCOUNTER — Other Ambulatory Visit: Payer: Self-pay

## 2020-01-17 DIAGNOSIS — Z1231 Encounter for screening mammogram for malignant neoplasm of breast: Secondary | ICD-10-CM | POA: Diagnosis not present

## 2020-01-18 ENCOUNTER — Other Ambulatory Visit: Payer: Self-pay

## 2020-01-18 DIAGNOSIS — D509 Iron deficiency anemia, unspecified: Secondary | ICD-10-CM

## 2020-01-19 ENCOUNTER — Ambulatory Visit (INDEPENDENT_AMBULATORY_CARE_PROVIDER_SITE_OTHER): Payer: Medicare HMO

## 2020-01-19 ENCOUNTER — Other Ambulatory Visit: Payer: Self-pay

## 2020-01-19 DIAGNOSIS — Z23 Encounter for immunization: Secondary | ICD-10-CM

## 2020-01-21 ENCOUNTER — Telehealth: Payer: Self-pay | Admitting: Gastroenterology

## 2020-01-21 NOTE — Telephone Encounter (Signed)
Pt would like to speak with you at your earliest convenience. She has a question about Dr. Woodward Ku last ov.

## 2020-01-21 NOTE — Telephone Encounter (Signed)
Explained what CBC stood for.

## 2020-01-22 DIAGNOSIS — Z658 Other specified problems related to psychosocial circumstances: Secondary | ICD-10-CM | POA: Diagnosis not present

## 2020-01-22 DIAGNOSIS — F411 Generalized anxiety disorder: Secondary | ICD-10-CM | POA: Diagnosis not present

## 2020-01-22 DIAGNOSIS — F332 Major depressive disorder, recurrent severe without psychotic features: Secondary | ICD-10-CM | POA: Diagnosis not present

## 2020-01-24 ENCOUNTER — Other Ambulatory Visit: Payer: Self-pay

## 2020-01-24 ENCOUNTER — Ambulatory Visit
Admission: RE | Admit: 2020-01-24 | Discharge: 2020-01-24 | Disposition: A | Discharge status: Home / Self Care | Payer: Medicare HMO | Source: Ambulatory Visit | Attending: Nurse Practitioner | Admitting: Nurse Practitioner

## 2020-01-24 DIAGNOSIS — Z87891 Personal history of nicotine dependence: Secondary | ICD-10-CM | POA: Insufficient documentation

## 2020-01-24 DIAGNOSIS — Z122 Encounter for screening for malignant neoplasm of respiratory organs: Secondary | ICD-10-CM | POA: Insufficient documentation

## 2020-01-28 ENCOUNTER — Ambulatory Visit (INDEPENDENT_AMBULATORY_CARE_PROVIDER_SITE_OTHER): Payer: Medicare HMO | Admitting: Adult Health

## 2020-01-28 ENCOUNTER — Other Ambulatory Visit: Payer: Self-pay

## 2020-01-28 ENCOUNTER — Encounter: Payer: Self-pay | Admitting: Adult Health

## 2020-01-28 VITALS — BP 146/71 | HR 87 | Ht 65.0 in | Wt 102.0 lb

## 2020-01-28 DIAGNOSIS — G4700 Insomnia, unspecified: Secondary | ICD-10-CM | POA: Diagnosis not present

## 2020-01-28 DIAGNOSIS — F411 Generalized anxiety disorder: Secondary | ICD-10-CM | POA: Diagnosis not present

## 2020-01-28 MED ORDER — CLORAZEPATE DIPOTASSIUM 3.75 MG PO TABS: 3.7500 mg | ORAL_TABLET | Freq: Two times a day (BID) | ORAL | 2 refills | Status: DC | PRN

## 2020-01-28 NOTE — Progress Notes (Signed)
[facility] MD/PA/NP Initial Note  01/28/2020 2:02 PM Christine Barton  MRN:  [id number]  Chief Complaint:   HPI:   Describes mood today as "ok". Pleasant. Mood symptoms - reports depression, anxiety, and irritability. Feels more anxious overall. Stating "I have a liver disease and it will kill me". Stating "I'm trying to make the most of the time left". Was initially given a year and a half to live. Has received a medication to "give me more time". Trying to think of the positive things and not dwell. Doing everything she can to "stay healthy as I can". Would like to have something for anxiety to take as needed. 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 Stable interest and motivation. Taking medications as prescribed.  Energy levels stable. Active, does not have a regular exercise routine. Walking. Enjoys some usual interests and activities. Lives alone with 8 cats. Spending time with family. Talking to chaplain. Appetite adequate. Weight loss 20 to 25 pounds over past 2 years with liver disease.  Sleeps well for the most part. Averages 7 to 8 hours. Focus and concentration pretty good. Completing tasks. Managing aspects of household. Retired from [employer]. Denies SI or HI.  Denies AH or VH.  Previous medication trials: Denies  Visit Diagnosis:    ICD-10-CM   1. Generalized anxiety disorder  F41.1 clorazepate (TRANXENE) 3.75 MG tablet  2. Insomnia, unspecified type  G47.00     Past Psychiatric History: Denies psychiatric hospitalization.  Past Medical History:  Past Medical History:  Diagnosis Date  . Alcohol abuse, unspecified   . Breast cancer ([place]) 1998   Right  . Cataract    left eye  . Cervical spondylosis 2006   MRI  . Degeneration of cervical intervertebral disc 2006   MRI  . Diabetes mellitus without complication ([place])   . Diverticulosis of colon (without mention of hemorrhage)   . Hyperpotassemia   . Microscopic  hematuria   . Mononeuritis of unspecified site   . Nonspecific abnormal results of liver function study   . Other abnormal glucose   . Other and unspecified hyperlipidemia    no per pt  . Other chronic nonalcoholic liver disease   . Personal history of chemotherapy   . Personal history of malignant neoplasm of breast   . Personal history of radiation therapy   . Pneumothorax, acute    right, spontaneous  . Tobacco use disorder   . Unspecified vitamin D deficiency     Past Surgical History:  Procedure Laterality Date  . BIOPSY  07/27/2019   Procedure: BIOPSY;  Surgeon: Yetta Flock, MD;  Location: [facility];  Service: Gastroenterology;;  . BREAST BIOPSY  9/03   Right  . BREAST LUMPECTOMY Right 1998  . BREAST LUMPECTOMY  1998  . CHEST TUBE INSERTION  11/02/2014  . COLONOSCOPY    . COLONOSCOPY WITH PROPOFOL N/A 07/27/2019   Procedure: COLONOSCOPY WITH PROPOFOL;  Surgeon: Yetta Flock, MD;  Location: [facility];  Service: Gastroenterology;  Laterality: N/A;  . ESOPHAGOGASTRODUODENOSCOPY (EGD) WITH PROPOFOL N/A 10/30/2018   Procedure: ESOPHAGOGASTRODUODENOSCOPY (EGD) WITH PROPOFOL;  Surgeon: Mauri Pole, MD;  Location: [facility];  Service: Endoscopy;  Laterality: N/A;  . ESOPHAGOGASTRODUODENOSCOPY (EGD) WITH PROPOFOL N/A 03/12/2019   Procedure: ESOPHAGOGASTRODUODENOSCOPY (EGD) WITH PROPOFOL;  Surgeon: Mauri Pole, MD;  Location: [facility];  Service: Endoscopy;  Laterality: N/A;  . ESOPHAGOGASTRODUODENOSCOPY (EGD) WITH PROPOFOL N/A 07/27/2019   Procedure: ESOPHAGOGASTRODUODENOSCOPY (  EGD) WITH PROPOFOL;  Surgeon: Yetta Flock, MD;  Location: [facility];  Service: Gastroenterology;  Laterality: N/A;  . EYE SURGERY  02/2017   cataract extraction with lens implant-left  . HOT HEMOSTASIS N/A 07/27/2019   Procedure: HOT HEMOSTASIS (ARGON PLASMA COAGULATION/BICAP);  Surgeon: Yetta Flock, MD;  Location: [facility];  Service:  Gastroenterology;  Laterality: N/A;  . MOUTH SURGERY    . POLYPECTOMY  07/27/2019   Procedure: POLYPECTOMY;  Surgeon: Yetta Flock, MD;  Location: [facility];  Service: Gastroenterology;;  . TUBAL LIGATION      Family Psychiatric History: Denies any family history of mental illness.  Family History:  Family History  Problem Relation Age of Onset  . Heart failure Father   . Heart attack Father   . Colon cancer Maternal Uncle   . Stroke Mother   . Esophageal cancer Neg Hx   . Rectal cancer Neg Hx   . Stomach cancer Neg Hx   . Pancreatic cancer Neg Hx     Social History:  Social History   Socioeconomic History  . Marital status: Single    Spouse name: Not on file  . Number of children: 1  . Years of education: Not on file  . Highest education level: Not on file  Occupational History  . Occupation: retired    Fish farm manager: [employer]  Tobacco Use  . Smoking status: Current Every Day Smoker    Packs/day: 0.50    Years: 53.00    Pack years: 26.50    Types: Cigarettes  . Smokeless tobacco: Never Used  . Tobacco comment: tobacco info given 04/27/2018  Vaping Use  . Vaping Use: Never used  Substance and Sexual Activity  . Alcohol use: Not Currently  . Drug use: No  . Sexual activity: Not Currently  Other Topics Concern  . Not on file  Social History Narrative   Divorced      1 child      Works at [employer] Strain: [place]   . Difficulty of Paying Living Expenses: Not hard at all  Food Insecurity: No Food Insecurity  . Worried About Charity fundraiser in the Last Year: Never true  . Ran Out of Food in the Last Year: Never true  Transportation Needs: No Transportation Needs  . Lack of Transportation (Medical): No  . Lack of Transportation (Non-Medical): No  Physical Activity: Insufficiently Active  . Days of Exercise per Week: 7 days  . Minutes of Exercise per Session: 10 min   Stress: Stress Concern Present  . Feeling of Stress : To some extent  Social Connections:   . Frequency of Communication with Friends and Family: Not on file  . Frequency of Social Gatherings with Friends and Family: Not on file  . Attends Religious Services: Not on file  . Active Member of Clubs or Organizations: Not on file  . Attends Archivist Meetings: Not on file  . Marital Status: Not on file    Allergies:  Allergies  Allergen Reactions  . Oxycodone Nausea And Vomiting  . Glipizide Other (See Comments)    Stomach pain    Metabolic Disorder Labs: Lab Results  Component Value Date   HGBA1C 6.2 11/02/2019   MPG 145.59 07/25/2019   No results found for: PROLACTIN Lab Results  Component Value Date   CHOL 90 11/02/2019   TRIG 92.0 11/02/2019  HDL 37.70 (L) 11/02/2019   CHOLHDL 2 11/02/2019   VLDL 18.4 11/02/2019   LDLCALC 34 11/02/2019   LDLCALC 41 07/31/2019   Lab Results  Component Value Date   TSH 3.95 11/02/2019   TSH 2.487 07/26/2019    Therapeutic Level Labs: No results found for: LITHIUM No results found for: VALPROATE No components found for:  CBMZ  Current Medications: Current Outpatient Medications  Medication Sig Dispense Refill  . albuterol (VENTOLIN HFA) 108 (90 Base) MCG/ACT inhaler INHALE 2 PUFFS INTO THE LUNGS EVERY 4 (FOUR) HOURS AS NEEDED FOR WHEEZING OR SHORTNESS OF BREATH. 18 g 3  . budesonide-formoterol (SYMBICORT) 160-4.5 MCG/ACT inhaler Inhale 2 puffs into the lungs 2 (two) times daily. 1 each 11  . cholecalciferol (VITAMIN D3) 25 MCG (1000 UNIT) tablet Take 1,000 Units by mouth 2 (two) times daily.    . clorazepate (TRANXENE) 3.75 MG tablet Take 1 tablet (3.75 mg total) by mouth 2 (two) times daily as needed for anxiety. 60 tablet 2  . furosemide (LASIX) 20 MG tablet Take 1 tablet (20 mg total) by mouth daily. 90 tablet 1  . lactulose (CHRONULAC) 10 GM/15ML solution Take 15 mLs (10 g total) by mouth 3 (three) times daily.  236 mL 11  . metFORMIN (GLUCOPHAGE-XR) 500 MG 24 hr tablet TAKE 1 TABLET BY MOUTH EVERY DAY WITH BREAKFAST 90 tablet 0  . Multiple Vitamin (MULTIVITAMIN) tablet Take 1 tablet by mouth daily.    . Multiple Vitamins-Minerals (PRESERVISION AREDS 2) CAPS Take 1 capsule by mouth 2 (two) times daily.    . mupirocin ointment (BACTROBAN) 2 % APPLY 1 APPLICATION TOPICALLY 2 (TWO) TIMES DAILY. TO AFFECTED AREAS 22 g 2  . nadolol (CORGARD) 20 MG tablet TAKE 1 TABLET BY MOUTH EVERY DAY 90 tablet 3  . nystatin (MYCOSTATIN) 100000 UNIT/ML suspension TAKE 5 MLS (500,000 UNITS TOTAL) BY MOUTH 3 (THREE) TIMES DAILY. SWISH AND SWALLOW 120 mL 0  . pantoprazole (PROTONIX) 40 MG tablet TAKE 1 TABLET (40 MG TOTAL) BY MOUTH 2 (TWO) TIMES DAILY BEFORE A MEAL. 180 tablet 1  . Polyethyl Glycol-Propyl Glycol (SYSTANE OP) Place 1 drop into the left eye daily as needed (dryness).    . rosuvastatin (CRESTOR) 5 MG tablet TAKE 1 TABLET (5 MG TOTAL) BY MOUTH DAILY AT 6 PM. 90 tablet 3  . spironolactone (ALDACTONE) 25 MG tablet Take 1 tablet (25 mg total) by mouth daily. 30 tablet 1  . SYSTANE COMPLETE 0.6 % SOLN Apply 1 drop to eye 2 (two) times daily.    . temazepam (RESTORIL) 7.5 MG capsule Take 1 capsule (7.5 mg total) by mouth at bedtime as needed for sleep. 30 capsule 0  . XIFAXAN 550 MG TABS tablet TAKE 1 TABLET BY MOUTH 2 TIMES DAILY. 60 tablet 3   No current facility-administered medications for this visit.     Medication Side Effects: none  Orders placed this visit:  No orders of the defined types were placed in this encounter.   Psychiatric Specialty Exam:  Review of Systems  Blood pressure (!) 146/71, pulse 87, height 5' 5"  (1.651 m), weight 102 lb (46.3 kg).Body mass index is 16.97 kg/m.  General Appearance: Casual and Well Groomed  Eye Contact:  Good  Speech:  Clear and Coherent and Normal Rate  Volume:  Normal  Mood:  Anxious, Depressed and Irritable  Affect:  Appropriate and Congruent  Thought  Process:  Coherent  Orientation:  Full (Time, Place, and Person)  Thought Content: Logical  Suicidal Thoughts:  No  Homicidal Thoughts:  No  Memory:  WNL  Judgement:  Good  Insight:  Good  Psychomotor Activity:  Normal  Concentration:  Concentration: Good  Recall:  Good  Fund of Knowledge: Good  Language: Good  Assets:  Communication Skills Desire for Improvement Financial Resources/Insurance Housing Intimacy Leisure Time Physical Health Resilience Social Support Talents/Skills Transportation Vocational/Educational  ADL's:  Intact  Cognition: WNL  Prognosis:  Good   Screenings:  Mini-Mental     Clinical Support from 11/06/2018 in [place] at [place] from 10/31/2017 in [place] at [facility]  Total Score (max 30 points ) 17 20    PHQ2-9     Clinical Support from 11/07/2019 in [place] at [place] from 11/06/2018 in [place] at [place] from 10/31/2017 in [place] at [facility] from 10/26/2016 in [place] at [facility] Total Score 6 0 0 0  PHQ-9 Total Score 6 3 0 3      Receiving Psychotherapy: No   Treatment Plan/Recommendations:   Plan: PDMP reviewed  1. Add Tranxene 3.75 BID prn anxiety  RTC 3/6 months  Patient advised to contact office with any questions, adverse effects, or acute worsening in signs and symptoms.      Aloha Gell, NP

## 2020-01-29 ENCOUNTER — Encounter: Payer: Self-pay | Admitting: *Deleted

## 2020-01-31 ENCOUNTER — Telehealth: Payer: Self-pay | Admitting: Cardiology

## 2020-01-31 ENCOUNTER — Telehealth: Payer: Self-pay | Admitting: Gastroenterology

## 2020-01-31 DIAGNOSIS — Z658 Other specified problems related to psychosocial circumstances: Secondary | ICD-10-CM | POA: Diagnosis not present

## 2020-01-31 DIAGNOSIS — F332 Major depressive disorder, recurrent severe without psychotic features: Secondary | ICD-10-CM | POA: Diagnosis not present

## 2020-01-31 DIAGNOSIS — R0600 Dyspnea, unspecified: Secondary | ICD-10-CM

## 2020-01-31 DIAGNOSIS — I2510 Atherosclerotic heart disease of native coronary artery without angina pectoris: Secondary | ICD-10-CM

## 2020-01-31 DIAGNOSIS — R0609 Other forms of dyspnea: Secondary | ICD-10-CM

## 2020-01-31 DIAGNOSIS — F411 Generalized anxiety disorder: Secondary | ICD-10-CM | POA: Diagnosis not present

## 2020-01-31 NOTE — Telephone Encounter (Signed)
error 

## 2020-01-31 NOTE — Telephone Encounter (Signed)
Yes let's go ahead and get the [employer]

## 2020-01-31 NOTE — Telephone Encounter (Signed)
New message:     Patient calling stating that she is now ready to do the the stress test the doctor spoke with her about/

## 2020-02-01 ENCOUNTER — Other Ambulatory Visit (INDEPENDENT_AMBULATORY_CARE_PROVIDER_SITE_OTHER): Payer: Medicare HMO

## 2020-02-01 DIAGNOSIS — D509 Iron deficiency anemia, unspecified: Secondary | ICD-10-CM

## 2020-02-01 LAB — CBC WITH DIFFERENTIAL/PLATELET
Basophils Absolute: 0.1 10*3/uL (ref 0.0–0.1)
Basophils Relative: 0.7 % (ref 0.0–3.0)
Eosinophils Absolute: 0.1 10*3/uL (ref 0.0–0.7)
Eosinophils Relative: 0.9 % (ref 0.0–5.0)
HCT: 28.9 % — ABNORMAL LOW (ref 36.0–46.0)
Hemoglobin: 9.4 g/dL — ABNORMAL LOW (ref 12.0–15.0)
Lymphocytes Relative: 20.9 % (ref 12.0–46.0)
Lymphs Abs: 1.8 10*3/uL (ref 0.7–4.0)
MCHC: 32.6 g/dL (ref 30.0–36.0)
MCV: 80.8 fl (ref 78.0–100.0)
Monocytes Absolute: 0.8 10*3/uL (ref 0.1–1.0)
Monocytes Relative: 9.3 % (ref 3.0–12.0)
Neutro Abs: 5.8 10*3/uL (ref 1.4–7.7)
Neutrophils Relative %: 68.2 % (ref 43.0–77.0)
Platelets: 187 10*3/uL (ref 150.0–400.0)
RBC: 3.58 Mil/uL — ABNORMAL LOW (ref 3.87–5.11)
RDW: 20.4 % — ABNORMAL HIGH (ref 11.5–15.5)
WBC: 8.5 10*3/uL (ref 4.0–10.5)

## 2020-02-01 NOTE — Telephone Encounter (Signed)
Checking back on request to have labs done today. Feels weak and does not want to wait for the weekend please.

## 2020-02-01 NOTE — Telephone Encounter (Signed)
Order placed. Will have scheduler call to arrange

## 2020-02-01 NOTE — Telephone Encounter (Signed)
Patient is due for repeat CBC next week. She has felt extremely tired in the past few days. She is thinking clearly. Not falling asleep during the day. Just tired. Admits to having increased her activities because she has felt so much better after her iron infusion. She has recently taken on a new colony of feral cats and helping 2 elderly neighbors.  Patient wants to come for the CBC today instead of waiting until next week. Denies tarry dark stools, blood in her urine, nose bleeds or other new concerns. Overall "feeling the best I have in a long time."

## 2020-02-01 NOTE — Telephone Encounter (Signed)
Its fine to do labs today instead. thanks

## 2020-02-04 ENCOUNTER — Telehealth: Payer: Self-pay | Admitting: Medical Oncology

## 2020-02-04 ENCOUNTER — Telehealth: Payer: Self-pay | Admitting: *Deleted

## 2020-02-04 NOTE — Telephone Encounter (Signed)
She said her GI provider wants her to get iron before she sees Mohamed nov 4th . Her main symptom is " I feel tired"

## 2020-02-04 NOTE — Telephone Encounter (Addendum)
Patient called stating that there has been so many recalls on medications recently that she decided to do some research on the Internet. Patent stated on the Internet it shows that Nadolol was discontinued by the manufacturer in 05/2014 because of commercial reasons .  Patient stated that she is confused because she is currently taking the medication. Patient stated that she does not like to question what Dr. Glori Bickers does, but would like her opinion.

## 2020-02-04 NOTE — Telephone Encounter (Signed)
Sounds like they must have had an issue that was fixed because it is on the market and used regularly.  I am not the px physician for this medicine  I have other patients also on this medication without problems

## 2020-02-05 ENCOUNTER — Telehealth: Payer: Self-pay | Admitting: Cardiology

## 2020-02-05 DIAGNOSIS — F332 Major depressive disorder, recurrent severe without psychotic features: Secondary | ICD-10-CM | POA: Diagnosis not present

## 2020-02-05 DIAGNOSIS — Z658 Other specified problems related to psychosocial circumstances: Secondary | ICD-10-CM | POA: Diagnosis not present

## 2020-02-05 DIAGNOSIS — F411 Generalized anxiety disorder: Secondary | ICD-10-CM | POA: Diagnosis not present

## 2020-02-05 NOTE — Telephone Encounter (Signed)
We have to see her first and repeat her iron study.  Thank you.

## 2020-02-05 NOTE — Telephone Encounter (Signed)
Patient states she has a lung screening at [facility] on 01/24/20 and when it resulted, the provider who read it mentioned she had some hardening of her arteries. Patient is requesting to have Dr. Gardiner Rhyme review these results.

## 2020-02-05 NOTE — Telephone Encounter (Signed)
Spoke to patient, advised coronary calcifications known from previous CT scan and upcoming stress test will give more information if this is significant (limiting blood flow) or not.  She is aware and just wanted to let Dr. Gardiner Rhyme know she had the CT so he could look at the result if needed.  Advised would be in touch with stress test results once completed.   Patient verbalized understanding and no further questions at this time.

## 2020-02-05 NOTE — Telephone Encounter (Signed)
Spoke with patient. She wants Dr. Gardiner Rhyme to review the cardiovascular findings of her chest CT.   They state "Cardiovascular: The heart size is normal. No substantial pericardial effusion. Coronary artery calcification is evident. Atherosclerotic calcification is noted in the wall of the thoracic aorta."  She would like to know what it means, she is not in a hurry and said it can wait until next week when she has her stress test if that is easier. Will route to Dr. Gardiner Rhyme and nurse [facility] for review.

## 2020-02-05 NOTE — Telephone Encounter (Signed)
Pt notified of Dr. Marliss Coots comments and verbalized understanding

## 2020-02-06 ENCOUNTER — Other Ambulatory Visit: Payer: Self-pay | Admitting: Medical Oncology

## 2020-02-06 ENCOUNTER — Telehealth: Payer: Self-pay | Admitting: Adult Health Nurse Practitioner

## 2020-02-06 NOTE — Telephone Encounter (Signed)
Returned patient's VM wanting to reschedule Friday's appointment.  She did state that she had some questions about her disease process and advance directives and would like to keep our appointment for Friday, 02/08/20 @1  pm Layton Naves K. Olena Heckle NP

## 2020-02-06 NOTE — Telephone Encounter (Signed)
Pt notified.and schedule message sent.

## 2020-02-07 NOTE — Telephone Encounter (Signed)
Agree with plan 

## 2020-02-08 ENCOUNTER — Other Ambulatory Visit: Payer: Medicare HMO | Admitting: Adult Health Nurse Practitioner

## 2020-02-08 ENCOUNTER — Other Ambulatory Visit: Payer: Self-pay

## 2020-02-08 ENCOUNTER — Telehealth (HOSPITAL_COMMUNITY): Payer: Self-pay | Admitting: *Deleted

## 2020-02-08 DIAGNOSIS — Z515 Encounter for palliative care: Secondary | ICD-10-CM

## 2020-02-08 DIAGNOSIS — M25561 Pain in right knee: Secondary | ICD-10-CM

## 2020-02-08 DIAGNOSIS — D500 Iron deficiency anemia secondary to blood loss (chronic): Secondary | ICD-10-CM

## 2020-02-08 DIAGNOSIS — K7030 Alcoholic cirrhosis of liver without ascites: Secondary | ICD-10-CM

## 2020-02-08 NOTE — Progress Notes (Signed)
Christine Barton Consult Note Telephone: [phone]  Fax: [phone]  PATIENT NAME: Christine Barton DOB: Jan 28, 1951 MRN: [id number]  PRIMARY CARE PROVIDER:   [name], MD  REFERRING PROVIDER:  [name], MD [place],  [place] [address]  RESPONSIBLE PARTY:   Self [phone]   RECOMMENDATIONS and PLAN:  1.  Advanced care planning.  Patient had questions about MOST form and we had lengthy discussion about CPR vs passing naturally and options for limited vs full hospital interventions.  Discussed that this form could be changed at any time based on any changes in her health status and her wishes.  She wanted to think more about her decisions and will fill out at next appointment  2.  Cirrhosis of the liver/anemia. Patient has been trying to help elderly neighbors.  Feels like she has over worked herself and is now feeling weak and is having increased SOB.  Has talked with her provider about this and wants her to see her oncologist, Dr. Earlie Server.  She has an appointment with Dr. Earlie Server next Thursday.  Went over that if she started feeling worse over the weekend to get evaluated at an Urgent care or at the hospital.  She had questions about her disease progression and we had lengthy discussion on symptoms to expect with her disease progression and when it would be time to expect to need help in the home.  Patient is having acute weakness related to her anemia.  Denies fever, cough, abdominal pain, N/V/D, constipation, dysuria. She is not having any hematuria, melena, hematochezia, or any abnormal blood loss.  More than likely due to over exertion.  Continue follow up and recommendations by Dr. Earlie Server  3.  Right knee pain.  Patient hit her right knee on door earlier this afternoon and is starting to have knee pain.  States that there is no ecchymosis or swelling.  Discussed that she may start having some swelling and  ecchymosis and that she can take Tylenol for the pain and use ice packs to help with swelling.    4.  Support.  Patient has been seeing a psychologist and states that this has been helpful.  She is planning on talking with someone who can help her use her home to help pay for in home help when she needs it.    Palliative will continue to monitor for symptom management/decline and make recommendations as needed.  Have appointment in 4 weeks  I spent 90 minutes providing this consultation,  from 1:00 to 2:30 including time spent with patient, chart review, provider coordination, documentation. More than 50% of the time in this consultation was spent coordinating communication.   HISTORY OF PRESENT ILLNESS:  Christine Barton is a 69 y.o. year old female with multiple medical problems including cirrhosis of the liver, anemia, COPD, AVM of colon, esophageal varices, diverticulosis of colon, DM T2,h/oright breast cancer. Palliative Care was asked to help address goals of care.   CODE STATUS: see above  PPS: 70% HOSPICE ELIGIBILITY/DIAGNOSIS: TBD  PHYSICAL EXAM:   General: NAD, frail appearing, thin Extremities: no edema, no joint deformities Skin: no rasheson exposed skin Neurological: nonfocal.  A&O x3  PAST MEDICAL HISTORY:  Past Medical History:  Diagnosis Date   Alcohol abuse, unspecified    Breast cancer ([place]) 1998   Right   Cataract    left eye   Cervical spondylosis 2006   MRI   Degeneration  of cervical intervertebral disc 2006   MRI   Diabetes mellitus without complication ([facility])    Diverticulosis of colon (without mention of hemorrhage)    Hyperpotassemia    Microscopic hematuria    Mononeuritis of unspecified site    Nonspecific abnormal results of liver function study    Other abnormal glucose    Other and unspecified hyperlipidemia    no per pt   Other chronic nonalcoholic liver disease    Personal history of chemotherapy    Personal history of  malignant neoplasm of breast    Personal history of radiation therapy    Pneumothorax, acute    right, spontaneous   Tobacco use disorder    Unspecified vitamin D deficiency     SOCIAL HX:  Social History   Tobacco Use   Smoking status: Current Every Day Smoker    Packs/day: 0.50    Years: 53.00    Pack years: 26.50    Types: Cigarettes   Smokeless tobacco: Never Used   Tobacco comment: tobacco info given 04/27/2018  Substance Use Topics   Alcohol use: Not Currently    ALLERGIES:  Allergies  Allergen Reactions   Oxycodone Nausea And Vomiting   Glipizide Other (See Comments)    Stomach pain     PERTINENT MEDICATIONS:  Outpatient Encounter Medications as of 02/08/2020  Medication Sig   albuterol (VENTOLIN HFA) 108 (90 Base) MCG/ACT inhaler INHALE 2 PUFFS INTO THE LUNGS EVERY 4 (FOUR) HOURS AS NEEDED FOR WHEEZING OR SHORTNESS OF BREATH.   budesonide-formoterol (SYMBICORT) 160-4.5 MCG/ACT inhaler Inhale 2 puffs into the lungs 2 (two) times daily.   cholecalciferol (VITAMIN D3) 25 MCG (1000 UNIT) tablet Take 1,000 Units by mouth 2 (two) times daily.   clorazepate (TRANXENE) 3.75 MG tablet Take 1 tablet (3.75 mg total) by mouth 2 (two) times daily as needed for anxiety.   furosemide (LASIX) 20 MG tablet Take 1 tablet (20 mg total) by mouth daily.   lactulose (CHRONULAC) 10 GM/15ML solution Take 15 mLs (10 g total) by mouth 3 (three) times daily.   metFORMIN (GLUCOPHAGE-XR) 500 MG 24 hr tablet TAKE 1 TABLET BY MOUTH EVERY DAY WITH BREAKFAST   Multiple Vitamin (MULTIVITAMIN) tablet Take 1 tablet by mouth daily.   Multiple Vitamins-Minerals (PRESERVISION AREDS 2) CAPS Take 1 capsule by mouth 2 (two) times daily.   mupirocin ointment (BACTROBAN) 2 % APPLY 1 APPLICATION TOPICALLY 2 (TWO) TIMES DAILY. TO AFFECTED AREAS   nadolol (CORGARD) 20 MG tablet TAKE 1 TABLET BY MOUTH EVERY DAY   nystatin (MYCOSTATIN) 100000 UNIT/ML suspension TAKE 5 MLS (500,000 UNITS TOTAL)  BY MOUTH 3 (THREE) TIMES DAILY. SWISH AND SWALLOW   pantoprazole (PROTONIX) 40 MG tablet TAKE 1 TABLET (40 MG TOTAL) BY MOUTH 2 (TWO) TIMES DAILY BEFORE A MEAL.   Polyethyl Glycol-Propyl Glycol (SYSTANE OP) Place 1 drop into the left eye daily as needed (dryness).   rosuvastatin (CRESTOR) 5 MG tablet TAKE 1 TABLET (5 MG TOTAL) BY MOUTH DAILY AT 6 PM.   spironolactone (ALDACTONE) 25 MG tablet Take 1 tablet (25 mg total) by mouth daily.   SYSTANE COMPLETE 0.6 % SOLN Apply 1 drop to eye 2 (two) times daily.   temazepam (RESTORIL) 7.5 MG capsule Take 1 capsule (7.5 mg total) by mouth at bedtime as needed for sleep.   XIFAXAN 550 MG TABS tablet TAKE 1 TABLET BY MOUTH 2 TIMES DAILY.   No facility-administered encounter medications on file as of 02/08/2020.    Arayla Kruschke K  Olena Heckle, NP

## 2020-02-08 NOTE — Telephone Encounter (Signed)
Close encounter 

## 2020-02-11 ENCOUNTER — Other Ambulatory Visit: Payer: Self-pay | Admitting: Family Medicine

## 2020-02-11 NOTE — Telephone Encounter (Signed)
Refill request Temazepam Last refill 01/10/20 #30 Last office visit 11/09/19

## 2020-02-12 ENCOUNTER — Ambulatory Visit (HOSPITAL_COMMUNITY)
Admission: RE | Admit: 2020-02-12 | Payer: Medicare HMO | Source: Ambulatory Visit | Attending: Cardiology | Admitting: Cardiology

## 2020-02-12 ENCOUNTER — Encounter: Payer: Self-pay | Admitting: Internal Medicine

## 2020-02-14 ENCOUNTER — Inpatient Hospital Stay: Payer: Medicare HMO | Attending: Physician Assistant | Admitting: Internal Medicine

## 2020-02-14 ENCOUNTER — Telehealth (HOSPITAL_COMMUNITY): Payer: Self-pay | Admitting: *Deleted

## 2020-02-14 ENCOUNTER — Other Ambulatory Visit: Payer: Self-pay

## 2020-02-14 ENCOUNTER — Inpatient Hospital Stay (HOSPITAL_BASED_OUTPATIENT_CLINIC_OR_DEPARTMENT_OTHER): Payer: Medicare HMO | Admitting: Internal Medicine

## 2020-02-14 ENCOUNTER — Encounter: Payer: Self-pay | Admitting: Internal Medicine

## 2020-02-14 VITALS — BP 121/64 | HR 64 | Temp 98.6°F | Resp 18 | Wt 105.0 lb

## 2020-02-14 DIAGNOSIS — D649 Anemia, unspecified: Secondary | ICD-10-CM

## 2020-02-14 DIAGNOSIS — K922 Gastrointestinal hemorrhage, unspecified: Secondary | ICD-10-CM | POA: Insufficient documentation

## 2020-02-14 DIAGNOSIS — Z853 Personal history of malignant neoplasm of breast: Secondary | ICD-10-CM | POA: Diagnosis not present

## 2020-02-14 DIAGNOSIS — E119 Type 2 diabetes mellitus without complications: Secondary | ICD-10-CM | POA: Insufficient documentation

## 2020-02-14 DIAGNOSIS — Z9221 Personal history of antineoplastic chemotherapy: Secondary | ICD-10-CM | POA: Diagnosis not present

## 2020-02-14 DIAGNOSIS — Z923 Personal history of irradiation: Secondary | ICD-10-CM | POA: Diagnosis not present

## 2020-02-14 DIAGNOSIS — D500 Iron deficiency anemia secondary to blood loss (chronic): Secondary | ICD-10-CM | POA: Diagnosis not present

## 2020-02-14 DIAGNOSIS — E785 Hyperlipidemia, unspecified: Secondary | ICD-10-CM | POA: Insufficient documentation

## 2020-02-14 DIAGNOSIS — Z7984 Long term (current) use of oral hypoglycemic drugs: Secondary | ICD-10-CM | POA: Insufficient documentation

## 2020-02-14 DIAGNOSIS — Z79899 Other long term (current) drug therapy: Secondary | ICD-10-CM | POA: Insufficient documentation

## 2020-02-14 LAB — IRON AND TIBC
Iron: 22 ug/dL — ABNORMAL LOW (ref 41–142)
Saturation Ratios: 4 % — ABNORMAL LOW (ref 21–57)
TIBC: 553 ug/dL — ABNORMAL HIGH (ref 236–444)
UIBC: 531 ug/dL — ABNORMAL HIGH (ref 120–384)

## 2020-02-14 LAB — CBC WITH DIFFERENTIAL (CANCER CENTER ONLY)
Abs Immature Granulocytes: 0.05 10*3/uL (ref 0.00–0.07)
Basophils Absolute: 0 10*3/uL (ref 0.0–0.1)
Basophils Relative: 1 %
Eosinophils Absolute: 0.1 10*3/uL (ref 0.0–0.5)
Eosinophils Relative: 2 %
HCT: 29.1 % — ABNORMAL LOW (ref 36.0–46.0)
Hemoglobin: 8.7 g/dL — ABNORMAL LOW (ref 12.0–15.0)
Immature Granulocytes: 1 %
Lymphocytes Relative: 17 %
Lymphs Abs: 1.2 10*3/uL (ref 0.7–4.0)
MCH: 24.4 pg — ABNORMAL LOW (ref 26.0–34.0)
MCHC: 29.9 g/dL — ABNORMAL LOW (ref 30.0–36.0)
MCV: 81.5 fL (ref 80.0–100.0)
Monocytes Absolute: 0.4 10*3/uL (ref 0.1–1.0)
Monocytes Relative: 6 %
Neutro Abs: 5 10*3/uL (ref 1.7–7.7)
Neutrophils Relative %: 73 %
Platelet Count: 136 10*3/uL — ABNORMAL LOW (ref 150–400)
RBC: 3.57 MIL/uL — ABNORMAL LOW (ref 3.87–5.11)
RDW: 18.3 % — ABNORMAL HIGH (ref 11.5–15.5)
WBC Count: 6.8 10*3/uL (ref 4.0–10.5)
nRBC: 0 % (ref 0.0–0.2)

## 2020-02-14 LAB — FERRITIN: Ferritin: 12 ng/mL (ref 11–307)

## 2020-02-14 NOTE — Progress Notes (Signed)
[facility] [phone]   [phone]  OFFICE PROGRESS NOTE  Tower, [name], MD Christine Barton [id number]  DIAGNOSIS: Anemia secondary to GI occult blood loss.   PRIOR THERAPY: Oral iron supplements. Discontinued due to intolerance (severe abdominal pain)  CURRENT THERAPY: IV iron infusions with venofer PRN. Last infusion on 12/14/2019  INTERVAL HISTORY: Christine Barton 69 y.o. female returns to the clinic today for follow-up visit.  The patient is feeling fine today except for the persistent fatigue and occasional dizzy spells.  She also has some craving for ice.  She denied having any chest pain, cough or hemoptysis.  She denied having any fever or chills.  She has no nausea, vomiting, diarrhea or constipation.  She has no headache or visual changes.  She was treated with Venofer infusion last dose was given December 14, 2019.  The patient is here today for evaluation and repeat blood work including CBC, iron study and ferritin.  MEDICAL HISTORY: Past Medical History:  Diagnosis Date  . Alcohol abuse, unspecified   . Breast cancer ([place]) 1998   Right  . Cataract    left eye  . Cervical spondylosis 2006   MRI  . Degeneration of cervical intervertebral disc 2006   MRI  . Diabetes mellitus without complication ([employer])   . Diverticulosis of colon (without mention of hemorrhage)   . Hyperpotassemia   . Microscopic hematuria   . Mononeuritis of unspecified site   . Nonspecific abnormal results of liver function study   . Other abnormal glucose   . Other and unspecified hyperlipidemia    no per pt  . Other chronic nonalcoholic liver disease   . Personal history of chemotherapy   . Personal history of malignant neoplasm of breast   . Personal history of radiation therapy   . Pneumothorax, acute    right, spontaneous  . Tobacco use disorder   . Unspecified vitamin D deficiency     ALLERGIES:  is allergic to oxycodone and  glipizide.  MEDICATIONS:  Current Outpatient Medications  Medication Sig Dispense Refill  . albuterol (VENTOLIN HFA) 108 (90 Base) MCG/ACT inhaler INHALE 2 PUFFS INTO THE LUNGS EVERY 4 (FOUR) HOURS AS NEEDED FOR WHEEZING OR SHORTNESS OF BREATH. 18 g 3  . budesonide-formoterol (SYMBICORT) 160-4.5 MCG/ACT inhaler Inhale 2 puffs into the lungs 2 (two) times daily. 1 each 11  . cholecalciferol (VITAMIN D3) 25 MCG (1000 UNIT) tablet Take 1,000 Units by mouth 2 (two) times daily.    . clorazepate (TRANXENE) 3.75 MG tablet Take 1 tablet (3.75 mg total) by mouth 2 (two) times daily as needed for anxiety. 60 tablet 2  . furosemide (LASIX) 20 MG tablet Take 1 tablet (20 mg total) by mouth daily. 90 tablet 1  . lactulose (CHRONULAC) 10 GM/15ML solution Take 15 mLs (10 g total) by mouth 3 (three) times daily. 236 mL 11  . metFORMIN (GLUCOPHAGE-XR) 500 MG 24 hr tablet TAKE 1 TABLET BY MOUTH EVERY DAY WITH BREAKFAST 90 tablet 0  . Multiple Vitamin (MULTIVITAMIN) tablet Take 1 tablet by mouth daily.    . Multiple Vitamins-Minerals (PRESERVISION AREDS 2) CAPS Take 1 capsule by mouth 2 (two) times daily.    . mupirocin ointment (BACTROBAN) 2 % APPLY 1 APPLICATION TOPICALLY 2 (TWO) TIMES DAILY. TO AFFECTED AREAS 22 g 2  . nadolol (CORGARD) 20 MG tablet TAKE 1 TABLET BY MOUTH EVERY DAY 90 tablet 3  . nystatin (  MYCOSTATIN) 100000 UNIT/ML suspension TAKE 5 MLS (500,000 UNITS TOTAL) BY MOUTH 3 (THREE) TIMES DAILY. SWISH AND SWALLOW 120 mL 0  . pantoprazole (PROTONIX) 40 MG tablet TAKE 1 TABLET (40 MG TOTAL) BY MOUTH 2 (TWO) TIMES DAILY BEFORE A MEAL. 180 tablet 1  . Polyethyl Glycol-Propyl Glycol (SYSTANE OP) Place 1 drop into the left eye daily as needed (dryness).    . rosuvastatin (CRESTOR) 5 MG tablet TAKE 1 TABLET (5 MG TOTAL) BY MOUTH DAILY AT 6 PM. 90 tablet 3  . spironolactone (ALDACTONE) 25 MG tablet Take 1 tablet (25 mg total) by mouth daily. 30 tablet 1  . SYSTANE COMPLETE 0.6 % SOLN Apply 1 drop to eye 2  (two) times daily.    . temazepam (RESTORIL) 7.5 MG capsule TAKE 1 CAPSULE (7.5 MG TOTAL) BY MOUTH AT BEDTIME AS NEEDED FOR SLEEP. 30 capsule 0  . XIFAXAN 550 MG TABS tablet TAKE 1 TABLET BY MOUTH 2 TIMES DAILY. 60 tablet 3   No current facility-administered medications for this visit.    SURGICAL HISTORY:  Past Surgical History:  Procedure Laterality Date  . BIOPSY  07/27/2019   Procedure: BIOPSY;  Surgeon: Yetta Flock, MD;  Location: [facility];  Service: Gastroenterology;;  . BREAST BIOPSY  9/03   Right  . BREAST LUMPECTOMY Right 1998  . BREAST LUMPECTOMY  1998  . CHEST TUBE INSERTION  11/02/2014  . COLONOSCOPY    . COLONOSCOPY WITH PROPOFOL N/A 07/27/2019   Procedure: COLONOSCOPY WITH PROPOFOL;  Surgeon: Yetta Flock, MD;  Location: [facility];  Service: Gastroenterology;  Laterality: N/A;  . ESOPHAGOGASTRODUODENOSCOPY (EGD) WITH PROPOFOL N/A 10/30/2018   Procedure: ESOPHAGOGASTRODUODENOSCOPY (EGD) WITH PROPOFOL;  Surgeon: Mauri Pole, MD;  Location: [facility];  Service: Endoscopy;  Laterality: N/A;  . ESOPHAGOGASTRODUODENOSCOPY (EGD) WITH PROPOFOL N/A 03/12/2019   Procedure: ESOPHAGOGASTRODUODENOSCOPY (EGD) WITH PROPOFOL;  Surgeon: Mauri Pole, MD;  Location: [facility];  Service: Endoscopy;  Laterality: N/A;  . ESOPHAGOGASTRODUODENOSCOPY (EGD) WITH PROPOFOL N/A 07/27/2019   Procedure: ESOPHAGOGASTRODUODENOSCOPY (EGD) WITH PROPOFOL;  Surgeon: Yetta Flock, MD;  Location: [facility];  Service: Gastroenterology;  Laterality: N/A;  . EYE SURGERY  02/2017   cataract extraction with lens implant-left  . HOT HEMOSTASIS N/A 07/27/2019   Procedure: HOT HEMOSTASIS (ARGON PLASMA COAGULATION/BICAP);  Surgeon: Yetta Flock, MD;  Location: [facility];  Service: Gastroenterology;  Laterality: N/A;  . MOUTH SURGERY    . POLYPECTOMY  07/27/2019   Procedure: POLYPECTOMY;  Surgeon: Yetta Flock, MD;  Location: [facility];  Service:  Gastroenterology;;  . TUBAL LIGATION      REVIEW OF SYSTEMS:  A comprehensive review of systems was negative except for: Constitutional: positive for fatigue Neurological: positive for dizziness   PHYSICAL EXAMINATION: General appearance: alert, cooperative, fatigued and no distress Head: Normocephalic, without obvious abnormality, atraumatic Neck: no adenopathy, no JVD, supple, symmetrical, trachea midline and thyroid not enlarged, symmetric, no tenderness/mass/nodules Lymph nodes: Cervical, supraclavicular, and axillary nodes normal. Resp: clear to auscultation bilaterally Back: symmetric, no curvature. ROM normal. No CVA tenderness. Cardio: regular rate and rhythm, S1, S2 normal, no murmur, click, rub or gallop GI: soft, non-tender; bowel sounds normal; no masses,  no organomegaly Extremities: extremities normal, atraumatic, no cyanosis or edema  ECOG PERFORMANCE STATUS: 1 - Symptomatic but completely ambulatory  Blood pressure 121/64, pulse 64, temperature 98.6 F (37 C), temperature source Tympanic, resp. rate 18, weight 105 lb (47.6 kg), SpO2 100 %.  LABORATORY DATA: Lab Results  Component Value Date   WBC 6.8 02/14/2020   HGB 8.7 (L) 02/14/2020   HCT 29.1 (L) 02/14/2020   MCV 81.5 02/14/2020   PLT 136 (L) 02/14/2020      Chemistry      Component Value Date/Time   NA 139 01/14/2020 1144   NA 140 12/11/2019 1617   K 3.9 01/14/2020 1144   CL 101 01/14/2020 1144   CO2 30 01/14/2020 1144   BUN 44 (H) 01/14/2020 1144   BUN 35 (H) 12/11/2019 1617   CREATININE 0.85 01/14/2020 1144      Component Value Date/Time   CALCIUM 10.1 01/14/2020 1144   ALKPHOS 126 (H) 01/14/2020 1144   AST 21 01/14/2020 1144   ALT 11 01/14/2020 1144   BILITOT 0.6 01/14/2020 1144   BILITOT 0.7 12/11/2019 1617       RADIOGRAPHIC STUDIES: MM 3D SCREEN BREAST BILATERAL  Result Date: 01/22/2020 CLINICAL DATA:  Screening. EXAM: DIGITAL SCREENING BILATERAL MAMMOGRAM WITH TOMO AND CAD  COMPARISON:  Previous exam(s). ACR Breast Density Category b: There are scattered areas of fibroglandular density. FINDINGS: There are no findings suspicious for malignancy. Images were processed with CAD. IMPRESSION: No mammographic evidence of malignancy. A result letter of this screening mammogram will be mailed directly to the patient. RECOMMENDATION: Screening mammogram in one year. (Code:SM-B-01Y) BI-RADS CATEGORY  1: Negative. Electronically Signed   By: Lovey Newcomer M.D.   On: 01/22/2020 15:50   CT CHEST LUNG CANCER SCREENING LOW DOSE WO CONTRAST  Result Date: 01/25/2020 CLINICAL DATA:  69 year old female with 54 pack-year history of smoking. Lung cancer screening. EXAM: CT CHEST WITHOUT CONTRAST LOW-DOSE FOR LUNG CANCER SCREENING TECHNIQUE: Multidetector CT imaging of the chest was performed following the standard protocol without IV contrast. COMPARISON:  01/18/2019 FINDINGS: Cardiovascular: The heart size is normal. No substantial pericardial effusion. Coronary artery calcification is evident. Atherosclerotic calcification is noted in the wall of the thoracic aorta. Mediastinum/Nodes: No mediastinal lymphadenopathy. No evidence for gross hilar lymphadenopathy although assessment is limited by the lack of intravenous contrast on today's study. The esophagus has normal imaging features. There is no axillary lymphadenopathy. Lungs/Pleura: Calcified subpleural scarring, likely sequelae anterior right apex of radiation is stable treatment. Centrilobular emphsyema noted. Previously identified scattered tiny bilateral pulmonary nodules are stable. No new suspicious pulmonary nodule or mass. No focal airspace consolidation. No pleural effusion. Upper Abdomen: Trace perihepatic free fluid noted. Tiny layering calcified gallstones are incompletely visualized. Musculoskeletal: No worrisome lytic or sclerotic osseous abnormality. Right mastectomy with surgical clips noted right axilla. IMPRESSION: 1. Lung-RADS 2,  benign appearance or behavior. Continue annual screening with low-dose chest CT without contrast in 12 months. 2. Trace perihepatic free fluid, similar. 3. Cholelithiasis. 4. Emphysema (ICD10-J43.9) and Aortic Atherosclerosis (ICD10-170.0) Electronically Signed   By: Misty Stanley M.D.   On: 01/25/2020 08:17    ASSESSMENT AND PLAN: This is a very pleasant 69 years old white female with iron deficiency anemia secondary to likely chronic blood loss secondary to AV malformation as well as lack of dietary iron supplements.  She has intolerance to the oral iron tablets. The patient was treated with Venofer infusion recently.  She has improvement in her anemia but the recent blood work showed decline again in her hemoglobin hematocrit as well as the iron and ferritin. I recommended for the patient to receive iron infusion again with Venofer 300 mg IV weekly for 3 doses. I will see her back for follow-up visit in 6 weeks for evaluation and repeat  blood work. She was advised to call immediately if she has any concerning symptoms in the interval. The patient voices understanding of current disease status and treatment options and is in agreement with the current care plan. All questions were answered. The patient knows to call the clinic with any problems, questions or concerns. We can certainly see the patient much sooner if necessary. The total time spent in the appointment was 20 minutes.  Disclaimer: This note was dictated with voice recognition software. Similar sounding words can inadvertently be transcribed and may not be corrected upon review.

## 2020-02-14 NOTE — Telephone Encounter (Signed)
Close encounter 

## 2020-02-15 ENCOUNTER — Ambulatory Visit (HOSPITAL_COMMUNITY)
Admission: RE | Admit: 2020-02-15 | Discharge: 2020-02-15 | Disposition: A | Discharge status: Home / Self Care | Payer: Medicare HMO | Source: Ambulatory Visit | Attending: Internal Medicine | Admitting: Internal Medicine

## 2020-02-15 DIAGNOSIS — R0600 Dyspnea, unspecified: Secondary | ICD-10-CM | POA: Diagnosis not present

## 2020-02-15 DIAGNOSIS — I2510 Atherosclerotic heart disease of native coronary artery without angina pectoris: Secondary | ICD-10-CM | POA: Diagnosis not present

## 2020-02-15 DIAGNOSIS — R0609 Other forms of dyspnea: Secondary | ICD-10-CM

## 2020-02-15 LAB — MYOCARDIAL PERFUSION IMAGING
LV dias vol: 89 mL (ref 46–106)
LV sys vol: 40 mL
Peak HR: 92 {beats}/min
Rest HR: 74 {beats}/min
SDS: 2
SRS: 3
SSS: 5
TID: 0.98

## 2020-02-15 MED ORDER — TECHNETIUM TC 99M TETROFOSMIN IV KIT
9.8000 | PACK | Freq: Once | INTRAVENOUS | Status: AC | PRN
  Administered 2020-02-15: 9.8 via INTRAVENOUS
  Filled 2020-02-15: qty 10

## 2020-02-15 MED ORDER — REGADENOSON 0.4 MG/5ML IV SOLN
0.4000 mg | Freq: Once | INTRAVENOUS | Status: AC
  Administered 2020-02-15: 0.4 mg via INTRAVENOUS

## 2020-02-15 MED ORDER — TECHNETIUM TC 99M TETROFOSMIN IV KIT
30.2000 | PACK | Freq: Once | INTRAVENOUS | Status: AC | PRN
  Administered 2020-02-15: 30.2 via INTRAVENOUS
  Filled 2020-02-15: qty 31

## 2020-02-19 ENCOUNTER — Telehealth: Payer: Self-pay

## 2020-02-19 NOTE — Telephone Encounter (Signed)
Pt LM wanting to know when her iron infusions would be scheduled for.  I contacted the scheduling department and pt will have her infusions once a week for 3 weeks starting 11/15.  I have spoken with the pt and advised of this. She expressed understanding of this information and advised she can see the appts on My Chart.

## 2020-02-25 ENCOUNTER — Other Ambulatory Visit: Payer: Self-pay

## 2020-02-25 ENCOUNTER — Inpatient Hospital Stay: Payer: Medicare HMO

## 2020-02-25 VITALS — BP 122/62 | HR 72 | Temp 98.6°F | Resp 18

## 2020-02-25 DIAGNOSIS — D500 Iron deficiency anemia secondary to blood loss (chronic): Secondary | ICD-10-CM

## 2020-02-25 DIAGNOSIS — Z7984 Long term (current) use of oral hypoglycemic drugs: Secondary | ICD-10-CM | POA: Diagnosis not present

## 2020-02-25 DIAGNOSIS — Z9221 Personal history of antineoplastic chemotherapy: Secondary | ICD-10-CM | POA: Diagnosis not present

## 2020-02-25 DIAGNOSIS — K922 Gastrointestinal hemorrhage, unspecified: Secondary | ICD-10-CM | POA: Diagnosis not present

## 2020-02-25 DIAGNOSIS — Z79899 Other long term (current) drug therapy: Secondary | ICD-10-CM | POA: Diagnosis not present

## 2020-02-25 DIAGNOSIS — E119 Type 2 diabetes mellitus without complications: Secondary | ICD-10-CM | POA: Diagnosis not present

## 2020-02-25 DIAGNOSIS — Z923 Personal history of irradiation: Secondary | ICD-10-CM | POA: Diagnosis not present

## 2020-02-25 DIAGNOSIS — E785 Hyperlipidemia, unspecified: Secondary | ICD-10-CM | POA: Diagnosis not present

## 2020-02-25 DIAGNOSIS — K5520 Angiodysplasia of colon without hemorrhage: Secondary | ICD-10-CM

## 2020-02-25 DIAGNOSIS — Z853 Personal history of malignant neoplasm of breast: Secondary | ICD-10-CM | POA: Diagnosis not present

## 2020-02-25 MED ORDER — SODIUM CHLORIDE 0.9 % IV SOLN
300.0000 mg | Freq: Once | INTRAVENOUS | Status: AC
  Administered 2020-02-25: 300 mg via INTRAVENOUS
  Filled 2020-02-25: qty 10

## 2020-02-25 MED ORDER — SODIUM CHLORIDE 0.9 % IV SOLN
Freq: Once | INTRAVENOUS | Status: AC
  Filled 2020-02-25: qty 250

## 2020-02-25 NOTE — Patient Instructions (Signed)

## 2020-02-25 NOTE — Progress Notes (Signed)
Patient declined post Venofer observation. Verbal education provided.

## 2020-02-27 ENCOUNTER — Other Ambulatory Visit: Payer: Self-pay | Admitting: Gastroenterology

## 2020-03-03 ENCOUNTER — Other Ambulatory Visit: Payer: Self-pay | Admitting: Gastroenterology

## 2020-03-03 ENCOUNTER — Inpatient Hospital Stay: Payer: Medicare HMO

## 2020-03-03 ENCOUNTER — Other Ambulatory Visit: Payer: Self-pay

## 2020-03-03 VITALS — BP 106/50 | HR 84 | Temp 97.6°F | Resp 18 | Ht 65.0 in | Wt 109.8 lb

## 2020-03-03 DIAGNOSIS — Z79899 Other long term (current) drug therapy: Secondary | ICD-10-CM | POA: Diagnosis not present

## 2020-03-03 DIAGNOSIS — K5520 Angiodysplasia of colon without hemorrhage: Secondary | ICD-10-CM

## 2020-03-03 DIAGNOSIS — Z923 Personal history of irradiation: Secondary | ICD-10-CM | POA: Diagnosis not present

## 2020-03-03 DIAGNOSIS — E785 Hyperlipidemia, unspecified: Secondary | ICD-10-CM | POA: Diagnosis not present

## 2020-03-03 DIAGNOSIS — Z9221 Personal history of antineoplastic chemotherapy: Secondary | ICD-10-CM | POA: Diagnosis not present

## 2020-03-03 DIAGNOSIS — Z7984 Long term (current) use of oral hypoglycemic drugs: Secondary | ICD-10-CM | POA: Diagnosis not present

## 2020-03-03 DIAGNOSIS — K922 Gastrointestinal hemorrhage, unspecified: Secondary | ICD-10-CM | POA: Diagnosis not present

## 2020-03-03 DIAGNOSIS — Z853 Personal history of malignant neoplasm of breast: Secondary | ICD-10-CM | POA: Diagnosis not present

## 2020-03-03 DIAGNOSIS — D500 Iron deficiency anemia secondary to blood loss (chronic): Secondary | ICD-10-CM | POA: Diagnosis not present

## 2020-03-03 DIAGNOSIS — E119 Type 2 diabetes mellitus without complications: Secondary | ICD-10-CM | POA: Diagnosis not present

## 2020-03-03 MED ORDER — SODIUM CHLORIDE 0.9 % IV SOLN
Freq: Once | INTRAVENOUS | Status: AC
  Filled 2020-03-03: qty 250

## 2020-03-03 MED ORDER — SODIUM CHLORIDE 0.9 % IV SOLN
300.0000 mg | Freq: Once | INTRAVENOUS | Status: AC
  Administered 2020-03-03: 300 mg via INTRAVENOUS
  Filled 2020-03-03: qty 10

## 2020-03-03 NOTE — Patient Instructions (Signed)

## 2020-03-03 NOTE — Progress Notes (Signed)
Patient did not wish to stay for 30-minute observation period. VSS retaken and remained stable.  Patient noted that she felt fine and has never had any issues with the iron infusions. Patient educated and informed to call the cancer center if she had any problems.

## 2020-03-10 ENCOUNTER — Inpatient Hospital Stay: Payer: Medicare HMO

## 2020-03-10 ENCOUNTER — Other Ambulatory Visit: Payer: Self-pay

## 2020-03-10 VITALS — BP 119/53 | HR 75 | Temp 97.8°F | Resp 18

## 2020-03-10 DIAGNOSIS — D500 Iron deficiency anemia secondary to blood loss (chronic): Secondary | ICD-10-CM

## 2020-03-10 DIAGNOSIS — Z79899 Other long term (current) drug therapy: Secondary | ICD-10-CM | POA: Diagnosis not present

## 2020-03-10 DIAGNOSIS — Z9221 Personal history of antineoplastic chemotherapy: Secondary | ICD-10-CM | POA: Diagnosis not present

## 2020-03-10 DIAGNOSIS — E785 Hyperlipidemia, unspecified: Secondary | ICD-10-CM | POA: Diagnosis not present

## 2020-03-10 DIAGNOSIS — K922 Gastrointestinal hemorrhage, unspecified: Secondary | ICD-10-CM | POA: Diagnosis not present

## 2020-03-10 DIAGNOSIS — Z853 Personal history of malignant neoplasm of breast: Secondary | ICD-10-CM | POA: Diagnosis not present

## 2020-03-10 DIAGNOSIS — K5520 Angiodysplasia of colon without hemorrhage: Secondary | ICD-10-CM

## 2020-03-10 DIAGNOSIS — Z923 Personal history of irradiation: Secondary | ICD-10-CM | POA: Diagnosis not present

## 2020-03-10 DIAGNOSIS — Z7984 Long term (current) use of oral hypoglycemic drugs: Secondary | ICD-10-CM | POA: Diagnosis not present

## 2020-03-10 DIAGNOSIS — E119 Type 2 diabetes mellitus without complications: Secondary | ICD-10-CM | POA: Diagnosis not present

## 2020-03-10 MED ORDER — SODIUM CHLORIDE 0.9% FLUSH
3.0000 mL | Freq: Once | INTRAVENOUS | Status: DC | PRN
  Filled 2020-03-10: qty 10

## 2020-03-10 MED ORDER — HEPARIN SOD (PORK) LOCK FLUSH 100 UNIT/ML IV SOLN
250.0000 [IU] | Freq: Once | INTRAVENOUS | Status: DC | PRN
  Filled 2020-03-10: qty 5

## 2020-03-10 MED ORDER — ALTEPLASE 2 MG IJ SOLR
2.0000 mg | Freq: Once | INTRAMUSCULAR | Status: DC | PRN
  Filled 2020-03-10: qty 2

## 2020-03-10 MED ORDER — SODIUM CHLORIDE 0.9 % IV SOLN
300.0000 mg | Freq: Once | INTRAVENOUS | Status: AC
  Administered 2020-03-10: 300 mg via INTRAVENOUS
  Filled 2020-03-10: qty 10

## 2020-03-10 MED ORDER — HEPARIN SOD (PORK) LOCK FLUSH 100 UNIT/ML IV SOLN
500.0000 [IU] | Freq: Once | INTRAVENOUS | Status: DC | PRN
  Filled 2020-03-10: qty 5

## 2020-03-10 MED ORDER — SODIUM CHLORIDE 0.9% FLUSH
10.0000 mL | Freq: Once | INTRAVENOUS | Status: DC | PRN
  Filled 2020-03-10: qty 10

## 2020-03-10 MED ORDER — SODIUM CHLORIDE 0.9 % IV SOLN
Freq: Once | INTRAVENOUS | Status: AC
  Filled 2020-03-10: qty 250

## 2020-03-10 NOTE — Patient Instructions (Signed)

## 2020-03-10 NOTE — Progress Notes (Signed)
Patient declined to wait 30 minute post observation period. IV catheter removed, vital signs stable, and patient discharged.

## 2020-03-13 ENCOUNTER — Other Ambulatory Visit: Payer: Self-pay

## 2020-03-13 ENCOUNTER — Other Ambulatory Visit: Payer: Medicare HMO | Admitting: Adult Health Nurse Practitioner

## 2020-03-13 ENCOUNTER — Telehealth: Payer: Self-pay | Admitting: Adult Health Nurse Practitioner

## 2020-03-13 NOTE — Telephone Encounter (Signed)
Patient called office to cancel visit today due to not feeling well.  Called patient to see how she was doing and to reschedule.  Left VM with reason for call and call back info Marissa Weaver K. Olena Heckle NP

## 2020-03-16 ENCOUNTER — Other Ambulatory Visit: Payer: Self-pay | Admitting: Family Medicine

## 2020-03-17 NOTE — Telephone Encounter (Signed)
Name of Medication: Restoril Name of Pharmacy: [facility] or Written Date and Quantity: 02/11/20 #30 caps 0 refill  Last Office Visit and Type: CPE on 11/09/19 Next Office Visit and Type: 6 mo f/u on 05/12/20

## 2020-03-18 ENCOUNTER — Ambulatory Visit: Payer: Medicare HMO | Admitting: Family Medicine

## 2020-03-25 ENCOUNTER — Ambulatory Visit: Payer: Medicare HMO

## 2020-03-27 ENCOUNTER — Inpatient Hospital Stay: Payer: Medicare HMO | Attending: Physician Assistant | Admitting: Internal Medicine

## 2020-03-27 ENCOUNTER — Other Ambulatory Visit: Payer: Self-pay

## 2020-03-27 ENCOUNTER — Other Ambulatory Visit: Payer: Self-pay | Admitting: Internal Medicine

## 2020-03-27 ENCOUNTER — Encounter: Payer: Self-pay | Admitting: Internal Medicine

## 2020-03-27 ENCOUNTER — Inpatient Hospital Stay: Payer: Medicare HMO

## 2020-03-27 VITALS — BP 116/62 | HR 70 | Temp 97.9°F | Resp 11 | Ht 65.0 in | Wt 109.6 lb

## 2020-03-27 DIAGNOSIS — D500 Iron deficiency anemia secondary to blood loss (chronic): Secondary | ICD-10-CM | POA: Diagnosis not present

## 2020-03-27 DIAGNOSIS — K5520 Angiodysplasia of colon without hemorrhage: Secondary | ICD-10-CM

## 2020-03-27 DIAGNOSIS — K922 Gastrointestinal hemorrhage, unspecified: Secondary | ICD-10-CM | POA: Insufficient documentation

## 2020-03-27 DIAGNOSIS — D649 Anemia, unspecified: Secondary | ICD-10-CM

## 2020-03-27 LAB — CBC WITH DIFFERENTIAL (CANCER CENTER ONLY)
Abs Immature Granulocytes: 0.04 10*3/uL (ref 0.00–0.07)
Basophils Absolute: 0 10*3/uL (ref 0.0–0.1)
Basophils Relative: 1 %
Eosinophils Absolute: 0.1 10*3/uL (ref 0.0–0.5)
Eosinophils Relative: 2 %
HCT: 34.7 % — ABNORMAL LOW (ref 36.0–46.0)
Hemoglobin: 10.3 g/dL — ABNORMAL LOW (ref 12.0–15.0)
Immature Granulocytes: 1 %
Lymphocytes Relative: 27 %
Lymphs Abs: 1.5 10*3/uL (ref 0.7–4.0)
MCH: 25.2 pg — ABNORMAL LOW (ref 26.0–34.0)
MCHC: 29.7 g/dL — ABNORMAL LOW (ref 30.0–36.0)
MCV: 85 fL (ref 80.0–100.0)
Monocytes Absolute: 0.5 10*3/uL (ref 0.1–1.0)
Monocytes Relative: 8 %
Neutro Abs: 3.6 10*3/uL (ref 1.7–7.7)
Neutrophils Relative %: 61 %
Platelet Count: 146 10*3/uL — ABNORMAL LOW (ref 150–400)
RBC: 4.08 MIL/uL (ref 3.87–5.11)
RDW: 23.8 % — ABNORMAL HIGH (ref 11.5–15.5)
WBC Count: 5.8 10*3/uL (ref 4.0–10.5)
nRBC: 0 % (ref 0.0–0.2)

## 2020-03-27 LAB — IRON AND TIBC
Iron: 38 ug/dL — ABNORMAL LOW (ref 41–142)
Saturation Ratios: 8 % — ABNORMAL LOW (ref 21–57)
TIBC: 500 ug/dL — ABNORMAL HIGH (ref 236–444)
UIBC: 462 ug/dL — ABNORMAL HIGH (ref 120–384)

## 2020-03-27 LAB — FERRITIN: Ferritin: 72 ng/mL (ref 11–307)

## 2020-03-27 NOTE — Patient Instructions (Signed)
Steps to Quit Smoking Smoking tobacco is the leading cause of preventable death. It can affect almost every organ in the body. Smoking puts you and people around you at risk for many serious, long-lasting (chronic) diseases. Quitting smoking can be hard, but it is one of the best things that you can do for your health. It is never too late to quit. How do I get ready to quit? When you decide to quit smoking, make a plan to help you succeed. Before you quit:  Pick a date to quit. Set a date within the next 2 weeks to give you time to prepare.  Write down the reasons why you are quitting. Keep this list in places where you will see it often.  Tell your family, friends, and co-workers that you are quitting. Their support is important.  Talk with your doctor about the choices that may help you quit.  Find out if your health insurance will pay for these treatments.  Know the people, places, things, and activities that make you want to smoke (triggers). Avoid them. What first steps can I take to quit smoking?  Throw away all cigarettes at home, at work, and in your car.  Throw away the things that you use when you smoke, such as ashtrays and lighters.  Clean your car. Make sure to empty the ashtray.  Clean your home, including curtains and carpets. What can I do to help me quit smoking? Talk with your doctor about taking medicines and seeing a counselor at the same time. You are more likely to succeed when you do both.  If you are pregnant or breastfeeding, talk with your doctor about counseling or other ways to quit smoking. Do not take medicine to help you quit smoking unless your doctor tells you to do so. To quit smoking: Quit right away  Quit smoking totally, instead of slowly cutting back on how much you smoke over a period of time.  Go to counseling. You are more likely to quit if you go to counseling sessions regularly. Take medicine You may take medicines to help you quit. Some  medicines need a prescription, and some you can buy over-the-counter. Some medicines may contain a drug called nicotine to replace the nicotine in cigarettes. Medicines may:  Help you to stop having the desire to smoke (cravings).  Help to stop the problems that come when you stop smoking (withdrawal symptoms). Your doctor may ask you to use:  Nicotine patches, gum, or lozenges.  Nicotine inhalers or sprays.  Non-nicotine medicine that is taken by mouth. Find resources Find resources and other ways to help you quit smoking and remain smoke-free after you quit. These resources are most helpful when you use them often. They include:  Online chats with a counselor.  Phone quitlines.  Printed self-help materials.  Support groups or group counseling.  Text messaging programs.  Mobile phone apps. Use apps on your mobile phone or tablet that can help you stick to your quit plan. There are many free apps for mobile phones and tablets as well as websites. Examples include Quit Guide from the CDC and smokefree.gov  What things can I do to make it easier to quit?   Talk to your family and friends. Ask them to support and encourage you.  Call a phone quitline (1-800-QUIT-NOW), reach out to support groups, or work with a counselor.  Ask people who smoke to not smoke around you.  Avoid places that make you want to smoke,   such as: ? Bars. ? Parties. ? Smoke-break areas at work.  Spend time with people who do not smoke.  Lower the stress in your life. Stress can make you want to smoke. Try these things to help your stress: ? Getting regular exercise. ? Doing deep-breathing exercises. ? Doing yoga. ? Meditating. ? Doing a body scan. To do this, close your eyes, focus on one area of your body at a time from head to toe. Notice which parts of your body are tense. Try to relax the muscles in those areas. How will I feel when I quit smoking? Day 1 to 3 weeks Within the first 24 hours,  you may start to have some problems that come from quitting tobacco. These problems are very bad 2-3 days after you quit, but they do not often last for more than 2-3 weeks. You may get these symptoms:  Mood swings.  Feeling restless, nervous, angry, or annoyed.  Trouble concentrating.  Dizziness.  Strong desire for high-sugar foods and nicotine.  Weight gain.  Trouble pooping (constipation).  Feeling like you may vomit (nausea).  Coughing or a sore throat.  Changes in how the medicines that you take for other issues work in your body.  Depression.  Trouble sleeping (insomnia). Week 3 and afterward After the first 2-3 weeks of quitting, you may start to notice more positive results, such as:  Better sense of smell and taste.  Less coughing and sore throat.  Slower heart rate.  Lower blood pressure.  Clearer skin.  Better breathing.  Fewer sick days. Quitting smoking can be hard. Do not give up if you fail the first time. Some people need to try a few times before they succeed. Do your best to stick to your quit plan, and talk with your doctor if you have any questions or concerns. Summary  Smoking tobacco is the leading cause of preventable death. Quitting smoking can be hard, but it is one of the best things that you can do for your health.  When you decide to quit smoking, make a plan to help you succeed.  Quit smoking right away, not slowly over a period of time.  When you start quitting, seek help from your doctor, family, or friends. This information is not intended to replace advice given to you by your health care provider. Make sure you discuss any questions you have with your health care provider. Document Revised: 12/22/2018 Document Reviewed: 06/17/2018 Elsevier Patient Education  2020 [employer]  

## 2020-03-27 NOTE — Progress Notes (Signed)
[facility] Telephone:([phone]   [phone]  OFFICE PROGRESS NOTE  Tower, [name], MD [place] [address]  DIAGNOSIS: Anemia secondary to GI occult blood loss.   PRIOR THERAPY: Oral iron supplements. Discontinued due to intolerance (severe abdominal pain)  CURRENT THERAPY: IV iron infusions with venofer PRN. Last infusion on 12/14/2019  INTERVAL HISTORY: Christine Barton 70 y.o. female returns to the clinic today for follow-up visit.  The patient is feeling much better after receiving the iron infusion.  She denied having any chest pain, shortness of breath, cough or hemoptysis.  She has no nausea, vomiting, diarrhea or constipation.  She denied having any headache or visual changes.  Her fatigue has significantly improved.  She is here today for evaluation with repeat CBC, iron study and ferritin.  MEDICAL HISTORY: Past Medical History:  Diagnosis Date  . Alcohol abuse, unspecified   . Breast cancer ([place]) 1998   Right  . Cataract    left eye  . Cervical spondylosis 2006   MRI  . Degeneration of cervical intervertebral disc 2006   MRI  . Diabetes mellitus without complication ([place])   . Diverticulosis of colon (without mention of hemorrhage)   . Hyperpotassemia   . Microscopic hematuria   . Mononeuritis of unspecified site   . Nonspecific abnormal results of liver function study   . Other abnormal glucose   . Other and unspecified hyperlipidemia    no per pt  . Other chronic nonalcoholic liver disease   . Personal history of chemotherapy   . Personal history of malignant neoplasm of breast   . Personal history of radiation therapy   . Pneumothorax, acute    right, spontaneous  . Tobacco use disorder   . Unspecified vitamin D deficiency     ALLERGIES:  is allergic to oxycodone and glipizide.  MEDICATIONS:  Current Outpatient Medications  Medication Sig Dispense Refill  . albuterol (VENTOLIN HFA) 108 (90 Base)  MCG/ACT inhaler INHALE 2 PUFFS INTO THE LUNGS EVERY 4 (FOUR) HOURS AS NEEDED FOR WHEEZING OR SHORTNESS OF BREATH. 18 g 3  . budesonide-formoterol (SYMBICORT) 160-4.5 MCG/ACT inhaler Inhale 2 puffs into the lungs 2 (two) times daily. 1 each 11  . cholecalciferol (VITAMIN D3) 25 MCG (1000 UNIT) tablet Take 1,000 Units by mouth 2 (two) times daily.    . clorazepate (TRANXENE) 3.75 MG tablet Take 1 tablet (3.75 mg total) by mouth 2 (two) times daily as needed for anxiety. 60 tablet 2  . furosemide (LASIX) 20 MG tablet TAKE 1 TABLET BY MOUTH EVERY DAY 90 tablet 1  . lactulose (CHRONULAC) 10 GM/15ML solution Take 15 mLs (10 g total) by mouth 3 (three) times daily. 236 mL 11  . metFORMIN (GLUCOPHAGE-XR) 500 MG 24 hr tablet TAKE 1 TABLET BY MOUTH EVERY DAY WITH BREAKFAST 90 tablet 0  . Multiple Vitamin (MULTIVITAMIN) tablet Take 1 tablet by mouth daily.    . Multiple Vitamins-Minerals (PRESERVISION AREDS 2) CAPS Take 1 capsule by mouth 2 (two) times daily.    . mupirocin ointment (BACTROBAN) 2 % APPLY 1 APPLICATION TOPICALLY 2 (TWO) TIMES DAILY. TO AFFECTED AREAS 22 g 2  . nadolol (CORGARD) 20 MG tablet TAKE 1 TABLET BY MOUTH EVERY DAY 90 tablet 3  . nystatin (MYCOSTATIN) 100000 UNIT/ML suspension TAKE 5 MLS (500,000 UNITS TOTAL) BY MOUTH 3 (THREE) TIMES DAILY. SWISH AND SWALLOW 120 mL 0  . pantoprazole (PROTONIX) 40 MG tablet TAKE  1 TABLET (40 MG TOTAL) BY MOUTH 2 (TWO) TIMES DAILY BEFORE A MEAL. 180 tablet 1  . Polyethyl Glycol-Propyl Glycol (SYSTANE OP) Place 1 drop into the left eye daily as needed (dryness).    . rosuvastatin (CRESTOR) 5 MG tablet TAKE 1 TABLET (5 MG TOTAL) BY MOUTH DAILY AT 6 PM. 90 tablet 3  . spironolactone (ALDACTONE) 25 MG tablet Take 1 tablet (25 mg total) by mouth daily. 30 tablet 1  . SYSTANE COMPLETE 0.6 % SOLN Apply 1 drop to eye 2 (two) times daily.    . temazepam (RESTORIL) 7.5 MG capsule TAKE 1 CAPSULE (7.5 MG TOTAL) BY MOUTH AT BEDTIME AS NEEDED FOR SLEEP. 30 capsule 3   . traZODone (DESYREL) 50 MG tablet     . XIFAXAN 550 MG TABS tablet TAKE 1 TABLET BY MOUTH 2 TIMES DAILY. 60 tablet 3   No current facility-administered medications for this visit.    SURGICAL HISTORY:  Past Surgical History:  Procedure Laterality Date  . BIOPSY  07/27/2019   Procedure: BIOPSY;  Surgeon: Yetta Flock, MD;  Location: [facility];  Service: Gastroenterology;;  . BREAST BIOPSY  9/03   Right  . BREAST LUMPECTOMY Right 1998  . BREAST LUMPECTOMY  1998  . CHEST TUBE INSERTION  11/02/2014  . COLONOSCOPY    . COLONOSCOPY WITH PROPOFOL N/A 07/27/2019   Procedure: COLONOSCOPY WITH PROPOFOL;  Surgeon: Yetta Flock, MD;  Location: [facility];  Service: Gastroenterology;  Laterality: N/A;  . ESOPHAGOGASTRODUODENOSCOPY (EGD) WITH PROPOFOL N/A 10/30/2018   Procedure: ESOPHAGOGASTRODUODENOSCOPY (EGD) WITH PROPOFOL;  Surgeon: Mauri Pole, MD;  Location: [facility];  Service: Endoscopy;  Laterality: N/A;  . ESOPHAGOGASTRODUODENOSCOPY (EGD) WITH PROPOFOL N/A 03/12/2019   Procedure: ESOPHAGOGASTRODUODENOSCOPY (EGD) WITH PROPOFOL;  Surgeon: Mauri Pole, MD;  [facility];  Service: Endoscopy;  Laterality: N/A;  . ESOPHAGOGASTRODUODENOSCOPY (EGD) WITH PROPOFOL N/A 07/27/2019   Procedure: ESOPHAGOGASTRODUODENOSCOPY (EGD) WITH PROPOFOL;  Surgeon: Yetta Flock, MD;  Location: [facility];  Service: Gastroenterology;  Laterality: N/A;  . EYE SURGERY  02/2017   cataract extraction with lens implant-left  . HOT HEMOSTASIS N/A 07/27/2019   Procedure: HOT HEMOSTASIS (ARGON PLASMA COAGULATION/BICAP);  Surgeon: Yetta Flock, MD;  Location: [facility];  Service: Gastroenterology;  Laterality: N/A;  . MOUTH SURGERY    . POLYPECTOMY  07/27/2019   Procedure: POLYPECTOMY;  Surgeon: Yetta Flock, MD;  Location: [facility];  Service: Gastroenterology;;  . TUBAL LIGATION      REVIEW OF SYSTEMS:  A comprehensive review of systems was  negative.   PHYSICAL EXAMINATION: General appearance: alert, cooperative and no distress Head: Normocephalic, without obvious abnormality, atraumatic Neck: no adenopathy, no JVD, supple, symmetrical, trachea midline and thyroid not enlarged, symmetric, no tenderness/mass/nodules Lymph nodes: Cervical, supraclavicular, and axillary nodes normal. Resp: clear to auscultation bilaterally Back: symmetric, no curvature. ROM normal. No CVA tenderness. Cardio: regular rate and rhythm, S1, S2 normal, no murmur, click, rub or gallop GI: soft, non-tender; bowel sounds normal; no masses,  no organomegaly Extremities: extremities normal, atraumatic, no cyanosis or edema  ECOG PERFORMANCE STATUS: 1 - Symptomatic but completely ambulatory  Blood pressure 116/62, pulse 70, temperature 97.9 F (36.6 C), temperature source Tympanic, resp. rate 11, height 5' 5"  (1.651 m), weight 109 lb 9.6 oz (49.7 kg), SpO2 100 %.  LABORATORY DATA: Lab Results  Component Value Date   WBC 6.8 02/14/2020   HGB 8.7 (L) 02/14/2020   HCT 29.1 (L) 02/14/2020   MCV 81.5  02/14/2020   PLT 136 (L) 02/14/2020      Chemistry      Component Value Date/Time   NA 139 01/14/2020 1144   NA 140 12/11/2019 1617   K 3.9 01/14/2020 1144   CL 101 01/14/2020 1144   CO2 30 01/14/2020 1144   BUN 44 (H) 01/14/2020 1144   BUN 35 (H) 12/11/2019 1617   CREATININE 0.85 01/14/2020 1144      Component Value Date/Time   CALCIUM 10.1 01/14/2020 1144   ALKPHOS 126 (H) 01/14/2020 1144   AST 21 01/14/2020 1144   ALT 11 01/14/2020 1144   BILITOT 0.6 01/14/2020 1144   BILITOT 0.7 12/11/2019 1617       RADIOGRAPHIC STUDIES: No results found.  ASSESSMENT AND PLAN: This is a very pleasant 69 years old white female with iron deficiency anemia secondary to likely chronic blood loss secondary to AV malformation as well as lack of dietary iron supplements.  She has intolerance to the oral iron tablets. She was treated with Venofer infusion  and tolerated it well. The patient had repeat CBC today which showed improvement in her hemoglobin and hematocrit but she still have mild anemia.  Iron study and ferritin are still pending. If the iron studies are normal, I will see her back for follow-up visit in 3 months with repeat CBC, iron study and ferritin.  If she still have iron deficiency, I will arrange for the patient to receive additional iron infusion in the interval. She was advised to call immediately if she has any concerning symptoms in the interval.   The patient voices understanding of current disease status and treatment options and is in agreement with the current care plan. All questions were answered. The patient knows to call the clinic with any problems, questions or concerns. We can certainly see the patient much sooner if necessary.   Disclaimer: This note was dictated with voice recognition software. Similar sounding words can inadvertently be transcribed and may not be corrected upon review.

## 2020-03-28 ENCOUNTER — Telehealth: Payer: Self-pay | Admitting: Internal Medicine

## 2020-03-28 NOTE — Telephone Encounter (Signed)
Scheduled per los. Called and left msg. Mailed printout  °

## 2020-03-28 NOTE — Telephone Encounter (Signed)
Scheduled appt per 12/16 sch msg - pt is aware of appts.

## 2020-04-01 ENCOUNTER — Other Ambulatory Visit: Payer: Self-pay

## 2020-04-01 ENCOUNTER — Inpatient Hospital Stay: Payer: Medicare HMO

## 2020-04-01 VITALS — BP 108/52 | HR 65 | Temp 97.7°F | Resp 18

## 2020-04-01 DIAGNOSIS — K922 Gastrointestinal hemorrhage, unspecified: Secondary | ICD-10-CM | POA: Diagnosis not present

## 2020-04-01 DIAGNOSIS — D500 Iron deficiency anemia secondary to blood loss (chronic): Secondary | ICD-10-CM

## 2020-04-01 DIAGNOSIS — K5520 Angiodysplasia of colon without hemorrhage: Secondary | ICD-10-CM

## 2020-04-01 MED ORDER — SODIUM CHLORIDE 0.9 % IV SOLN
300.0000 mg | Freq: Once | INTRAVENOUS | Status: AC
  Administered 2020-04-01: 300 mg via INTRAVENOUS
  Filled 2020-04-01: qty 15

## 2020-04-01 MED ORDER — SODIUM CHLORIDE 0.9 % IV SOLN
Freq: Once | INTRAVENOUS | Status: AC
Start: 2020-04-01 — End: 2020-04-01
  Filled 2020-04-01: qty 250

## 2020-04-01 NOTE — Progress Notes (Signed)
Patient declined post Venofer observation, verbal education provided.

## 2020-04-01 NOTE — Patient Instructions (Addendum)

## 2020-04-09 ENCOUNTER — Inpatient Hospital Stay: Payer: Medicare HMO

## 2020-04-09 ENCOUNTER — Other Ambulatory Visit: Payer: Self-pay

## 2020-04-09 VITALS — BP 117/70 | HR 70 | Temp 98.2°F | Resp 16

## 2020-04-09 DIAGNOSIS — K922 Gastrointestinal hemorrhage, unspecified: Secondary | ICD-10-CM | POA: Diagnosis not present

## 2020-04-09 DIAGNOSIS — K5520 Angiodysplasia of colon without hemorrhage: Secondary | ICD-10-CM

## 2020-04-09 DIAGNOSIS — D500 Iron deficiency anemia secondary to blood loss (chronic): Secondary | ICD-10-CM

## 2020-04-09 MED ORDER — SODIUM CHLORIDE 0.9 % IV SOLN
300.0000 mg | Freq: Once | INTRAVENOUS | Status: AC
  Administered 2020-04-09: 300 mg via INTRAVENOUS
  Filled 2020-04-09: qty 10

## 2020-04-09 MED ORDER — SODIUM CHLORIDE 0.9 % IV SOLN
Freq: Once | INTRAVENOUS | Status: AC
  Filled 2020-04-09: qty 250

## 2020-04-09 NOTE — Patient Instructions (Signed)

## 2020-04-09 NOTE — Progress Notes (Signed)
Patient declined post Iron infusion observation. Verbal education provided.

## 2020-04-14 ENCOUNTER — Telehealth: Payer: Self-pay | Admitting: Gastroenterology

## 2020-04-14 NOTE — Telephone Encounter (Signed)
Pt is requesting a call back from a nurse to discuss a medical concern she has, pt did not disclose any further information

## 2020-04-15 NOTE — Telephone Encounter (Signed)
Spoke with the patient. She is coming to see you 04/23/20. Asks to have her labs before the visit so the results can be reviewed at the visit.  Also, mentions that she had 3 separate nosebleeds over the weekend. She was able to get it stopped each time and states"it was only a little." She has recently changed to propane heat. We discussed humidifying her air. Please advise on the labs.

## 2020-04-15 NOTE — Telephone Encounter (Signed)
Please have her do CBC, CMP, AFP, PT and INR. Thank you

## 2020-04-16 ENCOUNTER — Other Ambulatory Visit: Payer: Self-pay

## 2020-04-16 ENCOUNTER — Inpatient Hospital Stay: Payer: Medicare HMO | Attending: Physician Assistant

## 2020-04-16 ENCOUNTER — Other Ambulatory Visit (INDEPENDENT_AMBULATORY_CARE_PROVIDER_SITE_OTHER): Payer: Medicare HMO

## 2020-04-16 VITALS — BP 101/58 | HR 67 | Temp 98.5°F | Resp 16

## 2020-04-16 DIAGNOSIS — D509 Iron deficiency anemia, unspecified: Secondary | ICD-10-CM | POA: Diagnosis not present

## 2020-04-16 DIAGNOSIS — K5520 Angiodysplasia of colon without hemorrhage: Secondary | ICD-10-CM

## 2020-04-16 DIAGNOSIS — K7460 Unspecified cirrhosis of liver: Secondary | ICD-10-CM | POA: Diagnosis not present

## 2020-04-16 DIAGNOSIS — D500 Iron deficiency anemia secondary to blood loss (chronic): Secondary | ICD-10-CM

## 2020-04-16 LAB — CBC WITH DIFFERENTIAL/PLATELET
Basophils Absolute: 0 10*3/uL (ref 0.0–0.1)
Basophils Relative: 0.5 % (ref 0.0–3.0)
Eosinophils Absolute: 0.1 10*3/uL (ref 0.0–0.7)
Eosinophils Relative: 1.4 % (ref 0.0–5.0)
HCT: 36.4 % (ref 36.0–46.0)
Hemoglobin: 11.7 g/dL — ABNORMAL LOW (ref 12.0–15.0)
Lymphocytes Relative: 18.5 % (ref 12.0–46.0)
Lymphs Abs: 1.6 10*3/uL (ref 0.7–4.0)
MCHC: 32.1 g/dL (ref 30.0–36.0)
MCV: 82.1 fl (ref 78.0–100.0)
Monocytes Absolute: 0.5 10*3/uL (ref 0.1–1.0)
Monocytes Relative: 6.1 % (ref 3.0–12.0)
Neutro Abs: 6.2 10*3/uL (ref 1.4–7.7)
Neutrophils Relative %: 73.5 % (ref 43.0–77.0)
Platelets: 158 10*3/uL (ref 150.0–400.0)
RBC: 4.43 Mil/uL (ref 3.87–5.11)
RDW: 28.1 % — ABNORMAL HIGH (ref 11.5–15.5)
WBC: 8.4 10*3/uL (ref 4.0–10.5)

## 2020-04-16 LAB — COMPREHENSIVE METABOLIC PANEL
ALT: 12 U/L (ref 0–35)
AST: 20 U/L (ref 0–37)
Albumin: 4.5 g/dL (ref 3.5–5.2)
Alkaline Phosphatase: 82 U/L (ref 39–117)
BUN: 29 mg/dL — ABNORMAL HIGH (ref 6–23)
CO2: 31 mEq/L (ref 19–32)
Calcium: 9.7 mg/dL (ref 8.4–10.5)
Chloride: 100 mEq/L (ref 96–112)
Creatinine, Ser: 0.86 mg/dL (ref 0.40–1.20)
GFR: 68.97 mL/min (ref >60.00)
Glucose, Bld: 120 mg/dL — ABNORMAL HIGH (ref 70–99)
Potassium: 4.3 mEq/L (ref 3.5–5.1)
Sodium: 136 mEq/L (ref 135–145)
Total Bilirubin: 0.7 mg/dL (ref 0.2–1.2)
Total Protein: 7.3 g/dL (ref 6.0–8.3)

## 2020-04-16 LAB — PROTIME-INR
INR: 1.3 ratio — ABNORMAL HIGH (ref 0.8–1.0)
Prothrombin Time: 14.3 s — ABNORMAL HIGH (ref 9.6–13.1)

## 2020-04-16 MED ORDER — SODIUM CHLORIDE 0.9 % IV SOLN
300.0000 mg | Freq: Once | INTRAVENOUS | Status: AC
  Administered 2020-04-16: 300 mg via INTRAVENOUS
  Filled 2020-04-16: qty 5

## 2020-04-16 MED ORDER — SODIUM CHLORIDE 0.9 % IV SOLN
Freq: Once | INTRAVENOUS | Status: AC
  Filled 2020-04-16: qty 250

## 2020-04-16 NOTE — Patient Instructions (Signed)

## 2020-04-16 NOTE — Telephone Encounter (Signed)
Patient advised. Orders in [place].

## 2020-04-16 NOTE — Progress Notes (Signed)
Patient declined staying for her post-observation period following her venofer infusion. Patient's vital signs checked and remained stable at time of discharge. Patient had no complaints and showed no signs of acute distress.

## 2020-04-17 LAB — AFP TUMOR MARKER: AFP-Tumor Marker: 3.7 ng/mL

## 2020-04-21 ENCOUNTER — Other Ambulatory Visit: Payer: Self-pay | Admitting: Family Medicine

## 2020-04-23 ENCOUNTER — Ambulatory Visit: Payer: Medicare HMO | Admitting: Gastroenterology

## 2020-04-23 ENCOUNTER — Encounter: Payer: Self-pay | Admitting: Gastroenterology

## 2020-04-23 VITALS — BP 120/52 | HR 73 | Ht 65.0 in | Wt 109.0 lb

## 2020-04-23 DIAGNOSIS — K7460 Unspecified cirrhosis of liver: Secondary | ICD-10-CM | POA: Diagnosis not present

## 2020-04-23 DIAGNOSIS — R1011 Right upper quadrant pain: Secondary | ICD-10-CM | POA: Diagnosis not present

## 2020-04-23 NOTE — Patient Instructions (Signed)
You have been scheduled for an abdominal ultrasound at [facility] (1st floor of hospital) on 04/28/2020 at 8am. Please arrive 15 minutes prior to your appointment for registration. Make certain not to have anything to eat or aftermidnight prior to your appointment. Should you need to reschedule your appointment, please contact radiology at [phone]. This test typically takes about 30 minutes to perform.  Follow up in 4 months, call back to schedule   Due to recent changes in healthcare laws, you may see the results of your imaging and laboratory studies on MyChart before your provider has had a chance to review them.  We understand that in some cases there may be results that are confusing or concerning to you. Not all laboratory results come back in the same time frame and the provider may be waiting for multiple results in order to interpret others.    I appreciate the  opportunity to care for you  Thank You   Harl Bowie , MD e give [place] 48 hours in order for your provider to thoroughly review all the results before contacting the office for clarification of your results.

## 2020-04-23 NOTE — Progress Notes (Signed)
Christine Barton    [id number]    [birth year]  Primary Care Physician:Tower, [name], MD  Referring Physician: Tower, [name], MD [name],  Christine Barton   Chief complaint: Cirrhosis  HPI:  70 year old very pleasant female with history of breast cancer, type 2 diabetes, COPD, chronic anemia secondary to occult GI blood loss and decompensated cirrhosis  Overall doing well.  Continues to have intermittent right upper quadrant discomfort.  No significant abnormality noted on recent imaging or endoscopic evaluation  Denies any nausea, vomiting, melena or bright red blood per rectum Overall she feels well, she is no longer tired. Denies any confusion, lower leg swelling or abdominal distention  Is taking Xifaxan and lactulose, has 2-3 soft bowel movements daily  She is receiving IV iron infusion, is being managed by hematology  MRI liver 01-08-2020: Stable cirrhosis with no evidence of hepatocellular carcinoma. Choledocholithiasis with stable CBD. Increased iron deposition in the setting of IV iron therapy  EGD July 27, 2019 by Dr. Havery Moros: Small esophageal varices and portal hypertensive gastropathy otherwise normal exam  Colonoscopy July 27, 2019:8small AVMs scattered in the colon ablated with APC and 3 small polyps removed, diverticulosis and internal hemorrhoids  EGD February 23, 2019:Small grade 1 esophageal varices, portal hypertensive gastropathy  Colonoscopy November 30, 2016: Diverticulosis, sessile polyps X4 tubular adenomas and internal hemorrhoids. Recall colonoscopy in 3 years   Outpatient Encounter Medications as of 04/23/2020  Medication Sig  . albuterol (VENTOLIN HFA) 108 (90 Base) MCG/ACT inhaler INHALE 2 PUFFS INTO THE LUNGS EVERY 4 HOURS AS NEEDED FOR WHEEZE OR FOR SHORTNESS OF BREATH  . budesonide-formoterol (SYMBICORT) 160-4.5 MCG/ACT inhaler Inhale 2 puffs into the lungs 2 (two) times daily.  . cholecalciferol  (VITAMIN D3) 25 MCG (1000 UNIT) tablet Take 1,000 Units by mouth 2 (two) times daily.  . clorazepate (TRANXENE) 3.75 MG tablet Take 1 tablet (3.75 mg total) by mouth 2 (two) times daily as needed for anxiety.  . furosemide (LASIX) 20 MG tablet TAKE 1 TABLET BY MOUTH EVERY DAY  . lactulose (CHRONULAC) 10 GM/15ML solution Take 15 mLs (10 g total) by mouth 3 (three) times daily.  . metFORMIN (GLUCOPHAGE-XR) 500 MG 24 hr tablet TAKE 1 TABLET BY MOUTH EVERY DAY WITH BREAKFAST  . Multiple Vitamin (MULTIVITAMIN) tablet Take 1 tablet by mouth daily.  . Multiple Vitamins-Minerals (PRESERVISION AREDS 2) CAPS Take 1 capsule by mouth 2 (two) times daily.  . mupirocin ointment (BACTROBAN) 2 % APPLY 1 APPLICATION TOPICALLY 2 (TWO) TIMES DAILY. TO AFFECTED AREAS  . nadolol (CORGARD) 20 MG tablet TAKE 1 TABLET BY MOUTH EVERY DAY  . nystatin (MYCOSTATIN) 100000 UNIT/ML suspension TAKE 5 MLS (500,000 UNITS TOTAL) BY MOUTH 3 (THREE) TIMES DAILY. SWISH AND SWALLOW  . pantoprazole (PROTONIX) 40 MG tablet TAKE 1 TABLET (40 MG TOTAL) BY MOUTH 2 (TWO) TIMES DAILY BEFORE A MEAL.  [name] Glycol-Propyl Glycol (SYSTANE OP) Place 1 drop into the left eye daily as needed (dryness).  Marland Kitchen spironolactone (ALDACTONE) 25 MG tablet Take 1 tablet (25 mg total) by mouth daily.  [name] 0.6 % SOLN Apply 1 drop to eye 2 (two) times daily.  . temazepam (RESTORIL) 7.5 MG capsule TAKE 1 CAPSULE (7.5 MG TOTAL) BY MOUTH AT BEDTIME AS NEEDED FOR SLEEP.  Marland Kitchen traZODone (DESYREL) 50 MG tablet   . XIFAXAN 550 MG TABS tablet TAKE 1 TABLET BY MOUTH 2 TIMES DAILY.  Marland Kitchen  rosuvastatin (CRESTOR) 5 MG tablet TAKE 1 TABLET (5 MG TOTAL) BY MOUTH DAILY AT 6 PM.   No facility-administered encounter medications on file as of 04/23/2020.    Allergies as of 04/23/2020 - Review Complete 04/23/2020  Allergen Reaction Noted  . Oxycodone Nausea And Vomiting 11/02/2014  . Glipizide Other (See Comments) 09/12/2013    Past Medical History:   Diagnosis Date  . Alcohol abuse, unspecified   . Breast cancer ([place]) 1998   Right  . Cataract    left eye  . Cervical spondylosis 2006   MRI  . Degeneration of cervical intervertebral disc 2006   MRI  . Diabetes mellitus without complication ([place])   . Diverticulosis of colon (without mention of hemorrhage)   . Hyperpotassemia   . Microscopic hematuria   . Mononeuritis of unspecified site   . Nonspecific abnormal results of liver function study   . Other abnormal glucose   . Other and unspecified hyperlipidemia    no per pt  . Other chronic nonalcoholic liver disease   . Personal history of chemotherapy   . Personal history of malignant neoplasm of breast   . Personal history of radiation therapy   . Pneumothorax, acute    right, spontaneous  . Tobacco use disorder   . Unspecified vitamin D deficiency     Past Surgical History:  Procedure Laterality Date  . BIOPSY  07/27/2019   Procedure: BIOPSY;  Surgeon: Yetta Flock, MD;  Location: [facility];  Service: Gastroenterology;;  . BREAST BIOPSY  9/03   Right  . BREAST LUMPECTOMY Right 1998  . CHEST TUBE INSERTION  11/02/2014  . COLONOSCOPY    . COLONOSCOPY WITH PROPOFOL N/A 07/27/2019   Procedure: COLONOSCOPY WITH PROPOFOL;  Surgeon: Yetta Flock, MD;  Location: [facility];  Service: Gastroenterology;  Laterality: N/A;  . ESOPHAGOGASTRODUODENOSCOPY (EGD) WITH PROPOFOL N/A 10/30/2018   Procedure: ESOPHAGOGASTRODUODENOSCOPY (EGD) WITH PROPOFOL;  Surgeon: Mauri Pole, MD;  Location: [facility];  Service: Endoscopy;  Laterality: N/A;  . ESOPHAGOGASTRODUODENOSCOPY (EGD) WITH PROPOFOL N/A 03/12/2019   Procedure: ESOPHAGOGASTRODUODENOSCOPY (EGD) WITH PROPOFOL;  Surgeon: Mauri Pole, MD;  [facility]: [facility];  Service: Endoscopy;  Laterality: N/A;  . ESOPHAGOGASTRODUODENOSCOPY (EGD) WITH PROPOFOL N/A 07/27/2019   Procedure: ESOPHAGOGASTRODUODENOSCOPY (EGD) WITH PROPOFOL;  Surgeon: Yetta Flock, MD;  Location: [facility];  Service: Gastroenterology;  Laterality: N/A;  . EYE SURGERY  02/2017   cataract extraction with lens implant-left  . HOT HEMOSTASIS N/A 07/27/2019   Procedure: HOT HEMOSTASIS (ARGON PLASMA COAGULATION/BICAP);  Surgeon: Yetta Flock, MD;  Location: [facility];  Service: Gastroenterology;  Laterality: N/A;  . MOUTH SURGERY    . POLYPECTOMY  07/27/2019   Procedure: POLYPECTOMY;  Surgeon: Yetta Flock, MD;  Location: [facility];  Service: Gastroenterology;;  . TUBAL LIGATION      Family History  Problem Relation Age of Onset  . Heart failure Father   . Heart attack Father   . Colon cancer Maternal Uncle   . Stroke Mother   . Esophageal cancer Neg Hx   . Rectal cancer Neg Hx   . Stomach cancer Neg Hx   . Pancreatic cancer Neg Hx     Social History   Socioeconomic History  . Marital status: Single    Spouse name: Not on file  . Number of children: 1  . Years of education: Not on file  . Highest education level: Not on file  Occupational History  . Occupation: retired  Employer: [employer]  Tobacco Use  . Smoking status: Current Every Day Smoker    Packs/day: 1.00    Years: 53.00    Pack years: 53.00    Types: Cigarettes  . Smokeless tobacco: Never Used  . Tobacco comment: tobacco info given 04/27/2018  Vaping Use  . Vaping Use: Never used  Substance and Sexual Activity  . Alcohol use: Not Currently  . Drug use: No  . Sexual activity: Not Currently  Other Topics Concern  . Not on file  Social History Narrative   Divorced      1 child      Works at [employer] Strain: [place]   . Difficulty of Paying Living Expenses: Not hard at all  Food Insecurity: No Food Insecurity  . Worried About Charity fundraiser in the Last Year: Never true  . Ran Out of Food in the Last Year: Never true  Transportation Needs: No Transportation Needs  .  Lack of Transportation (Medical): No  . Lack of Transportation (Non-Medical): No  Physical Activity: Insufficiently Active  . Days of Exercise per Week: 7 days  . Minutes of Exercise per Session: 10 min  Stress: Stress Concern Present  . Feeling of Stress : To some extent  Social Connections: Not on file  Intimate Partner Violence: Not At Risk  . Fear of Current or Ex-Partner: No  . Emotionally Abused: No  . Physically Abused: No  . Sexually Abused: No      Review of systems: All other review of systems negative except as mentioned in the HPI.   Physical Exam: Vitals:   04/23/20 0942  BP: (!) 120/52  Pulse: 73  SpO2: 99%   Body mass index is 18.14 kg/m. Gen:      No acute distress HEENT:  sclera anicteric Abd:      soft, non-tender; no palpable masses, no distension Ext:    No edema Neuro: alert and oriented x 3 Psych: normal mood and affect  Data Reviewed:  Reviewed labs, radiology imaging, old records and pertinent past GI work up   Assessment and Plan/Recommendations:  70 year old very pleasant female with history of NASH and EtOH cirrhosis decompensated by ascites and hepatic encephalopathy  Low meld, labs are stable  Iron deficiency anemia: Improving with IV iron infusion Chronic GI blood loss secondary to small bowel AVMs Continue to monitor CBC, iron panel and IV iron infusion as needed.  She is being managed by hematology  Hepatic encephalopathy: Stable Continue Xifaxan and lactulose  No evidence of volume overload or ascites on exam, continue low-dose diuretics  HCC screening: Due for right upper quadrant ultrasound, will schedule it Right upper quadrant discomfort, unclear etiology.  No significant normality on previous imaging or endoscopic evaluation  Return in 3 months or sooner if needed   The patient was provided an opportunity to ask questions and all were answered. The patient agreed with the plan and demonstrated an understanding of the  instructions.  Damaris Hippo , MD    CC: Tower, [name], MD

## 2020-04-28 ENCOUNTER — Ambulatory Visit (HOSPITAL_COMMUNITY): Payer: Medicare HMO

## 2020-04-29 ENCOUNTER — Encounter: Payer: Self-pay | Admitting: Gastroenterology

## 2020-04-30 ENCOUNTER — Ambulatory Visit (HOSPITAL_COMMUNITY)
Admission: RE | Admit: 2020-04-30 | Discharge: 2020-04-30 | Disposition: A | Discharge status: Home / Self Care | Payer: Medicare HMO | Source: Ambulatory Visit | Attending: Gastroenterology | Admitting: Gastroenterology

## 2020-04-30 ENCOUNTER — Telehealth: Payer: Self-pay | Admitting: Gastroenterology

## 2020-04-30 ENCOUNTER — Other Ambulatory Visit: Payer: Self-pay

## 2020-04-30 DIAGNOSIS — K7460 Unspecified cirrhosis of liver: Secondary | ICD-10-CM | POA: Insufficient documentation

## 2020-04-30 DIAGNOSIS — R1011 Right upper quadrant pain: Secondary | ICD-10-CM | POA: Diagnosis not present

## 2020-04-30 DIAGNOSIS — K8020 Calculus of gallbladder without cholecystitis without obstruction: Secondary | ICD-10-CM | POA: Diagnosis not present

## 2020-04-30 NOTE — Telephone Encounter (Signed)
Christine Barton with [facility] radi[facility] called about [place] report on pt. Pls call her at  [phone].

## 2020-04-30 NOTE — Telephone Encounter (Signed)
Dr [name] aware of the u/s report and findings.

## 2020-05-04 ENCOUNTER — Telehealth: Payer: Self-pay | Admitting: Family Medicine

## 2020-05-04 DIAGNOSIS — E118 Type 2 diabetes mellitus with unspecified complications: Secondary | ICD-10-CM

## 2020-05-04 DIAGNOSIS — E1169 Type 2 diabetes mellitus with other specified complication: Secondary | ICD-10-CM

## 2020-05-04 NOTE — Telephone Encounter (Signed)
-----   Message from Cloyd Stagers, RT sent at 04/21/2020  1:54 PM EST ----- Regarding: Lab Orders for Monday 1.24.2022 Please place lab orders for Monday 1.24.2022, office visit for 6 month f/u on Monday 1.31.2022 Thank you, Dyke Maes RT(R)

## 2020-05-05 ENCOUNTER — Other Ambulatory Visit: Payer: Medicare HMO

## 2020-05-06 ENCOUNTER — Other Ambulatory Visit: Payer: Self-pay

## 2020-05-06 ENCOUNTER — Other Ambulatory Visit (INDEPENDENT_AMBULATORY_CARE_PROVIDER_SITE_OTHER): Payer: Medicare HMO

## 2020-05-06 DIAGNOSIS — E118 Type 2 diabetes mellitus with unspecified complications: Secondary | ICD-10-CM

## 2020-05-06 DIAGNOSIS — E785 Hyperlipidemia, unspecified: Secondary | ICD-10-CM

## 2020-05-06 DIAGNOSIS — E1169 Type 2 diabetes mellitus with other specified complication: Secondary | ICD-10-CM

## 2020-05-06 LAB — LIPID PANEL
Cholesterol: 127 mg/dL (ref 0–200)
HDL: 43 mg/dL (ref >39.00)
LDL Cholesterol: 57 mg/dL (ref 0–99)
NonHDL: 84.18
Total CHOL/HDL Ratio: 3
Triglycerides: 135 mg/dL (ref 0.0–149.0)
VLDL: 27 mg/dL (ref 0.0–40.0)

## 2020-05-06 LAB — HEMOGLOBIN A1C: Hgb A1c MFr Bld: 5.2 % (ref 4.6–6.5)

## 2020-05-07 DIAGNOSIS — K8020 Calculus of gallbladder without cholecystitis without obstruction: Secondary | ICD-10-CM | POA: Diagnosis not present

## 2020-05-12 ENCOUNTER — Encounter: Payer: Self-pay | Admitting: Family Medicine

## 2020-05-12 ENCOUNTER — Other Ambulatory Visit: Payer: Self-pay

## 2020-05-12 ENCOUNTER — Ambulatory Visit (INDEPENDENT_AMBULATORY_CARE_PROVIDER_SITE_OTHER): Payer: Medicare HMO | Admitting: Family Medicine

## 2020-05-12 VITALS — BP 124/62 | HR 78 | Temp 97.0°F | Resp 16 | Ht 65.0 in | Wt 113.1 lb

## 2020-05-12 DIAGNOSIS — K7030 Alcoholic cirrhosis of liver without ascites: Secondary | ICD-10-CM | POA: Diagnosis not present

## 2020-05-12 DIAGNOSIS — G5793 Unspecified mononeuropathy of bilateral lower limbs: Secondary | ICD-10-CM

## 2020-05-12 DIAGNOSIS — D500 Iron deficiency anemia secondary to blood loss (chronic): Secondary | ICD-10-CM

## 2020-05-12 DIAGNOSIS — E1169 Type 2 diabetes mellitus with other specified complication: Secondary | ICD-10-CM | POA: Diagnosis not present

## 2020-05-12 DIAGNOSIS — F17200 Nicotine dependence, unspecified, uncomplicated: Secondary | ICD-10-CM | POA: Diagnosis not present

## 2020-05-12 DIAGNOSIS — K8020 Calculus of gallbladder without cholecystitis without obstruction: Secondary | ICD-10-CM

## 2020-05-12 DIAGNOSIS — E118 Type 2 diabetes mellitus with unspecified complications: Secondary | ICD-10-CM

## 2020-05-12 DIAGNOSIS — E785 Hyperlipidemia, unspecified: Secondary | ICD-10-CM

## 2020-05-12 DIAGNOSIS — I700 Atherosclerosis of aorta: Secondary | ICD-10-CM | POA: Diagnosis not present

## 2020-05-12 DIAGNOSIS — I8510 Secondary esophageal varices without bleeding: Secondary | ICD-10-CM | POA: Diagnosis not present

## 2020-05-12 NOTE — Assessment & Plan Note (Signed)
She is thinking harder about quitting but has not initiated it  Known copd

## 2020-05-12 NOTE — Patient Instructions (Addendum)
Take care of yourself  Work hard on quitting smoking   Blood sugar and cholesterol are well controlled   Watch out for more abdominal pain and let the surgeon and GI doctor know

## 2020-05-12 NOTE — Assessment & Plan Note (Addendum)
Nl bp and cholesterol on crestor  Needs to quit smoking  No clinical changes

## 2020-05-12 NOTE — Assessment & Plan Note (Signed)
Per pt-stable and she has felt better recently than in the past several years Better appetite  No etoh intake

## 2020-05-12 NOTE — Assessment & Plan Note (Signed)
Stable  On nadolol  Under care of GI

## 2020-05-12 NOTE — Assessment & Plan Note (Signed)
Pt has intermittent RUQ pain after eating  Recent US showed gallstones (had murphy sign at that time) I expect gallbladder related Sent for last surgical note from Dr Zenia Resides at [facility]  Not a good surgical candidate with copd and cirrhosis

## 2020-05-12 NOTE — Assessment & Plan Note (Signed)
Disc goals for lipids and reasons to control them Rev last labs with pt Rev low sat fat diet in detail Good control with very low dose rosuvastatin 5 mg daily  LDL of 57

## 2020-05-12 NOTE — Assessment & Plan Note (Signed)
Excellent control with metformin and diet  Lab Results  Component Value Date   HGBA1C 5.2 05/06/2020   Takes statin  Nl microalbumin

## 2020-05-12 NOTE — Assessment & Plan Note (Signed)
From chronic GI loss Getting IV iron infusions and getting better

## 2020-05-12 NOTE — Assessment & Plan Note (Signed)
Nl monofilament test today

## 2020-05-12 NOTE — Progress Notes (Signed)
Subjective:    Patient ID: Christine Barton, female    DOB: 13-Sep-1950, 70 y.o.   MRN: [id number]  This visit occurred during the SARS-CoV-2 public health emergency.  Safety protocols were in place, including screening questions prior to the visit, additional usage of staff PPE, and extensive cleaning of exam room while observing appropriate contact time as indicated for disinfecting solutions.    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  Taking spironolactone and lasix  protonix Lactulose Nadolol  xifaxan  IV iron managed by hematology   Recent US also showed gallstones with pos murphy sign during procedure  Having pain there  No nausea  Pain is there-not bad , it comes and goes RUQ   Saw surgeon last week  Does not suggest having it removed  Dr Zenia Resides - [facility] Also had her MRI liver and also lung scan   US Abdomen Limited RUQ (LIVER/GB)  Result Date: 04/30/2020 CLINICAL DATA:  Cirrhosis EXAM: ULTRASOUND ABDOMEN LIMITED RIGHT UPPER QUADRANT COMPARISON:  05/29/2019 FINDINGS: Gallbladder: Nonshadowing stones or conglomerate sludge ball within the gallbladder lumen measuring up to 2.7 cm in diameter. Gallbladder wall thickness of 2.7 mm. No pericholecystic fluid. Sonographer reports a positive Murphy sign on exam. Common bile duct: Diameter: 9.5 mm. Liver: No focal lesion identified. Coarsened hepatic echotexture with nodular surface contour compatible with known cirrhosis. Portal vein is patent on color Doppler imaging with normal direction of blood flow towards the liver. Other: None. IMPRESSION: 1. Cirrhotic liver morphology.  No focal liver lesion is identified.  2. Cholelithiasis with a positive sonographic Murphy sign. Findings raise suspicion for acute cholecystitis. These results will be called to the ordering clinician or representative by the Radiologist Assistant, and communication documented in the PACS or [employer]. Electronically Signed   By: Davina Poke D.O.   On: 04/30/2020 15:09     BP Readings from Last 3 Encounters:  05/12/20 124/62  04/23/20 (!) 120/52  04/16/20 (!) 101/58   Pulse Readings from Last 3 Encounters:  05/12/20 78  04/23/20 73  04/16/20 67   Smoking status  Feels about ready to quit     DM2 Lab Results  Component Value Date   HGBA1C 5.2 05/06/2020  taking metformin  This is down from 6.2 Has been eating more regularly /good appetite  Does watch her diet   Hyperlipidemia  Lab Results  Component Value Date   CHOL 127 05/06/2020   CHOL 90 11/02/2019   CHOL 99 07/31/2019   Lab Results  Component Value Date   HDL 43.00 05/06/2020   HDL 37.70 (L) 11/02/2019   HDL 37.60 (L) 07/31/2019   Lab Results  Component Value Date   LDLCALC 57 05/06/2020   LDLCALC 34 11/02/2019   LDLCALC 41 07/31/2019   Lab Results  Component Value Date   TRIG 135.0 05/06/2020   TRIG 92.0 11/02/2019   TRIG 98.0 07/31/2019   Lab Results  Component Value Date   CHOLHDL 3 05/06/2020   CHOLHDL 2 11/02/2019   CHOLHDL 3 07/31/2019   Lab Results  Component  Value Date   LDLDIRECT 83.0 10/31/2017   LDLDIRECT 145.1 10/10/2012   LDLDIRECT 151.4 07/21/2011  low dose rosuvastatin 5 mg daily   Lab Results  Component Value Date   ALT 12 04/16/2020   AST 20 04/16/2020   ALKPHOS 82 04/16/2020   BILITOT 0.7 04/16/2020    Lab Results  Component Value Date   WBC 8.4 04/16/2020   HGB 11.7 (L) 04/16/2020   HCT 36.4 04/16/2020   MCV 82.1 04/16/2020   PLT 158.0 04/16/2020  gets iron infusions   Patient Active Problem List   Diagnosis Date Noted  . Abdominal pain 08/08/2019  . Protein-calorie malnutrition,  severe 07/27/2019  . AVM (arteriovenous malformation) of colon   . Benign neoplasm of colon   . NASH (nonalcoholic steatohepatitis)   . Malnutrition ([place]) 07/25/2019  . Symptomatic anemia 07/25/2019  . Tobacco abuse 07/25/2019  . Mass of soft tissue of face 03/06/2019  . HSV infection 02/23/2019  . Aortic atherosclerosis ([place]) 01/22/2019  . Coronary atherosclerosis 01/22/2019  . Encounter for screening for lung cancer 01/04/2019  . Low back pain 12/07/2018  . Venous insufficiency 11/15/2018  . Melena   . Esophageal varices without bleeding ([place]) 10/27/2018  . Anemia 10/23/2018  . Fatigue 10/23/2018  . Poor balance 05/30/2018  . Falls 05/30/2018  . Generalized weakness 05/30/2018  . Cirrhosis of liver ([place]) 04/24/2018  . Ascites 04/24/2018  . Gallstones 04/24/2018  . Heme positive stool 04/24/2018  . Screening mammogram, encounter for 11/07/2017  . Smoker 11/07/2017  . Screening examination for STD (sexually transmitted disease) 11/11/2016  . Welcome to Medicare preventive visit 10/26/2016  . Estrogen deficiency 10/26/2016  . Colon cancer screening 11/01/2014  . Elevated liver enzymes 11/01/2014  . Encounter for routine gynecological examination 09/12/2013  . Rapid heart beat 10/10/2012  . Heartburn 08/20/2011  . Routine general medical examination at a health care facility 07/21/2011  . Vitamin D deficiency 08/04/2009  . POSTMENOPAUSAL STATUS 08/04/2009  . Controlled diabetes mellitus type 2 with complications ([place]) 56/25/6389  . Hyperlipidemia associated with type 2 diabetes mellitus ([place]) 06/14/2008  . [place] DISEASE, CERVICAL 03/30/2007  . History of alcohol abuse 12/06/2006  . Neuropathy of both feet 12/06/2006  . DIVERTICULOSIS, COLON 12/06/2006  . Fatty liver 12/06/2006  . BREAST CANCER, HX OF 12/06/2006   Past Medical History:  Diagnosis Date  . Alcohol abuse, unspecified   . Breast cancer ([place]) 1998   Right  . Cataract    left eye  . Cervical spondylosis  2006   MRI  . Degeneration of cervical intervertebral disc 2006   MRI  . Diabetes mellitus without complication ([place])   . Diverticulosis of colon (without mention of hemorrhage)   . Hyperpotassemia   . Microscopic hematuria   . Mononeuritis of unspecified site   . Nonspecific abnormal results of liver function study   . Other abnormal glucose   . Other and unspecified hyperlipidemia    no per pt  . Other chronic nonalcoholic liver disease   . Personal history of chemotherapy   . Personal history of malignant neoplasm of breast   . Personal history of radiation therapy   . Pneumothorax, acute    right, spontaneous  . Tobacco use disorder   . Unspecified vitamin D deficiency    Past Surgical History:  Procedure Laterality Date  . BIOPSY  07/27/2019   Procedure: BIOPSY;  Surgeon: Yetta Flock, MD;  Location: [facility];  Service: Gastroenterology;;  . BREAST BIOPSY  9/03   Right  . BREAST LUMPECTOMY Right 1998  . CHEST TUBE INSERTION  11/02/2014  . COLONOSCOPY    . COLONOSCOPY WITH PROPOFOL N/A 07/27/2019   Procedure: COLONOSCOPY WITH PROPOFOL;  Surgeon: Yetta Flock, MD;  Location: [facility];  Service: Gastroenterology;  Laterality: N/A;  . ESOPHAGOGASTRODUODENOSCOPY (EGD) WITH PROPOFOL N/A 10/30/2018   Procedure: ESOPHAGOGASTRODUODENOSCOPY (EGD) WITH PROPOFOL;  Surgeon: Mauri Pole, MD;  Location: [facility];  Service: Endoscopy;  Laterality: N/A;  . ESOPHAGOGASTRODUODENOSCOPY (EGD) WITH PROPOFOL N/A 03/12/2019   Procedure: ESOPHAGOGASTRODUODENOSCOPY (EGD) WITH PROPOFOL;  Surgeon: Mauri Pole, MD;  Location: [facility];  Service: Endoscopy;  Laterality: N/A;  . ESOPHAGOGASTRODUODENOSCOPY (EGD) WITH PROPOFOL N/A 07/27/2019   Procedure: ESOPHAGOGASTRODUODENOSCOPY (EGD) WITH PROPOFOL;  Surgeon: Yetta Flock, MD;  Location: [facility];  Service: Gastroenterology;  Laterality: N/A;  . EYE SURGERY  02/2017   cataract extraction with lens  implant-left  . HOT HEMOSTASIS N/A 07/27/2019   Procedure: HOT HEMOSTASIS (ARGON PLASMA COAGULATION/BICAP);  Surgeon: Yetta Flock, MD;  Location: [facility];  Service: Gastroenterology;  Laterality: N/A;  . MOUTH SURGERY    . POLYPECTOMY  07/27/2019   Procedure: POLYPECTOMY;  Surgeon: Yetta Flock, MD;  Location: [facility];  Service: Gastroenterology;;  . TUBAL LIGATION     Social History   Tobacco Use  . Smoking status: Current Every Day Smoker    Packs/day: 1.00    Years: 53.00    Pack years: 53.00    Types: Cigarettes  . Smokeless tobacco: Never Used  . Tobacco comment: tobacco info given 04/27/2018  Vaping Use  . Vaping Use: Never used  Substance Use Topics  . Alcohol use: Not Currently  . Drug use: No   Family History  Problem Relation Age of Onset  . Heart failure Father   . Heart attack Father   . Colon cancer Maternal Uncle   . Stroke Mother   . Esophageal cancer Neg Hx   . Rectal cancer Neg Hx   . Stomach cancer Neg Hx   . Pancreatic cancer Neg Hx    Allergies  Allergen Reactions  . Oxycodone Nausea And Vomiting  . Glipizide Other (See Comments)    Stomach pain   Current Outpatient Medications on File Prior to Visit  Medication Sig Dispense Refill  . albuterol (VENTOLIN HFA) 108 (90 Base) MCG/ACT inhaler INHALE 2 PUFFS INTO THE LUNGS EVERY 4 HOURS AS NEEDED FOR WHEEZE OR FOR SHORTNESS OF BREATH 18 each 3  . budesonide-formoterol (SYMBICORT) 160-4.5 MCG/ACT inhaler Inhale 2 puffs into the lungs 2 (two) times daily. 1 each 11  . cholecalciferol (VITAMIN D3) 25 MCG (1000 UNIT) tablet Take 1,000 Units by mouth 2 (two) times daily.    . clorazepate (TRANXENE) 3.75 MG tablet Take 1 tablet (3.75 mg total) by mouth 2 (two) times daily as needed for anxiety. 60 tablet 2  . furosemide (LASIX) 20 MG tablet TAKE 1 TABLET BY MOUTH EVERY DAY 90 tablet 1  . lactulose (CHRONULAC) 10 GM/15ML solution Take 15 mLs (10 g total) by mouth 3 (three) times daily.  236 mL 11  . metFORMIN (GLUCOPHAGE-XR) 500 MG 24 hr tablet TAKE 1 TABLET BY MOUTH EVERY DAY WITH BREAKFAST 90 tablet 0  . Multiple Vitamin (MULTIVITAMIN) tablet Take 1 tablet by mouth daily.    . Multiple Vitamins-Minerals (PRESERVISION AREDS 2) CAPS Take 1 capsule by mouth 2 (two) times daily.    . mupirocin ointment (BACTROBAN) 2 % APPLY 1 APPLICATION TOPICALLY  2 (TWO) TIMES DAILY. TO AFFECTED AREAS 22 g 2  . nadolol (CORGARD) 20 MG tablet TAKE 1 TABLET BY MOUTH EVERY DAY 90 tablet 3  . nystatin (MYCOSTATIN) 100000 UNIT/ML suspension TAKE 5 MLS (500,000 UNITS TOTAL) BY MOUTH 3 (THREE) TIMES DAILY. SWISH AND SWALLOW 120 mL 0  . pantoprazole (PROTONIX) 40 MG tablet TAKE 1 TABLET (40 MG TOTAL) BY MOUTH 2 (TWO) TIMES DAILY BEFORE A MEAL. 180 tablet 1  . Polyethyl Glycol-Propyl Glycol (SYSTANE OP) Place 1 drop into the left eye daily as needed (dryness).    Marland Kitchen spironolactone (ALDACTONE) 25 MG tablet Take 1 tablet (25 mg total) by mouth daily. 30 tablet 1  . SYSTANE COMPLETE 0.6 % SOLN Apply 1 drop to eye 2 (two) times daily.    . temazepam (RESTORIL) 7.5 MG capsule TAKE 1 CAPSULE (7.5 MG TOTAL) BY MOUTH AT BEDTIME AS NEEDED FOR SLEEP. 30 capsule 3  . traZODone (DESYREL) 50 MG tablet     . XIFAXAN 550 MG TABS tablet TAKE 1 TABLET BY MOUTH 2 TIMES DAILY. 60 tablet 3  . rosuvastatin (CRESTOR) 5 MG tablet TAKE 1 TABLET (5 MG TOTAL) BY MOUTH DAILY AT 6 PM. 90 tablet 3   No current facility-administered medications on file prior to visit.    Review of Systems  Constitutional: Negative for activity change, appetite change, fatigue, fever and unexpected weight change.  HENT: Negative for congestion, ear pain, rhinorrhea, sinus pressure and sore throat.   Eyes: Negative for pain, redness and visual disturbance.  Respiratory: Negative for cough, shortness of breath and wheezing.   Cardiovascular: Negative for chest pain and palpitations.  Gastrointestinal: Positive for abdominal pain. Negative for  abdominal distention, blood in stool, constipation, diarrhea, nausea and vomiting.  Endocrine: Negative for polydipsia and polyuria.  Genitourinary: Negative for dysuria, frequency and urgency.  Musculoskeletal: Negative for arthralgias, back pain and myalgias.  Skin: Negative for pallor and rash.  Allergic/Immunologic: Negative for environmental allergies.  Neurological: Negative for dizziness, syncope and headaches.  Hematological: Negative for adenopathy. Does not bruise/bleed easily.  Psychiatric/Behavioral: Negative for decreased concentration and dysphoric mood. The patient is not nervous/anxious.        Objective:   Physical Exam Constitutional:      General: She is not in acute distress.    Appearance: Normal appearance. She is well-developed, normal weight and well-nourished. She is not ill-appearing.     Comments: Under weight baseline   HENT:     Head: Normocephalic and atraumatic.     Mouth/Throat:     Mouth: Oropharynx is clear and moist. Mucous membranes are moist.  Eyes:     General: No scleral icterus.    Extraocular Movements: EOM normal.     Conjunctiva/sclera: Conjunctivae normal.     Pupils: Pupils are equal, round, and reactive to light.  Neck:     Thyroid: No thyromegaly.     Vascular: No carotid bruit or JVD.  Cardiovascular:     Rate and Rhythm: Normal rate and regular rhythm.     Pulses: Intact distal pulses.     Heart sounds: Normal heart sounds. No gallop.   Pulmonary:     Effort: Pulmonary effort is normal. No respiratory distress.     Breath sounds: Normal breath sounds. No wheezing or rales.     Comments: No crackles Abdominal:     General: Bowel sounds are normal. There is no distension or abdominal bruit.     Palpations: Abdomen is soft. There is no  mass.     Tenderness: There is no abdominal tenderness. There is no right CVA tenderness, left CVA tenderness or guarding.     Comments: No tenderness today  Musculoskeletal:        General: No  edema.     Cervical back: Normal range of motion and neck supple. No tenderness.     Right lower leg: No edema.     Left lower leg: No edema.  Lymphadenopathy:     Cervical: No cervical adenopathy.  Skin:    General: Skin is warm and dry.     Coloration: Skin is not jaundiced or pale.     Findings: No bruising, erythema or rash.  Neurological:     Mental Status: She is alert.     Sensory: No sensory deficit.     Coordination: Coordination normal.     Deep Tendon Reflexes: Reflexes are normal and symmetric. Reflexes normal.  Psychiatric:        Mood and Affect: Mood and affect normal.           Assessment & Plan:   Problem List Items Addressed This Visit      Cardiovascular and Mediastinum   Esophageal varices without bleeding (HCC)    Stable  On nadolol  Under care of GI      Aortic atherosclerosis (HCC)    Nl bp and cholesterol on crestor  Needs to quit smoking  No clinical changes         Digestive   Cirrhosis of liver ([place])    Per pt-stable and she has felt better recently than in the past several years Better appetite  No etoh intake      Gallstones    Pt has intermittent RUQ pain after eating  Recent US showed gallstones (had murphy sign at that time) I expect gallbladder related Sent for last surgical note from Dr Zenia Resides at [facility]  Not a good surgical candidate with copd and cirrhosis          Endocrine   Hyperlipidemia associated with type 2 diabetes mellitus ([place])    Disc goals for lipids and reasons to control them Rev last labs with pt Rev low sat fat diet in detail Good control with very low dose rosuvastatin 5 mg daily  LDL of 57      Controlled diabetes mellitus type 2 with complications ([place]) - Primary    Excellent control with metformin and diet  Lab Results  Component Value Date   HGBA1C 5.2 05/06/2020   Takes statin  Nl microalbumin          Nervous and Auditory   Neuropathy of both feet    Nl monofilament test today         Other   Smoker    She is thinking harder about quitting but has not initiated it  Known copd      Anemia    From chronic GI loss Getting IV iron infusions and getting better

## 2020-05-18 ENCOUNTER — Other Ambulatory Visit: Payer: Self-pay | Admitting: Adult Health

## 2020-05-18 DIAGNOSIS — F411 Generalized anxiety disorder: Secondary | ICD-10-CM

## 2020-06-08 NOTE — Progress Notes (Signed)
Cardiology Office Note:    Date:  06/09/2020   ID:  Christine Barton, DOB 04-11-1951, MRN [id number]  PCP:  [name], MD  Cardiologist:  No primary care provider on file.  Electrophysiologist:  None   Referring MD: [name], MD   Chief Complaint  Patient presents with  . Coronary Artery Disease    History of Present Illness:    Christine Barton is a 70 y.o. female with a hx of cirrhosis c/b esophageal varices, tobacco use, hyperlipidemia, type 2 diabetes who presents for follow-up.  She was referred by Dr. Glori Bickers for evaluation of coronary artery calcifications seen on chest CT, with initial appointment on 02/13/2019.  She underwent a chest CT for lung cancer screening on 01/18/2019, which showed multivessel coronary artery calcifications.  Patient denies any chest pain.  States that her activity has been limited by back pain but has been doing physical therapy.  She does report intermittent dyspnea with exertion, which she states improves with inhaler use.  She continues to smoke 1 pack/day.  Had been taken off diuretics but had to restart.  TTE on 02/21/2019 showed normal LV systolic function, normal RV function, no significant valvular disease.  Lexiscan Myoview on 02/15/2020 showed EF 55%, normal perfusion.  Since her last clinic visit, she reports that she has been doing well.  She denies any chest pain or shortness of breath.  Does report some right upper quadrant pain which has been attributed to gallstones.  She thought it might be related to her statin and stopped taking.  She continues to smoke, 0.5 to 0.75 packs/day.   Past Medical History:  Diagnosis Date  . Alcohol abuse, unspecified   . Breast cancer ([place]) 1998   Right  . Cataract    left eye  . Cervical spondylosis 2006   MRI  . Degeneration of cervical intervertebral disc 2006   MRI  . Diabetes mellitus without complication ([place])   . Diverticulosis of colon (without mention of hemorrhage)   . Hyperpotassemia    . Microscopic hematuria   . Mononeuritis of unspecified site   . Nonspecific abnormal results of liver function study   . Other abnormal glucose   . Other and unspecified hyperlipidemia    no per pt  . Other chronic nonalcoholic liver disease   . Personal history of chemotherapy   . Personal history of malignant neoplasm of breast   . Personal history of radiation therapy   . Pneumothorax, acute    right, spontaneous  . Tobacco use disorder   . Unspecified vitamin D deficiency     Past Surgical History:  Procedure Laterality Date  . BIOPSY  07/27/2019   Procedure: BIOPSY;  Surgeon: Yetta Flock, MD;  Location: [facility];  Service: Gastroenterology;;  . BREAST BIOPSY  9/03   Right  . BREAST LUMPECTOMY Right 1998  . CHEST TUBE INSERTION  11/02/2014  . COLONOSCOPY    . COLONOSCOPY WITH PROPOFOL N/A 07/27/2019   Procedure: COLONOSCOPY WITH PROPOFOL;  Surgeon: Yetta Flock, MD;  Location: [facility];  Service: Gastroenterology;  Laterality: N/A;  . ESOPHAGOGASTRODUODENOSCOPY (EGD) WITH PROPOFOL N/A 10/30/2018   Procedure: ESOPHAGOGASTRODUODENOSCOPY (EGD) WITH PROPOFOL;  Surgeon: Mauri Pole, MD;  Location: [facility];  Service: Endoscopy;  Laterality: N/A;  . ESOPHAGOGASTRODUODENOSCOPY (EGD) WITH PROPOFOL N/A 03/12/2019   Procedure: ESOPHAGOGASTRODUODENOSCOPY (EGD) WITH PROPOFOL;  Surgeon: Mauri Pole, MD;  Location: [facility];  Service: Endoscopy;  Laterality: N/A;  . ESOPHAGOGASTRODUODENOSCOPY (EGD)  WITH PROPOFOL N/A 07/27/2019   Procedure: ESOPHAGOGASTRODUODENOSCOPY (EGD) WITH PROPOFOL;  Surgeon: Yetta Flock, MD;  Location: [facility];  Service: Gastroenterology;  Laterality: N/A;  . EYE SURGERY  02/2017   cataract extraction with lens implant-left  . HOT HEMOSTASIS N/A 07/27/2019   Procedure: HOT HEMOSTASIS (ARGON PLASMA COAGULATION/BICAP);  Surgeon: Yetta Flock, MD;  Location: [facility];  Service: Gastroenterology;   Laterality: N/A;  . MOUTH SURGERY    . POLYPECTOMY  07/27/2019   Procedure: POLYPECTOMY;  Surgeon: Yetta Flock, MD;  Location: [facility];  Service: Gastroenterology;;  . TUBAL LIGATION      Current Medications: Current Meds  Medication Sig  . albuterol (VENTOLIN HFA) 108 (90 Base) MCG/ACT inhaler INHALE 2 PUFFS INTO THE LUNGS EVERY 4 HOURS AS NEEDED FOR WHEEZE OR FOR SHORTNESS OF BREATH  . budesonide-formoterol (SYMBICORT) 160-4.5 MCG/ACT inhaler Inhale 2 puffs into the lungs 2 (two) times daily.  . cholecalciferol (VITAMIN D3) 25 MCG (1000 UNIT) tablet Take 1,000 Units by mouth 2 (two) times daily.  . clorazepate (TRANXENE) 3.75 MG tablet TAKE 1 TABLET (3.75 MG TOTAL) BY MOUTH 2 (TWO) TIMES DAILY AS NEEDED FOR ANXIETY.  . furosemide (LASIX) 20 MG tablet TAKE 1 TABLET BY MOUTH EVERY DAY  . lactulose (CHRONULAC) 10 GM/15ML solution Take 15 mLs (10 g total) by mouth 3 (three) times daily.  . metFORMIN (GLUCOPHAGE-XR) 500 MG 24 hr tablet TAKE 1 TABLET BY MOUTH EVERY DAY WITH BREAKFAST  . Multiple Vitamin (MULTIVITAMIN) tablet Take 1 tablet by mouth daily.  . Multiple Vitamins-Minerals (PRESERVISION AREDS 2) CAPS Take 1 capsule by mouth 2 (two) times daily.  . mupirocin ointment (BACTROBAN) 2 % APPLY 1 APPLICATION TOPICALLY 2 (TWO) TIMES DAILY. TO AFFECTED AREAS  . nadolol (CORGARD) 20 MG tablet TAKE 1 TABLET BY MOUTH EVERY DAY  . nystatin (MYCOSTATIN) 100000 UNIT/ML suspension TAKE 5 MLS (500,000 UNITS TOTAL) BY MOUTH 3 (THREE) TIMES DAILY. SWISH AND SWALLOW  . pantoprazole (PROTONIX) 40 MG tablet TAKE 1 TABLET (40 MG TOTAL) BY MOUTH 2 (TWO) TIMES DAILY BEFORE A MEAL.  [name] Glycol-Propyl Glycol (SYSTANE OP) Place 1 drop into the left eye daily as needed (dryness).  Marland Kitchen spironolactone (ALDACTONE) 25 MG tablet Take 1 tablet (25 mg total) by mouth daily.  [name] 0.6 % SOLN Apply 1 drop to eye 2 (two) times daily.  . temazepam (RESTORIL) 7.5 MG capsule TAKE 1 CAPSULE  (7.5 MG TOTAL) BY MOUTH AT BEDTIME AS NEEDED FOR SLEEP.  Marland Kitchen traZODone (DESYREL) 50 MG tablet   . XIFAXAN 550 MG TABS tablet TAKE 1 TABLET BY MOUTH 2 TIMES DAILY.     Allergies:   Oxycodone and Glipizide   Social History   Socioeconomic History  . Marital status: Single    Spouse name: Not on file  . Number of children: 1  . Years of education: Not on file  . Highest education level: Not on file  Occupational History  . Occupation: retired    Fish farm manager: [employer]  Tobacco Use  . Smoking status: Current Every Day Smoker    Packs/day: 1.00    Years: 53.00    Pack years: 53.00    Types: Cigarettes  . Smokeless tobacco: Never Used  . Tobacco comment: tobacco info given 04/27/2018  Vaping Use  . Vaping Use: Never used  Substance and Sexual Activity  . Alcohol use: Not Currently  . Drug use: No  . Sexual activity: Not Currently  Other Topics Concern  .  Not on file  Social History Narrative   Divorced      1 child      Works at [employer] [place]: [place]   . Difficulty of Paying Living Expenses: Not hard at all  Food Insecurity: No Food Insecurity  . Worried About Charity fundraiser in the Last Year: Never true  . Ran Out of Food in the Last Year: Never true  Transportation Needs: No Transportation Needs  . Lack of Transportation (Medical): No  . Lack of Transportation (Non-Medical): No  Physical Activity: Insufficiently Active  . Days of Exercise per Week: 7 days  . Minutes of Exercise per Session: 10 min  Stress: Stress Concern Present  . Feeling of Stress : To some extent  Social Connections: Not on file     Family History: The patient's \ family history includes Colon cancer in her maternal uncle; Heart attack in her father; Heart failure in her father; Stroke in her mother. There is no history of Esophageal cancer, Rectal cancer, Stomach cancer, or Pancreatic cancer.  ROS:   Please see  the history of present illness.    \All other systems reviewed and are negative.  EKGs/Labs/Other Studies Reviewed:    The following studies were reviewed today:   EKG:  EKG is ordered today.  The ekg ordered demonstrates normal sinus rhythm, rate 74, PAC, no ST/T abnormalities  Recent Labs: 07/27/2019: Magnesium 1.5 11/02/2019: TSH 3.95 04/16/2020: ALT 12; BUN 29; Creatinine, Ser 0.86; Hemoglobin 11.7; Platelets 158.0; Potassium 4.3; Sodium 136  Recent Lipid Panel    Component Value Date/Time   CHOL 127 05/06/2020 0903   TRIG 135.0 05/06/2020 0903   HDL 43.00 05/06/2020 0903   CHOLHDL 3 05/06/2020 0903   VLDL 27.0 05/06/2020 0903   LDLCALC 57 05/06/2020 0903   LDLDIRECT 83.0 10/31/2017 0838    Physical Exam:    VS:  BP (!) 117/59   Pulse 74   Ht 5' 5"  (1.651 m)   Wt 111 lb 12.8 oz (50.7 kg)   LMP  (LMP Unknown)   SpO2 100%   BMI 18.60 kg/m     Wt Readings from Last 3 Encounters:  06/09/20 111 lb 12.8 oz (50.7 kg)  05/12/20 113 lb 1 oz (51.3 kg)  04/23/20 109 lb (49.4 kg)     GEN: Cachectic, in no acute distress HEENT: Normal NECK: No JVD CARDIAC: RRR, no murmurs RESPIRATORY:  Clear to auscultation without rales, wheezing or rhonchi  ABDOMEN: Soft, non-tender, non-distended MUSCULOSKELETAL:  No edema SKIN: Warm and dry NEUROLOGIC:  Alert and oriented x 3 PSYCHIATRIC:  Normal affect   ASSESSMENT:    1. Coronary artery disease involving native coronary artery of native heart without angina pectoris   2. DOE (dyspnea on exertion)   3. Tobacco use   4. Cachexia ([place])    PLAN:    In order of problems listed above:  Coronary artery disease: CT chest showed multivessel coronary calcifications.  She denies any chest pain.  She does report dyspnea on exertion, which could represent anginal equivalent.  Lexiscan Myoview on 02/15/2020 showed EF 55%, normal perfusion. -She appears compensated in regards to her cirrhosis.  Discussed with her hepatologist, Dr. [name],  and OK to start on statin.  Started on rosuvastatin 5 mg daily in 02/2019.  LDL 57 on 05/06/2020.  LFTs have been stable.  She stopped taking it because  she thought statin was causing her right upper quadrant pain, but recent ultrasound shows gallstones are likely the cause.  Recommended restarting statin, patient said she would consider -Would not start on daily aspirin given issues with GI bleeding  Dyspnea: Suspect related to COPD, as improves with inhaler use. TTE on 02/21/2019 showed no structural heart disease.  Lexiscan Myoview showed normal perfusion on 02/15/2020.  Type 2 diabetes: Well-controlled, A1c is normal now 5.2% on 05/06/2020  Tobacco use: Smokes 1ppd.  Patient counseled on risks of tobacco use and cessation strongly encouraged.  Refer to care guide to assist with smoking cessation.  Cachexia: Referred to nutrition  RTC in 1 year  Medication Adjustments/Labs and Tests Ordered: Current medicines are reviewed at length with the patient today.  Concerns regarding medicines are outlined above.  Orders Placed This Encounter  Procedures  . EKG 12-Lead   No orders of the defined types were placed in this encounter.   Patient Instructions  Medication Instructions:  Restart your cholesterol medication (rosuvastatin or Crestor)  *If you need a refill on your cardiac medications before your next appointment, please call your pharmacy*  Follow-Up: At [facility], you and your health needs are our priority.  As part of our continuing mission to provide you with exceptional heart care, we have created designated Provider Care Teams.  These Care Teams include your primary Cardiologist (physician) and Advanced Practice Providers (APPs -  Physician Assistants and Nurse Practitioners) who all work together to provide you with the care you need, when you need it.  We recommend signing up for the patient portal called "MyChart".  Sign up information is provided on this After Visit  Summary.  MyChart is used to connect with patients for Virtual Visits (Telemedicine).  Patients are able to view lab/test results, encounter notes, upcoming appointments, etc.  Non-urgent messages can be sent to your provider as well.   To learn more about what you can do with MyChart, go to [email or web address].    Your next appointment:   12 month(s)  The format for your next appointment:   In Person  Provider:   Oswaldo Milian, MD   Other Instructions Call [phone], option 6 to discuss your concerns with our billing department     Signed, Donato Heinz, MD  06/09/2020 5:53 PM    [place]

## 2020-06-09 ENCOUNTER — Other Ambulatory Visit: Payer: Self-pay

## 2020-06-09 ENCOUNTER — Encounter: Payer: Self-pay | Admitting: Cardiology

## 2020-06-09 ENCOUNTER — Ambulatory Visit: Payer: Medicare HMO | Admitting: Cardiology

## 2020-06-09 VITALS — BP 117/59 | HR 74 | Ht 65.0 in | Wt 111.8 lb

## 2020-06-09 DIAGNOSIS — Z720 Tobacco use: Secondary | ICD-10-CM

## 2020-06-09 DIAGNOSIS — I2510 Atherosclerotic heart disease of native coronary artery without angina pectoris: Secondary | ICD-10-CM | POA: Diagnosis not present

## 2020-06-09 DIAGNOSIS — R64 Cachexia: Secondary | ICD-10-CM

## 2020-06-09 DIAGNOSIS — R0600 Dyspnea, unspecified: Secondary | ICD-10-CM | POA: Diagnosis not present

## 2020-06-09 DIAGNOSIS — R0609 Other forms of dyspnea: Secondary | ICD-10-CM

## 2020-06-09 NOTE — Patient Instructions (Addendum)
Medication Instructions:  Restart your cholesterol medication (rosuvastatin or Crestor)  *If you need a refill on your cardiac medications before your next appointment, please call your pharmacy*  Follow-Up: At [facility], you and your health needs are our priority.  As part of our continuing mission to provide you with exceptional heart care, we have created designated Provider Care Teams.  These Care Teams include your primary Cardiologist (physician) and Advanced Practice Providers (APPs -  Physician Assistants and Nurse Practitioners) who all work together to provide you with the care you need, when you need it.  We recommend signing up for the patient portal called "MyChart".  Sign up information is provided on this After Visit Summary.  MyChart is used to connect with patients for Virtual Visits (Telemedicine).  Patients are able to view lab/test results, encounter notes, upcoming appointments, etc.  Non-urgent messages can be sent to your provider as well.   To learn more about what you can do with MyChart, go to [email or web address].    Your next appointment:   12 month(s)  The format for your next appointment:   In Person  Provider:   Oswaldo Milian, MD   Other Instructions Call [phone], option 6 to discuss your concerns with our billing department

## 2020-06-10 ENCOUNTER — Telehealth: Payer: Self-pay

## 2020-06-10 ENCOUNTER — Other Ambulatory Visit: Payer: Self-pay | Admitting: Gastroenterology

## 2020-06-10 ENCOUNTER — Telehealth: Payer: Self-pay | Admitting: Medical Oncology

## 2020-06-10 DIAGNOSIS — Z0000 Encounter for general adult medical examination without abnormal findings: Secondary | ICD-10-CM

## 2020-06-10 NOTE — Telephone Encounter (Signed)
Called patient to discuss health coaching for smoking cessation per Dr. Newman Nickels referral. Patient is interested and is scheduled for 06/11/20 at 1:30pm. Patient will be called by Care Guide at this time.

## 2020-06-10 NOTE — Telephone Encounter (Signed)
Issues about increase in charge for venofer  in jan 2022 compared to dec tx. She will call back to accts payable.

## 2020-06-11 ENCOUNTER — Other Ambulatory Visit: Payer: Self-pay

## 2020-06-11 ENCOUNTER — Ambulatory Visit (INDEPENDENT_AMBULATORY_CARE_PROVIDER_SITE_OTHER): Payer: Medicare HMO

## 2020-06-11 DIAGNOSIS — Z0000 Encounter for general adult medical examination without abnormal findings: Secondary | ICD-10-CM

## 2020-06-11 NOTE — Progress Notes (Signed)
Christine Barton Appointment Outcome:  Completed, Session #: Initial  AGREEMENTS SECTION   Overall Goal(s): Smoking cessation                                            Agreement/Action Steps:  Feed cats first, coffee, then smoke Aim for 15 cigarettes daily Space out smoking 20 mins between cigarettes Pick up cigarette - Ask self, Do I need a cigarette now? Call [facility]   Progress Notes:  Patient shared that she has quit before for 7 months when her right lung collapsed. Patient stated that she walked past someone smoking and it triggered her to want to smoke. Patient stated that she has tried to cut back by putting cigarettes down when she picks them up instead of smoking. Patient mentioned that she's been thinking about putting cigarettes outside to deter her from smoking. She believes that it will cut her smoking in half especially if it is cold outside.  Patient reported that she currently smokes about 1/2 - 1 pack of cigarettes per day and sometimes more if she is stressed. Patient stated that she smokes the most when she is watching tv. This is due to boredom or trying to relax. Patient stated that she must have a cigarette as soon as she wakes up and after she eats. Patient typically smokes first, feed the cats, then put a pot of coffee on. Patient reported that she can go about 20 minutes between cigarettes and smokes a whole cigarette at a time.   Patient stated that she is motivated to quit to improve her breathing and other health reasons.   . Indicators of Success and Accountability:  What are the mile markers along your path to reaching your desired changes  . Readiness: How ready are you to make the changes you have identified?  Rate your readiness on a 1-5 scale with 5 being the readiest.  . Strengths and Supports: Who will support you? What can you rely on? Personal strengths that you will tap into? . Challenges and Barriers: What could block you or prevent you meeting  your goal?  Coaching Outcomes: Patient has not set a quit date yet. Patient is just adamant about quitting in general without a timeframe at this time. Patient is interested in nicotine patches for smoking cessation. In the meantime, the patient will work on the following action steps:  Agreement/Action Steps:  Feed cats first, coffee, then smoke Aim for 15 cigarettes daily Space out smoking 20 mins between cigarettes Pick up cigarette - Ask self, Do I need a cigarette now? Call [facility]  Patient will be focusing on changing her smoking routine and the underlying reasons for her smoking by questioning her smoking.  Patient has been mailed a copy of the health coaching agreement for her records and a copy to sign and return on 06/10/20. Patient has been sent a quit plan/educational material that has additional steps on quitting.  Note: Sent message to Dr. Gardiner Rhyme regarding prescription for nicotine patches for patient.

## 2020-06-11 NOTE — Patient Instructions (Signed)
Steps to Quit Smoking Smoking tobacco is the leading cause of preventable death. It can affect almost every organ in the body. Smoking puts you and people around you at risk for many serious, long-lasting (chronic) diseases. Quitting smoking can be hard, but it is one of the best things that you can do for your health. It is never too late to quit. How do I get ready to quit? When you decide to quit smoking, make a plan to help you succeed. Before you quit:  Pick a date to quit. Set a date within the next 2 weeks to give you time to prepare.  Write down the reasons why you are quitting. Keep this list in places where you will see it often.  Tell your family, friends, and co-workers that you are quitting. Their support is important.  Talk with your doctor about the choices that may help you quit.  Find out if your health insurance will pay for these treatments.  Know the people, places, things, and activities that make you want to smoke (triggers). Avoid them. What first steps can I take to quit smoking?  Throw away all cigarettes at home, at work, and in your car.  Throw away the things that you use when you smoke, such as ashtrays and lighters.  Clean your car. Make sure to empty the ashtray.  Clean your home, including curtains and carpets. What can I do to help me quit smoking? Talk with your doctor about taking medicines and seeing a counselor at the same time. You are more likely to succeed when you do both.  If you are pregnant or breastfeeding, talk with your doctor about counseling or other ways to quit smoking. Do not take medicine to help you quit smoking unless your doctor tells you to do so. To quit smoking: Quit right away  Quit smoking totally, instead of slowly cutting back on how much you smoke over a period of time.  Go to counseling. You are more likely to quit if you go to counseling sessions regularly. Take medicine You may take medicines to help you quit. Some  medicines need a prescription, and some you can buy over-the-counter. Some medicines may contain a drug called nicotine to replace the nicotine in cigarettes. Medicines may:  Help you to stop having the desire to smoke (cravings).  Help to stop the problems that come when you stop smoking (withdrawal symptoms). Your doctor may ask you to use:  Nicotine patches, gum, or lozenges.  Nicotine inhalers or sprays.  Non-nicotine medicine that is taken by mouth. Find resources Find resources and other ways to help you quit smoking and remain smoke-free after you quit. These resources are most helpful when you use them often. They include:  Online chats with a Social worker.  Phone quitlines.  Printed Furniture conservator/restorer.  Support groups or group counseling.  Text messaging programs.  Mobile phone apps. Use apps on your mobile phone or tablet that can help you stick to your quit plan. There are many free apps for mobile phones and tablets as well as websites. Examples include Quit Guide from the [employer] and smokefree.gov   What things can I do to make it easier to quit?  Talk to your family and friends. Ask them to support and encourage you.  Call a phone quitline (1-800-QUIT-NOW), reach out to support groups, or work with a Social worker.  Ask people who smoke to not smoke around you.  Avoid places that make you want to smoke,  such as: ? Bars. ? Parties. ? Smoke-break areas at work.  Spend time with people who do not smoke.  Lower the stress in your life. Stress can make you want to smoke. Try these things to help your stress: ? Getting regular exercise. ? Doing deep-breathing exercises. ? Doing yoga. ? Meditating. ? Doing a body scan. To do this, close your eyes, focus on one area of your body at a time from head to toe. Notice which parts of your body are tense. Try to relax the muscles in those areas.   How will I feel when I quit smoking? Day 1 to 3 weeks Within the first 24 hours,  you may start to have some problems that come from quitting tobacco. These problems are very bad 2-3 days after you quit, but they do not often last for more than 2-3 weeks. You may get these symptoms:  Mood swings.  Feeling restless, nervous, angry, or annoyed.  Trouble concentrating.  Dizziness.  Strong desire for high-sugar foods and nicotine.  Weight gain.  Trouble pooping (constipation).  Feeling like you may vomit (nausea).  Coughing or a sore throat.  Changes in how the medicines that you take for other issues work in your body.  Depression.  Trouble sleeping (insomnia). Week 3 and afterward After the first 2-3 weeks of quitting, you may start to notice more positive results, such as:  Better sense of smell and taste.  Less coughing and sore throat.  Slower heart rate.  Lower blood pressure.  Clearer skin.  Better breathing.  Fewer sick days. Quitting smoking can be hard. Do not give up if you fail the first time. Some people need to try a few times before they succeed. Do your best to stick to your quit plan, and talk with your doctor if you have any questions or concerns. Summary  Smoking tobacco is the leading cause of preventable death. Quitting smoking can be hard, but it is one of the best things that you can do for your health.  When you decide to quit smoking, make a plan to help you succeed.  Quit smoking right away, not slowly over a period of time.  When you start quitting, seek help from your doctor, family, or friends. This information is not intended to replace advice given to you by your health care provider. Make sure you discuss any questions you have with your health care provider. Document Revised: 12/22/2018 Document Reviewed: 06/17/2018 Elsevier Patient Education  [place].

## 2020-06-12 ENCOUNTER — Other Ambulatory Visit: Payer: Self-pay | Admitting: Family Medicine

## 2020-06-12 ENCOUNTER — Other Ambulatory Visit: Payer: Self-pay | Admitting: *Deleted

## 2020-06-12 MED ORDER — NICOTINE 21 MG/24HR TD PT24: 21.0000 mg | MEDICATED_PATCH | Freq: Every day | TRANSDERMAL | 0 refills | Status: DC

## 2020-06-24 ENCOUNTER — Other Ambulatory Visit: Payer: Self-pay

## 2020-06-24 ENCOUNTER — Ambulatory Visit (INDEPENDENT_AMBULATORY_CARE_PROVIDER_SITE_OTHER): Payer: Medicare HMO

## 2020-06-24 DIAGNOSIS — Z0000 Encounter for general adult medical examination without abnormal findings: Secondary | ICD-10-CM

## 2020-06-24 NOTE — Progress Notes (Signed)
Appointment Outcome:  Completed, Session #: 1  AGREEMENTS SECTION   Overall Goal(s): Smoking cessation                                            Agreement/Action Steps:  Feed cats first, coffee, then smoke Aim for 15 cigarettes daily Space out smoking 20 mins between cigarettes Pick up cigarette - Ask self, Do I need a cigarette now? Call [facility]  Progress Notes:  Patient was called during scheduled health coaching session for smoking cessation. Patient informed Care Guide that she called [facility] for the nicotine patches and was told that she had to participate in their counseling program for 4 weeks in order to receive the patches. Patient is waiting to receive nicotine patches in the mail. Patient has set a quit date for July 27, 2020.   Patient stated that she wants to quit as soon as possible with the aid of nicotine replacement therapy, which she stated would be better for her than trying to cut down smoking over time.    Coaching Outcomes: Patient decided to go with [facility] counseling services and terminated the health coaching contract verbally at 1:46pm. Patient is aware that she can still participate in health coaching. Patient stated that she appreciated the offer. Care Guide will follow up with patient after four weeks to see if she needs further coaching.

## 2020-06-25 ENCOUNTER — Inpatient Hospital Stay: Payer: Medicare HMO

## 2020-06-25 ENCOUNTER — Inpatient Hospital Stay: Payer: Medicare HMO | Attending: Physician Assistant | Admitting: Internal Medicine

## 2020-06-25 ENCOUNTER — Telehealth: Payer: Self-pay | Admitting: Internal Medicine

## 2020-06-25 ENCOUNTER — Other Ambulatory Visit: Payer: Self-pay

## 2020-06-25 VITALS — BP 117/67 | HR 77 | Resp 18 | Ht 65.0 in | Wt 111.3 lb

## 2020-06-25 DIAGNOSIS — D509 Iron deficiency anemia, unspecified: Secondary | ICD-10-CM | POA: Insufficient documentation

## 2020-06-25 DIAGNOSIS — Z79899 Other long term (current) drug therapy: Secondary | ICD-10-CM | POA: Insufficient documentation

## 2020-06-25 DIAGNOSIS — E119 Type 2 diabetes mellitus without complications: Secondary | ICD-10-CM | POA: Diagnosis not present

## 2020-06-25 DIAGNOSIS — E785 Hyperlipidemia, unspecified: Secondary | ICD-10-CM | POA: Diagnosis not present

## 2020-06-25 DIAGNOSIS — D500 Iron deficiency anemia secondary to blood loss (chronic): Secondary | ICD-10-CM

## 2020-06-25 DIAGNOSIS — R5383 Other fatigue: Secondary | ICD-10-CM | POA: Diagnosis not present

## 2020-06-25 DIAGNOSIS — Z7984 Long term (current) use of oral hypoglycemic drugs: Secondary | ICD-10-CM | POA: Diagnosis not present

## 2020-06-25 LAB — CBC WITH DIFFERENTIAL (CANCER CENTER ONLY)
Abs Immature Granulocytes: 0.07 10*3/uL (ref 0.00–0.07)
Basophils Absolute: 0 10*3/uL (ref 0.0–0.1)
Basophils Relative: 0 %
Eosinophils Absolute: 0.1 10*3/uL (ref 0.0–0.5)
Eosinophils Relative: 2 %
HCT: 33.7 % — ABNORMAL LOW (ref 36.0–46.0)
Hemoglobin: 10.2 g/dL — ABNORMAL LOW (ref 12.0–15.0)
Immature Granulocytes: 1 %
Lymphocytes Relative: 24 %
Lymphs Abs: 1.7 10*3/uL (ref 0.7–4.0)
MCH: 25.5 pg — ABNORMAL LOW (ref 26.0–34.0)
MCHC: 30.3 g/dL (ref 30.0–36.0)
MCV: 84.3 fL (ref 80.0–100.0)
Monocytes Absolute: 0.8 10*3/uL (ref 0.1–1.0)
Monocytes Relative: 11 %
Neutro Abs: 4.4 10*3/uL (ref 1.7–7.7)
Neutrophils Relative %: 62 %
Platelet Count: 183 10*3/uL (ref 150–400)
RBC: 4 MIL/uL (ref 3.87–5.11)
RDW: 17.2 % — ABNORMAL HIGH (ref 11.5–15.5)
WBC Count: 7.2 10*3/uL (ref 4.0–10.5)
nRBC: 0 % (ref 0.0–0.2)

## 2020-06-25 LAB — FERRITIN: Ferritin: 19 ng/mL (ref 11–307)

## 2020-06-25 LAB — IRON AND TIBC
Iron: 34 ug/dL — ABNORMAL LOW (ref 41–142)
Saturation Ratios: 6 % — ABNORMAL LOW (ref 21–57)
TIBC: 530 ug/dL — ABNORMAL HIGH (ref 236–444)
UIBC: 496 ug/dL — ABNORMAL HIGH (ref 120–384)

## 2020-06-25 NOTE — Progress Notes (Signed)
[name] [phone]   [phone]  OFFICE PROGRESS NOTE  Tower, Christine Fanny, MD [place] [address]  DIAGNOSIS: Anemia secondary to GI occult blood loss.   PRIOR THERAPY: Oral iron supplements. Discontinued due to intolerance (severe abdominal pain)  CURRENT THERAPY: IV iron infusions with venofer PRN. Last infusion on 12/14/2019  INTERVAL HISTORY: Christine Barton 70 y.o. female returns to the clinic today for follow-up visit.  The patient is feeling fine today with no concerning complaints except for mild fatigue.  She continues to have gastrointestinal bleeding.  She has been treated with iron infusion most recently in January 2022 with Venofer.  The patient tolerated the iron infusion well.  She denied having any current chest pain, shortness of breath, cough or hemoptysis.  She denied having any fever or chills.  She has no nausea, vomiting, diarrhea or constipation.  She has no headache or visual changes.  She is here today for evaluation with repeat CBC, iron study and ferritin.  MEDICAL HISTORY: Past Medical History:  Diagnosis Date  . Alcohol abuse, unspecified   . Breast cancer ([place]) 1998   Right  . Cataract    left eye  . Cervical spondylosis 2006   MRI  . Degeneration of cervical intervertebral disc 2006   MRI  . Diabetes mellitus without complication ([place])   . Diverticulosis of colon (without mention of hemorrhage)   . Hyperpotassemia   . Microscopic hematuria   . Mononeuritis of unspecified site   . Nonspecific abnormal results of liver function study   . Other abnormal glucose   . Other and unspecified hyperlipidemia    no per pt  . Other chronic nonalcoholic liver disease   . Personal history of chemotherapy   . Personal history of malignant neoplasm of breast   . Personal history of radiation therapy   . Pneumothorax, acute    right, spontaneous  . Tobacco use disorder   . Unspecified vitamin  D deficiency     ALLERGIES:  is allergic to oxycodone and glipizide.  MEDICATIONS:  Current Outpatient Medications  Medication Sig Dispense Refill  . albuterol (VENTOLIN HFA) 108 (90 Base) MCG/ACT inhaler INHALE 2 PUFFS INTO THE LUNGS EVERY 4 HOURS AS NEEDED FOR WHEEZE OR FOR SHORTNESS OF BREATH 18 each 3  . budesonide-formoterol (SYMBICORT) 160-4.5 MCG/ACT inhaler Inhale 2 puffs into the lungs 2 (two) times daily. 1 each 11  . cholecalciferol (VITAMIN D3) 25 MCG (1000 UNIT) tablet Take 1,000 Units by mouth 2 (two) times daily.    . clorazepate (TRANXENE) 3.75 MG tablet TAKE 1 TABLET (3.75 MG TOTAL) BY MOUTH 2 (TWO) TIMES DAILY AS NEEDED FOR ANXIETY. 60 tablet 2  . furosemide (LASIX) 20 MG tablet TAKE 1 TABLET BY MOUTH EVERY DAY 90 tablet 1  . lactulose (CHRONULAC) 10 GM/15ML solution Take 15 mLs (10 g total) by mouth 3 (three) times daily. 236 mL 11  . metFORMIN (GLUCOPHAGE-XR) 500 MG 24 hr tablet TAKE 1 TABLET BY MOUTH EVERY DAY WITH BREAKFAST 90 tablet 1  . Multiple Vitamin (MULTIVITAMIN) tablet Take 1 tablet by mouth daily.    . Multiple Vitamins-Minerals (PRESERVISION AREDS 2) CAPS Take 1 capsule by mouth 2 (two) times daily.    . mupirocin ointment (BACTROBAN) 2 % APPLY 1 APPLICATION TOPICALLY 2 (TWO) TIMES DAILY. TO AFFECTED AREAS 22 g 2  . nadolol (CORGARD) 20 MG tablet TAKE 1 TABLET BY MOUTH EVERY DAY 90  tablet 3  . nicotine (NICODERM CQ - DOSED IN MG/24 HOURS) 21 mg/24hr patch Place 1 patch (21 mg total) onto the skin daily. 42 patch 0  . nystatin (MYCOSTATIN) 100000 UNIT/ML suspension TAKE 5 MLS (500,000 UNITS TOTAL) BY MOUTH 3 (THREE) TIMES DAILY. SWISH AND SWALLOW 120 mL 0  . pantoprazole (PROTONIX) 40 MG tablet TAKE 1 TABLET (40 MG TOTAL) BY MOUTH 2 (TWO) TIMES DAILY BEFORE A MEAL. 180 tablet 1  . Polyethyl Glycol-Propyl Glycol (SYSTANE OP) Place 1 drop into the left eye daily as needed (dryness).    . rosuvastatin (CRESTOR) 5 MG tablet TAKE 1 TABLET (5 MG TOTAL) BY MOUTH DAILY  AT 6 PM. 90 tablet 3  . spironolactone (ALDACTONE) 25 MG tablet Take 1 tablet (25 mg total) by mouth daily. 30 tablet 1  . SYSTANE COMPLETE 0.6 % SOLN Apply 1 drop to eye 2 (two) times daily.    . temazepam (RESTORIL) 7.5 MG capsule TAKE 1 CAPSULE (7.5 MG TOTAL) BY MOUTH AT BEDTIME AS NEEDED FOR SLEEP. 30 capsule 3  . traZODone (DESYREL) 50 MG tablet     . XIFAXAN 550 MG TABS tablet TAKE 1 TABLET BY MOUTH 2 TIMES DAILY. 60 tablet 3   No current facility-administered medications for this visit.    SURGICAL HISTORY:  Past Surgical History:  Procedure Laterality Date  . BIOPSY  07/27/2019   Procedure: BIOPSY;  Surgeon: Yetta Flock, MD;  Location: [facility];  Service: Gastroenterology;;  . BREAST BIOPSY  9/03   Right  . BREAST LUMPECTOMY Right 1998  . CHEST TUBE INSERTION  11/02/2014  . COLONOSCOPY    . COLONOSCOPY WITH PROPOFOL N/A 07/27/2019   Procedure: COLONOSCOPY WITH PROPOFOL;  Surgeon: Yetta Flock, MD;  Location: [facility];  Service: Gastroenterology;  Laterality: N/A;  . ESOPHAGOGASTRODUODENOSCOPY (EGD) WITH PROPOFOL N/A 10/30/2018   Procedure: ESOPHAGOGASTRODUODENOSCOPY (EGD) WITH PROPOFOL;  Surgeon: Mauri Pole, MD;  Location: [facility];  Service: Endoscopy;  Laterality: N/A;  . ESOPHAGOGASTRODUODENOSCOPY (EGD) WITH PROPOFOL N/A 03/12/2019   Procedure: ESOPHAGOGASTRODUODENOSCOPY (EGD) WITH PROPOFOL;  Surgeon: Mauri Pole, MD;  Location: [facility];  Service: Endoscopy;  Laterality: N/A;  . ESOPHAGOGASTRODUODENOSCOPY (EGD) WITH PROPOFOL N/A 07/27/2019   Procedure: ESOPHAGOGASTRODUODENOSCOPY (EGD) WITH PROPOFOL;  Surgeon: Yetta Flock, MD;  Location: [facility];  Service: Gastroenterology;  Laterality: N/A;  . EYE SURGERY  02/2017   cataract extraction with lens implant-left  . HOT HEMOSTASIS N/A 07/27/2019   Procedure: HOT HEMOSTASIS (ARGON PLASMA COAGULATION/BICAP);  Surgeon: Yetta Flock, MD;  Location: [facility];   Service: Gastroenterology;  Laterality: N/A;  . MOUTH SURGERY    . POLYPECTOMY  07/27/2019   Procedure: POLYPECTOMY;  Surgeon: Yetta Flock, MD;  Location: [facility];  Service: Gastroenterology;;  . TUBAL LIGATION      REVIEW OF SYSTEMS:  A comprehensive review of systems was negative except for: Constitutional: positive for fatigue   PHYSICAL EXAMINATION: General appearance: alert, cooperative, fatigued and no distress Head: Normocephalic, without obvious abnormality, atraumatic Neck: no adenopathy, no JVD, supple, symmetrical, trachea midline and thyroid not enlarged, symmetric, no tenderness/mass/nodules Lymph nodes: Cervical, supraclavicular, and axillary nodes normal. Resp: clear to auscultation bilaterally Back: symmetric, no curvature. ROM normal. No CVA tenderness. Cardio: regular rate and rhythm, S1, S2 normal, no murmur, click, rub or gallop GI: soft, non-tender; bowel sounds normal; no masses,  no organomegaly Extremities: extremities normal, atraumatic, no cyanosis or edema  ECOG PERFORMANCE STATUS: 1 - Symptomatic but completely ambulatory  Blood pressure 117/67, pulse 77, resp. rate 18, height 5' 5"  (1.651 m), weight 111 lb 4.8 oz (50.5 kg).  LABORATORY DATA: Lab Results  Component Value Date   WBC 7.2 06/25/2020   HGB 10.2 (L) 06/25/2020   HCT 33.7 (L) 06/25/2020   MCV 84.3 06/25/2020   PLT 183 06/25/2020      Chemistry      Component Value Date/Time   NA 136 04/16/2020 1136   NA 140 12/11/2019 1617   K 4.3 04/16/2020 1136   CL 100 04/16/2020 1136   CO2 31 04/16/2020 1136   BUN 29 (H) 04/16/2020 1136   BUN 35 (H) 12/11/2019 1617   CREATININE 0.86 04/16/2020 1136      Component Value Date/Time   CALCIUM 9.7 04/16/2020 1136   ALKPHOS 82 04/16/2020 1136   AST 20 04/16/2020 1136   ALT 12 04/16/2020 1136   BILITOT 0.7 04/16/2020 1136   BILITOT 0.7 12/11/2019 1617       RADIOGRAPHIC STUDIES: No results found.  ASSESSMENT AND PLAN: This is  a very pleasant 70 years old white female with iron deficiency anemia secondary to likely chronic blood loss secondary to AV malformation as well as lack of dietary iron supplements.  She has intolerance to the oral iron tablets. She was also recently treated with iron infusion with Venofer and tolerated the infusion well. Repeat CBC today showed persistent anemia as well as iron deficiency. She continues to have gastrointestinal blood loss and she is followed by gastroenterology. I recommended for the patient to proceed with iron infusion again with Venofer 300 mg IV weekly for 3 weeks. I will see the patient back for follow-up visit in around 6 weeks for evaluation and repeat blood work. She was advised to call immediately if she has any other concerning symptoms in the interval.  The patient voices understanding of current disease status and treatment options and is in agreement with the current care plan. All questions were answered. The patient knows to call the clinic with any problems, questions or concerns. We can certainly see the patient much sooner if necessary.   Disclaimer: This note was dictated with voice recognition software. Similar sounding words can inadvertently be transcribed and may not be corrected upon review.

## 2020-06-25 NOTE — Telephone Encounter (Signed)
Called pt to confirm appts added per 3/16 los- pt is aware 3/18 and 3/25 appts are scheduled at [facility] and 4/1 appt is scheduled in [place].

## 2020-06-27 ENCOUNTER — Inpatient Hospital Stay: Payer: Medicare HMO | Attending: Oncology

## 2020-06-27 ENCOUNTER — Ambulatory Visit: Payer: Medicare HMO

## 2020-06-27 ENCOUNTER — Other Ambulatory Visit: Payer: Self-pay

## 2020-06-27 VITALS — BP 120/72 | HR 74

## 2020-06-27 DIAGNOSIS — D509 Iron deficiency anemia, unspecified: Secondary | ICD-10-CM | POA: Insufficient documentation

## 2020-06-27 DIAGNOSIS — D500 Iron deficiency anemia secondary to blood loss (chronic): Secondary | ICD-10-CM

## 2020-06-27 DIAGNOSIS — K5520 Angiodysplasia of colon without hemorrhage: Secondary | ICD-10-CM

## 2020-06-27 MED ORDER — SODIUM CHLORIDE 0.9 % IV SOLN
Freq: Once | INTRAVENOUS | Status: AC
  Filled 2020-06-27: qty 250

## 2020-06-27 MED ORDER — IRON SUCROSE 20 MG/ML IV SOLN
300.0000 mg | Freq: Once | INTRAVENOUS | Status: AC
  Administered 2020-06-27: 300 mg via INTRAVENOUS
  Filled 2020-06-27: qty 15

## 2020-07-04 ENCOUNTER — Inpatient Hospital Stay: Payer: Medicare HMO

## 2020-07-04 ENCOUNTER — Ambulatory Visit: Payer: Medicare HMO

## 2020-07-04 VITALS — BP 124/64 | HR 72 | Temp 96.5°F | Resp 18

## 2020-07-04 DIAGNOSIS — K5520 Angiodysplasia of colon without hemorrhage: Secondary | ICD-10-CM

## 2020-07-04 DIAGNOSIS — D500 Iron deficiency anemia secondary to blood loss (chronic): Secondary | ICD-10-CM

## 2020-07-04 DIAGNOSIS — D509 Iron deficiency anemia, unspecified: Secondary | ICD-10-CM | POA: Diagnosis not present

## 2020-07-04 MED ORDER — SODIUM CHLORIDE 0.9 % IV SOLN
Freq: Once | INTRAVENOUS | Status: AC
  Filled 2020-07-04: qty 250

## 2020-07-04 MED ORDER — SODIUM CHLORIDE 0.9 % IV SOLN
300.0000 mg | Freq: Once | INTRAVENOUS | Status: AC
  Administered 2020-07-04: 300 mg via INTRAVENOUS
  Filled 2020-07-04: qty 15

## 2020-07-04 NOTE — Progress Notes (Signed)
Pt received prescribed treatment in clinic, pt stable at d/c.

## 2020-07-11 ENCOUNTER — Inpatient Hospital Stay: Payer: Medicare HMO | Attending: Oncology

## 2020-07-11 ENCOUNTER — Other Ambulatory Visit: Payer: Self-pay

## 2020-07-11 ENCOUNTER — Ambulatory Visit: Payer: Medicare HMO

## 2020-07-11 VITALS — BP 113/66 | HR 76 | Temp 97.0°F | Resp 16

## 2020-07-11 DIAGNOSIS — D509 Iron deficiency anemia, unspecified: Secondary | ICD-10-CM | POA: Diagnosis not present

## 2020-07-11 DIAGNOSIS — D500 Iron deficiency anemia secondary to blood loss (chronic): Secondary | ICD-10-CM

## 2020-07-11 DIAGNOSIS — K5520 Angiodysplasia of colon without hemorrhage: Secondary | ICD-10-CM

## 2020-07-11 MED ORDER — SODIUM CHLORIDE 0.9 % IV SOLN
300.0000 mg | Freq: Once | INTRAVENOUS | Status: AC
  Administered 2020-07-11: 300 mg via INTRAVENOUS
  Filled 2020-07-11: qty 15

## 2020-07-11 MED ORDER — SODIUM CHLORIDE 0.9 % IV SOLN
Freq: Once | INTRAVENOUS | Status: AC
  Filled 2020-07-11: qty 250

## 2020-07-21 ENCOUNTER — Telehealth: Payer: Self-pay

## 2020-07-21 NOTE — Telephone Encounter (Signed)
Patient called and LVM on triage line and wanted to check with Dr. Glori Bickers to see if it would be ok for her to get the 2nd COVID booster shot? Please advise.

## 2020-07-21 NOTE — Telephone Encounter (Signed)
Pt notified of Dr. Marliss Coots comments

## 2020-07-21 NOTE — Telephone Encounter (Signed)
I think it would be a good idea as long as at least 4 mo from last shot (I believe it is)

## 2020-07-22 ENCOUNTER — Other Ambulatory Visit: Payer: Self-pay | Admitting: Family Medicine

## 2020-07-23 NOTE — Telephone Encounter (Signed)
Med hasn't been prescribed since 10/11/2018, please advise

## 2020-07-28 ENCOUNTER — Telehealth: Payer: Self-pay

## 2020-07-28 ENCOUNTER — Other Ambulatory Visit: Payer: Self-pay | Admitting: Gastroenterology

## 2020-07-28 DIAGNOSIS — Z0000 Encounter for general adult medical examination without abnormal findings: Secondary | ICD-10-CM

## 2020-07-28 NOTE — Telephone Encounter (Signed)
Called patient to follow up with her regarding health coaching for smoking cessation after completing program with [email or web address]. Left patient a message to return call to Care Guide at [phone] to discuss her progress towards smoking cessation and the likelihood of engaging in health coaching.

## 2020-08-06 ENCOUNTER — Encounter: Payer: Self-pay | Admitting: Internal Medicine

## 2020-08-06 ENCOUNTER — Inpatient Hospital Stay: Payer: Medicare HMO | Attending: Physician Assistant | Admitting: Internal Medicine

## 2020-08-06 ENCOUNTER — Inpatient Hospital Stay: Payer: Medicare HMO

## 2020-08-06 ENCOUNTER — Other Ambulatory Visit: Payer: Self-pay

## 2020-08-06 VITALS — BP 134/66 | HR 71 | Temp 97.0°F | Resp 17 | Ht 65.0 in | Wt 111.9 lb

## 2020-08-06 DIAGNOSIS — M5030 Other cervical disc degeneration, unspecified cervical region: Secondary | ICD-10-CM | POA: Diagnosis not present

## 2020-08-06 DIAGNOSIS — E119 Type 2 diabetes mellitus without complications: Secondary | ICD-10-CM | POA: Diagnosis not present

## 2020-08-06 DIAGNOSIS — E559 Vitamin D deficiency, unspecified: Secondary | ICD-10-CM | POA: Diagnosis not present

## 2020-08-06 DIAGNOSIS — D500 Iron deficiency anemia secondary to blood loss (chronic): Secondary | ICD-10-CM

## 2020-08-06 DIAGNOSIS — D508 Other iron deficiency anemias: Secondary | ICD-10-CM

## 2020-08-06 DIAGNOSIS — Z7951 Long term (current) use of inhaled steroids: Secondary | ICD-10-CM | POA: Insufficient documentation

## 2020-08-06 DIAGNOSIS — D509 Iron deficiency anemia, unspecified: Secondary | ICD-10-CM | POA: Diagnosis not present

## 2020-08-06 DIAGNOSIS — Z7984 Long term (current) use of oral hypoglycemic drugs: Secondary | ICD-10-CM | POA: Insufficient documentation

## 2020-08-06 DIAGNOSIS — Z79899 Other long term (current) drug therapy: Secondary | ICD-10-CM | POA: Diagnosis not present

## 2020-08-06 DIAGNOSIS — Z853 Personal history of malignant neoplasm of breast: Secondary | ICD-10-CM | POA: Insufficient documentation

## 2020-08-06 LAB — CBC WITH DIFFERENTIAL (CANCER CENTER ONLY)
Abs Immature Granulocytes: 0.13 10*3/uL — ABNORMAL HIGH (ref 0.00–0.07)
Basophils Absolute: 0.1 10*3/uL (ref 0.0–0.1)
Basophils Relative: 1 %
Eosinophils Absolute: 0.1 10*3/uL (ref 0.0–0.5)
Eosinophils Relative: 1 %
HCT: 37.6 % (ref 36.0–46.0)
Hemoglobin: 12 g/dL (ref 12.0–15.0)
Immature Granulocytes: 2 %
Lymphocytes Relative: 23 %
Lymphs Abs: 1.8 10*3/uL (ref 0.7–4.0)
MCH: 28.7 pg (ref 26.0–34.0)
MCHC: 31.9 g/dL (ref 30.0–36.0)
MCV: 90 fL (ref 80.0–100.0)
Monocytes Absolute: 0.6 10*3/uL (ref 0.1–1.0)
Monocytes Relative: 8 %
Neutro Abs: 5.2 10*3/uL (ref 1.7–7.7)
Neutrophils Relative %: 65 %
Platelet Count: 155 10*3/uL (ref 150–400)
RBC: 4.18 MIL/uL (ref 3.87–5.11)
RDW: 20.7 % — ABNORMAL HIGH (ref 11.5–15.5)
WBC Count: 7.8 10*3/uL (ref 4.0–10.5)
nRBC: 0 % (ref 0.0–0.2)

## 2020-08-06 LAB — IRON AND TIBC
Iron: 48 ug/dL (ref 41–142)
Saturation Ratios: 11 % — ABNORMAL LOW (ref 21–57)
TIBC: 420 ug/dL (ref 236–444)
UIBC: 372 ug/dL (ref 120–384)

## 2020-08-06 LAB — FERRITIN: Ferritin: 73 ng/mL (ref 11–307)

## 2020-08-06 NOTE — Progress Notes (Signed)
[facility] [phone]   [phone]  OFFICE PROGRESS NOTE  Tower, [name], MD [place] [address]  DIAGNOSIS: Anemia secondary to GI occult blood loss.   PRIOR THERAPY: Oral iron supplements. Discontinued due to intolerance (severe abdominal pain)  CURRENT THERAPY: IV iron infusions with venofer PRN. Last infusion on 12/14/2019  INTERVAL HISTORY: Christine Barton 70 y.o. female returns to the clinic today for follow-up visit.  The patient is feeling fine today with no concerning complaints except for occasional fatigue.  She denied having any current chest pain, shortness of breath, cough or hemoptysis.  She denied having any fever or chills.  She has no nausea, vomiting, diarrhea or constipation.  She denied having any headache or visual changes.  She has no bleeding, bruises or ecchymosis.  She is here today for evaluation with repeat CBC, iron study and ferritin.   MEDICAL HISTORY: Past Medical History:  Diagnosis Date  . Alcohol abuse, unspecified   . Breast cancer ([place]) 1998   Right  . Cataract    left eye  . Cervical spondylosis 2006   MRI  . Degeneration of cervical intervertebral disc 2006   MRI  . Diabetes mellitus without complication ([place])   . Diverticulosis of colon (without mention of hemorrhage)   . Hyperpotassemia   . Microscopic hematuria   . Mononeuritis of unspecified site   . Nonspecific abnormal results of liver function study   . Other abnormal glucose   . Other and unspecified hyperlipidemia    no per pt  . Other chronic nonalcoholic liver disease   . Personal history of chemotherapy   . Personal history of malignant neoplasm of breast   . Personal history of radiation therapy   . Pneumothorax, acute    right, spontaneous  . Tobacco use disorder   . Unspecified vitamin D deficiency     ALLERGIES:  is allergic to oxycodone and glipizide.  MEDICATIONS:  Current Outpatient  Medications  Medication Sig Dispense Refill  . albuterol (VENTOLIN HFA) 108 (90 Base) MCG/ACT inhaler INHALE 2 PUFFS INTO THE LUNGS EVERY 4 HOURS AS NEEDED FOR WHEEZE OR FOR SHORTNESS OF BREATH 18 each 3  . budesonide-formoterol (SYMBICORT) 160-4.5 MCG/ACT inhaler Inhale 2 puffs into the lungs 2 (two) times daily. 1 each 11  . cholecalciferol (VITAMIN D3) 25 MCG (1000 UNIT) tablet Take 1,000 Units by mouth 2 (two) times daily.    . cyclobenzaprine (FLEXERIL) 10 MG tablet TAKE 0.5 TABLETS (5 MG TOTAL) BY MOUTH AT BEDTIME AS NEEDED FOR MUSCLE SPASMS. 15 tablet 0  . furosemide (LASIX) 20 MG tablet TAKE 1 TABLET BY MOUTH EVERY DAY 90 tablet 1  . lactulose (CHRONULAC) 10 GM/15ML solution Take 15 mLs (10 g total) by mouth 3 (three) times daily. 236 mL 11  . metFORMIN (GLUCOPHAGE-XR) 500 MG 24 hr tablet TAKE 1 TABLET BY MOUTH EVERY DAY WITH BREAKFAST 90 tablet 1  . Multiple Vitamin (MULTIVITAMIN) tablet Take 1 tablet by mouth daily.    . Multiple Vitamins-Minerals (PRESERVISION AREDS 2) CAPS Take 1 capsule by mouth 2 (two) times daily.    . mupirocin ointment (BACTROBAN) 2 % APPLY 1 APPLICATION TOPICALLY 2 (TWO) TIMES DAILY. TO AFFECTED AREAS 22 g 2  . nadolol (CORGARD) 20 MG tablet TAKE 1 TABLET BY MOUTH EVERY DAY 90 tablet 3  . nicotine (NICODERM CQ - DOSED IN MG/24 HOURS) 21 mg/24hr patch Place 1 patch (21 mg total)  onto the skin daily. 42 patch 0  . pantoprazole (PROTONIX) 40 MG tablet TAKE 1 TABLET (40 MG TOTAL) BY MOUTH 2 (TWO) TIMES DAILY BEFORE A MEAL. 180 tablet 1  . Polyethyl Glycol-Propyl Glycol (SYSTANE OP) Place 1 drop into the left eye daily as needed (dryness).    Marland Kitchen spironolactone (ALDACTONE) 25 MG tablet Take 1 tablet (25 mg total) by mouth daily. 30 tablet 1  . SYSTANE COMPLETE 0.6 % SOLN Apply 1 drop to eye 2 (two) times daily.    . temazepam (RESTORIL) 7.5 MG capsule TAKE 1 CAPSULE (7.5 MG TOTAL) BY MOUTH AT BEDTIME AS NEEDED FOR SLEEP. 30 capsule 3  . XIFAXAN 550 MG TABS tablet TAKE 1  TABLET BY MOUTH 2 TIMES DAILY. 60 tablet 3  . clorazepate (TRANXENE) 3.75 MG tablet TAKE 1 TABLET (3.75 MG TOTAL) BY MOUTH 2 (TWO) TIMES DAILY AS NEEDED FOR ANXIETY. (Patient not taking: Reported on 08/06/2020) 60 tablet 2  . nystatin (MYCOSTATIN) 100000 UNIT/ML suspension TAKE 5 MLS (500,000 UNITS TOTAL) BY MOUTH 3 (THREE) TIMES DAILY. SWISH AND SWALLOW (Patient not taking: Reported on 08/06/2020) 120 mL 0  . traMADol (ULTRAM) 50 MG tablet      No current facility-administered medications for this visit.    SURGICAL HISTORY:  Past Surgical History:  Procedure Laterality Date  . BIOPSY  07/27/2019   Procedure: BIOPSY;  Surgeon: Yetta Flock, MD;  Location: [facility];  Service: Gastroenterology;;  . BREAST BIOPSY  9/03   Right  . BREAST LUMPECTOMY Right 1998  . CHEST TUBE INSERTION  11/02/2014  . COLONOSCOPY    . COLONOSCOPY WITH PROPOFOL N/A 07/27/2019   Procedure: COLONOSCOPY WITH PROPOFOL;  Surgeon: Yetta Flock, MD;  Location: [facility];  Service: Gastroenterology;  Laterality: N/A;  . ESOPHAGOGASTRODUODENOSCOPY (EGD) WITH PROPOFOL N/A 10/30/2018   Procedure: ESOPHAGOGASTRODUODENOSCOPY (EGD) WITH PROPOFOL;  Surgeon: Mauri Pole, MD;  Location: [facility];  Service: Endoscopy;  Laterality: N/A;  . ESOPHAGOGASTRODUODENOSCOPY (EGD) WITH PROPOFOL N/A 03/12/2019   Procedure: ESOPHAGOGASTRODUODENOSCOPY (EGD) WITH PROPOFOL;  Surgeon: Mauri Pole, MD;  Location: [facility];  Service: Endoscopy;  Laterality: N/A;  . ESOPHAGOGASTRODUODENOSCOPY (EGD) WITH PROPOFOL N/A 07/27/2019   Procedure: ESOPHAGOGASTRODUODENOSCOPY (EGD) WITH PROPOFOL;  Surgeon: Yetta Flock, MD;  Location: [facility];  Service: Gastroenterology;  Laterality: N/A;  . EYE SURGERY  02/2017   cataract extraction with lens implant-left  . HOT HEMOSTASIS N/A 07/27/2019   Procedure: HOT HEMOSTASIS (ARGON PLASMA COAGULATION/BICAP);  Surgeon: Yetta Flock, MD;  Location: [facility];  Service: Gastroenterology;  Laterality: N/A;  . MOUTH SURGERY    . POLYPECTOMY  07/27/2019   Procedure: POLYPECTOMY;  Surgeon: Yetta Flock, MD;  [facility]: [facility];  Service: Gastroenterology;;  . TUBAL LIGATION      REVIEW OF SYSTEMS:  A comprehensive review of systems was negative except for: Constitutional: positive for fatigue   PHYSICAL EXAMINATION: General appearance: alert, cooperative, fatigued and no distress Head: Normocephalic, without obvious abnormality, atraumatic Neck: no adenopathy, no JVD, supple, symmetrical, trachea midline and thyroid not enlarged, symmetric, no tenderness/mass/nodules Lymph nodes: Cervical, supraclavicular, and axillary nodes normal. Resp: clear to auscultation bilaterally Back: symmetric, no curvature. ROM normal. No CVA tenderness. Cardio: regular rate and rhythm, S1, S2 normal, no murmur, click, rub or gallop GI: soft, non-tender; bowel sounds normal; no masses,  no organomegaly Extremities: extremities normal, atraumatic, no cyanosis or edema  ECOG PERFORMANCE STATUS: 1 - Symptomatic but completely ambulatory  Blood pressure 134/66, pulse  71, temperature (!) 97 F (36.1 C), temperature source Tympanic, resp. rate 17, height 5' 5"  (1.651 m), weight 111 lb 14.4 oz (50.8 kg), SpO2 100 %.  LABORATORY DATA: Lab Results  Component Value Date   WBC 7.8 08/06/2020   HGB 12.0 08/06/2020   HCT 37.6 08/06/2020   MCV 90.0 08/06/2020   PLT 155 08/06/2020      Chemistry      Component Value Date/Time   NA 136 04/16/2020 1136   NA 140 12/11/2019 1617   K 4.3 04/16/2020 1136   CL 100 04/16/2020 1136   CO2 31 04/16/2020 1136   BUN 29 (H) 04/16/2020 1136   BUN 35 (H) 12/11/2019 1617   CREATININE 0.86 04/16/2020 1136      Component Value Date/Time   CALCIUM 9.7 04/16/2020 1136   ALKPHOS 82 04/16/2020 1136   AST 20 04/16/2020 1136   ALT 12 04/16/2020 1136   BILITOT 0.7 04/16/2020 1136   BILITOT 0.7 12/11/2019 1617        RADIOGRAPHIC STUDIES: No results found.  ASSESSMENT AND PLAN: This is a very pleasant 70 years old white female with iron deficiency anemia secondary to likely chronic blood loss secondary to AV malformation as well as lack of dietary iron supplements.  She has intolerance to the oral iron tablets. She was also recently treated with iron infusion with Venofer and tolerated the infusion well. Repeat CBC today showed normal hemoglobin and hematocrit.  Iron study and ferritin are still pending. Depending on the iron study and ferritin, may consider The patient for additional iron infusion but if normal, we will see her back for follow-up visit in 3 months with repeat CBC, iron study and ferritin. She was advised to call immediately if she has any other concerning symptoms in the interval. The patient voices understanding of current disease status and treatment options and is in agreement with the current care plan. All questions were answered. The patient knows to call the clinic with any problems, questions or concerns. We can certainly see the patient much sooner if necessary.   Disclaimer: This note was dictated with voice recognition software. Similar sounding words can inadvertently be transcribed and may not be corrected upon review.

## 2020-08-07 ENCOUNTER — Telehealth: Payer: Self-pay | Admitting: Internal Medicine

## 2020-08-07 NOTE — Telephone Encounter (Signed)
Scheduled per los. Called and left msg. Mailed printout  °

## 2020-08-18 ENCOUNTER — Other Ambulatory Visit: Payer: Self-pay | Admitting: Family Medicine

## 2020-08-18 ENCOUNTER — Other Ambulatory Visit: Payer: Self-pay | Admitting: Gastroenterology

## 2020-08-19 NOTE — Telephone Encounter (Signed)
Pharmacy requests refill on: Cyclobenzaprine 10 mg    LAST REFILL: 07/23/2020 (Q-15, R-0)  LAST OV: 05/12/2020 NEXT OV: 11/12/2020 PHARMACY: [id number] [place], [place]

## 2020-08-25 ENCOUNTER — Encounter: Payer: Self-pay | Admitting: Gastroenterology

## 2020-08-25 ENCOUNTER — Other Ambulatory Visit (INDEPENDENT_AMBULATORY_CARE_PROVIDER_SITE_OTHER): Payer: Medicare HMO

## 2020-08-25 ENCOUNTER — Ambulatory Visit: Payer: Medicare HMO | Admitting: Gastroenterology

## 2020-08-25 VITALS — BP 112/60 | HR 78 | Ht 65.0 in | Wt 113.0 lb

## 2020-08-25 DIAGNOSIS — K7682 Hepatic encephalopathy: Secondary | ICD-10-CM

## 2020-08-25 DIAGNOSIS — D500 Iron deficiency anemia secondary to blood loss (chronic): Secondary | ICD-10-CM

## 2020-08-25 DIAGNOSIS — K7290 Hepatic failure, unspecified without coma: Secondary | ICD-10-CM | POA: Diagnosis not present

## 2020-08-25 DIAGNOSIS — K7460 Unspecified cirrhosis of liver: Secondary | ICD-10-CM | POA: Diagnosis not present

## 2020-08-25 DIAGNOSIS — K5520 Angiodysplasia of colon without hemorrhage: Secondary | ICD-10-CM | POA: Diagnosis not present

## 2020-08-25 LAB — CBC WITH DIFFERENTIAL/PLATELET
Basophils Absolute: 0 10*3/uL (ref 0.0–0.1)
Basophils Relative: 0.4 % (ref 0.0–3.0)
Eosinophils Absolute: 0.1 10*3/uL (ref 0.0–0.7)
Eosinophils Relative: 1.3 % (ref 0.0–5.0)
HCT: 34.2 % — ABNORMAL LOW (ref 36.0–46.0)
Hemoglobin: 11.5 g/dL — ABNORMAL LOW (ref 12.0–15.0)
Lymphocytes Relative: 19.1 % (ref 12.0–46.0)
Lymphs Abs: 1.2 10*3/uL (ref 0.7–4.0)
MCHC: 33.7 g/dL (ref 30.0–36.0)
MCV: 87.4 fl (ref 78.0–100.0)
Monocytes Absolute: 0.5 10*3/uL (ref 0.1–1.0)
Monocytes Relative: 7.8 % (ref 3.0–12.0)
Neutro Abs: 4.5 10*3/uL (ref 1.4–7.7)
Neutrophils Relative %: 71.4 % (ref 43.0–77.0)
Platelets: 162 10*3/uL (ref 150.0–400.0)
RBC: 3.91 Mil/uL (ref 3.87–5.11)
RDW: 21.5 % — ABNORMAL HIGH (ref 11.5–15.5)
WBC: 6.3 10*3/uL (ref 4.0–10.5)

## 2020-08-25 LAB — COMPREHENSIVE METABOLIC PANEL
ALT: 8 U/L (ref 0–35)
AST: 16 U/L (ref 0–37)
Albumin: 4.1 g/dL (ref 3.5–5.2)
Alkaline Phosphatase: 88 U/L (ref 39–117)
BUN: 16 mg/dL (ref 6–23)
CO2: 30 mEq/L (ref 19–32)
Calcium: 9.4 mg/dL (ref 8.4–10.5)
Chloride: 96 mEq/L (ref 96–112)
Creatinine, Ser: 0.75 mg/dL (ref 0.40–1.20)
GFR: 81.07 mL/min (ref >60.00)
Glucose, Bld: 172 mg/dL — ABNORMAL HIGH (ref 70–99)
Potassium: 4 mEq/L (ref 3.5–5.1)
Sodium: 132 mEq/L — ABNORMAL LOW (ref 135–145)
Total Bilirubin: 0.5 mg/dL (ref 0.2–1.2)
Total Protein: 6.8 g/dL (ref 6.0–8.3)

## 2020-08-25 LAB — PROTIME-INR
INR: 1.2 ratio — ABNORMAL HIGH (ref 0.8–1.0)
Prothrombin Time: 13.2 s — ABNORMAL HIGH (ref 9.6–13.1)

## 2020-08-25 NOTE — Patient Instructions (Signed)
Your provider has requested that you go to the basement level for lab work before leaving today. Press "B" on the elevator. The lab is located at the first door on the left as you exit the elevator.  If you are age 70 or older, your body mass index should be between 23-30. Your Body mass index is 18.8 kg/m. If this is out of the aforementioned range listed, please consider follow up with your Primary Care Provider.  If you are age 37 or younger, your body mass index should be between 19-25. Your Body mass index is 18.8 kg/m. If this is out of the aformentioned range listed, please consider follow up with your Primary Care Provider.    Follow up in 4 months  Due to recent changes in healthcare laws, you may see the results of your imaging and laboratory studies on MyChart before your provider has had a chance to review them.  We understand that in some cases there may be results that are confusing or concerning to you. Not all laboratory results come back in the same time frame and the provider may be waiting for multiple results in order to interpret others.  Please give [place] 48 hours in order for your provider to thoroughly review all the results before contacting the office for clarification of your results.   I appreciate the  opportunity to care for you  Thank You   Harl Bowie , MD

## 2020-08-25 NOTE — Progress Notes (Signed)
Christine Barton    [id number]    1950/04/18  Primary Care Physician:Tower, [name], MD  Referring Physician: Tower, [name], MD [name],  [name] [id number]   Chief complaint: Cirrhosis  HPI:  70 year old very pleasant female with history of breast cancer, type 2 diabetes, COPD, chronic anemia secondary to occult GI blood loss and decompensated cirrhosis  Overall doing well.    Symptoms of dizziness last week when she was out of Xifaxan for couple days.  Resolved after she took Xifaxan and lactulose.  She has been having 2-3 soft bowel movements daily  Denies any nausea, vomiting, melena or bright red blood per rectum.  No other episodes of confusion, lower extremity edema or worsening abdominal distention.  Right upper quadrant abdominal pain resolved after she stopped taking statin.  She is receiving IV iron infusion, hemoglobin on saturation.  She is being managed by hematology  Abdominal ultrasound April 30, 2020 1. Cirrhotic liver morphology.  No focal liver lesion is identified. 2. Cholelithiasis with a positive sonographic Murphy sign. Findings raise suspicion for acute cholecystitis.  Normal AFP 3.7 in January 2022 MRI liver 01-08-2020: Stable cirrhosis with no evidence of hepatocellular carcinoma. Choledocholithiasis with stable CBD. Increased iron deposition in the setting of IV iron therapy  EGD July 27, 2019 by Dr. Havery Moros: Small esophageal varices and portal hypertensive gastropathy otherwise normal exam  Colonoscopy July 27, 2019:8small AVMs scattered in the colon ablated with APC and 3 small polyps removed, diverticulosis and internal hemorrhoids  EGD February 23, 2019:Small grade 1 esophageal varices, portal hypertensive gastropathy  Colonoscopy November 30, 2016: Diverticulosis, sessile polyps X4 tubular adenomas and internal hemorrhoids. Recall colonoscopy in 3 years   Outpatient Encounter Medications as of  08/25/2020  Medication Sig  . albuterol (VENTOLIN HFA) 108 (90 Base) MCG/ACT inhaler INHALE 2 PUFFS INTO THE LUNGS EVERY 4 HOURS AS NEEDED FOR WHEEZE OR FOR SHORTNESS OF BREATH  . budesonide-formoterol (SYMBICORT) 160-4.5 MCG/ACT inhaler Inhale 2 puffs into the lungs 2 (two) times daily.  . cholecalciferol (VITAMIN D3) 25 MCG (1000 UNIT) tablet Take 1,000 Units by mouth 2 (two) times daily.  . clorazepate (TRANXENE) 3.75 MG tablet TAKE 1 TABLET (3.75 MG TOTAL) BY MOUTH 2 (TWO) TIMES DAILY AS NEEDED FOR ANXIETY. (Patient not taking: Reported on 08/06/2020)  . cyclobenzaprine (FLEXERIL) 10 MG tablet TAKE 0.5 TABLETS (5 MG TOTAL) BY MOUTH AT BEDTIME AS NEEDED FOR MUSCLE SPASMS.  . furosemide (LASIX) 20 MG tablet TAKE 1 TABLET BY MOUTH EVERY DAY  . lactulose (CHRONULAC) 10 GM/15ML solution Take 15 mLs (10 g total) by mouth 3 (three) times daily.  . metFORMIN (GLUCOPHAGE-XR) 500 MG 24 hr tablet TAKE 1 TABLET BY MOUTH EVERY DAY WITH BREAKFAST  . Multiple Vitamin (MULTIVITAMIN) tablet Take 1 tablet by mouth daily.  . Multiple Vitamins-Minerals (PRESERVISION AREDS 2) CAPS Take 1 capsule by mouth 2 (two) times daily.  . mupirocin ointment (BACTROBAN) 2 % APPLY 1 APPLICATION TOPICALLY 2 (TWO) TIMES DAILY. TO AFFECTED AREAS  . nadolol (CORGARD) 20 MG tablet TAKE 1 TABLET BY MOUTH EVERY DAY  . nicotine (NICODERM CQ - DOSED IN MG/24 HOURS) 21 mg/24hr patch Place 1 patch (21 mg total) onto the skin daily.  Marland Kitchen nystatin (MYCOSTATIN) 100000 UNIT/ML suspension TAKE 5 MLS (500,000 UNITS TOTAL) BY MOUTH 3 (THREE) TIMES DAILY. SWISH AND SWALLOW (Patient not taking: Reported on 08/06/2020)  . pantoprazole (PROTONIX) 40 MG tablet TAKE  1 TABLET (40 MG TOTAL) BY MOUTH 2 (TWO) TIMES DAILY BEFORE A MEAL.  [name] Glycol-Propyl Glycol (SYSTANE OP) Place 1 drop into the left eye daily as needed (dryness).  Marland Kitchen spironolactone (ALDACTONE) 25 MG tablet Take 1 tablet (25 mg total) by mouth daily.  [name] 0.6 % SOLN  Apply 1 drop to eye 2 (two) times daily.  . temazepam (RESTORIL) 7.5 MG capsule TAKE 1 CAPSULE (7.5 MG TOTAL) BY MOUTH AT BEDTIME AS NEEDED FOR SLEEP.  Marland Kitchen traMADol (ULTRAM) 50 MG tablet   . XIFAXAN 550 MG TABS tablet TAKE 1 TABLET BY MOUTH 2 TIMES DAILY.   No facility-administered encounter medications on file as of 08/25/2020.    Allergies as of 08/25/2020 - Review Complete 08/06/2020  Allergen Reaction Noted  . Oxycodone Nausea And Vomiting 11/02/2014  . Glipizide Other (See Comments) 09/12/2013    Past Medical History:  Diagnosis Date  . Alcohol abuse, unspecified   . Breast cancer ([facility]) 1998   Right  . Cataract    left eye  . Cervical spondylosis 2006   MRI  . Degeneration of cervical intervertebral disc 2006   MRI  . Diabetes mellitus without complication ([facility])   . Diverticulosis of colon (without mention of hemorrhage)   . Hyperpotassemia   . Microscopic hematuria   . Mononeuritis of unspecified site   . Nonspecific abnormal results of liver function study   . Other abnormal glucose   . Other and unspecified hyperlipidemia    no per pt  . Other chronic nonalcoholic liver disease   . Personal history of chemotherapy   . Personal history of malignant neoplasm of breast   . Personal history of radiation therapy   . Pneumothorax, acute    right, spontaneous  . Tobacco use disorder   . Unspecified vitamin D deficiency     Past Surgical History:  Procedure Laterality Date  . BIOPSY  07/27/2019   Procedure: BIOPSY;  Surgeon: Yetta Flock, MD;  Location: [facility];  Service: Gastroenterology;;  . BREAST BIOPSY  9/03   Right  . BREAST LUMPECTOMY Right 1998  . CHEST TUBE INSERTION  11/02/2014  . COLONOSCOPY    . COLONOSCOPY WITH PROPOFOL N/A 07/27/2019   Procedure: COLONOSCOPY WITH PROPOFOL;  Surgeon: Yetta Flock, MD;  Location: [facility];  Service: Gastroenterology;  Laterality: N/A;  . ESOPHAGOGASTRODUODENOSCOPY (EGD) WITH PROPOFOL N/A 10/30/2018    Procedure: ESOPHAGOGASTRODUODENOSCOPY (EGD) WITH PROPOFOL;  Surgeon: Mauri Pole, MD;  Location: [facility];  Service: Endoscopy;  Laterality: N/A;  . ESOPHAGOGASTRODUODENOSCOPY (EGD) WITH PROPOFOL N/A 03/12/2019   Procedure: ESOPHAGOGASTRODUODENOSCOPY (EGD) WITH PROPOFOL;  Surgeon: Mauri Pole, MD;  Location: [facility];  Service: Endoscopy;  Laterality: N/A;  . ESOPHAGOGASTRODUODENOSCOPY (EGD) WITH PROPOFOL N/A 07/27/2019   Procedure: ESOPHAGOGASTRODUODENOSCOPY (EGD) WITH PROPOFOL;  Surgeon: Yetta Flock, MD;  Location: [facility];  Service: Gastroenterology;  Laterality: N/A;  . EYE SURGERY  02/2017   cataract extraction with lens implant-left  . HOT HEMOSTASIS N/A 07/27/2019   Procedure: HOT HEMOSTASIS (ARGON PLASMA COAGULATION/BICAP);  Surgeon: Yetta Flock, MD;  Location: [facility];  Service: Gastroenterology;  Laterality: N/A;  . MOUTH SURGERY    . POLYPECTOMY  07/27/2019   Procedure: POLYPECTOMY;  Surgeon: Yetta Flock, MD;  Location: [facility];  Service: Gastroenterology;;  . TUBAL LIGATION      Family History  Problem Relation Age of Onset  . Heart failure Father   . Heart attack Father   .  Colon cancer Maternal Uncle   . Stroke Mother   . Esophageal cancer Neg Hx   . Rectal cancer Neg Hx   . Stomach cancer Neg Hx   . Pancreatic cancer Neg Hx     Social History   Socioeconomic History  . Marital status: Single    Spouse name: Not on file  . Number of children: 1  . Years of education: Not on file  . Highest education level: Not on file  Occupational History  . Occupation: retired    Fish farm manager: [employer]  Tobacco Use  . Smoking status: Current Every Day Smoker    Packs/day: 1.00    Years: 53.00    Pack years: 53.00    Types: Cigarettes  . Smokeless tobacco: Never Used  . Tobacco comment: Patient is engaged in health coaching for smoking cessation   Vaping Use  . Vaping Use: Never used  Substance and  Sexual Activity  . Alcohol use: Not Currently  . Drug use: No  . Sexual activity: Not Currently  Other Topics Concern  . Not on file  Social History Narrative   Divorced      1 child      Works at [employer] Strain: Joiner   . Difficulty of Paying Living Expenses: Not hard at all  Food Insecurity: No Food Insecurity  . Worried About Charity fundraiser in the Last Year: Never true  . Ran Out of Food in the Last Year: Never true  Transportation Needs: No Transportation Needs  . Lack of Transportation (Medical): No  . Lack of Transportation (Non-Medical): No  Physical Activity: Insufficiently Active  . Days of Exercise per Week: 7 days  . Minutes of Exercise per Session: 10 min  Stress: Stress Concern Present  . Feeling of Stress : To some extent  Social Connections: Not on file  Intimate Partner Violence: Not At Risk  . Fear of Current or Ex-Partner: No  . Emotionally Abused: No  . Physically Abused: No  . Sexually Abused: No      Review of systems: All other review of systems negative except as mentioned in the HPI.   Physical Exam: There were no vitals filed for this visit. There is no height or weight on file to calculate BMI. Gen:      No acute distress HEENT:  sclera anicteric Abd:      soft, non-tender; no palpable masses, no distension Ext:    No edema Neuro: alert and oriented x 3 Psych: normal mood and affect  Data Reviewed:  Reviewed labs, radiology imaging, old records and pertinent past GI work up   Assessment and Plan/Recommendations:  70 year old very pleasant female with history of NASH and EtOH cirrhosis decompensated by ascites and hepatic encephalopathy  Low meld 6 based on prior labs. Recheck CBC, CMP, PT and INR  Iron deficiency anemia: Improved with IV iron infusion Chronic GI blood loss secondary to small bowel AVMs Continue to monitor CBC, iron panel and IV  iron infusion as needed.  She is being managed by hematology  Hepatic encephalopathy: Stable Continue Xifaxan and advised patient to take lactulose as needed if she has change in mood or gait.  Also advised her to take lactulose if she has less than 2 or 3 soft bowel movements per day  No evidence of volume overload or ascites on exam, continue low-dose diuretics  [place] screening: Due for right upper quadrant ultrasound, will schedule it Right upper quadrant discomfort, unclear etiology.  No significant normality on previous imaging or endoscopic evaluation  Right upper quadrant abdominal pain resolved after stopping statin.  Advised patient to discuss with Dr. Glori Bickers management of hyperlipidemia. She has cholelithiasis, no plan for cholecystectomy given potential complications associated with surgery due to her comorbid conditions and cirrhosis  Return in 3-4 months or sooner if needed   The patient was provided an opportunity to ask questions and all were answered. The patient agreed with the plan and demonstrated an understanding of the instructions.  Damaris Hippo , MD    CC: Tower, [name], MD

## 2020-09-06 ENCOUNTER — Other Ambulatory Visit: Payer: Self-pay | Admitting: Adult Health

## 2020-09-06 DIAGNOSIS — F411 Generalized anxiety disorder: Secondary | ICD-10-CM

## 2020-09-14 ENCOUNTER — Other Ambulatory Visit: Payer: Self-pay | Admitting: Gastroenterology

## 2020-09-14 ENCOUNTER — Other Ambulatory Visit: Payer: Self-pay | Admitting: Family Medicine

## 2020-09-14 ENCOUNTER — Other Ambulatory Visit: Payer: Self-pay | Admitting: Adult Health

## 2020-09-14 DIAGNOSIS — F411 Generalized anxiety disorder: Secondary | ICD-10-CM

## 2020-09-16 ENCOUNTER — Other Ambulatory Visit: Payer: Self-pay | Admitting: Adult Health

## 2020-09-16 ENCOUNTER — Telehealth: Payer: Self-pay | Admitting: Adult Health

## 2020-09-16 NOTE — Telephone Encounter (Signed)
Christine Barton's last appt was 01/28/20 and the note said she was to return in one month. Christine Barton told me that on her last visit Christine Barton told her that she did not need to come back in for another visit being the drive that she has to make from [place]. I told her we'd check with Christine Barton on the return visit and let her know. She needs a refill on her Clorazepate called to:  [id number] [place] [place]  [phone] Fax:  [phone]

## 2020-09-16 NOTE — Telephone Encounter (Signed)
Last filled 08-19-20 #15 Last OV 05-12-20 Next OV 11-12-20 [facility]

## 2020-09-16 NOTE — Telephone Encounter (Signed)
Christine Barton said to please schedule a phone visit with her

## 2020-09-16 NOTE — Telephone Encounter (Signed)
Please review

## 2020-09-16 NOTE — Telephone Encounter (Signed)
Phone visit scheduled for 09/18/20.

## 2020-09-16 NOTE — Telephone Encounter (Signed)
She is asking for clorazepate refill also,I don't see it in the med list but it is in the note

## 2020-09-17 NOTE — Telephone Encounter (Signed)
Noted. Will discuss at appointment

## 2020-09-18 ENCOUNTER — Telehealth (INDEPENDENT_AMBULATORY_CARE_PROVIDER_SITE_OTHER): Payer: Medicare HMO | Admitting: Adult Health

## 2020-09-18 ENCOUNTER — Encounter: Payer: Self-pay | Admitting: Adult Health

## 2020-09-18 DIAGNOSIS — G4700 Insomnia, unspecified: Secondary | ICD-10-CM | POA: Diagnosis not present

## 2020-09-18 DIAGNOSIS — F411 Generalized anxiety disorder: Secondary | ICD-10-CM

## 2020-09-18 MED ORDER — CLORAZEPATE DIPOTASSIUM 3.75 MG PO TABS: 3.7500 mg | ORAL_TABLET | Freq: Two times a day (BID) | ORAL | 5 refills | Status: DC

## 2020-09-18 NOTE — Progress Notes (Signed)
Christine Barton [id number] 09-Jul-1950 70 y.o.  Virtual Visit via Telephone Note  I connected with pt on 09/18/20 at  4:20 PM EDT by telephone and verified that I am speaking with the correct person using two identifiers.   I discussed the limitations, risks, security and privacy concerns of performing an evaluation and management service by telephone and the availability of in person appointments. I also discussed with the patient that there may be a patient responsible charge related to this service. The patient expressed understanding and agreed to proceed.   I discussed the assessment and treatment plan with the patient. The patient was provided an opportunity to ask questions and all were answered. The patient agreed with the plan and demonstrated an understanding of the instructions.   The patient was advised to call back or seek an in-person evaluation if the symptoms worsen or if the condition fails to improve as anticipated.  I provided 10 minutes of non-face-to-face time during this encounter.  The patient was located at home.  The provider was located at [place].   Aloha Gell, NP   Subjective:   Patient ID:  Christine Barton is a 70 y.o. (DOB 1951-02-10) female.  Chief Complaint: No chief complaint on file.   HPI Christine Barton presents for follow-up of GAD and insomnia.   Describes mood today as "ok". Pleasant. Mood symptoms - reports some depression, anxiety, and irritability at times.  Denies nightmares and panic attacks. Stating "I'm hanging in there". Feels like Tranxene continues to work well for her. Has stopped Ambien - no longer needed. 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 Declining health with liver disease. Stable interest and motivation. Taking medications as prescribed.  Energy levels stable. Active, does not have a regular exercise routine. Walking when she can. Enjoys some  usual interests and activities. Lives alone with cats. Spending time with family. Talking to chaplain. Appetite adequate. Weight stable - "maintaining". Sleeps well for the most part. Averages 7 to 8 hours. Focus and concentration pretty good. Completing tasks. Managing aspects of household. Retired from [employer]. Denies SI or HI.  Denies AH or VH.    Previous medication trials: Denies   Review of Systems:  Review of Systems  Musculoskeletal:  Negative for gait problem.  Neurological:  Negative for tremors.  Psychiatric/Behavioral:         Please refer to HPI   Medications: I have reviewed the patient's current medications.  Current Outpatient Medications  Medication Sig Dispense Refill   clorazepate (TRANXENE) 3.75 MG tablet Take 1 tablet (3.75 mg total) by mouth 2 (two) times daily. 60 tablet 5   albuterol (VENTOLIN HFA) 108 (90 Base) MCG/ACT inhaler INHALE 2 PUFFS INTO THE LUNGS EVERY 4 HOURS AS NEEDED FOR WHEEZE OR FOR SHORTNESS OF BREATH 18 each 3   budesonide-formoterol (SYMBICORT) 160-4.5 MCG/ACT inhaler Inhale 2 puffs into the lungs 2 (two) times daily. 1 each 11   cholecalciferol (VITAMIN D3) 25 MCG (1000 UNIT) tablet Take 1,000 Units by mouth 2 (two) times daily.     cyclobenzaprine (FLEXERIL) 10 MG tablet TAKE 1/2 TABLET BY MOUTH AT BEDTIME AS NEEDED FOR MUSCLE SPASMS 15 tablet 3   furosemide (LASIX) 20 MG tablet TAKE 1 TABLET BY MOUTH EVERY DAY 90 tablet 1   lactulose (CHRONULAC) 10 GM/15ML solution Take 15 mLs (10 g total) by mouth 3 (three) times daily. [place]  mL 11   metFORMIN (GLUCOPHAGE-XR) 500 MG 24 hr tablet TAKE 1 TABLET BY MOUTH EVERY DAY WITH BREAKFAST 90 tablet 1   Multiple Vitamin (MULTIVITAMIN) tablet Take 1 tablet by mouth daily.     Multiple Vitamins-Minerals (PRESERVISION AREDS 2) CAPS Take 1 capsule by mouth 2 (two) times daily.     mupirocin ointment (BACTROBAN) 2 % APPLY 1 APPLICATION TOPICALLY 2 (TWO) TIMES DAILY. TO AFFECTED AREAS 22 g 2    nadolol (CORGARD) 20 MG tablet TAKE 1 TABLET BY MOUTH EVERY DAY 90 tablet 3   nicotine (NICODERM CQ - DOSED IN MG/24 HOURS) 21 mg/24hr patch Place 1 patch (21 mg total) onto the skin daily. 42 patch 0   pantoprazole (PROTONIX) 40 MG tablet TAKE 1 TABLET (40 MG TOTAL) BY MOUTH 2 (TWO) TIMES DAILY BEFORE A MEAL. 180 tablet 1   Polyethyl Glycol-Propyl Glycol (SYSTANE OP) Place 1 drop into the left eye daily as needed (dryness).     spironolactone (ALDACTONE) 25 MG tablet Take 1 tablet (25 mg total) by mouth daily. 30 tablet 1   SYSTANE COMPLETE 0.6 % SOLN Apply 1 drop to eye 2 (two) times daily.     temazepam (RESTORIL) 7.5 MG capsule TAKE 1 CAPSULE (7.5 MG TOTAL) BY MOUTH AT BEDTIME AS NEEDED FOR SLEEP. 30 capsule 3   traMADol (ULTRAM) 50 MG tablet      XIFAXAN 550 MG TABS tablet TAKE 1 TABLET BY MOUTH 2 TIMES DAILY. 60 tablet 3   No current facility-administered medications for this visit.    Medication Side Effects: None  Allergies:  Allergies  Allergen Reactions   Oxycodone Nausea And Vomiting   Glipizide Other (See Comments)    Stomach pain    Past Medical History:  Diagnosis Date   Alcohol abuse, unspecified    Breast cancer ([place]) 1998   Right   Cataract    left eye   Cervical spondylosis 2006   MRI   Degeneration of cervical intervertebral disc 2006   MRI   Diabetes mellitus without complication ([facility])    Diverticulosis of colon (without mention of hemorrhage)    Hyperpotassemia    Microscopic hematuria    Mononeuritis of unspecified site    Nonspecific abnormal results of liver function study    Other abnormal glucose    Other and unspecified hyperlipidemia    no per pt   Other chronic nonalcoholic liver disease    Personal history of chemotherapy    Personal history of malignant neoplasm of breast    Personal history of radiation therapy    Pneumothorax, acute    right, spontaneous   Tobacco use disorder    Unspecified vitamin D deficiency     Family History   Problem Relation Age of Onset   Heart failure Father    Heart attack Father    Colon cancer Maternal Uncle    Stroke Mother    Esophageal cancer Neg Hx    Rectal cancer Neg Hx    Stomach cancer Neg Hx    Pancreatic cancer Neg Hx     Social History   Socioeconomic History   Marital status: Single    Spouse name: Not on file   Number of children: 1   Years of education: Not on file   Highest education level: Not on file  Occupational History   Occupation: retired    Fish farm manager: [employer]  Tobacco Use   Smoking status: Every Day    Packs/day: 1.00  Years: 53.00    Pack years: 53.00    Types: Cigarettes   Smokeless tobacco: Never   Tobacco comments:    Patient is engaged in health coaching for smoking cessation   Vaping Use   Vaping Use: Never used  Substance and Sexual Activity   Alcohol use: Not Currently   Drug use: No   Sexual activity: Not Currently  Other Topics Concern   Not on file  Social History Narrative   Divorced      1 child      Works at [employer] Strain: Low Risk    Difficulty of Paying Living Expenses: Not hard at all  Food Insecurity: No Food Insecurity   Worried About Charity fundraiser in the Last Year: Never true   Arboriculturist in the Last Year: Never true  Transportation Needs: No Transportation Needs   Lack of Transportation (Medical): No   Lack of Transportation (Non-Medical): No  Physical Activity: Insufficiently Active   Days of Exercise per Week: 7 days   Minutes of Exercise per Session: 10 min  Stress: Stress Concern Present   Feeling of Stress : To some extent  Social Connections: Not on file  Intimate Partner Violence: Not At Risk   Fear of Current or Ex-Partner: No   Emotionally Abused: No   Physically Abused: No   Sexually Abused: No    Past Medical History, Surgical history, Social history, and Family history were reviewed and updated as  appropriate.   Please see review of systems for further details on the patient's review from today.   Objective:   Physical Exam:  LMP  (LMP Unknown)   Physical Exam Neurological:     Mental Status: She is alert and oriented to person, place, and time.     Cranial Nerves: No dysarthria.  Psychiatric:        Attention and Perception: Attention and perception normal.        Mood and Affect: Mood normal.        Speech: Speech normal.        Behavior: Behavior is cooperative.        Thought Content: Thought content normal. Thought content is not paranoid or delusional. Thought content does not include homicidal or suicidal ideation. Thought content does not include homicidal or suicidal plan.        Cognition and Memory: Cognition and memory normal.        Judgment: Judgment normal.     Comments: Insight intact    Lab Review:     Component Value Date/Time   NA 132 (L) 08/25/2020 0959   NA 140 12/11/2019 1617   K 4.0 08/25/2020 0959   CL 96 08/25/2020 0959   CO2 30 08/25/2020 0959   GLUCOSE 172 (H) 08/25/2020 0959   BUN 16 08/25/2020 0959   BUN 35 (H) 12/11/2019 1617   CREATININE 0.75 08/25/2020 0959   CALCIUM 9.4 08/25/2020 0959   PROT 6.8 08/25/2020 0959   PROT 7.7 12/11/2019 1617   ALBUMIN 4.1 08/25/2020 0959   ALBUMIN 5.0 (H) 12/11/2019 1617   AST 16 08/25/2020 0959   ALT 8 08/25/2020 0959   ALKPHOS 88 08/25/2020 0959   BILITOT 0.5 08/25/2020 0959   BILITOT 0.7 12/11/2019 1617   GFRNONAA 72 12/11/2019 1617   GFRAA 83 12/11/2019 1617       Component Value Date/Time  WBC 6.3 08/25/2020 0959   RBC 3.91 08/25/2020 0959   HGB 11.5 (L) 08/25/2020 0959   HGB 12.0 08/06/2020 1439   HGB 12.4 12/11/2019 1617   HCT 34.2 (L) 08/25/2020 0959   HCT 39.3 12/11/2019 1617   PLT 162.0 08/25/2020 0959   PLT 155 08/06/2020 1439   PLT 120 (L) 12/11/2019 1617   MCV 87.4 08/25/2020 0959   MCV 86 12/11/2019 1617   MCH 28.7 08/06/2020 1439   MCHC 33.7 08/25/2020 0959   RDW  21.5 (H) 08/25/2020 0959   RDW 17.0 (H) 12/11/2019 1617   LYMPHSABS 1.2 08/25/2020 0959   MONOABS 0.5 08/25/2020 0959   EOSABS 0.1 08/25/2020 0959   BASOSABS 0.0 08/25/2020 0959    No results found for: POCLITH, LITHIUM   No results found for: PHENYTOIN, PHENOBARB, VALPROATE, CBMZ   .res Assessment: Plan:    Plan:  PDMP reviewed  1. Tranxene 3.75 BID prn anxiety  RTC 6 months  Patient advised to contact office with any questions, adverse effects, or acute worsening in signs and symptoms.  Discussed potential benefits, risk, and side effects of benzodiazepines to include potential risk of tolerance and dependence, as well as possible drowsiness.  Advised patient not to drive if experiencing drowsiness and to take lowest possible effective dose to minimize risk of dependence and tolerance.    Diagnoses and all orders for this visit:  Generalized anxiety disorder  Insomnia, unspecified type  Other orders -     clorazepate (TRANXENE) 3.75 MG tablet; Take 1 tablet (3.75 mg total) by mouth 2 (two) times daily.   Please see After Visit Summary for patient specific instructions.  Future Appointments  Date Time Provider [place]  11/05/2020  2:45 PM [facility] None  11/05/2020  3:15 PM Curt Bears, MD [facility] None  11/07/2020  8:30 AM [facility]  11/07/2020 10:30 AM [facility] NURSE HEALTH ADVISOR [facility]  11/12/2020 12:00 PM Tower, [name], MD [facility]    No orders of the defined types were placed in this encounter.     -------------------------------

## 2020-10-01 ENCOUNTER — Ambulatory Visit: Payer: Medicare HMO | Admitting: Primary Care

## 2020-10-11 ENCOUNTER — Other Ambulatory Visit: Payer: Self-pay | Admitting: Gastroenterology

## 2020-10-21 ENCOUNTER — Telehealth: Payer: Self-pay | Admitting: Internal Medicine

## 2020-10-21 NOTE — Telephone Encounter (Signed)
R/s 7/27 appt due to provider on call schedule. Called and spoke with patient. Confirmed appt

## 2020-10-24 ENCOUNTER — Other Ambulatory Visit: Payer: Self-pay | Admitting: Family Medicine

## 2020-10-30 NOTE — Progress Notes (Signed)
[facility] OFFICE PROGRESS NOTE  Tower, Christine Fanny, MD [place] [id number]  DIAGNOSIS: Anemia secondary to GI occult blood loss.   PRIOR THERAPY: Oral iron supplements. Discontinued due to intolerance (severe abdominal pain)  CURRENT THERAPY: IV iron infusions with venofer PRN. Last infusion on 07/11/20  INTERVAL HISTORY: Christine Barton 70 y.o. female returns to the clinic today for a follow-up visit.  The patient is feeling well today without any concerning complaints.  She states that she has been feeling well lately and she has been very active with fostering cats/kittens.  Her last iron infusion was on 07/11/20. The patient does not take oral iron supplements due to it causing severe abdominal pain per patient report.  The patient is followed closely by her gastroenterologist secondary to her cirrhosis.  Her next appointment with them is reportedly on 01/13/21 the patient's iron deficiency anemia is likely secondary to some esophageal varices as well as AVMs. She states that she has been told she bleeds chronically but she has not noticed any visible blood lately. She denies any other abnormal bleeding or bruising to her knowledge including epistaxis, gingival bleeding, hematemesis, hemoptysis, melena, hematochezia, or hematuria.   Today, the patient reports to feeling well.  She denies any noticeable worsening fatigue.  She denies any chest pain.  She reports her baseline dyspnea on exertion which she attributes to smoking cigarettes/lung disease.  Her main complaint is related to decreased appetite and weight loss.  She is planning on following up with her primary care provider next week to discuss this.  The patient is here today for evaluation and repeat CBC, ferritin, and iron studies.  MEDICAL HISTORY: Past Medical History:  Diagnosis Date   Alcohol abuse, unspecified    Breast cancer ([facility]) 1998   Right   Cataract    left eye   Cervical spondylosis  2006   MRI   Degeneration of cervical intervertebral disc 2006   MRI   Diabetes mellitus without complication (HCC)    Diverticulosis of colon (without mention of hemorrhage)    Hyperpotassemia    Microscopic hematuria    Mononeuritis of unspecified site    Nonspecific abnormal results of liver function study    Other abnormal glucose    Other and unspecified hyperlipidemia    no per pt   Other chronic nonalcoholic liver disease    Personal history of chemotherapy    Personal history of malignant neoplasm of breast    Personal history of radiation therapy    Pneumothorax, acute    right, spontaneous   Tobacco use disorder    Unspecified vitamin D deficiency     ALLERGIES:  is allergic to oxycodone and glipizide.  MEDICATIONS:  Current Outpatient Medications  Medication Sig Dispense Refill   albuterol (VENTOLIN HFA) 108 (90 Base) MCG/ACT inhaler INHALE 2 PUFFS INTO THE LUNGS EVERY 4 HOURS AS NEEDED FOR WHEEZE OR FOR SHORTNESS OF BREATH 18 each 3   budesonide-formoterol (SYMBICORT) 160-4.5 MCG/ACT inhaler Inhale 2 puffs into the lungs 2 (two) times daily. 1 each 11   cholecalciferol (VITAMIN D3) 25 MCG (1000 UNIT) tablet Take 1,000 Units by mouth 2 (two) times daily.     clorazepate (TRANXENE) 3.75 MG tablet Take 1 tablet (3.75 mg total) by mouth 2 (two) times daily. 60 tablet 5   cyclobenzaprine (FLEXERIL) 10 MG tablet TAKE 1/2 TABLET BY MOUTH AT BEDTIME AS NEEDED FOR MUSCLE SPASMS 15 tablet 3   furosemide (  LASIX) 20 MG tablet TAKE 1 TABLET BY MOUTH EVERY DAY 90 tablet 1   lactulose (CHRONULAC) 10 GM/15ML solution Take 15 mLs (10 g total) by mouth 3 (three) times daily. 236 mL 11   metFORMIN (GLUCOPHAGE-XR) 500 MG 24 hr tablet TAKE 1 TABLET BY MOUTH EVERY DAY WITH BREAKFAST 90 tablet 0   Multiple Vitamin (MULTIVITAMIN) tablet Take 1 tablet by mouth daily.     Multiple Vitamins-Minerals (PRESERVISION AREDS 2) CAPS Take 1 capsule by mouth 2 (two) times daily.     mupirocin ointment  (BACTROBAN) 2 % APPLY 1 APPLICATION TOPICALLY 2 (TWO) TIMES DAILY. TO AFFECTED AREAS 22 g 2   nadolol (CORGARD) 20 MG tablet TAKE 1 TABLET BY MOUTH EVERY DAY 90 tablet 3   nicotine (NICODERM CQ - DOSED IN MG/24 HOURS) 21 mg/24hr patch Place 1 patch (21 mg total) onto the skin daily. 42 patch 0   pantoprazole (PROTONIX) 40 MG tablet TAKE 1 TABLET (40 MG TOTAL) BY MOUTH 2 (TWO) TIMES DAILY BEFORE A MEAL. 180 tablet 1   Polyethyl Glycol-Propyl Glycol (SYSTANE OP) Place 1 drop into the left eye daily as needed (dryness).     spironolactone (ALDACTONE) 25 MG tablet Take 1 tablet (25 mg total) by mouth daily. 30 tablet 1   SYSTANE COMPLETE 0.6 % SOLN Apply 1 drop to eye 2 (two) times daily.     temazepam (RESTORIL) 7.5 MG capsule TAKE 1 CAPSULE (7.5 MG TOTAL) BY MOUTH AT BEDTIME AS NEEDED FOR SLEEP. 30 capsule 3   traMADol (ULTRAM) 50 MG tablet      XIFAXAN 550 MG TABS tablet TAKE 1 TABLET BY MOUTH TWICE A DAY 60 tablet 3   No current facility-administered medications for this visit.    SURGICAL HISTORY:  Past Surgical History:  Procedure Laterality Date   BIOPSY  07/27/2019   Procedure: BIOPSY;  Surgeon: Yetta Flock, MD;  Location: [facility];  Service: Gastroenterology;;   BREAST BIOPSY  9/03   Right   BREAST LUMPECTOMY Right 1998   CHEST TUBE INSERTION  11/02/2014   COLONOSCOPY     COLONOSCOPY WITH PROPOFOL N/A 07/27/2019   Procedure: COLONOSCOPY WITH PROPOFOL;  Surgeon: Yetta Flock, MD;  Location: [facility];  Service: Gastroenterology;  Laterality: N/A;   ESOPHAGOGASTRODUODENOSCOPY (EGD) WITH PROPOFOL N/A 10/30/2018   Procedure: ESOPHAGOGASTRODUODENOSCOPY (EGD) WITH PROPOFOL;  Surgeon: Mauri Pole, MD;  Location: [facility];  Service: Endoscopy;  Laterality: N/A;   ESOPHAGOGASTRODUODENOSCOPY (EGD) WITH PROPOFOL N/A 03/12/2019   Procedure: ESOPHAGOGASTRODUODENOSCOPY (EGD) WITH PROPOFOL;  Surgeon: Mauri Pole, MD;  Location: [facility];  Service:  Endoscopy;  Laterality: N/A;   ESOPHAGOGASTRODUODENOSCOPY (EGD) WITH PROPOFOL N/A 07/27/2019   Procedure: ESOPHAGOGASTRODUODENOSCOPY (EGD) WITH PROPOFOL;  Surgeon: Yetta Flock, MD;  Location: [facility];  Service: Gastroenterology;  Laterality: N/A;   EYE SURGERY  02/2017   cataract extraction with lens implant-left   HOT HEMOSTASIS N/A 07/27/2019   Procedure: HOT HEMOSTASIS (ARGON PLASMA COAGULATION/BICAP);  Surgeon: Yetta Flock, MD;  Location: [facility];  Service: Gastroenterology;  Laterality: N/A;   MOUTH SURGERY     POLYPECTOMY  07/27/2019   Procedure: POLYPECTOMY;  Surgeon: Yetta Flock, MD;  Location: [facility];  Service: Gastroenterology;;   TUBAL LIGATION      REVIEW OF SYSTEMS:   Review of Systems Constitutional: Positive for stable fatigue.  Positive for decreased appetite and weight loss.  Negative for appetite change, chills, fever and unexpected weight change. HENT: Negative for mouth sores,  nosebleeds, sore throat and trouble swallowing.   Eyes: Negative for eye problems and icterus. Respiratory: Positive for baseline shortness of breath. Negative for cough, hemoptysis, and wheezing.   Cardiovascular: Negative for chest pain and leg swelling. Gastrointestinal: Negative for abdominal pain, constipation, diarrhea, nausea and vomiting. Genitourinary: Negative for bladder incontinence, difficulty urinating, dysuria, frequency and hematuria.   Musculoskeletal: Negative for back pain, gait problem, neck pain and neck stiffness. Skin: Negative for itching and rash. Neurological: Negative for dizziness, extremity weakness, gait problem, headaches, and seizures. Hematological: Negative for adenopathy. Does not bruise/bleed easily. Psychiatric/Behavioral: Negative for confusion, depression and sleep disturbance. The patient is not nervous/anxious.    PHYSICAL EXAMINATION:  Blood pressure (!) 124/55, pulse 75, temperature 98 F (36.7 C), temperature  source Oral, resp. rate 17, weight 107 lb 11.2 oz (48.9 kg), SpO2 100 %.  ECOG PERFORMANCE STATUS: 1 - Symptomatic but completely ambulatory  Physical Exam  Constitutional: Oriented to person, place, and time and thin-appearing female and in no distress. HENT: Head: Normocephalic and atraumatic. Mouth/Throat: Oropharynx is clear and moist. No oropharyngeal exudate. Eyes: Conjunctivae are normal. Right eye exhibits no discharge. Left eye exhibits no discharge. No scleral icterus. Neck: Normal range of motion. Neck supple. Cardiovascular: Normal rate, regular rhythm, normal heart sounds and intact distal pulses.   Pulmonary/Chest: Effort normal and breath sounds normal. No respiratory distress. No wheezes. No rales. Abdominal: Soft. Bowel sounds are normal. Exhibits no distension and no mass. There is no tenderness.  Musculoskeletal: Normal range of motion. Exhibits no edema.  Lymphadenopathy:    No cervical adenopathy.  Neurological: Alert and oriented to person, place, and time. Exhibits normal muscle tone. Gait normal. Coordination normal. Skin: Skin is warm and dry. No rash noted. Not diaphoretic. No erythema. No pallor.  Psychiatric: Mood, memory and judgment normal. Vitals reviewed.  LABORATORY DATA: Lab Results  Component Value Date   WBC 8.6 11/06/2020   HGB 8.5 (L) 11/06/2020   HCT 28.5 (L) 11/06/2020   MCV 81.2 11/06/2020   PLT 224 11/06/2020      Chemistry      Component Value Date/Time   NA 132 (L) 08/25/2020 0959   NA 140 12/11/2019 1617   K 4.0 08/25/2020 0959   CL 96 08/25/2020 0959   CO2 30 08/25/2020 0959   BUN 16 08/25/2020 0959   BUN 35 (H) 12/11/2019 1617   CREATININE 0.75 08/25/2020 0959      Component Value Date/Time   CALCIUM 9.4 08/25/2020 0959   ALKPHOS 88 08/25/2020 0959   AST 16 08/25/2020 0959   ALT 8 08/25/2020 0959   BILITOT 0.5 08/25/2020 0959   BILITOT 0.7 12/11/2019 1617       RADIOGRAPHIC STUDIES:  No results  found.   ASSESSMENT/PLAN:  This is a very pleasant 70 year old Caucasian female with iron deficiency anemia likely secondary to occult GI blood loss.   The patient had a repeat CBC, iron studies, and ferritin performed today.  The patient's CBC demonstrated a Hbg of 8.5 compared to 11.5 2 months ago. Her MCV is within normal limits on the low end of normal.  The patient's iron studies and ferritin are still pending at this time.   I will reach out to the patient once I have the results of her ferritin and iron studies but I suspect she will need iron infusions. I will arrange for more IV iron infusions with Venofer 300 mg weekly x3 at [facility] due to the proximity to her house.  We will plan on seeing the patient back for follow-up visit in 6 weeks for evaluation and repeat CBC, iron studies, and ferritin.    She will continue to follow with her gastroenterologist regarding her cirrhosis. I will reach out to her gastroenterologist to see if she needs to be evaluated sooner from their standpoint.    Since she is unable to tolerate oral iron supplements, I encouraged her to eat an iron rich diet but this is challenging for her given her decreased appetite and weight loss.  The patient was advised to call immediately if she has any concerning symptoms in the interval. The patient voices understanding of current disease status and treatment options and is in agreement with the current care plan. All questions were answered. The patient knows to call the clinic with any problems, questions or concerns. We can certainly see the patient much sooner if necessary   Orders Placed This Encounter  Procedures   CBC with Differential ([facility] Only)    Standing Status:   Future    Standing Expiration Date:   11/06/2021   Ferritin    Standing Status:   Future    Standing Expiration Date:   11/06/2021   Iron and TIBC    Standing Status:   Future    Standing Expiration Date:   11/06/2021     The  total time spent in the appointment was 20-29 minutes   Jayson Waterhouse L Janay Canan, PA-C 11/06/20

## 2020-11-05 ENCOUNTER — Ambulatory Visit: Payer: Medicare HMO | Admitting: Internal Medicine

## 2020-11-05 ENCOUNTER — Other Ambulatory Visit: Payer: Medicare HMO

## 2020-11-06 ENCOUNTER — Other Ambulatory Visit: Payer: Self-pay

## 2020-11-06 ENCOUNTER — Inpatient Hospital Stay: Payer: Medicare HMO | Attending: Physician Assistant

## 2020-11-06 ENCOUNTER — Telehealth: Payer: Self-pay | Admitting: Family Medicine

## 2020-11-06 ENCOUNTER — Inpatient Hospital Stay: Payer: Medicare HMO | Admitting: Physician Assistant

## 2020-11-06 VITALS — BP 124/55 | HR 75 | Temp 98.0°F | Resp 17 | Wt 107.7 lb

## 2020-11-06 DIAGNOSIS — K625 Hemorrhage of anus and rectum: Secondary | ICD-10-CM | POA: Insufficient documentation

## 2020-11-06 DIAGNOSIS — Z7984 Long term (current) use of oral hypoglycemic drugs: Secondary | ICD-10-CM | POA: Insufficient documentation

## 2020-11-06 DIAGNOSIS — Z853 Personal history of malignant neoplasm of breast: Secondary | ICD-10-CM | POA: Diagnosis not present

## 2020-11-06 DIAGNOSIS — E1136 Type 2 diabetes mellitus with diabetic cataract: Secondary | ICD-10-CM | POA: Insufficient documentation

## 2020-11-06 DIAGNOSIS — D509 Iron deficiency anemia, unspecified: Secondary | ICD-10-CM | POA: Diagnosis not present

## 2020-11-06 DIAGNOSIS — K7030 Alcoholic cirrhosis of liver without ascites: Secondary | ICD-10-CM

## 2020-11-06 DIAGNOSIS — Z0000 Encounter for general adult medical examination without abnormal findings: Secondary | ICD-10-CM

## 2020-11-06 DIAGNOSIS — E785 Hyperlipidemia, unspecified: Secondary | ICD-10-CM | POA: Diagnosis not present

## 2020-11-06 DIAGNOSIS — R0609 Other forms of dyspnea: Secondary | ICD-10-CM | POA: Diagnosis not present

## 2020-11-06 DIAGNOSIS — F1010 Alcohol abuse, uncomplicated: Secondary | ICD-10-CM | POA: Insufficient documentation

## 2020-11-06 DIAGNOSIS — D500 Iron deficiency anemia secondary to blood loss (chronic): Secondary | ICD-10-CM | POA: Diagnosis not present

## 2020-11-06 DIAGNOSIS — Z79899 Other long term (current) drug therapy: Secondary | ICD-10-CM | POA: Diagnosis not present

## 2020-11-06 DIAGNOSIS — R634 Abnormal weight loss: Secondary | ICD-10-CM | POA: Diagnosis not present

## 2020-11-06 DIAGNOSIS — E118 Type 2 diabetes mellitus with unspecified complications: Secondary | ICD-10-CM

## 2020-11-06 DIAGNOSIS — F17210 Nicotine dependence, cigarettes, uncomplicated: Secondary | ICD-10-CM | POA: Diagnosis not present

## 2020-11-06 DIAGNOSIS — R630 Anorexia: Secondary | ICD-10-CM | POA: Insufficient documentation

## 2020-11-06 DIAGNOSIS — D508 Other iron deficiency anemias: Secondary | ICD-10-CM

## 2020-11-06 DIAGNOSIS — E559 Vitamin D deficiency, unspecified: Secondary | ICD-10-CM

## 2020-11-06 DIAGNOSIS — E1169 Type 2 diabetes mellitus with other specified complication: Secondary | ICD-10-CM

## 2020-11-06 LAB — IRON AND TIBC
Iron: 22 ug/dL — ABNORMAL LOW (ref 41–142)
Saturation Ratios: 4 % — ABNORMAL LOW (ref 21–57)
TIBC: 545 ug/dL — ABNORMAL HIGH (ref 236–444)
UIBC: 523 ug/dL — ABNORMAL HIGH (ref 120–384)

## 2020-11-06 LAB — CBC WITH DIFFERENTIAL (CANCER CENTER ONLY)
Abs Immature Granulocytes: 0.06 10*3/uL (ref 0.00–0.07)
Basophils Absolute: 0 10*3/uL (ref 0.0–0.1)
Basophils Relative: 0 %
Eosinophils Absolute: 0.1 10*3/uL (ref 0.0–0.5)
Eosinophils Relative: 1 %
HCT: 28.5 % — ABNORMAL LOW (ref 36.0–46.0)
Hemoglobin: 8.5 g/dL — ABNORMAL LOW (ref 12.0–15.0)
Immature Granulocytes: 1 %
Lymphocytes Relative: 14 %
Lymphs Abs: 1.2 10*3/uL (ref 0.7–4.0)
MCH: 24.2 pg — ABNORMAL LOW (ref 26.0–34.0)
MCHC: 29.8 g/dL — ABNORMAL LOW (ref 30.0–36.0)
MCV: 81.2 fL (ref 80.0–100.0)
Monocytes Absolute: 0.5 10*3/uL (ref 0.1–1.0)
Monocytes Relative: 6 %
Neutro Abs: 6.8 10*3/uL (ref 1.7–7.7)
Neutrophils Relative %: 78 %
Platelet Count: 224 10*3/uL (ref 150–400)
RBC: 3.51 MIL/uL — ABNORMAL LOW (ref 3.87–5.11)
RDW: 16.5 % — ABNORMAL HIGH (ref 11.5–15.5)
WBC Count: 8.6 10*3/uL (ref 4.0–10.5)
nRBC: 0 % (ref 0.0–0.2)

## 2020-11-06 LAB — FERRITIN: Ferritin: 7 ng/mL — ABNORMAL LOW (ref 11–307)

## 2020-11-06 NOTE — Telephone Encounter (Signed)
-----   Message from Ellamae Sia sent at 10/20/2020  9:20 AM EDT ----- Regarding: Lab or[facility] for Friday, 7.29.22 Patient is scheduled for CPX labs, please order future labs, Thanks , Karna Christmas

## 2020-11-07 ENCOUNTER — Ambulatory Visit (INDEPENDENT_AMBULATORY_CARE_PROVIDER_SITE_OTHER): Payer: Medicare HMO

## 2020-11-07 ENCOUNTER — Other Ambulatory Visit (INDEPENDENT_AMBULATORY_CARE_PROVIDER_SITE_OTHER): Payer: Medicare HMO

## 2020-11-07 ENCOUNTER — Other Ambulatory Visit: Payer: Self-pay

## 2020-11-07 DIAGNOSIS — E785 Hyperlipidemia, unspecified: Secondary | ICD-10-CM

## 2020-11-07 DIAGNOSIS — Z0000 Encounter for general adult medical examination without abnormal findings: Secondary | ICD-10-CM | POA: Diagnosis not present

## 2020-11-07 DIAGNOSIS — K7030 Alcoholic cirrhosis of liver without ascites: Secondary | ICD-10-CM | POA: Diagnosis not present

## 2020-11-07 DIAGNOSIS — E1169 Type 2 diabetes mellitus with other specified complication: Secondary | ICD-10-CM

## 2020-11-07 DIAGNOSIS — D500 Iron deficiency anemia secondary to blood loss (chronic): Secondary | ICD-10-CM | POA: Diagnosis not present

## 2020-11-07 DIAGNOSIS — E559 Vitamin D deficiency, unspecified: Secondary | ICD-10-CM

## 2020-11-07 DIAGNOSIS — E118 Type 2 diabetes mellitus with unspecified complications: Secondary | ICD-10-CM

## 2020-11-07 LAB — TSH: TSH: 3.77 u[IU]/mL (ref 0.35–5.50)

## 2020-11-07 LAB — COMPREHENSIVE METABOLIC PANEL
ALT: 8 U/L (ref 0–35)
AST: 17 U/L (ref 0–37)
Albumin: 4 g/dL (ref 3.5–5.2)
Alkaline Phosphatase: 77 U/L (ref 39–117)
BUN: 16 mg/dL (ref 6–23)
CO2: 29 mEq/L (ref 19–32)
Calcium: 9.6 mg/dL (ref 8.4–10.5)
Chloride: 100 mEq/L (ref 96–112)
Creatinine, Ser: 0.82 mg/dL (ref 0.40–1.20)
GFR: 72.74 mL/min (ref >60.00)
Glucose, Bld: 102 mg/dL — ABNORMAL HIGH (ref 70–99)
Potassium: 4.2 mEq/L (ref 3.5–5.1)
Sodium: 137 mEq/L (ref 135–145)
Total Bilirubin: 0.6 mg/dL (ref 0.2–1.2)
Total Protein: 6.8 g/dL (ref 6.0–8.3)

## 2020-11-07 LAB — CBC WITH DIFFERENTIAL/PLATELET
Basophils Absolute: 0 10*3/uL (ref 0.0–0.1)
Basophils Relative: 0.5 % (ref 0.0–3.0)
Eosinophils Absolute: 0.1 10*3/uL (ref 0.0–0.7)
Eosinophils Relative: 1.2 % (ref 0.0–5.0)
HCT: 26.7 % — ABNORMAL LOW (ref 36.0–46.0)
Hemoglobin: 8.4 g/dL — ABNORMAL LOW (ref 12.0–15.0)
Lymphocytes Relative: 22.3 % (ref 12.0–46.0)
Lymphs Abs: 1.9 10*3/uL (ref 0.7–4.0)
MCHC: 31.6 g/dL (ref 30.0–36.0)
MCV: 76.3 fl — ABNORMAL LOW (ref 78.0–100.0)
Monocytes Absolute: 0.7 10*3/uL (ref 0.1–1.0)
Monocytes Relative: 8.5 % (ref 3.0–12.0)
Neutro Abs: 5.7 10*3/uL (ref 1.4–7.7)
Neutrophils Relative %: 67.5 % (ref 43.0–77.0)
Platelets: 235 10*3/uL (ref 150.0–400.0)
RBC: 3.5 Mil/uL — ABNORMAL LOW (ref 3.87–5.11)
RDW: 18 % — ABNORMAL HIGH (ref 11.5–15.5)
WBC: 8.5 10*3/uL (ref 4.0–10.5)

## 2020-11-07 LAB — LIPID PANEL
Cholesterol: 142 mg/dL (ref 0–200)
HDL: 34.8 mg/dL — ABNORMAL LOW (ref >39.00)
LDL Cholesterol: 82 mg/dL (ref 0–99)
NonHDL: 106.98
Total CHOL/HDL Ratio: 4
Triglycerides: 124 mg/dL (ref 0.0–149.0)
VLDL: 24.8 mg/dL (ref 0.0–40.0)

## 2020-11-07 LAB — VITAMIN D 25 HYDROXY (VIT D DEFICIENCY, FRACTURES): VITD: 93.19 ng/mL (ref 30.00–100.00)

## 2020-11-07 LAB — HEMOGLOBIN A1C: Hgb A1c MFr Bld: 6.4 % (ref 4.6–6.5)

## 2020-11-07 NOTE — Patient Instructions (Signed)
Christine Barton , Thank you for taking time to come for your Medicare Wellness Visit. I appreciate your ongoing commitment to your health goals. Please review the following plan we discussed and let me know if I can assist you in the future.   Screening recommendations/referrals: Colonoscopy: Up to date, completed 07/27/2019, due 07/2022 Mammogram: Up to date, completed 01/17/2020, due 01/2021 Bone Density: Up to date, completed 11/03/2016, due 10/2021 Recommended yearly ophthalmology/optometry visit for glaucoma screening and checkup Recommended yearly dental visit for hygiene and checkup  Vaccinations: Influenza vaccine: Up to date, completed 01/19/2020, due 11/2020 Pneumococcal vaccine: Completed series Tdap vaccine: Up to date, completed 09/12/2013, due 09/2023 Shingles vaccine: due, check with your insurance regarding coverage if interested    Covid-19:completed 3 vaccines   Advanced directives: Advance directive discussed with you today. Even though you declined this today please call our office should you change your mind and we can give you the proper paperwork for you to fill out.  Conditions/risks identified: diabetes, hyperlipidemia   Next appointment: Follow up in one year for your annual wellness visit    Preventive Care 25 Years and Older, Female Preventive care refers to lifestyle choices and visits with your health care provider that can promote health and wellness. What does preventive care include? A yearly physical exam. This is also called an annual well check. Dental exams once or twice a year. Routine eye exams. Ask your health care provider how often you should have your eyes checked. Personal lifestyle choices, including: Daily care of your teeth and gums. Regular physical activity. Eating a healthy diet. Avoiding tobacco and drug use. Limiting alcohol use. Practicing safe sex. Taking low-dose aspirin every day. Taking vitamin and mineral supplements as recommended by  your health care provider. What happens during an annual well check? The services and screenings done by your health care provider during your annual well check will depend on your age, overall health, lifestyle risk factors, and family history of disease. Counseling  Your health care provider may ask you questions about your: Alcohol use. Tobacco use. Drug use. Emotional well-being. Home and relationship well-being. Sexual activity. Eating habits. History of falls. Memory and ability to understand (cognition). Work and work Statistician. Reproductive health. Screening  You may have the following tests or measurements: Height, weight, and BMI. Blood pressure. Lipid and cholesterol levels. These may be checked every 5 years, or more frequently if you are over 59 years old. Skin check. Lung cancer screening. You may have this screening every year starting at age 43 if you have a 30-pack-year history of smoking and currently smoke or have quit within the past 15 years. Fecal occult blood test (FOBT) of the stool. You may have this test every year starting at age 82. Flexible sigmoidoscopy or colonoscopy. You may have a sigmoidoscopy every 5 years or a colonoscopy every 10 years starting at age 8. Hepatitis C blood test. Hepatitis B blood test. Sexually transmitted disease (STD) testing. Diabetes screening. This is done by checking your blood sugar (glucose) after you have not eaten for a while (fasting). You may have this done every 1-3 years. Bone density scan. This is done to screen for osteoporosis. You may have this done starting at age 69. Mammogram. This may be done every 1-2 years. Talk to your health care provider about how often you should have regular mammograms. Talk with your health care provider about your test results, treatment options, and if necessary, the need for more tests. Vaccines  Your health care provider may recommend certain vaccines, such as: Influenza  vaccine. This is recommended every year. Tetanus, diphtheria, and acellular pertussis (Tdap, Td) vaccine. You may need a Td booster every 10 years. Zoster vaccine. You may need this after age 51. Pneumococcal 13-valent conjugate (PCV13) vaccine. One dose is recommended after age 8. Pneumococcal polysaccharide (PPSV23) vaccine. One dose is recommended after age 64. Talk to your health care provider about which screenings and vaccines you need and how often you need them. This information is not intended to replace advice given to you by your health care provider. Make sure you discuss any questions you have with your health care provider. Document Released: 04/25/2015 Document Revised: 12/17/2015 Document Reviewed: 01/28/2015 Elsevier Interactive Patient Education  2017 [place] Prevention in the Home Falls can cause injuries. They can happen to people of all ages. There are many things you can do to make your home safe and to help prevent falls. What can I do on the outside of my home? Regularly fix the edges of walkways and driveways and fix any cracks. Remove anything that might make you trip as you walk through a door, such as a raised step or threshold. Trim any bushes or trees on the path to your home. Use bright outdoor lighting. Clear any walking paths of anything that might make someone trip, such as rocks or tools. Regularly check to see if handrails are loose or broken. Make sure that both sides of any steps have handrails. Any raised decks and porches should have guardrails on the edges. Have any leaves, snow, or ice cleared regularly. Use sand or salt on walking paths during winter. Clean up any spills in your garage right away. This includes oil or grease spills. What can I do in the bathroom? Use night lights. Install grab bars by the toilet and in the tub and shower. Do not use towel bars as grab bars. Use non-skid mats or decals in the tub or shower. If you  need to sit down in the shower, use a plastic, non-slip stool. Keep the floor dry. Clean up any water that spills on the floor as soon as it happens. Remove soap buildup in the tub or shower regularly. Attach bath mats securely with double-sided non-slip rug tape. Do not have throw rugs and other things on the floor that can make you trip. What can I do in the bedroom? Use night lights. Make sure that you have a light by your bed that is easy to reach. Do not use any sheets or blankets that are too big for your bed. They should not hang down onto the floor. Have a firm chair that has side arms. You can use this for support while you get dressed. Do not have throw rugs and other things on the floor that can make you trip. What can I do in the kitchen? Clean up any spills right away. Avoid walking on wet floors. Keep items that you use a lot in easy-to-reach places. If you need to reach something above you, use a strong step stool that has a grab bar. Keep electrical cords out of the way. Do not use floor polish or wax that makes floors slippery. If you must use wax, use non-skid floor wax. Do not have throw rugs and other things on the floor that can make you trip. What can I do with my stairs? Do not leave any items on the stairs. Make sure that there are  handrails on both sides of the stairs and use them. Fix handrails that are broken or loose. Make sure that handrails are as long as the stairways. Check any carpeting to make sure that it is firmly attached to the stairs. Fix any carpet that is loose or worn. Avoid having throw rugs at the top or bottom of the stairs. If you do have throw rugs, attach them to the floor with carpet tape. Make sure that you have a light switch at the top of the stairs and the bottom of the stairs. If you do not have them, ask someone to add them for you. What else can I do to help prevent falls? Wear shoes that: Do not have high heels. Have rubber  bottoms. Are comfortable and fit you well. Are closed at the toe. Do not wear sandals. If you use a stepladder: Make sure that it is fully opened. Do not climb a closed stepladder. Make sure that both sides of the stepladder are locked into place. Ask someone to hold it for you, if possible. Clearly mark and make sure that you can see: Any grab bars or handrails. First and last steps. Where the edge of each step is. Use tools that help you move around (mobility aids) if they are needed. These include: Canes. Walkers. Scooters. Crutches. Turn on the lights when you go into a dark area. Replace any light bulbs as soon as they burn out. Set up your furniture so you have a clear path. Avoid moving your furniture around. If any of your floors are uneven, fix them. If there are any pets around you, be aware of where they are. Review your medicines with your doctor. Some medicines can make you feel dizzy. This can increase your chance of falling. Ask your doctor what other things that you can do to help prevent falls. This information is not intended to replace advice given to you by your health care provider. Make sure you discuss any questions you have with your health care provider. Document Released: 01/23/2009 Document Revised: 09/04/2015 Document Reviewed: 05/03/2014 Elsevier Interactive Patient Education  2017 [employer].

## 2020-11-07 NOTE — Progress Notes (Signed)
PCP notes:  Health Maintenance: Shingrix- due    Abnormal Screenings: none   Patient concerns: Loss of appetite   Nurse concerns: none   Next PCP appt.: 11/12/2020 @ 12 pm

## 2020-11-07 NOTE — Progress Notes (Signed)
Subjective:   Christine Barton is a 70 y.o. female who presents for Medicare Annual (Subsequent) preventive examination.  Review of Systems: N/A      I connected with the patient today by telephone and verified that I am speaking with the correct person using two identifiers. Location patient: home Location nurse: work Persons participating in the telephone visit: patient, nurse.   I discussed the limitations, risks, security and privacy concerns of performing an evaluation and management service by telephone and the availability of in person appointments. I also discussed with the patient that there may be a patient responsible charge related to this service. The patient expressed understanding and verbally consented to this telephonic visit.        Cardiac Risk Factors include: advanced age (>26mn, >>8women);diabetes mellitus;Other (see comment), Risk factor comments: hyperlipidemia     Objective:    Today's Vitals   There is no height or weight on file to calculate BMI.  Advanced Directives 11/07/2020 08/06/2020 04/16/2020 04/09/2020 04/01/2020 03/27/2020 03/03/2020  Does Patient Have a Medical Advance Directive? No No No No No No No  Type of Advance Directive - - - - - - -  Does patient want to make changes to medical advance directive? No - Patient declined - No - Patient declined No - Patient declined No - Patient declined - No - Patient declined  [place]? - - - - - - -  Would patient like information on creating a medical advance directive? - No - Patient declined - - - No - Patient declined -    Current Medications (verified) Outpatient Encounter Medications as of 11/07/2020  Medication Sig   albuterol (VENTOLIN HFA) 108 (90 Base) MCG/ACT inhaler INHALE 2 PUFFS INTO THE LUNGS EVERY 4 HOURS AS NEEDED FOR WHEEZE OR FOR SHORTNESS OF BREATH   budesonide-formoterol (SYMBICORT) 160-4.5 MCG/ACT inhaler Inhale 2 puffs into the lungs 2 (two)  times daily.   cholecalciferol (VITAMIN D3) 25 MCG (1000 UNIT) tablet Take 1,000 Units by mouth 2 (two) times daily.   clorazepate (TRANXENE) 3.75 MG tablet Take 1 tablet (3.75 mg total) by mouth 2 (two) times daily.   cyclobenzaprine (FLEXERIL) 10 MG tablet TAKE 1/2 TABLET BY MOUTH AT BEDTIME AS NEEDED FOR MUSCLE SPASMS   furosemide (LASIX) 20 MG tablet TAKE 1 TABLET BY MOUTH EVERY DAY   lactulose (CHRONULAC) 10 GM/15ML solution Take 15 mLs (10 g total) by mouth 3 (three) times daily.   metFORMIN (GLUCOPHAGE-XR) 500 MG 24 hr tablet TAKE 1 TABLET BY MOUTH EVERY DAY WITH BREAKFAST   Multiple Vitamin (MULTIVITAMIN) tablet Take 1 tablet by mouth daily.   Multiple Vitamins-Minerals (PRESERVISION AREDS 2) CAPS Take 1 capsule by mouth 2 (two) times daily.   mupirocin ointment (BACTROBAN) 2 % APPLY 1 APPLICATION TOPICALLY 2 (TWO) TIMES DAILY. TO AFFECTED AREAS   nadolol (CORGARD) 20 MG tablet TAKE 1 TABLET BY MOUTH EVERY DAY   nicotine (NICODERM CQ - DOSED IN MG/24 HOURS) 21 mg/24hr patch Place 1 patch (21 mg total) onto the skin daily.   pantoprazole (PROTONIX) 40 MG tablet TAKE 1 TABLET (40 MG TOTAL) BY MOUTH 2 (TWO) TIMES DAILY BEFORE A MEAL.   Polyethyl Glycol-Propyl Glycol (SYSTANE OP) Place 1 drop into the left eye daily as needed (dryness).   spironolactone (ALDACTONE) 25 MG tablet Take 1 tablet (25 mg total) by mouth daily.   SYSTANE COMPLETE 0.6 % SOLN Apply 1 drop to eye 2 (two)  times daily.   temazepam (RESTORIL) 7.5 MG capsule TAKE 1 CAPSULE (7.5 MG TOTAL) BY MOUTH AT BEDTIME AS NEEDED FOR SLEEP.   traMADol (ULTRAM) 50 MG tablet    XIFAXAN 550 MG TABS tablet TAKE 1 TABLET BY MOUTH TWICE A DAY   No facility-administered encounter medications on file as of 11/07/2020.    Allergies (verified) Oxycodone and Glipizide   History: Past Medical History:  Diagnosis Date   Alcohol abuse, unspecified    Breast cancer ([place]) 1998   Right   Cataract    left eye   Cervical spondylosis 2006    MRI   Degeneration of cervical intervertebral disc 2006   MRI   Diabetes mellitus without complication (HCC)    Diverticulosis of colon (without mention of hemorrhage)    Hyperpotassemia    Microscopic hematuria    Mononeuritis of unspecified site    Nonspecific abnormal results of liver function study    Other abnormal glucose    Other and unspecified hyperlipidemia    no per pt   Other chronic nonalcoholic liver disease    Personal history of chemotherapy    Personal history of malignant neoplasm of breast    Personal history of radiation therapy    Pneumothorax, acute    right, spontaneous   Tobacco use disorder    Unspecified vitamin D deficiency    Past Surgical History:  Procedure Laterality Date   BIOPSY  07/27/2019   Procedure: BIOPSY;  Surgeon: Yetta Flock, MD;  Location: [facility];  Service: Gastroenterology;;   BREAST BIOPSY  9/03   Right   BREAST LUMPECTOMY Right 1998   CHEST TUBE INSERTION  11/02/2014   COLONOSCOPY     COLONOSCOPY WITH PROPOFOL N/A 07/27/2019   Procedure: COLONOSCOPY WITH PROPOFOL;  Surgeon: Yetta Flock, MD;  Location: [facility];  Service: Gastroenterology;  Laterality: N/A;   ESOPHAGOGASTRODUODENOSCOPY (EGD) WITH PROPOFOL N/A 10/30/2018   Procedure: ESOPHAGOGASTRODUODENOSCOPY (EGD) WITH PROPOFOL;  Surgeon: Mauri Pole, MD;  Location: [facility];  Service: Endoscopy;  Laterality: N/A;   ESOPHAGOGASTRODUODENOSCOPY (EGD) WITH PROPOFOL N/A 03/12/2019   Procedure: ESOPHAGOGASTRODUODENOSCOPY (EGD) WITH PROPOFOL;  Surgeon: Mauri Pole, MD;  Location: [facility];  Service: Endoscopy;  Laterality: N/A;   ESOPHAGOGASTRODUODENOSCOPY (EGD) WITH PROPOFOL N/A 07/27/2019   Procedure: ESOPHAGOGASTRODUODENOSCOPY (EGD) WITH PROPOFOL;  Surgeon: Yetta Flock, MD;  Location: [facility];  Service: Gastroenterology;  Laterality: N/A;   EYE SURGERY  02/2017   cataract extraction with lens implant-left   HOT HEMOSTASIS N/A  07/27/2019   Procedure: HOT HEMOSTASIS (ARGON PLASMA COAGULATION/BICAP);  Surgeon: Yetta Flock, MD;  Location: [facility];  Service: Gastroenterology;  Laterality: N/A;   MOUTH SURGERY     POLYPECTOMY  07/27/2019   Procedure: POLYPECTOMY;  Surgeon: Yetta Flock, MD;  Location: [facility];  Service: Gastroenterology;;   TUBAL LIGATION     Family History  Problem Relation Age of Onset   Heart failure Father    Heart attack Father    Colon cancer Maternal Uncle    Stroke Mother    Esophageal cancer Neg Hx    Rectal cancer Neg Hx    Stomach cancer Neg Hx    Pancreatic cancer Neg Hx    Social History   Socioeconomic History   Marital status: Single    Spouse name: Not on file   Number of children: 1   Years of education: Not on file   Highest education level: Not on file  Occupational History  Occupation: retired    Fish farm manager: [employer]  Tobacco Use   Smoking status: Every Day    Packs/day: 1.00    Years: 53.00    Pack years: 53.00    Types: Cigarettes   Smokeless tobacco: Never   Tobacco comments:    Patient is engaged in health coaching for smoking cessation   Vaping Use   Vaping Use: Never used  Substance and Sexual Activity   Alcohol use: Not Currently   Drug use: No   Sexual activity: Not Currently  Other Topics Concern   Not on file  Social History Narrative   Divorced      1 child      Works at [employer] Strain: Low Risk    Difficulty of Paying Living Expenses: Not hard at all  Food Insecurity: No Food Insecurity   Worried About Charity fundraiser in the Last Year: Never true   Arboriculturist in the Last Year: Never true  Transportation Needs: No Transportation Needs   Lack of Transportation (Medical): No   Lack of Transportation (Non-Medical): No  Physical Activity: Inactive   Days of Exercise per Week: 0 days   Minutes of Exercise per Session: 0 min   Stress: No Stress Concern Present   Feeling of Stress : Not at all  Social Connections: Not on file    Tobacco Counseling Ready to quit: Not Answered Counseling given: Not Answered Tobacco comments: Patient is engaged in health coaching for smoking cessation    Clinical Intake:  Pre-visit preparation completed: Yes  Pain : No/denies pain     Nutritional Risks: None Diabetes: Yes CBG done?: No Did pt. bring in CBG monitor from home?: No  How often do you need to have someone help you when you read instructions, pamphlets, or other written materials from your doctor or pharmacy?: 1 - Never  Diabetic: Yes Nutrition Risk Assessment:  Has the patient had any N/V/D within the last 2 months?  No  Does the patient have any non-healing wounds?  No  Has the patient had any unintentional weight loss or weight gain?  No   Diabetes:  Is the patient diabetic?  Yes  If diabetic, was a CBG obtained today?  No  telephone visit  Did the patient bring in their glucometer from home?  No  telephone visit  How often do you monitor your CBG's? never.   Financial Strains and Diabetes Management:  Are you having any financial strains with the device, your supplies or your medication? No .  Does the patient want to be seen by Chronic Care Management for management of their diabetes?  No  Would the patient like to be referred to a Nutritionist or for Diabetic Management?  No   Diabetic Exams:  Diabetic Eye Exam: Completed 12/24/2019 Diabetic Foot Exam: Completed 05/12/2020   Interpreter Needed?: No  Information entered by :: CJohnson, RN   Activities of Daily Living In your present state of health, do you have any difficulty performing the following activities: 11/07/2020  Hearing? N  Vision? N  Difficulty concentrating or making decisions? N  Walking or climbing stairs? N  Dressing or bathing? N  Doing errands, shopping? N  Preparing Food and eating ? N  Using the Toilet? N   In the past six months, have you accidently leaked urine? N  Do you have problems with loss  of bowel control? N  Managing your Medications? N  Managing your Finances? N  Housekeeping or managing your Housekeeping? N  Some recent data might be hidden    Patient Care Team: Tower, [name], MD as PCP - General  Indicate any recent Medical Services you may have received from other than [facility] providers in the past year (date may be approximate).     Assessment:   This is a routine wellness examination for [facility].  Hearing/Vision screen Vision Screening - Comments:: Patient gets annual eye exams   Dietary issues and exercise activities discussed: Current Exercise Habits: The patient does not participate in regular exercise at present, Exercise limited by: None identified   Goals Addressed             This Visit's Progress    Patient Stated       11/07/2020, I will maintain and continue medications as prescribed.        Depression Screen PHQ 2/9 Scores 11/07/2020 11/07/2019 11/06/2018 10/31/2017 10/26/2016  PHQ - 2 Score 0 6 0 0 0  PHQ- 9 Score 0 6 3 0 3    Fall Risk Fall Risk  11/07/2020 11/07/2019 11/06/2018 10/31/2017 10/26/2016  Falls in the past year? 0 0 1 No Yes  Number falls in past yr: 0 0 1 - 1  Comment - - has to do with liver - -  Injury with Fall? 0 0 0 - Yes  Risk for fall due to : Medication side effect Medication side effect History of fall(s);Medication side effect - -  Follow up Falls evaluation completed;Falls prevention discussed Falls evaluation completed;Falls prevention discussed Falls evaluation completed;Falls prevention discussed - -    FALL RISK PREVENTION PERTAINING TO THE HOME:  Any stairs in or around the home? Yes  If so, are there any without handrails? No  Home free of loose throw rugs in walkways, pet beds, electrical cords, etc? Yes  Adequate lighting in your home to reduce risk of falls? Yes   ASSISTIVE DEVICES UTILIZED TO PREVENT  FALLS:  Life alert? No  Use of a cane, walker or w/c? No  Grab bars in the bathroom? No  Shower chair or bench in shower? No  Elevated toilet seat or a handicapped toilet? No   TIMED UP AND GO:  Was the test performed?  N/A telephone visit .    Cognitive Function: MMSE - Mini Mental State Exam 11/07/2020 11/07/2019 11/06/2018 10/31/2017  Not completed: Refused - - -  Orientation to time - 5 4 5   Orientation to Place - 5 5 5   Registration - 3 3 3   Attention/ Calculation - 5 2 0  Attention/Calculation-comments - - missed r,o,w -  Recall - 3 2 3   Recall-comments - - missed penny -  Language- name 2 objects - - 0 0  Language- repeat - 1 1 1   Language- follow 3 step command - - 0 3  Language- read & follow direction - - 0 0  Write a sentence - - 0 0  Copy design - - 0 0  Total score - - 17 20  Mini Cog  Mini-Cog screen was not completed. Patient refused. Maximum score is 22. A value of 0 denotes this part of the MMSE was not completed or the patient failed this part of the Mini-Cog screening.       Immunizations Immunization History  Administered Date(s) Administered   Fluad Quad(high Dose 65+) 12/04/2018, 01/19/2020   Hep A / Hep B  09/05/2018, 10/10/2018, 03/29/2019   Influenza Split 03/10/2011   Influenza Whole 02/11/2000, 02/06/2016   Influenza,inj,Quad PF,6+ Mos 02/16/2017, 04/18/2018   Influenza-Unspecified 02/11/2016   PFIZER(Purple Top)SARS-COV-2 Vaccination 05/21/2019, 06/16/2019, 02/13/2020   Pneumococcal Conjugate-13 10/26/2016   Pneumococcal Polysaccharide-23 06/28/2008, 11/07/2017   Td 05/21/2002   Tdap 09/12/2013   Zoster, Live 08/24/2011    TDAP status: Up to date  Flu Vaccine status: Up to date  Pneumococcal vaccine status: Up to date  Covid-19 vaccine status: Completed 3 vaccines  Qualifies for Shingles Vaccine? Yes   Zostavax completed Yes   Shingrix Completed?: No.    Education has been provided regarding the importance of this vaccine. Patient  has been advised to call insurance company to determine out of pocket expense if they have not yet received this vaccine. Advised may also receive vaccine at local pharmacy or Health Dept. Verbalized acceptance and understanding.  Screening Tests Health Maintenance  Topic Date Due   Zoster Vaccines- Shingrix (1 of 2) Never done   COVID-19 Vaccine (4 - Booster for Pfizer series) 05/15/2020   URINE MICROALBUMIN  11/01/2020   HEMOGLOBIN A1C  11/03/2020   INFLUENZA VACCINE  11/10/2020   OPHTHALMOLOGY EXAM  12/23/2020   MAMMOGRAM  01/16/2021   FOOT EXAM  05/12/2021   COLONOSCOPY (Pts 45-23yr Insurance coverage will need to be confirmed)  07/27/2022   TETANUS/TDAP  09/13/2023   DEXA SCAN  Completed   Hepatitis C Screening  Completed   PNA vac Low Risk Adult  Completed   HPV VACCINES  Aged Out    Health Maintenance  Health Maintenance Due  Topic Date Due   Zoster Vaccines- Shingrix (1 of 2) Never done   COVID-19 Vaccine (4 - Booster for Pfizer series) 05/15/2020   URINE MICROALBUMIN  11/01/2020   HEMOGLOBIN A1C  11/03/2020    Colorectal cancer screening: Type of screening: Colonoscopy. Completed 07/27/2019. Repeat every 3 years  Mammogram status: Completed 01/17/2020. Repeat every year  Bone Density status: Completed 11/03/2016. Results reflect: Bone density results: NORMAL. Repeat every 5 years.  Lung Cancer Screening: (Low Dose CT Chest recommended if Age 70-80years, 30 pack-year currently smoking OR have quit w/in 15 years.) does not qualify.   Lung Cancer Screening referral: completed 01/24/2020  Additional Screening:  Hepatitis C Screening: does qualify; Completed 12/08/2018  Vision Screening: Recommended annual ophthalmology exams for early detection of glaucoma and other disorders of the eye. Is the patient up to date with their annual eye exam?  Yes  Who is the provider or what is the name of the office in which the patient attends annual eye exams? Dr. WJimmye Norman If pt  is not established with a provider, would they like to be referred to a provider to establish care? No .   Dental Screening: Recommended annual dental exams for proper oral hygiene  Community Resource Referral / Chronic Care Management: CRR required this visit?  No   CCM required this visit?  No      Plan:     I have personally reviewed and noted the following in the patient's chart:   Medical and social history Use of alcohol, tobacco or illicit drugs  Current medications and supplements including opioid prescriptions.  Functional ability and status Nutritional status Physical activity Advanced directives List of other physicians Hospitalizations, surgeries, and ER visits in previous 12 months Vitals Screenings to include cognitive, depression, and falls Referrals and appointments  In addition, I have reviewed and discussed with patient certain preventive protocols,  quality metrics, and best practice recommendations. A written personalized care plan for preventive services as well as general preventive health recommendations were provided to patient.   Due to this being a telephonic visit, the after visit summary with patients personalized plan was offered to patient via office or my-chart. Patient preferred to pick up at office at next visit or via mychart.   Andrez Grime, LPN   [id number]

## 2020-11-08 ENCOUNTER — Other Ambulatory Visit: Payer: Self-pay | Admitting: Family Medicine

## 2020-11-11 NOTE — Telephone Encounter (Signed)
She gets thrush intermittently

## 2020-11-11 NOTE — Telephone Encounter (Signed)
Last filled on 01/09/20, looks like GI d/c med in May saying pt was finished with that course. CPE scheduled 11/12/20

## 2020-11-12 ENCOUNTER — Other Ambulatory Visit: Payer: Self-pay

## 2020-11-12 ENCOUNTER — Ambulatory Visit (INDEPENDENT_AMBULATORY_CARE_PROVIDER_SITE_OTHER): Payer: Medicare HMO | Admitting: Family Medicine

## 2020-11-12 ENCOUNTER — Encounter: Payer: Self-pay | Admitting: Family Medicine

## 2020-11-12 ENCOUNTER — Telehealth: Payer: Self-pay

## 2020-11-12 VITALS — BP 112/56 | HR 80 | Temp 97.6°F | Ht 64.5 in | Wt 106.4 lb

## 2020-11-12 DIAGNOSIS — K5520 Angiodysplasia of colon without hemorrhage: Secondary | ICD-10-CM | POA: Diagnosis not present

## 2020-11-12 DIAGNOSIS — I700 Atherosclerosis of aorta: Secondary | ICD-10-CM

## 2020-11-12 DIAGNOSIS — K7030 Alcoholic cirrhosis of liver without ascites: Secondary | ICD-10-CM | POA: Diagnosis not present

## 2020-11-12 DIAGNOSIS — G5793 Unspecified mononeuropathy of bilateral lower limbs: Secondary | ICD-10-CM

## 2020-11-12 DIAGNOSIS — Z0000 Encounter for general adult medical examination without abnormal findings: Secondary | ICD-10-CM

## 2020-11-12 DIAGNOSIS — E118 Type 2 diabetes mellitus with unspecified complications: Secondary | ICD-10-CM | POA: Diagnosis not present

## 2020-11-12 DIAGNOSIS — E1169 Type 2 diabetes mellitus with other specified complication: Secondary | ICD-10-CM | POA: Diagnosis not present

## 2020-11-12 DIAGNOSIS — E46 Unspecified protein-calorie malnutrition: Secondary | ICD-10-CM

## 2020-11-12 DIAGNOSIS — E559 Vitamin D deficiency, unspecified: Secondary | ICD-10-CM

## 2020-11-12 DIAGNOSIS — E785 Hyperlipidemia, unspecified: Secondary | ICD-10-CM

## 2020-11-12 DIAGNOSIS — F17200 Nicotine dependence, unspecified, uncomplicated: Secondary | ICD-10-CM

## 2020-11-12 DIAGNOSIS — D500 Iron deficiency anemia secondary to blood loss (chronic): Secondary | ICD-10-CM | POA: Diagnosis not present

## 2020-11-12 DIAGNOSIS — I8510 Secondary esophageal varices without bleeding: Secondary | ICD-10-CM

## 2020-11-12 NOTE — Assessment & Plan Note (Signed)
Vitamin D level is therapeutic with current supplementation Disc importance of this to bone and overall health Level is in the 90s

## 2020-11-12 NOTE — Assessment & Plan Note (Signed)
Tried crestor but it caused R sided abd pain  Disc goals for lipids and reasons to control them Rev last labs with pt Rev low sat fat diet in detail Watching diet  LDL of 81

## 2020-11-12 NOTE — Patient Instructions (Addendum)
If you are interested in the new shingles vaccine (Shingrix) - call your local pharmacy to check on coverage and availability  If affordable, get on a wait list at your pharmacy to get the vaccine.  You qualify for the 2nd covid booster if/when you can   Get a flu shot in the fall   Stay off the cholesterol medicine  The good cholesterol is too low (exercise helps)   Keep working to keep calories up /eat regularly  Especially protein   Get your iron infusions going

## 2020-11-12 NOTE — Assessment & Plan Note (Signed)
Reviewed diet  Supplementing protein drinks Enc to get these in addn to regular meals  Difficult with low appetite Trying to quit smoking again may help

## 2020-11-12 NOTE — Assessment & Plan Note (Signed)
Continues nadolol and GI f/u  Still anemic  No melena or hematemesis  Continues etoh avoidance

## 2020-11-12 NOTE — Progress Notes (Signed)
Subjective:    Patient ID: Christine Barton, female    DOB: Sep 09, 1950, 70 y.o.   MRN: [id number]  This visit occurred during the SARS-CoV-2 public health emergency.  Safety protocols were in place, including screening questions prior to the visit, additional usage of staff PPE, and extensive cleaning of exam room while observing appropriate contact time as indicated for disinfecting solutions.   HPI Here for health maintenance exam and to review chronic medical problems    Wt Readings from Last 3 Encounters:  11/12/20 106 lb 6 oz (48.3 kg)  11/06/20 107 lb 11.2 oz (48.9 kg)  08/25/20 113 lb (51.3 kg)  Was up to 115 lb  Appetite comes and goes  17.98 kg/m  Had amw on 11/07/20  Has been busy fostering animals/kittens (with medical issues)  Also ferile cats (loves it) - with [place] services   No regular exercise    Zoster status -interested in shingrix  Covid immunized with booster  Flu shot-fall  Mammogram 10/21 Personal history of breast cancer  Self breast exam -no lumps   Colonoscopy 4/21 with 3 y recall   Dexa 7/18 -bmd in the normal range (noted also has arthritis)  Wants to re check in a year  Falls-none  Fractures -none  Supplements- takes her vitamin D  D level is 93.1  Exercise -needs more   Smoking status - still smoking 1ppd  She is engaged in health coaching for smoking cessation  She quit for 3 weeks and started back  Not ready to quit  Has patches to try again when ready   Mood - has been pretty good  Concerned about weight and lack of hunger  Works really hard to get more calories in every day    Has MRI planned for sept and has to have a follow up with GI also (needs to make appt)    H/o aortic atherosclerosis  BP Readings from Last 3 Encounters:  11/12/20 (!) 112/56  11/06/20 (!) 124/55  08/25/20 112/60   Pulse Readings from Last 3 Encounters:  11/12/20 80  11/06/20 75  08/25/20 78    H/o alcoholic liver dz with esoh  varicies Takes nadolol 20 mg daily  Also aldactone 25 mg daily and lasix  Also avm of colon and h/o bleeding   DM2 Lab Results  Component Value Date   HGBA1C 6.4 11/07/2020   This is up from 5.2 Still well controlled  Metformin xr 500 mg daily   She eats more sugar than she used to  Trying to get more calories in  Attentive to protein  Ensure and boost      Hyperlipidemia Lab Results  Component Value Date   CHOL 142 11/07/2020   CHOL 127 05/06/2020   CHOL 90 11/02/2019   Lab Results  Component Value Date   HDL 34.80 (L) 11/07/2020   HDL 43.00 05/06/2020   HDL 37.70 (L) 11/02/2019   Lab Results  Component Value Date   LDLCALC 82 11/07/2020   LDLCALC 57 05/06/2020   LDLCALC 34 11/02/2019   Lab Results  Component Value Date   TRIG 124.0 11/07/2020   TRIG 135.0 05/06/2020   TRIG 92.0 11/02/2019   Lab Results  Component Value Date   CHOLHDL 4 11/07/2020   CHOLHDL 3 05/06/2020   CHOLHDL 2 11/02/2019   Lab Results  Component Value Date   LDLDIRECT 83.0 10/31/2017   LDLDIRECT 145.1 10/10/2012   LDLDIRECT 151.4 07/21/2011   Was taking rosuvastatin  low dose -she stopped it because it causes abdominal pain   Anemia from chronic GI losses Lab Results  Component Value Date   WBC 8.5 11/07/2020   HGB 8.4 (L) 11/07/2020   HCT 26.7 (L) 11/07/2020   MCV 76.3 (L) 11/07/2020   PLT 235.0 11/07/2020   Intolerant of oral iron  Gets iron infusions -supposed to start this week through hematology   Lab Results  Component Value Date   CREATININE 0.82 11/07/2020   BUN 16 11/07/2020   NA 137 11/07/2020   K 4.2 11/07/2020   CL 100 11/07/2020   CO2 29 11/07/2020   Lab Results  Component Value Date   ALT 8 11/07/2020   AST 17 11/07/2020   ALKPHOS 77 11/07/2020   BILITOT 0.6 11/07/2020   Lab Results  Component Value Date   TSH 3.77 11/07/2020    Patient Active Problem List   Diagnosis Date Noted   Iron deficiency anemia due to chronic blood loss  11/06/2020   AVM (arteriovenous malformation) of colon    Benign neoplasm of colon    NASH (nonalcoholic steatohepatitis)    Malnutrition ([place]) 07/25/2019   Symptomatic anemia 07/25/2019   Tobacco abuse 07/25/2019   Mass of soft tissue of face 03/06/2019   HSV infection 02/23/2019   Aortic atherosclerosis ([place]) 01/22/2019   Coronary atherosclerosis 01/22/2019   Encounter for screening for lung cancer 01/04/2019   Low back pain 12/07/2018   Venous insufficiency 11/15/2018   Esophageal varices without bleeding ([place]) 10/27/2018   Anemia 10/23/2018   Fatigue 10/23/2018   Poor balance 05/30/2018   Generalized weakness 05/30/2018   Cirrhosis of liver ([place]) 04/24/2018   Ascites 04/24/2018   Gallstones 04/24/2018   Heme positive stool 04/24/2018   Screening mammogram, encounter for 11/07/2017   Smoker 11/07/2017   Screening examination for STD (sexually transmitted disease) 11/11/2016   Welcome to Medicare preventive visit 10/26/2016   Estrogen deficiency 10/26/2016   Colon cancer screening 11/01/2014   Encounter for routine gynecological examination 09/12/2013   Rapid heart beat 10/10/2012   Routine general medical examination at a health care facility 07/21/2011   Vitamin D deficiency 08/04/2009   POSTMENOPAUSAL STATUS 08/04/2009   Controlled diabetes mellitus type 2 with complications ([place]) 27/25/3664   Hyperlipidemia associated with type 2 diabetes mellitus ([place]) 06/14/2008   [place] DISEASE, CERVICAL 03/30/2007   History of alcohol abuse 12/06/2006   Neuropathy of both feet 12/06/2006   DIVERTICULOSIS, COLON 12/06/2006   Fatty liver 12/06/2006   BREAST CANCER, HX OF 12/06/2006   Past Medical History:  Diagnosis Date   Alcohol abuse, unspecified    Breast cancer ([place]) 1998   Right   Cataract    left eye   Cervical spondylosis 2006   MRI   Degeneration of cervical intervertebral disc 2006   MRI   Diabetes mellitus without complication ([place])    Diverticulosis of colon  (without mention of hemorrhage)    Hyperpotassemia    Microscopic hematuria    Mononeuritis of unspecified site    Nonspecific abnormal results of liver function study    Other abnormal glucose    Other and unspecified hyperlipidemia    no per pt   Other chronic nonalcoholic liver disease    Personal history of chemotherapy    Personal history of malignant neoplasm of breast    Personal history of radiation therapy    Pneumothorax, acute    right, spontaneous   Tobacco use disorder    Unspecified vitamin  D deficiency    Past Surgical History:  Procedure Laterality Date   BIOPSY  07/27/2019   Procedure: BIOPSY;  Surgeon: Yetta Flock, MD;  Location: [facility];  Service: Gastroenterology;;   BREAST BIOPSY  9/03   Right   BREAST LUMPECTOMY Right 1998   CHEST TUBE INSERTION  11/02/2014   COLONOSCOPY     COLONOSCOPY WITH PROPOFOL N/A 07/27/2019   Procedure: COLONOSCOPY WITH PROPOFOL;  Surgeon: Yetta Flock, MD;  Location: [facility];  Service: Gastroenterology;  Laterality: N/A;   ESOPHAGOGASTRODUODENOSCOPY (EGD) WITH PROPOFOL N/A 10/30/2018   Procedure: ESOPHAGOGASTRODUODENOSCOPY (EGD) WITH PROPOFOL;  Surgeon: Mauri Pole, MD;  Location: [facility];  Service: Endoscopy;  Laterality: N/A;   ESOPHAGOGASTRODUODENOSCOPY (EGD) WITH PROPOFOL N/A 03/12/2019   Procedure: ESOPHAGOGASTRODUODENOSCOPY (EGD) WITH PROPOFOL;  Surgeon: Mauri Pole, MD;  Location: [facility];  Service: Endoscopy;  Laterality: N/A;   ESOPHAGOGASTRODUODENOSCOPY (EGD) WITH PROPOFOL N/A 07/27/2019   Procedure: ESOPHAGOGASTRODUODENOSCOPY (EGD) WITH PROPOFOL;  Surgeon: Yetta Flock, MD;  Location: [facility];  Service: Gastroenterology;  Laterality: N/A;   EYE SURGERY  02/2017   cataract extraction with lens implant-left   HOT HEMOSTASIS N/A 07/27/2019   Procedure: HOT HEMOSTASIS (ARGON PLASMA COAGULATION/BICAP);  Surgeon: Yetta Flock, MD;  Location: [facility];   Service: Gastroenterology;  Laterality: N/A;   MOUTH SURGERY     POLYPECTOMY  07/27/2019   Procedure: POLYPECTOMY;  Surgeon: Yetta Flock, MD;  Location: [facility];  Service: Gastroenterology;;   TUBAL LIGATION     Social History   Tobacco Use   Smoking status: Every Day    Packs/day: 1.00    Years: 53.00    Pack years: 53.00    Types: Cigarettes   Smokeless tobacco: Never   Tobacco comments:    Patient is engaged in health coaching for smoking cessation   Vaping Use   Vaping Use: Never used  Substance Use Topics   Alcohol use: Not Currently   Drug use: No   Family History  Problem Relation Age of Onset   Heart failure Father    Heart attack Father    Colon cancer Maternal Uncle    Stroke Mother    Esophageal cancer Neg Hx    Rectal cancer Neg Hx    Stomach cancer Neg Hx    Pancreatic cancer Neg Hx    Allergies  Allergen Reactions   Oxycodone Nausea And Vomiting   Crestor [Rosuvastatin]     Abdominal pain     Glipizide Other (See Comments)    Stomach pain   Current Outpatient Medications on File Prior to Visit  Medication Sig Dispense Refill   albuterol (VENTOLIN HFA) 108 (90 Base) MCG/ACT inhaler INHALE 2 PUFFS INTO THE LUNGS EVERY 4 HOURS AS NEEDED FOR WHEEZE OR FOR SHORTNESS OF BREATH 18 each 3   budesonide-formoterol (SYMBICORT) 160-4.5 MCG/ACT inhaler Inhale 2 puffs into the lungs 2 (two) times daily. 1 each 11   cholecalciferol (VITAMIN D3) 25 MCG (1000 UNIT) tablet Take 1,000 Units by mouth 2 (two) times daily.     clorazepate (TRANXENE) 3.75 MG tablet Take 1 tablet (3.75 mg total) by mouth 2 (two) times daily. 60 tablet 5   cyclobenzaprine (FLEXERIL) 10 MG tablet TAKE 1/2 TABLET BY MOUTH AT BEDTIME AS NEEDED FOR MUSCLE SPASMS 15 tablet 3   furosemide (LASIX) 20 MG tablet TAKE 1 TABLET BY MOUTH EVERY DAY 90 tablet 1   lactulose (CHRONULAC) 10 GM/15ML solution Take 15 mLs (10 g total)  by mouth 3 (three) times daily. 236 mL 11   metFORMIN  (GLUCOPHAGE-XR) 500 MG 24 hr tablet TAKE 1 TABLET BY MOUTH EVERY DAY WITH BREAKFAST 90 tablet 0   Multiple Vitamin (MULTIVITAMIN) tablet Take 1 tablet by mouth daily.     Multiple Vitamins-Minerals (PRESERVISION AREDS 2) CAPS Take 1 capsule by mouth 2 (two) times daily.     mupirocin ointment (BACTROBAN) 2 % APPLY 1 APPLICATION TOPICALLY 2 (TWO) TIMES DAILY. TO AFFECTED AREAS 22 g 2   nadolol (CORGARD) 20 MG tablet TAKE 1 TABLET BY MOUTH EVERY DAY 90 tablet 3   nicotine (NICODERM CQ - DOSED IN MG/24 HOURS) 21 mg/24hr patch Place 1 patch (21 mg total) onto the skin daily. 42 patch 0   nystatin (MYCOSTATIN) 100000 UNIT/ML suspension TAKE 5 MLS (500,000 UNITS TOTAL) BY MOUTH 3 (THREE) TIMES DAILY. SWISH AND SWALLOW 120 mL 0   pantoprazole (PROTONIX) 40 MG tablet TAKE 1 TABLET (40 MG TOTAL) BY MOUTH 2 (TWO) TIMES DAILY BEFORE A MEAL. 180 tablet 1   Polyethyl Glycol-Propyl Glycol (SYSTANE OP) Place 1 drop into the left eye daily as needed (dryness).     spironolactone (ALDACTONE) 25 MG tablet Take 1 tablet (25 mg total) by mouth daily. 30 tablet 1   SYSTANE COMPLETE 0.6 % SOLN Apply 1 drop to eye 2 (two) times daily.     temazepam (RESTORIL) 7.5 MG capsule TAKE 1 CAPSULE (7.5 MG TOTAL) BY MOUTH AT BEDTIME AS NEEDED FOR SLEEP. 30 capsule 3   traMADol (ULTRAM) 50 MG tablet      XIFAXAN 550 MG TABS tablet TAKE 1 TABLET BY MOUTH TWICE A DAY 60 tablet 3   No current facility-administered medications on file prior to visit.    Review of Systems  Constitutional:  Positive for fatigue. Negative for activity change, appetite change, fever and unexpected weight change.  HENT:  Negative for congestion, ear pain, rhinorrhea, sinus pressure and sore throat.   Eyes:  Negative for pain, redness and visual disturbance.  Respiratory:  Negative for cough, shortness of breath and wheezing.   Cardiovascular:  Negative for chest pain and palpitations.  Gastrointestinal:  Negative for abdominal pain, blood in stool,  constipation and diarrhea.  Endocrine: Negative for polydipsia and polyuria.  Genitourinary:  Negative for dysuria, frequency and urgency.  Musculoskeletal:  Positive for arthralgias. Negative for back pain and myalgias.  Skin:  Negative for pallor and rash.  Allergic/Immunologic: Negative for environmental allergies.  Neurological:  Negative for dizziness, syncope and headaches.  Hematological:  Negative for adenopathy. Does not bruise/bleed easily.  Psychiatric/Behavioral:  Negative for decreased concentration and dysphoric mood. The patient is not nervous/anxious.       Objective:   Physical Exam Constitutional:      General: She is not in acute distress.    Appearance: Normal appearance. She is well-developed. She is not ill-appearing or diaphoretic.     Comments: Under weight  HENT:     Head: Normocephalic and atraumatic.     Right Ear: Tympanic membrane, ear canal and external ear normal.     Left Ear: Tympanic membrane, ear canal and external ear normal.     Nose: Nose normal. No congestion.     Mouth/Throat:     Mouth: Mucous membranes are moist.     Pharynx: Oropharynx is clear. No posterior oropharyngeal erythema.  Eyes:     General: No scleral icterus.    Extraocular Movements: Extraocular movements intact.     Conjunctiva/sclera: Conjunctivae  normal.     Pupils: Pupils are equal, round, and reactive to light.  Neck:     Thyroid: No thyromegaly.     Vascular: No carotid bruit or JVD.  Cardiovascular:     Rate and Rhythm: Normal rate and regular rhythm.     Pulses: Normal pulses.     Heart sounds: Normal heart sounds.    No gallop.  Pulmonary:     Effort: Pulmonary effort is normal. No respiratory distress.     Breath sounds: Normal breath sounds. No wheezing.     Comments: Good air exch Chest:     Chest wall: No tenderness.  Abdominal:     General: Bowel sounds are normal. There is no distension or abdominal bruit.     Palpations: Abdomen is soft. There is no  mass.     Tenderness: There is no abdominal tenderness.     Hernia: No hernia is present.  Genitourinary:    Comments: Breast exam: No mass, nodules, thickening, tenderness, bulging, retraction, inflamation, nipple discharge or skin changes noted.  No axillary or clavicular LA.     Baseline surgical changes in R breast  Musculoskeletal:        General: No tenderness. Normal range of motion.     Cervical back: Normal range of motion and neck supple. No rigidity. No muscular tenderness.     Right lower leg: No edema.     Left lower leg: No edema.     Comments: No kyphosis   Lymphadenopathy:     Cervical: No cervical adenopathy.  Skin:    General: Skin is warm and dry.     Coloration: Skin is not pale.     Findings: No erythema or rash.  Neurological:     Mental Status: She is alert. Mental status is at baseline.     Cranial Nerves: No cranial nerve deficit.     Motor: No abnormal muscle tone.     Coordination: Coordination normal.     Gait: Gait normal.     Deep Tendon Reflexes: Reflexes are normal and symmetric. Reflexes normal.  Psychiatric:        Mood and Affect: Mood normal.     Comments: Mood is good          Assessment & Plan:   Problem List Items Addressed This Visit       Cardiovascular and Mediastinum   Esophageal varices without bleeding (HCC)    Continues nadolol and GI f/u  Still anemic  No melena or hematemesis  Continues etoh avoidance       Aortic atherosclerosis ([facility]CC)    Pt did not tolerate statin  bp is good DM in control No clinical changes Continue to monitor       AVM (arteriovenous malformation) of colon    Still anemic and tired  Getting iron infusion soon Last colonoscopy 4/21 Planning f/u with GI         Digestive   Cirrhosis of liver ([place])    Has quit etoh LFTs in nl range  Continues tx for esoph varicies and ascites Followed by GI         Endocrine   Hyperlipidemia associated with type 2 diabetes mellitus ([place])     Tried crestor but it caused R sided abd pain  Disc goals for lipids and reasons to control them Rev last labs with pt Rev low sat fat diet in detail Watching diet  LDL of 81  Controlled diabetes mellitus type 2 with complications ([place])    Lab Results  Component Value Date   HGBA1C 6.4 11/07/2020  Challenge to get more protein (needs calories) without sugar  Continues metformin xr  Intolerant to statin  LDL is 82         Nervous and Auditory   Neuropathy of both feet    Nl exam today         Other   Vitamin D deficiency    Vitamin D level is therapeutic with current supplementation Disc importance of this to bone and overall health Level is in the 90s        Routine general medical examination at a health care facility - Primary    Reviewed health habits including diet and exercise and skin cancer prevention Reviewed appropriate screening tests for age  Also reviewed health mt list, fam hx and immunization status , as well as social and family history   See HPI Labs reviewed  Enc covid booster #2 Also flu shot in the fall Plans to check on coverage of shingrix Will plan dexa in a year (last nl) with no falls or fx and therapeutic vit D level  Goal to get some more exercise as tolerated Discussed smoking cessation-not ready         Smoker    Disc in detail risks of smoking and possible outcomes including copd, vascular/ heart disease, cancer , respiratory and sinus infections  Pt voices understanding She quit for 3 wk and started back Not ready to quit again but has patches when she does  Refilled inhalers Is in the lung cancer screening program       Malnutrition ([place])    Reviewed diet  Supplementing protein drinks Enc to get these in addn to regular meals  Difficult with low appetite Trying to quit smoking again may help       Iron deficiency anemia due to chronic blood loss    Under care of hematology  Hb 8.4 recently  Getting set up for  iron infusions since she does not tolerate oral iron   H/o colon avm and esoph varices that bleed

## 2020-11-12 NOTE — Assessment & Plan Note (Signed)
Lab Results  Component Value Date   HGBA1C 6.4 11/07/2020   Challenge to get more protein (needs calories) without sugar  Continues metformin xr  Intolerant to statin  LDL is 82

## 2020-11-12 NOTE — Assessment & Plan Note (Signed)
Pt did not tolerate statin  bp is good DM in control No clinical changes Continue to monitor

## 2020-11-12 NOTE — Assessment & Plan Note (Signed)
Nl exam today

## 2020-11-12 NOTE — Telephone Encounter (Signed)
501 pm.  Phone call made to patient to follow up on overall condition.  Patient states she is doing well right now.  She has no concerns and did not feel a home visit is needed.  Patient welcomes phone check-ins every 2-3 months from NP.  Advised that I would notify NP of phone checks ins.

## 2020-11-12 NOTE — Assessment & Plan Note (Signed)
Has quit etoh LFTs in nl range  Continues tx for esoph varicies and ascites Followed by GI

## 2020-11-12 NOTE — Assessment & Plan Note (Signed)
Disc in detail risks of smoking and possible outcomes including copd, vascular/ heart disease, cancer , respiratory and sinus infections  Pt voices understanding She quit for 3 wk and started back Not ready to quit again but has patches when she does  Refilled inhalers Is in the lung cancer screening program

## 2020-11-12 NOTE — Assessment & Plan Note (Signed)
Reviewed health habits including diet and exercise and skin cancer prevention Reviewed appropriate screening tests for age  Also reviewed health mt list, fam hx and immunization status , as well as social and family history   See HPI Labs reviewed  Enc covid booster #2 Also flu shot in the fall Plans to check on coverage of shingrix Will plan dexa in a year (last nl) with no falls or fx and therapeutic vit D level  Goal to get some more exercise as tolerated Discussed smoking cessation-not ready

## 2020-11-12 NOTE — Assessment & Plan Note (Signed)
Under care of hematology  Hb 8.4 recently  Getting set up for iron infusions since she does not tolerate oral iron   H/o colon avm and esoph varices that bleed

## 2020-11-12 NOTE — Assessment & Plan Note (Signed)
Still anemic and tired  Getting iron infusion soon Last colonoscopy 4/21 Planning f/u with GI

## 2020-11-17 ENCOUNTER — Telehealth: Payer: Self-pay | Admitting: Gastroenterology

## 2020-11-17 ENCOUNTER — Other Ambulatory Visit: Payer: Self-pay

## 2020-11-17 DIAGNOSIS — K760 Fatty (change of) liver, not elsewhere classified: Secondary | ICD-10-CM

## 2020-11-17 NOTE — Telephone Encounter (Signed)
Christine Barton says she is due an MRI for follow up.  She is getting iron infusions this month. I have set her up for a follow up appointment. Please advise on the MRI.

## 2020-11-17 NOTE — Telephone Encounter (Signed)
Its better to get MRI prior to iron infusion or we should wait atleast for 1 month after IV iron . Thanks

## 2020-11-17 NOTE — Telephone Encounter (Signed)
Order entered. Last MRI was done at [place]. Will get scheduled at the same.

## 2020-11-18 ENCOUNTER — Other Ambulatory Visit: Payer: Self-pay

## 2020-11-18 ENCOUNTER — Inpatient Hospital Stay: Payer: Medicare HMO | Attending: Oncology

## 2020-11-18 VITALS — BP 118/56 | HR 75 | Temp 96.6°F | Resp 18

## 2020-11-18 DIAGNOSIS — D500 Iron deficiency anemia secondary to blood loss (chronic): Secondary | ICD-10-CM

## 2020-11-18 DIAGNOSIS — D509 Iron deficiency anemia, unspecified: Secondary | ICD-10-CM | POA: Diagnosis not present

## 2020-11-18 DIAGNOSIS — K5520 Angiodysplasia of colon without hemorrhage: Secondary | ICD-10-CM

## 2020-11-18 MED ORDER — SODIUM CHLORIDE 0.9 % IV SOLN
300.0000 mg | Freq: Once | INTRAVENOUS | Status: AC
  Administered 2020-11-18: 300 mg via INTRAVENOUS
  Filled 2020-11-18: qty 15

## 2020-11-18 MED ORDER — SODIUM CHLORIDE 0.9 % IV SOLN
Freq: Once | INTRAVENOUS | Status: AC
  Filled 2020-11-18: qty 250

## 2020-11-18 MED ORDER — IRON SUCROSE 20 MG/ML IV SOLN: 200.0000 mg | Freq: Once | INTRAVENOUS | Status: DC

## 2020-11-18 NOTE — Patient Instructions (Signed)
[facility]   Discharge Instructions: Thank you for choosing [facility] to provide your oncology and hematology care.  If you have a lab appointment with the [facility], please go directly to the [facility] and check in at the registration area.  We strive to give you quality time with your provider. You may need to reschedule your appointment if you arrive late (15 or more minutes).  Arriving late affects you and other patients whose appointments are after yours.  Also, if you miss three or more appointments without notifying the office, you may be dismissed from the clinic at the provider's discretion.      For prescription refill requests, have your pharmacy contact our office and allow 72 hours for refills to be completed.    BELOW ARE SYMPTOMS THAT SHOULD BE REPORTED IMMEDIATELY: *FEVER GREATER THAN 100.4 F (38 C) OR HIGHER *CHILLS OR SWEATING *NAUSEA AND VOMITING THAT IS NOT CONTROLLED WITH YOUR NAUSEA MEDICATION *UNUSUAL SHORTNESS OF BREATH *UNUSUAL BRUISING OR BLEEDING *URINARY PROBLEMS (pain or burning when urinating, or frequent urination) *BOWEL PROBLEMS (unusual diarrhea, constipation, pain near the anus) TENDERNESS IN MOUTH AND THROAT WITH OR WITHOUT PRESENCE OF ULCERS (sore throat, sores in mouth, or a toothache) UNUSUAL RASH, SWELLING OR PAIN  UNUSUAL VAGINAL DISCHARGE OR ITCHING   Items with * indicate a potential emergency and should be followed up as soon as possible or go to the Emergency Department if any problems should occur.  Please show the CHEMOTHERAPY ALERT CARD or IMMUNOTHERAPY ALERT CARD at check-in to the Emergency Department and triage nurse.  Should you have questions after your visit or need to cancel or reschedule your appointment, please contact [facility]  [phone] and follow the prompts.  Office hours are 8:00 a.m. to 4:30 p.m. Monday - Friday. Please note  that voicemails left after 4:00 p.m. may not be returned until the following business day.  We are closed weekends and major holidays. You have access to a nurse at all times for urgent questions. Please call the main number to the clinic [phone] and follow the prompts.  For any non-urgent questions, you may also contact your provider using MyChart. We now offer e-Visits for anyone 70 and older to request care online for non-urgent symptoms. For details visit [email or web address].   Also download the MyChart app! Go to the app store, search "MyChart", open the app, select [facility], and log in with your MyChart username and password.  Due to Covid, a mask is required upon entering the hospital/clinic. If you do not have a mask, one will be given to you upon arrival. For doctor visits, patients may have 1 support person aged 70 or older with them. For treatment visits, patients cannot have anyone with them due to current Covid guidelines and our immunocompromised population.   Iron Sucrose injection What is this medication? IRON SUCROSE (AHY ern SOO krohs) is an iron complex. Iron is used to make healthy red blood cells, which carry oxygen and nutrients throughout the body. This medicine is used to treat iron deficiency anemia in people with chronickidney disease. This medicine may be used for other purposes; ask your health care provider orpharmacist if you have questions. COMMON BRAND NAME(S): Venofer What should I tell my care team before I take this medication? They need to know if you have any of these conditions: anemia not caused by low iron levels heart disease high levels of iron  in the blood kidney disease liver disease an unusual or allergic reaction to iron, other medicines, foods, dyes, or preservatives pregnant or trying to get pregnant breast-feeding How should I use this medication? This medicine is for infusion into a vein. It is given by a health careprofessional in  a hospital or clinic setting. Talk to your pediatrician regarding the use of this medicine in children. While this drug may be prescribed for children as young as 2 years for selectedconditions, precautions do apply. Overdosage: If you think you have taken too much of this medicine contact apoison control center or emergency room at once. NOTE: This medicine is only for you. Do not share this medicine with others. What if I miss a dose? It is important not to miss your dose. Call your doctor or health careprofessional if you are unable to keep an appointment. What may interact with this medication? Do not take this medicine with any of the following medications: deferoxamine dimercaprol other iron products This medicine may also interact with the following medications: chloramphenicol deferasirox This list may not describe all possible interactions. Give your health care provider a list of all the medicines, herbs, non-prescription drugs, or dietary supplements you use. Also tell them if you smoke, drink alcohol, or use illegaldrugs. Some items may interact with your medicine. What should I watch for while using this medication? Visit your doctor or healthcare professional regularly. Tell your doctor or healthcare professional if your symptoms do not start to get better or if theyget worse. You may need blood work done while you are taking this medicine. You may need to follow a special diet. Talk to your doctor. Foods that contain iron include: whole grains/cereals, dried fruits, beans, or peas, leafy greenvegetables, and organ meats (liver, kidney). What side effects may I notice from receiving this medication? Side effects that you should report to your doctor or health care professionalas soon as possible: allergic reactions like skin rash, itching or hives, swelling of the face, lips, or tongue breathing problems changes in blood pressure cough fast, irregular heartbeat feeling faint or  lightheaded, falls fever or chills flushing, sweating, or hot feelings joint or muscle aches/pains seizures swelling of the ankles or feet unusually weak or tired Side effects that usually do not require medical attention (report to yourdoctor or health care professional if they continue or are bothersome): diarrhea feeling achy headache irritation at site where injected nausea, vomiting stomach upset tiredness This list may not describe all possible side effects. Call your doctor for medical advice about side effects. You may report side effects to FDA at1-800-FDA-1088. Where should I keep my medication? This drug is given in a hospital or clinic and will not be stored at home. NOTE: This sheet is a summary. It may not cover all possible information. If you have questions about this medicine, talk to your doctor, pharmacist, orhealth care provider.  2022 Elsevier/Gold Standard (2011-01-07 17:14:35)

## 2020-11-18 NOTE — Progress Notes (Signed)
Pt received IV venofer in clinic today. Tolerated well. VSS @ d/c.

## 2020-11-24 NOTE — Progress Notes (Signed)
Contacted patient regarding her concern for documentation of refusal for MMSE exception documentation. Patient stated she did not refuse the cognitive assessment but instead was told it was not a requirement. She did not want the refusal to negatively affect her in any way and wanted to correct the documentation.   6CIT completed today with score of 0. Normal cognitive status assessed by direct observation by this Nurse Health Advisor. No abnormalities found.   Patient also inquired about "some recent data might be hidden" under Activities of Daily Living survey questions. Patient advised this was for note documentation only to provide answers to questions from current encounter rather than include all previous ADL survey questions which can be located in other encounters within chart.   Patient appreciative of call and update to chart for Medicare AWV.   Clemetine Marker LPN Nurse Health Advisor

## 2020-11-25 ENCOUNTER — Inpatient Hospital Stay: Payer: Medicare HMO

## 2020-11-25 ENCOUNTER — Other Ambulatory Visit: Payer: Self-pay

## 2020-11-25 VITALS — BP 106/90 | HR 74 | Temp 98.1°F | Resp 20

## 2020-11-25 DIAGNOSIS — D500 Iron deficiency anemia secondary to blood loss (chronic): Secondary | ICD-10-CM

## 2020-11-25 DIAGNOSIS — K5520 Angiodysplasia of colon without hemorrhage: Secondary | ICD-10-CM

## 2020-11-25 DIAGNOSIS — D509 Iron deficiency anemia, unspecified: Secondary | ICD-10-CM | POA: Diagnosis not present

## 2020-11-25 MED ORDER — SODIUM CHLORIDE 0.9 % IV SOLN
300.0000 mg | Freq: Once | INTRAVENOUS | Status: AC
  Administered 2020-11-25: 300 mg via INTRAVENOUS
  Filled 2020-11-25: qty 15

## 2020-11-25 MED ORDER — SODIUM CHLORIDE 0.9 % IV SOLN
Freq: Once | INTRAVENOUS | Status: AC
  Filled 2020-11-25: qty 250

## 2020-11-25 NOTE — Patient Instructions (Signed)
[facility]  Discharge Instructions: Thank you for choosing [facility] to provide your oncology and hematology care.  If you have a lab appointment with the [facility], please go directly to the [facility] and check in at the registration area.  Wear comfortable clothing and clothing appropriate for easy access to any Portacath or PICC line.   We strive to give you quality time with your provider. You may need to reschedule your appointment if you arrive late (15 or more minutes).  Arriving late affects you and other patients whose appointments are after yours.  Also, if you miss three or more appointments without notifying the office, you may be dismissed from the clinic at the provider's discretion.      For prescription refill requests, have your pharmacy contact our office and allow 72 hours for refills to be completed.    Today you received the following chemotherapy and/or immunotherapy agents venofer       To help prevent nausea and vomiting after your treatment, we encourage you to take your nausea medication as directed.  BELOW ARE SYMPTOMS THAT SHOULD BE REPORTED IMMEDIATELY: *FEVER GREATER THAN 100.4 F (38 C) OR HIGHER *CHILLS OR SWEATING *NAUSEA AND VOMITING THAT IS NOT CONTROLLED WITH YOUR NAUSEA MEDICATION *UNUSUAL SHORTNESS OF BREATH *UNUSUAL BRUISING OR BLEEDING *URINARY PROBLEMS (pain or burning when urinating, or frequent urination) *BOWEL PROBLEMS (unusual diarrhea, constipation, pain near the anus) TENDERNESS IN MOUTH AND THROAT WITH OR WITHOUT PRESENCE OF ULCERS (sore throat, sores in mouth, or a toothache) UNUSUAL RASH, SWELLING OR PAIN  UNUSUAL VAGINAL DISCHARGE OR ITCHING   Items with * indicate a potential emergency and should be followed up as soon as possible or go to the Emergency Department if any problems should occur.  Please show the CHEMOTHERAPY ALERT CARD or IMMUNOTHERAPY ALERT CARD at check-in  to the Emergency Department and triage nurse.  Should you have questions after your visit or need to cancel or reschedule your appointment, please contact [facility]  [phone] and follow the prompts.  Office hours are 8:00 a.m. to 4:30 p.m. Monday - Friday. Please note that voicemails left after 4:00 p.m. may not be returned until the following business day.  We are closed weekends and major holidays. You have access to a nurse at all times for urgent questions. Please call the main number to the clinic [phone] and follow the prompts.  For any non-urgent questions, you may also contact your provider using MyChart. We now offer e-Visits for anyone 38 and older to request care online for non-urgent symptoms. For details visit [email or web address].   Also download the MyChart app! Go to the app store, search "MyChart", open the app, select [facility], and log in with your MyChart username and password.  Due to Covid, a mask is required upon entering the hospital/clinic. If you do not have a mask, one will be given to you upon arrival. For doctor visits, patients may have 1 support person aged 24 or older with them. For treatment visits, patients cannot have anyone with them due to current Covid guidelines and our immunocompromised population.   Iron Sucrose injection What is this medication? IRON SUCROSE (AHY ern SOO krohs) is an iron complex. Iron is used to make healthy red blood cells, which carry oxygen and nutrients throughout the body. This medicine is used to treat iron deficiency anemia in people with chronickidney disease. This medicine may be used for  other purposes; ask your health care provider orpharmacist if you have questions. COMMON BRAND NAME(S): Venofer What should I tell my care team before I take this medication? They need to know if you have any of these conditions: anemia not caused by low iron levels heart disease high levels of  iron in the blood kidney disease liver disease an unusual or allergic reaction to iron, other medicines, foods, dyes, or preservatives pregnant or trying to get pregnant breast-feeding How should I use this medication? This medicine is for infusion into a vein. It is given by a health careprofessional in a hospital or clinic setting. Talk to your pediatrician regarding the use of this medicine in children. While this drug may be prescribed for children as young as 2 years for selectedconditions, precautions do apply. Overdosage: If you think you have taken too much of this medicine contact apoison control center or emergency room at once. NOTE: This medicine is only for you. Do not share this medicine with others. What if I miss a dose? It is important not to miss your dose. Call your doctor or health careprofessional if you are unable to keep an appointment. What may interact with this medication? Do not take this medicine with any of the following medications: deferoxamine dimercaprol other iron products This medicine may also interact with the following medications: chloramphenicol deferasirox This list may not describe all possible interactions. Give your health care provider a list of all the medicines, herbs, non-prescription drugs, or dietary supplements you use. Also tell them if you smoke, drink alcohol, or use illegaldrugs. Some items may interact with your medicine. What should I watch for while using this medication? Visit your doctor or healthcare professional regularly. Tell your doctor or healthcare professional if your symptoms do not start to get better or if theyget worse. You may need blood work done while you are taking this medicine. You may need to follow a special diet. Talk to your doctor. Foods that contain iron include: whole grains/cereals, dried fruits, beans, or peas, leafy greenvegetables, and organ meats (liver, kidney). What side effects may I notice from  receiving this medication? Side effects that you should report to your doctor or health care professionalas soon as possible: allergic reactions like skin rash, itching or hives, swelling of the face, lips, or tongue breathing problems changes in blood pressure cough fast, irregular heartbeat feeling faint or lightheaded, falls fever or chills flushing, sweating, or hot feelings joint or muscle aches/pains seizures swelling of the ankles or feet unusually weak or tired Side effects that usually do not require medical attention (report to yourdoctor or health care professional if they continue or are bothersome): diarrhea feeling achy headache irritation at site where injected nausea, vomiting stomach upset tiredness This list may not describe all possible side effects. Call your doctor for medical advice about side effects. You may report side effects to FDA at1-800-FDA-1088. Where should I keep my medication? This drug is given in a hospital or clinic and will not be stored at home. NOTE: This sheet is a summary. It may not cover all possible information. If you have questions about this medicine, talk to your doctor, pharmacist, orhealth care provider.  2022 Elsevier/Gold Standard (2011-01-07 17:14:35)

## 2020-11-26 ENCOUNTER — Telehealth: Payer: Self-pay

## 2020-11-26 NOTE — Telephone Encounter (Signed)
Patient complains of a constant ache in her RUQ under the breast area. She complains that it sometimes is very sharp and is relieved by taking a dose of tylenol. She is constantly aware of it because it is always a discomfort. The intense pain comes and goes. When it is sharp, she can pinpoint the area. It is otherwise generalized. It is not associated with food or any activity.

## 2020-11-27 NOTE — Telephone Encounter (Signed)
She is already scheduled for MRI liver, we will follow-up the results.  Please advise patient to use topical analgesics/Bengay as needed to relieve the discomfort.  Schedule follow-up visit next available appointment.  Thank you

## 2020-11-27 NOTE — Telephone Encounter (Signed)
Patient instructed. Agrees to this plan.

## 2020-12-02 ENCOUNTER — Inpatient Hospital Stay: Payer: Medicare HMO

## 2020-12-02 VITALS — BP 105/52 | HR 74 | Temp 96.5°F | Resp 16

## 2020-12-02 DIAGNOSIS — D500 Iron deficiency anemia secondary to blood loss (chronic): Secondary | ICD-10-CM

## 2020-12-02 DIAGNOSIS — D509 Iron deficiency anemia, unspecified: Secondary | ICD-10-CM | POA: Diagnosis not present

## 2020-12-02 DIAGNOSIS — K5520 Angiodysplasia of colon without hemorrhage: Secondary | ICD-10-CM

## 2020-12-02 MED ORDER — SODIUM CHLORIDE 0.9 % IV SOLN
300.0000 mg | Freq: Once | INTRAVENOUS | Status: AC
  Administered 2020-12-02: 300 mg via INTRAVENOUS
  Filled 2020-12-02: qty 15

## 2020-12-02 MED ORDER — SODIUM CHLORIDE 0.9 % IV SOLN
Freq: Once | INTRAVENOUS | Status: AC
  Filled 2020-12-02: qty 250

## 2020-12-02 NOTE — Patient Instructions (Signed)

## 2020-12-04 ENCOUNTER — Other Ambulatory Visit: Payer: Self-pay | Admitting: Gastroenterology

## 2020-12-09 ENCOUNTER — Other Ambulatory Visit: Payer: Self-pay | Admitting: Gastroenterology

## 2020-12-19 NOTE — Progress Notes (Signed)
[facility] OFFICE PROGRESS NOTE  Tower, [name], MD [place] [id number]  DIAGNOSIS: Anemia secondary to GI occult blood loss.  PRIOR THERAPY: Oral iron supplements. Discontinued due to intolerance (severe abdominal pain)  CURRENT THERAPY:  IV iron infusions with venofer PRN. Last infusion on 12/02/20  INTERVAL HISTORY: Christine Barton is a 70 y.o. female who returns to the clinic today for a follow-up visit. The patient is feeling well today without any concerning complaints. Her last iron infusion was on 12/02/20. The patient does not take oral iron supplements due to it causing severe abdominal pain per patient report. The patient's iron deficiency anemia is likely secondary to some esophageal varices as well as AVMs. She states that she has been told she bleeds chronically but she has not noticed any visible blood lately. She does endorse dark and tarry stools which she says is her baseline. She denies any other abnormal bleeding or bruising to her knowledge including epistaxis, gingival bleeding, hematemesis, hemoptysis, melena, hematochezia, or hematuria. The patient is followed closely by her gastroenterologist secondary to her cirrhosis and has her next appointment with them on 01/08/21.   She has been having some LUQ pain, left lower rib pain and left breast pain for the past few months. GI is planning on performing a liver MRI on 12/31/20. She reports that the pain is primarily below her left breast, radiates to her back and is relatively constant during the day while improved at night and with heating pads and lidocaine patches. She reports baseline SOB that has not worsened and denies other chest pain. She does report that she has been coughing more often recently, cough is not productive. Denies any nausea, vomiting or diarrhea. She reports some occasional constipation that is well-managed with lactulose. Denies any urinary symptoms.   The patient  reports feeling well other than abdominal/lower rib pain. She reports an increase in energy since her last iron infusion. Reports a good appetite and denies weight loss. Denies any fever, sweats or chills. She denies any lightheadedness or dizziness. The patient is here today for routine follow-up and evaluation of labs.  MEDICAL HISTORY: Past Medical History:  Diagnosis Date   Alcohol abuse, unspecified    Breast cancer ([facility]) 1998   Right   Cataract    left eye   Cervical spondylosis 2006   MRI   Degeneration of cervical intervertebral disc 2006   MRI   Diabetes mellitus without complication (HCC)    Diverticulosis of colon (without mention of hemorrhage)    Hyperpotassemia    Microscopic hematuria    Mononeuritis of unspecified site    Nonspecific abnormal results of liver function study    Other abnormal glucose    Other and unspecified hyperlipidemia    no per pt   Other chronic nonalcoholic liver disease    Personal history of chemotherapy    Personal history of malignant neoplasm of breast    Personal history of radiation therapy    Pneumothorax, acute    right, spontaneous   Tobacco use disorder    Unspecified vitamin D deficiency     ALLERGIES:  is allergic to oxycodone, crestor [rosuvastatin], and glipizide.  MEDICATIONS:  Current Outpatient Medications  Medication Sig Dispense Refill   albuterol (VENTOLIN HFA) 108 (90 Base) MCG/ACT inhaler INHALE 2 PUFFS INTO THE LUNGS EVERY 4 HOURS AS NEEDED FOR WHEEZE OR FOR SHORTNESS OF BREATH 18 each 3   budesonide-formoterol (SYMBICORT) 160-4.5 MCG/ACT  inhaler Inhale 2 puffs into the lungs 2 (two) times daily. 1 each 11   cholecalciferol (VITAMIN D3) 25 MCG (1000 UNIT) tablet Take 1,000 Units by mouth 2 (two) times daily.     clorazepate (TRANXENE) 3.75 MG tablet Take 1 tablet (3.75 mg total) by mouth 2 (two) times daily. 60 tablet 5   cyclobenzaprine (FLEXERIL) 10 MG tablet TAKE 1/2 TABLET BY MOUTH AT BEDTIME AS NEEDED FOR  MUSCLE SPASMS 15 tablet 3   furosemide (LASIX) 20 MG tablet TAKE 1 TABLET BY MOUTH EVERY DAY 90 tablet 1   lactulose (CHRONULAC) 10 GM/15ML solution Take 15 mLs (10 g total) by mouth 3 (three) times daily. 236 mL 11   metFORMIN (GLUCOPHAGE-XR) 500 MG 24 hr tablet TAKE 1 TABLET BY MOUTH EVERY DAY WITH BREAKFAST 90 tablet 0   Multiple Vitamin (MULTIVITAMIN) tablet Take 1 tablet by mouth daily.     Multiple Vitamins-Minerals (PRESERVISION AREDS 2) CAPS Take 1 capsule by mouth 2 (two) times daily.     mupirocin ointment (BACTROBAN) 2 % APPLY 1 APPLICATION TOPICALLY 2 (TWO) TIMES DAILY. TO AFFECTED AREAS 22 g 2   nadolol (CORGARD) 20 MG tablet TAKE 1 TABLET BY MOUTH EVERY DAY 90 tablet 3   nicotine (NICODERM CQ - DOSED IN MG/24 HOURS) 21 mg/24hr patch Place 1 patch (21 mg total) onto the skin daily. 42 patch 0   nystatin (MYCOSTATIN) 100000 UNIT/ML suspension TAKE 5 MLS (500,000 UNITS TOTAL) BY MOUTH 3 (THREE) TIMES DAILY. SWISH AND SWALLOW 120 mL 0   pantoprazole (PROTONIX) 40 MG tablet TAKE 1 TABLET (40 MG TOTAL) BY MOUTH 2 (TWO) TIMES DAILY BEFORE A MEAL. 180 tablet 1   Polyethyl Glycol-Propyl Glycol (SYSTANE OP) Place 1 drop into the left eye daily as needed (dryness).     spironolactone (ALDACTONE) 25 MG tablet TAKE 1 TABLET BY MOUTH EVERY DAY 30 tablet 1   spironolactone (ALDACTONE) 50 MG tablet TAKE 1 TABLET BY MOUTH TWICE A DAY 180 tablet 3   SYSTANE COMPLETE 0.6 % SOLN Apply 1 drop to eye 2 (two) times daily.     temazepam (RESTORIL) 7.5 MG capsule TAKE 1 CAPSULE (7.5 MG TOTAL) BY MOUTH AT BEDTIME AS NEEDED FOR SLEEP. 30 capsule 3   traMADol (ULTRAM) 50 MG tablet      XIFAXAN 550 MG TABS tablet TAKE 1 TABLET BY MOUTH TWICE A DAY 60 tablet 3   No current facility-administered medications for this visit.    SURGICAL HISTORY:  Past Surgical History:  Procedure Laterality Date   BIOPSY  07/27/2019   Procedure: BIOPSY;  Surgeon: Yetta Flock, MD;  Location: [facility];  Service:  Gastroenterology;;   BREAST BIOPSY  9/03   Right   BREAST LUMPECTOMY Right 1998   CHEST TUBE INSERTION  11/02/2014   COLONOSCOPY     COLONOSCOPY WITH PROPOFOL N/A 07/27/2019   Procedure: COLONOSCOPY WITH PROPOFOL;  Surgeon: Yetta Flock, MD;  Location: [facility];  Service: Gastroenterology;  Laterality: N/A;   ESOPHAGOGASTRODUODENOSCOPY (EGD) WITH PROPOFOL N/A 10/30/2018   Procedure: ESOPHAGOGASTRODUODENOSCOPY (EGD) WITH PROPOFOL;  Surgeon: Mauri Pole, MD;  Location: [facility];  Service: Endoscopy;  Laterality: N/A;   ESOPHAGOGASTRODUODENOSCOPY (EGD) WITH PROPOFOL N/A 03/12/2019   Procedure: ESOPHAGOGASTRODUODENOSCOPY (EGD) WITH PROPOFOL;  Surgeon: Mauri Pole, MD;  Location: [facility];  Service: Endoscopy;  Laterality: N/A;   ESOPHAGOGASTRODUODENOSCOPY (EGD) WITH PROPOFOL N/A 07/27/2019   Procedure: ESOPHAGOGASTRODUODENOSCOPY (EGD) WITH PROPOFOL;  Surgeon: Yetta Flock, MD;  Location: [facility];  Service: Gastroenterology;  Laterality: N/A;   EYE SURGERY  02/2017   cataract extraction with lens implant-left   HOT HEMOSTASIS N/A 07/27/2019   Procedure: HOT HEMOSTASIS (ARGON PLASMA COAGULATION/BICAP);  Surgeon: Yetta Flock, MD;  Location: [facility];  Service: Gastroenterology;  Laterality: N/A;   MOUTH SURGERY     POLYPECTOMY  07/27/2019   Procedure: POLYPECTOMY;  Surgeon: Yetta Flock, MD;  Location: [facility];  Service: Gastroenterology;;   TUBAL LIGATION      REVIEW OF SYSTEMS:   Review of Systems  Constitutional: Negative for appetite change, chills, fatigue, fever and unexpected weight change.  HENT:   Negative for mouth sores, nosebleeds, sore throat and trouble swallowing.   Eyes: Negative for eye problems and icterus.  Respiratory: Positive for cough and baseline shortness of breath. Positive for left lower rib pain. Negative for hemoptysis and wheezing.   Cardiovascular: Negative for chest pain and leg swelling.   Gastrointestinal: Positive for LUQ pain and constipation. Negative for diarrhea, nausea and vomiting.  Genitourinary: Negative for bladder incontinence, difficulty urinating, dysuria, frequency and hematuria.   Musculoskeletal: Negative for back pain, gait problem, neck pain and neck stiffness.  Skin: Negative for itching and rash.  Neurological: Negative for dizziness, extremity weakness, gait problem, headaches, light-headedness and seizures.  Hematological: Negative for adenopathy. Does not bruise/bleed easily.  Psychiatric/Behavioral: Negative for confusion, depression and sleep disturbance. The patient is not nervous/anxious.     PHYSICAL EXAMINATION:  Blood pressure 121/64, pulse 78, temperature (!) 97.2 F (36.2 C), temperature source Tympanic, resp. rate 18, height 5' 4.5" (1.638 m), weight 109 lb 9.6 oz (49.7 kg), SpO2 100 %.  ECOG PERFORMANCE STATUS: 1  Physical Exam  Constitutional: Oriented to person, place, and time and well-developed, well-nourished, and in no distress.  HENT:  Head: Normocephalic and atraumatic.  Mouth/Throat: Oropharynx is clear and moist. No oropharyngeal exudate.  Eyes: Conjunctivae are normal. Right eye exhibits no discharge. Left eye exhibits no discharge. No scleral icterus.  Neck: Normal range of motion. Neck supple.  Cardiovascular: Normal rate, regular rhythm, normal heart sounds and intact distal pulses.   Pulmonary/Chest: Effort normal. Diffuse wheezing heard on ausculation. No respiratory distress. No rales. Tenderness to palpation over left lower ribs. Abdominal: Soft. Bowel sounds are normal. Exhibits no distension and no mass. Tenderness to palpation of epigastric area. Musculoskeletal: Normal range of motion. Exhibits no edema.  Lymphadenopathy:    No cervical adenopathy.  Neurological: Alert and oriented to person, place, and time. Exhibits normal muscle tone. Gait normal. Coordination normal.  Skin: Skin is warm and dry. No rash noted.  Not diaphoretic. No erythema. No pallor.  Psychiatric: Mood, memory and judgment normal.  Breast: No palpable breast masses. No overlying skin changes. History of lumpectomy on right breast.  Vitals reviewed.  LABORATORY DATA: Lab Results  Component Value Date   WBC 6.3 12/25/2020   HGB 9.8 (L) 12/25/2020   HCT 31.8 (L) 12/25/2020   MCV 84.8 12/25/2020   PLT 150 12/25/2020      Chemistry      Component Value Date/Time   NA 137 11/07/2020 0814   NA 140 12/11/2019 1617   K 4.2 11/07/2020 0814   CL 100 11/07/2020 0814   CO2 29 11/07/2020 0814   BUN 16 11/07/2020 0814   BUN 35 (H) 12/11/2019 1617   CREATININE 0.82 11/07/2020 0814      Component Value Date/Time   CALCIUM 9.6 11/07/2020 0814   ALKPHOS 77 11/07/2020 0814  AST 17 11/07/2020 0814   ALT 8 11/07/2020 0814   BILITOT 0.6 11/07/2020 0814   BILITOT 0.7 12/11/2019 1617       RADIOGRAPHIC STUDIES:  No results found.   ASSESSMENT/PLAN:  This is a very pleasant 70 year old Caucasian female with iron deficiency anemia likely secondary to occult GI blood loss.   The patient had a repeat CBC, iron studies, and ferritin performed today.  The patient's CBC demonstrated a Hbg of 9.8 compared to 8.4 2 months ago. Her MCV is 84.8. The patient's iron studies and ferritin are still pending at this time.   I will continue to monitor lab values and arrange for IV iron transfusion if necessary. I will call her tomorrow once I have the results of her iron studies to arrange follow up based on her lab studies.   Since she is unable to tolerate oral iron supplements, patient was encouraged to eat an iron-rich diet.   She will continue to follow closely with her gastroenterologist regarding her cirrhosis. She has a liver MRI scheduled for 12/31/20 and a f/u appointment scheduled for 01/08/21. She is advised to follow up with them sooner or go to the ED if abdominal pain worsens or she develops any acute signs of bleeding.   With rib  and breast pain, patient advised to follow-up about yearly screenings including low dose lung CT and mammogram. She is actually due for her low dose CT scan next month due to her smoking history which will encompass the area of musculoskeletal pain that she is concerned about. She will call over to get it scheduled. Patient encouraged to continue using icy hot, lidocaine patches and heating pads for management of pain.   The patient was advised to call immediately if she has any concerning symptoms in the interval. The patient voices understanding of current disease status and treatment options and is in agreement with the current care plan. All questions were answered. The patient knows to call the clinic with any problems, questions or concerns. We can certainly see the patient much sooner if necessary  Orders Placed This Encounter  Procedures   CBC with Differential ([place] Only)    Standing Status:   Future    Standing Expiration Date:   12/25/2021   Ferritin    Standing Status:   Future    Standing Expiration Date:   12/25/2021   Iron and TIBC    Standing Status:   Future    Standing Expiration Date:   12/25/2021     The total time spent in the appointment was 20-29 minutes.   Misha Antonini L Arnez Stoneking, PA-C 12/25/20

## 2020-12-25 ENCOUNTER — Telehealth: Payer: Self-pay | Admitting: Gastroenterology

## 2020-12-25 ENCOUNTER — Inpatient Hospital Stay: Payer: Medicare HMO | Attending: Physician Assistant | Admitting: Physician Assistant

## 2020-12-25 ENCOUNTER — Other Ambulatory Visit: Payer: Self-pay | Admitting: Gastroenterology

## 2020-12-25 ENCOUNTER — Other Ambulatory Visit: Payer: Self-pay

## 2020-12-25 ENCOUNTER — Inpatient Hospital Stay: Payer: Medicare HMO

## 2020-12-25 VITALS — BP 121/64 | HR 78 | Temp 97.2°F | Resp 18 | Ht 64.5 in | Wt 109.6 lb

## 2020-12-25 DIAGNOSIS — K7460 Unspecified cirrhosis of liver: Secondary | ICD-10-CM | POA: Insufficient documentation

## 2020-12-25 DIAGNOSIS — K922 Gastrointestinal hemorrhage, unspecified: Secondary | ICD-10-CM | POA: Diagnosis not present

## 2020-12-25 DIAGNOSIS — D500 Iron deficiency anemia secondary to blood loss (chronic): Secondary | ICD-10-CM | POA: Diagnosis not present

## 2020-12-25 DIAGNOSIS — D509 Iron deficiency anemia, unspecified: Secondary | ICD-10-CM

## 2020-12-25 LAB — CBC WITH DIFFERENTIAL (CANCER CENTER ONLY)
Abs Immature Granulocytes: 0.07 10*3/uL (ref 0.00–0.07)
Basophils Absolute: 0 10*3/uL (ref 0.0–0.1)
Basophils Relative: 1 %
Eosinophils Absolute: 0.1 10*3/uL (ref 0.0–0.5)
Eosinophils Relative: 1 %
HCT: 31.8 % — ABNORMAL LOW (ref 36.0–46.0)
Hemoglobin: 9.8 g/dL — ABNORMAL LOW (ref 12.0–15.0)
Immature Granulocytes: 1 %
Lymphocytes Relative: 17 %
Lymphs Abs: 1.1 10*3/uL (ref 0.7–4.0)
MCH: 26.1 pg (ref 26.0–34.0)
MCHC: 30.8 g/dL (ref 30.0–36.0)
MCV: 84.8 fL (ref 80.0–100.0)
Monocytes Absolute: 0.5 10*3/uL (ref 0.1–1.0)
Monocytes Relative: 8 %
Neutro Abs: 4.6 10*3/uL (ref 1.7–7.7)
Neutrophils Relative %: 72 %
Platelet Count: 150 10*3/uL (ref 150–400)
RBC: 3.75 MIL/uL — ABNORMAL LOW (ref 3.87–5.11)
RDW: 24.1 % — ABNORMAL HIGH (ref 11.5–15.5)
WBC Count: 6.3 10*3/uL (ref 4.0–10.5)
nRBC: 0 % (ref 0.0–0.2)

## 2020-12-25 NOTE — Telephone Encounter (Signed)
Inbound call from pt stating that she is out of refills for Spironolactone. Pt stated that she has been out of refills since last week. Please advise. Thank you.

## 2020-12-25 NOTE — Telephone Encounter (Signed)
Refills was sent in today through a electronic refill request we received , we have not received any other request for refills besides the one for today    Called patient to inform refills sent .Marland KitchenMarland KitchenMarland KitchenMarland Kitchen

## 2020-12-26 ENCOUNTER — Telehealth: Payer: Self-pay | Admitting: Physician Assistant

## 2020-12-26 ENCOUNTER — Other Ambulatory Visit: Payer: Self-pay | Admitting: Physician Assistant

## 2020-12-26 DIAGNOSIS — D500 Iron deficiency anemia secondary to blood loss (chronic): Secondary | ICD-10-CM

## 2020-12-26 LAB — FERRITIN: Ferritin: 46 ng/mL (ref 11–307)

## 2020-12-26 LAB — IRON AND TIBC
Iron: 33 ug/dL — ABNORMAL LOW (ref 41–142)
Saturation Ratios: 7 % — ABNORMAL LOW (ref 21–57)
TIBC: 451 ug/dL — ABNORMAL HIGH (ref 236–444)
UIBC: 419 ug/dL — ABNORMAL HIGH (ref 120–384)

## 2020-12-26 NOTE — Telephone Encounter (Signed)
I called the patient to let her know I will arrange for 2 more weekly iron infusions with venofer at [facility]. I reviewed her iron studies with her. We will see her back for labs and follow up in 2 months. She expressed understanding.

## 2020-12-29 ENCOUNTER — Other Ambulatory Visit: Payer: Self-pay | Admitting: *Deleted

## 2020-12-29 DIAGNOSIS — F17210 Nicotine dependence, cigarettes, uncomplicated: Secondary | ICD-10-CM

## 2020-12-29 DIAGNOSIS — Z87891 Personal history of nicotine dependence: Secondary | ICD-10-CM

## 2020-12-31 ENCOUNTER — Other Ambulatory Visit: Payer: Self-pay

## 2020-12-31 ENCOUNTER — Ambulatory Visit
Admission: RE | Admit: 2020-12-31 | Discharge: 2020-12-31 | Disposition: A | Discharge status: Home / Self Care | Payer: Medicare HMO | Source: Ambulatory Visit | Attending: Gastroenterology | Admitting: Gastroenterology

## 2020-12-31 DIAGNOSIS — K7460 Unspecified cirrhosis of liver: Secondary | ICD-10-CM | POA: Diagnosis not present

## 2020-12-31 DIAGNOSIS — K760 Fatty (change of) liver, not elsewhere classified: Secondary | ICD-10-CM

## 2020-12-31 DIAGNOSIS — K8020 Calculus of gallbladder without cholecystitis without obstruction: Secondary | ICD-10-CM | POA: Diagnosis not present

## 2020-12-31 DIAGNOSIS — R7989 Other specified abnormal findings of blood chemistry: Secondary | ICD-10-CM | POA: Diagnosis not present

## 2020-12-31 DIAGNOSIS — Z853 Personal history of malignant neoplasm of breast: Secondary | ICD-10-CM | POA: Diagnosis not present

## 2020-12-31 MED ORDER — GADOBENATE DIMEGLUMINE 529 MG/ML IV SOLN
9.0000 mL | Freq: Once | INTRAVENOUS | Status: AC | PRN
  Administered 2020-12-31: 9 mL via INTRAVENOUS

## 2021-01-01 ENCOUNTER — Inpatient Hospital Stay: Payer: Medicare HMO | Attending: Oncology

## 2021-01-01 ENCOUNTER — Other Ambulatory Visit: Payer: Self-pay | Admitting: Physician Assistant

## 2021-01-01 ENCOUNTER — Encounter: Payer: Self-pay | Admitting: Gastroenterology

## 2021-01-01 VITALS — BP 106/65 | HR 71 | Temp 97.3°F | Resp 18

## 2021-01-01 DIAGNOSIS — D509 Iron deficiency anemia, unspecified: Secondary | ICD-10-CM | POA: Diagnosis not present

## 2021-01-01 DIAGNOSIS — D500 Iron deficiency anemia secondary to blood loss (chronic): Secondary | ICD-10-CM

## 2021-01-01 DIAGNOSIS — K5520 Angiodysplasia of colon without hemorrhage: Secondary | ICD-10-CM

## 2021-01-01 MED ORDER — SODIUM CHLORIDE 0.9 % IV SOLN
Freq: Once | INTRAVENOUS | Status: AC
  Filled 2021-01-01: qty 250

## 2021-01-01 MED ORDER — SODIUM CHLORIDE 0.9 % IV SOLN
300.0000 mg | Freq: Once | INTRAVENOUS | Status: AC
  Administered 2021-01-01: 300 mg via INTRAVENOUS
  Filled 2021-01-01: qty 15

## 2021-01-01 NOTE — Patient Instructions (Signed)
Iron Sucrose Infusion What is this medication? IRON SUCROSE (EYE ern SOO krose) treats low levels of iron (iron deficiency anemia) in people with kidney disease. Iron is a mineral that plays an important role in making red blood cells, which carry oxygen from your lungs to the rest of your body. This medicine may be used for other purposes; ask your health care provider or pharmacist if you have questions. COMMON BRAND NAME(S): Venofer What should I tell my care team before I take this medication? They need to know if you have any of these conditions: Anemia not caused by low iron levels Heart disease High levels of iron in the blood Kidney disease Liver disease An unusual or allergic reaction to iron, other medications, foods, dyes, or preservatives Pregnant or trying to get pregnant Breast-feeding How should I use this medication? This medication is for infusion into a vein. It is given in a hospital or clinic setting. Talk to your care team about the use of this medication in children. While this medication may be prescribed for children as young as 2 years for selected conditions, precautions do apply. Overdosage: If you think you have taken too much of this medicine contact a poison control center or emergency room at once. NOTE: This medicine is only for you. Do not share this medicine with others. What if I miss a dose? It is important not to miss your dose. Call your care team if you are unable to keep an appointment. What may interact with this medication? Do not take this medication with any of the following: Deferoxamine Dimercaprol Other iron products This medication may also interact with the following: Chloramphenicol Deferasirox This list may not describe all possible interactions. Give your health care provider a list of all the medicines, herbs, non-prescription drugs, or dietary supplements you use. Also tell them if you smoke, drink alcohol, or use illegal drugs. Some  items may interact with your medicine. What should I watch for while using this medication? Visit your care team regularly. Tell your care team if your symptoms do not start to get better or if they get worse. You may need blood work done while you are taking this medication. You may need to follow a special diet. Talk to your care team. Foods that contain iron include: whole grains/cereals, dried fruits, beans, or peas, leafy green vegetables, and organ meats (liver, kidney). What side effects may I notice from receiving this medication? Side effects that you should report to your care team as soon as possible: Allergic reactions-skin rash, itching, hives, swelling of the face, lips, tongue, or throat Low blood pressure-dizziness, feeling faint or lightheaded, blurry vision Shortness of breath Side effects that usually do not require medical attention (report to your care team if they continue or are bothersome): Flushing Headache Joint pain Muscle pain Nausea Pain, redness, or irritation at injection site This list may not describe all possible side effects. Call your doctor for medical advice about side effects. You may report side effects to FDA at 1-800-FDA-1088. Where should I keep my medication? This medication is given in a hospital or clinic and will not be stored at home. NOTE: This sheet is a summary. It may not cover all possible information. If you have questions about this medicine, talk to your doctor, pharmacist, or health care provider.  2022 [employer] Standard (2020-06-24 12:52:06)

## 2021-01-01 NOTE — Progress Notes (Signed)
Patient tolerated Venofer infusion well today. Patient refused pre-meds Tylenol & Benadryl. No concerns voiced. Patient discharged. Stable.

## 2021-01-06 ENCOUNTER — Other Ambulatory Visit: Payer: Self-pay | Admitting: Family Medicine

## 2021-01-08 ENCOUNTER — Encounter: Payer: Self-pay | Admitting: Gastroenterology

## 2021-01-08 ENCOUNTER — Ambulatory Visit: Payer: Medicare HMO | Admitting: Gastroenterology

## 2021-01-08 ENCOUNTER — Inpatient Hospital Stay: Payer: Medicare HMO

## 2021-01-08 VITALS — BP 118/73 | HR 73 | Temp 97.9°F | Resp 16

## 2021-01-08 VITALS — BP 112/58 | HR 76 | Ht 64.5 in | Wt 111.2 lb

## 2021-01-08 DIAGNOSIS — D500 Iron deficiency anemia secondary to blood loss (chronic): Secondary | ICD-10-CM

## 2021-01-08 DIAGNOSIS — K7460 Unspecified cirrhosis of liver: Secondary | ICD-10-CM

## 2021-01-08 DIAGNOSIS — K8020 Calculus of gallbladder without cholecystitis without obstruction: Secondary | ICD-10-CM | POA: Diagnosis not present

## 2021-01-08 DIAGNOSIS — D509 Iron deficiency anemia, unspecified: Secondary | ICD-10-CM | POA: Diagnosis not present

## 2021-01-08 DIAGNOSIS — M25552 Pain in left hip: Secondary | ICD-10-CM | POA: Diagnosis not present

## 2021-01-08 DIAGNOSIS — K5520 Angiodysplasia of colon without hemorrhage: Secondary | ICD-10-CM

## 2021-01-08 MED ORDER — SODIUM CHLORIDE 0.9 % IV SOLN
300.0000 mg | Freq: Once | INTRAVENOUS | Status: AC
  Administered 2021-01-08: 300 mg via INTRAVENOUS
  Filled 2021-01-08: qty 300

## 2021-01-08 MED ORDER — URSODIOL 300 MG PO CAPS: 300.0000 mg | ORAL_CAPSULE | Freq: Every day | ORAL | 1 refills | Status: DC

## 2021-01-08 MED ORDER — LIDOCAINE 4 % EX PTCH: MEDICATED_PATCH | CUTANEOUS | 0 refills | Status: DC

## 2021-01-08 MED ORDER — SODIUM CHLORIDE 0.9 % IV SOLN
Freq: Once | INTRAVENOUS | Status: AC
  Filled 2021-01-08: qty 250

## 2021-01-08 NOTE — Patient Instructions (Signed)

## 2021-01-08 NOTE — Progress Notes (Signed)
Christine Barton    [id number]    11-18-1950  Primary Care Physician:Tower, [name], MD  Referring Physician: Tower, [name], MD [place],  [place] [address]   Chief complaint:  LUQ abd pain  HPI:  70 year old very pleasant female with history of breast cancer, type 2 diabetes, COPD, chronic anemia secondary to occult GI blood loss and decompensated cirrhosis   Complains of abdominal pain, more on the left side underneath her breast, patient was pointing to her left rib cage.  She feels bumps.   Denies any nausea, vomiting, melena or bright red blood per rectum.  No other episodes of confusion, lower extremity edema or worsening abdominal distention.   Right upper quadrant abdominal pain resolved after she stopped taking statin.   She is receiving IV iron infusion, hemoglobin on saturation.  She is being managed by hematology   MRI abdomen/liver December 31, 2020 1. Cirrhosis. No liver masses. 2. Trace perihepatic ascites.  Normal size spleen. 3. Cholelithiasis. Chronic mild central intrahepatic and extrahepatic biliary ductal dilatation, unchanged. CBD diameter 9 mm. No evidence of choledocholithiasis. 4. Chronic mild dilation of the dorsal pancreatic duct in the pancreatic head, unchanged, nonspecific. No pancreatic mass.  Abdominal ultrasound April 30, 2020 1. Cirrhotic liver morphology.  No focal liver lesion is identified. 2. Cholelithiasis with a positive sonographic Murphy sign. Findings raise suspicion for acute cholecystitis.   Normal AFP 3.7 in January 2022 MRI liver 01-08-2020: Stable cirrhosis with no evidence of hepatocellular carcinoma. Choledocholithiasis with stable CBD. Increased iron deposition in the setting of IV iron therapy   EGD July 27, 2019 by Dr. Havery Moros: Small esophageal varices and portal hypertensive gastropathy otherwise normal exam   Colonoscopy July 27, 2019: 8 small AVMs scattered in the colon ablated  with APC and 3 small polyps removed, diverticulosis and internal hemorrhoids   EGD February 23, 2019: Small grade 1 esophageal varices, portal hypertensive gastropathy   Colonoscopy November 30, 2016: Diverticulosis, sessile polyps X4 tubular adenomas and internal hemorrhoids.  Recall colonoscopy in 3 years      Outpatient Encounter Medications as of 01/08/2021  Medication Sig   albuterol (VENTOLIN HFA) 108 (90 Base) MCG/ACT inhaler INHALE 2 PUFFS INTO THE LUNGS EVERY 4 HOURS AS NEEDED FOR WHEEZE OR FOR SHORTNESS OF BREATH   budesonide-formoterol (SYMBICORT) 160-4.5 MCG/ACT inhaler TAKE 2 PUFFS BY MOUTH TWICE A DAY   cholecalciferol (VITAMIN D3) 25 MCG (1000 UNIT) tablet Take 1,000 Units by mouth 2 (two) times daily.   clorazepate (TRANXENE) 3.75 MG tablet Take 1 tablet (3.75 mg total) by mouth 2 (two) times daily.   cyclobenzaprine (FLEXERIL) 10 MG tablet TAKE 1/2 TABLET BY MOUTH AT BEDTIME AS NEEDED FOR MUSCLE SPASMS   furosemide (LASIX) 20 MG tablet TAKE 1 TABLET BY MOUTH EVERY DAY   lactulose (CHRONULAC) 10 GM/15ML solution Take 15 mLs (10 g total) by mouth 3 (three) times daily.   metFORMIN (GLUCOPHAGE-XR) 500 MG 24 hr tablet TAKE 1 TABLET BY MOUTH EVERY DAY WITH BREAKFAST   Multiple Vitamin (MULTIVITAMIN) tablet Take 1 tablet by mouth daily.   Multiple Vitamins-Minerals (PRESERVISION AREDS 2) CAPS Take 1 capsule by mouth 2 (two) times daily.   mupirocin ointment (BACTROBAN) 2 % APPLY 1 APPLICATION TOPICALLY 2 (TWO) TIMES DAILY. TO AFFECTED AREAS   nadolol (CORGARD) 20 MG tablet TAKE 1 TABLET BY MOUTH EVERY DAY   nicotine (NICODERM CQ - DOSED IN MG/24 HOURS) 21  mg/24hr patch Place 1 patch (21 mg total) onto the skin daily.   nystatin (MYCOSTATIN) 100000 UNIT/ML suspension TAKE 5 MLS (500,000 UNITS TOTAL) BY MOUTH 3 (THREE) TIMES DAILY. SWISH AND SWALLOW   pantoprazole (PROTONIX) 40 MG tablet TAKE 1 TABLET (40 MG TOTAL) BY MOUTH 2 (TWO) TIMES DAILY BEFORE A MEAL.   spironolactone  (ALDACTONE) 25 MG tablet TAKE 1 TABLET BY MOUTH EVERY DAY   SYSTANE COMPLETE 0.6 % SOLN Apply 1 drop to eye 2 (two) times daily.   temazepam (RESTORIL) 7.5 MG capsule TAKE 1 CAPSULE (7.5 MG TOTAL) BY MOUTH AT BEDTIME AS NEEDED FOR SLEEP.   traMADol (ULTRAM) 50 MG tablet    XIFAXAN 550 MG TABS tablet TAKE 1 TABLET BY MOUTH TWICE A DAY   [DISCONTINUED] Polyethyl Glycol-Propyl Glycol (SYSTANE OP) Place 1 drop into the left eye daily as needed (dryness).   [DISCONTINUED] spironolactone (ALDACTONE) 50 MG tablet TAKE 1 TABLET BY MOUTH TWICE A DAY   No facility-administered encounter medications on file as of 01/08/2021.    Allergies as of 01/08/2021 - Review Complete 01/08/2021  Allergen Reaction Noted   Oxycodone Nausea And Vomiting 11/02/2014   Crestor [rosuvastatin]  11/12/2020   Glipizide Other (See Comments) 09/12/2013    Past Medical History:  Diagnosis Date   Alcohol abuse, unspecified    Breast cancer ([place]) 1998   Right   Cataract    left eye   Cervical spondylosis 2006   MRI   Degeneration of cervical intervertebral disc 2006   MRI   Diabetes mellitus without complication (HCC)    Diverticulosis of colon (without mention of hemorrhage)    Hyperpotassemia    Microscopic hematuria    Mononeuritis of unspecified site    Nonspecific abnormal results of liver function study    Other abnormal glucose    Other and unspecified hyperlipidemia    no per pt   Other chronic nonalcoholic liver disease    Personal history of chemotherapy    Personal history of malignant neoplasm of breast    Personal history of radiation therapy    Pneumothorax, acute    right, spontaneous   Tobacco use disorder    Unspecified vitamin D deficiency     Past Surgical History:  Procedure Laterality Date   BIOPSY  07/27/2019   Procedure: BIOPSY;  Surgeon: Yetta Flock, MD;  Location: [facility];  Service: Gastroenterology;;   BREAST BIOPSY  9/03   Right   BREAST LUMPECTOMY Right 1998    CHEST TUBE INSERTION  11/02/2014   COLONOSCOPY     COLONOSCOPY WITH PROPOFOL N/A 07/27/2019   Procedure: COLONOSCOPY WITH PROPOFOL;  Surgeon: Yetta Flock, MD;  Location: [facility];  Service: Gastroenterology;  Laterality: N/A;   ESOPHAGOGASTRODUODENOSCOPY (EGD) WITH PROPOFOL N/A 10/30/2018   Procedure: ESOPHAGOGASTRODUODENOSCOPY (EGD) WITH PROPOFOL;  Surgeon: Mauri Pole, MD;  Location: [facility];  Service: Endoscopy;  Laterality: N/A;   ESOPHAGOGASTRODUODENOSCOPY (EGD) WITH PROPOFOL N/A 03/12/2019   Procedure: ESOPHAGOGASTRODUODENOSCOPY (EGD) WITH PROPOFOL;  Surgeon: Mauri Pole, MD;  Location: [facility];  Service: Endoscopy;  Laterality: N/A;   ESOPHAGOGASTRODUODENOSCOPY (EGD) WITH PROPOFOL N/A 07/27/2019   Procedure: ESOPHAGOGASTRODUODENOSCOPY (EGD) WITH PROPOFOL;  Surgeon: Yetta Flock, MD;  Location: [facility];  Service: Gastroenterology;  Laterality: N/A;   EYE SURGERY  02/2017   cataract extraction with lens implant-left   HOT HEMOSTASIS N/A 07/27/2019   Procedure: HOT HEMOSTASIS (ARGON PLASMA COAGULATION/BICAP);  Surgeon: Yetta Flock, MD;  Location: [facility];  Service: Gastroenterology;  Laterality: N/A;   MOUTH SURGERY     POLYPECTOMY  07/27/2019   Procedure: POLYPECTOMY;  Surgeon: Yetta Flock, MD;  Location: [facility];  Service: Gastroenterology;;   TUBAL LIGATION      Family History  Problem Relation Age of Onset   Heart failure Father    Heart attack Father    Colon cancer Maternal Uncle    Stroke Mother    Esophageal cancer Neg Hx    Rectal cancer Neg Hx    Stomach cancer Neg Hx    Pancreatic cancer Neg Hx     Social History   Socioeconomic History   Marital status: Single    Spouse name: Not on file   Number of children: 1   Years of education: Not on file   Highest education level: Not on file  Occupational History   Occupation: retired    Fish farm manager: [employer]  Tobacco Use   Smoking  status: Every Day    Packs/day: 1.00    Years: 53.00    Pack years: 53.00    Types: Cigarettes   Smokeless tobacco: Never   Tobacco comments:    Patient is engaged in health coaching for smoking cessation   Vaping Use   Vaping Use: Never used  Substance and Sexual Activity   Alcohol use: Not Currently   Drug use: No   Sexual activity: Not Currently  Other Topics Concern   Not on file  Social History Narrative   Divorced      1 child      Works at [employer] Strain: Low Risk    Difficulty of Paying Living Expenses: Not hard at all  Food Insecurity: No Food Insecurity   Worried About Charity fundraiser in the Last Year: Never true   Arboriculturist in the Last Year: Never true  Transportation Needs: No Transportation Needs   Lack of Transportation (Medical): No   Lack of Transportation (Non-Medical): No  Physical Activity: Inactive   Days of Exercise per Week: 0 days   Minutes of Exercise per Session: 0 min  Stress: No Stress Concern Present   Feeling of Stress : Not at all  Social Connections: Not on file  Intimate Partner Violence: Not At Risk   Fear of Current or Ex-Partner: No   Emotionally Abused: No   Physically Abused: No   Sexually Abused: No      Review of systems: All other review of systems negative except as mentioned in the HPI.   Physical Exam: Vitals:   01/08/21 1048  BP: (!) 112/58  Pulse: 76   Body mass index is 18.8 kg/m. Gen:      No acute distress HEENT:  sclera anicteric Left chest wall tenderness, costochondritis/arthritis Abd:      soft, non-tender; no palpable masses, no distension Ext:    No edema Neuro: alert and oriented x 3 Psych: normal mood and affect  Data Reviewed:  Reviewed labs, radiology imaging, old records and pertinent past GI work up   Assessment and Plan/Recommendations:  70 year old very pleasant female with history of NASH and EtOH  cirrhosis decompensated by ascites and hepatic encephalopathy   Low meld 6 based on prior labs.  Epigastric abdominal pain: Large gallstone, she is not a surgical candidate Will start ursodiol 300 mg daily  Left rib pain/costochondritis arthritis: We will refer to the  [facility] for further evaluation and management of musculoskeletal pain   Iron deficiency anemia: Improved with IV iron infusion Chronic GI blood loss secondary to small bowel AVMs Continue to monitor CBC, iron panel and IV iron infusion as needed.  She is being managed by hematology   Hepatic encephalopathy: Stable Continue Xifaxan and advised patient to take lactulose as needed if she has change in mood or gait.  Also advised her to take lactulose if she has less than 2 or 3 soft bowel movements per day   No evidence of volume overload or ascites on exam, continue low-dose diuretics   HCC screening: No lesions concerning for [facility]   return in 3-4 months or sooner if needed   The patient was provided an opportunity to ask questions and all were answered. The patient agreed with the plan and demonstrated an understanding of the instructions.  Damaris Hippo , MD    CC: Tower, [name], MD

## 2021-01-08 NOTE — Patient Instructions (Signed)
We have referred you to sports medicine they will contact you with that appointment  We have sent Ursodial to your pharmacy  Due to recent changes in healthcare laws, you may see the results of your imaging and laboratory studies on MyChart before your provider has had a chance to review them.  We understand that in some cases there may be results that are confusing or concerning to you. Not all laboratory results come back in the same time frame and the provider may be waiting for multiple results in order to interpret others.  Please give [place] 48 hours in order for your provider to thoroughly review all the results before contacting the office for clarification of your results.    If you are age 2 or older, your body mass index should be between 23-30. Your Body mass index is 18.8 kg/m. If this is out of the aforementioned range listed, please consider follow up with your Primary Care Provider.  If you are age 80 or younger, your body mass index should be between 19-25. Your Body mass index is 18.8 kg/m. If this is out of the aformentioned range listed, please consider follow up with your Primary Care Provider.   __________________________________________________________  The [facility] providers would like to encourage you to use [facility] to communicate with providers for non-urgent requests or questions.  Due to long hold times on the telephone, sending your provider a message by [facility] may be a faster and more efficient way to get a response.  Please allow 48 business hours for a response.  Please remember that this is for non-urgent requests.    I appreciate the  opportunity to care for you  Thank You   Harl Bowie , MD

## 2021-01-09 ENCOUNTER — Telehealth: Payer: Self-pay | Admitting: Gastroenterology

## 2021-01-09 ENCOUNTER — Encounter: Payer: Self-pay | Admitting: Gastroenterology

## 2021-01-09 ENCOUNTER — Telehealth: Payer: Self-pay

## 2021-01-09 NOTE — Telephone Encounter (Signed)
Agree with CT chest.  Thank you

## 2021-01-09 NOTE — Telephone Encounter (Signed)
Tran is going to call the insurance and see what they cover.

## 2021-01-09 NOTE — Telephone Encounter (Signed)
She is having lung cancer screening. CT chest wo contrast.

## 2021-01-09 NOTE — Telephone Encounter (Signed)
[facility] sent [place] a fax saying her ursodiol is not a covered drug. I spoke to the [facility] and told them we will work on this. I have left Christine Barton a message to call her insurance and see what they cover and to let us know.

## 2021-01-12 ENCOUNTER — Ambulatory Visit: Payer: Medicare HMO | Admitting: Family Medicine

## 2021-01-12 NOTE — Telephone Encounter (Signed)
I had Ursodiol approved until 04-11-2022  PA number [id number]  Called pt to inform also notified [facility]

## 2021-01-15 ENCOUNTER — Inpatient Hospital Stay: Payer: Medicare HMO | Attending: Oncology

## 2021-01-15 VITALS — BP 121/55 | HR 76 | Temp 97.9°F | Resp 18

## 2021-01-15 DIAGNOSIS — K5520 Angiodysplasia of colon without hemorrhage: Secondary | ICD-10-CM

## 2021-01-15 DIAGNOSIS — D509 Iron deficiency anemia, unspecified: Secondary | ICD-10-CM | POA: Insufficient documentation

## 2021-01-15 DIAGNOSIS — D500 Iron deficiency anemia secondary to blood loss (chronic): Secondary | ICD-10-CM

## 2021-01-15 MED ORDER — SODIUM CHLORIDE 0.9 % IV SOLN
300.0000 mg | Freq: Once | INTRAVENOUS | Status: AC
  Administered 2021-01-15: 300 mg via INTRAVENOUS
  Filled 2021-01-15: qty 300

## 2021-01-15 MED ORDER — SODIUM CHLORIDE 0.9 % IV SOLN
Freq: Once | INTRAVENOUS | Status: AC
  Filled 2021-01-15: qty 250

## 2021-01-15 NOTE — Progress Notes (Signed)
Patient declines Tylenol and Benadryl for Venofer treatment.

## 2021-01-15 NOTE — Patient Instructions (Signed)

## 2021-01-19 ENCOUNTER — Other Ambulatory Visit: Payer: Self-pay | Admitting: Family Medicine

## 2021-01-26 ENCOUNTER — Other Ambulatory Visit: Payer: Self-pay

## 2021-01-26 ENCOUNTER — Ambulatory Visit
Admission: RE | Admit: 2021-01-26 | Discharge: 2021-01-26 | Disposition: A | Discharge status: Home / Self Care | Payer: Medicare HMO | Source: Ambulatory Visit | Attending: Acute Care | Admitting: Acute Care

## 2021-01-26 DIAGNOSIS — F17210 Nicotine dependence, cigarettes, uncomplicated: Secondary | ICD-10-CM | POA: Diagnosis not present

## 2021-01-26 DIAGNOSIS — Z87891 Personal history of nicotine dependence: Secondary | ICD-10-CM | POA: Insufficient documentation

## 2021-01-27 ENCOUNTER — Telehealth: Payer: Self-pay | Admitting: Gastroenterology

## 2021-01-27 NOTE — Telephone Encounter (Signed)
Inbound call from patient requesting a call back to discuss questions and concerns about last OV with Dr. [name]. Patient would not go into detail. Best contact number [phone]

## 2021-01-28 ENCOUNTER — Telehealth: Payer: Self-pay | Admitting: Acute Care

## 2021-01-28 DIAGNOSIS — R911 Solitary pulmonary nodule: Secondary | ICD-10-CM

## 2021-01-28 NOTE — Progress Notes (Signed)
I have called the patient with the results of her low dose CT. I explained that she has had some growth of a nodule in her right upper lobe since last year. I explained that I have spoken with Dr. Duwayne Heck, and that we will schedule her for a PET scan to take a better look at this nodule. With her history of breast cancer, we want to be very careful with this change that was noted on her scan.  She is in agreement with this plan. I told her she would most likely get a call to get this scan scheduled tomorrow, and that we will determine plan of care based on these results.   She is followed by Dr. Julien Nordmann , and if she needs oncology , she would prefer to be seen by him in [place].   Christine Barton, we need to put her on our tickle list to follow her PET scan results as Christine Barton is ordering PET per Dr. Darnell Level.  Can you please sent the scan results to her PCP and let them know the plan for follow up? Thanks so much

## 2021-01-28 NOTE — Telephone Encounter (Signed)
Agree, pulmonary is following on CT lung results and has a plan. Thanks

## 2021-01-28 NOTE — Telephone Encounter (Signed)
Spoke with the patient. She had questions about the gallstone, the Actigall and the discomfort she feels in her LUQ. She reports her pain is less intense now. She was using heat to her LUQ for comfort when she would have a spell. That does not work as well now and actually makes her more uncomfortable. Describes the pain as "burning." She has not tried any OTC antiacids like TUMS or Gas_x. She wants to try this and see if it changes the pain. She had her "lung scan" and tells me if was not a good report. She is waiting for the provider to tell her what is next.

## 2021-01-28 NOTE — Telephone Encounter (Signed)
I have called the patient with the results of her low dose CT. I explained that she has had some growth of a nodule in her right upper lobe since last year. I explained that I have spoken with Dr. Duwayne Heck, and that we will schedule her for a PET scan to take a better look at this nodule. With her history of breast cancer, we want to be very careful with this change that was noted on her scan.  She is in agreement with this plan. I told her she would most likely get a call to get this scan scheduled tomorrow, and that we will determine plan of care based on these results.   She is followed by Dr. Julien Nordmann , and if she needs oncology , she would prefer to be seen by him in [place].   Langley Gauss, we need to put her on our tickle list to follow her PET scan results as Lesleigh Noe is ordering it per Dr. Darnell Level.  Can you please sent there scan results to her PCP and let them know the plan for follow up?   Thanks so much everyone. Best team ever!!

## 2021-01-28 NOTE — Telephone Encounter (Signed)
Spoke with [facility]- call report on LDCT dated 01/26/21   IMPRESSION: Lung-RADS 4B, suspicious. Additional imaging evaluation or consultation with Pulmonology or Thoracic Surgery recommended.   7.9 mm nodule in the superior segment right lower lobe, progressive, suspicious for small primary bronchogenic neoplasm. Discussion at multidisciplinary tumor board is suggested. Although PET-CT may be beneficial, the nodule is at/just below the size threshold for PET sensitivity.   These results will be called to the ordering clinician or representative by the Radiologist Assistant, and communication documented in the PACS or [employer].   Aortic Atherosclerosis (ICD10-I70.0) and Emphysema (ICD10-J43.9).     Electronically Signed   By: Julian Hy M.D.   On: 01/27/2021 22:58  Forwarding to SG

## 2021-01-29 ENCOUNTER — Telehealth: Payer: Self-pay

## 2021-01-29 NOTE — Telephone Encounter (Signed)
Ct results faxed to PCP with plans for f/u included.

## 2021-01-29 NOTE — Telephone Encounter (Signed)
I have spoke with Christine Barton and her PET scan has been scheduled for 02/11/21 @ 1:30pm

## 2021-01-29 NOTE — Telephone Encounter (Signed)
-----   Message from Magdalen Spatz, NP sent at 01/28/2021 12:48 PM EDT ----- Regarding: Needs PET scan and follow up with Dr. Lucienne Capers, This patient had an abnormal scan. I have discussed it with Dr. Duwayne Heck. Can you place an order for a PET scan and then schedule her for follow up with Dr. Patsey Berthold?  Is there a way you can cc me on the PET order scan so I also get the results? It helps me to follow and document results in the dashboard.  Thanks so much, and call me if you have any questions.   Langley Gauss, I will call the patient with the results. If you can send fax to PCP and keep an eye out for future screening needs is PET is negative. Thanks so much!!

## 2021-01-29 NOTE — Addendum Note (Signed)
Addended by: Claudette Head A on: 01/29/2021 08:56 AM   Modules accepted: Orders

## 2021-01-29 NOTE — Telephone Encounter (Signed)
PET scan has been ordered.

## 2021-01-29 NOTE — Telephone Encounter (Signed)
Appt scheduled for 03/04/2021 at 9:00. Will move up appt if needed after PET.  Patient is aware and voiced her understanding. Nothing further needed.

## 2021-01-29 NOTE — Telephone Encounter (Signed)
PET has been ordered.  Rodena Piety, please let me know once scheduled so I can contact patient for f/u with LG. Thanks

## 2021-01-29 NOTE — Telephone Encounter (Signed)
Will await the PET.  Depending on those results we can move up the appointment if needed.

## 2021-01-29 NOTE — Telephone Encounter (Signed)
First available with Dr. Patsey Berthold is 03/04/2021.   Dr. Patsey Berthold, please advise if patient should be seen sooner. PET is scheduled for 02/11/2021

## 2021-02-04 ENCOUNTER — Telehealth: Payer: Self-pay | Admitting: Family Medicine

## 2021-02-04 NOTE — Telephone Encounter (Signed)
I recommend the bivalent vaccine  Wait until she is feeling better however and keep me posted

## 2021-02-04 NOTE — Telephone Encounter (Signed)
Last a1c was in July  We are good for 6 months so so please plan f/u around 6 months (we should be back at [facility]) Thanks

## 2021-02-04 NOTE — Telephone Encounter (Signed)
Pt called in stating that she received a call saying to make an appt for her A1c.. I tried to make an appt for her but I do not see any orders that are put into the system.

## 2021-02-04 NOTE — Telephone Encounter (Signed)
Patient called and informed about Dr. Alba Cory recommendations. The patient stated that she understood and would wait till she felt a little better but would get the Bivalent. Patient stated her understanding and appreciation for the call.

## 2021-02-04 NOTE — Telephone Encounter (Signed)
See prev note, ??? If pt needs a nurse visit for just an a1c, please advise

## 2021-02-04 NOTE — Telephone Encounter (Signed)
Pt called stating that she scheduled to take her 4th covid test on 02/09/21. Pt is asking which one would Dr Glori Bickers would like for her to take  the Novatec or the Bivalent. Pt also states she had a flu shot on 02/03/21 and is not feeling well. Please advise.

## 2021-02-04 NOTE — Telephone Encounter (Signed)
Patient called and informed that she isn't due to see Dr. Glori Bickers for about 6 months from which is from the last time she had an A1C done which was July. Patient was informed this information and will set up an appointment time when she gets a little closer so she can be seen in [facility]. Patient stated her understanding and appreciation for the call.

## 2021-02-09 ENCOUNTER — Other Ambulatory Visit: Payer: Self-pay | Admitting: Gastroenterology

## 2021-02-10 ENCOUNTER — Telehealth: Payer: Self-pay | Admitting: Gastroenterology

## 2021-02-10 NOTE — Telephone Encounter (Signed)
Inbound call from patient. Calling in with concerns of her reoccurring LUQ pain. States she have been on the ursodiol for over a month and is still feeling the same. States she would like to see Dr. Tamala Julian now if that is still an option.

## 2021-02-10 NOTE — Telephone Encounter (Signed)
What is the plan for the patient?

## 2021-02-11 ENCOUNTER — Other Ambulatory Visit: Payer: Self-pay

## 2021-02-11 ENCOUNTER — Ambulatory Visit
Admission: RE | Admit: 2021-02-11 | Discharge: 2021-02-11 | Disposition: A | Discharge status: Home / Self Care | Payer: Medicare HMO | Source: Ambulatory Visit | Attending: Acute Care | Admitting: Acute Care

## 2021-02-11 DIAGNOSIS — R911 Solitary pulmonary nodule: Secondary | ICD-10-CM | POA: Insufficient documentation

## 2021-02-11 DIAGNOSIS — I2510 Atherosclerotic heart disease of native coronary artery without angina pectoris: Secondary | ICD-10-CM | POA: Insufficient documentation

## 2021-02-11 DIAGNOSIS — C3490 Malignant neoplasm of unspecified part of unspecified bronchus or lung: Secondary | ICD-10-CM | POA: Diagnosis not present

## 2021-02-11 DIAGNOSIS — I700 Atherosclerosis of aorta: Secondary | ICD-10-CM | POA: Diagnosis not present

## 2021-02-11 DIAGNOSIS — Z122 Encounter for screening for malignant neoplasm of respiratory organs: Secondary | ICD-10-CM | POA: Diagnosis not present

## 2021-02-11 DIAGNOSIS — J439 Emphysema, unspecified: Secondary | ICD-10-CM | POA: Insufficient documentation

## 2021-02-11 DIAGNOSIS — K8020 Calculus of gallbladder without cholecystitis without obstruction: Secondary | ICD-10-CM | POA: Insufficient documentation

## 2021-02-11 DIAGNOSIS — K7460 Unspecified cirrhosis of liver: Secondary | ICD-10-CM | POA: Diagnosis not present

## 2021-02-11 LAB — GLUCOSE, CAPILLARY: Glucose-Capillary: 103 mg/dL — ABNORMAL HIGH (ref 70–99)

## 2021-02-11 MED ORDER — FLUDEOXYGLUCOSE F - 18 (FDG) INJECTION
6.2600 | Freq: Once | INTRAVENOUS | Status: AC | PRN
  Administered 2021-02-11: 6.26 via INTRAVENOUS

## 2021-02-11 NOTE — Telephone Encounter (Signed)
Ok to send referral to sports medicine to exclude musculoskeletal pain.  She has gallstones, is not a candidate for cholecystectomy, hence was started on Ursodiol for symptom relief.

## 2021-02-12 NOTE — Telephone Encounter (Addendum)
Discussed with the patient. She states she has continued her supportive measures when she has the pain. She is using heat, Tylenol. Reports she did take Ibuprofen 1 time. Patient wants to pursue the referral to Dr Tamala Julian as previously recommended. She will call for her appointment. Continue her medication. Avoid Ibuprofen and NSAIDs.

## 2021-02-16 ENCOUNTER — Encounter: Payer: Self-pay | Admitting: Pulmonary Disease

## 2021-02-16 ENCOUNTER — Ambulatory Visit: Payer: Medicare HMO | Admitting: Pulmonary Disease

## 2021-02-16 ENCOUNTER — Other Ambulatory Visit: Payer: Self-pay

## 2021-02-16 VITALS — BP 100/60 | HR 77 | Ht 64.5 in | Wt 108.0 lb

## 2021-02-16 DIAGNOSIS — K7030 Alcoholic cirrhosis of liver without ascites: Secondary | ICD-10-CM

## 2021-02-16 DIAGNOSIS — R911 Solitary pulmonary nodule: Secondary | ICD-10-CM | POA: Diagnosis not present

## 2021-02-16 DIAGNOSIS — D500 Iron deficiency anemia secondary to blood loss (chronic): Secondary | ICD-10-CM | POA: Diagnosis not present

## 2021-02-16 DIAGNOSIS — F17210 Nicotine dependence, cigarettes, uncomplicated: Secondary | ICD-10-CM | POA: Diagnosis not present

## 2021-02-16 DIAGNOSIS — J449 Chronic obstructive pulmonary disease, unspecified: Secondary | ICD-10-CM

## 2021-02-16 MED ORDER — BUDESONIDE-FORMOTEROL FUMARATE 160-4.5 MCG/ACT IN AERO: 2.0000 | INHALATION_SPRAY | Freq: Two times a day (BID) | RESPIRATORY_TRACT | 0 refills | Status: DC

## 2021-02-16 NOTE — Progress Notes (Signed)
I, Peterson Lombard, LAT, ATC acting as a scribe for Lynne Leader, MD.  Subjective:    CC: L upper quadrant abdominal pain  HPI: Pt is a 70 y/o female c/o L upper quadrant pain x 2.5 months. Pt locates pain to her L upper quadrant just below her L breast.  She has had some extensive work-up for this by her treatment team somewhat incidentally.  She had a screening CT scan of her chest for lung cancer that was positive for a lung nodule in the right upper chest.  She subsequently had a PET scan that showed increased metabolic activity in the lung nodule concerning for stage I lung cancer and currently is being set up for treatment of this.  Aggravates: any position that is not supine Treatments tried: Tylenol; IBU sporadically; heat  Dx imaging: PET scan- 02/11/21; CT chest/lung- 01/26/21; Liver MRI- 12/31/20; RUQ abdominal US- 04/30/20;             02/04/19 L-spine MRI  12/07/18 L-spine XR  Pertinent review of Systems: No fevers or chills  Relevant historical information: Liver cirrhosis.  Probable lung cancer as above.  Diabetes.  Currently controlled hepatic encephalopathy.   Objective:    Vitals:   02/17/21 1309  BP: (!) 108/52  Pulse: 71  SpO2: 98%   General: Well Developed, well nourished, and in no acute distress.   MSK: Left ribs normal-appearing minimally tender palpation.  Normal trunk motion.  Lab and Radiology Results  X-ray images of the chest wall and T-spine visible on CT scan of the chest obtained January 26, 2021 personally and independently interpreted.  No significant abnormality in this region. Additionally no uptake on the PET scan dated normal uptake on the PET scan dated November 2 in this region.     Impression and Recommendations:    Assessment and Plan: 70 y.o. female with Left chest wall pain.  Pain located below the breast on the anterior left chest wall.  She has had significant imaging of this area and we can be pretty confident that its not a  metastatic spread or some large mass visible on the CT scan.  I think the most likely explanation is probably muscle dysfunction of the chest wall or possibly thoracic radiculopathy at the T6-7 level..  Plan for limited trial of physical therapy.  Limited tramadol for pain control.  Check back in 4 to 6 weeks.  In the interim patient should be contacted by radiation oncology to start potential treatment or at least evaluation for her potential lung cancer.  If she is not contacted in the next few days advised her to contact her pulmonologist or her PCP or me as a backup.  Additionally she has a follow-up appointment scheduled with her hematologist for IV iron on November 16.  I will make sure that Heilingoetter, Cassandra L, PA-C is aware of her PET scan result as well.  PDMP reviewed during this encounter. Orders Placed This Encounter  Procedures   Ambulatory referral to Physical Therapy    Referral Priority:   Routine    Referral Type:   Physical Medicine    Referral Reason:   Specialty Services Required    Requested Specialty:   Physical Therapy    Number of Visits Requested:   1   Meds ordered this encounter  Medications   traMADol (ULTRAM) 50 MG tablet    Sig: Take 1 tablet (50 mg total) by mouth every 8 (eight) hours as needed for severe pain.  Dispense:  15 tablet    Refill:  0    Discussed warning signs or symptoms. Please see discharge instructions. Patient expresses understanding.   The above documentation has been reviewed and is accurate and complete Lynne Leader, M.D.   Total encounter time 45 minutes including face-to-face time with the patient and, reviewing past medical record, and charting on the date of service.

## 2021-02-16 NOTE — Patient Instructions (Signed)
We discussed potential treatments for your lung nodule.  We are doing a referral to Dr. Noreene Filbert for consideration of SBRT (radiation therapy) to the nodule on your right lung.  We are giving you samples of Breztri this will be 2 puffs twice a day make sure you rinse your mouth well after you use it.  This replaces the Symbicort.  You need to use this medication twice a day.  You can still use your emergency inhaler as needed up to 4 times a day.  Hopefully with the [facility] you will not need to use it as much.  Let us know how you do with the Judithann Sauger so we can call it into your pharmacy if it  helps you.   DO NOT USE THE SYMBICORT WHILE YOU TAKE THE BREZTRI.  Judithann Sauger is basically Symbicort with an extra  ingredient.  We will see you in follow-up in 4 to 6 weeks time call sooner should any new difficulties arise.

## 2021-02-16 NOTE — Progress Notes (Signed)
Subjective:    Patient ID: Christine Barton, female    DOB: Dec 11, 1950, 70 y.o.   MRN: [id number] Chief Complaint  Patient presents with   Consult    PET 02/12/2021-- prod cough with yellow sputum, occ wheezing and sob with exertion.      HPI The patient is a 70 year old current smoker (1.5 PPD) who presents for evaluation of an 8 mm right upper lobe nodule found incidentally on low-dose CT chest during lung cancer screening.  The patient presents today with her daughter.  The patient had had this abnormality followed previously imaged in October 2021 at that time being only 3.7 mm.  The nodule is on the same area of prior radiation changes breast cancer noted in the right lung.  PET/CT performed on 2 November shows FDG avidity in this nodule.  There is no evidence of other abnormal uptake in the chest with the exception of the aforementioned area of scarring.  Patient also is oted to have significant emphysema by the CT.Marland Kitchen  The patient has a history of cirrhosis of the liver and chronic issues with anorexia related to this.  She has lost significant amount of weight related to her cirrhosis which is believed to be due to prior alcohol use.  She follows with Dr. Lorna Few for anemia and requires Venofer infusions periodically.  Currently she uses Symbicort and as needed albuterol for issues with COPD.  She admits that she only uses Symbicort maybe once a day and not regularly.  She does get breathless with walking upstairs and on inclines.  She also gets breathless with carrying loads such as groceries.  She does not have any hemoptysis.  She does not endorse any other symptoms.  She is chronically debilitated.  We discussed the alternatives for evaluation/management of this nodule.  The patient was offered robotic bronchoscopy but advised that yield is between 85 to 90% and this does not ensure 100% guarantee that diagnosis will be made.  She is not a candidate for surgical intervention.  Other  possibilities would include SBRT to the area by clinical impression following the PET/CT findings.  The patient and her daughter are more inclined to follow a less invasive means of managing this lung nodule.   Review of Systems A 10 point review of systems was performed and it is as noted above otherwise negative.  Past Medical History:  Diagnosis Date   Alcohol abuse, unspecified    Breast cancer ([place]) 1998   Right   Cataract    left eye   Cervical spondylosis 2006   MRI   Degeneration of cervical intervertebral disc 2006   MRI   Diabetes mellitus without complication ([place])    Diverticulosis of colon (without mention of hemorrhage)    Hyperpotassemia    Microscopic hematuria    Mononeuritis of unspecified site    Nonspecific abnormal results of liver function study    Other abnormal glucose    Other and unspecified hyperlipidemia    no per pt   Other chronic nonalcoholic liver disease    Personal history of chemotherapy    Personal history of malignant neoplasm of breast    Personal history of radiation therapy    Pneumothorax, acute    right, spontaneous   Tobacco use disorder    Unspecified vitamin D deficiency    Past Surgical History:  Procedure Laterality Date   BIOPSY  07/27/2019   Procedure: BIOPSY;  Surgeon: Yetta Flock, MD;  Location: [facility];  Service: Gastroenterology;;   BREAST BIOPSY  9/03   Right   BREAST LUMPECTOMY Right 1998   CHEST TUBE INSERTION  11/02/2014   COLONOSCOPY     COLONOSCOPY WITH PROPOFOL N/A 07/27/2019   Procedure: COLONOSCOPY WITH PROPOFOL;  Surgeon: Yetta Flock, MD;  Location: [facility];  Service: Gastroenterology;  Laterality: N/A;   ESOPHAGOGASTRODUODENOSCOPY (EGD) WITH PROPOFOL N/A 10/30/2018   Procedure: ESOPHAGOGASTRODUODENOSCOPY (EGD) WITH PROPOFOL;  Surgeon: Mauri Pole, MD;  Location: [facility];  Service: Endoscopy;  Laterality: N/A;   ESOPHAGOGASTRODUODENOSCOPY (EGD) WITH PROPOFOL N/A  03/12/2019   Procedure: ESOPHAGOGASTRODUODENOSCOPY (EGD) WITH PROPOFOL;  Surgeon: Mauri Pole, MD;  Location: [facility];  Service: Endoscopy;  Laterality: N/A;   ESOPHAGOGASTRODUODENOSCOPY (EGD) WITH PROPOFOL N/A 07/27/2019   Procedure: ESOPHAGOGASTRODUODENOSCOPY (EGD) WITH PROPOFOL;  Surgeon: Yetta Flock, MD;  Location: [facility];  Service: Gastroenterology;  Laterality: N/A;   EYE SURGERY  02/2017   cataract extraction with lens implant-left   HOT HEMOSTASIS N/A 07/27/2019   Procedure: HOT HEMOSTASIS (ARGON PLASMA COAGULATION/BICAP);  Surgeon: Yetta Flock, MD;  Location: [facility];  Service: Gastroenterology;  Laterality: N/A;   MOUTH SURGERY     POLYPECTOMY  07/27/2019   Procedure: POLYPECTOMY;  Surgeon: Yetta Flock, MD;  Location: [facility];  Service: Gastroenterology;;   TUBAL LIGATION     Family History  Problem Relation Age of Onset   Heart failure Father    Heart attack Father    Colon cancer Maternal Uncle    Stroke Mother    Esophageal cancer Neg Hx    Rectal cancer Neg Hx    Stomach cancer Neg Hx    Pancreatic cancer Neg Hx    Social History   Tobacco Use   Smoking status: Every Day    Packs/day: 1.50    Years: 58.00    Pack years: 87.00    Types: Cigarettes   Smokeless tobacco: Never  Substance Use Topics   Alcohol use: Not Currently   Allergies  Allergen Reactions   Oxycodone Nausea And Vomiting   Crestor [Rosuvastatin]     Abdominal pain     Glipizide Other (See Comments)    Stomach pain   Current Meds  Medication Sig   albuterol (VENTOLIN HFA) 108 (90 Base) MCG/ACT inhaler INHALE 2 PUFFS INTO THE LUNGS EVERY 4 HOURS AS NEEDED FOR WHEEZE OR FOR SHORTNESS OF BREATH   budesonide-formoterol (SYMBICORT) 160-4.5 MCG/ACT inhaler TAKE 2 PUFFS BY MOUTH TWICE A DAY   cholecalciferol (VITAMIN D3) 25 MCG (1000 UNIT) tablet Take 1,000 Units by mouth 2 (two) times daily.   clorazepate (TRANXENE) 3.75 MG tablet Take 1  tablet (3.75 mg total) by mouth 2 (two) times daily.   cyclobenzaprine (FLEXERIL) 10 MG tablet TAKE 1/2 TABLET BY MOUTH AT BEDTIME AS NEEDED FOR MUSCLE SPASMS   furosemide (LASIX) 20 MG tablet TAKE 1 TABLET BY MOUTH EVERY DAY   lactulose (CHRONULAC) 10 GM/15ML solution Take 15 mLs (10 g total) by mouth 3 (three) times daily.   Lidocaine (HM LIDOCAINE PATCH) 4 % PTCH Apply to affected area as needed   metFORMIN (GLUCOPHAGE-XR) 500 MG 24 hr tablet TAKE 1 TABLET BY MOUTH EVERY DAY WITH BREAKFAST   Multiple Vitamin (MULTIVITAMIN) tablet Take 1 tablet by mouth daily.   Multiple Vitamins-Minerals (PRESERVISION AREDS 2) CAPS Take 1 capsule by mouth 2 (two) times daily.   mupirocin ointment (BACTROBAN) 2 % APPLY 1 APPLICATION TOPICALLY 2 (TWO) TIMES DAILY. TO AFFECTED AREAS   nadolol (  CORGARD) 20 MG tablet TAKE 1 TABLET BY MOUTH EVERY DAY   nystatin (MYCOSTATIN) 100000 UNIT/ML suspension TAKE 5 MLS (500,000 UNITS TOTAL) BY MOUTH 3 (THREE) TIMES DAILY. SWISH AND SWALLOW   pantoprazole (PROTONIX) 40 MG tablet TAKE 1 TABLET (40 MG TOTAL) BY MOUTH 2 (TWO) TIMES DAILY BEFORE A MEAL.   spironolactone (ALDACTONE) 25 MG tablet TAKE 1 TABLET BY MOUTH EVERY DAY   SYSTANE COMPLETE 0.6 % SOLN Apply 1 drop to eye 2 (two) times daily.   temazepam (RESTORIL) 7.5 MG capsule TAKE 1 CAPSULE (7.5 MG TOTAL) BY MOUTH AT BEDTIME AS NEEDED FOR SLEEP.   traMADol (ULTRAM) 50 MG tablet    ursodiol (ACTIGALL) 300 MG capsule Take 1 capsule (300 mg total) by mouth daily.   XIFAXAN 550 MG TABS tablet TAKE 1 TABLET BY MOUTH TWICE A DAY   Immunization History  Administered Date(s) Administered   Fluad Quad(high Dose 65+) 12/04/2018, 01/19/2020   Hep A / Hep B 09/05/2018, 10/10/2018, 03/29/2019   Influenza Split 03/10/2011   Influenza Whole 02/11/2000, 02/06/2016   Influenza, High Dose Seasonal PF 02/03/2021   Influenza,inj,Quad PF,6+ Mos 02/16/2017, 04/18/2018   Influenza-Unspecified 02/11/2016   PFIZER(Purple Top)SARS-COV-2  Vaccination 05/21/2019, 06/16/2019, 02/13/2020   Pfizer Covid-19 Vaccine Bivalent Booster 98yr & up 02/10/2021   Pneumococcal Conjugate-13 10/26/2016   Pneumococcal Polysaccharide-23 06/28/2008, 11/07/2017   Td 05/21/2002   Tdap 09/12/2013   Zoster, Live 08/24/2011       Objective:   Physical Exam BP 100/60 (BP Location: Left Arm, Cuff Size: Normal)   Pulse 77   Ht 5' 4.5" (1.638 m)   Wt 108 lb (49 kg)   LMP  (LMP Unknown)   SpO2 95%   BMI 18.25 kg/m  GENERAL: Thin, frail woman, no acute distress, fully ambulatory, no conversational dyspnea. HEAD: Normocephalic, atraumatic.  EYES: Pupils equal, round, reactive to light.  No scleral icterus.  MOUTH: Nose/mouth/throat not examined due to masking requirements for COVID 19. NECK: Supple. No thyromegaly. Trachea midline. No JVD.  No adenopathy. PULMONARY: Good air entry bilaterally.  Coarse breath sounds, diffuse end expiratory wheezes. CARDIOVASCULAR: S1 and S2. Regular rate and rhythm.  ABDOMEN: Scaphoid otherwise benign. MUSCULOSKELETAL: No joint deformity, no clubbing, no edema.  NEUROLOGIC: Grossly nonfocal.  Speech fluent. SKIN: Intact,warm,dry. PSYCH: Mood and behavior normal.  Representative image of PET/CT performed 11 February 2021:     Assessment & Plan:     ICD-10-CM   1. Lung nodule  R91.1 Ambulatory referral to Radiation Oncology    CANCELED: Ambulatory referral to Radiation Oncology   Patient declines robotic bronchoscopy at present Would like evaluation by radiation oncology for SBRT Will make referral    2. COPD [place]  J44.9    Trial of Breztri 2 puffs twice a day We will get PFTs in the future     3. Alcoholic cirrhosis of liver without ascites ([place]  K70.30    Appears compensated at present She is compliant with her medications Has had ascites previously    4. Iron deficiency anemia due to chronic blood loss  D50.0    Follows with Dr. MLorna FewWill inform  Dr. MEarlie Serverof visit    5. Tobacco dependence due to cigarettes  F17.210    Patient counseled regards to discontinuation of smoking     Orders Placed This Encounter  Procedures   Ambulatory referral to Radiation Oncology    Referral Priority:   Routine    Referral Type:  Consultation    Referral Reason:   Specialty Services Required    Requested Specialty:   Radiation Oncology    Number of Visits Requested:   1   Meds ordered this encounter  Medications   budesonide-formoterol (SYMBICORT) 160-4.5 MCG/ACT inhaler    Sig: Inhale 2 puffs into the lungs 2 (two) times daily.    Dispense:  1 each    Refill:  0    Order Specific Question:   Lot Number?    Answer:   [id number]    Order Specific Question:   Expiration Date?    Answer:   09/11/2023    Order Specific Question:   Manufacturer?    Answer:   [employer] [[id number]]    Order Specific Question:   Quantity    Answer:   2   We discussed potential treatments for the patient's lung nodule.  As noted above the patient declined robotic bronchoscopy but will consider SBRT and will be referred to Dr. Noreene Filbert for evaluation.  Clinically she does have significant COPD which appears to be poorly compensated.  We will give her a trial of Breztri 2 puffs twice a day she was shown the proper use of the inhaler and proper use of rinsing after use.  She is to stop using Symbicort.  She was advised she still can use albuterol as rescue up to 4 times a day.  We will see her in follow-up in 4 to 6 weeks time she is to contact us prior to that time should any new difficulties arise.  We will make Dr. Lorna Few aware of the details of this visit.  Renold Don, MD Advanced Bronchoscopy PCCM [facility]    *This note was dictated using voice recognition software/Dragon.  Despite best efforts to proofread, errors can occur which can change the meaning.  Any change was purely unintentional.

## 2021-02-17 ENCOUNTER — Ambulatory Visit (INDEPENDENT_AMBULATORY_CARE_PROVIDER_SITE_OTHER): Payer: Medicare HMO | Admitting: Family Medicine

## 2021-02-17 VITALS — BP 108/52 | HR 71 | Ht 64.5 in | Wt 109.2 lb

## 2021-02-17 DIAGNOSIS — R1012 Left upper quadrant pain: Secondary | ICD-10-CM

## 2021-02-17 DIAGNOSIS — R0789 Other chest pain: Secondary | ICD-10-CM

## 2021-02-17 MED ORDER — TRAMADOL HCL 50 MG PO TABS: 50.0000 mg | ORAL_TABLET | Freq: Three times a day (TID) | ORAL | 0 refills | Status: DC | PRN

## 2021-02-17 NOTE — Patient Instructions (Addendum)
Nice to meet you today.  I've referred you to [facility] Physical Therapy.  Please let us know if you haven't heard from them in one week regarding scheduling.  Follow-up: 6 weeks

## 2021-02-18 ENCOUNTER — Encounter: Payer: Self-pay | Admitting: Pulmonary Disease

## 2021-02-22 NOTE — Progress Notes (Signed)
[facility] OFFICE PROGRESS NOTE  Tower, [name], MD [place] [address]  DIAGNOSIS:  1) Anemia secondary to GI occult blood loss. 2) Presumed stage IA lung cancer (T1a, N0, M0). Patient declined bronchoscopy  PRIOR THERAPY:  Oral iron supplements. Discontinued due to intolerance (severe abdominal pain)  CURRENT THERAPY: 1)  IV iron infusions with venofer PRN. Last infusion on 01/15/21 at [facility]  2) Referral to Dr. Baruch Gouty in radiation oncology for consideration of SBRT.   INTERVAL HISTORY: Christine Barton 70 y.o. female returns to the clinic today for a follow up visit for a a follow up visit. The patient was last seen in the clinic on 12/25/20. From an IDA standpoint, she is doing well.  The patient's iron deficiency anemia is likely secondary to some esophageal varices as well as AVMs. She states that she has been told she bleeds chronically but she has not noticed any visible blood lately. She denies any other abnormal bleeding or bruising to her knowledge including epistaxis, gingival bleeding, hematemesis, hemoptysis, melena, hematochezia, or hematuria. The patient is followed closely by her gastroenterologist. She sees Dr. Remi Haggard. She has been having ongoing LUQ pain which was evaluated by MRI. She has cholelithiasis. Unclear if this is causing her left sided pain but she was deemed to not be a surgical candidate.   She reports an increase in energy since her last iron infusion on 01/15/21 at [facility]. Reports a good appetite and denies weight loss. Denies any fever, sweats or chills. She denies any lightheadedness or dizziness.  At her last appointment, she also was reporting breast pain. She had her routine low dose lung cancer screen on 02/11/21 which incidentally noted what looks like as a stage IA lung cancer. She had a PET scan which showed hypermetabolism in this 8 mm lesion without evidence of nodal metastases or metastatic disease. Her  pulmonologist offered a referral for bronchoscopy but she declined. They have referred her to Dr. Baruch Gouty in 02/26/21.  She quit smoking on 02/21/21. She is using the nicotine patches and gum. She feels like she may be coughing more since her pulmonologist switched one of her inhalers. She notes she feels like her shortness of breath is better since quitting smoking. She has a area of firmness along a superficial blood vessel in her left hand and is wondering what this is.       MEDICAL HISTORY: Past Medical History:  Diagnosis Date   Alcohol abuse, unspecified    Breast cancer ([place]) 1998   Right   Cataract    left eye   Cervical spondylosis 2006   MRI   Degeneration of cervical intervertebral disc 2006   MRI   Diabetes mellitus without complication (HCC)    Diverticulosis of colon (without mention of hemorrhage)    Hyperpotassemia    Microscopic hematuria    Mononeuritis of unspecified site    Nonspecific abnormal results of liver function study    Other abnormal glucose    Other and unspecified hyperlipidemia    no per pt   Other chronic nonalcoholic liver disease    Personal history of chemotherapy    Personal history of malignant neoplasm of breast    Personal history of radiation therapy    Pneumothorax, acute    right, spontaneous   Tobacco use disorder    Unspecified vitamin D deficiency     ALLERGIES:  is allergic to oxycodone, crestor [rosuvastatin], and glipizide.  MEDICATIONS:  Current Outpatient Medications  Medication Sig Dispense Refill   albuterol (VENTOLIN HFA) 108 (90 Base) MCG/ACT inhaler INHALE 2 PUFFS INTO THE LUNGS EVERY 4 HOURS AS NEEDED FOR WHEEZE OR FOR SHORTNESS OF BREATH 18 each 3   budesonide-formoterol (SYMBICORT) 160-4.5 MCG/ACT inhaler TAKE 2 PUFFS BY MOUTH TWICE A DAY 10.2 each 4   budesonide-formoterol (SYMBICORT) 160-4.5 MCG/ACT inhaler Inhale 2 puffs into the lungs 2 (two) times daily. 1 each 0   cholecalciferol (VITAMIN D3) 25 MCG (1000  UNIT) tablet Take 1,000 Units by mouth 2 (two) times daily.     clorazepate (TRANXENE) 3.75 MG tablet Take 1 tablet (3.75 mg total) by mouth 2 (two) times daily. 60 tablet 5   cyclobenzaprine (FLEXERIL) 10 MG tablet TAKE 1/2 TABLET BY MOUTH AT BEDTIME AS NEEDED FOR MUSCLE SPASMS 15 tablet 3   furosemide (LASIX) 20 MG tablet TAKE 1 TABLET BY MOUTH EVERY DAY 90 tablet 1   lactulose (CHRONULAC) 10 GM/15ML solution Take 15 mLs (10 g total) by mouth 3 (three) times daily. 236 mL 11   Lidocaine (HM LIDOCAINE PATCH) 4 % PTCH Apply to affected area as needed 90 patch 0   metFORMIN (GLUCOPHAGE-XR) 500 MG 24 hr tablet TAKE 1 TABLET BY MOUTH EVERY DAY WITH BREAKFAST 90 tablet 1   Multiple Vitamin (MULTIVITAMIN) tablet Take 1 tablet by mouth daily.     Multiple Vitamins-Minerals (PRESERVISION AREDS 2) CAPS Take 1 capsule by mouth 2 (two) times daily.     mupirocin ointment (BACTROBAN) 2 % APPLY 1 APPLICATION TOPICALLY 2 (TWO) TIMES DAILY. TO AFFECTED AREAS 22 g 2   nadolol (CORGARD) 20 MG tablet TAKE 1 TABLET BY MOUTH EVERY DAY 90 tablet 3   nystatin (MYCOSTATIN) 100000 UNIT/ML suspension TAKE 5 MLS (500,000 UNITS TOTAL) BY MOUTH 3 (THREE) TIMES DAILY. SWISH AND SWALLOW 120 mL 0   pantoprazole (PROTONIX) 40 MG tablet TAKE 1 TABLET (40 MG TOTAL) BY MOUTH 2 (TWO) TIMES DAILY BEFORE A MEAL. 180 tablet 1   spironolactone (ALDACTONE) 25 MG tablet TAKE 1 TABLET BY MOUTH EVERY DAY 30 tablet 1   SYSTANE COMPLETE 0.6 % SOLN Apply 1 drop to eye 2 (two) times daily.     temazepam (RESTORIL) 7.5 MG capsule TAKE 1 CAPSULE (7.5 MG TOTAL) BY MOUTH AT BEDTIME AS NEEDED FOR SLEEP. 30 capsule 3   traMADol (ULTRAM) 50 MG tablet Take 1 tablet (50 mg total) by mouth every 8 (eight) hours as needed for severe pain. 15 tablet 0   ursodiol (ACTIGALL) 300 MG capsule Take 1 capsule (300 mg total) by mouth daily. 30 capsule 1   XIFAXAN 550 MG TABS tablet TAKE 1 TABLET BY MOUTH TWICE A DAY 60 tablet 3   No current facility-administered  medications for this visit.    SURGICAL HISTORY:  Past Surgical History:  Procedure Laterality Date   BIOPSY  07/27/2019   Procedure: BIOPSY;  Surgeon: Yetta Flock, MD;  Location: [facility];  Service: Gastroenterology;;   BREAST BIOPSY  9/03   Right   BREAST LUMPECTOMY Right 1998   CHEST TUBE INSERTION  11/02/2014   COLONOSCOPY     COLONOSCOPY WITH PROPOFOL N/A 07/27/2019   Procedure: COLONOSCOPY WITH PROPOFOL;  Surgeon: Yetta Flock, MD;  Location: [facility];  Service: Gastroenterology;  Laterality: N/A;   ESOPHAGOGASTRODUODENOSCOPY (EGD) WITH PROPOFOL N/A 10/30/2018   Procedure: ESOPHAGOGASTRODUODENOSCOPY (EGD) WITH PROPOFOL;  Surgeon: Mauri Pole, MD;  Location: [facility];  Service: Endoscopy;  Laterality: N/A;   ESOPHAGOGASTRODUODENOSCOPY (  EGD) WITH PROPOFOL N/A 03/12/2019   Procedure: ESOPHAGOGASTRODUODENOSCOPY (EGD) WITH PROPOFOL;  Surgeon: Mauri Pole, MD;  Location: [facility];  Service: Endoscopy;  Laterality: N/A;   ESOPHAGOGASTRODUODENOSCOPY (EGD) WITH PROPOFOL N/A 07/27/2019   Procedure: ESOPHAGOGASTRODUODENOSCOPY (EGD) WITH PROPOFOL;  Surgeon: Yetta Flock, MD;  Location: [facility];  Service: Gastroenterology;  Laterality: N/A;   EYE SURGERY  02/2017   cataract extraction with lens implant-left   HOT HEMOSTASIS N/A 07/27/2019   Procedure: HOT HEMOSTASIS (ARGON PLASMA COAGULATION/BICAP);  Surgeon: Yetta Flock, MD;  Location: [facility];  Service: Gastroenterology;  Laterality: N/A;   MOUTH SURGERY     POLYPECTOMY  07/27/2019   Procedure: POLYPECTOMY;  Surgeon: Yetta Flock, MD;  Location: [facility];  Service: Gastroenterology;;   TUBAL LIGATION      REVIEW OF SYSTEMS:   Review of Systems  Constitutional: Negative for appetite change, chills, fatigue, fever and unexpected weight change.  HENT: Negative for mouth sores, nosebleeds, sore throat and trouble swallowing.   Eyes: Negative for eye problems  and icterus.  Respiratory: Positive for cough (worsening) and dyspnea on exertion (improved). Negative for  hemoptysis and wheezing.   Cardiovascular: Negative for chest pain and leg swelling.  Gastrointestinal: Negative for abdominal pain, constipation, diarrhea, nausea and vomiting.  Genitourinary: Negative for bladder incontinence, difficulty urinating, dysuria, frequency and hematuria.   Musculoskeletal: Negative for back pain, gait problem, neck pain and neck stiffness.  Skin: Positive for firmness alone superficial blood vessel in left hand. Negative for itching and rash.  Neurological: Negative for dizziness, extremity weakness, gait problem, headaches, light-headedness and seizures.  Hematological: Negative for adenopathy. Does not bruise/bleed easily.  Psychiatric/Behavioral: Negative for confusion, depression and sleep disturbance. The patient is not nervous/anxious.     PHYSICAL EXAMINATION:  Blood pressure 126/65, pulse 81, temperature 98.2 F (36.8 C), temperature source Tympanic, resp. rate 18, weight 111 lb 1.6 oz (50.4 kg), SpO2 100 %.  ECOG PERFORMANCE STATUS: 1  Physical Exam  Constitutional: Oriented to person, place, and time and thin appearing female. No distress.  HENT:  Head: Normocephalic and atraumatic.  Mouth/Throat: Oropharynx is clear and moist. No oropharyngeal exudate.  Eyes: Conjunctivae are normal. Right eye exhibits no discharge. Left eye exhibits no discharge. No scleral icterus.  Neck: Normal range of motion. Neck supple.  Cardiovascular: Normal rate, regular rhythm, normal heart sounds and intact distal pulses.   Pulmonary/Chest: Effort normal and breath sounds normal. No respiratory distress. No wheezes. No rales.  Abdominal: Soft. Bowel sounds are normal. Exhibits no distension and no mass. There is no tenderness.  Musculoskeletal: Normal range of motion. Exhibits no edema. Small area of Superficial clot/firm ball in superficial blood vessel in left  hand. No swelling, erythema, or tenderness.  Lymphadenopathy:    No cervical adenopathy.  Neurological: Alert and oriented to person, place, and time. Exhibits muscle wasting. Gait normal. Coordination normal.  Skin: Skin is warm and dry. No rash noted. Not diaphoretic. No erythema. No pallor.  Psychiatric: Mood, memory and judgment normal.  Vitals reviewed.  LABORATORY DATA: Lab Results  Component Value Date   WBC 5.7 02/25/2021   HGB 11.1 (L) 02/25/2021   HCT 35.4 (L) 02/25/2021   MCV 91.5 02/25/2021   PLT 145 (L) 02/25/2021      Chemistry      Component Value Date/Time   NA 137 11/07/2020 0814   NA 140 12/11/2019 1617   K 4.2 11/07/2020 0814   CL 100 11/07/2020 0814   CO2  29 11/07/2020 0814   BUN 16 11/07/2020 0814   BUN 35 (H) 12/11/2019 1617   CREATININE 0.82 11/07/2020 0814      Component Value Date/Time   CALCIUM 9.6 11/07/2020 0814   ALKPHOS 77 11/07/2020 0814   AST 17 11/07/2020 0814   ALT 8 11/07/2020 0814   BILITOT 0.6 11/07/2020 0814   BILITOT 0.7 12/11/2019 1617       RADIOGRAPHIC STUDIES:  NM PET Image Initial (PI) Skull Base To Thigh (F-18 FDG)  Result Date: 02/12/2021 CLINICAL DATA:  Initial treatment strategy for lung cancer screening CT demonstrating a superior segment right lower lobe pulmonary nodule. EXAM: NUCLEAR MEDICINE PET SKULL BASE TO THIGH TECHNIQUE: 6.3 mCi F-18 FDG was injected intravenously. Full-ring PET imaging was performed from the skull base to thigh after the radiotracer. CT data was obtained and used for attenuation correction and anatomic localization. Fasting blood glucose: 103 mg/dl COMPARISON:  Lung cancer screening CT 01/26/2021. 01/01/2021 abdominal MRI FINDINGS: Mediastinal blood pool activity: SUV max 1.9 Liver activity: SUV max NA NECK: No areas of abnormal hypermetabolism. Incidental CT findings: Bilateral carotid atherosclerosis. No cervical adenopathy. CHEST: No thoracic nodal hypermetabolism. The superior segment right  lower lobe pulmonary nodule is hypermetabolic for size. Example 8 mm and a S.U.V. max of 2.3 on 82/3. Right axillary node dissection. Incidental CT findings: Deferred to recent diagnostic CT. Right apical and less so anterior upper lobe presumably radiation induced fibrosis. Centrilobular emphysema. Aortic and coronary artery calcification. ABDOMEN/PELVIS: No abdominopelvic parenchymal or nodal hypermetabolism. Incidental CT findings: Cirrhosis. Dependent gallstones. Normal adrenal glands. Abdominal aortic atherosclerosis. Extensive colonic diverticulosis. Pelvic floor laxity. SKELETON: No abnormal marrow activity. Incidental CT findings: none IMPRESSION: 1. The 8 mm superior segment right lower lobe pulmonary nodule is hypermetabolic for size, most consistent with primary bronchogenic carcinoma. No evidence of thoracic nodal or hypermetabolic extrathoracic metastatic disease. Presuming non-small-cell histology, T1aN0M0 or stage IA. 2. Incidental findings, including: Aortic atherosclerosis (ICD10-I70.0), coronary artery atherosclerosis and emphysema (ICD10-J43.9). Cholelithiasis. Cirrhosis. Electronically Signed   By: Abigail Miyamoto M.D.   On: 02/12/2021 11:55   CT CHEST LUNG CA SCREEN LOW DOSE W/O CM  Result Date: 01/27/2021 CLINICAL DATA:  33-year-old female current smoker, with 55 pack-year history of smoking, for follow-up lung cancer screening EXAM: CT CHEST WITHOUT CONTRAST LOW-DOSE FOR LUNG CANCER SCREENING TECHNIQUE: Multidetector CT imaging of the chest was performed following the standard protocol without IV contrast. COMPARISON:  Low-dose lung cancer screening CT chest dated 01/24/2020 FINDINGS: Cardiovascular: The heart is normal in size. No pericardial effusion. No evidence of thoracic aortic aneurysm. Atherosclerotic calcifications of the arch. Coronary atherosclerosis of the LAD and right coronary artery. Mediastinum/Nodes: No suspicious mediastinal lymphadenopathy. Visualized thyroid is notable for  a 13 mm left thyroid nodule. Not clinically significant; no follow-up imaging recommended (ref: J Am Coll Radiol. 2015 Feb;12(2): 143-50). Lungs/Pleura: Suspected radiation changes in the anterior right lung apex (series 3/image 44), partially calcified, unchanged. Additional radiation changes in the anterior right upper lobe. Mild centrilobular emphysematous changes, upper lung predominant. No focal consolidation. 7.9 mm nodule in the superior segment right lower lobe (image 112), previously 3.7 mm, suspicious. Additional scattered bilateral pulmonary nodules measuring up to 4.2 mm. No pleural effusion or pneumothorax. Upper Abdomen: Visualized upper abdomen is notable for mild layering gallstones (series 2/image 87) and vascular calcifications. Musculoskeletal: Status post right mastectomy with right axillary lymph node dissection. Mild degenerative changes of the upper lumbar spine. IMPRESSION: Lung-RADS 4B, suspicious. Additional imaging evaluation or consultation  with Pulmonology or Thoracic Surgery recommended. 7.9 mm nodule in the superior segment right lower lobe, progressive, suspicious for small primary bronchogenic neoplasm. Discussion at multidisciplinary tumor board is suggested. Although PET-CT may be beneficial, the nodule is at/just below the size threshold for PET sensitivity. These results will be called to the ordering clinician or representative by the Radiologist Assistant, and communication documented in the PACS or [employer]. Aortic Atherosclerosis (ICD10-I70.0) and Emphysema (ICD10-J43.9). Electronically Signed   By: Julian Hy M.D.   On: 01/27/2021 22:58     ASSESSMENT/PLAN:  This is a very pleasant 70 year old Caucasian female with iron deficiency anemia likely secondary to occult GI blood loss. She also was found to have a presumed stage IA (T2a, N0, M0) in November 2022.   She declined bronchscopy and biopsy. She is scheduled to see Dr. Baruch Gouty in radiation oncology  on 02/26/21 for consideration of SBRT. I let the patient know that if they do not have SBRT there, that she can come to [facility] for SBRT.   The patient was seen with Dr. Julien Nordmann. Regarding the iron deficeincy anemia. The patient had a repeat CBC, iron studies, and ferritin performed today.  The patient's CBC demonstrated a Hbg of 11.1 compared to 9.8 2 months ago. Her MCV is 91.5 compared to 84.8. The patient's iron studies still are low. Her ferritin is improved to 55.  It would be reasonable to administer 2 more iron infusions at [facility] with venofer 300 mg weekly x2.    Since she is unable to tolerate oral iron supplements, patient was encouraged to eat an iron-rich diet.    We will see her back for a follow up visit in 3 months for evaluation and repeat CBC, iron, and ferritin.   Discussed no need/indication for chemotherapy with stage IA lung cancer.  She will continue to follow with Dr. Remi Haggard from GI regarding her abdominal pain.   The patient was advised to call immediately if she has any concerning symptoms in the interval. The patient voices understanding of current disease status and treatment options and is in agreement with the current care plan. All questions were answered. The patient knows to call the clinic with any problems, questions or concerns. We can certainly see the patient much sooner if necessary     Orders Placed This Encounter  Procedures   CBC with Differential ([facility] Only)    Standing Status:   Future    Standing Expiration Date:   02/25/2022   Ferritin    Standing Status:   Future    Standing Expiration Date:   02/25/2022   Iron and TIBC    Standing Status:   Future    Standing Expiration Date:   02/25/2022      Tzvi Economou L Zurich Carreno, PA-C 02/25/21  ADDENDUM: Hematology/Oncology Attending: I had a face-to-face encounter with the patient today.  I reviewed her records, lab, scan and recommended her care plan.  This is a very pleasant 70  years old white female who is seeing [place] for iron deficiency anemia secondary to occult GI blood loss the patient is status post iron infusion with Venofer on as-needed basis and she has significant improvement in her hemoglobin and hematocrit after the iron infusion. She incidentally was found ton CT screening of the chest to have a nodule in the superior segment of the right lower lobe suspicious for small primary bronchogenic carcinoma.  The patient had a PET scan on February 11, 2021 and that showed the  0.8 cm superior segment right lower lobe pulmonary nodule is hypermetabolic for size and consistent with primary bronchogenic carcinoma.  She declined bronchoscopy by Dr. Patsey Berthold. The patient was referred to Dr. Donella Stade at [facility] center for consideration of SBRT to this nodule. I recommended for the patient to proceed with her treatment as planned. Regarding the iron deficiency anemia, she is doing much better today with hemoglobin up to 11.1 and hematocrit 35.4%.  Her iron study showed persistent mild iron deficiency with normal ferritin level.  We will arrange for the patient to receive 2 more iron infusion with Venofer at [facility]. The patient will come back for follow-up visit in 3 months for evaluation and repeat CBC, iron study and ferritin. She was advised to call immediately if she has any other concerning symptoms in the interval. The total time spent in the appointment was 30 minutes. Disclaimer: This note was dictated with voice recognition software. Similar sounding words can inadvertently be transcribed and may be missed upon review. Eilleen Kempf, MD 02/25/21

## 2021-02-25 ENCOUNTER — Other Ambulatory Visit: Payer: Self-pay

## 2021-02-25 ENCOUNTER — Inpatient Hospital Stay: Payer: Medicare HMO | Attending: Physician Assistant

## 2021-02-25 ENCOUNTER — Inpatient Hospital Stay: Payer: Medicare HMO | Admitting: Physician Assistant

## 2021-02-25 VITALS — BP 126/65 | HR 81 | Temp 98.2°F | Resp 18 | Wt 111.1 lb

## 2021-02-25 DIAGNOSIS — Z853 Personal history of malignant neoplasm of breast: Secondary | ICD-10-CM | POA: Diagnosis not present

## 2021-02-25 DIAGNOSIS — K5520 Angiodysplasia of colon without hemorrhage: Secondary | ICD-10-CM

## 2021-02-25 DIAGNOSIS — D509 Iron deficiency anemia, unspecified: Secondary | ICD-10-CM

## 2021-02-25 DIAGNOSIS — Z87891 Personal history of nicotine dependence: Secondary | ICD-10-CM | POA: Diagnosis not present

## 2021-02-25 DIAGNOSIS — D500 Iron deficiency anemia secondary to blood loss (chronic): Secondary | ICD-10-CM | POA: Insufficient documentation

## 2021-02-25 DIAGNOSIS — I8501 Esophageal varices with bleeding: Secondary | ICD-10-CM | POA: Insufficient documentation

## 2021-02-25 DIAGNOSIS — Z9221 Personal history of antineoplastic chemotherapy: Secondary | ICD-10-CM | POA: Insufficient documentation

## 2021-02-25 DIAGNOSIS — Z79899 Other long term (current) drug therapy: Secondary | ICD-10-CM | POA: Diagnosis not present

## 2021-02-25 DIAGNOSIS — Q2739 Arteriovenous malformation, other site: Secondary | ICD-10-CM | POA: Insufficient documentation

## 2021-02-25 DIAGNOSIS — Z923 Personal history of irradiation: Secondary | ICD-10-CM | POA: Insufficient documentation

## 2021-02-25 DIAGNOSIS — C3490 Malignant neoplasm of unspecified part of unspecified bronchus or lung: Secondary | ICD-10-CM | POA: Insufficient documentation

## 2021-02-25 DIAGNOSIS — Z7984 Long term (current) use of oral hypoglycemic drugs: Secondary | ICD-10-CM | POA: Insufficient documentation

## 2021-02-25 DIAGNOSIS — E1136 Type 2 diabetes mellitus with diabetic cataract: Secondary | ICD-10-CM | POA: Insufficient documentation

## 2021-02-25 DIAGNOSIS — F1010 Alcohol abuse, uncomplicated: Secondary | ICD-10-CM | POA: Diagnosis not present

## 2021-02-25 LAB — CBC WITH DIFFERENTIAL (CANCER CENTER ONLY)
Abs Immature Granulocytes: 0.08 10*3/uL — ABNORMAL HIGH (ref 0.00–0.07)
Basophils Absolute: 0 10*3/uL (ref 0.0–0.1)
Basophils Relative: 0 %
Eosinophils Absolute: 0.1 10*3/uL (ref 0.0–0.5)
Eosinophils Relative: 1 %
HCT: 35.4 % — ABNORMAL LOW (ref 36.0–46.0)
Hemoglobin: 11.1 g/dL — ABNORMAL LOW (ref 12.0–15.0)
Immature Granulocytes: 1 %
Lymphocytes Relative: 18 %
Lymphs Abs: 1 10*3/uL (ref 0.7–4.0)
MCH: 28.7 pg (ref 26.0–34.0)
MCHC: 31.4 g/dL (ref 30.0–36.0)
MCV: 91.5 fL (ref 80.0–100.0)
Monocytes Absolute: 0.4 10*3/uL (ref 0.1–1.0)
Monocytes Relative: 7 %
Neutro Abs: 4.1 10*3/uL (ref 1.7–7.7)
Neutrophils Relative %: 73 %
Platelet Count: 145 10*3/uL — ABNORMAL LOW (ref 150–400)
RBC: 3.87 MIL/uL (ref 3.87–5.11)
RDW: 18.7 % — ABNORMAL HIGH (ref 11.5–15.5)
WBC Count: 5.7 10*3/uL (ref 4.0–10.5)
nRBC: 0 % (ref 0.0–0.2)

## 2021-02-25 LAB — IRON AND TIBC
Iron: 35 ug/dL — ABNORMAL LOW (ref 41–142)
Saturation Ratios: 8 % — ABNORMAL LOW (ref 21–57)
TIBC: 453 ug/dL — ABNORMAL HIGH (ref 236–444)
UIBC: 418 ug/dL — ABNORMAL HIGH (ref 120–384)

## 2021-02-25 LAB — FERRITIN: Ferritin: 55 ng/mL (ref 11–307)

## 2021-02-26 ENCOUNTER — Encounter: Payer: Self-pay | Admitting: Radiation Oncology

## 2021-02-26 ENCOUNTER — Telehealth: Payer: Self-pay | Admitting: Physician Assistant

## 2021-02-26 ENCOUNTER — Ambulatory Visit
Admission: RE | Admit: 2021-02-26 | Discharge: 2021-02-26 | Disposition: A | Discharge status: Home / Self Care | Payer: Medicare HMO | Source: Ambulatory Visit | Attending: Radiation Oncology | Admitting: Radiation Oncology

## 2021-02-26 VITALS — BP 125/73 | HR 99 | Temp 97.6°F | Wt 110.0 lb

## 2021-02-26 DIAGNOSIS — D649 Anemia, unspecified: Secondary | ICD-10-CM | POA: Diagnosis not present

## 2021-02-26 DIAGNOSIS — Z9221 Personal history of antineoplastic chemotherapy: Secondary | ICD-10-CM | POA: Insufficient documentation

## 2021-02-26 DIAGNOSIS — Z7984 Long term (current) use of oral hypoglycemic drugs: Secondary | ICD-10-CM | POA: Insufficient documentation

## 2021-02-26 DIAGNOSIS — R918 Other nonspecific abnormal finding of lung field: Secondary | ICD-10-CM | POA: Insufficient documentation

## 2021-02-26 DIAGNOSIS — E119 Type 2 diabetes mellitus without complications: Secondary | ICD-10-CM | POA: Insufficient documentation

## 2021-02-26 DIAGNOSIS — Z923 Personal history of irradiation: Secondary | ICD-10-CM | POA: Diagnosis not present

## 2021-02-26 DIAGNOSIS — E559 Vitamin D deficiency, unspecified: Secondary | ICD-10-CM | POA: Diagnosis not present

## 2021-02-26 DIAGNOSIS — C3490 Malignant neoplasm of unspecified part of unspecified bronchus or lung: Secondary | ICD-10-CM

## 2021-02-26 DIAGNOSIS — F17210 Nicotine dependence, cigarettes, uncomplicated: Secondary | ICD-10-CM | POA: Insufficient documentation

## 2021-02-26 DIAGNOSIS — F1010 Alcohol abuse, uncomplicated: Secondary | ICD-10-CM | POA: Diagnosis not present

## 2021-02-26 NOTE — Consult Note (Signed)
NEW PATIENT EVALUATION  Name: Christine Barton  MRN: [id number]  Date:   02/26/2021     DOB: 04/22/1950   This 70 y.o. female patient presents to the clinic for initial evaluation of probable non-small cell lung cancer stage one of the superior segment of the right lower lobe.  REFERRING PHYSICIAN: Tower, [name], MD  CHIEF COMPLAINT:  Chief Complaint  Patient presents with   Lung Cancer    DIAGNOSIS: The encounter diagnosis was Malignant neoplasm of lung, unspecified laterality, unspecified part of lung ([place]).   PREVIOUS INVESTIGATIONS:  PET/CT and CT scans reviewed Clinical notes reviewed Labs reviewed Case presented at our tumor conference  HPI: Patient is a 70 year old female followed by medical oncology in [facility] for anemia secondary to occult blood loss from the GI tract.  She had a screening CT scan of the chest showing 8 mm nodule in the superior segment of the right lower lobe suspicious for malignancy.  PET CT scan confirmed hypermetabolic activity the 8 mm lesion most consistent with primary bronchogenic carcinoma stage T1 a N0 lesion.  Patient has been seen by pulmonology although has declined navigational bronchoscopy.  Based on her tumor board recommendations she is now referred to radiation oncology for consideration of SBRT.  She does have a chronic cough recently has quit smoking.  Specifically denies hemoptysis.  Does have some dyspnea on exertion. PLANNED TREATMENT REGIMEN: SBRT  PAST MEDICAL HISTORY:  has a past medical history of Alcohol abuse, unspecified, Breast cancer ([place]) (1998), Cataract, Cervical spondylosis (2006), Degeneration of cervical intervertebral disc (2006), Diabetes mellitus without complication ([place]), Diverticulosis of colon (without mention of hemorrhage), Hyperpotassemia, Microscopic hematuria, Mononeuritis of unspecified site, Nonspecific abnormal results of liver function study, Other abnormal glucose, Other and unspecified hyperlipidemia,  Other chronic nonalcoholic liver disease, Personal history of chemotherapy, Personal history of malignant neoplasm of breast, Personal history of radiation therapy, Pneumothorax, acute, Tobacco use disorder, and Unspecified vitamin D deficiency.    PAST SURGICAL HISTORY:  Past Surgical History:  Procedure Laterality Date   BIOPSY  07/27/2019   Procedure: BIOPSY;  Surgeon: Yetta Flock, MD;  [facility];  Service: Gastroenterology;;   BREAST BIOPSY  9/03   Right   BREAST LUMPECTOMY Right 1998   CHEST TUBE INSERTION  11/02/2014   COLONOSCOPY     COLONOSCOPY WITH PROPOFOL N/A 07/27/2019   Procedure: COLONOSCOPY WITH PROPOFOL;  Surgeon: Yetta Flock, MD;  [facility];  Service: Gastroenterology;  Laterality: N/A;   ESOPHAGOGASTRODUODENOSCOPY (EGD) WITH PROPOFOL N/A 10/30/2018   Procedure: ESOPHAGOGASTRODUODENOSCOPY (EGD) WITH PROPOFOL;  Surgeon: Mauri Pole, MD;  [facility];  Service: Endoscopy;  Laterality: N/A;   ESOPHAGOGASTRODUODENOSCOPY (EGD) WITH PROPOFOL N/A 03/12/2019   Procedure: ESOPHAGOGASTRODUODENOSCOPY (EGD) WITH PROPOFOL;  Surgeon: Mauri Pole, MD;  [facility];  Service: Endoscopy;  Laterality: N/A;   ESOPHAGOGASTRODUODENOSCOPY (EGD) WITH PROPOFOL N/A 07/27/2019   Procedure: ESOPHAGOGASTRODUODENOSCOPY (EGD) WITH PROPOFOL;  Surgeon: Yetta Flock, MD;  [facility];  Service: Gastroenterology;  Laterality: N/A;   EYE SURGERY  02/2017   cataract extraction with lens implant-left   HOT HEMOSTASIS N/A 07/27/2019   Procedure: HOT HEMOSTASIS (ARGON PLASMA COAGULATION/BICAP);  Surgeon: Yetta Flock, MD;  Location[facility];  Service: Gastroenterology;  Laterality: N/A;   MOUTH SURGERY     POLYPECTOMY  07/27/2019   Procedure: POLYPECTOMY;  Surgeon: Yetta Flock, MD;  [facility];  Service: Gastroenterology;;   TUBAL LIGATION  FAMILY HISTORY: family history  includes Colon cancer in her maternal uncle; Heart attack in her father; Heart failure in her father; Stroke in her mother.  SOCIAL HISTORY:  reports that she has been smoking cigarettes. She has a 87.00 pack-year smoking history. She has never used smokeless tobacco. She reports that she does not currently use alcohol. She reports that she does not use drugs.  ALLERGIES: Oxycodone, Crestor [rosuvastatin], and Glipizide  MEDICATIONS:  Current Outpatient Medications  Medication Sig Dispense Refill   albuterol (VENTOLIN HFA) 108 (90 Base) MCG/ACT inhaler INHALE 2 PUFFS INTO THE LUNGS EVERY 4 HOURS AS NEEDED FOR WHEEZE OR FOR SHORTNESS OF BREATH 18 each 3   budesonide-formoterol (SYMBICORT) 160-4.5 MCG/ACT inhaler TAKE 2 PUFFS BY MOUTH TWICE A DAY 10.2 each 4   budesonide-formoterol (SYMBICORT) 160-4.5 MCG/ACT inhaler Inhale 2 puffs into the lungs 2 (two) times daily. 1 each 0   cholecalciferol (VITAMIN D3) 25 MCG (1000 UNIT) tablet Take 1,000 Units by mouth 2 (two) times daily.     clorazepate (TRANXENE) 3.75 MG tablet Take 1 tablet (3.75 mg total) by mouth 2 (two) times daily. 60 tablet 5   cyclobenzaprine (FLEXERIL) 10 MG tablet TAKE 1/2 TABLET BY MOUTH AT BEDTIME AS NEEDED FOR MUSCLE SPASMS 15 tablet 3   furosemide (LASIX) 20 MG tablet TAKE 1 TABLET BY MOUTH EVERY DAY 90 tablet 1   lactulose (CHRONULAC) 10 GM/15ML solution Take 15 mLs (10 g total) by mouth 3 (three) times daily. 236 mL 11   Lidocaine (HM LIDOCAINE PATCH) 4 % PTCH Apply to affected area as needed 90 patch 0   metFORMIN (GLUCOPHAGE-XR) 500 MG 24 hr tablet TAKE 1 TABLET BY MOUTH EVERY DAY WITH BREAKFAST 90 tablet 1   Multiple Vitamin (MULTIVITAMIN) tablet Take 1 tablet by mouth daily.     Multiple Vitamins-Minerals (PRESERVISION AREDS 2) CAPS Take 1 capsule by mouth 2 (two) times daily.     mupirocin ointment (BACTROBAN) 2 % APPLY 1 APPLICATION TOPICALLY 2 (TWO) TIMES DAILY. TO AFFECTED AREAS 22 g 2   nadolol (CORGARD) 20 MG  tablet TAKE 1 TABLET BY MOUTH EVERY DAY 90 tablet 3   nystatin (MYCOSTATIN) 100000 UNIT/ML suspension TAKE 5 MLS (500,000 UNITS TOTAL) BY MOUTH 3 (THREE) TIMES DAILY. SWISH AND SWALLOW 120 mL 0   pantoprazole (PROTONIX) 40 MG tablet TAKE 1 TABLET (40 MG TOTAL) BY MOUTH 2 (TWO) TIMES DAILY BEFORE A MEAL. 180 tablet 1   spironolactone (ALDACTONE) 25 MG tablet TAKE 1 TABLET BY MOUTH EVERY DAY 30 tablet 1   SYSTANE COMPLETE 0.6 % SOLN Apply 1 drop to eye 2 (two) times daily.     temazepam (RESTORIL) 7.5 MG capsule TAKE 1 CAPSULE (7.5 MG TOTAL) BY MOUTH AT BEDTIME AS NEEDED FOR SLEEP. 30 capsule 3   traMADol (ULTRAM) 50 MG tablet Take 1 tablet (50 mg total) by mouth every 8 (eight) hours as needed for severe pain. 15 tablet 0   ursodiol (ACTIGALL) 300 MG capsule Take 1 capsule (300 mg total) by mouth daily. 30 capsule 1   XIFAXAN 550 MG TABS tablet TAKE 1 TABLET BY MOUTH TWICE A DAY 60 tablet 3   No current facility-administered medications for this encounter.    ECOG PERFORMANCE STATUS:  0 - Asymptomatic  REVIEW OF SYSTEMS: Patient denies any weight loss, fatigue, weakness, fever, chills or night sweats. Patient denies any loss of vision, blurred vision. Patient denies any ringing  of the ears or hearing loss. No irregular heartbeat. Patient  denies heart murmur or history of fainting. Patient denies any chest pain or pain radiating to her upper extremities. Patient denies any shortness of breath, difficulty breathing at night, cough or hemoptysis. Patient denies any swelling in the lower legs. Patient denies any nausea vomiting, vomiting of blood, or coffee ground material in the vomitus. Patient denies any stomach pain. Patient states has had normal bowel movements no significant constipation or diarrhea. Patient denies any dysuria, hematuria or significant nocturia. Patient denies any problems walking, swelling in the joints or loss of balance. Patient denies any skin changes, loss of hair or loss of  weight. Patient denies any excessive worrying or anxiety or significant depression. Patient denies any problems with insomnia. Patient denies excessive thirst, polyuria, polydipsia. Patient denies any swollen glands, patient denies easy bruising or easy bleeding. Patient denies any recent infections, allergies or URI. Patient "s visual fields have not changed significantly in recent time.   PHYSICAL EXAM: BP 125/73   Pulse 99   Temp 97.6 F (36.4 C) (Tympanic)   Wt 110 lb (49.9 kg)   LMP  (LMP Unknown)   BMI 18.59 kg/m  Well-developed well-nourished patient in NAD. HEENT reveals PERLA, EOMI, discs not visualized.  Oral cavity is clear. No oral mucosal lesions are identified. Neck is clear without evidence of cervical or supraclavicular adenopathy. Lungs are clear to A&P. Cardiac examination is essentially unremarkable with regular rate and rhythm without murmur rub or thrill. Abdomen is benign with no organomegaly or masses noted. Motor sensory and DTR levels are equal and symmetric in the upper and lower extremities. Cranial nerves II through XII are grossly intact. Proprioception is intact. No peripheral adenopathy or edema is identified. No motor or sensory levels are noted. Crude visual fields are within normal range.  LABORATORY DATA: Labs reviewed    RADIOLOGY RESULTS: CT scans and PET CT scans reviewed compatible with above-stated findings   IMPRESSION: Probable stage Ia non-small cell lung cancer of the superior segment the right lower lobe in 70 year old female  PLAN: At this time I have recommended SBRT empirically.  We will plan on delivering 60 Gray in 5 fractions.  Would use motion restriction and 4-dimensional treatment planning and simulation.  Risks and benefits of minor side effect profile were reviewed with the patient.  She may develop a cough about a month out and have some slight fatigue during treatments.  Patient was adamant that her medical oncologist Dr. Earlie Server in  [place] told her she should only have 1 fraction of radiation and has declined treatment.  I am alerting him to this fact.  Would be happy to reevaluate her and schedule her for simulation.  I would like to take this opportunity to thank you for allowing me to participate in the care of your patient.Noreene Filbert, MD

## 2021-02-26 NOTE — Telephone Encounter (Signed)
I received a message that the patient refused SBRT at her consultation today. I called the patient to clarify. There seemed to be a misunderstanding and the patient was apologetic. I encouraged her to get SBRT as planned. She was agreeable and I will reach back out to Dr. Collier Bullock office to have them get her rescheduled.

## 2021-03-03 DIAGNOSIS — R1012 Left upper quadrant pain: Secondary | ICD-10-CM | POA: Diagnosis not present

## 2021-03-04 ENCOUNTER — Institutional Professional Consult (permissible substitution): Payer: Medicare HMO | Admitting: Pulmonary Disease

## 2021-03-07 ENCOUNTER — Other Ambulatory Visit: Payer: Self-pay | Admitting: Gastroenterology

## 2021-03-09 ENCOUNTER — Telehealth: Payer: Self-pay | Admitting: Pulmonary Disease

## 2021-03-09 ENCOUNTER — Ambulatory Visit
Admission: RE | Admit: 2021-03-09 | Discharge: 2021-03-09 | Disposition: A | Discharge status: Home / Self Care | Payer: Medicare HMO | Source: Ambulatory Visit | Attending: Radiation Oncology | Admitting: Radiation Oncology

## 2021-03-09 ENCOUNTER — Other Ambulatory Visit: Payer: Self-pay | Admitting: Gastroenterology

## 2021-03-09 DIAGNOSIS — Z510 Encounter for antineoplastic radiation therapy: Secondary | ICD-10-CM | POA: Diagnosis not present

## 2021-03-09 DIAGNOSIS — C3490 Malignant neoplasm of unspecified part of unspecified bronchus or lung: Secondary | ICD-10-CM | POA: Diagnosis not present

## 2021-03-09 DIAGNOSIS — R918 Other nonspecific abnormal finding of lung field: Secondary | ICD-10-CM | POA: Diagnosis not present

## 2021-03-09 NOTE — Telephone Encounter (Signed)
Per Dr. Nestor Ramp to move OV up.   Appt scheduled 04/14/2020 at 3:00.  Patient is aware and voiced her understanding.  Nothing further needed at this time.

## 2021-03-11 ENCOUNTER — Inpatient Hospital Stay: Payer: Medicare HMO | Attending: Oncology

## 2021-03-11 ENCOUNTER — Other Ambulatory Visit: Payer: Self-pay

## 2021-03-11 VITALS — BP 106/63 | HR 76 | Temp 96.9°F | Resp 16

## 2021-03-11 DIAGNOSIS — D500 Iron deficiency anemia secondary to blood loss (chronic): Secondary | ICD-10-CM

## 2021-03-11 DIAGNOSIS — K5520 Angiodysplasia of colon without hemorrhage: Secondary | ICD-10-CM

## 2021-03-11 DIAGNOSIS — D509 Iron deficiency anemia, unspecified: Secondary | ICD-10-CM | POA: Insufficient documentation

## 2021-03-11 MED ORDER — SODIUM CHLORIDE 0.9 % IV SOLN
300.0000 mg | Freq: Once | INTRAVENOUS | Status: AC
  Administered 2021-03-11: 300 mg via INTRAVENOUS
  Filled 2021-03-11: qty 15

## 2021-03-11 MED ORDER — SODIUM CHLORIDE 0.9 % IV SOLN
Freq: Once | INTRAVENOUS | Status: AC
Start: 2021-03-11 — End: 2021-03-11
  Filled 2021-03-11: qty 250

## 2021-03-12 ENCOUNTER — Other Ambulatory Visit: Payer: Self-pay | Admitting: Gastroenterology

## 2021-03-12 ENCOUNTER — Ambulatory Visit
Admission: RE | Admit: 2021-03-12 | Discharge: 2021-03-12 | Disposition: A | Discharge status: Home / Self Care | Payer: Medicare HMO | Source: Ambulatory Visit | Attending: Radiation Oncology | Admitting: Radiation Oncology

## 2021-03-12 ENCOUNTER — Other Ambulatory Visit: Payer: Self-pay | Admitting: Family Medicine

## 2021-03-12 DIAGNOSIS — Z510 Encounter for antineoplastic radiation therapy: Secondary | ICD-10-CM | POA: Insufficient documentation

## 2021-03-12 DIAGNOSIS — C3431 Malignant neoplasm of lower lobe, right bronchus or lung: Secondary | ICD-10-CM | POA: Diagnosis not present

## 2021-03-12 DIAGNOSIS — C3490 Malignant neoplasm of unspecified part of unspecified bronchus or lung: Secondary | ICD-10-CM

## 2021-03-12 DIAGNOSIS — R918 Other nonspecific abnormal finding of lung field: Secondary | ICD-10-CM | POA: Diagnosis not present

## 2021-03-12 HISTORY — DX: Malignant neoplasm of unspecified part of unspecified bronchus or lung: C34.90

## 2021-03-12 NOTE — Telephone Encounter (Signed)
6 month f/u scheduled on 05/15/20, last filled on 09/16/20 #15 tabs with 3 refills

## 2021-03-13 ENCOUNTER — Encounter: Payer: Self-pay | Admitting: Gastroenterology

## 2021-03-13 ENCOUNTER — Ambulatory Visit: Payer: Medicare HMO | Admitting: Gastroenterology

## 2021-03-13 VITALS — BP 100/50 | HR 80 | Ht 64.25 in | Wt 110.2 lb

## 2021-03-13 DIAGNOSIS — K7030 Alcoholic cirrhosis of liver without ascites: Secondary | ICD-10-CM | POA: Diagnosis not present

## 2021-03-13 DIAGNOSIS — R109 Unspecified abdominal pain: Secondary | ICD-10-CM | POA: Diagnosis not present

## 2021-03-13 DIAGNOSIS — D500 Iron deficiency anemia secondary to blood loss (chronic): Secondary | ICD-10-CM

## 2021-03-13 DIAGNOSIS — K7682 Hepatic encephalopathy: Secondary | ICD-10-CM

## 2021-03-13 DIAGNOSIS — R0781 Pleurodynia: Secondary | ICD-10-CM | POA: Diagnosis not present

## 2021-03-13 DIAGNOSIS — R1012 Left upper quadrant pain: Secondary | ICD-10-CM | POA: Diagnosis not present

## 2021-03-13 DIAGNOSIS — K8020 Calculus of gallbladder without cholecystitis without obstruction: Secondary | ICD-10-CM

## 2021-03-13 DIAGNOSIS — K7460 Unspecified cirrhosis of liver: Secondary | ICD-10-CM

## 2021-03-13 NOTE — Progress Notes (Signed)
Christine Barton    [id number]    [birth year]  Primary Care Physician:Tower, [name], MD  Referring Physician: Tower, [name], MD [place],  [place] [id number]   Chief complaint:  cirrhosis  HPI:  70 year old very pleasant female with history of breast cancer, type 2 diabetes, COPD, chronic anemia secondary to occult GI blood loss and decompensated cirrhosis   Left side abdominal pain is improving since she is doing physical therapy.  Recently diagnosed with ligament lung nodule, undergoing radiation therapy   Denies any nausea, vomiting, melena or bright red blood per rectum.  No other episodes of confusion, lower extremity edema or worsening abdominal distention.     She is receiving IV iron infusion, hemoglobin on saturation.  She is being managed by hematology   MRI abdomen/liver December 31, 2020 1. Cirrhosis. No liver masses. 2. Trace perihepatic ascites.  Normal size spleen. 3. Cholelithiasis. Chronic mild central intrahepatic and extrahepatic biliary ductal dilatation, unchanged. CBD diameter 9 mm. No evidence of choledocholithiasis. 4. Chronic mild dilation of the dorsal pancreatic duct in the pancreatic head, unchanged, nonspecific. No pancreatic mass.   Abdominal ultrasound April 30, 2020 1. Cirrhotic liver morphology.  No focal liver lesion is identified. 2. Cholelithiasis with a positive sonographic Murphy sign. Findings raise suspicion for acute cholecystitis.   Normal AFP 3.7 in January 2022 MRI liver 01-08-2020: Stable cirrhosis with no evidence of hepatocellular carcinoma. Choledocholithiasis with stable CBD. Increased iron deposition in the setting of IV iron therapy   EGD July 27, 2019 by Dr. Havery Moros: Small esophageal varices and portal hypertensive gastropathy otherwise normal exam   Colonoscopy July 27, 2019: 8 small AVMs scattered in the colon ablated with APC and 3 small polyps removed, diverticulosis and internal  hemorrhoids   EGD February 23, 2019: Small grade 1 esophageal varices, portal hypertensive gastropathy   Colonoscopy November 30, 2016: Diverticulosis, sessile polyps X4 tubular adenomas and internal hemorrhoids.  Recall colonoscopy in 3 years      Outpatient Encounter Medications as of 03/13/2021  Medication Sig   albuterol (VENTOLIN HFA) 108 (90 Base) MCG/ACT inhaler INHALE 2 PUFFS INTO THE LUNGS EVERY 4 HOURS AS NEEDED FOR WHEEZE OR FOR SHORTNESS OF BREATH   budesonide-formoterol (SYMBICORT) 160-4.5 MCG/ACT inhaler TAKE 2 PUFFS BY MOUTH TWICE A DAY   budesonide-formoterol (SYMBICORT) 160-4.5 MCG/ACT inhaler Inhale 2 puffs into the lungs 2 (two) times daily.   cholecalciferol (VITAMIN D3) 25 MCG (1000 UNIT) tablet Take 1,000 Units by mouth 2 (two) times daily.   clorazepate (TRANXENE) 3.75 MG tablet Take 1 tablet (3.75 mg total) by mouth 2 (two) times daily.   cyclobenzaprine (FLEXERIL) 10 MG tablet TAKE 1/2 TABLET BY MOUTH AT BEDTIME AS NEEDED FOR MUSCLE SPASMS   furosemide (LASIX) 20 MG tablet TAKE 1 TABLET BY MOUTH EVERY DAY   lactulose (CHRONULAC) 10 GM/15ML solution Take 15 mLs (10 g total) by mouth 3 (three) times daily.   Lidocaine (HM LIDOCAINE PATCH) 4 % PTCH Apply to affected area as needed   metFORMIN (GLUCOPHAGE-XR) 500 MG 24 hr tablet TAKE 1 TABLET BY MOUTH EVERY DAY WITH BREAKFAST   Multiple Vitamin (MULTIVITAMIN) tablet Take 1 tablet by mouth daily.   Multiple Vitamins-Minerals (PRESERVISION AREDS 2) CAPS Take 1 capsule by mouth 2 (two) times daily.   mupirocin ointment (BACTROBAN) 2 % APPLY 1 APPLICATION TOPICALLY 2 (TWO) TIMES DAILY. TO AFFECTED AREAS   nadolol (CORGARD) 20 MG tablet  TAKE 1 TABLET BY MOUTH EVERY DAY   nystatin (MYCOSTATIN) 100000 UNIT/ML suspension TAKE 5 MLS (500,000 UNITS TOTAL) BY MOUTH 3 (THREE) TIMES DAILY. SWISH AND SWALLOW   pantoprazole (PROTONIX) 40 MG tablet TAKE 1 TABLET (40 MG TOTAL) BY MOUTH 2 (TWO) TIMES DAILY BEFORE A MEAL.   spironolactone  (ALDACTONE) 25 MG tablet TAKE 1 TABLET BY MOUTH EVERY DAY   SYSTANE COMPLETE 0.6 % SOLN Apply 1 drop to eye 2 (two) times daily.   temazepam (RESTORIL) 7.5 MG capsule TAKE 1 CAPSULE (7.5 MG TOTAL) BY MOUTH AT BEDTIME AS NEEDED FOR SLEEP.   traMADol (ULTRAM) 50 MG tablet Take 1 tablet (50 mg total) by mouth every 8 (eight) hours as needed for severe pain.   ursodiol (ACTIGALL) 300 MG capsule TAKE 1 CAPSULE BY MOUTH EVERY DAY   XIFAXAN 550 MG TABS tablet TAKE 1 TABLET BY MOUTH TWICE A DAY   No facility-administered encounter medications on file as of 03/13/2021.    Allergies as of 03/13/2021 - Review Complete 03/13/2021  Allergen Reaction Noted   Oxycodone Nausea And Vomiting 11/02/2014   Crestor [rosuvastatin]  11/12/2020   Glipizide Other (See Comments) 09/12/2013    Past Medical History:  Diagnosis Date   Alcohol abuse, unspecified    Breast cancer ([place]) 1998   Right   Cataract    left eye   Cervical spondylosis 2006   MRI   Degeneration of cervical intervertebral disc 2006   MRI   Diabetes mellitus without complication ([place])    Diverticulosis of colon (without mention of hemorrhage)    Hyperpotassemia    Lung cancer ([facility])    Microscopic hematuria    Mononeuritis of unspecified site    Nonspecific abnormal results of liver function study    Other abnormal glucose    Other and unspecified hyperlipidemia    no per pt   Other chronic nonalcoholic liver disease    Personal history of chemotherapy    Personal history of malignant neoplasm of breast    Personal history of radiation therapy    Pneumothorax, acute    right, spontaneous   Tobacco use disorder    Unspecified vitamin D deficiency     Past Surgical History:  Procedure Laterality Date   BIOPSY  07/27/2019   Procedure: BIOPSY;  Surgeon: Yetta Flock, MD;  [facility];  Service: Gastroenterology;;   BREAST BIOPSY  9/03   Right   BREAST LUMPECTOMY Right 1998   CHEST TUBE INSERTION  11/02/2014    COLONOSCOPY     COLONOSCOPY WITH PROPOFOL N/A 07/27/2019   Procedure: COLONOSCOPY WITH PROPOFOL;  Surgeon: Yetta Flock, MD;  [facility];  Service: Gastroenterology;  Laterality: N/A;   ESOPHAGOGASTRODUODENOSCOPY (EGD) WITH PROPOFOL N/A 10/30/2018   Procedure: ESOPHAGOGASTRODUODENOSCOPY (EGD) WITH PROPOFOL;  Surgeon: Mauri Pole, MD;  [facility];  Service: Endoscopy;  Laterality: N/A;   ESOPHAGOGASTRODUODENOSCOPY (EGD) WITH PROPOFOL N/A 03/12/2019   Procedure: ESOPHAGOGASTRODUODENOSCOPY (EGD) WITH PROPOFOL;  Surgeon: Mauri Pole, MD;  [facility];  Service: Endoscopy;  Laterality: N/A;   ESOPHAGOGASTRODUODENOSCOPY (EGD) WITH PROPOFOL N/A 07/27/2019   Procedure: ESOPHAGOGASTRODUODENOSCOPY (EGD) WITH PROPOFOL;  Surgeon: Yetta Flock, MD;  [facility];  Service: Gastroenterology;  Laterality: N/A;   EYE SURGERY  02/2017   cataract extraction with lens implant-left   HOT HEMOSTASIS N/A 07/27/2019   Procedure: HOT HEMOSTASIS (ARGON PLASMA COAGULATION/BICAP);  Surgeon: Yetta Flock, MD;  [facility];  Service: Gastroenterology;  Laterality:  N/A;   MOUTH SURGERY     POLYPECTOMY  07/27/2019   Procedure: POLYPECTOMY;  Surgeon: Yetta Flock, MD;  [facility];  Service: Gastroenterology;;   TUBAL LIGATION      Family History  Problem Relation Age of Onset   Heart failure Father    Heart attack Father    Colon cancer Maternal Uncle    Stroke Mother    Esophageal cancer Neg Hx    Rectal cancer Neg Hx    Stomach cancer Neg Hx    Pancreatic cancer Neg Hx     Social History   Socioeconomic History   Marital status: Single    Spouse name: Not on file   Number of children: 1   Years of education: Not on file   Highest education level: Not on file  Occupational History   Occupation: retired    Fish farm manager: [employer]  Tobacco Use   Smoking status: Every Day    Packs/day: 1.50     Years: 58.00    Pack years: 87.00    Types: Cigarettes   Smokeless tobacco: Never  Vaping Use   Vaping Use: Never used  Substance and Sexual Activity   Alcohol use: Not Currently   Drug use: No   Sexual activity: Not Currently  Other Topics Concern   Not on file  Social History Narrative   Divorced      1 child      Works at [employer] Strain: Low Risk    Difficulty of Paying Living Expenses: Not hard at all  Food Insecurity: No Food Insecurity   Worried About Charity fundraiser in the Last Year: Never true   Arboriculturist in the Last Year: Never true  Transportation Needs: No Transportation Needs   Lack of Transportation (Medical): No   Lack of Transportation (Non-Medical): No  Physical Activity: Inactive   Days of Exercise per Week: 0 days   Minutes of Exercise per Session: 0 min  Stress: No Stress Concern Present   Feeling of Stress : Not at all  Social Connections: Not on file  Intimate Partner Violence: Not At Risk   Fear of Current or Ex-Partner: No   Emotionally Abused: No   Physically Abused: No   Sexually Abused: No      Review of systems: All other review of systems negative except as mentioned in the HPI.   Physical Exam: Vitals:   03/13/21 1113  BP: (!) 100/50  Pulse: 80   Body mass index is 18.78 kg/m. Gen:      No acute distress HEENT:  sclera anicteric Abd:      soft, non-tender; no palpable masses, no distension.  No clinically significant ascites Ext:    No edema Neuro: alert and oriented x 3.  No asterixis Psych: normal mood and affect  Data Reviewed:  Reviewed labs, radiology imaging, old records and pertinent past GI work up   Assessment and Plan/Recommendations:    70 year old very pleasant female with history of NASH and EtOH cirrhosis    Low meld 6  No clinical evidence of hepatic decompensation Overall stable from cirrhosis standpoint    Epigastric abdominal pain: Secondary to large symptomatic gallstone, she is not a surgical candidate Continue ursodiol 300 mg daily monitor symptoms   Left rib pain/costochondritis arthritis: Continue physical therapy Ligament pulmonary nodule, undergoing radiation therapy  Iron deficiency  anemia: Improved with IV iron infusion Chronic GI blood loss secondary to small bowel AVMs Continue to monitor CBC, iron panel and IV iron infusion as needed.  She is being managed by hematology   Hepatic encephalopathy: Stable Continue Xifaxan and advised patient to take lactulose as needed if she has change in mood or gait.  Also advised her to take lactulose if she has less than 2 or 3 soft bowel movements per day   No evidence of volume overload or ascites on exam, continue low-dose diuretics   HCC screening: No lesions concerning for [facility]    Return in 3 months or sooner if needed  The patient was provided an opportunity to ask questions and all were answered. The patient agreed with the plan and demonstrated an understanding of the instructions.  Damaris Hippo , MD    CC: Tower, [name], MD

## 2021-03-13 NOTE — Patient Instructions (Addendum)
Follow up 3 months.   Office will  contact you at a later time for an office appointment.   If you are age 70 or older, your body mass index should be between 23-30. Your Body mass index is 18.78 kg/m. If this is out of the aforementioned range listed, please consider follow up with your Primary Care Provider.  If you are age 72 or younger, your body mass index should be between 19-25. Your Body mass index is 18.78 kg/m. If this is out of the aformentioned range listed, please consider follow up with your Primary Care Provider.   ________________________________________________________  The [place] [facility] providers would like to encourage you to use [facility] to communicate with providers for non-urgent requests or questions.  Due to long hold times on the telephone, sending your provider a message by [facility] may be a faster and more efficient way to get a response.  Please allow 48 business hours for a response.  Please remember that this is for non-urgent requests.  _______________________________________________________  Thank you for choosing me and [facility].  Dr. [name]

## 2021-03-17 DIAGNOSIS — Z510 Encounter for antineoplastic radiation therapy: Secondary | ICD-10-CM | POA: Diagnosis not present

## 2021-03-17 DIAGNOSIS — C3431 Malignant neoplasm of lower lobe, right bronchus or lung: Secondary | ICD-10-CM | POA: Diagnosis not present

## 2021-03-18 ENCOUNTER — Inpatient Hospital Stay: Payer: Medicare HMO | Attending: Oncology

## 2021-03-18 ENCOUNTER — Other Ambulatory Visit: Payer: Self-pay

## 2021-03-18 VITALS — BP 103/64 | HR 61 | Temp 97.3°F

## 2021-03-18 DIAGNOSIS — D509 Iron deficiency anemia, unspecified: Secondary | ICD-10-CM | POA: Diagnosis not present

## 2021-03-18 DIAGNOSIS — K5520 Angiodysplasia of colon without hemorrhage: Secondary | ICD-10-CM

## 2021-03-18 DIAGNOSIS — D500 Iron deficiency anemia secondary to blood loss (chronic): Secondary | ICD-10-CM

## 2021-03-18 MED ORDER — SODIUM CHLORIDE 0.9 % IV SOLN
Freq: Once | INTRAVENOUS | Status: AC
  Filled 2021-03-18: qty 250

## 2021-03-18 MED ORDER — SODIUM CHLORIDE 0.9 % IV SOLN
300.0000 mg | Freq: Once | INTRAVENOUS | Status: AC
  Administered 2021-03-18: 300 mg via INTRAVENOUS
  Filled 2021-03-18: qty 15

## 2021-03-18 NOTE — Patient Instructions (Signed)

## 2021-03-19 ENCOUNTER — Ambulatory Visit
Admission: RE | Admit: 2021-03-19 | Discharge: 2021-03-19 | Disposition: A | Discharge status: Home / Self Care | Payer: Medicare HMO | Source: Ambulatory Visit | Attending: Radiation Oncology | Admitting: Radiation Oncology

## 2021-03-19 ENCOUNTER — Telehealth: Payer: Self-pay | Admitting: Adult Health

## 2021-03-19 DIAGNOSIS — Z510 Encounter for antineoplastic radiation therapy: Secondary | ICD-10-CM | POA: Diagnosis not present

## 2021-03-19 DIAGNOSIS — C3431 Malignant neoplasm of lower lobe, right bronchus or lung: Secondary | ICD-10-CM | POA: Diagnosis not present

## 2021-03-20 NOTE — Telephone Encounter (Signed)
Last filled 11/1 appt due this month

## 2021-03-20 NOTE — Telephone Encounter (Signed)
Has apt 12/12

## 2021-03-20 NOTE — Telephone Encounter (Signed)
Please schedule appt

## 2021-03-23 ENCOUNTER — Ambulatory Visit (INDEPENDENT_AMBULATORY_CARE_PROVIDER_SITE_OTHER): Payer: Medicare HMO | Admitting: Adult Health

## 2021-03-23 ENCOUNTER — Encounter: Payer: Self-pay | Admitting: Adult Health

## 2021-03-23 DIAGNOSIS — F411 Generalized anxiety disorder: Secondary | ICD-10-CM | POA: Diagnosis not present

## 2021-03-23 DIAGNOSIS — G4700 Insomnia, unspecified: Secondary | ICD-10-CM

## 2021-03-23 MED ORDER — CLORAZEPATE DIPOTASSIUM 3.75 MG PO TABS: 3.7500 mg | ORAL_TABLET | Freq: Two times a day (BID) | ORAL | 2 refills | Status: DC

## 2021-03-23 NOTE — Addendum Note (Signed)
Addended by: Aloha Gell on: 03/23/2021 05:02 PM   Modules accepted: Level of Service

## 2021-03-23 NOTE — Progress Notes (Signed)
Christine Barton [id number] 01-11-1951 70 y.o.  Virtual Visit via Telephone Note  I connected with pt on 03/23/21 at  4:20 PM EST by telephone and verified that I am speaking with the correct person using two identifiers.   I discussed the limitations, risks, security and privacy concerns of performing an evaluation and management service by telephone and the availability of in person appointments. I also discussed with the patient that there may be a patient responsible charge related to this service. The patient expressed understanding and agreed to proceed.   I discussed the assessment and treatment plan with the patient. The patient was provided an opportunity to ask questions and all were answered. The patient agreed with the plan and demonstrated an understanding of the instructions.   The patient was advised to call back or seek an in-person evaluation if the symptoms worsen or if the condition fails to improve as anticipated.  I provided 10 minutes of non-face-to-face time during this encounter.  The patient was located at home.  The provider was located at [place].   Aloha Gell, NP   Subjective:   Patient ID:  Christine Barton is a 70 y.o. (DOB January 06, 1951) female.  Chief Complaint: No chief complaint on file.   HPI Christine Barton presents for follow-up of GAD and insomnia.   Describes mood today as "ok". Pleasant. Mood symptoms - denies depression, anxiety, and irritability at times.  Denies panic attacks. Stating "I'm doing alright". Feels like Tranxene continues to work well for her. Works in Scientist, research (physical sciences). Diagnosed with lung cancer. Stable interest and motivation. Taking medications as prescribed.  Energy levels stable. Active, does not have a regular exercise routine.  Enjoys some usual interests and activities. Lives alone with cats. Spending time with family.  Appetite adequate. Weight stable. Sleeps well for the most part. Averages 7 to 8 hours. Focus  and concentration pretty good. Completing tasks. Managing aspects of household. Retired from [employer]. Denies SI or HI.  Denies AH or VH.   Review of Systems:  Review of Systems  Musculoskeletal:  Negative for gait problem.  Neurological:  Negative for tremors.  Psychiatric/Behavioral:         Please refer to HPI   Medications: I have reviewed the patient's current medications.  Current Outpatient Medications  Medication Sig Dispense Refill   albuterol (VENTOLIN HFA) 108 (90 Base) MCG/ACT inhaler INHALE 2 PUFFS INTO THE LUNGS EVERY 4 HOURS AS NEEDED FOR WHEEZE OR FOR SHORTNESS OF BREATH 18 each 3   budesonide-formoterol (SYMBICORT) 160-4.5 MCG/ACT inhaler TAKE 2 PUFFS BY MOUTH TWICE A DAY 10.2 each 4   budesonide-formoterol (SYMBICORT) 160-4.5 MCG/ACT inhaler Inhale 2 puffs into the lungs 2 (two) times daily. 1 each 0   cholecalciferol (VITAMIN D3) 25 MCG (1000 UNIT) tablet Take 1,000 Units by mouth 2 (two) times daily.     clorazepate (TRANXENE) 3.75 MG tablet Take 1 tablet (3.75 mg total) by mouth 2 (two) times daily. 60 tablet 2   cyclobenzaprine (FLEXERIL) 10 MG tablet TAKE 1/2 TABLET BY MOUTH AT BEDTIME AS NEEDED FOR MUSCLE SPASMS 15 tablet 3   furosemide (LASIX) 20 MG tablet TAKE 1 TABLET BY MOUTH EVERY DAY 90 tablet 1   lactulose (CHRONULAC) 10 GM/15ML solution Take 15 mLs (10 g total) by mouth 3 (three) times daily. 236 mL 11   Lidocaine (HM LIDOCAINE PATCH) 4 % PTCH Apply to affected area as needed 90 patch 0   metFORMIN (GLUCOPHAGE-XR) 500 MG  24 hr tablet TAKE 1 TABLET BY MOUTH EVERY DAY WITH BREAKFAST 90 tablet 1   Multiple Vitamin (MULTIVITAMIN) tablet Take 1 tablet by mouth daily.     Multiple Vitamins-Minerals (PRESERVISION AREDS 2) CAPS Take 1 capsule by mouth 2 (two) times daily.     mupirocin ointment (BACTROBAN) 2 % APPLY 1 APPLICATION TOPICALLY 2 (TWO) TIMES DAILY. TO AFFECTED AREAS 22 g 2   nadolol (CORGARD) 20 MG tablet TAKE 1 TABLET BY MOUTH EVERY DAY 90  tablet 3   nystatin (MYCOSTATIN) 100000 UNIT/ML suspension TAKE 5 MLS (500,000 UNITS TOTAL) BY MOUTH 3 (THREE) TIMES DAILY. SWISH AND SWALLOW 120 mL 0   pantoprazole (PROTONIX) 40 MG tablet TAKE 1 TABLET (40 MG TOTAL) BY MOUTH 2 (TWO) TIMES DAILY BEFORE A MEAL. 180 tablet 1   spironolactone (ALDACTONE) 25 MG tablet TAKE 1 TABLET BY MOUTH EVERY DAY 30 tablet 1   SYSTANE COMPLETE 0.6 % SOLN Apply 1 drop to eye 2 (two) times daily.     temazepam (RESTORIL) 7.5 MG capsule TAKE 1 CAPSULE (7.5 MG TOTAL) BY MOUTH AT BEDTIME AS NEEDED FOR SLEEP. 30 capsule 3   traMADol (ULTRAM) 50 MG tablet Take 1 tablet (50 mg total) by mouth every 8 (eight) hours as needed for severe pain. 15 tablet 0   ursodiol (ACTIGALL) 300 MG capsule TAKE 1 CAPSULE BY MOUTH EVERY DAY 30 capsule 1   XIFAXAN 550 MG TABS tablet TAKE 1 TABLET BY MOUTH TWICE A DAY 60 tablet 3   No current facility-administered medications for this visit.    Medication Side Effects: None  Allergies:  Allergies  Allergen Reactions   Oxycodone Nausea And Vomiting   Crestor [Rosuvastatin]     Abdominal pain     Glipizide Other (See Comments)    Stomach pain    Past Medical History:  Diagnosis Date   Alcohol abuse, unspecified    Breast cancer ([place]) 1998   Right   Cataract    left eye   Cervical spondylosis 2006   MRI   Degeneration of cervical intervertebral disc 2006   MRI   Diabetes mellitus without complication (HCC)    Diverticulosis of colon (without mention of hemorrhage)    Hyperpotassemia    Lung cancer (HCC)    Microscopic hematuria    Mononeuritis of unspecified site    Nonspecific abnormal results of liver function study    Other abnormal glucose    Other and unspecified hyperlipidemia    no per pt   Other chronic nonalcoholic liver disease    Personal history of chemotherapy    Personal history of malignant neoplasm of breast    Personal history of radiation therapy    Pneumothorax, acute    right, spontaneous    Tobacco use disorder    Unspecified vitamin D deficiency     Family History  Problem Relation Age of Onset   Heart failure Father    Heart attack Father    Colon cancer Maternal Uncle    Stroke Mother    Esophageal cancer Neg Hx    Rectal cancer Neg Hx    Stomach cancer Neg Hx    Pancreatic cancer Neg Hx     Social History   Socioeconomic History   Marital status: Single    Spouse name: Not on file   Number of children: 1   Years of education: Not on file   Highest education level: Not on file  Occupational History   Occupation: retired  Employer: [employer]  Tobacco Use   Smoking status: Every Day    Packs/day: 1.50    Years: 58.00    Pack years: 87.00    Types: Cigarettes   Smokeless tobacco: Never  Vaping Use   Vaping Use: Never used  Substance and Sexual Activity   Alcohol use: Not Currently   Drug use: No   Sexual activity: Not Currently  Other Topics Concern   Not on file  Social History Narrative   Divorced      1 child      Works at [employer] Strain: Low Risk    Difficulty of Paying Living Expenses: Not hard at all  Food Insecurity: No Food Insecurity   Worried About Charity fundraiser in the Last Year: Never true   Arboriculturist in the Last Year: Never true  Transportation Needs: No Transportation Needs   Lack of Transportation (Medical): No   Lack of Transportation (Non-Medical): No  Physical Activity: Inactive   Days of Exercise per Week: 0 days   Minutes of Exercise per Session: 0 min  Stress: No Stress Concern Present   Feeling of Stress : Not at all  Social Connections: Not on file  Intimate Partner Violence: Not At Risk   Fear of Current or Ex-Partner: No   Emotionally Abused: No   Physically Abused: No   Sexually Abused: No    Past Medical History, Surgical history, Social history, and Family history were reviewed and updated as appropriate.   Please  see review of systems for further details on the patient's review from today.   Objective:   Physical Exam:  LMP  (LMP Unknown)   Physical Exam Constitutional:      General: She is not in acute distress. Musculoskeletal:        General: No deformity.  Neurological:     Mental Status: She is alert and oriented to person, place, and time.     Coordination: Coordination normal.  Psychiatric:        Attention and Perception: Attention and perception normal. She does not perceive auditory or visual hallucinations.        Mood and Affect: Mood normal. Mood is not anxious or depressed. Affect is not labile, blunt, angry or inappropriate.        Speech: Speech normal.        Behavior: Behavior normal.        Thought Content: Thought content normal. Thought content is not paranoid or delusional. Thought content does not include homicidal or suicidal ideation. Thought content does not include homicidal or suicidal plan.        Cognition and Memory: Cognition and memory normal.        Judgment: Judgment normal.     Comments: Insight intact    Lab Review:     Component Value Date/Time   NA 137 11/07/2020 0814   NA 140 12/11/2019 1617   K 4.2 11/07/2020 0814   CL 100 11/07/2020 0814   CO2 29 11/07/2020 0814   GLUCOSE 102 (H) 11/07/2020 0814   BUN 16 11/07/2020 0814   BUN 35 (H) 12/11/2019 1617   CREATININE 0.82 11/07/2020 0814   CALCIUM 9.6 11/07/2020 0814   PROT 6.8 11/07/2020 0814   PROT 7.7 12/11/2019 1617   ALBUMIN 4.0 11/07/2020 0814   ALBUMIN 5.0 (H) 12/11/2019 1617   AST 17 11/07/2020  0814   ALT 8 11/07/2020 0814   ALKPHOS 77 11/07/2020 0814   BILITOT 0.6 11/07/2020 0814   BILITOT 0.7 12/11/2019 1617   GFRNONAA 72 12/11/2019 1617   GFRAA 83 12/11/2019 1617       Component Value Date/Time   WBC 5.7 02/25/2021 0924   WBC 8.5 11/07/2020 0814   RBC 3.87 02/25/2021 0924   HGB 11.1 (L) 02/25/2021 0924   HGB 12.4 12/11/2019 1617   HCT 35.4 (L) 02/25/2021 0924   HCT 39.3  12/11/2019 1617   PLT 145 (L) 02/25/2021 0924   PLT 120 (L) 12/11/2019 1617   MCV 91.5 02/25/2021 0924   MCV 86 12/11/2019 1617   MCH 28.7 02/25/2021 0924   MCHC 31.4 02/25/2021 0924   RDW 18.7 (H) 02/25/2021 0924   RDW 17.0 (H) 12/11/2019 1617   LYMPHSABS 1.0 02/25/2021 0924   MONOABS 0.4 02/25/2021 0924   EOSABS 0.1 02/25/2021 0924   BASOSABS 0.0 02/25/2021 0924    No results found for: POCLITH, LITHIUM   No results found for: PHENYTOIN, PHENOBARB, VALPROATE, CBMZ   .res Assessment: Plan:    Plan:  PDMP reviewed  1. Tranxene 3.75 BID prn anxiety  RTC 6 months  Patient advised to contact office with any questions, adverse effects, or acute worsening in signs and symptoms.  Discussed potential benefits, risk, and side effects of benzodiazepines to include potential risk of tolerance and dependence, as well as possible drowsiness. Advised patient not to drive if experiencing drowsiness and to take lowest possible effective dose to minimize risk of dependence and tolerance.    Diagnoses and all orders for this visit:  Insomnia, unspecified type -     clorazepate (TRANXENE) 3.75 MG tablet; Take 1 tablet (3.75 mg total) by mouth 2 (two) times daily.  Generalized anxiety disorder -     clorazepate (TRANXENE) 3.75 MG tablet; Take 1 tablet (3.75 mg total) by mouth 2 (two) times daily.   Please see After Visit Summary for patient specific instructions.  Future Appointments  Date Time Provider [place]  03/24/2021 10:30 AM [id number] None  03/26/2021  9:30 AM [id number] None  03/31/2021  9:30 AM [id number] None  04/14/2021  3:00 PM Tyler Pita, MD [facility] None  05/15/2021  2:00 PM Tower, [name], MD [facility]  05/27/2021  9:30 AM [facility] None  05/27/2021 10:00 AM Curt Bears, MD [facility] None    No orders of the defined types were placed in this encounter.      -------------------------------

## 2021-03-24 ENCOUNTER — Ambulatory Visit: Payer: Medicare HMO | Admitting: Pulmonary Disease

## 2021-03-24 ENCOUNTER — Ambulatory Visit
Admission: RE | Admit: 2021-03-24 | Discharge: 2021-03-24 | Disposition: A | Discharge status: Home / Self Care | Payer: Medicare HMO | Source: Ambulatory Visit | Attending: Radiation Oncology | Admitting: Radiation Oncology

## 2021-03-24 DIAGNOSIS — C3431 Malignant neoplasm of lower lobe, right bronchus or lung: Secondary | ICD-10-CM | POA: Diagnosis not present

## 2021-03-24 DIAGNOSIS — Z510 Encounter for antineoplastic radiation therapy: Secondary | ICD-10-CM | POA: Diagnosis not present

## 2021-03-26 ENCOUNTER — Ambulatory Visit
Admission: RE | Admit: 2021-03-26 | Discharge: 2021-03-26 | Disposition: A | Discharge status: Home / Self Care | Payer: Medicare HMO | Source: Ambulatory Visit | Attending: Radiation Oncology | Admitting: Radiation Oncology

## 2021-03-26 DIAGNOSIS — Z510 Encounter for antineoplastic radiation therapy: Secondary | ICD-10-CM | POA: Diagnosis not present

## 2021-03-26 DIAGNOSIS — C3431 Malignant neoplasm of lower lobe, right bronchus or lung: Secondary | ICD-10-CM | POA: Diagnosis not present

## 2021-03-28 ENCOUNTER — Encounter: Payer: Self-pay | Admitting: Gastroenterology

## 2021-03-31 ENCOUNTER — Ambulatory Visit
Admission: RE | Admit: 2021-03-31 | Discharge: 2021-03-31 | Disposition: A | Discharge status: Home / Self Care | Payer: Medicare HMO | Source: Ambulatory Visit | Attending: Radiation Oncology | Admitting: Radiation Oncology

## 2021-03-31 DIAGNOSIS — Z510 Encounter for antineoplastic radiation therapy: Secondary | ICD-10-CM | POA: Diagnosis not present

## 2021-03-31 DIAGNOSIS — C3431 Malignant neoplasm of lower lobe, right bronchus or lung: Secondary | ICD-10-CM | POA: Diagnosis not present

## 2021-03-31 DIAGNOSIS — R918 Other nonspecific abnormal finding of lung field: Secondary | ICD-10-CM | POA: Diagnosis not present

## 2021-04-01 ENCOUNTER — Ambulatory Visit: Payer: Medicare HMO | Admitting: Family Medicine

## 2021-04-14 ENCOUNTER — Other Ambulatory Visit: Payer: Self-pay

## 2021-04-14 ENCOUNTER — Encounter: Payer: Self-pay | Admitting: Pulmonary Disease

## 2021-04-14 ENCOUNTER — Ambulatory Visit: Payer: Medicare HMO | Admitting: Pulmonary Disease

## 2021-04-14 VITALS — BP 100/62 | HR 71 | Temp 97.3°F | Ht 64.5 in | Wt 112.4 lb

## 2021-04-14 DIAGNOSIS — K7030 Alcoholic cirrhosis of liver without ascites: Secondary | ICD-10-CM | POA: Diagnosis not present

## 2021-04-14 DIAGNOSIS — F17210 Nicotine dependence, cigarettes, uncomplicated: Secondary | ICD-10-CM

## 2021-04-14 DIAGNOSIS — J449 Chronic obstructive pulmonary disease, unspecified: Secondary | ICD-10-CM

## 2021-04-14 DIAGNOSIS — C3411 Malignant neoplasm of upper lobe, right bronchus or lung: Secondary | ICD-10-CM

## 2021-04-14 MED ORDER — SPIRIVA RESPIMAT 2.5 MCG/ACT IN AERS: 2.0000 | INHALATION_SPRAY | Freq: Every day | RESPIRATORY_TRACT | 0 refills | Status: DC

## 2021-04-14 NOTE — Patient Instructions (Signed)
We are giving you a trial of Spiriva which is an inhaler that you can use with the Symbicort as a maintenance medication.  You may still use your rescue inhaler as needed.  The Spiriva is only once a day.  I recommend you use the Symbicort first and then follow with the Spiriva choose whether you want to start using it in the evening or the morning and stay consistent with the time.  Let us know how the Spiriva works for you.  This way we can call it into your pharmacy.  The cough you are experiencing is a normal process after radiation for the spot you had in your lung.  This will subside in another week or 2.  We will see you in follow-up in 3 months time call sooner should any new problems arise.

## 2021-04-14 NOTE — Progress Notes (Signed)
Subjective:    Patient ID: Christine Barton, female    DOB: 1950/08/25, 71 y.o.   MRN: [id number] Chief Complaint  Patient presents with   Follow-up    Lung Nodule f/u-    HPI The patient is a 71 year old current smoker (0.5 PPD) who presents for follow-up of an 8 mm right upper lobe nodule found incidentally on low-dose CT chest during lung cancer screening.  She was initially evaluated on 16 February 2021.  The patient had had this abnormality followed previously imaged in October 2021 at that time being only 3.7 mm.  The nodule is on the same area of prior radiation changes breast cancer noted in the right lung.  PET/CT performed on 2 November shows FDG avidity in this nodule.  There is no evidence of other abnormal uptake in the chest with the exception of the aforementioned area of scarring.  During her evaluation previously she opted to forego invasive procedures and go directly to SBRT due to her multiple comorbidities.  She has now completed SBRT to this area.  She has noted some increased cough since her SBRT completed however this is expected after SBRT and she was advised that this may last 1 to 2 weeks post treatment.  She has not had any hemoptysis.  No purulent sputum production.  Overall feels well.  At her prior visit we had given her a trial of Breztri however she felt that this was causing her to cough more but now is not sure if this was due to her SBRT.  She is back in using Symbicort and is using it now regularly whereas as before she was using it just as needed.  She has not had any fevers, chills or sweats.  No chest pain.  Review of Systems A 10 point review of systems was performed and it is as noted above otherwise negative.  Patient Active Problem List   Diagnosis Date Noted   Lung cancer ([place]) 02/25/2021   Iron deficiency anemia 01/01/2021   Iron deficiency anemia due to chronic blood loss 11/06/2020   AVM (arteriovenous malformation) of colon    Benign neoplasm of  colon    NASH (nonalcoholic steatohepatitis)    Malnutrition ([place]) 07/25/2019   Symptomatic anemia 07/25/2019   Tobacco abuse 07/25/2019   Mass of soft tissue of face 03/06/2019   HSV infection 02/23/2019   Aortic atherosclerosis ([place]) 01/22/2019   Coronary atherosclerosis 01/22/2019   Encounter for screening for lung cancer 01/04/2019   Low back pain 12/07/2018   Venous insufficiency 11/15/2018   Esophageal varices without bleeding ([place]) 10/27/2018   Anemia 10/23/2018   Fatigue 10/23/2018   Poor balance 05/30/2018   Generalized weakness 05/30/2018   Cirrhosis of liver ([place]) 04/24/2018   Ascites 04/24/2018   Gallstones 04/24/2018   Heme positive stool 04/24/2018   Screening mammogram, encounter for 11/07/2017   Smoker 11/07/2017   Screening examination for STD (sexually transmitted disease) 11/11/2016   Welcome to Medicare preventive visit 10/26/2016   Estrogen deficiency 10/26/2016   Colon cancer screening 11/01/2014   Encounter for routine gynecological examination 09/12/2013   Rapid heart beat 10/10/2012   Routine general medical examination at a health care facility 07/21/2011   Vitamin D deficiency 08/04/2009   POSTMENOPAUSAL STATUS 08/04/2009   Controlled diabetes mellitus type 2 with complications ([place]) 74/16/3845   Hyperlipidemia associated with type 2 diabetes mellitus ([place]) 06/14/2008   [place] DISEASE, CERVICAL 03/30/2007   History of alcohol abuse 12/06/2006   Neuropathy  of both feet 12/06/2006   DIVERTICULOSIS, COLON 12/06/2006   Fatty liver 12/06/2006   BREAST CANCER, HX OF 12/06/2006   Social History   Tobacco Use   Smoking status: Every Day    Packs/day: 1.50    Years: 58.00    Pack years: 87.00    Types: Cigarettes   Smokeless tobacco: Never   Tobacco comments:    0.5 ppd - 04/14/21  Substance Use Topics   Alcohol use: Not Currently   Allergies  Allergen Reactions   Oxycodone Nausea And Vomiting   Crestor [Rosuvastatin]     Abdominal pain      Glipizide Other (See Comments)    Stomach pain   Current Meds  Medication Sig   albuterol (VENTOLIN HFA) 108 (90 Base) MCG/ACT inhaler INHALE 2 PUFFS INTO THE LUNGS EVERY 4 HOURS AS NEEDED FOR WHEEZE OR FOR SHORTNESS OF BREATH   budesonide-formoterol (SYMBICORT) 160-4.5 MCG/ACT inhaler TAKE 2 PUFFS BY MOUTH TWICE A DAY   cholecalciferol (VITAMIN D3) 25 MCG (1000 UNIT) tablet Take 1,000 Units by mouth 2 (two) times daily.   clorazepate (TRANXENE) 3.75 MG tablet Take 1 tablet (3.75 mg total) by mouth 2 (two) times daily.   cyclobenzaprine (FLEXERIL) 10 MG tablet TAKE 1/2 TABLET BY MOUTH AT BEDTIME AS NEEDED FOR MUSCLE SPASMS   furosemide (LASIX) 20 MG tablet TAKE 1 TABLET BY MOUTH EVERY DAY   lactulose (CHRONULAC) 10 GM/15ML solution Take 15 mLs (10 g total) by mouth 3 (three) times daily.   Lidocaine (HM LIDOCAINE PATCH) 4 % PTCH Apply to affected area as needed   metFORMIN (GLUCOPHAGE-XR) 500 MG 24 hr tablet TAKE 1 TABLET BY MOUTH EVERY DAY WITH BREAKFAST   Multiple Vitamin (MULTIVITAMIN) tablet Take 1 tablet by mouth daily.   Multiple Vitamins-Minerals (PRESERVISION AREDS 2) CAPS Take 1 capsule by mouth 2 (two) times daily.   mupirocin ointment (BACTROBAN) 2 % APPLY 1 APPLICATION TOPICALLY 2 (TWO) TIMES DAILY. TO AFFECTED AREAS   nadolol (CORGARD) 20 MG tablet TAKE 1 TABLET BY MOUTH EVERY DAY   nystatin (MYCOSTATIN) 100000 UNIT/ML suspension TAKE 5 MLS (500,000 UNITS TOTAL) BY MOUTH 3 (THREE) TIMES DAILY. SWISH AND SWALLOW   pantoprazole (PROTONIX) 40 MG tablet TAKE 1 TABLET (40 MG TOTAL) BY MOUTH 2 (TWO) TIMES DAILY BEFORE A MEAL.   spironolactone (ALDACTONE) 25 MG tablet TAKE 1 TABLET BY MOUTH EVERY DAY   SYSTANE COMPLETE 0.6 % SOLN Apply 1 drop to eye 2 (two) times daily.   temazepam (RESTORIL) 7.5 MG capsule TAKE 1 CAPSULE (7.5 MG TOTAL) BY MOUTH AT BEDTIME AS NEEDED FOR SLEEP.   Tiotropium Bromide Monohydrate (SPIRIVA RESPIMAT) 2.5 MCG/ACT AERS Inhale 2 puffs into the lungs daily.    traMADol (ULTRAM) 50 MG tablet Take 1 tablet (50 mg total) by mouth every 8 (eight) hours as needed for severe pain.   ursodiol (ACTIGALL) 300 MG capsule TAKE 1 CAPSULE BY MOUTH EVERY DAY   XIFAXAN 550 MG TABS tablet TAKE 1 TABLET BY MOUTH TWICE A DAY   [DISCONTINUED] budesonide-formoterol (SYMBICORT) 160-4.5 MCG/ACT inhaler Inhale 2 puffs into the lungs 2 (two) times daily.   Immunization History  Administered Date(s) Administered   Fluad Quad(high Dose 65+) 12/04/2018, 01/19/2020   Hep A / Hep B 09/05/2018, 10/10/2018, 03/29/2019   Influenza Split 03/10/2011   Influenza Whole 02/11/2000, 02/06/2016   Influenza, High Dose Seasonal PF 02/03/2021   Influenza,inj,Quad PF,6+ Mos 02/16/2017, 04/18/2018   Influenza-Unspecified 02/11/2016   PFIZER(Purple Top)SARS-COV-2 Vaccination 05/21/2019, 06/16/2019, 02/13/2020  Pension scheme manager 32yr & up 02/10/2021   Pneumococcal Conjugate-13 10/26/2016   Pneumococcal Polysaccharide-23 06/28/2008, 11/07/2017   Td 05/21/2002   Tdap 09/12/2013   Zoster, Live 08/24/2011       Objective:   Physical Exam BP 100/62 (BP Location: Left Arm, Patient Position: Sitting, Cuff Size: Normal)    Pulse 71    Temp (!) 97.3 F (36.3 C) (Oral)    Ht 5' 4.5" (1.638 m)    Wt 112 lb 6.4 oz (51 kg)    LMP  (LMP Unknown)    SpO2 100%    BMI 19.00 kg/m  GENERAL: Thin, frail woman, no acute distress, fully ambulatory, no conversational dyspnea. HEAD: Normocephalic, atraumatic.  EYES: Pupils equal, round, reactive to light.  No scleral icterus.  MOUTH: Nose/mouth/throat not examined due to masking requirements for COVID 19. NECK: Supple. No thyromegaly. Trachea midline. No JVD.  No adenopathy. PULMONARY: Good air entry bilaterally.  Coarse breath sounds, diffuse end expiratory wheezes. CARDIOVASCULAR: S1 and S2. Regular rate and rhythm.  ABDOMEN: Scaphoid otherwise benign. MUSCULOSKELETAL: No joint deformity, no clubbing, no edema.  NEUROLOGIC:  Grossly nonfocal.  Speech fluent. SKIN: Intact,warm,dry. PSYCH: Mood and behavior normal.      Assessment & Plan:     ICD-10-CM   1. COPD suggested by initial evaluation ([place]  J44.9    Continue Symbicort 160/4.5, 2 inhalations twice a day Continue as needed albuterol Trial of additional Spiriva Respimat 2 inhalations daily Reassess for PFTs    2. Malignant neoplasm of upper lobe of right lung (HCC)  C34.11    Status post SBRT Suspect non-small cell CA Patient not a surgical candidate    3. Alcoholic cirrhosis of liver without ascites (HCC)  K70.30    This issue adds complexity to her management Followed by GI    4. Tobacco dependence due to cigarettes  F17.210    She is currently engaged in efforts to quit smoking Down to a half a pack of cigarettes per day Commended on her efforts     Meds ordered this encounter  Medications   Tiotropium Bromide Monohydrate (SPIRIVA RESPIMAT) 2.5 MCG/ACT AERS    Sig: Inhale 2 puffs into the lungs daily.    Dispense:  4 g    Refill:  0    Order Specific Question:   Lot Number?    Answer:   [id number]   Order Specific Question:   Expiration Date?    Answer:   05/13/2022    Order Specific Question:   Quantity    Answer:   3    We will see the patient in follow-up in 3 months time, she is to contact u[place]oreaprior to that time should any new difficulties arise.  She is to let uKoreaknow if Spiriva helps her with her shortness of breath symptoms from COPD.  CRenold Don MD Advanced Bronchoscopy PCCM [facility]    *This note was dictated using voice recognition software/Dragon.  Despite best efforts to proofread, errors can occur which can change the meaning. Any transcriptional errors that result from this process are unintentional and may not be fully corrected at the time of dictation.

## 2021-04-16 ENCOUNTER — Encounter: Payer: Self-pay | Admitting: Pulmonary Disease

## 2021-04-17 ENCOUNTER — Other Ambulatory Visit: Payer: Self-pay | Admitting: Family Medicine

## 2021-04-20 NOTE — Telephone Encounter (Signed)
Last filled on 11/11/20 #120 ml with 0 refills, f/u scheduled on 05/15/21

## 2021-04-21 ENCOUNTER — Telehealth: Payer: Self-pay

## 2021-04-21 NOTE — Telephone Encounter (Signed)
This message is from patient's daughter Meredith Mody (she sent this in her mychart and I have input this information in the patient's chart for proper documentation) "I need to know if mamas breast cancer was hormone receptor positive.  My integrative health doctor needs to know for my future treatment of Lyme.  Mama said she put me on her records as being able to have access to them.  What do I need to do to find out this information?  Her oncologist is no longer in practice.  It was Lenis Livasay or something like that.  She was with [facility].  Thank you!    Gwen [phone]"

## 2021-04-21 NOTE — Telephone Encounter (Signed)
I think it was hormone receptor positive because she took tamoxifen in the past

## 2021-04-24 NOTE — Telephone Encounter (Signed)
Spoke to pts daughter, Meredith Mody ([place]), and relayed Dr. Alba Cory message. Dtr states that she will pass this along to her dr.

## 2021-05-11 ENCOUNTER — Other Ambulatory Visit: Payer: Self-pay | Admitting: *Deleted

## 2021-05-11 ENCOUNTER — Encounter: Payer: Self-pay | Admitting: Radiation Oncology

## 2021-05-11 ENCOUNTER — Other Ambulatory Visit: Payer: Self-pay

## 2021-05-11 ENCOUNTER — Ambulatory Visit
Admission: RE | Admit: 2021-05-11 | Discharge: 2021-05-11 | Disposition: A | Discharge status: Home / Self Care | Payer: Medicare HMO | Source: Ambulatory Visit | Attending: Radiation Oncology | Admitting: Radiation Oncology

## 2021-05-11 VITALS — BP 130/67 | HR 70 | Resp 18 | Ht 64.0 in | Wt 115.6 lb

## 2021-05-11 DIAGNOSIS — Z923 Personal history of irradiation: Secondary | ICD-10-CM | POA: Insufficient documentation

## 2021-05-11 DIAGNOSIS — C3490 Malignant neoplasm of unspecified part of unspecified bronchus or lung: Secondary | ICD-10-CM

## 2021-05-11 DIAGNOSIS — R918 Other nonspecific abnormal finding of lung field: Secondary | ICD-10-CM | POA: Insufficient documentation

## 2021-05-11 NOTE — Progress Notes (Signed)
Radiation Oncology Follow up Note  Name: Christine Barton   Date:   05/11/2021 MRN:  [id number] DOB: 1950-11-27    This 71 y.o. female presents to the clinic today for 1 month follow-up status post SBRT to superior segment of the right lower lobe for presumed stage I non-small cell lung cancer.  REFERRING PROVIDER: Tower, [name], MD  HPI: Patient is a 71 year old female.  Now at 1 month having completed SBRT for T1 a N0 non-small cell lung cancer hypermetabolic on PET scan.  She was presented at tumor board with recommendation for SBRT patient had declined biopsy.  Seen today in follow-up she is doing well she has a slight nonproductive cough no hemoptysis chest tightness or dysphagia.  COMPLICATIONS OF TREATMENT: none  FOLLOW UP COMPLIANCE: keeps appointments   PHYSICAL EXAM:  BP 130/67    Pulse 70    Resp 18    Ht 5' 4"  (1.626 m)    Wt 115 lb 9.6 oz (52.4 kg)    LMP  (LMP Unknown)    BMI 19.84 kg/m  Well-developed well-nourished patient in NAD. HEENT reveals PERLA, EOMI, discs not visualized.  Oral cavity is clear. No oral mucosal lesions are identified. Neck is clear without evidence of cervical or supraclavicular adenopathy. Lungs are clear to A&P. Cardiac examination is essentially unremarkable with regular rate and rhythm without murmur rub or thrill. Abdomen is benign with no organomegaly or masses noted. Motor sensory and DTR levels are equal and symmetric in the upper and lower extremities. Cranial nerves II through XII are grossly intact. Proprioception is intact. No peripheral adenopathy or edema is identified. No motor or sensory levels are noted. Crude visual fields are within normal range.  RADIOLOGY RESULTS: No current films reviewed CT scan ordered in 3 months  PLAN: Present time patient is doing well very low side effect profile from SBRT.  I have asked to see her back in 3 months with a follow-up CT scan of the chest at that time.  Patient knows to call with any concerns.  I  would like to take this opportunity to thank you for allowing me to participate in the care of your patient.Noreene Filbert, MD

## 2021-05-15 ENCOUNTER — Ambulatory Visit: Payer: Medicare HMO | Admitting: Family Medicine

## 2021-05-19 ENCOUNTER — Other Ambulatory Visit: Payer: Self-pay | Admitting: Gastroenterology

## 2021-05-19 ENCOUNTER — Other Ambulatory Visit: Payer: Self-pay | Admitting: Family Medicine

## 2021-05-20 NOTE — Telephone Encounter (Signed)
F/u scheduled on 05/29/21, last filled on 03/17/20 #30 caps with 3 refills

## 2021-05-25 ENCOUNTER — Telehealth: Payer: Self-pay | Admitting: Cardiology

## 2021-05-25 NOTE — Telephone Encounter (Signed)
Spoke with pt regarding occasional chest discomfort that comes after exercising. Pt states that this has happened twice after fast paced walking with her daughter. Pt states that it is on the left side of her chest and it is causing her to be concerned. Pt states that she has been exercising for the last few weeks but this has happened twice. Pt would like to come in for a visit and discuss with Dr. Gardiner Rhyme. Appointment scheduled for pt. Pt verbalizes understanding.

## 2021-05-25 NOTE — Telephone Encounter (Signed)
Pt c/o of Chest Pain: STAT if CP now or developed within 24 hours  1. Are you having CP right now?  Yes, on left side   2. Are you experiencing any other symptoms (ex. SOB, nausea, vomiting, sweating)?  No   3. How long have you been experiencing CP? About 3 days, becomes worse with exercise  4. Is your CP continuous or coming and going?  Continuous   5. Have you taken Nitroglycerin?  No  ?

## 2021-05-25 NOTE — Telephone Encounter (Signed)
Call transferred to [place], [place].

## 2021-05-25 NOTE — Progress Notes (Signed)
Cardiology Office Note:    Date:  05/26/2021   ID:  Christine Barton, DOB [birth year], MRN [id number]  PCP:  [name], MD  Cardiologist:  Donato Heinz, MD  Electrophysiologist:  None   Referring MD: [name], MD   Chief Complaint  Patient presents with   Chest Pain    Chest pain stared 4 days with exercise it was hurt a lot, past two days pain has gone down. However still having chest pain    History of Present Illness:    Christine Barton is a 72 y.o. female with a hx of cirrhosis c/b esophageal varices, tobacco use, hyperlipidemia, type 2 diabetes who presents for follow-up.  She was referred by Dr. Glori Bickers for evaluation of coronary artery calcifications seen on chest CT, with initial appointment on 02/13/2019.  She underwent a chest CT for lung cancer screening on 01/18/2019, which showed multivessel coronary artery calcifications.  Patient denies any chest pain.  States that her activity has been limited by back pain but has been doing physical therapy.  She does report intermittent dyspnea with exertion, which she states improves with inhaler use.  She continues to smoke 1 pack/day.  Had been taken off diuretics but had to restart.  TTE on 02/21/2019 showed normal LV systolic function, normal RV function, no significant valvular disease.  Lexiscan Myoview on 02/15/2020 showed EF 55%, normal perfusion.  Since her last clinic visit, she reports that she has been having chest pain.  She started walking a few weeks ago and began having chest pain the next day.  She denies any chest pain with walking, but reports the following day she had pain which she describes as pressure on the left side of her chest.  Would last for days.  Worse with lying on left side.  Better with lying flat.  Reports pain was constant x4 days but improved since yesterday.  She denies any dyspnea, lightheadedness, syncope, lower extremity edema, or palpitations.  She continues to smoke, 0.5  packs/day.  Past Medical History:  Diagnosis Date   Alcohol abuse, unspecified    Breast cancer ([facility]) 1998   Right   Cataract    left eye   Cervical spondylosis 2006   MRI   Degeneration of cervical intervertebral disc 2006   MRI   Diabetes mellitus without complication (HCC)    Diverticulosis of colon (without mention of hemorrhage)    Hyperpotassemia    Lung cancer (HCC)    Microscopic hematuria    Mononeuritis of unspecified site    Nonspecific abnormal results of liver function study    Other abnormal glucose    Other and unspecified hyperlipidemia    no per pt   Other chronic nonalcoholic liver disease    Personal history of chemotherapy    Personal history of malignant neoplasm of breast    Personal history of radiation therapy    Pneumothorax, acute    right, spontaneous   Tobacco use disorder    Unspecified vitamin D deficiency     Past Surgical History:  Procedure Laterality Date   BIOPSY  07/27/2019   Procedure: BIOPSY;  Surgeon: Yetta Flock, MD;  Location: [facility];  Service: Gastroenterology;;   BREAST BIOPSY  9/03   Right   BREAST LUMPECTOMY Right 1998   CHEST TUBE INSERTION  11/02/2014   COLONOSCOPY     COLONOSCOPY WITH PROPOFOL N/A 07/27/2019   Procedure: COLONOSCOPY WITH PROPOFOL;  Surgeon: Yetta Flock, MD;  Location: [facility];  Service: Gastroenterology;  Laterality: N/A;   ESOPHAGOGASTRODUODENOSCOPY (EGD) WITH PROPOFOL N/A 10/30/2018   Procedure: ESOPHAGOGASTRODUODENOSCOPY (EGD) WITH PROPOFOL;  Surgeon: Mauri Pole, MD;  Location: [facility];  Service: Endoscopy;  Laterality: N/A;   ESOPHAGOGASTRODUODENOSCOPY (EGD) WITH PROPOFOL N/A 03/12/2019   Procedure: ESOPHAGOGASTRODUODENOSCOPY (EGD) WITH PROPOFOL;  Surgeon: Mauri Pole, MD;  Location: [facility];  Service: Endoscopy;  Laterality: N/A;   ESOPHAGOGASTRODUODENOSCOPY (EGD) WITH PROPOFOL N/A 07/27/2019   Procedure: ESOPHAGOGASTRODUODENOSCOPY (EGD) WITH  PROPOFOL;  Surgeon: Yetta Flock, MD;  Location: [facility];  Service: Gastroenterology;  Laterality: N/A;   EYE SURGERY  02/2017   cataract extraction with lens implant-left   HOT HEMOSTASIS N/A 07/27/2019   Procedure: HOT HEMOSTASIS (ARGON PLASMA COAGULATION/BICAP);  Surgeon: Yetta Flock, MD;  Location: [facility];  Service: Gastroenterology;  Laterality: N/A;   MOUTH SURGERY     POLYPECTOMY  07/27/2019   Procedure: POLYPECTOMY;  Surgeon: Yetta Flock, MD;  Location: [facility];  Service: Gastroenterology;;   TUBAL LIGATION      Current Medications: Current Meds  Medication Sig   albuterol (VENTOLIN HFA) 108 (90 Base) MCG/ACT inhaler INHALE 2 PUFFS INTO THE LUNGS EVERY 4 HOURS AS NEEDED FOR WHEEZE OR FOR SHORTNESS OF BREATH   budesonide-formoterol (SYMBICORT) 160-4.5 MCG/ACT inhaler TAKE 2 PUFFS BY MOUTH TWICE A DAY   cholecalciferol (VITAMIN D3) 25 MCG (1000 UNIT) tablet Take 1,000 Units by mouth 2 (two) times daily.   clorazepate (TRANXENE) 3.75 MG tablet Take 1 tablet (3.75 mg total) by mouth 2 (two) times daily.   cyclobenzaprine (FLEXERIL) 10 MG tablet TAKE 1/2 TABLET BY MOUTH AT BEDTIME AS NEEDED FOR MUSCLE SPASMS   furosemide (LASIX) 20 MG tablet TAKE 1 TABLET BY MOUTH EVERY DAY   lactulose (CHRONULAC) 10 GM/15ML solution Take 15 mLs (10 g total) by mouth 3 (three) times daily.   Lidocaine (HM LIDOCAINE PATCH) 4 % PTCH Apply to affected area as needed   metFORMIN (GLUCOPHAGE-XR) 500 MG 24 hr tablet TAKE 1 TABLET BY MOUTH EVERY DAY WITH BREAKFAST   metoprolol tartrate (LOPRESSOR) 50 MG tablet Take 2 hours before CT scan   Multiple Vitamin (MULTIVITAMIN) tablet Take 1 tablet by mouth daily.   Multiple Vitamins-Minerals (PRESERVISION AREDS 2) CAPS Take 1 capsule by mouth 2 (two) times daily.   mupirocin ointment (BACTROBAN) 2 % APPLY 1 APPLICATION TOPICALLY 2 (TWO) TIMES DAILY. TO AFFECTED AREAS   nadolol (CORGARD) 20 MG tablet TAKE 1 TABLET BY MOUTH  EVERY DAY   nystatin (MYCOSTATIN) 100000 UNIT/ML suspension TAKE 5 MLS (500,000 UNITS TOTAL) BY MOUTH 3 (THREE) TIMES DAILY. SWISH AND SWALLOW   pantoprazole (PROTONIX) 40 MG tablet TAKE 1 TABLET (40 MG TOTAL) BY MOUTH 2 (TWO) TIMES DAILY BEFORE A MEAL.   spironolactone (ALDACTONE) 25 MG tablet TAKE 1 TABLET BY MOUTH EVERY DAY   SYSTANE COMPLETE 0.6 % SOLN Apply 1 drop to eye 2 (two) times daily.   temazepam (RESTORIL) 7.5 MG capsule TAKE 1 CAPSULE (7.5 MG TOTAL) BY MOUTH AT BEDTIME AS NEEDED FOR SLEEP.   Tiotropium Bromide Monohydrate (SPIRIVA RESPIMAT) 2.5 MCG/ACT AERS Inhale 2 puffs into the lungs daily.   traMADol (ULTRAM) 50 MG tablet Take 1 tablet (50 mg total) by mouth every 8 (eight) hours as needed for severe pain.   ursodiol (ACTIGALL) 300 MG capsule TAKE 1 CAPSULE BY MOUTH EVERY DAY   XIFAXAN 550 MG TABS tablet TAKE 1 TABLET BY MOUTH TWICE A DAY  Allergies:   Oxycodone, Crestor [rosuvastatin], and Glipizide   Social History   Socioeconomic History   Marital status: Single    Spouse name: Not on file   Number of children: 1   Years of education: Not on file   Highest education level: Not on file  Occupational History   Occupation: retired    Fish farm manager: [employer]  Tobacco Use   Smoking status: Every Day    Packs/day: 1.50    Years: 58.00    Pack years: 87.00    Types: Cigarettes   Smokeless tobacco: Never   Tobacco comments:    0.5 ppd - 04/14/21  Vaping Use   Vaping Use: Never used  Substance and Sexual Activity   Alcohol use: Not Currently   Drug use: No   Sexual activity: Not Currently  Other Topics Concern   Not on file  Social History Narrative   Divorced      1 child      Works at [employer] Strain: Low Risk    Difficulty of Paying Living Expenses: Not hard at all  Food Insecurity: No Food Insecurity   Worried About Charity fundraiser in the Last Year: Never true   Youth worker in the Last Year: Never true  Transportation Needs: No Transportation Needs   Lack of Transportation (Medical): No   Lack of Transportation (Non-Medical): No  Physical Activity: Inactive   Days of Exercise per Week: 0 days   Minutes of Exercise per Session: 0 min  Stress: No Stress Concern Present   Feeling of Stress : Not at all  Social Connections: Not on file     Family History: The patient's _0 family history includes Colon cancer in her maternal uncle; Heart attack in her father; Heart failure in her father; Stroke in her mother. There is no history of Esophageal cancer, Rectal cancer, Stomach cancer, or Pancreatic cancer.  ROS:   Please see the history of present illness.    \All other systems reviewed and are negative.  EKGs/Labs/Other Studies Reviewed:    The following studies were reviewed today:   EKG:   05/26/2021: Normal sinus rhythm, rate 71, no ST abnormalities  Recent Labs: 11/07/2020: ALT 8; BUN 16; Creatinine, Ser 0.82; Potassium 4.2; Sodium 137; TSH 3.77 02/25/2021: Hemoglobin 11.1; Platelet Count 145  Recent Lipid Panel    Component Value Date/Time   CHOL 142 11/07/2020 0814   TRIG 124.0 11/07/2020 0814   HDL 34.80 (L) 11/07/2020 0814   CHOLHDL 4 11/07/2020 0814   VLDL 24.8 11/07/2020 0814   LDLCALC 82 11/07/2020 0814   LDLDIRECT 83.0 10/31/2017 0838    Physical Exam:    VS:  BP 120/60    Pulse 69    Ht 5' 4" (1.626 m)    Wt 114 lb 3.2 oz (51.8 kg)    LMP  (LMP Unknown)    SpO2 99%    BMI 19.60 kg/m     Wt Readings from Last 3 Encounters:  05/26/21 114 lb 3.2 oz (51.8 kg)  05/11/21 115 lb 9.6 oz (52.4 kg)  04/14/21 112 lb 6.4 oz (51 kg)     GEN: Cachectic, in no acute distress HEENT: Normal NECK: No JVD CARDIAC: RRR, no murmurs RESPIRATORY:  Clear to auscultation without rales, wheezing or rhonchi  ABDOMEN: Soft, non-tender, non-distended MUSCULOSKELETAL:  No edema SKIN: Warm and dry NEUROLOGIC:  Alert and oriented x 3 PSYCHIATRIC:   Normal affect   ASSESSMENT:    1. Precordial pain   2. Chest pain of uncertain etiology   3. Coronary artery disease involving native coronary artery of native heart without angina pectoris   4. Tobacco use     PLAN:      Chest pain: Description suggest musculoskeletal pain, as reports continuous pain for days and worse with certain positions.  Also worse with palpation on exam.  She does have coronary calcifications on CT chest and had Lexiscan Myoview 02/2020 with normal perfusion.  EKG unremarkable. -Given her continuous chest pain, will check troponin.  -ESR/CRP -Coronary CTA  Coronary artery disease: CT chest showed coronary calcifications.  Lexiscan Myoview on 02/15/2020 showed EF 55%, normal perfusion.  TTE on 02/21/2019 showed no structural heart disease -She appears compensated in regards to her cirrhosis.  Discussed with her hepatologist, Dr. [name], and OK to start on statin.  Started on rosuvastatin 5 mg daily in 02/2019.  LDL 57 on 05/06/2020.  LFTs have been stable.  She stopped taking it because she thought statin was causing her right upper quadrant pain, but recent ultrasound shows gallstones are likely the cause.  Recommended restarting statin, patient said she would consider -Would not start on daily aspirin given issues with GI bleeding  Type 2 diabetes: Well-controlled, A1c is normal now 5.2% on 05/06/2020  Tobacco use: Smokes 0.5ppd.  Patient counseled on risks of tobacco use and cessation strongly encouraged.    Lung cancer: Found to have right upper lobe nodule that was positive on PET scan.  Not a surgical candidate.  Status post radiation in January 2023  Cachexia: Referred to nutrition  RTC in 3 months  Medication Adjustments/Labs and Tests Ordered: Current medicines are reviewed at length with the patient today.  Concerns regarding medicines are outlined above.  Orders Placed This Encounter  Procedures   CT CORONARY MORPH W/CTA COR W/SCORE W/CA W/CM  &/OR WO/CM   Comprehensive Metabolic Panel (CMET)   Troponin T   Sed Rate (ESR)   C-reactive protein   EKG 12-Lead   Meds ordered this encounter  Medications   metoprolol tartrate (LOPRESSOR) 50 MG tablet    Sig: Take 2 hours before CT scan    Dispense:  1 tablet    Refill:  0    Patient Instructions  Medication Instructions:  Your physician recommends that you continue on your current medications as directed. Please refer to the Current Medication list given to you today.  *If you need a refill on your cardiac medications before your next appointment, please call your pharmacy*   Lab Work: Your physician recommends that you return for lab work in:  TODAY: CMET, Trop, ESR, CRP If you have labs (blood work) drawn today and your tests are completely normal, you will receive your results only by: [place] (if you have MyChart) OR A paper copy in the mail If you have any lab test that is abnormal or we need to change your treatment, we will call you to review the results.   Testing/Procedures:   Your cardiac CT will be scheduled at one of the below locations:   [facility] [address] [place], [place] [address] [phone]   If scheduled at [facility], please arrive at the [facility] main entrance (entrance A) of [facility] 30 minutes prior to test start time. You can use the FREE valet parking offered at the main  entrance (encouraged to control the heart rate for the test) Proceed to the [facility] Radiology Department (first floor) to check-in and test prep.    Please follow these instructions carefully (unless otherwise directed):    On the Night Before the Test: Be sure to Drink plenty of water. Do not consume any caffeinated/decaffeinated beverages or chocolate 12 hours prior to your test. Do not take any antihistamines 12 hours prior to your test.  On the Day of the Test: Drink plenty of water until 1 hour prior to  the test. Do not eat any food 4 hours prior to the test. You may take your regular medications prior to the test.  HOLD Nadolol (Corgard) the day of the procedure.  HOLD Furosemide morning of the test. Take metoprolol (Lopressor) 50 mg   two hours prior to test. FEMALES- please wear underwire-free bra if available, avoid dresses & tight clothing    After the Test: Drink plenty of water. After receiving IV contrast, you may experience a mild flushed feeling. This is normal. On occasion, you may experience a mild rash up to 24 hours after the test. This is not dangerous. If this occurs, you can take Benadryl 25 mg and increase your fluid intake. If you experience trouble breathing, this can be serious. If it is severe call 911 IMMEDIATELY. If it is mild, please call our office. If you take any of these medications: Glipizide/Metformin, Avandament, Glucavance, please do not take 48 hours after completing test unless otherwise instructed.  We will call to schedule your test 2-4 weeks out understanding that some insurance companies will need an authorization prior to the service being performed.   For non-scheduling related questions, please contact the cardiac imaging nurse navigator should you have any questions/concerns: Marchia Bond, Cardiac Imaging Nurse Navigator Gordy Clement, Cardiac Imaging Nurse Navigator [facility] Direct Office Dial: [phone]   For scheduling needs, including cancellations and rescheduling, please call [place], [phone].    Follow-Up: At [facility], you and your health needs are our priority.  As part of our continuing mission to provide you with exceptional heart care, we have created designated Provider Care Teams.  These Care Teams include your primary Cardiologist (physician) and Advanced Practice Providers (APPs -  Physician Assistants and Nurse Practitioners) who all work together to provide you with the care you  need, when you need it.  We recommend signing up for the patient portal called "MyChart".  Sign up information is provided on this After Visit Summary.  MyChart is used to connect with patients for Virtual Visits (Telemedicine).  Patients are able to view lab/test results, encounter notes, upcoming appointments, etc.  Non-urgent messages can be sent to your provider as well.   To learn more about what you can do with MyChart, go to [email or web address].    Your next appointment:   3 month(s)  The format for your next appointment:   In Person  Provider:   Donato Heinz, MD     Other Instructions     Signed, Donato Heinz, MD  05/26/2021 1:17 PM    [place]

## 2021-05-26 ENCOUNTER — Other Ambulatory Visit: Payer: Self-pay

## 2021-05-26 ENCOUNTER — Ambulatory Visit: Payer: Medicare HMO | Admitting: Cardiology

## 2021-05-26 ENCOUNTER — Encounter: Payer: Self-pay | Admitting: Cardiology

## 2021-05-26 VITALS — BP 120/60 | HR 69 | Ht 64.0 in | Wt 114.2 lb

## 2021-05-26 DIAGNOSIS — D500 Iron deficiency anemia secondary to blood loss (chronic): Secondary | ICD-10-CM

## 2021-05-26 DIAGNOSIS — R079 Chest pain, unspecified: Secondary | ICD-10-CM

## 2021-05-26 DIAGNOSIS — Z720 Tobacco use: Secondary | ICD-10-CM

## 2021-05-26 DIAGNOSIS — I2510 Atherosclerotic heart disease of native coronary artery without angina pectoris: Secondary | ICD-10-CM | POA: Diagnosis not present

## 2021-05-26 DIAGNOSIS — R072 Precordial pain: Secondary | ICD-10-CM | POA: Diagnosis not present

## 2021-05-26 MED ORDER — METOPROLOL TARTRATE 50 MG PO TABS: ORAL_TABLET | ORAL | 0 refills | Status: DC

## 2021-05-26 NOTE — Patient Instructions (Addendum)
Medication Instructions:  Your physician recommends that you continue on your current medications as directed. Please refer to the Current Medication list given to you today.  *If you need a refill on your cardiac medications before your next appointment, please call your pharmacy*   Lab Work: Your physician recommends that you return for lab work in:  TODAY: CMET, Trop, ESR, CRP If you have labs (blood work) drawn today and your tests are completely normal, you will receive your results only by: [place] (if you have MyChart) OR A paper copy in the mail If you have any lab test that is abnormal or we need to change your treatment, we will call you to review the results.   Testing/Procedures:   Your cardiac CT will be scheduled at one of the below locations:   [facility] [address] [place], [place] [address] [phone]   If scheduled at [facility], please arrive at the [facility] main entrance (entrance A) of [facility] 30 minutes prior to test start time. You can use the FREE valet parking offered at the main entrance (encouraged to control the heart rate for the test) Proceed to the [facility] Radiology Department (first floor) to check-in and test prep.    Please follow these instructions carefully (unless otherwise directed):    On the Night Before the Test: Be sure to Drink plenty of water. Do not consume any caffeinated/decaffeinated beverages or chocolate 12 hours prior to your test. Do not take any antihistamines 12 hours prior to your test.  On the Day of the Test: Drink plenty of water until 1 hour prior to the test. Do not eat any food 4 hours prior to the test. You may take your regular medications prior to the test.  HOLD Nadolol (Corgard) the day of the procedure.  HOLD Furosemide morning of the test. Take metoprolol (Lopressor) 50 mg   two hours prior to test. FEMALES- please wear underwire-free bra if  available, avoid dresses & tight clothing    After the Test: Drink plenty of water. After receiving IV contrast, you may experience a mild flushed feeling. This is normal. On occasion, you may experience a mild rash up to 24 hours after the test. This is not dangerous. If this occurs, you can take Benadryl 25 mg and increase your fluid intake. If you experience trouble breathing, this can be serious. If it is severe call 911 IMMEDIATELY. If it is mild, please call our office. If you take any of these medications: Glipizide/Metformin, Avandament, Glucavance, please do not take 48 hours after completing test unless otherwise instructed.  We will call to schedule your test 2-4 weeks out understanding that some insurance companies will need an authorization prior to the service being performed.   For non-scheduling related questions, please contact the cardiac imaging nurse navigator should you have any questions/concerns: Marchia Bond, Cardiac Imaging Nurse Navigator Gordy Clement, Cardiac Imaging Nurse Navigator [place] [facility] Direct Office Dial: [phone]   For scheduling needs, including cancellations and rescheduling, please call [place], [phone].    Follow-Up: At [facility], you and your health needs are our priority.  As part of our continuing mission to provide you with exceptional heart care, we have created designated Provider Care Teams.  These Care Teams include your primary Cardiologist (physician) and Advanced Practice Providers (APPs -  Physician Assistants and Nurse Practitioners) who all work together to provide you with the care you need,  when you need it.  We recommend signing up for the patient portal called "MyChart".  Sign up information is provided on this After Visit Summary.  MyChart is used to connect with patients for Virtual Visits (Telemedicine).  Patients are able to view lab/test results, encounter notes, upcoming appointments,  etc.  Non-urgent messages can be sent to your provider as well.   To learn more about what you can do with MyChart, go to [email or web address].    Your next appointment:   3 month(s)  The format for your next appointment:   In Person  Provider:   Donato Heinz, MD     Other Instructions

## 2021-05-27 ENCOUNTER — Inpatient Hospital Stay: Payer: Medicare HMO | Attending: Physician Assistant

## 2021-05-27 ENCOUNTER — Inpatient Hospital Stay: Payer: Medicare HMO | Admitting: Internal Medicine

## 2021-05-27 ENCOUNTER — Other Ambulatory Visit: Payer: Self-pay | Admitting: Internal Medicine

## 2021-05-27 VITALS — BP 123/64 | HR 83 | Temp 97.1°F | Resp 17 | Ht 64.0 in | Wt 114.3 lb

## 2021-05-27 DIAGNOSIS — K922 Gastrointestinal hemorrhage, unspecified: Secondary | ICD-10-CM | POA: Diagnosis not present

## 2021-05-27 DIAGNOSIS — D500 Iron deficiency anemia secondary to blood loss (chronic): Secondary | ICD-10-CM

## 2021-05-27 DIAGNOSIS — K5520 Angiodysplasia of colon without hemorrhage: Secondary | ICD-10-CM

## 2021-05-27 DIAGNOSIS — Z85118 Personal history of other malignant neoplasm of bronchus and lung: Secondary | ICD-10-CM | POA: Diagnosis not present

## 2021-05-27 DIAGNOSIS — Z923 Personal history of irradiation: Secondary | ICD-10-CM | POA: Diagnosis not present

## 2021-05-27 DIAGNOSIS — D509 Iron deficiency anemia, unspecified: Secondary | ICD-10-CM

## 2021-05-27 LAB — COMPREHENSIVE METABOLIC PANEL
ALT: 8 IU/L (ref 0–32)
AST: 20 IU/L (ref 0–40)
Albumin/Globulin Ratio: 1.6 (ref 1.2–2.2)
Albumin: 4.4 g/dL (ref 3.8–4.8)
Alkaline Phosphatase: 100 IU/L (ref 44–121)
BUN/Creatinine Ratio: 25 (ref 12–28)
BUN: 21 mg/dL (ref 8–27)
Bilirubin Total: 0.3 mg/dL (ref 0.0–1.2)
CO2: 24 mmol/L (ref 20–29)
Calcium: 10.1 mg/dL (ref 8.7–10.3)
Chloride: 94 mmol/L — ABNORMAL LOW (ref 96–106)
Creatinine, Ser: 0.85 mg/dL (ref 0.57–1.00)
Globulin, Total: 2.7 g/dL (ref 1.5–4.5)
Glucose: 107 mg/dL — ABNORMAL HIGH (ref 70–99)
Potassium: 5.3 mmol/L — ABNORMAL HIGH (ref 3.5–5.2)
Sodium: 132 mmol/L — ABNORMAL LOW (ref 134–144)
Total Protein: 7.1 g/dL (ref 6.0–8.5)
eGFR: 74 mL/min/{1.73_m2} (ref >59)

## 2021-05-27 LAB — CBC WITH DIFFERENTIAL (CANCER CENTER ONLY)
Abs Immature Granulocytes: 0.21 10*3/uL — ABNORMAL HIGH (ref 0.00–0.07)
Basophils Absolute: 0 10*3/uL (ref 0.0–0.1)
Basophils Relative: 0 %
Eosinophils Absolute: 0.1 10*3/uL (ref 0.0–0.5)
Eosinophils Relative: 1 %
HCT: 32.3 % — ABNORMAL LOW (ref 36.0–46.0)
Hemoglobin: 10.4 g/dL — ABNORMAL LOW (ref 12.0–15.0)
Immature Granulocytes: 2 %
Lymphocytes Relative: 8 %
Lymphs Abs: 0.9 10*3/uL (ref 0.7–4.0)
MCH: 28.5 pg (ref 26.0–34.0)
MCHC: 32.2 g/dL (ref 30.0–36.0)
MCV: 88.5 fL (ref 80.0–100.0)
Monocytes Absolute: 0.5 10*3/uL (ref 0.1–1.0)
Monocytes Relative: 5 %
Neutro Abs: 9.2 10*3/uL — ABNORMAL HIGH (ref 1.7–7.7)
Neutrophils Relative %: 84 %
Platelet Count: 251 10*3/uL (ref 150–400)
RBC: 3.65 MIL/uL — ABNORMAL LOW (ref 3.87–5.11)
RDW: 15.8 % — ABNORMAL HIGH (ref 11.5–15.5)
WBC Count: 10.9 10*3/uL — ABNORMAL HIGH (ref 4.0–10.5)
nRBC: 0 % (ref 0.0–0.2)

## 2021-05-27 LAB — SEDIMENTATION RATE: Sed Rate: 63 mm/hr — ABNORMAL HIGH (ref 0–40)

## 2021-05-27 LAB — IRON AND IRON BINDING CAPACITY (CC-WL,HP ONLY)
Iron: 38 ug/dL (ref 28–170)
Saturation Ratios: 7 % — ABNORMAL LOW (ref 10.4–31.8)
TIBC: 536 ug/dL — ABNORMAL HIGH (ref 250–450)
UIBC: 498 ug/dL — ABNORMAL HIGH (ref 148–442)

## 2021-05-27 LAB — TROPONIN T: Troponin T (Highly Sensitive): 10 ng/L (ref 0–14)

## 2021-05-27 LAB — FERRITIN: Ferritin: 32 ng/mL (ref 11–307)

## 2021-05-27 LAB — C-REACTIVE PROTEIN: CRP: 16 mg/L — ABNORMAL HIGH (ref 0–10)

## 2021-05-27 NOTE — Progress Notes (Signed)
[facility] [phone]   [phone]  OFFICE PROGRESS NOTE  Tower, [name], MD [place] [address]  DIAGNOSIS:  1) Anemia secondary to GI occult blood loss.   2) suspicious for stage Ia (T1 a, N0, M0) lung cancer.  The patient declined bronchoscopy for tissue biopsy.  PRIOR THERAPY:  1) Oral iron supplements. Discontinued due to intolerance (severe abdominal pain) 2) status post SBRT to the lung lesion under the care of Dr. Donella Stade at [facility].  CURRENT THERAPY: IV iron infusions with venofer PRN. Last infusion on in December 2022.  INTERVAL HISTORY: Christine Barton 71 y.o. female returns to the clinic today for follow-up visit.  The patient is feeling fine today with no concerning complaints.  She felt much better after the iron infusion.  She denied having any current chest pain, shortness of breath, cough or hemoptysis.  She denied having any fever or chills.  She has no nausea, vomiting, diarrhea or constipation.  She has no headache or visual changes.  She is here today for evaluation with repeat CBC, iron study and ferritin.   MEDICAL HISTORY: Past Medical History:  Diagnosis Date   Alcohol abuse, unspecified    Breast cancer ([place]) 1998   Right   Cataract    left eye   Cervical spondylosis 2006   MRI   Degeneration of cervical intervertebral disc 2006   MRI   Diabetes mellitus without complication ([place])    Diverticulosis of colon (without mention of hemorrhage)    Hyperpotassemia    Lung cancer ([facility])    Microscopic hematuria    Mononeuritis of unspecified site    Nonspecific abnormal results of liver function study    Other abnormal glucose    Other and unspecified hyperlipidemia    no per pt   Other chronic nonalcoholic liver disease    Personal history of chemotherapy    Personal history of malignant neoplasm of breast    Personal history of radiation therapy    Pneumothorax, acute     right, spontaneous   Tobacco use disorder    Unspecified vitamin D deficiency     ALLERGIES:  is allergic to oxycodone, crestor [rosuvastatin], and glipizide.  MEDICATIONS:  Current Outpatient Medications  Medication Sig Dispense Refill   albuterol (VENTOLIN HFA) 108 (90 Base) MCG/ACT inhaler INHALE 2 PUFFS INTO THE LUNGS EVERY 4 HOURS AS NEEDED FOR WHEEZE OR FOR SHORTNESS OF BREATH 18 each 3   budesonide-formoterol (SYMBICORT) 160-4.5 MCG/ACT inhaler TAKE 2 PUFFS BY MOUTH TWICE A DAY 10.2 each 4   cholecalciferol (VITAMIN D3) 25 MCG (1000 UNIT) tablet Take 1,000 Units by mouth 2 (two) times daily.     clorazepate (TRANXENE) 3.75 MG tablet Take 1 tablet (3.75 mg total) by mouth 2 (two) times daily. 60 tablet 2   cyclobenzaprine (FLEXERIL) 10 MG tablet TAKE 1/2 TABLET BY MOUTH AT BEDTIME AS NEEDED FOR MUSCLE SPASMS 15 tablet 3   furosemide (LASIX) 20 MG tablet TAKE 1 TABLET BY MOUTH EVERY DAY 90 tablet 1   lactulose (CHRONULAC) 10 GM/15ML solution Take 15 mLs (10 g total) by mouth 3 (three) times daily. 236 mL 11   Lidocaine (HM LIDOCAINE PATCH) 4 % PTCH Apply to affected area as needed 90 patch 0   metFORMIN (GLUCOPHAGE-XR) 500 MG 24 hr tablet TAKE 1 TABLET BY MOUTH EVERY DAY WITH BREAKFAST 90 tablet 1   metoprolol tartrate (LOPRESSOR) 50 MG  tablet Take 2 hours before CT scan 1 tablet 0   Multiple Vitamin (MULTIVITAMIN) tablet Take 1 tablet by mouth daily.     Multiple Vitamins-Minerals (PRESERVISION AREDS 2) CAPS Take 1 capsule by mouth 2 (two) times daily.     mupirocin ointment (BACTROBAN) 2 % APPLY 1 APPLICATION TOPICALLY 2 (TWO) TIMES DAILY. TO AFFECTED AREAS 22 g 2   nadolol (CORGARD) 20 MG tablet TAKE 1 TABLET BY MOUTH EVERY DAY 90 tablet 3   nystatin (MYCOSTATIN) 100000 UNIT/ML suspension TAKE 5 MLS (500,000 UNITS TOTAL) BY MOUTH 3 (THREE) TIMES DAILY. SWISH AND SWALLOW 120 mL 0   pantoprazole (PROTONIX) 40 MG tablet TAKE 1 TABLET (40 MG TOTAL) BY MOUTH 2 (TWO) TIMES DAILY BEFORE  A MEAL. 180 tablet 1   spironolactone (ALDACTONE) 25 MG tablet TAKE 1 TABLET BY MOUTH EVERY DAY 30 tablet 1   SYSTANE COMPLETE 0.6 % SOLN Apply 1 drop to eye 2 (two) times daily.     temazepam (RESTORIL) 7.5 MG capsule TAKE 1 CAPSULE (7.5 MG TOTAL) BY MOUTH AT BEDTIME AS NEEDED FOR SLEEP. 30 capsule 3   Tiotropium Bromide Monohydrate (SPIRIVA RESPIMAT) 2.5 MCG/ACT AERS Inhale 2 puffs into the lungs daily. 4 g 0   traMADol (ULTRAM) 50 MG tablet Take 1 tablet (50 mg total) by mouth every 8 (eight) hours as needed for severe pain. 15 tablet 0   ursodiol (ACTIGALL) 300 MG capsule TAKE 1 CAPSULE BY MOUTH EVERY DAY 30 capsule 1   XIFAXAN 550 MG TABS tablet TAKE 1 TABLET BY MOUTH TWICE A DAY 60 tablet 3   No current facility-administered medications for this visit.    SURGICAL HISTORY:  Past Surgical History:  Procedure Laterality Date   BIOPSY  07/27/2019   Procedure: BIOPSY;  Surgeon: Yetta Flock, MD;  Location: [facility];  Service: Gastroenterology;;   BREAST BIOPSY  9/03   Right   BREAST LUMPECTOMY Right 1998   CHEST TUBE INSERTION  11/02/2014   COLONOSCOPY     COLONOSCOPY WITH PROPOFOL N/A 07/27/2019   Procedure: COLONOSCOPY WITH PROPOFOL;  Surgeon: Yetta Flock, MD;  Location: [facility];  Service: Gastroenterology;  Laterality: N/A;   ESOPHAGOGASTRODUODENOSCOPY (EGD) WITH PROPOFOL N/A 10/30/2018   Procedure: ESOPHAGOGASTRODUODENOSCOPY (EGD) WITH PROPOFOL;  Surgeon: Mauri Pole, MD;  Location: [facility];  Service: Endoscopy;  Laterality: N/A;   ESOPHAGOGASTRODUODENOSCOPY (EGD) WITH PROPOFOL N/A 03/12/2019   Procedure: ESOPHAGOGASTRODUODENOSCOPY (EGD) WITH PROPOFOL;  Surgeon: Mauri Pole, MD;  Location: [facility];  Service: Endoscopy;  Laterality: N/A;   ESOPHAGOGASTRODUODENOSCOPY (EGD) WITH PROPOFOL N/A 07/27/2019   Procedure: ESOPHAGOGASTRODUODENOSCOPY (EGD) WITH PROPOFOL;  Surgeon: Yetta Flock, MD;  Location: [facility];  Service:  Gastroenterology;  Laterality: N/A;   EYE SURGERY  02/2017   cataract extraction with lens implant-left   HOT HEMOSTASIS N/A 07/27/2019   Procedure: HOT HEMOSTASIS (ARGON PLASMA COAGULATION/BICAP);  Surgeon: Yetta Flock, MD;  Location: [facility];  Service: Gastroenterology;  Laterality: N/A;   MOUTH SURGERY     POLYPECTOMY  07/27/2019   Procedure: POLYPECTOMY;  Surgeon: Yetta Flock, MD;  Location: [facility];  Service: Gastroenterology;;   TUBAL LIGATION      REVIEW OF SYSTEMS:  A comprehensive review of systems was negative.   PHYSICAL EXAMINATION: General appearance: alert, cooperative, and no distress Head: Normocephalic, without obvious abnormality, atraumatic Neck: no adenopathy, no JVD, supple, symmetrical, trachea midline, and thyroid not enlarged, symmetric, no tenderness/mass/nodules Lymph nodes: Cervical, supraclavicular, and axillary nodes normal. Resp:  clear to auscultation bilaterally Back: symmetric, no curvature. ROM normal. No CVA tenderness. Cardio: regular rate and rhythm, S1, S2 normal, no murmur, click, rub or gallop GI: soft, non-tender; bowel sounds normal; no masses,  no organomegaly Extremities: extremities normal, atraumatic, no cyanosis or edema  ECOG PERFORMANCE STATUS: 1 - Symptomatic but completely ambulatory  Blood pressure 123/64, pulse 83, temperature (!) 97.1 F (36.2 C), temperature source Tympanic, resp. rate 17, height 5' 4"  (1.626 m), weight 114 lb 4.8 oz (51.8 kg), SpO2 100 %.  LABORATORY DATA: Lab Results  Component Value Date   WBC 5.7 02/25/2021   HGB 11.1 (L) 02/25/2021   HCT 35.4 (L) 02/25/2021   MCV 91.5 02/25/2021   PLT 145 (L) 02/25/2021      Chemistry      Component Value Date/Time   NA 132 (L) 05/26/2021 1147   K 5.3 (H) 05/26/2021 1147   CL 94 (L) 05/26/2021 1147   CO2 24 05/26/2021 1147   BUN 21 05/26/2021 1147   CREATININE 0.85 05/26/2021 1147      Component Value Date/Time   CALCIUM 10.1  05/26/2021 1147   ALKPHOS 100 05/26/2021 1147   AST 20 05/26/2021 1147   ALT 8 05/26/2021 1147   BILITOT 0.3 05/26/2021 1147       RADIOGRAPHIC STUDIES: No results found.  ASSESSMENT AND PLAN: This is a very pleasant 71 years old white female with iron deficiency anemia secondary to likely chronic blood loss secondary to AV malformation as well as lack of dietary iron supplements.  She has intolerance to the oral iron tablets. The patient has been treated with IV iron infusion with Venofer 300 mg IV weekly for 3 weeks and tolerated it fairly well. She is feeling much better today.  CBC showed persistent mild anemia with hemoglobin of 10.4.  Iron study and ferritin are still pending. I recommended for the patient to continue on observation with repeat CBC, iron study and ferritin in 3 months but if there is significant iron deficiency, I will arrange for the patient to receive additional iron infusion in the interval. Regarding the suspicious stage Ia lung cancer, the patient underwent SBRT under the care of Dr. Donella Stade at [facility] [place] [facility]. She was advised to call immediately if she has any other concerning symptoms in the interval.  The patient voices understanding of current disease status and treatment options and is in agreement with the current care plan. All questions were answered. The patient knows to call the clinic with any problems, questions or concerns. We can certainly see the patient much sooner if necessary.   Disclaimer: This note was dictated with voice recognition software. Similar sounding words can inadvertently be transcribed and may not be corrected upon review.

## 2021-05-28 ENCOUNTER — Telehealth: Payer: Self-pay

## 2021-05-28 NOTE — Telephone Encounter (Signed)
I spoke with pt and advised as indicated. Pt expressed understanding of this information and requests to receive her infusion at [facility] because it is closer to her home.Marland Kitchen

## 2021-05-28 NOTE — Telephone Encounter (Signed)
-----   Message from Curt Bears, MD sent at 05/27/2021  1:21 PM EST ----- Please let the patient knows that her iron level is still low and I did order iron infusion at the [place] infusion center to start next week for 3 doses.  Thank you. ----- Message ----- From: Buel Ream, [facility] [place] Sent: 05/27/2021  11:04 AM EST To: Curt Bears, MD

## 2021-05-29 ENCOUNTER — Telehealth: Payer: Self-pay | Admitting: Family Medicine

## 2021-05-29 ENCOUNTER — Telehealth: Payer: Self-pay | Admitting: Cardiology

## 2021-05-29 ENCOUNTER — Other Ambulatory Visit: Payer: Self-pay

## 2021-05-29 ENCOUNTER — Ambulatory Visit: Payer: Medicare HMO | Admitting: Family Medicine

## 2021-05-29 DIAGNOSIS — R072 Precordial pain: Secondary | ICD-10-CM

## 2021-05-29 NOTE — Telephone Encounter (Signed)
Error

## 2021-05-29 NOTE — Telephone Encounter (Signed)
Please see results- sent to Dr.Schumann to advise further.

## 2021-05-29 NOTE — Telephone Encounter (Signed)
Spoke with patient, she reports primarily having shoulder pain.  Recommend f/u with PCP but if worsening pain then would go to urgent care

## 2021-05-29 NOTE — Telephone Encounter (Signed)
Did you want to make any changes to her taking spironolactone? (Patient states she is taking 25 mg every other day) I sent you over that results note as well.   Thank you!

## 2021-05-29 NOTE — Progress Notes (Signed)
Let's hold spironolactone for now, repeat BMET in 1 week

## 2021-05-29 NOTE — Telephone Encounter (Signed)
Spoke with patient who reports that Dr. Gardiner Rhyme was going to order something for inflammation. She reports having "severe pain" in her shoulder/clavicle area rating it as an 8/10. She took 2 tylenol without relief, but heating pad helped a bit last night. I recommended that she call her PCP for medication for pain and inflammation. She canceled her PCP appointment today because she was exposed to her son-in-law who has [place]. Please advise on pain/inflammation.

## 2021-05-29 NOTE — Telephone Encounter (Signed)
Pt is reaching out stating that she was supposed to be prescribed a medication for pain due to inflammation and she hasn't heard back from Dr. Gardiner Rhyme or the pharmacy... please advise

## 2021-06-01 NOTE — Telephone Encounter (Signed)
Lets drop dose to 12.5 mg every other day

## 2021-06-02 ENCOUNTER — Other Ambulatory Visit: Payer: Self-pay

## 2021-06-02 ENCOUNTER — Ambulatory Visit (HOSPITAL_COMMUNITY): Payer: Medicare HMO | Attending: Cardiovascular Disease

## 2021-06-02 DIAGNOSIS — R072 Precordial pain: Secondary | ICD-10-CM | POA: Diagnosis not present

## 2021-06-02 LAB — ECHOCARDIOGRAM COMPLETE
Area-P 1/2: 3.72 cm2
S' Lateral: 2.9 cm

## 2021-06-02 MED ORDER — SPIRONOLACTONE 25 MG PO TABS: 12.5000 mg | ORAL_TABLET | ORAL | 2 refills | Status: DC

## 2021-06-02 NOTE — Telephone Encounter (Signed)
Returned call to patient, advised patient of Dr. Newman Nickels recommendations. New prescription for spironolactone 12.46m every other day sent to patient preferred pharmacy. Patient will return in 1 week for repeat labs. Order is in.   Advised patient to call back to office with any issues, questions, or concerns. Patient verbalized understanding.

## 2021-06-03 ENCOUNTER — Encounter: Payer: Self-pay | Admitting: Cardiology

## 2021-06-04 ENCOUNTER — Ambulatory Visit (INDEPENDENT_AMBULATORY_CARE_PROVIDER_SITE_OTHER)
Admission: RE | Admit: 2021-06-04 | Discharge: 2021-06-04 | Disposition: A | Discharge status: Home / Self Care | Payer: Medicare HMO | Source: Ambulatory Visit | Attending: Family Medicine | Admitting: Family Medicine

## 2021-06-04 ENCOUNTER — Telehealth: Payer: Self-pay

## 2021-06-04 ENCOUNTER — Encounter: Payer: Self-pay | Admitting: Family Medicine

## 2021-06-04 ENCOUNTER — Ambulatory Visit (INDEPENDENT_AMBULATORY_CARE_PROVIDER_SITE_OTHER): Payer: Medicare HMO | Admitting: Family Medicine

## 2021-06-04 ENCOUNTER — Other Ambulatory Visit: Payer: Self-pay

## 2021-06-04 VITALS — BP 104/60 | HR 68 | Temp 96.9°F | Ht 64.0 in | Wt 115.0 lb

## 2021-06-04 DIAGNOSIS — K7030 Alcoholic cirrhosis of liver without ascites: Secondary | ICD-10-CM

## 2021-06-04 DIAGNOSIS — E1169 Type 2 diabetes mellitus with other specified complication: Secondary | ICD-10-CM

## 2021-06-04 DIAGNOSIS — Z7984 Long term (current) use of oral hypoglycemic drugs: Secondary | ICD-10-CM | POA: Diagnosis not present

## 2021-06-04 DIAGNOSIS — E785 Hyperlipidemia, unspecified: Secondary | ICD-10-CM

## 2021-06-04 DIAGNOSIS — J449 Chronic obstructive pulmonary disease, unspecified: Secondary | ICD-10-CM | POA: Diagnosis not present

## 2021-06-04 DIAGNOSIS — R911 Solitary pulmonary nodule: Secondary | ICD-10-CM | POA: Diagnosis not present

## 2021-06-04 DIAGNOSIS — E118 Type 2 diabetes mellitus with unspecified complications: Secondary | ICD-10-CM | POA: Diagnosis not present

## 2021-06-04 DIAGNOSIS — C3411 Malignant neoplasm of upper lobe, right bronchus or lung: Secondary | ICD-10-CM

## 2021-06-04 DIAGNOSIS — R0789 Other chest pain: Secondary | ICD-10-CM | POA: Diagnosis not present

## 2021-06-04 DIAGNOSIS — R079 Chest pain, unspecified: Secondary | ICD-10-CM | POA: Diagnosis not present

## 2021-06-04 LAB — POCT GLYCOSYLATED HEMOGLOBIN (HGB A1C): Hemoglobin A1C: 5.7 % — AB (ref 4.0–5.6)

## 2021-06-04 MED ORDER — PREDNISONE 10 MG PO TABS: ORAL_TABLET | ORAL | 0 refills | Status: DC

## 2021-06-04 NOTE — Progress Notes (Signed)
Subjective:    Patient ID: Christine Barton, female    DOB: 1950-11-16, 71 y.o.   MRN: [id number]  This visit occurred during the SARS-CoV-2 public health emergency.  Safety protocols were in place, including screening questions prior to the visit, additional usage of staff PPE, and extensive cleaning of exam room while observing appropriate contact time as indicated for disinfecting solutions.   HPI Pt presents for f/u of DM2 and hyperlipidemia and chronic health problems  Wt Readings from Last 3 Encounters:  06/04/21 115 lb (52.2 kg)  05/27/21 114 lb 4.8 oz (51.8 kg)  05/26/21 114 lb 3.2 oz (51.8 kg)   19.74 kg/m   Suspicious stage 1a lung cancer  Smoking status - down to 1/2 ppd   Was seen by cardiology for chest pain  (left anterior radiating up to the shoulder) Dx with chest wall pain  Had a reassuring 2d echo Also reassuring myo view stress in 02/2021   Lab Results  Component Value Date   ESRSEDRATE 63 (H) 05/26/2021  Pt c/o inflammation    Has has PT for pain under L breast in the past That was put on hold for her lung ca radiation treatment  Overdid it when trying to return to exercise and the pain started     Pain has eased up some (less intense)  Used an over the shoulder heating pad  Pain is occ sharp but mostly dull  From under breast to trap area Hurts to take deep breath  Moving arms worsens it  Better if she lies down with heat     Used to take temazepam for sleep -needs a refill     Lab Results  Component Value Date   CREATININE 0.85 05/26/2021   BUN 21 05/26/2021   NA 132 (L) 05/26/2021   K 5.3 (H) 05/26/2021   CL 94 (L) 05/26/2021   CO2 24 05/26/2021     DM2 Lab Results  Component Value Date   HGBA1C 6.4 11/07/2020  No complaints Has never been a good eater but now with recent pain ate less  Has 2-3 ensure and was gaining weight  Overall doing better   Intolerant of statin    Last eye exam was 2 y ago    Lab Results   Component Value Date   CHOL 142 11/07/2020   HDL 34.80 (L) 11/07/2020   LDLCALC 82 11/07/2020   LDLDIRECT 83.0 10/31/2017   TRIG 124.0 11/07/2020   CHOLHDL 4 11/07/2020   Cirrhosis Lab Results  Component Value Date   ALT 8 05/26/2021   AST 20 05/26/2021   ALKPHOS 100 05/26/2021   BILITOT 0.3 05/26/2021   Patient Active Problem List   Diagnosis Date Noted   Left-sided chest wall pain 06/04/2021   Lung cancer ([place]) 02/25/2021   Iron deficiency anemia 01/01/2021   Iron deficiency anemia due to chronic blood loss 11/06/2020   AVM (arteriovenous malformation) of colon    Benign neoplasm of colon    NASH (nonalcoholic steatohepatitis)    Malnutrition ([place]) 07/25/2019   Symptomatic anemia 07/25/2019   Tobacco abuse 07/25/2019   Mass of soft tissue of face 03/06/2019   HSV infection 02/23/2019   Aortic atherosclerosis ([place]) 01/22/2019   Coronary atherosclerosis 01/22/2019   Encounter for screening for lung cancer 01/04/2019   Venous insufficiency 11/15/2018   Esophageal varices without bleeding ([place]) 10/27/2018   Anemia 10/23/2018   Fatigue 10/23/2018   Poor balance 05/30/2018   Generalized weakness 05/30/2018  Cirrhosis of liver ([place]) 04/24/2018   Ascites 04/24/2018   Gallstones 04/24/2018   Heme positive stool 04/24/2018   Screening mammogram, encounter for 11/07/2017   Smoker 11/07/2017   Screening examination for STD (sexually transmitted disease) 11/11/2016   Welcome to Medicare preventive visit 10/26/2016   Estrogen deficiency 10/26/2016   Colon cancer screening 11/01/2014   Encounter for routine gynecological examination 09/12/2013   Rapid heart beat 10/10/2012   Routine general medical examination at a health care facility 07/21/2011   Vitamin D deficiency 08/04/2009   POSTMENOPAUSAL STATUS 08/04/2009   Controlled diabetes mellitus type 2 with complications ([place]) 91/63/8466   Hyperlipidemia associated with type 2 diabetes mellitus ([place]) 06/14/2008   [place]  DISEASE, CERVICAL 03/30/2007   History of alcohol abuse 12/06/2006   Neuropathy of both feet 12/06/2006   DIVERTICULOSIS, COLON 12/06/2006   Fatty liver 12/06/2006   BREAST CANCER, HX OF 12/06/2006   Past Medical History:  Diagnosis Date   Alcohol abuse, unspecified    Breast cancer ([place]) 1998   Right   Cataract    left eye   Cervical spondylosis 2006   MRI   Degeneration of cervical intervertebral disc 2006   MRI   Diabetes mellitus without complication ([place])    Diverticulosis of colon (without mention of hemorrhage)    Hyperpotassemia    Lung cancer ([place])    Microscopic hematuria    Mononeuritis of unspecified site    Nonspecific abnormal results of liver function study    Other abnormal glucose    Other and unspecified hyperlipidemia    no per pt   Other chronic nonalcoholic liver disease    Personal history of chemotherapy    Personal history of malignant neoplasm of breast    Personal history of radiation therapy    Pneumothorax, acute    right, spontaneous   Tobacco use disorder    Unspecified vitamin D deficiency    Past Surgical History:  Procedure Laterality Date   BIOPSY  07/27/2019   Procedure: BIOPSY;  Surgeon: Yetta Flock, MD;  Location: [facility];  Service: Gastroenterology;;   BREAST BIOPSY  9/03   Right   BREAST LUMPECTOMY Right 1998   CHEST TUBE INSERTION  11/02/2014   COLONOSCOPY     COLONOSCOPY WITH PROPOFOL N/A 07/27/2019   Procedure: COLONOSCOPY WITH PROPOFOL;  Surgeon: Yetta Flock, MD;  Location: [facility];  Service: Gastroenterology;  Laterality: N/A;   ESOPHAGOGASTRODUODENOSCOPY (EGD) WITH PROPOFOL N/A 10/30/2018   Procedure: ESOPHAGOGASTRODUODENOSCOPY (EGD) WITH PROPOFOL;  Surgeon: Mauri Pole, MD;  Location: [facility];  Service: Endoscopy;  Laterality: N/A;   ESOPHAGOGASTRODUODENOSCOPY (EGD) WITH PROPOFOL N/A 03/12/2019   Procedure: ESOPHAGOGASTRODUODENOSCOPY (EGD) WITH PROPOFOL;  Surgeon: Mauri Pole,  MD;  Location: [facility];  Service: Endoscopy;  Laterality: N/A;   ESOPHAGOGASTRODUODENOSCOPY (EGD) WITH PROPOFOL N/A 07/27/2019   Procedure: ESOPHAGOGASTRODUODENOSCOPY (EGD) WITH PROPOFOL;  Surgeon: Yetta Flock, MD;  Location: [facility];  Service: Gastroenterology;  Laterality: N/A;   EYE SURGERY  02/2017   cataract extraction with lens implant-left   HOT HEMOSTASIS N/A 07/27/2019   Procedure: HOT HEMOSTASIS (ARGON PLASMA COAGULATION/BICAP);  Surgeon: Yetta Flock, MD;  Location: [facility];  Service: Gastroenterology;  Laterality: N/A;   MOUTH SURGERY     POLYPECTOMY  07/27/2019   Procedure: POLYPECTOMY;  Surgeon: Yetta Flock, MD;  Location: [facility];  Service: Gastroenterology;;   TUBAL LIGATION     Social History   Tobacco Use   Smoking status: Every  Day    Packs/day: 1.50    Years: 58.00    Pack years: 87.00    Types: Cigarettes    Passive exposure: Past   Smokeless tobacco: Never   Tobacco comments:    0.5 ppd - 04/14/21  Vaping Use   Vaping Use: Never used  Substance Use Topics   Alcohol use: Not Currently   Drug use: No   Family History  Problem Relation Age of Onset   Heart failure Father    Heart attack Father    Colon cancer Maternal Uncle    Stroke Mother    Esophageal cancer Neg Hx    Rectal cancer Neg Hx    Stomach cancer Neg Hx    Pancreatic cancer Neg Hx    Allergies  Allergen Reactions   Oxycodone Nausea And Vomiting   Crestor [Rosuvastatin]     Abdominal pain     Glipizide Other (See Comments)    Stomach pain   Current Outpatient Medications on File Prior to Visit  Medication Sig Dispense Refill   albuterol (VENTOLIN HFA) 108 (90 Base) MCG/ACT inhaler INHALE 2 PUFFS INTO THE LUNGS EVERY 4 HOURS AS NEEDED FOR WHEEZE OR FOR SHORTNESS OF BREATH 18 each 3   budesonide-formoterol (SYMBICORT) 160-4.5 MCG/ACT inhaler TAKE 2 PUFFS BY MOUTH TWICE A DAY 10.2 each 4   cholecalciferol (VITAMIN D3) 25 MCG (1000 UNIT) tablet  Take 1,000 Units by mouth 2 (two) times daily.     cyclobenzaprine (FLEXERIL) 10 MG tablet TAKE 1/2 TABLET BY MOUTH AT BEDTIME AS NEEDED FOR MUSCLE SPASMS 15 tablet 3   furosemide (LASIX) 20 MG tablet TAKE 1 TABLET BY MOUTH EVERY DAY 90 tablet 1   lactulose (CHRONULAC) 10 GM/15ML solution Take 15 mLs (10 g total) by mouth 3 (three) times daily. 236 mL 11   metFORMIN (GLUCOPHAGE-XR) 500 MG 24 hr tablet TAKE 1 TABLET BY MOUTH EVERY DAY WITH BREAKFAST 90 tablet 1   Multiple Vitamin (MULTIVITAMIN) tablet Take 1 tablet by mouth daily.     Multiple Vitamins-Minerals (PRESERVISION AREDS 2) CAPS Take 1 capsule by mouth 2 (two) times daily.     mupirocin ointment (BACTROBAN) 2 % APPLY 1 APPLICATION TOPICALLY 2 (TWO) TIMES DAILY. TO AFFECTED AREAS 22 g 2   nadolol (CORGARD) 20 MG tablet TAKE 1 TABLET BY MOUTH EVERY DAY 90 tablet 3   nystatin (MYCOSTATIN) 100000 UNIT/ML suspension TAKE 5 MLS (500,000 UNITS TOTAL) BY MOUTH 3 (THREE) TIMES DAILY. SWISH AND SWALLOW 120 mL 0   pantoprazole (PROTONIX) 40 MG tablet TAKE 1 TABLET (40 MG TOTAL) BY MOUTH 2 (TWO) TIMES DAILY BEFORE A MEAL. 180 tablet 1   spironolactone (ALDACTONE) 25 MG tablet Take 0.5 tablets (12.5 mg total) by mouth every other day. 30 tablet 2   SYSTANE COMPLETE 0.6 % SOLN Apply 1 drop to eye 2 (two) times daily.     Tiotropium Bromide Monohydrate (SPIRIVA RESPIMAT) 2.5 MCG/ACT AERS Inhale 2 puffs into the lungs daily. 4 g 0   ursodiol (ACTIGALL) 300 MG capsule TAKE 1 CAPSULE BY MOUTH EVERY DAY 30 capsule 1   XIFAXAN 550 MG TABS tablet TAKE 1 TABLET BY MOUTH TWICE A DAY 60 tablet 3   metoprolol tartrate (LOPRESSOR) 50 MG tablet Take 2 hours before CT scan (Patient not taking: Reported on 06/04/2021) 1 tablet 0   traMADol (ULTRAM) 50 MG tablet Take 1 tablet (50 mg total) by mouth every 8 (eight) hours as needed for severe pain. (Patient not taking: Reported on  06/04/2021) 15 tablet 0   No current facility-administered medications on file prior to  visit.      Review of Systems  Constitutional:  Positive for fatigue. Negative for activity change, appetite change, fever and unexpected weight change.  HENT:  Negative for congestion, ear pain, rhinorrhea, sinus pressure and sore throat.   Eyes:  Negative for pain, redness and visual disturbance.  Respiratory:  Negative for cough, shortness of breath and wheezing.   Cardiovascular:  Negative for chest pain and palpitations.  Gastrointestinal:  Negative for abdominal pain, blood in stool, constipation and diarrhea.  Endocrine: Negative for polydipsia and polyuria.  Genitourinary:  Negative for dysuria, frequency and urgency.  Musculoskeletal:  Negative for arthralgias, back pain and myalgias.       Chest wall pain   Skin:  Negative for pallor and rash.  Allergic/Immunologic: Negative for environmental allergies.  Neurological:  Negative for dizziness, syncope and headaches.  Hematological:  Negative for adenopathy. Does not bruise/bleed easily.  Psychiatric/Behavioral:  Negative for decreased concentration and dysphoric mood. The patient is not nervous/anxious.       Objective:   Physical Exam Constitutional:      General: She is not in acute distress.    Appearance: Normal appearance. She is well-developed and normal weight. She is not ill-appearing.  HENT:     Head: Normocephalic and atraumatic.  Eyes:     Conjunctiva/sclera: Conjunctivae normal.     Pupils: Pupils are equal, round, and reactive to light.  Neck:     Thyroid: No thyromegaly.     Vascular: No carotid bruit or JVD.  Cardiovascular:     Rate and Rhythm: Normal rate and regular rhythm.     Heart sounds: Normal heart sounds.    No gallop.  Pulmonary:     Effort: Pulmonary effort is normal. No respiratory distress.     Breath sounds: Normal breath sounds. No wheezing or rales.     Comments: Tender in L anterolateral chest wall   No step off or crepitus   Baseline distant bs with fair air exchange Abdominal:      General: Bowel sounds are normal. There is no distension or abdominal bruit.     Palpations: Abdomen is soft. There is no mass.     Tenderness: There is no abdominal tenderness.  Musculoskeletal:     Cervical back: Normal range of motion and neck supple.     Right lower leg: No edema.     Left lower leg: No edema.  Lymphadenopathy:     Cervical: No cervical adenopathy.  Skin:    General: Skin is warm and dry.     Coloration: Skin is not jaundiced or pale.     Findings: No erythema or rash.  Neurological:     Mental Status: She is alert.     Coordination: Coordination normal.     Deep Tendon Reflexes: Reflexes are normal and symmetric. Reflexes normal.  Psychiatric:        Mood and Affect: Mood normal.          Assessment & Plan:   Problem List Items Addressed This Visit       Endocrine   Controlled diabetes mellitus type 2 with complications ([place])    Lab Results  Component Value Date   HGBA1C 5.7 (A) 06/04/2021  Very good control with metformin xr 500 mg daily  Enc her to eat a diet with more protein calories  Doing some protein shakes  Due for eye exam,  pt wants to schedule her own      Relevant Orders   POCT glycosylated hemoglobin (Hb A1C) (Completed)   Hyperlipidemia associated with type 2 diabetes mellitus ([place])    Disc goals for lipids and reasons to control them Rev last labs with pt Rev low sat fat diet in detail Cannot have statin due to cirrhosis         Other   Left-sided chest wall pain - Primary    With tenderness on exam  Suspect musculoskeletal  Improved from when it started  cxr and rib films ordered  Watch for cough or sob Px prednisone for pain /inflammation  Urged to use heat prn  Update if not starting to improve in a week or if worsening        Relevant Orders   DG Chest 2 View   DG Ribs Unilateral Left

## 2021-06-04 NOTE — Patient Instructions (Addendum)
You need an eye exam (annual diabetic exam) -please schedule it   Take prednisone for chest wall pain  I sent [facility] to [facility] It is a steroid You may feel hyper and hungry and your blood sugar will go up temporarily   Xray today of chest/ribs   Continue heat for 10 minutes at a time  Make sure to take big deep breaths to prevent pneumonia   A1c today

## 2021-06-04 NOTE — Telephone Encounter (Signed)
Pt forgot to ask about exercises that are safe for her and when she can go to the gym. Dr Glori Bickers advised probably wait a week and work her way up slowly. If chest wall pain comes on, take her time.

## 2021-06-05 ENCOUNTER — Telehealth (HOSPITAL_COMMUNITY): Payer: Self-pay | Admitting: *Deleted

## 2021-06-05 NOTE — Telephone Encounter (Signed)
Reaching out to patient to offer assistance regarding upcoming cardiac imaging study; pt verbalizes understanding of appt date/time, parking situation and where to check in, pre-test NPO status and medications ordered, and verified current allergies; name and call back number provided for further questions should they arise  Christine Clement RN Navigator [facility] [phone] office [phone] cell  Patient to take 20m metoprolol tartrate two hours prior to her cardiac CT scan. She is aware to arrive at 12:30pm for her 1pm scan.

## 2021-06-06 NOTE — Assessment & Plan Note (Signed)
Disc goals for lipids and reasons to control them Rev last labs with pt Rev low sat fat diet in detail Cannot have statin due to cirrhosis

## 2021-06-06 NOTE — Assessment & Plan Note (Signed)
With tenderness on exam  Suspect musculoskeletal  Improved from when it started  cxr and rib films ordered  Watch for cough or sob Px prednisone for pain /inflammation  Urged to use heat prn  Update if not starting to improve in a week or if worsening

## 2021-06-06 NOTE — Assessment & Plan Note (Signed)
Lab Results  Component Value Date   HGBA1C 5.7 (A) 06/04/2021   Very good control with metformin xr 500 mg daily  Enc her to eat a diet with more protein calories  Doing some protein shakes  Due for eye exam, pt wants to schedule her own

## 2021-06-08 ENCOUNTER — Encounter (HOSPITAL_COMMUNITY): Payer: Self-pay

## 2021-06-08 ENCOUNTER — Other Ambulatory Visit: Payer: Self-pay | Admitting: Internal Medicine

## 2021-06-08 ENCOUNTER — Telehealth: Payer: Self-pay | Admitting: *Deleted

## 2021-06-08 ENCOUNTER — Ambulatory Visit (HOSPITAL_COMMUNITY)
Admission: RE | Admit: 2021-06-08 | Discharge: 2021-06-08 | Disposition: A | Discharge status: Home / Self Care | Payer: Medicare HMO | Source: Ambulatory Visit | Attending: Cardiology | Admitting: Cardiology

## 2021-06-08 ENCOUNTER — Other Ambulatory Visit: Payer: Self-pay

## 2021-06-08 DIAGNOSIS — I259 Chronic ischemic heart disease, unspecified: Secondary | ICD-10-CM | POA: Insufficient documentation

## 2021-06-08 DIAGNOSIS — I2510 Atherosclerotic heart disease of native coronary artery without angina pectoris: Secondary | ICD-10-CM | POA: Insufficient documentation

## 2021-06-08 DIAGNOSIS — R072 Precordial pain: Secondary | ICD-10-CM | POA: Diagnosis not present

## 2021-06-08 DIAGNOSIS — R931 Abnormal findings on diagnostic imaging of heart and coronary circulation: Secondary | ICD-10-CM | POA: Diagnosis not present

## 2021-06-08 MED ORDER — IOHEXOL 350 MG/ML SOLN
95.0000 mL | Freq: Once | INTRAVENOUS | Status: AC | PRN
  Administered 2021-06-08: 95 mL via INTRAVENOUS

## 2021-06-08 MED ORDER — NITROGLYCERIN 0.4 MG SL SUBL
0.8000 mg | SUBLINGUAL_TABLET | Freq: Once | SUBLINGUAL | Status: AC
  Administered 2021-06-08: 0.8 mg via SUBLINGUAL

## 2021-06-08 MED ORDER — NITROGLYCERIN 0.4 MG SL SUBL
SUBLINGUAL_TABLET | SUBLINGUAL | Status: AC
  Filled 2021-06-08: qty 2

## 2021-06-08 MED ORDER — METOPROLOL TARTRATE 5 MG/5ML IV SOLN: 10.0000 mg | Freq: Two times a day (BID) | INTRAVENOUS | Status: DC | PRN

## 2021-06-08 MED ORDER — METOPROLOL TARTRATE 5 MG/5ML IV SOLN
INTRAVENOUS | Status: AC
  Administered 2021-06-08: 5 mg via INTRAVENOUS
  Filled 2021-06-08: qty 10

## 2021-06-08 NOTE — Telephone Encounter (Signed)
Patient called asking if Dr Baruch Gouty will "open up my right arm I had breast cancer in 25 years ago and had some lymph nodes removed and found out it can be used again after 20 years, it sure would help with all the sticks I am getting lately" Please advise if she can now use her right arm for lab draws

## 2021-06-09 ENCOUNTER — Encounter: Payer: Self-pay | Admitting: Physician Assistant

## 2021-06-09 ENCOUNTER — Encounter (HOSPITAL_COMMUNITY): Payer: Self-pay | Admitting: Internal Medicine

## 2021-06-09 ENCOUNTER — Telehealth: Payer: Self-pay | Admitting: *Deleted

## 2021-06-09 ENCOUNTER — Ambulatory Visit (HOSPITAL_COMMUNITY)
Admission: RE | Admit: 2021-06-09 | Discharge: 2021-06-09 | Disposition: A | Discharge status: Home / Self Care | Payer: Medicare HMO | Source: Ambulatory Visit | Attending: Internal Medicine | Admitting: Internal Medicine

## 2021-06-09 DIAGNOSIS — I259 Chronic ischemic heart disease, unspecified: Secondary | ICD-10-CM

## 2021-06-09 DIAGNOSIS — R072 Precordial pain: Secondary | ICD-10-CM | POA: Diagnosis not present

## 2021-06-09 DIAGNOSIS — I2510 Atherosclerotic heart disease of native coronary artery without angina pectoris: Secondary | ICD-10-CM | POA: Diagnosis not present

## 2021-06-09 DIAGNOSIS — R931 Abnormal findings on diagnostic imaging of heart and coronary circulation: Secondary | ICD-10-CM | POA: Diagnosis not present

## 2021-06-09 NOTE — Telephone Encounter (Signed)
Returned call to patient and told her that per Dr. Baruch Gouty she may use her right arm again since it has been 25 years since her surgery.   She was extremely happy to hear this news.

## 2021-06-09 NOTE — Telephone Encounter (Signed)
-----   Message from Donato Heinz, MD sent at 06/09/2021  3:47 PM EST ----- Plaque in the heart arteries (calcium score 598), moderate stenosis on CT.  CT FFR shows no significant blockages.  Stopped taking rosuvastatin, would restart rosuvastatin 5 mg daily

## 2021-06-09 NOTE — Telephone Encounter (Signed)
pt aware of results, she refuses to take crestor again because of abdominal pain. She is not sure of other medications she has tried. She will not take a statin. She is open to other medications that are not statins to try to get those number down. Aware will make dr Gardiner Rhyme aware.

## 2021-06-10 NOTE — Telephone Encounter (Signed)
Recommend referral to pharmacy lipid clinic ?

## 2021-06-11 NOTE — Telephone Encounter (Signed)
Spoke with pt, Follow up scheduled with the pharm md. ?

## 2021-06-15 ENCOUNTER — Ambulatory Visit (INDEPENDENT_AMBULATORY_CARE_PROVIDER_SITE_OTHER): Payer: Medicare HMO | Admitting: Family Medicine

## 2021-06-15 ENCOUNTER — Other Ambulatory Visit: Payer: Self-pay

## 2021-06-15 ENCOUNTER — Encounter: Payer: Self-pay | Admitting: Family Medicine

## 2021-06-15 VITALS — BP 108/68 | HR 60 | Temp 97.3°F | Ht 64.0 in | Wt 114.0 lb

## 2021-06-15 DIAGNOSIS — D500 Iron deficiency anemia secondary to blood loss (chronic): Secondary | ICD-10-CM | POA: Diagnosis not present

## 2021-06-15 DIAGNOSIS — R0789 Other chest pain: Secondary | ICD-10-CM | POA: Diagnosis not present

## 2021-06-15 MED ORDER — TRAMADOL HCL 50 MG PO TABS: 50.0000 mg | ORAL_TABLET | Freq: Three times a day (TID) | ORAL | 0 refills | Status: DC | PRN

## 2021-06-15 NOTE — Patient Instructions (Addendum)
When the pain hits the ribs and/or the shoulder (trapezius) area try the lidoderm patch in the affected area  ? ?You can use a tramadol for more severe pain (caution of sedation and habit)  ? ?You need to watch some tylenol intake  ? ?Avoid anti inflammatories - they increase risk of GI bleeding  ? ?Get started with the physical therapy  ? ? ? ? ?

## 2021-06-15 NOTE — Progress Notes (Signed)
Subjective:    Patient ID: Christine Barton, female    DOB: 04-May-1950, 71 y.o.   MRN: [id number]  This visit occurred during the SARS-CoV-2 public health emergency.  Safety protocols were in place, including screening questions prior to the visit, additional usage of staff PPE, and extensive cleaning of exam room while observing appropriate contact time as indicated for disinfecting solutions.   HPI Pt presents for f/u of L sided chest wall pain   Wt Readings from Last 3 Encounters:  06/15/21 114 lb (51.7 kg)  06/04/21 115 lb (52.2 kg)  05/27/21 114 lb 4.8 oz (51.8 kg)   19.57 kg/m    Last visit was px prednisone for chest wall pain   Helped the chest discomfort but she did not tolerate medication  GI upset Felt terrible all over  Just did not feel well  After she stopped it the pain came right back   Saw Dr Georgina Snell   Going to physical therapy  Now the shoulder is bothering her more Saw Dr Georgina Snell  Pulse ox 100% today  BP Readings from Last 3 Encounters:  06/15/21 108/68  06/08/21 (!) 121/57  06/04/21 104/60   Pulse Readings from Last 3 Encounters:  06/15/21 60  06/08/21 72  06/04/21 68     Imaging done DG Chest 2 View (Accession [id number]) (Order [id number]) Imaging Date: 06/04/2021 Department: [place] [facility] Ordering/Authorizing: Maripaz Mullan, [name], MD   Exam Status  Status  Final [99]   PACS Intelerad Image Link   Show images for DG Chest 2 View  Study Result  Narrative & Impression  CLINICAL DATA:  Pain in left lateral chest wall, under left breast. COPD. Low bone mass. History of lung cancer. History of breast cancer.   EXAM: CHEST - 2 VIEW; LEFT RIBS - 2 VIEW   COMPARISON:  Chest CT 01/26/2021   FINDINGS: Chest: Right apical opacity, corresponds to presumed postradiation change, and is similar in appearance to prior CT. There is also left apical pleuroparenchymal scarring. There is mild diffuse bronchial thickening.  Previous right lower lobe pulmonary nodule is not definitively seen, a rounded density projecting over the right lung base is felt to represent a nipple shadow. There is no pleural effusion, pulmonary edema, pneumothorax or confluent consolidation. Surgical clips in the right axilla.   Left ribs: No visualized rib fracture or evidence of focal rib abnormality. No bony destructive change. BB was placed at the site of pain overlying the lower left ribs, no radiographic abnormality subjacent to the BB.   IMPRESSION: 1. No acute rib findings.  No explanation for left lower rib pain. 2. Postradiation change in the right lung apex, similar to prior CT. Left apical pleuroparenchymal scarring. 3. Right lower lobe nodule prior CT is not seen. 4. Diffuse peribronchial thickening consistent with stated history of COPD.     Takes IV iron infusions for anemia  Lab Results  Component Value Date   WBC 10.9 (H) 05/27/2021   HGB 10.4 (L) 05/27/2021   HCT 32.3 (L) 05/27/2021   MCV 88.5 05/27/2021   PLT 251 05/27/2021   Suspect AVM chronic bleed  Has esoph varcies  Avoids nsaids  Patient Active Problem List   Diagnosis Date Noted   Left-sided chest wall pain 06/04/2021   Lung cancer ([place]) 02/25/2021   Iron deficiency anemia 01/01/2021   Iron deficiency anemia due to chronic blood loss 11/06/2020   AVM (arteriovenous malformation) of colon    Benign neoplasm of  colon    NASH (nonalcoholic steatohepatitis)    Malnutrition ([place]) 07/25/2019   Symptomatic anemia 07/25/2019   Tobacco abuse 07/25/2019   Mass of soft tissue of face 03/06/2019   HSV infection 02/23/2019   Aortic atherosclerosis ([place]) 01/22/2019   Coronary atherosclerosis 01/22/2019   Encounter for screening for lung cancer 01/04/2019   Venous insufficiency 11/15/2018   Esophageal varices without bleeding ([place]) 10/27/2018   Anemia 10/23/2018   Fatigue 10/23/2018   Poor balance 05/30/2018   Generalized weakness 05/30/2018    Cirrhosis of liver ([place]) 04/24/2018   Ascites 04/24/2018   Gallstones 04/24/2018   Heme positive stool 04/24/2018   Screening mammogram, encounter for 11/07/2017   Smoker 11/07/2017   Screening examination for STD (sexually transmitted disease) 11/11/2016   Welcome to Medicare preventive visit 10/26/2016   Estrogen deficiency 10/26/2016   Colon cancer screening 11/01/2014   Encounter for routine gynecological examination 09/12/2013   Rapid heart beat 10/10/2012   Routine general medical examination at a health care facility 07/21/2011   Vitamin D deficiency 08/04/2009   POSTMENOPAUSAL STATUS 08/04/2009   Controlled diabetes mellitus type 2 with complications ([place]) 93/71/6967   Hyperlipidemia associated with type 2 diabetes mellitus ([place]) 06/14/2008   [place] DISEASE, CERVICAL 03/30/2007   History of alcohol abuse 12/06/2006   Neuropathy of both feet 12/06/2006   DIVERTICULOSIS, COLON 12/06/2006   Fatty liver 12/06/2006   BREAST CANCER, HX OF 12/06/2006   Past Medical History:  Diagnosis Date   Alcohol abuse, unspecified    Breast cancer ([place]) 1998   Right   Cataract    left eye   Cervical spondylosis 2006   MRI   Degeneration of cervical intervertebral disc 2006   MRI   Diabetes mellitus without complication ([place])    Diverticulosis of colon (without mention of hemorrhage)    Hyperpotassemia    Lung cancer ([place])    Microscopic hematuria    Mononeuritis of unspecified site    Nonspecific abnormal results of liver function study    Other abnormal glucose    Other and unspecified hyperlipidemia    no per pt   Other chronic nonalcoholic liver disease    Personal history of chemotherapy    Personal history of malignant neoplasm of breast    Personal history of radiation therapy    Pneumothorax, acute    right, spontaneous   Tobacco use disorder    Unspecified vitamin D deficiency    Past Surgical History:  Procedure Laterality Date   BIOPSY  07/27/2019   Procedure:  BIOPSY;  Surgeon: Yetta Flock, MD;  Location: [facility];  Service: Gastroenterology;;   BREAST BIOPSY  9/03   Right   BREAST LUMPECTOMY Right 1998   CHEST TUBE INSERTION  11/02/2014   COLONOSCOPY     COLONOSCOPY WITH PROPOFOL N/A 07/27/2019   Procedure: COLONOSCOPY WITH PROPOFOL;  Surgeon: Yetta Flock, MD;  Location: [facility];  Service: Gastroenterology;  Laterality: N/A;   ESOPHAGOGASTRODUODENOSCOPY (EGD) WITH PROPOFOL N/A 10/30/2018   Procedure: ESOPHAGOGASTRODUODENOSCOPY (EGD) WITH PROPOFOL;  Surgeon: Mauri Pole, MD;  Location: [facility];  Service: Endoscopy;  Laterality: N/A;   ESOPHAGOGASTRODUODENOSCOPY (EGD) WITH PROPOFOL N/A 03/12/2019   Procedure: ESOPHAGOGASTRODUODENOSCOPY (EGD) WITH PROPOFOL;  Surgeon: Mauri Pole, MD;  Location: [facility];  Service: Endoscopy;  Laterality: N/A;   ESOPHAGOGASTRODUODENOSCOPY (EGD) WITH PROPOFOL N/A 07/27/2019   Procedure: ESOPHAGOGASTRODUODENOSCOPY (EGD) WITH PROPOFOL;  Surgeon: Yetta Flock, MD;  Location: [facility];  Service: Gastroenterology;  Laterality: N/A;  EYE SURGERY  02/2017   cataract extraction with lens implant-left   HOT HEMOSTASIS N/A 07/27/2019   Procedure: HOT HEMOSTASIS (ARGON PLASMA COAGULATION/BICAP);  Surgeon: Yetta Flock, MD;  Location: [facility];  Service: Gastroenterology;  Laterality: N/A;   MOUTH SURGERY     POLYPECTOMY  07/27/2019   Procedure: POLYPECTOMY;  Surgeon: Yetta Flock, MD;  Location: [facility];  Service: Gastroenterology;;   TUBAL LIGATION     Social History   Tobacco Use   Smoking status: Every Day    Packs/day: 1.50    Years: 58.00    Pack years: 87.00    Types: Cigarettes    Passive exposure: Past   Smokeless tobacco: Never   Tobacco comments:    0.5 ppd - 04/14/21  Vaping Use   Vaping Use: Never used  Substance Use Topics   Alcohol use: Not Currently   Drug use: No   Family History  Problem Relation Age of Onset    Heart failure Father    Heart attack Father    Colon cancer Maternal Uncle    Stroke Mother    Esophageal cancer Neg Hx    Rectal cancer Neg Hx    Stomach cancer Neg Hx    Pancreatic cancer Neg Hx    Allergies  Allergen Reactions   Oxycodone Nausea And Vomiting   Crestor [Rosuvastatin]     Abdominal pain     Glipizide Other (See Comments)    Stomach pain   Prednisone     GI upset and general malaise    Current Outpatient Medications on File Prior to Visit  Medication Sig Dispense Refill   albuterol (VENTOLIN HFA) 108 (90 Base) MCG/ACT inhaler INHALE 2 PUFFS INTO THE LUNGS EVERY 4 HOURS AS NEEDED FOR WHEEZE OR FOR SHORTNESS OF BREATH 18 each 3   budesonide-formoterol (SYMBICORT) 160-4.5 MCG/ACT inhaler TAKE 2 PUFFS BY MOUTH TWICE A DAY 10.2 each 4   cholecalciferol (VITAMIN D3) 25 MCG (1000 UNIT) tablet Take 1,000 Units by mouth 2 (two) times daily.     cyclobenzaprine (FLEXERIL) 10 MG tablet TAKE 1/2 TABLET BY MOUTH AT BEDTIME AS NEEDED FOR MUSCLE SPASMS 15 tablet 3   furosemide (LASIX) 20 MG tablet TAKE 1 TABLET BY MOUTH EVERY DAY 90 tablet 1   lactulose (CHRONULAC) 10 GM/15ML solution Take 15 mLs (10 g total) by mouth 3 (three) times daily. 236 mL 11   metFORMIN (GLUCOPHAGE-XR) 500 MG 24 hr tablet TAKE 1 TABLET BY MOUTH EVERY DAY WITH BREAKFAST 90 tablet 1   metoprolol tartrate (LOPRESSOR) 50 MG tablet Take 2 hours before CT scan 1 tablet 0   Multiple Vitamin (MULTIVITAMIN) tablet Take 1 tablet by mouth daily.     Multiple Vitamins-Minerals (PRESERVISION AREDS 2) CAPS Take 1 capsule by mouth 2 (two) times daily.     mupirocin ointment (BACTROBAN) 2 % APPLY 1 APPLICATION TOPICALLY 2 (TWO) TIMES DAILY. TO AFFECTED AREAS 22 g 2   nadolol (CORGARD) 20 MG tablet TAKE 1 TABLET BY MOUTH EVERY DAY 90 tablet 3   nystatin (MYCOSTATIN) 100000 UNIT/ML suspension TAKE 5 MLS (500,000 UNITS TOTAL) BY MOUTH 3 (THREE) TIMES DAILY. SWISH AND SWALLOW 120 mL 0   pantoprazole (PROTONIX) 40 MG tablet  TAKE 1 TABLET (40 MG TOTAL) BY MOUTH 2 (TWO) TIMES DAILY BEFORE A MEAL. 180 tablet 1   spironolactone (ALDACTONE) 25 MG tablet Take 0.5 tablets (12.5 mg total) by mouth every other day. 30 tablet 2   SYSTANE COMPLETE 0.6 %  SOLN Apply 1 drop to eye 2 (two) times daily.     Tiotropium Bromide Monohydrate (SPIRIVA RESPIMAT) 2.5 MCG/ACT AERS Inhale 2 puffs into the lungs daily. 4 g 0   ursodiol (ACTIGALL) 300 MG capsule TAKE 1 CAPSULE BY MOUTH EVERY DAY 30 capsule 1   XIFAXAN 550 MG TABS tablet TAKE 1 TABLET BY MOUTH TWICE A DAY 60 tablet 3   No current facility-administered medications on file prior to visit.     Review of Systems  Constitutional:  Positive for fatigue. Negative for activity change, appetite change, fever and unexpected weight change.  HENT:  Negative for congestion, ear pain, rhinorrhea, sinus pressure and sore throat.   Eyes:  Negative for pain, redness and visual disturbance.  Respiratory:  Positive for wheezing. Negative for cough and shortness of breath.        Left lateral rib pain now radiates to trapezius   Cardiovascular:  Negative for chest pain and palpitations.  Gastrointestinal:  Negative for abdominal pain, blood in stool, constipation and diarrhea.  Endocrine: Negative for polydipsia and polyuria.  Genitourinary:  Negative for dysuria, frequency and urgency.  Musculoskeletal:  Positive for arthralgias, back pain and myalgias.  Skin:  Negative for pallor and rash.  Allergic/Immunologic: Negative for environmental allergies.  Neurological:  Negative for dizziness, syncope and headaches.  Hematological:  Negative for adenopathy. Does not bruise/bleed easily.  Psychiatric/Behavioral:  Negative for decreased concentration and dysphoric mood. The patient is not nervous/anxious.       Objective:   Physical Exam Constitutional:      General: She is not in acute distress.    Appearance: Normal appearance. She is normal weight. She is not ill-appearing.  HENT:      Mouth/Throat:     Mouth: Mucous membranes are moist.     Pharynx: Oropharynx is clear.  Eyes:     General: No scleral icterus.    Conjunctiva/sclera: Conjunctivae normal.     Pupils: Pupils are equal, round, and reactive to light.  Cardiovascular:     Rate and Rhythm: Normal rate and regular rhythm.  Pulmonary:     Effort: Pulmonary effort is normal.     Breath sounds: Normal breath sounds.     Comments: Diffusely distant bs   Mild (improved) left lateral rib tenderness Chest:     Chest wall: Tenderness present.  Musculoskeletal:     Cervical back: Normal range of motion and neck supple. No tenderness.     Comments: Mild L trapezius tenderness  No CS tenderness  Nl rom bilat UEs and shoulders  Neg hawking test  Neg neer test    Lymphadenopathy:     Cervical: No cervical adenopathy.  Skin:    General: Skin is warm and dry.     Coloration: Skin is not jaundiced.     Findings: No rash.  Neurological:     Mental Status: She is alert.     Sensory: No sensory deficit.     Coordination: Coordination normal.     Deep Tendon Reflexes: Reflexes normal.  Psychiatric:        Mood and Affect: Mood normal.          Assessment & Plan:   Problem List Items Addressed This Visit       Other   Anemia    Monitored by hematology  Need to avoid nsaids in light of avm GI bleeding       Left-sided chest wall pain - Primary    This improved  dramatically with prednisone but she could not tolerate med  Returned when she stopped it  Now moved up into the L trap area Rev notes from Dr Georgina Snell ([facility]port med) and planning to pursue PT  Unable to take nsaid  Needs to avoid excess tylenol due to cirrhosis  Will try lido derm patch prn (she has)  For more severe pain refilled tramadol (caution of sedation and habit) Rev last imaging  -would consider imaging shoulder/TS and CS if this worsens or changes

## 2021-06-15 NOTE — Assessment & Plan Note (Signed)
Monitored by hematology  ?Need to avoid nsaids in light of avm GI bleeding  ?

## 2021-06-15 NOTE — Assessment & Plan Note (Signed)
This improved dramatically with prednisone but she could not tolerate med  ?Returned when she stopped it  ?Now moved up into the L trap area ?Rev notes from Dr Georgina Snell ([facility] med) and planning to pursue PT  ?Unable to take nsaid  ?Needs to avoid excess tylenol due to cirrhosis  ?Will try lido derm patch prn (she has)  ?For more severe pain refilled tramadol (caution of sedation and habit) ?Rev last imaging  -would consider imaging shoulder/TS and CS if this worsens or changes  ?

## 2021-06-16 ENCOUNTER — Telehealth: Payer: Self-pay | Admitting: Family Medicine

## 2021-06-16 DIAGNOSIS — R0789 Other chest pain: Secondary | ICD-10-CM

## 2021-06-16 DIAGNOSIS — M25512 Pain in left shoulder: Secondary | ICD-10-CM

## 2021-06-16 NOTE — Telephone Encounter (Signed)
Patient called stating that she was not able to start physical therapy back when Dr Georgina Snell ordered it but she is at a point now where she can. She asked if the physical therapy referral could be resent to [facility] and also include treatment of her left shoulder in addition to her left side/rib pain.  ? ?Please advise. ? ?

## 2021-06-17 ENCOUNTER — Other Ambulatory Visit: Payer: Self-pay | Admitting: Gastroenterology

## 2021-06-17 NOTE — Telephone Encounter (Signed)
Physical therapy ordered to [facility] physical therapy. ?Did include the shoulder as well. ?

## 2021-06-17 NOTE — Telephone Encounter (Signed)
Called pt and informed her that she has been re-referred to [facility] for her L rib/chest and L shoulder. ?

## 2021-06-22 ENCOUNTER — Telehealth: Payer: Self-pay | Admitting: *Deleted

## 2021-06-22 NOTE — Telephone Encounter (Signed)
Christine Barton called to follow up on getting her iron infusions at [facility]. She states she cannot get to the [facility] location and [facility] is so much closer to her home.  ? ? ?Message sent to [facility] to help arrange appts there for IV Venofer ?

## 2021-06-24 ENCOUNTER — Other Ambulatory Visit: Payer: Self-pay

## 2021-06-24 ENCOUNTER — Telehealth: Payer: Self-pay

## 2021-06-24 ENCOUNTER — Inpatient Hospital Stay: Payer: Medicare HMO | Attending: Oncology

## 2021-06-24 VITALS — BP 127/65 | HR 83 | Temp 97.5°F | Resp 16

## 2021-06-24 DIAGNOSIS — K5520 Angiodysplasia of colon without hemorrhage: Secondary | ICD-10-CM

## 2021-06-24 DIAGNOSIS — D509 Iron deficiency anemia, unspecified: Secondary | ICD-10-CM | POA: Diagnosis not present

## 2021-06-24 DIAGNOSIS — D500 Iron deficiency anemia secondary to blood loss (chronic): Secondary | ICD-10-CM

## 2021-06-24 MED ORDER — SODIUM CHLORIDE 0.9 % IV SOLN
300.0000 mg | Freq: Once | INTRAVENOUS | Status: AC
  Administered 2021-06-24: 300 mg via INTRAVENOUS
  Filled 2021-06-24: qty 300

## 2021-06-24 MED ORDER — SODIUM CHLORIDE 0.9 % IV SOLN
Freq: Once | INTRAVENOUS | Status: AC
  Filled 2021-06-24: qty 250

## 2021-06-24 MED ORDER — ACETAMINOPHEN 325 MG PO TABS: 650.0000 mg | ORAL_TABLET | Freq: Once | ORAL | Status: DC

## 2021-06-24 MED ORDER — LORATADINE 10 MG PO TABS
10.0000 mg | ORAL_TABLET | Freq: Every day | ORAL | Status: DC
  Filled 2021-06-24: qty 1

## 2021-06-24 NOTE — Telephone Encounter (Signed)
Palliative volunteer call, no answer ?

## 2021-06-24 NOTE — Patient Instructions (Signed)

## 2021-06-26 ENCOUNTER — Telehealth: Payer: Self-pay | Admitting: Family Medicine

## 2021-06-26 DIAGNOSIS — M25512 Pain in left shoulder: Secondary | ICD-10-CM | POA: Diagnosis not present

## 2021-06-26 DIAGNOSIS — R0782 Intercostal pain: Secondary | ICD-10-CM | POA: Diagnosis not present

## 2021-06-26 DIAGNOSIS — M6281 Muscle weakness (generalized): Secondary | ICD-10-CM | POA: Diagnosis not present

## 2021-06-26 NOTE — Chronic Care Management (AMB) (Signed)
?  Chronic Care Management  ? ?Note ? ?06/26/2021 ?Name: Christine Barton MRN: [id number] DOB: [birth year] ? ?Christine Barton is a 71 y.o. year old female who is a primary care patient of Tower, [name], MD. I reached out to [name] by phone today in response to a referral sent by Christine Barton's PCP, Tower, [name], MD.  ? ?Christine Barton was given information about Chronic Care Management services today including:  ?CCM service includes personalized support from designated clinical staff supervised by her physician, including individualized plan of care and coordination with other care providers ?24/7 contact phone numbers for assistance for urgent and routine care needs. ?Service will only be billed when office clinical staff spend 20 minutes or more in a month to coordinate care. ?Only one practitioner may furnish and bill the service in a calendar month. ?The patient may stop CCM services at any time (effective at the end of the month) by phone call to the office staff. ? ? ?Patient agreed to services and verbal consent obtained.  ? ?Follow up plan: ? ? ?Tatjana Dellinger ?Upstream Scheduler  ?

## 2021-06-29 ENCOUNTER — Encounter: Payer: Self-pay | Admitting: Gastroenterology

## 2021-06-29 ENCOUNTER — Other Ambulatory Visit (INDEPENDENT_AMBULATORY_CARE_PROVIDER_SITE_OTHER): Payer: Medicare HMO

## 2021-06-29 ENCOUNTER — Ambulatory Visit: Payer: Medicare HMO | Admitting: Gastroenterology

## 2021-06-29 VITALS — BP 138/80 | HR 50 | Ht 64.5 in | Wt 112.0 lb

## 2021-06-29 DIAGNOSIS — K7031 Alcoholic cirrhosis of liver with ascites: Secondary | ICD-10-CM

## 2021-06-29 DIAGNOSIS — D509 Iron deficiency anemia, unspecified: Secondary | ICD-10-CM | POA: Diagnosis not present

## 2021-06-29 LAB — COMPREHENSIVE METABOLIC PANEL
ALT: 7 U/L (ref 0–35)
AST: 17 U/L (ref 0–37)
Albumin: 4.4 g/dL (ref 3.5–5.2)
Alkaline Phosphatase: 85 U/L (ref 39–117)
BUN: 14 mg/dL (ref 6–23)
CO2: 29 mEq/L (ref 19–32)
Calcium: 10.2 mg/dL (ref 8.4–10.5)
Chloride: 99 mEq/L (ref 96–112)
Creatinine, Ser: 0.75 mg/dL (ref 0.40–1.20)
GFR: 80.59 mL/min (ref >60.00)
Glucose, Bld: 204 mg/dL — ABNORMAL HIGH (ref 70–99)
Potassium: 4.6 mEq/L (ref 3.5–5.1)
Sodium: 135 mEq/L (ref 135–145)
Total Bilirubin: 0.5 mg/dL (ref 0.2–1.2)
Total Protein: 7.3 g/dL (ref 6.0–8.3)

## 2021-06-29 LAB — CBC WITH DIFFERENTIAL/PLATELET
Basophils Absolute: 0 10*3/uL (ref 0.0–0.1)
Basophils Relative: 0.4 % (ref 0.0–3.0)
Eosinophils Absolute: 0.1 10*3/uL (ref 0.0–0.7)
Eosinophils Relative: 0.7 % (ref 0.0–5.0)
HCT: 31.7 % — ABNORMAL LOW (ref 36.0–46.0)
Hemoglobin: 10.4 g/dL — ABNORMAL LOW (ref 12.0–15.0)
Lymphocytes Relative: 9.6 % — ABNORMAL LOW (ref 12.0–46.0)
Lymphs Abs: 0.7 10*3/uL (ref 0.7–4.0)
MCHC: 32.7 g/dL (ref 30.0–36.0)
MCV: 83.7 fl (ref 78.0–100.0)
Monocytes Absolute: 0.5 10*3/uL (ref 0.1–1.0)
Monocytes Relative: 6.8 % (ref 3.0–12.0)
Neutro Abs: 6.4 10*3/uL (ref 1.4–7.7)
Neutrophils Relative %: 82.5 % — ABNORMAL HIGH (ref 43.0–77.0)
Platelets: 234 10*3/uL (ref 150.0–400.0)
RBC: 3.79 Mil/uL — ABNORMAL LOW (ref 3.87–5.11)
RDW: 19.8 % — ABNORMAL HIGH (ref 11.5–15.5)
WBC: 7.8 10*3/uL (ref 4.0–10.5)

## 2021-06-29 LAB — PROTIME-INR
INR: 1.1 ratio — ABNORMAL HIGH (ref 0.8–1.0)
Prothrombin Time: 12.4 s (ref 9.6–13.1)

## 2021-06-29 NOTE — Patient Instructions (Addendum)
Your provider has requested that you go to the basement level for lab work before leaving today. Press "B" on the elevator. The lab is located at the first door on the left as you exit the elevator.  ? ?STOP Spironolactone  ? ?Decrease Pantoprazole to 40 mg daily ? ?Follow up in 3 months ? ?Due to recent changes in healthcare laws, you may see the results of your imaging and laboratory studies on MyChart before your provider has had a chance to review them.  We understand that in some cases there may be results that are confusing or concerning to you. Not all laboratory results come back in the same time frame and the provider may be waiting for multiple results in order to interpret others.  Please give [place] 48 hours in order for your provider to thoroughly review all the results before contacting the office for clarification of your results.   ? ?If you are age 31 or older, your body mass index should be between 23-30. Your Body mass index is 18.93 kg/m?Marland Kitchen If this is out of the aforementioned range listed, please consider follow up with your Primary Care Provider. ? ?If you are age 76 or younger, your body mass index should be between 19-25. Your Body mass index is 18.93 kg/m?Marland Kitchen If this is out of the aformentioned range listed, please consider follow up with your Primary Care Provider.  ? ?________________________________________________________ ? ?The [facility] providers would like to encourage you to use [facility] to communicate with providers for non-urgent requests or questions.  Due to long hold times on the telephone, sending your provider a message by [facility] may be a faster and more efficient way to get a response.  Please allow 48 business hours for a response.  Please remember that this is for non-urgent requests.  ?_______________________________________________________  ? ?I appreciate the  opportunity to care for you ? ?Thank You  ? ?Harl Bowie , MD  ? ?

## 2021-06-29 NOTE — Progress Notes (Signed)
? ?       ? ?Christine Barton    [id number]    03-15-1951 ? ?Primary Care Physician:Tower, [name], MD ? ?Referring Physician: Tower, [name], MD ?[facility] [place] ?[place],  [name] [address] ? ? ?Chief complaint: GERD, cirrhosis ? ?HPI: ? ?71 year old very pleasant female with history of breast cancer, type 2 diabetes, COPD, chronic anemia secondary to occult GI blood loss and decompensated cirrhosis here for follow-up visit ?  ?Left side rib pain and abdominal pain is improving since she is doing physical therapy. ?  ?Recently diagnosed lung cancer, s/p radiation therapy ?  ?Denies any nausea, vomiting, melena or bright red blood per rectum.  No other episodes of confusion, lower extremity edema or worsening abdominal distention. ?  ?  ?She is receiving IV iron infusion, hemoglobin on saturation.  She is being managed by hematology ?  ?MRI abdomen/liver December 31, 2020 ?1. Cirrhosis. No liver masses. ?2. Trace perihepatic ascites.  Normal size spleen. ?3. Cholelithiasis. Chronic mild central intrahepatic and ?extrahepatic biliary ductal dilatation, unchanged. CBD diameter 9 ?mm. No evidence of choledocholithiasis. ?4. Chronic mild dilation of the dorsal pancreatic duct in the ?pancreatic head, unchanged, nonspecific. No pancreatic mass. ?  ?Abdominal ultrasound April 30, 2020 ?1. Cirrhotic liver morphology.  No focal liver lesion is identified. ?2. Cholelithiasis with a positive sonographic Murphy sign. Findings ?raise suspicion for acute cholecystitis. ?  ?Normal AFP 3.7 in January 2022 ?MRI liver 01-08-2020: Stable cirrhosis with no evidence of hepatocellular carcinoma. Choledocholithiasis with stable CBD. Increased iron deposition in the setting of IV iron therapy ?  ?EGD July 27, 2019 by Dr. Havery Moros: Small esophageal varices and portal hypertensive gastropathy otherwise normal exam ?  ?Colonoscopy July 27, 2019: 8 small AVMs scattered in the colon ablated with APC and 3 small polyps removed,  diverticulosis and internal hemorrhoids ?  ?EGD February 23, 2019: Small grade 1 esophageal varices, portal hypertensive gastropathy ?  ?Colonoscopy November 30, 2016: Diverticulosis, sessile polyps X4 tubular adenomas and internal hemorrhoids.  Recall colonoscopy in 3 years  ?  ?  ? ? ? ?Outpatient Encounter Medications as of 06/29/2021  ?Medication Sig  ? albuterol (VENTOLIN HFA) 108 (90 Base) MCG/ACT inhaler INHALE 2 PUFFS INTO THE LUNGS EVERY 4 HOURS AS NEEDED FOR WHEEZE OR FOR SHORTNESS OF BREATH  ? budesonide-formoterol (SYMBICORT) 160-4.5 MCG/ACT inhaler TAKE 2 PUFFS BY MOUTH TWICE A DAY  ? cholecalciferol (VITAMIN D3) 25 MCG (1000 UNIT) tablet Take 1,000 Units by mouth 2 (two) times daily.  ? furosemide (LASIX) 20 MG tablet TAKE 1 TABLET BY MOUTH EVERY DAY  ? lactulose (CHRONULAC) 10 GM/15ML solution Take 15 mLs (10 g total) by mouth 3 (three) times daily.  ? metFORMIN (GLUCOPHAGE-XR) 500 MG 24 hr tablet TAKE 1 TABLET BY MOUTH EVERY DAY WITH BREAKFAST  ? Multiple Vitamin (MULTIVITAMIN) tablet Take 1 tablet by mouth daily.  ? Multiple Vitamins-Minerals (PRESERVISION AREDS 2) CAPS Take 1 capsule by mouth 2 (two) times daily.  ? mupirocin ointment (BACTROBAN) 2 % APPLY 1 APPLICATION TOPICALLY 2 (TWO) TIMES DAILY. TO AFFECTED AREAS  ? nadolol (CORGARD) 20 MG tablet TAKE 1 TABLET BY MOUTH EVERY DAY  ? nystatin (MYCOSTATIN) 100000 UNIT/ML suspension TAKE 5 MLS (500,000 UNITS TOTAL) BY MOUTH 3 (THREE) TIMES DAILY. SWISH AND SWALLOW  ? pantoprazole (PROTONIX) 40 MG tablet TAKE 1 TABLET (40 MG TOTAL) BY MOUTH 2 (TWO) TIMES DAILY BEFORE A MEAL.  ? spironolactone (ALDACTONE) 25 MG tablet Take 0.5 tablets (  12.5 mg total) by mouth every other day.  ? SYSTANE COMPLETE 0.6 % SOLN Apply 1 drop to eye 2 (two) times daily.  ? traMADol (ULTRAM) 50 MG tablet Take 1 tablet (50 mg total) by mouth every 8 (eight) hours as needed for severe pain.  ? ursodiol (ACTIGALL) 300 MG capsule TAKE 1 CAPSULE BY MOUTH EVERY DAY  ? XIFAXAN 550 MG  TABS tablet TAKE 1 TABLET BY MOUTH TWICE A DAY  ? [DISCONTINUED] cyclobenzaprine (FLEXERIL) 10 MG tablet TAKE 1/2 TABLET BY MOUTH AT BEDTIME AS NEEDED FOR MUSCLE SPASMS  ? [DISCONTINUED] metoprolol tartrate (LOPRESSOR) 50 MG tablet Take 2 hours before CT scan  ? [DISCONTINUED] Tiotropium Bromide Monohydrate (SPIRIVA RESPIMAT) 2.5 MCG/ACT AERS Inhale 2 puffs into the lungs daily.  ? ?No facility-administered encounter medications on file as of 06/29/2021.  ? ? ?Allergies as of 06/29/2021 - Review Complete 06/29/2021  ?Allergen Reaction Noted  ? Oxycodone Nausea And Vomiting 11/02/2014  ? Crestor [rosuvastatin]  11/12/2020  ? Glipizide Other (See Comments) 09/12/2013  ? Prednisone  06/15/2021  ? ? ?Past Medical History:  ?Diagnosis Date  ? Alcohol abuse, unspecified   ? Breast cancer ([place]) 1998  ? Right  ? Cataract   ? left eye  ? Cervical spondylosis 2006  ? MRI  ? Degeneration of cervical intervertebral disc 2006  ? MRI  ? Diabetes mellitus without complication ([place])   ? Diverticulosis of colon (without mention of hemorrhage)   ? Hyperpotassemia   ? Lung cancer ([place]) 03/2021  ? started radiation in January 2023, right side  ? Microscopic hematuria   ? Mononeuritis of unspecified site   ? Nonspecific abnormal results of liver function study   ? Other abnormal glucose   ? Other and unspecified hyperlipidemia   ? no per pt  ? Other chronic nonalcoholic liver disease   ? Personal history of chemotherapy   ? Personal history of malignant neoplasm of breast   ? Personal history of radiation therapy   ? Pneumothorax, acute   ? right, spontaneous  ? Tobacco use disorder   ? Unspecified vitamin D deficiency   ? ? ?Past Surgical History:  ?Procedure Laterality Date  ? BIOPSY  07/27/2019  ? Procedure: BIOPSY;  Surgeon: Yetta Flock, MD;  Location: [facility];  Service: Gastroenterology;;  ? BREAST BIOPSY  9/03  ? Right  ? BREAST LUMPECTOMY Right 1998  ? CHEST TUBE INSERTION  11/02/2014  ? COLONOSCOPY    ? COLONOSCOPY  WITH PROPOFOL N/A 07/27/2019  ? Procedure: COLONOSCOPY WITH PROPOFOL;  Surgeon: Yetta Flock, MD;  Location: [facility];  Service: Gastroenterology;  Laterality: N/A;  ? ESOPHAGOGASTRODUODENOSCOPY (EGD) WITH PROPOFOL N/A 10/30/2018  ? Procedure: ESOPHAGOGASTRODUODENOSCOPY (EGD) WITH PROPOFOL;  Surgeon: Mauri Pole, MD;  Location: [facility];  Service: Endoscopy;  Laterality: N/A;  ? ESOPHAGOGASTRODUODENOSCOPY (EGD) WITH PROPOFOL N/A 03/12/2019  ? Procedure: ESOPHAGOGASTRODUODENOSCOPY (EGD) WITH PROPOFOL;  Surgeon: Mauri Pole, MD;  Location: [facility];  Service: Endoscopy;  Laterality: N/A;  ? ESOPHAGOGASTRODUODENOSCOPY (EGD) WITH PROPOFOL N/A 07/27/2019  ? Procedure: ESOPHAGOGASTRODUODENOSCOPY (EGD) WITH PROPOFOL;  Surgeon: Yetta Flock, MD;  Location: [facility];  Service: Gastroenterology;  Laterality: N/A;  ? EYE SURGERY  02/2017  ? cataract extraction with lens implant-left  ? HOT HEMOSTASIS N/A 07/27/2019  ? Procedure: HOT HEMOSTASIS (ARGON PLASMA COAGULATION/BICAP);  Surgeon: Yetta Flock, MD;  Location: [facility];  Service: Gastroenterology;  Laterality: N/A;  ? MOUTH SURGERY    ? POLYPECTOMY  07/27/2019  ? Procedure: POLYPECTOMY;  Surgeon: Yetta Flock, MD;  Location: [facility];  Service: Gastroenterology;;  ? TUBAL LIGATION    ? ? ?Family History  ?Problem Relation Age of Onset  ? Heart failure Father   ? Heart attack Father   ? Colon cancer Maternal Uncle   ? Stroke Mother   ? Esophageal cancer Neg Hx   ? Rectal cancer Neg Hx   ? Stomach cancer Neg Hx   ? Pancreatic cancer Neg Hx   ? ? ?Social History  ? ?Socioeconomic History  ? Marital status: Single  ?  Spouse name: Not on file  ? Number of children: 1  ? Years of education: Not on file  ? Highest education level: Not on file  ?Occupational History  ? Occupation: retired  ?  Employer: [employer]  ?Tobacco Use  ? Smoking status: Every Day  ?  Packs/day: 1.00  ?  Years: 58.00  ?  Pack years:  58.00  ?  Types: Cigarettes  ?  Passive exposure: Past  ? Smokeless tobacco: Never  ? Tobacco comments:  ?  0.5 ppd - 04/14/21  ?Vaping Use  ? Vaping Use: Never used  ?Substance and Sexual Activity  ? [place] Levels

## 2021-06-30 DIAGNOSIS — M6281 Muscle weakness (generalized): Secondary | ICD-10-CM | POA: Diagnosis not present

## 2021-06-30 DIAGNOSIS — M25512 Pain in left shoulder: Secondary | ICD-10-CM | POA: Diagnosis not present

## 2021-06-30 DIAGNOSIS — R0782 Intercostal pain: Secondary | ICD-10-CM | POA: Diagnosis not present

## 2021-06-30 LAB — AFP TUMOR MARKER: AFP-Tumor Marker: 3.5 ng/mL

## 2021-07-01 ENCOUNTER — Inpatient Hospital Stay: Payer: Medicare HMO

## 2021-07-01 ENCOUNTER — Other Ambulatory Visit: Payer: Self-pay

## 2021-07-01 DIAGNOSIS — D500 Iron deficiency anemia secondary to blood loss (chronic): Secondary | ICD-10-CM

## 2021-07-01 DIAGNOSIS — D509 Iron deficiency anemia, unspecified: Secondary | ICD-10-CM | POA: Diagnosis not present

## 2021-07-01 MED ORDER — SODIUM CHLORIDE 0.9 % IV SOLN
INTRAVENOUS | Status: DC
  Filled 2021-07-01: qty 250

## 2021-07-01 MED ORDER — SODIUM CHLORIDE 0.9 % IV SOLN
300.0000 mg | Freq: Once | INTRAVENOUS | Status: AC
  Administered 2021-07-01: 300 mg via INTRAVENOUS
  Filled 2021-07-01: qty 300

## 2021-07-02 ENCOUNTER — Ambulatory Visit: Payer: Medicare HMO

## 2021-07-02 DIAGNOSIS — R931 Abnormal findings on diagnostic imaging of heart and coronary circulation: Secondary | ICD-10-CM | POA: Insufficient documentation

## 2021-07-02 NOTE — Progress Notes (Deleted)
Patient ID: Christine Barton                 DOB: May 17, 1950                    MRN: [id number] ? ? ? ? ?HPI: ?Christine Barton is a 71 y.o. female patient referred to lipid clinic by Dr Gardiner Rhyme. PMH is significant for ASCVD, coronary calcium, lung cancer, cirrhosis or the liver, HLD, and controlled T2DM. ? ?Current Medications: N/A ?Intolerances:  ?Risk Factors:  ?LDL goal:  ? ?Diet:  ? ?Exercise:  ? ?Family History:  ? ?Social History:  ? ?Labs: Coronary calcium score of 598. This was 94th percentile for age, ?sex, and race matched control. ? ?CAD-RADS 3. Moderate stenosis. Consider symptom-guided ?anti-ischemic pharmacotherapy as well as risk factor modification ?per guideline directed care. Additional analysis with CT FFR will be ?submitted. ? ?Past Medical History:  ?Diagnosis Date  ? Alcohol abuse, unspecified   ? Breast cancer ([place]) 1998  ? Right  ? Cataract   ? left eye  ? Cervical spondylosis 2006  ? MRI  ? Degeneration of cervical intervertebral disc 2006  ? MRI  ? Diabetes mellitus without complication ([place])   ? Diverticulosis of colon (without mention of hemorrhage)   ? Hyperpotassemia   ? Lung cancer ([place]) 03/2021  ? started radiation in January 2023, right side  ? Microscopic hematuria   ? Mononeuritis of unspecified site   ? Nonspecific abnormal results of liver function study   ? Other abnormal glucose   ? Other and unspecified hyperlipidemia   ? no per pt  ? Other chronic nonalcoholic liver disease   ? Personal history of chemotherapy   ? Personal history of malignant neoplasm of breast   ? Personal history of radiation therapy   ? Pneumothorax, acute   ? right, spontaneous  ? Tobacco use disorder   ? Unspecified vitamin D deficiency   ? ? ?Current Outpatient Medications on File Prior to Visit  ?Medication Sig Dispense Refill  ? albuterol (VENTOLIN HFA) 108 (90 Base) MCG/ACT inhaler INHALE 2 PUFFS INTO THE LUNGS EVERY 4 HOURS AS NEEDED FOR WHEEZE OR FOR SHORTNESS OF BREATH 18 each 3  ?  budesonide-formoterol (SYMBICORT) 160-4.5 MCG/ACT inhaler TAKE 2 PUFFS BY MOUTH TWICE A DAY 10.2 each 4  ? cholecalciferol (VITAMIN D3) 25 MCG (1000 UNIT) tablet Take 1,000 Units by mouth 2 (two) times daily.    ? furosemide (LASIX) 20 MG tablet TAKE 1 TABLET BY MOUTH EVERY DAY 90 tablet 1  ? lactulose (CHRONULAC) 10 GM/15ML solution Take 15 mLs (10 g total) by mouth 3 (three) times daily. 236 mL 11  ? metFORMIN (GLUCOPHAGE-XR) 500 MG 24 hr tablet TAKE 1 TABLET BY MOUTH EVERY DAY WITH BREAKFAST 90 tablet 1  ? Multiple Vitamin (MULTIVITAMIN) tablet Take 1 tablet by mouth daily.    ? Multiple Vitamins-Minerals (PRESERVISION AREDS 2) CAPS Take 1 capsule by mouth 2 (two) times daily.    ? mupirocin ointment (BACTROBAN) 2 % APPLY 1 APPLICATION TOPICALLY 2 (TWO) TIMES DAILY. TO AFFECTED AREAS 22 g 2  ? nadolol (CORGARD) 20 MG tablet TAKE 1 TABLET BY MOUTH EVERY DAY 90 tablet 3  ? nystatin (MYCOSTATIN) 100000 UNIT/ML suspension TAKE 5 MLS (500,000 UNITS TOTAL) BY MOUTH 3 (THREE) TIMES DAILY. SWISH AND SWALLOW 120 mL 0  ? pantoprazole (PROTONIX) 40 MG tablet TAKE 1 TABLET (40 MG TOTAL) BY MOUTH 2 (TWO) TIMES DAILY BEFORE A MEAL. [place]  tablet 1  ? SYSTANE COMPLETE 0.6 % SOLN Apply 1 drop to eye 2 (two) times daily.    ? traMADol (ULTRAM) 50 MG tablet Take 1 tablet (50 mg total) by mouth every 8 (eight) hours as needed for severe pain. 15 tablet 0  ? ursodiol (ACTIGALL) 300 MG capsule TAKE 1 CAPSULE BY MOUTH EVERY DAY 30 capsule 1  ? XIFAXAN 550 MG TABS tablet TAKE 1 TABLET BY MOUTH TWICE A DAY 60 tablet 3  ? ?No current facility-administered medications on file prior to visit.  ? ? ?Allergies  ?Allergen Reactions  ? Oxycodone Nausea And Vomiting  ? Crestor [Rosuvastatin]   ?  Abdominal pain  ?  ? Glipizide Other (See Comments)  ?  Stomach pain  ? Prednisone   ?  GI upset and general malaise   ? ? ?Assessment/Plan: ? ?1. Hyperlipidemia -  ?  ?

## 2021-07-06 ENCOUNTER — Ambulatory Visit: Payer: Medicare HMO

## 2021-07-07 DIAGNOSIS — M6281 Muscle weakness (generalized): Secondary | ICD-10-CM | POA: Diagnosis not present

## 2021-07-07 DIAGNOSIS — M25512 Pain in left shoulder: Secondary | ICD-10-CM | POA: Diagnosis not present

## 2021-07-07 DIAGNOSIS — R0782 Intercostal pain: Secondary | ICD-10-CM | POA: Diagnosis not present

## 2021-07-08 ENCOUNTER — Other Ambulatory Visit: Payer: Self-pay | Admitting: Oncology

## 2021-07-08 ENCOUNTER — Inpatient Hospital Stay: Payer: Medicare HMO

## 2021-07-08 VITALS — BP 126/60 | HR 68 | Temp 98.9°F | Resp 16

## 2021-07-08 DIAGNOSIS — D509 Iron deficiency anemia, unspecified: Secondary | ICD-10-CM | POA: Diagnosis not present

## 2021-07-08 DIAGNOSIS — D500 Iron deficiency anemia secondary to blood loss (chronic): Secondary | ICD-10-CM

## 2021-07-08 DIAGNOSIS — K5520 Angiodysplasia of colon without hemorrhage: Secondary | ICD-10-CM

## 2021-07-08 MED ORDER — SODIUM CHLORIDE 0.9 % IV SOLN
Freq: Once | INTRAVENOUS | Status: AC
  Filled 2021-07-08: qty 250

## 2021-07-08 MED ORDER — SODIUM CHLORIDE 0.9 % IV SOLN
300.0000 mg | Freq: Once | INTRAVENOUS | Status: AC
  Administered 2021-07-08: 300 mg via INTRAVENOUS
  Filled 2021-07-08: qty 300

## 2021-07-09 ENCOUNTER — Telehealth: Payer: Self-pay | Admitting: Pulmonary Disease

## 2021-07-09 ENCOUNTER — Telehealth: Payer: Self-pay

## 2021-07-09 MED ORDER — FLUCONAZOLE 100 MG PO TABS: 100.0000 mg | ORAL_TABLET | Freq: Every day | ORAL | 0 refills | Status: DC

## 2021-07-09 NOTE — Telephone Encounter (Signed)
[facility] - Client ?TELEPHONE ADVICE RECORD ?[facility]? ?Patient ?Name: ?Christine Barton ?[place] ?Gender: Female ?DOB: 1950/06/01 ?Age: 71 Y 42 M 28 D ?Return ?Phone ?Number: ?[id number] ?(Primary), ?[phone] ?(Secondary) ?Address: ?City/ ?State/ ?Zip: ?[place] ?[address] ?Client [place] [facility] - Client ?Client Site [place] ?Provider Tower, Roque Lias - MD ?Contact Type Call ?Who Is Calling Patient / Member / Family / Caregiver ?Call Type Triage / Clinical ?Relationship To Patient Self ?Return Phone Number [phone] (Primary) ?Chief Complaint CHEST PAIN - pain, pressure, heaviness or ?tightness ?Reason for Call Symptomatic / Request for Health Information ?Initial Comment Caller states she has chest pain. States her ?pulmonary doctor told her to contact PCP. ?Translation No ?Nurse Assessment ?Nurse: Thad Ranger RN, Denise Date/Time (Eastern Time): 07/09/2021 9:40:58 AM ?Confirm and document reason for call. If ?symptomatic, describe symptoms. ---Caller states she has chest pain w/slight SOB. ?Does the patient have any new or worsening ?symptoms? ---Yes ?Will a triage be completed? ---Yes ?Related visit to physician within the last 2 weeks? ---Yes ?Does the PT have any chronic conditions? (i.e. ?diabetes, asthma, this includes High risk factors for ?pregnancy, etc.) ?---Yes ?List chronic conditions. ---Lung CA w/radiation in Jan; Alcoholic Cirrhosis ?Is this a behavioral health or substance abuse call? ---No ?Guidelines ?Guideline Title Affirmed Question Affirmed Notes Nurse Date/Time (Eastern ?Time) ?Chest Pain [1] Chest pain lasts > ?5 minutes AND [2] ?age > 56 ?Thad Ranger RNLangley Gauss 07/09/2021 9:43:23 ?AM ?Disp. Time (Eastern ?Time) Disposition Final User ?07/09/2021 9:36:58 AM Send to Urgent Queue Linda Hedges, Leilani ?07/09/2021 9:47:38 AM 911 Outcome Documentation Carmon, RN, Langley Gauss ?Reason: Refused to be seen in ER and ?insisted on trans to  MDO for appt. ?PLEASE NOTE: All timestamps contained within this report are represented as [place] Standard Time. ?CONFIDENTIALTY NOTICE: This fax transmission is intended only for the addressee. It contains information that is legally privileged, confidential or ?otherwise protected from use or disclosure. If you are not the intended recipient, you are strictly prohibited from reviewing, disclosing, copying using ?or disseminating any of this information or taking any action in reliance on or regarding this information. If you have received this fax in error, please ?notify us immediately by telephone so that we can arrange for its return to [place]. Phone: [phone], Toll-Free: [phone], Fax: [phone] ?Page: 2 of 2 ?Call Id: [id number] ?07/09/2021 9:46:25 AM Call EMS 911 Now Yes Carmon, RN, Langley Gauss ?Caller Disagree/Comply Disagree ?Caller Understands Yes ?PreDisposition Call Doctor ?Care Advice Given Per Guideline ?CALL EMS 911 NOW: CARE ADVICE given per Chest Pain (Adult) guideline. ?Referrals ?GO TO FACILITY REFUSE ?

## 2021-07-09 NOTE — Telephone Encounter (Signed)
I spoke with pt; pt said that this is a problem for Dr Patsey Berthold and she was thrown over to DR [employer] and pt does not think this is Dr Alba Cory problem and when she called DR Patsey Berthold office she just wanted to talk or see Dr Patsey Berthold about this because pt is convinced that the Spiriva has opened up her chest too much. Pt said the chest pain comes and goes and is usually worse when she coughs or lays down. I again offered for pt to see provider at [facility] because pt said she was not going to ED because she does not need ED. Pt thanked me for offering appt but again thinks she needs to talk or see pulmonologist. Pt said she never had this type problem while taking Symbicort. Pt said if she has to wait for appt already has with DR Patsey Berthold on 08/06/21 pt will just wait. I offered to try and get [facility] pul[facility]ology on phone for pt and warm transfer pt. I called [facility] and spoke with Melissa who will speak with pt. ED precautions given and if needed pt can call [facility]. Pt voiced understanding and appreciative. Warm transfer went thru to [facility]. Sending note to Dr Glori Bickers and [place] CMA. ?

## 2021-07-09 NOTE — Telephone Encounter (Signed)
Dr. Patsey Berthold, please advise.  ?Patient would like to talk with you directly.  ? ?

## 2021-07-09 NOTE — Telephone Encounter (Signed)
Thanks for letting me know. Please schedule with fist available if she changes her mind.   ?I have to be out soon until Tuesday but will be on and off the computer  ? ?

## 2021-07-09 NOTE — Telephone Encounter (Signed)
Patient is aware of below message and voiced her understanding. She will contact PCP. ?She denies reflux sx.  ?Nothing further needed.  ? ?

## 2021-07-09 NOTE — Telephone Encounter (Addendum)
If her symptoms have not improved with being 1 month off of the medication it is not the medication that caused the symptoms.  She should have her primary care evaluate her and then determine if other tests are necessary.  Is she having reflux symptoms? ?

## 2021-07-09 NOTE — Telephone Encounter (Signed)
Spoke with patient, she states that she quit Spiriva 1 month ago.  Her current pain issues cannot be due to Spiriva which she quit 1 month ago.  She has had issues with recurrent thrush and most recent bout was less than a week ago.  Her sensation of discomfort is chest and epigastric towards the left.  There is a possibility that she may have esophageal candidiasis.  I have recommended that she stop Symbicort.  She is to use albuterol as needed for her respiratory issues.  She has had recent extensive imaging and cardiac work-up which was negative.  There was no other pathology noted on the chest otherwise than that already noted and chronic.  We will give a trial to fluconazole 100 mg daily for 14 days. ?

## 2021-07-09 NOTE — Telephone Encounter (Signed)
Fluconazole sent to preferred pharmacy. ?Nothing further needed.  ? ?

## 2021-07-09 NOTE — Telephone Encounter (Signed)
Spoke to patient.  ?She stated that she was given a sample of Spiriva at last OV.  ?She has completed two full samples. Since starting spiriva, she has developed pain in the center of her chest and worsening prod cough with clear to white sputum. Pain worsens at night when laying on left side.  ?She has been off of spiriva for 37moand sx have not improved. ? ?Dr. GPatsey Berthold please advise. Thanks. ?

## 2021-07-10 ENCOUNTER — Telehealth: Payer: Self-pay | Admitting: Family Medicine

## 2021-07-10 DIAGNOSIS — R0782 Intercostal pain: Secondary | ICD-10-CM | POA: Diagnosis not present

## 2021-07-10 DIAGNOSIS — M25512 Pain in left shoulder: Secondary | ICD-10-CM | POA: Diagnosis not present

## 2021-07-10 DIAGNOSIS — M6281 Muscle weakness (generalized): Secondary | ICD-10-CM | POA: Diagnosis not present

## 2021-07-10 NOTE — Telephone Encounter (Signed)
I spoke with pt and she was wanting to verify with Dr Glori Bickers it was OK for her to take fluconazole that was given to pt by Dr Patsey Berthold. Pt said she likes for Dr Glori Bickers to [facility] all meds that pt is taking. I explained that Dr Glori Bickers was not in office today and would be returning 07/14/21 and I offered to send phone note but pt said no. Sending note to Dr Glori Bickers and [place] CMA. ?

## 2021-07-10 NOTE — Telephone Encounter (Signed)
Pt stopped by and wants to speak with Dr. Glori Bickers about medication (fluconazole (DIFLUCAN) 100 MG tablet). She stated she has liver problems. We offered her a triage form but patient did not want to fill it out. ?

## 2021-07-12 NOTE — Telephone Encounter (Signed)
I don't think there would be a liver precaution for that medication. I think it should be fine and hope it helps.   She can double check with pharmacy re: any possible med interactions  ?

## 2021-07-13 ENCOUNTER — Telehealth: Payer: Self-pay

## 2021-07-13 NOTE — Progress Notes (Signed)
? ? ?Chronic Care Management ?Pharmacy Assistant  ? ?Name: Christine Barton  MRN: [id number] DOB: 1950-10-18 ? ?Reason for Encounter: CCM (Initial Questions) ?  ?Recent office visits:  ?06/15/21 Loura Pardon, MD F/U inflammation of chest wall. Stop completed course: Prednisone 10 mg. Patient will try Lido Derm Patch PRN (Patient has patch).  ?06/04/21 Loura Pardon, MD Inflammation of chest wall: Start: Prednisone 10 mg Stop due to patient preference: TRANXENE 3.75 MG and RESTORIL 7.5 MG. Stop due to completed course: Lidocaine 4%.   ? ?Recent consult visits:  ?07/09/21 Vernard Gambles, MD (Pulmonology): Telephone: Start: Fluconazole 100 mg. Stopped Spiriva 1 month ago due to recurrent thrush and chest discomfort.  ?07/08/21 Oncology Iron Infusion - VENOFER 300 mg administered. ?07/01/21 Oncology Iron Infusion - VENOFER 300 mg administered. ?06/29/21 Harl Bowie, MD Gertie [facility]ey): Alcoholic Cirrhosis: Abnormal Labs "Blood glucose is elevated at 204, was not a fasting sample.  Otherwise rest of the labs are stable with no significant change.  Potassium is improved, is within normal range".  Stop: FLEXERIL 10 MG, LOPRESSOR 50 MG, ALDACTONE 25 MG and SPIRIVA RESPIMAT 2.5 MCG. ?06/24/21 Oncology Iron Infusion - VENOFER 300 mg administered. ?05/29/21 Oswaldo Milian, MD (Cardiology): Decrease Spironolactone 12.5 mg every other day due to shoulder pain.  ?05/28/21 Curt Bears, MD (Oncology): Telephone: Iron Level still low: Order: Iron Infusion ?05/27/21 Curt Bears, MD (Oncology): Iron deficiency: No med changes.  ?05/26/21 Oswaldo Milian, MD (Cardiology): Abnormal Labs "Troponin negative.  ESR/CRP are elevated, but no EKG changes to suggest pericarditis. Check echocardiogram to make sure no pericardial effusion.  Potassium mildly elevated, recommend reducing spironolactone to 12.5 mg daily.  Repeat BMET in 1 week.". Start: Metoprolol 50 mg 2 hrs before CT Scan.  ?05/11/21 Noreene Filbert, MD (Oncology): 1  month fu SBRT. 3 month f/u w/ CT Scan.  ?04/14/21 Vernard Gambles, MD (Pulmonology): COPD: Start: SPIRIVA RESPIMAT 2.5 MCG ?03/31/21 SBRT Treatment FINAL TREATMENT ?03/26/21 SBRT Treatment ?03/24/21 SBRT Treatment ?03/19/21 SBRT Treatment ?03/18/21  Oncology Iron Infusion - VENOFER 300 mg administered. ?03/13/21 Harl Bowie, MD (Gastro) Cirrhosis. No med changes.  ?03/12/21 SBRT Treatment ?03/11/21 Oncology Iron Infusion - VENOFER 300 mg administered. ?11/22/222 Loralie Champagne - Physical Therapy - Left upper quadrant pain ?02/26/21 Noreene Filbert, MD (Rad Oncology): Lung cancer: Recommended: SBRT Empirically. Patient refused.  ?02/25/21 Cassandra Heilingoetter, PA (Oncology): AVM - Abnormal Labs: No provider notes.  ?02/17/21 Lynne Leader, MD (Sports Medicine): Left hip pain. Referral to Physical Therapy.  ?02/16/21 Vernard Gambles, MD (Pulmonology): Lung Nodule Referral to Radiation Oncology. Start trial of: Breztri 2 puffs twice daily.  ?02/11/21 NM PET Scan ?01/28/21 Radiology Call Report for LDCT: IMPRESSION: Lung-RADS 4B, suspicious. Additional imaging evaluation or consultation with Pulmonology or Thoracic Surgery recommended. Ordered: NM PET Image Skull Base to Thigh ?01/26/21 Low Dose CT  ?01/15/21 Oncology Infusion - VENOFER 300 mg administered. ? ?Hospital visits:  ?None in previous 6 months ? ?Medications: ?Outpatient Encounter Medications as of 07/13/2021  ?Medication Sig  ? albuterol (VENTOLIN HFA) 108 (90 Base) MCG/ACT inhaler INHALE 2 PUFFS INTO THE LUNGS EVERY 4 HOURS AS NEEDED FOR WHEEZE OR FOR SHORTNESS OF BREATH  ? budesonide-formoterol (SYMBICORT) 160-4.5 MCG/ACT inhaler TAKE 2 PUFFS BY MOUTH TWICE A DAY  ? cholecalciferol (VITAMIN D3) 25 MCG (1000 UNIT) tablet Take 1,000 Units by mouth 2 (two) times daily.  ? fluconazole (DIFLUCAN) 100 MG tablet Take 1 tablet (100 mg total) by mouth daily.  ? furosemide (LASIX) 20 MG tablet TAKE 1 TABLET BY MOUTH EVERY  DAY  ? lactulose (CHRONULAC) 10 GM/15ML  solution Take 15 mLs (10 g total) by mouth 3 (three) times daily.  ? metFORMIN (GLUCOPHAGE-XR) 500 MG 24 hr tablet TAKE 1 TABLET BY MOUTH EVERY DAY WITH BREAKFAST  ? Multiple Vitamin (MULTIVITAMIN) tablet Take 1 tablet by mouth daily.  ? Multiple Vitamins-Minerals (PRESERVISION AREDS 2) CAPS Take 1 capsule by mouth 2 (two) times daily.  ? mupirocin ointment (BACTROBAN) 2 % APPLY 1 APPLICATION TOPICALLY 2 (TWO) TIMES DAILY. TO AFFECTED AREAS  ? nadolol (CORGARD) 20 MG tablet TAKE 1 TABLET BY MOUTH EVERY DAY  ? nystatin (MYCOSTATIN) 100000 UNIT/ML suspension TAKE 5 MLS (500,000 UNITS TOTAL) BY MOUTH 3 (THREE) TIMES DAILY. SWISH AND SWALLOW  ? pantoprazole (PROTONIX) 40 MG tablet TAKE 1 TABLET (40 MG TOTAL) BY MOUTH 2 (TWO) TIMES DAILY BEFORE A MEAL.  ? SYSTANE COMPLETE 0.6 % SOLN Apply 1 drop to eye 2 (two) times daily.  ? traMADol (ULTRAM) 50 MG tablet Take 1 tablet (50 mg total) by mouth every 8 (eight) hours as needed for severe pain.  ? ursodiol (ACTIGALL) 300 MG capsule TAKE 1 CAPSULE BY MOUTH EVERY DAY  ? XIFAXAN 550 MG TABS tablet TAKE 1 TABLET BY MOUTH TWICE A DAY  ? ?No facility-administered encounter medications on file as of 07/13/2021.  ? ?Lab Results  ?Component Value Date/Time  ? HGBA1C 5.7 (A) 06/04/2021 11:09 AM  ? HGBA1C 6.4 11/07/2020 08:14 AM  ? HGBA1C 5.2 05/06/2020 09:03 AM  ? MICROALBUR 0.3 11/02/2019 05:00 PM  ? MICROALBUR <0.7 04/24/2018 10:16 AM  ?  ?BP Readings from Last 3 Encounters:  ?07/08/21 126/60  ?06/29/21 138/80  ?06/24/21 127/65  ? ? ?Patient contacted to confirm telephone appointment with Charlene Brooke, Pharm D, on 07/16/2021 at 1:30. ? ?Do you have any problems getting your medications? No ? ?Have you seen any other providers since your last visit with PCP? Yes ? ?Star Rating Drugs:  ?Medication:  Last Fill: Day Supply ?Metformin 500 mg 06/11/2021 90 ? ?Care Gaps: ?Annual wellness visit in last year? Yes 11/07/2020 ?Most Recent BP reading: 126/60 on 07/08/2021 ? ?If  Diabetic: ?Most recent A1C reading: 5.7 on 06/04/2021 ?Last eye exam / retinopathy screening: 12/24/2019 ?Last diabetic foot exam: 05/12/2020 ? ?Charlene Brooke, CPP notified ? ?Marijean Niemann, RMA ?Clinical Pharmacy Assistant ?[phone] ? ? ? ? ? ?

## 2021-07-13 NOTE — Telephone Encounter (Signed)
Patient notified as instructed by telephone and verbalized understanding. ? ?Patient stated that she has another problem  that she needs to see Dr. Glori Bickers for. Patient stated that she has been having physical therapy. Patient stated that she started with severe left shoulder pain last night. Patient stated that she has seen Dr. Glori Bickers for this previously. Patient stated that the pain  level has been around a level 8. Patient stated that she has not tried the patches that she was given.  Patient denies chest pain, SOB or difficulty breathing. Patient stated that the pain is not associated with her heart. Patient stated that she was told that her shoulder is inflamed. Patient stated that she would prefer to see Dr. Glori Bickers. Patient scheduled for an appointment 07/15/21 at 12:00 with Dr. Glori Bickers. Patient was given ER precautions and she verbalized understanding. Patient stated that she is going to use the patches and take some tylenol. ?

## 2021-07-13 NOTE — Telephone Encounter (Signed)
Aware and agree with ER precautions  ?I will see her then ?

## 2021-07-15 ENCOUNTER — Ambulatory Visit (INDEPENDENT_AMBULATORY_CARE_PROVIDER_SITE_OTHER): Payer: Medicare HMO | Admitting: Family Medicine

## 2021-07-15 ENCOUNTER — Encounter: Payer: Self-pay | Admitting: Family Medicine

## 2021-07-15 ENCOUNTER — Ambulatory Visit (INDEPENDENT_AMBULATORY_CARE_PROVIDER_SITE_OTHER)
Admission: RE | Admit: 2021-07-15 | Discharge: 2021-07-15 | Disposition: A | Discharge status: Home / Self Care | Payer: Medicare HMO | Source: Ambulatory Visit | Attending: Family Medicine | Admitting: Family Medicine

## 2021-07-15 VITALS — BP 118/78 | HR 84 | Temp 97.3°F | Ht 64.5 in | Wt 112.0 lb

## 2021-07-15 DIAGNOSIS — M19012 Primary osteoarthritis, left shoulder: Secondary | ICD-10-CM | POA: Diagnosis not present

## 2021-07-15 DIAGNOSIS — M25512 Pain in left shoulder: Secondary | ICD-10-CM

## 2021-07-15 NOTE — Assessment & Plan Note (Signed)
This may be part of chest wall pain complex ?Rev prev notes, cxr and rib films with pt  ?Agree with continued PT  ?Reassuring exam/no rotator cuff signs ?Shoulder film today  ?lidoderm patch prn ?Tramadol prn  ?Alert if severe pain returns  ?Continue ice/heat prn ?

## 2021-07-15 NOTE — Progress Notes (Signed)
? ?Subjective:  ? ? Patient ID: Christine Barton, female    DOB: [birth year], 71 y.o.   MRN: [id number] ? ?HPI ?Pt presents for L shoulder pain  ? ?Wt Readings from Last 3 Encounters:  ?07/15/21 112 lb (50.8 kg)  ?06/29/21 112 lb (50.8 kg)  ?06/15/21 114 lb (51.7 kg)  ? ?18.93 kg/m? ? ?Sunday night woke up with intense shoulder pain  ?Thought it was from lying on it  ? ? ?Doing PT and helps some (for her chest wall pain) has seen sport med Dr Amalia Hailey also ? ?Overall getting worse  ?Used a few tylenol and a tramadol  ?Also some lidocaine patches  ?Eased off a bit by Monday  ? ?Dull ache returned Tuesday afternoon  ? ?Has had chest wall pain on L in the past  ?Thinks this is linked  ? ?Had cxr and rib xray in feb  ? ?Worse to lift arm up past midline  ?No position helps  ?Both ice and heat work (heat better)  ? ? ?H/o cervical disc dz with osteophytes  ?MRI was in 2008 ? ?Patient Active Problem List  ? Diagnosis Date Noted  ? [place]atston CAC score, >400 07/02/2021  ? Left-sided chest wall pain 06/04/2021  ? Lung cancer ([place]) 02/25/2021  ? Iron deficiency anemia 01/01/2021  ? Iron deficiency anemia due to chronic blood loss 11/06/2020  ? AVM (arteriovenous malformation) of colon   ? Benign neoplasm of colon   ? NASH (nonalcoholic steatohepatitis)   ? Malnutrition ([place]) 07/25/2019  ? Symptomatic anemia 07/25/2019  ? Tobacco abuse 07/25/2019  ? Mass of soft tissue of face 03/06/2019  ? HSV infection 02/23/2019  ? Aortic atherosclerosis ([place]) 01/22/2019  ? Coronary atherosclerosis 01/22/2019  ? Encounter for screening for lung cancer 01/04/2019  ? Venous insufficiency 11/15/2018  ? Esophageal varices without bleeding ([place]) 10/27/2018  ? Anemia 10/23/2018  ? Fatigue 10/23/2018  ? Marasmus ([place]) 07/26/2018  ? Poor balance 05/30/2018  ? Generalized weakness 05/30/2018  ? Cirrhosis of liver ([place]) 04/24/2018  ? Ascites 04/24/2018  ? Gallstones 04/24/2018  ? Heme positive stool 04/24/2018  ? Screening mammogram, encounter for 11/07/2017   ? Smoker 11/07/2017  ? Screening examination for STD (sexually transmitted disease) 11/11/2016  ? Welcome to Medicare preventive visit 10/26/2016  ? Estrogen deficiency 10/26/2016  ? Colon cancer screening 11/01/2014  ? Encounter for routine gynecological examination 09/12/2013  ? Rapid heart beat 10/10/2012  ? Left shoulder pain 08/20/2011  ? Routine general medical examination at a health care facility 07/21/2011  ? Vitamin D deficiency 08/04/2009  ? POSTMENOPAUSAL STATUS 08/04/2009  ? Controlled diabetes mellitus type 2 with complications ([place]) 97/05/6376  ? Prediabetes 02/18/2009  ? Hyperlipidemia associated with type 2 diabetes mellitus ([place]) 06/14/2008  ? [place] DISEASE, CERVICAL 03/30/2007  ? History of alcohol abuse 12/06/2006  ? Neuropathy of both feet 12/06/2006  ? DIVERTICULOSIS, COLON 12/06/2006  ? Fatty liver 12/06/2006  ? BREAST CANCER, HX OF 12/06/2006  ? ?Past Medical History:  ?Diagnosis Date  ? Alcohol abuse, unspecified   ? Breast cancer ([place]) 1998  ? Right  ? Cataract   ? left eye  ? Cervical spondylosis 2006  ? MRI  ? Degeneration of cervical intervertebral disc 2006  ? MRI  ? Diabetes mellitus without complication ([place])   ? Diverticulosis of colon (without mention of hemorrhage)   ? Hyperpotassemia   ? Lung cancer ([place]) 03/2021  ? started radiation in January 2023, right side  ? Microscopic  hematuria   ? Mononeuritis of unspecified site   ? Nonspecific abnormal results of liver function study   ? Other abnormal glucose   ? Other and unspecified hyperlipidemia   ? no per pt  ? Other chronic nonalcoholic liver disease   ? Personal history of chemotherapy   ? Personal history of malignant neoplasm of breast   ? Personal history of radiation therapy   ? Pneumothorax, acute   ? right, spontaneous  ? Tobacco use disorder   ? Unspecified vitamin D deficiency   ? ?Past Surgical History:  ?Procedure Laterality Date  ? BIOPSY  07/27/2019  ? Procedure: BIOPSY;  Surgeon: Yetta Flock, MD;  Location:  [facility];  Service: Gastroenterology;;  ? BREAST BIOPSY  9/03  ? Right  ? BREAST LUMPECTOMY Right 1998  ? CHEST TUBE INSERTION  11/02/2014  ? COLONOSCOPY    ? COLONOSCOPY WITH PROPOFOL N/A 07/27/2019  ? Procedure: COLONOSCOPY WITH PROPOFOL;  Surgeon: Yetta Flock, MD;  Location: [facility];  Service: Gastroenterology;  Laterality: N/A;  ? ESOPHAGOGASTRODUODENOSCOPY (EGD) WITH PROPOFOL N/A 10/30/2018  ? Procedure: ESOPHAGOGASTRODUODENOSCOPY (EGD) WITH PROPOFOL;  Surgeon: Mauri Pole, MD;  Location: [facility];  Service: Endoscopy;  Laterality: N/A;  ? ESOPHAGOGASTRODUODENOSCOPY (EGD) WITH PROPOFOL N/A 03/12/2019  ? Procedure: ESOPHAGOGASTRODUODENOSCOPY (EGD) WITH PROPOFOL;  Surgeon: Mauri Pole, MD;  Location: [facility];  Service: Endoscopy;  Laterality: N/A;  ? ESOPHAGOGASTRODUODENOSCOPY (EGD) WITH PROPOFOL N/A 07/27/2019  ? Procedure: ESOPHAGOGASTRODUODENOSCOPY (EGD) WITH PROPOFOL;  Surgeon: Yetta Flock, MD;  Location: [facility];  Service: Gastroenterology;  Laterality: N/A;  ? EYE SURGERY  02/2017  ? cataract extraction with lens implant-left  ? HOT HEMOSTASIS N/A 07/27/2019  ? Procedure: HOT HEMOSTASIS (ARGON PLASMA COAGULATION/BICAP);  Surgeon: Yetta Flock, MD;  Location: [facility];  Service: Gastroenterology;  Laterality: N/A;  ? MOUTH SURGERY    ? POLYPECTOMY  07/27/2019  ? Procedure: POLYPECTOMY;  Surgeon: Yetta Flock, MD;  Location: [facility];  Service: Gastroenterology;;  ? TUBAL LIGATION    ? ?Social History  ? ?Tobacco Use  ? Smoking status: Every Day  ?  Packs/day: 1.00  ?  Years: 58.00  ?  Pack years: 58.00  ?  Types: Cigarettes  ?  Passive exposure: Past  ? Smokeless tobacco: Never  ? Tobacco comments:  ?  0.5 ppd - 04/14/21  ?Vaping Use  ? Vaping Use: Never used  ?Substance Use Topics  ? Alcohol use: Not Currently  ? Drug use: No  ? ?Family History  ?Problem Relation Age of Onset  ? Heart failure Father   ? Heart attack Father   ? Colon  cancer Maternal Uncle   ? Stroke Mother   ? Esophageal cancer Neg Hx   ? Rectal cancer Neg Hx   ? Stomach cancer Neg Hx   ? Pancreatic cancer Neg Hx   ? ?Allergies  ?Allergen Reactions  ? Oxycodone Nausea And Vomiting  ? Crestor [Rosuvastatin]   ?  Abdominal pain  ?  ? Glipizide Other (See Comments)  ?  Stomach pain  ? Prednisone   ?  GI upset and general malaise   ? ?Current Outpatient Medications on File Prior to Visit  ?Medication Sig Dispense Refill  ? albuterol (VENTOLIN HFA) 108 (90 Base) MCG/ACT inhaler INHALE 2 PUFFS INTO THE LUNGS EVERY 4 HOURS AS NEEDED FOR WHEEZE OR FOR SHORTNESS OF BREATH 18 each 3  ? budesonide-formoterol (SYMBICORT) 160-4.5 MCG/ACT inhaler TAKE 2 PUFFS BY MOUTH  TWICE A DAY 10.2 each 4  ? cholecalciferol (VITAMIN D3) 25 MCG (1000 UNIT) tablet Take 1,000 Units by mouth 2 (two) times daily.    ? fluconazole (DIFLUCAN) 100 MG tablet Take 1 tablet (100 mg total) by mouth daily. 14 tablet 0  ? furosemide (LASIX) 20 MG tablet TAKE 1 TABLET BY MOUTH EVERY DAY 90 tablet 1  ? lactulose (CHRONULAC) 10 GM/15ML solution Take 15 mLs (10 g total) by mouth 3 (three) times daily. 236 mL 11  ? metFORMIN (GLUCOPHAGE-XR) 500 MG 24 hr tablet TAKE 1 TABLET BY MOUTH EVERY DAY WITH BREAKFAST 90 tablet 1  ? Multiple Vitamin (MULTIVITAMIN) tablet Take 1 tablet by mouth daily.    ? Multiple Vitamins-Minerals (PRESERVISION AREDS 2) CAPS Take 1 capsule by mouth 2 (two) times daily.    ? mupirocin ointment (BACTROBAN) 2 % APPLY 1 APPLICATION TOPICALLY 2 (TWO) TIMES DAILY. TO AFFECTED AREAS 22 g 2  ? nadolol (CORGARD) 20 MG tablet TAKE 1 TABLET BY MOUTH EVERY DAY 90 tablet 3  ? nystatin (MYCOSTATIN) 100000 UNIT/ML suspension TAKE 5 MLS (500,000 UNITS TOTAL) BY MOUTH 3 (THREE) TIMES DAILY. SWISH AND SWALLOW 120 mL 0  ? pantoprazole (PROTONIX) 40 MG tablet TAKE 1 TABLET (40 MG TOTAL) BY MOUTH 2 (TWO) TIMES DAILY BEFORE A MEAL. 180 tablet 1  ? SYSTANE COMPLETE 0.6 % SOLN Apply 1 drop to eye 2 (two) times daily.    ?  traMADol (ULTRAM) 50 MG tablet Take 1 tablet (50 mg total) by mouth every 8 (eight) hours as needed for severe pain. 15 tablet 0  ? ursodiol (ACTIGALL) 300 MG capsule TAKE 1 CAPSULE BY MOUTH EVERY DAY 30

## 2021-07-15 NOTE — Patient Instructions (Signed)
Xray of shoulder today  ? ?Keep doing what you are doing  ?Heat/ice and physical therapy  ?Use the pain patches as needed  ? ?Watch for the xray report on mychart  ? ?This may have been a bad muscle spasm - but impossible to tell unless we saw you right then  ? ? ?If symptoms return let us know  ?Take care of yourself  ? ? ? ?

## 2021-07-16 ENCOUNTER — Ambulatory Visit (INDEPENDENT_AMBULATORY_CARE_PROVIDER_SITE_OTHER): Payer: Medicare HMO | Admitting: Pharmacist

## 2021-07-16 DIAGNOSIS — I700 Atherosclerosis of aorta: Secondary | ICD-10-CM

## 2021-07-16 DIAGNOSIS — M25512 Pain in left shoulder: Secondary | ICD-10-CM | POA: Diagnosis not present

## 2021-07-16 DIAGNOSIS — E1169 Type 2 diabetes mellitus with other specified complication: Secondary | ICD-10-CM

## 2021-07-16 DIAGNOSIS — M6281 Muscle weakness (generalized): Secondary | ICD-10-CM | POA: Diagnosis not present

## 2021-07-16 DIAGNOSIS — R0782 Intercostal pain: Secondary | ICD-10-CM | POA: Diagnosis not present

## 2021-07-16 DIAGNOSIS — K7030 Alcoholic cirrhosis of liver without ascites: Secondary | ICD-10-CM

## 2021-07-16 DIAGNOSIS — E118 Type 2 diabetes mellitus with unspecified complications: Secondary | ICD-10-CM

## 2021-07-16 NOTE — Progress Notes (Signed)
? ?Chronic Care Management ?Pharmacy Note ? ?07/16/2021 ?Name:  Christine Barton MRN:  [id number] DOB:  10-10-1950 ? ?Summary: CCM Initial visit ?-Pt endorses compliance with medications as prescribed ?-Pt takes metformin 500 mg/day; A1c 5.7%; hx if cirrhosis, presumed d/t alcoholism. She has questions about safety of metformin with liver disease. ?-Pt has hx of thrush with inhaler use ? ?Recommendations/Changes made from today's visit: ?-Advised metformin benefits > risks in cirrhosis according to recent data ?-Advised to brush teeth after Symbicort use to prevent thrush ? ?Plan: ?-[facility] will call patient 6 months for general update ?-Pharmacist follow up televisit scheduled for 1 year ?-PCP 12/11/21 ? ? ? ?Subjective: ?Christine Barton is an 71 y.o. year old female who is a primary patient of Tower, [name], MD.  The CCM team was consulted for assistance with disease management and care coordination needs.   ? ?Engaged with patient by telephone for follow up visit in response to provider referral for pharmacy case management and/or care coordination services.  ? ?Consent to Services:  ?The patient was given the following information about Chronic Care Management services today, agreed to services, and gave verbal consent: 1. CCM service includes personalized support from designated clinical staff supervised by the primary care provider, including individualized plan of care and coordination with other care providers 2. 24/7 contact phone numbers for assistance for urgent and routine care needs. 3. Service will only be billed when office clinical staff spend 20 minutes or more in a month to coordinate care. 4. Only one practitioner may furnish and bill the service in a calendar month. 5.The patient may stop CCM services at any time (effective at the end of the month) by phone call to the office staff. 6. The patient will be responsible for cost sharing (co-pay) of up to 20% of the service fee (after annual  deductible is met). Patient agreed to services and consent obtained. ? ?Patient Care Team: ?Tower, [name], MD as PCP - General ?Donato Heinz, MD as PCP - Cardiology (Cardiology) ?Mignon Bechler, Cle[place], [facility] as Pharmacist (Pharmacist) ? ?Recent office visits: ?07/15/21 Dr Glori Bickers OV: acute shoulder pain; tramadol PRN. No changes. ? ?06/15/21 Loura Pardon, MD F/U inflammation of chest wall. Stop completed course: Prednisone 10 mg. Patient will try Lido Derm Patch PRN (Patient has patch).  ?06/04/21 Loura Pardon, MD Inflammation of chest wall: Start: Prednisone 10 mg Stop due to patient preference: TRANXENE 3.75 MG and RESTORIL 7.5 MG. Stop due to completed course: Lidocaine 4%.   ? ?Recent consult visits: ?07/09/21 Vernard Gambles, MD (Pulmonology): Telephone: Start: Fluconazole 100 mg. Stopped Spiriva 1 month ago due to recurrent thrush and chest discomfort.  ?07/08/21 Oncology Iron Infusion - VENOFER 300 mg administered. ?07/01/21 Oncology Iron Infusion - VENOFER 300 mg administered. ?06/29/21 Harl Bowie, MD Gertie Fey): Alcoholic Cirrhosis: Abnormal Labs "Blood glucose is elevated at 204, was not a fasting sample.  Otherwise rest of the labs are stable with no significant change.  Potassium is improved, is within normal range".  Stop: FLEXERIL 10 MG, LOPRESSOR 50 MG, ALDACTONE 25 MG and SPIRIVA RESPIMAT 2.5 MCG. ?06/24/21 Oncology Iron Infusion - VENOFER 300 mg administered. ?05/29/21 Oswaldo Milian, MD (Cardiology): Decrease Spironolactone 12.5 mg every other day due to shoulder pain.  ?05/28/21 Curt Bears, MD (Oncology): Telephone: Iron Level still low: Order: Iron Infusion ?05/27/21 Curt Bears, MD (Oncology): Iron deficiency: No med changes.  ?05/26/21 Oswaldo Milian, MD (Cardiology): Abnormal Labs "Troponin negative.  ESR/CRP are elevated, but no  EKG changes to suggest pericarditis. Check echocardiogram to make sure no pericardial effusion.  Potassium mildly elevated, recommend  reducing spironolactone to 12.5 mg daily.  Repeat BMET in 1 week.". Start: Metoprolol 50 mg 2 hrs before CT Scan.  ?05/11/21 Noreene Filbert, MD (Oncology): 1 month fu SBRT. 3 month f/u w/ CT Scan.  ?04/14/21 Vernard Gambles, MD (Pulmonology): COPD: Start: SPIRIVA RESPIMAT 2.5 MCG ?03/2021 - SBRT Treatment ?03/18/21  Oncology Iron Infusion - VENOFER 300 mg administered. ?03/13/21 Harl Bowie, MD (Gastro) Cirrhosis. No med changes.  ?03/11/21 Oncology Iron Infusion - VENOFER 300 mg administered. ?11/22/222 Loralie Champagne - Physical Therapy - Left upper quadrant pain ?02/26/21 Noreene Filbert, MD (Rad Oncology): Lung cancer: Recommended: SBRT Empirically. Patient refused.  ?02/25/21 Cassandra Heilingoetter, PA (Oncology): AVM - Abnormal Labs: No provider notes.  ?02/17/21 Lynne Leader, MD (Sports Medicine): Left hip pain. Referral to Physical Therapy.  ?02/16/21 Vernard Gambles, MD (Pulmonology): Lung Nodule Referral to Radiation Oncology. Start trial of: Breztri 2 puffs twice daily.  ?02/11/21 NM PET Scan ?01/28/21 Radiology Call Report for LDCT: IMPRESSION: Lung-RADS 4B, suspicious. Additional imaging evaluation or consultation with Pulmonology or Thoracic Surgery recommended. Ordered: NM PET Image Skull Base to Thigh ?01/26/21 Low Dose CT  ?01/15/21 Oncology Infusion - VENOFER 300 mg administered. ? ?Hospital visits: ?None in previous 6 months ? ? ?Objective: ? ?Lab Results  ?Component Value Date  ? CREATININE 0.75 06/29/2021  ? BUN 14 06/29/2021  ? GFR 80.59 06/29/2021  ? EGFR 74 05/26/2021  ? GFRNONAA 72 12/11/2019  ? GFRAA 83 12/11/2019  ? NA 135 06/29/2021  ? K 4.6 06/29/2021  ? CALCIUM 10.2 06/29/2021  ? CO2 29 06/29/2021  ? GLUCOSE 204 (H) 06/29/2021  ? ? ?Lab Results  ?Component Value Date/Time  ? HGBA1C 5.7 (A) 06/04/2021 11:09 AM  ? HGBA1C 6.4 11/07/2020 08:14 AM  ? HGBA1C 5.2 05/06/2020 09:03 AM  ? GFR 80.59 06/29/2021 11:56 AM  ? GFR 72.74 11/07/2020 08:14 AM  ? MICROALBUR 0.3 11/02/2019 05:00 PM  ?  MICROALBUR <0.7 04/24/2018 10:16 AM  ?  ?Last diabetic Eye exam:  ?Lab Results  ?Component Value Date/Time  ? HMDIABEYEEXA Retinopathy (A) 12/24/2019 12:00 AM  ?  ?Last diabetic Foot exam: No results found for: HMDIABFOOTEX  ? ?Lab Results  ?Component Value Date  ? CHOL 142 11/07/2020  ? HDL 34.80 (L) 11/07/2020  ? Watson 82 11/07/2020  ? LDLDIRECT 83.0 10/31/2017  ? TRIG 124.0 11/07/2020  ? CHOLHDL 4 11/07/2020  ? ? ? ?  Latest Ref Rng & Units 06/29/2021  ? 11:56 AM 05/26/2021  ? 11:47 AM 11/07/2020  ?  8:14 AM  ?Hepatic Function  ?Total Protein 6.0 - 8.3 g/dL 7.3   7.1   6.8    ?Albumin 3.5 - 5.2 g/dL 4.4   4.4   4.0    ?AST 0 - 37 U/L 17   20   17     ?ALT 0 - 35 U/L 7   8   8     ?Alk Phosphatase 39 - 117 U/L 85   100   77    ?Total Bilirubin 0.2 - 1.2 mg/dL 0.5   0.3   0.6    ? ? ?Lab Results  ?Component Value Date/Time  ? TSH 3.77 11/07/2020 08:14 AM  ? TSH 3.95 11/02/2019 08:31 AM  ? ? ? ?  Latest Ref Rng & Units 06/29/2021  ? 11:56 AM 05/27/2021  ?  9:40 AM 02/25/2021  ?  9:24 AM  ?  CBC  ?WBC 4.0 - 10.5 K/uL 7.8   10.9   5.7    ?Hemoglobin 12.0 - 15.0 g/dL 10.4   10.4   11.1    ?Hematocrit 36.0 - 46.0 % 31.7   32.3   35.4    ?Platelets 150.0 - 400.0 K/uL 234.0   251   145    ? ? ?Lab Results  ?Component Value Date/Time  ? VD25OH 93.19 11/07/2020 08:14 AM  ? VD25OH 64.13 11/02/2019 08:31 AM  ? ? ?Clinical ASCVD: Yes  ?The 10-year ASCVD risk score (Arnett DK, et al., 2019) is: 23.5% ?  Values used to calculate the score: ?    Age: 35 years ?    Sex: Female ?    Is Non-Hispanic African American: No ?    Diabetic: Yes ?    Tobacco smoker: Yes ?    Systolic Blood Pressure: 035 mmHg ?    Is BP treated: No ?    HDL Cholesterol: 34.8 mg/dL ?    Total Cholesterol: 142 mg/dL   ? ? ?  11/07/2020  ? 10:40 AM 11/07/2019  ?  2:05 PM 11/06/2018  ?  9:53 AM  ?Depression screen PHQ 2/9  ?Decreased Interest 0 3 0  ?Down, Depressed, Hopeless 0 3 0  ?PHQ - 2 Score 0 6 0  ?Altered sleeping 0 0 0  ?Tired, decreased energy 0 0 3  ?Change in  appetite 0 0 0  ?Feeling bad or failure about yourself  0 0 0  ?Trouble concentrating 0 0 0  ?Moving slowly or fidgety/restless 0 0 0  ?Suicidal thoughts 0 0 0  ?PHQ-9 Score 0 6 3  ?Difficult doing work/chore

## 2021-07-20 ENCOUNTER — Telehealth: Payer: Self-pay | Admitting: Family Medicine

## 2021-07-20 DIAGNOSIS — M25512 Pain in left shoulder: Secondary | ICD-10-CM

## 2021-07-20 NOTE — Telephone Encounter (Signed)
-----   Message from Elta Guadeloupe, [place] sent at 07/20/2021  3:23 PM EDT ----- ?Notified pt with the x-ray results. Pt agreed to see orthopedic and requesting referral in [place]. ?

## 2021-07-20 NOTE — Telephone Encounter (Signed)
The referral is in  ?Please call us in 1-2 weeks if you don't hear anything  ?

## 2021-07-21 DIAGNOSIS — M25512 Pain in left shoulder: Secondary | ICD-10-CM | POA: Diagnosis not present

## 2021-07-21 DIAGNOSIS — M6281 Muscle weakness (generalized): Secondary | ICD-10-CM | POA: Diagnosis not present

## 2021-07-21 DIAGNOSIS — R0782 Intercostal pain: Secondary | ICD-10-CM | POA: Diagnosis not present

## 2021-07-21 NOTE — Patient Instructions (Signed)
Visit Information ? ?Phone number for Pharmacist: [phone] ? ?Thank you for meeting with me to discuss your medications! I look forward to working with you to achieve your health care goals. Below is a summary of what we talked about during the visit: ? ? Goals Addressed   ?None ?  ? ? ?Patient Care Plan: [place]  ?  ? ?Problem Identified: Hyperlipidemia and Diabetes, Cirrhosis, Lung cancer   ?Priority: High  ?  ? ?Long-Range Goal: Disease mgmt   ?Start Date: 07/16/2021  ?Expected End Date: 07/17/2022  ?This Visit's Progress: On track  ?Priority: High  ?Note:   ?Current Barriers:  ?None identified ? ?Pharmacist Clinical Goal(s):  ?Patient will contact provider office for questions/concerns as evidenced notation of same in electronic health record through collaboration with PharmD and provider.  ? ?Interventions: ?1:1 collaboration with Tower, [name], MD regarding development and update of comprehensive plan of care as evidenced by provider attestation and co-signature ?Inter-disciplinary care team collaboration (see longitudinal plan of care) ?Comprehensive medication review performed; medication list updated in electronic medical record ? ?Hyperlipidemia: (LDL goal < 70) ?-Not ideally controlled - pt did not tolerate statin previously ?-Aortic atherosclerosis ?-Current treatment: ?None ?-Medications previously tried: rosuvastatin  ?-Educated on Cholesterol goals;  ? ?Diabetes (A1c goal <7%) ?-Controlled - A1c 5.7 (05/2021) in prediabetic range;  ?-Current medications: ?Metformin ER 500 mg daily - Appropriate, Effective, Safe, Accessible ?-Medications previously tried: n/a  ?-Educated on A1c and blood sugar goals; metformin safety in cirrhosis has been established in recent studies ?-Counseled to check feet daily and get yearly eye exams ?-Recommended to continue current medication ? ?Cirrhosis of liver  ?-Controlled ?-Hx alcoholism; follows with GI; limits APAP to 2 tablets/day ?-Current treatment   ?Furosemide 20 mg daily - Appropriate, Effective, Safe, Accessible ?Lactulose 10 g TID - takes 1-1.5 TBSP in a day - Appropriate, Effective, Safe, Accessible ?Ursodiol 300 mg daily (gallstones) - Appropriate, Effective, Safe, Accessible ?Xifaxan 550 mg BID - Appropriate, Effective, Safe, Accessible ?-Medications previously tried: n/a ?-Educated on purpose of each medication  ?-Recommended to continue current medication ? ?Esophageal varices (Goal: prevent bleeding) ?-Controlled ?-Current treatment  ?Nadolol 20 mg daily - Appropriate, Effective, Safe, Accessible ?-Medications previously tried: n/a ?-Educated on purpose for nadolol  ?-Recommended to continue current medication ? ?COPD (Goal: manage symptoms) ?-Controlled - pt has hx of thrush with inhaler use ?-Follows with pulmonology ?-Hx Lung cancer (Dx 02/2021) ?-Current treatment  ?Albuterol HFA prn - Appropriate, Effective, Safe, Accessible ?Symbicort 160-4.5 mcg/act 2 puff BID - Appropriate, Effective, Safe, Accessible ?-Medications previously tried: Spiriva ?-Advised to brush teeth after using Symbicort for more thorough rinsing to prevent thrush  ?-Recommended to continue current medication ? ?AVM bleeding (Goal: manage symptoms) ?-Controlled ?-Current treatment  ?Pantoprazole 40 mg daily - Appropriate, Effective, Safe, Accessible ?-Medications previously tried: n/a ?-Educated on purpose for PPI as bleeding precaution, not necessarily GERD  ?-Recommended to continue current medication ? ?Health Maintenance ?-Vaccine gaps: Shingrix ?-Current therapy:  ?Vitamin D 1000 IU BID ?Multivitamin ?Preservision Areds 2 daily ?Tramadol 50 mg PRN ?Vitamin B12 500 mcg  ?-Patient is satisfied with current therapy and denies issues ?-Recommended to continue current medication ? ?Patient Goals/Self-Care Activities ?Patient will:  ?- take medications as prescribed as evidenced by patient report and record review ?focus on medication adherence by routine ? ?  ? ? ?Ms. Karis  was given information about Chronic Care Management services today including:  ?CCM service includes personalized support from designated clinical staff  supervised by her physician, including individualized plan of care and coordination with other care providers ?24/7 contact phone numbers for assistance for urgent and routine care needs. ?Standard insurance, coinsurance, copays and deductibles apply for chronic care management only during months in which we provide at least 20 minutes of these services. Most insurances cover these services at 100%, however patients may be responsible for any copay, coinsurance and/or deductible if applicable. This service may help you avoid the need for more expensive face-to-face services. ?Only one practitioner may furnish and bill the service in a calendar month. ?The patient may stop CCM services at any time (effective at the end of the month) by phone call to the office staff. ? ?Patient agreed to services and verbal consent obtained.  ? ?Patient verbalizes understanding of instructions and care plan provided today and agrees to view in [place]. Active MyChart status confirmed with patient.   ?Telephone follow up appointment with pharmacy team member scheduled for: 1 year ? ?Charlene Brooke, PharmD, BCACP ?Clinical Pharmacist ?[facility] ?[phone]  ?

## 2021-07-23 ENCOUNTER — Ambulatory Visit: Payer: Medicare HMO | Admitting: Pulmonary Disease

## 2021-07-24 ENCOUNTER — Other Ambulatory Visit: Payer: Self-pay | Admitting: Family Medicine

## 2021-07-24 NOTE — Telephone Encounter (Signed)
[name] pharmacist d/c med on 07/16/21 saying she was finished with med.  ? ?Last filled on 04/20/21 #120 mL with 0 refills ?

## 2021-07-25 ENCOUNTER — Other Ambulatory Visit: Payer: Self-pay | Admitting: Family Medicine

## 2021-07-25 NOTE — Telephone Encounter (Signed)
I refilled it  ?Please reach out to her and see how her thrush symptoms are  ?

## 2021-07-26 NOTE — Progress Notes (Unsigned)
Patient ID: Christine Barton                 DOB: 1951/03/18                    MRN: [id number] ? ? ? ? ?HPI: ?Christine Barton is a 71 y.o. female patient referred to lipid clinic by Dr. Gardiner Rhyme. PMH is significant for hx of breast cancer, T2DM, COPD, chronic anemia secondary to occult GI blood loss, and decompensated cirrhosis. Pt had CT calcium scan done on 06/08/21 which showed calcium score of 598 (94th percentile for age, sex, and race) and moderate stenosis. CT FFR showed no significant blockages. Pt stopped taking rosuvastatin, but was told to restart on 5 mg daily after being informed of results. She refused to take the med d/t abdominal pain. She was unsure of other meds she has tried, but will not take a statin and is open to other meds to help lower her cholesterol.  ? ?Pt presented over the phone for lipid clinic. Verified and updated med list with pt. Pt has not tried any other cholesterol medications other than statins. Visit was stopped early d/t pt needing to get off appointment to take another call. Upon further review of chart, pt's daughter called to speak with someone at family medicine clinic and pt stated her mouth was very dry, she had on and off dizziness, and her speech was slightly slurred. The daughter stated this was not pt's normal and she called EMS to take her to the ED. ? ?Current Medications: None ?Intolerances: rosuvastatin - abdominal pain ?Risk Factors: ASCVD (calcium score of 598), smoker (0.5 ppd), DM ?LDL goal: <70 mg/dL ? ?Family History: Mother - CVA. Father - HF, MI ? ?Social History: Denies illicit drug and alcohol use. Has smoked for 58 years. Currently smokes 0.5 ppd. ? ?Past Medical History:  ?Diagnosis Date  ? Alcohol abuse, unspecified   ? Breast cancer ([place]) 1998  ? Right  ? Cataract   ? left eye  ? Cervical spondylosis 2006  ? MRI  ? Degeneration of cervical intervertebral disc 2006  ? MRI  ? Diabetes mellitus without complication ([place])   ? Diverticulosis of colon  (without mention of hemorrhage)   ? Hyperpotassemia   ? Lung cancer ([place]) 03/2021  ? started radiation in January 2023, right side  ? Microscopic hematuria   ? Mononeuritis of unspecified site   ? Nonspecific abnormal results of liver function study   ? Other abnormal glucose   ? Other and unspecified hyperlipidemia   ? no per pt  ? Other chronic nonalcoholic liver disease   ? Personal history of chemotherapy   ? Personal history of malignant neoplasm of breast   ? Personal history of radiation therapy   ? Pneumothorax, acute   ? right, spontaneous  ? Tobacco use disorder   ? Unspecified vitamin D deficiency   ? ? ?Current Outpatient Medications on File Prior to Visit  ?Medication Sig Dispense Refill  ? albuterol (VENTOLIN HFA) 108 (90 Base) MCG/ACT inhaler INHALE 2 PUFFS INTO THE LUNGS EVERY 4 HOURS AS NEEDED FOR WHEEZE OR FOR SHORTNESS OF BREATH 18 each 3  ? budesonide-formoterol (SYMBICORT) 160-4.5 MCG/ACT inhaler TAKE 2 PUFFS BY MOUTH TWICE A DAY 10.2 each 4  ? cholecalciferol (VITAMIN D3) 25 MCG (1000 UNIT) tablet Take 1,000 Units by mouth 2 (two) times daily.    ? fluconazole (DIFLUCAN) 100 MG tablet Take 1 tablet (100 mg total) by  mouth daily. 14 tablet 0  ? furosemide (LASIX) 20 MG tablet TAKE 1 TABLET BY MOUTH EVERY DAY 90 tablet 1  ? lactulose (CHRONULAC) 10 GM/15ML solution Take 15 mLs (10 g total) by mouth 3 (three) times daily. 236 mL 11  ? metFORMIN (GLUCOPHAGE-XR) 500 MG 24 hr tablet TAKE 1 TABLET BY MOUTH EVERY DAY WITH BREAKFAST 90 tablet 1  ? Multiple Vitamin (MULTIVITAMIN) tablet Take 1 tablet by mouth daily.    ? Multiple Vitamins-Minerals (PRESERVISION AREDS 2) CAPS Take 1 capsule by mouth 2 (two) times daily.    ? nadolol (CORGARD) 20 MG tablet TAKE 1 TABLET BY MOUTH EVERY DAY 90 tablet 3  ? nystatin (MYCOSTATIN) 100000 UNIT/ML suspension TAKE 5 MLS (500,000 UNITS TOTAL) BY MOUTH 3 (THREE) TIMES DAILY. SWISH AND SWALLOW 120 mL 0  ? pantoprazole (PROTONIX) 40 MG tablet TAKE 1 TABLET (40 MG  TOTAL) BY MOUTH 2 (TWO) TIMES DAILY BEFORE A MEAL. 180 tablet 1  ? SYSTANE COMPLETE 0.6 % SOLN Apply 1 drop to eye 2 (two) times daily.    ? traMADol (ULTRAM) 50 MG tablet Take 1 tablet (50 mg total) by mouth every 8 (eight) hours as needed for severe pain. 15 tablet 0  ? ursodiol (ACTIGALL) 300 MG capsule TAKE 1 CAPSULE BY MOUTH EVERY DAY 30 capsule 1  ? XIFAXAN 550 MG TABS tablet TAKE 1 TABLET BY MOUTH TWICE A DAY 60 tablet 3  ? ?No current facility-administered medications on file prior to visit.  ? ? ?Allergies  ?Allergen Reactions  ? Oxycodone Nausea And Vomiting  ? Crestor [Rosuvastatin]   ?  Abdominal pain  ?  ? Glipizide Other (See Comments)  ?  Stomach pain  ? Prednisone   ?  GI upset and general malaise   ? ?Assessment/Plan: ? ?1. Hyperlipidemia - Visit ended early d/t pt not feeling well and needing to go to ED. Will reschedule visit with pt once current health problems resolve to discuss lipid management. ? ?Patient seen with Debria Garret, [place] pharmacy student ? ?Ramond Dial, Pharm.D, BCPS, CPP ?[id number] [place], [place] [address]  ?Phone: [phone]; Fax: [phone]  ? ? ? ? ?

## 2021-07-27 ENCOUNTER — Other Ambulatory Visit: Payer: Self-pay

## 2021-07-27 ENCOUNTER — Emergency Department (HOSPITAL_COMMUNITY)
Admission: EM | Admit: 2021-07-27 | Discharge: 2021-07-28 | Disposition: A | Discharge status: Home / Self Care | Payer: Medicare HMO | Attending: Emergency Medicine | Admitting: Emergency Medicine

## 2021-07-27 ENCOUNTER — Telehealth: Payer: Self-pay | Admitting: Family Medicine

## 2021-07-27 ENCOUNTER — Emergency Department (HOSPITAL_COMMUNITY): Payer: Medicare HMO

## 2021-07-27 ENCOUNTER — Encounter (HOSPITAL_COMMUNITY): Payer: Self-pay | Admitting: Emergency Medicine

## 2021-07-27 ENCOUNTER — Ambulatory Visit: Payer: Medicare HMO | Admitting: Pharmacist

## 2021-07-27 DIAGNOSIS — I959 Hypotension, unspecified: Secondary | ICD-10-CM | POA: Diagnosis not present

## 2021-07-27 DIAGNOSIS — R001 Bradycardia, unspecified: Secondary | ICD-10-CM | POA: Diagnosis not present

## 2021-07-27 DIAGNOSIS — E860 Dehydration: Secondary | ICD-10-CM | POA: Insufficient documentation

## 2021-07-27 DIAGNOSIS — R531 Weakness: Secondary | ICD-10-CM | POA: Diagnosis not present

## 2021-07-27 DIAGNOSIS — S8002XA Contusion of left knee, initial encounter: Secondary | ICD-10-CM | POA: Diagnosis not present

## 2021-07-27 DIAGNOSIS — S8001XA Contusion of right knee, initial encounter: Secondary | ICD-10-CM | POA: Diagnosis not present

## 2021-07-27 DIAGNOSIS — W19XXXA Unspecified fall, initial encounter: Secondary | ICD-10-CM | POA: Diagnosis not present

## 2021-07-27 DIAGNOSIS — R410 Disorientation, unspecified: Secondary | ICD-10-CM | POA: Diagnosis not present

## 2021-07-27 DIAGNOSIS — I10 Essential (primary) hypertension: Secondary | ICD-10-CM | POA: Diagnosis not present

## 2021-07-27 DIAGNOSIS — N390 Urinary tract infection, site not specified: Secondary | ICD-10-CM | POA: Insufficient documentation

## 2021-07-27 DIAGNOSIS — R0902 Hypoxemia: Secondary | ICD-10-CM | POA: Diagnosis not present

## 2021-07-27 DIAGNOSIS — Z79899 Other long term (current) drug therapy: Secondary | ICD-10-CM | POA: Insufficient documentation

## 2021-07-27 LAB — COMPREHENSIVE METABOLIC PANEL
ALT: 9 U/L (ref 0–44)
AST: 23 U/L (ref 15–41)
Albumin: 3.4 g/dL — ABNORMAL LOW (ref 3.5–5.0)
Alkaline Phosphatase: 75 U/L (ref 38–126)
Anion gap: 8 (ref 5–15)
BUN: 12 mg/dL (ref 8–23)
CO2: 25 mmol/L (ref 22–32)
Calcium: 9.3 mg/dL (ref 8.9–10.3)
Chloride: 102 mmol/L (ref 98–111)
Creatinine, Ser: 0.55 mg/dL (ref 0.44–1.00)
GFR, Estimated: >60 mL/min (ref >60)
Glucose, Bld: 96 mg/dL (ref 70–99)
Potassium: 4 mmol/L (ref 3.5–5.1)
Sodium: 135 mmol/L (ref 135–145)
Total Bilirubin: 0.7 mg/dL (ref 0.3–1.2)
Total Protein: 6.7 g/dL (ref 6.5–8.1)

## 2021-07-27 LAB — CBC
HCT: 33.5 % — ABNORMAL LOW (ref 36.0–46.0)
Hemoglobin: 10.2 g/dL — ABNORMAL LOW (ref 12.0–15.0)
MCH: 28.8 pg (ref 26.0–34.0)
MCHC: 30.4 g/dL (ref 30.0–36.0)
MCV: 94.6 fL (ref 80.0–100.0)
Platelets: 152 10*3/uL (ref 150–400)
RBC: 3.54 MIL/uL — ABNORMAL LOW (ref 3.87–5.11)
RDW: 20.9 % — ABNORMAL HIGH (ref 11.5–15.5)
WBC: 5.3 10*3/uL (ref 4.0–10.5)
nRBC: 0 % (ref 0.0–0.2)

## 2021-07-27 LAB — URINALYSIS, ROUTINE W REFLEX MICROSCOPIC
Bilirubin Urine: NEGATIVE
Glucose, UA: NEGATIVE mg/dL
Hgb urine dipstick: NEGATIVE
Ketones, ur: NEGATIVE mg/dL
Nitrite: NEGATIVE
Protein, ur: NEGATIVE mg/dL
Specific Gravity, Urine: 1.004 — ABNORMAL LOW (ref 1.005–1.030)
pH: 7 (ref 5.0–8.0)

## 2021-07-27 MED ORDER — SODIUM CHLORIDE 0.9% FLUSH: 3.0000 mL | Freq: Once | INTRAVENOUS | Status: DC

## 2021-07-27 NOTE — Telephone Encounter (Signed)
I spoke with Christine Barton who put pt on speaker phone and Pt said her mouth is very dry and pt having dizziness on and off. No abd pain. Pt speech is slightly slurred and Christine Barton said that is not pts normal. Pt is weak and difficulty in walking. Advised pt she needs to go to ED for eval and possible imaging and pt may need IV fluids. Pt said could she go to [facility] and I said yes she can go to [facility] ED. Christine Barton said she would call EMS to take pt to ED now. Sending note to Dr Glori Bickers and [place] CMA. ?

## 2021-07-27 NOTE — ED Provider Triage Note (Signed)
Emergency Medicine Provider Triage Evaluation Note ? ?Christine Barton , a 71 y.o. female  was evaluated in triage.  Pt complains of generalized weakness that started this AM. Per EMS pt fell during lunch with abrasion to her R knee. The fall was unwitnessed however daughter called EMS because pt seemed "off." Pt denies falling. States she was trying to get out of bed and slid down. Denies head injury. She has abrasion to R knee however states it is old despite it looking new. She states her tetanus is UTD.  ? ?Review of Systems  ?Positive: + generalized weakness, fall?, knee pain? ?Negative:  ? ?Physical Exam  ?BP 125/63 (BP Location: Right Arm)   Pulse 84   Temp 97.6 ?F (36.4 ?C)   Resp 18   LMP  (LMP Unknown)   SpO2 100%  ?Gen:   Awake, no distress   ?Resp:  Normal effort  ?MSK:   Moves extremities without difficulty  ?Other:  Alert and oriented to person, place, and time. Believes Obama is the president. Small skin tear to R knee with mild TTP. Abrasion noted to L knee with TTP. No signs of head trauma.  ? ?Medical Decision Making  ?Medically screening exam initiated at 3:50 PM.  Appropriate orders placed.  Christine Barton was informed that the remainder of the evaluation will be completed by another provider, this initial triage assessment does not replace that evaluation, and the importance of remaining in the ED until their evaluation is complete. ? ? ?  ?Eustaquio Maize, PA-C ?07/27/21 1552 ? ?

## 2021-07-27 NOTE — Telephone Encounter (Signed)
Will route to PCP and also Triage to see if PCP needs pt to be triage  ?

## 2021-07-27 NOTE — Telephone Encounter (Signed)
Thanks for triaging, I do advise she goes to the ER (will likely need fluids at the very least) ?I will watch out for correspondence  ?

## 2021-07-27 NOTE — Telephone Encounter (Signed)
Please see separate phone note, pt in ER  ?

## 2021-07-27 NOTE — ED Triage Notes (Addendum)
Pt to triage via [facility] from home.  Reports generalized weakness that started today.  Pt fell around lunch time with abrasion to R knee.  Unwitnessed fall- daughter found her in kitchen.  Doesn't remember fall.  No history of dementia.  Daughter states she seems "off".  No blood thinners. ? ?BP- 96/ palpated ?IV- 20g RFA ?NS 500cc repeat BP 116/ ?CBG 150 ? ?Pt states that she didn't fall and that she previously had a knee injury. ? ?

## 2021-07-27 NOTE — Telephone Encounter (Signed)
Agree with that advisement, looks like she is in the ER now  ? ?

## 2021-07-27 NOTE — Telephone Encounter (Signed)
Pt daughter called stating that the ENT is there now and stating that pt needs to go to the ER. Pt daughter states that pt is dehydrated and BP is low and pt is weak and can't walk. Pt daughter states that pt is refusing to go, pt daughter is asking if you would call and advise pt to go. Please advise. ?

## 2021-07-28 MED ORDER — CEPHALEXIN 500 MG PO CAPS: 500.0000 mg | ORAL_CAPSULE | Freq: Four times a day (QID) | ORAL | 0 refills | Status: DC

## 2021-07-28 MED ORDER — SODIUM CHLORIDE 0.9 % IV SOLN
2.0000 g | Freq: Once | INTRAVENOUS | Status: AC
  Administered 2021-07-28: 2 g via INTRAVENOUS
  Filled 2021-07-28: qty 20

## 2021-07-28 MED ORDER — ACETAMINOPHEN 500 MG PO TABS
1000.0000 mg | ORAL_TABLET | Freq: Once | ORAL | Status: AC
  Administered 2021-07-28: 1000 mg via ORAL
  Filled 2021-07-28: qty 2

## 2021-07-28 MED ORDER — SODIUM CHLORIDE 0.9 % IV BOLUS
500.0000 mL | Freq: Once | INTRAVENOUS | Status: AC
  Administered 2021-07-28: 500 mL via INTRAVENOUS

## 2021-07-28 NOTE — ED Provider Notes (Signed)
?[facility] ?Provider Note ? ? ?CSN: [id number] ?Arrival date & time: 07/27/21  1539 ? ?  ? ?History ? ?Chief Complaint  ?Patient presents with  ? Weakness  ? ? ?Christine Barton is a 71 y.o. female. ? ?Patient had an episode today when she tried to get out of bed slid down onto the floor.  Not really a fall but was not able to get up.  Called her daughter for help.  On daughter's arrival patient was too weak to go to the floor.  EMS gave her some fluids and she seems to have improved some with that and she has more fluids when she got here is improving further.  She complains of being cold but has been afebrile. ? ? ?Weakness ?Severity:  Moderate ?Onset quality:  Gradual ?Duration:  1 day ?Timing:  Constant ?Progression:  Improving ?Chronicity:  New ?Relieved by:  None tried ?Worsened by:  Nothing ?Ineffective treatments:  None tried ?Associated symptoms: falls   ?Associated symptoms: no abdominal pain and no dysuria   ? ?  ? ?Home Medications ?Prior to Admission medications   ?Medication Sig Start Date End Date Taking? Authorizing Provider  ?cephALEXin (KEFLEX) 500 MG capsule Take 1 capsule (500 mg total) by mouth 4 (four) times daily. 07/28/21  Yes Cyrene Gharibian, Corene Cornea, MD  ?albuterol (VENTOLIN HFA) 108 (90 Base) MCG/ACT inhaler INHALE 2 PUFFS INTO THE LUNGS EVERY 4 HOURS AS NEEDED FOR WHEEZE OR FOR SHORTNESS OF BREATH 07/27/21   Tower, [name], MD  ?cholecalciferol (VITAMIN D3) 25 MCG (1000 UNIT) tablet Take 1,000 Units by mouth 2 (two) times daily.    [provider]  ?furosemide (LASIX) 20 MG tablet TAKE 1 TABLET BY MOUTH EVERY DAY 03/09/21   Mauri Pole, MD  ?lactulose (CHRONULAC) 10 GM/15ML solution Take 15 mLs (10 g total) by mouth 3 (three) times daily. 06/28/18   Mauri Pole, MD  ?metFORMIN (GLUCOPHAGE-XR) 500 MG 24 hr tablet TAKE 1 TABLET BY MOUTH EVERY DAY WITH BREAKFAST 01/20/21   Tower, [name], MD  ?Multiple Vitamin (MULTIVITAMIN) tablet Take 1  tablet by mouth daily.    [provider]  ?Multiple Vitamins-Minerals (PRESERVISION AREDS 2) CAPS Take 1 capsule by mouth 2 (two) times daily. 11/16/19   [provider]  ?nadolol (CORGARD) 20 MG tablet TAKE 1 TABLET BY MOUTH EVERY DAY 08/18/20   Mauri Pole, MD  ?nystatin (MYCOSTATIN) 100000 UNIT/ML suspension TAKE 5 MLS (500,000 UNITS TOTAL) BY MOUTH 3 (THREE) TIMES DAILY. SWISH AND SWALLOW 07/25/21   Tower, [name], MD  ?pantoprazole (PROTONIX) 40 MG tablet TAKE 1 TABLET (40 MG TOTAL) BY MOUTH 2 (TWO) TIMES DAILY BEFORE A MEAL. 03/12/21   Mauri Pole, MD  ?SYSTANE COMPLETE 0.6 % SOLN Apply 1 drop to eye 2 (two) times daily. 11/16/19   [provider]  ?traMADol (ULTRAM) 50 MG tablet Take 1 tablet (50 mg total) by mouth every 8 (eight) hours as needed for severe pain. 06/15/21   Tower, [name], MD  ?ursodiol (ACTIGALL) 300 MG capsule TAKE 1 CAPSULE BY MOUTH EVERY DAY 06/17/21   Mauri Pole, MD  ?XIFAXAN 550 MG TABS tablet TAKE 1 TABLET BY MOUTH TWICE A DAY 06/17/21   Mauri Pole, MD  ?   ? ?Allergies    ?Oxycodone, Crestor [rosuvastatin], Glipizide, and Prednisone   ? ?Review of Systems   ?Review of Systems  ?Gastrointestinal:  Negative for abdominal pain.  ?Genitourinary:  Negative for dysuria.  ?Musculoskeletal:  Positive for falls.  ?Neurological:  Positive for weakness.  ? ?Physical Exam ?Updated Vital Signs ?BP (!) 108/58 (BP Location: Left Arm)   Pulse 79   Temp 97.7 ?F (36.5 ?C) (Oral)   Resp 20   LMP  (LMP Unknown)   SpO2 97%  ?Physical Exam ?Vitals and nursing note reviewed.  ?Constitutional:   ?   Appearance: She is well-developed.  ?HENT:  ?   Head: Normocephalic and atraumatic.  ?   Mouth/Throat:  ?   Mouth: Mucous membranes are dry.  ?Eyes:  ?   Pupils: Pupils are equal, round, and reactive to light.  ?Cardiovascular:  ?   Rate and Rhythm: Normal rate and regular rhythm.  ?Pulmonary:  ?   Effort: No respiratory distress.  ?   Breath sounds: No  stridor.  ?Abdominal:  ?   General: Abdomen is flat. There is no distension.  ?Musculoskeletal:     ?   General: No swelling or tenderness. Normal range of motion.  ?   Cervical back: Normal range of motion.  ?Skin: ?   General: Skin is warm and dry.  ?Neurological:  ?   General: No focal deficit present.  ?   Mental Status: She is alert.  ? ? ?ED Results / Procedures / Treatments   ?Labs ?(all labs ordered are listed, but only abnormal results are displayed) ?Labs Reviewed  ?CBC - Abnormal; Notable for the following components:  ?    Result Value  ? RBC 3.54 (*)   ? Hemoglobin 10.2 (*)   ? HCT 33.5 (*)   ? RDW 20.9 (*)   ? All other components within normal limits  ?URINALYSIS, ROUTINE W REFLEX MICROSCOPIC - Abnormal; Notable for the following components:  ? APPearance HAZY (*)   ? Specific Gravity, Urine 1.004 (*)   ? Leukocytes,Ua SMALL (*)   ? Bacteria, UA MANY (*)   ? All other components within normal limits  ?COMPREHENSIVE METABOLIC PANEL - Abnormal; Notable for the following components:  ? Albumin 3.4 (*)   ? All other components within normal limits  ?URINE CULTURE  ? ? ?EKG ?None ? ?Radiology ?CT HEAD WO CONTRAST (5MM) ? ?Result Date: 07/27/2021 ?CLINICAL DATA:  gen weakness. fall. pt confused. rule out head trauma EXAM: CT HEAD WITHOUT CONTRAST TECHNIQUE: Contiguous axial images were obtained from the base of the skull through the vertex without intravenous contrast. RADIATION DOSE REDUCTION: This exam was performed according to the departmental dose-optimization program which includes automated exposure control, adjustment of the mA and/or kV according to patient size and/or use of iterative reconstruction technique. COMPARISON:  None. FINDINGS: Brain: No evidence of acute large vascular territory infarction, hemorrhage, hydrocephalus, extra-axial collection or mass lesion/mass effect. Mild for age patchy white matter hypoattenuation, nonspecific but compatible with chronic microvascular ischemic disease.  Vascular: No hyperdense vessel identified. Calcific intracranial atherosclerosis. Skull: No the no acute fracture. Sinuses/Orbits: Clear sinuses.  No acute orbital findings. Other: No mastoid effusions. IMPRESSION: No evidence of acute intracranial abnormality. Electronically Signed   By: Margaretha Sheffield M.D.   On: 07/27/2021 16:38  ? ?DG Knee Complete 4 Views Left ? ?Result Date: 07/27/2021 ?CLINICAL DATA:  Multiple falls, right suprapatellar abrasion, left infrapatellar bruising EXAM: RIGHT KNEE - COMPLETE 4+ VIEW; LEFT KNEE - COMPLETE 4+ VIEW COMPARISON:  02/16/2017 FINDINGS: Left knee: Frontal, bilateral oblique, lateral views are obtained. No acute displaced fracture, subluxation, or dislocation. Minimal medial compartmental joint space narrowing. No  joint effusion. Significant prepatellar and infrapatellar subcutaneous edema consistent with direct trauma. Atherosclerosis. Right knee: Frontal, bilateral oblique, lateral views are obtained. No acute fracture, subluxation, or dislocation. Joint spaces are well preserved. No joint effusion. Soft tissues are unremarkable. Atherosclerosis. IMPRESSION: 1. No acute displaced fracture within either knee. 2. Subcutaneous edema within the left prepatellar and infrapatellar regions, consistent with direct trauma. 3. Mild left knee osteoarthritis. Electronically Signed   By: Randa Ngo M.D.   On: 07/27/2021 16:36  ? ?DG Knee Complete 4 Views Right ? ?Result Date: 07/27/2021 ?CLINICAL DATA:  Multiple falls, right suprapatellar abrasion, left infrapatellar bruising EXAM: RIGHT KNEE - COMPLETE 4+ VIEW; LEFT KNEE - COMPLETE 4+ VIEW COMPARISON:  02/16/2017 FINDINGS: Left knee: Frontal, bilateral oblique, lateral views are obtained. No acute displaced fracture, subluxation, or dislocation. Minimal medial compartmental joint space narrowing. No joint effusion. Significant prepatellar and infrapatellar subcutaneous edema consistent with direct trauma. Atherosclerosis. Right  knee: Frontal, bilateral oblique, lateral views are obtained. No acute fracture, subluxation, or dislocation. Joint spaces are well preserved. No joint effusion. Soft tissues are unremarkable. Atherosclerosis. IMPRESS

## 2021-07-28 NOTE — ED Notes (Signed)
Patient ambulated through hall and to bathroom with stand-by assist. Patient states "I feel normal. I feel good to walk" ?

## 2021-07-29 LAB — URINE CULTURE: Culture: <10000 — AB

## 2021-08-03 ENCOUNTER — Ambulatory Visit
Admission: RE | Admit: 2021-08-03 | Discharge: 2021-08-03 | Disposition: A | Discharge status: Home / Self Care | Payer: Medicare HMO | Source: Ambulatory Visit | Attending: Radiation Oncology | Admitting: Radiation Oncology

## 2021-08-03 ENCOUNTER — Telehealth: Payer: Self-pay | Admitting: Pulmonary Disease

## 2021-08-03 ENCOUNTER — Telehealth: Payer: Self-pay | Admitting: *Deleted

## 2021-08-03 DIAGNOSIS — R0782 Intercostal pain: Secondary | ICD-10-CM | POA: Diagnosis not present

## 2021-08-03 DIAGNOSIS — M25512 Pain in left shoulder: Secondary | ICD-10-CM | POA: Diagnosis not present

## 2021-08-03 DIAGNOSIS — C3490 Malignant neoplasm of unspecified part of unspecified bronchus or lung: Secondary | ICD-10-CM | POA: Diagnosis not present

## 2021-08-03 DIAGNOSIS — M19012 Primary osteoarthritis, left shoulder: Secondary | ICD-10-CM | POA: Diagnosis not present

## 2021-08-03 DIAGNOSIS — R911 Solitary pulmonary nodule: Secondary | ICD-10-CM | POA: Diagnosis not present

## 2021-08-03 DIAGNOSIS — M6281 Muscle weakness (generalized): Secondary | ICD-10-CM | POA: Diagnosis not present

## 2021-08-03 MED ORDER — IOHEXOL 300 MG/ML  SOLN
75.0000 mL | Freq: Once | INTRAMUSCULAR | Status: AC | PRN
  Administered 2021-08-03: 75 mL via INTRAVENOUS

## 2021-08-03 NOTE — Telephone Encounter (Signed)
Pt states she missed a call about 5 minutes ago, she would like to know the results of her chest ct if they are available and speak to the nurse.  ?

## 2021-08-03 NOTE — Telephone Encounter (Signed)
We didn't call or order the CT but will route to PCP for review  ?

## 2021-08-03 NOTE — Telephone Encounter (Signed)
According to our records, Dr. Baruch Gouty ordered CT. Recommended that she contact Dr. Olena Leatherwood office for results. She voiced her understanding.  ?Nothing further needed.  ? ?

## 2021-08-03 NOTE — Telephone Encounter (Signed)
Patient asking for Dr Baruch Gouty to go over CT results with her.  I see that she has an appointment with Dr Patsey Berthold 4/27, Dr Baruch Gouty 08/10/21. ? ?IMPRESSION: ?1. Interval development of a plaque-like subpleural region of mixed ?attenuation airspace consolidation posterior right upper lobe. ?Imaging features may be radiation induced scarring although ?atelectasis or infection/inflammation not excluded. Follow-up CT ?chest in 3 months recommended to re-evaluate. ?2. Cirrhosis. ?3. Aortic Atherosclerosis (ICD10-I70.0) and Emphysema (ICD10-J43.9). ?  ?  ?Electronically Signed ?  By: Misty Stanley M.D. ?  On: 08/03/2021 10:23 ?

## 2021-08-04 ENCOUNTER — Encounter: Payer: Self-pay | Admitting: Family Medicine

## 2021-08-04 ENCOUNTER — Ambulatory Visit (INDEPENDENT_AMBULATORY_CARE_PROVIDER_SITE_OTHER): Payer: Medicare HMO | Admitting: Family Medicine

## 2021-08-04 VITALS — BP 122/68 | HR 59 | Temp 97.1°F | Ht 64.5 in | Wt 113.4 lb

## 2021-08-04 DIAGNOSIS — F17200 Nicotine dependence, unspecified, uncomplicated: Secondary | ICD-10-CM

## 2021-08-04 DIAGNOSIS — M25512 Pain in left shoulder: Secondary | ICD-10-CM

## 2021-08-04 DIAGNOSIS — N3000 Acute cystitis without hematuria: Secondary | ICD-10-CM | POA: Diagnosis not present

## 2021-08-04 DIAGNOSIS — N390 Urinary tract infection, site not specified: Secondary | ICD-10-CM | POA: Insufficient documentation

## 2021-08-04 DIAGNOSIS — E860 Dehydration: Secondary | ICD-10-CM

## 2021-08-04 LAB — POC URINALSYSI DIPSTICK (AUTOMATED)
Bilirubin, UA: NEGATIVE
Blood, UA: NEGATIVE
Glucose, UA: NEGATIVE
Ketones, UA: NEGATIVE
Leukocytes, UA: NEGATIVE
Nitrite, UA: NEGATIVE
Protein, UA: POSITIVE — AB
Spec Grav, UA: 1.01 (ref 1.010–1.025)
Urobilinogen, UA: 0.2 E.U./dL
pH, UA: 6 (ref 5.0–8.0)

## 2021-08-04 MED ORDER — CYCLOBENZAPRINE HCL 5 MG PO TABS: 5.0000 mg | ORAL_TABLET | Freq: Every day | ORAL | 0 refills | Status: DC

## 2021-08-04 NOTE — Assessment & Plan Note (Signed)
Acute on chronic /became severe last weekend  ?Now improved after visit to ortho [facility] and injection by Dr Harlow Mares  ?Dx is OA ?Much improved ?Planning f/u with ortho in 2-3 mo ?

## 2021-08-04 NOTE — Assessment & Plan Note (Signed)
Clinically resolved ?Cx showed insig growth  ?Finished keflex ?Reviewed hospital records, lab results and studies in detail   ?Reassuring exam and nl vitals now ?ua with trace protein only-improved ?inst to keep working on fluid intake  ?

## 2021-08-04 NOTE — Patient Instructions (Addendum)
Keep drinking fluids  ?I'm glad your shoulder is feeling better  ? ? ?Urine looks better today  ?If you develop urinary symptoms let us know  ? ? ?

## 2021-08-04 NOTE — Assessment & Plan Note (Signed)
Disc in detail risks of smoking and possible outcomes including copd, vascular/ heart disease, cancer , respiratory and sinus infections  ?Pt voices understanding ?In setting of lung cancer ?Still not able to quit/or ready  ?Rev recent CT lungs ?

## 2021-08-04 NOTE — Assessment & Plan Note (Signed)
ER visit on 4/17 for this and uti  ?Reviewed hospital records, lab results and studies in detail  ?Much clinical imp with IVF and oral fluids and keflex  ?Will continue to monitor  ?

## 2021-08-04 NOTE — Progress Notes (Signed)
? ?Subjective:  ? ? Patient ID: Christine Barton, female    DOB: [birth year], 71 y.o.   MRN: [id number] ? ?HPI ?Pt presents for ER f/u , uti  ? ?Wt Readings from Last 3 Encounters:  ?08/04/21 113 lb 6 oz (51.4 kg)  ?07/15/21 112 lb (50.8 kg)  ?06/29/21 112 lb (50.8 kg)  ? ?19.16 kg/m? ? ?Last Sunday her L shoulder pain was much worse/up all night  ?[facility]- Dr Christine Barton after hours  ?Put a shot in shoulder and it helped tremendously ! ?He was told arthritis is the cause if her pain  ?Will f/u in 2-3 months  ? ?Went back to PT yesterday after missing a week  ?So much better  ? ?Had a pharmacy visit with Ria Comment and that was helpful  ? ? ?She presented on 4/17 to ED  with weakness ?Could not get out of bed that day, slid onto the floor  ?Did hit her knees with a fall  ? ? ?EMS gave her some fluids  ?Was afebrile  ?Positive ua with many bacterial and 6-10 wbc ? ?Lab Results  ?Component Value Date  ? CREATININE 0.55 07/27/2021  ? BUN 12 07/27/2021  ? NA 135 07/27/2021  ? K 4.0 07/27/2021  ? CL 102 07/27/2021  ? CO2 25 07/27/2021  ? ?Lab Results  ?Component Value Date  ? WBC 5.3 07/27/2021  ? HGB 10.2 (L) 07/27/2021  ? HCT 33.5 (L) 07/27/2021  ? MCV 94.6 07/27/2021  ? PLT 152 07/27/2021  ? ?CT pf the head showed no acute cranial abn ? ?Knee films-no fractures  ? ?Tx for uti with rocepin 2 g ?Also fluids (her bp were borderline low) and she was able to drink fluid orally  ?No evid of sepsis and able to ambulate before leaving  ?Discharged with keflex 500 mg qid  ? ?Urine culture noted insignificant growth  ? ?Results for orders placed or performed in visit on 08/04/21  ?POCT Urinalysis Dipstick (Automated)  ?Result Value Ref Range  ? Color, UA Yellow   ? Clarity, UA Clear   ? Glucose, UA Negative Negative  ? Bilirubin, UA Negative   ? Ketones, UA Negative   ? Spec Grav, UA 1.010 1.010 - 1.025  ? Blood, UA Negative   ? pH, UA 6.0 5.0 - 8.0  ? Protein, UA Positive (A) Negative  ? Urobilinogen, UA 0.2 0.2 or 1.0  E.U./dL  ? Nitrite, UA Negative   ? Leukocytes, UA Negative Negative  ?  ?No burning to urinate  ? ?BP Readings from Last 3 Encounters:  ?08/04/21 122/68  ?07/28/21 (!) 108/58  ?07/15/21 118/78  ? ?Pulse Readings from Last 3 Encounters:  ?08/04/21 (!) 59  ?07/28/21 79  ?07/15/21 84  ? ? ?Feeling good  ?Is drinking fluids  ?Can put flavor in water and she does much better with it  ?Thinks she got dehydrated because of her shoulder pain  ? ?Had her CT chest recently and will f/u in 3 months  ? ?Patient Active Problem List  ? Diagnosis Date Noted  ? UTI (urinary tract infection) 08/04/2021  ? Dehydration 08/04/2021  ? Agatston CAC score, >400 07/02/2021  ? Left-sided chest wall pain 06/04/2021  ? Lung cancer ([place]) 02/25/2021  ? Iron deficiency anemia 01/01/2021  ? Iron deficiency anemia due to chronic blood loss 11/06/2020  ? AVM (arteriovenous malformation) of colon   ? Benign neoplasm of colon   ? NASH (nonalcoholic steatohepatitis)   ?  Malnutrition ([place]) 07/25/2019  ? Symptomatic anemia 07/25/2019  ? Tobacco abuse 07/25/2019  ? Mass of soft tissue of face 03/06/2019  ? HSV infection 02/23/2019  ? Aortic atherosclerosis ([place]) 01/22/2019  ? Coronary atherosclerosis 01/22/2019  ? Encounter for screening for lung cancer 01/04/2019  ? Venous insufficiency 11/15/2018  ? Esophageal varices without bleeding ([place]) 10/27/2018  ? Anemia 10/23/2018  ? Fatigue 10/23/2018  ? Marasmus ([place]) 07/26/2018  ? Poor balance 05/30/2018  ? Cirrhosis of liver ([place]) 04/24/2018  ? Ascites 04/24/2018  ? Gallstones 04/24/2018  ? Heme positive stool 04/24/2018  ? Screening mammogram, encounter for 11/07/2017  ? Smoker 11/07/2017  ? Screening examination for STD (sexually transmitted disease) 11/11/2016  ? Welcome to Medicare preventive visit 10/26/2016  ? Estrogen deficiency 10/26/2016  ? Colon cancer screening 11/01/2014  ? Encounter for routine gynecological examination 09/12/2013  ? Rapid heart beat 10/10/2012  ? Left shoulder pain 08/20/2011   ? Routine general medical examination at a health care facility 07/21/2011  ? Vitamin D deficiency 08/04/2009  ? POSTMENOPAUSAL STATUS 08/04/2009  ? Controlled diabetes mellitus type 2 with complications ([place]) 35/32/9924  ? Prediabetes 02/18/2009  ? Hyperlipidemia associated with type 2 diabetes mellitus ([place]) 06/14/2008  ? [place] DISEASE, CERVICAL 03/30/2007  ? History of alcohol abuse 12/06/2006  ? Neuropathy of both feet 12/06/2006  ? DIVERTICULOSIS, COLON 12/06/2006  ? Fatty liver 12/06/2006  ? BREAST CANCER, HX OF 12/06/2006  ? ?Past Medical History:  ?Diagnosis Date  ? Alcohol abuse, unspecified   ? Breast cancer ([place]) 1998  ? Right  ? Cataract   ? left eye  ? Cervical spondylosis 2006  ? MRI  ? Degeneration of cervical intervertebral disc 2006  ? MRI  ? Diabetes mellitus without complication ([place])   ? Diverticulosis of colon (without mention of hemorrhage)   ? Hyperpotassemia   ? Lung cancer ([place]) 03/2021  ? started radiation in January 2023, right side  ? Microscopic hematuria   ? Mononeuritis of unspecified site   ? Nonspecific abnormal results of liver function study   ? Other abnormal glucose   ? Other and unspecified hyperlipidemia   ? no per pt  ? Other chronic nonalcoholic liver disease   ? Personal history of chemotherapy   ? Personal history of malignant neoplasm of breast   ? Personal history of radiation therapy   ? Pneumothorax, acute   ? right, spontaneous  ? Tobacco use disorder   ? Unspecified vitamin D deficiency   ? ?Past Surgical History:  ?Procedure Laterality Date  ? BIOPSY  07/27/2019  ? Procedure: BIOPSY;  Surgeon: Yetta Flock, MD;  Location: [facility];  Service: Gastroenterology;;  ? BREAST BIOPSY  9/03  ? Right  ? BREAST LUMPECTOMY Right 1998  ? CHEST TUBE INSERTION  11/02/2014  ? COLONOSCOPY    ? COLONOSCOPY WITH PROPOFOL N/A 07/27/2019  ? Procedure: COLONOSCOPY WITH PROPOFOL;  Surgeon: Yetta Flock, MD;  Location: [facility];  Service: Gastroenterology;  Laterality:  N/A;  ? ESOPHAGOGASTRODUODENOSCOPY (EGD) WITH PROPOFOL N/A 10/30/2018  ? Procedure: ESOPHAGOGASTRODUODENOSCOPY (EGD) WITH PROPOFOL;  Surgeon: Mauri Pole, MD;  Location: [facility];  Service: Endoscopy;  Laterality: N/A;  ? ESOPHAGOGASTRODUODENOSCOPY (EGD) WITH PROPOFOL N/A 03/12/2019  ? Procedure: ESOPHAGOGASTRODUODENOSCOPY (EGD) WITH PROPOFOL;  Surgeon: Mauri Pole, MD;  Location: [facility];  Service: Endoscopy;  Laterality: N/A;  ? ESOPHAGOGASTRODUODENOSCOPY (EGD) WITH PROPOFOL N/A 07/27/2019  ? Procedure: ESOPHAGOGASTRODUODENOSCOPY (EGD) WITH PROPOFOL;  Surgeon: Yetta Flock, MD;  Location: [facility];  Service: Gastroenterology;  Laterality: N/A;  ? EYE SURGERY  02/2017  ? cataract extraction with lens implant-left  ? HOT HEMOSTASIS N/A 07/27/2019  ? Procedure: HOT HEMOSTASIS (ARGON PLASMA COAGULATION/BICAP);  Surgeon: Yetta Flock, MD;  Location: [facility];  Service: Gastroenterology;  Laterality: N/A;  ? MOUTH SURGERY    ? POLYPECTOMY  07/27/2019  ? Procedure: POLYPECTOMY;  Surgeon: Yetta Flock, MD;  Location: [facility];  Service: Gastroenterology;;  ? TUBAL LIGATION    ? ?Social History  ? ?Tobacco Use  ? Smoking status: Every Day  ?  Packs/day: 1.00  ?  Years: 58.00  ?  Pack years: 58.00  ?  Types: Cigarettes  ?  Passive exposure: Past  ? Smokeless tobacco: Never  ? Tobacco comments:  ?  0.5 ppd - 04/14/21  ?Vaping Use  ? Vaping Use: Never used  ?Substance Use Topics  ? Alcohol use: Not Currently  ? Drug use: No  ? ?Family History  ?Problem Relation Age of Onset  ? Heart failure Father   ? Heart attack Father   ? Colon cancer Maternal Uncle   ? Stroke Mother   ? Esophageal cancer Neg Hx   ? Rectal cancer Neg Hx   ? Stomach cancer Neg Hx   ? Pancreatic cancer Neg Hx   ? ?Allergies  ?Allergen Reactions  ? Oxycodone Nausea And Vomiting  ? Crestor [Rosuvastatin]   ?  Abdominal pain  ?  ? Glipizide Other (See Comments)  ?  Stomach pain  ? Prednisone   ?  GI upset  and general malaise   ? ?Current Outpatient Medications on File Prior to Visit  ?Medication Sig Dispense Refill  ? albuterol (VENTOLIN HFA) 108 (90 Base) MCG/ACT inhaler INHALE 2 PUFFS INTO THE LUNGS EVERY

## 2021-08-05 ENCOUNTER — Encounter: Payer: Self-pay | Admitting: Oncology

## 2021-08-05 ENCOUNTER — Telehealth: Payer: Self-pay | Admitting: Medical Oncology

## 2021-08-05 ENCOUNTER — Encounter (HOSPITAL_COMMUNITY): Payer: Self-pay | Admitting: Internal Medicine

## 2021-08-05 NOTE — Telephone Encounter (Signed)
Pt asking if she can see [facility] for her lung cancer f/u and her anemia. She wants to keep [facility] as her Hematologist. ? ?I told her to keep appt with Dr Baruch Gouty to go over her scan . If he refers you to med oncology then he can set that up for you in [place] or with Dr Julien Nordmann.   ?

## 2021-08-06 ENCOUNTER — Telehealth: Payer: Self-pay

## 2021-08-06 ENCOUNTER — Ambulatory Visit: Payer: Medicare HMO | Admitting: Pulmonary Disease

## 2021-08-06 ENCOUNTER — Encounter: Payer: Self-pay | Admitting: Pulmonary Disease

## 2021-08-06 VITALS — BP 140/62 | HR 77 | Temp 97.5°F | Ht 64.5 in | Wt 112.0 lb

## 2021-08-06 DIAGNOSIS — C3411 Malignant neoplasm of upper lobe, right bronchus or lung: Secondary | ICD-10-CM | POA: Diagnosis not present

## 2021-08-06 DIAGNOSIS — F17210 Nicotine dependence, cigarettes, uncomplicated: Secondary | ICD-10-CM

## 2021-08-06 DIAGNOSIS — R0782 Intercostal pain: Secondary | ICD-10-CM | POA: Diagnosis not present

## 2021-08-06 DIAGNOSIS — M25512 Pain in left shoulder: Secondary | ICD-10-CM | POA: Diagnosis not present

## 2021-08-06 DIAGNOSIS — K7030 Alcoholic cirrhosis of liver without ascites: Secondary | ICD-10-CM

## 2021-08-06 DIAGNOSIS — J449 Chronic obstructive pulmonary disease, unspecified: Secondary | ICD-10-CM

## 2021-08-06 DIAGNOSIS — M6281 Muscle weakness (generalized): Secondary | ICD-10-CM | POA: Diagnosis not present

## 2021-08-06 NOTE — Progress Notes (Signed)
Subjective:    Patient ID: Christine Barton, female    DOB: Aug 22, 1950, 71 y.o.   MRN: [id number] Patient Care Team: Judy Pimple, MD as PCP - General Little Ishikawa, MD as PCP - Cardiology (Cardiology) Kathyrn Sheriff, [facility] as Pharmacist (Pharmacist)  Chief Complaint  Patient presents with   Follow-up    Dry cough at times prod with clear sputum.    HPI The patient is a 71 year old current smoker (0.5 PPD) who presents for follow-up of an 8 mm right upper lobe nodule found incidentally on low-dose CT chest during lung cancer screening.  She was initially evaluated on 16 February 2021.  The patient had had this abnormality followed previously imaged in October 2021 at that time being only 3.7 mm.  The nodule is on the same area of prior radiation changes breast cancer noted in the right lung.  PET/CT performed on 11 February 2021 showed FDG avidity in this nodule.  There was no evidence of other abnormal uptake in the chest with the exception of the aforementioned area of scarring.  The patient opted to forego invasive procedures and go directly to SBRT due to her multiple comorbidities.  She completed SBRT to this area.  She tolerated this well.  He has been monitored closely for potential recurrence.  She had a CT chest performed 03 August 2021 showing residua of radiation but no evidence of recurrence.  A follow-up CT in 3 months is recommended as active infection or inflammation cannot be excluded.  At her prior visit we had given her a trial of Breztri however she felt that this was causing her to cough more.  She is back in using Symbicort and is using it now regularly whereas as before she was using it just as needed.  She did have an episode of thrush but this has now cleared.  She has not had any fevers, chills or sweats.  No chest pain.  Weight is stable.  She has had no anorexia.  No lower extremity edema nor calf tenderness.  Does not endorse any other  symptomatology.   Review of Systems A 10 point review of systems was performed and it is as noted above otherwise negative.  Patient Active Problem List   Diagnosis Date Noted   UTI (urinary tract infection) 08/04/2021   Dehydration 08/04/2021   Agatston CAC score, >400 07/02/2021   Left-sided chest wall pain 06/04/2021   Lung cancer (HCC) 02/25/2021   Iron deficiency anemia 01/01/2021   Iron deficiency anemia due to chronic blood loss 11/06/2020   AVM (arteriovenous malformation) of colon    Benign neoplasm of colon    NASH (nonalcoholic steatohepatitis)    Malnutrition (HCC) 07/25/2019   Symptomatic anemia 07/25/2019   Tobacco abuse 07/25/2019   Mass of soft tissue of face 03/06/2019   HSV infection 02/23/2019   Aortic atherosclerosis (HCC) 01/22/2019   Coronary atherosclerosis 01/22/2019   Encounter for screening for lung cancer 01/04/2019   Venous insufficiency 11/15/2018   Esophageal varices without bleeding (HCC) 10/27/2018   Anemia 10/23/2018   Fatigue 10/23/2018   Marasmus (HCC) 07/26/2018   Poor balance 05/30/2018   Cirrhosis of liver (HCC) 04/24/2018   Ascites 04/24/2018   Gallstones 04/24/2018   Heme positive stool 04/24/2018   Screening mammogram, encounter for 11/07/2017   Smoker 11/07/2017   Screening examination for STD (sexually transmitted disease) 11/11/2016   Welcome to Medicare preventive visit 10/26/2016   Estrogen deficiency 10/26/2016   Colon  cancer screening 11/01/2014   Encounter for routine gynecological examination 09/12/2013   Rapid heart beat 10/10/2012   Left shoulder pain 08/20/2011   Routine general medical examination at a health care facility 07/21/2011   Vitamin D deficiency 08/04/2009   POSTMENOPAUSAL STATUS 08/04/2009   Controlled diabetes mellitus type 2 with complications (HCC) 02/18/2009   Prediabetes 02/18/2009   Hyperlipidemia associated with type 2 diabetes mellitus (HCC) 06/14/2008   DISC DISEASE, CERVICAL 03/30/2007    History of alcohol abuse 12/06/2006   Neuropathy of both feet 12/06/2006   DIVERTICULOSIS, COLON 12/06/2006   Fatty liver 12/06/2006   BREAST CANCER, HX OF 12/06/2006   Social History   Tobacco Use   Smoking status: Every Day    Packs/day: 1.00    Years: 58.00    Pack years: 58.00    Types: Cigarettes    Passive exposure: Past   Smokeless tobacco: Never   Tobacco comments:    0.5 ppd - 04/14/21  Substance Use Topics   Alcohol use: Not Currently   Allergies  Allergen Reactions   Oxycodone Nausea And Vomiting   Crestor [Rosuvastatin]     Abdominal pain     Glipizide Other (See Comments)    Stomach pain   Prednisone     GI upset and general malaise    Current Meds  Medication Sig   albuterol (VENTOLIN HFA) 108 (90 Base) MCG/ACT inhaler INHALE 2 PUFFS INTO THE LUNGS EVERY 4 HOURS AS NEEDED FOR WHEEZE OR FOR SHORTNESS OF BREATH   budesonide-formoterol (SYMBICORT) 160-4.5 MCG/ACT inhaler Inhale 2 puffs into the lungs 2 (two) times daily.   cephALEXin (KEFLEX) 500 MG capsule Take 1 capsule (500 mg total) by mouth 4 (four) times daily.   cholecalciferol (VITAMIN D3) 25 MCG (1000 UNIT) tablet Take 1,000 Units by mouth 2 (two) times daily.   cyclobenzaprine (FLEXERIL) 5 MG tablet Take 1 tablet (5 mg total) by mouth at bedtime.   furosemide (LASIX) 20 MG tablet TAKE 1 TABLET BY MOUTH EVERY DAY   lactulose (CHRONULAC) 10 GM/15ML solution Take 15 mLs (10 g total) by mouth 3 (three) times daily.   metFORMIN (GLUCOPHAGE-XR) 500 MG 24 hr tablet TAKE 1 TABLET BY MOUTH EVERY DAY WITH BREAKFAST   Multiple Vitamin (MULTIVITAMIN) tablet Take 1 tablet by mouth daily.   Multiple Vitamins-Minerals (PRESERVISION AREDS 2) CAPS Take 1 capsule by mouth 2 (two) times daily.   nadolol (CORGARD) 20 MG tablet TAKE 1 TABLET BY MOUTH EVERY DAY   nystatin (MYCOSTATIN) 100000 UNIT/ML suspension TAKE 5 MLS (500,000 UNITS TOTAL) BY MOUTH 3 (THREE) TIMES DAILY. SWISH AND SWALLOW   pantoprazole (PROTONIX) 40 MG  tablet TAKE 1 TABLET (40 MG TOTAL) BY MOUTH 2 (TWO) TIMES DAILY BEFORE A MEAL. (Patient taking differently: Take 40 mg by mouth daily.)   SYSTANE COMPLETE 0.6 % SOLN Apply 1 drop to eye 2 (two) times daily.   traMADol (ULTRAM) 50 MG tablet Take 1 tablet (50 mg total) by mouth every 8 (eight) hours as needed for severe pain.   ursodiol (ACTIGALL) 300 MG capsule TAKE 1 CAPSULE BY MOUTH EVERY DAY   XIFAXAN 550 MG TABS tablet TAKE 1 TABLET BY MOUTH TWICE A DAY   Immunization History  Administered Date(s) Administered   Fluad Quad(high Dose 65+) 12/04/2018, 01/19/2020   Hep A / Hep B 09/05/2018, 10/10/2018, 03/29/2019   Influenza Split 03/10/2011   Influenza Whole 02/11/2000, 02/06/2016   Influenza, High Dose Seasonal PF 02/03/2021   Influenza,inj,Quad PF,6+ Mos 02/16/2017,  04/18/2018   Influenza-Unspecified 02/11/2016   PFIZER(Purple Top)SARS-COV-2 Vaccination 05/21/2019, 06/16/2019, 02/13/2020   Pfizer Covid-19 Vaccine Bivalent Booster 76yrs & up 02/10/2021   Pneumococcal Conjugate-13 10/26/2016   Pneumococcal Polysaccharide-23 06/28/2008, 11/07/2017   Td 05/21/2002   Tdap 09/12/2013   Zoster, Live 08/24/2011   Immunization History  Administered Date(s) Administered   Fluad Quad(high Dose 65+) 12/04/2018, 01/19/2020   Hep A / Hep B 09/05/2018, 10/10/2018, 03/29/2019   Influenza Split 03/10/2011   Influenza Whole 02/11/2000, 02/06/2016   Influenza, High Dose Seasonal PF 02/03/2021   Influenza,inj,Quad PF,6+ Mos 02/16/2017, 04/18/2018   Influenza-Unspecified 02/11/2016   PFIZER(Purple Top)SARS-COV-2 Vaccination 05/21/2019, 06/16/2019, 02/13/2020   Pfizer Covid-19 Vaccine Bivalent Booster 23yrs & up 02/10/2021   Pneumococcal Conjugate-13 10/26/2016   Pneumococcal Polysaccharide-23 06/28/2008, 11/07/2017   Td 05/21/2002   Tdap 09/12/2013   Zoster, Live 08/24/2011       Objective:   Physical Exam BP 140/62 (BP Location: Left Arm, Cuff Size: Normal)   Pulse 77   Temp (!) 97.5 F  (36.4 C) (Temporal)   Ht 5' 4.5" (1.638 m)   Wt 112 lb (50.8 kg)   LMP  (LMP Unknown)   SpO2 99%   BMI 18.93 kg/m   GENERAL: Thin, frail woman, no acute distress, fully ambulatory, no conversational dyspnea. HEAD: Normocephalic, atraumatic.  EYES: Pupils equal, round, reactive to light.  No scleral icterus.  MOUTH: Nose/mouth/throat not examined due to masking requirements for COVID 19. NECK: Supple. No thyromegaly. Trachea midline. No JVD.  No adenopathy. PULMONARY: Good air entry bilaterally.  Coarse breath sounds, diffuse end expiratory wheezes. CARDIOVASCULAR: S1 and S2. Regular rate and rhythm.  ABDOMEN: Scaphoid otherwise benign. MUSCULOSKELETAL: No joint deformity, no clubbing, no edema.  NEUROLOGIC: Grossly nonfocal.  Speech fluent. SKIN: Intact,warm,dry. PSYCH: Mood and behavior normal.      Assessment & Plan:     ICD-10-CM   1. COPD suggested by initial evaluation (HCC)  J44.9    Continue Symbicort 2 puffs twice a day Continue as needed albuterol    2. Malignant neoplasm of upper lobe of right lung (HCC)  C34.11    Status post SBRT No evidence of recurrence    3. Alcoholic cirrhosis of liver without ascites (HCC)  K70.30    Appears well compensated    4. Tobacco dependence due to cigarettes  F17.210    Patient was counseled regards discontinuation of smoking Total counseling time 3 to 5 minutes     Patient will see [place] in follow-up in 4 months time she is to contact us prior to that time should any new difficulties arise.  Gailen Shelter, MD Advanced Bronchoscopy PCCM [facility]    *This note was dictated using voice recognition software/Dragon.  Despite best efforts to proofread, errors can occur which can change the meaning. Any transcriptional errors that result from this process are unintentional and may not be fully corrected at the time of dictation.

## 2021-08-06 NOTE — Telephone Encounter (Signed)
error 

## 2021-08-06 NOTE — Telephone Encounter (Signed)
I left a detailed message for the pt. ?

## 2021-08-06 NOTE — Patient Instructions (Signed)
Continue using your Symbicort. ? ?Rinse your mouth well after you use it. ? ?We will see you in follow-up in 4 months time call sooner should any new problems arise. ?

## 2021-08-07 ENCOUNTER — Telehealth: Payer: Self-pay | Admitting: Internal Medicine

## 2021-08-07 NOTE — Telephone Encounter (Signed)
Called patient regarding upcoming May appointment, patient is notified. ?

## 2021-08-09 DIAGNOSIS — E1169 Type 2 diabetes mellitus with other specified complication: Secondary | ICD-10-CM | POA: Diagnosis not present

## 2021-08-09 DIAGNOSIS — E785 Hyperlipidemia, unspecified: Secondary | ICD-10-CM | POA: Diagnosis not present

## 2021-08-10 ENCOUNTER — Other Ambulatory Visit: Payer: Self-pay | Admitting: *Deleted

## 2021-08-10 ENCOUNTER — Telehealth: Payer: Self-pay

## 2021-08-10 ENCOUNTER — Ambulatory Visit
Admission: RE | Admit: 2021-08-10 | Discharge: 2021-08-10 | Disposition: A | Discharge status: Home / Self Care | Payer: Medicare HMO | Source: Ambulatory Visit | Attending: Radiation Oncology | Admitting: Radiation Oncology

## 2021-08-10 ENCOUNTER — Encounter: Payer: Self-pay | Admitting: Radiation Oncology

## 2021-08-10 VITALS — BP 139/56 | HR 74 | Temp 95.5°F | Resp 16 | Ht 64.5 in | Wt 109.6 lb

## 2021-08-10 DIAGNOSIS — R0782 Intercostal pain: Secondary | ICD-10-CM | POA: Diagnosis not present

## 2021-08-10 DIAGNOSIS — C3490 Malignant neoplasm of unspecified part of unspecified bronchus or lung: Secondary | ICD-10-CM

## 2021-08-10 DIAGNOSIS — R918 Other nonspecific abnormal finding of lung field: Secondary | ICD-10-CM | POA: Diagnosis not present

## 2021-08-10 DIAGNOSIS — R059 Cough, unspecified: Secondary | ICD-10-CM | POA: Diagnosis not present

## 2021-08-10 DIAGNOSIS — M25512 Pain in left shoulder: Secondary | ICD-10-CM | POA: Diagnosis not present

## 2021-08-10 DIAGNOSIS — Z923 Personal history of irradiation: Secondary | ICD-10-CM | POA: Diagnosis not present

## 2021-08-10 DIAGNOSIS — Z08 Encounter for follow-up examination after completed treatment for malignant neoplasm: Secondary | ICD-10-CM | POA: Diagnosis not present

## 2021-08-10 DIAGNOSIS — M6281 Muscle weakness (generalized): Secondary | ICD-10-CM | POA: Diagnosis not present

## 2021-08-10 NOTE — Telephone Encounter (Signed)
Lmom to r/s for rph Marcelle Overlie at [facility]. Will rout to the scheduling pool at [facility] to continue calling 2 more times ?

## 2021-08-10 NOTE — Progress Notes (Signed)
Radiation Oncology ?Follow up Note ? ?Name: Christine Barton   ?Date:   08/10/2021 ?MRN:  [id number] ?DOB: 10/06/1950  ? ? ?This 71 y.o. female presents to the clinic today for 36-monthfollow-up status post SBRT to her superior segment the right lower lobe for presumed stage I non-small cell lung cancer. ? ?REFERRING PROVIDER: Tower, MWynelle Fanny MD ? ?HPI: Patient is a 71year-old female now at 4 months having completed SBRT to her right lower lobe for presumed stage I non-small cell lung cancer seen today in routine follow-up she is doing well.  Minor cough.  No dysphagia fatigue or change in her pulmonary status..  She had a recent CT scan which I have ordered showing development of plaque-like subpleural region mixed attenuation airspace in the right upper lobe consistent with radiation scarring. ? ?COMPLICATIONS OF TREATMENT: none ? ?FOLLOW UP COMPLIANCE: keeps appointments  ? ?PHYSICAL EXAM:  ?BP (!) 139/56 (BP Location: Right Arm, Patient Position: Sitting)   Pulse 74   Temp (!) 95.5 ?F (35.3 ?C) (Tympanic)   Resp 16   Ht 5' 4.5" (1.638 m)   Wt 109 lb 9.6 oz (49.7 kg)   LMP  (LMP Unknown)   BMI 18.52 kg/m?  ?Well-developed well-nourished patient in NAD. HEENT reveals PERLA, EOMI, discs not visualized.  Oral cavity is clear. No oral mucosal lesions are identified. Neck is clear without evidence of cervical or supraclavicular adenopathy. Lungs are clear to A&P. Cardiac examination is essentially unremarkable with regular rate and rhythm without murmur rub or thrill. Abdomen is benign with no organomegaly or masses noted. Motor sensory and DTR levels are equal and symmetric in the upper and lower extremities. Cranial nerves II through XII are grossly intact. Proprioception is intact. No peripheral adenopathy or edema is identified. No motor or sensory levels are noted. Crude visual fields are within normal range. ? ?RADIOLOGY RESULTS: CT scan reviewed compatible with above-stated findings ? ?PLAN: Present time  patient is doing well very low side effect profile from her recent SBRT.  I like to wait another 6 months repeat her CT scan.  We will see her back in that time for follow-up.  Patient knows to call at anytime with any concerns. ? ?I would like to take this opportunity to thank you for allowing me to participate in the care of your patient.. ?  ? GNoreene Filbert MD ? ?

## 2021-08-10 NOTE — Telephone Encounter (Signed)
-----   Message from Ramond Dial, [place] sent at 08/07/2021 12:39 PM EDT ----- ?Can you call patient and see if she is interested in rescheduling with me? ?----- Message ----- ?From: Ramond Dial, RPH-CPP ?Sent: 08/03/2021  12:00 AM EDT ?To: Ramond Dial, RPH-CPP ? ?Reschedule pt apt due to ending early ? ? ?

## 2021-08-12 ENCOUNTER — Ambulatory Visit: Payer: Medicare HMO | Admitting: Orthopedic Surgery

## 2021-08-14 DIAGNOSIS — M19012 Primary osteoarthritis, left shoulder: Secondary | ICD-10-CM | POA: Diagnosis not present

## 2021-08-20 ENCOUNTER — Other Ambulatory Visit: Payer: Self-pay | Admitting: Family Medicine

## 2021-08-20 DIAGNOSIS — M6281 Muscle weakness (generalized): Secondary | ICD-10-CM | POA: Diagnosis not present

## 2021-08-20 DIAGNOSIS — R0782 Intercostal pain: Secondary | ICD-10-CM | POA: Diagnosis not present

## 2021-08-20 DIAGNOSIS — M25512 Pain in left shoulder: Secondary | ICD-10-CM | POA: Diagnosis not present

## 2021-08-21 ENCOUNTER — Telehealth: Payer: Self-pay | Admitting: Radiation Oncology

## 2021-08-21 ENCOUNTER — Other Ambulatory Visit: Payer: Medicare HMO

## 2021-08-21 DIAGNOSIS — Z515 Encounter for palliative care: Secondary | ICD-10-CM

## 2021-08-21 NOTE — Telephone Encounter (Signed)
pt called in stating that she no longer needs the appt, she says that she will be recieving care in [place],..KJ  ?

## 2021-08-21 NOTE — Progress Notes (Signed)
COMMUNITY PALLIATIVE CARE SW NOTE ? ?PATIENT NAME: NIDYA BOUYER ?DOB: December 09, 1950 ?MRN: [id number] ? ?PRIMARY CARE PROVIDER: [name], MD ? ?RESPONSIBLE PARTY:  ?Acct ID - Guarantor Home Phone Work Phone Relationship Acct Type  ?[id number] Gomez [phone]  Self P/F  ?   [place], [place],  [address]  ? ? ?PC SW connected with patient via telephone. ? ?Patient provided update on medical condition and/or changes. Patient shares that she has been maintaining same level of care since last in home visit, with no changes or declines. No falls pain reported. No falls reported. ? ?Appetite is fair. No significant weight changes noted. Drinks Boost plus. ? ?No recent medication needs or adjustments noted. ? ?Psychosocial assessment: No financial, food insecurities or issues with transportation noted. No other psychosocial needs. No S/S of depression or anxiety noted.  ? ?Patient would like to schedule PC visit to address and complete a MOST form. In home visit scheduled with PC NP - L. Rivers for 6/1@1pm . ? ?Palliative care will continue to monitor and assist with long term care planning as needed.  ? ?SOCIAL HX:  ?Social History  ? ?Tobacco Use  ? Smoking status: Every Day  ?  Packs/day: 1.00  ?  Years: 58.00  ?  Pack years: 58.00  ?  Types: Cigarettes  ?  Passive exposure: Past  ? Smokeless tobacco: Never  ? Tobacco comments:  ?  0.5 ppd - 04/14/21  ?Substance Use Topics  ? Alcohol use: Not Currently  ? ? ? ? ? ? ? ? ?Doreene Eland, LCSW ? ?

## 2021-08-24 ENCOUNTER — Other Ambulatory Visit: Payer: Self-pay

## 2021-08-24 ENCOUNTER — Inpatient Hospital Stay: Payer: Medicare HMO | Attending: Physician Assistant

## 2021-08-24 ENCOUNTER — Encounter: Payer: Self-pay | Admitting: Internal Medicine

## 2021-08-24 ENCOUNTER — Inpatient Hospital Stay (HOSPITAL_BASED_OUTPATIENT_CLINIC_OR_DEPARTMENT_OTHER): Payer: Medicare HMO | Admitting: Internal Medicine

## 2021-08-24 VITALS — BP 140/69 | HR 80 | Temp 96.9°F | Resp 18 | Wt 109.1 lb

## 2021-08-24 DIAGNOSIS — D500 Iron deficiency anemia secondary to blood loss (chronic): Secondary | ICD-10-CM

## 2021-08-24 DIAGNOSIS — R5383 Other fatigue: Secondary | ICD-10-CM | POA: Diagnosis not present

## 2021-08-24 DIAGNOSIS — C3490 Malignant neoplasm of unspecified part of unspecified bronchus or lung: Secondary | ICD-10-CM

## 2021-08-24 DIAGNOSIS — R079 Chest pain, unspecified: Secondary | ICD-10-CM | POA: Diagnosis not present

## 2021-08-24 DIAGNOSIS — R0782 Intercostal pain: Secondary | ICD-10-CM | POA: Diagnosis not present

## 2021-08-24 DIAGNOSIS — C3432 Malignant neoplasm of lower lobe, left bronchus or lung: Secondary | ICD-10-CM | POA: Diagnosis not present

## 2021-08-24 DIAGNOSIS — M25512 Pain in left shoulder: Secondary | ICD-10-CM | POA: Diagnosis not present

## 2021-08-24 DIAGNOSIS — M6281 Muscle weakness (generalized): Secondary | ICD-10-CM | POA: Diagnosis not present

## 2021-08-24 LAB — CBC WITH DIFFERENTIAL (CANCER CENTER ONLY)
Abs Immature Granulocytes: 0.33 10*3/uL — ABNORMAL HIGH (ref 0.00–0.07)
Basophils Absolute: 0 10*3/uL (ref 0.0–0.1)
Basophils Relative: 0 %
Eosinophils Absolute: 0.1 10*3/uL (ref 0.0–0.5)
Eosinophils Relative: 2 %
HCT: 37 % (ref 36.0–46.0)
Hemoglobin: 12 g/dL (ref 12.0–15.0)
Immature Granulocytes: 4 %
Lymphocytes Relative: 14 %
Lymphs Abs: 1.1 10*3/uL (ref 0.7–4.0)
MCH: 28.5 pg (ref 26.0–34.0)
MCHC: 32.4 g/dL (ref 30.0–36.0)
MCV: 87.9 fL (ref 80.0–100.0)
Monocytes Absolute: 0.5 10*3/uL (ref 0.1–1.0)
Monocytes Relative: 6 %
Neutro Abs: 5.5 10*3/uL (ref 1.7–7.7)
Neutrophils Relative %: 74 %
Platelet Count: 266 10*3/uL (ref 150–400)
RBC: 4.21 MIL/uL (ref 3.87–5.11)
RDW: 18.9 % — ABNORMAL HIGH (ref 11.5–15.5)
WBC Count: 7.5 10*3/uL (ref 4.0–10.5)
nRBC: 0 % (ref 0.0–0.2)

## 2021-08-24 LAB — FERRITIN: Ferritin: 147 ng/mL (ref 11–307)

## 2021-08-24 LAB — IRON AND IRON BINDING CAPACITY (CC-WL,HP ONLY)
Iron: 51 ug/dL (ref 28–170)
Saturation Ratios: 12 % (ref 10.4–31.8)
TIBC: 421 ug/dL (ref 250–450)
UIBC: 370 ug/dL (ref 148–442)

## 2021-08-24 MED ORDER — DOXYCYCLINE HYCLATE 100 MG PO TABS: 100.0000 mg | ORAL_TABLET | Freq: Two times a day (BID) | ORAL | 0 refills | Status: DC

## 2021-08-24 NOTE — Patient Instructions (Signed)
Steps to Quit Smoking ?Smoking tobacco is the leading cause of preventable death. It can affect almost every organ in the body. Smoking puts you and people around you at risk for many serious, long-lasting (chronic) diseases. Quitting smoking can be hard, but it is one of the best things that you can do for your health. It is never too late to quit. ?Do not give up if you cannot quit the first time. Some people need to try many times to quit. Do your best to stick to your quit plan, and talk with your doctor if you have any questions or concerns. ?How do I get ready to quit? ?Pick a date to quit. Set a date within the next 2 weeks to give you time to prepare. ?Write down the reasons why you are quitting. Keep this list in places where you will see it often. ?Tell your family, friends, and co-workers that you are quitting. Their support is important. ?Talk with your doctor about the choices that may help you quit. ?Find out if your health insurance will pay for these treatments. ?Know the people, places, things, and activities that make you want to smoke (triggers). Avoid them. ?What first steps can I take to quit smoking? ?Throw away all cigarettes at home, at work, and in your car. ?Throw away the things that you use when you smoke, such as ashtrays and lighters. ?Clean your car. Empty the ashtray. ?Clean your home, including curtains and carpets. ?What can I do to help me quit smoking? ?Talk with your doctor about taking medicines and seeing a counselor. You are more likely to succeed when you do both. ?If you are pregnant or breastfeeding: ?Talk with your doctor about counseling or other ways to quit smoking. ?Do not take medicine to help you quit smoking unless your doctor tells you to. ?Quit right away ?Quit smoking completely, instead of slowly cutting back on how much you smoke over a period of time. Stopping smoking right away may be more successful than slowly quitting. ?Go to counseling. In-person is best  if this is an option. You are more likely to quit if you go to counseling sessions regularly. ?Take medicine ?You may take medicines to help you quit. Some medicines need a prescription, and some you can buy over-the-counter. Some medicines may contain a drug called nicotine to replace the nicotine in cigarettes. Medicines may: ?Help you stop having the desire to smoke (cravings). ?Help to stop the problems that come when you stop smoking (withdrawal symptoms). ?Your doctor may ask you to use: ?Nicotine patches, gum, or lozenges. ?Nicotine inhalers or sprays. ?Non-nicotine medicine that you take by mouth. ?Find resources ?Find resources and other ways to help you quit smoking and remain smoke-free after you quit. They include: ?Online chats with a Social worker. ?Phone quitlines. ?Careers information officer. ?Support groups or group counseling. ?Text messaging programs. ?Mobile phone apps. Use apps on your mobile phone or tablet that can help you stick to your quit plan. Examples of free services include Quit Guide from the CDC and smokefree.gov ? ?What can I do to make it easier to quit? ? ?Talk to your family and friends. Ask them to support and encourage you. ?Call a phone quitline, such as 1-800-QUIT-NOW, reach out to support groups, or work with a Social worker. ?Ask people who smoke to not smoke around you. ?Avoid places that make you want to smoke, such as: ?Bars. ?Parties. ?Smoke-break areas at work. ?Spend time with people who do not smoke. ?Lower  the stress in your life. Stress can make you want to smoke. Try these things to lower stress: ?Getting regular exercise. ?Doing deep-breathing exercises. ?Doing yoga. ?Meditating. ?What benefits will I see if I quit smoking? ?Over time, you may have: ?A better sense of smell and taste. ?Less coughing and sore throat. ?A slower heart rate. ?Lower blood pressure. ?Clearer skin. ?Better breathing. ?Fewer sick days. ?Summary ?Quitting smoking can be hard, but it is one of  the best things that you can do for your health. ?Do not give up if you cannot quit the first time. Some people need to try many times to quit. ?When you decide to quit smoking, make a plan to help you succeed. ?Quit smoking right away, not slowly over a period of time. ?When you start quitting, get help and support to keep you smoke-free. ?This information is not intended to replace advice given to you by your health care provider. Make sure you discuss any questions you have with your health care provider. ?Document Revised: 03/20/2021 Document Reviewed: 03/20/2021 ?Elsevier Patient Education ? [place]. ? ?

## 2021-08-24 NOTE — Progress Notes (Signed)
?    [place] ?Telephone:([phone]) [phone]   [phone] ? ?OFFICE PROGRESS NOTE ? ?Tower, [name], MD ?[address] ?[place] [address] ? ?DIAGNOSIS:  ?1) Anemia secondary to GI occult blood loss.  ? 2) suspicious for stage Ia (T1 a, N0, M0) lung cancer.  The patient declined bronchoscopy for tissue biopsy. ? ?PRIOR THERAPY:  ?1) Oral iron supplements. Discontinued due to intolerance (severe abdominal pain) ?2) status post SBRT to the lung lesion under the care of Dr. Donella Stade at [facility]. ? ?CURRENT THERAPY: IV iron infusions with venofer PRN. Last infusion on in December 2022. ? ?INTERVAL HISTORY: ?Christine Barton 71 y.o. female returns to the clinic today for follow-up visit.  The patient is feeling fine today with no concerning complaints except for mild fatigue and right-sided chest pain.  She tolerated her previous radiotherapy under the care of Dr. Donella Stade at [facility] fairly well.  Just some miscommunication issue with Dr. Donella Stade and she would like to transfer her care regarding the lung cancer to [facility].  She denied having any shortness of breath, cough or hemoptysis.  She has no nausea, vomiting, diarrhea or constipation.  She has no headache or visual changes.  She denied having any significant weight loss or night sweats. ? ?MEDICAL HISTORY: ?Past Medical History:  ?Diagnosis Date  ? Alcohol abuse, unspecified   ? Breast cancer ([place]) 1998  ? Right  ? Cataract   ? left eye  ? Cervical spondylosis 2006  ? MRI  ? Degeneration of cervical intervertebral disc 2006  ? MRI  ? Diabetes mellitus without complication ([place])   ? Diverticulosis of colon (without mention of hemorrhage)   ? Hyperpotassemia   ? Lung cancer ([place]) 03/2021  ? started radiation in January 2023, right side  ? Microscopic hematuria   ? Mononeuritis of unspecified site   ? Nonspecific abnormal results of liver function study   ? Other abnormal glucose   ? Other and  unspecified hyperlipidemia   ? no per pt  ? Other chronic nonalcoholic liver disease   ? Personal history of chemotherapy   ? Personal history of malignant neoplasm of breast   ? Personal history of radiation therapy   ? Pneumothorax, acute   ? right, spontaneous  ? Tobacco use disorder   ? Unspecified vitamin D deficiency   ? ? ?ALLERGIES:  is allergic to oxycodone, crestor [rosuvastatin], glipizide, and prednisone. ? ?MEDICATIONS:  ?Current Outpatient Medications  ?Medication Sig Dispense Refill  ? albuterol (VENTOLIN HFA) 108 (90 Base) MCG/ACT inhaler INHALE 2 PUFFS INTO THE LUNGS EVERY 4 HOURS AS NEEDED FOR WHEEZE OR FOR SHORTNESS OF BREATH 18 each 3  ? budesonide-formoterol (SYMBICORT) 160-4.5 MCG/ACT inhaler Inhale 2 puffs into the lungs 2 (two) times daily.    ? cephALEXin (KEFLEX) 500 MG capsule Take 1 capsule (500 mg total) by mouth 4 (four) times daily. 28 capsule 0  ? cholecalciferol (VITAMIN D3) 25 MCG (1000 UNIT) tablet Take 1,000 Units by mouth 2 (two) times daily.    ? cyclobenzaprine (FLEXERIL) 5 MG tablet Take 1 tablet (5 mg total) by mouth at bedtime. 1 tablet 0  ? furosemide (LASIX) 20 MG tablet TAKE 1 TABLET BY MOUTH EVERY DAY 90 tablet 1  ? lactulose (CHRONULAC) 10 GM/15ML solution Take 15 mLs (10 g total) by mouth 3 (three) times daily. 236 mL 11  ? metFORMIN (GLUCOPHAGE-XR) 500 MG 24 hr tablet TAKE 1 TABLET  BY MOUTH EVERY DAY WITH BREAKFAST 90 tablet 1  ? Multiple Vitamin (MULTIVITAMIN) tablet Take 1 tablet by mouth daily.    ? Multiple Vitamins-Minerals (PRESERVISION AREDS 2) CAPS Take 1 capsule by mouth 2 (two) times daily.    ? nadolol (CORGARD) 20 MG tablet TAKE 1 TABLET BY MOUTH EVERY DAY 90 tablet 3  ? nystatin (MYCOSTATIN) 100000 UNIT/ML suspension TAKE 5 MLS (500,000 UNITS TOTAL) BY MOUTH 3 (THREE) TIMES DAILY. SWISH AND SWALLOW 120 mL 0  ? pantoprazole (PROTONIX) 40 MG tablet TAKE 1 TABLET (40 MG TOTAL) BY MOUTH 2 (TWO) TIMES DAILY BEFORE A MEAL. (Patient taking differently: Take 40  mg by mouth daily.) 180 tablet 1  ? SYSTANE COMPLETE 0.6 % SOLN Apply 1 drop to eye 2 (two) times daily.    ? traMADol (ULTRAM) 50 MG tablet Take 1 tablet (50 mg total) by mouth every 8 (eight) hours as needed for severe pain. 15 tablet 0  ? ursodiol (ACTIGALL) 300 MG capsule TAKE 1 CAPSULE BY MOUTH EVERY DAY 30 capsule 1  ? XIFAXAN 550 MG TABS tablet TAKE 1 TABLET BY MOUTH TWICE A DAY 60 tablet 3  ? ?No current facility-administered medications for this visit.  ? ? ?SURGICAL HISTORY:  ?Past Surgical History:  ?Procedure Laterality Date  ? BIOPSY  07/27/2019  ? Procedure: BIOPSY;  Surgeon: Yetta Flock, MD;  Location: [facility];  Service: Gastroenterology;;  ? BREAST BIOPSY  9/03  ? Right  ? BREAST LUMPECTOMY Right 1998  ? CHEST TUBE INSERTION  11/02/2014  ? COLONOSCOPY    ? COLONOSCOPY WITH PROPOFOL N/A 07/27/2019  ? Procedure: COLONOSCOPY WITH PROPOFOL;  Surgeon: Yetta Flock, MD;  Location: [facility];  Service: Gastroenterology;  Laterality: N/A;  ? ESOPHAGOGASTRODUODENOSCOPY (EGD) WITH PROPOFOL N/A 10/30/2018  ? Procedure: ESOPHAGOGASTRODUODENOSCOPY (EGD) WITH PROPOFOL;  Surgeon: Mauri Pole, MD;  Location: [facility];  Service: Endoscopy;  Laterality: N/A;  ? ESOPHAGOGASTRODUODENOSCOPY (EGD) WITH PROPOFOL N/A 03/12/2019  ? Procedure: ESOPHAGOGASTRODUODENOSCOPY (EGD) WITH PROPOFOL;  Surgeon: Mauri Pole, MD;  Location: [facility];  Service: Endoscopy;  Laterality: N/A;  ? ESOPHAGOGASTRODUODENOSCOPY (EGD) WITH PROPOFOL N/A 07/27/2019  ? Procedure: ESOPHAGOGASTRODUODENOSCOPY (EGD) WITH PROPOFOL;  Surgeon: Yetta Flock, MD;  Location: [facility];  Service: Gastroenterology;  Laterality: N/A;  ? EYE SURGERY  02/2017  ? cataract extraction with lens implant-left  ? HOT HEMOSTASIS N/A 07/27/2019  ? Procedure: HOT HEMOSTASIS (ARGON PLASMA COAGULATION/BICAP);  Surgeon: Yetta Flock, MD;  Location: [facility];  Service: Gastroenterology;  Laterality: N/A;  ? MOUTH  SURGERY    ? POLYPECTOMY  07/27/2019  ? Procedure: POLYPECTOMY;  Surgeon: Yetta Flock, MD;  Location: [facility];  Service: Gastroenterology;;  ? TUBAL LIGATION    ? ? ?REVIEW OF SYSTEMS:  Constitutional: positive for fatigue ?Eyes: negative ?Ears, nose, mouth, throat, and face: negative ?Respiratory: positive for pleurisy/chest pain ?Cardiovascular: negative ?Gastrointestinal: negative ?Genitourinary:negative ?Integument/breast: negative ?Hematologic/lymphatic: negative ?Musculoskeletal:negative ?Neurological: negative ?Behavioral/Psych: negative ?Endocrine: negative ?Allergic/Immunologic: negative  ? ?PHYSICAL EXAMINATION: General appearance: alert, cooperative, fatigued, and no distress ?Head: Normocephalic, without obvious abnormality, atraumatic ?Neck: no adenopathy, no JVD, supple, symmetrical, trachea midline, and thyroid not enlarged, symmetric, no tenderness/mass/nodules ?Lymph nodes: Cervical, supraclavicular, and axillary nodes normal. ?Resp: clear to auscultation bilaterally ?Back: symmetric, no curvature. ROM normal. No CVA tenderness. ?Cardio: regular rate and rhythm, S1, S2 normal, no murmur, click, rub or gallop ?GI: soft, non-tender; bowel sounds normal; no masses,  no organomegaly ?Extremities: extremities normal, atraumatic, no cyanosis  or edema ?Neurologic: Alert and oriented X 3, normal strength and tone. Normal symmetric reflexes. Normal coordination and gait ? ?ECOG PERFORMANCE STATUS: 1 - Symptomatic but completely ambulatory ? ?Blood pressure 140/69, pulse 80, temperature (!) 96.9 ?F (36.1 ?C), temperature source Tympanic, resp. rate 18, weight 109 lb 1 oz (49.5 kg), SpO2 98 %. ? ?LABORATORY DATA: ?Lab Results  ?Component Value Date  ? WBC 7.5 08/24/2021  ? HGB 12.0 08/24/2021  ? HCT 37.0 08/24/2021  ? MCV 87.9 08/24/2021  ? PLT 266 08/24/2021  ? ? ?  Chemistry   ?   ?Component Value Date/Time  ? NA 135 07/27/2021 1551  ? NA 132 (L) 05/26/2021 1147  ? K 4.0 07/27/2021 1551  ? CL  102 07/27/2021 1551  ? CO2 25 07/27/2021 1551  ? BUN 12 07/27/2021 1551  ? BUN 21 05/26/2021 1147  ? CREATININE 0.55 07/27/2021 1551  ?    ?Component Value Date/Time  ? CALCIUM 9.3 07/27/2021 1551  ? ALKPHOS

## 2021-08-26 ENCOUNTER — Telehealth: Payer: Self-pay | Admitting: Gastroenterology

## 2021-08-26 ENCOUNTER — Other Ambulatory Visit: Payer: Self-pay | Admitting: Internal Medicine

## 2021-08-26 MED ORDER — AMOXICILLIN-POT CLAVULANATE 875-125 MG PO TABS
1.0000 | ORAL_TABLET | Freq: Two times a day (BID) | ORAL | 0 refills | Status: DC
Start: 2021-08-26 — End: 2021-09-09

## 2021-08-26 NOTE — Telephone Encounter (Signed)
Patient called to schedule a follow up appointment with Dr. [name].  At her appointment in March, she was told to come back in 3 months, but at the time, the schedule was not available.  She said she had asked someone to schedule her as soon as the schedule came out, but no one ever called her.  Now there are no appointments through the end of July.  Is there any way to accommodate her?  Please call patient and advise.  Thank you. ?

## 2021-08-26 NOTE — Telephone Encounter (Signed)
Spoke with the patient. Helped her schedule her 3 month follow up with Dr [name]. She has been having shoulder problems. Otherwise, no new problems. ?

## 2021-08-27 DIAGNOSIS — R0782 Intercostal pain: Secondary | ICD-10-CM | POA: Diagnosis not present

## 2021-08-27 DIAGNOSIS — M6281 Muscle weakness (generalized): Secondary | ICD-10-CM | POA: Diagnosis not present

## 2021-08-27 DIAGNOSIS — M25512 Pain in left shoulder: Secondary | ICD-10-CM | POA: Diagnosis not present

## 2021-08-31 DIAGNOSIS — M6281 Muscle weakness (generalized): Secondary | ICD-10-CM | POA: Diagnosis not present

## 2021-08-31 DIAGNOSIS — M25512 Pain in left shoulder: Secondary | ICD-10-CM | POA: Diagnosis not present

## 2021-08-31 DIAGNOSIS — R0782 Intercostal pain: Secondary | ICD-10-CM | POA: Diagnosis not present

## 2021-09-03 ENCOUNTER — Encounter: Payer: Self-pay | Admitting: Nurse Practitioner

## 2021-09-03 ENCOUNTER — Ambulatory Visit (INDEPENDENT_AMBULATORY_CARE_PROVIDER_SITE_OTHER): Payer: Medicare HMO | Admitting: Nurse Practitioner

## 2021-09-03 ENCOUNTER — Ambulatory Visit (INDEPENDENT_AMBULATORY_CARE_PROVIDER_SITE_OTHER)
Admission: RE | Admit: 2021-09-03 | Discharge: 2021-09-03 | Disposition: A | Discharge status: Home / Self Care | Payer: Medicare HMO | Source: Ambulatory Visit | Attending: Nurse Practitioner | Admitting: Nurse Practitioner

## 2021-09-03 VITALS — BP 126/60 | HR 89 | Temp 96.4°F | Resp 24 | Wt 106.6 lb

## 2021-09-03 DIAGNOSIS — R062 Wheezing: Secondary | ICD-10-CM

## 2021-09-03 DIAGNOSIS — R051 Acute cough: Secondary | ICD-10-CM | POA: Diagnosis not present

## 2021-09-03 DIAGNOSIS — R079 Chest pain, unspecified: Secondary | ICD-10-CM | POA: Diagnosis not present

## 2021-09-03 DIAGNOSIS — R0789 Other chest pain: Secondary | ICD-10-CM

## 2021-09-03 DIAGNOSIS — R0602 Shortness of breath: Secondary | ICD-10-CM

## 2021-09-03 DIAGNOSIS — J439 Emphysema, unspecified: Secondary | ICD-10-CM

## 2021-09-03 DIAGNOSIS — I700 Atherosclerosis of aorta: Secondary | ICD-10-CM | POA: Diagnosis not present

## 2021-09-03 DIAGNOSIS — Z720 Tobacco use: Secondary | ICD-10-CM

## 2021-09-03 LAB — COMPREHENSIVE METABOLIC PANEL
ALT: 9 U/L (ref 0–35)
AST: 17 U/L (ref 0–37)
Albumin: 4.3 g/dL (ref 3.5–5.2)
Alkaline Phosphatase: 84 U/L (ref 39–117)
BUN: 18 mg/dL (ref 6–23)
CO2: 29 mEq/L (ref 19–32)
Calcium: 10.4 mg/dL (ref 8.4–10.5)
Chloride: 97 mEq/L (ref 96–112)
Creatinine, Ser: 0.63 mg/dL (ref 0.40–1.20)
GFR: 89.69 mL/min (ref >60.00)
Glucose, Bld: 272 mg/dL — ABNORMAL HIGH (ref 70–99)
Potassium: 4.7 mEq/L (ref 3.5–5.1)
Sodium: 134 mEq/L — ABNORMAL LOW (ref 135–145)
Total Bilirubin: 0.8 mg/dL (ref 0.2–1.2)
Total Protein: 7.8 g/dL (ref 6.0–8.3)

## 2021-09-03 LAB — CBC
HCT: 37.9 % (ref 36.0–46.0)
Hemoglobin: 12.3 g/dL (ref 12.0–15.0)
MCHC: 32.4 g/dL (ref 30.0–36.0)
MCV: 87.9 fl (ref 78.0–100.0)
Platelets: 231 10*3/uL (ref 150.0–400.0)
RBC: 4.31 Mil/uL (ref 3.87–5.11)
RDW: 20.9 % — ABNORMAL HIGH (ref 11.5–15.5)
WBC: 11.5 10*3/uL — ABNORMAL HIGH (ref 4.0–10.5)

## 2021-09-03 LAB — BRAIN NATRIURETIC PEPTIDE: Pro B Natriuretic peptide (BNP): 123 pg/mL — ABNORMAL HIGH (ref 0.0–100.0)

## 2021-09-03 LAB — POC COVID19 BINAXNOW: SARS Coronavirus 2 Ag: NEGATIVE

## 2021-09-03 MED ORDER — METHYLPREDNISOLONE ACETATE 40 MG/ML IJ SUSP
40.0000 mg | Freq: Once | INTRAMUSCULAR | Status: AC
  Administered 2021-09-03: 40 mg via INTRAMUSCULAR

## 2021-09-03 MED ORDER — PREDNISONE 20 MG PO TABS: 40.0000 mg | ORAL_TABLET | Freq: Every day | ORAL | 0 refills | Status: AC

## 2021-09-03 NOTE — Assessment & Plan Note (Addendum)
Patient has a history of cirrhosis and is on furosemide presumably to help with fluid management.  Patient is having shortness of breath.  Wheezing audibly in office.  Did administer Depo-Medrol 40 mg IM x1.  We will do a prednisone taper.  Patient recently did finish Augmentin antibiotic from Dr. Earlie Server.  Chest x-ray did not show new infection did show persistent atelectasis/possible scarring from radiation therapy of lung mass and right lobe.  Patient's O2 sats within normal limits today did inform patient she is borderline going to the hospital as she is seeing short of breath.  Patient did not want to go to the hospital and wanted to continue with outpatient management precautions given when to seek emergency medical care.  Given history of lung cancer cannot totally exclude PE.  Patient has no history of blood clots.  Not tachycardic in office.  We will check other basic labs excluding D-dimer for now if high suspicion we will just CT

## 2021-09-03 NOTE — Progress Notes (Signed)
Acute Office Visit  Subjective:     Patient ID: KALYSE MEHARG, female    DOB: May 16, 1950, 71 y.o.   MRN: [id number]  Chief Complaint  Patient presents with   Cough    Chest pain maybe a week or more, SOB, wheezing. No fever. No headache. No Covid test done.      Patient is in today for cough  Symptoms started approx 1 week ago. States it could be more than a week. No offical dx of lung cancer, patient states she has not underwent official biopsy. States that she quick smoking yesterday. States that she was told that she has an infection in her lungs  Was given augmentin that she finished up on Sunday. States that she improved but then worsening on Tuesday. Dull ache aching pain that is all the time with an intermittent buring sensatoin. To the left upper/middle back  Radaition but no chemo because of her liver  No DVT or PE history  Patient states she has not taken any of her inhalers yet today because she wanted me to see how she truly was. Patient did take her albuterol inhaler in office which helped a moderate amount. I was not longer hearing her wheeze from across the room    Review of Systems  Constitutional:  Positive for malaise/fatigue. Negative for chills and fever.  Respiratory:  Positive for cough, sputum production (baseline), shortness of breath and wheezing.   Cardiovascular:  Positive for chest pain.  Gastrointestinal:  Negative for diarrhea, nausea and vomiting.  Neurological:  Negative for dizziness and headaches.       Objective:    BP 126/60   Pulse 89   Temp (!) 96.4 F (35.8 C) (Temporal)   Resp (!) 24   Wt 106 lb 9 oz (48.3 kg)   LMP  (LMP Unknown)   SpO2 98%   BMI 18.01 kg/m    Physical Exam Vitals and nursing note reviewed.  Constitutional:      Appearance: Normal appearance.  HENT:     Right Ear: Tympanic membrane, ear canal and external ear normal.     Left Ear: Tympanic membrane, ear canal and external ear normal.     Mouth/Throat:      Mouth: Mucous membranes are moist.     Pharynx: Oropharynx is clear. No posterior oropharyngeal erythema.  Cardiovascular:     Rate and Rhythm: Normal rate and regular rhythm.  Pulmonary:     Effort: Pulmonary effort is normal. No respiratory distress.     Breath sounds: Wheezing and rhonchi present.  Chest:     Chest wall: Tenderness present.  Abdominal:     General: Bowel sounds are normal.  Lymphadenopathy:     Cervical: No cervical adenopathy.  Neurological:     Mental Status: She is alert.    Results for orders placed or performed in visit on 09/03/21  CBC  Result Value Ref Range   WBC 11.5 (H) 4.0 - 10.5 K/uL   RBC 4.31 3.87 - 5.11 Mil/uL   Platelets 231.0 150.0 - 400.0 K/uL   Hemoglobin 12.3 12.0 - 15.0 g/dL   HCT 37.9 36.0 - 46.0 %   MCV 87.9 78.0 - 100.0 fl   MCHC 32.4 30.0 - 36.0 g/dL   RDW 20.9 (H) 11.5 - 15.5 %  Comprehensive metabolic panel  Result Value Ref Range   Sodium 134 (L) 135 - 145 mEq/L   Potassium 4.7 3.5 - 5.1 mEq/L   Chloride 97 96 -  112 mEq/L   CO2 29 19 - 32 mEq/L   Glucose, Bld 272 (H) 70 - 99 mg/dL   BUN 18 6 - 23 mg/dL   Creatinine, Ser 0.63 0.40 - 1.20 mg/dL   Total Bilirubin 0.8 0.2 - 1.2 mg/dL   Alkaline Phosphatase 84 39 - 117 U/L   AST 17 0 - 37 U/L   ALT 9 0 - 35 U/L   Total Protein 7.8 6.0 - 8.3 g/dL   Albumin 4.3 3.5 - 5.2 g/dL   GFR 89.69 >60.00 mL/min   Calcium 10.4 8.4 - 10.5 mg/dL  Brain natriuretic peptide  Result Value Ref Range   Pro B Natriuretic peptide (BNP) 123.0 (H) 0.0 - 100.0 pg/mL  POC COVID-19  Result Value Ref Range   SARS Coronavirus 2 Ag Negative Negative        Assessment & Plan:   Problem List Items Addressed This Visit       Respiratory   Pulmonary emphysema (HCC)   Relevant Medications   predniSONE (DELTASONE) 20 MG tablet   Other Relevant Orders   DG Chest 2 View (Completed)     Other   Tobacco abuse    States that she recently quit smoking tobacco as of yesterday.  Encourage  patient to keep up the good work       Wheezing    She did not take her maintenance inhaler or use her rescue inhaler today and she wanted me to see what she sounded like without them.  Patient did have audible wheezing across the room but when she took the appropriate actuations of her rescue inhaler wheezing did improve greatly and the chest tightness also improved per patient report.  Did administer Depo-Medrol 40 mg IM x1 dose in office.  Will write prednisone 40 mg taper over the next 5 days.  We will also do chest x-ray in office       Relevant Medications   predniSONE (DELTASONE) 20 MG tablet   Other Relevant Orders   DG Chest 2 View (Completed)   Other chest pain    Atypical chest pain in office.  Given patient does have history of lung nodule and diagnosis of lung cancer recently treated for infection could be multifactorial.  Patient will have radiation to her chest EKG within normal limits in office.  Chest x-ray came back see report.  Patient can take Tylenol limited as needed.  Has been using topical patches with some relief.  Continue to monitor see if prednisone is beneficial       Relevant Orders   EKG 12-Lead (Completed)   DG Chest 2 View (Completed)   CBC (Completed)   Comprehensive metabolic panel (Completed)   Brain natriuretic peptide (Completed)   Shortness of breath    Patient has a history of cirrhosis and is on furosemide presumably to help with fluid management.  Patient is having shortness of breath.  Wheezing audibly in office.  Did administer Depo-Medrol 40 mg IM x1.  We will do a prednisone taper.  Patient recently did finish Augmentin antibiotic from Dr. Earlie Server.  Chest x-ray did not show new infection did show persistent atelectasis/possible scarring from radiation therapy of lung mass and right lobe.  Patient's O2 sats within normal limits today did inform patient she is borderline going to the hospital as she is seeing short of breath.  Patient did not want  to go to the hospital and wanted to continue with outpatient management precautions given when to seek  emergency medical care.  Given history of lung cancer cannot totally exclude PE.  Patient has no history of blood clots.  Not tachycardic in office.  We will check other basic labs excluding D-dimer for now if high suspicion we will just CT        Relevant Medications   predniSONE (DELTASONE) 20 MG tablet   Other Relevant Orders   DG Chest 2 View (Completed)   CBC (Completed)   Comprehensive metabolic panel (Completed)   Brain natriuretic peptide (Completed)   Acute cough - Primary   Relevant Orders   POC COVID-19 (Completed)   DG Chest 2 View (Completed)    Meds ordered this encounter  Medications   methylPREDNISolone acetate (DEPO-MEDROL) injection 40 mg   predniSONE (DELTASONE) 20 MG tablet    Sig: Take 2 tablets (40 mg total) by mouth daily with breakfast for 5 days. Start on 09/04/2021    Dispense:  10 tablet    Refill:  0    Order Specific Question:   Supervising Provider    Answer:   Loura Pardon A [[id number]]    Return if symptoms worsen or fail to improve.  Romilda Garret, NP

## 2021-09-03 NOTE — Patient Instructions (Addendum)
Nice to see you today I sent in some steroids that you can take with food over the next 5 days. DO NOT take ibuprofen, naproxen, aleve, motrin, BC/Goody powders while taking this medication I will be in touch the chest xray results  EKG looked good in the office If you get worse the emergency room is an option

## 2021-09-03 NOTE — Assessment & Plan Note (Signed)
Atypical chest pain in office.  Given patient does have history of lung nodule and diagnosis of lung cancer recently treated for infection could be multifactorial.  Patient will have radiation to her chest EKG within normal limits in office.  Chest x-ray came back see report.  Patient can take Tylenol limited as needed.  Has been using topical patches with some relief.  Continue to monitor see if prednisone is beneficial

## 2021-09-03 NOTE — Assessment & Plan Note (Signed)
States that she recently quit smoking tobacco as of yesterday.  Encourage patient to keep up the good work

## 2021-09-03 NOTE — Assessment & Plan Note (Signed)
She did not take her maintenance inhaler or use her rescue inhaler today and she wanted me to see what she sounded like without them.  Patient did have audible wheezing across the room but when she took the appropriate actuations of her rescue inhaler wheezing did improve greatly and the chest tightness also improved per patient report.  Did administer Depo-Medrol 40 mg IM x1 dose in office.  Will write prednisone 40 mg taper over the next 5 days.  We will also do chest x-ray in office

## 2021-09-04 ENCOUNTER — Other Ambulatory Visit (INDEPENDENT_AMBULATORY_CARE_PROVIDER_SITE_OTHER): Payer: Medicare HMO

## 2021-09-04 DIAGNOSIS — R0789 Other chest pain: Secondary | ICD-10-CM | POA: Diagnosis not present

## 2021-09-04 LAB — SEDIMENTATION RATE: Sed Rate: 75 mm/hr — ABNORMAL HIGH (ref 0–30)

## 2021-09-04 LAB — C-REACTIVE PROTEIN: CRP: 7.7 mg/dL (ref 0.5–20.0)

## 2021-09-08 ENCOUNTER — Telehealth: Payer: Self-pay | Admitting: Pulmonary Disease

## 2021-09-08 DIAGNOSIS — M6281 Muscle weakness (generalized): Secondary | ICD-10-CM | POA: Diagnosis not present

## 2021-09-08 DIAGNOSIS — R0782 Intercostal pain: Secondary | ICD-10-CM | POA: Diagnosis not present

## 2021-09-08 DIAGNOSIS — M25512 Pain in left shoulder: Secondary | ICD-10-CM | POA: Diagnosis not present

## 2021-09-08 NOTE — Telephone Encounter (Signed)
Patient called requested to speak with you regarding the [facility]

## 2021-09-08 NOTE — Telephone Encounter (Signed)
Spoke to patient.  She stated that she is currently using Symbicort 2 puffs BID and albuterol PRN. She recently has COPD flare. Tx with prednisone by PCP. She has a f/u scheduled with PCP tomorrow.  She stated that she was giving three samples of spiriva. She stopped spiriva, as she felt that it was too strong for her and opened her up too much.  She tried spiriva this morning and it seems to be working well today. She would like to know if there is another inhaler that she could try.  Dr. Patsey Berthold, please advise. Thanks

## 2021-09-08 NOTE — Telephone Encounter (Signed)
If she felt that the Spiriva at the 2.5 mcg dose was too strong she can try just using 1 puff at a time or we can have her try the 1.25 mcg dose at 2 puffs daily.  Otherwise it would be Incruse 1 puff daily but this could also be "too strong" for her.

## 2021-09-08 NOTE — Telephone Encounter (Signed)
Spoke to patient and relayed below message.  She would like to try 1 puff of spiriva 2.5. she has one box left.  She will call back with update.  Nothing further needed at this time.

## 2021-09-09 ENCOUNTER — Ambulatory Visit (INDEPENDENT_AMBULATORY_CARE_PROVIDER_SITE_OTHER): Payer: Medicare HMO | Admitting: Nurse Practitioner

## 2021-09-09 ENCOUNTER — Encounter: Payer: Self-pay | Admitting: Nurse Practitioner

## 2021-09-09 VITALS — BP 124/60 | HR 76 | Temp 97.5°F | Resp 16 | Ht 64.5 in | Wt 108.2 lb

## 2021-09-09 DIAGNOSIS — R051 Acute cough: Secondary | ICD-10-CM

## 2021-09-09 DIAGNOSIS — R0781 Pleurodynia: Secondary | ICD-10-CM | POA: Insufficient documentation

## 2021-09-09 MED ORDER — GUAIFENESIN ER 600 MG PO TB12: 600.0000 mg | ORAL_TABLET | Freq: Two times a day (BID) | ORAL | 0 refills | Status: AC

## 2021-09-09 MED ORDER — BENZONATATE 100 MG PO CAPS: 100.0000 mg | ORAL_CAPSULE | Freq: Three times a day (TID) | ORAL | 0 refills | Status: AC | PRN

## 2021-09-09 NOTE — Telephone Encounter (Signed)
Patient has constipation. Discussed titration of the Lactulose to create 2 to 3 soft bowel movements each day. Patient expresses understanding and reports she remembers this conversation with Dr [name].

## 2021-09-09 NOTE — Patient Instructions (Signed)
Nice to see you today The pain in the rib cage is inflammatory and will take some time to go away. Continue taking tylenol like you have been and using the lidocaine patches.  Follow up if you do not continue to improve

## 2021-09-09 NOTE — Assessment & Plan Note (Signed)
Patient recently finished up Augmentin.  Chest x-ray was negative for acute infection last office visit.  We will give patient some Tessalon Perles along with some Mucinex to help thin out secretions.  She has already been seen by pulmonology continue following up as recommended

## 2021-09-09 NOTE — Progress Notes (Signed)
Acute Office Visit  Subjective:     Patient ID: Christine Barton, female    DOB: 30-Dec-1950, 71 y.o.   MRN: [id number]  Chief Complaint  Patient presents with   Follow-up    Chest pain and breathing is much better. She also got in touch with pulmonologist. Coughing up clear phlegm still, has chest congestion and still has pain in the upper rigt back side.    HPI Patient is in today for follow up on breathing and chest pain.  States that her your breathing is better. She is still coughin up phelgm no color to it. States she has been coughing up phelgm for a couple weeks. States that she was started on an abx and had to change abx to amoxicillin. She has completed the course. States that she is having right upper chest pain that is radiating across the chest. Burning pain occasionally. States it is a throbbing sensation. ALl the time and is no worse with movement. Has tried tylenol 2 times a day with some relief. Has tramadol for pain also. She has tried it and it did not help with . Has not tired the flexeril for the pain yet.  Currently going through [facility] and going to the [facility]  Review of Systems  Constitutional:  Negative for chills and fever.  Respiratory:  Positive for cough (clear sputum up) and shortness of breath (baseline).   Cardiovascular:  Negative for chest pain.  Musculoskeletal:  Positive for back pain.       Objective:    BP 124/60   Pulse 76   Temp (!) 97.5 F (36.4 C)   Resp 16   Ht 5' 4.5" (1.638 m)   Wt 108 lb 4 oz (49.1 kg)   LMP  (LMP Unknown)   SpO2 98%   BMI 18.29 kg/m    Physical Exam Vitals and nursing note reviewed.  Constitutional:      Appearance: Normal appearance.  Cardiovascular:     Rate and Rhythm: Normal rate and regular rhythm.  Pulmonary:     Effort: Pulmonary effort is normal.     Breath sounds: Rhonchi present.  Musculoskeletal:       Arms:     Thoracic back: No tenderness or bony tenderness.  Neurological:      Mental Status: She is alert.    No results found for any visits on 09/09/21.      Assessment & Plan:   Problem List Items Addressed This Visit       Other   Acute cough - Primary    Patient recently finished up Augmentin.  Chest x-ray was negative for acute infection last office visit.  We will give patient some Tessalon Perles along with some Mucinex to help thin out secretions.  She has already been seen by pulmonology continue following up as recommended       Relevant Medications   benzonatate (TESSALON) 100 MG capsule   guaiFENesin (MUCINEX) 600 MG 12 hr tablet   Rib pain on right side    Still c/o right sided rib cage pain. Think this is reactive and inflammatory.  Continue taking a lot of Tylenol along with tramadol as needed.  Patient can to using lidocaine patches do suspect this will self resolve        Meds ordered this encounter  Medications   benzonatate (TESSALON) 100 MG capsule    Sig: Take 1 capsule (100 mg total) by mouth 3 (three) times daily as needed  for up to 7 days for cough.    Dispense:  21 capsule    Refill:  0    Order Specific Question:   Supervising Provider    Answer:   Glori Bickers MARNE A [[id number]]   guaiFENesin (MUCINEX) 600 MG 12 hr tablet    Sig: Take 1 tablet (600 mg total) by mouth 2 (two) times daily for 7 days.    Dispense:  14 tablet    Refill:  0    Order Specific Question:   Supervising Provider    Answer:   TOWER, MARNE A [[id number]]    Return if symptoms worsen or fail to improve.  Romilda Garret, NP

## 2021-09-09 NOTE — Assessment & Plan Note (Signed)
Still c/o right sided rib cage pain. Think this is reactive and inflammatory.  Continue taking a lot of Tylenol along with tramadol as needed.  Patient can to using lidocaine patches do suspect this will self resolve

## 2021-09-10 ENCOUNTER — Other Ambulatory Visit: Payer: Self-pay | Admitting: Gastroenterology

## 2021-09-10 ENCOUNTER — Other Ambulatory Visit: Payer: Medicare HMO | Admitting: Student

## 2021-09-10 DIAGNOSIS — Z515 Encounter for palliative care: Secondary | ICD-10-CM

## 2021-09-10 DIAGNOSIS — C3431 Malignant neoplasm of lower lobe, right bronchus or lung: Secondary | ICD-10-CM | POA: Diagnosis not present

## 2021-09-10 DIAGNOSIS — R52 Pain, unspecified: Secondary | ICD-10-CM | POA: Diagnosis not present

## 2021-09-10 DIAGNOSIS — J449 Chronic obstructive pulmonary disease, unspecified: Secondary | ICD-10-CM | POA: Diagnosis not present

## 2021-09-10 NOTE — Progress Notes (Unsigned)
[facility] Palliative Care Consult Note Telephone: [phone]  Fax: [phone]    Date of encounter: 09/10/21 1:08 PM PATIENT NAME: Christine Barton [address] [place] [address]   [phone] (home)  DOB: 1950/07/13 MRN: [id number] PRIMARY CARE PROVIDER:    [name], MD,  [place] [address] [phone]  REFERRING PROVIDER:   [name], MD [address] [place],  [place] [address] [phone]  RESPONSIBLE PARTY:    Contact Information     Name Relation Home Work [place] Daughter [phone]  [phone]        I met face to face with patient in the home. Palliative Care was asked to follow this patient by consultation request of  Tower, [name], MD to address advance care planning and complex medical decision making. This is a follow up visit.                                   ASSESSMENT AND PLAN / RECOMMENDATIONS:   Advance Care Planning/Goals of Care: Goals include to maximize quality of life and symptom management. Patient/health care surrogate gave his/her permission to discuss. Our advance care planning conversation included a discussion about:    The value and importance of advance care planning  Experiences with loved ones who have been seriously ill or have died  Exploration of personal, cultural or spiritual beliefs that might influence medical decisions  Exploration of goals of care in the event of a sudden injury or illness  Identification of a healthcare agent  Review and updating or creation of an  advance directive document . Decision not to resuscitate or to de-escalate disease focused treatments due to poor prognosis. CODE STATUS:   [place]-follow up regarding Living Will.  Symptom Management/Plan:    Follow up Palliative Care Visit: Palliative care will continue to follow for complex medical decision making, advance care planning, and clarification of  goals. Return in 8 weeks or prn.  I spent 65 minutes providing this consultation. More than 50% of the time in this consultation was spent in counseling and care coordination.  This visit was coded based on medical decision making (MDM).***  PPS: ***0%  HOSPICE ELIGIBILITY/DIAGNOSIS: TBD  Chief Complaint: Palliative Medicine follow up visit.   HISTORY OF PRESENT ILLNESS:  Christine Barton is a 71 y.o. year old female  with *** . Breast cancer 1998, chemo not an option for liver cancer; radiation is only option.   Currently receiving outpatient therapy. Reports having several months of not feeling well but feels she is back to near her baseline. Left muscular abdomen pain, left upper chest, arm pain. Has arthritis, receiving injection. Pain to right ib pain, started bezonatate and guaifenesin. Has a productive cough; clear phlegm. No blood in phlegm. Cirrhosis 2020, follows up with GI.   History obtained from review of EMR, discussion with primary team, and interview with family, facility staff/caregiver and/or Christine Barton.  I reviewed available labs, medications, imaging, studies and related documents from the EMR.  Records reviewed and summarized above.   ROS  *** General: NAD EYES: denies vision changes ENMT: denies dysphagia Cardiovascular: denies chest pain, denies DOE Pulmonary: denies cough, denies increased SOB Abdomen: endorses good appetite, denies constipation, endorses continence of bowel GU: denies dysuria, endorses continence of urine MSK:  denies increased weakness,  no falls reported  Skin: denies rashes or wounds Neurological: denies pain, denies insomnia Psych: Endorses positive mood Heme/lymph/immuno: denies bruises, abnormal bleeding  Physical Exam:  Pulse 78, resp 16, sats 95% Constitutional: NAD General: frail appearing EYES: anicteric sclera, lids intact, no discharge  ENMT: intact hearing, oral mucous membranes moist, dentition intact CV: S1S2, RRR, no LE  edema Pulmonary: LCTA, no increased work of breathing, no cough, room air Abdomen: intake 100%, normo-active BS + 4 quadrants, soft and non tender, no ascites GU: deferred MSK: no sarcopenia, moves all extremities, ambulatory Skin: warm and dry, no rashes or wounds on visible skin Neuro:  no generalized weakness,  no cognitive impairment Psych: non-anxious affect, A and O x 3 Hem/lymph/immuno: no widespread bruising   Thank you for the opportunity to participate in the care of Christine Barton.  The palliative care team will continue to follow. Please call our office at [phone] if we can be of additional assistance.   Ezekiel Slocumb, NP   COVID-19 PATIENT SCREENING TOOL Asked and negative response unless otherwise noted:   Have you had symptoms of covid, tested positive or been in contact with someone with symptoms/positive test in the past 5-10 days?

## 2021-09-11 DIAGNOSIS — R0782 Intercostal pain: Secondary | ICD-10-CM | POA: Diagnosis not present

## 2021-09-11 DIAGNOSIS — M25512 Pain in left shoulder: Secondary | ICD-10-CM | POA: Diagnosis not present

## 2021-09-11 DIAGNOSIS — M6281 Muscle weakness (generalized): Secondary | ICD-10-CM | POA: Diagnosis not present

## 2021-09-14 DIAGNOSIS — M25512 Pain in left shoulder: Secondary | ICD-10-CM | POA: Diagnosis not present

## 2021-09-14 DIAGNOSIS — R0782 Intercostal pain: Secondary | ICD-10-CM | POA: Diagnosis not present

## 2021-09-14 DIAGNOSIS — M6281 Muscle weakness (generalized): Secondary | ICD-10-CM | POA: Diagnosis not present

## 2021-09-15 ENCOUNTER — Telehealth: Payer: Self-pay

## 2021-09-15 NOTE — Telephone Encounter (Signed)
Spoke with patient. Patient states she was looking at the notes after seen [facility] and wanted to see if this was a muscle issue or nerve issue or coming from lung? Patient did try lidocaine patches and she ended up having a lot of burning sensation in that area so she did not use it anymore. Patient was doing ok with the pain with taking Tylenol but then she did PT yesterday 09/14/21 and had to do a lot of motion with raising her arms and the pain is more inflamed now. Patient states if it is an inflammation can she take something that would help calm this down then instead of waiting and having continuous discomfort.

## 2021-09-15 NOTE — Telephone Encounter (Signed)
Patient is calling in stating that she wanted to know what Catalina Antigua found when he saw her last, wanting to know if it was nerve pain. Asked for a call back.

## 2021-09-16 NOTE — Telephone Encounter (Signed)
I will defer writing more steroids at this time. Patient can follow up with Dr. Glori Bickers and see what she feels is the best next step. I would continue with PT as that is for a different reason.

## 2021-09-16 NOTE — Telephone Encounter (Signed)
I do think that it is musculoskeletal in nature and especially given that therapy made it worse. I thought she told me that she was told not to take anti inflammatories like ibuprofen or motrin

## 2021-09-16 NOTE — Telephone Encounter (Signed)
Patient advised. Will follow up with Dr Glori Bickers on 09/18/21 further plan of care

## 2021-09-16 NOTE — Telephone Encounter (Signed)
Patient advised. Patient states that is correct that she is not able to take anti-inflammatory medication and has to be careful and not take Tylenol but 2 tablets at a time. Patient did ask about antibiotic-explained that antibiotic dose not treat inflammation if there is no infection. Patient understood. Patient states when she took steroids for her cough/sob when she saw Catalina Antigua a couple weeks ago and it helped the back pain as far her constant achy sensation she was having. Patient would like to see if she can try a short term prednisone again and see if it helps more this time around. Should patient continue PT exercises with the inflammation there now after recent exercise, and if so if it needs to be more focused on the back area where it hurts or to rest it more. Patient will keep her appointment with Dr Glori Bickers on 09/18/21 to follow up

## 2021-09-17 DIAGNOSIS — M6281 Muscle weakness (generalized): Secondary | ICD-10-CM | POA: Diagnosis not present

## 2021-09-17 DIAGNOSIS — R0782 Intercostal pain: Secondary | ICD-10-CM | POA: Diagnosis not present

## 2021-09-17 DIAGNOSIS — M25512 Pain in left shoulder: Secondary | ICD-10-CM | POA: Diagnosis not present

## 2021-09-18 ENCOUNTER — Ambulatory Visit: Payer: Medicare HMO | Admitting: Family Medicine

## 2021-09-18 NOTE — Telephone Encounter (Signed)
Spoke with patient. And she just wanted to confirm what we discussed the other day in regards to her muscle pain. Nothing further is needed. Patient is aware to follow up with Dr Glori Bickers for further plan of care

## 2021-09-18 NOTE — Telephone Encounter (Signed)
Patient is calling in wanting a returned call from [place].

## 2021-09-21 ENCOUNTER — Other Ambulatory Visit: Payer: Self-pay

## 2021-09-21 ENCOUNTER — Telehealth: Payer: Self-pay

## 2021-09-21 DIAGNOSIS — R0782 Intercostal pain: Secondary | ICD-10-CM | POA: Diagnosis not present

## 2021-09-21 DIAGNOSIS — M545 Low back pain, unspecified: Secondary | ICD-10-CM

## 2021-09-21 DIAGNOSIS — M25512 Pain in left shoulder: Secondary | ICD-10-CM | POA: Diagnosis not present

## 2021-09-21 DIAGNOSIS — M6281 Muscle weakness (generalized): Secondary | ICD-10-CM | POA: Diagnosis not present

## 2021-09-21 NOTE — Telephone Encounter (Signed)
Patient called stating she has finished PT for the left sided back pain but is now having right sided back pain and is wondering if she can get another round of PT ordered. [facility] would like it sent my Wednesday if possible

## 2021-09-21 NOTE — Telephone Encounter (Signed)
PT order has been placed.

## 2021-09-22 NOTE — Telephone Encounter (Signed)
Faxed referral and patient informed and very thankful for all our offices help

## 2021-09-23 ENCOUNTER — Ambulatory Visit: Payer: Medicare HMO | Admitting: Cardiology

## 2021-09-23 ENCOUNTER — Encounter: Payer: Self-pay | Admitting: Cardiology

## 2021-09-23 VITALS — BP 108/56 | HR 79 | Ht 64.5 in | Wt 106.6 lb

## 2021-09-23 DIAGNOSIS — I2510 Atherosclerotic heart disease of native coronary artery without angina pectoris: Secondary | ICD-10-CM | POA: Diagnosis not present

## 2021-09-23 DIAGNOSIS — R079 Chest pain, unspecified: Secondary | ICD-10-CM | POA: Diagnosis not present

## 2021-09-23 DIAGNOSIS — E1169 Type 2 diabetes mellitus with other specified complication: Secondary | ICD-10-CM | POA: Diagnosis not present

## 2021-09-23 DIAGNOSIS — E785 Hyperlipidemia, unspecified: Secondary | ICD-10-CM

## 2021-09-23 DIAGNOSIS — M25512 Pain in left shoulder: Secondary | ICD-10-CM | POA: Diagnosis not present

## 2021-09-23 DIAGNOSIS — R0782 Intercostal pain: Secondary | ICD-10-CM | POA: Diagnosis not present

## 2021-09-23 DIAGNOSIS — M6281 Muscle weakness (generalized): Secondary | ICD-10-CM | POA: Diagnosis not present

## 2021-09-23 NOTE — Patient Instructions (Signed)
Medication Instructions:  Your physician recommends that you continue on your current medications as directed. Please refer to the Current Medication list given to you today.  *If you need a refill on your cardiac medications before your next appointment, please call your pharmacy*  Follow-Up: At [facility], you and your health needs are our priority.  As part of our continuing mission to provide you with exceptional heart care, we have created designated Provider Care Teams.  These Care Teams include your primary Cardiologist (physician) and Advanced Practice Providers (APPs -  Physician Assistants and Nurse Practitioners) who all work together to provide you with the care you need, when you need it.  We recommend signing up for the patient portal called "MyChart".  Sign up information is provided on this After Visit Summary.  MyChart is used to connect with patients for Virtual Visits (Telemedicine).  Patients are able to view lab/test results, encounter notes, upcoming appointments, etc.  Non-urgent messages can be sent to your provider as well.   To learn more about what you can do with MyChart, go to [email or web address].    Your next appointment:   Pharmacist-lipid clinic  6 months with Dr. Gardiner Rhyme  Important Information About Sugar

## 2021-09-23 NOTE — Progress Notes (Signed)
Cardiology Office Note:    Date:  09/28/2021   ID:  Christine Barton, DOB 1951-01-22, MRN [id number]  PCP:  Christine Barton, Christine Barton  Cardiologist:  Christine Barton, Christine Barton  Electrophysiologist:  None   Referring Christine Barton: Christine Barton, Christine Barton   Chief Complaint  Patient presents with   Coronary Artery Disease    History of Present Illness:    Christine Barton is a 71 y.o. female with a hx of cirrhosis c/b esophageal varices, tobacco use, hyperlipidemia, type 2 diabetes who presents for follow-up.  She was referred by Dr. Glori Barton for evaluation of coronary artery calcifications seen on chest CT, with initial appointment on 02/13/2019.  She underwent a chest CT for lung cancer screening on 01/18/2019, which showed multivessel coronary artery calcifications.  Patient denies any chest pain.  States that her activity has been limited by back pain but has been doing physical therapy.  She does report intermittent dyspnea with exertion, which she states improves with inhaler use.  She continues to smoke 1 pack/day.  Had been taken off diuretics but had to restart.  TTE on 02/21/2019 showed normal LV systolic function, normal RV function, no significant valvular disease.  Lexiscan Myoview on 02/15/2020 showed EF 55%, normal perfusion.  Continued to have chest pain and coronary CTA was done on 06/08/2021, which showed calcium score 598 (94th percentile), diffuse calcified plaque causing mild stenosis, moderate stenosis in diagonal branch; CT FFR showed no significant stenosis.  Since her last clinic visit, she reports that she has been doing okay.  No further chest pain.  Denies any dyspnea, lightheadedness, syncope, lower extremity edema, or palpitations.  She is going to PT and [employer].  Denies any exertional symptoms.  Planning to quit smoking, down to 3 cigarettes/day.  Past Medical History:  Diagnosis Date   Alcohol abuse, unspecified    Breast cancer ([place]) 1998   Right   Cataract    left eye   Cervical  spondylosis 2006   MRI   Degeneration of cervical intervertebral disc 2006   MRI   Diabetes mellitus without complication ([place])    Diverticulosis of colon (without mention of hemorrhage)    Hyperpotassemia    Lung cancer ([place]) 03/2021   started radiation in January 2023, right side   Microscopic hematuria    Mononeuritis of unspecified site    Nonspecific abnormal results of liver function study    Other abnormal glucose    Other and unspecified hyperlipidemia    no per pt   Other chronic nonalcoholic liver disease    Personal history of chemotherapy    Personal history of malignant neoplasm of breast    Personal history of radiation therapy    Pneumothorax, acute    right, spontaneous   Tobacco use disorder    Unspecified vitamin D deficiency     Past Surgical History:  Procedure Laterality Date   BIOPSY  07/27/2019   Procedure: BIOPSY;  Surgeon: Christine Barton, Christine Barton;  Location: [facility];  Service: Gastroenterology;;   BREAST BIOPSY  9/03   Right   BREAST LUMPECTOMY Right 1998   CHEST TUBE INSERTION  11/02/2014   COLONOSCOPY     COLONOSCOPY WITH PROPOFOL N/A 07/27/2019   Procedure: COLONOSCOPY WITH PROPOFOL;  Surgeon: Christine Barton, Christine Barton;  Location: [facility];  Service: Gastroenterology;  Laterality: N/A;   ESOPHAGOGASTRODUODENOSCOPY (EGD) WITH PROPOFOL N/A 10/30/2018   Procedure: ESOPHAGOGASTRODUODENOSCOPY (EGD) WITH PROPOFOL;  Surgeon: Christine Barton, Christine Barton;  Location: [facility];  Service: Endoscopy;  Laterality: N/A;   ESOPHAGOGASTRODUODENOSCOPY (EGD) WITH PROPOFOL N/A 03/12/2019   Procedure: ESOPHAGOGASTRODUODENOSCOPY (EGD) WITH PROPOFOL;  Surgeon: Christine Barton, Christine Barton;  Location: [facility];  Service: Endoscopy;  Laterality: N/A;   ESOPHAGOGASTRODUODENOSCOPY (EGD) WITH PROPOFOL N/A 07/27/2019   Procedure: ESOPHAGOGASTRODUODENOSCOPY (EGD) WITH PROPOFOL;  Surgeon: Christine Barton, Christine Barton;  Location: [facility];  Service: Gastroenterology;   Laterality: N/A;   EYE SURGERY  02/2017   cataract extraction with lens implant-left   HOT HEMOSTASIS N/A 07/27/2019   Procedure: HOT HEMOSTASIS (ARGON PLASMA COAGULATION/BICAP);  Surgeon: Christine Barton, Christine Barton;  Location: [facility];  Service: Gastroenterology;  Laterality: N/A;   MOUTH SURGERY     POLYPECTOMY  07/27/2019   Procedure: POLYPECTOMY;  Surgeon: Christine Barton, Christine Barton;  Location: [facility];  Service: Gastroenterology;;   TUBAL LIGATION      Current Medications: Current Meds  Medication Sig   albuterol (VENTOLIN HFA) 108 (90 Base) MCG/ACT inhaler INHALE 2 PUFFS INTO THE LUNGS EVERY 4 HOURS AS NEEDED FOR WHEEZE OR FOR SHORTNESS OF BREATH   budesonide-formoterol (SYMBICORT) 160-4.5 MCG/ACT inhaler Inhale 2 puffs into the lungs 2 (two) times daily.   cholecalciferol (VITAMIN D3) 25 MCG (1000 UNIT) tablet Take 1,000 Units by mouth 2 (two) times daily.   clorazepate (TRANXENE) 3.75 MG tablet Take 3.75 mg by mouth 2 (two) times daily.   lactulose (CHRONULAC) 10 GM/15ML solution Take 15 mLs (10 g total) by mouth 3 (three) times daily.   metFORMIN (GLUCOPHAGE-XR) 500 MG 24 hr tablet TAKE 1 TABLET BY MOUTH EVERY DAY WITH BREAKFAST   Multiple Vitamin (MULTIVITAMIN) tablet Take 1 tablet by mouth daily.   Multiple Vitamins-Minerals (PRESERVISION AREDS 2) CAPS Take 1 capsule by mouth 2 (two) times daily.   pantoprazole (PROTONIX) 40 MG tablet TAKE 1 TABLET (40 MG TOTAL) BY MOUTH 2 (TWO) TIMES DAILY BEFORE A MEAL. (Patient taking differently: Take 40 mg by mouth daily.)   SYSTANE COMPLETE 0.6 % SOLN Apply 1 drop to eye 2 (two) times daily.   ursodiol (ACTIGALL) 300 MG capsule TAKE 1 CAPSULE BY MOUTH EVERY DAY   [DISCONTINUED] furosemide (LASIX) 20 MG tablet TAKE 1 TABLET BY MOUTH EVERY DAY   [DISCONTINUED] nadolol (CORGARD) 20 MG tablet TAKE 1 TABLET BY MOUTH EVERY DAY   [DISCONTINUED] XIFAXAN 550 MG TABS tablet TAKE 1 TABLET BY MOUTH TWICE A DAY     Allergies:   Oxycodone,  Crestor [rosuvastatin], Glipizide, and Prednisone   Social History   Socioeconomic History   Marital status: Single    Spouse name: Not on file   Number of children: 1   Years of education: Not on file   Highest education level: Not on file  Occupational History   Occupation: retired    Fish farm manager: [employer]  Tobacco Use   Smoking status: Every Day    Packs/day: 1.00    Years: 58.00    Total pack years: 58.00    Types: Cigarettes    Passive exposure: Past   Smokeless tobacco: Never   Tobacco comments:    0.5 ppd - 04/14/21    09/23/2021- Patient smokes 3 cigarettes daily   Vaping Use   Vaping Use: Never used  Substance and Sexual Activity   Alcohol use: Not Currently   Drug use: No   Sexual activity: Not Currently  Other Topics Concern   Not on file  Social History Narrative   Divorced      1 child  Works at [employer] Strain: Low Risk  (11/07/2020)   Overall Financial Resource Strain (CARDIA)    Difficulty of Paying Living Expenses: Not hard at all  Food Insecurity: No Food Insecurity (11/07/2020)   Hunger Vital Sign    Worried About Running Out of Food in the Last Year: Never true    [place] in the Last Year: Never true  Transportation Needs: No Transportation Needs (11/07/2020)   PRAPARE - Hydrologist (Medical): No    Lack of Transportation (Non-Medical): No  Physical Activity: Inactive (11/07/2020)   Exercise Vital Sign    Days of Exercise per Week: 0 days    Minutes of Exercise per Session: 0 min  Stress: No Stress Concern Present (11/07/2020)   [place]    Feeling of Stress : Not at all  Social Connections: Not on file     Family History: The patient's \ family history includes Colon cancer in her maternal uncle; Heart attack in her father; Heart failure in her father; Stroke  in her mother. There is no history of Esophageal cancer, Rectal cancer, Stomach cancer, or Pancreatic cancer.  ROS:   Please see the history of present illness.    \All other systems reviewed and are negative.  EKGs/Labs/Other Studies Reviewed:    The following studies were reviewed today:   EKG:   05/26/2021: Normal sinus rhythm, rate 71, no ST abnormalities  Recent Labs: 11/07/2020: TSH 3.77 09/03/2021: ALT 9; BUN 18; Creatinine, Ser 0.63; Hemoglobin 12.3; Platelets 231.0; Potassium 4.7; Pro B Natriuretic peptide (BNP) 123.0; Sodium 134  Recent Lipid Panel    Component Value Date/Time   CHOL 142 11/07/2020 0814   TRIG 124.0 11/07/2020 0814   HDL 34.80 (L) 11/07/2020 0814   CHOLHDL 4 11/07/2020 0814   VLDL 24.8 11/07/2020 0814   LDLCALC 82 11/07/2020 0814   LDLDIRECT 83.0 10/31/2017 0838    Physical Exam:    VS:  BP (!) 108/56 (BP Location: Left Arm, Patient Position: Sitting, Cuff Size: Normal)   Pulse 79   Ht 5' 4.5" (1.638 m)   Wt 106 lb 9.6 oz (48.4 kg)   LMP  (LMP Unknown)   SpO2 96%   BMI 18.02 kg/m     Wt Readings from Last 3 Encounters:  09/28/21 110 lb (49.9 kg)  09/23/21 106 lb 9.6 oz (48.4 kg)  09/09/21 108 lb 4 oz (49.1 kg)     GEN: Cachectic, in no acute distress HEENT: Normal NECK: No JVD CARDIAC: RRR, no murmurs RESPIRATORY:  Clear to auscultation without rales, wheezing or rhonchi  ABDOMEN: Soft, non-tender, non-distended MUSCULOSKELETAL:  No edema SKIN: Warm and dry NEUROLOGIC:  Alert and oriented x 3 PSYCHIATRIC:  Normal affect   ASSESSMENT:    1. Coronary artery disease involving native coronary artery of native heart without angina pectoris   2. Hyperlipidemia associated with type 2 diabetes mellitus (HCC)   3. Chest pain of uncertain etiology     PLAN:     Chest pain: Description suggest musculoskeletal pain, as reports continuous pain for days and worse with certain positions.  Also worse with palpation on exam.  She does have  coronary calcifications on CT chest and had Lexiscan Myoview 02/2020 with normal perfusion.  EKG unremarkable.  Coronary CTA was done on 06/08/2021, which showed calcium score 598 (  94th percentile), diffuse calcified plaque causing mild stenosis, moderate stenosis in diagonal branch; CT FFR showed no significant stenosis.  Coronary artery disease: CT chest showed coronary calcifications.  Lexiscan Myoview on 02/15/2020 showed EF 55%, normal perfusion.  TTE on 02/21/2019 showed no structural heart disease.  Coronary CTA was done on 06/08/2021, which showed calcium score 598 (94th percentile), diffuse calcified plaque causing mild stenosis, moderate stenosis in diagonal branch; CT FFR showed no significant stenosis. -She appears compensated in regards to her cirrhosis.  Discussed with her hepatologist, Dr. [name], and OK to start on statin.  Started on rosuvastatin 5 mg daily in 02/2019.  LDL 57 on 05/06/2020.  LFTs have been stable.  She stopped taking it because she thought statin was causing her abdominal pain.  Recommended restarting statin, patient declines, states that she will not take another statin.  Will refer to pharmacy lipid clinic to evaluate alternatives -Would not start on daily aspirin given issues with GI bleeding  Type 2 diabetes: Well-controlled, A1c is normal now 5.2% on 05/06/2020  Tobacco use: Patient counseled on risks of tobacco use and cessation strongly encouraged.  She reports she is down to 3 cigarettes/day, strongly encouraged cessation  Lung cancer: Found to have right upper lobe nodule that was positive on PET scan.  Not a surgical candidate.  Status post radiation in January 2023  Cachexia: Referred to nutrition  RTC in 6 months  Medication Adjustments/Labs and Tests Ordered: Current medicines are reviewed at length with the patient today.  Concerns regarding medicines are outlined above.  Orders Placed This Encounter  Procedures   AMB Referral to [facility]    No orders of the defined types were placed in this encounter.   Patient Instructions  Medication Instructions:  Your physician recommends that you continue on your current medications as directed. Please refer to the Current Medication list given to you today.  *If you need a refill on your cardiac medications before your next appointment, please call your pharmacy*  Follow-Up: At [facility], you and your health needs are our priority.  As part of our continuing mission to provide you with exceptional heart care, we have created designated Provider Care Teams.  These Care Teams include your primary Cardiologist (physician) and Advanced Practice Providers (APPs -  Physician Assistants and Nurse Practitioners) who all work together to provide you with the care you need, when you need it.  We recommend signing up for the patient portal called "MyChart".  Sign up information is provided on this After Visit Summary.  MyChart is used to connect with patients for Virtual Visits (Telemedicine).  Patients are able to view lab/test results, encounter notes, upcoming appointments, etc.  Non-urgent messages can be sent to your provider as well.   To learn more about what you can do with MyChart, go to [email or web address].    Your next appointment:   Pharmacist-lipid clinic  6 months with Dr. Gardiner Rhyme  Important Information About Sugar         Signed, Christine Barton, Christine Barton  09/28/2021 3:14 PM    [place]

## 2021-09-25 ENCOUNTER — Telehealth: Payer: Self-pay | Admitting: Pulmonary Disease

## 2021-09-25 ENCOUNTER — Other Ambulatory Visit: Payer: Self-pay | Admitting: Gastroenterology

## 2021-09-25 ENCOUNTER — Other Ambulatory Visit: Payer: Self-pay | Admitting: Family

## 2021-09-25 ENCOUNTER — Other Ambulatory Visit: Payer: Self-pay | Admitting: Family Medicine

## 2021-09-25 ENCOUNTER — Telehealth: Payer: Self-pay

## 2021-09-25 MED ORDER — SPIRIVA RESPIMAT 2.5 MCG/ACT IN AERS: 2.0000 | INHALATION_SPRAY | Freq: Every day | RESPIRATORY_TRACT | 0 refills | Status: DC

## 2021-09-25 MED ORDER — MUPIROCIN 2 % EX OINT
1.0000 | TOPICAL_OINTMENT | Freq: Two times a day (BID) | CUTANEOUS | 0 refills | Status: DC
Start: 2021-09-25 — End: 2021-10-09

## 2021-09-25 NOTE — Telephone Encounter (Signed)
Patient called stating that she started having pain in both lungs again, upper part, not as severe as it was when she came to see Romilda Garret on 09/03/21 but is still bothering patient. This started back again about 4 days ago. She did want to say that she is working with her pulmonologist on the inhalers and wonders if pain would get better once she gets situated with that. Patient was wondering what to do at this time with her symptoms. Dr Glori Bickers is not in the office. No appointment openings today.  Patient wonders if she needs more steroid treatment since it did help last time. She is at a "loss" of what to do at this time.

## 2021-09-25 NOTE — Telephone Encounter (Signed)
One month supply of spiriva 2.5 has been sent ot preferred pharmacy as requested by patient.  She voiced her understanding and had no further questions.  Nothing further needed.

## 2021-09-25 NOTE — Telephone Encounter (Signed)
I spoke with pt; pt notified as instructed and voiced understanding. pt said she is not in distress at this time; pt said she has not cked with her lung doctor about this but usually takes a while to get appt with pulmonology. Pt will contact lung dr office to see when she can get appt. Pt said if condition changed or worsened she would go to UC this weekend if needed. Pt did cancel appt with Dr Glori Bickers because she felt so much better after seeing Romilda Garret NP she did not think she needed to see Dr Glori Bickers; explained to pt since Dr Glori Bickers is her PCP and this has become an ongoing issue she does need to FU with Dr Glori Bickers to determine a further plan of care. Pt was scheduling appt with Dr Glori Bickers on 09/28/21 at 12 noon but after scheduled another pt had been scheduled at the same time so scheduled pt on 09/28/21 at 12:30.([place] CMA had said in the past could change 12:30 virtual to in office appt if needed. ) sending note to Dr Glori Bickers and Romilda Garret NP

## 2021-09-25 NOTE — Telephone Encounter (Signed)
Refill request Bactroban ointment No longer on medication list Last office visit 09/09/21 acute

## 2021-09-25 NOTE — Telephone Encounter (Signed)
I do not think steroids would be an appropriate option. Has she checked with her lung doctor about this and their opinion. If it becomes unbearable urgent care is an option. I see that she did not see Dr. Glori Bickers the other Friday?

## 2021-09-25 NOTE — Telephone Encounter (Signed)
Noted  

## 2021-09-28 ENCOUNTER — Ambulatory Visit (INDEPENDENT_AMBULATORY_CARE_PROVIDER_SITE_OTHER): Payer: Medicare HMO | Admitting: Family Medicine

## 2021-09-28 ENCOUNTER — Ambulatory Visit: Payer: Medicare HMO | Admitting: Family Medicine

## 2021-09-28 ENCOUNTER — Encounter: Payer: Self-pay | Admitting: Family Medicine

## 2021-09-28 VITALS — BP 108/64 | HR 80 | Ht 64.5 in | Wt 110.0 lb

## 2021-09-28 DIAGNOSIS — J441 Chronic obstructive pulmonary disease with (acute) exacerbation: Secondary | ICD-10-CM | POA: Diagnosis not present

## 2021-09-28 DIAGNOSIS — C3432 Malignant neoplasm of lower lobe, left bronchus or lung: Secondary | ICD-10-CM | POA: Diagnosis not present

## 2021-09-28 DIAGNOSIS — Z720 Tobacco use: Secondary | ICD-10-CM | POA: Diagnosis not present

## 2021-09-28 NOTE — Patient Instructions (Addendum)
Keep using your spiriva and synbicort   Stopping the cigarettes was the most important step, keep it up!  If your cough worsens or you become short of breath let us know  See the pulmonary specialist tomorrow as planned

## 2021-09-28 NOTE — Progress Notes (Signed)
Subjective:    Patient ID: Christine Barton, female    DOB: 09-17-1950, 71 y.o.   MRN: [id number]  HPI Pt presents with pain in chest and cough  Wt Readings from Last 3 Encounters:  09/28/21 110 lb (49.9 kg)  09/23/21 106 lb 9.6 oz (48.4 kg)  09/09/21 108 lb 4 oz (49.1 kg)   18.59 kg/m  Has copd and lung cancer  Had radiation treatment in January   Better than she was but not 100% back to normal yet   Some pain with breathing - upper part of her chest  Worse at night , hurts to take a deep breath  Cough - not a whole lot , phlegm if she has it is clear  No fever  Pulse ox today is 98%  Using spiriva  (helping a bit more)  Symbicort  Prednisone -had acutely   Still doing some PT for chest wall pain   Sees Dr Earlie Server for [facility] at end of May Took a course of augmentin  Tessalon   Cxr CLINICAL DATA:  History of lung infection/nodule. Chest pain for 1 week.   EXAM: CHEST - 2 VIEW   COMPARISON:  None Available.   FINDINGS: The heart size and mediastinal contours are within normal limits. Atherosclerotic calcification of the aortic arch. Right apical lung opacity, which may represent atelectasis or postradiation fibrosis. Lungs otherwise clear. Multiple surgical clips projecting over the left axilla. No acute osseous abnormality.   IMPRESSION: 1. Right apical lung opacity concerning for atelectasis or postradiation fibrotic changes. Lungs otherwise clear without evidence of focal consolidation or large pleural effusion.   2.  Cardiomediastinal silhouette is within normal limits.   Smoking status  4 days since last cig/wearing patch  H/o lung cancer   Otc Mucinex is helpful for cough  She took tessalon for a while     F/u witn onc in august  F/u with pulm tomorrow    Patient Active Problem List   Diagnosis Date Noted   Acute exacerbation of chronic obstructive pulmonary disease (COPD) ([place]) 09/28/2021   Rib pain on right side  09/09/2021   Pulmonary emphysema ([place]) 09/03/2021   Wheezing 09/03/2021   Other chest pain 09/03/2021   Shortness of breath 09/03/2021   Acute cough 09/03/2021   UTI (urinary tract infection) 08/04/2021   Dehydration 08/04/2021   Agatston CAC score, >400 07/02/2021   Left-sided chest wall pain 06/04/2021   Lung cancer ([place]) 02/25/2021   Iron deficiency anemia 01/01/2021   Iron deficiency anemia due to chronic blood loss 11/06/2020   AVM (arteriovenous malformation) of colon    Benign neoplasm of colon    NASH (nonalcoholic steatohepatitis)    Malnutrition ([place]) 07/25/2019   Symptomatic anemia 07/25/2019   Tobacco abuse 07/25/2019   Mass of soft tissue of face 03/06/2019   HSV infection 02/23/2019   Aortic atherosclerosis ([place]) 01/22/2019   Coronary atherosclerosis 01/22/2019   Encounter for screening for lung cancer 01/04/2019   Venous insufficiency 11/15/2018   Esophageal varices without bleeding ([place]) 10/27/2018   Anemia 10/23/2018   Fatigue 10/23/2018   Marasmus ([place]) 07/26/2018   Poor balance 05/30/2018   Cirrhosis of liver ([place]) 04/24/2018   Ascites 04/24/2018   Gallstones 04/24/2018   Heme positive stool 04/24/2018   Screening mammogram, encounter for 11/07/2017   Smoker 11/07/2017   Screening examination for STD (sexually transmitted disease) 11/11/2016   Welcome to Medicare preventive visit 10/26/2016   Estrogen  deficiency 10/26/2016   Colon cancer screening 11/01/2014   Encounter for routine gynecological examination 09/12/2013   Rapid heart beat 10/10/2012   Left shoulder pain 08/20/2011   Routine general medical examination at a health care facility 07/21/2011   Vitamin D deficiency 08/04/2009   POSTMENOPAUSAL STATUS 08/04/2009   Controlled diabetes mellitus type 2 with complications ([place]) 14/70/9295   Prediabetes 02/18/2009   Hyperlipidemia associated with type 2 diabetes mellitus ([place]) 06/14/2008   [place] DISEASE, CERVICAL 03/30/2007   History of alcohol  abuse 12/06/2006   Neuropathy of both feet 12/06/2006   DIVERTICULOSIS, COLON 12/06/2006   Fatty liver 12/06/2006   BREAST CANCER, HX OF 12/06/2006   Past Medical History:  Diagnosis Date   Alcohol abuse, unspecified    Breast cancer ([place]) 1998   Right   Cataract    left eye   Cervical spondylosis 2006   MRI   Degeneration of cervical intervertebral disc 2006   MRI   Diabetes mellitus without complication ([place])    Diverticulosis of colon (without mention of hemorrhage)    Hyperpotassemia    Lung cancer ([place]) 03/2021   started radiation in January 2023, right side   Microscopic hematuria    Mononeuritis of unspecified site    Nonspecific abnormal results of liver function study    Other abnormal glucose    Other and unspecified hyperlipidemia    no per pt   Other chronic nonalcoholic liver disease    Personal history of chemotherapy    Personal history of malignant neoplasm of breast    Personal history of radiation therapy    Pneumothorax, acute    right, spontaneous   Tobacco use disorder    Unspecified vitamin D deficiency    Past Surgical History:  Procedure Laterality Date   BIOPSY  07/27/2019   Procedure: BIOPSY;  Surgeon: Yetta Flock, MD;  Location: [facility];  Service: Gastroenterology;;   BREAST BIOPSY  9/03   Right   BREAST LUMPECTOMY Right 1998   CHEST TUBE INSERTION  11/02/2014   COLONOSCOPY     COLONOSCOPY WITH PROPOFOL N/A 07/27/2019   Procedure: COLONOSCOPY WITH PROPOFOL;  Surgeon: Yetta Flock, MD;  Location: [facility];  Service: Gastroenterology;  Laterality: N/A;   ESOPHAGOGASTRODUODENOSCOPY (EGD) WITH PROPOFOL N/A 10/30/2018   Procedure: ESOPHAGOGASTRODUODENOSCOPY (EGD) WITH PROPOFOL;  Surgeon: Mauri Pole, MD;  Location: [facility];  Service: Endoscopy;  Laterality: N/A;   ESOPHAGOGASTRODUODENOSCOPY (EGD) WITH PROPOFOL N/A 03/12/2019   Procedure: ESOPHAGOGASTRODUODENOSCOPY (EGD) WITH PROPOFOL;  Surgeon: Mauri Pole, MD;  Location: [facility];  Service: Endoscopy;  Laterality: N/A;   ESOPHAGOGASTRODUODENOSCOPY (EGD) WITH PROPOFOL N/A 07/27/2019   Procedure: ESOPHAGOGASTRODUODENOSCOPY (EGD) WITH PROPOFOL;  Surgeon: Yetta Flock, MD;  Location: [facility];  Service: Gastroenterology;  Laterality: N/A;   EYE SURGERY  02/2017   cataract extraction with lens implant-left   HOT HEMOSTASIS N/A 07/27/2019   Procedure: HOT HEMOSTASIS (ARGON PLASMA COAGULATION/BICAP);  Surgeon: Yetta Flock, MD;  Location: [facility];  Service: Gastroenterology;  Laterality: N/A;   MOUTH SURGERY     POLYPECTOMY  07/27/2019   Procedure: POLYPECTOMY;  Surgeon: Yetta Flock, MD;  Location: [facility];  Service: Gastroenterology;;   TUBAL LIGATION     Social History   Tobacco Use   Smoking status: Every Day    Packs/day: 1.00    Years: 58.00    Total pack years: 58.00    Types: Cigarettes    Passive exposure: Past   Smokeless  tobacco: Never   Tobacco comments:    0.5 ppd - 04/14/21    09/23/2021- Patient smokes 3 cigarettes daily   Vaping Use   Vaping Use: Never used  Substance Use Topics   Alcohol use: Not Currently   Drug use: No   Family History  Problem Relation Age of Onset   Heart failure Father    Heart attack Father    Colon cancer Maternal Uncle    Stroke Mother    Esophageal cancer Neg Hx    Rectal cancer Neg Hx    Stomach cancer Neg Hx    Pancreatic cancer Neg Hx    Allergies  Allergen Reactions   Oxycodone Nausea And Vomiting   Crestor [Rosuvastatin]     Abdominal pain     Glipizide Other (See Comments)    Stomach pain   Prednisone     GI upset and general malaise    Current Outpatient Medications on File Prior to Visit  Medication Sig Dispense Refill   albuterol (VENTOLIN HFA) 108 (90 Base) MCG/ACT inhaler INHALE 2 PUFFS INTO THE LUNGS EVERY 4 HOURS AS NEEDED FOR WHEEZE OR FOR SHORTNESS OF BREATH 18 each 3   budesonide-formoterol (SYMBICORT) 160-4.5  MCG/ACT inhaler Inhale 2 puffs into the lungs 2 (two) times daily.     cholecalciferol (VITAMIN D3) 25 MCG (1000 UNIT) tablet Take 1,000 Units by mouth 2 (two) times daily.     clorazepate (TRANXENE) 3.75 MG tablet Take 3.75 mg by mouth 2 (two) times daily.     cyclobenzaprine (FLEXERIL) 5 MG tablet Take 1 tablet (5 mg total) by mouth at bedtime. 1 tablet 0   furosemide (LASIX) 20 MG tablet TAKE 1 TABLET BY MOUTH EVERY DAY 90 tablet 1   lactulose (CHRONULAC) 10 GM/15ML solution Take 15 mLs (10 g total) by mouth 3 (three) times daily. 236 mL 11   metFORMIN (GLUCOPHAGE-XR) 500 MG 24 hr tablet TAKE 1 TABLET BY MOUTH EVERY DAY WITH BREAKFAST 90 tablet 1   Multiple Vitamin (MULTIVITAMIN) tablet Take 1 tablet by mouth daily.     Multiple Vitamins-Minerals (PRESERVISION AREDS 2) CAPS Take 1 capsule by mouth 2 (two) times daily.     mupirocin ointment (BACTROBAN) 2 % APPLY 1 APPLICATION TOPICALLY 2 (TWO) TIMES DAILY. TO AFFECTED AREAS 22 g 2   mupirocin ointment (BACTROBAN) 2 % Apply 1 Application topically 2 (two) times daily. 22 g 0   nadolol (CORGARD) 20 MG tablet TAKE 1 TABLET BY MOUTH EVERY DAY 90 tablet 3   nystatin (MYCOSTATIN) 100000 UNIT/ML suspension TAKE 5 MLS (500,000 UNITS TOTAL) BY MOUTH 3 (THREE) TIMES DAILY. SWISH AND SWALLOW 120 mL 0   pantoprazole (PROTONIX) 40 MG tablet TAKE 1 TABLET (40 MG TOTAL) BY MOUTH 2 (TWO) TIMES DAILY BEFORE A MEAL. (Patient taking differently: Take 40 mg by mouth daily.) 180 tablet 1   SYSTANE COMPLETE 0.6 % SOLN Apply 1 drop to eye 2 (two) times daily.     Tiotropium Bromide Monohydrate (SPIRIVA RESPIMAT) 2.5 MCG/ACT AERS Inhale 2 puffs into the lungs daily. 4 g 0   traMADol (ULTRAM) 50 MG tablet Take 1 tablet (50 mg total) by mouth every 8 (eight) hours as needed for severe pain. 15 tablet 0   ursodiol (ACTIGALL) 300 MG capsule TAKE 1 CAPSULE BY MOUTH EVERY DAY 30 capsule 1   XIFAXAN 550 MG TABS tablet TAKE 1 TABLET BY MOUTH TWICE A DAY 60 tablet 3   No  current facility-administered medications on file prior  to visit.     Review of Systems  Constitutional:  Positive for fatigue. Negative for activity change, appetite change, fever and unexpected weight change.  HENT:  Negative for congestion, ear pain, rhinorrhea, sinus pressure and sore throat.   Eyes:  Negative for pain, redness and visual disturbance.  Respiratory:  Positive for cough, chest tightness and wheezing. Negative for shortness of breath and stridor.        Less wheezing   Cardiovascular:  Negative for chest pain and palpitations.  Gastrointestinal:  Negative for abdominal pain, blood in stool, constipation and diarrhea.  Endocrine: Negative for polydipsia and polyuria.  Genitourinary:  Negative for dysuria, frequency and urgency.  Musculoskeletal:  Negative for arthralgias, back pain and myalgias.  Skin:  Negative for pallor and rash.  Allergic/Immunologic: Negative for environmental allergies.  Neurological:  Negative for dizziness, syncope and headaches.  Hematological:  Negative for adenopathy. Does not bruise/bleed easily.  Psychiatric/Behavioral:  Negative for decreased concentration and dysphoric mood. The patient is not nervous/anxious.        Objective:   Physical Exam Constitutional:      General: She is not in acute distress.    Appearance: Normal appearance. She is well-developed. She is not ill-appearing.     Comments: Underweight   HENT:     Head: Normocephalic and atraumatic.     Right Ear: Tympanic membrane and ear canal normal. There is no impacted cerumen.     Left Ear: Tympanic membrane and ear canal normal. There is no impacted cerumen.     Nose:     Comments: Boggy nares    Mouth/Throat:     Mouth: Mucous membranes are moist.     Pharynx: Oropharynx is clear. No posterior oropharyngeal erythema.  Eyes:     General: No scleral icterus.    Conjunctiva/sclera: Conjunctivae normal.     Pupils: Pupils are equal, round, and reactive to light.   Neck:     Thyroid: No thyromegaly.     Vascular: No carotid bruit or JVD.  Cardiovascular:     Rate and Rhythm: Normal rate and regular rhythm.     Heart sounds: Normal heart sounds.     No gallop.  Pulmonary:     Effort: Pulmonary effort is normal. No respiratory distress.     Breath sounds: Normal breath sounds. No wheezing or rales.     Comments: Diffusely distant bs Scant wheeze on forced exp only   Some mild ant chest wall tenderness Abdominal:     General: There is no distension or abdominal bruit.     Palpations: Abdomen is soft.  Musculoskeletal:     Cervical back: Normal range of motion and neck supple.     Right lower leg: No edema.     Left lower leg: No edema.  Lymphadenopathy:     Cervical: No cervical adenopathy.  Skin:    General: Skin is warm and dry.     Coloration: Skin is not jaundiced or pale.     Findings: No bruising, erythema or rash.  Neurological:     Mental Status: She is alert.     Coordination: Coordination normal.     Deep Tendon Reflexes: Reflexes are normal and symmetric. Reflexes normal.  Psychiatric:        Mood and Affect: Mood normal.           Assessment & Plan:   Problem List Items Addressed This Visit       Respiratory   Acute  exacerbation of chronic obstructive pulmonary disease (COPD) ([place]) - Primary    Recently seen by Romilda Garret and taken augmentin and prednisone Clinically improving  Cough is less and pleuritic cp is less  Rad tx for lung cancer may have added  Commended for quitting smoking  Reassuring exam and pulse ox today  On spiriva and symbicort For f/u with pulmonary tomorrow       Lung cancer [facility])    Has had radiation tx  oncol f/u is in august  Unsure if scarring adds to her pleuritic pain at times  Overall improved        Other   Tobacco abuse

## 2021-09-28 NOTE — Assessment & Plan Note (Signed)
Has had radiation tx  oncol f/u is in august  Unsure if scarring adds to her pleuritic pain at times  Overall improved

## 2021-09-28 NOTE — Assessment & Plan Note (Signed)
Recently seen by Romilda Garret and taken augmentin and prednisone Clinically improving  Cough is less and pleuritic cp is less  Rad tx for lung cancer may have added  Commended for quitting smoking  Reassuring exam and pulse ox today  On spiriva and symbicort For f/u with pulmonary tomorrow

## 2021-09-29 ENCOUNTER — Encounter: Payer: Self-pay | Admitting: Pulmonary Disease

## 2021-09-29 ENCOUNTER — Ambulatory Visit: Payer: Medicare HMO | Admitting: Pulmonary Disease

## 2021-09-29 VITALS — BP 120/70 | HR 93 | Temp 97.8°F | Ht 64.5 in | Wt 110.6 lb

## 2021-09-29 DIAGNOSIS — B370 Candidal stomatitis: Secondary | ICD-10-CM

## 2021-09-29 DIAGNOSIS — J432 Centrilobular emphysema: Secondary | ICD-10-CM | POA: Diagnosis not present

## 2021-09-29 DIAGNOSIS — C3411 Malignant neoplasm of upper lobe, right bronchus or lung: Secondary | ICD-10-CM | POA: Diagnosis not present

## 2021-09-29 DIAGNOSIS — D500 Iron deficiency anemia secondary to blood loss (chronic): Secondary | ICD-10-CM

## 2021-09-29 DIAGNOSIS — K7030 Alcoholic cirrhosis of liver without ascites: Secondary | ICD-10-CM | POA: Diagnosis not present

## 2021-09-29 MED ORDER — SPIRIVA RESPIMAT 2.5 MCG/ACT IN AERS: 2.0000 | INHALATION_SPRAY | Freq: Every day | RESPIRATORY_TRACT | 11 refills | Status: DC

## 2021-09-29 MED ORDER — FLUCONAZOLE 100 MG PO TABS: 100.0000 mg | ORAL_TABLET | Freq: Every day | ORAL | 0 refills | Status: AC

## 2021-09-29 NOTE — Progress Notes (Signed)
Subjective:    Patient ID: Christine Barton, female    DOB: 1950/05/05, 71 y.o.   MRN: [id number] Patient Care Team: Judy Pimple, MD as PCP - General Little Ishikawa, MD as PCP - Cardiology (Cardiology) Kathyrn Sheriff, [facility] as Pharmacist (Pharmacist)  Chief Complaint  Patient presents with   Follow-up    SOB with exertion and dry cough.     HPI The patient is a 71 year old current smoker (1 PPD) who presents for follow-up of an 8 mm right upper lobe nodule found incidentally on low-dose CT chest during lung cancer screening.  She was initially evaluated on 16 February 2021.  The patient had had this abnormality followed previously imaged in October 2021 at that time being only 3.7 mm.  The nodule is on the same area of prior radiation changes breast cancer noted in the right lung.  PET/CT performed on 11 February 2021 showed FDG avidity in this nodule.  There was no evidence of other abnormal uptake in the chest with the exception of the aforementioned area of scarring.  The patient opted to forego invasive procedures and go directly to SBRT due to her multiple comorbidities.  She completed SBRT to this area.  She tolerated this well.  He has been monitored closely for potential recurrence.  She had a CT chest performed 03 August 2021 showing residua of radiation but no evidence of recurrence.  A follow-up CT in 3 months is recommended as active infection or inflammation cannot be excluded.  She will be due for follow-up on this in August.  At her visit in April 2023 she was still using Symbicort however had difficulties with continue recurrent thrush.  And was switched to Spiriva 2.5 mcg 2 puffs daily.  She has difficulty with inhalers containing ICS due to recurrent thrush.  Currently she feels that she still has some residual thrush.  She had been provided nystatin swish and swallow but this had not been effective.  She has not had any fevers, chills or sweats.  No chest pain.  Weight  is stable.  She has had no anorexia.  No lower extremity edema nor calf tenderness.  Does not endorse any other symptomatology.   She notes Spiriva is helping her shortness of breath.  Review of Systems A 10 point review of systems was performed and it is as noted above otherwise negative.  Patient Active Problem List   Diagnosis Date Noted   Osteopenia 06/17/2022   Cat bite of upper arm, left, initial encounter 12/23/2021   Pulmonary emphysema (HCC) 09/03/2021   Shortness of breath 09/03/2021   Agatston CAC score, >400 07/02/2021   Left-sided chest wall pain 06/04/2021   Lung cancer (HCC) 02/25/2021   Iron deficiency anemia 01/01/2021   Iron deficiency anemia due to chronic blood loss 11/06/2020   AVM (arteriovenous malformation) of colon    Benign neoplasm of colon    NASH (nonalcoholic steatohepatitis)    Malnutrition (HCC) 07/25/2019   HSV infection 02/23/2019   Aortic atherosclerosis (HCC) 01/22/2019   Coronary atherosclerosis 01/22/2019   Encounter for screening for lung cancer 01/04/2019   Venous insufficiency 11/15/2018   Esophageal varices without bleeding (HCC) 10/27/2018   Anemia 10/23/2018   Marasmus (HCC) 07/26/2018   Poor balance 05/30/2018   Cirrhosis of liver (HCC) 04/24/2018   Gallstones 04/24/2018   Heme positive stool 04/24/2018   Screening mammogram, encounter for 11/07/2017   Smoker 11/07/2017   Screening examination for STD (sexually transmitted disease)  11/11/2016   Welcome to Medicare preventive visit 10/26/2016   Estrogen deficiency 10/26/2016   Colon cancer screening 11/01/2014   Encounter for routine gynecological examination 09/12/2013   Left shoulder pain 08/20/2011   Routine general medical examination at a health care facility 07/21/2011   Vitamin D deficiency 08/04/2009   POSTMENOPAUSAL STATUS 08/04/2009   Controlled diabetes mellitus type 2 with complications (HCC) 02/18/2009   Hyperlipidemia associated with type 2 diabetes mellitus (HCC)  06/14/2008   DISC DISEASE, CERVICAL 03/30/2007   History of alcohol abuse 12/06/2006   Neuropathy of both feet 12/06/2006   DIVERTICULOSIS, COLON 12/06/2006   Fatty liver 12/06/2006   BREAST CANCER, HX OF 12/06/2006   Social History   Tobacco Use   Smoking status: Every Day    Packs/day: 1.00    Years: 58.00    Additional pack years: 0.00    Total pack years: 58.00    Types: Cigarettes    Start date: 09/25/2021    Passive exposure: Past   Smokeless tobacco: Never   Tobacco comments:        1PPD   Substance Use Topics   Alcohol use: Not Currently   Allergies  Allergen Reactions   Anoro Ellipta [Umeclidinium-Vilanterol] Cough   Bevespi Aerosphere [Glycopyrrolate-Formoterol] Cough    Prolonged coughing episode with first dose   Oxycodone Nausea And Vomiting   Crestor [Rosuvastatin]     Abdominal pain     Glipizide Other (See Comments)    Stomach pain   Prednisone     GI upset and general malaise    Current Meds  Medication Sig   cholecalciferol (VITAMIN D3) 25 MCG (1000 UNIT) tablet Take 1,000 Units by mouth 2 (two) times daily.   Multiple Vitamin (MULTIVITAMIN) tablet Take 1 tablet by mouth daily.   Multiple Vitamins-Minerals (PRESERVISION AREDS 2) CAPS Take 1 capsule by mouth 2 (two) times daily.   SYSTANE COMPLETE 0.6 % SOLN Apply 1 drop to eye 2 (two) times daily.   albuterol (VENTOLIN HFA) 108 (90 Base) MCG/ACT inhaler INHALE 2 PUFFS INTO THE LUNGS EVERY 4 HOURS AS NEEDED FOR WHEEZE OR FOR SHORTNESS OF BREATH   [DISCONTINUED] budesonide-formoterol (SYMBICORT) 160-4.5 MCG/ACT inhaler Inhale 2 puffs into the lungs 2 (two) times daily.   clorazepate (TRANXENE) 3.75 MG tablet Take 3.75 mg by mouth 2 (two) times daily.   cyclobenzaprine (FLEXERIL) 5 MG tablet Take 1 tablet (5 mg total) by mouth at bedtime. (Patient not taking: Reported on 07/15/2022)   furosemide (LASIX) 20 MG tablet TAKE 1 TABLET BY MOUTH EVERY DAY   lactulose (CHRONULAC) 10 GM/15ML solution Take 15 mLs  (10 g total) by mouth 3 (three) times daily.   metFORMIN (GLUCOPHAGE-XR) 500 MG 24 hr tablet TAKE 1 TABLET BY MOUTH EVERY DAY WITH BREAKFAST   nadolol (CORGARD) 20 MG tablet TAKE 1 TABLET BY MOUTH EVERY DAY   pantoprazole (PROTONIX) 40 MG tablet TAKE 1 TABLET (40 MG TOTAL) BY MOUTH 2 (TWO) TIMES DAILY BEFORE A MEAL. (Patient taking differently: Take 40 mg by mouth daily.)   Tiotropium Bromide Monohydrate (SPIRIVA RESPIMAT) 2.5 MCG/ACT AERS Inhale 2 puffs into the lungs daily.   traMADol (ULTRAM) 50 MG tablet Take 1 tablet (50 mg total) by mouth every 8 (eight) hours as needed for severe pain. (Patient not taking: Reported on 07/15/2022)   ursodiol (ACTIGALL) 300 MG capsule TAKE 1 CAPSULE BY MOUTH EVERY DAY   XIFAXAN 550 MG TABS tablet TAKE 1 TABLET BY MOUTH TWICE A DAY  Immunization History  Administered Date(s) Administered   Fluad Quad(high Dose 65+) 12/04/2018, 01/19/2020, 12/23/2021   Hep A / Hep B 09/05/2018, 10/10/2018, 03/29/2019   Influenza Split 03/10/2011   Influenza Whole 02/11/2000, 02/06/2016   Influenza, High Dose Seasonal PF 02/03/2021   Influenza,inj,Quad PF,6+ Mos 02/16/2017, 04/18/2018   Influenza-Unspecified 02/11/2016   PFIZER(Purple Top)SARS-COV-2 Vaccination 05/21/2019, 06/16/2019, 02/13/2020   Pfizer Covid-19 Vaccine Bivalent Booster 37yrs & up 02/10/2021   Pneumococcal Conjugate-13 10/26/2016   Pneumococcal Polysaccharide-23 06/28/2008, 11/07/2017   Respiratory Syncytial Virus Vaccine,Recomb Aduvanted(Arexvy) 05/18/2022   Td 05/21/2002   Tdap 09/12/2013   Zoster Recombinat (Shingrix) 06/18/2022   Zoster, Live 08/24/2011       Objective:   Physical Exam BP 120/70 (BP Location: Left Arm, Cuff Size: Normal)   Pulse 93   Temp 97.8 F (36.6 C) (Temporal)   Ht 5' 4.5" (1.638 m)   Wt 110 lb 9.6 oz (50.2 kg)   LMP  (LMP Unknown)   SpO2 100%   BMI 18.69 kg/m   SpO2: 100 % O2 Device: None (Room air)  GENERAL: Thin, frail woman, no acute distress, fully  ambulatory, no conversational dyspnea. HEAD: Normocephalic, atraumatic.  EYES: Pupils equal, round, reactive to light.  No scleral icterus.  MOUTH: Poor dentition, thrush noted. NECK: Supple. No thyromegaly. Trachea midline. No JVD.  No adenopathy. PULMONARY: Good air entry bilaterally.  Coarse breath sounds, diffuse end expiratory wheezes. CARDIOVASCULAR: S1 and S2. Regular rate and rhythm.  ABDOMEN: Scaphoid otherwise benign. MUSCULOSKELETAL: No joint deformity, no clubbing, no edema.  NEUROLOGIC: Grossly nonfocal.  Speech fluent. SKIN: Intact,warm,dry. PSYCH: Mood and behavior normal.      Assessment & Plan:     ICD-10-CM   1. Centrilobular emphysema (HCC)  J43.2    Spiriva 2.5 mcg 2 puffs daily Continue as needed albuterol Avoid inhalers containing ICS    2. Malignant neoplasm of upper lobe of right lung [facility])  C34.11    Most recent imaging shows no recurrence Follow-up imaging will be due August    3. Alcoholic cirrhosis of liver without ascites (HCC)  K70.30    This issue adds complexity to her management Continue management per GI/primary care    4. Iron deficiency anemia due to chronic blood loss  D50.0    This issue adds complexity to her management May add to her sensation of dyspnea    5. Thrush  B37.0    Will treat with fluconazole Avoid ICS containing inhalers     Meds ordered this encounter  Medications   fluconazole (DIFLUCAN) 100 MG tablet    Sig: Take 1 tablet (100 mg total) by mouth daily for 7 days.    Dispense:  7 tablet    Refill:  0   Tiotropium Bromide Monohydrate (SPIRIVA RESPIMAT) 2.5 MCG/ACT AERS    Sig: Inhale 2 puffs into the lungs daily.    Dispense:  4 g    Refill:  11   Will see the patient in follow-up in 4 months time she is to contact us prior to that time should any new difficulties arise.  Gailen Shelter, MD Advanced Bronchoscopy PCCM [facility]-    *This note was dictated using voice recognition  software/Dragon.  Despite best efforts to proofread, errors can occur which can change the meaning. Any transcriptional errors that result from this process are unintentional and may not be fully corrected at the time of dictation.

## 2021-09-29 NOTE — Patient Instructions (Addendum)
We sent in the prescriptions to your pharmacy.  We will see you in follow-up in 4 months time call sooner should any new problems arise.

## 2021-09-30 ENCOUNTER — Encounter: Payer: Self-pay | Admitting: Pharmacist

## 2021-09-30 ENCOUNTER — Ambulatory Visit: Payer: Medicare HMO | Admitting: Pharmacist

## 2021-09-30 DIAGNOSIS — I2510 Atherosclerotic heart disease of native coronary artery without angina pectoris: Secondary | ICD-10-CM | POA: Diagnosis not present

## 2021-09-30 DIAGNOSIS — M25512 Pain in left shoulder: Secondary | ICD-10-CM | POA: Diagnosis not present

## 2021-09-30 DIAGNOSIS — R931 Abnormal findings on diagnostic imaging of heart and coronary circulation: Secondary | ICD-10-CM

## 2021-09-30 DIAGNOSIS — E785 Hyperlipidemia, unspecified: Secondary | ICD-10-CM | POA: Diagnosis not present

## 2021-09-30 DIAGNOSIS — E1169 Type 2 diabetes mellitus with other specified complication: Secondary | ICD-10-CM

## 2021-09-30 DIAGNOSIS — M6281 Muscle weakness (generalized): Secondary | ICD-10-CM | POA: Diagnosis not present

## 2021-09-30 DIAGNOSIS — R0782 Intercostal pain: Secondary | ICD-10-CM | POA: Diagnosis not present

## 2021-09-30 NOTE — Progress Notes (Signed)
Patient ID: [name]                 DOB: 1950-09-25                    MRN: [id number]     HPI: Christine Barton is a 71 y.o. female patient referred to lipid clinic by Dr Gardiner Rhyme. PMH is significant for cirrhosis, tobacco use, lung cancer, T2DM, HLD, and elevated coronary calcium score.  Patient presents today to discuss cholesterol and coronary artery disease. Has not had lipid panel updated since July 2022.  LDL was well controlled when patient was on rosuvastatin however she developed abdominal pain which she reports was due to statin use. Looked up symptoms on Youtube. Has been resistant to starting other medications since.   Current Medications: N/A  Intolerances: Rosuvastatin (abdominal pain?)  Risk Factors:  CAD Smoking T2DM Elevated coronary calcium  LDL goal: <55  Social History: 58 pack year smoking history  Labs: TC 142, trigs 124, HDL 34.8, LDL 82 (11/07/20)  Coronary CTA was done on 06/08/2021, which showed calcium score 598 (94th percentile), diffuse calcified plaque causing mild stenosis, moderate stenosis in diagonal branch; CT FFR showed no significant stenosis.  Past Medical History:  Diagnosis Date   Alcohol abuse, unspecified    Breast cancer ([place]) 1998   Right   Cataract    left eye   Cervical spondylosis 2006   MRI   Degeneration of cervical intervertebral disc 2006   MRI   Diabetes mellitus without complication ([facility])    Diverticulosis of colon (without mention of hemorrhage)    Hyperpotassemia    Lung cancer ([place]) 03/2021   started radiation in January 2023, right side   Microscopic hematuria    Mononeuritis of unspecified site    Nonspecific abnormal results of liver function study    Other abnormal glucose    Other and unspecified hyperlipidemia    no per pt   Other chronic nonalcoholic liver disease    Personal history of chemotherapy    Personal history of malignant neoplasm of breast    Personal history of radiation therapy     Pneumothorax, acute    right, spontaneous   Tobacco use disorder    Unspecified vitamin D deficiency     Current Outpatient Medications on File Prior to Visit  Medication Sig Dispense Refill   albuterol (VENTOLIN HFA) 108 (90 Base) MCG/ACT inhaler INHALE 2 PUFFS INTO THE LUNGS EVERY 4 HOURS AS NEEDED FOR WHEEZE OR FOR SHORTNESS OF BREATH 18 each 3   cholecalciferol (VITAMIN D3) 25 MCG (1000 UNIT) tablet Take 1,000 Units by mouth 2 (two) times daily.     clorazepate (TRANXENE) 3.75 MG tablet Take 3.75 mg by mouth 2 (two) times daily.     cyclobenzaprine (FLEXERIL) 5 MG tablet Take 1 tablet (5 mg total) by mouth at bedtime. 1 tablet 0   fluconazole (DIFLUCAN) 100 MG tablet Take 1 tablet (100 mg total) by mouth daily for 7 days. 7 tablet 0   furosemide (LASIX) 20 MG tablet TAKE 1 TABLET BY MOUTH EVERY DAY 90 tablet 1   lactulose (CHRONULAC) 10 GM/15ML solution Take 15 mLs (10 g total) by mouth 3 (three) times daily. 236 mL 11   metFORMIN (GLUCOPHAGE-XR) 500 MG 24 hr tablet TAKE 1 TABLET BY MOUTH EVERY DAY WITH BREAKFAST 90 tablet 1   Multiple Vitamin (MULTIVITAMIN) tablet Take 1 tablet by mouth daily.     Multiple Vitamins-Minerals (PRESERVISION  AREDS 2) CAPS Take 1 capsule by mouth 2 (two) times daily.     mupirocin ointment (BACTROBAN) 2 % APPLY 1 APPLICATION TOPICALLY 2 (TWO) TIMES DAILY. TO AFFECTED AREAS 22 g 2   mupirocin ointment (BACTROBAN) 2 % Apply 1 Application topically 2 (two) times daily. 22 g 0   nadolol (CORGARD) 20 MG tablet TAKE 1 TABLET BY MOUTH EVERY DAY 90 tablet 3   nystatin (MYCOSTATIN) 100000 UNIT/ML suspension TAKE 5 MLS (500,000 UNITS TOTAL) BY MOUTH 3 (THREE) TIMES DAILY. SWISH AND SWALLOW 120 mL 0   pantoprazole (PROTONIX) 40 MG tablet TAKE 1 TABLET (40 MG TOTAL) BY MOUTH 2 (TWO) TIMES DAILY BEFORE A MEAL. (Patient taking differently: Take 40 mg by mouth daily.) 180 tablet 1   SYSTANE COMPLETE 0.6 % SOLN Apply 1 drop to eye 2 (two) times daily.     Tiotropium Bromide  Monohydrate (SPIRIVA RESPIMAT) 2.5 MCG/ACT AERS Inhale 2 puffs into the lungs daily. 4 g 11   traMADol (ULTRAM) 50 MG tablet Take 1 tablet (50 mg total) by mouth every 8 (eight) hours as needed for severe pain. 15 tablet 0   ursodiol (ACTIGALL) 300 MG capsule TAKE 1 CAPSULE BY MOUTH EVERY DAY 30 capsule 1   XIFAXAN 550 MG TABS tablet TAKE 1 TABLET BY MOUTH TWICE A DAY 60 tablet 3   No current facility-administered medications on file prior to visit.    Allergies  Allergen Reactions   Oxycodone Nausea And Vomiting   Crestor [Rosuvastatin]     Abdominal pain     Glipizide Other (See Comments)    Stomach pain   Prednisone     GI upset and general malaise     Assessment/Plan:  1. Hyperlipidemia - Patient's most recent LDL 82 which is above goal of <55.  Aggressive goal selected due to multiple risk factors including elevated coronary calcium, smoking history and T2DM.  Lipid panel drawn nearly 1 year ago however. Unclear what current LDL is. Offered to update lipid panel however patient declines.  Educated on next pharmacologic steps in lipid management including trying a different statin such as Lipitor, adding Zetia, or starting PCSK9i.  Patient had multiple questions regarding mechanism of action and whether they were metabolized in her liver.  Advised that she was high risk for CV event. After discussion, patient expressed interest in [place].  Using demo pen, educated patient on mechanism of action and played educational videos.  Discussed storage, site selection, administration, and possible adverse effects.  Patient willing to start but would like me to confirm with Dr [name] and Dr Glori Bickers that medication would be safe. Advised it should be but I would be happy to send them a message.  Patient appreciative. Will complete PA when response is received and then schedule follow up lipid panel for 2-3 months.  Patient voiced understanding.  Karren Cobble, PharmD, BCACP, [place], [place], [place], [place], [address] Phone: [phone], Fax: [phone]

## 2021-09-30 NOTE — Patient Instructions (Addendum)
It was nice meeting you today  We would like your LDL (bad cholesterol) to be less than 55  We would like to start a new medication called Repatha which you will inject once every 2 weeks  I will complete the prior authorization for you and contact you when it is approved  Once you start the medication we will recheck your cholesterol in 2-3 months  I will also send a message to your PCP and GI doctors  Please call with any questions  Karren Cobble, PharmD, [place], [place], [place], [place], [place], [address] Phone: [phone], Fax: [phone]

## 2021-10-01 ENCOUNTER — Telehealth: Payer: Self-pay | Admitting: Internal Medicine

## 2021-10-01 NOTE — Telephone Encounter (Signed)
Called patient regarding upcoming August appointments, patient has been called and notified.

## 2021-10-02 ENCOUNTER — Telehealth: Payer: Self-pay | Admitting: Pulmonary Disease

## 2021-10-02 NOTE — Telephone Encounter (Signed)
She can resume Symbicort. Needs to rinse mouth well after use.

## 2021-10-06 MED ORDER — BUDESONIDE-FORMOTEROL FUMARATE 160-4.5 MCG/ACT IN AERO: 2.0000 | INHALATION_SPRAY | Freq: Two times a day (BID) | RESPIRATORY_TRACT | 6 refills | Status: DC

## 2021-10-07 ENCOUNTER — Other Ambulatory Visit: Payer: Self-pay | Admitting: Family Medicine

## 2021-10-09 ENCOUNTER — Ambulatory Visit: Payer: Medicare HMO | Admitting: Gastroenterology

## 2021-10-09 ENCOUNTER — Other Ambulatory Visit (INDEPENDENT_AMBULATORY_CARE_PROVIDER_SITE_OTHER): Payer: Medicare HMO

## 2021-10-09 ENCOUNTER — Encounter: Payer: Self-pay | Admitting: Gastroenterology

## 2021-10-09 VITALS — BP 124/62 | HR 65 | Ht 64.5 in | Wt 111.0 lb

## 2021-10-09 DIAGNOSIS — K7030 Alcoholic cirrhosis of liver without ascites: Secondary | ICD-10-CM

## 2021-10-09 DIAGNOSIS — K760 Fatty (change of) liver, not elsewhere classified: Secondary | ICD-10-CM | POA: Diagnosis not present

## 2021-10-09 DIAGNOSIS — K7682 Hepatic encephalopathy: Secondary | ICD-10-CM

## 2021-10-09 DIAGNOSIS — K5904 Chronic idiopathic constipation: Secondary | ICD-10-CM | POA: Diagnosis not present

## 2021-10-09 LAB — HEPATIC FUNCTION PANEL
ALT: 7 U/L (ref 0–35)
AST: 14 U/L (ref 0–37)
Albumin: 4.1 g/dL (ref 3.5–5.2)
Alkaline Phosphatase: 89 U/L (ref 39–117)
Bilirubin, Direct: 0.1 mg/dL (ref 0.0–0.3)
Total Bilirubin: 0.4 mg/dL (ref 0.2–1.2)
Total Protein: 7.5 g/dL (ref 6.0–8.3)

## 2021-10-09 LAB — PROTIME-INR
INR: 1.1 ratio — ABNORMAL HIGH (ref 0.8–1.0)
Prothrombin Time: 11.8 s (ref 9.6–13.1)

## 2021-10-09 MED ORDER — LACTULOSE 10 GM/15ML PO SOLN: 10.0000 g | Freq: Three times a day (TID) | ORAL | 11 refills | Status: DC

## 2021-10-09 MED ORDER — RIFAXIMIN 550 MG PO TABS: 550.0000 mg | ORAL_TABLET | Freq: Two times a day (BID) | ORAL | 3 refills | Status: DC

## 2021-10-09 NOTE — Patient Instructions (Addendum)
Your provider has requested that you go to the basement level for lab work before leaving today. Press "B" on the elevator. The lab is located at the first door on the left as you exit the elevator.   Increase water intake to 8 cups a day  We have sent the following medications to your pharmacy for you to pick up at your convenience:  Lactulose   Xifaxian   Refills   Follow up in 3 months, you will need to call back to this appointment  If you are age 71 or older, your body mass index should be between 23-30. Your Body mass index is 18.76 kg/m. If this is out of the aforementioned range listed, please consider follow up with your Primary Care Provider.  If you are age 7 or younger, your body mass index should be between 19-25. Your Body mass index is 18.76 kg/m. If this is out of the aformentioned range listed, please consider follow up with your Primary Care Provider.   ________________________________________________________  The [facility] providers would like to encourage you to use [facility] to communicate with providers for non-urgent requests or questions.  Due to long hold times on the telephone, sending your provider a message by [facility] may be a faster and more efficient way to get a response.  Please allow 48 business hours for a response.  Please remember that this is for non-urgent requests.  _______________________________________________________   Due to recent changes in healthcare laws, you may see the results of your imaging and laboratory studies on MyChart before your provider has had a chance to review them.  We understand that in some cases there may be results that are confusing or concerning to you. Not all laboratory results come back in the same time frame and the provider may be waiting for multiple results in order to interpret others.  Please give [place] 48 hours in order for your provider to thoroughly review all the results before contacting the office for clarification  of your results.    I appreciate the  opportunity to care for you  Thank You   Harl Bowie , MD

## 2021-10-09 NOTE — Progress Notes (Signed)
Christine Barton    [id number]    [birth year]  Primary Care Physician:Tower, Audrie Gallus, MD  Referring Physician: Tower, Audrie Gallus, MD [address] [place],  [place] [address]   Chief complaint:  Cirrhosis  HPI:  72 year old very pleasant female with history of breast cancer, lung cancer, type 2 diabetes, COPD, chronic anemia secondary to occult GI blood loss and decompensated cirrhosis here for follow-up visit   Left side rib pain and abdominal pain is improving since she is doing physical therapy.  She completed radiation therapy for lung cancer   She is experiencing intermittent constipation.  P.o. intake is improving   Denies any nausea, vomiting, melena or bright red blood per rectum.  No other episodes of confusion, lower extremity edema or worsening abdominal distention.     She is receiving IV iron infusion, hemoglobin on saturation.  She is being managed by hematology   MRI abdomen/liver December 31, 2020 1. Cirrhosis. No liver masses. 2. Trace perihepatic ascites.  Normal size spleen. 3. Cholelithiasis. Chronic mild central intrahepatic and extrahepatic biliary ductal dilatation, unchanged. CBD diameter 9 mm. No evidence of choledocholithiasis. 4. Chronic mild dilation of the dorsal pancreatic duct in the pancreatic head, unchanged, nonspecific. No pancreatic mass.   Abdominal ultrasound April 30, 2020 1. Cirrhotic liver morphology.  No focal liver lesion is identified. 2. Cholelithiasis with a positive sonographic Murphy sign. Findings raise suspicion for acute cholecystitis.   Normal AFP 3.7 in January 2022 MRI liver 01-08-2020: Stable cirrhosis with no evidence of hepatocellular carcinoma. Choledocholithiasis with stable CBD. Increased iron deposition in the setting of IV iron therapy   EGD July 27, 2019 by Dr. Adela Lank: Small esophageal varices and portal hypertensive gastropathy otherwise normal exam   Colonoscopy July 27, 2019: 8 small  AVMs scattered in the colon ablated with APC and 3 small polyps removed, diverticulosis and internal hemorrhoids   EGD February 23, 2019: Small grade 1 esophageal varices, portal hypertensive gastropathy   Colonoscopy November 30, 2016: Diverticulosis, sessile polyps X4 tubular adenomas and internal hemorrhoids.  Recall colonoscopy in 3 years          Outpatient Encounter Medications as of 10/09/2021  Medication Sig   albuterol (VENTOLIN HFA) 108 (90 Base) MCG/ACT inhaler INHALE 2 PUFFS INTO THE LUNGS EVERY 4 HOURS AS NEEDED FOR WHEEZE OR FOR SHORTNESS OF BREATH   budesonide-formoterol (SYMBICORT) 160-4.5 MCG/ACT inhaler Inhale 2 puffs into the lungs in the morning and at bedtime.   cholecalciferol (VITAMIN D3) 25 MCG (1000 UNIT) tablet Take 1,000 Units by mouth 2 (two) times daily.   clorazepate (TRANXENE) 3.75 MG tablet Take 3.75 mg by mouth 2 (two) times daily.   cyclobenzaprine (FLEXERIL) 5 MG tablet Take 1 tablet (5 mg total) by mouth at bedtime.   furosemide (LASIX) 20 MG tablet TAKE 1 TABLET BY MOUTH EVERY DAY   lactulose (CHRONULAC) 10 GM/15ML solution Take 15 mLs (10 g total) by mouth 3 (three) times daily.   metFORMIN (GLUCOPHAGE-XR) 500 MG 24 hr tablet TAKE 1 TABLET BY MOUTH EVERY DAY WITH BREAKFAST   Multiple Vitamin (MULTIVITAMIN) tablet Take 1 tablet by mouth daily.   Multiple Vitamins-Minerals (PRESERVISION AREDS 2) CAPS Take 1 capsule by mouth 2 (two) times daily.   mupirocin ointment (BACTROBAN) 2 % APPLY 1 APPLICATION TOPICALLY 2 (TWO) TIMES DAILY. TO AFFECTED AREAS   nadolol (CORGARD) 20 MG tablet TAKE 1 TABLET BY MOUTH EVERY DAY   nystatin (  MYCOSTATIN) 100000 UNIT/ML suspension TAKE 5 MLS (500,000 UNITS TOTAL) BY MOUTH 3 (THREE) TIMES DAILY. SWISH AND SWALLOW   pantoprazole (PROTONIX) 40 MG tablet TAKE 1 TABLET (40 MG TOTAL) BY MOUTH 2 (TWO) TIMES DAILY BEFORE A MEAL. (Patient taking differently: Take 40 mg by mouth daily.)   SYSTANE COMPLETE 0.6 % SOLN Apply 1 drop to eye  2 (two) times daily.   traMADol (ULTRAM) 50 MG tablet Take 1 tablet (50 mg total) by mouth every 8 (eight) hours as needed for severe pain.   ursodiol (ACTIGALL) 300 MG capsule TAKE 1 CAPSULE BY MOUTH EVERY DAY   XIFAXAN 550 MG TABS tablet TAKE 1 TABLET BY MOUTH TWICE A DAY   [DISCONTINUED] mupirocin ointment (BACTROBAN) 2 % Apply 1 Application topically 2 (two) times daily.   No facility-administered encounter medications on file as of 10/09/2021.    Allergies as of 10/09/2021 - Review Complete 10/09/2021  Allergen Reaction Noted   Oxycodone Nausea And Vomiting 11/02/2014   Crestor [rosuvastatin]  11/12/2020   Glipizide Other (See Comments) 09/12/2013   Prednisone  06/15/2021    Past Medical History:  Diagnosis Date   Alcohol abuse, unspecified    Breast cancer (HCC) 1998   Right   Cataract    left eye   Cervical spondylosis 2006   MRI   Degeneration of cervical intervertebral disc 2006   MRI   Diabetes mellitus without complication (HCC)    Diverticulosis of colon (without mention of hemorrhage)    Hyperpotassemia    Lung cancer (HCC) 03/2021   started radiation in January 2023, right side   Microscopic hematuria    Mononeuritis of unspecified site    Nonspecific abnormal results of liver function study    Other abnormal glucose    Other and unspecified hyperlipidemia    no per pt   Other chronic nonalcoholic liver disease    Personal history of chemotherapy    Personal history of malignant neoplasm of breast    Personal history of radiation therapy    Pneumothorax, acute    right, spontaneous   Tobacco use disorder    Unspecified vitamin D deficiency     Past Surgical History:  Procedure Laterality Date   BIOPSY  07/27/2019   Procedure: BIOPSY;  Surgeon: Benancio Deeds, MD;  Location: [facility];  Service: Gastroenterology;;   BREAST BIOPSY  9/03   Right   BREAST LUMPECTOMY Right 1998   CHEST TUBE INSERTION  11/02/2014   COLONOSCOPY     COLONOSCOPY  WITH PROPOFOL N/A 07/27/2019   Procedure: COLONOSCOPY WITH PROPOFOL;  Surgeon: Benancio Deeds, MD;  Location: [facility];  Service: Gastroenterology;  Laterality: N/A;   ESOPHAGOGASTRODUODENOSCOPY (EGD) WITH PROPOFOL N/A 10/30/2018   Procedure: ESOPHAGOGASTRODUODENOSCOPY (EGD) WITH PROPOFOL;  Surgeon: Napoleon Form, MD;  Location: [facility];  Service: Endoscopy;  Laterality: N/A;   ESOPHAGOGASTRODUODENOSCOPY (EGD) WITH PROPOFOL N/A 03/12/2019   Procedure: ESOPHAGOGASTRODUODENOSCOPY (EGD) WITH PROPOFOL;  Surgeon: Napoleon Form, MD;  Location: [facility];  Service: Endoscopy;  Laterality: N/A;   ESOPHAGOGASTRODUODENOSCOPY (EGD) WITH PROPOFOL N/A 07/27/2019   Procedure: ESOPHAGOGASTRODUODENOSCOPY (EGD) WITH PROPOFOL;  Surgeon: Benancio Deeds, MD;  Location: [facility];  Service: Gastroenterology;  Laterality: N/A;   EYE SURGERY  02/2017   cataract extraction with lens implant-left   HOT HEMOSTASIS N/A 07/27/2019   Procedure: HOT HEMOSTASIS (ARGON PLASMA COAGULATION/BICAP);  Surgeon: Benancio Deeds, MD;  Location: [facility];  Service: Gastroenterology;  Laterality: N/A;   MOUTH SURGERY  POLYPECTOMY  07/27/2019   Procedure: POLYPECTOMY;  Surgeon: Benancio Deeds, MD;  Location: [facility];  Service: Gastroenterology;;   TUBAL LIGATION      Family History  Problem Relation Age of Onset   Heart failure Father    Heart attack Father    Colon cancer Maternal Uncle    Stroke Mother    Esophageal cancer Neg Hx    Rectal cancer Neg Hx    Stomach cancer Neg Hx    Pancreatic cancer Neg Hx     Social History   Socioeconomic History   Marital status: Single    Spouse name: Not on file   Number of children: 1   Years of education: Not on file   Highest education level: Not on file  Occupational History   Occupation: retired    Associate Professor: [employer]  Tobacco Use   Smoking status: Former    Packs/day: 1.00    Years: 58.00    Total pack  years: 58.00    Types: Cigarettes    Start date: 09/25/2021    Passive exposure: Past   Smokeless tobacco: Never   Tobacco comments:    0.5 ppd - 04/14/21    09/23/2021- Patient smokes 3 cigarettes daily   Vaping Use   Vaping Use: Never used  Substance and Sexual Activity   Alcohol use: Not Currently   Drug use: No   Sexual activity: Not Currently  Other Topics Concern   Not on file  Social History Narrative   Divorced      1 child      Works at [employer]         Social Determinants of Health   Financial Resource Strain: Low Risk  (11/07/2020)   Overall Financial Resource Strain (CARDIA)    Difficulty of Paying Living Expenses: Not hard at all  Food Insecurity: No Food Insecurity (11/07/2020)   Hunger Vital Sign    Worried About Running Out of Food in the Last Year: Never true    Ran Out of Food in the Last Year: Never true  Transportation Needs: No Transportation Needs (11/07/2020)   PRAPARE - Administrator, Civil Service (Medical): No    Lack of Transportation (Non-Medical): No  Physical Activity: Inactive (11/07/2020)   Exercise Vital Sign    Days of Exercise per Week: 0 days    Minutes of Exercise per Session: 0 min  Stress: No Stress Concern Present (11/07/2020)   [employer] of Occupational Health - Occupational Stress Questionnaire    Feeling of Stress : Not at all  Social Connections: Not on file  Intimate Partner Violence: Not At Risk (11/07/2020)   Humiliation, Afraid, Rape, and Kick questionnaire    Fear of Current or Ex-Partner: No    Emotionally Abused: No    Physically Abused: No    Sexually Abused: No      Review of systems: All other review of systems negative except as mentioned in the HPI.   Physical Exam: Vitals:   10/09/21 0849  BP: 124/62  Pulse: 65   Body mass index is 18.76 kg/m. Gen:      No acute distress HEENT:  sclera anicteric Abd:      soft, non-tender; no palpable masses, no distension Ext:    No  edema Neuro: alert and oriented x 3 Psych: normal mood and affect  Data Reviewed:  Reviewed labs, radiology imaging, old records and pertinent past GI work up   Assessment and  Plan/Recommendations:  71 year old very pleasant female with history of NASH and EtOH cirrhosis     MELD-Na: 7    Chronic idiopathic constipation: Use lactulose twice daily.  She already has the prescription for hepatic encephalopathy Increase dietary fiber and water intake   No clinical evidence of hepatic decompensation Overall stable from cirrhosis standpoint   Left rib pain/costochondritis arthritis: History of lung cancer s/p radiation treatment continue physical therapy Ligament pulmonary nodule, undergoing radiation therapy     Epigastric abdominal pain: Secondary to large symptomatic gallstone, she is not a surgical candidate Continue ursodiol 300 mg daily monitor symptoms   Iron deficiency anemia: Improved with IV iron infusion Chronic GI blood loss secondary to small bowel AVMs Continue to monitor CBC, iron panel and IV iron infusion as needed.  She is being managed by hematology   Hepatic encephalopathy: Stable Continue Xifaxan and advised patient to take lactulose as needed if she has change in mood or gait.  Also advised her to take lactulose if she has less than 2 or 3 soft bowel movements per day   No evidence of volume overload or ascites on exam, continue low-dose furosemide Advised patient to stop spironolactone, noted elevated potassium 5.3 on prior labs   [facility] [place] screening: No lesions concerning for HCC.  Follow-up AFP Due for repeat imaging in September 2023  Discussed smoking cessation   Return in 3 months or sooner if needed  This visit required >40 minutes of patient care (this includes precharting, chart review, review of results, face-to-face time used for counseling as well as treatment plan and follow-up. The patient was provided an opportunity to ask questions and all were  answered. The patient agreed with the plan and demonstrated an understanding of the instructions.  Iona Beard , MD    CC: Tower, Audrie Gallus, MD

## 2021-10-12 ENCOUNTER — Encounter: Payer: Self-pay | Admitting: Gastroenterology

## 2021-10-14 DIAGNOSIS — M6281 Muscle weakness (generalized): Secondary | ICD-10-CM | POA: Diagnosis not present

## 2021-10-14 DIAGNOSIS — R0782 Intercostal pain: Secondary | ICD-10-CM | POA: Diagnosis not present

## 2021-10-14 DIAGNOSIS — M25512 Pain in left shoulder: Secondary | ICD-10-CM | POA: Diagnosis not present

## 2021-10-30 ENCOUNTER — Other Ambulatory Visit: Payer: Self-pay | Admitting: Family Medicine

## 2021-10-30 DIAGNOSIS — Z1231 Encounter for screening mammogram for malignant neoplasm of breast: Secondary | ICD-10-CM

## 2021-11-02 ENCOUNTER — Other Ambulatory Visit: Payer: Medicare HMO | Admitting: Student

## 2021-11-05 ENCOUNTER — Other Ambulatory Visit: Payer: Self-pay

## 2021-11-05 ENCOUNTER — Telehealth: Payer: Self-pay | Admitting: Gastroenterology

## 2021-11-05 DIAGNOSIS — K7030 Alcoholic cirrhosis of liver without ascites: Secondary | ICD-10-CM

## 2021-11-05 DIAGNOSIS — K760 Fatty (change of) liver, not elsewhere classified: Secondary | ICD-10-CM

## 2021-11-05 NOTE — Telephone Encounter (Signed)
Yes, thanks

## 2021-11-05 NOTE — Telephone Encounter (Signed)
Tried to return call. Phone rings then goes to a fast busy signal. Orders for her MRI sent to Radiology scheduling. They will reach out to her to schedule the MRI. Follow up appointment with Dr [name] on October 6 at 2:10 pm scheduled. Appointment card mailed.

## 2021-11-05 NOTE — Telephone Encounter (Signed)
Is it okay to schedule the MRI abd w/wo attn liver? I can get her scheduled for her follow up in October.

## 2021-11-10 ENCOUNTER — Other Ambulatory Visit: Payer: Medicare HMO | Admitting: Student

## 2021-11-10 ENCOUNTER — Other Ambulatory Visit: Payer: Self-pay | Admitting: Gastroenterology

## 2021-11-10 DIAGNOSIS — M79605 Pain in left leg: Secondary | ICD-10-CM | POA: Diagnosis not present

## 2021-11-10 DIAGNOSIS — S8012XA Contusion of left lower leg, initial encounter: Secondary | ICD-10-CM | POA: Diagnosis not present

## 2021-11-17 ENCOUNTER — Ambulatory Visit
Admission: RE | Admit: 2021-11-17 | Discharge: 2021-11-17 | Disposition: A | Discharge status: Home / Self Care | Payer: Medicare HMO | Source: Ambulatory Visit | Attending: Family Medicine | Admitting: Family Medicine

## 2021-11-17 DIAGNOSIS — Z1231 Encounter for screening mammogram for malignant neoplasm of breast: Secondary | ICD-10-CM

## 2021-11-20 ENCOUNTER — Other Ambulatory Visit: Payer: Self-pay

## 2021-11-20 ENCOUNTER — Inpatient Hospital Stay: Payer: Medicare HMO | Attending: Physician Assistant

## 2021-11-20 ENCOUNTER — Ambulatory Visit (HOSPITAL_COMMUNITY)
Admission: RE | Admit: 2021-11-20 | Discharge: 2021-11-20 | Disposition: A | Discharge status: Home / Self Care | Payer: Medicare HMO | Source: Ambulatory Visit | Attending: Internal Medicine | Admitting: Internal Medicine

## 2021-11-20 ENCOUNTER — Other Ambulatory Visit: Payer: Medicare HMO | Admitting: Student

## 2021-11-20 DIAGNOSIS — C3490 Malignant neoplasm of unspecified part of unspecified bronchus or lung: Secondary | ICD-10-CM

## 2021-11-20 DIAGNOSIS — R531 Weakness: Secondary | ICD-10-CM | POA: Insufficient documentation

## 2021-11-20 DIAGNOSIS — K5521 Angiodysplasia of colon with hemorrhage: Secondary | ICD-10-CM | POA: Insufficient documentation

## 2021-11-20 DIAGNOSIS — R911 Solitary pulmonary nodule: Secondary | ICD-10-CM | POA: Diagnosis not present

## 2021-11-20 DIAGNOSIS — D500 Iron deficiency anemia secondary to blood loss (chronic): Secondary | ICD-10-CM | POA: Insufficient documentation

## 2021-11-20 DIAGNOSIS — J439 Emphysema, unspecified: Secondary | ICD-10-CM | POA: Diagnosis not present

## 2021-11-20 DIAGNOSIS — R5383 Other fatigue: Secondary | ICD-10-CM | POA: Insufficient documentation

## 2021-11-20 LAB — CMP (CANCER CENTER ONLY)
ALT: 10 U/L (ref 0–44)
AST: 17 U/L (ref 15–41)
Albumin: 4.2 g/dL (ref 3.5–5.0)
Alkaline Phosphatase: 74 U/L (ref 38–126)
Anion gap: 6 (ref 5–15)
BUN: 13 mg/dL (ref 8–23)
CO2: 26 mmol/L (ref 22–32)
Calcium: 9.6 mg/dL (ref 8.9–10.3)
Chloride: 101 mmol/L (ref 98–111)
Creatinine: 0.69 mg/dL (ref 0.44–1.00)
GFR, Estimated: >60 mL/min (ref >60)
Glucose, Bld: 86 mg/dL (ref 70–99)
Potassium: 4.2 mmol/L (ref 3.5–5.1)
Sodium: 133 mmol/L — ABNORMAL LOW (ref 135–145)
Total Bilirubin: 0.6 mg/dL (ref 0.3–1.2)
Total Protein: 7.3 g/dL (ref 6.5–8.1)

## 2021-11-20 LAB — IRON AND IRON BINDING CAPACITY (CC-WL,HP ONLY)
Iron: 18 ug/dL — ABNORMAL LOW (ref 28–170)
Saturation Ratios: 3 % — ABNORMAL LOW (ref 10.4–31.8)
TIBC: 580 ug/dL — ABNORMAL HIGH (ref 250–450)
UIBC: 562 ug/dL

## 2021-11-20 LAB — CBC WITH DIFFERENTIAL (CANCER CENTER ONLY)
Abs Immature Granulocytes: 0.05 10*3/uL (ref 0.00–0.07)
Basophils Absolute: 0 10*3/uL (ref 0.0–0.1)
Basophils Relative: 0 %
Eosinophils Absolute: 0.1 10*3/uL (ref 0.0–0.5)
Eosinophils Relative: 2 %
HCT: 29.6 % — ABNORMAL LOW (ref 36.0–46.0)
Hemoglobin: 9.2 g/dL — ABNORMAL LOW (ref 12.0–15.0)
Immature Granulocytes: 1 %
Lymphocytes Relative: 23 %
Lymphs Abs: 1.5 10*3/uL (ref 0.7–4.0)
MCH: 24.9 pg — ABNORMAL LOW (ref 26.0–34.0)
MCHC: 31.1 g/dL (ref 30.0–36.0)
MCV: 80 fL (ref 80.0–100.0)
Monocytes Absolute: 0.7 10*3/uL (ref 0.1–1.0)
Monocytes Relative: 10 %
Neutro Abs: 4.4 10*3/uL (ref 1.7–7.7)
Neutrophils Relative %: 64 %
Platelet Count: 198 10*3/uL (ref 150–400)
RBC: 3.7 MIL/uL — ABNORMAL LOW (ref 3.87–5.11)
RDW: 16.9 % — ABNORMAL HIGH (ref 11.5–15.5)
WBC Count: 6.8 10*3/uL (ref 4.0–10.5)
nRBC: 0 % (ref 0.0–0.2)

## 2021-11-20 LAB — FERRITIN: Ferritin: 5 ng/mL — ABNORMAL LOW (ref 11–307)

## 2021-11-20 MED ORDER — IOHEXOL 300 MG/ML  SOLN
75.0000 mL | Freq: Once | INTRAMUSCULAR | Status: AC | PRN
  Administered 2021-11-20: 75 mL via INTRAVENOUS

## 2021-11-21 ENCOUNTER — Other Ambulatory Visit: Payer: Self-pay | Admitting: Adult Health

## 2021-11-21 DIAGNOSIS — G4700 Insomnia, unspecified: Secondary | ICD-10-CM

## 2021-11-21 DIAGNOSIS — F411 Generalized anxiety disorder: Secondary | ICD-10-CM

## 2021-11-23 ENCOUNTER — Telehealth: Payer: Self-pay

## 2021-11-23 MED ORDER — CLORAZEPATE DIPOTASSIUM 3.75 MG PO TABS: 3.7500 mg | ORAL_TABLET | Freq: Two times a day (BID) | ORAL | 0 refills | Status: DC | PRN

## 2021-11-23 NOTE — Telephone Encounter (Signed)
Please call to schedule appt, was due in June.

## 2021-11-23 NOTE — Addendum Note (Signed)
Addended by: Bonney Leitz T on: 11/23/2021 01:56 PM   Modules accepted: Orders

## 2021-11-23 NOTE — Telephone Encounter (Signed)
Pt is scheduled 8/17

## 2021-11-24 ENCOUNTER — Inpatient Hospital Stay: Payer: Medicare HMO | Admitting: Internal Medicine

## 2021-11-24 ENCOUNTER — Other Ambulatory Visit: Payer: Self-pay

## 2021-11-24 VITALS — BP 129/60 | HR 83 | Temp 98.1°F | Resp 15 | Ht 64.5 in | Wt 114.3 lb

## 2021-11-24 DIAGNOSIS — D509 Iron deficiency anemia, unspecified: Secondary | ICD-10-CM

## 2021-11-24 DIAGNOSIS — E041 Nontoxic single thyroid nodule: Secondary | ICD-10-CM | POA: Diagnosis not present

## 2021-11-24 DIAGNOSIS — K5521 Angiodysplasia of colon with hemorrhage: Secondary | ICD-10-CM | POA: Diagnosis not present

## 2021-11-24 DIAGNOSIS — R531 Weakness: Secondary | ICD-10-CM | POA: Diagnosis not present

## 2021-11-24 DIAGNOSIS — C3490 Malignant neoplasm of unspecified part of unspecified bronchus or lung: Secondary | ICD-10-CM

## 2021-11-24 DIAGNOSIS — D500 Iron deficiency anemia secondary to blood loss (chronic): Secondary | ICD-10-CM | POA: Diagnosis not present

## 2021-11-24 DIAGNOSIS — R5383 Other fatigue: Secondary | ICD-10-CM | POA: Diagnosis not present

## 2021-11-24 NOTE — Progress Notes (Signed)
[place] [phone]   [phone]  OFFICE PROGRESS NOTE  Tower, [name], MD [place] [address]  DIAGNOSIS:  1) Anemia secondary to GI occult blood loss.   2) suspicious for stage Ia (T1 a, N0, M0) lung cancer.  The patient declined bronchoscopy for tissue biopsy.  PRIOR THERAPY:  1) Oral iron supplements. Discontinued due to intolerance (severe abdominal pain) 2) status post SBRT to the lung lesion under the care of Dr. Donella Stade at [facility].  CURRENT THERAPY: IV iron infusions with venofer PRN. Last infusion on in December 2022.  INTERVAL HISTORY: Christine Barton 71 y.o. female returns to the clinic today for follow-up visit.  The patient is complaining of increasing fatigue and weakness but no significant bleeding, bruises or ecchymosis.  She has no chest pain but has shortness of breath with exertion with no cough or hemoptysis.  She has no nausea, vomiting, diarrhea or constipation.  She has no headache or visual changes.  She is here today for evaluation with repeat CBC, iron study and ferritin as well as CT scan of the chest for restaging of her lung cancer.  MEDICAL HISTORY: Past Medical History:  Diagnosis Date   Alcohol abuse, unspecified    Breast cancer ([place]) 1998   Right   Cataract    left eye   Cervical spondylosis 2006   MRI   Degeneration of cervical intervertebral disc 2006   MRI   Diabetes mellitus without complication ([place])    Diverticulosis of colon (without mention of hemorrhage)    Hyperpotassemia    Lung cancer ([place]) 03/2021   started radiation in January 2023, right side   Microscopic hematuria    Mononeuritis of unspecified site    Nonspecific abnormal results of liver function study    Other abnormal glucose    Other and unspecified hyperlipidemia    no per pt   Other chronic nonalcoholic liver disease    Personal history of chemotherapy    Personal history of  malignant neoplasm of breast    Personal history of radiation therapy    Pneumothorax, acute    right, spontaneous   Tobacco use disorder    Unspecified vitamin D deficiency     ALLERGIES:  is allergic to oxycodone, crestor [rosuvastatin], glipizide, and prednisone.  MEDICATIONS:  Current Outpatient Medications  Medication Sig Dispense Refill   albuterol (VENTOLIN HFA) 108 (90 Base) MCG/ACT inhaler INHALE 2 PUFFS INTO THE LUNGS EVERY 4 HOURS AS NEEDED FOR WHEEZE OR FOR SHORTNESS OF BREATH 18 each 3   budesonide-formoterol (SYMBICORT) 160-4.5 MCG/ACT inhaler Inhale 2 puffs into the lungs in the morning and at bedtime. 1 each 6   cholecalciferol (VITAMIN D3) 25 MCG (1000 UNIT) tablet Take 1,000 Units by mouth 2 (two) times daily.     clorazepate (TRANXENE) 3.75 MG tablet Take 1 tablet (3.75 mg total) by mouth 2 (two) times daily as needed for anxiety. 6 tablet 0   cyclobenzaprine (FLEXERIL) 5 MG tablet Take 1 tablet (5 mg total) by mouth at bedtime. 1 tablet 0   furosemide (LASIX) 20 MG tablet TAKE 1 TABLET BY MOUTH EVERY DAY 90 tablet 1   lactulose (CHRONULAC) 10 GM/15ML solution Take 15 mLs (10 g total) by mouth 3 (three) times daily. 236 mL 11   metFORMIN (GLUCOPHAGE-XR) 500 MG 24 hr tablet TAKE 1 TABLET BY MOUTH EVERY DAY WITH BREAKFAST 90 tablet 1   Multiple  Vitamin (MULTIVITAMIN) tablet Take 1 tablet by mouth daily.     Multiple Vitamins-Minerals (PRESERVISION AREDS 2) CAPS Take 1 capsule by mouth 2 (two) times daily.     mupirocin ointment (BACTROBAN) 2 % APPLY 1 APPLICATION TOPICALLY 2 (TWO) TIMES DAILY. TO AFFECTED AREAS 22 g 2   nadolol (CORGARD) 20 MG tablet TAKE 1 TABLET BY MOUTH EVERY DAY 90 tablet 3   nystatin (MYCOSTATIN) 100000 UNIT/ML suspension TAKE 5 MLS (500,000 UNITS TOTAL) BY MOUTH 3 (THREE) TIMES DAILY. SWISH AND SWALLOW 120 mL 0   pantoprazole (PROTONIX) 40 MG tablet TAKE 1 TABLET (40 MG TOTAL) BY MOUTH 2 (TWO) TIMES DAILY BEFORE A MEAL. (Patient taking differently:  Take 40 mg by mouth daily.) 180 tablet 1   rifaximin (XIFAXAN) 550 MG TABS tablet Take 1 tablet (550 mg total) by mouth 2 (two) times daily. 60 tablet 3   SYSTANE COMPLETE 0.6 % SOLN Apply 1 drop to eye 2 (two) times daily.     traMADol (ULTRAM) 50 MG tablet Take 1 tablet (50 mg total) by mouth every 8 (eight) hours as needed for severe pain. 15 tablet 0   ursodiol (ACTIGALL) 300 MG capsule TAKE 1 CAPSULE BY MOUTH EVERY DAY 30 capsule 1   No current facility-administered medications for this visit.    SURGICAL HISTORY:  Past Surgical History:  Procedure Laterality Date   BIOPSY  07/27/2019   Procedure: BIOPSY;  Surgeon: Yetta Flock, MD;  Location: [facility];  Service: Gastroenterology;;   BREAST BIOPSY  9/03   Right   BREAST LUMPECTOMY Right 1998   CHEST TUBE INSERTION  11/02/2014   COLONOSCOPY     COLONOSCOPY WITH PROPOFOL N/A 07/27/2019   Procedure: COLONOSCOPY WITH PROPOFOL;  Surgeon: Yetta Flock, MD;  Location: [facility];  Service: Gastroenterology;  Laterality: N/A;   ESOPHAGOGASTRODUODENOSCOPY (EGD) WITH PROPOFOL N/A 10/30/2018   Procedure: ESOPHAGOGASTRODUODENOSCOPY (EGD) WITH PROPOFOL;  Surgeon: Mauri Pole, MD;  Location: [facility];  Service: Endoscopy;  Laterality: N/A;   ESOPHAGOGASTRODUODENOSCOPY (EGD) WITH PROPOFOL N/A 03/12/2019   Procedure: ESOPHAGOGASTRODUODENOSCOPY (EGD) WITH PROPOFOL;  Surgeon: Mauri Pole, MD;  Location: [facility];  Service: Endoscopy;  Laterality: N/A;   ESOPHAGOGASTRODUODENOSCOPY (EGD) WITH PROPOFOL N/A 07/27/2019   Procedure: ESOPHAGOGASTRODUODENOSCOPY (EGD) WITH PROPOFOL;  Surgeon: Yetta Flock, MD;  Location: [facility];  Service: Gastroenterology;  Laterality: N/A;   EYE SURGERY  02/2017   cataract extraction with lens implant-left   HOT HEMOSTASIS N/A 07/27/2019   Procedure: HOT HEMOSTASIS (ARGON PLASMA COAGULATION/BICAP);  Surgeon: Yetta Flock, MD;  Location: [facility];  Service:  Gastroenterology;  Laterality: N/A;   MOUTH SURGERY     POLYPECTOMY  07/27/2019   Procedure: POLYPECTOMY;  Surgeon: Yetta Flock, MD;  Location: [facility];  Service: Gastroenterology;;   TUBAL LIGATION      REVIEW OF SYSTEMS:  Constitutional: positive for fatigue Eyes: negative Ears, nose, mouth, throat, and face: negative Respiratory: positive for dyspnea on exertion Cardiovascular: negative Gastrointestinal: negative Genitourinary:negative Integument/breast: negative Hematologic/lymphatic: negative Musculoskeletal:negative Neurological: negative Behavioral/Psych: negative Endocrine: negative Allergic/Immunologic: negative   PHYSICAL EXAMINATION: General appearance: alert, cooperative, fatigued, and no distress Head: Normocephalic, without obvious abnormality, atraumatic Neck: no adenopathy, no JVD, supple, symmetrical, trachea midline, and thyroid not enlarged, symmetric, no tenderness/mass/nodules Lymph nodes: Cervical, supraclavicular, and axillary nodes normal. Resp: clear to auscultation bilaterally Back: symmetric, no curvature. ROM normal. No CVA tenderness. Cardio: regular rate and rhythm, S1, S2 normal, no murmur, click, rub or gallop GI: soft,  non-tender; bowel sounds normal; no masses,  no organomegaly Extremities: extremities normal, atraumatic, no cyanosis or edema Neurologic: Alert and oriented X 3, normal strength and tone. Normal symmetric reflexes. Normal coordination and gait  ECOG PERFORMANCE STATUS: 1 - Symptomatic but completely ambulatory  Blood pressure 129/60, pulse 83, temperature 98.1 F (36.7 C), temperature source Oral, resp. rate 15, height 5' 4.5" (1.638 m), weight 114 lb 4.8 oz (51.8 kg), SpO2 100 %.  LABORATORY DATA: Lab Results  Component Value Date   WBC 6.8 11/20/2021   HGB 9.2 (L) 11/20/2021   HCT 29.6 (L) 11/20/2021   MCV 80.0 11/20/2021   PLT 198 11/20/2021      Chemistry      Component Value Date/Time   NA 133 (L)  11/20/2021 1512   NA 132 (L) 05/26/2021 1147   K 4.2 11/20/2021 1512   CL 101 11/20/2021 1512   CO2 26 11/20/2021 1512   BUN 13 11/20/2021 1512   BUN 21 05/26/2021 1147   CREATININE 0.69 11/20/2021 1512      Component Value Date/Time   CALCIUM 9.6 11/20/2021 1512   ALKPHOS 74 11/20/2021 1512   AST 17 11/20/2021 1512   ALT 10 11/20/2021 1512   BILITOT 0.6 11/20/2021 1512       RADIOGRAPHIC STUDIES: CT Chest W Contrast  Result Date: 11/23/2021 CLINICAL DATA:  Non-small cell lung cancer.  * Tracking Code: BO * EXAM: CT CHEST WITH CONTRAST TECHNIQUE: Multidetector CT imaging of the chest was performed during intravenous contrast administration. RADIATION DOSE REDUCTION: This exam was performed according to the departmental dose-optimization program which includes automated exposure control, adjustment of the mA and/or kV according to patient size and/or use of iterative reconstruction technique. CONTRAST:  47m OMNIPAQUE IOHEXOL 300 MG/ML  SOLN COMPARISON:  08/03/2021. FINDINGS: Cardiovascular: Atherosclerotic calcification of the aorta, aortic valve and coronary arteries. Heart size normal. No pericardial effusion. Mediastinum/Nodes: Possible heterogeneous 2.0 cm nodule off the lower pole left thyroid. Mediastinal lymph nodes are not enlarged by CT size criteria. No hilar or axillary adenopathy. Surgical clips in the right axilla. Esophagus is grossly unremarkable. Lungs/Pleura: Post radiation scarring in the apical segment right upper lobe. Mild paraseptal emphysema. Subpleural radiation scarring in the anterior right lung. Subpleural scarring in the superior segment right lower lobe. 2 mm posterior left upper lobe nodule (5/59), unchanged. No pleural fluid. Airway is unremarkable. Upper Abdomen: Focal subcentimeter blush of hyperattenuation in the left hepatic lobe (2/136), indeterminate. Liver margin is irregular. Stones in the gallbladder. Visualized portions of the adrenal glands and right  kidney are unremarkable. Subcentimeter low-attenuation lesion in the left kidney, too small to characterize. No follow-up necessary. Visualized portions of the spleen, pancreas and stomach are grossly unremarkable. Musculoskeletal: Degenerative changes in the spine. No worrisome lytic or sclerotic lesions. IMPRESSION: 1. Post radiation scarring in the right lung. No evidence of recurrent or metastatic disease. 2. Subpleural scarring in the superior segment right lower lobe. 3. Cirrhosis. Focal blush of hyperattenuation in the left hepatic lobe is indeterminate. Consider MR abdomen in further evaluation, as hepatocellular carcinoma cannot be excluded. 4. Cholelithiasis. 5. Difficult to exclude a heterogeneous nodule off the lower pole left thyroid. Recommend thyroid ultrasound. (Ref: J Am Coll Radiol. 2015 Feb;12(2): 143-50). 6. Aortic atherosclerosis (ICD10-I70.0). Coronary artery calcification. 7.  Emphysema (ICD10-J43.9). Electronically Signed   By: MLorin PicketM.D.   On: 11/23/2021 08:16   MM 3D SCREEN BREAST BILATERAL  Result Date: 11/18/2021 CLINICAL DATA:  Screening. EXAM: DIGITAL SCREENING  BILATERAL MAMMOGRAM WITH TOMOSYNTHESIS AND CAD TECHNIQUE: Bilateral screening digital craniocaudal and mediolateral oblique mammograms were obtained. Bilateral screening digital breast tomosynthesis was performed. The images were evaluated with computer-aided detection. COMPARISON:  Previous exam(s). ACR Breast Density Category b: There are scattered areas of fibroglandular density. FINDINGS: There are no findings suspicious for malignancy. IMPRESSION: No mammographic evidence of malignancy. A result letter of this screening mammogram will be mailed directly to the patient. RECOMMENDATION: Screening mammogram in one year. (Code:[id number]-B-01Y) BI-RADS CATEGORY  1: Negative. Electronically Signed   By: Lillia Mountain M.D.   On: 11/18/2021 10:43    ASSESSMENT AND PLAN: This is a very pleasant 71 years old white female with  iron deficiency anemia secondary to likely chronic blood loss secondary to AV malformation as well as lack of dietary iron supplements.  She has intolerance to the oral iron tablets. The patient has been treated with IV iron infusion with Venofer 300 mg IV weekly for 3 weeks and tolerated it fairly well. The patient is complaining of increasing fatigue and weakness in her CBC, iron study and ferritin showed significant iron deficiency anemia. I recommended for her to proceed with iron infusion and we will arrange for the patient to have Monoferric infusion for 1 dose because of the logistic issues coming from [place]. For the suspicious stage I lung cancer, CT scan of the chest showed no concerning findings for disease recurrence or metastasis. I recommended for her to continue on observation with repeat CT scan of the chest in 6 months. I will see the patient back for follow-up visit in 3 months for evaluation and repeat blood work. The patient was advised to call immediately if she has any other concerning symptoms in the interval.  The patient voices understanding of current disease status and treatment options and is in agreement with the current care plan. All questions were answered. The patient knows to call the clinic with any problems, questions or concerns. We can certainly see the patient much sooner if necessary.   Disclaimer: This note was dictated with voice recognition software. Similar sounding words can inadvertently be transcribed and may not be corrected upon review.
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
## 2021-11-25 NOTE — Telephone Encounter (Signed)
RF was sent on 8/14.

## 2021-11-26 ENCOUNTER — Ambulatory Visit: Payer: Medicare HMO | Admitting: Adult Health

## 2021-11-26 NOTE — Telephone Encounter (Signed)
Called patient and told her she needed to schedule an appt before we could fill more. She said she would call tomorrow.

## 2021-11-29 ENCOUNTER — Telehealth: Payer: Self-pay | Admitting: Family Medicine

## 2021-11-29 DIAGNOSIS — E559 Vitamin D deficiency, unspecified: Secondary | ICD-10-CM

## 2021-11-29 DIAGNOSIS — R000 Tachycardia, unspecified: Secondary | ICD-10-CM

## 2021-11-29 DIAGNOSIS — I2510 Atherosclerotic heart disease of native coronary artery without angina pectoris: Secondary | ICD-10-CM

## 2021-11-29 DIAGNOSIS — D500 Iron deficiency anemia secondary to blood loss (chronic): Secondary | ICD-10-CM

## 2021-11-29 DIAGNOSIS — K7031 Alcoholic cirrhosis of liver with ascites: Secondary | ICD-10-CM

## 2021-11-29 DIAGNOSIS — E118 Type 2 diabetes mellitus with unspecified complications: Secondary | ICD-10-CM

## 2021-11-29 DIAGNOSIS — E1169 Type 2 diabetes mellitus with other specified complication: Secondary | ICD-10-CM

## 2021-11-29 NOTE — Telephone Encounter (Signed)
-----   Message from Ellamae Sia sent at 11/19/2021  3:49 PM EDT ----- Regarding: Lab orders for Wednesday, 8.23.23 Patient is scheduled for CPX labs, please order future labs, Thanks , Karna Christmas

## 2021-12-02 ENCOUNTER — Ambulatory Visit (HOSPITAL_COMMUNITY)
Admission: RE | Admit: 2021-12-02 | Discharge: 2021-12-02 | Disposition: A | Discharge status: Home / Self Care | Payer: Medicare HMO | Source: Ambulatory Visit | Attending: Internal Medicine | Admitting: Internal Medicine

## 2021-12-02 ENCOUNTER — Other Ambulatory Visit: Payer: Medicare HMO

## 2021-12-02 DIAGNOSIS — E041 Nontoxic single thyroid nodule: Secondary | ICD-10-CM | POA: Diagnosis not present

## 2021-12-08 ENCOUNTER — Encounter: Payer: Self-pay | Admitting: Adult Health

## 2021-12-08 ENCOUNTER — Other Ambulatory Visit: Payer: Medicare HMO

## 2021-12-08 ENCOUNTER — Telehealth (INDEPENDENT_AMBULATORY_CARE_PROVIDER_SITE_OTHER): Payer: Medicare HMO | Admitting: Adult Health

## 2021-12-08 DIAGNOSIS — F411 Generalized anxiety disorder: Secondary | ICD-10-CM | POA: Diagnosis not present

## 2021-12-08 DIAGNOSIS — G4701 Insomnia due to medical condition: Secondary | ICD-10-CM

## 2021-12-08 DIAGNOSIS — G4700 Insomnia, unspecified: Secondary | ICD-10-CM | POA: Diagnosis not present

## 2021-12-08 MED ORDER — CLORAZEPATE DIPOTASSIUM 3.75 MG PO TABS: 3.7500 mg | ORAL_TABLET | Freq: Two times a day (BID) | ORAL | 2 refills | Status: DC | PRN

## 2021-12-08 NOTE — Progress Notes (Signed)
Christine Barton [id number] [birth year] 71 y.o.  Virtual Visit via Video Note  I connected with pt @ on 12/08/21 at 12:00 PM EDT by a video enabled telemedicine application and verified that I am speaking with the correct person using two identifiers.   I discussed the limitations of evaluation and management by telemedicine and the availability of in person appointments. The patient expressed understanding and agreed to proceed.  I discussed the assessment and treatment plan with the patient. The patient was provided an opportunity to ask questions and all were answered. The patient agreed with the plan and demonstrated an understanding of the instructions.   The patient was advised to call back or seek an in-person evaluation if the symptoms worsen or if the condition fails to improve as anticipated.  I provided 15 minutes of non-face-to-face time during this encounter.  The patient was located at home.  The provider was located at [facility].   Aloha Gell, NP   Subjective:   Patient ID:  Christine Barton is a 71 y.o. (DOB Jun 18, 1950) female.  Chief Complaint: No chief complaint on file.   HPI Christine Barton presents for follow-up of GAD and insomnia.   Describes mood today as "ok". Pleasant. Mood symptoms - denies depression and irritability. Feels anxious at times. Denies panic attacks. Stating "I'm doing ok". Feels like Tranxene continues to work well for her. Reporting some situational stressors - repairs to home. 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 Diagnosed with lung cancer. Stable interest and motivation. Taking medications as prescribed.  Energy levels stable. Active, does not have a regular exercise routine.  Enjoys some usual interests and activities. Lives alone with cats. Lives next to daughter. Spending time with family.  Appetite adequate. Weight stable. Sleeps well most nights. Averages 7 to 8 hours. Focus and concentration pretty good.  Completing tasks. Managing aspects of household. Retired from [employer]. Denies SI or HI.  Denies AH or VH.  Review of Systems:  Review of Systems  Musculoskeletal:  Negative for gait problem.  Neurological:  Negative for tremors.  Psychiatric/Behavioral:         Please refer to HPI    Medications: I have reviewed the patient's current medications.  Current Outpatient Medications  Medication Sig Dispense Refill   albuterol (VENTOLIN HFA) 108 (90 Base) MCG/ACT inhaler INHALE 2 PUFFS INTO THE LUNGS EVERY 4 HOURS AS NEEDED FOR WHEEZE OR FOR SHORTNESS OF BREATH 18 each 3   budesonide-formoterol (SYMBICORT) 160-4.5 MCG/ACT inhaler Inhale 2 puffs into the lungs in the morning and at bedtime. 1 each 6   cholecalciferol (VITAMIN D3) 25 MCG (1000 UNIT) tablet Take 1,000 Units by mouth 2 (two) times daily.     clorazepate (TRANXENE) 3.75 MG tablet Take 1 tablet (3.75 mg total) by mouth 2 (two) times daily as needed for anxiety. 60 tablet 2   cyclobenzaprine (FLEXERIL) 5 MG tablet Take 1 tablet (5 mg total) by mouth at bedtime. 1 tablet 0   furosemide (LASIX) 20 MG tablet TAKE 1 TABLET BY MOUTH EVERY DAY 90 tablet 1   lactulose (CHRONULAC) 10 GM/15ML solution Take 15 mLs (10 g total) by mouth 3 (three) times daily. 236 mL 11   metFORMIN (GLUCOPHAGE-XR) 500 MG 24 hr tablet TAKE 1 TABLET BY MOUTH EVERY DAY WITH BREAKFAST 90 tablet 1   Multiple Vitamin (MULTIVITAMIN) tablet Take 1 tablet by mouth daily.     Multiple Vitamins-Minerals (PRESERVISION AREDS 2) CAPS Take 1 capsule by  mouth 2 (two) times daily.     mupirocin ointment (BACTROBAN) 2 % APPLY 1 APPLICATION TOPICALLY 2 (TWO) TIMES DAILY. TO AFFECTED AREAS 22 g 2   nadolol (CORGARD) 20 MG tablet TAKE 1 TABLET BY MOUTH EVERY DAY 90 tablet 3   nystatin (MYCOSTATIN) 100000 UNIT/ML suspension TAKE 5 MLS (500,000 UNITS TOTAL) BY MOUTH 3 (THREE) TIMES DAILY. SWISH AND SWALLOW 120 mL 0   pantoprazole (PROTONIX) 40 MG tablet TAKE 1 TABLET (40  MG TOTAL) BY MOUTH 2 (TWO) TIMES DAILY BEFORE A MEAL. (Patient taking differently: Take 40 mg by mouth daily.) 180 tablet 1   rifaximin (XIFAXAN) 550 MG TABS tablet Take 1 tablet (550 mg total) by mouth 2 (two) times daily. 60 tablet 3   SYSTANE COMPLETE 0.6 % SOLN Apply 1 drop to eye 2 (two) times daily.     traMADol (ULTRAM) 50 MG tablet Take 1 tablet (50 mg total) by mouth every 8 (eight) hours as needed for severe pain. 15 tablet 0   ursodiol (ACTIGALL) 300 MG capsule TAKE 1 CAPSULE BY MOUTH EVERY DAY 30 capsule 1   No current facility-administered medications for this visit.    Medication Side Effects: None  Allergies:  Allergies  Allergen Reactions   Oxycodone Nausea And Vomiting   Crestor [Rosuvastatin]     Abdominal pain     Glipizide Other (See Comments)    Stomach pain   Prednisone     GI upset and general malaise     Past Medical History:  Diagnosis Date   Alcohol abuse, unspecified    Breast cancer ([place]) 1998   Right   Cataract    left eye   Cervical spondylosis 2006   MRI   Degeneration of cervical intervertebral disc 2006   MRI   Diabetes mellitus without complication ([place])    Diverticulosis of colon (without mention of hemorrhage)    Hyperpotassemia    Lung cancer ([place]) 03/2021   started radiation in January 2023, right side   Microscopic hematuria    Mononeuritis of unspecified site    Nonspecific abnormal results of liver function study    Other abnormal glucose    Other and unspecified hyperlipidemia    no per pt   Other chronic nonalcoholic liver disease    Personal history of chemotherapy    Personal history of malignant neoplasm of breast    Personal history of radiation therapy    Pneumothorax, acute    right, spontaneous   Tobacco use disorder    Unspecified vitamin D deficiency     Family History  Problem Relation Age of Onset   Heart failure Father    Heart attack Father    Colon cancer Maternal Uncle    Stroke Mother     Esophageal cancer Neg Hx    Rectal cancer Neg Hx    Stomach cancer Neg Hx    Pancreatic cancer Neg Hx     Social History   Socioeconomic History   Marital status: Single    Spouse name: Not on file   Number of children: 1   Years of education: Not on file   Highest education level: Not on file  Occupational History   Occupation: retired    Fish farm manager: [employer]  Tobacco Use   Smoking status: Former    Packs/day: 1.00    Years: 58.00    Total pack years: 58.00    Types: Cigarettes    Start date: 09/25/2021    Passive exposure:  Past   Smokeless tobacco: Never   Tobacco comments:    0.5 ppd - 04/14/21    09/23/2021- Patient smokes 3 cigarettes daily   Vaping Use   Vaping Use: Never used  Substance and Sexual Activity   Alcohol use: Not Currently   Drug use: No   Sexual activity: Not Currently  Other Topics Concern   Not on file  Social History Narrative   Divorced      1 child      Works at [employer] Strain: [place]  (11/07/2020)   Overall Financial Resource Strain (CARDIA)    Difficulty of Paying Living Expenses: Not hard at all  Food Insecurity: No Food Insecurity (11/07/2020)   Hunger Vital Sign    Worried About Running Out of Food in the Last Year: Never true    [place] in the Last Year: Never true  Transportation Needs: No Transportation Needs (11/07/2020)   PRAPARE - Hydrologist (Medical): No    Lack of Transportation (Non-Medical): No  Physical Activity: Inactive (11/07/2020)   Exercise Vital Sign    Days of Exercise per Week: 0 days    Minutes of Exercise per Session: 0 min  Stress: No Stress Concern Present (11/07/2020)   [place]    Feeling of Stress : Not at all  Social Connections: Not on file  Intimate Partner Violence: Not At Risk (11/07/2020)   Humiliation, Afraid, Rape,  and Kick questionnaire    Fear of Current or Ex-Partner: No    Emotionally Abused: No    Physically Abused: No    Sexually Abused: No    Past Medical History, Surgical history, Social history, and Family history were reviewed and updated as appropriate.   Please see review of systems for further details on the patient's review from today.   Objective:   Physical Exam:  LMP  (LMP Unknown)   Physical Exam Constitutional:      General: She is not in acute distress. Musculoskeletal:        General: No deformity.  Neurological:     Mental Status: She is alert and oriented to person, place, and time.     Coordination: Coordination normal.  Psychiatric:        Attention and Perception: Attention and perception normal. She does not perceive auditory or visual hallucinations.        Mood and Affect: Mood normal. Mood is not anxious or depressed. Affect is not labile, blunt, angry or inappropriate.        Speech: Speech normal.        Behavior: Behavior normal.        Thought Content: Thought content normal. Thought content is not paranoid or delusional. Thought content does not include homicidal or suicidal ideation. Thought content does not include homicidal or suicidal plan.        Cognition and Memory: Cognition and memory normal.        Judgment: Judgment normal.     Comments: Insight intact     Lab Review:     Component Value Date/Time   NA 133 (L) 11/20/2021 1512   NA 132 (L) 05/26/2021 1147   K 4.2 11/20/2021 1512   CL 101 11/20/2021 1512   CO2 26 11/20/2021 1512   GLUCOSE 86 11/20/2021 1512   BUN 13 11/20/2021  1512   BUN 21 05/26/2021 1147   CREATININE 0.69 11/20/2021 1512   CALCIUM 9.6 11/20/2021 1512   PROT 7.3 11/20/2021 1512   PROT 7.1 05/26/2021 1147   ALBUMIN 4.2 11/20/2021 1512   ALBUMIN 4.4 05/26/2021 1147   AST 17 11/20/2021 1512   ALT 10 11/20/2021 1512   ALKPHOS 74 11/20/2021 1512   BILITOT 0.6 11/20/2021 1512   GFRNONAA >60 11/20/2021 1512   GFRAA  83 12/11/2019 1617       Component Value Date/Time   WBC 6.8 11/20/2021 1512   WBC 11.5 (H) 09/03/2021 1146   RBC 3.70 (L) 11/20/2021 1512   HGB 9.2 (L) 11/20/2021 1512   HGB 12.4 12/11/2019 1617   HCT 29.6 (L) 11/20/2021 1512   HCT 39.3 12/11/2019 1617   PLT 198 11/20/2021 1512   PLT 120 (L) 12/11/2019 1617   MCV 80.0 11/20/2021 1512   MCV 86 12/11/2019 1617   MCH 24.9 (L) 11/20/2021 1512   MCHC 31.1 11/20/2021 1512   RDW 16.9 (H) 11/20/2021 1512   RDW 17.0 (H) 12/11/2019 1617   LYMPHSABS 1.5 11/20/2021 1512   MONOABS 0.7 11/20/2021 1512   EOSABS 0.1 11/20/2021 1512   BASOSABS 0.0 11/20/2021 1512    No results found for: "POCLITH", "LITHIUM"   No results found for: "PHENYTOIN", "PHENOBARB", "VALPROATE", "CBMZ"   .res Assessment: Plan:    Plan:  PDMP reviewed  1. Tranxene 3.75 BID prn anxiety  RTC 6 months  Patient advised to contact office with any questions, adverse effects, or acute worsening in signs and symptoms.  Discussed potential benefits, risk, and side effects of benzodiazepines to include potential risk of tolerance and dependence, as well as possible drowsiness. Advised patient not to drive if experiencing drowsiness and to take lowest possible effective dose to minimize risk of dependence and tolerance.   Diagnoses and all orders for this visit:  Insomnia, unspecified type  Generalized anxiety disorder -     clorazepate (TRANXENE) 3.75 MG tablet; Take 1 tablet (3.75 mg total) by mouth 2 (two) times daily as needed for anxiety.  Insomnia, unspecified -     clorazepate (TRANXENE) 3.75 MG tablet; Take 1 tablet (3.75 mg total) by mouth 2 (two) times daily as needed for anxiety.     Please see After Visit Summary for patient specific instructions.  Future Appointments  Date Time Provider [place]  12/10/2021  2:00 PM Ezekiel Slocumb, NP ACP-ACP None  12/21/2021 10:30 AM [facility] LAB [facility]  12/23/2021 11:30 AM Tower, [name], MD  [facility] PEC  01/07/2022 12:00 PM WL-MR 1 WL-MRI [place]  01/15/2022  2:10 PM Nandigam, Venia Minks, MD [facility]  01/28/2022  1:30 PM Tyler Pita, MD [facility] None  02/24/2022  9:00 AM [facility] LAB [facility] None  02/24/2022  9:45 AM Curt Bears, MD [facility] None  03/23/2022  2:00 PM Donato Heinz, MD [facility] None  07/15/2022  3:00 PM [facility] PHARMACIST [facility] PEC    No orders of the defined types were placed in this encounter.     -------------------------------

## 2021-12-09 ENCOUNTER — Other Ambulatory Visit: Payer: Self-pay | Admitting: Internal Medicine

## 2021-12-09 ENCOUNTER — Telehealth: Payer: Self-pay

## 2021-12-09 NOTE — Telephone Encounter (Signed)
Pt called advising she has not been scheduled for her iron infusions.  I reviewed pts last progress note which initiates Monoferric was ordered for her. I was able to determine this was denied by her insurance. Per the PA department Venofer is preferred.  A message has been sent to Dr. Julien Nordmann to update the tx to reflect Venofer.  I have called the pt back and LVM advising of this and I have provided her the contact number to the [facility] to schedule her appt. Pt was advised to allow 48hrs for this change before calling.

## 2021-12-10 ENCOUNTER — Other Ambulatory Visit: Payer: Medicare HMO | Admitting: Student

## 2021-12-10 DIAGNOSIS — Z515 Encounter for palliative care: Secondary | ICD-10-CM | POA: Diagnosis not present

## 2021-12-10 DIAGNOSIS — Z7189 Other specified counseling: Secondary | ICD-10-CM
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
## 2021-12-11 ENCOUNTER — Encounter: Payer: Medicare HMO | Admitting: Family Medicine

## 2021-12-16 ENCOUNTER — Other Ambulatory Visit: Payer: Self-pay | Admitting: Medical Oncology

## 2021-12-16 ENCOUNTER — Telehealth: Payer: Self-pay | Admitting: Internal Medicine

## 2021-12-16 NOTE — Telephone Encounter (Signed)
Patient called and stated she needs to be set up for Iron appointments. She is a patient of Dr. Julien Nordmann. She stated she spoke with you yesterday and you were checking into it. Please advise.

## 2021-12-18 ENCOUNTER — Other Ambulatory Visit: Payer: Self-pay | Admitting: Family Medicine

## 2021-12-21 ENCOUNTER — Other Ambulatory Visit (INDEPENDENT_AMBULATORY_CARE_PROVIDER_SITE_OTHER): Payer: Medicare HMO

## 2021-12-21 ENCOUNTER — Inpatient Hospital Stay: Payer: Medicare HMO | Attending: Physician Assistant

## 2021-12-21 VITALS — BP 115/52 | HR 72 | Temp 97.5°F | Resp 17

## 2021-12-21 DIAGNOSIS — E1169 Type 2 diabetes mellitus with other specified complication: Secondary | ICD-10-CM

## 2021-12-21 DIAGNOSIS — K922 Gastrointestinal hemorrhage, unspecified: Secondary | ICD-10-CM | POA: Diagnosis not present

## 2021-12-21 DIAGNOSIS — D500 Iron deficiency anemia secondary to blood loss (chronic): Secondary | ICD-10-CM | POA: Insufficient documentation

## 2021-12-21 DIAGNOSIS — R000 Tachycardia, unspecified: Secondary | ICD-10-CM

## 2021-12-21 DIAGNOSIS — E559 Vitamin D deficiency, unspecified: Secondary | ICD-10-CM

## 2021-12-21 DIAGNOSIS — E118 Type 2 diabetes mellitus with unspecified complications: Secondary | ICD-10-CM | POA: Diagnosis not present

## 2021-12-21 DIAGNOSIS — K5520 Angiodysplasia of colon without hemorrhage: Secondary | ICD-10-CM

## 2021-12-21 DIAGNOSIS — E785 Hyperlipidemia, unspecified: Secondary | ICD-10-CM | POA: Diagnosis not present

## 2021-12-21 LAB — LIPID PANEL
Cholesterol: 160 mg/dL (ref 0–200)
HDL: 43.3 mg/dL (ref >39.00)
LDL Cholesterol: 94 mg/dL (ref 0–99)
NonHDL: 116.28
Total CHOL/HDL Ratio: 4
Triglycerides: 110 mg/dL (ref 0.0–149.0)
VLDL: 22 mg/dL (ref 0.0–40.0)

## 2021-12-21 LAB — COMPREHENSIVE METABOLIC PANEL
ALT: 5 U/L (ref 0–35)
AST: 14 U/L (ref 0–37)
Albumin: 3.8 g/dL (ref 3.5–5.2)
Alkaline Phosphatase: 78 U/L (ref 39–117)
BUN: 12 mg/dL (ref 6–23)
CO2: 29 mEq/L (ref 19–32)
Calcium: 9.4 mg/dL (ref 8.4–10.5)
Chloride: 99 mEq/L (ref 96–112)
Creatinine, Ser: 0.78 mg/dL (ref 0.40–1.20)
GFR: 76.63 mL/min (ref >60.00)
Glucose, Bld: 109 mg/dL — ABNORMAL HIGH (ref 70–99)
Potassium: 4.5 mEq/L (ref 3.5–5.1)
Sodium: 134 mEq/L — ABNORMAL LOW (ref 135–145)
Total Bilirubin: 0.4 mg/dL (ref 0.2–1.2)
Total Protein: 6.4 g/dL (ref 6.0–8.3)

## 2021-12-21 LAB — CBC WITH DIFFERENTIAL/PLATELET
Basophils Absolute: 0 10*3/uL (ref 0.0–0.1)
Basophils Relative: 0.8 % (ref 0.0–3.0)
Eosinophils Absolute: 0.1 10*3/uL (ref 0.0–0.7)
Eosinophils Relative: 1.9 % (ref 0.0–5.0)
HCT: 28.9 % — ABNORMAL LOW (ref 36.0–46.0)
Hemoglobin: 9.1 g/dL — ABNORMAL LOW (ref 12.0–15.0)
Lymphocytes Relative: 18.1 % (ref 12.0–46.0)
Lymphs Abs: 1.1 10*3/uL (ref 0.7–4.0)
MCHC: 31.4 g/dL (ref 30.0–36.0)
MCV: 73 fl — ABNORMAL LOW (ref 78.0–100.0)
Monocytes Absolute: 0.6 10*3/uL (ref 0.1–1.0)
Monocytes Relative: 9.5 % (ref 3.0–12.0)
Neutro Abs: 4.3 10*3/uL (ref 1.4–7.7)
Neutrophils Relative %: 69.7 % (ref 43.0–77.0)
Platelets: 210 10*3/uL (ref 150.0–400.0)
RBC: 3.96 Mil/uL (ref 3.87–5.11)
RDW: 18.9 % — ABNORMAL HIGH (ref 11.5–15.5)
WBC: 6.2 10*3/uL (ref 4.0–10.5)

## 2021-12-21 LAB — TSH: TSH: 3.46 u[IU]/mL (ref 0.35–5.50)

## 2021-12-21 LAB — VITAMIN D 25 HYDROXY (VIT D DEFICIENCY, FRACTURES): VITD: 96.57 ng/mL (ref 30.00–100.00)

## 2021-12-21 LAB — HEMOGLOBIN A1C: Hgb A1c MFr Bld: 6.6 % — ABNORMAL HIGH (ref 4.6–6.5)

## 2021-12-21 MED ORDER — SODIUM CHLORIDE 0.9 % IV SOLN
300.0000 mg | INTRAVENOUS | Status: DC
  Administered 2021-12-21: 300 mg via INTRAVENOUS
  Filled 2021-12-21: qty 300

## 2021-12-21 MED ORDER — SODIUM CHLORIDE 0.9 % IV SOLN
Freq: Once | INTRAVENOUS | Status: AC
  Filled 2021-12-21: qty 250

## 2021-12-23 ENCOUNTER — Ambulatory Visit (INDEPENDENT_AMBULATORY_CARE_PROVIDER_SITE_OTHER): Payer: Medicare HMO | Admitting: Family Medicine

## 2021-12-23 ENCOUNTER — Encounter: Payer: Self-pay | Admitting: Family Medicine

## 2021-12-23 VITALS — BP 118/56 | HR 76 | Temp 97.7°F | Ht 64.0 in | Wt 115.2 lb

## 2021-12-23 DIAGNOSIS — E1169 Type 2 diabetes mellitus with other specified complication: Secondary | ICD-10-CM | POA: Diagnosis not present

## 2021-12-23 DIAGNOSIS — F17200 Nicotine dependence, unspecified, uncomplicated: Secondary | ICD-10-CM

## 2021-12-23 DIAGNOSIS — Z0001 Encounter for general adult medical examination with abnormal findings: Secondary | ICD-10-CM

## 2021-12-23 DIAGNOSIS — Z0000 Encounter for general adult medical examination without abnormal findings: Secondary | ICD-10-CM

## 2021-12-23 DIAGNOSIS — E46 Unspecified protein-calorie malnutrition: Secondary | ICD-10-CM

## 2021-12-23 DIAGNOSIS — E41 Nutritional marasmus: Secondary | ICD-10-CM

## 2021-12-23 DIAGNOSIS — I700 Atherosclerosis of aorta: Secondary | ICD-10-CM | POA: Diagnosis not present

## 2021-12-23 DIAGNOSIS — Z23 Encounter for immunization: Secondary | ICD-10-CM

## 2021-12-23 DIAGNOSIS — D509 Iron deficiency anemia, unspecified: Secondary | ICD-10-CM

## 2021-12-23 DIAGNOSIS — Z1211 Encounter for screening for malignant neoplasm of colon: Secondary | ICD-10-CM | POA: Diagnosis not present

## 2021-12-23 DIAGNOSIS — K7031 Alcoholic cirrhosis of liver with ascites: Secondary | ICD-10-CM | POA: Diagnosis not present

## 2021-12-23 DIAGNOSIS — J432 Centrilobular emphysema: Secondary | ICD-10-CM | POA: Diagnosis not present

## 2021-12-23 DIAGNOSIS — I2510 Atherosclerotic heart disease of native coronary artery without angina pectoris: Secondary | ICD-10-CM

## 2021-12-23 DIAGNOSIS — E118 Type 2 diabetes mellitus with unspecified complications: Secondary | ICD-10-CM | POA: Diagnosis not present

## 2021-12-23 DIAGNOSIS — E2839 Other primary ovarian failure: Secondary | ICD-10-CM

## 2021-12-23 DIAGNOSIS — E785 Hyperlipidemia, unspecified: Secondary | ICD-10-CM

## 2021-12-23 DIAGNOSIS — E559 Vitamin D deficiency, unspecified: Secondary | ICD-10-CM

## 2021-12-23 DIAGNOSIS — W5501XA Bitten by cat, initial encounter: Secondary | ICD-10-CM

## 2021-12-23 DIAGNOSIS — C3431 Malignant neoplasm of lower lobe, right bronchus or lung: Secondary | ICD-10-CM

## 2021-12-23 DIAGNOSIS — I8510 Secondary esophageal varices without bleeding: Secondary | ICD-10-CM

## 2021-12-23 DIAGNOSIS — S41152A Open bite of left upper arm, initial encounter: Secondary | ICD-10-CM | POA: Insufficient documentation

## 2021-12-23 MED ORDER — AMOXICILLIN-POT CLAVULANATE 875-125 MG PO TABS: 1.0000 | ORAL_TABLET | Freq: Two times a day (BID) | ORAL | 0 refills | Status: DC

## 2021-12-23 NOTE — Assessment & Plan Note (Signed)
Appetite better/eating more real food so a1c is up a bit Lab Results  Component Value Date   HGBA1C 6.6 (H) 12/21/2021   Still well controlled however  Intol of statins  Taking metformin xr 500 mg daily  Due for microalbumin next time

## 2021-12-23 NOTE — Assessment & Plan Note (Signed)
Appetite has improved More regular diet  Wt up to 115  Enc her to get protein

## 2021-12-23 NOTE — Assessment & Plan Note (Signed)
Planning to start repatha from cardiology  Intol of statin and h/o cirrhosis

## 2021-12-23 NOTE — Assessment & Plan Note (Signed)
Back to smoking 1ppd  No change in breathing status per pt  Enc her to work on cutting back asap

## 2021-12-23 NOTE — Assessment & Plan Note (Signed)
Pt has discussed starting repatha with cardiology for this and elevated ca score

## 2021-12-23 NOTE — Patient Instructions (Addendum)
If you are interested in the new shingles vaccine (Shingrix) - call your local pharmacy to check on coverage and availability  If affordable, get on a wait list at your pharmacy to get the vaccine.  Call the cardiology office to get started on the repatha injection   For the cat bite take augmentin as directed (for infection)  Keep it clean  Let us know if you have increased redness or swelling or pain   Please make your appt for annual eye exam    Call the breast center to schedule your bone density test  Please call the location of your choice from the menu below to schedule your Mammogram and/or Bone Density appointment.    [facility]                      Phone:  [phone], [place] [address]                                                             Services: Traditional and 3D Mammogram, [place] Bone Density                 Phone: [phone] [address], [place] [address]    Service: Bone Density ONLY   *this site does NOT perform mammograms  [facility]                        Phone:  [phone] [address], [place] [address]                                            Services:  3D Mammogram and [place] at [facility]   Phone:  [phone]   [place], [place] [address]  Services: 3D Mammogram and Bone Density  [place] at [facility])  Phone:  [phone]   [address] [place], [place] [address]                                               Services:  3D Mammogram and Bone Density

## 2021-12-23 NOTE — Assessment & Plan Note (Signed)
Small puncture bite from this am well cleaned  Own cat/no worries about rabies Discussed wound care  Px augmentin to cover poss pasturella inst to call if inc red/pain /swelling or any drainage ER parameters rev

## 2021-12-23 NOTE — Assessment & Plan Note (Signed)
Appetite is better Starting to ear more normal foods and protein

## 2021-12-23 NOTE — Assessment & Plan Note (Signed)
Disc goals for lipids and reasons to control them Rev last labs with pt Rev low sat fat diet in detail Intol of statin/also h/o liver dz  Met with cardiology and discussed repatha  In setting of known cad  Agree that it would be a good option Pt plans to start it

## 2021-12-23 NOTE — Assessment & Plan Note (Signed)
Reviewed health habits including diet and exercise and skin cancer prevention Reviewed appropriate screening tests for age  Also reviewed health mt list, fam hx and immunization status , as well as social and family history   See HPI Labs reviewed  Flu shot given  Plans shingrix at pharm if covered  Plans to schedule eye exam Colonoscopy due in 07/2022 Disc need for smoking cessation  Working on adv directive with pall care dexa ordered-pt will schedule  In mental health care

## 2021-12-23 NOTE — Assessment & Plan Note (Signed)
Doing well with iron infusions  From colon AVM

## 2021-12-23 NOTE — Assessment & Plan Note (Signed)
No alcohol in the past year  No issues  Under care of GI and taking beta blocker

## 2021-12-23 NOTE — Assessment & Plan Note (Signed)
Will be due for colonoscopy in April 2024  Pt aware Under GI care

## 2021-12-23 NOTE — Assessment & Plan Note (Signed)
dexa ordered Taking vit d No falls or fx  Is working on exercise
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
## 2021-12-23 NOTE — Assessment & Plan Note (Signed)
Stable  Doing well after rad tx  In palliative care which she really likes

## 2021-12-23 NOTE — Assessment & Plan Note (Signed)
Level of 96  2000 iu daily  Vitamin D level is therapeutic with current supplementation Disc importance of this to bone and overall health

## 2021-12-23 NOTE — Assessment & Plan Note (Signed)
In setting of copd and current lung cancer and CAD  Disc in detail risks of smoking and possible outcomes including copd, vascular/ heart disease, cancer , respiratory and sinus infections  Pt voices understanding She is not ready to quit  1ppd current

## 2021-12-23 NOTE — Assessment & Plan Note (Signed)
No etoh  Under care of GI  Liver tests wnl

## 2021-12-24 ENCOUNTER — Ambulatory Visit: Payer: Medicare HMO

## 2021-12-24 DIAGNOSIS — Z789 Other specified health status: Secondary | ICD-10-CM
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
## 2021-12-28 ENCOUNTER — Inpatient Hospital Stay: Payer: Medicare HMO

## 2021-12-28 VITALS — BP 111/81 | HR 77 | Temp 97.3°F | Resp 18

## 2021-12-28 DIAGNOSIS — K5520 Angiodysplasia of colon without hemorrhage: Secondary | ICD-10-CM

## 2021-12-28 DIAGNOSIS — D500 Iron deficiency anemia secondary to blood loss (chronic): Secondary | ICD-10-CM | POA: Diagnosis not present

## 2021-12-28 DIAGNOSIS — K922 Gastrointestinal hemorrhage, unspecified: Secondary | ICD-10-CM | POA: Diagnosis not present

## 2021-12-28 MED ORDER — SODIUM CHLORIDE 0.9 % IV SOLN
Freq: Once | INTRAVENOUS | Status: AC
  Filled 2021-12-28: qty 250

## 2021-12-28 MED ORDER — SODIUM CHLORIDE 0.9 % IV SOLN
300.0000 mg | INTRAVENOUS | Status: DC
  Administered 2021-12-28: 300 mg via INTRAVENOUS
  Filled 2021-12-28: qty 300

## 2021-12-31 ENCOUNTER — Telehealth: Payer: Self-pay

## 2021-12-31 NOTE — Patient Outreach (Addendum)
  Care Coordination   Initial Visit Note   12/31/2021 Name: Christine Barton MRN: [id number] DOB: [birth year]  Christine Barton is a 71 y.o. year old female who sees Tower, [name], MD for primary care. I spoke with  [name] by phone today.  What matters to the patients health and wellness today?  Patient states she would like to know more about her cirrhosis. She states she is totally unaware of how this will effect her in the long run or as it progresses.   Patient states she had cataract surgery to her left eye approximately 4-5 years ago.  She states her eye was damaged from the procedure which has resulted in decrease vision.  Patient states she has a cataract in her right eye and is noticing more symptoms such as occasional blurred vision.  She states she is hesitant to have cataract surgery to her right eye due to the previous traumatic experience with her left.     Goals Addressed             This Visit's Progress    Patient stated: Maintain and/ or improve health/ health education       Care Coordination Interventions: Patient advised to call and set up appointment with ophthalmologist Patient referred to social worker Advised to review mailed education information on cirrhosis of the liver Active listening / Allowed patient to express concerns/ feelings.            SDOH assessments and interventions completed:  Yes  SDOH Interventions Today    Flowsheet Row Most Recent Value  SDOH Interventions   Food Insecurity Interventions Intervention Not Indicated  Housing Interventions Intervention Not Indicated  Transportation Interventions Intervention Not Indicated  Depression Interventions/Treatment  Medication  [patient agreeable to referral to social worker]        Care Coordination Interventions Activated:  Yes  Care Coordination Interventions:  Yes, provided   Follow up plan: Follow up call scheduled for 02/18/22 at 3:00 pm    Encounter Outcome:  Pt. Visit  Completed   Quinn Plowman RN,BSN,CCM [place] [phone] direct line

## 2022-01-01 ENCOUNTER — Telehealth: Payer: Self-pay

## 2022-01-01 NOTE — Patient Outreach (Signed)
  Care Coordination   Follow Up Visit Note   01/01/2022 Name: Christine Barton MRN: [id number] DOB: [birth year]  Christine Barton is a 71 y.o. year old female who sees Tower, [name], MD for primary care. I spoke with  Christine Barton by phone today.  What matters to the patients health and wellness today?  Returned call to patient at her request.  Patient explained she canceled her appointment with the social worker because she would like to access counseling services outside of the [facility] health system.  Patient states she would like to continue with follow from the RN case manager.     Goals Addressed             This Visit's Progress    Patient stated: Maintain and/ or improve health/ health education       Care Coordination Interventions: Explained social work referral process to patient and explained Education officer, museum can provide counseling resources outside of the [employer] system.               SDOH assessments and interventions completed:  No     Care Coordination Interventions Activated:  Yes  Care Coordination Interventions:  Yes, provided   Follow up plan:  as previously scheduled    Encounter Outcome:  Pt. Visit Completed   Quinn Plowman RN,BSN,CCM [place] [phone] direct line

## 2022-01-03 ENCOUNTER — Other Ambulatory Visit: Payer: Self-pay | Admitting: Family Medicine

## 2022-01-03 ENCOUNTER — Other Ambulatory Visit: Payer: Self-pay | Admitting: Gastroenterology

## 2022-01-04 ENCOUNTER — Inpatient Hospital Stay: Payer: Medicare HMO

## 2022-01-04 VITALS — BP 127/68 | HR 76 | Temp 97.5°F | Resp 16

## 2022-01-04 DIAGNOSIS — D500 Iron deficiency anemia secondary to blood loss (chronic): Secondary | ICD-10-CM

## 2022-01-04 DIAGNOSIS — K5520 Angiodysplasia of colon without hemorrhage: Secondary | ICD-10-CM

## 2022-01-04 DIAGNOSIS — K922 Gastrointestinal hemorrhage, unspecified: Secondary | ICD-10-CM | POA: Diagnosis not present

## 2022-01-04 MED ORDER — SODIUM CHLORIDE 0.9 % IV SOLN
300.0000 mg | INTRAVENOUS | Status: DC
  Administered 2022-01-04: 300 mg via INTRAVENOUS
  Filled 2022-01-04: qty 300

## 2022-01-04 MED ORDER — SODIUM CHLORIDE 0.9 % IV SOLN
Freq: Once | INTRAVENOUS | Status: AC
  Filled 2022-01-04: qty 250

## 2022-01-07 ENCOUNTER — Encounter (HOSPITAL_COMMUNITY): Payer: Self-pay

## 2022-01-07 ENCOUNTER — Encounter: Payer: Medicare HMO | Admitting: *Deleted

## 2022-01-07 ENCOUNTER — Ambulatory Visit (HOSPITAL_COMMUNITY)
Admission: RE | Admit: 2022-01-07 | Discharge: 2022-01-07 | Disposition: A | Discharge status: Home / Self Care | Payer: Medicare HMO | Source: Ambulatory Visit | Attending: Gastroenterology | Admitting: Gastroenterology

## 2022-01-07 DIAGNOSIS — K7030 Alcoholic cirrhosis of liver without ascites: Secondary | ICD-10-CM

## 2022-01-07 DIAGNOSIS — K760 Fatty (change of) liver, not elsewhere classified: Secondary | ICD-10-CM

## 2022-01-12 ENCOUNTER — Ambulatory Visit (HOSPITAL_COMMUNITY)
Admission: RE | Admit: 2022-01-12 | Discharge: 2022-01-12 | Disposition: A | Discharge status: Home / Self Care | Payer: Medicare HMO | Source: Ambulatory Visit | Attending: Gastroenterology | Admitting: Gastroenterology

## 2022-01-12 DIAGNOSIS — K7030 Alcoholic cirrhosis of liver without ascites: Secondary | ICD-10-CM | POA: Insufficient documentation

## 2022-01-12 DIAGNOSIS — K760 Fatty (change of) liver, not elsewhere classified: Secondary | ICD-10-CM | POA: Insufficient documentation

## 2022-01-12 DIAGNOSIS — K7469 Other cirrhosis of liver: Secondary | ICD-10-CM | POA: Diagnosis not present

## 2022-01-12 DIAGNOSIS — Z853 Personal history of malignant neoplasm of breast: Secondary | ICD-10-CM | POA: Diagnosis not present

## 2022-01-12 DIAGNOSIS — Z85118 Personal history of other malignant neoplasm of bronchus and lung: Secondary | ICD-10-CM | POA: Diagnosis not present

## 2022-01-12 DIAGNOSIS — K8020 Calculus of gallbladder without cholecystitis without obstruction: Secondary | ICD-10-CM | POA: Diagnosis not present

## 2022-01-12 MED ORDER — GADOPICLENOL 0.5 MMOL/ML IV SOLN
5.0000 mL | Freq: Once | INTRAVENOUS | Status: AC | PRN
  Administered 2022-01-12: 5 mL via INTRAVENOUS

## 2022-01-15 ENCOUNTER — Encounter: Payer: Self-pay | Admitting: Gastroenterology

## 2022-01-15 ENCOUNTER — Ambulatory Visit: Payer: Medicare HMO | Admitting: Gastroenterology

## 2022-01-15 VITALS — BP 136/76 | HR 72 | Ht 64.0 in | Wt 114.6 lb

## 2022-01-15 DIAGNOSIS — K7030 Alcoholic cirrhosis of liver without ascites: Secondary | ICD-10-CM | POA: Diagnosis not present

## 2022-01-15 NOTE — Patient Instructions (Addendum)
_______________________________________________________  If you are age 71 or older, your body mass index should be between 23-30. Your Body mass index is 19.66 kg/m. If this is out of the aforementioned range listed, please consider follow up with your Primary Care Provider. ________________________________________________________  The [facility] providers would like to encourage you to use [facility] to communicate with providers for non-urgent requests or questions.  Due to long hold times on the telephone, sending your provider a message by [facility] may be a faster and more efficient way to get a response.  Please allow 48 business hours for a response.  Please remember that this is for non-urgent requests.  _______________________________________________________  Christine Barton will need a follow up in 6 months( April 2024).  We will contact you to get this scheduled.  Thank you for entrusting me with your care and choosing [facility]  Dr [name]

## 2022-01-15 NOTE — Progress Notes (Signed)
Christine Barton    [id number]    [birth year]  Primary Care Physician:Tower, [name], MD  Referring Physician: Tower, [name], MD [place],  [place] [id number]   Chief complaint:  Cirrhosis  HPI:  71 year old very pleasant female with history of breast cancer, lung cancer, type 2 diabetes, COPD, chronic anemia secondary to occult GI blood loss and decompensated cirrhosis here for follow-up visit   Left side rib pain and abdominal pain is improving since she is doing physical therapy.  She completed radiation therapy for lung cancer   She is experiencing intermittent constipation.  P.o. intake is improving   Denies any nausea, vomiting, melena or bright red blood per rectum.  No other episodes of confusion, lower extremity edema or worsening abdominal distention.    She is receiving IV iron infusion, hemoglobin on saturation.  She is being managed by hematology   MRI abdomen/liver December 31, 2020 1. Cirrhosis. No liver masses. 2. Trace perihepatic ascites.  Normal size spleen. 3. Cholelithiasis. Chronic mild central intrahepatic and extrahepatic biliary ductal dilatation, unchanged. CBD diameter 9 mm. No evidence of choledocholithiasis. 4. Chronic mild dilation of the dorsal pancreatic duct in the pancreatic head, unchanged, nonspecific. No pancreatic mass.   Abdominal ultrasound April 30, 2020 1. Cirrhotic liver morphology.  No focal liver lesion is identified. 2. Cholelithiasis with a positive sonographic Murphy sign. Findings raise suspicion for acute cholecystitis.   Normal AFP 3.7 in January 2022 MRI liver 01-08-2020: Stable cirrhosis with no evidence of hepatocellular carcinoma. Choledocholithiasis with stable CBD. Increased iron deposition in the setting of IV iron therapy   EGD July 27, 2019 by Dr. Havery Moros: Small esophageal varices and portal hypertensive gastropathy otherwise normal exam   Colonoscopy July 27, 2019: 8 small  AVMs scattered in the colon ablated with APC and 3 small polyps removed, diverticulosis and internal hemorrhoids   EGD February 23, 2019: Small grade 1 esophageal varices, portal hypertensive gastropathy   Colonoscopy November 30, 2016: Diverticulosis, sessile polyps X4 tubular adenomas and internal hemorrhoids.  Recall colonoscopy in 3 years        Outpatient Encounter Medications as of 01/15/2022  Medication Sig   albuterol (VENTOLIN HFA) 108 (90 Base) MCG/ACT inhaler INHALE 2 PUFFS INTO THE LUNGS EVERY 4 HOURS AS NEEDED FOR WHEEZE OR FOR SHORTNESS OF BREATH   amoxicillin-clavulanate (AUGMENTIN) 875-125 MG tablet Take 1 tablet by mouth 2 (two) times daily.   budesonide-formoterol (SYMBICORT) 160-4.5 MCG/ACT inhaler Inhale 2 puffs into the lungs in the morning and at bedtime.   cholecalciferol (VITAMIN D3) 25 MCG (1000 UNIT) tablet Take 1,000 Units by mouth 2 (two) times daily.   clorazepate (TRANXENE) 3.75 MG tablet Take 1 tablet (3.75 mg total) by mouth 2 (two) times daily as needed for anxiety.   cyclobenzaprine (FLEXERIL) 5 MG tablet Take 1 tablet (5 mg total) by mouth at bedtime.   furosemide (LASIX) 20 MG tablet TAKE 1 TABLET BY MOUTH EVERY DAY   lactulose (CHRONULAC) 10 GM/15ML solution Take 15 mLs (10 g total) by mouth 3 (three) times daily.   metFORMIN (GLUCOPHAGE-XR) 500 MG 24 hr tablet TAKE 1 TABLET BY MOUTH EVERY DAY WITH BREAKFAST   Multiple Vitamin (MULTIVITAMIN) tablet Take 1 tablet by mouth daily.   Multiple Vitamins-Minerals (PRESERVISION AREDS 2) CAPS Take 1 capsule by mouth 2 (two) times daily.   mupirocin ointment (BACTROBAN) 2 % APPLY 1 APPLICATION TOPICALLY 2 (TWO) TIMES DAILY.  TO AFFECTED AREAS   nadolol (CORGARD) 20 MG tablet TAKE 1 TABLET BY MOUTH EVERY DAY   nystatin (MYCOSTATIN) 100000 UNIT/ML suspension TAKE 5 MLS (500,000 UNITS TOTAL) BY MOUTH 3 (THREE) TIMES DAILY. SWISH AND SWALLOW   pantoprazole (PROTONIX) 40 MG tablet TAKE 1 TABLET (40 MG TOTAL) BY MOUTH 2  (TWO) TIMES DAILY BEFORE A MEAL. (Patient taking differently: Take 40 mg by mouth daily.)   rifaximin (XIFAXAN) 550 MG TABS tablet Take 1 tablet (550 mg total) by mouth 2 (two) times daily.   SPIRIVA RESPIMAT 2.5 MCG/ACT AERS SMARTSIG:2 Puff(s) Via Inhaler Daily   SYSTANE COMPLETE 0.6 % SOLN Apply 1 drop to eye 2 (two) times daily.   traMADol (ULTRAM) 50 MG tablet Take 1 tablet (50 mg total) by mouth every 8 (eight) hours as needed for severe pain.   ursodiol (ACTIGALL) 300 MG capsule TAKE 1 CAPSULE BY MOUTH EVERY DAY   No facility-administered encounter medications on file as of 01/15/2022.    Allergies as of 01/15/2022 - Review Complete 01/15/2022  Allergen Reaction Noted   Oxycodone Nausea And Vomiting 11/02/2014   Crestor [rosuvastatin]  11/12/2020   Glipizide Other (See Comments) 09/12/2013   Prednisone  06/15/2021    Past Medical History:  Diagnosis Date   Alcohol abuse, unspecified    Breast cancer ([place]) 1998   Right   Cataract    left eye   Cervical spondylosis 2006   MRI   Degeneration of cervical intervertebral disc 2006   MRI   Diabetes mellitus without complication ([place])    Diverticulosis of colon (without mention of hemorrhage)    Hyperpotassemia    Lung cancer ([place]) 03/2021   started radiation in January 2023, right side   Microscopic hematuria    Mononeuritis of unspecified site    Nonspecific abnormal results of liver function study    Other abnormal glucose    Other and unspecified hyperlipidemia    no per pt   Other chronic nonalcoholic liver disease    Personal history of chemotherapy    Personal history of malignant neoplasm of breast    Personal history of radiation therapy    Pneumothorax, acute    right, spontaneous   Tobacco use disorder    Unspecified vitamin D deficiency     Past Surgical History:  Procedure Laterality Date   BIOPSY  07/27/2019   Procedure: BIOPSY;  Surgeon: Yetta Flock, MD;  Location: [facility];  Service:  Gastroenterology;;   BREAST BIOPSY  9/03   Right   BREAST LUMPECTOMY Right 1998   CHEST TUBE INSERTION  11/02/2014   COLONOSCOPY     COLONOSCOPY WITH PROPOFOL N/A 07/27/2019   Procedure: COLONOSCOPY WITH PROPOFOL;  Surgeon: Yetta Flock, MD;  Location: [facility];  Service: Gastroenterology;  Laterality: N/A;   ESOPHAGOGASTRODUODENOSCOPY (EGD) WITH PROPOFOL N/A 10/30/2018   Procedure: ESOPHAGOGASTRODUODENOSCOPY (EGD) WITH PROPOFOL;  Surgeon: Mauri Pole, MD;  Location: [facility];  Service: Endoscopy;  Laterality: N/A;   ESOPHAGOGASTRODUODENOSCOPY (EGD) WITH PROPOFOL N/A 03/12/2019   Procedure: ESOPHAGOGASTRODUODENOSCOPY (EGD) WITH PROPOFOL;  Surgeon: Mauri Pole, MD;  Location: [facility];  Service: Endoscopy;  Laterality: N/A;   ESOPHAGOGASTRODUODENOSCOPY (EGD) WITH PROPOFOL N/A 07/27/2019   Procedure: ESOPHAGOGASTRODUODENOSCOPY (EGD) WITH PROPOFOL;  Surgeon: Yetta Flock, MD;  Location: [facility];  Service: Gastroenterology;  Laterality: N/A;   EYE SURGERY  02/2017   cataract extraction with lens implant-left   HOT HEMOSTASIS N/A 07/27/2019   Procedure: HOT HEMOSTASIS (ARGON PLASMA COAGULATION/BICAP);  Surgeon: Yetta Flock, MD;  Location: [facility];  Service: Gastroenterology;  Laterality: N/A;   MOUTH SURGERY     POLYPECTOMY  07/27/2019   Procedure: POLYPECTOMY;  Surgeon: Yetta Flock, MD;  Location: [facility];  Service: Gastroenterology;;   TUBAL LIGATION      Family History  Problem Relation Age of Onset   Heart failure Father    Heart attack Father    Colon cancer Maternal Uncle    Stroke Mother    Esophageal cancer Neg Hx    Rectal cancer Neg Hx    Stomach cancer Neg Hx    Pancreatic cancer Neg Hx     Social History   Socioeconomic History   Marital status: Single    Spouse name: Not on file   Number of children: 1   Years of education: Not on file   Highest education level: Not on file  Occupational History    Occupation: retired    Fish farm manager: [employer]  Tobacco Use   Smoking status: Every Day    Packs/day: 1.00    Years: 58.00    Total pack years: 58.00    Types: Cigarettes    Start date: 09/25/2021    Passive exposure: Past   Smokeless tobacco: Never   Tobacco comments:    0.5 ppd - 04/14/21    09/23/2021- Patient smokes 3 cigarettes daily   Vaping Use   Vaping Use: Never used  Substance and Sexual Activity   Alcohol use: Not Currently   Drug use: No   Sexual activity: Not Currently  Other Topics Concern   Not on file  Social History Narrative   Divorced      1 child      Works at [employer] Strain: [place]  (11/07/2020)   Overall Financial Resource Strain (CARDIA)    Difficulty of Paying Living Expenses: Not hard at all  Food Insecurity: No Food Insecurity (12/31/2021)   Hunger Vital Sign    Worried About Running Out of Food in the Last Year: Never true    [place] in the Last Year: Never true  Transportation Needs: No Transportation Needs (12/31/2021)   PRAPARE - Hydrologist (Medical): No    Lack of Transportation (Non-Medical): No  Physical Activity: Inactive (11/07/2020)   Exercise Vital Sign    Days of Exercise per Week: 0 days    Minutes of Exercise per Session: 0 min  Stress: No Stress Concern Present (11/07/2020)   [place]    Feeling of Stress : Not at all  Social Connections: Not on file  Intimate Partner Violence: Not At Risk (11/07/2020)   Humiliation, Afraid, Rape, and Kick questionnaire    Fear of Current or Ex-Partner: No    Emotionally Abused: No    Physically Abused: No    Sexually Abused: No      Review of systems: All other review of systems negative except as mentioned in the HPI.   Physical Exam: Vitals:   01/15/22 1400  BP: 136/76  Pulse: 72   Body mass index  is 19.66 kg/m. Gen:      No acute distress HEENT:  sclera anicteric Abd:      soft, non-tender; no palpable masses, no distension Ext:    No edema Neuro: alert and oriented x 3 Psych:  normal mood and affect  Data Reviewed:  Reviewed labs, radiology imaging, old records and pertinent past GI work up   Assessment and Plan/Recommendations:  71 year old very pleasant female with history of NASH and EtOH cirrhosis      MELD-Na: 7    Chronic idiopathic constipation: Use lactulose twice daily.  She already has the prescription for hepatic encephalopathy Increase dietary fiber and water intake     No clinical evidence of hepatic decompensation Overall stable from cirrhosis standpoint   Left rib pain/costochondritis arthritis: History of lung cancer s/p radiation treatment continue physical therapy Ligament pulmonary nodule, undergoing radiation therapy     Epigastric abdominal pain: Secondary to large symptomatic gallstone, she is not a surgical candidate Continue ursodiol 300 mg daily monitor symptoms   Iron deficiency anemia: Improved with IV iron infusion Chronic GI blood loss secondary to small bowel AVMs Continue to monitor CBC, iron panel and IV iron infusion as needed.  She is being managed by hematology   Hepatic encephalopathy: Stable Continue Xifaxan and advised patient to take lactulose as needed if she has change in mood or gait.  Also advised her to take lactulose if she has less than 2 or 3 soft bowel movements per day   No evidence of volume overload or ascites on exam, continue low-dose furosemide Advised patient to stop spironolactone, noted elevated potassium 5.3 on prior labs   [facility] screening: No lesions concerning for [facility] on recent MRI.    Continue to abstain from EtOH Discussed smoking cessation   Return in 6 months or sooner if needed  The patient was provided an opportunity to ask questions and all were answered. The patient agreed with the plan and  demonstrated an understanding of the instructions.  Damaris Hippo , MD    CC: Tower, [name], MD

## 2022-01-20 ENCOUNTER — Other Ambulatory Visit: Payer: Self-pay | Admitting: Gastroenterology

## 2022-01-20 ENCOUNTER — Other Ambulatory Visit: Payer: Self-pay | Admitting: Family Medicine

## 2022-01-20 ENCOUNTER — Other Ambulatory Visit: Payer: Self-pay | Admitting: Family

## 2022-01-28 ENCOUNTER — Ambulatory Visit: Payer: Medicare HMO | Admitting: Pulmonary Disease

## 2022-02-03 ENCOUNTER — Ambulatory Visit: Payer: Medicare HMO

## 2022-02-08 ENCOUNTER — Encounter: Payer: Self-pay | Admitting: Gastroenterology

## 2022-02-08 ENCOUNTER — Ambulatory Visit: Payer: Medicare HMO | Admitting: Radiation Oncology

## 2022-02-17 ENCOUNTER — Telehealth: Payer: Self-pay | Admitting: Family Medicine

## 2022-02-17 NOTE — Telephone Encounter (Signed)
Patient will call back to schedule AWV with [facility]

## 2022-02-18 ENCOUNTER — Ambulatory Visit: Payer: Self-pay

## 2022-02-18 NOTE — Patient Outreach (Signed)
  Care Coordination   02/18/2022 Name: Christine Barton MRN: [id number] DOB: 10-20-1950   Care Coordination Outreach Attempts:  An unsuccessful telephone outreach was attempted for a scheduled appointment today.  Follow Up Plan:  Additional outreach attempts will be made to offer the patient care coordination information and services.   Encounter Outcome:  No Answer  Care Coordination Interventions Activated:  No   Care Coordination Interventions:  No, not indicated     Quinn Plowman RN,BSN,CCM [place] [phone] direct line

## 2022-02-22 ENCOUNTER — Telehealth: Payer: Self-pay | Admitting: *Deleted

## 2022-02-22 NOTE — Progress Notes (Signed)
  Care Coordination Note  02/22/2022 Name: Christine Barton MRN: [id number] DOB: 04/30/1950  Christine Barton is a 71 y.o. year old female who is a primary care patient of Tower, [name], MD and is actively engaged with the care management team. I reached out to [name] by phone today to assist with re-scheduling a follow up visit with the RN Case Manager  Follow up plan: Unsuccessful telephone outreach attempt made. A HIPAA compliant phone message was left for the patient providing contact information and requesting a return call.   Julian Hy, [place] Direct Dial: [phone]

## 2022-02-23 ENCOUNTER — Ambulatory Visit (INDEPENDENT_AMBULATORY_CARE_PROVIDER_SITE_OTHER): Payer: Medicare HMO | Admitting: Family Medicine

## 2022-02-23 ENCOUNTER — Encounter: Payer: Self-pay | Admitting: Family Medicine

## 2022-02-23 VITALS — Wt 115.0 lb

## 2022-02-23 DIAGNOSIS — F17200 Nicotine dependence, unspecified, uncomplicated: Secondary | ICD-10-CM

## 2022-02-23 DIAGNOSIS — Z0000 Encounter for general adult medical examination without abnormal findings: Secondary | ICD-10-CM | POA: Diagnosis not present

## 2022-02-23 NOTE — Patient Instructions (Addendum)
CHECKLIST FOR A HEALTHY LIFE:  Congratulations on wanting to quit smoking! It is one of the most important things you can do right now to improve your health dramatically! -CALL the quit line today -set a quit day -use the nicotine patches and lozenges: start on your quit day with the 21 mg patch for the first 6 weeks, then the 14 mg patch for 2 weeks, then the 25m patch for 2 weeks The 232mpatch will give you about the amount of nicotine in one pack of cigarettes. -IF you still have cravings when on the patch: 1) drink a few sips of water slowly 2) do your balance exercises 3) use the nicotine lozenge if needed -call the quit line or schedule an appointment with your doctor (or me) if you are struggling -do not give up - sometimes it can take multiple tries at quitting - that is ok, eventually you will succeed if you keep trying. -follow up 1 week after you quit date with your doctor or me   -Eat a healthy whole foods based diet, get regular physical activity, manage stress and engage in social connections - see below for specific suggestions and more information.  -Vaccines due:  -check with your doctor about the yearly covid vaccine and shingles vaccines, you can get theses vaccines at the pharmacy  -Tests and screenings due:  -with your doctors at follow up visits. Follow up with your primary doctor every 3-4 months.   -Referrals:  -none  -See a dentist at least yearly  -Get your eyes checked   -Other issues addressed today: -healthy whole food plant heavy diet (see below for details) -add the balance exercises we discussed 3 days per week - [facility], start very light and slow, gradually increase, continue with the walking!!!  -Follow up: -yearly for annual wellness visit with primary care  office  -----------------------------------------------------------------------------------------------------------------------------------------------------------------------------------------------------------------------------------------------------------  [place]-eat real food: lots of colorful vegetables (half the plate) -consume on a regular basis: whole grains (make sure first ingredient on label contains the word "whole"), fresh fruits, fish, nuts, seeds, healthy oils (such as olive oil, avocado oil ) -may eat small amounts of dairy and lean meat on occasion, but avoid processed meats such as ham, bacon, lunch meat, etc. -drink water -try to avoid fast food and pre-packaged foods, processed meat -try to avoid foods that contain any ingredients with names you do not recognize  -try to avoid sugar/sweets (except for the natural sugar that occurs in fresh fruit) -try to avoid sweet drinks -try to avoid white rice, white bread, pasta (unless whole grain), white or yellow potatoes  MOVE - the key to keeping your body moving and working best: -gradually increase intentional physical activity -move and stretch your body, legs, feet and arms when sitting for long periods -try to get at least 150 minutes per week, 20-30 minutes of sustained activity or two 10 minute episodes of sustained activity every day. If you need assistance with exercise you could consider the following community options: -balance exercises 3+ days per week -strength training 2 days per week   -Silver sneakers https://tools.silversneakers.com  -Walk with a Doc: Hthttp://stephens-thompson.biz/-try to include resistance (weight lifting/strength building) and balance exercises twice per week: or the following link for ideas: htChessContest.frhtUpdateClothing.com.cySTRESS MANAGEMENT - so  important for health and well being -try meditating, or just sitting quietly with deep breathing while  intentionally relaxing all parts of your body for 5 minutes daily  SOCIAL CONNECTIONS: -options in [place] if you wish to engage in more social and exercise related activities:  -Silver sneakers https://tools.silversneakers.com  -Walk with a Doc: http://stephens-thompson.biz/  -Check out the [place] 50+ section on the [place] [employer] (hiking clubs, book clubs, cards and games, chess, exercise classes, aquatic classes and much more) - see the website for details: https://www.[employer].gov/departments/parks-recreation/active-adults50  -YouTube has lots of exercise videos for different ages and abilities as well  -[place] (a variety of indoor and outdoor inperson activities for adults). [phone]. [address]  -Virtual Online Classes (a variety of topics): see seniorplanet.org or call ([phone]  -consider volunteering at a school, hospice center, church, senior center or elsewhere

## 2022-02-23 NOTE — Progress Notes (Signed)
PATIENT CHECK-IN and HEALTH RISK ASSESSMENT QUESTIONNAIRE:  -completed by phone/video for upcoming Medicare Preventive Visit  Pre-Visit Check-in: 1)Vitals (height, wt, BP, etc) - record in vitals section for visit on day of visit 2)Review and Update Medications, Allergies PMH, Surgeries, Social history in Epic 3)Hospitalizations in the last year with date/reason? none  4)Review and Update Care Team (patient's specialists) in Epic 5) Complete PHQ9 in Epic  6) Complete Fall Screening in Epic 7)Review all Health Maintenance Due and order under PCP if not done.  8)Medicare Wellness Questionnaire: Answer theses question about your habits: Do you drink alcohol?  no How many drinks do you have a day?none Have you ever smoked? yes How many packs a day do you smoke? 1 Do you use smokeless tobacco? never Do you use an illicit drugs? no Do you exercises? Yes IF so, what type and how many days/minutes per week? 30 min a day Are you sexually active? No  What did you eat for breakfast today (or yesterday)? Christine Barton and eggs and toast What did you eat for lunch today (or yesterday)?  [place] sub What did you eat for diner today (or yesterday)? [place] sub  Typical snacks: not much of a snack  What beverages do you drink besides water: coffee, milk  Answer theses question about you: Can you perform most household chores? yes Do you find it hard to follow a conversation in a noisy room? yes Do you find it hard to understand a speaker at church or in a meeting?no Do you often ask people to speak up or repeat themselves?yes Do you experience ringing in your ears?occasionally  Do you have difficulty understanding a soft or whispered voice?yes Do you feel that you have a problem with memory?yes Do you often misplace items?yes Do you balance your checkbook and or bank acounts?yes Do you feel safe at home?yes Last dentist visit? Dentures top and bottom 2 years ago Do you need assistance with any of  the following:  Driving?no  Feeding yourself?no  Getting from bed to chair?no  Getting to the toilet?no  Bathing or showering?no  Dressing yourself?no  Managing money?no  Climbing a flight of stairs?no  Preparing meals?no  Do you have Advanced Directives in place (Living Will, Healthcare Power or Attorney)? yes   Last eye Exam and location? 2 years ago   Do you currently use prescribed or non-prescribed narcotic or opioid pain medications? no  Do you have a history or close family history of breast, ovarian, tubal or peritoneal cancer or a family member with BRCA (breast cancer susceptibility 1 and 2) gene mutations? No, she has had breast cancer  Nurse/Assistant Credentials/time stamp: Christine Barton CMA    ----------------------------------------------------------------------------------------------------------------------------------------------------------------------------------------------------------------------   MEDICARE ANNUAL PREVENTIVE VISIT WITH PROVIDER: (Welcome to Medicare, initial annual wellness or annual wellness exam)  Virtual Visit via Video Note  I connected with Christine Barton  on 02/23/2022 by a video enabled telemedicine application and verified that I am speaking with the correct person using two identifiers.  Location patient: home Location provider:work or home office Persons participating in the virtual visit: patient, provider  Concerns and/or follow up today:  Wants to quit smoking. Reports is a monster though. Has nicotine patches, lozenges/gum and quitline info. Reports very motivated to quit, but also has struggled with trying to quit in the past. Feels well otherwise. Has been exercising. No falls but feels balance has worsened some as she has aged.   See HM section in Epic for other details of completed HM.  ROS: negative for report of fevers, unintentional weight loss, vision changes, vision loss, hearing loss or change, chest pain, sob,  hemoptysis, melena, hematochezia, hematuria, genital discharge or lesions, falls, bleeding or bruising, loc, thoughts of suicide or self harm, memory loss  Patient-completed extensive health risk assessment - reviewed and discussed with the patient: See Health Risk Assessment completed with patient prior to the visit either above or in recent phone note. This was reviewed in detailed with the patient today and appropriate recommendations, orders and referrals were placed as needed per Summary below and patient instructions.   Review of Medical History: -PMH, [place], Family History and current specialty and care providers reviewed and updated and listed below   Patient Care Team: Barton, Christine Fanny, MD as PCP - General Christine Heinz, MD as PCP - Cardiology (Cardiology) Christine Barton, Christine Barton as Pharmacist (Pharmacist)   Past Medical History:  Diagnosis Date   Alcohol abuse, unspecified    Breast cancer [facility]) 1998   Right   Cataract    left eye   Cervical spondylosis 2006   MRI   Degeneration of cervical intervertebral disc 2006   MRI   Diabetes mellitus without complication ([place])    Diverticulosis of colon (without mention of hemorrhage)    Hyperpotassemia    Lung cancer ([place]) 03/2021   started radiation in January 2023, right side   Microscopic hematuria    Mononeuritis of unspecified site    Nonspecific abnormal results of liver function study    Other abnormal glucose    Other and unspecified hyperlipidemia    no per pt   Other chronic nonalcoholic liver disease    Personal history of chemotherapy    Personal history of malignant neoplasm of breast    Personal history of radiation therapy    Pneumothorax, acute    right, spontaneous   Tobacco use disorder    Unspecified vitamin D deficiency     Past Surgical History:  Procedure Laterality Date   BIOPSY  07/27/2019   Procedure: BIOPSY;  Surgeon: Christine Flock, MD;  Location: [facility];  Service:  Gastroenterology;;   BREAST BIOPSY  9/03   Right   BREAST LUMPECTOMY Right 1998   CHEST TUBE INSERTION  11/02/2014   COLONOSCOPY     COLONOSCOPY WITH PROPOFOL N/A 07/27/2019   Procedure: COLONOSCOPY WITH PROPOFOL;  Surgeon: Christine Flock, MD;  Location: [facility];  Service: Gastroenterology;  Laterality: N/A;   ESOPHAGOGASTRODUODENOSCOPY (EGD) WITH PROPOFOL N/A 10/30/2018   Procedure: ESOPHAGOGASTRODUODENOSCOPY (EGD) WITH PROPOFOL;  Surgeon: Mauri Pole, MD;  Location: [facility];  Service: Endoscopy;  Laterality: N/A;   ESOPHAGOGASTRODUODENOSCOPY (EGD) WITH PROPOFOL N/A 03/12/2019   Procedure: ESOPHAGOGASTRODUODENOSCOPY (EGD) WITH PROPOFOL;  Surgeon: Mauri Pole, MD;  Location: [facility];  Service: Endoscopy;  Laterality: N/A;   ESOPHAGOGASTRODUODENOSCOPY (EGD) WITH PROPOFOL N/A 07/27/2019   Procedure: ESOPHAGOGASTRODUODENOSCOPY (EGD) WITH PROPOFOL;  Surgeon: Christine Flock, MD;  Location: [facility];  Service: Gastroenterology;  Laterality: N/A;   EYE SURGERY  02/2017   cataract extraction with lens implant-left   HOT HEMOSTASIS N/A 07/27/2019   Procedure: HOT HEMOSTASIS (ARGON PLASMA COAGULATION/BICAP);  Surgeon: Christine Flock, MD;  Location: [facility];  Service: Gastroenterology;  Laterality: N/A;   MOUTH SURGERY     POLYPECTOMY  07/27/2019   Procedure: POLYPECTOMY;  Surgeon: Christine Flock, MD;  Location: [facility];  Service: Gastroenterology;;   TUBAL LIGATION      Social History   Socioeconomic History  Marital status: Single    Spouse name: Not on file   Number of children: 1   Years of education: Not on file   Highest education level: Not on file  Occupational History   Occupation: retired    Fish farm manager: [employer]  Tobacco Use   Smoking status: Every Day    Packs/day: 1.00    Years: 58.00    Total pack years: 58.00    Types: Cigarettes    Start date: 09/25/2021    Passive exposure: Past   Smokeless tobacco:  Never   Tobacco comments:    0.5 ppd - 04/14/21    09/23/2021- Patient smokes 3 cigarettes daily   Vaping Use   Vaping Use: Never used  Substance and Sexual Activity   Alcohol use: Not Currently   Drug use: No   Sexual activity: Not Currently  Other Topics Concern   Not on file  Social History Narrative   Divorced      1 child      Works at [employer] Strain: [place]  (11/07/2020)   Overall Financial Resource Strain (CARDIA)    Difficulty of Paying Living Expenses: Not hard at all  Food Insecurity: No [place] (12/31/2021)   Hunger Vital Sign    Worried About Running Out of Food in the Last Year: Never true    [place] in the Last Year: Never true  Transportation Needs: No Transportation Needs (12/31/2021)   PRAPARE - Hydrologist (Medical): No    Lack of Transportation (Non-Medical): No  Physical Activity: Inactive (11/07/2020)   Exercise Vital Sign    Days of Exercise per Week: 0 days    Minutes of Exercise per Session: 0 min  Stress: No Stress Concern Present (11/07/2020)   [place]    Feeling of Stress : Not at all  Social Connections: Not on file  Intimate Partner Violence: Not At Risk (11/07/2020)   Humiliation, Afraid, Rape, and Kick questionnaire    Fear of Current or Ex-Partner: No    Emotionally Abused: No    Physically Abused: No    Sexually Abused: No    Family History  Problem Relation Age of Onset   Heart failure Father    Heart attack Father    Colon cancer Maternal Uncle    Stroke Mother    Esophageal cancer Neg Hx    Rectal cancer Neg Hx    Stomach cancer Neg Hx    Pancreatic cancer Neg Hx     Current Outpatient Medications on File Prior to Visit  Medication Sig Dispense Refill   albuterol (VENTOLIN HFA) 108 (90 Base) MCG/ACT inhaler INHALE 2 PUFFS INTO THE LUNGS EVERY 4  HOURS AS NEEDED FOR WHEEZE OR FOR SHORTNESS OF BREATH 18 each 3   budesonide-formoterol (SYMBICORT) 160-4.5 MCG/ACT inhaler Inhale 2 puffs into the lungs in the morning and at bedtime. 1 each 6   cholecalciferol (VITAMIN D3) 25 MCG (1000 UNIT) tablet Take 1,000 Units by mouth 2 (two) times daily.     clorazepate (TRANXENE) 3.75 MG tablet Take 1 tablet (3.75 mg total) by mouth 2 (two) times daily as needed for anxiety. 60 tablet 2   cyclobenzaprine (FLEXERIL) 5 MG tablet Take 1 tablet (5 mg total) by mouth at bedtime. 1 tablet 0   furosemide (LASIX) 20 MG  tablet TAKE 1 TABLET BY MOUTH EVERY DAY 90 tablet 1   lactulose (CHRONULAC) 10 GM/15ML solution Take 15 mLs (10 g total) by mouth 3 (three) times daily. 236 mL 11   metFORMIN (GLUCOPHAGE-XR) 500 MG 24 hr tablet TAKE 1 TABLET BY MOUTH EVERY DAY WITH BREAKFAST 90 tablet 1   Multiple Vitamin (MULTIVITAMIN) tablet Take 1 tablet by mouth daily.     Multiple Vitamins-Minerals (PRESERVISION AREDS 2) CAPS Take 1 capsule by mouth 2 (two) times daily.     mupirocin ointment (BACTROBAN) 2 % APPLY 1 APPLICATION TOPICALLY 2 (TWO) TIMES DAILY. TO AFFECTED AREAS 22 g 2   nadolol (CORGARD) 20 MG tablet TAKE 1 TABLET BY MOUTH EVERY DAY 90 tablet 3   nystatin (MYCOSTATIN) 100000 UNIT/ML suspension TAKE 5 MLS (500,000 UNITS TOTAL) BY MOUTH 3 (THREE) TIMES DAILY. SWISH AND SWALLOW 120 mL 0   pantoprazole (PROTONIX) 40 MG tablet TAKE 1 TABLET (40 MG TOTAL) BY MOUTH TWICE A DAY BEFORE MEALS 180 tablet 1   rifaximin (XIFAXAN) 550 MG TABS tablet Take 1 tablet (550 mg total) by mouth 2 (two) times daily. 60 tablet 3   SPIRIVA RESPIMAT 2.5 MCG/ACT AERS SMARTSIG:2 Puff(s) Via Inhaler Daily     SYSTANE COMPLETE 0.6 % SOLN Apply 1 drop to eye 2 (two) times daily.     traMADol (ULTRAM) 50 MG tablet Take 1 tablet (50 mg total) by mouth every 8 (eight) hours as needed for severe pain. 15 tablet 0   ursodiol (ACTIGALL) 300 MG capsule TAKE 1 CAPSULE BY MOUTH EVERY DAY 30 capsule 1    amoxicillin-clavulanate (AUGMENTIN) 875-125 MG tablet Take 1 tablet by mouth 2 (two) times daily. (Patient not taking: Reported on 02/23/2022) 14 tablet 0   No current facility-administered medications on file prior to visit.    Allergies  Allergen Reactions   Oxycodone Nausea And Vomiting   Crestor [Rosuvastatin]     Abdominal pain     Glipizide Other (See Comments)    Stomach pain   Prednisone     GI upset and general malaise        Physical Exam There were no vitals filed for this visit. Estimated body mass index is 19.74 kg/m as calculated from the following:   Height as of 01/15/22: _0  (1.626 m).   Weight as of this encounter: 115 lb (52.2 kg).  EKG (optional): deferred due to virtual visit  GENERAL: sound alert and oriented and no sounds of distress, vision exam deferred as virtual visit  PSYCH/NEURO: pleasant and cooperative, no obvious depression or anxiety, speech and thought processing grossly intact, Cognitive function grossly intact  [place] [facility] Visit from 02/23/2022 in [facility]  PHQ-9 Total Score 1           02/23/2022   12:53 PM 12/31/2021    2:49 PM 09/28/2021   12:21 PM 11/07/2020   10:40 AM 11/07/2019    2:05 PM  Depression screen PHQ 2/9  Decreased Interest 0 3 0 0 3  Down, Depressed, Hopeless 0 3 0 0 3  PHQ - 2 Score 0 6 0 0 6  Altered sleeping 0 1  0 0  Tired, decreased energy 0 0  0 0  Change in appetite 0 0  0 0  Feeling bad or failure about yourself  0 1  0 0  Trouble concentrating 1 1  0 0  Moving slowly or fidgety/restless 0 1  0 0  Suicidal thoughts 0 0  0 0  PHQ-9 Score 1 10  0 6  Difficult doing work/chores  Somewhat difficult  Not difficult at all Not difficult at all      09/28/2021   12:21 PM 12/28/2021    1:09 PM 12/31/2021    2:18 PM 01/04/2022    1:10 PM 02/23/2022   12:56 PM  Fall Risk  Falls in the past year? 0    0  Was there an injury with Fall?     0  Fall Risk Category Calculator      0  Fall Risk Category     Low  Patient Fall Risk Level  Low fall risk Low fall risk Low fall risk Low fall risk  Patient at Risk for Falls Due to     No Fall Risks  Fall risk Follow up Falls evaluation completed    Follow up appointment     SUMMARY AND PLAN:  Medicare annual wellness visit, subsequent  Tobacco use disorder    The following health maintenance/preventive care measures were recommended/discussed and the patient was referred if needed and if the patient agreeable:   Vaccines - reviewed, updated, advised   Mammogram - utd  Lung Cancer Screening - seeing pulmonology per her report  Colorectal cancer screening - utd  Osteoporosis screening if applicable scheduled for 06/17/22  Screening for glaucoma - she plans to do eye exam soon   Education and counseling on the following was provided based on the above review of health and a plan/checklist for the patient, along with additional information discussed, was provided for the patient in the patient instructions :  -Advised on importance of and resources for completing advanced directives - already completed per pt -Provided counseling and plan for difficulty hearing discussed. She plans to schedule audiology appt. -Provided counseling and plan for increased risk of falling if applicable per above screening.Fall screen negative but has some perceived worsening of balance. Discussed balance exercises she can do 2-3 days per week. Also advise 150 minutes per week of mod exercise and 2 days per week fo strength training. [facility] -Provided counseling and plan for function difficulties/ difficulties with ADLs if applicable per above screening. -Advised and counseled on maintaining healthy weight and healthy lifestyle - including the importance of a health diet, regular physical activity, social connections and stress management. -Advised and counseled on a whole foods based healthy diet:  discussed a heart healthy whole foods based diet at length. A summary of a healthy diet was provided in the Patient Instructions. -Advised yearly dental visits at minimum and regular eye exams -Advised and counseled on smoking at length. Assessed barriers. Advised setting quit date, discussed proper use of th nicotine patches and short acting nicotine products. Advised getting help via quit line before and after quit date as well as follow up with me or her doctor.   Follow up: see patient instructions     Patient Instructions  CHECKLIST FOR A HEALTHY LIFE:  Congratulations on wanting to quit smoking! It is one of the most important things you can do right now to improve your health dramatically! -CALL the quit line today -set a quit day -use the nicotine patches and lozenges: start on your quit day with the 21 mg patch for the first 6 weeks, then the 14 mg patch for 2 weeks, then the 59m patch for 2 weeks The 253mpatch will give you about the amount of nicotine in one pack  of cigarettes. -IF you still have cravings when on the patch: 1) drink a few sips of water slowly 2) do your balance exercises 3) use the nicotine lozenge if needed -call the quit line or schedule an appt with your doctor (or me) if you are struggling -do not give up - sometimes it can take multiple tries at quitting - that is ok, eventually you will succeed if you keep trying.   -Eat a healthy whole foods based diet, get regular physical activity, manage stress and engage in social connections - see below for specific suggestions and more information.  -Vaccines due:  -check with your doctor about the yearly covid vaccine and shingles vaccines, you can get theses vaccines at the pharmacy  -Tests and screenings due:  -with your doctors at follow up visits. Follow up with your primary doctor every 3-4 months.   -Referrals:  -none  -See a dentist at least yearly  -Get your eyes checked   -Other issues addressed  today: -healthy whole food plant heavy diet (see below for details) -add the balance exercises we discussed 3 days per week [facility], start very light and slow, gradually increase, continue with the walking!!!  -Follow up: -yearly for annual wellness visit with primary care office  -----------------------------------------------------------------------------------------------------------------------------------------------------------------------------------------------------------------------------------------------------------  [place]: -eat real food: lots of colorful vegetables (half the plate) -consume on a regular basis: whole grains (make sure first ingredient on label contains the word "whole"), fresh fruits, fish, nuts, seeds, healthy oils (such as olive oil, avocado oil ) -may eat small amounts of dairy and lean meat on occasion, but avoid processed meats such as ham, bacon, lunch meat, etc. -drink water -try to avoid fast food and pre-packaged foods, processed meat -try to avoid foods that contain any ingredients with names you do not recognize  -try to avoid sugar/sweets (except for the natural sugar that occurs in fresh fruit) -try to avoid sweet drinks -try to avoid white rice, white bread, pasta (unless whole grain), white or yellow potatoes  MOVE - the key to keeping your body moving and working best: -gradually increase intentional physical activity -move and stretch your body, legs, feet and arms when sitting for long periods -try to get at least 150 minutes per week, 20-30 minutes of sustained activity or two 10 minute episodes of sustained activity every day. If you need assistance with exercise you could consider the following community options: -balance exercises 3+ days per week -strength training 2 days per week   -Silver sneakers https://tools.silversneakers.com  -Walk with a  Doc: http://stephens-thompson.biz/  -try to include resistance (weight lifting/strength building) and balance exercises twice per week: or the following link for ideas: ChessContest.fr  UpdateClothing.com.cy  STRESS MANAGEMENT - so important for health and well being -try meditating, or just sitting quietly with deep breathing while intentionally relaxing all parts of your body for 5 minutes daily  SOCIAL CONNECTIONS: -options in [place] if you wish to engage in more social and exercise related activities:  -Silver sneakers https://tools.silversneakers.com  -Walk with a Doc: http://stephens-thompson.biz/  -Check out the [place] 50+ section on the [place] [employer] (hiking clubs, book clubs, cards and games, chess, exercise classes, aquatic classes and much more) - see the website for details: https://www.[place].gov/departments/parks-recreation/active-adults50  -YouTube has lots of exercise videos for different ages and abilities as well  -[place] (a variety of indoor and outdoor inperson  activities for adults). 9793822610. [address]  -Virtual Online Classes (a variety of topics): see seniorplanet.org or call [phone]  -consider volunteering at a school, hospice center, church, senior center or elsewhere         Lucretia Kern, DO

## 2022-02-24 ENCOUNTER — Inpatient Hospital Stay: Payer: Medicare HMO | Attending: Physician Assistant

## 2022-02-24 ENCOUNTER — Inpatient Hospital Stay: Payer: Medicare HMO | Admitting: Internal Medicine

## 2022-02-24 ENCOUNTER — Other Ambulatory Visit: Payer: Self-pay

## 2022-02-24 VITALS — BP 136/64 | HR 75 | Temp 97.6°F | Resp 15

## 2022-02-24 DIAGNOSIS — D509 Iron deficiency anemia, unspecified: Secondary | ICD-10-CM

## 2022-02-24 DIAGNOSIS — Z9221 Personal history of antineoplastic chemotherapy: Secondary | ICD-10-CM | POA: Diagnosis not present

## 2022-02-24 DIAGNOSIS — D500 Iron deficiency anemia secondary to blood loss (chronic): Secondary | ICD-10-CM | POA: Diagnosis not present

## 2022-02-24 DIAGNOSIS — Z923 Personal history of irradiation: Secondary | ICD-10-CM | POA: Insufficient documentation

## 2022-02-24 DIAGNOSIS — K922 Gastrointestinal hemorrhage, unspecified: Secondary | ICD-10-CM | POA: Diagnosis not present

## 2022-02-24 DIAGNOSIS — F17210 Nicotine dependence, cigarettes, uncomplicated: Secondary | ICD-10-CM | POA: Diagnosis not present

## 2022-02-24 DIAGNOSIS — C3490 Malignant neoplasm of unspecified part of unspecified bronchus or lung: Secondary | ICD-10-CM | POA: Diagnosis not present

## 2022-02-24 LAB — CBC WITH DIFFERENTIAL (CANCER CENTER ONLY)
Abs Immature Granulocytes: 0.1 10*3/uL — ABNORMAL HIGH (ref 0.00–0.07)
Basophils Absolute: 0.1 10*3/uL (ref 0.0–0.1)
Basophils Relative: 1 %
Eosinophils Absolute: 0 10*3/uL (ref 0.0–0.5)
Eosinophils Relative: 1 %
HCT: 40.1 % (ref 36.0–46.0)
Hemoglobin: 13.4 g/dL (ref 12.0–15.0)
Immature Granulocytes: 2 %
Lymphocytes Relative: 14 %
Lymphs Abs: 0.9 10*3/uL (ref 0.7–4.0)
MCH: 29.3 pg (ref 26.0–34.0)
MCHC: 33.4 g/dL (ref 30.0–36.0)
MCV: 87.6 fL (ref 80.0–100.0)
Monocytes Absolute: 0.6 10*3/uL (ref 0.1–1.0)
Monocytes Relative: 9 %
Neutro Abs: 4.8 10*3/uL (ref 1.7–7.7)
Neutrophils Relative %: 73 %
Platelet Count: 174 10*3/uL (ref 150–400)
RBC: 4.58 MIL/uL (ref 3.87–5.11)
RDW: 23.9 % — ABNORMAL HIGH (ref 11.5–15.5)
WBC Count: 6.5 10*3/uL (ref 4.0–10.5)
nRBC: 0 % (ref 0.0–0.2)

## 2022-02-24 LAB — FERRITIN: Ferritin: 28 ng/mL (ref 11–307)

## 2022-02-24 LAB — IRON AND IRON BINDING CAPACITY (CC-WL,HP ONLY)
Iron: 45 ug/dL (ref 28–170)
Saturation Ratios: 9 % — ABNORMAL LOW (ref 10.4–31.8)
TIBC: 519 ug/dL — ABNORMAL HIGH (ref 250–450)
UIBC: 474 ug/dL — ABNORMAL HIGH (ref 148–442)

## 2022-02-24 NOTE — Progress Notes (Signed)
[place] [phone]   [phone]  OFFICE PROGRESS NOTE  Tower, [name], MD [name] [address]  DIAGNOSIS:  1) Anemia secondary to GI occult blood loss.   2) suspicious for stage Ia (T1 a, N0, M0) lung cancer.  The patient declined bronchoscopy for tissue biopsy.  PRIOR THERAPY:  1) Oral iron supplements. Discontinued due to intolerance (severe abdominal pain) 2) status post SBRT to the lung lesion under the care of Dr. Donella Stade at [facility].  CURRENT THERAPY: IV iron infusions with Monoferric.   INTERVAL HISTORY: Christine Barton 72 y.o. female returns to the clinic today for follow-up visit.  The patient is feeling much better today after receiving iron infusion with Monoferric.  She denied having any current chest pain, shortness of breath but continues to have cough secondary to smoking.  She has no hemoptysis.  She has no nausea, vomiting, diarrhea or constipation.  She has no headache or visual changes.  She denied having any recent weight loss or night sweats.  She is here today for evaluation and repeat blood work.  MEDICAL HISTORY: Past Medical History:  Diagnosis Date   Alcohol abuse, unspecified    Breast cancer ([place]) 1998   Right   Cataract    left eye   Cervical spondylosis 2006   MRI   Degeneration of cervical intervertebral disc 2006   MRI   Diabetes mellitus without complication ([place])    Diverticulosis of colon (without mention of hemorrhage)    Hyperpotassemia    Lung cancer ([place]) 03/2021   started radiation in January 2023, right side   Microscopic hematuria    Mononeuritis of unspecified site    Nonspecific abnormal results of liver function study    Other abnormal glucose    Other and unspecified hyperlipidemia    no per pt   Other chronic nonalcoholic liver disease    Personal history of chemotherapy    Personal history of malignant neoplasm of breast    Personal  history of radiation therapy    Pneumothorax, acute    right, spontaneous   Tobacco use disorder    Unspecified vitamin D deficiency     ALLERGIES:  is allergic to oxycodone, crestor [rosuvastatin], glipizide, and prednisone.  MEDICATIONS:  Current Outpatient Medications  Medication Sig Dispense Refill   albuterol (VENTOLIN HFA) 108 (90 Base) MCG/ACT inhaler INHALE 2 PUFFS INTO THE LUNGS EVERY 4 HOURS AS NEEDED FOR WHEEZE OR FOR SHORTNESS OF BREATH 18 each 3   amoxicillin-clavulanate (AUGMENTIN) 875-125 MG tablet Take 1 tablet by mouth 2 (two) times daily. (Patient not taking: Reported on 02/23/2022) 14 tablet 0   budesonide-formoterol (SYMBICORT) 160-4.5 MCG/ACT inhaler Inhale 2 puffs into the lungs in the morning and at bedtime. 1 each 6   cholecalciferol (VITAMIN D3) 25 MCG (1000 UNIT) tablet Take 1,000 Units by mouth 2 (two) times daily.     clorazepate (TRANXENE) 3.75 MG tablet Take 1 tablet (3.75 mg total) by mouth 2 (two) times daily as needed for anxiety. 60 tablet 2   cyclobenzaprine (FLEXERIL) 5 MG tablet Take 1 tablet (5 mg total) by mouth at bedtime. 1 tablet 0   furosemide (LASIX) 20 MG tablet TAKE 1 TABLET BY MOUTH EVERY DAY 90 tablet 1   lactulose (CHRONULAC) 10 GM/15ML solution Take 15 mLs (10 g total) by mouth 3 (three) times daily. 236 mL 11   metFORMIN (GLUCOPHAGE-XR) 500 MG 24 hr  tablet TAKE 1 TABLET BY MOUTH EVERY DAY WITH BREAKFAST 90 tablet 1   Multiple Vitamin (MULTIVITAMIN) tablet Take 1 tablet by mouth daily.     Multiple Vitamins-Minerals (PRESERVISION AREDS 2) CAPS Take 1 capsule by mouth 2 (two) times daily.     mupirocin ointment (BACTROBAN) 2 % APPLY 1 APPLICATION TOPICALLY 2 (TWO) TIMES DAILY. TO AFFECTED AREAS 22 g 2   nadolol (CORGARD) 20 MG tablet TAKE 1 TABLET BY MOUTH EVERY DAY 90 tablet 3   nystatin (MYCOSTATIN) 100000 UNIT/ML suspension TAKE 5 MLS (500,000 UNITS TOTAL) BY MOUTH 3 (THREE) TIMES DAILY. SWISH AND SWALLOW 120 mL 0   pantoprazole (PROTONIX)  40 MG tablet TAKE 1 TABLET (40 MG TOTAL) BY MOUTH TWICE A DAY BEFORE MEALS 180 tablet 1   rifaximin (XIFAXAN) 550 MG TABS tablet Take 1 tablet (550 mg total) by mouth 2 (two) times daily. 60 tablet 3   SPIRIVA RESPIMAT 2.5 MCG/ACT AERS SMARTSIG:2 Puff(s) Via Inhaler Daily     SYSTANE COMPLETE 0.6 % SOLN Apply 1 drop to eye 2 (two) times daily.     traMADol (ULTRAM) 50 MG tablet Take 1 tablet (50 mg total) by mouth every 8 (eight) hours as needed for severe pain. 15 tablet 0   ursodiol (ACTIGALL) 300 MG capsule TAKE 1 CAPSULE BY MOUTH EVERY DAY 30 capsule 1   No current facility-administered medications for this visit.    SURGICAL HISTORY:  Past Surgical History:  Procedure Laterality Date   BIOPSY  07/27/2019   Procedure: BIOPSY;  Surgeon: Yetta Flock, MD;  Location: [facility];  Service: Gastroenterology;;   BREAST BIOPSY  9/03   Right   BREAST LUMPECTOMY Right 1998   CHEST TUBE INSERTION  11/02/2014   COLONOSCOPY     COLONOSCOPY WITH PROPOFOL N/A 07/27/2019   Procedure: COLONOSCOPY WITH PROPOFOL;  Surgeon: Yetta Flock, MD;  Location: [facility];  Service: Gastroenterology;  Laterality: N/A;   ESOPHAGOGASTRODUODENOSCOPY (EGD) WITH PROPOFOL N/A 10/30/2018   Procedure: ESOPHAGOGASTRODUODENOSCOPY (EGD) WITH PROPOFOL;  Surgeon: Mauri Pole, MD;  Location: [facility];  Service: Endoscopy;  Laterality: N/A;   ESOPHAGOGASTRODUODENOSCOPY (EGD) WITH PROPOFOL N/A 03/12/2019   Procedure: ESOPHAGOGASTRODUODENOSCOPY (EGD) WITH PROPOFOL;  Surgeon: Mauri Pole, MD;  Location: [facility];  Service: Endoscopy;  Laterality: N/A;   ESOPHAGOGASTRODUODENOSCOPY (EGD) WITH PROPOFOL N/A 07/27/2019   Procedure: ESOPHAGOGASTRODUODENOSCOPY (EGD) WITH PROPOFOL;  Surgeon: Yetta Flock, MD;  Location: [facility];  Service: Gastroenterology;  Laterality: N/A;   EYE SURGERY  02/2017   cataract extraction with lens implant-left   HOT HEMOSTASIS N/A 07/27/2019   Procedure:  HOT HEMOSTASIS (ARGON PLASMA COAGULATION/BICAP);  Surgeon: Yetta Flock, MD;  Location: [facility];  Service: Gastroenterology;  Laterality: N/A;   MOUTH SURGERY     POLYPECTOMY  07/27/2019   Procedure: POLYPECTOMY;  Surgeon: Yetta Flock, MD;  Location: [facility];  Service: Gastroenterology;;   TUBAL LIGATION      REVIEW OF SYSTEMS:  A comprehensive review of systems was negative except for: Respiratory: positive for cough   PHYSICAL EXAMINATION: General appearance: alert, cooperative, and no distress Head: Normocephalic, without obvious abnormality, atraumatic Neck: no adenopathy, no JVD, supple, symmetrical, trachea midline, and thyroid not enlarged, symmetric, no tenderness/mass/nodules Lymph nodes: Cervical, supraclavicular, and axillary nodes normal. Resp: clear to auscultation bilaterally Back: symmetric, no curvature. ROM normal. No CVA tenderness. Cardio: regular rate and rhythm, S1, S2 normal, no murmur, click, rub or gallop GI: soft, non-tender; bowel sounds normal; no masses,  no organomegaly Extremities: extremities normal, atraumatic, no cyanosis or edema  ECOG PERFORMANCE STATUS: 1 - Symptomatic but completely ambulatory  Blood pressure 136/64, pulse 75, temperature 97.6 F (36.4 C), temperature source Oral, resp. rate 15, SpO2 99 %.  LABORATORY DATA: Lab Results  Component Value Date   WBC 6.5 02/24/2022   HGB 13.4 02/24/2022   HCT 40.1 02/24/2022   MCV 87.6 02/24/2022   PLT 174 02/24/2022      Chemistry      Component Value Date/Time   NA 134 (L) 12/21/2021 1026   NA 132 (L) 05/26/2021 1147   K 4.5 12/21/2021 1026   CL 99 12/21/2021 1026   CO2 29 12/21/2021 1026   BUN 12 12/21/2021 1026   BUN 21 05/26/2021 1147   CREATININE 0.78 12/21/2021 1026   CREATININE 0.69 11/20/2021 1512      Component Value Date/Time   CALCIUM 9.4 12/21/2021 1026   ALKPHOS 78 12/21/2021 1026   AST 14 12/21/2021 1026   AST 17 11/20/2021 1512   ALT 5  12/21/2021 1026   ALT 10 11/20/2021 1512   BILITOT 0.4 12/21/2021 1026   BILITOT 0.6 11/20/2021 1512       RADIOGRAPHIC STUDIES: No results found.  ASSESSMENT AND PLAN: This is a very pleasant 71 years old white female with iron deficiency anemia secondary to likely chronic blood loss secondary to AV malformation as well as lack of dietary iron supplements.  She has intolerance to the oral iron tablets. The patient has been treated with IV iron infusion with Venofer 300 mg IV weekly for 3 weeks and tolerated it fairly well. She was recently treated with Monoferric at [facility] center because of logistical issues. The patient is feeling much better and she has significant improvement in her hemoglobin and hematocrit after the iron infusion. Regarding the stage Ia non-small cell lung cancer, she is currently on observation. I will see the patient back for follow-up visit in 3 months for evaluation with repeat blood work as well as CT scan of the chest for restaging of her disease. The patient was advised to call immediately if she has any other concerning symptoms in the interval. The patient voices understanding of current disease status and treatment options and is in agreement with the current care plan. All questions were answered. The patient knows to call the clinic with any problems, questions or concerns. We can certainly see the patient much sooner if necessary.   Disclaimer: This note was dictated with voice recognition software. Similar sounding words can inadvertently be transcribed and may not be corrected upon review.

## 2022-02-25 ENCOUNTER — Ambulatory Visit (INDEPENDENT_AMBULATORY_CARE_PROVIDER_SITE_OTHER): Payer: Medicare HMO | Admitting: Adult Health

## 2022-02-25 ENCOUNTER — Encounter: Payer: Self-pay | Admitting: Adult Health

## 2022-02-25 DIAGNOSIS — G4701 Insomnia due to medical condition: Secondary | ICD-10-CM

## 2022-02-25 DIAGNOSIS — F411 Generalized anxiety disorder: Secondary | ICD-10-CM | POA: Diagnosis not present

## 2022-02-25 MED ORDER — CLORAZEPATE DIPOTASSIUM 3.75 MG PO TABS: 3.7500 mg | ORAL_TABLET | Freq: Two times a day (BID) | ORAL | 2 refills | Status: DC | PRN

## 2022-02-25 NOTE — Progress Notes (Signed)
Christine Barton [id number] 01-17-1951 71 y.o.  Subjective:   Patient ID:  Christine Barton is a 71 y.o. (DOB 1950-04-23) female.  Chief Complaint: No chief complaint on file.   HPI Christine Barton presents to the office today for follow-up of GAD and insomnia.   Describes mood today as "ok". Pleasant. Mood symptoms - reports anxiety, depression and irritability. Reports increased panic attacks. Stating "I'm not doing too good". Reports increased situational stressors. Feels like Tranxene continues to be helpful  Would like to work with a Social worker. Diagnosed with lung cancer. Stable interest and motivation. Taking medications as prescribed.  Energy levels lower. Active, does not have a regular exercise routine.  Enjoys some usual interests and activities. Lives alone with 7 cats. Lives next to daughter. Spending time with family.  Appetite decreased - pushing herself to eat - restarted ensure. Weight stable - 114 pounds. Sleeps well most nights. Averages 7 to 8 hours. Focus and concentration difficulties. Completing tasks. Managing aspects of household. Retired from [employer]. Denies SI or HI.  Denies AH or VH.  Mini-Mental    Flowsheet Row Clinical Support from 11/06/2018 in [place] at [facility] from 10/31/2017 in [place] at [facility]  Total Score (max 30 points ) 17 20      PHQ2-9    [place] Visit from 02/23/2022 in [place] at [facility] from 12/31/2021 in [facility] Coordination Office Visit from 09/28/2021 in [place] at [facility] from 11/07/2020 in [place] [facility] from 11/07/2019 in [place] at [facility]  PHQ-2 Total Score 0 6 0 0 6  PHQ-9 Total Score 1 10 -- 0 6      [place] from 07/27/2021 in [place] No Risk         Review of Systems:  Review of Systems  Musculoskeletal:  Negative for gait problem.  Neurological:  Negative for tremors.  Psychiatric/Behavioral:         Please refer to HPI    Medications: I have reviewed the patient's current medications.  Current Outpatient Medications  Medication Sig Dispense Refill   albuterol (VENTOLIN HFA) 108 (90 Base) MCG/ACT inhaler INHALE 2 PUFFS INTO THE LUNGS EVERY 4 HOURS AS NEEDED FOR WHEEZE OR FOR SHORTNESS OF BREATH 18 each 3   amoxicillin-clavulanate (AUGMENTIN) 875-125 MG tablet Take 1 tablet by mouth 2 (two) times daily. (Patient not taking: Reported on 02/23/2022) 14 tablet 0   budesonide-formoterol (SYMBICORT) 160-4.5 MCG/ACT inhaler Inhale 2 puffs into the lungs in the morning and at bedtime. 1 each 6   cholecalciferol (VITAMIN D3) 25 MCG (1000 UNIT) tablet Take 1,000 Units by mouth 2 (two) times daily.     clorazepate (TRANXENE) 3.75 MG tablet Take 1 tablet (3.75 mg total) by mouth 2 (two) times daily as needed for anxiety. 60 tablet 2   cyclobenzaprine (FLEXERIL) 5 MG tablet Take 1 tablet (5 mg total) by mouth at bedtime. 1 tablet 0   furosemide (LASIX) 20 MG tablet TAKE 1 TABLET BY MOUTH EVERY DAY 90 tablet 1   lactulose (CHRONULAC) 10 GM/15ML solution Take 15 mLs (10 g total) by mouth 3 (three) times daily. 236 mL 11   metFORMIN (GLUCOPHAGE-XR) 500 MG 24 hr tablet TAKE 1 TABLET BY MOUTH EVERY DAY WITH BREAKFAST 90 tablet 1   Multiple Vitamin (MULTIVITAMIN) tablet Take 1 tablet by  mouth daily.     Multiple Vitamins-Minerals (PRESERVISION AREDS 2) CAPS Take 1 capsule by mouth 2 (two) times daily.     mupirocin ointment (BACTROBAN) 2 % APPLY 1 APPLICATION TOPICALLY 2 (TWO) TIMES DAILY. TO AFFECTED AREAS 22 g 2   nadolol (CORGARD) 20 MG tablet TAKE 1 TABLET BY MOUTH EVERY DAY 90 tablet 3   nystatin (MYCOSTATIN) 100000 UNIT/ML suspension TAKE 5 MLS (500,000 UNITS TOTAL) BY MOUTH 3 (THREE) TIMES DAILY. SWISH AND SWALLOW 120 mL 0   pantoprazole  (PROTONIX) 40 MG tablet TAKE 1 TABLET (40 MG TOTAL) BY MOUTH TWICE A DAY BEFORE MEALS 180 tablet 1   rifaximin (XIFAXAN) 550 MG TABS tablet Take 1 tablet (550 mg total) by mouth 2 (two) times daily. 60 tablet 3   SPIRIVA RESPIMAT 2.5 MCG/ACT AERS SMARTSIG:2 Puff(s) Via Inhaler Daily     SYSTANE COMPLETE 0.6 % SOLN Apply 1 drop to eye 2 (two) times daily.     traMADol (ULTRAM) 50 MG tablet Take 1 tablet (50 mg total) by mouth every 8 (eight) hours as needed for severe pain. 15 tablet 0   ursodiol (ACTIGALL) 300 MG capsule TAKE 1 CAPSULE BY MOUTH EVERY DAY 30 capsule 1   No current facility-administered medications for this visit.    Medication Side Effects: None  Allergies:  Allergies  Allergen Reactions   Oxycodone Nausea And Vomiting   Crestor [Rosuvastatin]     Abdominal pain     Glipizide Other (See Comments)    Stomach pain   Prednisone     GI upset and general malaise     Past Medical History:  Diagnosis Date   Alcohol abuse, unspecified    Breast cancer ([place]) 1998   Right   Cataract    left eye   Cervical spondylosis 2006   MRI   Degeneration of cervical intervertebral disc 2006   MRI   Diabetes mellitus without complication ([place])    Diverticulosis of colon (without mention of hemorrhage)    Hyperpotassemia    Lung cancer ([place]) 03/2021   started radiation in January 2023, right side   Microscopic hematuria    Mononeuritis of unspecified site    Nonspecific abnormal results of liver function study    Other abnormal glucose    Other and unspecified hyperlipidemia    no per pt   Other chronic nonalcoholic liver disease    Personal history of chemotherapy    Personal history of malignant neoplasm of breast    Personal history of radiation therapy    Pneumothorax, acute    right, spontaneous   Tobacco use disorder    Unspecified vitamin D deficiency     Past Medical History, Surgical history, Social history, and Family history were reviewed and updated as  appropriate.   Please see review of systems for further details on the patient's review from today.   Objective:   Physical Exam:  LMP  (LMP Unknown)   Physical Exam Constitutional:      General: She is not in acute distress. Musculoskeletal:        General: No deformity.  Neurological:     Mental Status: She is alert and oriented to person, place, and time.     Coordination: Coordination normal.  Psychiatric:        Attention and Perception: Attention and perception normal. She does not perceive auditory or visual hallucinations.        Mood and Affect: Mood normal. Mood is not anxious or depressed.  Affect is not labile, blunt, angry or inappropriate.        Speech: Speech normal.        Behavior: Behavior normal.        Thought Content: Thought content normal. Thought content is not paranoid or delusional. Thought content does not include homicidal or suicidal ideation. Thought content does not include homicidal or suicidal plan.        Cognition and Memory: Cognition and memory normal.        Judgment: Judgment normal.     Comments: Insight intact     Lab Review:     Component Value Date/Time   NA 134 (L) 12/21/2021 1026   NA 132 (L) 05/26/2021 1147   K 4.5 12/21/2021 1026   CL 99 12/21/2021 1026   CO2 29 12/21/2021 1026   GLUCOSE 109 (H) 12/21/2021 1026   BUN 12 12/21/2021 1026   BUN 21 05/26/2021 1147   CREATININE 0.78 12/21/2021 1026   CREATININE 0.69 11/20/2021 1512   CALCIUM 9.4 12/21/2021 1026   PROT 6.4 12/21/2021 1026   PROT 7.1 05/26/2021 1147   ALBUMIN 3.8 12/21/2021 1026   ALBUMIN 4.4 05/26/2021 1147   AST 14 12/21/2021 1026   AST 17 11/20/2021 1512   ALT 5 12/21/2021 1026   ALT 10 11/20/2021 1512   ALKPHOS 78 12/21/2021 1026   BILITOT 0.4 12/21/2021 1026   BILITOT 0.6 11/20/2021 1512   GFRNONAA >60 11/20/2021 1512   GFRAA 83 12/11/2019 1617       Component Value Date/Time   WBC 6.5 02/24/2022 0859   WBC 6.2 12/21/2021 1026   RBC 4.58  02/24/2022 0859   HGB 13.4 02/24/2022 0859   HGB 12.4 12/11/2019 1617   HCT 40.1 02/24/2022 0859   HCT 39.3 12/11/2019 1617   PLT 174 02/24/2022 0859   PLT 120 (L) 12/11/2019 1617   MCV 87.6 02/24/2022 0859   MCV 86 12/11/2019 1617   MCH 29.3 02/24/2022 0859   MCHC 33.4 02/24/2022 0859   RDW 23.9 (H) 02/24/2022 0859   RDW 17.0 (H) 12/11/2019 1617   LYMPHSABS 0.9 02/24/2022 0859   MONOABS 0.6 02/24/2022 0859   EOSABS 0.0 02/24/2022 0859   BASOSABS 0.1 02/24/2022 0859    No results found for: "POCLITH", "LITHIUM"   No results found for: "PHENYTOIN", "PHENOBARB", "VALPROATE", "CBMZ"   .res Assessment: Plan:    Plan:  PDMP reviewed  1. Tranxene 3.75 BID prn anxiety  RTC 6 months   Time spent with patient was 25 minutes. Greater than 50% of face to face time with patient was spent on counseling and coordination of care.    Patient advised to contact office with any questions, adverse effects, or acute worsening in signs and symptoms.  Discussed potential benefits, risk, and side effects of benzodiazepines to include potential risk of tolerance and dependence, as well as possible drowsiness. Advised patient not to drive if experiencing drowsiness and to take lowest possible effective dose to minimize risk of dependence and tolerance.    Diagnoses and all orders for this visit:  Generalized anxiety disorder -     clorazepate (TRANXENE) 3.75 MG tablet; Take 1 tablet (3.75 mg total) by mouth 2 (two) times daily as needed for anxiety.  Insomnia due to medical condition -     clorazepate (TRANXENE) 3.75 MG tablet; Take 1 tablet (3.75 mg total) by mouth 2 (two) times daily as needed for anxiety.     Please see After Visit Summary for patient  specific instructions.  Future Appointments  Date Time Provider [place]  03/23/2022  2:00 PM Donato Heinz, MD [facility] None  06/17/2022  1:00 PM [facility] DX DEXA 1 [facility]  07/15/2022  3:00 PM [facility]  [facility] PHARMACIST [facility]    No orders of the defined types were placed in this encounter.   -------------------------------

## 2022-03-01 ENCOUNTER — Other Ambulatory Visit: Payer: Self-pay | Admitting: Family Medicine

## 2022-03-01 ENCOUNTER — Other Ambulatory Visit (HOSPITAL_COMMUNITY): Payer: Self-pay

## 2022-03-01 ENCOUNTER — Telehealth: Payer: Self-pay | Admitting: *Deleted

## 2022-03-01 ENCOUNTER — Encounter: Payer: Self-pay | Admitting: Internal Medicine

## 2022-03-01 NOTE — Telephone Encounter (Signed)
Patient called and states she is switching insurance providers and would like to see if she can switch from [place] to a generic.  Uses [facility] in [place]. Please call and clarify with patient.

## 2022-03-01 NOTE — Telephone Encounter (Signed)
Patient states that this is for her current insurance.  Please advise

## 2022-03-01 NOTE — Telephone Encounter (Signed)
Spoke to patient to relayed below message. She stated that she would like to contact insurance and ensure that spiriva handihaler is covered.  She will call back with update. Nothing further needed.

## 2022-03-01 NOTE — Telephone Encounter (Signed)
Per current benefits investigation it seems that the Brand Spiriva Handihaler is covered at no cost, there is a generic for that one but it is not covered.

## 2022-03-01 NOTE — Telephone Encounter (Signed)
We can switch her to the HandiHaler formulation of Spiriva this will be 1 capsule inhaled daily.

## 2022-03-01 NOTE — Telephone Encounter (Signed)
Called and spoke to patient and she states that she is switching insurance and they are asking for generic sprivia respimat. But I know there is no generic version of this medication  Can we run a ticket to see what is covered. She states the pharmacy recommended two medications but she was unsure of the name  Please advise

## 2022-03-01 NOTE — Telephone Encounter (Signed)
Called patient and she states that she is currently on the [place] but her insurance is not covering it. She called and wanted the generic but I told her that there was no generic option for that inhaler. And test claims from PA team state that the Spirivia Handihaler is covered.   But what would you like to do Dr Patsey Berthold in regards to inhalers.  Please advise

## 2022-03-01 NOTE — Telephone Encounter (Signed)
Is this for the new insurance for 2024? We would not be able to do test claims for a plan that is not active. Please advise, thank you!

## 2022-03-12 ENCOUNTER — Ambulatory Visit: Payer: Medicare HMO | Admitting: Pulmonary Disease

## 2022-03-12 ENCOUNTER — Encounter: Payer: Self-pay | Admitting: Pulmonary Disease

## 2022-03-12 VITALS — BP 118/76 | HR 66 | Temp 97.5°F | Ht 64.0 in | Wt 113.2 lb

## 2022-03-12 DIAGNOSIS — C3411 Malignant neoplasm of upper lobe, right bronchus or lung: Secondary | ICD-10-CM | POA: Diagnosis not present

## 2022-03-12 DIAGNOSIS — K7030 Alcoholic cirrhosis of liver without ascites: Secondary | ICD-10-CM

## 2022-03-12 DIAGNOSIS — J432 Centrilobular emphysema: Secondary | ICD-10-CM

## 2022-03-12 NOTE — Patient Instructions (Signed)
Under your new insurance the medication that will be covered is ANORO, this would be 1 puff once a day.  Please let us know when your new plan goes in effect and then we will send the prescription in to your pharmacy of choice.  I do recommend that you get an RSV VACCINE you [facility].  We will see you in follow-up in 4 months time call sooner should any new problems arise.

## 2022-03-12 NOTE — Progress Notes (Signed)
Subjective:    Patient ID: Christine Barton, female    DOB: 02/23/1951, 71 y.o.   MRN: [id number] Patient Care Team: Judy Pimple, MD as PCP - General Little Ishikawa, MD as PCP - Cardiology (Cardiology) Kathyrn Sheriff, [facility] as Pharmacist (Pharmacist)  Chief Complaint  Patient presents with   Follow-up    No SOB. Wheezing if she does not use her inhalers.    HPI The patient is a 71 year old current smoker (1 PPD) who presents for follow-up of COPD by clinical impression.  Call that she was initially evaluated for and 8 mm right upper lobe nodule found incidentally on low-dose CT chest during lung cancer screening. The patient had had this abnormality followed previously imaged in October 2021 at that time being only 3.7 mm.  The nodule is on the same area of prior radiation changes breast cancer noted in the right lung. PET/CT performed on 11 February 2021 showed FDG avidity in this nodule.  There was no evidence of other abnormal uptake in the chest with the exception of the aforementioned area of scarring.  The patient opted to forego invasive procedures and go directly to SBRT due to her multiple comorbidities.  She completed SBRT to this area.  She tolerated this well.  He has been monitored closely for potential recurrence.  She had a CT chest performed 03 August 2021 showing residua of radiation but no evidence of recurrence.  A follow-up CT in 3 months is recommended as active infection or inflammation cannot be excluded.  She will be due for follow-up on this in August.    At her visit in April 2023 she was still using Symbicort however had difficulties with continue recurrent thrush.  And was switched to Spiriva 2.5 mcg 2 puffs daily.  She has difficulty with inhalers containing ICS due to recurrent thrush.  Because of recurrence of thrush with ICS despite rinsing etc., the patient is being maintained on Spiriva which she states helps her shortness of breath and wheezing.   However she is having a change in insurance coverage and the only medication that will be covered will be Anoro.  She has not had any fevers, chills or sweats.  No chest pain.  Weight is stable.  She has had no anorexia.  No lower extremity edema nor calf tenderness.  Does not endorse any other symptomatology.    Review of Systems A 10 point review of systems was performed and it is as noted above otherwise negative.  Patient Active Problem List   Diagnosis Date Noted   Cat bite of upper arm, left, initial encounter 12/23/2021   Pulmonary emphysema (HCC) 09/03/2021   Shortness of breath 09/03/2021   Agatston CAC score, >400 07/02/2021   Left-sided chest wall pain 06/04/2021   Lung cancer (HCC) 02/25/2021   Iron deficiency anemia 01/01/2021   Iron deficiency anemia due to chronic blood loss 11/06/2020   AVM (arteriovenous malformation) of colon    Benign neoplasm of colon    NASH (nonalcoholic steatohepatitis)    Malnutrition (HCC) 07/25/2019   HSV infection 02/23/2019   Aortic atherosclerosis (HCC) 01/22/2019   Coronary atherosclerosis 01/22/2019   Encounter for screening for lung cancer 01/04/2019   Venous insufficiency 11/15/2018   Esophageal varices without bleeding (HCC) 10/27/2018   Anemia 10/23/2018   Marasmus (HCC) 07/26/2018   Poor balance 05/30/2018   Cirrhosis of liver (HCC) 04/24/2018   Gallstones 04/24/2018   Heme positive stool 04/24/2018   Screening mammogram,  encounter for 11/07/2017   Smoker 11/07/2017   Screening examination for STD (sexually transmitted disease) 11/11/2016   Welcome to Medicare preventive visit 10/26/2016   Estrogen deficiency 10/26/2016   Colon cancer screening 11/01/2014   Encounter for routine gynecological examination 09/12/2013   Left shoulder pain 08/20/2011   Routine general medical examination at a health care facility 07/21/2011   Vitamin D deficiency 08/04/2009   POSTMENOPAUSAL STATUS 08/04/2009   Controlled diabetes mellitus type  2 with complications (HCC) 02/18/2009   Hyperlipidemia associated with type 2 diabetes mellitus (HCC) 06/14/2008   DISC DISEASE, CERVICAL 03/30/2007   History of alcohol abuse 12/06/2006   Neuropathy of both feet 12/06/2006   DIVERTICULOSIS, COLON 12/06/2006   Fatty liver 12/06/2006   BREAST CANCER, HX OF 12/06/2006   Social History   Tobacco Use   Smoking status: Every Day    Packs/day: 1.00    Years: 58.00    Total pack years: 58.00    Types: Cigarettes    Start date: 09/25/2021    Passive exposure: Past   Smokeless tobacco: Never   Tobacco comments:        1PPD - 03/12/2022  Substance Use Topics   Alcohol use: Not Currently   Allergies  Allergen Reactions   Oxycodone Nausea And Vomiting   Crestor [Rosuvastatin]     Abdominal pain     Glipizide Other (See Comments)    Stomach pain   Prednisone     GI upset and general malaise    Current Meds  Medication Sig   albuterol (VENTOLIN HFA) 108 (90 Base) MCG/ACT inhaler INHALE 2 PUFFS INTO THE LUNGS EVERY 4 HOURS AS NEEDED FOR WHEEZE OR FOR SHORTNESS OF BREATH   cholecalciferol (VITAMIN D3) 25 MCG (1000 UNIT) tablet Take 1,000 Units by mouth 2 (two) times daily.   clorazepate (TRANXENE) 3.75 MG tablet Take 1 tablet (3.75 mg total) by mouth 2 (two) times daily as needed for anxiety.   cyclobenzaprine (FLEXERIL) 5 MG tablet Take 1 tablet (5 mg total) by mouth at bedtime.   furosemide (LASIX) 20 MG tablet TAKE 1 TABLET BY MOUTH EVERY DAY   lactulose (CHRONULAC) 10 GM/15ML solution Take 15 mLs (10 g total) by mouth 3 (three) times daily.   metFORMIN (GLUCOPHAGE-XR) 500 MG 24 hr tablet TAKE 1 TABLET BY MOUTH EVERY DAY WITH BREAKFAST   Multiple Vitamin (MULTIVITAMIN) tablet Take 1 tablet by mouth daily.   Multiple Vitamins-Minerals (PRESERVISION AREDS 2) CAPS Take 1 capsule by mouth 2 (two) times daily.   mupirocin ointment (BACTROBAN) 2 % APPLY 1 APPLICATION TOPICALLY 2 (TWO) TIMES DAILY. TO AFFECTED AREAS   nadolol (CORGARD) 20  MG tablet TAKE 1 TABLET BY MOUTH EVERY DAY   nystatin (MYCOSTATIN) 100000 UNIT/ML suspension TAKE 5 MLS (500,000 UNITS TOTAL) BY MOUTH 3 (THREE) TIMES DAILY. SWISH AND SWALLOW   pantoprazole (PROTONIX) 40 MG tablet TAKE 1 TABLET (40 MG TOTAL) BY MOUTH TWICE A DAY BEFORE MEALS   rifaximin (XIFAXAN) 550 MG TABS tablet Take 1 tablet (550 mg total) by mouth 2 (two) times daily.   SPIRIVA RESPIMAT 2.5 MCG/ACT AERS 1 puff twice a day   SYSTANE COMPLETE 0.6 % SOLN Apply 1 drop to eye 2 (two) times daily.   traMADol (ULTRAM) 50 MG tablet Take 1 tablet (50 mg total) by mouth every 8 (eight) hours as needed for severe pain.   ursodiol (ACTIGALL) 300 MG capsule TAKE 1 CAPSULE BY MOUTH EVERY DAY   Immunization History  Administered Date(s)  Administered   Fluad Quad(high Dose 65+) 12/04/2018, 01/19/2020, 12/23/2021   Hep A / Hep B 09/05/2018, 10/10/2018, 03/29/2019   Influenza Split 03/10/2011   Influenza Whole 02/11/2000, 02/06/2016   Influenza, High Dose Seasonal PF 02/03/2021   Influenza,inj,Quad PF,6+ Mos 02/16/2017, 04/18/2018   Influenza-Unspecified 02/11/2016   PFIZER(Purple Top)SARS-COV-2 Vaccination 05/21/2019, 06/16/2019, 02/13/2020   Pfizer Covid-19 Vaccine Bivalent Booster 65yrs & up 02/10/2021   Pneumococcal Conjugate-13 10/26/2016   Pneumococcal Polysaccharide-23 06/28/2008, 11/07/2017   Td 05/21/2002   Tdap 09/12/2013   Zoster, Live 08/24/2011        Objective:   Physical Exam BP 118/76 (BP Location: Left Arm, Cuff Size: Normal)   Pulse 66   Temp (!) 97.5 F (36.4 C)   Ht 5\' 4"  (1.626 m)   Wt 113 lb 3.2 oz (51.3 kg)   LMP  (LMP Unknown)   SpO2 98%   BMI 19.43 kg/m   GENERAL: Thin, frail woman, no acute distress, fully ambulatory, no conversational dyspnea. HEAD: Normocephalic, atraumatic.  EYES: Pupils equal, round, reactive to light.  No scleral icterus.  MOUTH: Dentures uppers and lowers, no evidence of thrush. NECK: Supple. No thyromegaly. Trachea midline. No JVD.   No adenopathy. PULMONARY: Good air entry bilaterally.  Coarse breath sounds, diffuse end expiratory wheezes. CARDIOVASCULAR: S1 and S2. Regular rate and rhythm.  ABDOMEN: Scaphoid otherwise benign. MUSCULOSKELETAL: No joint deformity, no clubbing, no edema.  NEUROLOGIC: Grossly nonfocal.  Speech fluent. SKIN: Intact,warm,dry. PSYCH: Mood and behavior normal.      Assessment & Plan:     ICD-10-CM   1. Centrilobular emphysema (HCC)  J43.2    Will switch to Anoro due to insurance coverage change Patient was taught how to use the inhaler Continue albuterol as needed    2. Malignant neoplasm of upper lobe of right lung (HCC)  C34.11    Treated with SBRT No evidence of recurrence    3. Alcoholic cirrhosis of liver without ascites (HCC)  K70.30    Follows with GI This issue adds complexity to her management     Will have a change on insurance plans coming up soon.  The new medication to be covered would be Anoro.  She is to let us know when her insurance plan comes into effect so we can send the prescription into her pharmacy.  Will see her in follow-up in 4 months time she is to contact us prior to that time should any new difficulties arise.  Gailen Shelter, MD Advanced Bronchoscopy PCCM [facility]    *This note was dictated using voice recognition software/Dragon.  Despite best efforts to proofread, errors can occur which can change the meaning. Any transcriptional errors that result from this process are unintentional and may not be fully corrected at the time of dictation.

## 2022-03-15 ENCOUNTER — Other Ambulatory Visit: Payer: Self-pay | Admitting: Family Medicine

## 2022-03-15 ENCOUNTER — Other Ambulatory Visit: Payer: Self-pay | Admitting: Gastroenterology

## 2022-03-16 NOTE — Telephone Encounter (Signed)
Med was d/c on 06/30/21 by GI, but then added back on 08/04/21 by PCP as a historical entry. last time PCP filled med was 03/12/21 #15 tabs with 3 refills

## 2022-03-22 ENCOUNTER — Telehealth: Payer: Self-pay

## 2022-03-22 NOTE — Telephone Encounter (Signed)
248 pm.  Phone call made to patient to advise of restructuring of the Palliative Care Program and to offer a home visit from RN/SW team.  Patient advises she is under a tremendous amount of stress right now and is working to address property issues.  She would like to remain under the [facility] program but has requested a call and follow up visit for January.  Advised patient follow up call would be made next month at her request.

## 2022-03-23 ENCOUNTER — Ambulatory Visit: Payer: Medicare HMO | Admitting: Cardiology

## 2022-04-11 ENCOUNTER — Other Ambulatory Visit: Payer: Self-pay | Admitting: Family Medicine

## 2022-04-15 ENCOUNTER — Encounter: Payer: Self-pay | Admitting: Internal Medicine

## 2022-04-15 NOTE — Progress Notes (Signed)
  Care Coordination Note  04/15/2022 Name: Christine Barton MRN: [id number] DOB: 1950/07/28  Christine Barton is a 72 y.o. year old female who is a primary care patient of Tower, [name], MD and is actively engaged with the care management team. I reached out to [name] by phone today to assist with re-scheduling a follow up visit with the RN Case Manager  Follow up plan: Telephone appointment with care management team member scheduled for:04/19/2022  Julian Hy, [place] Direct Dial: [phone]

## 2022-04-19 ENCOUNTER — Ambulatory Visit: Payer: Self-pay

## 2022-04-19 NOTE — Patient Outreach (Signed)
  Care Coordination   Follow Up Visit Note   04/19/2022 Name: Christine Barton MRN: [id number] DOB: February 11, 1951  Christine Barton is a 73 y.o. year old female who sees Christine Barton, Christine Fanny, MD for primary care. I spoke with  Christine Barton by phone today.  What matters to the patients health and wellness today?  Patient states she does not have any further care needs and request to close out care coordination follow up with RNCM.     Goals Addressed             This Visit's Progress    COMPLETED: Patient stated: Maintain and/ or improve health/ health education       Care Coordination Interventions: Patient re-advised to call and set up appointment with ophthalmologist:  Patient states she has not set up appointment yet.  Discussed referral to social worker with patient:  Patient declined needing social worker at this time.  Reviewed medications and discussed compliance:  Patient denies any change in current medications.  She states she will be switched to new medication ANORO due to her insurance changing.  She states this change has ot occurred yet because she has to wait until the first of the year when new insurance was active.  Patient states she will follow up on this with her provider.   Care Coordination Interventions: Discussed plans with patient for ongoing care management follow up. Patient declined further follow up with [facility] stating she doesn't feel she needs additional outreach calls. She states she seems to be handling things well at this point and will contact RNCM if she needs arise in the future.  Patient states she is scheduled to follow up with the gastroenterologist in April 2024.  Followed up with patient regarding education article sent to her on cirrhosis of the liver.  Patient states she received the information and does not have any questions concerns.                  SDOH assessments and interventions completed:  No     Care Coordination Interventions:   Yes, provided   Follow up plan: No further intervention required.   Encounter Outcome:  Pt. Visit Completed   Christine Plowman RN,BSN,CCM [place] [phone] direct line

## 2022-04-19 NOTE — Telephone Encounter (Signed)
This encounter was created in error - please disregard.

## 2022-04-22 ENCOUNTER — Telehealth: Payer: Self-pay | Admitting: Pulmonary Disease

## 2022-04-22 MED ORDER — ANORO ELLIPTA 62.5-25 MCG/ACT IN AEPB: 1.0000 | INHALATION_SPRAY | Freq: Every day | RESPIRATORY_TRACT | 1 refills | Status: DC

## 2022-04-22 NOTE — Telephone Encounter (Signed)
PT changed insurance and wants a script called in to replace her. Anora (?). Dr. Marland Kitchen PT discussed this at last visit.    Pharm is [facility] in [facility]

## 2022-04-22 NOTE — Telephone Encounter (Signed)
I sent in the Anoro to the patient's pharmacy and notified the patient.  Nothing further needed.

## 2022-05-06 ENCOUNTER — Encounter: Payer: Self-pay | Admitting: Internal Medicine

## 2022-05-06 ENCOUNTER — Other Ambulatory Visit: Payer: Medicare HMO

## 2022-05-06 DIAGNOSIS — Z515 Encounter for palliative care: Secondary | ICD-10-CM

## 2022-05-06 NOTE — Progress Notes (Signed)
PATIENT NAME: Christine Barton DOB: 1950/12/24 MRN: [id number]  PRIMARY CARE PROVIDER: [name], MD  RESPONSIBLE PARTY:  Acct ID - Guarantor Home Phone Work Phone Relationship Acct Type  [id number] Gomez [phone]  Self P/F     [address], [place], [place] [address]    I connected with  [name] on 05/06/22 by telephone and verified that I am speaking with the correct person using two identifiers.   I discussed the limitations of evaluation and management by telemedicine. The patient expressed understanding and agreed to proceed.   Connected with patient by phone to complete a telephonic visit and offer a home visit.  Patient continues to decline a home visit but would like a monthly phone call.    Appetite:  Patient endorses appetite has improved.  She notes most recent weight is 117 lbs. This is a 4 lb weight gain since last documented weight last month.   Mobility:  Patient endorses living alone.  She does not require any assistive devices and denies any recent falls.  Respiratory:  Patient denies any shortness of breath.  No new concerns with respiratory status.  Pain:  Patient denies any issues with pain.   Overall, patient reports she doing well.  She continues to address home issues and is hopeful this will be cleared soon.  Patient reports last Hgb was 13.  She would like to continue with Palliative Care with monthly phone calls and will let us know when she needs a home visit.       Lorenza Burton, RN

## 2022-05-11 ENCOUNTER — Other Ambulatory Visit: Payer: Self-pay | Admitting: Gastroenterology

## 2022-05-11 ENCOUNTER — Telehealth: Payer: Self-pay | Admitting: Gastroenterology

## 2022-05-11 ENCOUNTER — Other Ambulatory Visit: Payer: Self-pay | Admitting: Family Medicine

## 2022-05-11 ENCOUNTER — Telehealth: Payer: Self-pay | Admitting: Family Medicine

## 2022-05-11 ENCOUNTER — Other Ambulatory Visit: Payer: Self-pay | Admitting: Medical Oncology

## 2022-05-11 DIAGNOSIS — C3490 Malignant neoplasm of unspecified part of unspecified bronchus or lung: Secondary | ICD-10-CM

## 2022-05-11 NOTE — Telephone Encounter (Signed)
Patient is calling asking to speak with [facility] regarding her upcoming procedure. Please advise

## 2022-05-11 NOTE — Telephone Encounter (Signed)
Spoke with the patient. She was wanting to confirm she would be due a colonoscopy this year. She also wanted to know if she would be able to have this at the [facility]. I told her this nurse did not see a reason at this time that she would need to be a hospital procedure. Not on supplemental oxygen or dialysis. No recent CVA. No history of seizures. No cardiac concerns. Advised criteria or regulations may change which would possibly change her eligibility for the [place].

## 2022-05-11 NOTE — Telephone Encounter (Signed)
Patient called and stated she would like to request a blood test for her kidney. Call back number [phone].

## 2022-05-11 NOTE — Telephone Encounter (Signed)
We can't do other providers labs at the office. Called and let pt know this info and she verbalized understanding. She will get labs done at that providers office who ordered CT

## 2022-05-11 NOTE — Telephone Encounter (Signed)
Pt needs an appt incase we need to get a urine sample also, please schedule appt

## 2022-05-11 NOTE — Telephone Encounter (Signed)
Spoke to pt, pt states she has a CT scan scheduled for 05/25/22 in [place] & was instructed to have a kidney test done before scan. Pt states she can have test done at the facility but wanted to save time going all the way to [name], when our office is closer. Call back # [phone]

## 2022-05-11 NOTE — Telephone Encounter (Signed)
Last filled on 07/25/21 #120 mL w/ 0 refills, please advise

## 2022-05-12 ENCOUNTER — Telehealth: Payer: Self-pay | Admitting: Medical Oncology

## 2022-05-12 NOTE — Telephone Encounter (Signed)
Schedule message sent for lab and f/u visits .

## 2022-05-24 ENCOUNTER — Other Ambulatory Visit: Payer: Self-pay

## 2022-05-24 ENCOUNTER — Inpatient Hospital Stay: Payer: Medicare HMO | Attending: Physician Assistant

## 2022-05-24 DIAGNOSIS — K922 Gastrointestinal hemorrhage, unspecified: Secondary | ICD-10-CM | POA: Diagnosis not present

## 2022-05-24 DIAGNOSIS — D500 Iron deficiency anemia secondary to blood loss (chronic): Secondary | ICD-10-CM | POA: Diagnosis not present

## 2022-05-24 DIAGNOSIS — C3490 Malignant neoplasm of unspecified part of unspecified bronchus or lung: Secondary | ICD-10-CM

## 2022-05-24 LAB — CMP (CANCER CENTER ONLY)
ALT: 6 U/L (ref 0–44)
AST: 18 U/L (ref 15–41)
Albumin: 4.6 g/dL (ref 3.5–5.0)
Alkaline Phosphatase: 95 U/L (ref 38–126)
Anion gap: 8 (ref 5–15)
BUN: 7 mg/dL — ABNORMAL LOW (ref 8–23)
CO2: 29 mmol/L (ref 22–32)
Calcium: 10.7 mg/dL — ABNORMAL HIGH (ref 8.9–10.3)
Chloride: 96 mmol/L — ABNORMAL LOW (ref 98–111)
Creatinine: 0.74 mg/dL (ref 0.44–1.00)
GFR, Estimated: >60 mL/min (ref >60)
Glucose, Bld: 141 mg/dL — ABNORMAL HIGH (ref 70–99)
Potassium: 4.4 mmol/L (ref 3.5–5.1)
Sodium: 133 mmol/L — ABNORMAL LOW (ref 135–145)
Total Bilirubin: 0.6 mg/dL (ref 0.3–1.2)
Total Protein: 7.9 g/dL (ref 6.5–8.1)

## 2022-05-24 LAB — CBC WITH DIFFERENTIAL (CANCER CENTER ONLY)
Abs Immature Granulocytes: 0.06 10*3/uL (ref 0.00–0.07)
Basophils Absolute: 0 10*3/uL (ref 0.0–0.1)
Basophils Relative: 0 %
Eosinophils Absolute: 0.1 10*3/uL (ref 0.0–0.5)
Eosinophils Relative: 1 %
HCT: 38.7 % (ref 36.0–46.0)
Hemoglobin: 13.1 g/dL (ref 12.0–15.0)
Immature Granulocytes: 1 %
Lymphocytes Relative: 24 %
Lymphs Abs: 1.8 10*3/uL (ref 0.7–4.0)
MCH: 29.7 pg (ref 26.0–34.0)
MCHC: 33.9 g/dL (ref 30.0–36.0)
MCV: 87.8 fL (ref 80.0–100.0)
Monocytes Absolute: 0.6 10*3/uL (ref 0.1–1.0)
Monocytes Relative: 8 %
Neutro Abs: 4.9 10*3/uL (ref 1.7–7.7)
Neutrophils Relative %: 66 %
Platelet Count: 224 10*3/uL (ref 150–400)
RBC: 4.41 MIL/uL (ref 3.87–5.11)
RDW: 15.1 % (ref 11.5–15.5)
WBC Count: 7.4 10*3/uL (ref 4.0–10.5)
nRBC: 0 % (ref 0.0–0.2)

## 2022-05-24 LAB — IRON AND IRON BINDING CAPACITY (CC-WL,HP ONLY)
Iron: 51 ug/dL (ref 28–170)
Saturation Ratios: 9 % — ABNORMAL LOW (ref 10.4–31.8)
TIBC: 549 ug/dL — ABNORMAL HIGH (ref 250–450)
UIBC: 498 ug/dL — ABNORMAL HIGH (ref 148–442)

## 2022-05-25 ENCOUNTER — Ambulatory Visit
Admission: RE | Admit: 2022-05-25 | Discharge: 2022-05-25 | Disposition: A | Discharge status: Home / Self Care | Payer: Medicare HMO | Source: Ambulatory Visit | Attending: Internal Medicine | Admitting: Internal Medicine

## 2022-05-25 DIAGNOSIS — C3490 Malignant neoplasm of unspecified part of unspecified bronchus or lung: Secondary | ICD-10-CM | POA: Insufficient documentation

## 2022-05-25 DIAGNOSIS — J439 Emphysema, unspecified: Secondary | ICD-10-CM | POA: Diagnosis not present

## 2022-05-25 LAB — FERRITIN: Ferritin: 10 ng/mL — ABNORMAL LOW (ref 11–307)

## 2022-05-25 MED ORDER — IOHEXOL 300 MG/ML  SOLN
75.0000 mL | Freq: Once | INTRAMUSCULAR | Status: AC | PRN
  Administered 2022-05-25: 75 mL via INTRAVENOUS

## 2022-05-27 ENCOUNTER — Telehealth: Payer: Self-pay | Admitting: Gastroenterology

## 2022-05-27 NOTE — Telephone Encounter (Signed)
Inbound call from patient regarding medication"Ursodiol" she is getting some side effects and want to know if she can get a different one..Please advise

## 2022-05-28 ENCOUNTER — Other Ambulatory Visit: Payer: Self-pay

## 2022-05-28 MED ORDER — URSODIOL 300 MG PO CAPS: ORAL_CAPSULE | ORAL | 3 refills | Status: DC

## 2022-05-28 NOTE — Telephone Encounter (Signed)
Spoke with the patient. She was told she was taken off of ursodiol and it was going to be replaced with a different medication. She said she is okay with that or whatever Dr [name] thought was best.   Medication list shows the ursodiol was refilled, and this could be a misunderstanding. Is there a plan to change the medication? Patient will be here for follow up in March.

## 2022-05-28 NOTE — Telephone Encounter (Signed)
Spoke with the patient. She does not recall having any issues. Pharmacist mentioned she was being taken off the medication. Patient will continue the medication and discuss it further at her appointment.

## 2022-05-28 NOTE — Telephone Encounter (Signed)
It is fine to hold ursodiol if she is not tolerating it, I will discuss further when she comes in for her follow-up visit in March.  Thank you

## 2022-05-30 NOTE — Progress Notes (Unsigned)
[place] OFFICE PROGRESS NOTE  Tower, [name], MD [place] [id number]  DIAGNOSIS: 1) Anemia secondary to GI occult blood loss.   2) suspicious for stage Ia (T1 a, N0, M0) lung cancer.  The patient declined bronchoscopy for tissue biopsy.  PRIOR THERAPY: 1) Oral iron supplements. Discontinued due to intolerance (severe abdominal pain) 2) status post SBRT to the lung lesion under the care of Dr. Donella Stade at [facility].  CURRENT THERAPY:  IV iron infusions with Monoferric PRN, last dose 01/04/22  INTERVAL HISTORY: Christine Barton 72 y.o. female returns to the clinic today for a follow up visit. She was last seen by Dr. Julien Nordmann in November 2023.  She has a history of presumed stage I non-small cell lung cancer.  The patient declined biopsy and underwent SBRT to a lung lesion under the care of Dr. Baruch Gouty at [facility] in December 2022/January 2023.  Overall, the patient states her breathing is good.  She recently was started on a new inhaler.  She also has albuterol for rescue inhaler.  She denies any shortness of breath.  She denies any significant cough unless she has phlegm for which she will then take Mucinex.  She denies any chest pain or hemoptysis.  Denies any recent upper respiratory infections. She denies any fever, chills, night sweats, or unexplained weight loss.  She has actually been gaining weight and eating well which she is happy about.  She denies any nausea, vomiting, diarrhea, or constipation.  Denies any headache or visual changes. She is also followed by our clinic for anemia for which she receives IV iron as needed her most recent being on 01/04/2022.  She does not take oral iron supplements because it upsets her stomach.  She denies any visible abnormal bleeding or bruising.  She recently had a restaging CT scan for history of lung cancer.  She is here today for evaluation to review her scan results and for more  detailed discussion about her current condition and recommended treatment options.    MEDICAL HISTORY: Past Medical History:  Diagnosis Date   Alcohol abuse, unspecified    Breast cancer ([place]) 1998   Right   Cataract    left eye   Cervical spondylosis 2006   MRI   Degeneration of cervical intervertebral disc 2006   MRI   Diabetes mellitus without complication ([place])    Diverticulosis of colon (without mention of hemorrhage)    Hyperpotassemia    Lung cancer ([place]) 03/2021   started radiation in January 2023, right side   Microscopic hematuria    Mononeuritis of unspecified site    Nonspecific abnormal results of liver function study    Other abnormal glucose    Other and unspecified hyperlipidemia    no per pt   Other chronic nonalcoholic liver disease    Personal history of chemotherapy    Personal history of malignant neoplasm of breast    Personal history of radiation therapy    Pneumothorax, acute    right, spontaneous   Tobacco use disorder    Unspecified vitamin D deficiency     ALLERGIES:  is allergic to oxycodone, crestor [rosuvastatin], glipizide, and prednisone.  MEDICATIONS:  Current Outpatient Medications  Medication Sig Dispense Refill   albuterol (VENTOLIN HFA) 108 (90 Base) MCG/ACT inhaler INHALE 2 PUFFS INTO THE LUNGS EVERY 4 HOURS AS NEEDED FOR WHEEZE OR FOR SHORTNESS OF BREATH 8.5 each 3   cholecalciferol (VITAMIN  D3) 25 MCG (1000 UNIT) tablet Take 1,000 Units by mouth 2 (two) times daily.     clorazepate (TRANXENE) 3.75 MG tablet Take 1 tablet (3.75 mg total) by mouth 2 (two) times daily as needed for anxiety. 60 tablet 2   cyclobenzaprine (FLEXERIL) 10 MG tablet TAKE 1/2 TABLET BY MOUTH AT BEDTIME AS NEEDED FOR MUSCLE SPASMS 15 tablet 3   cyclobenzaprine (FLEXERIL) 5 MG tablet Take 1 tablet (5 mg total) by mouth at bedtime. 1 tablet 0   furosemide (LASIX) 20 MG tablet TAKE 1 TABLET BY MOUTH EVERY DAY 90 tablet 1   lactulose (CHRONULAC) 10 GM/15ML  solution Take 15 mLs (10 g total) by mouth 3 (three) times daily. 236 mL 11   metFORMIN (GLUCOPHAGE-XR) 500 MG 24 hr tablet TAKE 1 TABLET BY MOUTH EVERY DAY WITH BREAKFAST 90 tablet 1   Multiple Vitamin (MULTIVITAMIN) tablet Take 1 tablet by mouth daily.     Multiple Vitamins-Minerals (PRESERVISION AREDS 2) CAPS Take 1 capsule by mouth 2 (two) times daily.     mupirocin ointment (BACTROBAN) 2 % APPLY 1 APPLICATION TOPICALLY 2 (TWO) TIMES DAILY. TO AFFECTED AREAS 22 g 2   nadolol (CORGARD) 20 MG tablet TAKE 1 TABLET BY MOUTH EVERY DAY 90 tablet 3   nystatin (MYCOSTATIN) 100000 UNIT/ML suspension TAKE 5 MLS (500,000 UNITS TOTAL) BY MOUTH 3 (THREE) TIMES DAILY. SWISH AND SWALLOW 120 mL 0   pantoprazole (PROTONIX) 40 MG tablet TAKE 1 TABLET (40 MG TOTAL) BY MOUTH TWICE A DAY BEFORE MEALS 180 tablet 1   rifaximin (XIFAXAN) 550 MG TABS tablet Take 1 tablet (550 mg total) by mouth 2 (two) times daily. 60 tablet 3   SYSTANE COMPLETE 0.6 % SOLN Apply 1 drop to eye 2 (two) times daily.     traMADol (ULTRAM) 50 MG tablet Take 1 tablet (50 mg total) by mouth every 8 (eight) hours as needed for severe pain. (Patient not taking: Reported on 06/01/2022) 15 tablet 0   umeclidinium-vilanterol (ANORO ELLIPTA) 62.5-25 MCG/ACT AEPB Inhale 1 puff into the lungs daily. 60 each 1   ursodiol (ACTIGALL) 300 MG capsule TAKE 1 CAPSULE BY MOUTH EVERY DAY 30 capsule 3   No current facility-administered medications for this visit.    SURGICAL HISTORY:  Past Surgical History:  Procedure Laterality Date   BIOPSY  07/27/2019   Procedure: BIOPSY;  Surgeon: Yetta Flock, MD;  Location: [facility];  Service: Gastroenterology;;   BREAST BIOPSY  9/03   Right   BREAST LUMPECTOMY Right 1998   CHEST TUBE INSERTION  11/02/2014   COLONOSCOPY     COLONOSCOPY WITH PROPOFOL N/A 07/27/2019   Procedure: COLONOSCOPY WITH PROPOFOL;  Surgeon: Yetta Flock, MD;  Location: [facility];  Service: Gastroenterology;  Laterality:  N/A;   ESOPHAGOGASTRODUODENOSCOPY (EGD) WITH PROPOFOL N/A 10/30/2018   Procedure: ESOPHAGOGASTRODUODENOSCOPY (EGD) WITH PROPOFOL;  Surgeon: Mauri Pole, MD;  Location: [facility];  Service: Endoscopy;  Laterality: N/A;   ESOPHAGOGASTRODUODENOSCOPY (EGD) WITH PROPOFOL N/A 03/12/2019   Procedure: ESOPHAGOGASTRODUODENOSCOPY (EGD) WITH PROPOFOL;  Surgeon: Mauri Pole, MD;  Location: [facility];  Service: Endoscopy;  Laterality: N/A;   ESOPHAGOGASTRODUODENOSCOPY (EGD) WITH PROPOFOL N/A 07/27/2019   Procedure: ESOPHAGOGASTRODUODENOSCOPY (EGD) WITH PROPOFOL;  Surgeon: Yetta Flock, MD;  Location: [facility];  Service: Gastroenterology;  Laterality: N/A;   EYE SURGERY  02/2017   cataract extraction with lens implant-left   HOT HEMOSTASIS N/A 07/27/2019   Procedure: HOT HEMOSTASIS (ARGON PLASMA COAGULATION/BICAP);  Surgeon: Yetta Flock,  MD;  Location: [facility];  Service: Gastroenterology;  Laterality: N/A;   MOUTH SURGERY     POLYPECTOMY  07/27/2019   Procedure: POLYPECTOMY;  Surgeon: Yetta Flock, MD;  Location: [facility];  Service: Gastroenterology;;   TUBAL LIGATION      REVIEW OF SYSTEMS:   Review of Systems  Constitutional: Negative for appetite change, chills, fatigue, fever and unexpected weight change.  HENT: Negative for mouth sores, nosebleeds, sore throat and trouble swallowing.   Eyes: Negative for eye problems and icterus.  Respiratory: Negative for cough, hemoptysis, shortness of breath and wheezing.   Cardiovascular: Negative for chest pain and leg swelling.  Gastrointestinal: Negative for abdominal pain, constipation, diarrhea, nausea and vomiting.  Genitourinary: Negative for bladder incontinence, difficulty urinating, dysuria, frequency and hematuria.   Musculoskeletal: Negative for back pain, gait problem, neck pain and neck stiffness.  Skin: Negative for itching and rash.  Neurological: Negative for dizziness, extremity  weakness, gait problem, headaches, light-headedness and seizures.  Hematological: Negative for adenopathy. Does not bruise/bleed easily.  Psychiatric/Behavioral: Negative for confusion, depression and sleep disturbance. The patient is not nervous/anxious.     PHYSICAL EXAMINATION:  Blood pressure (!) 144/72, pulse 82, temperature 98.1 F (36.7 C), temperature source Oral, resp. rate 16, weight 117 lb (53.1 kg), SpO2 100 %.  ECOG PERFORMANCE STATUS: 1  Physical Exam  Constitutional: Oriented to person, place, and time and well-developed, well-nourished, and in no distress.  HENT:  Head: Normocephalic and atraumatic.  Mouth/Throat: Oropharynx is clear and moist. No oropharyngeal exudate.  Eyes: Conjunctivae are normal. Right eye exhibits no discharge. Left eye exhibits no discharge. No scleral icterus.  Neck: Normal range of motion. Neck supple.  Cardiovascular: Normal rate, regular rhythm, normal heart sounds and intact distal pulses.   Pulmonary/Chest: Effort normal and breath sounds normal. No respiratory distress. No wheezes. No rales.  Abdominal: Soft. Bowel sounds are normal. Exhibits no distension and no mass. There is no tenderness.  Musculoskeletal: Normal range of motion. Exhibits no edema.  Lymphadenopathy:    No cervical adenopathy.  Neurological: Alert and oriented to person, place, and time. Exhibits normal muscle tone. Gait normal. Coordination normal.  Skin: Skin is warm and dry. No rash noted. Not diaphoretic. No erythema. No pallor.  Psychiatric: Mood, memory and judgment normal.  Vitals reviewed.  LABORATORY DATA: Lab Results  Component Value Date   WBC 7.4 05/24/2022   HGB 13.1 05/24/2022   HCT 38.7 05/24/2022   MCV 87.8 05/24/2022   PLT 224 05/24/2022      Chemistry      Component Value Date/Time   NA 133 (L) 05/24/2022 1455   NA 132 (L) 05/26/2021 1147   K 4.4 05/24/2022 1455   CL 96 (L) 05/24/2022 1455   CO2 29 05/24/2022 1455   BUN 7 (L) 05/24/2022  1455   BUN 21 05/26/2021 1147   CREATININE 0.74 05/24/2022 1455      Component Value Date/Time   CALCIUM 10.7 (H) 05/24/2022 1455   ALKPHOS 95 05/24/2022 1455   AST 18 05/24/2022 1455   ALT 6 05/24/2022 1455   BILITOT 0.6 05/24/2022 1455       RADIOGRAPHIC STUDIES:  CT Chest W Contrast  Result Date: 05/25/2022 CLINICAL DATA:  Staging non-small-cell lung cancer. Radiation treatments. Remote history of breast cancer 1998. * Tracking Code: BO * EXAM: CT CHEST WITH CONTRAST TECHNIQUE: Multidetector CT imaging of the chest was performed during intravenous contrast administration. RADIATION DOSE REDUCTION: This exam was performed according  to the departmental dose-optimization program which includes automated exposure control, adjustment of the mA and/or kV according to patient size and/or use of iterative reconstruction technique. CONTRAST:  49mL OMNIPAQUE IOHEXOL 300 MG/ML  SOLN COMPARISON:  11/20/2021 and older CT FINDINGS: Cardiovascular: Heart is nonenlarged. Trace pericardial fluid. Coronary artery calcifications are seen. Grossly normal caliber thoracic aorta with scattered vascular calcifications and some noncalcified plaque as well. There are plaque also extends along the origin of the great vessels. There is some enlargement of the pulmonary artery centrally. Please correlate for pulmonary artery hypertension Mediastinum/Nodes: Surgical clips seen in the right axillary region. No specific abnormal lymph node enlargement identified in the axillary region, hilum or mediastinum. There are a few small less than 1 cm in size in short axis mediastinal nodes. Not pathologic by size criteria and not significantly changed when adjusting for technique. Normal caliber thoracic esophagus. Once again there is a 2 cm left-sided thyroid nodule. Please correlate with the thyroid ultrasound from 12/02/2021. Lungs/Pleura: Some breathing motion. Left lung is without consolidation, pneumothorax or effusion. Apical  subpleural blebs identified. Once again there are areas of pleural thickening and interstitial septal thickening anteriorly in the right upper lobe of the lung as well as a more focal area of pleural nodularity with calcifications the right lung apex which are stable in appearance from previous examination and again could be posttreatment changes. There is some increasing nodularity and ground-glass in the superior segment of the right lower lobe with more nodularity along the adjacent major fissure compared to the study of August 2023. Nodular area on image 49 of series 3 today in this particular location measures 2.6 by 0.8 cm and previously was only minimal at 13 by 4 mm. Previously this also is much less well-defined. Upper Abdomen: Nodular heterogeneous liver consistent with known chronic liver disease with splenomegaly. Patent portal vein. Gallstones. The adrenal glands are preserved. Please correlate with prior abdominal MRI from 01/12/2022 Musculoskeletal: Mild degenerative changes along the spine. Disc bulging posteriorly at the L1-2 level with some stenosis. IMPRESSION: Increasing nodular area along the major fissure superiorly with increasing adjacent bandlike opacity. No measuring up to 2.6 x 0.8 cm. With this change would recommend further evaluation. PET-CT scan would be 1 option. Stable posttreatment changes in the right lung including at the right lung apex. No developing lymph node enlargement Chronic liver disease noted in the upper abdomen.  Gallstones Aortic Atherosclerosis (ICD10-I70.0) and Emphysema (ICD10-J43.9). Findings to be called to the ordering service by the Radiology physician assistant team on the next business day as the office is currently closed. Electronically Signed   By: Jill Side M.D.   On: 05/25/2022 19:14     ASSESSMENT/PLAN:  This is a very pleasant 72 year old Caucasian female with iron deficiency anemia likely secondary to occult GI blood loss. She also was found to  have a presumed stage IA (T2a, N0, M0) in November 2022 s/p SBRT under the care of Dr. Baruch Gouty in radiation oncology at [facility].   Regarding the anemia, the patient receives IV iron as needed most recent being with Monoferric on 01/04/2022.  Regarding the history of lung cancer, the patient recently had a restaging CT scan performed.  Dr. Julien Nordmann personally and independently reviewed the scan discussed results with the patient today.  The scan showed increased nodular area along the major fissure.  Dr. Julien Nordmann recommends a PET scan.  Dr. Julien Nordmann feels this may be inflammatory/infectious but we will obtain a PET scan to rule out  disease recurrence.  We will see her back for follow-up within 2 weeks for evaluation to review her scan results.  While her hemoglobin is normal her iron studies show continued iron deficiency.  She is unable to tolerate oral iron supplements.  We will arrange for her to receive Monoferric infusion at [facility].  She was encouraged to increase her dietary intake of iron rich food.  The patient is unable to tolerate oral iron supplements.  The patient was advised to call immediately if she has any concerning symptoms in the interval. The patient voices understanding of current disease status and treatment options and is in agreement with the current care plan. All questions were answered. The patient knows to call the clinic with any problems, questions or concerns. We can certainly see the patient much sooner if necessary   Orders Placed This Encounter  Procedures   NM PET Image Restag (PS) Skull Base To Thigh    Standing Status:   Future    Standing Expiration Date:   06/01/2023    Order Specific Question:   If indicated for the ordered procedure, I authorize the administration of a radiopharmaceutical per Radiology protocol    Answer:   Yes    Order Specific Question:   Preferred imaging location?    Answer:   [facility]      Melaine Mcphee L Ieshia Hatcher,  PA-C 06/01/22  ADDENDUM: Hematology/Oncology Attending: Had a face-to-face encounter with the patient today.  I reviewed her record, lab, scan and recommended her care plan.  This is a very pleasant 72 years old white female with history of stage Ia (T1 a, N0, M0) lung cancer that was based on imaging studies with no tissue biopsy.  She is status post SBRT to the lung lesion under the care of Dr. Donella Stade at [facility]. The patient also has a history of iron deficiency anemia secondary to gastrointestinal occult blood loss and she was treated with iron infusion with Monoferric and currently on oral iron tablets. The patient has been feeling fine today with no concerning complaints. She had repeat CT scan of the chest performed recently.  I personally and independently reviewed the scan images and discussed the result with the patient today. Her scan showed increasing nodular area along the major fissure superiorly with increased adjacent bandlike opacity measuring 2.6 x 0.8 cm. I recommended for the patient to have a PET scan for further evaluation of this lesion and to rule out any disease recurrence. I will see her back for follow-up visit in 2 weeks for evaluation and discussion of her treatment options based on the PET scan results. The patient was advised to call immediately if she has any other concerning symptoms in the interval. The total time spent in the appointment was 30 minutes. Disclaimer: This note was dictated with voice recognition software. Similar sounding words can inadvertently be transcribed and may be missed upon review. Eilleen Kempf, MD

## 2022-06-01 ENCOUNTER — Other Ambulatory Visit: Payer: Self-pay

## 2022-06-01 ENCOUNTER — Inpatient Hospital Stay: Payer: Medicare HMO | Admitting: Physician Assistant

## 2022-06-01 VITALS — BP 144/72 | HR 82 | Temp 98.1°F | Resp 16 | Wt 117.0 lb

## 2022-06-01 DIAGNOSIS — D500 Iron deficiency anemia secondary to blood loss (chronic): Secondary | ICD-10-CM | POA: Diagnosis not present

## 2022-06-01 DIAGNOSIS — C3490 Malignant neoplasm of unspecified part of unspecified bronchus or lung: Secondary | ICD-10-CM | POA: Diagnosis not present

## 2022-06-01 DIAGNOSIS — C3431 Malignant neoplasm of lower lobe, right bronchus or lung: Secondary | ICD-10-CM

## 2022-06-01 DIAGNOSIS — K922 Gastrointestinal hemorrhage, unspecified: Secondary | ICD-10-CM | POA: Diagnosis not present

## 2022-06-02 ENCOUNTER — Telehealth: Payer: Self-pay | Admitting: Physician Assistant

## 2022-06-02 ENCOUNTER — Other Ambulatory Visit: Payer: Self-pay | Admitting: Physician Assistant

## 2022-06-02 NOTE — Telephone Encounter (Signed)
I called the patient to let her know that her preferred IV iron is venofer. Therefore, I have changed the order. I have sent a scheduling message to [facility] to make sure that these get scheduled weekly x3. I called the patient to make her aware of the change in plan.

## 2022-06-03 ENCOUNTER — Telehealth: Payer: Self-pay | Admitting: Internal Medicine

## 2022-06-03 NOTE — Telephone Encounter (Signed)
Scheduled per 02/20 los, patient has been called and notified.

## 2022-06-11 ENCOUNTER — Inpatient Hospital Stay: Payer: Medicare HMO | Attending: Physician Assistant

## 2022-06-11 VITALS — BP 148/75 | HR 96 | Temp 97.9°F | Resp 16

## 2022-06-11 DIAGNOSIS — K922 Gastrointestinal hemorrhage, unspecified: Secondary | ICD-10-CM | POA: Insufficient documentation

## 2022-06-11 DIAGNOSIS — Z85118 Personal history of other malignant neoplasm of bronchus and lung: Secondary | ICD-10-CM | POA: Diagnosis not present

## 2022-06-11 DIAGNOSIS — D500 Iron deficiency anemia secondary to blood loss (chronic): Secondary | ICD-10-CM | POA: Diagnosis not present

## 2022-06-11 DIAGNOSIS — R918 Other nonspecific abnormal finding of lung field: Secondary | ICD-10-CM | POA: Insufficient documentation

## 2022-06-11 DIAGNOSIS — K5520 Angiodysplasia of colon without hemorrhage: Secondary | ICD-10-CM

## 2022-06-11 MED ORDER — SODIUM CHLORIDE 0.9 % IV SOLN
300.0000 mg | Freq: Once | INTRAVENOUS | Status: AC
  Administered 2022-06-11: 300 mg via INTRAVENOUS
  Filled 2022-06-11: qty 15

## 2022-06-11 MED ORDER — SODIUM CHLORIDE 0.9 % IV SOLN
Freq: Once | INTRAVENOUS | Status: AC
  Filled 2022-06-11: qty 250

## 2022-06-11 NOTE — Progress Notes (Signed)
Pt declined 30 minute post observation. Pt aware of possibility of delayed reaction and to go to ER for complications.

## 2022-06-15 ENCOUNTER — Ambulatory Visit
Admission: RE | Admit: 2022-06-15 | Discharge: 2022-06-15 | Disposition: A | Discharge status: Home / Self Care | Payer: Medicare HMO | Source: Ambulatory Visit | Attending: Physician Assistant | Admitting: Physician Assistant

## 2022-06-15 DIAGNOSIS — K7460 Unspecified cirrhosis of liver: Secondary | ICD-10-CM | POA: Insufficient documentation

## 2022-06-15 DIAGNOSIS — E041 Nontoxic single thyroid nodule: Secondary | ICD-10-CM | POA: Insufficient documentation

## 2022-06-15 DIAGNOSIS — I2510 Atherosclerotic heart disease of native coronary artery without angina pectoris: Secondary | ICD-10-CM | POA: Diagnosis not present

## 2022-06-15 DIAGNOSIS — K8020 Calculus of gallbladder without cholecystitis without obstruction: Secondary | ICD-10-CM | POA: Insufficient documentation

## 2022-06-15 DIAGNOSIS — R918 Other nonspecific abnormal finding of lung field: Secondary | ICD-10-CM | POA: Diagnosis not present

## 2022-06-15 DIAGNOSIS — C3490 Malignant neoplasm of unspecified part of unspecified bronchus or lung: Secondary | ICD-10-CM | POA: Insufficient documentation

## 2022-06-15 DIAGNOSIS — I700 Atherosclerosis of aorta: Secondary | ICD-10-CM | POA: Insufficient documentation

## 2022-06-15 LAB — GLUCOSE, CAPILLARY: Glucose-Capillary: 117 mg/dL — ABNORMAL HIGH (ref 70–99)

## 2022-06-15 MED ORDER — FLUDEOXYGLUCOSE F - 18 (FDG) INJECTION
6.7000 | Freq: Once | INTRAVENOUS | Status: AC | PRN
  Administered 2022-06-15: 6.7 via INTRAVENOUS

## 2022-06-17 ENCOUNTER — Encounter: Payer: Self-pay | Admitting: Family Medicine

## 2022-06-17 ENCOUNTER — Ambulatory Visit
Admission: RE | Admit: 2022-06-17 | Discharge: 2022-06-17 | Disposition: A | Discharge status: Home / Self Care | Payer: Medicare HMO | Source: Ambulatory Visit | Attending: Family Medicine | Admitting: Family Medicine

## 2022-06-17 ENCOUNTER — Encounter: Payer: Self-pay | Admitting: *Deleted

## 2022-06-17 DIAGNOSIS — E2839 Other primary ovarian failure: Secondary | ICD-10-CM

## 2022-06-17 DIAGNOSIS — M8580 Other specified disorders of bone density and structure, unspecified site: Secondary | ICD-10-CM | POA: Insufficient documentation

## 2022-06-17 DIAGNOSIS — M8589 Other specified disorders of bone density and structure, multiple sites: Secondary | ICD-10-CM | POA: Diagnosis not present

## 2022-06-17 DIAGNOSIS — Z780 Asymptomatic menopausal state: Secondary | ICD-10-CM | POA: Diagnosis not present

## 2022-06-18 ENCOUNTER — Inpatient Hospital Stay: Payer: Medicare HMO

## 2022-06-18 VITALS — BP 140/65 | HR 65 | Temp 98.5°F | Resp 18

## 2022-06-18 DIAGNOSIS — Z85118 Personal history of other malignant neoplasm of bronchus and lung: Secondary | ICD-10-CM | POA: Diagnosis not present

## 2022-06-18 DIAGNOSIS — D500 Iron deficiency anemia secondary to blood loss (chronic): Secondary | ICD-10-CM | POA: Diagnosis not present

## 2022-06-18 DIAGNOSIS — K922 Gastrointestinal hemorrhage, unspecified: Secondary | ICD-10-CM | POA: Diagnosis not present

## 2022-06-18 DIAGNOSIS — K5520 Angiodysplasia of colon without hemorrhage: Secondary | ICD-10-CM

## 2022-06-18 DIAGNOSIS — R918 Other nonspecific abnormal finding of lung field: Secondary | ICD-10-CM | POA: Diagnosis not present

## 2022-06-18 MED ORDER — SODIUM CHLORIDE 0.9 % IV SOLN
Freq: Once | INTRAVENOUS | Status: AC
  Filled 2022-06-18: qty 250

## 2022-06-18 MED ORDER — SODIUM CHLORIDE 0.9 % IV SOLN
300.0000 mg | Freq: Once | INTRAVENOUS | Status: AC
  Administered 2022-06-18: 300 mg via INTRAVENOUS
  Filled 2022-06-18: qty 300

## 2022-06-18 NOTE — Progress Notes (Unsigned)
[place] OFFICE PROGRESS NOTE  Tower, [name], MD [place] [address]  DIAGNOSIS:  1) Anemia secondary to GI occult blood loss.   2) suspicious for stage Ia (T1 a, N0, M0) lung cancer.  The patient declined bronchoscopy for tissue biopsy.  PRIOR THERAPY: 1) Oral iron supplements. Discontinued due to intolerance (severe abdominal pain) 2) status post SBRT to the lung lesion under the care of Dr. Donella Stade at [facility].  CURRENT THERAPY: IV iron infusions with venofer 300 mg, last dose 06/18/22   INTERVAL HISTORY: Christine Barton 72 y.o. female returns to the clinic today for a follow-up visit.  The patient was last seen by Dr. Julien Nordmann and myself on 06/01/2022.  The patient is followed by the clinic for stage I non-small cell lung cancer and history of iron deficiency anemia.  Since last being seen, the patient recently received additional IV iron with Venofer which she tolerated well. She is scheduled for her last weekly dose on 06/25/22. Regarding her lung cancer, the patient had a restaging CT scan in February 2024 that showed increased nodular area along the major fissure.  Therefore, she had a PET scan to further evaluate this.  Otherwise, since last being seen the patient denies any major changes in her health. Overall, the patient states her breathing is good.  She denies any shortness of breath.  She does have a dry cough. She reports she is still smoking cigarettes.  She denies any chest pain or hemoptysis. She denies any fever, chills, night sweats, or unexplained weight loss.  She has actually been gaining weight and eating well which she is happy about.  She denies any nausea, vomiting, diarrhea, or constipation. She is going to have a routine colonoscopy in April 2024 byt Dr. [name]. Denies any headache or visual changes. She does not take oral iron supplements because it upsets her stomach. She denies any visible abnormal bleeding or  bruising. She is here today for evaluation to review her scan results and for more detailed discussion about her current condition and recommended treatment options.    MEDICAL HISTORY: Past Medical History:  Diagnosis Date   Alcohol abuse, unspecified    Breast cancer ([place]) 1998   Right   Cataract    left eye   Cervical spondylosis 2006   MRI   Degeneration of cervical intervertebral disc 2006   MRI   Diabetes mellitus without complication ([place])    Diverticulosis of colon (without mention of hemorrhage)    Hyperpotassemia    Lung cancer ([place]) 03/2021   started radiation in January 2023, right side   Microscopic hematuria    Mononeuritis of unspecified site    Nonspecific abnormal results of liver function study    Other abnormal glucose    Other and unspecified hyperlipidemia    no per pt   Other chronic nonalcoholic liver disease    Personal history of chemotherapy    Personal history of malignant neoplasm of breast    Personal history of radiation therapy    Pneumothorax, acute    right, spontaneous   Tobacco use disorder    Unspecified vitamin D deficiency     ALLERGIES:  is allergic to oxycodone, crestor [rosuvastatin], glipizide, and prednisone.  MEDICATIONS:  Current Outpatient Medications  Medication Sig Dispense Refill   albuterol (VENTOLIN HFA) 108 (90 Base) MCG/ACT inhaler INHALE 2 PUFFS INTO THE LUNGS EVERY 4 HOURS AS NEEDED FOR WHEEZE OR FOR SHORTNESS  OF BREATH 8.5 each 3   cholecalciferol (VITAMIN D3) 25 MCG (1000 UNIT) tablet Take 1,000 Units by mouth 2 (two) times daily.     clorazepate (TRANXENE) 3.75 MG tablet Take 1 tablet (3.75 mg total) by mouth 2 (two) times daily as needed for anxiety. 60 tablet 2   cyclobenzaprine (FLEXERIL) 10 MG tablet TAKE 1/2 TABLET BY MOUTH AT BEDTIME AS NEEDED FOR MUSCLE SPASMS 15 tablet 3   cyclobenzaprine (FLEXERIL) 5 MG tablet Take 1 tablet (5 mg total) by mouth at bedtime. 1 tablet 0   furosemide (LASIX) 20 MG tablet  TAKE 1 TABLET BY MOUTH EVERY DAY 90 tablet 1   lactulose (CHRONULAC) 10 GM/15ML solution Take 15 mLs (10 g total) by mouth 3 (three) times daily. 236 mL 11   metFORMIN (GLUCOPHAGE-XR) 500 MG 24 hr tablet TAKE 1 TABLET BY MOUTH EVERY DAY WITH BREAKFAST 90 tablet 1   Multiple Vitamin (MULTIVITAMIN) tablet Take 1 tablet by mouth daily.     Multiple Vitamins-Minerals (PRESERVISION AREDS 2) CAPS Take 1 capsule by mouth 2 (two) times daily.     mupirocin ointment (BACTROBAN) 2 % APPLY 1 APPLICATION TOPICALLY 2 (TWO) TIMES DAILY. TO AFFECTED AREAS 22 g 2   nadolol (CORGARD) 20 MG tablet TAKE 1 TABLET BY MOUTH EVERY DAY 90 tablet 3   nystatin (MYCOSTATIN) 100000 UNIT/ML suspension TAKE 5 MLS (500,000 UNITS TOTAL) BY MOUTH 3 (THREE) TIMES DAILY. SWISH AND SWALLOW 120 mL 0   pantoprazole (PROTONIX) 40 MG tablet TAKE 1 TABLET (40 MG TOTAL) BY MOUTH TWICE A DAY BEFORE MEALS 180 tablet 1   rifaximin (XIFAXAN) 550 MG TABS tablet Take 1 tablet (550 mg total) by mouth 2 (two) times daily. 60 tablet 3   SYSTANE COMPLETE 0.6 % SOLN Apply 1 drop to eye 2 (two) times daily.     traMADol (ULTRAM) 50 MG tablet Take 1 tablet (50 mg total) by mouth every 8 (eight) hours as needed for severe pain. (Patient not taking: Reported on 06/01/2022) 15 tablet 0   umeclidinium-vilanterol (ANORO ELLIPTA) 62.5-25 MCG/ACT AEPB Inhale 1 puff into the lungs daily. 60 each 1   ursodiol (ACTIGALL) 300 MG capsule TAKE 1 CAPSULE BY MOUTH EVERY DAY 30 capsule 3   No current facility-administered medications for this visit.    SURGICAL HISTORY:  Past Surgical History:  Procedure Laterality Date   BIOPSY  07/27/2019   Procedure: BIOPSY;  Surgeon: Yetta Flock, MD;  Location: [facility];  Service: Gastroenterology;;   BREAST BIOPSY  9/03   Right   BREAST LUMPECTOMY Right 1998   CHEST TUBE INSERTION  11/02/2014   COLONOSCOPY     COLONOSCOPY WITH PROPOFOL N/A 07/27/2019   Procedure: COLONOSCOPY WITH PROPOFOL;  Surgeon:  Yetta Flock, MD;  Location: [facility];  Service: Gastroenterology;  Laterality: N/A;   ESOPHAGOGASTRODUODENOSCOPY (EGD) WITH PROPOFOL N/A 10/30/2018   Procedure: ESOPHAGOGASTRODUODENOSCOPY (EGD) WITH PROPOFOL;  Surgeon: Mauri Pole, MD;  Location: [facility];  Service: Endoscopy;  Laterality: N/A;   ESOPHAGOGASTRODUODENOSCOPY (EGD) WITH PROPOFOL N/A 03/12/2019   Procedure: ESOPHAGOGASTRODUODENOSCOPY (EGD) WITH PROPOFOL;  Surgeon: Mauri Pole, MD;  Location: [facility];  Service: Endoscopy;  Laterality: N/A;   ESOPHAGOGASTRODUODENOSCOPY (EGD) WITH PROPOFOL N/A 07/27/2019   Procedure: ESOPHAGOGASTRODUODENOSCOPY (EGD) WITH PROPOFOL;  Surgeon: Yetta Flock, MD;  Location: [facility];  Service: Gastroenterology;  Laterality: N/A;   EYE SURGERY  02/2017   cataract extraction with lens implant-left   HOT HEMOSTASIS N/A 07/27/2019   Procedure: HOT  HEMOSTASIS (ARGON PLASMA COAGULATION/BICAP);  Surgeon: Yetta Flock, MD;  Location: [facility];  Service: Gastroenterology;  Laterality: N/A;   MOUTH SURGERY     POLYPECTOMY  07/27/2019   Procedure: POLYPECTOMY;  Surgeon: Yetta Flock, MD;  Location: [facility];  Service: Gastroenterology;;   TUBAL LIGATION      REVIEW OF SYSTEMS:   Review of Systems  Constitutional: Negative for appetite change, chills, fatigue, fever and unexpected weight change.  HENT:   Negative for mouth sores, nosebleeds, sore throat and trouble swallowing.   Eyes: Negative for eye problems and icterus.  Respiratory: Negative for cough, hemoptysis, shortness of breath and wheezing.   Cardiovascular: Negative for chest pain and leg swelling.  Gastrointestinal: Negative for abdominal pain, constipation, diarrhea, nausea and vomiting.  Genitourinary: Negative for bladder incontinence, difficulty urinating, dysuria, frequency and hematuria.   Musculoskeletal: Negative for back pain, gait problem, neck pain and neck stiffness.   Skin: Negative for itching and rash.  Neurological: Negative for dizziness, extremity weakness, gait problem, headaches, light-headedness and seizures.  Hematological: Negative for adenopathy. Does not bruise/bleed easily.  Psychiatric/Behavioral: Negative for confusion, depression and sleep disturbance. The patient is not nervous/anxious.     PHYSICAL EXAMINATION:  Blood pressure 123/60, pulse 81, temperature (!) 97.5 F (36.4 C), resp. rate 18, weight 115 lb 14.4 oz (52.6 kg), SpO2 100 %.  ECOG PERFORMANCE STATUS: 1  Physical Exam  Constitutional: Oriented to person, place, and thin appearing female and in no distress.  HENT:  Head: Normocephalic and atraumatic.  Mouth/Throat: Oropharynx is clear and moist. No oropharyngeal exudate.  Eyes: Conjunctivae are normal. Right eye exhibits no discharge. Left eye exhibits no discharge. No scleral icterus.  Neck: Normal range of motion. Neck supple.  Cardiovascular: Normal rate, regular rhythm, normal heart sounds and intact distal pulses.   Pulmonary/Chest: Effort normal and breath sounds normal. No respiratory distress. No wheezes. No rales.  Abdominal: Soft. Bowel sounds are normal. Exhibits no distension and no mass. There is no tenderness.  Musculoskeletal: Normal range of motion. Exhibits no edema.  Lymphadenopathy:    No cervical adenopathy.  Neurological: Alert and oriented to person, place, and time. Exhibits normal muscle tone. Gait normal. Coordination normal.  Skin: Skin is warm and dry. No rash noted. Not diaphoretic. No erythema. No pallor.  Psychiatric: Mood, memory and judgment normal.  Vitals reviewed.  LABORATORY DATA: Lab Results  Component Value Date   WBC 8.0 06/22/2022   HGB 11.8 (L) 06/22/2022   HCT 34.9 (L) 06/22/2022   MCV 88.1 06/22/2022   PLT 200 06/22/2022      Chemistry      Component Value Date/Time   NA 133 (L) 06/22/2022 1436   NA 132 (L) 05/26/2021 1147   K 4.3 06/22/2022 1436   CL 100  06/22/2022 1436   CO2 27 06/22/2022 1436   BUN 14 06/22/2022 1436   BUN 21 05/26/2021 1147   CREATININE 0.73 06/22/2022 1436      Component Value Date/Time   CALCIUM 10.1 06/22/2022 1436   ALKPHOS 87 06/22/2022 1436   AST 15 06/22/2022 1436   ALT 6 06/22/2022 1436   BILITOT 0.5 06/22/2022 1436       RADIOGRAPHIC STUDIES:  DG Bone Density  Result Date: 06/17/2022 EXAM: DUAL X-RAY ABSORPTIOMETRY (DXA) FOR BONE MINERAL DENSITY IMPRESSION: Referring Physician:  [place] A TOWER Your patient completed a bone mineral density test using GE Lunar iDXA system (analysis version: 16). Technologist:    lmn PATIENT: Name:  Christine Barton, Christine Barton Patient ID: [id number] Birth Date: [birth year] Height: 64.5 in. Sex: Female Measured: 06/17/2022 Weight: 113.8 lbs. Indications: Breast Cancer History, Caucasian, COPD, Diabetic non insulin, Estrogen Deficient, Postmenopausal Fractures: None Treatments: Calcium (E943.0), Vitamin D (E933.5) ASSESSMENT: The BMD measured at Forearm Radius 33% is 0.714 g/cm2 with a T-score of -1.8. This patient is considered osteopenic/low bone mass according to [facility]) criteria. The quality of the exam is good. The lumbar spine was excluded due to degenerative changes as noted on previous exam. Site Region Measured Date Measured Age YA BMD Significant CHANGE T-score Left Forearm Radius 33% 06/17/2022 71.5 -1.8 0.714 g/cm2 * Left Forearm Radius 33% 11/03/2016 65.8 0.2 0.906 g/cm2 DualFemur Neck Left 06/17/2022 71.5 -1.3 0.862 g/cm2 * DualFemur Neck Left 11/03/2016 65.8 0.3 1.078 g/cm2 DualFemur Total Mean 06/17/2022 71.5 -0.7 0.914 g/cm2 * DualFemur Total Mean 11/03/2016 65.8 0.0 1.003 g/cm2 World Health Organization [facility]) criteria for post-menopausal, Caucasian Women: Normal       T-score at or above -1 SD Osteopenia   T-score between -1 and -2.5 SD Osteoporosis T-score at or below -2.5 SD RECOMMENDATION: 1. All patients should optimize calcium and vitamin D intake. 2.  Consider FDA-approved medical therapies in postmenopausal women and men aged 46 years and older, based on the following: a. A hip or vertebral (clinical or morphometric) fracture. b. T-score = -2.5 at the femoral neck or spine after appropriate evaluation to exclude secondary causes. c. Low bone mass (T-score between -1.0 and -2.5 at the femoral neck or spine) and a 10-year probability of a hip fracture = 3% or a 10-year probability of a major osteoporosis-related fracture = 20% based on the [place]-adapted WHO algorithm. d. Clinician judgment and/or patient preferences may indicate treatment for people with 10-year fracture probabilities above or below these levels. FOLLOW-UP: Patients with diagnosis of osteoporosis or at high risk for fracture should have regular bone mineral density tests.? Patients eligible for Medicare are allowed routine testing every 2 years.? The testing frequency can be increased to one year for patients who have rapidly progressing disease, are receiving or discontinuing medical therapy to restore bone mass, or have additional risk factors. I have reviewed this study and agree with the findings. [facility] FRAX* 10-year Probability of Fracture Based on femoral neck BMD: DualFemur (Left) Major Osteoporotic Fracture: 8.2% Hip Fracture:                1.2% Population:                  [place] (Caucasian) Risk Factors:                None *FRAX is a Materials engineer of the [employer] for Metabolic Bone Disease, a World Pharmacologist (WHO) [facility]. ASSESSMENT: The probability of a major osteoporotic fracture is 8.2% within the next ten years. The probability of a hip fracture is 1.2% within the next ten years. Electronically Signed   By: Zerita Boers M.D.   On: 06/17/2022 13:42   NM PET Image Restag (PS) Skull Base To Thigh  Result Date: 06/16/2022 CLINICAL DATA:  Subsequent treatment strategy for non-small cell lung cancer with  increased right pleural nodularity on recent CT. EXAM: NUCLEAR MEDICINE PET SKULL BASE TO THIGH TECHNIQUE: 6.7 mCi F-18 FDG was injected intravenously. Full-ring PET imaging was performed from the skull base to thigh after the radiotracer. CT data was obtained and used for attenuation correction and anatomic localization. Fasting  blood glucose: 170 mg/dl COMPARISON:  CT chest 05/25/2022, abdominal MRI 01/12/2022 and chest CT 11/20/2021. PET-CT 02/11/2021. FINDINGS: Mediastinal blood pool activity: SUV max 2.0 NECK: No hypermetabolic cervical lymph nodes are identified. No suspicious activity identified within the pharyngeal mucosal space. Incidental CT findings: Stable 2.2 x 1.5 cm left thyroid nodule on image 36/4 without hypermetabolic activity. This has been evaluated by thyroid ultrasound on 12/02/2021. Bilateral carotid atherosclerosis. CHEST: There are no hypermetabolic mediastinal, hilar or axillary lymph nodes. Evolving radiation changes in the superior segment of the right lower lobe. The increasing nodular density associated with the superior aspect of the major fissure shows only low level metabolic activity (SUV max 2.3). This density is unchanged from the recent CT, measuring approximately 2.3 x 0.7 cm on image 51/4. There is minimally increased activity along the chronic partially calcified scarring anteriorly at the right lung apex (SUV max 3.3). No discrete corresponding nodule is seen in this area. No other hypermetabolic pulmonary activity or suspicious nodularity. Incidental CT findings: Diffuse atherosclerosis of the aorta, great vessels and coronary arteries. As above, chronic partially calcified subpleural fibrosis anteriorly at the right apex. ABDOMEN/PELVIS: There is no hypermetabolic activity within the liver, adrenal glands, spleen or pancreas. There is no hypermetabolic nodal activity in the abdomen or pelvis. Incidental CT findings: Morphologic changes of cirrhosis in the liver. Multiple  small calcified gallstones. Aortic and branch vessel atherosclerosis. Mild diverticulosis of the distal colon. SKELETON: There is no hypermetabolic activity to suggest osseous metastatic disease. Incidental CT findings: Mild spondylosis. IMPRESSION: 1. No findings highly suspicious for local recurrence or metastatic disease. 2. The increasing nodular density associated with the superior aspect of the major fissure in the superior segment of the right lower lobe shows only low level metabolic activity, favoring an area of evolving radiation fibrosis. Recommend attention on continued CT follow-up. 3. Mildly increased activity associated with chronic partially calcified scarring anteriorly at the right lung apex, also likely inflammatory. 4. Stable left thyroid nodule without hypermetabolic activity. Please refer to thyroid ultrasound on 12/02/2021. 5. Cholelithiasis and cirrhosis. 6.  Aortic Atherosclerosis (ICD10-I70.0). Electronically Signed   By: Richardean Sale M.D.   On: 06/16/2022 14:11   CT Chest W Contrast  Result Date: 05/25/2022 CLINICAL DATA:  Staging non-small-cell lung cancer. Radiation treatments. Remote history of breast cancer 1998. * Tracking Code: BO * EXAM: CT CHEST WITH CONTRAST TECHNIQUE: Multidetector CT imaging of the chest was performed during intravenous contrast administration. RADIATION DOSE REDUCTION: This exam was performed according to the departmental dose-optimization program which includes automated exposure control, adjustment of the mA and/or kV according to patient size and/or use of iterative reconstruction technique. CONTRAST:  77m OMNIPAQUE IOHEXOL 300 MG/ML  SOLN COMPARISON:  11/20/2021 and older CT FINDINGS: Cardiovascular: Heart is nonenlarged. Trace pericardial fluid. Coronary artery calcifications are seen. Grossly normal caliber thoracic aorta with scattered vascular calcifications and some noncalcified plaque as well. There are plaque also extends along the origin of  the great vessels. There is some enlargement of the pulmonary artery centrally. Please correlate for pulmonary artery hypertension Mediastinum/Nodes: Surgical clips seen in the right axillary region. No specific abnormal lymph node enlargement identified in the axillary region, hilum or mediastinum. There are a few small less than 1 cm in size in short axis mediastinal nodes. Not pathologic by size criteria and not significantly changed when adjusting for technique. Normal caliber thoracic esophagus. Once again there is a 2 cm left-sided thyroid nodule. Please correlate with the thyroid ultrasound from  12/02/2021. Lungs/Pleura: Some breathing motion. Left lung is without consolidation, pneumothorax or effusion. Apical subpleural blebs identified. Once again there are areas of pleural thickening and interstitial septal thickening anteriorly in the right upper lobe of the lung as well as a more focal area of pleural nodularity with calcifications the right lung apex which are stable in appearance from previous examination and again could be posttreatment changes. There is some increasing nodularity and ground-glass in the superior segment of the right lower lobe with more nodularity along the adjacent major fissure compared to the study of August 2023. Nodular area on image 49 of series 3 today in this particular location measures 2.6 by 0.8 cm and previously was only minimal at 13 by 4 mm. Previously this also is much less well-defined. Upper Abdomen: Nodular heterogeneous liver consistent with known chronic liver disease with splenomegaly. Patent portal vein. Gallstones. The adrenal glands are preserved. Please correlate with prior abdominal MRI from 01/12/2022 Musculoskeletal: Mild degenerative changes along the spine. Disc bulging posteriorly at the L1-2 level with some stenosis. IMPRESSION: Increasing nodular area along the major fissure superiorly with increasing adjacent bandlike opacity. No measuring up to 2.6  x 0.8 cm. With this change would recommend further evaluation. PET-CT scan would be 1 option. Stable posttreatment changes in the right lung including at the right lung apex. No developing lymph node enlargement Chronic liver disease noted in the upper abdomen.  Gallstones Aortic Atherosclerosis (ICD10-I70.0) and Emphysema (ICD10-J43.9). Findings to be called to the ordering service by the Radiology physician assistant team on the next business day as the office is currently closed. Electronically Signed   By: Jill Side M.D.   On: 05/25/2022 19:14     ASSESSMENT/PLAN:  This is a very pleasant 72 year old Caucasian female with iron deficiency anemia likely secondary to occult GI blood loss. She also was found to have a presumed stage IA (T2a, N0, M0) in November 2022 s/p SBRT under the care of Dr. Baruch Gouty in radiation oncology at [facility].    Regarding the anemia, the patient receives IV iron as needed. She is due for an infusion on 06/25/22   Regarding the history of lung cancer, the patient recently had a restaging CT scan performed. That increased nodular area along the major fissure.   Dr. Julien Nordmann recommends a PET scan.  She recently had this performed.  Dr. Julien Nordmann personally independently reviewed the scan discussed the results with the patient today.  The scan did not show any significant hypermetabolic activity in these areas just local recurrence or metastatic disease.   Dr. Julien Nordmann recommended the patient continue on observation with restaging scan in 6 months?   She was encouraged to increase her dietary intake of iron rich food.  The patient is unable to tolerate oral iron supplements.   If she has worsening symptoms of anemia in the interval,  she was advised to call us back sooner should she require additional iron infusions or repeat iron studies.   The patient was advised to call immediately if she has any concerning symptoms in the interval. The patient voices understanding of  current disease status and treatment options and is in agreement with the current care plan. All questions were answered. The patient knows to call the clinic with any problems, questions or concerns. We can certainly see the patient much sooner if necessary    Orders Placed This Encounter  Procedures   CT Chest W Contrast    Standing Status:   Future  Standing Expiration Date:   06/22/2023    Order Specific Question:   If indicated for the ordered procedure, I authorize the administration of contrast media per Radiology protocol    Answer:   Yes    Order Specific Question:   Does the patient have a contrast media/X-ray dye allergy?    Answer:   No    Order Specific Question:   Preferred imaging location?    Answer:   [facility]   CBC with Differential ([place] Only)    Standing Status:   Future    Standing Expiration Date:   06/22/2023   CMP ([place] only)    Standing Status:   Future    Standing Expiration Date:   06/22/2023   Ferritin    Standing Status:   Future    Standing Expiration Date:   06/22/2023   Iron and Iron Binding Capacity ([facility] only)    Standing Status:   Future    Standing Expiration Date:   06/22/2023     Tobe Sos Aarib Pulido, PA-C 06/22/22  ADDENDUM: Hematology/Oncology Attending: I did face-to-face encounter with the patient today.  I reviewed her record, lab, scan and recommended her care plan.  This is a very pleasant 72 years old white female with history of suspicious stage Ia non-small cell lung cancer status post SBRT.  The patient also has a history of iron deficiency anemia secondary to gastrointestinal occult blood loss and she has been treated in the past with iron infusion. She was found on the previous CT scan of the chest to have a suspicious increased nodular area along the major fissure concerning for disease recurrence.  The patient had a PET scan performed recently and it showed no findings highly suspicious for local  recurrence or metastatic disease. I recommended for the patient to continue on observation with repeat CT scan of the chest in 6 months.  Will continue to monitor her closely for the iron deficiency anemia. The patient was advised to call immediately if she has any concerning symptoms in the interval. The total time spent in the appointment was 30 minutes. Disclaimer: This note was dictated with voice recognition software. Similar sounding words can inadvertently be transcribed and may be missed upon review. Eilleen Kempf, MD

## 2022-06-21 ENCOUNTER — Other Ambulatory Visit: Payer: Self-pay | Admitting: Physician Assistant

## 2022-06-21 DIAGNOSIS — D500 Iron deficiency anemia secondary to blood loss (chronic): Secondary | ICD-10-CM

## 2022-06-21 NOTE — Progress Notes (Signed)
Christine Barton    YG:[id number]    04/07/1951  Primary Care Physician:Tower, [name], MD  Referring Physician: Tower, [name], MD [place],  [place] [address]   Chief complaint:  Cirrhosis  HPI:  72 year old very pleasant female with history of breast cancer, lung cancer, type 2 diabetes, COPD, chronic anemia secondary to occult GI blood loss and decompensated cirrhosis here for follow-up visit   I last saw her on 01/15/22. At that time her left side rib pain and abdominal pain was improved due to physical therapy.  She was experiencing intermittent constipation. She was receiving IV iron infusion, hemoglobin on saturation.  She is being managed by hematology    GI Hx:   MRI abdomen/liver December 31, 2020 1. Cirrhosis. No liver masses. 2. Trace perihepatic ascites.  Normal size spleen. 3. Cholelithiasis. Chronic mild central intrahepatic and extrahepatic biliary ductal dilatation, unchanged. CBD diameter 9 mm. No evidence of choledocholithiasis. 4. Chronic mild dilation of the dorsal pancreatic duct in the pancreatic head, unchanged, nonspecific. No pancreatic mass.   Abdominal ultrasound April 30, 2020 1. Cirrhotic liver morphology.  No focal liver lesion is identified. 2. Cholelithiasis with a positive sonographic Murphy sign. Findings raise suspicion for acute cholecystitis.   Normal AFP 3.7 in January 2022 MRI liver 01-08-2020: Stable cirrhosis with no evidence of hepatocellular carcinoma. Choledocholithiasis with stable CBD. Increased iron deposition in the setting of IV iron therapy   EGD July 27, 2019 by Dr. Havery Moros: Small esophageal varices and portal hypertensive gastropathy otherwise normal exam   Colonoscopy July 27, 2019: 8 small AVMs scattered in the colon ablated with APC and 3 small polyps removed, diverticulosis and internal hemorrhoids   EGD February 23, 2019: Small grade 1 esophageal varices, portal hypertensive  gastropathy   Colonoscopy November 30, 2016: Diverticulosis, sessile polyps X4 tubular adenomas and internal hemorrhoids.  Recall colonoscopy in 3 years        Current Outpatient Medications:    albuterol (VENTOLIN HFA) 108 (90 Base) MCG/ACT inhaler, INHALE 2 PUFFS INTO THE LUNGS EVERY 4 HOURS AS NEEDED FOR WHEEZE OR FOR SHORTNESS OF BREATH, Disp: 8.5 each, Rfl: 3   cholecalciferol (VITAMIN D3) 25 MCG (1000 UNIT) tablet, Take 1,000 Units by mouth 2 (two) times daily., Disp: , Rfl:    clorazepate (TRANXENE) 3.75 MG tablet, Take 1 tablet (3.75 mg total) by mouth 2 (two) times daily as needed for anxiety., Disp: 60 tablet, Rfl: 2   cyclobenzaprine (FLEXERIL) 10 MG tablet, TAKE 1/2 TABLET BY MOUTH AT BEDTIME AS NEEDED FOR MUSCLE SPASMS, Disp: 15 tablet, Rfl: 3   cyclobenzaprine (FLEXERIL) 5 MG tablet, Take 1 tablet (5 mg total) by mouth at bedtime., Disp: 1 tablet, Rfl: 0   furosemide (LASIX) 20 MG tablet, TAKE 1 TABLET BY MOUTH EVERY DAY, Disp: 90 tablet, Rfl: 1   lactulose (CHRONULAC) 10 GM/15ML solution, Take 15 mLs (10 g total) by mouth 3 (three) times daily., Disp: 236 mL, Rfl: 11   metFORMIN (GLUCOPHAGE-XR) 500 MG 24 hr tablet, TAKE 1 TABLET BY MOUTH EVERY DAY WITH BREAKFAST, Disp: 90 tablet, Rfl: 1   Multiple Vitamin (MULTIVITAMIN) tablet, Take 1 tablet by mouth daily., Disp: , Rfl:    Multiple Vitamins-Minerals (PRESERVISION AREDS 2) CAPS, Take 1 capsule by mouth 2 (two) times daily., Disp: , Rfl:    mupirocin ointment (BACTROBAN) 2 %, APPLY 1 APPLICATION TOPICALLY 2 (TWO) TIMES DAILY. TO AFFECTED AREAS, Disp:  22 g, Rfl: 2   nadolol (CORGARD) 20 MG tablet, TAKE 1 TABLET BY MOUTH EVERY DAY, Disp: 90 tablet, Rfl: 3   nystatin (MYCOSTATIN) 100000 UNIT/ML suspension, TAKE 5 MLS (500,000 UNITS TOTAL) BY MOUTH 3 (THREE) TIMES DAILY. SWISH AND SWALLOW, Disp: 120 mL, Rfl: 0   pantoprazole (PROTONIX) 40 MG tablet, TAKE 1 TABLET (40 MG TOTAL) BY MOUTH TWICE A DAY BEFORE MEALS, Disp: 180 tablet, Rfl: 1    rifaximin (XIFAXAN) 550 MG TABS tablet, Take 1 tablet (550 mg total) by mouth 2 (two) times daily., Disp: 60 tablet, Rfl: 3   SYSTANE COMPLETE 0.6 % SOLN, Apply 1 drop to eye 2 (two) times daily., Disp: , Rfl:    traMADol (ULTRAM) 50 MG tablet, Take 1 tablet (50 mg total) by mouth every 8 (eight) hours as needed for severe pain. (Patient not taking: Reported on 06/01/2022), Disp: 15 tablet, Rfl: 0   umeclidinium-vilanterol (ANORO ELLIPTA) 62.5-25 MCG/ACT AEPB, Inhale 1 puff into the lungs daily., Disp: 60 each, Rfl: 1   ursodiol (ACTIGALL) 300 MG capsule, TAKE 1 CAPSULE BY MOUTH EVERY DAY, Disp: 30 capsule, Rfl: 3    Allergies as of 01/15/2022 - Review Complete 01/15/2022  Allergen Reaction Noted   Oxycodone Nausea And Vomiting 11/02/2014   Crestor [rosuvastatin]  11/12/2020   Glipizide Other (See Comments) 09/12/2013   Prednisone  06/15/2021    Past Medical History:  Diagnosis Date   Alcohol abuse, unspecified    Breast cancer ([place]) 1998   Right   Cataract    left eye   Cervical spondylosis 2006   MRI   Degeneration of cervical intervertebral disc 2006   MRI   Diabetes mellitus without complication ([place])    Diverticulosis of colon (without mention of hemorrhage)    Hyperpotassemia    Lung cancer ([place]) 03/2021   started radiation in January 2023, right side   Microscopic hematuria    Mononeuritis of unspecified site    Nonspecific abnormal results of liver function study    Other abnormal glucose    Other and unspecified hyperlipidemia    no per pt   Other chronic nonalcoholic liver disease    Personal history of chemotherapy    Personal history of malignant neoplasm of breast    Personal history of radiation therapy    Pneumothorax, acute    right, spontaneous   Tobacco use disorder    Unspecified vitamin D deficiency     Past Surgical History:  Procedure Laterality Date   BIOPSY  07/27/2019   Procedure: BIOPSY;  Surgeon: Yetta Flock, MD;  Location: [facility]  ENDOSCOPY;  Service: Gastroenterology;;   BREAST BIOPSY  9/03   Right   BREAST LUMPECTOMY Right 1998   CHEST TUBE INSERTION  11/02/2014   COLONOSCOPY     COLONOSCOPY WITH PROPOFOL N/A 07/27/2019   Procedure: COLONOSCOPY WITH PROPOFOL;  Surgeon: Yetta Flock, MD;  Location: [facility] ENDOSCOPY;  Service: Gastroenterology;  Laterality: N/A;   ESOPHAGOGASTRODUODENOSCOPY (EGD) WITH PROPOFOL N/A 10/30/2018   Procedure: ESOPHAGOGASTRODUODENOSCOPY (EGD) WITH PROPOFOL;  Surgeon: Mauri Pole, MD;  Location: [facility] ENDOS[facility];  Service: Endoscopy;  Laterality: N/A;   ESOPHAGOGASTRODUODENOSCOPY (EGD) WITH PROPOFOL N/A 03/12/2019   Procedure: ESOPHAGOGASTRODUODENOSCOPY (EGD) WITH PROPOFOL;  Surgeon: Mauri Pole, MD;  Location: [facility];  Service: Endoscopy;  Laterality: N/A;   ESOPHAGOGASTRODUODENOSCOPY (EGD) WITH PROPOFOL N/A 07/27/2019   Procedure: ESOPHAGOGASTRODUODENOSCOPY (EGD) WITH PROPOFOL;  Surgeon: Yetta Flock, MD;  Location: [facility];  Service: Gastroenterology;  Laterality: N/A;  EYE SURGERY  02/2017   cataract extraction with lens implant-left   HOT HEMOSTASIS N/A 07/27/2019   Procedure: HOT HEMOSTASIS (ARGON PLASMA COAGULATION/BICAP);  Surgeon: Yetta Flock, MD;  Location: [facility];  Service: Gastroenterology;  Laterality: N/A;   MOUTH SURGERY     POLYPECTOMY  07/27/2019   Procedure: POLYPECTOMY;  Surgeon: Yetta Flock, MD;  Location: [facility];  Service: Gastroenterology;;   TUBAL LIGATION      Family History  Problem Relation Age of Onset   Heart failure Father    Heart attack Father    Colon cancer Maternal Uncle    Stroke Mother    Esophageal cancer Neg Hx    Rectal cancer Neg Hx    Stomach cancer Neg Hx    Pancreatic cancer Neg Hx     Social History   Socioeconomic History   Marital status: Single    Spouse name: Not on file   Number of children: 1   Years of education: Not on file   Highest education level: Not on file   Occupational History   Occupation: retired    Fish farm manager: [employer]  Tobacco Use   Smoking status: Every Day    Packs/day: 1.00    Years: 58.00    Total pack years: 58.00    Types: Cigarettes    Start date: 09/25/2021    Passive exposure: Past   Smokeless tobacco: Never   Tobacco comments:    0.5 ppd - 04/14/21    09/23/2021- Patient smokes 3 cigarettes daily   Vaping Use   Vaping Use: Never used  Substance and Sexual Activity   Alcohol use: Not Currently   Drug use: No   Sexual activity: Not Currently  Other Topics Concern   Not on file  Social History Narrative   Divorced      1 child      Works at [employer] Strain: [place]  (11/07/2020)   Overall Financial Resource Strain (CARDIA)    Difficulty of Paying Living Expenses: Not hard at all  Food Insecurity: No Food Insecurity (12/31/2021)   Hunger Vital Sign    Worried About Running Out of Food in the Last Year: Never true    [place] in the Last Year: Never true  Transportation Needs: No Transportation Needs (12/31/2021)   PRAPARE - Hydrologist (Medical): No    Lack of Transportation (Non-Medical): No  Physical Activity: Inactive (11/07/2020)   Exercise Vital Sign    Days of Exercise per Week: 0 days    Minutes of Exercise per Session: 0 min  Stress: No Stress Concern Present (11/07/2020)   [place]    Feeling of Stress : Not at all  Social Connections: Not on file  Intimate Partner Violence: Not At Risk (11/07/2020)   Humiliation, Afraid, Rape, and Kick questionnaire    Fear of Current or Ex-Partner: No    Emotionally Abused: No    Physically Abused: No    Sexually Abused: No      Review of systems: Review of Systems    Physical Exam: General: well-appearing ***  Eyes: sclera anicteric, no redness ENT: oral mucosa moist without  lesions, no cervical or supraclavicular lymphadenopathy CV: RRR, no JVD, no peripheral edema Resp: clear to auscultation bilaterally, normal RR and effort noted GI: soft, no  tenderness, with active bowel sounds. No guarding or palpable organomegaly noted. Skin; warm and dry, no rash or jaundice noted Neuro: awake, alert and oriented x 3. Normal gross motor function and fluent speech   Data Reviewed:  Reviewed labs, radiology imaging, old records and pertinent past GI work up   Assessment and Plan/Recommendations:  72 year old very pleasant female with history of NASH and EtOH cirrhosis      MELD-Na: 7    Chronic idiopathic constipation: Use lactulose twice daily.  She already has the prescription for hepatic encephalopathy Increase dietary fiber and water intake     No clinical evidence of hepatic decompensation Overall stable from cirrhosis standpoint   Left rib pain/costochondritis arthritis: History of lung cancer s/p radiation treatment continue physical therapy Ligament pulmonary nodule, undergoing radiation therapy     Epigastric abdominal pain: Secondary to large symptomatic gallstone, she is not a surgical candidate Continue ursodiol 300 mg daily monitor symptoms   Iron deficiency anemia: Improved with IV iron infusion Chronic GI blood loss secondary to small bowel AVMs Continue to monitor CBC, iron panel and IV iron infusion as needed.  She is being managed by hematology   Hepatic encephalopathy: Stable Continue Xifaxan and advised patient to take lactulose as needed if she has change in mood or gait.  Also advised her to take lactulose if she has less than 2 or 3 soft bowel movements per day   No evidence of volume overload or ascites on exam, continue low-dose furosemide Advised patient to stop spironolactone, noted elevated potassium 5.3 on prior labs   [facility] screening: No lesions concerning for [facility] on recent MRI.    Continue to abstain from EtOH Discussed  smoking cessation   Return in 6 months or sooner if needed  The patient was provided an opportunity to ask questions and all were answered. The patient agreed with the plan and demonstrated an understanding of the instructions.     I,Safa M Kadhim,acting as a scribe for Harl Bowie, MD.,have documented all relevant documentation on the behalf of Harl Bowie, MD,as directed by  Harl Bowie, MD while in the presence of Harl Bowie, MD.   I, Harl Bowie, MD, have reviewed all documentation for this visit. The documentation on 06/21/22 for the exam, diagnosis, procedures, and orders are all accurate and complete.   Christine Barton , MD    CC: Tower, [name], MD

## 2022-06-22 ENCOUNTER — Inpatient Hospital Stay: Payer: Medicare HMO | Admitting: Physician Assistant

## 2022-06-22 ENCOUNTER — Other Ambulatory Visit: Payer: Self-pay

## 2022-06-22 ENCOUNTER — Inpatient Hospital Stay: Payer: Medicare HMO

## 2022-06-22 VITALS — BP 123/60 | HR 81 | Temp 97.5°F | Resp 18 | Wt 115.9 lb

## 2022-06-22 DIAGNOSIS — D500 Iron deficiency anemia secondary to blood loss (chronic): Secondary | ICD-10-CM

## 2022-06-22 DIAGNOSIS — C3431 Malignant neoplasm of lower lobe, right bronchus or lung: Secondary | ICD-10-CM | POA: Diagnosis not present

## 2022-06-22 DIAGNOSIS — R918 Other nonspecific abnormal finding of lung field: Secondary | ICD-10-CM | POA: Diagnosis not present

## 2022-06-22 DIAGNOSIS — D509 Iron deficiency anemia, unspecified: Secondary | ICD-10-CM

## 2022-06-22 DIAGNOSIS — Z85118 Personal history of other malignant neoplasm of bronchus and lung: Secondary | ICD-10-CM | POA: Diagnosis not present

## 2022-06-22 DIAGNOSIS — K922 Gastrointestinal hemorrhage, unspecified: Secondary | ICD-10-CM | POA: Diagnosis not present

## 2022-06-22 LAB — CBC WITH DIFFERENTIAL (CANCER CENTER ONLY)
Abs Immature Granulocytes: 0.12 10*3/uL — ABNORMAL HIGH (ref 0.00–0.07)
Basophils Absolute: 0.1 10*3/uL (ref 0.0–0.1)
Basophils Relative: 1 %
Eosinophils Absolute: 0.1 10*3/uL (ref 0.0–0.5)
Eosinophils Relative: 1 %
HCT: 34.9 % — ABNORMAL LOW (ref 36.0–46.0)
Hemoglobin: 11.8 g/dL — ABNORMAL LOW (ref 12.0–15.0)
Immature Granulocytes: 2 %
Lymphocytes Relative: 20 %
Lymphs Abs: 1.6 10*3/uL (ref 0.7–4.0)
MCH: 29.8 pg (ref 26.0–34.0)
MCHC: 33.8 g/dL (ref 30.0–36.0)
MCV: 88.1 fL (ref 80.0–100.0)
Monocytes Absolute: 0.8 10*3/uL (ref 0.1–1.0)
Monocytes Relative: 10 %
Neutro Abs: 5.3 10*3/uL (ref 1.7–7.7)
Neutrophils Relative %: 66 %
Platelet Count: 200 10*3/uL (ref 150–400)
RBC: 3.96 MIL/uL (ref 3.87–5.11)
RDW: 16.6 % — ABNORMAL HIGH (ref 11.5–15.5)
WBC Count: 8 10*3/uL (ref 4.0–10.5)
nRBC: 0 % (ref 0.0–0.2)

## 2022-06-22 LAB — CMP (CANCER CENTER ONLY)
ALT: 6 U/L (ref 0–44)
AST: 15 U/L (ref 15–41)
Albumin: 4.3 g/dL (ref 3.5–5.0)
Alkaline Phosphatase: 87 U/L (ref 38–126)
Anion gap: 6 (ref 5–15)
BUN: 14 mg/dL (ref 8–23)
CO2: 27 mmol/L (ref 22–32)
Calcium: 10.1 mg/dL (ref 8.9–10.3)
Chloride: 100 mmol/L (ref 98–111)
Creatinine: 0.73 mg/dL (ref 0.44–1.00)
GFR, Estimated: >60 mL/min (ref >60)
Glucose, Bld: 109 mg/dL — ABNORMAL HIGH (ref 70–99)
Potassium: 4.3 mmol/L (ref 3.5–5.1)
Sodium: 133 mmol/L — ABNORMAL LOW (ref 135–145)
Total Bilirubin: 0.5 mg/dL (ref 0.3–1.2)
Total Protein: 7.4 g/dL (ref 6.5–8.1)

## 2022-06-23 ENCOUNTER — Other Ambulatory Visit: Payer: Medicare HMO

## 2022-06-23 ENCOUNTER — Ambulatory Visit: Payer: Medicare HMO | Admitting: Gastroenterology

## 2022-06-23 ENCOUNTER — Encounter: Payer: Self-pay | Admitting: Gastroenterology

## 2022-06-23 VITALS — BP 120/70 | HR 87 | Ht 64.5 in | Wt 115.2 lb

## 2022-06-23 DIAGNOSIS — D500 Iron deficiency anemia secondary to blood loss (chronic): Secondary | ICD-10-CM

## 2022-06-23 DIAGNOSIS — K529 Noninfective gastroenteritis and colitis, unspecified: Secondary | ICD-10-CM

## 2022-06-23 DIAGNOSIS — K7030 Alcoholic cirrhosis of liver without ascites: Secondary | ICD-10-CM

## 2022-06-23 DIAGNOSIS — R69 Illness, unspecified: Secondary | ICD-10-CM | POA: Diagnosis not present

## 2022-06-23 DIAGNOSIS — Z86010 Personal history of colonic polyps: Secondary | ICD-10-CM

## 2022-06-23 DIAGNOSIS — K8010 Calculus of gallbladder with chronic cholecystitis without obstruction: Secondary | ICD-10-CM

## 2022-06-23 DIAGNOSIS — K5520 Angiodysplasia of colon without hemorrhage: Secondary | ICD-10-CM

## 2022-06-23 MED ORDER — PLENVU 140 G PO SOLR: 1.0000 | Freq: Once | ORAL | 0 refills | Status: AC

## 2022-06-23 NOTE — Patient Instructions (Signed)
We have scheduled you for a colonoscopy at [facility] on 07/29/2022 at 10:30am, separate instructions have been given  Your provider has requested that you go to the basement level for lab work before leaving today. Press "B" on the elevator. The lab is located at the first door on the left as you exit the elevator.   Due to recent changes in healthcare laws, you may see the results of your imaging and laboratory studies on MyChart before your provider has had a chance to review them.  We understand that in some cases there may be results that are confusing or concerning to you. Not all laboratory results come back in the same time frame and the provider may be waiting for multiple results in order to interpret others.  Please give [place] 48 hours in order for your provider to thoroughly review all the results before contacting the office for clarification of your results.    I appreciate the  opportunity to care for you  Thank You   Harl Bowie , MD

## 2022-06-24 ENCOUNTER — Other Ambulatory Visit: Payer: Medicare HMO

## 2022-06-24 DIAGNOSIS — D500 Iron deficiency anemia secondary to blood loss (chronic): Secondary | ICD-10-CM

## 2022-06-24 DIAGNOSIS — K529 Noninfective gastroenteritis and colitis, unspecified: Secondary | ICD-10-CM | POA: Diagnosis not present

## 2022-06-24 DIAGNOSIS — K7030 Alcoholic cirrhosis of liver without ascites: Secondary | ICD-10-CM | POA: Diagnosis not present

## 2022-06-24 DIAGNOSIS — K5520 Angiodysplasia of colon without hemorrhage: Secondary | ICD-10-CM | POA: Diagnosis not present

## 2022-06-24 DIAGNOSIS — R69 Illness, unspecified: Secondary | ICD-10-CM | POA: Diagnosis not present

## 2022-06-25 ENCOUNTER — Inpatient Hospital Stay: Payer: Medicare HMO

## 2022-06-25 VITALS — BP 152/74 | HR 89 | Temp 97.8°F | Resp 19

## 2022-06-25 DIAGNOSIS — R918 Other nonspecific abnormal finding of lung field: Secondary | ICD-10-CM | POA: Diagnosis not present

## 2022-06-25 DIAGNOSIS — D500 Iron deficiency anemia secondary to blood loss (chronic): Secondary | ICD-10-CM | POA: Diagnosis not present

## 2022-06-25 DIAGNOSIS — K5520 Angiodysplasia of colon without hemorrhage: Secondary | ICD-10-CM

## 2022-06-25 DIAGNOSIS — K922 Gastrointestinal hemorrhage, unspecified: Secondary | ICD-10-CM | POA: Diagnosis not present

## 2022-06-25 DIAGNOSIS — Z85118 Personal history of other malignant neoplasm of bronchus and lung: Secondary | ICD-10-CM | POA: Diagnosis not present

## 2022-06-25 MED ORDER — SODIUM CHLORIDE 0.9 % IV SOLN
Freq: Once | INTRAVENOUS | Status: AC
  Filled 2022-06-25: qty 250

## 2022-06-25 MED ORDER — SODIUM CHLORIDE 0.9 % IV SOLN
300.0000 mg | Freq: Once | INTRAVENOUS | Status: AC
  Administered 2022-06-25: 300 mg via INTRAVENOUS
  Filled 2022-06-25: qty 300

## 2022-06-25 NOTE — Patient Instructions (Signed)

## 2022-06-26 LAB — CLOSTRIDIUM DIFFICILE EIA: C difficile Toxins A+B, EIA: NEGATIVE

## 2022-06-28 ENCOUNTER — Other Ambulatory Visit: Payer: Self-pay | Admitting: Gastroenterology

## 2022-06-28 ENCOUNTER — Telehealth: Payer: Self-pay | Admitting: Pulmonary Disease

## 2022-06-28 NOTE — Telephone Encounter (Signed)
I spoke with the patient. She said she already knows her insurance will not pay for the Spiriva. She will contact her insurance company to see what alternative medication they will pay for. She will call us back once she has contacted her insurance company.

## 2022-06-28 NOTE — Telephone Encounter (Signed)
She had to be switched from Spiriva to [facility] because that is what her insurance covers.  That was her request when we last saw her in December.  We can certainly try getting her back on Spiriva but she may need a prior authorization.

## 2022-06-28 NOTE — Telephone Encounter (Signed)
PT ret our call. States she spoke to an ins rep and they told her Spiriva IS covered by [employer] (She used to be w/[employer] and now is with [employer].)  So, could we call in [place] for her?  [facility] [place]  Also, ins covers Dentolin and Levalbuderol

## 2022-06-28 NOTE — Telephone Encounter (Signed)
PT was put on Anora inhaler but it is not working for her. Gives her a dry cough. Please call w/an alternative.  [facility] [address]

## 2022-06-28 NOTE — Telephone Encounter (Signed)
Okay to resume Spiriva 2.5 mcg 2 puffs once a day

## 2022-06-28 NOTE — Telephone Encounter (Signed)
Called and spoke to patient.  She stated that she switched from [facility] to [facility] last year. She called insurance today and was advised that insurance does cover spiriva. She would like to switch from Anoro back to Spiriva.   Dr. Patsey Berthold, please advise. Thanks

## 2022-06-29 ENCOUNTER — Telehealth: Payer: Self-pay

## 2022-06-29 MED ORDER — SPIRIVA RESPIMAT 2.5 MCG/ACT IN AERS: 2.0000 | INHALATION_SPRAY | Freq: Every day | RESPIRATORY_TRACT | 11 refills | Status: DC

## 2022-06-29 NOTE — Telephone Encounter (Signed)
PA request received via CMM for Xifaxan 550MG  tablets  PA has been submitted to [facility] and is pending determination.  Key: [id number]

## 2022-06-29 NOTE — Telephone Encounter (Signed)
PA has been APPROVED from 06/29/2022-12/26/2022

## 2022-06-29 NOTE — Telephone Encounter (Signed)
Patient is aware of below message and voiced her understanding. Spiriva sent to preferred pharmacy. [facility].  Nothing further needed.

## 2022-07-01 LAB — PANCREATIC ELASTASE, FECAL: Pancreatic Elastase-1, Stool: >500 mcg/g

## 2022-07-02 ENCOUNTER — Telehealth: Payer: Self-pay

## 2022-07-02 NOTE — Telephone Encounter (Signed)
Prior auth team please see message below. Dr. Ronalee Belts would like to do a Prior Authorization for the Spiriva. Thank you!

## 2022-07-02 NOTE — Telephone Encounter (Signed)
We received a fax saying the patient's insurance does not cover Spiriva.   Per Dr. Patsey Berthold, she would like to do a prior authorization for this. She cannot do anything with steroids or in power form.

## 2022-07-08 ENCOUNTER — Other Ambulatory Visit (HOSPITAL_COMMUNITY): Payer: Self-pay

## 2022-07-08 ENCOUNTER — Encounter: Payer: Self-pay | Admitting: Physician Assistant

## 2022-07-08 NOTE — Telephone Encounter (Signed)
Noted  

## 2022-07-08 NOTE — Telephone Encounter (Signed)
Per test claim, [facility] with next fill available on 07-20-2022. There is no indication that a prior authorization will be required. Please let the team know if there are any issues going forward. Thanks!

## 2022-07-12 ENCOUNTER — Telehealth: Payer: Self-pay

## 2022-07-12 NOTE — Progress Notes (Signed)
Care Management & Coordination Services Pharmacy Team  Reason for Encounter: Appointment Reminder  Contacted patient to confirm telephone appointment with Charlene Brooke, PharmD on 07/15/2022 at 3:00.  Spoke with patient on 07/12/2022   Do you have any problems getting your medications? No  What is your top health concern you would like to discuss at your upcoming visit? Patient stated she does not have any and everything is going good.  Have you seen any other providers since your last visit with PCP? Yes  Star Rating Drugs:  Medication:  Last Fill: Day Supply Metformin 500 mg 06/16/2022 90  Care Gaps: Annual wellness visit in last year? Yes 02/23/2022  If Diabetic: Last eye exam / retinopathy screening: Overdue Last diabetic foot exam: Up to date  Charlene Brooke, PharmD notified  Marijean Niemann, [place] Assistant [phone]

## 2022-07-15 ENCOUNTER — Telehealth: Payer: Self-pay | Admitting: Medical Oncology

## 2022-07-15 ENCOUNTER — Other Ambulatory Visit: Payer: Self-pay | Admitting: Physician Assistant

## 2022-07-15 ENCOUNTER — Ambulatory Visit: Payer: Medicare HMO | Admitting: Pharmacist

## 2022-07-15 DIAGNOSIS — D500 Iron deficiency anemia secondary to blood loss (chronic): Secondary | ICD-10-CM

## 2022-07-15 NOTE — Patient Instructions (Signed)
Visit Information  Phone number for Pharmacist: [phone]  Thank you for meeting with me to discuss your medications! Below is a summary of what we talked about during the visit:   Summary: F/U visit -COPD: pt reports compliance with Spiriva daily; there were some issues with insurance coverage but seems resolved now -Cirrhosis: pt follows with GI; she is compliant with lactulose, Xifaxan, furosemide  -Osteopenia: reviewed DEXA results and recommendation to continue Vitamin D and weight-bearing exercise  Recommendations/Changes made from today's visit: -No med changes  Follow up plan: -Health Concierge will call patient 3 months for general update -Pharmacist follow up PRN -PCP annual due ~12/25/22   Charlene Brooke, PharmD, BCACP Clinical Pharmacist [facility] [phone]
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
## 2022-07-15 NOTE — Telephone Encounter (Signed)
Lab appt made for June and sept 9th. CT scan to be done

## 2022-07-19 ENCOUNTER — Other Ambulatory Visit: Payer: Self-pay | Admitting: Family Medicine

## 2022-07-19 ENCOUNTER — Telehealth: Payer: Self-pay | Admitting: Pharmacist

## 2022-07-19 MED ORDER — BEVESPI AEROSPHERE 9-4.8 MCG/ACT IN AERO: 2.0000 | INHALATION_SPRAY | Freq: Two times a day (BID) | RESPIRATORY_TRACT | 5 refills | Status: DC

## 2022-07-19 NOTE — Telephone Encounter (Signed)
Patient reports she received a letter from her insurance and they are no longer covering Spiriva. Per new formulary, the preferred LAMA is Anoro Ellipta however she cannot tolerate dry powder inhalers. Other similar inhalers that are preferred include Bevespi and Breztri. Discussed with patient and she agreed to switch to [facility].  Inhalers are prescribed by pulmonology, Dr Jayme Cloud. Routing to prescriber for input on switching Spiriva to Bevespi.  [facility] [address] [place] [facility] [address] [place] [address] Phone: [phone] Fax: [phone]

## 2022-07-19 NOTE — Telephone Encounter (Signed)
oK to switch to Bevespi 2 puffs twice a day.  She also has sensitivities to ICS due to problems with thrush.  Avoiding Breztri due to this.  Bevespi should be efficacious.

## 2022-07-19 NOTE — Telephone Encounter (Signed)
I have sent in the prescription and notified the patient.  Nothing further needed. 

## 2022-07-22 ENCOUNTER — Encounter (HOSPITAL_COMMUNITY): Payer: Self-pay | Admitting: Gastroenterology

## 2022-07-22 NOTE — Progress Notes (Signed)
  Christine Barton  Prep instructions- reviewed  PCP-Dr [facility] Pulmonologist- Dr Sarina Ser Cardiologist-Dr Nathaniel Man  EKG-09/01/21 Echo- 06/02/21 Cath- n/a Stress- 2021 ICD/PM- n/a Blood thinner-n/a GLP-1-n/a  Anesthesia Review:  Hx of Breast CA, Lung CA,DM, COPD, anemia, cirrhosis. Saw cardiology in 09/2021 having some chest pains at that visit. Recommended f/u in 6 months, visit scheduled for dec. 2023 and per pt cant remember why but she cancelled that appt and hasn't seen cards since. Also sees pulm. last visit 03/12/22. Right now per pt feels okay no heart/breathing issues.

## 2022-07-23 ENCOUNTER — Encounter: Payer: Medicare HMO | Admitting: Gastroenterology

## 2022-07-23 DIAGNOSIS — E119 Type 2 diabetes mellitus without complications: Secondary | ICD-10-CM | POA: Diagnosis not present

## 2022-07-23 DIAGNOSIS — H524 Presbyopia: Secondary | ICD-10-CM | POA: Diagnosis not present

## 2022-07-23 DIAGNOSIS — H2511 Age-related nuclear cataract, right eye: Secondary | ICD-10-CM | POA: Diagnosis not present

## 2022-07-23 DIAGNOSIS — H353132 Nonexudative age-related macular degeneration, bilateral, intermediate dry stage: Secondary | ICD-10-CM | POA: Diagnosis not present

## 2022-07-28 ENCOUNTER — Telehealth: Payer: Self-pay

## 2022-07-28 ENCOUNTER — Telehealth: Payer: Self-pay | Admitting: Pharmacist

## 2022-07-28 ENCOUNTER — Telehealth: Payer: Self-pay | Admitting: Family Medicine

## 2022-07-28 MED ORDER — MUPIROCIN 2 % EX OINT: 1.0000 | TOPICAL_OINTMENT | Freq: Two times a day (BID) | CUTANEOUS | 1 refills | Status: DC | PRN

## 2022-07-28 NOTE — Telephone Encounter (Signed)
Patient was prescribed  mupirocin ointment (BACTROBAN) 2 % by Padonda to use to keep her anal area unirritable because she uses the bathroom a lot a day,and sometimes the area gets really irritated when she doesn't wipe well,and this ointment helps to keep it from sticking.She called to see if Dr Milinda Antis could send in a refill for her?  [id number] [place] [facility],  - [address] Phone: [phone]  Fax: [phone]

## 2022-07-28 NOTE — Telephone Encounter (Signed)
[facility]  PA team, please advise. Thanks

## 2022-07-28 NOTE — Telephone Encounter (Addendum)
Patient started [facility] yesterday, she reports significant coughing spell after using it. She does not think she is going to be able to continue using it.  She was switched to Bevespi from Spiriva Respimat earlier this month due to insurance preference. She has not tolerated dry powder inhalers including Anoro Ellipta, so other Ellipta products like Incruse, Spiriva Handihaler are likely to cause the same issue. She needs a long-acting LAMA inhaler given her COPD status. Now that she has failed the preferred options her insurance may agree to cover Spiriva.  Submitted PA for Spiriva Respimat 2.5 mcg which she tolerated previously and worked well for her.  Key: [id number] Will await insurance determination.

## 2022-07-28 NOTE — Telephone Encounter (Signed)
I sent it  If symptoms do not improve please f/u in office

## 2022-07-29 ENCOUNTER — Encounter (HOSPITAL_COMMUNITY)
Admission: RE | Disposition: A | Discharge status: Home / Self Care | Payer: Self-pay | Source: Home / Self Care | Attending: Gastroenterology

## 2022-07-29 ENCOUNTER — Ambulatory Visit (HOSPITAL_COMMUNITY): Payer: Medicare HMO | Admitting: Physician Assistant

## 2022-07-29 ENCOUNTER — Ambulatory Visit (HOSPITAL_COMMUNITY)
Admission: RE | Admit: 2022-07-29 | Discharge: 2022-07-29 | Disposition: A | Discharge status: Home / Self Care | Payer: Medicare HMO | Attending: Gastroenterology | Admitting: Gastroenterology

## 2022-07-29 ENCOUNTER — Encounter (HOSPITAL_COMMUNITY): Payer: Self-pay | Admitting: Gastroenterology

## 2022-07-29 ENCOUNTER — Other Ambulatory Visit: Payer: Self-pay

## 2022-07-29 DIAGNOSIS — K648 Other hemorrhoids: Secondary | ICD-10-CM | POA: Insufficient documentation

## 2022-07-29 DIAGNOSIS — D120 Benign neoplasm of cecum: Secondary | ICD-10-CM | POA: Diagnosis not present

## 2022-07-29 DIAGNOSIS — Z1211 Encounter for screening for malignant neoplasm of colon: Secondary | ICD-10-CM | POA: Insufficient documentation

## 2022-07-29 DIAGNOSIS — D123 Benign neoplasm of transverse colon: Secondary | ICD-10-CM | POA: Diagnosis not present

## 2022-07-29 DIAGNOSIS — K5730 Diverticulosis of large intestine without perforation or abscess without bleeding: Secondary | ICD-10-CM | POA: Diagnosis not present

## 2022-07-29 DIAGNOSIS — Z800 Family history of malignant neoplasm of digestive organs: Secondary | ICD-10-CM | POA: Insufficient documentation

## 2022-07-29 DIAGNOSIS — Z86010 Personal history of colonic polyps: Secondary | ICD-10-CM

## 2022-07-29 DIAGNOSIS — M1990 Unspecified osteoarthritis, unspecified site: Secondary | ICD-10-CM | POA: Diagnosis not present

## 2022-07-29 DIAGNOSIS — K759 Inflammatory liver disease, unspecified: Secondary | ICD-10-CM | POA: Diagnosis not present

## 2022-07-29 DIAGNOSIS — Z8249 Family history of ischemic heart disease and other diseases of the circulatory system: Secondary | ICD-10-CM | POA: Insufficient documentation

## 2022-07-29 DIAGNOSIS — Z853 Personal history of malignant neoplasm of breast: Secondary | ICD-10-CM | POA: Diagnosis not present

## 2022-07-29 DIAGNOSIS — F17210 Nicotine dependence, cigarettes, uncomplicated: Secondary | ICD-10-CM | POA: Diagnosis not present

## 2022-07-29 DIAGNOSIS — E1140 Type 2 diabetes mellitus with diabetic neuropathy, unspecified: Secondary | ICD-10-CM | POA: Diagnosis not present

## 2022-07-29 DIAGNOSIS — K5520 Angiodysplasia of colon without hemorrhage: Secondary | ICD-10-CM

## 2022-07-29 DIAGNOSIS — K635 Polyp of colon: Secondary | ICD-10-CM

## 2022-07-29 DIAGNOSIS — J449 Chronic obstructive pulmonary disease, unspecified: Secondary | ICD-10-CM | POA: Diagnosis not present

## 2022-07-29 DIAGNOSIS — Z85118 Personal history of other malignant neoplasm of bronchus and lung: Secondary | ICD-10-CM | POA: Insufficient documentation

## 2022-07-29 DIAGNOSIS — Z923 Personal history of irradiation: Secondary | ICD-10-CM | POA: Diagnosis not present

## 2022-07-29 DIAGNOSIS — K7460 Unspecified cirrhosis of liver: Secondary | ICD-10-CM | POA: Insufficient documentation

## 2022-07-29 DIAGNOSIS — D126 Benign neoplasm of colon, unspecified: Secondary | ICD-10-CM | POA: Diagnosis not present

## 2022-07-29 DIAGNOSIS — Z7984 Long term (current) use of oral hypoglycemic drugs: Secondary | ICD-10-CM | POA: Insufficient documentation

## 2022-07-29 DIAGNOSIS — Z9221 Personal history of antineoplastic chemotherapy: Secondary | ICD-10-CM | POA: Diagnosis not present

## 2022-07-29 DIAGNOSIS — I2510 Atherosclerotic heart disease of native coronary artery without angina pectoris: Secondary | ICD-10-CM | POA: Diagnosis not present

## 2022-07-29 DIAGNOSIS — D500 Iron deficiency anemia secondary to blood loss (chronic): Secondary | ICD-10-CM

## 2022-07-29 DIAGNOSIS — K529 Noninfective gastroenteritis and colitis, unspecified: Secondary | ICD-10-CM

## 2022-07-29 DIAGNOSIS — D122 Benign neoplasm of ascending colon: Secondary | ICD-10-CM | POA: Diagnosis not present

## 2022-07-29 DIAGNOSIS — K7030 Alcoholic cirrhosis of liver without ascites: Secondary | ICD-10-CM

## 2022-07-29 DIAGNOSIS — R69 Illness, unspecified: Secondary | ICD-10-CM | POA: Diagnosis not present

## 2022-07-29 HISTORY — PX: HEMOSTASIS CLIP PLACEMENT: SHX6857

## 2022-07-29 HISTORY — PX: POLYPECTOMY: SHX5525

## 2022-07-29 HISTORY — PX: COLONOSCOPY WITH PROPOFOL: SHX5780

## 2022-07-29 LAB — GLUCOSE, CAPILLARY: Glucose-Capillary: 196 mg/dL — ABNORMAL HIGH (ref 70–99)

## 2022-07-29 SURGERY — COLONOSCOPY WITH PROPOFOL
Anesthesia: Monitor Anesthesia Care

## 2022-07-29 MED ORDER — SODIUM CHLORIDE 0.9 % IV SOLN: INTRAVENOUS | Status: DC

## 2022-07-29 MED ORDER — LACTATED RINGERS IV SOLN: INTRAVENOUS | Status: DC

## 2022-07-29 MED ORDER — HYDROCORTISONE ACETATE 25 MG RE SUPP: 25.0000 mg | Freq: Two times a day (BID) | RECTAL | 1 refills | Status: DC

## 2022-07-29 MED ORDER — PROPOFOL 500 MG/50ML IV EMUL
INTRAVENOUS | Status: DC | PRN
  Administered 2022-07-29: 125 ug/kg/min via INTRAVENOUS

## 2022-07-29 MED ORDER — PROPOFOL 10 MG/ML IV BOLUS
INTRAVENOUS | Status: DC | PRN
  Administered 2022-07-29: 20 mg via INTRAVENOUS
  Administered 2022-07-29: 10 mg via INTRAVENOUS

## 2022-07-29 SURGICAL SUPPLY — 22 items

## 2022-07-29 NOTE — Op Note (Signed)
[facility] Patient Name: Christine Barton Procedure Date: 07/29/2022 MRN: [id number] Attending MD: Napoleon Form , MD, [id number] Date of Birth: 09-Mar-1951 CSN: [id number] Age: 72 Admit Type: Outpatient Procedure:                Colonoscopy Indications:              High risk colon cancer surveillance: Personal                            history of colonic polyps, High risk colon cancer                            surveillance: Personal history of adenoma (10 mm or                            greater in size), High risk colon cancer                            surveillance: Personal history of multiple (3 or                            more) adenomas Providers:                Napoleon Form, MD, Trudee Kuster, RN, Irene Shipper, Technician Referring MD:              Medicines:                Monitored Anesthesia Care Complications:            No immediate complications. Estimated Blood Loss:     Estimated blood loss was minimal. Procedure:                Pre-Anesthesia Assessment:                           - Prior to the procedure, a History and Physical                            was performed, and patient medications and                            allergies were reviewed. The patient's tolerance of                            previous anesthesia was also reviewed. The risks                            and benefits of the procedure and the sedation                            options and risks were discussed with the patient.  All questions were answered, and informed consent                            was obtained. Prior Anticoagulants: The patient has                            taken no anticoagulant or antiplatelet agents. ASA                            Grade Assessment: III - A patient with severe                            systemic disease. After reviewing the risks and                            benefits, the  patient was deemed in satisfactory                            condition to undergo the procedure.                           After obtaining informed consent, the colonoscope                            was passed under direct vision. Throughout the                            procedure, the patient's blood pressure, pulse, and                            oxygen saturations were monitored continuously. The                            [id number]-HQ190L ([id number]) Olympus colonoscope was                            introduced through the anus and advanced to the the                            cecum, identified by appendiceal orifice and                            ileocecal valve. The colonoscopy was performed                            without difficulty. The patient tolerated the                            procedure well. The quality of the bowel                            preparation was good. Scope In: 10:42:36 AM Scope Out: 11:07:24 AM Scope Withdrawal Time: 0 hours 20 minutes 55 seconds  Total Procedure Duration: 0 hours 24 minutes  48 seconds  Findings:      Hemorrhoids were found on perianal exam.      Five sessile polyps were found in the transverse colon, ascending colon       and cecum. The polyps were 4 to 7 mm in size. These polyps were removed       with a cold snare. Resection and retrieval were complete. To prevent       bleeding after the polypectomy in the ascending colon, two hemostatic       clips were successfully placed. Clip manufacturer: [employer].       There was no bleeding at the end of the procedure.      Scattered small-mouthed diverticula were found in the sigmoid colon,       descending colon, transverse colon and ascending colon.      Non-bleeding internal hemorrhoids were found during retroflexion. The       hemorrhoids were large. Impression:               - Hemorrhoids found on perianal exam.                           - Five 4 to 7 mm polyps in the transverse  colon, in                            the ascending colon and in the cecum, removed with                            a cold snare. Resected and retrieved. Clips were                            placed. Clip manufacturer: [employer].                           - Diverticulosis in the sigmoid colon, in the                            descending colon, in the transverse colon and in                            the ascending colon.                           - Non-bleeding internal hemorrhoids. Moderate Sedation:      N/A Recommendation:           - Resume previous diet.                           - Continue present medications.                           - Await pathology results.                           - No repeat colonoscopy due to age.                           - Use  hydrocortisone suppository 25 mg 1 per rectum                            once a day for 1 week. Procedure Code(s):        --- Professional ---                           [phone], Colonoscopy, flexible; with removal of                            tumor(s), polyp(s), or other lesion(s) by snare                            technique Diagnosis Code(s):        --- Professional ---                           D12.3, Benign neoplasm of transverse colon (hepatic                            flexure or splenic flexure)                           D12.2, Benign neoplasm of ascending colon                           D12.0, Benign neoplasm of cecum                           K64.8, Other hemorrhoids                           Z86.010, Personal history of colonic polyps                           K57.30, Diverticulosis of large intestine without                            perforation or abscess without bleeding CPT copyright 2022 American Medical Association. All rights reserved. The codes documented in this report are preliminary and upon coder review may  be revised to meet current compliance requirements. Napoleon Form, MD 07/29/2022  11:22:31 AM This report has been signed electronically. Number of Addenda: 0

## 2022-07-29 NOTE — Transfer of Care (Signed)
Immediate Anesthesia Transfer of Care Note  Patient: Christine Barton  Procedure(s) Performed: COLONOSCOPY WITH PROPOFOL POLYPECTOMY HEMOSTASIS CLIP PLACEMENT  Patient Location: Endoscopy Unit  Anesthesia Type:MAC  Level of Consciousness: drowsy and patient cooperative  Airway & Oxygen Therapy: Patient Spontanous Breathing and Patient connected to face mask oxygen  Post-op Assessment: Report given to RN and Post -op Vital signs reviewed and stable  Post vital signs: Reviewed and stable  Last Vitals:  Vitals Value Taken Time  BP 148/72   Temp    Pulse 94   Resp 12   SpO2 99%     Last Pain:  Vitals:   07/29/22 0942  TempSrc: Oral  PainSc: 0-No pain         Complications: No notable events documented.

## 2022-07-29 NOTE — Anesthesia Postprocedure Evaluation (Signed)
Anesthesia Post Note  Patient: Christine Barton  Procedure(s) Performed: COLONOSCOPY WITH PROPOFOL POLYPECTOMY HEMOSTASIS CLIP PLACEMENT     Patient location during evaluation: PACU Anesthesia Type: MAC Level of consciousness: awake and alert and oriented Pain management: pain level controlled Vital Signs Assessment: post-procedure vital signs reviewed and stable Respiratory status: spontaneous breathing, nonlabored ventilation and respiratory function stable Cardiovascular status: stable and blood pressure returned to baseline Postop Assessment: no apparent nausea or vomiting Anesthetic complications: no   No notable events documented.  Last Vitals:  Vitals:   07/29/22 1130 07/29/22 1140  BP: (!) 123/47 126/62  Pulse: 89 88  Resp: 20 (!) 25  Temp:    SpO2: 98% 100%    Last Pain:  Vitals:   07/29/22 1140  TempSrc:   PainSc: 0-No pain                 [name] A.

## 2022-07-29 NOTE — Anesthesia Preprocedure Evaluation (Addendum)
Anesthesia Evaluation  Patient identified by MRN, date of birth, ID band Patient awake    Reviewed: Allergy & Precautions, NPO status , Patient's Chart, lab work & pertinent test results, reviewed documented beta blocker date and time   Airway Mallampati: I  TM Distance: >3 FB Neck ROM: Full    Dental  (+) Edentulous Upper, Edentulous Lower   Pulmonary shortness of breath and with exertion, COPD,  COPD inhaler, Current Smoker and Patient abstained from smoking. Lung Ca   Pulmonary exam normal breath sounds clear to auscultation       Cardiovascular Pt. on home beta blockers + CAD  Normal cardiovascular exam Rhythm:Regular Rate:Normal     Neuro/Psych Neuropathy  Neuromuscular disease    GI/Hepatic ,,,(+) Cirrhosis   Esophageal Varices  substance abuse  alcohol use, Hepatitis -Hx/o diverticulosis Hx/o  GI AVM   Endo/Other  diabetes, Well Controlled, Type 2  Hx/o breast Ca S/P ChemoRx  Renal/GU negative Renal ROS  negative genitourinary   Musculoskeletal  (+) Arthritis , Osteoarthritis,    Abdominal   Peds  Hematology  (+) Blood dyscrasia, anemia   Anesthesia Other Findings   Reproductive/Obstetrics                             Anesthesia Physical Anesthesia Plan  ASA: 3  Anesthesia Plan: MAC   Post-op Pain Management: Minimal or no pain anticipated   Induction: Intravenous  PONV Risk Score and Plan: 1 and Treatment may vary due to age or medical condition and Propofol infusion  Airway Management Planned: Nasal Cannula and Natural Airway  Additional Equipment: None  Intra-op Plan:   Post-operative Plan:   Informed Consent: I have reviewed the patients History and Physical, chart, labs and discussed the procedure including the risks, benefits and alternatives for the proposed anesthesia with the patient or authorized representative who has indicated his/her understanding and  acceptance.       Plan Discussed with: CRNA and Anesthesiologist  Anesthesia Plan Comments:         Anesthesia Quick Evaluation

## 2022-07-29 NOTE — Telephone Encounter (Signed)
Pt notified Rx sent to pharmacy and advised of Dr. Tower's comments  

## 2022-07-29 NOTE — H&P (Signed)
[facility] History and Physical   Primary Care Physician:  Tower, Audrie Gallus, MD   Reason for Procedure:  History of adenomatous colon polyps, colon AVMs and iron deficiency anemia  Plan:    Colonoscopy with possible intervention     HPI: AUDRIANA ALDAMA is a 72 y.o. female here for surveillance colonoscopy for history of adenomatous colon polyps.  She also has history of colonic AVMs and iron deficiency anemia The risks and benefits as well as alternatives of endoscopic procedure(s) have been discussed and reviewed. All questions answered. The patient agrees to proceed.  Please refer to office visit note 06/23/2022 for additional details   Past Medical History:  Diagnosis Date   Alcohol abuse, unspecified    Breast cancer 1998   Right   Cataract    left eye   Cervical spondylosis 2006   MRI   Degeneration of cervical intervertebral disc 2006   MRI   Diabetes mellitus without complication    Diverticulosis of colon (without mention of hemorrhage)    Hyperpotassemia    Lung cancer 03/2021   started radiation in January 2023, right side   Microscopic hematuria    Mononeuritis of unspecified site    Nonspecific abnormal results of liver function study    Other abnormal glucose    Other and unspecified hyperlipidemia    no per pt   Other chronic nonalcoholic liver disease    Personal history of chemotherapy    Personal history of malignant neoplasm of breast    Personal history of radiation therapy    Pneumothorax, acute    right, spontaneous   Tobacco use disorder    Unspecified vitamin D deficiency     Past Surgical History:  Procedure Laterality Date   BIOPSY  07/27/2019   Procedure: BIOPSY;  Surgeon: Benancio Deeds, MD;  Location: [facility];  Service: Gastroenterology;;   BREAST BIOPSY  9/03   Right   BREAST LUMPECTOMY Right 1998   CHEST TUBE INSERTION  11/02/2014   COLONOSCOPY     COLONOSCOPY WITH PROPOFOL N/A 07/27/2019   Procedure:  COLONOSCOPY WITH PROPOFOL;  Surgeon: Benancio Deeds, MD;  Location: [facility];  Service: Gastroenterology;  Laterality: N/A;   ESOPHAGOGASTRODUODENOSCOPY (EGD) WITH PROPOFOL N/A 10/30/2018   Procedure: ESOPHAGOGASTRODUODENOSCOPY (EGD) WITH PROPOFOL;  Surgeon: Napoleon Form, MD;  Location: [facility];  Service: Endoscopy;  Laterality: N/A;   ESOPHAGOGASTRODUODENOSCOPY (EGD) WITH PROPOFOL N/A 03/12/2019   Procedure: ESOPHAGOGASTRODUODENOSCOPY (EGD) WITH PROPOFOL;  Surgeon: Napoleon Form, MD;  Location: [facility];  Service: Endoscopy;  Laterality: N/A;   ESOPHAGOGASTRODUODENOSCOPY (EGD) WITH PROPOFOL N/A 07/27/2019   Procedure: ESOPHAGOGASTRODUODENOSCOPY (EGD) WITH PROPOFOL;  Surgeon: Benancio Deeds, MD;  Location: [facility];  Service: Gastroenterology;  Laterality: N/A;   EYE SURGERY  02/2017   cataract extraction with lens implant-left   HOT HEMOSTASIS N/A 07/27/2019   Procedure: HOT HEMOSTASIS (ARGON PLASMA COAGULATION/BICAP);  Surgeon: Benancio Deeds, MD;  Location: [facility];  Service: Gastroenterology;  Laterality: N/A;   MOUTH SURGERY     POLYPECTOMY  07/27/2019   Procedure: POLYPECTOMY;  Surgeon: Benancio Deeds, MD;  Location: [facility];  Service: Gastroenterology;;   TUBAL LIGATION      Prior to Admission medications   Medication Sig Start Date End Date Taking? Authorizing Provider  acetaminophen (TYLENOL) 500 MG tablet Take 500 mg by mouth every 6 (six) hours as needed for moderate pain.   Yes [provider]  albuterol (VENTOLIN HFA) 108 (90 Base)  MCG/ACT inhaler INHALE 2 PUFFS INTO THE LUNGS EVERY 4 HOURS AS NEEDED FOR WHEEZE OR FOR SHORTNESS OF BREATH 04/13/22  Yes Tower, Audrie Gallus, MD  cholecalciferol (VITAMIN D3) 25 MCG (1000 UNIT) tablet Take 1,000 Units by mouth 2 (two) times daily.   Yes [provider]  clorazepate (TRANXENE) 3.75 MG tablet Take 1 tablet (3.75 mg total) by mouth 2 (two) times daily as needed for  anxiety. 02/25/22  Yes Mozingo, Thereasa Solo, NP  cyclobenzaprine (FLEXERIL) 10 MG tablet TAKE 1/2 TABLET BY MOUTH AT BEDTIME AS NEEDED FOR MUSCLE SPASMS 03/16/22  Yes Tower, Marne A, MD  furosemide (LASIX) 20 MG tablet TAKE 1 TABLET BY MOUTH EVERY DAY 06/29/22  Yes Passion Lavin, Eleonore Chiquito, MD  Glycopyrrolate-Formoterol (BEVESPI AEROSPHERE) 9-4.8 MCG/ACT AERO Inhale 2 puffs into the lungs 2 (two) times daily. 07/19/22  Yes Salena Saner, MD  lactulose (CHRONULAC) 10 GM/15ML solution Take 15 mLs (10 g total) by mouth 3 (three) times daily. Patient taking differently: Take 10 g by mouth See admin instructions. Take 10 g daily, may take a second 10 g dose as needed for constipation 10/09/21  Yes Eliceo Gladu V, MD  metFORMIN (GLUCOPHAGE-XR) 500 MG 24 hr tablet TAKE 1 TABLET BY MOUTH EVERY DAY WITH BREAKFAST 07/20/22  Yes Tower, Audrie Gallus, MD  Multiple Vitamin (MULTIVITAMIN) tablet Take 1 tablet by mouth daily.   Yes [provider]  Multiple Vitamins-Minerals (PRESERVISION AREDS 2) CAPS Take 1 capsule by mouth 2 (two) times daily. 11/16/19  Yes [provider]  mupirocin ointment (BACTROBAN) 2 % Apply 1 Application topically 2 (two) times daily as needed (wound care/ skin infection). 07/28/22  Yes Tower, Marne A, MD  nadolol (CORGARD) 20 MG tablet TAKE 1 TABLET BY MOUTH EVERY DAY 06/29/22  Yes Brissia Delisa V, MD  pantoprazole (PROTONIX) 40 MG tablet TAKE 1 TABLET (40 MG TOTAL) BY MOUTH TWICE A DAY BEFORE MEALS 01/20/22  Yes Najmah Carradine, Eleonore Chiquito, MD  SYSTANE COMPLETE 0.6 % SOLN Apply 1 drop to eye 2 (two) times daily. 11/16/19  Yes [provider]  ursodiol (ACTIGALL) 300 MG capsule TAKE 1 CAPSULE BY MOUTH EVERY DAY 05/28/22  Yes Shad Ledvina, Eleonore Chiquito, MD  XIFAXAN 550 MG TABS tablet TAKE 1 TABLET BY MOUTH 2 TIMES DAILY. 06/29/22  Yes Addie Cederberg, Eleonore Chiquito, MD  nystatin (MYCOSTATIN) 100000 UNIT/ML suspension TAKE 5 MLS (500,000 UNITS TOTAL) BY MOUTH 3 (THREE) TIMES DAILY. SWISH AND  SWALLOW Patient not taking: Reported on 07/26/2022 05/11/22   Judy Pimple, MD    Current Facility-Administered Medications  Medication Dose Route Frequency Provider Last Rate Last Admin   lactated ringers infusion   Intravenous Continuous Napoleon Form, MD 10 mL/hr at 07/29/22 1006 New Bag at 07/29/22 1006    Allergies as of 06/23/2022 - Review Complete 06/23/2022  Allergen Reaction Noted   Oxycodone Nausea And Vomiting 11/02/2014   Crestor [rosuvastatin]  11/12/2020   Glipizide Other (See Comments) 09/12/2013   Prednisone  06/15/2021    Family History  Problem Relation Age of Onset   Heart failure Father    Heart attack Father    Colon cancer Maternal Uncle    Stroke Mother    Esophageal cancer Neg Hx    Rectal cancer Neg Hx    Stomach cancer Neg Hx    Pancreatic cancer Neg Hx     Social History   Socioeconomic History   Marital status: Single    Spouse name: Not on file  Number of children: 1   Years of education: Not on file   Highest education level: Not on file  Occupational History   Occupation: retired    Associate Professor: [employer]  Tobacco Use   Smoking status: Every Day    Packs/day: 1.00    Years: 58.00    Additional pack years: 0.00    Total pack years: 58.00    Types: Cigarettes    Start date: 09/25/2021    Passive exposure: Past   Smokeless tobacco: Never   Tobacco comments:        1PPD - 03/12/2022  Vaping Use   Vaping Use: Never used  Substance and Sexual Activity   Alcohol use: Not Currently   Drug use: No   Sexual activity: Not Currently  Other Topics Concern   Not on file  Social History Narrative   Divorced      1 child      Works at [employer]         Social Determinants of Health   Financial Resource Strain: Low Risk  (11/07/2020)   Overall Financial Resource Strain (CARDIA)    Difficulty of Paying Living Expenses: Not hard at all  Food Insecurity: No Food Insecurity (12/31/2021)   Hunger Vital Sign    Worried  About Running Out of Food in the Last Year: Never true    Ran Out of Food in the Last Year: Never true  Transportation Needs: No Transportation Needs (12/31/2021)   PRAPARE - Administrator, Civil Service (Medical): No    Lack of Transportation (Non-Medical): No  Physical Activity: Inactive (11/07/2020)   Exercise Vital Sign    Days of Exercise per Week: 0 days    Minutes of Exercise per Session: 0 min  Stress: No Stress Concern Present (11/07/2020)   [employer] of Occupational Health - Occupational Stress Questionnaire    Feeling of Stress : Not at all  Social Connections: Not on file  Intimate Partner Violence: Not At Risk (11/07/2020)   Humiliation, Afraid, Rape, and Kick questionnaire    Fear of Current or Ex-Partner: No    Emotionally Abused: No    Physically Abused: No    Sexually Abused: No    Review of Systems:  All other review of systems negative except as mentioned in the HPI.  Physical Exam: Vital signs in last 24 hours: Temp:  [97.5 F (36.4 C)] 97.5 F (36.4 C) (04/18 0942) Pulse Rate:  [82] 82 (04/18 0942) Resp:  [20] 20 (04/18 0942) BP: (154)/(68) 154/68 (04/18 0942) SpO2:  [100 %] 100 % (04/18 0942) Weight:  [51.3 kg] 51.3 kg (04/18 0942)   General:   Alert, NAD Lungs:  Clear .   Heart:  Regular rate and rhythm Abdomen:  Soft, nontender and nondistended. Neuro/Psych:  Alert and cooperative. Normal mood and affect. A and O x 3   K. Scherry Ran , MD [phone]

## 2022-07-29 NOTE — Discharge Instructions (Signed)
YOU HAD AN ENDOSCOPIC PROCEDURE TODAY: Refer to the procedure report and other information in the discharge instructions given to you for any specific questions about what was found during the examination. If this information does not answer your questions, please call [place] office at [phone] to clarify.  ° °YOU SHOULD EXPECT: Some feelings of bloating in the abdomen. Passage of more gas than usual. Walking can help get rid of the air that was put into your GI tract during the procedure and reduce the bloating. If you had a lower endoscopy (such as a colonoscopy or flexible sigmoidoscopy) you may notice spotting of blood in your stool or on the toilet paper. Some abdominal soreness may be present for a day or two, also. ° °DIET: Your first meal following the procedure should be a light meal and then it is ok to progress to your normal diet. A half-sandwich or bowl of soup is an example of a good first meal. Heavy or fried foods are harder to digest and may make you feel nauseous or bloated. Drink plenty of fluids but you should avoid alcoholic beverages for 24 hours. If you had a esophageal dilation, please see attached instructions for diet.   ° °ACTIVITY: Your care partner should take you home directly after the procedure. You should plan to take it easy, moving slowly for the rest of the day. You can resume normal activity the day after the procedure however YOU SHOULD NOT DRIVE, use power tools, machinery or perform tasks that involve climbing or major physical exertion for 24 hours (because of the sedation medicines used during the test).  ° °SYMPTOMS TO REPORT IMMEDIATELY: °A gastroenterologist can be reached at any hour. Please call [phone]  for any of the following symptoms:  °Following lower endoscopy (colonoscopy, flexible sigmoidoscopy) °Excessive amounts of blood in the stool  °Significant tenderness, worsening of abdominal pains  °Swelling of the abdomen that is new, acute  °Fever of 100° or  higher  °Following upper endoscopy (EGD, EUS, ERCP, esophageal dilation) °Vomiting of blood or coffee ground material  °New, significant abdominal pain  °New, significant chest pain or pain under the shoulder blades  °Painful or persistently difficult swallowing  °New shortness of breath  °Black, tarry-looking or red, bloody stools ° °FOLLOW UP:  °If any biopsies were taken you will be contacted by phone or by letter within the next 1-3 weeks. Call [phone]  if you have not heard about the biopsies in 3 weeks.  °Please also call with any specific questions about appointments or follow up tests. ° °

## 2022-07-30 ENCOUNTER — Telehealth: Payer: Self-pay | Admitting: Family Medicine

## 2022-07-30 ENCOUNTER — Encounter: Payer: Self-pay | Admitting: Physician Assistant

## 2022-07-30 ENCOUNTER — Other Ambulatory Visit (HOSPITAL_COMMUNITY): Payer: Self-pay

## 2022-07-30 ENCOUNTER — Encounter: Payer: Self-pay | Admitting: Pulmonary Disease

## 2022-07-30 LAB — SURGICAL PATHOLOGY

## 2022-07-30 NOTE — Telephone Encounter (Signed)
Noted  

## 2022-07-30 NOTE — Telephone Encounter (Signed)
Pt called requesting to speak to [place]. Pt stated it was regarding some medication. Asked pt did she want to speak to another nurse due to Shapale is out of the office, pt stated she would like to speak to Sh[place], directly. Call back # [phone]

## 2022-07-30 NOTE — Telephone Encounter (Signed)
Per patient med list she is currently on Bevespi (LABA/LAMA). No LAMA is covered for patient at this time. Spiriva is not active on chart.

## 2022-08-02 ENCOUNTER — Encounter (HOSPITAL_COMMUNITY): Payer: Self-pay | Admitting: Gastroenterology

## 2022-08-02 NOTE — Telephone Encounter (Signed)
Called pt back and she just wanted to thank me and Dr. Milinda Antis for refilling her Bactroban, pt said PCP did recommend an OTC med that she did try it cost $20 and really didn't work but she said the Bactroban is free since it's a Rx which helps her a lot and the med also works very well. Pt just wanted to relay that to PCP incase she needs a refill in the future given all her GI issues she wanted [place] to be aware that it really helped her irritation  FYI to PCP

## 2022-08-02 NOTE — Telephone Encounter (Signed)
Glad it helps/ aware

## 2022-08-03 NOTE — Telephone Encounter (Signed)
Spiriva Respimat PA is approved through 04/12/23.

## 2022-08-03 NOTE — Addendum Note (Signed)
Addended by: Kathyrn Sheriff on: 08/03/2022 01:56 PM   Modules accepted: Orders

## 2022-08-04 NOTE — Progress Notes (Signed)
Contacted [facility]. They reprocessed claim and PA approved for patient through December 2024- They will reach out to the patient when ready for pick up.  Al Corpus, PharmD notified  Christine Barton, [facility] Clinical Pharmacy Assistant [phone]

## 2022-08-09 NOTE — Telephone Encounter (Signed)
Received message from patient, she requests update on Spiriva.

## 2022-08-10 ENCOUNTER — Encounter: Payer: Self-pay | Admitting: Gastroenterology

## 2022-08-10 ENCOUNTER — Other Ambulatory Visit: Payer: Self-pay | Admitting: Gastroenterology

## 2022-08-10 NOTE — Telephone Encounter (Cosign Needed)
Called patient; no answer; left message.  Lindsey Foltanski, PharmD notified  Antron Seth, RMA Clinical Pharmacy Assistant [phone]   

## 2022-08-10 NOTE — Telephone Encounter (Signed)
Spoke with patient, she will call [facility] for future Spiriva refills.

## 2022-08-13 ENCOUNTER — Telehealth: Payer: Self-pay

## 2022-08-13 NOTE — Telephone Encounter (Signed)
1000 Palliative Care Note  On 08/03/22, volunteer performed palliative check in call and spoke with pt. Pt prefers not to receive calls from volunteer. Would prefer to speak directly to the nurse. States, "have not heard from anyone and wondered where they were." Was appreciative of the call.  RN to call patient.  Barbette Merino, RN

## 2022-08-13 NOTE — Telephone Encounter (Signed)
1043 Palliative Care Note  RN called and spoke with pt to address concerns voiced from volunteer check in call. Pt states, " I didn't mean to concern anyone. I am doing good, just dont want to lose contact with you all." Pt denied any concerns or questions at this time. Requested nurse check in call in 2 months.   Barbette Merino, RN

## 2022-08-20 DIAGNOSIS — Z0100 Encounter for examination of eyes and vision without abnormal findings: Secondary | ICD-10-CM | POA: Diagnosis not present

## 2022-09-05 ENCOUNTER — Other Ambulatory Visit: Payer: Self-pay | Admitting: Family Medicine

## 2022-09-08 DIAGNOSIS — M79671 Pain in right foot: Secondary | ICD-10-CM | POA: Diagnosis not present

## 2022-09-08 DIAGNOSIS — M2142 Flat foot [pes planus] (acquired), left foot: Secondary | ICD-10-CM | POA: Diagnosis not present

## 2022-09-15 ENCOUNTER — Telehealth: Payer: Self-pay

## 2022-09-15 ENCOUNTER — Other Ambulatory Visit: Payer: Self-pay

## 2022-09-15 ENCOUNTER — Inpatient Hospital Stay: Payer: Medicare HMO | Attending: Physician Assistant

## 2022-09-15 DIAGNOSIS — D500 Iron deficiency anemia secondary to blood loss (chronic): Secondary | ICD-10-CM | POA: Insufficient documentation

## 2022-09-15 DIAGNOSIS — K922 Gastrointestinal hemorrhage, unspecified: Secondary | ICD-10-CM | POA: Insufficient documentation

## 2022-09-15 LAB — CBC WITH DIFFERENTIAL (CANCER CENTER ONLY)
Abs Immature Granulocytes: 0.14 10*3/uL — ABNORMAL HIGH (ref 0.00–0.07)
Basophils Absolute: 0 10*3/uL (ref 0.0–0.1)
Basophils Relative: 0 %
Eosinophils Absolute: 0.1 10*3/uL (ref 0.0–0.5)
Eosinophils Relative: 1 %
HCT: 41.1 % (ref 36.0–46.0)
Hemoglobin: 14.2 g/dL (ref 12.0–15.0)
Immature Granulocytes: 2 %
Lymphocytes Relative: 21 %
Lymphs Abs: 1.6 10*3/uL (ref 0.7–4.0)
MCH: 31.6 pg (ref 26.0–34.0)
MCHC: 34.5 g/dL (ref 30.0–36.0)
MCV: 91.3 fL (ref 80.0–100.0)
Monocytes Absolute: 0.5 10*3/uL (ref 0.1–1.0)
Monocytes Relative: 6 %
Neutro Abs: 5.4 10*3/uL (ref 1.7–7.7)
Neutrophils Relative %: 70 %
Platelet Count: 188 10*3/uL (ref 150–400)
RBC: 4.5 MIL/uL (ref 3.87–5.11)
RDW: 15.7 % — ABNORMAL HIGH (ref 11.5–15.5)
WBC Count: 7.8 10*3/uL (ref 4.0–10.5)
nRBC: 0 % (ref 0.0–0.2)

## 2022-09-15 LAB — IRON AND IRON BINDING CAPACITY (CC-WL,HP ONLY)
Iron: 77 ug/dL (ref 28–170)
Saturation Ratios: 18 % (ref 10.4–31.8)
TIBC: 437 ug/dL (ref 250–450)
UIBC: 360 ug/dL (ref 148–442)

## 2022-09-15 LAB — FERRITIN: Ferritin: 33 ng/mL (ref 11–307)

## 2022-09-15 NOTE — Telephone Encounter (Signed)
1515 Palliative Care Note  Volunteer called pt yesterday 09/14/22 for palliative check in. "Pt stated she is doing well. No visits to [facility]. She is questioning who her [facility] nurse is now and says it used to be [place] before. She recently went to [facility] for colonoscopy. They were unable to find her DNR request. She said Glee Arvin was to have taken care of it. She also wants assistance in how to proceed with HCPOA. She is appreciative of the call an open to future calls."  Will let clinical team know of pt's request.  Barbette Merino, RN

## 2022-09-25 ENCOUNTER — Encounter: Payer: Self-pay | Admitting: Pulmonary Disease

## 2022-10-01 ENCOUNTER — Other Ambulatory Visit: Payer: Self-pay | Admitting: Family Medicine

## 2022-10-08 ENCOUNTER — Encounter: Payer: Self-pay | Admitting: Pulmonary Disease

## 2022-11-03 ENCOUNTER — Other Ambulatory Visit: Payer: Self-pay | Admitting: Adult Health

## 2022-11-03 DIAGNOSIS — G4701 Insomnia due to medical condition: Secondary | ICD-10-CM

## 2022-11-03 DIAGNOSIS — F411 Generalized anxiety disorder: Secondary | ICD-10-CM

## 2022-11-03 NOTE — Telephone Encounter (Signed)
Please call to schedule an appt, was due in May.

## 2022-11-04 NOTE — Telephone Encounter (Signed)
Sent MyChart message to schedule

## 2022-11-08 ENCOUNTER — Encounter: Payer: Self-pay | Admitting: Physician Assistant

## 2022-11-10 ENCOUNTER — Telehealth (INDEPENDENT_AMBULATORY_CARE_PROVIDER_SITE_OTHER): Payer: Self-pay | Admitting: Adult Health

## 2022-11-10 DIAGNOSIS — Z0389 Encounter for observation for other suspected diseases and conditions ruled out: Secondary | ICD-10-CM

## 2022-11-10 NOTE — Progress Notes (Deleted)
Christine Barton [id number] 03-07-1951 72 y.o.  Subjective:   Patient ID:  Christine Barton is a 72 y.o. (DOB 12-24-1950) female.  Chief Complaint: No chief complaint on file.   HPI Christine Barton presents to the office today for follow-up of GAD and insomnia.   Describes mood today as "ok". Pleasant. Mood symptoms - reports anxiety, depression and irritability. Reports increased panic attacks. Stating "I'm not doing too good". Reports increased situational stressors. Feels like Tranxene continues to be helpful  Would like to work with a Veterinary surgeon. Diagnosed with lung cancer. Stable interest and motivation. Taking medications as prescribed.  Energy levels lower. Active, does not have a regular exercise routine.  Enjoys some usual interests and activities. Lives alone with 7 cats. Lives next to daughter. Spending time with family.  Appetite decreased - pushing herself to eat - restarted ensure. Weight stable - 114 pounds. Sleeps well most nights. Averages 7 to 8 hours. Focus and concentration difficulties. Completing tasks. Managing aspects of household. Retired from [employer]. Denies SI or HI.  Denies AH or VH.   Mini-Mental    Flowsheet Row Clinical Support from 11/06/2018 in [facility] Clinical Support from 10/31/2017 in [facility]  Total Score (max 30 points ) 17 20      PHQ2-9    Flowsheet Row Office Visit from 02/23/2022 in [facility] [place] Telephone from 12/31/2021 in [facility] Office Visit from 09/28/2021 in [facility] Clinical Support from 11/07/2020 in [facility] Clinical Support from 11/07/2019 in [facility]  PHQ-2 Total Score 0 6 0 0 6  PHQ-9 Total Score 1 10 -- 0 6      Flowsheet Row Admission (Discharged) from  07/29/2022 in [facility] ENDOSCOPY ED from 07/27/2021 in [facility] at [facility]  C-SSRS RISK CATEGORY No Risk No Risk        Review of Systems:  Review of Systems  Medications: {medication reviewed/display:3041432}  Current Outpatient Medications  Medication Sig Dispense Refill   acetaminophen (TYLENOL) 500 MG tablet Take 500 mg by mouth every 6 (six) hours as needed for moderate pain.     albuterol (VENTOLIN HFA) 108 (90 Base) MCG/ACT inhaler INHALE 2 PUFFS INTO THE LUNGS EVERY 4 HOURS AS NEEDED FOR WHEEZE OR FOR SHORTNESS OF BREATH 8.5 each 3   cholecalciferol (VITAMIN D3) 25 MCG (1000 UNIT) tablet Take 1,000 Units by mouth 2 (two) times daily.     clorazepate (TRANXENE) 3.75 MG tablet Take 1 tablet (3.75 mg total) by mouth 2 (two) times daily as needed for anxiety. 60 tablet 2   cyclobenzaprine (FLEXERIL) 10 MG tablet TAKE 1/2 TABLET BY MOUTH AT BEDTIME AS NEEDED FOR MUSCLE SPASMS 15 tablet 3   furosemide (LASIX) 20 MG tablet TAKE 1 TABLET BY MOUTH EVERY DAY 90 tablet 1   hydrocortisone (ANUSOL-HC) 25 MG suppository Place 1 suppository (25 mg total) rectally every 12 (twelve) hours. 12 suppository 1   lactulose (CHRONULAC) 10 GM/15ML solution Take 15 mLs (10 g total) by mouth 3 (three) times daily. (Patient taking differently: Take 10 g by mouth See admin instructions. Take 10 g daily, may take a second 10 g dose as needed for constipation) 236 mL 11   metFORMIN (GLUCOPHAGE-XR) 500 MG 24 hr tablet TAKE 1 TABLET BY  MOUTH EVERY DAY WITH BREAKFAST 90 tablet 0   Multiple Vitamin (MULTIVITAMIN) tablet Take 1 tablet by mouth daily.     Multiple Vitamins-Minerals (PRESERVISION AREDS 2) CAPS Take 1 capsule by mouth 2 (two) times daily.     mupirocin ointment (BACTROBAN) 2 % Apply 1 Application topically 2 (two) times daily as needed (wound care/ skin infection). 15 g 1   nadolol (CORGARD) 20 MG tablet TAKE 1 TABLET BY MOUTH EVERY DAY 90 tablet 3    nystatin (MYCOSTATIN) 100000 UNIT/ML suspension TAKE 5 MLS (500,000 UNITS TOTAL) BY MOUTH 3 (THREE) TIMES DAILY. SWISH AND SWALLOW (Patient not taking: Reported on 07/26/2022) 120 mL 0   pantoprazole (PROTONIX) 40 MG tablet TAKE 1 TABLET (40 MG TOTAL) BY MOUTH TWICE A DAY BEFORE MEALS 180 tablet 1   SYSTANE COMPLETE 0.6 % SOLN Apply 1 drop to eye 2 (two) times daily.     Tiotropium Bromide Monohydrate (SPIRIVA RESPIMAT) 2.5 MCG/ACT AERS Inhale 2 each into the lungs daily.     ursodiol (ACTIGALL) 300 MG capsule TAKE 1 CAPSULE BY MOUTH EVERY DAY 90 capsule 1   XIFAXAN 550 MG TABS tablet TAKE 1 TABLET BY MOUTH 2 TIMES DAILY. 60 tablet 3   No current facility-administered medications for this visit.    Medication Side Effects: {Medication Side Effects (Optional):21014029}  Allergies:  Allergies  Allergen Reactions   Anoro Ellipta [Umeclidinium-Vilanterol] Cough   Bevespi Aerosphere [Glycopyrrolate-Formoterol] Cough    Prolonged coughing episode with first dose   Oxycodone Nausea And Vomiting   Crestor [Rosuvastatin]     Abdominal pain     Glipizide Other (See Comments)    Stomach pain   Prednisone     GI upset and general malaise     Past Medical History:  Diagnosis Date   Alcohol abuse, unspecified    Breast cancer (HCC) 1998   Right   Cataract    left eye   Cervical spondylosis 2006   MRI   Degeneration of cervical intervertebral disc 2006   MRI   Diabetes mellitus without complication (HCC)    Diverticulosis of colon (without mention of hemorrhage)    Hyperpotassemia    Lung cancer (HCC) 03/2021   started radiation in January 2023, right side   Microscopic hematuria    Mononeuritis of unspecified site    Nonspecific abnormal results of liver function study    Other abnormal glucose    Other and unspecified hyperlipidemia    no per pt   Other chronic nonalcoholic liver disease    Personal history of chemotherapy    Personal history of malignant neoplasm of breast     Personal history of radiation therapy    Pneumothorax, acute    right, spontaneous   Tobacco use disorder    Unspecified vitamin D deficiency     Past Medical History, Surgical history, Social history, and Family history were reviewed and updated as appropriate.   Please see review of systems for further details on the patient's review from today.   Objective:   Physical Exam:  LMP  (LMP Unknown)   Physical Exam  Lab Review:     Component Value Date/Time   NA 133 (L) 06/22/2022 1436   NA 132 (L) 05/26/2021 1147   K 4.3 06/22/2022 1436   CL 100 06/22/2022 1436   CO2 27 06/22/2022 1436   GLUCOSE 109 (H) 06/22/2022 1436   BUN 14 06/22/2022 1436   BUN 21 05/26/2021 1147   CREATININE 0.73 06/22/2022 1436  CALCIUM 10.1 06/22/2022 1436   PROT 7.4 06/22/2022 1436   PROT 7.1 05/26/2021 1147   ALBUMIN 4.3 06/22/2022 1436   ALBUMIN 4.4 05/26/2021 1147   AST 15 06/22/2022 1436   ALT 6 06/22/2022 1436   ALKPHOS 87 06/22/2022 1436   BILITOT 0.5 06/22/2022 1436   GFRNONAA >60 06/22/2022 1436   GFRAA 83 12/11/2019 1617       Component Value Date/Time   WBC 7.8 09/15/2022 1136   WBC 6.2 12/21/2021 1026   RBC 4.50 09/15/2022 1136   HGB 14.2 09/15/2022 1136   HGB 12.4 12/11/2019 1617   HCT 41.1 09/15/2022 1136   HCT 39.3 12/11/2019 1617   PLT 188 09/15/2022 1136   PLT 120 (L) 12/11/2019 1617   MCV 91.3 09/15/2022 1136   MCV 86 12/11/2019 1617   MCH 31.6 09/15/2022 1136   MCHC 34.5 09/15/2022 1136   RDW 15.7 (H) 09/15/2022 1136   RDW 17.0 (H) 12/11/2019 1617   LYMPHSABS 1.6 09/15/2022 1136   MONOABS 0.5 09/15/2022 1136   EOSABS 0.1 09/15/2022 1136   BASOSABS 0.0 09/15/2022 1136    No results found for: "POCLITH", "LITHIUM"   No results found for: "PHENYTOIN", "PHENOBARB", "VALPROATE", "CBMZ"   .res Assessment: Plan:    Plan:  PDMP reviewed  1. Tranxene 3.75 BID prn anxiety  RTC 6 months   Time spent with patient was 25 minutes. Greater than 50% of  face to face time with patient was spent on counseling and coordination of care.    Patient advised to contact office with any questions, adverse effects, or acute worsening in signs and symptoms.  Discussed potential benefits, risk, and side effects of benzodiazepines to include potential risk of tolerance and dependence, as well as possible drowsiness. Advised patient not to drive if experiencing drowsiness and to take lowest possible effective dose to minimize risk of dependence and tolerance.   There are no diagnoses linked to this encounter.   Please see After Visit Summary for patient specific instructions.  Future Appointments  Date Time Provider Department Center  11/10/2022  5:20 PM Michelyn Scullin, Thereasa Solo, NP CP-[facility] None  12/15/2022 11:15 AM [facility] None  12/20/2022 11:00 AM Salena Saner, MD [facility] None  12/20/2022  1:00 PM [facility]  12/23/2022  3:45 PM Si Gaul, MD [facility] None    No orders of the defined types were placed in this encounter.   -------------------------------

## 2022-11-10 NOTE — Progress Notes (Signed)
Patient no show appointment. ? ?

## 2022-11-15 NOTE — Telephone Encounter (Signed)
Pt

## 2022-11-15 NOTE — Telephone Encounter (Signed)
Pt LVM @ 10:04a for refill of Clorazepate to   [id number] Judithann Sheen, [place] - [address]nn M[place] [address] Phone: [phone]  Fax: [phone]   Next appt 8/12

## 2022-11-16 ENCOUNTER — Other Ambulatory Visit: Payer: Self-pay | Admitting: Gastroenterology

## 2022-11-19 ENCOUNTER — Telehealth (INDEPENDENT_AMBULATORY_CARE_PROVIDER_SITE_OTHER): Payer: Medicare HMO | Admitting: Adult Health

## 2022-11-19 ENCOUNTER — Encounter: Payer: Self-pay | Admitting: Adult Health

## 2022-11-19 DIAGNOSIS — G4700 Insomnia, unspecified: Secondary | ICD-10-CM

## 2022-11-19 DIAGNOSIS — G4701 Insomnia due to medical condition: Secondary | ICD-10-CM

## 2022-11-19 DIAGNOSIS — F411 Generalized anxiety disorder: Secondary | ICD-10-CM

## 2022-11-19 MED ORDER — CLORAZEPATE DIPOTASSIUM 3.75 MG PO TABS
3.7500 mg | ORAL_TABLET | Freq: Two times a day (BID) | ORAL | 2 refills | Status: DC | PRN
Start: 2022-11-19 — End: 2023-03-22

## 2022-11-19 NOTE — Progress Notes (Signed)
Christine Barton [id number] [birth year] 72 y.o.  Virtual Visit via Video Note  I connected with pt @ on 11/19/22 at 12:00 PM EDT by a video enabled telemedicine application and verified that I am speaking with the correct person using two identifiers.   I discussed the limitations of evaluation and management by telemedicine and the availability of in person appointments. The patient expressed understanding and agreed to proceed.  I discussed the assessment and treatment plan with the patient. The patient was provided an opportunity to ask questions and all were answered. The patient agreed with the plan and demonstrated an understanding of the instructions.   The patient was advised to call back or seek an in-person evaluation if the symptoms worsen or if the condition fails to improve as anticipated.  I provided 10 minutes of non-face-to-face time during this encounter.  The patient was located at home.  The provider was located at [facility]   Christine Gibbs, NP   Subjective:   Patient ID:  Christine Barton is a 72 y.o. (DOB 1950/12/19) female.  Chief Complaint: No chief complaint on file.   HPI Christine Barton presents for follow-up of GAD and insomnia.   Describes mood today as "ok". Pleasant. Denies tearfulness. Mood symptoms - denies depression and irritability. Feels anxious at times. Denies recent panic attacks. Mood is consistent. Stating "I feel like I'm doing ok". Feels like Tranxene continues to work well for her as needed. Stable interest and motivation. Taking medications as prescribed. Energy levels lower. Active, does not have a regular exercise routine.  Enjoys some usual interests and activities. Lives alone with 6 cats. Lives next to daughter. Spending time with family.  Appetite decreased - "fighting to eat". Weight stable - 113 pounds. Sleeps well most nights. Averages 7 to 8 hours. Focus and concentration stable. Completing tasks. Managing aspects of  household. Retired from [employer]. Denies SI or HI.  Denies AH or VH. Denies self harm. Denies substance use.   Review of Systems:  Review of Systems  Musculoskeletal:  Negative for gait problem.  Neurological:  Negative for tremors.  Psychiatric/Behavioral:         Please refer to HPI    Medications: I have reviewed the patient's current medications.  Current Outpatient Medications  Medication Sig Dispense Refill   acetaminophen (TYLENOL) 500 MG tablet Take 500 mg by mouth every 6 (six) hours as needed for moderate pain.     albuterol (VENTOLIN HFA) 108 (90 Base) MCG/ACT inhaler INHALE 2 PUFFS INTO THE LUNGS EVERY 4 HOURS AS NEEDED FOR WHEEZE OR FOR SHORTNESS OF BREATH 8.5 each 3   cholecalciferol (VITAMIN D3) 25 MCG (1000 UNIT) tablet Take 1,000 Units by mouth 2 (two) times daily.     clorazepate (TRANXENE) 3.75 MG tablet TAKE 1 TABLET (3.75 MG TOTAL) BY MOUTH 2 (TWO) TIMES DAILY AS NEEDED FOR ANXIETY. 60 tablet 0   cyclobenzaprine (FLEXERIL) 10 MG tablet TAKE 1/2 TABLET BY MOUTH AT BEDTIME AS NEEDED FOR MUSCLE SPASMS 15 tablet 3   furosemide (LASIX) 20 MG tablet TAKE 1 TABLET BY MOUTH EVERY DAY 90 tablet 1   hydrocortisone (ANUSOL-HC) 25 MG suppository Place 1 suppository (25 mg total) rectally every 12 (twelve) hours. 12 suppository 1   lactulose (CHRONULAC) 10 GM/15ML solution Take 15 mLs (10 g total) by mouth 3 (three) times daily. (Patient taking differently: Take 10 g by mouth See admin instructions. Take 10 g daily, may take a second 10 g dose  as needed for constipation) 236 mL 11   metFORMIN (GLUCOPHAGE-XR) 500 MG 24 hr tablet TAKE 1 TABLET BY MOUTH EVERY DAY WITH BREAKFAST 90 tablet 0   Multiple Vitamin (MULTIVITAMIN) tablet Take 1 tablet by mouth daily.     Multiple Vitamins-Minerals (PRESERVISION AREDS 2) CAPS Take 1 capsule by mouth 2 (two) times daily.     mupirocin ointment (BACTROBAN) 2 % Apply 1 Application topically 2 (two) times daily as needed (wound  care/ skin infection). 15 g 1   nadolol (CORGARD) 20 MG tablet TAKE 1 TABLET BY MOUTH EVERY DAY 90 tablet 3   nystatin (MYCOSTATIN) 100000 UNIT/ML suspension TAKE 5 MLS (500,000 UNITS TOTAL) BY MOUTH 3 (THREE) TIMES DAILY. SWISH AND SWALLOW (Patient not taking: Reported on 07/26/2022) 120 mL 0   pantoprazole (PROTONIX) 40 MG tablet TAKE 1 TABLET (40 MG TOTAL) BY MOUTH TWICE A DAY BEFORE MEALS 180 tablet 1   SYSTANE COMPLETE 0.6 % SOLN Apply 1 drop to eye 2 (two) times daily.     Tiotropium Bromide Monohydrate (SPIRIVA RESPIMAT) 2.5 MCG/ACT AERS Inhale 2 each into the lungs daily.     ursodiol (ACTIGALL) 300 MG capsule TAKE 1 CAPSULE BY MOUTH EVERY DAY 90 capsule 1   XIFAXAN 550 MG TABS tablet TAKE 1 TABLET BY MOUTH 2 TIMES DAILY. 60 tablet 3   No current facility-administered medications for this visit.    Medication Side Effects: None  Allergies:  Allergies  Allergen Reactions   Anoro Ellipta [Umeclidinium-Vilanterol] Cough   Bevespi Aerosphere [Glycopyrrolate-Formoterol] Cough    Prolonged coughing episode with first dose   Oxycodone Nausea And Vomiting   Crestor [Rosuvastatin]     Abdominal pain     Glipizide Other (See Comments)    Stomach pain   Prednisone     GI upset and general malaise     Past Medical History:  Diagnosis Date   Alcohol abuse, unspecified    Breast cancer (HCC) 1998   Right   Cataract    left eye   Cervical spondylosis 2006   MRI   Degeneration of cervical intervertebral disc 2006   MRI   Diabetes mellitus without complication (HCC)    Diverticulosis of colon (without mention of hemorrhage)    Hyperpotassemia    Lung cancer (HCC) 03/2021   started radiation in January 2023, right side   Microscopic hematuria    Mononeuritis of unspecified site    Nonspecific abnormal results of liver function study    Other abnormal glucose    Other and unspecified hyperlipidemia    no per pt   Other chronic nonalcoholic liver disease    Personal history of  chemotherapy    Personal history of malignant neoplasm of breast    Personal history of radiation therapy    Pneumothorax, acute    right, spontaneous   Tobacco use disorder    Unspecified vitamin D deficiency     Family History  Problem Relation Age of Onset   Heart failure Father    Heart attack Father    Colon cancer Maternal Uncle    Stroke Mother    Esophageal cancer Neg Hx    Rectal cancer Neg Hx    Stomach cancer Neg Hx    Pancreatic cancer Neg Hx     Social History   Socioeconomic History   Marital status: Single    Spouse name: Not on file   Number of children: 1   Years of education: Not on file  Highest education level: Not on file  Occupational History   Occupation: retired    Associate Professor: [employer]  Tobacco Use   Smoking status: Every Day    Current packs/day: 1.00    Average packs/day: 1 pack/day for 58.0 years (58.0 ttl pk-yrs)    Types: Cigarettes    Start date: 09/25/2021    Passive exposure: Past   Smokeless tobacco: Never   Tobacco comments:        1PPD - 03/12/2022  Vaping Use   Vaping status: Never Used  Substance and Sexual Activity   Alcohol use: Not Currently   Drug use: No   Sexual activity: Not Currently  Other Topics Concern   Not on file  Social History Narrative   Divorced      1 child      Works at [employer]         Social Determinants of Health   Financial Resource Strain: Low Risk  (11/07/2020)   Overall Financial Resource Strain (CARDIA)    Difficulty of Paying Living Expenses: Not hard at all  Food Insecurity: No Food Insecurity (12/31/2021)   Hunger Vital Sign    Worried About Running Out of Food in the Last Year: Never true    Ran Out of Food in the Last Year: Never true  Transportation Needs: No Transportation Needs (12/31/2021)   PRAPARE - Administrator, Civil Service (Medical): No    Lack of Transportation (Non-Medical): No  Physical Activity: Inactive (11/07/2020)   Exercise Vital Sign     Days of Exercise per Week: 0 days    Minutes of Exercise per Session: 0 min  Stress: No Stress Concern Present (11/07/2020)   [employer] of Occupational Health - Occupational Stress Questionnaire    Feeling of Stress : Not at all  Social Connections: Not on file  Intimate Partner Violence: Not At Risk (11/07/2020)   Humiliation, Afraid, Rape, and Kick questionnaire    Fear of Current or Ex-Partner: No    Emotionally Abused: No    Physically Abused: No    Sexually Abused: No    Past Medical History, Surgical history, Social history, and Family history were reviewed and updated as appropriate.   Please see review of systems for further details on the patient's review from today.   Objective:   Physical Exam:  LMP  (LMP Unknown)   Physical Exam Constitutional:      General: She is not in acute distress. Musculoskeletal:        General: No deformity.  Neurological:     Mental Status: She is alert and oriented to person, place, and time.     Coordination: Coordination normal.  Psychiatric:        Attention and Perception: Attention and perception normal. She does not perceive auditory or visual hallucinations.        Mood and Affect: Affect is not labile, blunt, angry or inappropriate.        Speech: Speech normal.        Behavior: Behavior normal.        Thought Content: Thought content normal. Thought content is not paranoid or delusional. Thought content does not include homicidal or suicidal ideation. Thought content does not include homicidal or suicidal plan.        Cognition and Memory: Cognition and memory normal.        Judgment: Judgment normal.     Comments: Insight intact     Lab Review:  Component Value Date/Time   NA 133 (L) 06/22/2022 1436   NA 132 (L) 05/26/2021 1147   K 4.3 06/22/2022 1436   CL 100 06/22/2022 1436   CO2 27 06/22/2022 1436   GLUCOSE 109 (H) 06/22/2022 1436   BUN 14 06/22/2022 1436   BUN 21 05/26/2021 1147   CREATININE 0.73  06/22/2022 1436   CALCIUM 10.1 06/22/2022 1436   PROT 7.4 06/22/2022 1436   PROT 7.1 05/26/2021 1147   ALBUMIN 4.3 06/22/2022 1436   ALBUMIN 4.4 05/26/2021 1147   AST 15 06/22/2022 1436   ALT 6 06/22/2022 1436   ALKPHOS 87 06/22/2022 1436   BILITOT 0.5 06/22/2022 1436   GFRNONAA >60 06/22/2022 1436   GFRAA 83 12/11/2019 1617       Component Value Date/Time   WBC 7.8 09/15/2022 1136   WBC 6.2 12/21/2021 1026   RBC 4.50 09/15/2022 1136   HGB 14.2 09/15/2022 1136   HGB 12.4 12/11/2019 1617   HCT 41.1 09/15/2022 1136   HCT 39.3 12/11/2019 1617   PLT 188 09/15/2022 1136   PLT 120 (L) 12/11/2019 1617   MCV 91.3 09/15/2022 1136   MCV 86 12/11/2019 1617   MCH 31.6 09/15/2022 1136   MCHC 34.5 09/15/2022 1136   RDW 15.7 (H) 09/15/2022 1136   RDW 17.0 (H) 12/11/2019 1617   LYMPHSABS 1.6 09/15/2022 1136   MONOABS 0.5 09/15/2022 1136   EOSABS 0.1 09/15/2022 1136   BASOSABS 0.0 09/15/2022 1136    No results found for: "POCLITH", "LITHIUM"   No results found for: "PHENYTOIN", "PHENOBARB", "VALPROATE", "CBMZ"   .res Assessment: Plan:    Plan:  PDMP reviewed  1. Tranxene 3.75 BID prn anxiety  RTC 6 months   Time spent with patient was 25 minutes. Greater than 50% of face to face time with patient was spent on counseling and coordination of care.    Patient advised to contact office with any questions, adverse effects, or acute worsening in signs and symptoms.  Discussed potential benefits, risk, and side effects of benzodiazepines to include potential risk of tolerance and dependence, as well as possible drowsiness. Advised patient not to drive if experiencing drowsiness and to take lowest possible effective dose to minimize risk of dependence and tolerance.   There are no diagnoses linked to this encounter.   Please see After Visit Summary for patient specific instructions.  Future Appointments  Date Time Provider Department Center  11/19/2022 12:00 PM , Thereasa Solo, NP CP-CP None  12/15/2022 11:15 AM [facility] None  12/20/2022 11:00 AM Salena Saner, MD [facility] None  12/20/2022  1:00 PM [facility]  12/23/2022  3:45 PM Si Gaul, MD [facility] None    No orders of the defined types were placed in this encounter.     -------------------------------

## 2022-12-04 IMAGING — MR MR ABDOMEN WO/W CM
11 of 18 series · 27 of 48 positions shown · IV contrast (9 ml multihance)
Comparison: 12/31/2019 MRI abdomen.  04/30/2020 abdominal sonogram.

CLINICAL DATA: Cirrhosis. History of breast cancer. Abnormal liver
function tests.

EXAM:
MRI ABDOMEN WITHOUT AND WITH CONTRAST
TECHNIQUE: Multiplanar multisequence MR imaging of the abdomen was performed
both before and after the administration of intravenous contrast.
CONTRAST:  9mL MULTIHANCE GADOBENATE DIMEGLUMINE 529 MG/ML IV SOLN

[Series 3: cor haste · coronal · 5.0mm · 0.68mm/px · 2 of 32 slices shown]
[im 1/32]
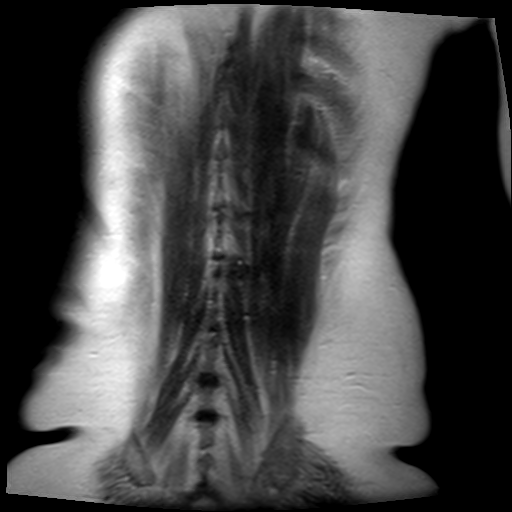
[im 32/32]
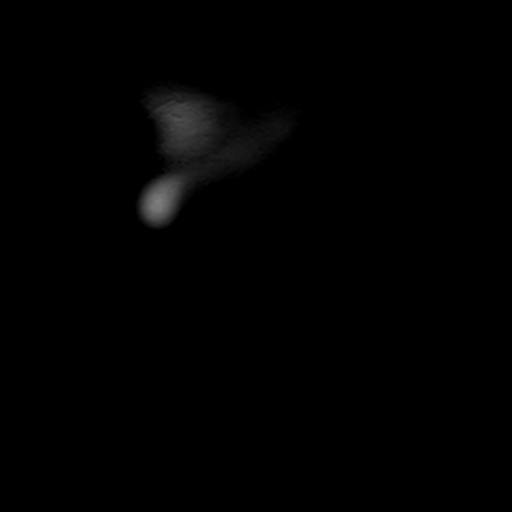

[Series 4: axial haste · axial · 6.0mm · 0.68mm/px · z∈[-33,+158]mm · 2 of 30 slices shown]
[im 1/30]
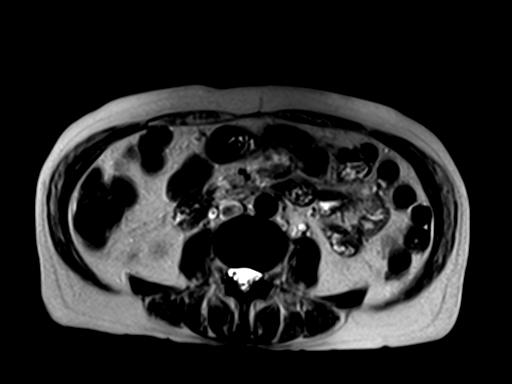
[im 30/30]
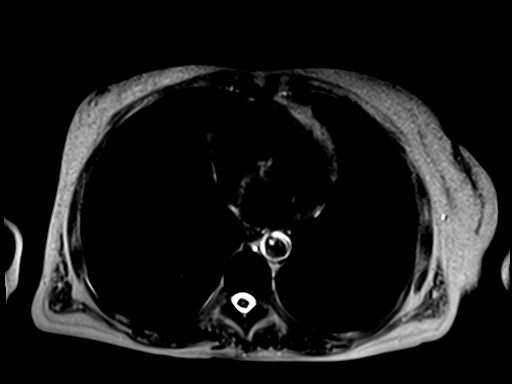

[Series 5: T1 · axial · 6.0mm · 0.68mm/px · z∈[-43,+168]mm · 4 of 66 slices shown]
[im 1/66]
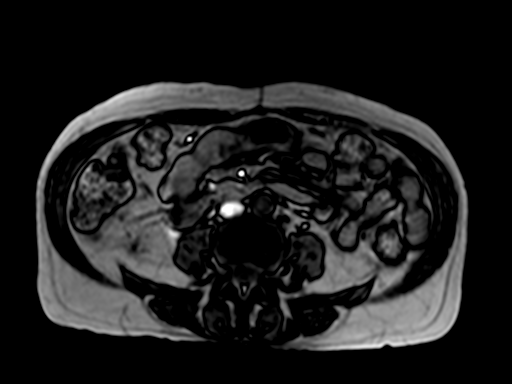
[im 22/66]
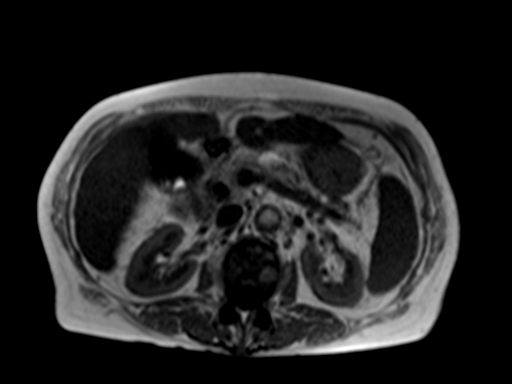
[im 44/66]
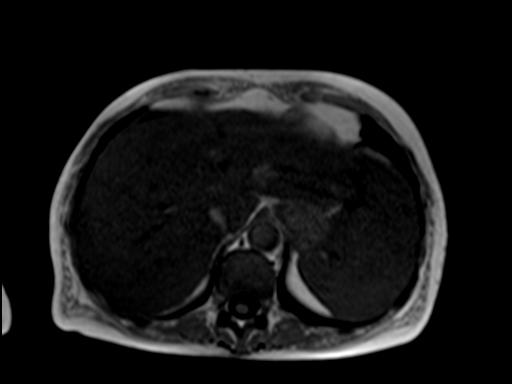
[im 66/66]
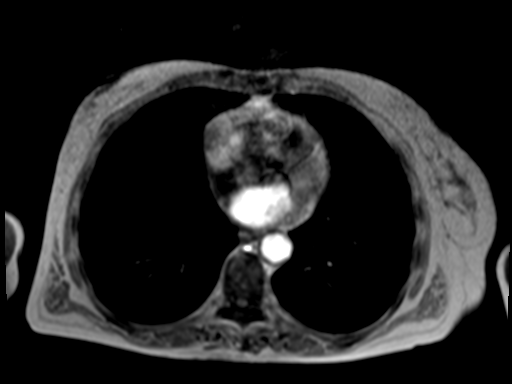

[Series 6: bSSFP · axial · 4.0mm · 0.68mm/px · z∈[-57,+7]mm · 2 of 17 slices shown (1 of 2)]
[im 1/17]
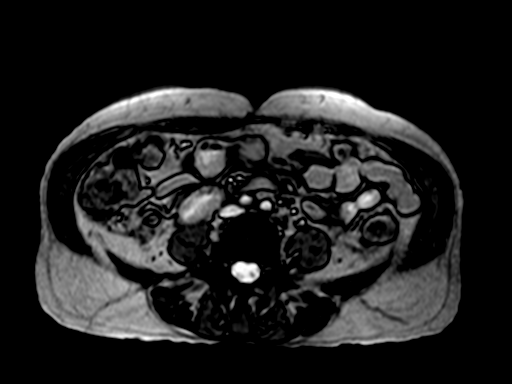
[im 17/17]
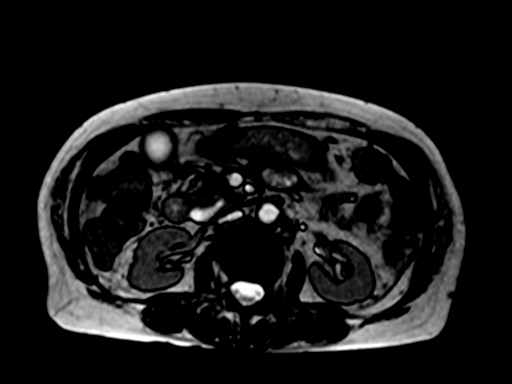

[Series 7: bSSFP · axial · 4.0mm · 0.68mm/px · z∈[-57,+182]mm · 2 of 61 slices shown (2 of 2)]
[im 1/61]
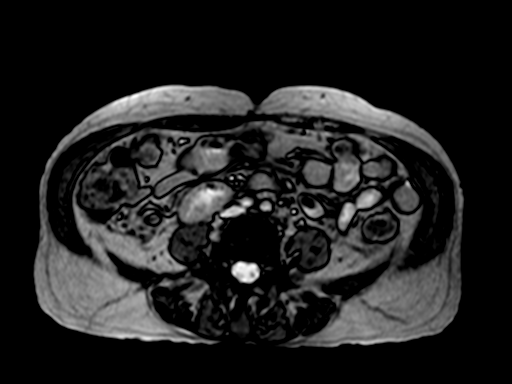
[im 61/61]
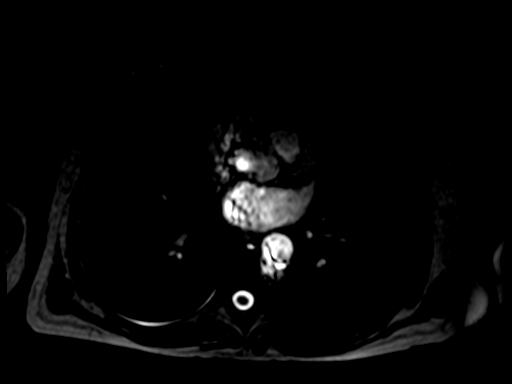

[Series 8: T2 fat-sat · axial · 6.0mm · 1.09mm/px · 1 of 25 slices shown]
[im 1/25]
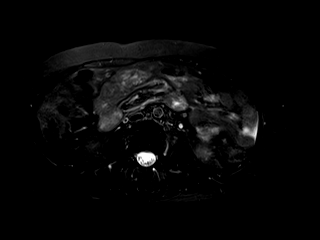

[Series 9: ep2d_diff_b50_500_800_p2_trig · axial · 6.0mm · 1.82mm/px · z∈[-54,+169]mm · 4 of 96 slices shown]
[im 1/96]
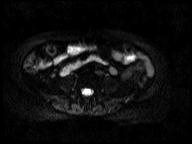
[im 32/96]
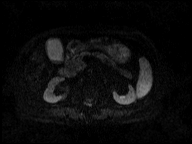
[im 64/96]
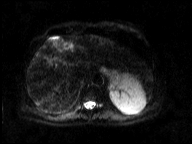
[im 96/96]
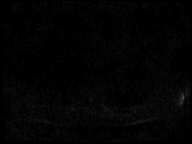

[Series 10: ep2d_diff_b50_500_800_p2_trig_adc · axial · 6.0mm · 1.82mm/px · 1 of 32 slices shown]
[im 1/32]
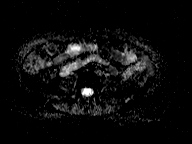

[Series 11: T1 dynamic · axial · non-contrast · 2.5mm · 0.74mm/px · z∈[-29,+188]mm · 3 of 88 slices shown]
[im 1/88]
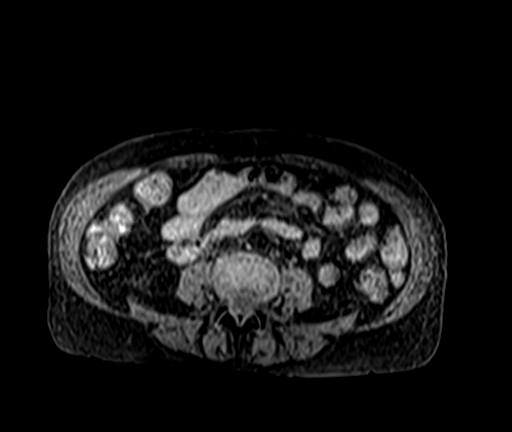
[im 44/88]
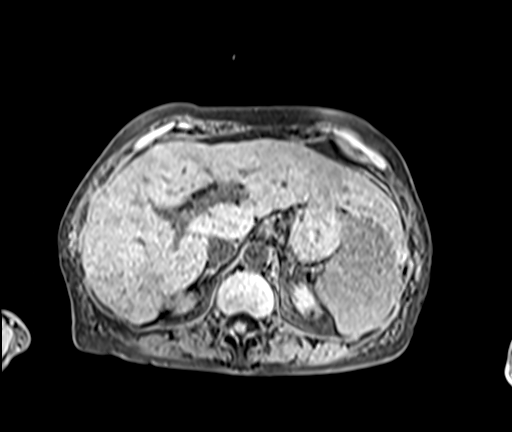
[im 88/88]
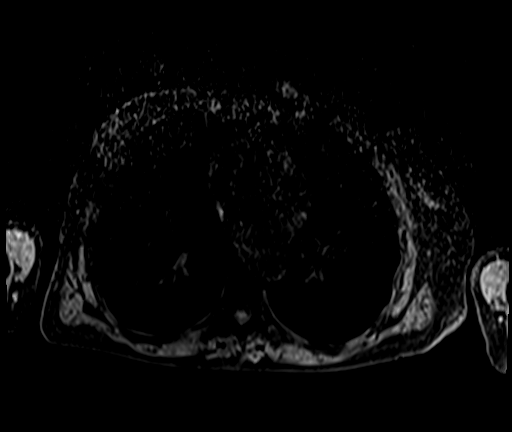

[Series 12: T1 dynamic post-contrast · axial · 2.5mm · 0.74mm/px · z∈[-29,+188]mm · 3 of 88 slices shown (1 of 2)]
[im 1/88]
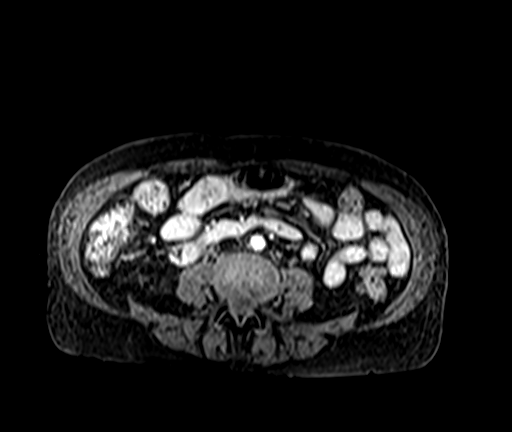
[im 44/88]
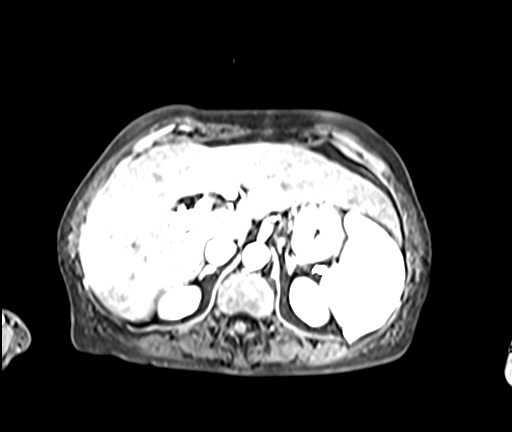
[im 88/88]
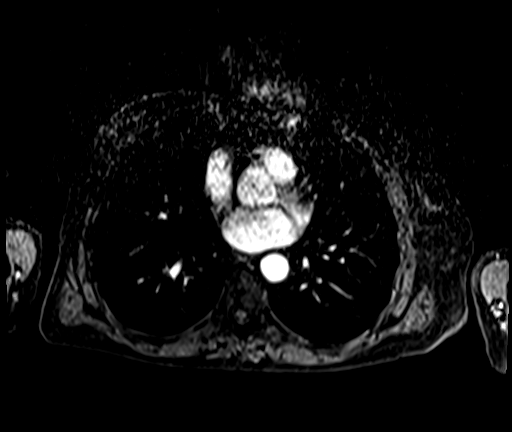

[Series 13: T1 dynamic post-contrast · axial · 2.5mm · 0.74mm/px · z∈[-29,+188]mm · 3 of 88 slices shown (2 of 2)]
[im 1/88]
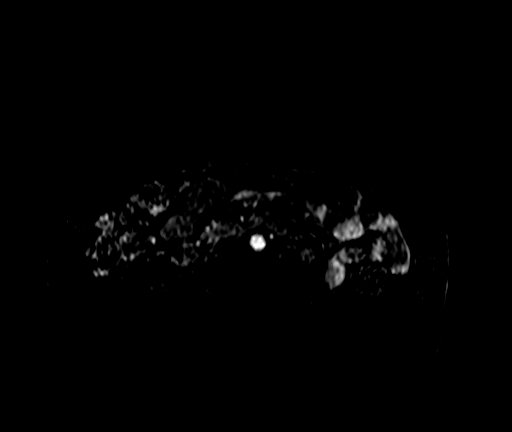
[im 44/88]
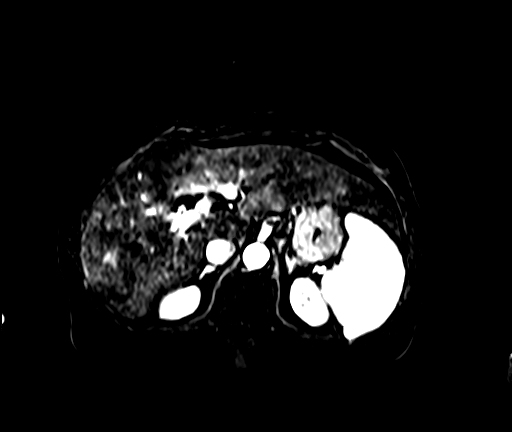
[im 88/88]
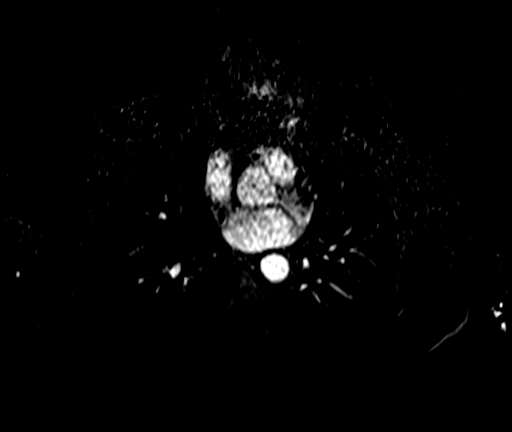

[27 of 48 positions shown; findings below may reference images not displayed]

FINDINGS: Lower chest: No acute abnormality at the lung bases.

Hepatobiliary: Diffusely lobulated liver surface compatible with
cirrhosis. Patchy moderate lace-like T2 hyperintensity predominantly
in anterior superior right liver compatible with confluent hepatic
fibrosis. Mild diffuse hepatic hemosiderosis. No liver mass. Several
layering gallstones in the gallbladder, largest 10 mm. No
gallbladder wall thickening. No pericholecystic fluid. Chronic mild
central intrahepatic biliary ductal dilatation and dilated common
bile duct, unchanged. Common bile duct diameter 9 mm. No evidence of
choledocholithiasis. No biliary masses, strictures or beading.

Pancreas: No pancreatic mass. Chronic dilation of the dorsal
pancreatic duct in the pancreatic head (5 mm diameter), unchanged.
Otherwise normal caliber main pancreatic duct. No pancreas divisum.

Spleen: Normal size. No mass.

Adrenals/Urinary Tract: Normal adrenals. No hydronephrosis. Tiny sub
5 mm simple renal cortical cysts scattered in the upper kidneys
bilaterally. No suspicious renal masses.

Stomach/Bowel: Normal non-distended stomach. Visualized small and
large bowel is normal caliber, with no bowel wall thickening.

Vascular/Lymphatic: Atherosclerotic nonaneurysmal abdominal aorta.
Patent portal, splenic, hepatic and renal veins. No pathologically
enlarged lymph nodes in the abdomen.

Other: Trace perihepatic ascites.  No focal fluid collections.

Musculoskeletal: No aggressive appearing focal osseous lesions.
IMPRESSION: 1. Cirrhosis. No liver masses.
2. Trace perihepatic ascites.  Normal size spleen.
3. Cholelithiasis. Chronic mild central intrahepatic and
extrahepatic biliary ductal dilatation, unchanged. CBD diameter 9
mm. No evidence of choledocholithiasis.
4. Chronic mild dilation of the dorsal pancreatic duct in the
pancreatic head, unchanged, nonspecific. No pancreatic mass.

## 2022-12-06 ENCOUNTER — Encounter: Payer: Self-pay | Admitting: Family Medicine

## 2022-12-06 ENCOUNTER — Ambulatory Visit (INDEPENDENT_AMBULATORY_CARE_PROVIDER_SITE_OTHER): Payer: Medicare HMO | Admitting: Family Medicine

## 2022-12-06 ENCOUNTER — Other Ambulatory Visit: Payer: Self-pay | Admitting: Family Medicine

## 2022-12-06 VITALS — BP 108/68 | HR 78 | Temp 97.8°F | Ht 64.0 in | Wt 111.4 lb

## 2022-12-06 DIAGNOSIS — Z7984 Long term (current) use of oral hypoglycemic drugs: Secondary | ICD-10-CM | POA: Diagnosis not present

## 2022-12-06 DIAGNOSIS — B009 Herpesviral infection, unspecified: Secondary | ICD-10-CM

## 2022-12-06 DIAGNOSIS — E119 Type 2 diabetes mellitus without complications: Secondary | ICD-10-CM | POA: Diagnosis not present

## 2022-12-06 DIAGNOSIS — Z23 Encounter for immunization: Secondary | ICD-10-CM | POA: Diagnosis not present

## 2022-12-06 MED ORDER — VALACYCLOVIR HCL 500 MG PO TABS
500.0000 mg | ORAL_TABLET | Freq: Two times a day (BID) | ORAL | 0 refills | Status: DC
Start: 2022-12-06 — End: 2022-12-29

## 2022-12-06 NOTE — Assessment & Plan Note (Signed)
Sore area on right buttock with healing vesicles  Mildly tender  Resembles healing HSV outbreak   Discussed cleaning with soap and water  Offload pressure with sitting  Valtrex 500 mg bid for 3 d Update if not starting to improve in a week or if worsening  Call back and Er precautions noted in detail today

## 2022-12-06 NOTE — Patient Instructions (Addendum)
Keep the painful area clean with soap and water Take valtrex as directed  If no further improvement let us know   Flu shot today   Plan your shingles shot in 2 or more weeks   Schedule annual exam after mid September with fasting labs prior

## 2022-12-06 NOTE — Assessment & Plan Note (Signed)
Due for follow up  Will schedule annual exam next month Takes metformin XR 500 mg daily

## 2022-12-06 NOTE — Progress Notes (Signed)
Subjective:    Patient ID: Christine Barton, female    DOB: 21-Jan-1951, 72 y.o.   MRN: [id number]  HPI  Wt Readings from Last 3 Encounters:  12/06/22 111 lb 6 oz (50.5 kg)  07/29/22 113 lb (51.3 kg)  06/23/22 115 lb 4 oz (52.3 kg)   19.12 kg/m  Vitals:   12/06/22 0956  BP: 108/68  Pulse: 78  Temp: 97.8 F (36.6 C)  SpO2: 98%   Pt presents for sore on back side (after sitting a lot)  Also for flu shot   Sore Wonders if it started with HSV  2 weeks  Is not a picker  Is improved but not getting better  Using bactroban   Quite sore  Unable to sit comfortably for a while/ that is improved   Pain does not refer    Due for 2nd shingrix vaccine       Patient Active Problem List   Diagnosis Date Noted   Osteopenia 06/17/2022   Cat bite of upper arm, left, initial encounter 12/23/2021   Pulmonary emphysema (HCC) 09/03/2021   Shortness of breath 09/03/2021   Agatston CAC score, >400 07/02/2021   Left-sided chest wall pain 06/04/2021   Lung cancer (HCC) 02/25/2021   Iron deficiency anemia 01/01/2021   Iron deficiency anemia due to chronic blood loss 11/06/2020   AVM (arteriovenous malformation) of colon    Benign neoplasm of colon    NASH (nonalcoholic steatohepatitis)    Malnutrition (HCC) 07/25/2019   HSV infection 02/23/2019   Aortic atherosclerosis (HCC) 01/22/2019   Coronary atherosclerosis 01/22/2019   Encounter for screening for lung cancer 01/04/2019   Venous insufficiency 11/15/2018   Esophageal varices without bleeding (HCC) 10/27/2018   Anemia 10/23/2018   Marasmus (HCC) 07/26/2018   Poor balance 05/30/2018   Cirrhosis of liver (HCC) 04/24/2018   Gallstones 04/24/2018   Heme positive stool 04/24/2018   Screening mammogram, encounter for 11/07/2017   Smoker 11/07/2017   Screening examination for STD (sexually transmitted disease) 11/11/2016   Welcome to Medicare preventive visit 10/26/2016   Estrogen deficiency 10/26/2016   Colon cancer  screening 11/01/2014   Encounter for routine gynecological examination 09/12/2013   Left shoulder pain 08/20/2011   Routine general medical examination at a health care facility 07/21/2011   Vitamin D deficiency 08/04/2009   POSTMENOPAUSAL STATUS 08/04/2009   Diabetes mellitus treated with oral medication (HCC) 02/18/2009   Hyperlipidemia associated with type 2 diabetes mellitus (HCC) 06/14/2008   DISC DISEASE, CERVICAL 03/30/2007   History of alcohol abuse 12/06/2006   Neuropathy of both feet 12/06/2006   DIVERTICULOSIS, COLON 12/06/2006   Fatty liver 12/06/2006   BREAST CANCER, HX OF 12/06/2006   Past Medical History:  Diagnosis Date   Alcohol abuse, unspecified    Breast cancer (HCC) 1998   Right   Cataract    left eye   Cervical spondylosis 2006   MRI   Degeneration of cervical intervertebral disc 2006   MRI   Diabetes mellitus without complication (HCC)    Diverticulosis of colon (without mention of hemorrhage)    Hyperpotassemia    Lung cancer (HCC) 03/2021   started radiation in January 2023, right side   Microscopic hematuria    Mononeuritis of unspecified site    Nonspecific abnormal results of liver function study    Other abnormal glucose    Other and unspecified hyperlipidemia    no per pt   Other chronic nonalcoholic liver disease    Personal  history of chemotherapy    Personal history of malignant neoplasm of breast    Personal history of radiation therapy    Pneumothorax, acute    right, spontaneous   Tobacco use disorder    Unspecified vitamin D deficiency    Past Surgical History:  Procedure Laterality Date   BIOPSY  07/27/2019   Procedure: BIOPSY;  Surgeon: Benancio Deeds, MD;  Location: [facility];  Service: Gastroenterology;;   BREAST BIOPSY  9/03   Right   BREAST LUMPECTOMY Right 1998   CHEST TUBE INSERTION  11/02/2014   COLONOSCOPY     COLONOSCOPY WITH PROPOFOL N/A 07/27/2019   Procedure: COLONOSCOPY WITH PROPOFOL;  Surgeon:  Benancio Deeds, MD;  Location: [facility];  Service: Gastroenterology;  Laterality: N/A;   COLONOSCOPY WITH PROPOFOL N/A 07/29/2022   Procedure: COLONOSCOPY WITH PROPOFOL;  Surgeon: Napoleon Form, MD;  Location: [facility];  Service: Gastroenterology;  Laterality: N/A;   ESOPHAGOGASTRODUODENOSCOPY (EGD) WITH PROPOFOL N/A 10/30/2018   Procedure: ESOPHAGOGASTRODUODENOSCOPY (EGD) WITH PROPOFOL;  Surgeon: Napoleon Form, MD;  Location: [facility];  Service: Endoscopy;  Laterality: N/A;   ESOPHAGOGASTRODUODENOSCOPY (EGD) WITH PROPOFOL N/A 03/12/2019   Procedure: ESOPHAGOGASTRODUODENOSCOPY (EGD) WITH PROPOFOL;  Surgeon: Napoleon Form, MD;  Location: [facility];  Service: Endoscopy;  Laterality: N/A;   ESOPHAGOGASTRODUODENOSCOPY (EGD) WITH PROPOFOL N/A 07/27/2019   Procedure: ESOPHAGOGASTRODUODENOSCOPY (EGD) WITH PROPOFOL;  Surgeon: Benancio Deeds, MD;  Location: [facility];  Service: Gastroenterology;  Laterality: N/A;   EYE SURGERY  02/2017   cataract extraction with lens implant-left   HEMOSTASIS CLIP PLACEMENT  07/29/2022   Procedure: HEMOSTASIS CLIP PLACEMENT;  Surgeon: Napoleon Form, MD;  Location: [facility];  Service: Gastroenterology;;   HOT HEMOSTASIS N/A 07/27/2019   Procedure: HOT HEMOSTASIS (ARGON PLASMA COAGULATION/BICAP);  Surgeon: Benancio Deeds, MD;  Location: [facility];  Service: Gastroenterology;  Laterality: N/A;   MOUTH SURGERY     POLYPECTOMY  07/27/2019   Procedure: POLYPECTOMY;  Surgeon: Benancio Deeds, MD;  Location: [facility];  Service: Gastroenterology;;   POLYPECTOMY  07/29/2022   Procedure: POLYPECTOMY;  Surgeon: Napoleon Form, MD;  Location: [facility];  Service: Gastroenterology;;   TUBAL LIGATION     Social History   Tobacco Use   Smoking status: Every Day    Current packs/day: 1.00    Average packs/day: 1 pack/day for 58.1 years (58.1 ttl pk-yrs)    Types: Cigarettes    Start date: 09/25/2021     Passive exposure: Past   Smokeless tobacco: Never   Tobacco comments:        1PPD - 03/12/2022  Vaping Use   Vaping status: Never Used  Substance Use Topics   Alcohol use: Not Currently   Drug use: No   Family History  Problem Relation Age of Onset   Heart failure Father    Heart attack Father    Colon cancer Maternal Uncle    Stroke Mother    Esophageal cancer Neg Hx    Rectal cancer Neg Hx    Stomach cancer Neg Hx    Pancreatic cancer Neg Hx    Allergies  Allergen Reactions   Anoro Ellipta [Umeclidinium-Vilanterol] Cough   Bevespi Aerosphere [Glycopyrrolate-Formoterol] Cough    Prolonged coughing episode with first dose   Oxycodone Nausea And Vomiting   Crestor [Rosuvastatin]     Abdominal pain     Glipizide Other (See Comments)    Stomach pain   Prednisone     GI upset  and general malaise    Current Outpatient Medications on File Prior to Visit  Medication Sig Dispense Refill   acetaminophen (TYLENOL) 500 MG tablet Take 500 mg by mouth every 6 (six) hours as needed for moderate pain.     albuterol (VENTOLIN HFA) 108 (90 Base) MCG/ACT inhaler INHALE 2 PUFFS INTO THE LUNGS EVERY 4 HOURS AS NEEDED FOR WHEEZE OR FOR SHORTNESS OF BREATH 8.5 each 3   cholecalciferol (VITAMIN D3) 25 MCG (1000 UNIT) tablet Take 1,000 Units by mouth 2 (two) times daily.     clorazepate (TRANXENE) 3.75 MG tablet Take 1 tablet (3.75 mg total) by mouth 2 (two) times daily as needed for anxiety. 60 tablet 2   cyclobenzaprine (FLEXERIL) 10 MG tablet TAKE 1/2 TABLET BY MOUTH AT BEDTIME AS NEEDED FOR MUSCLE SPASMS 15 tablet 3   furosemide (LASIX) 20 MG tablet TAKE 1 TABLET BY MOUTH EVERY DAY 90 tablet 1   lactulose (CHRONULAC) 10 GM/15ML solution Take 15 mLs (10 g total) by mouth 3 (three) times daily. (Patient taking differently: Take 10 g by mouth See admin instructions. Take 10 g daily, may take a second 10 g dose as needed for constipation) 236 mL 11   metFORMIN (GLUCOPHAGE-XR) 500 MG 24 hr tablet  TAKE 1 TABLET BY MOUTH EVERY DAY WITH BREAKFAST 90 tablet 0   Multiple Vitamin (MULTIVITAMIN) tablet Take 1 tablet by mouth daily.     Multiple Vitamins-Minerals (PRESERVISION AREDS 2) CAPS Take 1 capsule by mouth 2 (two) times daily.     mupirocin ointment (BACTROBAN) 2 % Apply 1 Application topically 2 (two) times daily as needed (wound care/ skin infection). 15 g 1   nadolol (CORGARD) 20 MG tablet TAKE 1 TABLET BY MOUTH EVERY DAY 90 tablet 3   nystatin (MYCOSTATIN) 100000 UNIT/ML suspension TAKE 5 MLS (500,000 UNITS TOTAL) BY MOUTH 3 (THREE) TIMES DAILY. SWISH AND SWALLOW 120 mL 0   pantoprazole (PROTONIX) 40 MG tablet TAKE 1 TABLET (40 MG TOTAL) BY MOUTH TWICE A DAY BEFORE MEALS 180 tablet 1   SYSTANE COMPLETE 0.6 % SOLN Apply 1 drop to eye 2 (two) times daily.     Tiotropium Bromide Monohydrate (SPIRIVA RESPIMAT) 2.5 MCG/ACT AERS Inhale 2 each into the lungs daily.     ursodiol (ACTIGALL) 300 MG capsule TAKE 1 CAPSULE BY MOUTH EVERY DAY 90 capsule 1   XIFAXAN 550 MG TABS tablet TAKE 1 TABLET BY MOUTH 2 TIMES DAILY. 60 tablet 3   No current facility-administered medications on file prior to visit.    Review of Systems     Objective:   Physical Exam Constitutional:      General: She is not in acute distress.    Appearance: Normal appearance. She is not ill-appearing.     Comments: Slim Frail appearing   HENT:     Mouth/Throat:     Mouth: Mucous membranes are moist.  Eyes:     General:        Right eye: No discharge.        Left eye: No discharge.     Extraocular Movements: Extraocular movements intact.     Pupils: Pupils are equal, round, and reactive to light.  Cardiovascular:     Rate and Rhythm: Normal rate.     Heart sounds: Normal heart sounds.  Pulmonary:     Effort: Pulmonary effort is normal. No respiratory distress.     Breath sounds: No wheezing.     Comments: Diffusely distant bs  Abdominal:  General: Abdomen is flat. There is no distension.      Palpations: There is no mass.     Tenderness: There is no abdominal tenderness. There is no guarding or rebound.  Lymphadenopathy:     Cervical: No cervical adenopathy.  Skin:    General: Skin is warm and dry.     Findings: Erythema and rash present.     Comments: 2 by 2 area of mild induration and healed small papules or vesicles on right buttock/sacral area  No ulceration No excoriation Pink in color   No satellite lesions  Neurological:     Mental Status: She is alert.  Psychiatric:        Mood and Affect: Mood normal.           Assessment & Plan:   Problem List Items Addressed This Visit       Endocrine   Diabetes mellitus treated with oral medication (HCC)    Due for follow up  Will schedule annual exam next month Takes metformin XR 500 mg daily         Other   HSV infection - Primary    Sore area on right buttock with healing vesicles  Mildly tender  Resembles healing HSV outbreak   Discussed cleaning with soap and water  Offload pressure with sitting  Valtrex 500 mg bid for 3 d Update if not starting to improve in a week or if worsening  Call back and Er precautions noted in detail today        Relevant Medications   valACYclovir (VALTREX) 500 MG tablet

## 2022-12-06 NOTE — Addendum Note (Signed)
Addended by: Shon Millet on: 12/06/2022 10:34 AM   Modules accepted: Orders

## 2022-12-07 ENCOUNTER — Ambulatory Visit (INDEPENDENT_AMBULATORY_CARE_PROVIDER_SITE_OTHER): Payer: Medicare HMO | Admitting: Adult Health

## 2022-12-07 ENCOUNTER — Encounter: Payer: Self-pay | Admitting: Adult Health

## 2022-12-07 DIAGNOSIS — F411 Generalized anxiety disorder: Secondary | ICD-10-CM | POA: Diagnosis not present

## 2022-12-07 DIAGNOSIS — G4701 Insomnia due to medical condition: Secondary | ICD-10-CM

## 2022-12-07 NOTE — Telephone Encounter (Signed)
Last OV was yesterday, CPE scheduled 12/29/22.  Last filled on 05/11/22 #120 mL with 0 refills

## 2022-12-07 NOTE — Progress Notes (Signed)
Christine Barton [id number] Dec 03, 1950 72 y.o.  Subjective:   Patient ID:  Christine Barton is a 72 y.o. (DOB [birth year]) female.  Chief Complaint: No chief complaint on file.   HPI GITEL NISSLEY presents to the office today for follow-up of GAD and insomnia.   Describes mood today as "not so good". Pleasant. Reports tearfulness. Mood symptoms - reports depression, anxiety and irritability. Denies recent panic attacks. Reports worry, rumination, and over thinking. Mood is lower. Talking about her daughter - having issues. Discussing childhood and growing up. Does not feel like the Tranxene as needed is helpful with current mood symptoms. Stating "If something can make me feel better, I want to try it". Varying interest and motivation. Taking medications as prescribed. Energy levels lower. Active, does not have a regular exercise routine.  Enjoys some usual interests and activities. Lives alone with 6 cats. Lives next to daughter. Spending time with family. Planning a trip to [place]. Appetite decreased. Weight stable - 111 pounds. Sleeps well most nights. Averages 7 to 8 hours. Focus and concentration stable. Completing tasks. Managing aspects of household. Retired from [employer]. Denies SI or HI.  Denies AH or VH. Denies self harm. Denies substance use. Has seen a therapist previously.    GAD-7    Flowsheet Row Office Visit from 12/06/2022 in [facility]  Total GAD-7 Score 7      Mini-Mental    Flowsheet Row Clinical Support from 11/06/2018 in [facility] Clinical Support from 10/31/2017 in [facility]  Total Score (max 30 points ) 17 20      PHQ2-9    Flowsheet Row Office Visit from 12/06/2022 in [facility] Visit from 02/23/2022 in [facility] [place] Telephone from 12/31/2021 in [facility] Office Visit from 09/28/2021 in [facility] [place] Clinical Support from 11/07/2020 in [facility]  PHQ-2 Total Score 2 0 6 0 0  PHQ-9 Total Score 8 1 10  -- 0      Flowsheet Row Admission (Discharged) from 07/29/2022 in [facility] ENDOSCOPY ED from 07/27/2021 in [facility] Emergency Department at [facility]  C-SSRS RISK CATEGORY No Risk No Risk        Review of Systems:  Review of Systems  Musculoskeletal:  Negative for gait problem.  Neurological:  Negative for tremors.  Psychiatric/Behavioral:         Please refer to HPI    Medications: I have reviewed the patient's current medications.  Current Outpatient Medications  Medication Sig Dispense Refill   acetaminophen (TYLENOL) 500 MG tablet Take 500 mg by mouth every 6 (six) hours as needed for moderate pain.     albuterol (VENTOLIN HFA) 108 (90 Base) MCG/ACT inhaler INHALE 2 PUFFS INTO THE LUNGS EVERY 4 HOURS AS NEEDED FOR WHEEZE OR FOR SHORTNESS OF BREATH 8.5 each 3   cholecalciferol (VITAMIN D3) 25 MCG (1000 UNIT) tablet Take 1,000 Units by mouth 2 (two) times daily.     clorazepate (TRANXENE) 3.75 MG tablet Take 1 tablet (3.75 mg total) by mouth 2 (two) times daily as needed for anxiety. 60 tablet 2   cyclobenzaprine (FLEXERIL) 10 MG tablet TAKE 1/2 TABLET BY MOUTH AT BEDTIME AS NEEDED FOR MUSCLE SPASMS 15 tablet 3   furosemide (LASIX) 20 MG tablet  TAKE 1 TABLET BY MOUTH EVERY DAY 90 tablet 1   lactulose (CHRONULAC) 10 GM/15ML solution Take 15 mLs (10 g total) by mouth 3 (three) times daily. (Patient taking differently: Take 10 g by mouth See admin instructions. Take 10 g daily, may take a second 10 g dose as needed for constipation) 236 mL 11   metFORMIN (GLUCOPHAGE-XR) 500 MG 24 hr tablet TAKE 1 TABLET BY MOUTH EVERY DAY WITH BREAKFAST 90 tablet 0   Multiple Vitamin (MULTIVITAMIN) tablet Take 1 tablet by  mouth daily.     Multiple Vitamins-Minerals (PRESERVISION AREDS 2) CAPS Take 1 capsule by mouth 2 (two) times daily.     mupirocin ointment (BACTROBAN) 2 % Apply 1 Application topically 2 (two) times daily as needed (wound care/ skin infection). 15 g 1   nadolol (CORGARD) 20 MG tablet TAKE 1 TABLET BY MOUTH EVERY DAY 90 tablet 3   nystatin (MYCOSTATIN) 100000 UNIT/ML suspension TAKE 5 MLS (500,000 UNITS TOTAL) BY MOUTH 3 (THREE) TIMES DAILY. SWISH AND SWALLOW 120 mL 0   pantoprazole (PROTONIX) 40 MG tablet TAKE 1 TABLET (40 MG TOTAL) BY MOUTH TWICE A DAY BEFORE MEALS 180 tablet 1   SYSTANE COMPLETE 0.6 % SOLN Apply 1 drop to eye 2 (two) times daily.     Tiotropium Bromide Monohydrate (SPIRIVA RESPIMAT) 2.5 MCG/ACT AERS Inhale 2 each into the lungs daily.     ursodiol (ACTIGALL) 300 MG capsule TAKE 1 CAPSULE BY MOUTH EVERY DAY 90 capsule 1   valACYclovir (VALTREX) 500 MG tablet Take 1 tablet (500 mg total) by mouth 2 (two) times daily. 6 tablet 0   XIFAXAN 550 MG TABS tablet TAKE 1 TABLET BY MOUTH 2 TIMES DAILY. 60 tablet 3   No current facility-administered medications for this visit.    Medication Side Effects: None   Allergies:  Allergies  Allergen Reactions   Anoro Ellipta [Umeclidinium-Vilanterol] Cough   Bevespi Aerosphere [Glycopyrrolate-Formoterol] Cough    Prolonged coughing episode with first dose   Oxycodone Nausea And Vomiting   Crestor [Rosuvastatin]     Abdominal pain     Glipizide Other (See Comments)    Stomach pain   Prednisone     GI upset and general malaise     Past Medical History:  Diagnosis Date   Alcohol abuse, unspecified    Breast cancer (HCC) 1998   Right   Cataract    left eye   Cervical spondylosis 2006   MRI   Degeneration of cervical intervertebral disc 2006   MRI   Diabetes mellitus without complication (HCC)    Diverticulosis of colon (without mention of hemorrhage)    Hyperpotassemia    Lung cancer (HCC) 03/2021   started radiation in  January 2023, right side   Microscopic hematuria    Mononeuritis of unspecified site    Nonspecific abnormal results of liver function study    Other abnormal glucose    Other and unspecified hyperlipidemia    no per pt   Other chronic nonalcoholic liver disease    Personal history of chemotherapy    Personal history of malignant neoplasm of breast    Personal history of radiation therapy    Pneumothorax, acute    right, spontaneous   Tobacco use disorder    Unspecified vitamin D deficiency     Past Medical History, Surgical history, Social history, and Family history were reviewed and updated as appropriate.   Please see review of systems for further details on the patient's review  from today.   Objective:   Physical Exam:  LMP  (LMP Unknown)   Physical Exam Constitutional:      General: She is not in acute distress. Musculoskeletal:        General: No deformity.  Neurological:     Mental Status: She is alert and oriented to person, place, and time.     Coordination: Coordination normal.  Psychiatric:        Attention and Perception: Attention and perception normal. She does not perceive auditory or visual hallucinations.        Mood and Affect: Mood normal. Mood is not anxious or depressed. Affect is not labile, blunt, angry or inappropriate.        Speech: Speech normal.        Behavior: Behavior normal.        Thought Content: Thought content normal. Thought content is not paranoid or delusional. Thought content does not include homicidal or suicidal ideation. Thought content does not include homicidal or suicidal plan.        Cognition and Memory: Cognition and memory normal.        Judgment: Judgment normal.     Comments: Insight intact     Lab Review:     Component Value Date/Time   NA 133 (L) 06/22/2022 1436   NA 132 (L) 05/26/2021 1147   K 4.3 06/22/2022 1436   CL 100 06/22/2022 1436   CO2 27 06/22/2022 1436   GLUCOSE 109 (H) 06/22/2022 1436   BUN 14  06/22/2022 1436   BUN 21 05/26/2021 1147   CREATININE 0.73 06/22/2022 1436   CALCIUM 10.1 06/22/2022 1436   PROT 7.4 06/22/2022 1436   PROT 7.1 05/26/2021 1147   ALBUMIN 4.3 06/22/2022 1436   ALBUMIN 4.4 05/26/2021 1147   AST 15 06/22/2022 1436   ALT 6 06/22/2022 1436   ALKPHOS 87 06/22/2022 1436   BILITOT 0.5 06/22/2022 1436   GFRNONAA >60 06/22/2022 1436   GFRAA 83 12/11/2019 1617       Component Value Date/Time   WBC 7.8 09/15/2022 1136   WBC 6.2 12/21/2021 1026   RBC 4.50 09/15/2022 1136   HGB 14.2 09/15/2022 1136   HGB 12.4 12/11/2019 1617   HCT 41.1 09/15/2022 1136   HCT 39.3 12/11/2019 1617   PLT 188 09/15/2022 1136   PLT 120 (L) 12/11/2019 1617   MCV 91.3 09/15/2022 1136   MCV 86 12/11/2019 1617   MCH 31.6 09/15/2022 1136   MCHC 34.5 09/15/2022 1136   RDW 15.7 (H) 09/15/2022 1136   RDW 17.0 (H) 12/11/2019 1617   LYMPHSABS 1.6 09/15/2022 1136   MONOABS 0.5 09/15/2022 1136   EOSABS 0.1 09/15/2022 1136   BASOSABS 0.0 09/15/2022 1136    No results found for: "POCLITH", "LITHIUM"   No results found for: "PHENYTOIN", "PHENOBARB", "VALPROATE", "CBMZ"   .res Assessment: Plan:    Plan:  PDMP reviewed  1. Tranxene 3.75 BID prn anxiety  Will review notes and make recommendations for antidepressant medication.   RTC 6 months  Patient requesting notes be kept confidential - not locked.  Time spent with patient was 25 minutes. Greater than 50% of face to face time with patient was spent on counseling and coordination of care.    Patient advised to contact office with any questions, adverse effects, or acute worsening in signs and symptoms.  Discussed potential benefits, risk, and side effects of benzodiazepines to include potential risk of tolerance and dependence, as well as possible drowsiness.  Advised patient not to drive if experiencing drowsiness and to take lowest possible effective dose to minimize risk of dependence and tolerance.   There are no  diagnoses linked to this encounter.   Please see After Visit Summary for patient specific instructions.  Future Appointments  Date Time Provider Department Center  12/15/2022 11:15 AM [facility] LAB [facility] None  12/20/2022 11:00 AM Salena Saner, MD [facility] None  12/20/2022  1:00 PM [facility]CT [facility]  12/22/2022 10:00 AM [facility] LAB [facility] PEC  12/23/2022  3:45 PM Si Gaul, MD [facility] None  12/29/2022 11:30 AM [facility], Audrie Gallus, MD [facility] PEC  05/24/2023  3:00 PM Aquila Menzie, Thereasa Solo, NP CP-CP None    No orders of the defined types were placed in this encounter.   -------------------------------

## 2022-12-14 ENCOUNTER — Telehealth: Payer: Self-pay | Admitting: Family Medicine

## 2022-12-14 DIAGNOSIS — E559 Vitamin D deficiency, unspecified: Secondary | ICD-10-CM

## 2022-12-14 DIAGNOSIS — Z79899 Other long term (current) drug therapy: Secondary | ICD-10-CM | POA: Insufficient documentation

## 2022-12-14 DIAGNOSIS — D509 Iron deficiency anemia, unspecified: Secondary | ICD-10-CM

## 2022-12-14 DIAGNOSIS — K7031 Alcoholic cirrhosis of liver with ascites: Secondary | ICD-10-CM

## 2022-12-14 DIAGNOSIS — E119 Type 2 diabetes mellitus without complications: Secondary | ICD-10-CM

## 2022-12-14 DIAGNOSIS — E1169 Type 2 diabetes mellitus with other specified complication: Secondary | ICD-10-CM

## 2022-12-14 NOTE — Telephone Encounter (Signed)
-----   Message from Alvina Chou sent at 12/10/2022  9:53 AM EDT ----- Regarding: lab orders for [facility] 9.5.24 Patient is scheduled for CPX labs, please order future labs, Thanks , Camelia Eng

## 2022-12-15 ENCOUNTER — Telehealth: Payer: Self-pay | Admitting: Adult Health

## 2022-12-15 ENCOUNTER — Inpatient Hospital Stay: Payer: Medicare HMO | Attending: Physician Assistant

## 2022-12-15 DIAGNOSIS — Z85118 Personal history of other malignant neoplasm of bronchus and lung: Secondary | ICD-10-CM | POA: Insufficient documentation

## 2022-12-15 DIAGNOSIS — C3431 Malignant neoplasm of lower lobe, right bronchus or lung: Secondary | ICD-10-CM

## 2022-12-15 DIAGNOSIS — D500 Iron deficiency anemia secondary to blood loss (chronic): Secondary | ICD-10-CM | POA: Insufficient documentation

## 2022-12-15 DIAGNOSIS — K922 Gastrointestinal hemorrhage, unspecified: Secondary | ICD-10-CM | POA: Insufficient documentation

## 2022-12-15 LAB — CMP (CANCER CENTER ONLY)
ALT: 7 U/L (ref 0–44)
AST: 19 U/L (ref 15–41)
Albumin: 4.3 g/dL (ref 3.5–5.0)
Alkaline Phosphatase: 73 U/L (ref 38–126)
Anion gap: 5 (ref 5–15)
BUN: 12 mg/dL (ref 8–23)
CO2: 31 mmol/L (ref 22–32)
Calcium: 9.8 mg/dL (ref 8.9–10.3)
Chloride: 99 mmol/L (ref 98–111)
Creatinine: 0.78 mg/dL (ref 0.44–1.00)
GFR, Estimated: >60 mL/min (ref >60)
Glucose, Bld: 157 mg/dL — ABNORMAL HIGH (ref 70–99)
Potassium: 4.3 mmol/L (ref 3.5–5.1)
Sodium: 135 mmol/L (ref 135–145)
Total Bilirubin: 0.6 mg/dL (ref 0.3–1.2)
Total Protein: 7.3 g/dL (ref 6.5–8.1)

## 2022-12-15 LAB — IRON AND IRON BINDING CAPACITY (CC-WL,HP ONLY)
Iron: 57 ug/dL (ref 28–170)
Saturation Ratios: 11 % (ref 10.4–31.8)
TIBC: 515 ug/dL — ABNORMAL HIGH (ref 250–450)
UIBC: 458 ug/dL — ABNORMAL HIGH (ref 148–442)

## 2022-12-15 LAB — CBC WITH DIFFERENTIAL (CANCER CENTER ONLY)
Abs Immature Granulocytes: 0.06 10*3/uL (ref 0.00–0.07)
Basophils Absolute: 0 10*3/uL (ref 0.0–0.1)
Basophils Relative: 1 %
Eosinophils Absolute: 0.1 10*3/uL (ref 0.0–0.5)
Eosinophils Relative: 2 %
HCT: 37.3 % (ref 36.0–46.0)
Hemoglobin: 12.4 g/dL (ref 12.0–15.0)
Immature Granulocytes: 1 %
Lymphocytes Relative: 21 %
Lymphs Abs: 1.2 10*3/uL (ref 0.7–4.0)
MCH: 31.5 pg (ref 26.0–34.0)
MCHC: 33.2 g/dL (ref 30.0–36.0)
MCV: 94.7 fL (ref 80.0–100.0)
Monocytes Absolute: 0.5 10*3/uL (ref 0.1–1.0)
Monocytes Relative: 8 %
Neutro Abs: 4.1 10*3/uL (ref 1.7–7.7)
Neutrophils Relative %: 67 %
Platelet Count: 209 10*3/uL (ref 150–400)
RBC: 3.94 MIL/uL (ref 3.87–5.11)
RDW: 15.3 % (ref 11.5–15.5)
WBC Count: 6 10*3/uL (ref 4.0–10.5)
nRBC: 0 % (ref 0.0–0.2)

## 2022-12-15 LAB — FERRITIN: Ferritin: 11 ng/mL (ref 11–307)

## 2022-12-15 NOTE — Telephone Encounter (Signed)
Pt called at 1:48p stating she is waiting to hear back from [facility] about a medication they discussed at her visit on 8/27.  Next appt 2/11

## 2022-12-15 NOTE — Telephone Encounter (Signed)
Patient said you had discussed another medication, but had to research it due to patient's liver issues. She said you never mentioned a medication. She is just calling to check status. She is only taking Tranxene currently.

## 2022-12-16 ENCOUNTER — Telehealth: Payer: Self-pay | Admitting: Adult Health

## 2022-12-16 ENCOUNTER — Other Ambulatory Visit (INDEPENDENT_AMBULATORY_CARE_PROVIDER_SITE_OTHER): Payer: Medicare HMO

## 2022-12-16 ENCOUNTER — Telehealth: Payer: Self-pay

## 2022-12-16 DIAGNOSIS — Z79899 Other long term (current) drug therapy: Secondary | ICD-10-CM

## 2022-12-16 DIAGNOSIS — D509 Iron deficiency anemia, unspecified: Secondary | ICD-10-CM

## 2022-12-16 DIAGNOSIS — E559 Vitamin D deficiency, unspecified: Secondary | ICD-10-CM | POA: Diagnosis not present

## 2022-12-16 DIAGNOSIS — E119 Type 2 diabetes mellitus without complications: Secondary | ICD-10-CM

## 2022-12-16 DIAGNOSIS — K7031 Alcoholic cirrhosis of liver with ascites: Secondary | ICD-10-CM | POA: Diagnosis not present

## 2022-12-16 DIAGNOSIS — E1169 Type 2 diabetes mellitus with other specified complication: Secondary | ICD-10-CM

## 2022-12-16 DIAGNOSIS — Z7984 Long term (current) use of oral hypoglycemic drugs: Secondary | ICD-10-CM

## 2022-12-16 DIAGNOSIS — E785 Hyperlipidemia, unspecified: Secondary | ICD-10-CM | POA: Diagnosis not present

## 2022-12-16 LAB — CBC WITH DIFFERENTIAL/PLATELET
Basophils Absolute: 0 10*3/uL (ref 0.0–0.1)
Basophils Relative: 0.6 % (ref 0.0–3.0)
Eosinophils Absolute: 0.1 10*3/uL (ref 0.0–0.7)
Eosinophils Relative: 1.8 % (ref 0.0–5.0)
HCT: 37.9 % (ref 36.0–46.0)
Hemoglobin: 12.5 g/dL (ref 12.0–15.0)
Lymphocytes Relative: 24.3 % (ref 12.0–46.0)
Lymphs Abs: 1.6 10*3/uL (ref 0.7–4.0)
MCHC: 33.1 g/dL (ref 30.0–36.0)
MCV: 94.8 fl (ref 78.0–100.0)
Monocytes Absolute: 0.5 10*3/uL (ref 0.1–1.0)
Monocytes Relative: 7.9 % (ref 3.0–12.0)
Neutro Abs: 4.2 10*3/uL (ref 1.4–7.7)
Neutrophils Relative %: 65.4 % (ref 43.0–77.0)
Platelets: 247 10*3/uL (ref 150.0–400.0)
RBC: 4 Mil/uL (ref 3.87–5.11)
RDW: 16.4 % — ABNORMAL HIGH (ref 11.5–15.5)
WBC: 6.4 10*3/uL (ref 4.0–10.5)

## 2022-12-16 LAB — COMPREHENSIVE METABOLIC PANEL
ALT: 7 U/L (ref 0–35)
AST: 17 U/L (ref 0–37)
Albumin: 4 g/dL (ref 3.5–5.2)
Alkaline Phosphatase: 74 U/L (ref 39–117)
BUN: 12 mg/dL (ref 6–23)
CO2: 29 meq/L (ref 19–32)
Calcium: 9.7 mg/dL (ref 8.4–10.5)
Chloride: 99 meq/L (ref 96–112)
Creatinine, Ser: 0.81 mg/dL (ref 0.40–1.20)
GFR: 72.73 mL/min (ref >60.00)
Glucose, Bld: 120 mg/dL — ABNORMAL HIGH (ref 70–99)
Potassium: 4.6 meq/L (ref 3.5–5.1)
Sodium: 133 meq/L — ABNORMAL LOW (ref 135–145)
Total Bilirubin: 0.5 mg/dL (ref 0.2–1.2)
Total Protein: 6.8 g/dL (ref 6.0–8.3)

## 2022-12-16 LAB — LIPID PANEL
Cholesterol: 189 mg/dL (ref 0–200)
HDL: 40.5 mg/dL (ref >39.00)
LDL Cholesterol: 108 mg/dL — ABNORMAL HIGH (ref 0–99)
NonHDL: 148.69
Total CHOL/HDL Ratio: 5
Triglycerides: 204 mg/dL — ABNORMAL HIGH (ref 0.0–149.0)
VLDL: 40.8 mg/dL — ABNORMAL HIGH (ref 0.0–40.0)

## 2022-12-16 LAB — HEMOGLOBIN A1C: Hgb A1c MFr Bld: 5.8 % (ref 4.6–6.5)

## 2022-12-16 LAB — MICROALBUMIN / CREATININE URINE RATIO
Creatinine,U: 38.7 mg/dL
Microalb Creat Ratio: 3 mg/g (ref 0.0–30.0)
Microalb, Ur: 1.2 mg/dL (ref 0.0–1.9)

## 2022-12-16 LAB — VITAMIN B12: Vitamin B-12: 404 pg/mL (ref 211–911)

## 2022-12-16 LAB — VITAMIN D 25 HYDROXY (VIT D DEFICIENCY, FRACTURES): VITD: 107.36 ng/mL (ref 30.00–100.00)

## 2022-12-16 LAB — TSH: TSH: 3.45 u[IU]/mL (ref 0.35–5.50)

## 2022-12-16 MED ORDER — DESVENLAFAXINE SUCCINATE ER 25 MG PO TB24: 25.0000 mg | ORAL_TABLET | Freq: Every day | ORAL | 1 refills | Status: DC

## 2022-12-16 NOTE — Telephone Encounter (Signed)
Pharmacy sent PA Request for Desvenlafaxine ER 25mg  , See CMM

## 2022-12-16 NOTE — Telephone Encounter (Signed)
Pt notified she was taking D3 BID but will stop until her appt with PCP on the 18th

## 2022-12-16 NOTE — Telephone Encounter (Signed)
Patient notified and Rx sent

## 2022-12-16 NOTE — Telephone Encounter (Signed)
Agree with that -hold the supplement until visit Thanks

## 2022-12-16 NOTE — Telephone Encounter (Signed)
Sai with [facility] lab called c ritical report on Vitamin D 107.36. pt had CPX labs today not in epic. Pt has appt 12/29/22 for CPX with Dr Milinda Antis. On 12/21/21 Vitamin D lab was 96.57. sending note to Dr Milinda Antis who is out of office, [facility] pool and Dr Alphonsus Sias who.is in office for review. Results posted in lab notebook.

## 2022-12-18 ENCOUNTER — Other Ambulatory Visit: Payer: Self-pay | Admitting: Gastroenterology

## 2022-12-18 ENCOUNTER — Other Ambulatory Visit: Payer: Self-pay | Admitting: Family Medicine

## 2022-12-20 ENCOUNTER — Ambulatory Visit
Admission: RE | Admit: 2022-12-20 | Discharge: 2022-12-20 | Disposition: A | Discharge status: Home / Self Care | Payer: Medicare HMO | Source: Ambulatory Visit | Attending: Physician Assistant | Admitting: Physician Assistant

## 2022-12-20 ENCOUNTER — Ambulatory Visit: Payer: Medicare HMO | Admitting: Pulmonary Disease

## 2022-12-20 ENCOUNTER — Telehealth: Payer: Self-pay | Admitting: Adult Health

## 2022-12-20 ENCOUNTER — Encounter: Payer: Self-pay | Admitting: Pulmonary Disease

## 2022-12-20 VITALS — BP 120/78 | HR 73 | Temp 98.1°F | Ht 64.0 in | Wt 111.8 lb

## 2022-12-20 DIAGNOSIS — C3431 Malignant neoplasm of lower lobe, right bronchus or lung: Secondary | ICD-10-CM | POA: Insufficient documentation

## 2022-12-20 DIAGNOSIS — J984 Other disorders of lung: Secondary | ICD-10-CM | POA: Diagnosis not present

## 2022-12-20 DIAGNOSIS — J432 Centrilobular emphysema: Secondary | ICD-10-CM | POA: Diagnosis not present

## 2022-12-20 DIAGNOSIS — C3411 Malignant neoplasm of upper lobe, right bronchus or lung: Secondary | ICD-10-CM

## 2022-12-20 DIAGNOSIS — F17210 Nicotine dependence, cigarettes, uncomplicated: Secondary | ICD-10-CM | POA: Diagnosis not present

## 2022-12-20 DIAGNOSIS — I700 Atherosclerosis of aorta: Secondary | ICD-10-CM | POA: Diagnosis not present

## 2022-12-20 DIAGNOSIS — C3490 Malignant neoplasm of unspecified part of unspecified bronchus or lung: Secondary | ICD-10-CM | POA: Diagnosis not present

## 2022-12-20 MED ORDER — SPIRIVA RESPIMAT 2.5 MCG/ACT IN AERS: 2.0000 | INHALATION_SPRAY | Freq: Every day | RESPIRATORY_TRACT | 0 refills | Status: DC

## 2022-12-20 MED ORDER — IOHEXOL 300 MG/ML  SOLN
65.0000 mL | Freq: Once | INTRAMUSCULAR | Status: AC | PRN
  Administered 2022-12-20: 65 mL via INTRAVENOUS

## 2022-12-20 NOTE — Telephone Encounter (Signed)
Thanks will check on.

## 2022-12-20 NOTE — Telephone Encounter (Signed)
Bactroban last filled on 07/28/22 #15 g with 1 refill  Metformin also due for refill  CPE 12/29/22

## 2022-12-20 NOTE — Patient Instructions (Addendum)
I will take a look at your CT when this is done ever I will still wait for the radiologist final report.  Will let you know if there are any areas of concern.  Do note that the readings for the CT by the radiologist have been delayed to almost a week due to short staff situation with radiology.  But we will let you know if there are any issues of concern once we know the final report.  We have provided you  with Spiriva samples.  We will see her in follow-up in 4 months time call sooner should any new problems arise.

## 2022-12-20 NOTE — Progress Notes (Signed)
Subjective:    Patient ID: Christine Barton, female    DOB: Mar 07, 1951, 72 y.o.   MRN: [id number]  Patient Care Team: Judy Pimple, MD as PCP - General Little Ishikawa, MD as PCP - Cardiology (Cardiology) Kathyrn Sheriff, [facility] (Inactive) as Pharmacist (Pharmacist)  Chief Complaint  Patient presents with   Follow-up    DOE unchanged, no wheezing.  Morning cough with clear sputum.    HPI The patient is a 72 year old current smoker (1 PPD) who presents for follow-up of COPD by clinical impression.  Recall that she was initially evaluated for an 8 mm right upper lobe nodule found incidentally on low-dose CT chest during lung cancer screening. The patient had had this abnormality followed previously imaged in October 2021 at that time being only 3.7 mm.  The nodule is on the same area of prior radiation changes breast cancer noted in the right lung. PET/CT performed on 11 February 2021 showed FDG avidity in this nodule.  There was no evidence of other abnormal uptake in the chest with the exception of the aforementioned area of scarring.  The patient opted to forego invasive procedures and go directly to SBRT due to her multiple comorbidities.  She completed SBRT to this area.  She tolerated this well.  He has been monitored closely for potential recurrence.  Most recent CT was on 25 May 2022 and this showed increasing nodular area along the major fissure, PET/CT was recommended.  PET/CT was performed 15 June 2022 this showed no findings suspicious for focal recurrence or metastatic disease.  The increasing nodular density associated with the superior aspect of the major fissure showed only low-level metabolic activity favoring an area of evolving radiation fibrosis.  She is to have follow-up CT today.    She has been maintained on Spiriva 2 puffs daily which she prefers to split between morning and evening dose.  She has difficulty with inhalers containing ICS due to recurrent  thrush.  The patient notes that Spiriva helps control her shortness of breath and wheezing.  He does admit she did not use her medications this morning as she was "rushed" coming out of the house.  She has not had any fevers, chills or sweats.  No chest pain.  Weight is stable.  She has had no anorexia.  No lower extremity edema nor calf tenderness.  Does not endorse any other symptomatology.   She continues to smoke a pack of cigarettes a day.     Review of Systems A 10 point review of systems was performed and it is as noted above otherwise negative.   Patient Active Problem List   Diagnosis Date Noted   Current use of proton pump inhibitor 12/14/2022   Osteopenia 06/17/2022   Cat bite of upper arm, left, initial encounter 12/23/2021   Pulmonary emphysema (HCC) 09/03/2021   Shortness of breath 09/03/2021   Agatston CAC score, >400 07/02/2021   Left-sided chest wall pain 06/04/2021   Lung cancer (HCC) 02/25/2021   Iron deficiency anemia 01/01/2021   Iron deficiency anemia due to chronic blood loss 11/06/2020   AVM (arteriovenous malformation) of colon    Benign neoplasm of colon    NASH (nonalcoholic steatohepatitis)    Malnutrition (HCC) 07/25/2019   HSV infection 02/23/2019   Aortic atherosclerosis (HCC) 01/22/2019   Coronary atherosclerosis 01/22/2019   Encounter for screening for lung cancer 01/04/2019   Venous insufficiency 11/15/2018   Esophageal varices without bleeding (HCC) 10/27/2018   Anemia  10/23/2018   Marasmus (HCC) 07/26/2018   Poor balance 05/30/2018   Cirrhosis of liver (HCC) 04/24/2018   Gallstones 04/24/2018   Heme positive stool 04/24/2018   Screening mammogram, encounter for 11/07/2017   Smoker 11/07/2017   Screening examination for STD (sexually transmitted disease) 11/11/2016   Welcome to Medicare preventive visit 10/26/2016   Estrogen deficiency 10/26/2016   Colon cancer screening 11/01/2014   Encounter for routine gynecological examination  09/12/2013   Left shoulder pain 08/20/2011   Routine general medical examination at a health care facility 07/21/2011   Vitamin D deficiency 08/04/2009   POSTMENOPAUSAL STATUS 08/04/2009   Diabetes mellitus treated with oral medication (HCC) 02/18/2009   Hyperlipidemia associated with type 2 diabetes mellitus (HCC) 06/14/2008   DISC DISEASE, CERVICAL 03/30/2007   History of alcohol abuse 12/06/2006   Neuropathy of both feet 12/06/2006   DIVERTICULOSIS, COLON 12/06/2006   Fatty liver 12/06/2006   BREAST CANCER, HX OF 12/06/2006    Social History   Tobacco Use   Smoking status: Every Day    Current packs/day: 1.00    Average packs/day: 1 pack/day for 58.1 years (58.1 ttl pk-yrs)    Types: Cigarettes    Start date: 09/25/2021    Passive exposure: Past   Smokeless tobacco: Never   Tobacco comments:    Started smoking at 72 years old.     1PPD 12/20/2022 khj  Substance Use Topics   Alcohol use: Not Currently    Allergies  Allergen Reactions   Anoro Ellipta [Umeclidinium-Vilanterol] Cough   Bevespi Aerosphere [Glycopyrrolate-Formoterol] Cough    Prolonged coughing episode with first dose   Oxycodone Nausea And Vomiting   Crestor [Rosuvastatin]     Abdominal pain     Glipizide Other (See Comments)    Stomach pain    Current Meds  Medication Sig   acetaminophen (TYLENOL) 500 MG tablet Take 500 mg by mouth every 6 (six) hours as needed for moderate pain.   albuterol (VENTOLIN HFA) 108 (90 Base) MCG/ACT inhaler INHALE 2 PUFFS INTO THE LUNGS EVERY 4 HOURS AS NEEDED FOR WHEEZE OR FOR SHORTNESS OF BREATH   cholecalciferol (VITAMIN D3) 25 MCG (1000 UNIT) tablet Take 1,000 Units by mouth 2 (two) times daily.   clorazepate (TRANXENE) 3.75 MG tablet Take 1 tablet (3.75 mg total) by mouth 2 (two) times daily as needed for anxiety.   cyclobenzaprine (FLEXERIL) 10 MG tablet TAKE 1/2 TABLET BY MOUTH AT BEDTIME AS NEEDED FOR MUSCLE SPASMS   desvenlafaxine (PRISTIQ) 25 MG 24 hr tablet Take  1 tablet (25 mg total) by mouth daily.   furosemide (LASIX) 20 MG tablet TAKE 1 TABLET BY MOUTH EVERY DAY   lactulose (CHRONULAC) 10 GM/15ML solution Take 15 mLs (10 g total) by mouth 3 (three) times daily. (Patient taking differently: Take 10 g by mouth See admin instructions. Take 10 g daily, may take a second 10 g dose as needed for constipation)   metFORMIN (GLUCOPHAGE-XR) 500 MG 24 hr tablet TAKE 1 TABLET BY MOUTH EVERY DAY WITH BREAKFAST   Multiple Vitamin (MULTIVITAMIN) tablet Take 1 tablet by mouth daily.   Multiple Vitamins-Minerals (PRESERVISION AREDS 2) CAPS Take 1 capsule by mouth 2 (two) times daily.   mupirocin ointment (BACTROBAN) 2 % Apply 1 Application topically 2 (two) times daily as needed (wound care/ skin infection).   nadolol (CORGARD) 20 MG tablet TAKE 1 TABLET BY MOUTH EVERY DAY   nystatin (MYCOSTATIN) 100000 UNIT/ML suspension TAKE 5 MLS (500,000 UNITS TOTAL) BY MOUTH  3 (THREE) TIMES DAILY. SWISH AND SWALLOW   pantoprazole (PROTONIX) 40 MG tablet TAKE 1 TABLET (40 MG TOTAL) BY MOUTH TWICE A DAY BEFORE MEALS   SYSTANE COMPLETE 0.6 % SOLN Apply 1 drop to eye 2 (two) times daily.   Tiotropium Bromide Monohydrate (SPIRIVA RESPIMAT) 2.5 MCG/ACT AERS Inhale 2 each into the lungs daily.   ursodiol (ACTIGALL) 300 MG capsule TAKE 1 CAPSULE BY MOUTH EVERY DAY   [DISCONTINUED] XIFAXAN 550 MG TABS tablet TAKE 1 TABLET BY MOUTH 2 TIMES DAILY.    Immunization History  Administered Date(s) Administered   Fluad Quad(high Dose 65+) 12/04/2018, 01/19/2020, 12/23/2021   Fluad Trivalent(High Dose 65+) 12/06/2022   Hep A / Hep B 09/05/2018, 10/10/2018, 03/29/2019   Influenza Split 03/10/2011   Influenza Whole 02/11/2000, 02/06/2016   Influenza, High Dose Seasonal PF 02/03/2021   Influenza,inj,Quad PF,6+ Mos 02/16/2017, 04/18/2018   Influenza-Unspecified 02/11/2016   PFIZER(Purple Top)SARS-COV-2 Vaccination 05/21/2019, 06/16/2019, 02/13/2020   Pfizer Covid-19 Vaccine Bivalent Booster  43yrs & up 02/10/2021   Pneumococcal Conjugate-13 10/26/2016   Pneumococcal Polysaccharide-23 06/28/2008, 11/07/2017   Respiratory Syncytial Virus Vaccine,Recomb Aduvanted(Arexvy) 05/18/2022   Td 05/21/2002   Tdap 09/12/2013   Zoster Recombinant(Shingrix) 06/18/2022   Zoster, Live 08/24/2011        Objective:     BP 120/78 (BP Location: Right Arm, Cuff Size: Normal)   Pulse 73   Temp 98.1 F (36.7 C)   Ht 5\' 4"  (1.626 m)   Wt 111 lb 12.8 oz (50.7 kg)   LMP  (LMP Unknown)   SpO2 100%   BMI 19.19 kg/m   SpO2: 100 % O2 Device: None (Room air)  GENERAL: Thin, frail woman, no acute distress, fully ambulatory, no conversational dyspnea. HEAD: Normocephalic, atraumatic.  EYES: Pupils equal, round, reactive to light.  No scleral icterus.  MOUTH: Dentures uppers and lowers, no evidence of thrush. NECK: Supple. No thyromegaly. Trachea midline. No JVD.  No adenopathy. PULMONARY: Good air entry bilaterally.  Coarse breath sounds, diffuse end expiratory wheezes. CARDIOVASCULAR: S1 and S2. Regular rate and rhythm.  ABDOMEN: Scaphoid otherwise benign. MUSCULOSKELETAL: No joint deformity, no clubbing, no edema.  NEUROLOGIC: Grossly nonfocal.  Speech fluent. SKIN: Intact,warm,dry. PSYCH: Mood and behavior normal.      Assessment & Plan:     ICD-10-CM   1. Centrilobular emphysema (HCC)  J43.2    Continue Spiriva 2 puffs daily Continue as needed albuterol Smoking cessation recommended    2. Malignant neoplasm of upper lobe of right lung (HCC)  C34.11    Status post SBRT To have follow-up CT today    3. Tobacco dependence due to cigarettes  F17.210    Patient counseled regards to discontinuation of smoking Total counseling time 3 to 5 minutes     Meds ordered this encounter  Medications   Tiotropium Bromide Monohydrate (SPIRIVA RESPIMAT) 2.5 MCG/ACT AERS    Sig: Inhale 2 puffs into the lungs daily.    Dispense:  12 g    Refill:  0    Order Specific Question:   Lot  Number?    Answer:   [id number]    Order Specific Question:   Expiration Date?    Answer:   04/12/2024    Order Specific Question:   Quantity    Answer:   3    Smoking cessation instruction/counseling given:  counseled patient on the dangers of tobacco use, advised patient to stop smoking, and reviewed strategies to maximize success.  We will let patient know  results of CT once these are known.  She was provided with Spiriva samples today.  Will see her in follow-up 4 months time she is to contact us prior to that time should any new difficulties arise.   Gailen Shelter, MD Advanced Bronchoscopy PCC[facility]    *This note was dictated using voice recognition software/Dragon.  Despite best efforts to proofread, errors can occur which can change the meaning. Any transcriptional errors that result from this process are unintentional and may not be fully corrected at the time of dictation.

## 2022-12-20 NOTE — Telephone Encounter (Signed)
Pt called at 1:48p requesting update on the PA for Pristiq.   I told her the pharmacy sent [place] the PA  on Thurs 9/5.  Pls cal her back to advise update.     Next appt 2/11

## 2022-12-20 NOTE — Telephone Encounter (Signed)
I sent the initial part of PA to plan. It comes back with questions, one of which she taking Pristiq for MDD. She has Rx of anxiety, but not MDD. No documentation is needed for Medicare part D. I didn't know how I should answer this and just saved it for your review.

## 2022-12-21 ENCOUNTER — Telehealth: Payer: Self-pay | Admitting: Adult Health

## 2022-12-21 ENCOUNTER — Telehealth: Payer: Self-pay | Admitting: Family Medicine

## 2022-12-21 NOTE — Telephone Encounter (Signed)
-----   Message from Lovena Neighbours sent at 12/07/2022  3:14 PM EDT ----- Regarding: Labs for 9.11.24 Please put physical lab orders in future. Thank you, Denny Peon

## 2022-12-21 NOTE — Telephone Encounter (Signed)
[employer]/Medicare approved Desvenlafaxine Succinate ER 24 hr ER, 04/12/22-04/12/23

## 2022-12-22 ENCOUNTER — Ambulatory Visit: Payer: Medicare HMO | Admitting: Adult Health

## 2022-12-22 ENCOUNTER — Other Ambulatory Visit: Payer: Medicare HMO

## 2022-12-23 ENCOUNTER — Other Ambulatory Visit: Payer: Medicare HMO

## 2022-12-23 ENCOUNTER — Inpatient Hospital Stay (HOSPITAL_BASED_OUTPATIENT_CLINIC_OR_DEPARTMENT_OTHER): Payer: Medicare HMO | Admitting: Internal Medicine

## 2022-12-23 VITALS — BP 133/62 | HR 77 | Temp 98.4°F | Resp 16 | Ht 64.0 in | Wt 112.1 lb

## 2022-12-23 DIAGNOSIS — D500 Iron deficiency anemia secondary to blood loss (chronic): Secondary | ICD-10-CM | POA: Diagnosis not present

## 2022-12-23 DIAGNOSIS — Z85118 Personal history of other malignant neoplasm of bronchus and lung: Secondary | ICD-10-CM | POA: Diagnosis not present

## 2022-12-23 DIAGNOSIS — C3490 Malignant neoplasm of unspecified part of unspecified bronchus or lung: Secondary | ICD-10-CM

## 2022-12-23 DIAGNOSIS — K922 Gastrointestinal hemorrhage, unspecified: Secondary | ICD-10-CM | POA: Diagnosis not present

## 2022-12-23 NOTE — Progress Notes (Signed)
[facility] [phone]   [phone]  OFFICE PROGRESS NOTE  Tower, Audrie Gallus, MD [address] [place] [address]  DIAGNOSIS:  1) Anemia secondary to GI occult blood loss.   2) suspicious for stage Ia (T1 a, N0, M0) lung cancer.  The patient declined bronchoscopy for tissue biopsy.  PRIOR THERAPY:  1) Oral iron supplements. Discontinued due to intolerance (severe abdominal pain) 2) status post SBRT to the lung lesion under the care of Dr. Aggie Cosier at [facility].  CURRENT THERAPY: IV iron infusions with Monoferric.   INTERVAL HISTORY: Christine Barton 72 y.o. female returns to the clinic today for follow-up visit.  The patient is feeling much better today with no concerning complaints except for occasional shortness of breath.  She denied having any significant fatigue or weakness.  She denied having any nausea, vomiting, diarrhea or constipation.  She has no headache or visual changes.  She has no recent weight loss or night sweats.  She is here today for evaluation with repeat blood work as well as CT scan of the chest for restaging of her disease.  MEDICAL HISTORY: Past Medical History:  Diagnosis Date   Alcohol abuse, unspecified    Breast cancer (HCC) 1998   Right   Cataract    left eye   Cervical spondylosis 2006   MRI   Degeneration of cervical intervertebral disc 2006   MRI   Diabetes mellitus without complication (HCC)    Diverticulosis of colon (without mention of hemorrhage)    Hyperpotassemia    Lung cancer (HCC) 03/2021   started radiation in January 2023, right side   Microscopic hematuria    Mononeuritis of unspecified site    Nonspecific abnormal results of liver function study    Other abnormal glucose    Other and unspecified hyperlipidemia    no per pt   Other chronic nonalcoholic liver disease    Personal history of chemotherapy    Personal history of malignant neoplasm of breast    Personal  history of radiation therapy    Pneumothorax, acute    right, spontaneous   Tobacco use disorder    Unspecified vitamin D deficiency     ALLERGIES:  is allergic to anoro ellipta [umeclidinium-vilanterol], bevespi aerosphere [glycopyrrolate-formoterol], oxycodone, crestor [rosuvastatin], and glipizide.  MEDICATIONS:  Current Outpatient Medications  Medication Sig Dispense Refill   acetaminophen (TYLENOL) 500 MG tablet Take 500 mg by mouth every 6 (six) hours as needed for moderate pain.     albuterol (VENTOLIN HFA) 108 (90 Base) MCG/ACT inhaler INHALE 2 PUFFS INTO THE LUNGS EVERY 4 HOURS AS NEEDED FOR WHEEZE OR FOR SHORTNESS OF BREATH 8.5 each 3   cholecalciferol (VITAMIN D3) 25 MCG (1000 UNIT) tablet Take 1,000 Units by mouth 2 (two) times daily.     clorazepate (TRANXENE) 3.75 MG tablet Take 1 tablet (3.75 mg total) by mouth 2 (two) times daily as needed for anxiety. 60 tablet 2   cyclobenzaprine (FLEXERIL) 10 MG tablet TAKE 1/2 TABLET BY MOUTH AT BEDTIME AS NEEDED FOR MUSCLE SPASMS 15 tablet 3   desvenlafaxine (PRISTIQ) 25 MG 24 hr tablet Take 1 tablet (25 mg total) by mouth daily. 30 tablet 1   furosemide (LASIX) 20 MG tablet TAKE 1 TABLET BY MOUTH EVERY DAY 90 tablet 1   lactulose (CHRONULAC) 10 GM/15ML solution Take 15 mLs (10 g total) by mouth 3 (three) times daily. (Patient taking differently: Take 10  g by mouth See admin instructions. Take 10 g daily, may take a second 10 g dose as needed for constipation) 236 mL 11   metFORMIN (GLUCOPHAGE-XR) 500 MG 24 hr tablet TAKE 1 TABLET BY MOUTH EVERY DAY WITH BREAKFAST 90 tablet 0   Multiple Vitamin (MULTIVITAMIN) tablet Take 1 tablet by mouth daily.     Multiple Vitamins-Minerals (PRESERVISION AREDS 2) CAPS Take 1 capsule by mouth 2 (two) times daily.     mupirocin ointment (BACTROBAN) 2 % APPLY 1 APPLICATION TOPICALLY 2 TIMES DAILY AS NEEDED. 15 g 3   nadolol (CORGARD) 20 MG tablet TAKE 1 TABLET BY MOUTH EVERY DAY 90 tablet 3   nystatin  (MYCOSTATIN) 100000 UNIT/ML suspension TAKE 5 MLS (500,000 UNITS TOTAL) BY MOUTH 3 (THREE) TIMES DAILY. SWISH AND SWALLOW 120 mL 0   pantoprazole (PROTONIX) 40 MG tablet TAKE 1 TABLET (40 MG TOTAL) BY MOUTH TWICE A DAY BEFORE MEALS 180 tablet 1   SYSTANE COMPLETE 0.6 % SOLN Apply 1 drop to eye 2 (two) times daily.     Tiotropium Bromide Monohydrate (SPIRIVA RESPIMAT) 2.5 MCG/ACT AERS Inhale 2 each into the lungs daily.     Tiotropium Bromide Monohydrate (SPIRIVA RESPIMAT) 2.5 MCG/ACT AERS Inhale 2 puffs into the lungs daily. 12 g 0   ursodiol (ACTIGALL) 300 MG capsule TAKE 1 CAPSULE BY MOUTH EVERY DAY 90 capsule 1   valACYclovir (VALTREX) 500 MG tablet Take 1 tablet (500 mg total) by mouth 2 (two) times daily. (Patient not taking: Reported on 12/20/2022) 6 tablet 0   XIFAXAN 550 MG TABS tablet TAKE 1 TABLET BY MOUTH TWICE A DAY 60 tablet 3   No current facility-administered medications for this visit.    SURGICAL HISTORY:  Past Surgical History:  Procedure Laterality Date   BIOPSY  07/27/2019   Procedure: BIOPSY;  Surgeon: Benancio Deeds, MD;  Location: [facility];  Service: Gastroenterology;;   BREAST BIOPSY  9/03   Right   BREAST LUMPECTOMY Right 1998   CHEST TUBE INSERTION  11/02/2014   COLONOSCOPY     COLONOSCOPY WITH PROPOFOL N/A 07/27/2019   Procedure: COLONOSCOPY WITH PROPOFOL;  Surgeon: Benancio Deeds, MD;  Location: [facility];  Service: Gastroenterology;  Laterality: N/A;   COLONOSCOPY WITH PROPOFOL N/A 07/29/2022   Procedure: COLONOSCOPY WITH PROPOFOL;  Surgeon: Napoleon Form, MD;  Location: [facility];  Service: Gastroenterology;  Laterality: N/A;   ESOPHAGOGASTRODUODENOSCOPY (EGD) WITH PROPOFOL N/A 10/30/2018   Procedure: ESOPHAGOGASTRODUODENOSCOPY (EGD) WITH PROPOFOL;  Surgeon: Napoleon Form, MD;  Location: [facility];  Service: Endoscopy;  Laterality: N/A;   ESOPHAGOGASTRODUODENOSCOPY (EGD) WITH PROPOFOL N/A 03/12/2019   Procedure:  ESOPHAGOGASTRODUODENOSCOPY (EGD) WITH PROPOFOL;  Surgeon: Napoleon Form, MD;  Location: [facility];  Service: Endoscopy;  Laterality: N/A;   ESOPHAGOGASTRODUODENOSCOPY (EGD) WITH PROPOFOL N/A 07/27/2019   Procedure: ESOPHAGOGASTRODUODENOSCOPY (EGD) WITH PROPOFOL;  Surgeon: Benancio Deeds, MD;  Location: [facility];  Service: Gastroenterology;  Laterality: N/A;   EYE SURGERY  02/2017   cataract extraction with lens implant-left   HEMOSTASIS CLIP PLACEMENT  07/29/2022   Procedure: HEMOSTASIS CLIP PLACEMENT;  Surgeon: Napoleon Form, MD;  Location: [facility];  Service: Gastroenterology;;   HOT HEMOSTASIS N/A 07/27/2019   Procedure: HOT HEMOSTASIS (ARGON PLASMA COAGULATION/BICAP);  Surgeon: Benancio Deeds, MD;  Location: [facility];  Service: Gastroenterology;  Laterality: N/A;   MOUTH SURGERY     POLYPECTOMY  07/27/2019   Procedure: POLYPECTOMY;  Surgeon: Benancio Deeds, MD;  Location: [facility];  Service: Gastroenterology;;   POLYPECTOMY  07/29/2022   Procedure: POLYPECTOMY;  Surgeon: Napoleon Form, MD;  Location: [facility];  Service: Gastroenterology;;   TUBAL LIGATION      REVIEW OF SYSTEMS:  A comprehensive review of systems was negative.   PHYSICAL EXAMINATION: General appearance: alert, cooperative, and no distress Head: Normocephalic, without obvious abnormality, atraumatic Neck: no adenopathy, no JVD, supple, symmetrical, trachea midline, and thyroid not enlarged, symmetric, no tenderness/mass/nodules Lymph nodes: Cervical, supraclavicular, and axillary nodes normal. Resp: clear to auscultation bilaterally Back: symmetric, no curvature. ROM normal. No CVA tenderness. Cardio: regular rate and rhythm, S1, S2 normal, no murmur, click, rub or gallop GI: soft, non-tender; bowel sounds normal; no masses,  no organomegaly Extremities: extremities normal, atraumatic, no cyanosis or edema  ECOG PERFORMANCE STATUS: 1 - Symptomatic but completely  ambulatory  Blood pressure 133/62, pulse 77, temperature 98.4 F (36.9 C), temperature source Oral, resp. rate 16, height 5\' 4"  (1.626 m), weight 112 lb 1.6 oz (50.8 kg), SpO2 100%.  LABORATORY DATA: Lab Results  Component Value Date   WBC 6.4 12/16/2022   HGB 12.5 12/16/2022   HCT 37.9 12/16/2022   MCV 94.8 12/16/2022   PLT 247.0 12/16/2022      Chemistry      Component Value Date/Time   NA 133 (L) 12/16/2022 0939   NA 132 (L) 05/26/2021 1147   K 4.6 12/16/2022 0939   CL 99 12/16/2022 0939   CO2 29 12/16/2022 0939   BUN 12 12/16/2022 0939   BUN 21 05/26/2021 1147   CREATININE 0.81 12/16/2022 0939   CREATININE 0.78 12/15/2022 1055      Component Value Date/Time   CALCIUM 9.7 12/16/2022 0939   ALKPHOS 74 12/16/2022 0939   AST 17 12/16/2022 0939   AST 19 12/15/2022 1055   ALT 7 12/16/2022 0939   ALT 7 12/15/2022 1055   BILITOT 0.5 12/16/2022 0939   BILITOT 0.6 12/15/2022 1055       RADIOGRAPHIC STUDIES: CT Chest W Contrast  Result Date: 12/23/2022 CLINICAL DATA:  Non-small cell lung cancer; * Tracking Code: BO * EXAM: CT CHEST WITH CONTRAST TECHNIQUE: Multidetector CT imaging of the chest was performed during intravenous contrast administration. RADIATION DOSE REDUCTION: This exam was performed according to the departmental dose-optimization program which includes automated exposure control, adjustment of the mA and/or kV according to patient size and/or use of iterative reconstruction technique. CONTRAST:  65mL OMNIPAQUE IOHEXOL 300 MG/ML  SOLN COMPARISON:  Chest CT dated May 25, 2022 FINDINGS: Cardiovascular: Normal heart size. No pericardial effusion. Normal caliber thoracic aorta with severe atherosclerotic disease. Severe coronary artery calcifications. Mediastinum/Nodes: Esophagus and thyroid are unremarkable. Surgical clips of the right axilla. No enlarged lymph nodes seen in the chest. Lungs/Pleura: Central airways are patent. Increasing subpleural nodular  opacity of the anterior right upper lobe located adjacent to apical pleural-parenchymal scarring measuring 10 x 6 mm on series 3, image 23, demonstrated hypermetabolic activity on prior PET-CT. Not present on November 20, 2021 prior, similar in overall size when compared with PET-CT dated June 15, 2022 and February 13th 2024 exam, but increased in density. Pleural thickening of the mild subpleural reticulations of the right upper lobe, consistent with postradiation change. Stable bandlike opacity of the right hemithorax located along the major fissure, stable postradiation changes of the adjacent superior portion of the right lower lobe. Stable ground-glass nodule of the left lung apex measuring 6 x 3 mm on series 3, image 17. No pleural effusion. Upper  Abdomen: Cirrhotic liver morphology. Gallstones. No acute abnormality. Musculoskeletal: No chest wall abnormality. No acute or significant osseous findings. IMPRESSION: 1. Increasing subpleural nodular opacity of the anterior right upper lobe located adjacent to apical pleural-parenchymal scarring measuring 10 x 6 mm, demonstrated focal hypermetabolic activity on prior PET-CT. New when compared with November 20, 2021 prior and increased in density when compared with most recent priors. Concerning for metachronous primary lung malignancy. 2. Stable postradiation change of the superior portion of the right lower lobe, including adjacent nodular density located along the major fissure. 3. Stable ground-glass nodule of the left lung apex. 4. Aortic Atherosclerosis (ICD10-I70.0). Electronically Signed   By: Allegra Lai M.D.   On: 12/23/2022 09:23    ASSESSMENT AND PLAN: This is a very pleasant 72 years old white female with iron deficiency anemia secondary to likely chronic blood loss secondary to AV malformation as well as lack of dietary iron supplements.  She has intolerance to the oral iron tablets. She has a history of a stage Ia lung cancer status post SBRT to  the right lower lobe lung nodule. She has no complaints today.  Repeat CT scan of the chest showed increasing subpleural nodular opacity of the anterior right upper lobe located adjacent to the apical pleural-parenchymal scarring measuring 1.0 x 0.6 cm and that had focal hypermetabolic activity on the prior PET scan.  This nodule is new compared to November 20, 2021 and is still concerning for metachronous primary lung malignancy. I had a lengthy discussion with the patient today about her condition and treatment options. I gave her the option of referral to radiation oncology for consideration of SBRT to the suspicious right upper lobe lung nodule versus close monitoring and repeat imaging studies in few months.  The patient is not interested in the radiation treatment at this point and she would like to continue on observation.  I will see her back for follow-up visit in 4 months for evaluation with repeat CT scan of the chest for restaging of her disease. Regarding her anemia she is feeling much better with no evidence of anemia on the recent blood work. The patient was advised to call immediately if she has any other concerning symptoms in the interval.  The patient was advised to call immediately if she has any other concerning symptoms in the interval. The patient voices understanding of current disease status and treatment options and is in agreement with the current care plan. All questions were answered. The patient knows to call the clinic with any problems, questions or concerns. We can certainly see the patient much sooner if necessary.   Disclaimer: This note was dictated with voice recognition software. Similar sounding words can inadvertently be transcribed and may not be corrected upon review.

## 2022-12-29 ENCOUNTER — Encounter: Payer: Self-pay | Admitting: Family Medicine

## 2022-12-29 ENCOUNTER — Ambulatory Visit (INDEPENDENT_AMBULATORY_CARE_PROVIDER_SITE_OTHER): Payer: Medicare HMO | Admitting: Family Medicine

## 2022-12-29 VITALS — BP 108/56 | HR 72 | Temp 97.7°F | Ht 64.5 in | Wt 107.5 lb

## 2022-12-29 DIAGNOSIS — F17200 Nicotine dependence, unspecified, uncomplicated: Secondary | ICD-10-CM

## 2022-12-29 DIAGNOSIS — C3431 Malignant neoplasm of lower lobe, right bronchus or lung: Secondary | ICD-10-CM | POA: Diagnosis not present

## 2022-12-29 DIAGNOSIS — Z0000 Encounter for general adult medical examination without abnormal findings: Secondary | ICD-10-CM | POA: Diagnosis not present

## 2022-12-29 DIAGNOSIS — J432 Centrilobular emphysema: Secondary | ICD-10-CM | POA: Diagnosis not present

## 2022-12-29 DIAGNOSIS — R7303 Prediabetes: Secondary | ICD-10-CM

## 2022-12-29 DIAGNOSIS — K7031 Alcoholic cirrhosis of liver with ascites: Secondary | ICD-10-CM

## 2022-12-29 DIAGNOSIS — E1169 Type 2 diabetes mellitus with other specified complication: Secondary | ICD-10-CM | POA: Diagnosis not present

## 2022-12-29 DIAGNOSIS — E46 Unspecified protein-calorie malnutrition: Secondary | ICD-10-CM | POA: Diagnosis not present

## 2022-12-29 DIAGNOSIS — M8580 Other specified disorders of bone density and structure, unspecified site: Secondary | ICD-10-CM | POA: Diagnosis not present

## 2022-12-29 DIAGNOSIS — E559 Vitamin D deficiency, unspecified: Secondary | ICD-10-CM | POA: Diagnosis not present

## 2022-12-29 DIAGNOSIS — I700 Atherosclerosis of aorta: Secondary | ICD-10-CM

## 2022-12-29 DIAGNOSIS — I8510 Secondary esophageal varices without bleeding: Secondary | ICD-10-CM

## 2022-12-29 DIAGNOSIS — I2510 Atherosclerotic heart disease of native coronary artery without angina pectoris: Secondary | ICD-10-CM

## 2022-12-29 DIAGNOSIS — D500 Iron deficiency anemia secondary to blood loss (chronic): Secondary | ICD-10-CM

## 2022-12-29 DIAGNOSIS — E785 Hyperlipidemia, unspecified: Secondary | ICD-10-CM | POA: Diagnosis not present

## 2022-12-29 DIAGNOSIS — Z1211 Encounter for screening for malignant neoplasm of colon: Secondary | ICD-10-CM

## 2022-12-29 DIAGNOSIS — K5520 Angiodysplasia of colon without hemorrhage: Secondary | ICD-10-CM

## 2022-12-29 NOTE — Assessment & Plan Note (Signed)
Lab Results  Component Value Date   HGBA1C 5.8 12/16/2022   Instructed to hold metformin /appears she no longer needs it  Sent for eye exam Normal foot exam Encouraged more dietary protein  No statin due to cirrhosis  Microalb utd

## 2022-12-29 NOTE — Assessment & Plan Note (Signed)
Back to smoking 1ppd and no interest in quitting Continues pulm care (also for lung cancer)  No change in breathing status per pt  Using spiriva  Prn ventolin

## 2022-12-29 NOTE — Assessment & Plan Note (Signed)
Disc goals for lipids and reasons to control them Rev last labs with pt Rev low sat fat diet in detail  No statin due to cirrhosis Planning visit to cardiology to discuss [id number] LDL 108

## 2022-12-29 NOTE — Progress Notes (Signed)
Subjective:    Patient ID: Christine Barton, female    DOB: 1950-07-08, 72 y.o.   MRN: [id number]  HPI  Here for health maintenance exam and to review chronic medical problems   Wt Readings from Last 3 Encounters:  12/29/22 107 lb 8 oz (48.8 kg)  12/23/22 112 lb 1.6 oz (50.8 kg)  12/20/22 111 lb 12.8 oz (50.7 kg)   18.17 kg/m  Vitals:   12/29/22 1123  BP: (!) 108/56  Pulse: 72  Temp: 97.7 F (36.5 C)  SpO2: 96%     Immunization History  Administered Date(s) Administered   Fluad Quad(high Dose 65+) 12/04/2018, 01/19/2020, 12/23/2021   Fluad Trivalent(High Dose 65+) 12/06/2022   Hep A / Hep B 09/05/2018, 10/10/2018, 03/29/2019   Influenza Split 03/10/2011   Influenza Whole 02/11/2000, 02/06/2016   Influenza, High Dose Seasonal PF 02/03/2021   Influenza,inj,Quad PF,6+ Mos 02/16/2017, 04/18/2018   Influenza-Unspecified 02/11/2016   PFIZER(Purple Top)SARS-COV-2 Vaccination 05/21/2019, 06/16/2019, 02/13/2020   Pfizer Covid-19 Vaccine Bivalent Booster 32yrs & up 02/10/2021   Pneumococcal Conjugate-13 10/26/2016   Pneumococcal Polysaccharide-23 06/28/2008, 11/07/2017   Respiratory Syncytial Virus Vaccine,Recomb Aduvanted(Arexvy) 05/18/2022   Td 05/21/2002   Tdap 09/12/2013   Zoster Recombinant(Shingrix) 06/18/2022   Zoster, Live 08/24/2011    Health Maintenance Due  Topic Date Due   OPHTHALMOLOGY EXAM  12/23/2020   FOOT EXAM  12/24/2022   Medicare Annual Wellness (AWV)  02/24/2023    Eye exam-just had it / sent for report from Dr Earl Gala -had 1st one  Due for 2nd one-will get at [facility] -due now   [facility]ammogram [facility]/2021 [facility]  Wants to stop getting mammograms  If she had breast cancer would not treat  Self breast exam- no lumps   Gyn health - no problems   Smoking status : 1 ppd  Not wanting to quit  Not ready to quit  Continues her lung cancer follow up -continues to observe  Pt declines treatment  Follows up in 4 month    Colon cancer  screening -colonoscopy 07/2022  with 3 y recall Polyps Also tics and hemorrhoids   Bone health  06/2022 dexa    osteopenia  Smoking makes this worse  Falls-none  Fractures-none  Supplements  Last vitamin D Lab Results  Component Value Date   VD25OH 107.36 (HH) 12/16/2022   Exercise : walking  Refuses to do strength training after injuring herself at the [facility]     Questions about advance directive -given paperwork to designate Gwen as POA    Mood    12/29/2022   11:30 AM 12/06/2022   10:07 AM 02/23/2022   12:53 PM 12/31/2021    2:49 PM 09/28/2021   12:21 PM  Depression screen PHQ 2/9  Decreased Interest 0 1 0 3 0  Down, Depressed, Hopeless 0 1 0 3 0  PHQ - 2 Score 0 2 0 6 0  Altered sleeping 0 1 0 1   Tired, decreased energy 0 1 0 0   Change in appetite 2 1 0 0   Feeling bad or failure about yourself  0 1 0 1   Trouble concentrating 0 1 1 1    Moving slowly or fidgety/restless 0 1 0 1   Suicidal thoughts 0 0 0 0   PHQ-9 Score 2 8 1 10    Difficult doing work/chores Not difficult at all Somewhat difficult  Somewhat difficult     Sees psychiatry On pristiq Tranxene  Helping  a lot and feeling better  Struggles to keep up her weight  This is hard for her   2-3 eggs per day  Nutritional drinks (plus) 350 calories    BP Readings from Last 3 Encounters:  12/29/22 (!) 108/56  12/23/22 133/62  12/20/22 120/78   Pulse Readings from Last 3 Encounters:  12/29/22 72  12/23/22 77  12/20/22 73   Lab Results  Component Value Date   NA 133 (L) 12/16/2022   K 4.6 12/16/2022   CO2 29 12/16/2022   GLUCOSE 120 (H) 12/16/2022   BUN 12 12/16/2022   CREATININE 0.81 12/16/2022   CALCIUM 9.7 12/16/2022   GFR 72.73 12/16/2022   EGFR 74 05/26/2021   GFRNONAA >60 12/15/2022    Takes nadolol for esoph varices   Takes protonix  Lab Results  Component Value Date   VITAMINB12 404 12/16/2022   Sees hematology for anemia Lab Results  Component Value Date   WBC 6.4  12/16/2022   HGB 12.5 12/16/2022   HCT 37.9 12/16/2022   MCV 94.8 12/16/2022   PLT 247.0 12/16/2022   Past avm/colon  DM2 Lab Results  Component Value Date   HGBA1C 5.8 12/16/2022   Metformin xr 500 mg daily  Is open to stopping it    History of copd  Also lung cancer   Lab Results  Component Value Date   MICROALBUR 1.2 12/16/2022   MICROALBUR 0.3 11/02/2019       Alcoholic cirrhosis of liver Also gallstones-not a surgical candidate for removal  Lab Results  Component Value Date   ALT 7 12/16/2022   AST 17 12/16/2022   ALKPHOS 74 12/16/2022   BILITOT 0.5 12/16/2022    Lab Results  Component Value Date   LABPROT 11.8 10/09/2021      Hyperlipidemia Lab Results  Component Value Date   CHOL 189 12/16/2022   CHOL 160 12/21/2021   CHOL 142 11/07/2020   Lab Results  Component Value Date   HDL 40.50 12/16/2022   HDL 43.30 12/21/2021   HDL 34.80 (L) 11/07/2020   Lab Results  Component Value Date   LDLCALC 108 (H) 12/16/2022   LDLCALC 94 12/21/2021   LDLCALC 82 11/07/2020   Lab Results  Component Value Date   TRIG 204.0 (H) 12/16/2022   TRIG 110.0 12/21/2021   TRIG 124.0 11/07/2020   Lab Results  Component Value Date   CHOLHDL 5 12/16/2022   CHOLHDL 4 12/21/2021   CHOLHDL 4 11/07/2020   Lab Results  Component Value Date   LDLDIRECT 83.0 10/31/2017   LDLDIRECT 145.1 10/10/2012   LDLDIRECT 151.4 07/21/2011   Repatha was advised  Had appointment with cardiology in December and had to cancel and has to follow up   Has vascular disease/significant   Sister and brother have CAD Sister with CHF     Patient Active Problem List   Diagnosis Date Noted   Current use of proton pump inhibitor 12/14/2022   Osteopenia 06/17/2022   Pulmonary emphysema (HCC) 09/03/2021   Agatston CAC score, >400 07/02/2021   Left-sided chest wall pain 06/04/2021   Lung cancer (HCC) 02/25/2021   Iron deficiency anemia 01/01/2021   Iron deficiency anemia due  to chronic blood loss 11/06/2020   AVM (arteriovenous malformation) of colon    Benign neoplasm of colon    NASH (nonalcoholic steatohepatitis)    Malnutrition (HCC) 07/25/2019   HSV infection 02/23/2019   Aortic atherosclerosis (HCC) 01/22/2019   Coronary atherosclerosis 01/22/2019   Encounter  for screening for lung cancer 01/04/2019   Venous insufficiency 11/15/2018   Esophageal varices without bleeding (HCC) 10/27/2018   Anemia 10/23/2018   Marasmus (HCC) 07/26/2018   Poor balance 05/30/2018   Cirrhosis of liver (HCC) 04/24/2018   Gallstones 04/24/2018   Heme positive stool 04/24/2018   Smoker 11/07/2017   Welcome to Medicare preventive visit 10/26/2016   Estrogen deficiency 10/26/2016   Colon cancer screening 11/01/2014   Encounter for routine gynecological examination 09/12/2013   Left shoulder pain 08/20/2011   Routine general medical examination at a health care facility 07/21/2011   Vitamin D deficiency 08/04/2009   POSTMENOPAUSAL STATUS 08/04/2009   Prediabetes 02/18/2009   Hyperlipidemia associated with type 2 diabetes mellitus (HCC) 06/14/2008   DISC DISEASE, CERVICAL 03/30/2007   History of alcohol abuse 12/06/2006   Neuropathy of both feet 12/06/2006   DIVERTICULOSIS, COLON 12/06/2006   Fatty liver 12/06/2006   BREAST CANCER, HX OF 12/06/2006   Past Medical History:  Diagnosis Date   Alcohol abuse, unspecified    Breast cancer (HCC) 1998   Right   Cataract    left eye   Cervical spondylosis 2006   MRI   Degeneration of cervical intervertebral disc 2006   MRI   Diabetes mellitus without complication (HCC)    Diverticulosis of colon (without mention of hemorrhage)    Hyperpotassemia    Lung cancer (HCC) 03/2021   started radiation in January 2023, right side   Microscopic hematuria    Mononeuritis of unspecified site    Nonspecific abnormal results of liver function study    Other abnormal glucose    Other and unspecified hyperlipidemia    no per pt    Other chronic nonalcoholic liver disease    Personal history of chemotherapy    Personal history of malignant neoplasm of breast    Personal history of radiation therapy    Pneumothorax, acute    right, spontaneous   Tobacco use disorder    Unspecified vitamin D deficiency    Past Surgical History:  Procedure Laterality Date   BIOPSY  07/27/2019   Procedure: BIOPSY;  Surgeon: Benancio Deeds, MD;  Location: [facility];  Service: Gastroenterology;;   BREAST BIOPSY  9/03   Right   BREAST LUMPECTOMY Right 1998   CHEST TUBE INSERTION  11/02/2014   COLONOSCOPY     COLONOSCOPY WITH PROPOFOL N/A 07/27/2019   Procedure: COLONOSCOPY WITH PROPOFOL;  Surgeon: Benancio Deeds, MD;  Location: [facility];  Service: Gastroenterology;  Laterality: N/A;   COLONOSCOPY WITH PROPOFOL N/A 07/29/2022   Procedure: COLONOSCOPY WITH PROPOFOL;  Surgeon: Napoleon Form, MD;  Location: [facility];  Service: Gastroenterology;  Laterality: N/A;   ESOPHAGOGASTRODUODENOSCOPY (EGD) WITH PROPOFOL N/A 10/30/2018   Procedure: ESOPHAGOGASTRODUODENOSCOPY (EGD) WITH PROPOFOL;  Surgeon: Napoleon Form, MD;  Location: [facility];  Service: Endoscopy;  Laterality: N/A;   ESOPHAGOGASTRODUODENOSCOPY (EGD) WITH PROPOFOL N/A 03/12/2019   Procedure: ESOPHAGOGASTRODUODENOSCOPY (EGD) WITH PROPOFOL;  Surgeon: Napoleon Form, MD;  Location: [facility];  Service: Endoscopy;  Laterality: N/A;   ESOPHAGOGASTRODUODENOSCOPY (EGD) WITH PROPOFOL N/A 07/27/2019   Procedure: ESOPHAGOGASTRODUODENOSCOPY (EGD) WITH PROPOFOL;  Surgeon: Benancio Deeds, MD;  Location: [facility];  Service: Gastroenterology;  Laterality: N/A;   EYE SURGERY  02/2017   cataract extraction with lens implant-left   HEMOSTASIS CLIP PLACEMENT  07/29/2022   Procedure: HEMOSTASIS CLIP PLACEMENT;  Surgeon: Napoleon Form, MD;  Location: [facility];  Service: Gastroenterology;;   HOT HEMOSTASIS N/A 07/27/2019  Procedure: HOT  HEMOSTASIS (ARGON PLASMA COAGULATION/BICAP);  Surgeon: Benancio Deeds, MD;  Location: [facility];  Service: Gastroenterology;  Laterality: N/A;   MOUTH SURGERY     POLYPECTOMY  07/27/2019   Procedure: POLYPECTOMY;  Surgeon: Benancio Deeds, MD;  Location: [facility];  Service: Gastroenterology;;   POLYPECTOMY  07/29/2022   Procedure: POLYPECTOMY;  Surgeon: Napoleon Form, MD;  Location: [facility];  Service: Gastroenterology;;   TUBAL LIGATION     Social History   Tobacco Use   Smoking status: Every Day    Current packs/day: 1.00    Average packs/day: 1 pack/day for 58.2 years (58.2 ttl pk-yrs)    Types: Cigarettes    Start date: 09/25/2021    Passive exposure: Past   Smokeless tobacco: Never   Tobacco comments:    Started smoking at 72 years old.     1PPD 12/20/2022 khj  Vaping Use   Vaping status: Never Used  Substance Use Topics   Alcohol use: Not Currently   Drug use: No   Family History  Problem Relation Age of Onset   Heart failure Father    Heart attack Father    Colon cancer Maternal Uncle    Stroke Mother    Esophageal cancer Neg Hx    Rectal cancer Neg Hx    Stomach cancer Neg Hx    Pancreatic cancer Neg Hx    Allergies  Allergen Reactions   Anoro Ellipta [Umeclidinium-Vilanterol] Cough   Bevespi Aerosphere [Glycopyrrolate-Formoterol] Cough    Prolonged coughing episode with first dose   Oxycodone Nausea And Vomiting   Crestor [Rosuvastatin]     Abdominal pain     Glipizide Other (See Comments)    Stomach pain   Current Outpatient Medications on File Prior to Visit  Medication Sig Dispense Refill   acetaminophen (TYLENOL) 500 MG tablet Take 500 mg by mouth every 6 (six) hours as needed for moderate pain.     albuterol (VENTOLIN HFA) 108 (90 Base) MCG/ACT inhaler INHALE 2 PUFFS INTO THE LUNGS EVERY 4 HOURS AS NEEDED FOR WHEEZE OR FOR SHORTNESS OF BREATH 8.5 each 3   cholecalciferol (VITAMIN D3) 25 MCG (1000 UNIT) tablet Take 1,000 Units  by mouth daily.     clorazepate (TRANXENE) 3.75 MG tablet Take 1 tablet (3.75 mg total) by mouth 2 (two) times daily as needed for anxiety. 60 tablet 2   cyclobenzaprine (FLEXERIL) 10 MG tablet TAKE 1/2 TABLET BY MOUTH AT BEDTIME AS NEEDED FOR MUSCLE SPASMS 15 tablet 3   desvenlafaxine (PRISTIQ) 25 MG 24 hr tablet Take 1 tablet (25 mg total) by mouth daily. 30 tablet 1   furosemide (LASIX) 20 MG tablet TAKE 1 TABLET BY MOUTH EVERY DAY 90 tablet 1   lactulose (CHRONULAC) 10 GM/15ML solution Take 15 mLs (10 g total) by mouth 3 (three) times daily. (Patient taking differently: Take 10 g by mouth See admin instructions. Take 10 g daily, may take a second 10 g dose as needed for constipation) 236 mL 11   Multiple Vitamin (MULTIVITAMIN) tablet Take 1 tablet by mouth daily.     Multiple Vitamins-Minerals (PRESERVISION AREDS 2) CAPS Take 1 capsule by mouth 2 (two) times daily.     mupirocin ointment (BACTROBAN) 2 % APPLY 1 APPLICATION TOPICALLY 2 TIMES DAILY AS NEEDED. 15 g 3   nadolol (CORGARD) 20 MG tablet TAKE 1 TABLET BY MOUTH EVERY DAY 90 tablet 3   nystatin (MYCOSTATIN) 100000 UNIT/ML suspension TAKE 5 MLS (500,000 UNITS TOTAL)  BY MOUTH 3 (THREE) TIMES DAILY. SWISH AND SWALLOW 120 mL 0   pantoprazole (PROTONIX) 40 MG tablet TAKE 1 TABLET (40 MG TOTAL) BY MOUTH TWICE A DAY BEFORE MEALS 180 tablet 1   SYSTANE COMPLETE 0.6 % SOLN Apply 1 drop to eye 2 (two) times daily.     Tiotropium Bromide Monohydrate (SPIRIVA RESPIMAT) 2.5 MCG/ACT AERS Inhale 2 puffs into the lungs daily. 12 g 0   ursodiol (ACTIGALL) 300 MG capsule TAKE 1 CAPSULE BY MOUTH EVERY DAY 90 capsule 1   XIFAXAN 550 MG TABS tablet TAKE 1 TABLET BY MOUTH TWICE A DAY 60 tablet 3   No current facility-administered medications on file prior to visit.    Review of Systems  Constitutional:  Positive for appetite change and fatigue. Negative for activity change, fever and unexpected weight change.  HENT:  Negative for congestion, ear pain,  rhinorrhea, sinus pressure and sore throat.   Eyes:  Negative for pain, redness and visual disturbance.  Respiratory:  Negative for cough, shortness of breath and wheezing.   Cardiovascular:  Negative for chest pain and palpitations.  Gastrointestinal:  Negative for abdominal pain, blood in stool, constipation and diarrhea.  Endocrine: Negative for polydipsia and polyuria.  Genitourinary:  Negative for dysuria, frequency and urgency.  Musculoskeletal:  Negative for arthralgias, back pain and myalgias.  Skin:  Negative for pallor and rash.  Allergic/Immunologic: Negative for environmental allergies.  Neurological:  Negative for dizziness, syncope and headaches.  Hematological:  Negative for adenopathy. Does not bruise/bleed easily.  Psychiatric/Behavioral:  Negative for decreased concentration and dysphoric mood. The patient is not nervous/anxious.        Objective:   Physical Exam Constitutional:      General: She is not in acute distress.    Appearance: Normal appearance. She is well-developed.     Comments: Underweight    HENT:     Head: Normocephalic and atraumatic.     Right Ear: Tympanic membrane and ear canal normal.     Left Ear: Tympanic membrane and ear canal normal.     Mouth/Throat:     Mouth: Mucous membranes are moist.  Eyes:     General: No scleral icterus.    Conjunctiva/sclera: Conjunctivae normal.     Pupils: Pupils are equal, round, and reactive to light.  Neck:     Thyroid: No thyromegaly.     Vascular: No carotid bruit or JVD.  Cardiovascular:     Rate and Rhythm: Normal rate and regular rhythm.     Heart sounds: Normal heart sounds.     No gallop.  Pulmonary:     Effort: Pulmonary effort is normal. No respiratory distress.     Breath sounds: Normal breath sounds. No wheezing or rales.     Comments: Diffusely distant bs  Abdominal:     General: There is no distension or abdominal bruit.     Palpations: Abdomen is soft. There is no mass.      Tenderness: There is no abdominal tenderness. There is no guarding or rebound.  Genitourinary:    Comments: Breast exam: No mass, nodules, thickening, tenderness, bulging, retraction, inflamation, nipple discharge or skin changes noted.  No axillary or clavicular LA.     Musculoskeletal:     Cervical back: Normal range of motion and neck supple.     Right lower leg: No edema.     Left lower leg: No edema.     Comments: Mild kyphosis   Lymphadenopathy:  Cervical: No cervical adenopathy.  Skin:    General: Skin is warm and dry.     Coloration: Skin is not jaundiced or pale.     Findings: No bruising or rash.     Comments: Solar lentigines diffusely Also sks  Neurological:     Mental Status: She is alert.     Cranial Nerves: No cranial nerve deficit.     Motor: No weakness.     Coordination: Coordination normal.     Deep Tendon Reflexes: Reflexes are normal and symmetric. Reflexes normal.  Psychiatric:        Mood and Affect: Mood normal.           Assessment & Plan:   Problem List Items Addressed This Visit       Cardiovascular and Mediastinum   Aortic atherosclerosis (HCC)    No symptoms  Blood pressure is controlled  Missed her cardiology follow up to discuss possible PCYK9  Will re schedule that       AVM (arteriovenous malformation) of colon    None noted on recent colonoscopy      Coronary atherosclerosis    Pt missed last cardiology appointment  Will re schedule Planned to discuss PCYK9 inh treatment for lipids       Esophageal varices without bleeding (HCC)    Continues nadolol  Watching for low bp        Respiratory   Lung cancer (HCC)    Pt chooses obv instead of treatment  Reviewed last scan and note from pulmonary   Is not interested in smoking cessation       Pulmonary emphysema (HCC)    Back to smoking 1ppd and no interest in quitting Continues pulm care (also for lung cancer)  No change in breathing status per pt  Using spiriva   Prn ventolin        Digestive   Cirrhosis of liver (HCC)    From past etoh Continues care per GI Nadolol for esoph varicies         Endocrine   Hyperlipidemia associated with type 2 diabetes mellitus (HCC)    Disc goals for lipids and reasons to control them Rev last labs with pt Rev low sat fat diet in detail  No statin due to cirrhosis Planning visit to cardiology to discuss PCYK9 LDL 108        Musculoskeletal and Integument   Osteopenia    Dexa 06/2022 Discussed fall prevention, supplements and exercise for bone density   D level is high- will hold 1 mo ten take 1000 international units daily instead of 2000  Encouraged exercis as tolerated         Other   Anemia    Monitored by hematology  Need to avoid nsaids in light of avm GI bleeding   Today Hb 12.5       Colon cancer screening    Colonoscopy 07/2022 with 3 y recall if well enough      Malnutrition (HCC)    Reviewed sources of protein  Also supplements  Encouraged pt to eat some times even if not hungry       Prediabetes    Lab Results  Component Value Date   HGBA1C 5.8 12/16/2022   Instructed to hold metformin /appears she no longer needs it  Sent for eye exam Normal foot exam Encouraged more dietary protein  No statin due to cirrhosis  Microalb utd        Routine general medical  examination at a health care facility - Primary    Reviewed health habits including diet and exercise and skin cancer prevention Reviewed appropriate screening tests for age  Also reviewed health mt list, fam hx and immunization status , as well as social and family history   See HPI Labs reviewed and ordered Sent for last eye exam report  Mammogram =declines/ would not treat if positive  Discussed smoking cessation/ not interested  Colonoscopy 07/2022 with 3 y recall if well enough  Dexa 06/2022  Discussed fall prevention, supplements and exercise for bone density  PHQ improved at 2 , under psychiatric  care with pristiq and tranxene       Smoker    Disc in detail risks of smoking and possible outcomes including copd, vascular/ heart disease, cancer , respiratory and sinus infections  Pt voices understanding Has lung cancer Also copd Unfort -not interested in quitting       Vitamin D deficiency    Last vitamin D Lab Results  Component Value Date   VD25OH 107.36 (HH) 12/16/2022   This is too high Will hold 1 mo Then cut dose to 1000 international units daily

## 2022-12-29 NOTE — Assessment & Plan Note (Signed)
Monitored by hematology  Need to avoid nsaids in light of avm GI bleeding   Today Hb 12.5

## 2022-12-29 NOTE — Patient Instructions (Addendum)
Work on the paperwork for power of attorney  Get it notarized  Bring it back when convenient   Get your 2nd shingles shot and get [place] a copy   Hold vitamin D for a month  Then return to it at 1000 international units daily    Try to keep up with regular meals  Protein is important   The following are examples of protein in diet  Meat  Fish  Eggs  Dairy products  Soy products  Oat milk  Almond milk Nuts and nut butters  Dried beans   Protein shakes are also ok   Make yourself eat when you can    I don't think you need the metformin any more  Stop it  We will continue to monitor you   Call cardiology about the cholesterol medicine since you had to cancel your last appointment

## 2022-12-29 NOTE — Assessment & Plan Note (Signed)
None noted on recent colonoscopy

## 2022-12-29 NOTE — Assessment & Plan Note (Signed)
Colonoscopy 07/2022 with 3 y recall if well enough

## 2022-12-29 NOTE — Assessment & Plan Note (Signed)
Pt missed last cardiology appointment  Will re schedule Planned to discuss PCYK9 inh treatment for lipids

## 2022-12-29 NOTE — Assessment & Plan Note (Signed)
Reviewed health habits including diet and exercise and skin cancer prevention Reviewed appropriate screening tests for age  Also reviewed health mt list, fam hx and immunization status , as well as social and family history   See HPI Labs reviewed and ordered Sent for last eye exam report  Mammogram =declines/ would not treat if positive  Discussed smoking cessation/ not interested  Colonoscopy 07/2022 with 3 y recall if well enough  Dexa 06/2022  Discussed fall prevention, supplements and exercise for bone density  PHQ improved at 2 , under psychiatric care with pristiq and tranxene

## 2022-12-29 NOTE — Assessment & Plan Note (Signed)
Last vitamin D Lab Results  Component Value Date   VD25OH 107.36 (HH) 12/16/2022   This is too high Will hold 1 mo Then cut dose to 1000 international units daily

## 2022-12-29 NOTE — Assessment & Plan Note (Signed)
Reviewed sources of protein  Also supplements  Encouraged pt to eat some times even if not hungry

## 2022-12-29 NOTE — Assessment & Plan Note (Signed)
Pt chooses obv instead of treatment  Reviewed last scan and note from pulmonary   Is not interested in smoking cessation

## 2022-12-29 NOTE — Assessment & Plan Note (Signed)
Disc in detail risks of smoking and possible outcomes including copd, vascular/ heart disease, cancer , respiratory and sinus infections  Pt voices understanding Has lung cancer Also copd Unfort -not interested in quitting

## 2022-12-29 NOTE — Assessment & Plan Note (Signed)
No symptoms  Blood pressure is controlled  Missed her cardiology follow up to discuss possible PCYK9  Will re schedule that

## 2022-12-29 NOTE — Assessment & Plan Note (Signed)
From past etoh Continues care per GI Nadolol for esoph varicies

## 2022-12-29 NOTE — Assessment & Plan Note (Signed)
Dexa 06/2022 Discussed fall prevention, supplements and exercise for bone density   D level is high- will hold 1 mo ten take 1000 international units daily instead of 2000  Encouraged exercis as tolerated

## 2022-12-29 NOTE — Assessment & Plan Note (Signed)
Continues nadolol  Watching for low bp

## 2023-01-06 ENCOUNTER — Telehealth: Payer: Self-pay | Admitting: Gastroenterology

## 2023-01-06 NOTE — Telephone Encounter (Signed)
Last office visit was March of this year. Appointment for 01/13/23 offered and accepted. Appointment time 10:10 am.

## 2023-01-06 NOTE — Telephone Encounter (Signed)
Inbound call from patient stating she was expecting to be scheduled for a follow up visit with Dr. Lavon Paganini. Advised she has not been scheduled for a follow up visit and does not have a recall. Patient requesting to speak further with [facility]. Please advise, thank you.

## 2023-01-07 ENCOUNTER — Other Ambulatory Visit: Payer: Self-pay | Admitting: Adult Health

## 2023-01-11 NOTE — Progress Notes (Signed)
Christine Barton    [id number]    [birth year]  Primary Care Physician:Tower, Audrie Gallus, MD  Referring Physician: Tower, Audrie Gallus, MD [address] [place],  [place] [address]   Chief complaint:   Chief Complaint  Patient presents with   Cirrhosis    No complaints today   HPI:72 year old very pleasant female with history of breast cancer, lung cancer, type 2 diabetes, COPD, chronic anemia secondary to occult GI blood loss and decompensated cirrhosis here for follow-up visit  I last saw her on 06-23-22.  Today, she reports feeling well overall with no GI complains. She was recently taken off metformin as she longer needs it. She does report experiencing intermittent lower back pain. She does report drinking fruit juice about twice a day and coffee but she denies drinking any sodas.  She reports compliance with lactulose once daily and reports having at least 2 BM a day. She is also compliant with Xifaxan 500 mg daily and Pantoprazole twice daily. She also reports taking Lasix 20 mg once daily.  Patient denies any edema, diarrhea, constipation, nausea, blood in stool, black stool, vomiting, abdominal pain, bloating, unintentional weight loss, reflux, dysphagia.  She continues to smoke cigarettes but states that she had decreased her alcohol intake, her last drink was in August.  GI Hx:  Colonoscopy 07-29-22 - Hemorrhoids found on perianal exam.  - Five 4 to 7 mm polyps in the transverse colon, in the ascending colon and in the cecum, removed with a cold snare. Resected and retrieved. Clips were placed. Clip manufacturer: [employer].  - Diverticulosis in the sigmoid colon, in the descending colon, in the transverse colon and in the ascending colon.  - Non-bleeding internal hemorrhoids. A. COLON, CECUM, ASCENDING, POLYPECTOMY:  - Tubular adenoma (one fragment)  - Sessile serrated polyp without cytologic dysplasia (one fragment)  - Colonic mucosa with surface  hyperplastic changes (multiple fragments)  - Negative for high-grade dysplasia or malignancy   B. COLON, TRANSVERSE, POLYPECTOMY:  - Tubular adenoma(s) (two fragments)  - Negative for high-grade dysplasia or malignancy   MR abdomen w wo contrast 01-12-22 1. No definite evidence of metastatic disease in the abdomen. 2. Advanced cirrhosis with developing hepatic fibrosis in a background of hepatic iron deposition. 3. Cholelithiasis without evidence of acute cholecystitis. 4. Chronic dilatation of the common bile duct. No choledocholithiasis or definite findings to suggest obstruction.  MRI abdomen/liver December 31, 2020 1. Cirrhosis. No liver masses. 2. Trace perihepatic ascites.  Normal size spleen. 3. Cholelithiasis. Chronic mild central intrahepatic and extrahepatic biliary ductal dilatation, unchanged. CBD diameter 9 mm. No evidence of choledocholithiasis. 4. Chronic mild dilation of the dorsal pancreatic duct in the pancreatic head, unchanged, nonspecific. No pancreatic mass.   Abdominal ultrasound April 30, 2020 1. Cirrhotic liver morphology.  No focal liver lesion is identified. 2. Cholelithiasis with a positive sonographic Murphy sign. Findings raise suspicion for acute cholecystitis.   Normal AFP 3.7 in January 2022 MRI liver 01-08-2020: Stable cirrhosis with no evidence of hepatocellular carcinoma. Choledocholithiasis with stable CBD. Increased iron deposition in the setting of IV iron therapy   EGD July 27, 2019 by Dr. Adela Lank: Small esophageal varices and portal hypertensive gastropathy otherwise normal exam   Colonoscopy July 27, 2019: 8 small AVMs scattered in the colon ablated with APC and 3 small polyps removed, diverticulosis and internal hemorrhoids   EGD February 23, 2019: Small grade 1 esophageal varices, portal hypertensive gastropathy  Colonoscopy November 30, 2016: Diverticulosis, sessile polyps X4 tubular adenomas and internal hemorrhoids.  Recall  colonoscopy in 3 years        Current Outpatient Medications:    acetaminophen (TYLENOL) 500 MG tablet, Take 500 mg by mouth every 6 (six) hours as needed for moderate pain., Disp: , Rfl:    albuterol (VENTOLIN HFA) 108 (90 Base) MCG/ACT inhaler, INHALE 2 PUFFS INTO THE LUNGS EVERY 4 HOURS AS NEEDED FOR WHEEZE OR FOR SHORTNESS OF BREATH, Disp: 8.5 each, Rfl: 3   cholecalciferol (VITAMIN D3) 25 MCG (1000 UNIT) tablet, Take 1,000 Units by mouth daily., Disp: , Rfl:    clorazepate (TRANXENE) 3.75 MG tablet, Take 1 tablet (3.75 mg total) by mouth 2 (two) times daily as needed for anxiety., Disp: 60 tablet, Rfl: 2   cyclobenzaprine (FLEXERIL) 10 MG tablet, TAKE 1/2 TABLET BY MOUTH AT BEDTIME AS NEEDED FOR MUSCLE SPASMS, Disp: 15 tablet, Rfl: 3   desvenlafaxine (PRISTIQ) 25 MG 24 hr tablet, TAKE 1 TABLET (25 MG TOTAL) BY MOUTH DAILY., Disp: 90 tablet, Rfl: 0   furosemide (LASIX) 20 MG tablet, TAKE 1 TABLET BY MOUTH EVERY DAY, Disp: 90 tablet, Rfl: 1   lactulose (CHRONULAC) 10 GM/15ML solution, Take 15 mLs (10 g total) by mouth 3 (three) times daily. (Patient taking differently: Take 10 g by mouth See admin instructions. Take 10 g daily, may take a second 10 g dose as needed for constipation), Disp: 236 mL, Rfl: 11   Multiple Vitamin (MULTIVITAMIN) tablet, Take 1 tablet by mouth daily., Disp: , Rfl:    Multiple Vitamins-Minerals (PRESERVISION AREDS 2) CAPS, Take 1 capsule by mouth 2 (two) times daily., Disp: , Rfl:    mupirocin ointment (BACTROBAN) 2 %, APPLY 1 APPLICATION TOPICALLY 2 TIMES DAILY AS NEEDED., Disp: 15 g, Rfl: 3   nadolol (CORGARD) 20 MG tablet, TAKE 1 TABLET BY MOUTH EVERY DAY, Disp: 90 tablet, Rfl: 3   nystatin (MYCOSTATIN) 100000 UNIT/ML suspension, TAKE 5 MLS (500,000 UNITS TOTAL) BY MOUTH 3 (THREE) TIMES DAILY. SWISH AND SWALLOW, Disp: 120 mL, Rfl: 0   pantoprazole (PROTONIX) 40 MG tablet, TAKE 1 TABLET (40 MG TOTAL) BY MOUTH TWICE A DAY BEFORE MEALS, Disp: 180 tablet, Rfl: 1    SYSTANE COMPLETE 0.6 % SOLN, Apply 1 drop to eye 2 (two) times daily., Disp: , Rfl:    Tiotropium Bromide Monohydrate (SPIRIVA RESPIMAT) 2.5 MCG/ACT AERS, Inhale 2 puffs into the lungs daily., Disp: 12 g, Rfl: 0   ursodiol (ACTIGALL) 300 MG capsule, TAKE 1 CAPSULE BY MOUTH EVERY DAY, Disp: 90 capsule, Rfl: 1   XIFAXAN 550 MG TABS tablet, TAKE 1 TABLET BY MOUTH TWICE A DAY, Disp: 60 tablet, Rfl: 3    Allergies as of 01/13/2023 - Review Complete 01/13/2023  Allergen Reaction Noted   Anoro ellipta [umeclidinium-vilanterol] Cough 08/03/2022   Bevespi aerosphere [glycopyrrolate-formoterol] Cough 08/03/2022   Oxycodone Nausea And Vomiting 11/02/2014   Crestor [rosuvastatin]  11/12/2020   Glipizide Other (See Comments) 09/12/2013    Past Medical History:  Diagnosis Date   Alcohol abuse, unspecified    Breast cancer (HCC) 1998   Right   Cataract    left eye   Cervical spondylosis 2006   MRI   Degeneration of cervical intervertebral disc 2006   MRI   Diabetes mellitus without complication (HCC)    Diverticulosis of colon (without mention of hemorrhage)    Hyperpotassemia    Lung cancer (HCC) 03/2021   started radiation in January 2023, right  side   Microscopic hematuria    Mononeuritis of unspecified site    Nonspecific abnormal results of liver function study    Other abnormal glucose    Other and unspecified hyperlipidemia    no per pt   Other chronic nonalcoholic liver disease    Personal history of chemotherapy    Personal history of malignant neoplasm of breast    Personal history of radiation therapy    Pneumothorax, acute    right, spontaneous   Tobacco use disorder    Unspecified vitamin D deficiency     Past Surgical History:  Procedure Laterality Date   BIOPSY  07/27/2019   Procedure: BIOPSY;  Surgeon: Benancio Deeds, MD;  Location: [facility];  Service: Gastroenterology;;   BREAST BIOPSY  9/03   Right   BREAST LUMPECTOMY Right 1998   CHEST TUBE  INSERTION  11/02/2014   COLONOSCOPY     COLONOSCOPY WITH PROPOFOL N/A 07/27/2019   Procedure: COLONOSCOPY WITH PROPOFOL;  Surgeon: Benancio Deeds, MD;  Location: [facility];  Service: Gastroenterology;  Laterality: N/A;   COLONOSCOPY WITH PROPOFOL N/A 07/29/2022   Procedure: COLONOSCOPY WITH PROPOFOL;  Surgeon: Napoleon Form, MD;  Location: [facility];  Service: Gastroenterology;  Laterality: N/A;   ESOPHAGOGASTRODUODENOSCOPY (EGD) WITH PROPOFOL N/A 10/30/2018   Procedure: ESOPHAGOGASTRODUODENOSCOPY (EGD) WITH PROPOFOL;  Surgeon: Napoleon Form, MD;  Location: [facility];  Service: Endoscopy;  Laterality: N/A;   ESOPHAGOGASTRODUODENOSCOPY (EGD) WITH PROPOFOL N/A 03/12/2019   Procedure: ESOPHAGOGASTRODUODENOSCOPY (EGD) WITH PROPOFOL;  Surgeon: Napoleon Form, MD;  Location: [facility];  Service: Endoscopy;  Laterality: N/A;   ESOPHAGOGASTRODUODENOSCOPY (EGD) WITH PROPOFOL N/A 07/27/2019   Procedure: ESOPHAGOGASTRODUODENOSCOPY (EGD) WITH PROPOFOL;  Surgeon: Benancio Deeds, MD;  Location: [facility];  Service: Gastroenterology;  Laterality: N/A;   EYE SURGERY  02/2017   cataract extraction with lens implant-left   HEMOSTASIS CLIP PLACEMENT  07/29/2022   Procedure: HEMOSTASIS CLIP PLACEMENT;  Surgeon: Napoleon Form, MD;  Location: [facility];  Service: Gastroenterology;;   HOT HEMOSTASIS N/A 07/27/2019   Procedure: HOT HEMOSTASIS (ARGON PLASMA COAGULATION/BICAP);  Surgeon: Benancio Deeds, MD;  Location: [facility];  Service: Gastroenterology;  Laterality: N/A;   MOUTH SURGERY     POLYPECTOMY  07/27/2019   Procedure: POLYPECTOMY;  Surgeon: Benancio Deeds, MD;  Location: [facility];  Service: Gastroenterology;;   POLYPECTOMY  07/29/2022   Procedure: POLYPECTOMY;  Surgeon: Napoleon Form, MD;  Location: [facility];  Service: Gastroenterology;;   TUBAL LIGATION      Family History  Problem Relation Age of Onset   Heart failure Father     Heart attack Father    Colon cancer Maternal Uncle    Stroke Mother    Esophageal cancer Neg Hx    Rectal cancer Neg Hx    Stomach cancer Neg Hx    Pancreatic cancer Neg Hx     Social History   Socioeconomic History   Marital status: Single    Spouse name: Not on file   Number of children: 1   Years of education: Not on file   Highest education level: Not on file  Occupational History   Occupation: retired    Associate Professor: [employer]  Tobacco Use   Smoking status: Every Day    Current packs/day: 1.00    Average packs/day: 1 pack/day for 58.2 years (58.2 ttl pk-yrs)    Types: Cigarettes    Start date: 09/25/2021    Passive exposure: Past  Smokeless tobacco: Never   Tobacco comments:    Started smoking at 73 years old.     1PPD 12/20/2022 khj  Vaping Use   Vaping status: Never Used  Substance and Sexual Activity   Alcohol use: Not Currently   Drug use: No   Sexual activity: Not Currently  Other Topics Concern   Not on file  Social History Narrative   Divorced      1 child      Works at [employer]         Social Determinants of Health   Financial Resource Strain: Low Risk  (11/07/2020)   Overall Financial Resource Strain (CARDIA)    Difficulty of Paying Living Expenses: Not hard at all  Food Insecurity: No Food Insecurity (12/31/2021)   Hunger Vital Sign    Worried About Running Out of Food in the Last Year: Never true    Ran Out of Food in the Last Year: Never true  Transportation Needs: No Transportation Needs (12/31/2021)   PRAPARE - Administrator, Civil Service (Medical): No    Lack of Transportation (Non-Medical): No  Physical Activity: Inactive (11/07/2020)   Exercise Vital Sign    Days of Exercise per Week: 0 days    Minutes of Exercise per Session: 0 min  Stress: No Stress Concern Present (11/07/2020)   [employer] of Occupational Health - Occupational Stress Questionnaire    Feeling of Stress : Not at all  Social  Connections: Not on file  Intimate Partner Violence: Not At Risk (11/07/2020)   Humiliation, Afraid, Rape, and Kick questionnaire    Fear of Current or Ex-Partner: No    Emotionally Abused: No    Physically Abused: No    Sexually Abused: No     Review of systems: Review of Systems  Constitutional:  Negative for unexpected weight change.  HENT:  Negative for trouble swallowing.   Gastrointestinal:  Negative for abdominal distention, abdominal pain, anal bleeding, blood in stool, constipation, diarrhea, nausea, rectal pain and vomiting.  Musculoskeletal:  Positive for back pain.    Physical Exam: General: well-appearing   Eyes: sclera anicteric, no redness ENT: oral mucosa moist without lesions, no cervical or supraclavicular lymphadenopathy CV:  no JVD, no peripheral edema, crackles Resp: clear to auscultation bilaterally, normal RR and effort noted GI: soft, no tenderness, with active bowel sounds. No guarding or palpable organomegaly noted. Skin; warm and dry, no rash or jaundice noted Neuro: awake, alert and oriented x 3. Normal gross motor function and fluent speech .   Data Reviewed:  Reviewed labs, radiology imaging, old records and pertinent past GI work up   Assessment and Plan/Recommendations:  72 year old very pleasant female with history of NASH and EtOH cirrhosis  MELD  Small esophageal varices on nadolol 20 mg daily for secondary prophylaxis  No significant ascites or volume overload on exam: Advised patient to stop Lasix  No asterixis. Hepatic encephalopathy stable: Continue Xifaxan.  Advised patient to hold lactulose while she is having diarrhea  No clinical evidence of hepatic decompensation Overall stable from cirrhosis standpoint   Epigastric abdominal pain: Secondary to large symptomatic gallstone, she is not a surgical candidate Continue ursodiol 300 mg daily monitor symptoms Decrease pantoprazole to 40 mg once daily   Iron deficiency anemia:  Improved with IV iron infusion Chronic GI blood loss secondary to small bowel AVMs Continue to monitor CBC, iron panel and IV iron infusion as needed.  She is being managed by hematology  HCC screening: No lesions concerning for [facility] on recent MRI.    Continue to abstain from EtOH Discussed smoking cessation  Protein calorie malnutrition: Advised patient to eat small frequent meals with high-protein diet  Return in 3 months or sooner if needed   The patient was provided an opportunity to ask questions and all were answered. The patient agreed with the plan and demonstrated an understanding of the instructions.   I,Safa M Kadhim,acting as a scribe for Marsa Aris, MD.,have documented all relevant documentation on the behalf of Marsa Aris, MD,as directed by  Marsa Aris, MD while in the presence of Marsa Aris, MD.   I, Marsa Aris, MD, have reviewed all documentation for this visit. The documentation on 01/13/23 for the exam, diagnosis, procedures, and orders are all accurate and complete.   Iona Beard , MD    CC: Tower, Audrie Gallus, MD

## 2023-01-13 ENCOUNTER — Encounter: Payer: Self-pay | Admitting: Gastroenterology

## 2023-01-13 ENCOUNTER — Ambulatory Visit: Payer: Medicare HMO | Admitting: Gastroenterology

## 2023-01-13 VITALS — BP 122/54 | HR 76 | Ht 64.0 in | Wt 113.5 lb

## 2023-01-13 DIAGNOSIS — K7030 Alcoholic cirrhosis of liver without ascites: Secondary | ICD-10-CM

## 2023-01-13 DIAGNOSIS — K5520 Angiodysplasia of colon without hemorrhage: Secondary | ICD-10-CM

## 2023-01-13 DIAGNOSIS — K7682 Hepatic encephalopathy: Secondary | ICD-10-CM

## 2023-01-13 DIAGNOSIS — D508 Other iron deficiency anemias: Secondary | ICD-10-CM

## 2023-01-13 DIAGNOSIS — I8500 Esophageal varices without bleeding: Secondary | ICD-10-CM | POA: Diagnosis not present

## 2023-01-13 MED ORDER — PANTOPRAZOLE SODIUM 40 MG PO TBEC: 40.0000 mg | DELAYED_RELEASE_TABLET | Freq: Every day | ORAL | 3 refills | Status: DC

## 2023-01-13 MED ORDER — RIFAXIMIN 550 MG PO TABS: 550.0000 mg | ORAL_TABLET | Freq: Two times a day (BID) | ORAL | 3 refills | Status: DC

## 2023-01-13 MED ORDER — URSODIOL 300 MG PO CAPS: ORAL_CAPSULE | ORAL | 3 refills | Status: DC

## 2023-01-13 MED ORDER — LACTULOSE 10 GM/15ML PO SOLN: 10.0000 g | ORAL | 2 refills | Status: DC

## 2023-01-13 MED ORDER — NADOLOL 20 MG PO TABS: 20.0000 mg | ORAL_TABLET | Freq: Every day | ORAL | 3 refills | Status: DC

## 2023-01-13 NOTE — Patient Instructions (Addendum)
Follow up in 3 months  Discontinue Lasix   Decrease Pantoprazole 40 mg daily  We have sent the following medications to your pharmacy for you to pick up at your convenience: Nadolol Urso  Lactulose  Xifaxian   _______________________________________________________  If your blood pressure at your visit was 140/90 or greater, please contact your primary care physician to follow up on this.  _______________________________________________________  If you are age 59 or older, your body mass index should be between 23-30. Your Body mass index is 19.48 kg/m. If this is out of the aforementioned range listed, please consider follow up with your Primary Care Provider.  If you are age 12 or younger, your body mass index should be between 19-25. Your Body mass index is 19.48 kg/m. If this is out of the aformentioned range listed, please consider follow up with your Primary Care Provider.   ________________________________________________________  The [facility] providers would like to encourage you to use [facility] to communicate with providers for non-urgent requests or questions.  Due to long hold times on the telephone, sending your provider a message by [facility] may be a faster and more efficient way to get a response.  Please allow 48 business hours for a response.  Please remember that this is for non-urgent requests.  _______________________________________________________   I appreciate the  opportunity to care for you  Thank You   Marsa Aris , MD

## 2023-01-25 ENCOUNTER — Telehealth: Payer: Self-pay | Admitting: Family Medicine

## 2023-01-25 ENCOUNTER — Encounter: Payer: Self-pay | Admitting: Gastroenterology

## 2023-01-25 ENCOUNTER — Telehealth: Payer: Self-pay | Admitting: Gastroenterology

## 2023-01-25 DIAGNOSIS — Z5941 Food insecurity: Secondary | ICD-10-CM | POA: Insufficient documentation

## 2023-01-25 MED ORDER — RIFAXIMIN 550 MG PO TABS: 550.0000 mg | ORAL_TABLET | Freq: Two times a day (BID) | ORAL | 3 refills | Status: DC

## 2023-01-25 NOTE — Assessment & Plan Note (Signed)
[name] daughter let us know today that her funds are not enough to buy needed food  She was told that her income from ss does not qualify her for help like meals on wheels   I told her daughter I would consult community care

## 2023-01-25 NOTE — Telephone Encounter (Signed)
Patent called requesting a refill on Xifaxan.

## 2023-01-25 NOTE — Telephone Encounter (Signed)
[name] daughter Dedra Skeens alerted me re: issues affording food  I put in a community care order for food insecurity   Please let pt and daughter know to expect a call and hope they can help

## 2023-01-25 NOTE — Telephone Encounter (Signed)
Rx for Xifaxan sent to pharmacy as requested.

## 2023-01-26 ENCOUNTER — Telehealth: Payer: Self-pay | Admitting: Pharmacy Technician

## 2023-01-26 ENCOUNTER — Telehealth: Payer: Self-pay | Admitting: *Deleted

## 2023-01-26 ENCOUNTER — Other Ambulatory Visit (HOSPITAL_COMMUNITY): Payer: Self-pay

## 2023-01-26 NOTE — Telephone Encounter (Signed)
They have already reached out to pt and discuss this with her directly

## 2023-01-26 NOTE — Progress Notes (Signed)
Care Coordination   Note   01/26/2023 Name: Christine Barton MRN: [id number] DOB: [birth year]  Christine Barton is a 72 y.o. year old female who sees Tower, Audrie Gallus, MD for primary care. I reached out to [name] by phone today to offer care coordination services.  [name] was given information about Care Coordination services today including:   The Care Coordination services include support from the care team which includes your Nurse Coordinator, Clinical Social Worker, or Pharmacist.  The Care Coordination team is here to help remove barriers to the health concerns and goals most important to you. Care Coordination services are voluntary, and the patient may decline or stop services at any time by request to their care team member.   Care Coordination Consent Status: Patient agreed to services and verbal consent obtained.   Follow up plan:  Telephone appointment with care coordination team member scheduled for:  01/31/2023  Encounter Outcome:  Patient Scheduled from referral   Burman Nieves, [facility] Care Coordination Care Guide Direct Dial: [phone]

## 2023-01-26 NOTE — Telephone Encounter (Signed)
Pharmacy Patient Advocate Encounter   Received notification from Physician's Office that prior authorization for XIFAXAN 550MG  is required/requested.   Insurance verification completed.   The patient is insured through [facility] .   Per test claim: PA required; PA submitted to [facility] via CoverMyMeds Key/confirmation #/EOC [id number] Status is pending

## 2023-01-26 NOTE — Telephone Encounter (Signed)
Pharmacy Patient Advocate Encounter  Received notification from [facility] that Prior Authorization for XIFAXAN 550MG  has been APPROVED from 10.16.24 to 4.14.25. Ran test claim, Copay is $0. This test claim was processed through [facility]- copay amounts may vary at other pharmacies due to pharmacy/plan contracts, or as the patient moves through the different stages of their insurance plan.   PA #/Case ID/Reference #:

## 2023-01-26 NOTE — Telephone Encounter (Signed)
Raiford Noble, I noticed you actually got this medication approved for this patient in March but it expired in September. Do you still work with the Rx team?

## 2023-01-26 NOTE — Telephone Encounter (Signed)
Patient aware that PA team has been sent the information.  I sent a secure message in EPIC to [employer] with this patients information.  Dr Elana Alm team is aware.

## 2023-01-26 NOTE — Telephone Encounter (Signed)
Patient calling in regards to previous note. Please advise.   States pharmacy is not releasing prescription.

## 2023-01-31 ENCOUNTER — Encounter: Payer: Self-pay | Admitting: Family Medicine

## 2023-01-31 ENCOUNTER — Ambulatory Visit: Payer: Medicare HMO | Admitting: Family Medicine

## 2023-01-31 ENCOUNTER — Ambulatory Visit: Payer: Self-pay

## 2023-01-31 VITALS — BP 128/64 | HR 80 | Temp 98.4°F | Ht 64.0 in | Wt 113.0 lb

## 2023-01-31 DIAGNOSIS — S61211A Laceration without foreign body of left index finger without damage to nail, initial encounter: Secondary | ICD-10-CM | POA: Insufficient documentation

## 2023-01-31 DIAGNOSIS — S51811A Laceration without foreign body of right forearm, initial encounter: Secondary | ICD-10-CM | POA: Diagnosis not present

## 2023-01-31 DIAGNOSIS — Z23 Encounter for immunization: Secondary | ICD-10-CM | POA: Diagnosis not present

## 2023-01-31 MED ORDER — CEPHALEXIN 500 MG PO CAPS: 500.0000 mg | ORAL_CAPSULE | Freq: Three times a day (TID) | ORAL | 0 refills | Status: DC

## 2023-01-31 NOTE — Patient Instructions (Signed)
Visit Information  Thank you for taking time to visit with me today. Please don't hesitate to contact me if I can be of assistance to you.   Following are the goals we discussed today:  Patient will contact [facility] for the Home Rehab Program Patient will contact DSS to apply for Foodstamps and [employer] program (in December). Patient will contact food banks in the community.   Our next appointment is by telephone on 03/03/23 at 1pm  Please call the care guide team at [phone] if you need to cancel or reschedule your appointment.   If you are experiencing a Mental Health or Behavioral Health Crisis or need someone to talk to, please call 911  Patient verbalizes understanding of instructions and care plan provided today and agrees to view in MyChart. Active MyChart status and patient understanding of how to access instructions and care plan via MyChart confirmed with patient.     Telephone follow up appointment with care management team member scheduled for: 03/03/23 at 1pm  Lysle Morales, BSW Social Worker [phone]

## 2023-01-31 NOTE — Progress Notes (Signed)
Ph: [phone] Fax: [phone]   Patient ID: Christine Barton, female    DOB: Oct 09, 1950, 72 y.o.   MRN: [id number]  This visit was conducted in person.  BP 128/64   Pulse 80   Temp 98.4 F (36.9 C) (Oral)   Ht 5\' 4"  (1.626 m)   Wt 113 lb (51.3 kg)   LMP  (LMP Unknown)   SpO2 99%   BMI 19.40 kg/m    CC: check wound Subjective:   HPI: Christine Barton is a 72 y.o. female presenting on 01/31/2023 for Fall (Pt fell 01/28/23 at home. C/o abrasion to posterior R wrist. Also, pt wants abd checked for excess fluid due to recently stopping Lasix. )   DOI: Friday evening 01/28/2023 Tripped on foot stool at home - resultant skin tear to posterior R wrist as well as skin tear/laceration to left index finger. Did not seek care.  Washed with soap/warm water, followed by hydrogen peroxide and neosporin, covered with gauze.   Overall feeling well. No fevers/chills, nausea, streaking redness, new joint pain.  Last Tdap 09/2013. Agrees to repeat Td.   Lasix recently stopped by Dr Lavon Paganini - she was worried about fluid reaccumulation. H/o cirrhosis.      Relevant past medical, surgical, family and social history reviewed and updated as indicated. Interim medical history since our last visit reviewed. Allergies and medications reviewed and updated. Outpatient Medications Prior to Visit  Medication Sig Dispense Refill   acetaminophen (TYLENOL) 500 MG tablet Take 500 mg by mouth every 6 (six) hours as needed for moderate pain.     albuterol (VENTOLIN HFA) 108 (90 Base) MCG/ACT inhaler INHALE 2 PUFFS INTO THE LUNGS EVERY 4 HOURS AS NEEDED FOR WHEEZE OR FOR SHORTNESS OF BREATH 8.5 each 3   cholecalciferol (VITAMIN D3) 25 MCG (1000 UNIT) tablet Take 1,000 Units by mouth daily.     clorazepate (TRANXENE) 3.75 MG tablet Take 1 tablet (3.75 mg total) by mouth 2 (two) times daily as needed for anxiety. 60 tablet 2   cyclobenzaprine (FLEXERIL) 10 MG tablet TAKE 1/2 TABLET BY MOUTH AT BEDTIME AS  NEEDED FOR MUSCLE SPASMS 15 tablet 3   desvenlafaxine (PRISTIQ) 25 MG 24 hr tablet TAKE 1 TABLET (25 MG TOTAL) BY MOUTH DAILY. 90 tablet 0   lactulose (CHRONULAC) 10 GM/15ML solution Take 15 mLs (10 g total) by mouth See admin instructions. Take 10 g daily, may take a second 10 g dose as needed for constipation 1000 mL 2   Multiple Vitamin (MULTIVITAMIN) tablet Take 1 tablet by mouth daily.     Multiple Vitamins-Minerals (PRESERVISION AREDS 2) CAPS Take 1 capsule by mouth 2 (two) times daily.     mupirocin ointment (BACTROBAN) 2 % APPLY 1 APPLICATION TOPICALLY 2 TIMES DAILY AS NEEDED. 15 g 3   nadolol (CORGARD) 20 MG tablet Take 1 tablet (20 mg total) by mouth daily. 90 tablet 3   nystatin (MYCOSTATIN) 100000 UNIT/ML suspension TAKE 5 MLS (500,000 UNITS TOTAL) BY MOUTH 3 (THREE) TIMES DAILY. SWISH AND SWALLOW 120 mL 0   pantoprazole (PROTONIX) 40 MG tablet Take 1 tablet (40 mg total) by mouth daily. 90 tablet 3   rifaximin (XIFAXAN) 550 MG TABS tablet Take 1 tablet (550 mg total) by mouth 2 (two) times daily. 180 tablet 3   SYSTANE COMPLETE 0.6 % SOLN Apply 1 drop to eye 2 (two) times daily.     Tiotropium Bromide Monohydrate (SPIRIVA RESPIMAT) 2.5 MCG/ACT AERS Inhale 2 puffs into the  lungs daily. 12 g 0   ursodiol (ACTIGALL) 300 MG capsule TAKE 1 CAPSULE BY MOUTH EVERY DAY 90 capsule 3   furosemide (LASIX) 20 MG tablet TAKE 1 TABLET BY MOUTH EVERY DAY 90 tablet 1   No facility-administered medications prior to visit.     Per HPI unless specifically indicated in ROS section below Review of Systems  Objective:  BP 128/64   Pulse 80   Temp 98.4 F (36.9 C) (Oral)   Ht 5\' 4"  (1.626 m)   Wt 113 lb (51.3 kg)   LMP  (LMP Unknown)   SpO2 99%   BMI 19.40 kg/m   Wt Readings from Last 3 Encounters:  01/31/23 113 lb (51.3 kg)  01/13/23 113 lb 8 oz (51.5 kg)  12/29/22 107 lb 8 oz (48.8 kg)      Physical Exam Vitals and nursing note reviewed.  Constitutional:      Appearance: Normal  appearance. She is not ill-appearing.  Musculoskeletal:     Right lower leg: No edema.     Left lower leg: No edema.  Skin:    General: Skin is warm and dry.     Findings: Bruising, signs of injury and laceration present.          Comments:  Skin tear to R dorsal distal forearm as per picture, ~2.5x3cm Laceration to distal left ulnar index finger about 2cm length, able to flex/extend at DIP Bruising to left mid upper arm as well as R shoulder and R forehead  Neurological:     Mental Status: She is alert.  Psychiatric:        Mood and Affect: Mood normal.        Behavior: Behavior normal.    Wound care: Wounds cleaned with normal saline and gauze, dressed with triple abx ointment and wrist wound covered with tegaderm and finger wound covered with non-stick gauze followed by tube gauze. Home wound care instructions provided  L index finger:   R dorsal wrist:      Lab Results  Component Value Date   NA 133 (L) 12/16/2022   CL 99 12/16/2022   K 4.6 12/16/2022   CO2 29 12/16/2022   BUN 12 12/16/2022   CREATININE 0.81 12/16/2022   GFR 72.73 12/16/2022   CALCIUM 9.7 12/16/2022   PHOS 3.5 07/26/2019   ALBUMIN 4.0 12/16/2022   GLUCOSE 120 (H) 12/16/2022     Assessment & Plan:   Problem List Items Addressed This Visit     Skin tear of forearm without complication, right, initial encounter - Primary    Wound cleaned and dressed in usual fashion as per above, home care instructions provided.  Td updated.  Rx keflex 500mg  TID preventatively for finger wound.  Red flags to seek urgent care reviewed.       Relevant Orders   Td vaccine greater than or equal to 7yo preservative free IM (Completed)   Laceration of left index finger    See above. Wound cleaned and dressed, wound care instructions provided.       Relevant Orders   Td vaccine greater than or equal to 7yo preservative free IM (Completed)     Meds ordered this encounter  Medications   cephALEXin (KEFLEX)  500 MG capsule    Sig: Take 1 capsule (500 mg total) by mouth 3 (three) times daily.    Dispense:  15 capsule    Refill:  0    Orders Placed This Encounter  Procedures  Td vaccine greater than or equal to 7yo preservative free IM    Patient Instructions  Update Td (tetanus) today.  Start keflex antibiotic sent to pharmacy for next 5 days.  Dressing changes every 1-2 days as discussed.  Wash wounds with soapy water, ensure all soap is washed off, pat dry, then dress with neosporin or triple antibiotic  Return if not healing as expected or streaking redness from wound, or fever or new symptoms develop.   Follow up plan: Return if symptoms worsen or fail to improve.  Eustaquio Boyden, MD

## 2023-01-31 NOTE — Assessment & Plan Note (Signed)
See above. Wound cleaned and dressed, wound care instructions provided.

## 2023-01-31 NOTE — Assessment & Plan Note (Signed)
Wound cleaned and dressed in usual fashion as per above, home care instructions provided.  Td updated.  Rx keflex 500mg  TID preventatively for finger wound.  Red flags to seek urgent care reviewed.

## 2023-01-31 NOTE — Patient Instructions (Addendum)
Update Td (tetanus) today.  Start keflex antibiotic sent to pharmacy for next 5 days.  Dressing changes every 1-2 days as discussed.  Wash wounds with soapy water, ensure all soap is washed off, pat dry, then dress with neosporin or triple antibiotic  Return if not healing as expected or streaking redness from wound, or fever or new symptoms develop.

## 2023-01-31 NOTE — Patient Outreach (Signed)
Care Coordination   Initial Visit Note   01/31/2023 Name: CHANESE JELSMA MRN: [id number] DOB: 05-Jan-1951  MARTIZA RASK is a 72 y.o. year old female who sees Tower, Audrie Gallus, MD for primary care. I spoke with  [name] by phone today.  What matters to the patients health and wellness today?  Patient wants resources.  Pt owns her home and there are several repairs needed. Patient wants food assistance and Sw will email food banks in the community. SW explains Meals on Wheels is for home bound seniors and patients is not home bound.     Goals Addressed             This Visit's Progress    Care Coodination Activities       Interventions Today    Flowsheet Row Most Recent Value  Chronic Disease   Chronic disease during today's visit Diabetes, Other  [Cancer]  General Interventions   General Interventions Discussed/Reviewed General Interventions Discussed, General Interventions Reviewed, [employer] uses a microwave because stove doesn't work. No resources to replace stove and pt states she has no money to repair. Pt agreed to recert foodstamps, apply for [employer] asst, apply w/[facility] for Home Rehab program & Meals on Wheels.]              SDOH assessments and interventions completed:  Yes  SDOH Interventions Today    Flowsheet Row Most Recent Value  SDOH Interventions   Food Insecurity Interventions Intervention Not Indicated  Housing Interventions Intervention Not Indicated  Transportation Interventions Intervention Not Indicated, Other (Comment)  [Has access to a car]  Utilities Interventions Intervention Not Indicated        Care Coordination Interventions:  Yes, provided   Follow up plan: Follow up call scheduled for 03/03/23 at 1pm    Encounter Outcome:  Patient Visit Completed

## 2023-02-01 NOTE — Telephone Encounter (Signed)
See previous message for approval

## 2023-02-01 NOTE — Telephone Encounter (Signed)
PA approved through 04/12/2023 with [facility]

## 2023-02-10 NOTE — Telephone Encounter (Signed)
I am still with the team but am also in all the other clinics, your requests are done as fast as possible with our PA team.

## 2023-02-27 ENCOUNTER — Other Ambulatory Visit: Payer: Self-pay | Admitting: Family Medicine

## 2023-03-03 ENCOUNTER — Ambulatory Visit: Payer: Medicare HMO | Admitting: Primary Care

## 2023-03-03 ENCOUNTER — Encounter: Payer: Self-pay | Admitting: Primary Care

## 2023-03-03 ENCOUNTER — Ambulatory Visit: Payer: Self-pay

## 2023-03-03 VITALS — BP 128/76 | HR 73 | Temp 97.2°F | Ht 64.0 in | Wt 114.0 lb

## 2023-03-03 DIAGNOSIS — S51811A Laceration without foreign body of right forearm, initial encounter: Secondary | ICD-10-CM

## 2023-03-03 MED ORDER — CEPHALEXIN 500 MG PO CAPS
500.0000 mg | ORAL_CAPSULE | Freq: Three times a day (TID) | ORAL | 0 refills | Status: AC
Start: 2023-03-03 — End: 2023-03-10

## 2023-03-03 NOTE — Assessment & Plan Note (Addendum)
New injury. Appears infected.   Wound appears clean and dry.  Discussed other home care instructions.   Start cephalexin 500 mg TID x 7 days. Close follow up with PCP early next week.  Dressing applied today.  Td UTD.

## 2023-03-03 NOTE — Patient Outreach (Signed)
  Care Coordination   Follow Up Visit Note   03/03/2023 Name: Christine Barton MRN: [id number] DOB: 04/26/1950  Christine Barton is a 72 y.o. year old female who sees Tower, Audrie Gallus, MD for primary care. I spoke with  Christine Barton by phone today.  What matters to the patients health and wellness today?  Patient needs home repairs.     Goals Addressed             This Visit's Progress    Care Coodination Activities       Interventions Today    Flowsheet Row Most Recent Value  General Interventions   General Interventions Discussed/Reviewed General Interventions Discussed, General Interventions Reviewed, Community Resources  [Pt has rec'd the application for home repairs but has not returned with documentation required. Pt plans to apply for Medicaid to pay for Medicare fee. Pt has not needed assistane from food banks at this time.]              SDOH assessments and interventions completed:  Yes  SDOH Interventions Today    Flowsheet Row Most Recent Value  SDOH Interventions   Food Insecurity Interventions Intervention Not Indicated  Housing Interventions Intervention Not Indicated  Transportation Interventions Intervention Not Indicated  Utilities Interventions Intervention Not Indicated        Care Coordination Interventions:  Yes, provided   Follow up plan: No further intervention required.   Encounter Outcome:  Patient Visit Completed

## 2023-03-03 NOTE — Progress Notes (Addendum)
Subjective:    Patient ID: Christine Barton, female    DOB: 1950-08-24, 72 y.o.   MRN: [id number]  Arm Pain     Christine Barton is a very pleasant 72 y.o. female patient of Dr. Milinda Antis with a history of lung cancer, pulmonary emphysema, cirrhosis of the liver, hyperlipidemia, neuropathy of bilateral feet, osteopenia, breast cancer, imbalance, anemia, malnutrition who presents today to discuss upper extremity pain.  Symptom onset 2 evenings ago after a minor fall.  She was attempting to sit on her bed, didn't have her bottom high enough and accidentally slid down her bed in between the bed and nightstand on the right side. During that process she sustained several superficial and deep skin tears to the right upper extremity, posterior forearm (anatomical position).   Last night she began noticing erythema, warmth, and swelling to the right upper extremity at the site of injury. She's kept the wound clean and dry since the initial incident. Also applying bandages for protection.   Recently treated for skin tear to the right upper extremity on 01/31/23 with cephalexin TID x 5 days. She completed this course. Tetanus vaccine was updated at that time.   She denies bony tenderness, deformity.   Review of Systems  Constitutional:  Negative for fever.  Skin:  Positive for color change and wound.         Past Medical History:  Diagnosis Date   Alcohol abuse, unspecified    Breast cancer (HCC) 1998   Right   Cataract    left eye   Cervical spondylosis 2006   MRI   Degeneration of cervical intervertebral disc 2006   MRI   Diabetes mellitus without complication (HCC)    Diverticulosis of colon (without mention of hemorrhage)    Hyperpotassemia    Lung cancer (HCC) 03/2021   started radiation in January 2023, right side   Microscopic hematuria    Mononeuritis of unspecified site    Nonspecific abnormal results of liver function study    Other abnormal glucose    Other and unspecified  hyperlipidemia    no per pt   Other chronic nonalcoholic liver disease    Personal history of chemotherapy    Personal history of malignant neoplasm of breast    Personal history of radiation therapy    Pneumothorax, acute    right, spontaneous   Tobacco use disorder    Unspecified vitamin D deficiency     Social History   Socioeconomic History   Marital status: Single    Spouse name: Not on file   Number of children: 1   Years of education: Not on file   Highest education level: Not on file  Occupational History   Occupation: retired    Associate Professor: [employer]  Tobacco Use   Smoking status: Every Day    Current packs/day: 1.00    Average packs/day: 1 pack/day for 58.3 years (58.3 ttl pk-yrs)    Types: Cigarettes    Start date: 09/25/2021    Passive exposure: Past   Smokeless tobacco: Never   Tobacco comments:    Started smoking at 72 years old.     1PPD 12/20/2022 khj  Vaping Use   Vaping status: Never Used  Substance and Sexual Activity   Alcohol use: Not Currently   Drug use: No   Sexual activity: Not Currently  Other Topics Concern   Not on file  Social History Narrative   Divorced      1 child  Works at [employer] Determinants of [employer]: Low Risk  (11/07/2020)   Overall Financial Resource Strain (CARDIA)    Difficulty of Paying Living Expenses: Not hard at all  Food Insecurity: Food Insecurity Present (01/31/2023)   Hunger Vital Sign    Worried About Running Out of Food in the Last Year: Never true    Ran Out of Food in the Last Year: Sometimes true  Transportation Needs: No Transportation Needs (01/31/2023)   PRAPARE - Administrator, Civil Service (Medical): No    Lack of Transportation (Non-Medical): No  Physical Activity: Inactive (11/07/2020)   Exercise Vital Sign    Days of Exercise per Week: 0 days    Minutes of Exercise per Session: 0 min  Stress: No Stress Concern Present  (11/07/2020)   [employer] of Occupational Health - Occupational Stress Questionnaire    Feeling of Stress : Not at all  Social Connections: Not on file  Intimate Partner Violence: Not At Risk (11/07/2020)   Humiliation, Afraid, Rape, and Kick questionnaire    Fear of Current or Ex-Partner: No    Emotionally Abused: No    Physically Abused: No    Sexually Abused: No    Past Surgical History:  Procedure Laterality Date   BIOPSY  07/27/2019   Procedure: BIOPSY;  Surgeon: Benancio Deeds, MD;  Location: [facility];  Service: Gastroenterology;;   BREAST BIOPSY  9/03   Right   BREAST LUMPECTOMY Right 1998   CHEST TUBE INSERTION  11/02/2014   COLONOSCOPY     COLONOSCOPY WITH PROPOFOL N/A 07/27/2019   Procedure: COLONOSCOPY WITH PROPOFOL;  Surgeon: Benancio Deeds, MD;  Location: [facility];  Service: Gastroenterology;  Laterality: N/A;   COLONOSCOPY WITH PROPOFOL N/A 07/29/2022   Procedure: COLONOSCOPY WITH PROPOFOL;  Surgeon: Napoleon Form, MD;  Location: [facility];  Service: Gastroenterology;  Laterality: N/A;   ESOPHAGOGASTRODUODENOSCOPY (EGD) WITH PROPOFOL N/A 10/30/2018   Procedure: ESOPHAGOGASTRODUODENOSCOPY (EGD) WITH PROPOFOL;  Surgeon: Napoleon Form, MD;  Location: [facility];  Service: Endoscopy;  Laterality: N/A;   ESOPHAGOGASTRODUODENOSCOPY (EGD) WITH PROPOFOL N/A 03/12/2019   Procedure: ESOPHAGOGASTRODUODENOSCOPY (EGD) WITH PROPOFOL;  Surgeon: Napoleon Form, MD;  Location: [facility];  Service: Endoscopy;  Laterality: N/A;   ESOPHAGOGASTRODUODENOSCOPY (EGD) WITH PROPOFOL N/A 07/27/2019   Procedure: ESOPHAGOGASTRODUODENOSCOPY (EGD) WITH PROPOFOL;  Surgeon: Benancio Deeds, MD;  Location: [facility];  Service: Gastroenterology;  Laterality: N/A;   EYE SURGERY  02/2017   cataract extraction with lens implant-left   HEMOSTASIS CLIP PLACEMENT  07/29/2022   Procedure: HEMOSTASIS CLIP PLACEMENT;  Surgeon: Napoleon Form, MD;  Location:  [facility];  Service: Gastroenterology;;   HOT HEMOSTASIS N/A 07/27/2019   Procedure: HOT HEMOSTASIS (ARGON PLASMA COAGULATION/BICAP);  Surgeon: Benancio Deeds, MD;  Location: [facility];  Service: Gastroenterology;  Laterality: N/A;   MOUTH SURGERY     POLYPECTOMY  07/27/2019   Procedure: POLYPECTOMY;  Surgeon: Benancio Deeds, MD;  Location: [facility];  Service: Gastroenterology;;   POLYPECTOMY  07/29/2022   Procedure: POLYPECTOMY;  Surgeon: Napoleon Form, MD;  Location: [facility];  Service: Gastroenterology;;   TUBAL LIGATION      Family History  Problem Relation Age of Onset   Heart failure Father    Heart attack Father    Colon cancer Maternal Uncle    Stroke Mother    Esophageal cancer Neg Hx  Rectal cancer Neg Hx    Stomach cancer Neg Hx    Pancreatic cancer Neg Hx     Allergies  Allergen Reactions   Anoro Ellipta [Umeclidinium-Vilanterol] Cough   Bevespi Aerosphere [Glycopyrrolate-Formoterol] Cough    Prolonged coughing episode with first dose   Oxycodone Nausea And Vomiting   Crestor [Rosuvastatin]     Abdominal pain     Glipizide Other (See Comments)    Stomach pain    Current Outpatient Medications on File Prior to Visit  Medication Sig Dispense Refill   acetaminophen (TYLENOL) 500 MG tablet Take 500 mg by mouth every 6 (six) hours as needed for moderate pain.     albuterol (VENTOLIN HFA) 108 (90 Base) MCG/ACT inhaler INHALE 2 PUFFS INTO THE LUNGS EVERY 4 HOURS AS NEEDED FOR WHEEZE OR FOR SHORTNESS OF BREATH 8.5 each 2   cholecalciferol (VITAMIN D3) 25 MCG (1000 UNIT) tablet Take 1,000 Units by mouth daily.     clorazepate (TRANXENE) 3.75 MG tablet Take 1 tablet (3.75 mg total) by mouth 2 (two) times daily as needed for anxiety. 60 tablet 2   cyclobenzaprine (FLEXERIL) 10 MG tablet TAKE 1/2 TABLET BY MOUTH AT BEDTIME AS NEEDED FOR MUSCLE SPASMS 15 tablet 3   desvenlafaxine (PRISTIQ) 25 MG 24 hr tablet TAKE 1 TABLET (25 MG TOTAL) BY MOUTH  DAILY. 90 tablet 0   lactulose (CHRONULAC) 10 GM/15ML solution Take 15 mLs (10 g total) by mouth See admin instructions. Take 10 g daily, may take a second 10 g dose as needed for constipation 1000 mL 2   Multiple Vitamin (MULTIVITAMIN) tablet Take 1 tablet by mouth daily.     Multiple Vitamins-Minerals (PRESERVISION AREDS 2) CAPS Take 1 capsule by mouth 2 (two) times daily.     mupirocin ointment (BACTROBAN) 2 % APPLY 1 APPLICATION TOPICALLY 2 TIMES DAILY AS NEEDED. 15 g 3   nadolol (CORGARD) 20 MG tablet Take 1 tablet (20 mg total) by mouth daily. 90 tablet 3   nystatin (MYCOSTATIN) 100000 UNIT/ML suspension TAKE 5 MLS (500,000 UNITS TOTAL) BY MOUTH 3 (THREE) TIMES DAILY. SWISH AND SWALLOW 120 mL 0   pantoprazole (PROTONIX) 40 MG tablet Take 1 tablet (40 mg total) by mouth daily. 90 tablet 3   rifaximin (XIFAXAN) 550 MG TABS tablet Take 1 tablet (550 mg total) by mouth 2 (two) times daily. 180 tablet 3   SYSTANE COMPLETE 0.6 % SOLN Apply 1 drop to eye 2 (two) times daily.     Tiotropium Bromide Monohydrate (SPIRIVA RESPIMAT) 2.5 MCG/ACT AERS Inhale 2 puffs into the lungs daily. 12 g 0   ursodiol (ACTIGALL) 300 MG capsule TAKE 1 CAPSULE BY MOUTH EVERY DAY 90 capsule 3   No current facility-administered medications on file prior to visit.    BP 128/76   Pulse 73   Temp (!) 97.2 F (36.2 C) (Temporal)   Ht 5\' 4"  (1.626 m)   Wt 114 lb (51.7 kg)   LMP  (LMP Unknown)   SpO2 98%   BMI 19.57 kg/m  Objective:   Physical Exam Constitutional:      General: She is not in acute distress. Cardiovascular:     Rate and Rhythm: Normal rate.  Pulmonary:     Effort: Pulmonary effort is normal.  Skin:    General: Skin is warm and dry.     Findings: Erythema present.     Comments: Several smaller and larger superficial and deeper skin tears to the right posterior forearm (anatomical position)  without bleeding or drainage. Moderate surrounding erythema and swelling. No bony tenderness.             Assessment & Plan:  Skin tear of forearm without complication, right, initial encounter Assessment & Plan: New injury. Appears infected.   Wound appears clean and dry.  Discussed other home care instructions.   Start cephalexin 500 mg TID x 7 days. Close follow up with PCP early next week.  Dressing applied today.  Td UTD.  Orders: -     Cephalexin; Take 1 capsule (500 mg total) by mouth 3 (three) times daily for 7 days.  Dispense: 21 capsule; Refill: 0        Doreene Nest, NP

## 2023-03-03 NOTE — Patient Instructions (Signed)
Visit Information  Thank you for taking time to visit with me today. Please don't hesitate to contact me if I can be of assistance to you.   Following are the goals we discussed today:  Patient will submit application for home repairs within the next 30 days. Patient will apply for Medicaid within the next 30 days.   If you are experiencing a Mental Health or Behavioral Health Crisis or need someone to talk to, please call 911  Patient verbalizes understanding of instructions and care plan provided today and agrees to view in MyChart. Active MyChart status and patient understanding of how to access instructions and care plan via MyChart confirmed with patient.     No further follow up required: Patient does not request a follow up visit.  Lysle Morales, BSW Social Worker [phone]

## 2023-03-03 NOTE — Patient Instructions (Signed)
Start cephalexin 500 mg for the infection. Take 1 capsule by mouth three times daily for 7 days.  Keep the injury clean and dry.  Schedule a follow up visit with Dr. Milinda Antis for Tuesday next week.   It was a pleasure meeting you!

## 2023-03-07 NOTE — Progress Notes (Unsigned)
Subjective:    Patient ID: Christine Barton, female    DOB: 1951-03-03, 72 y.o.   MRN: [id number]  HPI  Wt Readings from Last 3 Encounters:  03/08/23 114 lb 2 oz (51.8 kg)  03/03/23 114 lb (51.7 kg)  01/31/23 113 lb (51.3 kg)   19.59 kg/m  Vitals:   03/08/23 1553  BP: 116/70  Pulse: 76  Temp: 98.6 F (37 C)  SpO2: 97%     Pt presents fof follow up of skin tear on forearm   Started with fall on 01/28/23 at home  Seen on 10/21 Td updated Keflex prescription  That got better   She was then seen again on 11/21 for a new fall /injury from 2 evenings prior  Was attempting to sit on bed andslid off  Sustained tears to RUE   Per NP Clark Skin tear of forearm without complication, right, initial encounter Assessment & Plan: New injury. Appears infected.    Wound appears clean and dry.  Discussed other home care instructions.    Start cephalexin 500 mg TID x 7 days. Close follow up with PCP early next week.   Dressing applied today.   Td UTD.   Orders: -     Cephalexin; Take 1 capsule (500 mg total) by mouth 3 (three) times daily for 7 days.  Dispense: 21 capsule; Refill: 0   Feels better overall  Sore and occational itching  Using antibacterial soap/water/warm  Rinses it  Wraps with coban and non stick   Wants to check abdomen  GI took her off her diuretic and thinks she is doing ok  Weight is stable  Some days abdomen looks bigger than others     Patient Active Problem List   Diagnosis Date Noted   Skin tear of forearm without complication, right, initial encounter 01/31/2023   Laceration of left index finger 01/31/2023   Food insecurity 01/25/2023   Current use of proton pump inhibitor 12/14/2022   Osteopenia 06/17/2022   Pulmonary emphysema (HCC) 09/03/2021   Agatston CAC score, >400 07/02/2021   Left-sided chest wall pain 06/04/2021   Lung cancer (HCC) 02/25/2021   Iron deficiency anemia 01/01/2021   Iron deficiency anemia due to chronic  blood loss 11/06/2020   AVM (arteriovenous malformation) of colon    Benign neoplasm of colon    NASH (nonalcoholic steatohepatitis)    Malnutrition (HCC) 07/25/2019   HSV infection 02/23/2019   Aortic atherosclerosis (HCC) 01/22/2019   Coronary atherosclerosis 01/22/2019   Encounter for screening for lung cancer 01/04/2019   Venous insufficiency 11/15/2018   Esophageal varices without bleeding (HCC) 10/27/2018   Anemia 10/23/2018   Marasmus (HCC) 07/26/2018   Poor balance 05/30/2018   Cirrhosis of liver (HCC) 04/24/2018   Gallstones 04/24/2018   Heme positive stool 04/24/2018   Smoker 11/07/2017   Welcome to Medicare preventive visit 10/26/2016   Estrogen deficiency 10/26/2016   Colon cancer screening 11/01/2014   Encounter for routine gynecological examination 09/12/2013   Left shoulder pain 08/20/2011   Routine general medical examination at a health care facility 07/21/2011   Vitamin D deficiency 08/04/2009   POSTMENOPAUSAL STATUS 08/04/2009   Prediabetes 02/18/2009   Hyperlipidemia associated with type 2 diabetes mellitus (HCC) 06/14/2008   DISC DISEASE, CERVICAL 03/30/2007   History of alcohol abuse 12/06/2006   Neuropathy of both feet 12/06/2006   DIVERTICULOSIS, COLON 12/06/2006   Fatty liver 12/06/2006   BREAST CANCER, HX OF 12/06/2006   Past Medical History:  Diagnosis  Date   Alcohol abuse, unspecified    Breast cancer (HCC) 1998   Right   Cataract    left eye   Cervical spondylosis 2006   MRI   Degeneration of cervical intervertebral disc 2006   MRI   Diabetes mellitus without complication (HCC)    Diverticulosis of colon (without mention of hemorrhage)    Hyperpotassemia    Lung cancer (HCC) 03/2021   started radiation in January 2023, right side   Microscopic hematuria    Mononeuritis of unspecified site    Nonspecific abnormal results of liver function study    Other abnormal glucose    Other and unspecified hyperlipidemia    no per pt   Other  chronic nonalcoholic liver disease    Personal history of chemotherapy    Personal history of malignant neoplasm of breast    Personal history of radiation therapy    Pneumothorax, acute    right, spontaneous   Tobacco use disorder    Unspecified vitamin D deficiency    Past Surgical History:  Procedure Laterality Date   BIOPSY  07/27/2019   Procedure: BIOPSY;  Surgeon: Benancio Deeds, MD;  Location: [facility];  Service: Gastroenterology;;   BREAST BIOPSY  9/03   Right   BREAST LUMPECTOMY Right 1998   CHEST TUBE INSERTION  11/02/2014   COLONOSCOPY     COLONOSCOPY WITH PROPOFOL N/A 07/27/2019   Procedure: COLONOSCOPY WITH PROPOFOL;  Surgeon: Benancio Deeds, MD;  Location: [facility];  Service: Gastroenterology;  Laterality: N/A;   COLONOSCOPY WITH PROPOFOL N/A 07/29/2022   Procedure: COLONOSCOPY WITH PROPOFOL;  Surgeon: Napoleon Form, MD;  Location: [facility];  Service: Gastroenterology;  Laterality: N/A;   ESOPHAGOGASTRODUODENOSCOPY (EGD) WITH PROPOFOL N/A 10/30/2018   Procedure: ESOPHAGOGASTRODUODENOSCOPY (EGD) WITH PROPOFOL;  Surgeon: Napoleon Form, MD;  Location: [facility];  Service: Endoscopy;  Laterality: N/A;   ESOPHAGOGASTRODUODENOSCOPY (EGD) WITH PROPOFOL N/A 03/12/2019   Procedure: ESOPHAGOGASTRODUODENOSCOPY (EGD) WITH PROPOFOL;  Surgeon: Napoleon Form, MD;  Location: [facility];  Service: Endoscopy;  Laterality: N/A;   ESOPHAGOGASTRODUODENOSCOPY (EGD) WITH PROPOFOL N/A 07/27/2019   Procedure: ESOPHAGOGASTRODUODENOSCOPY (EGD) WITH PROPOFOL;  Surgeon: Benancio Deeds, MD;  Location: [facility];  Service: Gastroenterology;  Laterality: N/A;   EYE SURGERY  02/2017   cataract extraction with lens implant-left   HEMOSTASIS CLIP PLACEMENT  07/29/2022   Procedure: HEMOSTASIS CLIP PLACEMENT;  Surgeon: Napoleon Form, MD;  Location: [facility];  Service: Gastroenterology;;   HOT HEMOSTASIS N/A 07/27/2019   Procedure: HOT HEMOSTASIS  (ARGON PLASMA COAGULATION/BICAP);  Surgeon: Benancio Deeds, MD;  Location: [facility];  Service: Gastroenterology;  Laterality: N/A;   MOUTH SURGERY     POLYPECTOMY  07/27/2019   Procedure: POLYPECTOMY;  Surgeon: Benancio Deeds, MD;  Location: [facility];  Service: Gastroenterology;;   POLYPECTOMY  07/29/2022   Procedure: POLYPECTOMY;  Surgeon: Napoleon Form, MD;  Location: [facility];  Service: Gastroenterology;;   TUBAL LIGATION     Social History   Tobacco Use   Smoking status: Every Day    Current packs/day: 1.00    Average packs/day: 1 pack/day for 58.3 years (58.3 ttl pk-yrs)    Types: Cigarettes    Start date: 09/25/2021    Passive exposure: Past   Smokeless tobacco: Never   Tobacco comments:    Started smoking at 72 years old.     1PPD 12/20/2022 khj  Vaping Use   Vaping status: Never Used  Substance Use Topics  Alcohol use: Not Currently   Drug use: No   Family History  Problem Relation Age of Onset   Heart failure Father    Heart attack Father    Colon cancer Maternal Uncle    Stroke Mother    Esophageal cancer Neg Hx    Rectal cancer Neg Hx    Stomach cancer Neg Hx    Pancreatic cancer Neg Hx    Allergies  Allergen Reactions   Anoro Ellipta [Umeclidinium-Vilanterol] Cough   Bevespi Aerosphere [Glycopyrrolate-Formoterol] Cough    Prolonged coughing episode with first dose   Oxycodone Nausea And Vomiting   Crestor [Rosuvastatin]     Abdominal pain     Glipizide Other (See Comments)    Stomach pain   Current Outpatient Medications on File Prior to Visit  Medication Sig Dispense Refill   acetaminophen (TYLENOL) 500 MG tablet Take 500 mg by mouth every 6 (six) hours as needed for moderate pain.     albuterol (VENTOLIN HFA) 108 (90 Base) MCG/ACT inhaler INHALE 2 PUFFS INTO THE LUNGS EVERY 4 HOURS AS NEEDED FOR WHEEZE OR FOR SHORTNESS OF BREATH 8.5 each 2   cephALEXin (KEFLEX) 500 MG capsule Take 1 capsule (500 mg total) by mouth 3 (three)  times daily for 7 days. 21 capsule 0   cholecalciferol (VITAMIN D3) 25 MCG (1000 UNIT) tablet Take 1,000 Units by mouth daily.     clorazepate (TRANXENE) 3.75 MG tablet Take 1 tablet (3.75 mg total) by mouth 2 (two) times daily as needed for anxiety. 60 tablet 2   cyclobenzaprine (FLEXERIL) 10 MG tablet TAKE 1/2 TABLET BY MOUTH AT BEDTIME AS NEEDED FOR MUSCLE SPASMS 15 tablet 3   desvenlafaxine (PRISTIQ) 25 MG 24 hr tablet TAKE 1 TABLET (25 MG TOTAL) BY MOUTH DAILY. 90 tablet 0   lactulose (CHRONULAC) 10 GM/15ML solution Take 15 mLs (10 g total) by mouth See admin instructions. Take 10 g daily, may take a second 10 g dose as needed for constipation 1000 mL 2   Multiple Vitamin (MULTIVITAMIN) tablet Take 1 tablet by mouth daily.     Multiple Vitamins-Minerals (PRESERVISION AREDS 2) CAPS Take 1 capsule by mouth 2 (two) times daily.     mupirocin ointment (BACTROBAN) 2 % APPLY 1 APPLICATION TOPICALLY 2 TIMES DAILY AS NEEDED. 15 g 3   nadolol (CORGARD) 20 MG tablet Take 1 tablet (20 mg total) by mouth daily. 90 tablet 3   nystatin (MYCOSTATIN) 100000 UNIT/ML suspension TAKE 5 MLS (500,000 UNITS TOTAL) BY MOUTH 3 (THREE) TIMES DAILY. SWISH AND SWALLOW 120 mL 0   pantoprazole (PROTONIX) 40 MG tablet Take 1 tablet (40 mg total) by mouth daily. 90 tablet 3   rifaximin (XIFAXAN) 550 MG TABS tablet Take 1 tablet (550 mg total) by mouth 2 (two) times daily. 180 tablet 3   SYSTANE COMPLETE 0.6 % SOLN Apply 1 drop to eye 2 (two) times daily.     Tiotropium Bromide Monohydrate (SPIRIVA RESPIMAT) 2.5 MCG/ACT AERS Inhale 2 puffs into the lungs daily. 12 g 0   ursodiol (ACTIGALL) 300 MG capsule TAKE 1 CAPSULE BY MOUTH EVERY DAY 90 capsule 3   No current facility-administered medications on file prior to visit.    Review of Systems  Constitutional:  Positive for fatigue. Negative for activity change, appetite change, fever and unexpected weight change.  HENT:  Negative for congestion, ear pain, rhinorrhea, sinus  pressure and sore throat.   Eyes:  Negative for pain, redness and visual disturbance.  Respiratory:  Positive for cough. Negative for shortness of breath and wheezing.        Baseline cough with copd   Cardiovascular:  Negative for chest pain and palpitations.  Gastrointestinal:  Negative for abdominal pain, blood in stool, constipation and diarrhea.  Endocrine: Negative for polydipsia and polyuria.  Genitourinary:  Negative for dysuria, frequency and urgency.  Musculoskeletal:  Negative for arthralgias, back pain and myalgias.  Skin:  Positive for wound. Negative for pallor and rash.  Allergic/Immunologic: Negative for environmental allergies.  Neurological:  Negative for dizziness, syncope and headaches.  Hematological:  Negative for adenopathy. Does not bruise/bleed easily.  Psychiatric/Behavioral:  Negative for decreased concentration and dysphoric mood. The patient is not nervous/anxious.        Objective:   Physical Exam Constitutional:      General: She is not in acute distress.    Appearance: Normal appearance. She is well-developed and normal weight. She is not ill-appearing or diaphoretic.     Comments: Slim Mildly frail appearing   HENT:     Head: Normocephalic and atraumatic.  Eyes:     Conjunctiva/sclera: Conjunctivae normal.     Pupils: Pupils are equal, round, and reactive to light.  Neck:     Thyroid: No thyromegaly.     Vascular: No carotid bruit or JVD.  Cardiovascular:     Rate and Rhythm: Normal rate and regular rhythm.     Heart sounds: Normal heart sounds.     No gallop.  Pulmonary:     Effort: Pulmonary effort is normal. No respiratory distress.     Breath sounds: Normal breath sounds. No wheezing or rales.     Comments: Diffusely distant bs  Frequent wet sounding cough/ baseline Abdominal:     General: There is no distension or abdominal bruit.     Palpations: Abdomen is soft. There is no mass.     Tenderness: There is no abdominal tenderness.  There is no guarding or rebound.  Musculoskeletal:     Cervical back: Normal range of motion and neck supple.     Right lower leg: No edema.     Left lower leg: No edema.  Lymphadenopathy:     Cervical: No cervical adenopathy.  Skin:    General: Skin is warm and dry.     Coloration: Skin is not pale.     Findings: No rash.     Comments: Several skin tears of right forearm- both anterior and posterior  Linear and rectangular shaped 2 wet areas with yellow granulation tissue Erythema less than 1-2 mm surrounding each No swelling Minimal tenderness   Neurological:     Mental Status: She is alert.     Coordination: Coordination normal.     Deep Tendon Reflexes: Reflexes are normal and symmetric. Reflexes normal.  Psychiatric:        Mood and Affect: Mood normal.           Assessment & Plan:   Problem List Items Addressed This Visit       Digestive   Cirrhosis of liver (HCC)    Per pt - diuretic was stopped   Weight is sstable No edema No change in abd exam This is reassuring  She will continue GI f/u        Musculoskeletal and Integument   Skin tear of forearm without complication, right, initial encounter - Primary    This looks to be healing  No acute signs of infection after course of keflex  Expect deeper  areas to heal by 2ndary intention- discussed this expectation  Re dressed today with antibiotic ointment and non stick telfa pads and coban Discussed dressing instructions after cleaning with soap and water  Update if not starting to improve in a week or if worsening    Call back and Er precautions noted in detail today          Other   Smoker    Continues to smoke In setting of copd and lung cancer in past  Disc in detail risks of smoking and possible outcomes including copd, vascular/ heart disease, cancer , respiratory and sinus infections  Pt voices understanding Does not feel able to attempt quitting today   No change in lung exam Continued  smoker's cough noted

## 2023-03-08 ENCOUNTER — Ambulatory Visit: Payer: Medicare HMO | Admitting: Family Medicine

## 2023-03-08 ENCOUNTER — Encounter: Payer: Self-pay | Admitting: Family Medicine

## 2023-03-08 VITALS — BP 116/70 | HR 76 | Temp 98.6°F | Ht 64.0 in | Wt 114.1 lb

## 2023-03-08 DIAGNOSIS — K7031 Alcoholic cirrhosis of liver with ascites: Secondary | ICD-10-CM

## 2023-03-08 DIAGNOSIS — F17200 Nicotine dependence, unspecified, uncomplicated: Secondary | ICD-10-CM | POA: Diagnosis not present

## 2023-03-08 DIAGNOSIS — S51811A Laceration without foreign body of right forearm, initial encounter: Secondary | ICD-10-CM

## 2023-03-08 NOTE — Assessment & Plan Note (Signed)
This looks to be healing  No acute signs of infection after course of keflex  Expect deeper areas to heal by 2ndary intention- discussed this expectation  Re dressed today with antibiotic ointment and non stick telfa pads and coban Discussed dressing instructions after cleaning with soap and water  Update if not starting to improve in a week or if worsening    Call back and Er precautions noted in detail today

## 2023-03-08 NOTE — Assessment & Plan Note (Signed)
Per pt - diuretic was stopped   Weight is sstable No edema No change in abd exam This is reassuring  She will continue GI f/u

## 2023-03-08 NOTE — Assessment & Plan Note (Signed)
Continues to smoke In setting of copd and lung cancer in past  Disc in detail risks of smoking and possible outcomes including copd, vascular/ heart disease, cancer , respiratory and sinus infections  Pt voices understanding Does not feel able to attempt quitting today   No change in lung exam Continued smoker's cough noted

## 2023-03-08 NOTE — Patient Instructions (Addendum)
Don't use peroxide on wounds -it delays healing Also don't need alcohol   Soap and water is fine  Then non stick bandage with antibiotic ointment   Watch for increase in redness/swelling/pain or discharge Keep [place] posted   Once whole wound looks dry you can keep open to air (but protect from dirt)

## 2023-03-14 ENCOUNTER — Other Ambulatory Visit: Payer: Self-pay | Admitting: Medical Oncology

## 2023-03-14 DIAGNOSIS — C3490 Malignant neoplasm of unspecified part of unspecified bronchus or lung: Secondary | ICD-10-CM

## 2023-03-14 NOTE — Progress Notes (Unsigned)
Gave CS phone number to schedule her CT scan at [facility].

## 2023-03-22 ENCOUNTER — Encounter: Payer: Self-pay | Admitting: Adult Health

## 2023-03-22 ENCOUNTER — Ambulatory Visit: Payer: Medicare HMO | Admitting: Adult Health

## 2023-03-22 DIAGNOSIS — G4701 Insomnia due to medical condition: Secondary | ICD-10-CM

## 2023-03-22 DIAGNOSIS — F411 Generalized anxiety disorder: Secondary | ICD-10-CM | POA: Diagnosis not present

## 2023-03-22 MED ORDER — CLORAZEPATE DIPOTASSIUM 3.75 MG PO TABS
3.7500 mg | ORAL_TABLET | Freq: Three times a day (TID) | ORAL | 2 refills | Status: AC | PRN
Start: 2023-03-22 — End: ?

## 2023-03-22 MED ORDER — DESVENLAFAXINE SUCCINATE ER 50 MG PO TB24
50.0000 mg | ORAL_TABLET | Freq: Every day | ORAL | 2 refills | Status: DC
Start: 2023-03-22 — End: 2023-04-15

## 2023-03-22 NOTE — Progress Notes (Signed)
Christine Barton [id number] Sep 10, 1950 72 y.o.  Subjective:   Patient ID:  Christine Barton is a 72 y.o. (DOB [birth year]) female.  Chief Complaint: No chief complaint on file.   HPI Christine Barton presents to the office today for follow-up of GAD and insomnia.   Describes mood today as "ok". Pleasant. Reports tearfulness. Mood symptoms - reports decreased depression, anxiety and irritability. Denies recent panic attacks. Reports decreased worry, rumination, and over thinking. Mood has improved. Stating "I feel a little better". Feels like current medications are helpful - would like to increase doses. Improved interest and motivation. Taking medications as prescribed. Energy levels improved. Active, does not have a regular exercise routine.  Enjoys some usual interests and activities. Lives alone with 6 cats. Lives next to daughter. Spending time with family. Planning a trip to [place]. Appetite decreased. Weight stable - 113 pounds. Sleeps well most nights. Averages 7 to 8 hours. Focus and concentration stable. Completing tasks. Managing aspects of household. Retired from [employer]. Denies SI or HI.  Denies AH or VH. Denies self harm. Denies substance use. Has seen a therapist previously.    GAD-7    Flowsheet Row Office Visit from 01/31/2023 in [facility] Visit from 12/29/2022 in [facility] at [facility] Visit from 12/06/2022 in [facility] at [facility]  Total GAD-7 Score 0 0 7      Mini-Mental    Flowsheet Row Clinical Support from 11/06/2018 in [facility] at [facility] Clinical Support from 10/31/2017 in [facility] at [facility]  Total Score (max 30 points ) 17 20      PHQ2-9    Flowsheet Row Office Visit from 01/31/2023 in [facility] [place] [facility] Visit from 12/29/2022 in [facility]  at [facility] Office Visit from 12/06/2022 in [facility] at [facility] Office Visit from 02/23/2022 in [facility] [facility] [place] Telephone from 12/31/2021 in [facility] Coordination  PHQ-2 Total Score 0 0 2 0 6  PHQ-9 Total Score 1 2 8 1 10       Flowsheet Row Admission (Discharged) from 07/29/2022 in [facility] ED from 07/27/2021 in [facility] Emergency Department at [facility]  C-SSRS RISK CATEGORY No Risk No Risk        Review of Systems:  Review of Systems  Musculoskeletal:  Negative for gait problem.  Neurological:  Negative for tremors.  Psychiatric/Behavioral:         Please refer to HPI    Medications: I have reviewed the patient's current medications.  Current Outpatient Medications  Medication Sig Dispense Refill   acetaminophen (TYLENOL) 500 MG tablet Take 500 mg by mouth every 6 (six) hours as needed for moderate pain.     albuterol (VENTOLIN HFA) 108 (90 Base) MCG/ACT inhaler INHALE 2 PUFFS INTO THE LUNGS EVERY 4 HOURS AS NEEDED FOR WHEEZE OR FOR SHORTNESS OF BREATH 8.5 each 2   cholecalciferol (VITAMIN D3) 25 MCG (1000 UNIT) tablet Take 1,000 Units by mouth daily.     clorazepate (TRANXENE) 3.75 MG tablet Take 1 tablet (3.75 mg total) by mouth 2 (two) times daily as needed for anxiety. 60 tablet 2   cyclobenzaprine (FLEXERIL) 10 MG tablet TAKE 1/2 TABLET BY MOUTH AT BEDTIME AS NEEDED FOR MUSCLE SPASMS 15 tablet 3   desvenlafaxine (PRISTIQ) 25  MG 24 hr tablet TAKE 1 TABLET (25 MG TOTAL) BY MOUTH DAILY. 90 tablet 0   lactulose (CHRONULAC) 10 GM/15ML solution Take 15 mLs (10 g total) by mouth See admin instructions. Take 10 g daily, may take a second 10 g dose as needed for constipation 1000 mL 2   Multiple Vitamin (MULTIVITAMIN) tablet Take 1 tablet by mouth daily.     Multiple Vitamins-Minerals (PRESERVISION AREDS 2) CAPS Take 1 capsule by mouth 2 (two) times daily.      mupirocin ointment (BACTROBAN) 2 % APPLY 1 APPLICATION TOPICALLY 2 TIMES DAILY AS NEEDED. 15 g 3   nadolol (CORGARD) 20 MG tablet Take 1 tablet (20 mg total) by mouth daily. 90 tablet 3   nystatin (MYCOSTATIN) 100000 UNIT/ML suspension TAKE 5 MLS (500,000 UNITS TOTAL) BY MOUTH 3 (THREE) TIMES DAILY. SWISH AND SWALLOW 120 mL 0   pantoprazole (PROTONIX) 40 MG tablet Take 1 tablet (40 mg total) by mouth daily. 90 tablet 3   rifaximin (XIFAXAN) 550 MG TABS tablet Take 1 tablet (550 mg total) by mouth 2 (two) times daily. 180 tablet 3   SYSTANE COMPLETE 0.6 % SOLN Apply 1 drop to eye 2 (two) times daily.     Tiotropium Bromide Monohydrate (SPIRIVA RESPIMAT) 2.5 MCG/ACT AERS Inhale 2 puffs into the lungs daily. 12 g 0   ursodiol (ACTIGALL) 300 MG capsule TAKE 1 CAPSULE BY MOUTH EVERY DAY 90 capsule 3   No current facility-administered medications for this visit.    Medication Side Effects: None  Allergies:  Allergies  Allergen Reactions   Anoro Ellipta [Umeclidinium-Vilanterol] Cough   Bevespi Aerosphere [Glycopyrrolate-Formoterol] Cough    Prolonged coughing episode with first dose   Oxycodone Nausea And Vomiting   Crestor [Rosuvastatin]     Abdominal pain     Glipizide Other (See Comments)    Stomach pain    Past Medical History:  Diagnosis Date   Alcohol abuse, unspecified    Breast cancer (HCC) 1998   Right   Cataract    left eye   Cervical spondylosis 2006   MRI   Degeneration of cervical intervertebral disc 2006   MRI   Diabetes mellitus without complication (HCC)    Diverticulosis of colon (without mention of hemorrhage)    Hyperpotassemia    Lung cancer (HCC) 03/2021   started radiation in January 2023, right side   Microscopic hematuria    Mononeuritis of unspecified site    Nonspecific abnormal results of liver function study    Other abnormal glucose    Other and unspecified hyperlipidemia    no per pt   Other chronic nonalcoholic liver disease    Personal  history of chemotherapy    Personal history of malignant neoplasm of breast    Personal history of radiation therapy    Pneumothorax, acute    right, spontaneous   Tobacco use disorder    Unspecified vitamin D deficiency     Past Medical History, Surgical history, Social history, and Family history were reviewed and updated as appropriate.   Please see review of systems for further details on the patient's review from today.   Objective:   Physical Exam:  LMP  (LMP Unknown)   Physical Exam Constitutional:      General: She is not in acute distress. Musculoskeletal:        General: No deformity.  Neurological:     Mental Status: She is alert and oriented to person, place, and time.  Coordination: Coordination normal.  Psychiatric:        Attention and Perception: Attention and perception normal. She does not perceive auditory or visual hallucinations.        Mood and Affect: Affect is not labile, blunt, angry or inappropriate.        Speech: Speech normal.        Behavior: Behavior normal.        Thought Content: Thought content normal. Thought content is not paranoid or delusional. Thought content does not include homicidal or suicidal ideation. Thought content does not include homicidal or suicidal plan.        Cognition and Memory: Cognition and memory normal.        Judgment: Judgment normal.     Comments: Insight intact     Lab Review:     Component Value Date/Time   NA 133 (L) 12/16/2022 0939   NA 132 (L) 05/26/2021 1147   K 4.6 12/16/2022 0939   CL 99 12/16/2022 0939   CO2 29 12/16/2022 0939   GLUCOSE 120 (H) 12/16/2022 0939   BUN 12 12/16/2022 0939   BUN 21 05/26/2021 1147   CREATININE 0.81 12/16/2022 0939   CREATININE 0.78 12/15/2022 1055   CALCIUM 9.7 12/16/2022 0939   PROT 6.8 12/16/2022 0939   PROT 7.1 05/26/2021 1147   ALBUMIN 4.0 12/16/2022 0939   ALBUMIN 4.4 05/26/2021 1147   AST 17 12/16/2022 0939   AST 19 12/15/2022 1055   ALT 7 12/16/2022  0939   ALT 7 12/15/2022 1055   ALKPHOS 74 12/16/2022 0939   BILITOT 0.5 12/16/2022 0939   BILITOT 0.6 12/15/2022 1055   GFRNONAA >60 12/15/2022 1055   GFRAA 83 12/11/2019 1617       Component Value Date/Time   WBC 6.4 12/16/2022 0939   RBC 4.00 12/16/2022 0939   HGB 12.5 12/16/2022 0939   HGB 12.4 12/15/2022 1055   HGB 12.4 12/11/2019 1617   HCT 37.9 12/16/2022 0939   HCT 39.3 12/11/2019 1617   PLT 247.0 12/16/2022 0939   PLT 209 12/15/2022 1055   PLT 120 (L) 12/11/2019 1617   MCV 94.8 12/16/2022 0939   MCV 86 12/11/2019 1617   MCH 31.5 12/15/2022 1055   MCHC 33.1 12/16/2022 0939   RDW 16.4 (H) 12/16/2022 0939   RDW 17.0 (H) 12/11/2019 1617   LYMPHSABS 1.6 12/16/2022 0939   MONOABS 0.5 12/16/2022 0939   EOSABS 0.1 12/16/2022 0939   BASOSABS 0.0 12/16/2022 0939    No results found for: "POCLITH", "LITHIUM"   No results found for: "PHENYTOIN", "PHENOBARB", "VALPROATE", "CBMZ"   .res Assessment: Plan:    Plan:  PDMP reviewed  1. Tranxene 3.75 BID to TID prn anxiety 2. Increase Pristiq 25mg  to 50mg  daily  Will review notes and make recommendations for antidepressant medication.   RTC 3 months  Patient requesting notes be kept confidential - not locked.  Patient advised to contact office with any questions, adverse effects, or acute worsening in signs and symptoms.  Discussed potential benefits, risk, and side effects of benzodiazepines to include potential risk of tolerance and dependence, as well as possible drowsiness. Advised patient not to drive if experiencing drowsiness and to take lowest possible effective dose to minimize risk of dependence and tolerance.   There are no diagnoses linked to this encounter.   Please see After Visit Summary for patient specific instructions.  Future Appointments  Date Time Provider Department Center  03/22/2023  3:20 PM Brittin Janik, Thereasa Solo, NP  [facility]-CP None  04/01/2023  2:30 PM [facility] ANNUAL WELLNESS VISIT 1  [facility] PEC  04/19/2023  1:30 PM [facility]  04/27/2023  3:00 PM [facility] LAB [facility] None  04/27/2023  3:30 PM Si Gaul, MD [facility] None  05/11/2023 11:20 AM Nand[place]am, Eleonore Chiquito, MD [facility]    No orders of the defined types were placed in this encounter.   -------------------------------

## 2023-04-01 ENCOUNTER — Ambulatory Visit (INDEPENDENT_AMBULATORY_CARE_PROVIDER_SITE_OTHER): Payer: Medicare HMO

## 2023-04-01 VITALS — Ht 64.5 in | Wt 114.0 lb

## 2023-04-01 DIAGNOSIS — Z0000 Encounter for general adult medical examination without abnormal findings: Secondary | ICD-10-CM

## 2023-04-01 NOTE — Progress Notes (Signed)
Subjective:   Christine Barton is a 72 y.o. female who presents for Medicare Annual (Subsequent) preventive examination.  Visit Complete: Virtual I connected with  Christine Barton on 04/01/23 by a audio enabled telemedicine application and verified that I am speaking with the correct person using two identifiers.  Patient Location: Home  Provider Location: Office/Clinic  I discussed the limitations of evaluation and management by telemedicine. The patient expressed understanding and agreed to proceed.  Vital Signs: Because this visit was a virtual/telehealth visit, some criteria may be missing or patient reported. Any vitals not documented were not able to be obtained and vitals that have been documented are patient reported.  Patient Medicare AWV questionnaire was completed by the patient on (not done); I have confirmed that all information answered by patient is correct and no changes since this date.  Cardiac Risk Factors include: advanced age (>60men, >52 women);dyslipidemia;diabetes mellitus;sedentary lifestyle;smoking/ tobacco exposure    Objective:    Today's Vitals   04/01/23 1417  Weight: 114 lb (51.7 kg)  Height: 5' 4.5" (1.638 m)   Body mass index is 19.27 kg/m.     04/01/2023    2:39 PM 07/29/2022    9:32 AM 08/24/2021   10:07 AM 08/10/2021    9:36 AM 02/26/2021    9:53 AM 11/25/2020    1:00 PM 11/07/2020   10:37 AM  Advanced Directives  Does Patient Have a Medical Advance Directive? Yes Yes Yes Yes No No No  Type of Estate agent of Christine Barton;Living will Living will Healthcare Power of Christine Barton;Living will Healthcare Power of Christine Barton;Living will     Does patient want to make changes to medical advance directive?     No - Patient declined No - Patient declined No - Patient declined  Copy of Healthcare Power of Attorney in Chart? Yes - validated most recent copy scanned in chart (See row information) No - copy requested No - copy requested No -  copy requested     Would patient like information on creating a medical advance directive?     No - Patient declined    Pre-existing out of facility DNR order (yellow form or pink MOST form)  --         Current Medications (verified) Outpatient Encounter Medications as of 04/01/2023  Medication Sig   acetaminophen (TYLENOL) 500 MG tablet Take 500 mg by mouth every 6 (six) hours as needed for moderate pain.   albuterol (VENTOLIN HFA) 108 (90 Base) MCG/ACT inhaler INHALE 2 PUFFS INTO THE LUNGS EVERY 4 HOURS AS NEEDED FOR WHEEZE OR FOR SHORTNESS OF BREATH   cholecalciferol (VITAMIN D3) 25 MCG (1000 UNIT) tablet Take 1,000 Units by mouth daily.   clorazepate (TRANXENE) 3.75 MG tablet Take 1 tablet (3.75 mg total) by mouth 3 (three) times daily as needed for anxiety.   cyclobenzaprine (FLEXERIL) 10 MG tablet TAKE 1/2 TABLET BY MOUTH AT BEDTIME AS NEEDED FOR MUSCLE SPASMS   desvenlafaxine (PRISTIQ) 50 MG 24 hr tablet Take 1 tablet (50 mg total) by mouth daily.   lactulose (CHRONULAC) 10 GM/15ML solution Take 15 mLs (10 g total) by mouth See admin instructions. Take 10 g daily, may take a second 10 g dose as needed for constipation   Multiple Vitamin (MULTIVITAMIN) tablet Take 1 tablet by mouth daily.   Multiple Vitamins-Minerals (PRESERVISION AREDS 2) CAPS Take 1 capsule by mouth 2 (two) times daily.   mupirocin ointment (BACTROBAN) 2 % APPLY 1 APPLICATION TOPICALLY 2 TIMES  DAILY AS NEEDED.   nadolol (CORGARD) 20 MG tablet Take 1 tablet (20 mg total) by mouth daily.   nystatin (MYCOSTATIN) 100000 UNIT/ML suspension TAKE 5 MLS (500,000 UNITS TOTAL) BY MOUTH 3 (THREE) TIMES DAILY. SWISH AND SWALLOW   pantoprazole (PROTONIX) 40 MG tablet Take 1 tablet (40 mg total) by mouth daily.   rifaximin (XIFAXAN) 550 MG TABS tablet Take 1 tablet (550 mg total) by mouth 2 (two) times daily.   SYSTANE COMPLETE 0.6 % SOLN Apply 1 drop to eye 2 (two) times daily.   Tiotropium Bromide Monohydrate (SPIRIVA RESPIMAT)  2.5 MCG/ACT AERS Inhale 2 puffs into the lungs daily.   ursodiol (ACTIGALL) 300 MG capsule TAKE 1 CAPSULE BY MOUTH EVERY DAY   No facility-administered encounter medications on file as of 04/01/2023.    Allergies (verified) Anoro ellipta [umeclidinium-vilanterol], Bevespi aerosphere [glycopyrrolate-formoterol], Oxycodone, Crestor [rosuvastatin], and Glipizide   History: Past Medical History:  Diagnosis Date   Alcohol abuse, unspecified    Breast cancer (HCC) 1998   Right   Cataract    left eye   Cervical spondylosis 2006   MRI   Degeneration of cervical intervertebral disc 2006   MRI   Diabetes mellitus without complication (HCC)    Diverticulosis of colon (without mention of hemorrhage)    Hyperpotassemia    Lung cancer (HCC) 03/2021   started radiation in January 2023, right side   Microscopic hematuria    Mononeuritis of unspecified site    Nonspecific abnormal results of liver function study    Other abnormal glucose    Other and unspecified hyperlipidemia    no per pt   Other chronic nonalcoholic liver disease    Personal history of chemotherapy    Personal history of malignant neoplasm of breast    Personal history of radiation therapy    Pneumothorax, acute    right, spontaneous   Tobacco use disorder    Unspecified vitamin D deficiency    Past Surgical History:  Procedure Laterality Date   BIOPSY  07/27/2019   Procedure: BIOPSY;  Surgeon: Benancio Deeds, MD;  Location: [facility];  Service: Gastroenterology;;   BREAST BIOPSY  9/03   Right   BREAST LUMPECTOMY Right 1998   CHEST TUBE INSERTION  11/02/2014   COLONOSCOPY     COLONOSCOPY WITH PROPOFOL N/A 07/27/2019   Procedure: COLONOSCOPY WITH PROPOFOL;  Surgeon: Benancio Deeds, MD;  Location: [facility];  Service: Gastroenterology;  Laterality: N/A;   COLONOSCOPY WITH PROPOFOL N/A 07/29/2022   Procedure: COLONOSCOPY WITH PROPOFOL;  Surgeon: Napoleon Form, MD;  Location: [facility];   Service: Gastroenterology;  Laterality: N/A;   ESOPHAGOGASTRODUODENOSCOPY (EGD) WITH PROPOFOL N/A 10/30/2018   Procedure: ESOPHAGOGASTRODUODENOSCOPY (EGD) WITH PROPOFOL;  Surgeon: Napoleon Form, MD;  Location: [facility];  Service: Endoscopy;  Laterality: N/A;   ESOPHAGOGASTRODUODENOSCOPY (EGD) WITH PROPOFOL N/A 03/12/2019   Procedure: ESOPHAGOGASTRODUODENOSCOPY (EGD) WITH PROPOFOL;  Surgeon: Napoleon Form, MD;  Location: [facility];  Service: Endoscopy;  Laterality: N/A;   ESOPHAGOGASTRODUODENOSCOPY (EGD) WITH PROPOFOL N/A 07/27/2019   Procedure: ESOPHAGOGASTRODUODENOSCOPY (EGD) WITH PROPOFOL;  Surgeon: Benancio Deeds, MD;  Location: [facility];  Service: Gastroenterology;  Laterality: N/A;   EYE SURGERY  02/2017   cataract extraction with lens implant-left   HEMOSTASIS CLIP PLACEMENT  07/29/2022   Procedure: HEMOSTASIS CLIP PLACEMENT;  Surgeon: Napoleon Form, MD;  Location: [facility];  Service: Gastroenterology;;   HOT HEMOSTASIS N/A 07/27/2019   Procedure: HOT HEMOSTASIS (ARGON PLASMA COAGULATION/BICAP);  Surgeon:  Armbruster, Willaim Rayas, MD;  Location: [facility];  Service: Gastroenterology;  Laterality: N/A;   MOUTH SURGERY     POLYPECTOMY  07/27/2019   Procedure: POLYPECTOMY;  Surgeon: Benancio Deeds, MD;  Location: [facility];  Service: Gastroenterology;;   POLYPECTOMY  07/29/2022   Procedure: POLYPECTOMY;  Surgeon: Napoleon Form, MD;  Location: [facility];  Service: Gastroenterology;;   TUBAL LIGATION     Family History  Problem Relation Age of Onset   Heart failure Father    Heart attack Father    Colon cancer Maternal Uncle    Stroke Mother    Esophageal cancer Neg Hx    Rectal cancer Neg Hx    Stomach cancer Neg Hx    Pancreatic cancer Neg Hx    Social History   Socioeconomic History   Marital status: Single    Spouse name: Not on file   Number of children: 1   Years of education: Not on file   Highest education level: Not on  file  Occupational History   Occupation: retired    Associate Professor: [employer]  Tobacco Use   Smoking status: Every Day    Current packs/day: 1.00    Average packs/day: 1 pack/day for 58.4 years (58.4 ttl pk-yrs)    Types: Cigarettes    Start date: 09/25/2021    Passive exposure: Past   Smokeless tobacco: Never   Tobacco comments:    Started smoking at 72 years old.     1PPD 12/20/2022 khj  Vaping Use   Vaping status: Never Used  Substance and Sexual Activity   Alcohol use: Not Currently   Drug use: No   Sexual activity: Not Currently  Other Topics Concern   Not on file  Social History Narrative   Divorced      1 child      Works at [employer]         Social Drivers of [employer]: Low Risk  (04/01/2023)   Overall Financial Resource Strain (CARDIA)    Difficulty of Paying Living Expenses: Not hard at all  Food Insecurity: No Food Insecurity (04/01/2023)   Hunger Vital Sign    Worried About Running Out of Food in the Last Year: Never true    Ran Out of Food in the Last Year: Never true  Recent Concern: Food Insecurity - Food Insecurity Present (03/03/2023)   Hunger Vital Sign    Worried About Running Out of Food in the Last Year: Never true    Ran Out of Food in the Last Year: Sometimes true  Transportation Needs: No Transportation Needs (04/01/2023)   PRAPARE - Administrator, Civil Service (Medical): No    Lack of Transportation (Non-Medical): No  Physical Activity: Inactive (04/01/2023)   Exercise Vital Sign    Days of Exercise per Week: 0 days    Minutes of Exercise per Session: 0 min  Stress: No Stress Concern Present (04/01/2023)   [employer] of Occupational Health - Occupational Stress Questionnaire    Feeling of Stress : Not at all  Social Connections: Unknown (04/01/2023)   Social Connection and Isolation Panel [NHANES]    Frequency of Communication with Friends and Family: Never    Frequency of Social  Gatherings with Friends and Family: Never    Attends Religious Services: Never    Database administrator or Organizations: No    Attends Banker Meetings: Never    Marital Status: Not  on file    Tobacco Counseling Ready to quit: Not Answered Counseling given: Not Answered Tobacco comments: Started smoking at 72 years old.  1PPD 12/20/2022 khj   Clinical Intake:  Pre-visit preparation completed: No  Pain : No/denies pain   BMI - recorded: 19.27 Nutritional Status: BMI of 19-24  Normal Nutritional Risks: None Diabetes: Yes CBG done?: No Did pt. bring in CBG monitor from home?: No  How often do you need to have someone help you when you read instructions, pamphlets, or other written materials from your doctor or pharmacy?: 1 - Never  Interpreter Needed?: No  Comments: lives alone Information entered by :: B.Xzaviar Maloof,LPN   Activities of Daily Living    04/01/2023    2:39 PM  In your present state of health, do you have any difficulty performing the following activities:  Hearing? 0  Vision? 1  Difficulty concentrating or making decisions? 0  Walking or climbing stairs? 0  Dressing or bathing? 0  Doing errands, shopping? 0  Preparing Food and eating ? N  Using the Toilet? N  In the past six months, have you accidently leaked urine? Y  Do you have problems with loss of bowel control? N  Managing your Medications? N  Managing your Finances? N  Housekeeping or managing your Housekeeping? N    Patient Care Team: Tower, Audrie Gallus, MD as PCP - General Little Ishikawa, MD as PCP - Cardiology (Cardiology) Kathyrn Sheriff, Surgical [facility] (Inactive) as Pharmacist (Pharmacist) Carrie Mew, OD (Optometry) Si Gaul, MD as Consulting Physician (Oncology) Napoleon Form, MD as Consulting Physician (Gastroenterology)  Indicate any recent Medical Services you may have received from other than [facility] providers in the past year (date may be  approximate).     Assessment:   This is a routine wellness examination for [facility].  Hearing/Vision screen Hearing Screening - Comments:: Pt says her hearing fading alittle Vision Screening - Comments:: Pt has glasses:pt says she can see fine   Goals Addressed             This Visit's Progress    Exercise 150 min/wk Moderate Activity   Not on track    11/06/2018, would like to start exercising 3 days a week     Patient Stated   On track    11/06/2018, would like to regulate sleep     Patient Stated   On track    11/07/2019, I will continue to walk daily for about 10 minutes.      Patient Stated   On track    11/07/2020, I will maintain and continue medications as prescribed.        Depression Screen    04/01/2023    2:31 PM 01/31/2023   12:50 PM 12/29/2022   11:30 AM 12/06/2022   10:07 AM 02/23/2022   12:53 PM 12/31/2021    2:49 PM 09/28/2021   12:21 PM  PHQ 2/9 Scores  PHQ - 2 Score 0 0 0 2 0 6 0  PHQ- 9 Score  1 2 8 1 10      Fall Risk    04/01/2023    2:22 PM 01/31/2023    2:50 PM 01/31/2023   12:50 PM 12/29/2022   11:30 AM 12/06/2022   10:07 AM  Fall Risk   Falls in the past year? 1 1 1  0 0  Number falls in past yr: 0  0 0 0  Injury with Fall? 0   0 0  Risk for  fall due to : History of fall(s);Impaired balance/gait   No Fall Risks No Fall Risks  Follow up Education provided;Falls prevention discussed   Falls evaluation completed Falls evaluation completed    MEDICARE RISK AT HOME: Medicare Risk at Home Any stairs in or around the home?: Yes If so, are there any without handrails?: Yes Home free of loose throw rugs in walkways, pet beds, electrical cords, etc?: Yes Adequate lighting in your home to reduce risk of falls?: Yes Life alert?: No Use of a cane, walker or w/c?: Yes Grab bars in the bathroom?: Yes Shower chair or bench in shower?: No Elevated toilet seat or a handicapped toilet?: Yes  TIMED UP AND GO:  Was the test performed?  No     Cognitive Function:    11/07/2020   10:42 AM 11/07/2019    2:08 PM 11/06/2018    9:59 AM 10/31/2017    8:15 AM  MMSE - Mini Mental State Exam  Not completed: Refused     Orientation to time  5 4 5   Orientation to Place  5 5 5   Registration  3 3 3   Attention/ Calculation  5 2 0  Attention/Calculation-comments   missed r,o,w   Recall  3 2 3   Recall-comments   missed penny   Language- name 2 objects   0 0  Language- repeat  1 1 1   Language- follow 3 step command   0 3  Language- read & follow direction   0 0  Write a sentence   0 0  Copy design   0 0  Total score   17 20        04/01/2023    2:42 PM 11/24/2020   12:35 PM  6CIT Screen  What Year? 0 points 0 points  What month? 0 points 0 points  What time? 0 points 0 points  Count back from 20 0 points 0 points  Months in reverse 0 points 0 points  Repeat phrase 0 points 0 points  Total Score 0 points 0 points    Immunizations Immunization History  Administered Date(s) Administered   Fluad Quad(high Dose 65+) 12/04/2018, 01/19/2020, 12/23/2021   Fluad Trivalent(High Dose 65+) 12/06/2022   Hep A / Hep B 09/05/2018, 10/10/2018, 03/29/2019   Influenza Split 03/10/2011   Influenza Whole 02/11/2000, 02/06/2016   Influenza, High Dose Seasonal PF 02/03/2021   Influenza,inj,Quad PF,6+ Mos 02/16/2017, 04/18/2018   Influenza-Unspecified 02/11/2016   PFIZER(Purple Top)SARS-COV-2 Vaccination 05/21/2019, 06/16/2019, 02/13/2020   Pfizer Covid-19 Vaccine Bivalent Booster 39yrs & up 02/10/2021   Pneumococcal Conjugate-13 10/26/2016   Pneumococcal Polysaccharide-23 06/28/2008, 11/07/2017   Respiratory Syncytial Virus Vaccine,Recomb Aduvanted(Arexvy) 05/18/2022   Td 05/21/2002, 01/31/2023   Tdap 09/12/2013   Zoster Recombinant(Shingrix) 06/18/2022, 02/03/2023   Zoster, Live 08/24/2011    TDAP status: Up to date  Flu Vaccine status: Up to date  Pneumococcal vaccine status: Up to date  Covid-19 vaccine status: Completed  vaccines  Qualifies for Shingles Vaccine? Yes   Zostavax completed Yes   Shingrix Completed?: Yes  Screening Tests Health Maintenance  Topic Date Due   OPHTHALMOLOGY EXAM  12/23/2020   COVID-19 Vaccine (5 - 2024-25 season) 12/12/2022   FOOT EXAM  12/24/2022   MAMMOGRAM  12/28/2032 (Originally 11/18/2022)   HEMOGLOBIN A1C  06/15/2023   Diabetic kidney evaluation - eGFR measurement  12/16/2023   Diabetic kidney evaluation - Urine ACR  12/16/2023   Medicare Annual Wellness (AWV)  03/31/2024   Colonoscopy  07/28/2025  DTaP/Tdap/Td (4 - Td or Tdap) 01/30/2033   Pneumonia Vaccine 5+ Years old  Completed   INFLUENZA VACCINE  Completed   DEXA SCAN  Completed   Hepatitis C Screening  Completed   Zoster Vaccines- Shingrix  Completed   HPV VACCINES  Aged Out    Health Maintenance  Health Maintenance Due  Topic Date Due   OPHTHALMOLOGY EXAM  12/23/2020   COVID-19 Vaccine (5 - 2024-25 season) 12/12/2022   FOOT EXAM  12/24/2022    Colorectal cancer screening: Type of screening: Colonoscopy. Completed 07/29/22. Repeat every 3 years  Mammogram status: No longer required due to PER PT.  Bone Density status: Completed 06/17/22. Results reflect: Bone density results: OSTEOPENIA. Repeat every 3-5 years.  Lung Cancer Screening: (Low Dose CT Chest recommended if Age 65-80 years, 20 pack-year currently smoking OR have quit w/in 15years.) does not qualify.   Lung Cancer Screening Referral: no alredy done  Additional Screening:  Hepatitis C Screening: does not qualify; Completed 12/08/2018  Vision Screening: Recommended annual ophthalmology exams for early detection of glaucoma and other disorders of the eye. Is the patient up to date with their annual eye exam?  Yes  Who is the provider or what is the name of the office in which the patient attends annual eye exams? Dr Lew Dawes If pt is not established with a provider, would they like to be referred to a provider to establish care? No .    Dental Screening: Recommended annual dental exams for proper oral hygiene  Diabetic Foot Exam: Diabetic Foot Exam: Completed 12/29/22  Community Resource Referral / Chronic Care Management: CRR required this visit?  No   CCM required this visit?  No    Plan:     I have personally reviewed and noted the following in the patient's chart:   Medical and social history Use of alcohol, tobacco or illicit drugs  Current medications and supplements including opioid prescriptions. Patient is not currently taking opioid prescriptions. Functional ability and status Nutritional status Physical activity Advanced directives List of other physicians Hospitalizations, surgeries, and ER visits in previous 12 months Vitals Screenings to include cognitive, depression, and falls Referrals and appointments  In addition, I have reviewed and discussed with patient certain preventive protocols, quality metrics, and best practice recommendations. A written personalized care plan for preventive services as well as general preventive health recommendations were provided to patient.    Sue Lush, LPN   [id number]   After Visit Summary: (MyChart) Due to this being a telephonic visit, the after visit summary with patients personalized plan was offered to patient via MyChart   Nurse Notes: The patient states she is doing well and has no concerns or questions at this time.

## 2023-04-01 NOTE — Patient Instructions (Signed)
Christine Barton , Thank you for taking time to come for your Medicare Wellness Visit. I appreciate your ongoing commitment to your health goals. Please review the following plan we discussed and let me know if I can assist you in the future.   Referrals/Orders/Follow-Ups/Clinician Recommendations: none  This is a list of the screening recommended for you and due dates:  Health Maintenance  Topic Date Due   Eye exam for diabetics  12/23/2020   COVID-19 Vaccine (5 - 2024-25 season) 12/12/2022   Complete foot exam   12/24/2022   Mammogram  12/28/2032*   Hemoglobin A1C  06/15/2023   Yearly kidney function blood test for diabetes  12/16/2023   Yearly kidney health urinalysis for diabetes  12/16/2023   Medicare Annual Wellness Visit  03/31/2024   Colon Cancer Screening  07/28/2025   DTaP/Tdap/Td vaccine (4 - Td or Tdap) 01/30/2033   Pneumonia Vaccine  Completed   Flu Shot  Completed   DEXA scan (bone density measurement)  Completed   Hepatitis C Screening  Completed   Zoster (Shingles) Vaccine  Completed   HPV Vaccine  Aged Out  *Topic was postponed. The date shown is not the original due date.    Advanced directives: (In Chart) A copy of your advanced directives are scanned into your chart should your provider ever need it.  Next Medicare Annual Wellness Visit scheduled for next year: Yes 04/03/24 @ 2:20pm televisit

## 2023-04-14 ENCOUNTER — Telehealth: Payer: Self-pay | Admitting: Gastroenterology

## 2023-04-14 NOTE — Telephone Encounter (Signed)
 Patient called and stated that she needs to schedule an MRI appointment for her liver, and that her MRI should have been scheduled in September of last year but did not receive a call. Patient also mentioned that they want the MRI order to go to either [facility] or [facility] in [place]. Patient is requesting a call back.Please advise.

## 2023-04-14 NOTE — Telephone Encounter (Signed)
 Last MRI was 01/2022. Does she need a repeat MRI of the liver? She is scheduled for follow up with you 05/11/23. She would like to have it done before that appointment.

## 2023-04-15 ENCOUNTER — Other Ambulatory Visit: Payer: Self-pay | Admitting: Adult Health

## 2023-04-15 DIAGNOSIS — F411 Generalized anxiety disorder: Secondary | ICD-10-CM

## 2023-04-18 ENCOUNTER — Encounter: Payer: Self-pay | Admitting: Physician Assistant

## 2023-04-19 ENCOUNTER — Other Ambulatory Visit: Payer: Medicare HMO

## 2023-04-19 ENCOUNTER — Ambulatory Visit: Admission: RE | Admit: 2023-04-19 | Payer: Medicare HMO | Source: Ambulatory Visit

## 2023-04-19 ENCOUNTER — Ambulatory Visit
Admission: RE | Admit: 2023-04-19 | Discharge: 2023-04-19 | Disposition: A | Discharge status: Home / Self Care | Payer: Medicare HMO | Source: Ambulatory Visit | Attending: Physician Assistant | Admitting: Physician Assistant

## 2023-04-19 DIAGNOSIS — Z853 Personal history of malignant neoplasm of breast: Secondary | ICD-10-CM | POA: Diagnosis not present

## 2023-04-19 DIAGNOSIS — C3490 Malignant neoplasm of unspecified part of unspecified bronchus or lung: Secondary | ICD-10-CM | POA: Insufficient documentation

## 2023-04-19 DIAGNOSIS — R918 Other nonspecific abnormal finding of lung field: Secondary | ICD-10-CM | POA: Diagnosis not present

## 2023-04-19 LAB — POCT I-STAT, CHEM 8
BUN: 10 mg/dL (ref 8–23)
Calcium, Ion: 1.18 mmol/L (ref 1.15–1.40)
Chloride: 97 mmol/L — ABNORMAL LOW (ref 98–111)
Creatinine, Ser: 0.8 mg/dL (ref 0.44–1.00)
Glucose, Bld: 122 mg/dL — ABNORMAL HIGH (ref 70–99)
HCT: 37 % (ref 36.0–46.0)
Hemoglobin: 12.6 g/dL (ref 12.0–15.0)
Potassium: 4.3 mmol/L (ref 3.5–5.1)
Sodium: 133 mmol/L — ABNORMAL LOW (ref 135–145)
TCO2: 26 mmol/L (ref 22–32)

## 2023-04-19 MED ORDER — IOHEXOL 300 MG/ML  SOLN
75.0000 mL | Freq: Once | INTRAMUSCULAR | Status: AC | PRN
  Administered 2023-04-19: 75 mL via INTRAVENOUS

## 2023-04-20 ENCOUNTER — Encounter: Payer: Self-pay | Admitting: Physician Assistant

## 2023-04-20 NOTE — Telephone Encounter (Signed)
 She does not need MRI of liver.  She will need labs (CBC,CMP, PT/INR and AFP) and we can order them when she comes in for her follow-up visit.  Thank you

## 2023-04-21 ENCOUNTER — Encounter: Payer: Self-pay | Admitting: Gastroenterology

## 2023-04-21 NOTE — Telephone Encounter (Signed)
 Patient called requested to speak with you.

## 2023-04-21 NOTE — Telephone Encounter (Signed)
 Patient is advised.

## 2023-04-26 ENCOUNTER — Other Ambulatory Visit: Payer: Self-pay

## 2023-04-26 ENCOUNTER — Telehealth: Payer: Self-pay

## 2023-04-26 DIAGNOSIS — Z853 Personal history of malignant neoplasm of breast: Secondary | ICD-10-CM

## 2023-04-26 DIAGNOSIS — K7031 Alcoholic cirrhosis of liver with ascites: Secondary | ICD-10-CM

## 2023-04-26 DIAGNOSIS — C3490 Malignant neoplasm of unspecified part of unspecified bronchus or lung: Secondary | ICD-10-CM

## 2023-04-26 NOTE — Telephone Encounter (Signed)
 Order placed. Scheduling for the radiology notified.

## 2023-04-26 NOTE — Telephone Encounter (Signed)
 Please schedule MRI liver to exclude hepatocellular carcinoma, history of decompensated liver cirrhosis.  Thank you

## 2023-04-26 NOTE — Telephone Encounter (Signed)
 Spoke with the patient. She asks for reconsideration of ordering an imaging for her liver, specifically the MRI. She said she is worried and needs to know what is going on with her liver. She had thought she would always need this done on a yearly basis. She recently had imaging of her lungs and just know it's not going to be good either. I need to know what is happening with my liver. She wants to be able to see the pictures of her MRI when she comes for her appointment.

## 2023-04-27 ENCOUNTER — Inpatient Hospital Stay: Payer: Medicare HMO

## 2023-04-27 ENCOUNTER — Encounter: Payer: Self-pay | Admitting: Physician Assistant

## 2023-04-27 ENCOUNTER — Inpatient Hospital Stay: Payer: Medicare HMO | Attending: Internal Medicine | Admitting: Internal Medicine

## 2023-04-27 VITALS — BP 160/72 | HR 64 | Temp 97.5°F | Resp 16 | Ht 64.5 in | Wt 114.0 lb

## 2023-04-27 DIAGNOSIS — D500 Iron deficiency anemia secondary to blood loss (chronic): Secondary | ICD-10-CM | POA: Diagnosis not present

## 2023-04-27 DIAGNOSIS — K922 Gastrointestinal hemorrhage, unspecified: Secondary | ICD-10-CM | POA: Insufficient documentation

## 2023-04-27 DIAGNOSIS — C3490 Malignant neoplasm of unspecified part of unspecified bronchus or lung: Secondary | ICD-10-CM | POA: Diagnosis not present

## 2023-04-27 LAB — CBC WITH DIFFERENTIAL (CANCER CENTER ONLY)
Abs Immature Granulocytes: 0.04 10*3/uL (ref 0.00–0.07)
Basophils Absolute: 0 10*3/uL (ref 0.0–0.1)
Basophils Relative: 0 %
Eosinophils Absolute: 0.1 10*3/uL (ref 0.0–0.5)
Eosinophils Relative: 2 %
HCT: 38.2 % (ref 36.0–46.0)
Hemoglobin: 12.8 g/dL (ref 12.0–15.0)
Immature Granulocytes: 1 %
Lymphocytes Relative: 24 %
Lymphs Abs: 1.5 10*3/uL (ref 0.7–4.0)
MCH: 30.3 pg (ref 26.0–34.0)
MCHC: 33.5 g/dL (ref 30.0–36.0)
MCV: 90.3 fL (ref 80.0–100.0)
Monocytes Absolute: 0.4 10*3/uL (ref 0.1–1.0)
Monocytes Relative: 7 %
Neutro Abs: 4.3 10*3/uL (ref 1.7–7.7)
Neutrophils Relative %: 66 %
Platelet Count: 172 10*3/uL (ref 150–400)
RBC: 4.23 MIL/uL (ref 3.87–5.11)
RDW: 15.1 % (ref 11.5–15.5)
WBC Count: 6.5 10*3/uL (ref 4.0–10.5)
nRBC: 0 % (ref 0.0–0.2)

## 2023-04-27 LAB — CMP (CANCER CENTER ONLY)
ALT: 6 U/L (ref 0–44)
AST: 17 U/L (ref 15–41)
Albumin: 4.2 g/dL (ref 3.5–5.0)
Alkaline Phosphatase: 72 U/L (ref 38–126)
Anion gap: 4 — ABNORMAL LOW (ref 5–15)
BUN: 10 mg/dL (ref 8–23)
CO2: 30 mmol/L (ref 22–32)
Calcium: 9.8 mg/dL (ref 8.9–10.3)
Chloride: 97 mmol/L — ABNORMAL LOW (ref 98–111)
Creatinine: 0.68 mg/dL (ref 0.44–1.00)
GFR, Estimated: >60 mL/min (ref >60)
Glucose, Bld: 165 mg/dL — ABNORMAL HIGH (ref 70–99)
Potassium: 3.8 mmol/L (ref 3.5–5.1)
Sodium: 131 mmol/L — ABNORMAL LOW (ref 135–145)
Total Bilirubin: 0.5 mg/dL (ref 0.0–1.2)
Total Protein: 7.4 g/dL (ref 6.5–8.1)

## 2023-04-27 LAB — IRON AND IRON BINDING CAPACITY (CC-WL,HP ONLY)
Iron: 62 ug/dL (ref 28–170)
Saturation Ratios: 12 % (ref 10.4–31.8)
TIBC: 535 ug/dL — ABNORMAL HIGH (ref 250–450)
UIBC: 473 ug/dL — ABNORMAL HIGH (ref 148–442)

## 2023-04-27 NOTE — Progress Notes (Signed)
 [facility] Telephone:([phone]) [phone]   Fax:([phone]) [phone]  OFFICE PROGRESS NOTE  Tower, Manley Seeds, MD [address] [place] [address]  DIAGNOSIS:  1) Anemia secondary to GI occult blood loss.   2) suspicious for stage Ia (T1 a, N0, M0) lung cancer.  The patient declined bronchoscopy for tissue biopsy.  PRIOR THERAPY:  1) Oral iron  supplements. Discontinued due to intolerance (severe abdominal pain) 2) status post SBRT to the lung lesion under the care of Dr. Maida Sciara at [facility].  CURRENT THERAPY: IV iron  infusions with Venofer .   INTERVAL HISTORY: Christine Barton 73 y.o. female returns to the clinic today for follow-up visit.Discussed the use of AI scribe software for clinical note transcription with the patient, who gave verbal consent to proceed.  History of Present Illness   The patient, a 73 year old with a history of gastrointestinal blood loss leading to anemia and stage 1 non-small cell lung cancer, presents for a follow-up visit. Previously, the patient received iron  infusions, specifically venofer , to manage the anemia. She also underwent radiation therapy for the lung cancer under the care of a different physician.  Since the last visit four months ago, the patient reports an increase in coughing and some stomach trouble. She is scheduled to see a gastroenterologist, Dr. Nandigam, for the latter issue. Despite these symptoms, the patient expresses surprise and relief at the stability of her condition, as indicated by recent lab results and a chest scan. The scan showed no significant changes or new developments in the thoracic region, and the patient's hemoglobin levels were satisfactory at 12.8, indicating no current anemia.  The patient also mentions a change in medication, specifically being taken off a diuretic, which has caused some concern. However, overall, the patient appears to be managing well with her current treatment  regimen.       MEDICAL HISTORY: Past Medical History:  Diagnosis Date   Alcohol abuse, unspecified    Breast cancer (HCC) 1998   Right   Cataract    left eye   Cervical spondylosis 2006   MRI   Degeneration of cervical intervertebral disc 2006   MRI   Diabetes mellitus without complication (HCC)    Diverticulosis of colon (without mention of hemorrhage)    Hyperpotassemia    Lung cancer (HCC) 03/2021   started radiation in January 2023, right side   Microscopic hematuria    Mononeuritis of unspecified site    Nonspecific abnormal results of liver function study    Other abnormal glucose    Other and unspecified hyperlipidemia    no per pt   Other chronic nonalcoholic liver disease    Personal history of chemotherapy    Personal history of malignant neoplasm of breast    Personal history of radiation therapy    Pneumothorax, acute    right, spontaneous   Tobacco use disorder    Unspecified vitamin D  deficiency     ALLERGIES:  is allergic to anoro ellipta  [umeclidinium-vilanterol], bevespi  aerosphere [glycopyrrolate-formoterol ], oxycodone , crestor  [rosuvastatin ], and glipizide .  MEDICATIONS:  Current Outpatient Medications  Medication Sig Dispense Refill   acetaminophen  (TYLENOL ) 500 MG tablet Take 500 mg by mouth every 6 (six) hours as needed for moderate pain.     albuterol  (VENTOLIN  HFA) 108 (90 Base) MCG/ACT inhaler INHALE 2 PUFFS INTO THE LUNGS EVERY 4 HOURS AS NEEDED FOR WHEEZE OR FOR SHORTNESS OF BREATH 8.5 each 2   cholecalciferol (VITAMIN D3) 25 MCG (1000  UNIT) tablet Take 1,000 Units by mouth daily.     clorazepate  (TRANXENE ) 3.75 MG tablet Take 1 tablet (3.75 mg total) by mouth 3 (three) times daily as needed for anxiety. 90 tablet 2   cyclobenzaprine  (FLEXERIL ) 10 MG tablet TAKE 1/2 TABLET BY MOUTH AT BEDTIME AS NEEDED FOR MUSCLE SPASMS 15 tablet 3   desvenlafaxine  (PRISTIQ ) 50 MG 24 hr tablet TAKE 1 TABLET BY MOUTH EVERY DAY 90 tablet 1   lactulose   (CHRONULAC ) 10 GM/15ML solution Take 15 mLs (10 g total) by mouth See admin instructions. Take 10 g daily, may take a second 10 g dose as needed for constipation 1000 mL 2   Multiple Vitamin (MULTIVITAMIN) tablet Take 1 tablet by mouth daily.     Multiple Vitamins-Minerals (PRESERVISION AREDS 2) CAPS Take 1 capsule by mouth 2 (two) times daily.     mupirocin  ointment (BACTROBAN ) 2 % APPLY 1 APPLICATION TOPICALLY 2 TIMES DAILY AS NEEDED. 15 g 3   nadolol  (CORGARD ) 20 MG tablet Take 1 tablet (20 mg total) by mouth daily. 90 tablet 3   nystatin  (MYCOSTATIN ) 100000 UNIT/ML suspension TAKE 5 MLS (500,000 UNITS TOTAL) BY MOUTH 3 (THREE) TIMES DAILY. SWISH AND SWALLOW 120 mL 0   pantoprazole  (PROTONIX ) 40 MG tablet Take 1 tablet (40 mg total) by mouth daily. 90 tablet 3   rifaximin  (XIFAXAN ) 550 MG TABS tablet Take 1 tablet (550 mg total) by mouth 2 (two) times daily. 180 tablet 3   SYSTANE COMPLETE 0.6 % SOLN Apply 1 drop to eye 2 (two) times daily.     Tiotropium Bromide Monohydrate  (SPIRIVA  RESPIMAT) 2.5 MCG/ACT AERS Inhale 2 puffs into the lungs daily. 12 g 0   ursodiol  (ACTIGALL ) 300 MG capsule TAKE 1 CAPSULE BY MOUTH EVERY DAY 90 capsule 3   No current facility-administered medications for this visit.    SURGICAL HISTORY:  Past Surgical History:  Procedure Laterality Date   BIOPSY  07/27/2019   Procedure: BIOPSY;  Surgeon: Ace Holder, MD;  Location: [facility];  Service: Gastroenterology;;   BREAST BIOPSY  9/03   Right   BREAST LUMPECTOMY Right 1998   CHEST TUBE INSERTION  11/02/2014   COLONOSCOPY     COLONOSCOPY WITH PROPOFOL  N/A 07/27/2019   Procedure: COLONOSCOPY WITH PROPOFOL ;  Surgeon: Ace Holder, MD;  Location: [facility];  Service: Gastroenterology;  Laterality: N/A;   COLONOSCOPY WITH PROPOFOL  N/A 07/29/2022   Procedure: COLONOSCOPY WITH PROPOFOL ;  Surgeon: Sergio Dandy, MD;  Location: [facility];  Service: Gastroenterology;  Laterality: N/A;    ESOPHAGOGASTRODUODENOSCOPY (EGD) WITH PROPOFOL  N/A 10/30/2018   Procedure: ESOPHAGOGASTRODUODENOSCOPY (EGD) WITH PROPOFOL ;  Surgeon: Sergio Dandy, MD;  Location: [facility];  Service: Endoscopy;  Laterality: N/A;   ESOPHAGOGASTRODUODENOSCOPY (EGD) WITH PROPOFOL  N/A 03/12/2019   Procedure: ESOPHAGOGASTRODUODENOSCOPY (EGD) WITH PROPOFOL ;  Surgeon: Sergio Dandy, MD;  Location: [facility];  Service: Endoscopy;  Laterality: N/A;   ESOPHAGOGASTRODUODENOSCOPY (EGD) WITH PROPOFOL  N/A 07/27/2019   Procedure: ESOPHAGOGASTRODUODENOSCOPY (EGD) WITH PROPOFOL ;  Surgeon: Ace Holder, MD;  Location: [facility];  Service: Gastroenterology;  Laterality: N/A;   EYE SURGERY  02/2017   cataract extraction with lens implant-left   HEMOSTASIS CLIP PLACEMENT  07/29/2022   Procedure: HEMOSTASIS CLIP PLACEMENT;  Surgeon: Sergio Dandy, MD;  Location: [facility];  Service: Gastroenterology;;   HOT HEMOSTASIS N/A 07/27/2019   Procedure: HOT HEMOSTASIS (ARGON PLASMA COAGULATION/BICAP);  Surgeon: Ace Holder, MD;  Location: [facility];  Service: Gastroenterology;  Laterality: N/A;  MOUTH SURGERY     POLYPECTOMY  07/27/2019   Procedure: POLYPECTOMY;  Surgeon: Ace Holder, MD;  Location: [facility];  Service: Gastroenterology;;   POLYPECTOMY  07/29/2022   Procedure: POLYPECTOMY;  Surgeon: Sergio Dandy, MD;  Location: [facility];  Service: Gastroenterology;;   TUBAL LIGATION      REVIEW OF SYSTEMS:  A comprehensive review of systems was negative.   PHYSICAL EXAMINATION: General appearance: alert, cooperative, and no distress Head: Normocephalic, without obvious abnormality, atraumatic Neck: no adenopathy, no JVD, supple, symmetrical, trachea midline, and thyroid  not enlarged, symmetric, no tenderness/mass/nodules Lymph nodes: Cervical, supraclavicular, and axillary nodes normal. Resp: clear to auscultation bilaterally Back: symmetric, no curvature. ROM normal.  No CVA tenderness. Cardio: regular rate and rhythm, S1, S2 normal, no murmur, click, rub or gallop GI: soft, non-tender; bowel sounds normal; no masses,  no organomegaly Extremities: extremities normal, atraumatic, no cyanosis or edema  ECOG PERFORMANCE STATUS: 1 - Symptomatic but completely ambulatory  Blood pressure (!) 160/72, pulse 64, temperature (!) 97.5 F (36.4 C), temperature source Oral, resp. rate 16, height 5' 4.5" (1.638 m), weight 114 lb (51.7 kg), SpO2 100%.  LABORATORY DATA: Lab Results  Component Value Date   WBC 6.5 04/27/2023   HGB 12.8 04/27/2023   HCT 38.2 04/27/2023   MCV 90.3 04/27/2023   PLT 172 04/27/2023      Chemistry      Component Value Date/Time   NA 131 (L) 04/27/2023 1452   NA 132 (L) 05/26/2021 1147   K 3.8 04/27/2023 1452   CL 97 (L) 04/27/2023 1452   CO2 30 04/27/2023 1452   BUN 10 04/27/2023 1452   BUN 21 05/26/2021 1147   CREATININE 0.68 04/27/2023 1452      Component Value Date/Time   CALCIUM  9.8 04/27/2023 1452   ALKPHOS 72 04/27/2023 1452   AST 17 04/27/2023 1452   ALT 6 04/27/2023 1452   BILITOT 0.5 04/27/2023 1452       RADIOGRAPHIC STUDIES: CT Chest W Contrast Result Date: 04/26/2023 CLINICAL DATA:  Non-small-cell lung cancer staging. Right lower lobe. Radiation treatment. Intermittent cough and shortness of breath. Remote history of right breast cancer with lumpectomy and radiation. * Tracking Code: BO * EXAM: CT CHEST WITH CONTRAST TECHNIQUE: Multidetector CT imaging of the chest was performed during intravenous contrast administration. RADIATION DOSE REDUCTION: This exam was performed according to the departmental dose-optimization program which includes automated exposure control, adjustment of the mA and/or kV according to patient size and/or use of iterative reconstruction technique. CONTRAST:  75mL OMNIPAQUE  IOHEXOL  300 MG/ML  SOLN COMPARISON:  CT 12/20/2022 and older. FINDINGS: Cardiovascular: Heart is nonenlarged. No  significant pericardial effusion. Coronary artery calcifications are identified. The thoracic aorta has some partially calcified irregular plaque particularly along the arch and descending thoracic aorta. There is also plaque along the great vessels. There are areas of significant stenosis seen involving the right subclavian/axillary artery with a high-grade stenosis best seen on coronal series 4 image 44 and 45. There also stenosis involving the subclavian artery origin. Please correlate with any symptoms. The right vertebral artery may be occluded as well at the origin. Some enlargement of the pulmonary arteries. Mediastinum/Nodes: Slightly patulous thoracic esophagus. Heterogeneous thyroid . No specific abnormal lymph node enlargement identified in the axillary regions, hilum or mediastinum. There are surgical clips in the right axillary region. Lungs/Pleura: Once again in the right lung apex is some nodular pleural thickening with dystrophic calcifications and underlying lung distortion. Just caudal to  this was a subpleural nodular area which on the prior measured 10 x 6 mm and today on series 3, image 29 10 by 5 mm, similar when adjusting for technique. There are areas of pleural thickening along the anterior right hemithorax with a traction bronchiectasis and interstitial thickening. This could be scarring and fibrotic changes and could relate to the patient's prior breast radiation therapy. Elsewhere there are some dependent scarring and atelectatic changes of the lungs. The confluence opacity with nodularity seen in the superior segment of the right lower lobe with extension along the major fissure is again unchanged from previous. The discrete nodular area measured on the prior at 20 x 8 mm, today when measured in a similar fashion on series 3, image 49 measures 18 by 8 mm. Similar when adjusting for technique. No new dominant lung nodule. No pleural effusion or pneumothorax. Upper Abdomen: Adrenal glands  are preserved in the upper abdomen. There is a enlarged heterogeneous nodular liver. Please correlate for chronic liver disease. Portal vein is patent. Gallbladder is nondilated but has a numerous stones. Musculoskeletal: No chest wall abnormality. No acute or significant osseous findings. IMPRESSION: Overall no significant interval change. Stable subpleural nodular opacity along the right upper lobe with a distortion and calcifications. Stable opacity in the superior segment of the right lower lobe extending along the major fissure with the nodularity. Chronic lung changes elsewhere. No developing new mass lesion, fluid collection or lymph node enlargement in the thorax. Evidence of chronic liver disease.  Gallstones. Significant atherosclerotic changes identified with high-grade stenosis suggested of the right subclavian/axillary vessel as well as along the origin of the right subclavian artery. Possible occlusion of the right vertebral artery. Please correlate for known history or dedicated workup when appropriate Aortic Atherosclerosis (ICD10-I70.0) and Emphysema (ICD10-J43.9). Electronically Signed   By: Adrianna Horde M.D.   On: 04/26/2023 13:05    ASSESSMENT AND PLAN: This is a very pleasant 73 years old white female with iron  deficiency anemia secondary to likely chronic blood loss secondary to AV malformation as well as lack of dietary iron  supplements.  She has intolerance to the oral iron  tablets. She has a history of a stage Ia lung cancer status post SBRT to the right lower lobe lung nodule. She is currently on observation and he had repeat CT scan of the chest performed recently.  I personally and independently reviewed the scan images and discussed the result with the patient today.  Her scan showed no concerning findings for disease progression and she has stable pulmonary nodules.    Stage I non-small cell lung cancer Previously treated with radiation. Recent chest scan shows stable  subpleural nodular opacity in the right upper lobe and stable opacity in the severe segment of the right lower lobe. No new mass lesions, fluid collections, or lymphadenopathy. Reports increased coughing but expresses relief and satisfaction with stable scan results. - Continue observation with lab and scan - Schedule follow-up scan and lab work before next visit in six months  Anemia secondary to gastrointestinal blood loss Previously treated with iron  infusion (Venoferric). Current hemoglobin is 12.8 and hematocrit is 38.2, indicating no anemia at present. Awaiting iron  study results. Expresses satisfaction with treatment outcome and acknowledges improvement. - Monitor hemoglobin and hematocrit levels - Review iron  study results when available  Gastrointestinal issues Reports stomach trouble and is scheduled to see gastroenterologist Dr. Leonia Raman. - Follow-up with gastroenterologist Dr. Leonia Raman  General Health Maintenance Expresses satisfaction with current health status. - Schedule follow-up  visit in six months.   She was advised to call immediately if she has any other concerning symptoms in the interval. The patient voices understanding of current disease status and treatment options and is in agreement with the current care plan. All questions were answered. The patient knows to call the clinic with any problems, questions or concerns. We can certainly see the patient much sooner if necessary.   Disclaimer: This note was dictated with voice recognition software. Similar sounding words can inadvertently be transcribed and may not be corrected upon review.

## 2023-04-28 LAB — FERRITIN: Ferritin: 18 ng/mL (ref 11–307)

## 2023-04-29 ENCOUNTER — Other Ambulatory Visit: Payer: Self-pay | Admitting: Gastroenterology

## 2023-05-03 ENCOUNTER — Encounter: Payer: Self-pay | Admitting: Physician Assistant

## 2023-05-03 ENCOUNTER — Encounter: Payer: Self-pay | Admitting: Gastroenterology

## 2023-05-10 ENCOUNTER — Encounter: Payer: Self-pay | Admitting: Physician Assistant

## 2023-05-10 ENCOUNTER — Other Ambulatory Visit: Payer: Self-pay | Admitting: Gastroenterology

## 2023-05-10 ENCOUNTER — Ambulatory Visit
Admission: RE | Admit: 2023-05-10 | Discharge: 2023-05-10 | Disposition: A | Discharge status: Home / Self Care | Payer: Medicare HMO | Source: Ambulatory Visit | Attending: Gastroenterology | Admitting: Gastroenterology

## 2023-05-10 DIAGNOSIS — Z853 Personal history of malignant neoplasm of breast: Secondary | ICD-10-CM

## 2023-05-10 DIAGNOSIS — K7031 Alcoholic cirrhosis of liver with ascites: Secondary | ICD-10-CM

## 2023-05-10 DIAGNOSIS — C3490 Malignant neoplasm of unspecified part of unspecified bronchus or lung: Secondary | ICD-10-CM

## 2023-05-10 DIAGNOSIS — K8020 Calculus of gallbladder without cholecystitis without obstruction: Secondary | ICD-10-CM | POA: Diagnosis not present

## 2023-05-10 DIAGNOSIS — K7460 Unspecified cirrhosis of liver: Secondary | ICD-10-CM | POA: Diagnosis not present

## 2023-05-10 DIAGNOSIS — K838 Other specified diseases of biliary tract: Secondary | ICD-10-CM | POA: Diagnosis not present

## 2023-05-10 MED ORDER — GADOPICLENOL 0.5 MMOL/ML IV SOLN
5.0000 mL | Freq: Once | INTRAVENOUS | Status: AC | PRN
  Administered 2023-05-10: 5 mL via INTRAVENOUS

## 2023-05-11 ENCOUNTER — Encounter: Payer: Self-pay | Admitting: Gastroenterology

## 2023-05-11 ENCOUNTER — Ambulatory Visit (INDEPENDENT_AMBULATORY_CARE_PROVIDER_SITE_OTHER): Payer: Medicare HMO | Admitting: Gastroenterology

## 2023-05-11 VITALS — BP 130/60 | HR 67 | Ht 64.0 in | Wt 116.4 lb

## 2023-05-11 DIAGNOSIS — K5900 Constipation, unspecified: Secondary | ICD-10-CM

## 2023-05-11 DIAGNOSIS — R1013 Epigastric pain: Secondary | ICD-10-CM | POA: Diagnosis not present

## 2023-05-11 DIAGNOSIS — K7030 Alcoholic cirrhosis of liver without ascites: Secondary | ICD-10-CM | POA: Diagnosis not present

## 2023-05-11 DIAGNOSIS — K7682 Hepatic encephalopathy: Secondary | ICD-10-CM | POA: Diagnosis not present

## 2023-05-11 DIAGNOSIS — K5904 Chronic idiopathic constipation: Secondary | ICD-10-CM

## 2023-05-11 DIAGNOSIS — K7290 Hepatic failure, unspecified without coma: Secondary | ICD-10-CM | POA: Diagnosis not present

## 2023-05-11 DIAGNOSIS — K8020 Calculus of gallbladder without cholecystitis without obstruction: Secondary | ICD-10-CM | POA: Diagnosis not present

## 2023-05-11 NOTE — Patient Instructions (Addendum)
Please purchase the following medications over the counter and take as directed: Miralax 1/2 capful daily.   You can continue Lactulose as needed.   VISIT SUMMARY:  During today's visit, we discussed the management of your liver disease, reviewed your recent MRI results, and addressed your concerns about medication and symptoms. Your liver function is stable, and we have a plan to manage your symptoms and maintain your health.  YOUR PLAN:  -LIVER CIRRHOSIS: Liver cirrhosis is a condition where the liver is scarred and permanently damaged. Your liver function is stable, and you should continue taking Xifaxan, nadolol, and ursodiol as prescribed. Regular blood work will help [place] monitor your liver function.  -HEPATIC ENCEPHALOPATHY: Hepatic encephalopathy is a decline in brain function due to severe liver disease. Continue taking Xifaxan and use lactulose as needed to manage symptoms. Start taking MiraLAX daily to prevent constipation, and reduce lactulose to 2 tablespoons if using MiraLAX.  -GALLSTONES: Gallstones are hardened deposits in the gallbladder that can cause pain. Continue taking ursodiol to help dissolve the stones and monitor for any changes in pain or symptoms.  -CONSTIPATION: Constipation is difficulty in passing stools. Start taking MiraLAX daily and reduce lactulose to 2 tablespoons if using MiraLAX. If constipation persists, increase the lactulose dosage.  -GENERAL HEALTH MAINTENANCE: Maintain adequate hydration and a healthy diet. It is crucial to avoid alcohol consumption to prevent further liver damage.  INSTRUCTIONS:  We will review your MRI results once they are available. Please schedule a follow-up appointment in six months.  _______________________________________________________  If your blood pressure at your visit was 140/90 or greater, please contact your primary care physician to follow up on this.  _______________________________________________________  If  you are age 73 or older, your body mass index should be between 23-30. Your Body mass index is 19.98 kg/m. If this is out of the aforementioned range listed, please consider follow up with your Primary Care Provider.  If you are age 9 or younger, your body mass index should be between 19-25. Your Body mass index is 19.98 kg/m. If this is out of the aformentioned range listed, please consider follow up with your Primary Care Provider.   ________________________________________________________  The [facility] providers would like to encourage you to use [facility] to communicate with providers for non-urgent requests or questions.  Due to long hold times on the telephone, sending your provider a message by [facility] may be a faster and more efficient way to get a response.  Please allow 48 business hours for a response.  Please remember that this is for non-urgent requests.  _______________________________________________________

## 2023-05-11 NOTE — Progress Notes (Signed)
Christine Barton    [id number]    November 25, 1950  Primary Care Physician:Tower, Audrie Gallus, MD  Referring Physician: Tower, Audrie Gallus, MD [address] [place],  [place] [address]   Chief complaint: Cirrhosis  Discussed the use of AI scribe software for clinical note transcription with the patient, who gave verbal consent to proceed.  History of Present Illness   73 year old very pleasant female patient, with alcoholic cirrhosis, presents with concerns regarding liver disease management and recent MRI results.  The patient is concerned about the management of their liver disease, particularly regarding the use of Xifaxan and the potential for developing immunity to it. They have been taking Xifaxan, nadolol, ursodiol, and lactulose as needed for their condition. Their liver numbers are stable, but there are concerns about sodium and blood glucose levels.  There is worry about the impact of their liver condition on their mental state, specifically the risk of hepatic encephalopathy. They have been using lactulose to manage symptoms of hepatic encephalopathy.  They have a history of alcoholic cirrhosis and report experiencing pain in the area of their gallbladder and kidney, which they attribute to gallstones and constipation. Ursodiol is being used to manage gallstones.  Constipation is being managed with lactulose and recently started MiraLAX to help with bowel movements. Their weight has been stable around 113 pounds, with some fluctuations.  They have been experiencing cramps and pain following a recent MRI, which they have not experienced before. They are awaiting the results of this MRI to understand more about their current condition.  No alcohol consumption, which is significant given their history of alcoholic cirrhosis.      GI Hx:  Colonoscopy 07-29-22 - Hemorrhoids found on perianal exam.  - Five 4 to 7 mm polyps in the transverse colon, in the ascending colon and  in the cecum, removed with a cold snare. Resected and retrieved. Clips were placed. Clip manufacturer: [employer].  - Diverticulosis in the sigmoid colon, in the descending colon, in the transverse colon and in the ascending colon.  - Non-bleeding internal hemorrhoids. A. COLON, CECUM, ASCENDING, POLYPECTOMY:  - Tubular adenoma (one fragment)  - Sessile serrated polyp without cytologic dysplasia (one fragment)  - Colonic mucosa with surface hyperplastic changes (multiple fragments)  - Negative for high-grade dysplasia or malignancy   B. COLON, TRANSVERSE, POLYPECTOMY:  - Tubular adenoma(s) (two fragments)  - Negative for high-grade dysplasia or malignancy   MR abdomen w wo contrast 01-12-22 1. No definite evidence of metastatic disease in the abdomen. 2. Advanced cirrhosis with developing hepatic fibrosis in a background of hepatic iron deposition. 3. Cholelithiasis without evidence of acute cholecystitis. 4. Chronic dilatation of the common bile duct. No choledocholithiasis or definite findings to suggest obstruction.   MRI abdomen/liver December 31, 2020 1. Cirrhosis. No liver masses. 2. Trace perihepatic ascites.  Normal size spleen. 3. Cholelithiasis. Chronic mild central intrahepatic and extrahepatic biliary ductal dilatation, unchanged. CBD diameter 9 mm. No evidence of choledocholithiasis. 4. Chronic mild dilation of the dorsal pancreatic duct in the pancreatic head, unchanged, nonspecific. No pancreatic mass.   Abdominal ultrasound April 30, 2020 1. Cirrhotic liver morphology.  No focal liver lesion is identified. 2. Cholelithiasis with a positive sonographic Murphy sign. Findings raise suspicion for acute cholecystitis.   Normal AFP 3.7 in January 2022 MRI liver 01-08-2020: Stable cirrhosis with no evidence of hepatocellular carcinoma. Choledocholithiasis with stable CBD. Increased iron deposition in the setting of  IV iron therapy   EGD July 27, 2019 by Dr.  Adela Lank: Small esophageal varices and portal hypertensive gastropathy otherwise normal exam   Colonoscopy July 27, 2019: 8 small AVMs scattered in the colon ablated with APC and 3 small polyps removed, diverticulosis and internal hemorrhoids   EGD February 23, 2019: Small grade 1 esophageal varices, portal hypertensive gastropathy   Colonoscopy November 30, 2016: Diverticulosis, sessile polyps X4 tubular adenomas and internal hemorrhoids.  Recall colonoscopy in 3 years    Outpatient Encounter Medications as of 05/11/2023  Medication Sig   acetaminophen (TYLENOL) 500 MG tablet Take 500 mg by mouth every 6 (six) hours as needed for moderate pain.   albuterol (VENTOLIN HFA) 108 (90 Base) MCG/ACT inhaler INHALE 2 PUFFS INTO THE LUNGS EVERY 4 HOURS AS NEEDED FOR WHEEZE OR FOR SHORTNESS OF BREATH   cholecalciferol (VITAMIN D3) 25 MCG (1000 UNIT) tablet Take 1,000 Units by mouth daily.   clorazepate (TRANXENE) 3.75 MG tablet Take 1 tablet (3.75 mg total) by mouth 3 (three) times daily as needed for anxiety.   cyclobenzaprine (FLEXERIL) 10 MG tablet TAKE 1/2 TABLET BY MOUTH AT BEDTIME AS NEEDED FOR MUSCLE SPASMS   desvenlafaxine (PRISTIQ) 50 MG 24 hr tablet TAKE 1 TABLET BY MOUTH EVERY DAY   lactulose (CONSTULOSE) 10 GM/15ML solution Take 15 mLs (10 g total) by mouth 2 (two) times daily as needed for severe constipation.   Multiple Vitamin (MULTIVITAMIN) tablet Take 1 tablet by mouth daily.   Multiple Vitamins-Minerals (PRESERVISION AREDS 2) CAPS Take 1 capsule by mouth 2 (two) times daily.   mupirocin ointment (BACTROBAN) 2 % APPLY 1 APPLICATION TOPICALLY 2 TIMES DAILY AS NEEDED.   nadolol (CORGARD) 20 MG tablet Take 1 tablet (20 mg total) by mouth daily.   nystatin (MYCOSTATIN) 100000 UNIT/ML suspension TAKE 5 MLS (500,000 UNITS TOTAL) BY MOUTH 3 (THREE) TIMES DAILY. SWISH AND SWALLOW   pantoprazole (PROTONIX) 40 MG tablet Take 1 tablet (40 mg total) by mouth daily. (Patient taking differently:  Take 80 mg by mouth daily.)   rifaximin (XIFAXAN) 550 MG TABS tablet Take 1 tablet (550 mg total) by mouth 2 (two) times daily.   SYSTANE COMPLETE 0.6 % SOLN Apply 1 drop to eye 2 (two) times daily.   Tiotropium Bromide Monohydrate (SPIRIVA RESPIMAT) 2.5 MCG/ACT AERS Inhale 2 puffs into the lungs daily.   ursodiol (ACTIGALL) 300 MG capsule TAKE 1 CAPSULE BY MOUTH EVERY DAY   No facility-administered encounter medications on file as of 05/11/2023.    Allergies as of 05/11/2023 - Review Complete 05/11/2023  Allergen Reaction Noted   Anoro ellipta [umeclidinium-vilanterol] Cough 08/03/2022   Bevespi aerosphere [glycopyrrolate-formoterol] Cough 08/03/2022   Oxycodone Nausea And Vomiting 11/02/2014   Crestor [rosuvastatin]  11/12/2020   Glipizide Other (See Comments) 09/12/2013    Past Medical History:  Diagnosis Date   Alcohol abuse, unspecified    Breast cancer (HCC) 1998   Right   Cataract    left eye   Cervical spondylosis 2006   MRI   Degeneration of cervical intervertebral disc 2006   MRI   Diabetes mellitus without complication (HCC)    Diverticulosis of colon (without mention of hemorrhage)    Hyperpotassemia    Lung cancer (HCC) 03/2021   started radiation in January 2023, right side   Microscopic hematuria    Mononeuritis of unspecified site    Nonspecific abnormal results of liver function study    Other abnormal glucose    Other and unspecified hyperlipidemia  no per pt   Other chronic nonalcoholic liver disease    Personal history of chemotherapy    Personal history of malignant neoplasm of breast    Personal history of radiation therapy    Pneumothorax, acute    right, spontaneous   Tobacco use disorder    Unspecified vitamin D deficiency     Past Surgical History:  Procedure Laterality Date   BIOPSY  07/27/2019   Procedure: BIOPSY;  Surgeon: Benancio Deeds, MD;  Location: [facility];  Service: Gastroenterology;;   BREAST BIOPSY  9/03   Right    BREAST LUMPECTOMY Right 1998   CHEST TUBE INSERTION  11/02/2014   COLONOSCOPY     COLONOSCOPY WITH PROPOFOL N/A 07/27/2019   Procedure: COLONOSCOPY WITH PROPOFOL;  Surgeon: Benancio Deeds, MD;  Location: [facility];  Service: Gastroenterology;  Laterality: N/A;   COLONOSCOPY WITH PROPOFOL N/A 07/29/2022   Procedure: COLONOSCOPY WITH PROPOFOL;  Surgeon: Napoleon Form, MD;  Location: [facility];  Service: Gastroenterology;  Laterality: N/A;   ESOPHAGOGASTRODUODENOSCOPY (EGD) WITH PROPOFOL N/A 10/30/2018   Procedure: ESOPHAGOGASTRODUODENOSCOPY (EGD) WITH PROPOFOL;  Surgeon: Napoleon Form, MD;  Location: [facility];  Service: Endoscopy;  Laterality: N/A;   ESOPHAGOGASTRODUODENOSCOPY (EGD) WITH PROPOFOL N/A 03/12/2019   Procedure: ESOPHAGOGASTRODUODENOSCOPY (EGD) WITH PROPOFOL;  Surgeon: Napoleon Form, MD;  Location: [facility];  Service: Endoscopy;  Laterality: N/A;   ESOPHAGOGASTRODUODENOSCOPY (EGD) WITH PROPOFOL N/A 07/27/2019   Procedure: ESOPHAGOGASTRODUODENOSCOPY (EGD) WITH PROPOFOL;  Surgeon: Benancio Deeds, MD;  Location: [facility];  Service: Gastroenterology;  Laterality: N/A;   EYE SURGERY  02/2017   cataract extraction with lens implant-left   HEMOSTASIS CLIP PLACEMENT  07/29/2022   Procedure: HEMOSTASIS CLIP PLACEMENT;  Surgeon: Napoleon Form, MD;  Location: [facility];  Service: Gastroenterology;;   HOT HEMOSTASIS N/A 07/27/2019   Procedure: HOT HEMOSTASIS (ARGON PLASMA COAGULATION/BICAP);  Surgeon: Benancio Deeds, MD;  Location: [facility];  Service: Gastroenterology;  Laterality: N/A;   MOUTH SURGERY     POLYPECTOMY  07/27/2019   Procedure: POLYPECTOMY;  Surgeon: Benancio Deeds, MD;  Location: [facility];  Service: Gastroenterology;;   POLYPECTOMY  07/29/2022   Procedure: POLYPECTOMY;  Surgeon: Napoleon Form, MD;  Location: [facility];  Service: Gastroenterology;;   TUBAL LIGATION      Family History  Problem  Relation Age of Onset   Heart failure Father    Heart attack Father    Colon cancer Maternal Uncle    Stroke Mother    Esophageal cancer Neg Hx    Rectal cancer Neg Hx    Stomach cancer Neg Hx    Pancreatic cancer Neg Hx     Social History   Socioeconomic History   Marital status: Single    Spouse name: Not on file   Number of children: 1   Years of education: Not on file   Highest education level: Not on file  Occupational History   Occupation: retired    Associate Professor: [employer]  Tobacco Use   Smoking status: Every Day    Current packs/day: 1.00    Average packs/day: 1 pack/day for 58.5 years (58.5 ttl pk-yrs)    Types: Cigarettes    Start date: 09/25/2021    Passive exposure: Past   Smokeless tobacco: Never   Tobacco comments:    Started smoking at 73 years old.     1PPD 12/20/2022 khj  Vaping Use   Vaping status: Never Used  Substance and Sexual Activity  Alcohol use: Not Currently   Drug use: No   Sexual activity: Not Currently  Other Topics Concern   Not on file  Social History Narrative   Divorced      1 child      Works at [employer]         Social Drivers of [employer]: Low Risk  (04/01/2023)   Overall Financial Resource Strain (CARDIA)    Difficulty of Paying Living Expenses: Not hard at all  Food Insecurity: No Food Insecurity (04/01/2023)   Hunger Vital Sign    Worried About Running Out of Food in the Last Year: Never true    Ran Out of Food in the Last Year: Never true  Recent Concern: Food Insecurity - Food Insecurity Present (03/03/2023)   Hunger Vital Sign    Worried About Running Out of Food in the Last Year: Never true    Ran Out of Food in the Last Year: Sometimes true  Transportation Needs: No Transportation Needs (04/01/2023)   PRAPARE - Administrator, Civil Service (Medical): No    Lack of Transportation (Non-Medical): No  Physical Activity: Inactive (04/01/2023)   Exercise Vital Sign     Days of Exercise per Week: 0 days    Minutes of Exercise per Session: 0 min  Stress: No Stress Concern Present (04/01/2023)   [employer] of Occupational Health - Occupational Stress Questionnaire    Feeling of Stress : Not at all  Social Connections: Unknown (04/01/2023)   Social Connection and Isolation Panel [NHANES]    Frequency of Communication with Friends and Family: Never    Frequency of Social Gatherings with Friends and Family: Never    Attends Religious Services: Never    Database administrator or Organizations: No    Attends Banker Meetings: Never    Marital Status: Not on file  Intimate Partner Violence: Not At Risk (04/01/2023)   Humiliation, Afraid, Rape, and Kick questionnaire    Fear of Current or Ex-Partner: No    Emotionally Abused: No    Physically Abused: No    Sexually Abused: No      Review of systems: All other review of systems negative except as mentioned in the HPI.   Physical Exam: Vitals:   05/11/23 1109  BP: 130/60  Pulse: 67   Body mass index is 19.98 kg/m. Gen:      No acute distress HEENT:  sclera anicteric CV: s1s2 rrr, no murmur Lungs: B/l clear. Abd:      soft, non-tender; no palpable masses, no distension Ext:    No edema Neuro: alert and oriented x 3 Psych: normal mood and affect  Data Reviewed:  Reviewed labs, radiology imaging, old records and pertinent past GI work up     Assessment and Plan    Liver Cirrhosis Chronic liver disease with well-managed liver function. Recent blood work shows stable liver enzymes. No new liver-related symptoms. Current medications include Xifaxan, nadolol, and ursodiol, which are well-tolerated. Emphasized the importance of continuing these medications to maintain stability and improve outcomes.  Small esophageal varices on nadolol 20 mg daily for secondary prophylaxis   No significant ascites or volume overload on exam    Hepatic Encephalopathy Concerns about  potential immunity to Xifaxan addressed; explained this is unlikely. Emphasized lactulose's role in managing encephalopathy, especially during constipation or infections. Discussed Xifaxan and lactulose's role in reducing ammonia levels. - Continue Xifaxan - Use lactulose as needed -  Start MiraLAX daily to prevent constipation - Reduce lactulose to 2 tablespoons if using MiraLAX Advised patient to hold lactulose if she is having diarrhea   No clinical evidence of hepatic decompensation Overall stable from cirrhosis standpoint   Epigastric abdominal pain: Secondary to large symptomatic gallstone, she is not a surgical candidate Continue ursodiol 300 mg daily monitor symptoms  pantoprazole to 40 mg once daily   Gallstones Intermittent pain due to gallstones. Ursodiol prescribed to dissolve stones. Pain is persistent but not severe. Discussed ursodiol's role in dissolving gallstones. - Continue ursodiol - Monitor for changes in pain or symptoms  Constipation Constipation exacerbated by antidepressants, causing hard stools and occasional gas cramps. Lactulose effective but causes bloating at higher doses. Discussed using MiraLAX daily and adjusting lactulose dosage accordingly. - Start MiraLAX daily - Reduce lactulose to 2 tablespoons if using MiraLAX - Increase lactulose if constipation persists  General Health Maintenance Advised to maintain hydration and a healthy diet. Emphasized no alcohol consumption to prevent further liver damage. - Encourage adequate water intake - Encourage healthy diet - Avoid alcohol consumption  Follow-up - Review MRI results once available - Schedule follow-up in six months.       This visit required >40 minutes of patient care (this includes precharting, chart review, review of results, face-to-face time used for counseling as well as treatment plan and follow-up. The patient was provided an opportunity to ask questions and all were answered. The  patient agreed with the plan and demonstrated an understanding of the instructions.  Iona Beard , MD    CC: Tower, Audrie Gallus, MD

## 2023-05-13 ENCOUNTER — Encounter: Payer: Self-pay | Admitting: Gastroenterology

## 2023-05-16 ENCOUNTER — Encounter: Payer: Self-pay | Admitting: Pulmonary Disease

## 2023-05-16 ENCOUNTER — Telehealth: Payer: Self-pay | Admitting: Gastroenterology

## 2023-05-16 NOTE — Telephone Encounter (Signed)
I have responded to the patient through another patient message from today.  Closing this encounter.

## 2023-05-16 NOTE — Telephone Encounter (Signed)
Inbound call from patient requesting a call to discuss comments on her mychart that were left after her visit on 1.29. Please advise, thank you.

## 2023-05-17 ENCOUNTER — Telehealth: Payer: Self-pay

## 2023-05-17 ENCOUNTER — Other Ambulatory Visit (HOSPITAL_COMMUNITY): Payer: Self-pay

## 2023-05-17 NOTE — Telephone Encounter (Signed)
 Pharmacy Patient Advocate Encounter   Received notification from CoverMyMeds that prior authorization for Spiriva  Respimat 2.5MCG/ACT aerosol is required/requested.   Insurance verification completed.   The patient is insured through [facility] Medicare .   Per test claim: PA required; PA submitted to above mentioned insurance via CoverMyMeds Key/confirmation #/EOC [id number] Status is pending

## 2023-05-17 NOTE — Telephone Encounter (Signed)
PA request has been Submitted. New Encounter created for follow up. For additional info see Pharmacy Prior Auth telephone encounter from 05-17-2023.

## 2023-05-17 NOTE — Telephone Encounter (Signed)
Mailbox is full and cannot accept any messages at this time.

## 2023-05-17 NOTE — Telephone Encounter (Signed)
Spoke with the patient. She is doing well and appreciates Dr Lavon Paganini spending time with her to explain and answer her questions. Patient is interested in seeing her MRI report. Assured the patient we will contact her asap with those results.

## 2023-05-20 NOTE — Telephone Encounter (Signed)
 Pharmacy Patient Advocate Encounter  Received notification from [facility] that Prior Authorization for Spiriva  Respimat 2.5MCG/ACT aerosol has been APPROVED from 04-13-2023 to 04-11-2024   PA #/Case ID/Reference #: [id number]

## 2023-05-20 NOTE — Telephone Encounter (Signed)
ATC pt- unable to leave vm due to mailbox being full.  Will call back

## 2023-05-23 ENCOUNTER — Encounter: Payer: Self-pay | Admitting: Physician Assistant

## 2023-05-24 ENCOUNTER — Telehealth: Payer: Medicare HMO | Admitting: Adult Health

## 2023-05-25 NOTE — Telephone Encounter (Signed)
Pt is aware of below message and voiced her understanding. Nothing further needed.

## 2023-06-11 ENCOUNTER — Encounter: Payer: Self-pay | Admitting: Gastroenterology

## 2023-07-12 ENCOUNTER — Telehealth: Payer: Self-pay | Admitting: Family Medicine

## 2023-07-12 DIAGNOSIS — K7031 Alcoholic cirrhosis of liver with ascites: Secondary | ICD-10-CM

## 2023-07-12 DIAGNOSIS — R7303 Prediabetes: Secondary | ICD-10-CM

## 2023-07-12 NOTE — Telephone Encounter (Signed)
 Pt notified of error, she said she can't "pee on demand" and asked if she could pick up a urine cup. Instructions given and pt will drop sample off when able

## 2023-07-12 NOTE — Telephone Encounter (Signed)
 Please let pt know that at her convenience , I need to repeat her urine microalbumin test that was last done in September  With a re calculation it was on the borderline for elevated protein and I want to confirm it   If it comes back elevated , there is a low dose medication we can start to protect kidneys   I put the future order in   Thanks

## 2023-07-19 ENCOUNTER — Other Ambulatory Visit (INDEPENDENT_AMBULATORY_CARE_PROVIDER_SITE_OTHER): Payer: Self-pay

## 2023-07-19 DIAGNOSIS — K7031 Alcoholic cirrhosis of liver with ascites: Secondary | ICD-10-CM

## 2023-07-19 DIAGNOSIS — R7303 Prediabetes: Secondary | ICD-10-CM

## 2023-07-20 ENCOUNTER — Encounter: Payer: Self-pay | Admitting: Family Medicine

## 2023-07-20 LAB — MICROALBUMIN / CREATININE URINE RATIO
Creatinine,U: 79.2 mg/dL
Microalb Creat Ratio: 121.4 mg/g — ABNORMAL HIGH (ref 0.0–30.0)
Microalb, Ur: 9.6 mg/dL — ABNORMAL HIGH (ref 0.0–1.9)

## 2023-07-21 ENCOUNTER — Telehealth: Payer: Self-pay | Admitting: *Deleted

## 2023-07-21 NOTE — Telephone Encounter (Signed)
 Per Dr. Milinda Antis pt needs an appt to f/u from urine test. She doesn't need a lab appt prior we will do labs at the appt., please schedule f/u with PCP to discuss urine lab results.

## 2023-07-21 NOTE — Telephone Encounter (Signed)
 Copied from CRM [id number]. Topic: General - Other >> Jul 21, 2023  9:56 AM Presley Raddle C wrote: Reason for CRM: patient called to schedule her follow-up appt. However, she stated that typically she completes labs after her appt and asked for a message to be sent back tot e clinic to confirm if she needs to fast or not. Please call and advise.

## 2023-07-21 NOTE — Telephone Encounter (Signed)
 Spoke to pt, scheduled ov for tomorrow 4/11

## 2023-07-22 ENCOUNTER — Ambulatory Visit (INDEPENDENT_AMBULATORY_CARE_PROVIDER_SITE_OTHER): Admitting: Family Medicine

## 2023-07-22 ENCOUNTER — Encounter: Payer: Self-pay | Admitting: Family Medicine

## 2023-07-22 VITALS — BP 136/64 | HR 71 | Temp 97.8°F | Ht 64.0 in | Wt 116.0 lb

## 2023-07-22 DIAGNOSIS — K7031 Alcoholic cirrhosis of liver with ascites: Secondary | ICD-10-CM

## 2023-07-22 DIAGNOSIS — S51811A Laceration without foreign body of right forearm, initial encounter: Secondary | ICD-10-CM | POA: Diagnosis not present

## 2023-07-22 DIAGNOSIS — R7303 Prediabetes: Secondary | ICD-10-CM | POA: Diagnosis not present

## 2023-07-22 DIAGNOSIS — F418 Other specified anxiety disorders: Secondary | ICD-10-CM

## 2023-07-22 DIAGNOSIS — R809 Proteinuria, unspecified: Secondary | ICD-10-CM

## 2023-07-22 DIAGNOSIS — F17200 Nicotine dependence, unspecified, uncomplicated: Secondary | ICD-10-CM

## 2023-07-22 NOTE — Assessment & Plan Note (Signed)
?   If adding to microalbuminuria  No clinical changes  Reviewed last Gi note  Lab today incl LFT and protein level   Conitnues nadolol and xifaxan

## 2023-07-22 NOTE — Assessment & Plan Note (Signed)
 In pt with prediabetes, cirrhosis and malnutrition   Reviewed ratio ot 121.4 (elevated)   Lab today  Consider ARB / blood pressure would likely tolerate

## 2023-07-22 NOTE — Assessment & Plan Note (Signed)
 No change in status  Not ready to quit

## 2023-07-22 NOTE — Assessment & Plan Note (Signed)
 Much improved with psychiatry care and counseling  Taking pristiq 50 mg daily   Reviewed stressors/ coping techniques/symptoms/ support sources/ tx options and side effects in detail today

## 2023-07-22 NOTE — Progress Notes (Signed)
 Subjective:    Patient ID: Christine Barton, female    DOB: 1950-10-16, 73 y.o.   MRN: [id number]  HPI  Wt Readings from Last 3 Encounters:  07/22/23 116 lb (52.6 kg)  05/11/23 116 lb 6.4 oz (52.8 kg)  04/27/23 114 lb (51.7 kg)   19.91 kg/m  Vitals:   07/22/23 1423  BP: 136/64  Pulse: 71  Temp: 97.8 F (36.6 C)  SpO2: 97%    Pt presents for follow up of elevated microalbumin  Also re check arm from injury (skin tear from the late fall) - took a long time to heal Some dark areas left but feels fine   Feeling fairly good overall  Some fatigue   Eating better  Getting protein  Likes meat  Milk  Fish  Eggs Peanut butter   Smoking is the same      Lab Results  Component Value Date   MICROALBUR 9.6 (H) 07/19/2023   MICROALBUR 1.2 12/16/2022  Ratio of 121.4    (was prevention 30 with calc adjustment)   History of cirrhosis and NASH  Sees GI  Takes nadolol, xifaxan   BP Readings from Last 3 Encounters:  07/22/23 136/64  05/11/23 130/60  04/27/23 (!) 160/72   Pulse Readings from Last 3 Encounters:  07/22/23 71  05/11/23 67  04/27/23 64      Also prediabetes Lab Results  Component Value Date   HGBA1C 5.8 12/16/2022   HGBA1C 6.6 (H) 12/21/2021   HGBA1C 5.7 (A) 06/04/2021    Also malnutrition / low protein in past     Lab Results  Component Value Date   NA 131 (L) 04/27/2023   K 3.8 04/27/2023   CO2 30 04/27/2023   GLUCOSE 165 (H) 04/27/2023   BUN 10 04/27/2023   CREATININE 0.68 04/27/2023   CALCIUM 9.8 04/27/2023   GFR 72.73 12/16/2022   EGFR 74 05/26/2021   GFRNONAA >60 04/27/2023   Lab Results  Component Value Date   ALT 6 04/27/2023   AST 17 04/27/2023   ALKPHOS 72 04/27/2023   BILITOT 0.5 04/27/2023   Lab Results  Component Value Date   LABPROT 11.8 10/09/2021      07/22/2023    2:35 PM 04/01/2023    2:31 PM 01/31/2023   12:50 PM 12/29/2022   11:30 AM 12/06/2022   10:07 AM  Depression screen PHQ 2/9  Decreased  Interest 0 0 0 0 1  Down, Depressed, Hopeless 0 0 0 0 1  PHQ - 2 Score 0 0 0 0 2  Altered sleeping 0  0 0 1  Tired, decreased energy 1  1 0 1  Change in appetite 1  0 2 1  Feeling bad or failure about yourself  0  0 0 1  Trouble concentrating 0  0 0 1  Moving slowly or fidgety/restless 0  0 0 1  Suicidal thoughts 0  0 0 0  PHQ-9 Score 2  1 2 8   Difficult doing work/chores Not difficult at all Not difficult at all Not difficult at all Not difficult at all Somewhat difficult      07/22/2023    2:36 PM 01/31/2023   12:50 PM 12/29/2022   11:30 AM 12/06/2022   10:08 AM  GAD 7 : Generalized Anxiety Score  Nervous, Anxious, on Edge 0 0 0 1  Control/stop worrying 0 0 0 1  Worry too much - different things 0 0 0 1  Trouble relaxing 0 0 0  1  Restless 0 0 0 1  Easily annoyed or irritable 0 0 0 1  Afraid - awful might happen 3 0 0 1  Total GAD 7 Score 3 0 0 7  Anxiety Difficulty Not difficult at all  Not difficult at all Somewhat difficult  Taking geneirc pristiq and doing so much better   Did counseling -was helpful  d    Some worry about politics         Patient Active Problem List   Diagnosis Date Noted   Depression with anxiety 07/22/2023   Microalbuminuria 07/22/2023   Skin tear of forearm without complication, right, initial encounter 01/31/2023   Laceration of left index finger 01/31/2023   Food insecurity 01/25/2023   Current use of proton pump inhibitor 12/14/2022   Osteopenia 06/17/2022   Pulmonary emphysema (HCC) 09/03/2021   Agatston CAC score, >400 07/02/2021   Left-sided chest wall pain 06/04/2021   Lung cancer (HCC) 02/25/2021   Iron deficiency anemia 01/01/2021   Iron deficiency anemia due to chronic blood loss 11/06/2020   AVM (arteriovenous malformation) of colon    Benign neoplasm of colon    NASH (nonalcoholic steatohepatitis)    Malnutrition (HCC) 07/25/2019   HSV infection 02/23/2019   Aortic atherosclerosis (HCC) 01/22/2019   Coronary  atherosclerosis 01/22/2019   Encounter for screening for lung cancer 01/04/2019   Venous insufficiency 11/15/2018   Esophageal varices without bleeding (HCC) 10/27/2018   Anemia 10/23/2018   Marasmus (HCC) 07/26/2018   Poor balance 05/30/2018   Cirrhosis of liver (HCC) 04/24/2018   Gallstones 04/24/2018   Heme positive stool 04/24/2018   Smoker 11/07/2017   Welcome to Medicare preventive visit 10/26/2016   Estrogen deficiency 10/26/2016   Colon cancer screening 11/01/2014   Encounter for routine gynecological examination 09/12/2013   Left shoulder pain 08/20/2011   Routine general medical examination at a health care facility 07/21/2011   Vitamin D deficiency 08/04/2009   POSTMENOPAUSAL STATUS 08/04/2009   Prediabetes 02/18/2009   Hyperlipidemia associated with type 2 diabetes mellitus (HCC) 06/14/2008   DISC DISEASE, CERVICAL 03/30/2007   History of alcohol abuse 12/06/2006   Neuropathy of both feet 12/06/2006   DIVERTICULOSIS, COLON 12/06/2006   Fatty liver 12/06/2006   BREAST CANCER, HX OF 12/06/2006   Past Medical History:  Diagnosis Date   Alcohol abuse, unspecified    Breast cancer (HCC) 1998   Right   Cataract    left eye   Cervical spondylosis 2006   MRI   Degeneration of cervical intervertebral disc 2006   MRI   Diabetes mellitus without complication (HCC)    Diverticulosis of colon (without mention of hemorrhage)    Hyperpotassemia    Lung cancer (HCC) 03/2021   started radiation in January 2023, right side   Microscopic hematuria    Mononeuritis of unspecified site    Nonspecific abnormal results of liver function study    Other abnormal glucose    Other and unspecified hyperlipidemia    no per pt   Other chronic nonalcoholic liver disease    Personal history of chemotherapy    Personal history of malignant neoplasm of breast    Personal history of radiation therapy    Pneumothorax, acute    right, spontaneous   Tobacco use disorder    Unspecified  vitamin D deficiency    Past Surgical History:  Procedure Laterality Date   BIOPSY  07/27/2019   Procedure: BIOPSY;  Surgeon: Benancio Deeds, MD;  Location: [facility];  Service: Gastroenterology;;   BREAST BIOPSY  9/03   Right   BREAST LUMPECTOMY Right 1998   CHEST TUBE INSERTION  11/02/2014   COLONOSCOPY     COLONOSCOPY WITH PROPOFOL N/A 07/27/2019   Procedure: COLONOSCOPY WITH PROPOFOL;  Surgeon: Benancio Deeds, MD;  Location: [facility];  Service: Gastroenterology;  Laterality: N/A;   COLONOSCOPY WITH PROPOFOL N/A 07/29/2022   Procedure: COLONOSCOPY WITH PROPOFOL;  Surgeon: Napoleon Form, MD;  Location: [facility];  Service: Gastroenterology;  Laterality: N/A;   ESOPHAGOGASTRODUODENOSCOPY (EGD) WITH PROPOFOL N/A 10/30/2018   Procedure: ESOPHAGOGASTRODUODENOSCOPY (EGD) WITH PROPOFOL;  Surgeon: Napoleon Form, MD;  Location: [facility];  Service: Endoscopy;  Laterality: N/A;   ESOPHAGOGASTRODUODENOSCOPY (EGD) WITH PROPOFOL N/A 03/12/2019   Procedure: ESOPHAGOGASTRODUODENOSCOPY (EGD) WITH PROPOFOL;  Surgeon: Napoleon Form, MD;  Location: [facility];  Service: Endoscopy;  Laterality: N/A;   ESOPHAGOGASTRODUODENOSCOPY (EGD) WITH PROPOFOL N/A 07/27/2019   Procedure: ESOPHAGOGASTRODUODENOSCOPY (EGD) WITH PROPOFOL;  Surgeon: Benancio Deeds, MD;  Location: [facility];  Service: Gastroenterology;  Laterality: N/A;   EYE SURGERY  02/2017   cataract extraction with lens implant-left   HEMOSTASIS CLIP PLACEMENT  07/29/2022   Procedure: HEMOSTASIS CLIP PLACEMENT;  Surgeon: Napoleon Form, MD;  Location: [facility];  Service: Gastroenterology;;   HOT HEMOSTASIS N/A 07/27/2019   Procedure: HOT HEMOSTASIS (ARGON PLASMA COAGULATION/BICAP);  Surgeon: Benancio Deeds, MD;  Location: [facility];  Service: Gastroenterology;  Laterality: N/A;   MOUTH SURGERY     POLYPECTOMY  07/27/2019   Procedure: POLYPECTOMY;  Surgeon: Benancio Deeds, MD;   Location: [facility];  Service: Gastroenterology;;   POLYPECTOMY  07/29/2022   Procedure: POLYPECTOMY;  Surgeon: Napoleon Form, MD;  Location: [facility];  Service: Gastroenterology;;   TUBAL LIGATION     Social History   Tobacco Use   Smoking status: Every Day    Current packs/day: 1.00    Average packs/day: 1 pack/day for 58.7 years (58.7 ttl pk-yrs)    Types: Cigarettes    Start date: 09/25/2021    Passive exposure: Past   Smokeless tobacco: Never   Tobacco comments:    Started smoking at 73 years old.     1PPD 12/20/2022 khj  Vaping Use   Vaping status: Never Used  Substance Use Topics   Alcohol use: Not Currently   Drug use: No   Family History  Problem Relation Age of Onset   Heart failure Father    Heart attack Father    Colon cancer Maternal Uncle    Stroke Mother    Esophageal cancer Neg Hx    Rectal cancer Neg Hx    Stomach cancer Neg Hx    Pancreatic cancer Neg Hx    Allergies  Allergen Reactions   Anoro Ellipta [Umeclidinium-Vilanterol] Cough   Bevespi Aerosphere [Glycopyrrolate-Formoterol] Cough    Prolonged coughing episode with first dose   Oxycodone Nausea And Vomiting   Crestor [Rosuvastatin]     Abdominal pain     Glipizide Other (See Comments)    Stomach pain   Current Outpatient Medications on File Prior to Visit  Medication Sig Dispense Refill   acetaminophen (TYLENOL) 500 MG tablet Take 500 mg by mouth every 6 (six) hours as needed for moderate pain.     albuterol (VENTOLIN HFA) 108 (90 Base) MCG/ACT inhaler INHALE 2 PUFFS INTO THE LUNGS EVERY 4 HOURS AS NEEDED FOR WHEEZE OR FOR SHORTNESS OF BREATH 8.5 each 2   cholecalciferol (VITAMIN D3) 25 MCG (1000  UNIT) tablet Take 1,000 Units by mouth daily.     clorazepate (TRANXENE) 3.75 MG tablet Take 1 tablet (3.75 mg total) by mouth 3 (three) times daily as needed for anxiety. 90 tablet 2   cyclobenzaprine (FLEXERIL) 10 MG tablet TAKE 1/2 TABLET BY MOUTH AT BEDTIME AS NEEDED FOR MUSCLE SPASMS  15 tablet 3   desvenlafaxine (PRISTIQ) 50 MG 24 hr tablet TAKE 1 TABLET BY MOUTH EVERY DAY 90 tablet 1   lactulose (CONSTULOSE) 10 GM/15ML solution Take 15 mLs (10 g total) by mouth 2 (two) times daily as needed for severe constipation. 473 mL 1   Multiple Vitamin (MULTIVITAMIN) tablet Take 1 tablet by mouth daily.     Multiple Vitamins-Minerals (PRESERVISION AREDS 2) CAPS Take 1 capsule by mouth 2 (two) times daily.     mupirocin ointment (BACTROBAN) 2 % APPLY 1 APPLICATION TOPICALLY 2 TIMES DAILY AS NEEDED. 15 g 3   nadolol (CORGARD) 20 MG tablet Take 1 tablet (20 mg total) by mouth daily. 90 tablet 3   nystatin (MYCOSTATIN) 100000 UNIT/ML suspension TAKE 5 MLS (500,000 UNITS TOTAL) BY MOUTH 3 (THREE) TIMES DAILY. SWISH AND SWALLOW 120 mL 0   pantoprazole (PROTONIX) 40 MG tablet Take 1 tablet (40 mg total) by mouth daily. (Patient taking differently: Take 80 mg by mouth daily.) 90 tablet 3   rifaximin (XIFAXAN) 550 MG TABS tablet Take 1 tablet (550 mg total) by mouth 2 (two) times daily. 180 tablet 3   SYSTANE COMPLETE 0.6 % SOLN Apply 1 drop to eye 2 (two) times daily.     Tiotropium Bromide Monohydrate (SPIRIVA RESPIMAT) 2.5 MCG/ACT AERS Inhale 2 puffs into the lungs daily. 12 g 0   ursodiol (ACTIGALL) 300 MG capsule TAKE 1 CAPSULE BY MOUTH EVERY DAY 90 capsule 3   No current facility-administered medications on file prior to visit.    Review of Systems  Constitutional:  Positive for fatigue. Negative for activity change, appetite change, fever and unexpected weight change.  HENT:  Negative for congestion, ear pain, rhinorrhea, sinus pressure and sore throat.   Eyes:  Negative for pain, redness and visual disturbance.  Respiratory:  Positive for cough. Negative for shortness of breath and wheezing.   Cardiovascular:  Negative for chest pain and palpitations.  Gastrointestinal:  Positive for constipation. Negative for abdominal pain, blood in stool and diarrhea.  Endocrine: Negative for  polydipsia and polyuria.  Genitourinary:  Negative for dysuria, flank pain, frequency, hematuria and urgency.  Musculoskeletal:  Negative for arthralgias, back pain and myalgias.  Skin:  Negative for pallor and rash.  Allergic/Immunologic: Negative for environmental allergies.  Neurological:  Negative for dizziness, syncope and headaches.  Hematological:  Negative for adenopathy. Does not bruise/bleed easily.  Psychiatric/Behavioral:  Negative for decreased concentration and dysphoric mood. The patient is not nervous/anxious.        Mood is better        Objective:   Physical Exam Constitutional:      General: She is not in acute distress.    Appearance: Normal appearance. She is well-developed. She is not ill-appearing or diaphoretic.     Comments: Slim  Frail appearing   HENT:     Head: Normocephalic and atraumatic.  Eyes:     General: No scleral icterus.    Conjunctiva/sclera: Conjunctivae normal.     Pupils: Pupils are equal, round, and reactive to light.  Neck:     Thyroid: No thyromegaly.     Vascular: No carotid bruit or JVD.  Cardiovascular:     Rate and Rhythm: Normal rate and regular rhythm.     Heart sounds: Normal heart sounds.     No gallop.  Pulmonary:     Effort: Pulmonary effort is normal. No respiratory distress.     Breath sounds: Normal breath sounds. No wheezing or rales.     Comments: Diffusely distant bs  Intermittent harsh cough  Scant wheeze on forced exp Abdominal:     General: There is no distension or abdominal bruit.     Palpations: Abdomen is soft. There is no mass.     Tenderness: There is no abdominal tenderness. There is no guarding or rebound.  Musculoskeletal:     Cervical back: Normal range of motion and neck supple.     Right lower leg: No edema.     Left lower leg: No edema.  Lymphadenopathy:     Cervical: No cervical adenopathy.  Skin:    General: Skin is warm and dry.     Coloration: Skin is not pale.     Findings: No rash.      Comments: Some scarring of right arm / darker skin along edges of former skin tear Overall healed No signs of infection   Neurological:     Mental Status: She is alert.     Coordination: Coordination normal.     Deep Tendon Reflexes: Reflexes are normal and symmetric. Reflexes normal.  Psychiatric:        Mood and Affect: Mood normal.           Assessment & Plan:   Problem List Items Addressed This Visit       Digestive   Cirrhosis of liver (HCC)   ? If adding to microalbuminuria  No clinical changes  Reviewed last Gi note  Lab today incl LFT and protein level   Conitnues nadolol and xifaxan      Relevant Orders   Comprehensive metabolic panel with GFR     Musculoskeletal and Integument   Skin tear of forearm without complication, right, initial encounter   Much improved/healed  Some scarring  Expect the darker scar areas to fade over time         Other   Smoker   No change in status  Not ready to quit       Prediabetes   A1c ordered  Microalb is elevated   disc imp of low glycemic diet and wt loss to prevent DM2  Stressed more protein /less sugar       Relevant Orders   Hemoglobin A1c   Microalbuminuria - Primary   In pt with prediabetes, cirrhosis and malnutrition   Reviewed ratio ot 121.4 (elevated)   Lab today  Consider ARB / blood pressure would likely tolerate       Depression with anxiety   Much improved with psychiatry care and counseling  Taking pristiq 50 mg daily   Reviewed stressors/ coping techniques/symptoms/ support sources/ tx options and side effects in detail today

## 2023-07-22 NOTE — Patient Instructions (Addendum)
 Labs today for kidney and liver function and protein level  Also A1c     Then I may start you on a low dose blood pressure medicine to protect kidneys   So glad your mood is better!    Continue to eat well  Eat regularly  Get protein with every meal   The following are examples of protein in diet  Meat  Fish  Eggs  Dairy products  Soy products  Oat milk  Almond milk Nuts and nut butters  Dried beans

## 2023-07-22 NOTE — Assessment & Plan Note (Signed)
 A1c ordered  Microalb is elevated   disc imp of low glycemic diet and wt loss to prevent DM2  Stressed more protein /less sugar

## 2023-07-22 NOTE — Assessment & Plan Note (Signed)
 Much improved/healed  Some scarring  Expect the darker scar areas to fade over time

## 2023-07-23 ENCOUNTER — Other Ambulatory Visit: Payer: Self-pay | Admitting: Pulmonary Disease

## 2023-07-23 ENCOUNTER — Encounter: Payer: Self-pay | Admitting: Family Medicine

## 2023-07-23 LAB — COMPREHENSIVE METABOLIC PANEL WITH GFR
AG Ratio: 1.4 (calc) (ref 1.0–2.5)
ALT: 5 U/L — ABNORMAL LOW (ref 6–29)
AST: 17 U/L (ref 10–35)
Albumin: 4 g/dL (ref 3.6–5.1)
Alkaline phosphatase (APISO): 86 U/L (ref 37–153)
BUN: 11 mg/dL (ref 7–25)
CO2: 27 mmol/L (ref 20–32)
Calcium: 9.6 mg/dL (ref 8.6–10.4)
Chloride: 97 mmol/L — ABNORMAL LOW (ref 98–110)
Creat: 0.65 mg/dL (ref 0.60–1.00)
Globulin: 2.9 g/dL (ref 1.9–3.7)
Glucose, Bld: 118 mg/dL — ABNORMAL HIGH (ref 65–99)
Potassium: 4.2 mmol/L (ref 3.5–5.3)
Sodium: 132 mmol/L — ABNORMAL LOW (ref 135–146)
Total Bilirubin: 0.4 mg/dL (ref 0.2–1.2)
Total Protein: 6.9 g/dL (ref 6.1–8.1)
eGFR: 93 mL/min/{1.73_m2} (ref >=60)

## 2023-07-23 LAB — HEMOGLOBIN A1C
Hgb A1c MFr Bld: 5.7 %{Hb} — ABNORMAL HIGH (ref <5.7)
Mean Plasma Glucose: 117 mg/dL
eAG (mmol/L): 6.5 mmol/L

## 2023-07-23 MED ORDER — LOSARTAN POTASSIUM 25 MG PO TABS: 25.0000 mg | ORAL_TABLET | Freq: Every day | ORAL | 0 refills | Status: DC

## 2023-07-23 NOTE — Addendum Note (Signed)
 Addended by: Deri Fleet A on: 07/23/2023 02:14 PM   Modules accepted: Orders

## 2023-07-24 ENCOUNTER — Other Ambulatory Visit: Payer: Self-pay | Admitting: Family Medicine

## 2023-08-02 ENCOUNTER — Other Ambulatory Visit: Payer: Self-pay | Admitting: Family Medicine

## 2023-08-03 ENCOUNTER — Other Ambulatory Visit (HOSPITAL_COMMUNITY): Payer: Self-pay

## 2023-08-03 ENCOUNTER — Other Ambulatory Visit: Payer: Self-pay | Admitting: Adult Health

## 2023-08-03 ENCOUNTER — Telehealth: Payer: Self-pay

## 2023-08-03 DIAGNOSIS — F411 Generalized anxiety disorder: Secondary | ICD-10-CM

## 2023-08-03 NOTE — Telephone Encounter (Signed)
 Called and informed patient that her Xifaxian was approved until 01/2024. Patient was very grateful for getting the approval and the call letting her know

## 2023-08-03 NOTE — Telephone Encounter (Signed)
 Pharmacy Patient Advocate Encounter  Received notification from [employer] Medicare that Prior Authorization for Xifaxan  550MG  tablets has been APPROVED from 08-03-2023 to 01-30-2024   PA #/Case ID/Reference #: [facility]

## 2023-08-03 NOTE — Telephone Encounter (Signed)
 Pharmacy Patient Advocate Encounter   Received notification from CoverMyMeds that prior authorization for Xifaxan  550MG  tablets is required/requested.   Insurance verification completed.   The patient is insured through [employer] [facility] Medicare .   Per test claim: PA required; PA submitted to above mentioned insurance via CoverMyMeds Key/confirmation #/[id number] Status is pending

## 2023-08-09 ENCOUNTER — Encounter: Payer: Self-pay | Admitting: Family Medicine

## 2023-08-09 ENCOUNTER — Ambulatory Visit (INDEPENDENT_AMBULATORY_CARE_PROVIDER_SITE_OTHER): Admitting: Family Medicine

## 2023-08-09 VITALS — BP 120/60 | HR 84 | Temp 97.7°F | Ht 64.0 in | Wt 113.4 lb

## 2023-08-09 DIAGNOSIS — K7031 Alcoholic cirrhosis of liver with ascites: Secondary | ICD-10-CM

## 2023-08-09 DIAGNOSIS — F17200 Nicotine dependence, unspecified, uncomplicated: Secondary | ICD-10-CM | POA: Diagnosis not present

## 2023-08-09 DIAGNOSIS — R809 Proteinuria, unspecified: Secondary | ICD-10-CM

## 2023-08-09 DIAGNOSIS — R7303 Prediabetes: Secondary | ICD-10-CM | POA: Diagnosis not present

## 2023-08-09 DIAGNOSIS — E871 Hypo-osmolality and hyponatremia: Secondary | ICD-10-CM | POA: Diagnosis not present

## 2023-08-09 NOTE — Assessment & Plan Note (Signed)
 Continues lactulose  which may add to hyponatremia   Lab Results  Component Value Date   ALT 5 (L) 07/22/2023   AST 17 07/22/2023   ALKPHOS 72 04/27/2023   BILITOT 0.4 07/22/2023   Continues GI follow up and treatment  Still not drinking etoh

## 2023-08-09 NOTE — Assessment & Plan Note (Signed)
 Na of 132 Stable Former alcohol drinker Taking lactulose  for cirrhosis   Will continue to follow  Bmet today

## 2023-08-09 NOTE — Assessment & Plan Note (Signed)
 Lab Results  Component Value Date   HGBA1C 5.7 (H) 07/22/2023   HGBA1C 5.8 12/16/2022   HGBA1C 6.6 (H) 12/21/2021   disc imp of low glycemic diet and wt loss to prevent DM2  More protein calories as tolerated to prevent weight loss

## 2023-08-09 NOTE — Progress Notes (Signed)
 Subjective:    Patient ID: Christine Barton, female    DOB: July 25, 1950, 73 y.o.   MRN: [id number]  HPI  Wt Readings from Last 3 Encounters:  08/09/23 113 lb 6.4 oz (51.4 kg)  07/22/23 116 lb (52.6 kg)  05/11/23 116 lb 6.4 oz (52.8 kg)   19.47 kg/m  Vitals:   08/09/23 1414  BP: 120/60  Pulse: 84  Temp: 97.7 F (36.5 C)  SpO2: 99%   Pt presents for follow up of chronic health problems Microalbuminuria  Prediabetes    Prediabetes Lab Results  Component Value Date   HGBA1C 5.7 (H) 07/22/2023   HGBA1C 5.8 12/16/2022   HGBA1C 6.6 (H) 12/21/2021  Appetite is great    Lab Results  Component Value Date   MICROALBUR 9.6 (H) 07/19/2023   MICROALBUR 1.2 12/16/2022  Ratio of 121.4 Liver may have something to do with this   Lab Results  Component Value Date   NA 132 (L) 07/22/2023   K 4.2 07/22/2023   CO2 27 07/22/2023   GLUCOSE 118 (H) 07/22/2023   BUN 11 07/22/2023   CREATININE 0.65 07/22/2023   CALCIUM  9.6 07/22/2023   GFR 72.73 12/16/2022   EGFR 93 07/22/2023   GFRNONAA >60 04/27/2023   Takes lactulose  and miralax     Started low dose arb for microalbuminuria  Losartan  25 mg daily  No side effects at all    BP Readings from Last 3 Encounters:  08/09/23 120/60  07/22/23 136/64  05/11/23 130/60   Pulse Readings from Last 3 Encounters:  08/09/23 84  07/22/23 71  05/11/23 67      Cirrhosis  Lab Results  Component Value Date   ALT 5 (L) 07/22/2023   AST 17 07/22/2023   ALKPHOS 72 04/27/2023   BILITOT 0.4 07/22/2023   Lab Results  Component Value Date   WBC 6.5 04/27/2023   HGB 12.8 04/27/2023   HCT 38.2 04/27/2023   MCV 90.3 04/27/2023   PLT 172 04/27/2023       Patient Active Problem List   Diagnosis Date Noted   Depression with anxiety 07/22/2023   Microalbuminuria 07/22/2023   Skin tear of forearm without complication, right, initial encounter 01/31/2023   Laceration of left index finger 01/31/2023   Food insecurity  01/25/2023   Current use of proton pump inhibitor 12/14/2022   Osteopenia 06/17/2022   Pulmonary emphysema (HCC) 09/03/2021   Agatston CAC score, >400 07/02/2021   Left-sided chest wall pain 06/04/2021   Lung cancer (HCC) 02/25/2021   Iron  deficiency anemia 01/01/2021   Iron  deficiency anemia due to chronic blood loss 11/06/2020   AVM (arteriovenous malformation) of colon    Benign neoplasm of colon    NASH (nonalcoholic steatohepatitis)    Malnutrition (HCC) 07/25/2019   HSV infection 02/23/2019   Aortic atherosclerosis (HCC) 01/22/2019   Coronary atherosclerosis 01/22/2019   Encounter for screening for lung cancer 01/04/2019   Venous insufficiency 11/15/2018   Hyponatremia 11/07/2018   Esophageal varices without bleeding (HCC) 10/27/2018   Anemia 10/23/2018   Marasmus (HCC) 07/26/2018   Poor balance 05/30/2018   Cirrhosis of liver (HCC) 04/24/2018   Gallstones 04/24/2018   Heme positive stool 04/24/2018   Smoker 11/07/2017   Welcome to Medicare preventive visit 10/26/2016   Estrogen deficiency 10/26/2016   Colon cancer screening 11/01/2014   Encounter for routine gynecological examination 09/12/2013   Left shoulder pain 08/20/2011   Routine general medical examination at a health care facility 07/21/2011  Vitamin D  deficiency 08/04/2009   POSTMENOPAUSAL STATUS 08/04/2009   Prediabetes 02/18/2009   Hyperlipidemia associated with type 2 diabetes mellitus (HCC) 06/14/2008   DISC DISEASE, CERVICAL 03/30/2007   History of alcohol abuse 12/06/2006   Neuropathy of both feet 12/06/2006   DIVERTICULOSIS, COLON 12/06/2006   Fatty liver 12/06/2006   BREAST CANCER, HX OF 12/06/2006   Past Medical History:  Diagnosis Date   Alcohol abuse, unspecified    Breast cancer (HCC) 1998   Right   Cataract    left eye   Cervical spondylosis 2006   MRI   Degeneration of cervical intervertebral disc 2006   MRI   Diabetes mellitus without complication (HCC)    Diverticulosis of colon  (without mention of hemorrhage)    Hyperpotassemia    Lung cancer (HCC) 03/2021   started radiation in January 2023, right side   Microscopic hematuria    Mononeuritis of unspecified site    Nonspecific abnormal results of liver function study    Other abnormal glucose    Other and unspecified hyperlipidemia    no per pt   Other chronic nonalcoholic liver disease    Personal history of chemotherapy    Personal history of malignant neoplasm of breast    Personal history of radiation therapy    Pneumothorax, acute    right, spontaneous   Tobacco use disorder    Unspecified vitamin D  deficiency    Past Surgical History:  Procedure Laterality Date   BIOPSY  07/27/2019   Procedure: BIOPSY;  Surgeon: Ace Holder, MD;  Location: [facility];  Service: Gastroenterology;;   BREAST BIOPSY  9/03   Right   BREAST LUMPECTOMY Right 1998   CHEST TUBE INSERTION  11/02/2014   COLONOSCOPY     COLONOSCOPY WITH PROPOFOL  N/A 07/27/2019   Procedure: COLONOSCOPY WITH PROPOFOL ;  Surgeon: Ace Holder, MD;  Location: [facility];  Service: Gastroenterology;  Laterality: N/A;   COLONOSCOPY WITH PROPOFOL  N/A 07/29/2022   Procedure: COLONOSCOPY WITH PROPOFOL ;  Surgeon: Sergio Dandy, MD;  Location: [facility];  Service: Gastroenterology;  Laterality: N/A;   ESOPHAGOGASTRODUODENOSCOPY (EGD) WITH PROPOFOL  N/A 10/30/2018   Procedure: ESOPHAGOGASTRODUODENOSCOPY (EGD) WITH PROPOFOL ;  Surgeon: Sergio Dandy, MD;  Location: [facility];  Service: Endoscopy;  Laterality: N/A;   ESOPHAGOGASTRODUODENOSCOPY (EGD) WITH PROPOFOL  N/A 03/12/2019   Procedure: ESOPHAGOGASTRODUODENOSCOPY (EGD) WITH PROPOFOL ;  Surgeon: Sergio Dandy, MD;  Location: [facility];  Service: Endoscopy;  Laterality: N/A;   ESOPHAGOGASTRODUODENOSCOPY (EGD) WITH PROPOFOL  N/A 07/27/2019   Procedure: ESOPHAGOGASTRODUODENOSCOPY (EGD) WITH PROPOFOL ;  Surgeon: Ace Holder, MD;  Location: [facility];  Service:  Gastroenterology;  Laterality: N/A;   EYE SURGERY  02/2017   cataract extraction with lens implant-left   HEMOSTASIS CLIP PLACEMENT  07/29/2022   Procedure: HEMOSTASIS CLIP PLACEMENT;  Surgeon: Sergio Dandy, MD;  Location: [facility];  Service: Gastroenterology;;   HOT HEMOSTASIS N/A 07/27/2019   Procedure: HOT HEMOSTASIS (ARGON PLASMA COAGULATION/BICAP);  Surgeon: Ace Holder, MD;  Location: [facility];  Service: Gastroenterology;  Laterality: N/A;   MOUTH SURGERY     POLYPECTOMY  07/27/2019   Procedure: POLYPECTOMY;  Surgeon: Ace Holder, MD;  Location: [facility];  Service: Gastroenterology;;   POLYPECTOMY  07/29/2022   Procedure: POLYPECTOMY;  Surgeon: Sergio Dandy, MD;  Location: [facility];  Service: Gastroenterology;;   TUBAL LIGATION     Social History   Tobacco Use   Smoking status: Every Day    Current packs/day: 1.00  Average packs/day: 1 pack/day for 58.8 years (58.8 ttl pk-yrs)    Types: Cigarettes    Start date: 09/25/2021    Passive exposure: Past   Smokeless tobacco: Never   Tobacco comments:    Started smoking at 73 years old.     1PPD 12/20/2022 khj  Vaping Use   Vaping status: Never Used  Substance Use Topics   Alcohol use: Not Currently   Drug use: No   Family History  Problem Relation Age of Onset   Heart failure Father    Heart attack Father    Colon cancer Maternal Uncle    Stroke Mother    Esophageal cancer Neg Hx    Rectal cancer Neg Hx    Stomach cancer Neg Hx    Pancreatic cancer Neg Hx    Allergies  Allergen Reactions   Anoro Ellipta  [Umeclidinium-Vilanterol] Cough   Bevespi  Aerosphere [Glycopyrrolate-Formoterol ] Cough    Prolonged coughing episode with first dose   Oxycodone  Nausea And Vomiting   Crestor  [Rosuvastatin ]     Abdominal pain     Glipizide  Other (See Comments)    Stomach pain   Current Outpatient Medications on File Prior to Visit  Medication Sig Dispense Refill   acetaminophen   (TYLENOL ) 500 MG tablet Take 500 mg by mouth every 6 (six) hours as needed for moderate pain.     albuterol  (VENTOLIN  HFA) 108 (90 Base) MCG/ACT inhaler INHALE 2 PUFFS INTO THE LUNGS EVERY 4 HOURS AS NEEDED FOR WHEEZE OR FOR SHORTNESS OF BREATH 8.5 each 2   cholecalciferol (VITAMIN D3) 25 MCG (1000 UNIT) tablet Take 1,000 Units by mouth daily.     clorazepate  (TRANXENE ) 3.75 MG tablet Take 1 tablet (3.75 mg total) by mouth 3 (three) times daily as needed for anxiety. 90 tablet 2   cyclobenzaprine  (FLEXERIL ) 10 MG tablet TAKE 1/2 TABLET BY MOUTH AT BEDTIME AS NEEDED FOR MUSCLE SPASMS 15 tablet 3   desvenlafaxine  (PRISTIQ ) 50 MG 24 hr tablet TAKE 1 TABLET BY MOUTH EVERY DAY 90 tablet 0   lactulose  (CONSTULOSE ) 10 GM/15ML solution Take 15 mLs (10 g total) by mouth 2 (two) times daily as needed for severe constipation. 473 mL 1   losartan  (COZAAR ) 25 MG tablet Take 1 tablet (25 mg total) by mouth daily. 90 tablet 0   Multiple Vitamin (MULTIVITAMIN) tablet Take 1 tablet by mouth daily.     Multiple Vitamins-Minerals (PRESERVISION AREDS 2) CAPS Take 1 capsule by mouth 2 (two) times daily.     mupirocin  ointment (BACTROBAN ) 2 % APPLY 1 APPLICATION TOPICALLY 2 TIMES DAILY AS NEEDED. 15 g 3   nadolol  (CORGARD ) 20 MG tablet Take 1 tablet (20 mg total) by mouth daily. 90 tablet 3   nystatin  (MYCOSTATIN ) 100000 UNIT/ML suspension TAKE 5 MLS (500,000 UNITS TOTAL) BY MOUTH 3 (THREE) TIMES DAILY. SWISH AND SWALLOW 120 mL 0   pantoprazole  (PROTONIX ) 40 MG tablet Take 1 tablet (40 mg total) by mouth daily. (Patient taking differently: Take 80 mg by mouth daily.) 90 tablet 3   rifaximin  (XIFAXAN ) 550 MG TABS tablet Take 1 tablet (550 mg total) by mouth 2 (two) times daily. 180 tablet 3   SYSTANE COMPLETE 0.6 % SOLN Apply 1 drop to eye 2 (two) times daily.     Tiotropium Bromide Monohydrate  (SPIRIVA  RESPIMAT) 2.5 MCG/ACT AERS INHALE 2 PUFFS BY MOUTH INTO THE LUNGS DAILY 4 g 11   ursodiol  (ACTIGALL ) 300 MG capsule TAKE  1 CAPSULE BY MOUTH EVERY DAY 90 capsule 3  No current facility-administered medications on file prior to visit.    Review of Systems  Constitutional:  Positive for fatigue. Negative for activity change, appetite change, fever and unexpected weight change.  HENT:  Negative for congestion, rhinorrhea, sore throat and trouble swallowing.   Eyes:  Negative for pain, redness, itching and visual disturbance.  Respiratory:  Negative for cough, chest tightness, shortness of breath and wheezing.   Cardiovascular:  Negative for chest pain and palpitations.  Gastrointestinal:  Negative for abdominal pain, blood in stool, constipation, diarrhea and nausea.  Endocrine: Negative for cold intolerance, heat intolerance, polydipsia and polyuria.  Genitourinary:  Negative for difficulty urinating, dysuria, frequency and urgency.  Musculoskeletal:  Negative for arthralgias, joint swelling and myalgias.  Skin:  Negative for pallor and rash.  Neurological:  Negative for dizziness, tremors, weakness, numbness and headaches.  Hematological:  Negative for adenopathy. Does not bruise/bleed easily.  Psychiatric/Behavioral:  Negative for decreased concentration and dysphoric mood. The patient is not nervous/anxious.        Objective:   Physical Exam Constitutional:      General: She is not in acute distress.    Appearance: Normal appearance. She is well-developed. She is not ill-appearing or diaphoretic.     Comments: Under weight   HENT:     Head: Normocephalic and atraumatic.  Eyes:     Conjunctiva/sclera: Conjunctivae normal.     Pupils: Pupils are equal, round, and reactive to light.  Neck:     Thyroid : No thyromegaly.     Vascular: No carotid bruit or JVD.  Cardiovascular:     Rate and Rhythm: Normal rate and regular rhythm.     Heart sounds: Normal heart sounds.     No gallop.  Pulmonary:     Effort: Pulmonary effort is normal. No respiratory distress.     Breath sounds: Wheezing present. No  rales.     Comments: Scant scattered wheeze  Diffusely distant bs  Abdominal:     General: There is no distension or abdominal bruit.     Palpations: Abdomen is soft.  Musculoskeletal:     Cervical back: Normal range of motion and neck supple.     Right lower leg: No edema.     Left lower leg: No edema.  Lymphadenopathy:     Cervical: No cervical adenopathy.  Skin:    General: Skin is warm and dry.     Coloration: Skin is not pale.     Findings: No rash.  Neurological:     Mental Status: She is alert.     Coordination: Coordination normal.     Deep Tendon Reflexes: Reflexes are normal and symmetric.  Psychiatric:        Mood and Affect: Mood normal.           Assessment & Plan:   Problem List Items Addressed This Visit       Digestive   Cirrhosis of liver (HCC)   Continues lactulose  which may add to hyponatremia   Lab Results  Component Value Date   ALT 5 (L) 07/22/2023   AST 17 07/22/2023   ALKPHOS 72 04/27/2023   BILITOT 0.4 07/22/2023   Continues GI follow up and treatment  Still not drinking etoh         Other   Smoker   Strongly encouraged to quit Disc in detail risks of smoking and possible outcomes including copd, vascular/ heart disease, cancer , respiratory and sinus infections as well as osteoporosis  Pt voices  understanding Already has lung cancer as well  She is not ready to quit       Prediabetes   Lab Results  Component Value Date   HGBA1C 5.7 (H) 07/22/2023   HGBA1C 5.8 12/16/2022   HGBA1C 6.6 (H) 12/21/2021   disc imp of low glycemic diet and wt loss to prevent DM2  More protein calories as tolerated to prevent weight loss       Microalbuminuria - Primary   Taking losartan  25 mg daily now for renal protection  Blood pressure is good Tolerates well Bmet today   Will continue to monitor       Relevant Orders   Basic metabolic panel with GFR   Hyponatremia   Na of 132 Stable Former alcohol drinker Taking lactulose  for  cirrhosis   Will continue to follow  Bmet today

## 2023-08-09 NOTE — Assessment & Plan Note (Signed)
 Strongly encouraged to quit Disc in detail risks of smoking and possible outcomes including copd, vascular/ heart disease, cancer , respiratory and sinus infections as well as osteoporosis  Pt voices understanding Already has lung cancer as well  She is not ready to quit

## 2023-08-09 NOTE — Assessment & Plan Note (Signed)
 Taking losartan  25 mg daily now for renal protection  Blood pressure is good Tolerates well Bmet today   Will continue to monitor

## 2023-08-09 NOTE — Patient Instructions (Addendum)
 Glad your appetite is good  Make sure to get fruits and veggies   I want to keep you on losartan  25 mg daily for kidney health   Labs today   Sodium level is stable  The lactulose  and the miralax may play a role in this   Take care of yourself  Keep eating regularly   Keep thinking about quitting smoking

## 2023-08-10 ENCOUNTER — Encounter: Payer: Self-pay | Admitting: Family Medicine

## 2023-08-10 LAB — BASIC METABOLIC PANEL WITH GFR
BUN: 12 mg/dL (ref 6–23)
CO2: 29 meq/L (ref 19–32)
Calcium: 9.5 mg/dL (ref 8.4–10.5)
Chloride: 96 meq/L (ref 96–112)
Creatinine, Ser: 0.76 mg/dL (ref 0.40–1.20)
GFR: 78.15 mL/min (ref >60.00)
Glucose, Bld: 148 mg/dL — ABNORMAL HIGH (ref 70–99)
Potassium: 4.5 meq/L (ref 3.5–5.1)
Sodium: 133 meq/L — ABNORMAL LOW (ref 135–145)

## 2023-08-12 ENCOUNTER — Telehealth: Payer: Self-pay | Admitting: Gastroenterology

## 2023-08-12 NOTE — Telephone Encounter (Signed)
 Inbound call from patient requesting to speak with nurse Jerlene Moody, she is requesting a call to discuss what stage liver diease she has. Please advise.

## 2023-08-15 NOTE — Telephone Encounter (Signed)
 Chronic liver disease with well-managed liver function

## 2023-09-10 ENCOUNTER — Other Ambulatory Visit: Payer: Self-pay | Admitting: Family Medicine

## 2023-09-26 ENCOUNTER — Encounter: Payer: Self-pay | Admitting: Adult Health

## 2023-09-26 ENCOUNTER — Ambulatory Visit: Payer: Medicare HMO | Admitting: Adult Health

## 2023-09-26 DIAGNOSIS — F411 Generalized anxiety disorder: Secondary | ICD-10-CM

## 2023-09-26 DIAGNOSIS — G4701 Insomnia due to medical condition: Secondary | ICD-10-CM

## 2023-09-26 MED ORDER — CLORAZEPATE DIPOTASSIUM 3.75 MG PO TABS
3.7500 mg | ORAL_TABLET | Freq: Three times a day (TID) | ORAL | 2 refills | Status: DC | PRN
Start: 2023-09-26 — End: 2024-02-27

## 2023-09-26 MED ORDER — DESVENLAFAXINE SUCCINATE ER 100 MG PO TB24: 100.0000 mg | ORAL_TABLET | Freq: Every day | ORAL | 5 refills | Status: DC

## 2023-09-26 NOTE — Progress Notes (Signed)
 Christine Barton [id number] [birth year] 73 y.o.  Virtual Visit via Video Note  I connected with pt @ on 09/26/23 at  2:30 PM EDT by a video enabled telemedicine application and verified that I am speaking with the correct person using two identifiers.   I discussed the limitations of evaluation and management by telemedicine and the availability of in person appointments. The patient expressed understanding and agreed to proceed.  I discussed the assessment and treatment plan with the patient. The patient was provided an opportunity to ask questions and all were answered. The patient agreed with the plan and demonstrated an understanding of the instructions.   The patient was advised to call back or seek an in-person evaluation if the symptoms worsen or if the condition fails to improve as anticipated.  I provided 20 minutes of non-face-to-face time during this encounter.  The patient was located at home.  The provider was located at [facility].   Reagan Camera, NP   Subjective:   Patient ID:  Christine Barton is a 73 y.o. (DOB [birth year]) female.  Chief Complaint: No chief complaint on file.   HPI Christine Barton presents for follow-up of GAD and insomnia.   Describes mood today as ok. Pleasant. Reports tearfulness. Mood symptoms - reports decreased depression, anxiety and irritability with addition of Pristiq . Reports improved interest and motivation. Reports panic attacks occasionally. Reports decreased worry, rumination and over thinking that is better. Reports mood has improved. Stating I feel like I'm doing better. Feels like current medications are helpful - would like to increase the dose of Pristiq  from 50mg  to 100mg . Taking medications as prescribed. Energy levels improved. Active, does not have a regular exercise routine.  Enjoys some usual interests and activities. Lives alone with 6 cats. Lives next to daughter. Spending time with family. Appetite  decreased. Weight stable - 113 pounds. Sleeps well most nights. Averages 7 to 8 hours. Focus and concentration stable. Completing tasks. Managing aspects of household. Retired from [employer]. Denies SI or HI.  Denies AH or VH. Denies self harm. Denies substance use. Has seen a therapist previously.  Review of Systems:  Review of Systems  Musculoskeletal:  Negative for gait problem.  Neurological:  Negative for tremors.  Psychiatric/Behavioral:         Please refer to HPI    Medications: I have reviewed the patient's current medications.  Current Outpatient Medications  Medication Sig Dispense Refill   acetaminophen  (TYLENOL ) 500 MG tablet Take 500 mg by mouth every 6 (six) hours as needed for moderate pain.     albuterol  (VENTOLIN  HFA) 108 (90 Base) MCG/ACT inhaler INHALE 2 PUFFS INTO THE LUNGS EVERY 4 HOURS AS NEEDED FOR WHEEZE OR FOR SHORTNESS OF BREATH 8.5 each 2   cholecalciferol (VITAMIN D3) 25 MCG (1000 UNIT) tablet Take 1,000 Units by mouth daily.     clorazepate  (TRANXENE ) 3.75 MG tablet Take 1 tablet (3.75 mg total) by mouth 3 (three) times daily as needed for anxiety. 90 tablet 2   cyclobenzaprine  (FLEXERIL ) 10 MG tablet TAKE 1/2 TABLET BY MOUTH AT BEDTIME AS NEEDED FOR MUSCLE SPASMS 15 tablet 3   desvenlafaxine  (PRISTIQ ) 100 MG 24 hr tablet Take 1 tablet (100 mg total) by mouth daily. 30 tablet 5   lactulose  (CONSTULOSE ) 10 GM/15ML solution Take 15 mLs (10 g total) by mouth 2 (two) times daily as needed for severe constipation. 473 mL 1   losartan  (COZAAR ) 25 MG tablet TAKE 1 TABLET (25 MG  TOTAL) BY MOUTH DAILY. 90 tablet 1   Multiple Vitamin (MULTIVITAMIN) tablet Take 1 tablet by mouth daily.     Multiple Vitamins-Minerals (PRESERVISION AREDS 2) CAPS Take 1 capsule by mouth 2 (two) times daily.     mupirocin  ointment (BACTROBAN ) 2 % APPLY 1 APPLICATION TOPICALLY 2 TIMES DAILY AS NEEDED. 15 g 3   nadolol  (CORGARD ) 20 MG tablet Take 1 tablet (20 mg total) by mouth  daily. 90 tablet 3   nystatin  (MYCOSTATIN ) 100000 UNIT/ML suspension TAKE 5 MLS (500,000 UNITS TOTAL) BY MOUTH 3 (THREE) TIMES DAILY. SWISH AND SWALLOW 120 mL 0   pantoprazole  (PROTONIX ) 40 MG tablet Take 1 tablet (40 mg total) by mouth daily. (Patient taking differently: Take 80 mg by mouth daily.) 90 tablet 3   rifaximin  (XIFAXAN ) 550 MG TABS tablet Take 1 tablet (550 mg total) by mouth 2 (two) times daily. 180 tablet 3   SYSTANE COMPLETE 0.6 % SOLN Apply 1 drop to eye 2 (two) times daily.     Tiotropium Bromide Monohydrate  (SPIRIVA  RESPIMAT) 2.5 MCG/ACT AERS INHALE 2 PUFFS BY MOUTH INTO THE LUNGS DAILY 4 g 11   ursodiol  (ACTIGALL ) 300 MG capsule TAKE 1 CAPSULE BY MOUTH EVERY DAY 90 capsule 3   No current facility-administered medications for this visit.    Medication Side Effects: None  Allergies:  Allergies  Allergen Reactions   Anoro Ellipta  [Umeclidinium-Vilanterol] Cough   Bevespi  Aerosphere [Glycopyrrolate-Formoterol ] Cough    Prolonged coughing episode with first dose   Oxycodone  Nausea And Vomiting   Crestor  [Rosuvastatin ]     Abdominal pain     Glipizide  Other (See Comments)    Stomach pain    Past Medical History:  Diagnosis Date   Alcohol abuse, unspecified    Breast cancer (HCC) 1998   Right   Cataract    left eye   Cervical spondylosis 2006   MRI   Degeneration of cervical intervertebral disc 2006   MRI   Diabetes mellitus without complication (HCC)    Diverticulosis of colon (without mention of hemorrhage)    Hyperpotassemia    Lung cancer (HCC) 03/2021   started radiation in January 2023, right side   Microscopic hematuria    Mononeuritis of unspecified site    Nonspecific abnormal results of liver function study    Other abnormal glucose    Other and unspecified hyperlipidemia    no per pt   Other chronic nonalcoholic liver disease    Personal history of chemotherapy    Personal history of malignant neoplasm of breast    Personal history of  radiation therapy    Pneumothorax, acute    right, spontaneous   Tobacco use disorder    Unspecified vitamin D  deficiency     Family History  Problem Relation Age of Onset   Heart failure Father    Heart attack Father    Colon cancer Maternal Uncle    Stroke Mother    Esophageal cancer Neg Hx    Rectal cancer Neg Hx    Stomach cancer Neg Hx    Pancreatic cancer Neg Hx     Social History   Socioeconomic History   Marital status: Single    Spouse name: Not on file   Number of children: 1   Years of education: Not on file   Highest education level: Not on file  Occupational History   Occupation: retired    Associate Professor: [employer]  Tobacco Use   Smoking status: Every Day  Current packs/day: 1.00    Average packs/day: 1 pack/day for 58.9 years (58.9 ttl pk-yrs)    Types: Cigarettes    Start date: 09/25/2021    Passive exposure: Past   Smokeless tobacco: Never   Tobacco comments:    Started smoking at 73 years old.     1PPD 12/20/2022 khj  Vaping Use   Vaping status: Never Used  Substance and Sexual Activity   Alcohol use: Not Currently   Drug use: No   Sexual activity: Not Currently  Other Topics Concern   Not on file  Social History Narrative   Divorced      1 child      Works at [employer]         Social Drivers of [employer]: Low Risk  (04/01/2023)   Overall Financial Resource Strain (CARDIA)    Difficulty of Paying Living Expenses: Not hard at all  Food Insecurity: No Food Insecurity (04/01/2023)   Hunger Vital Sign    Worried About Running Out of Food in the Last Year: Never true    Ran Out of Food in the Last Year: Never true  Recent Concern: Food Insecurity - Food Insecurity Present (03/03/2023)   Hunger Vital Sign    Worried About Running Out of Food in the Last Year: Never true    Ran Out of Food in the Last Year: Sometimes true  Transportation Needs: No Transportation Needs (04/01/2023)   PRAPARE -  Administrator, Civil Service (Medical): No    Lack of Transportation (Non-Medical): No  Physical Activity: Inactive (04/01/2023)   Exercise Vital Sign    Days of Exercise per Week: 0 days    Minutes of Exercise per Session: 0 min  Stress: No Stress Concern Present (04/01/2023)   [employer] of Occupational Health - Occupational Stress Questionnaire    Feeling of Stress : Not at all  Social Connections: Unknown (04/01/2023)   Social Connection and Isolation Panel    Frequency of Communication with Friends and Family: Never    Frequency of Social Gatherings with Friends and Family: Never    Attends Religious Services: Never    Database administrator or Organizations: No    Attends Banker Meetings: Never    Marital Status: Not on file  Intimate Partner Violence: Not At Risk (04/01/2023)   Humiliation, Afraid, Rape, and Kick questionnaire    Fear of Current or Ex-Partner: No    Emotionally Abused: No    Physically Abused: No    Sexually Abused: No    Past Medical History, Surgical history, Social history, and Family history were reviewed and updated as appropriate.   Please see review of systems for further details on the patient's review from today.   Objective:   Physical Exam:  LMP  (LMP Unknown)   Physical Exam Constitutional:      General: She is not in acute distress.  Musculoskeletal:        General: No deformity.   Neurological:     Mental Status: She is alert and oriented to person, place, and time.     Coordination: Coordination normal.   Psychiatric:        Attention and Perception: Attention and perception normal. She does not perceive auditory or visual hallucinations.        Mood and Affect: Mood normal. Mood is not anxious or depressed. Affect is not labile, blunt, angry or inappropriate.  Speech: Speech normal.        Behavior: Behavior normal.        Thought Content: Thought content normal. Thought content is not  paranoid or delusional. Thought content does not include homicidal or suicidal ideation. Thought content does not include homicidal or suicidal plan.        Cognition and Memory: Cognition and memory normal.        Judgment: Judgment normal.     Comments: Insight intact     Lab Review:     Component Value Date/Time   NA 133 (L) 08/09/2023 1444   NA 132 (L) 05/26/2021 1147   K 4.5 08/09/2023 1444   CL 96 08/09/2023 1444   CO2 29 08/09/2023 1444   GLUCOSE 148 (H) 08/09/2023 1444   BUN 12 08/09/2023 1444   BUN 21 05/26/2021 1147   CREATININE 0.76 08/09/2023 1444   CREATININE 0.65 07/22/2023 1508   CALCIUM  9.5 08/09/2023 1444   PROT 6.9 07/22/2023 1508   PROT 7.1 05/26/2021 1147   ALBUMIN  4.2 04/27/2023 1452   ALBUMIN  4.4 05/26/2021 1147   AST 17 07/22/2023 1508   AST 17 04/27/2023 1452   ALT 5 (L) 07/22/2023 1508   ALT 6 04/27/2023 1452   ALKPHOS 72 04/27/2023 1452   BILITOT 0.4 07/22/2023 1508   BILITOT 0.5 04/27/2023 1452   GFRNONAA >60 04/27/2023 1452   GFRAA 83 12/11/2019 1617       Component Value Date/Time   WBC 6.5 04/27/2023 1452   WBC 6.4 12/16/2022 0939   RBC 4.23 04/27/2023 1452   HGB 12.8 04/27/2023 1452   HGB 12.4 12/11/2019 1617   HCT 38.2 04/27/2023 1452   HCT 39.3 12/11/2019 1617   PLT 172 04/27/2023 1452   PLT 120 (L) 12/11/2019 1617   MCV 90.3 04/27/2023 1452   MCV 86 12/11/2019 1617   MCH 30.3 04/27/2023 1452   MCHC 33.5 04/27/2023 1452   RDW 15.1 04/27/2023 1452   RDW 17.0 (H) 12/11/2019 1617   LYMPHSABS 1.5 04/27/2023 1452   MONOABS 0.4 04/27/2023 1452   EOSABS 0.1 04/27/2023 1452   BASOSABS 0.0 04/27/2023 1452    No results found for: POCLITH, LITHIUM   No results found for: PHENYTOIN, PHENOBARB, VALPROATE, CBMZ   .res Assessment: Plan:    Plan:  PDMP reviewed  1. Tranxene  3.75 TID prn anxiety 2. Pristiq  50mg  to 100mg  daily  RTC 3 months  20 minutes spent dedicated to the care of this patient on the date of this  encounter to include pre-visit review of records, ordering of medication, post visit documentation, and face-to-face time with the patient discussing GAD and insomnia. Discussed continuing current medication regimen.  Patient advised to contact office with any questions, adverse effects, or acute worsening in signs and symptoms.  Discussed potential benefits, risk, and side effects of benzodiazepines to include potential risk of tolerance and dependence, as well as possible drowsiness. Advised patient not to drive if experiencing drowsiness and to take lowest possible effective dose to minimize risk of dependence and tolerance.   Diagnoses and all orders for this visit:  Generalized anxiety disorder -     clorazepate  (TRANXENE ) 3.75 MG tablet; Take 1 tablet (3.75 mg total) by mouth 3 (three) times daily as needed for anxiety. -     desvenlafaxine  (PRISTIQ ) 100 MG 24 hr tablet; Take 1 tablet (100 mg total) by mouth daily.  Insomnia due to medical condition -     clorazepate  (TRANXENE ) 3.75 MG tablet;  Take 1 tablet (3.75 mg total) by mouth 3 (three) times daily as needed for anxiety.     Please see After Visit Summary for patient specific instructions.  Future Appointments  Date Time Provider Department Center  10/17/2023 10:00 AM [facility] None  10/24/2023 11:30 AM Marlene Simas, MD [facility] None  10/24/2023  3:00 PM Sergio Dandy, MD [facility]  04/03/2024  2:20 PM [facility] ANNUAL WELLNESS VISIT 1 [facility]    No orders of the defined types were placed in this encounter.     -------------------------------

## 2023-09-27 ENCOUNTER — Other Ambulatory Visit: Payer: Self-pay | Admitting: Gastroenterology

## 2023-10-11 ENCOUNTER — Encounter: Payer: Self-pay | Admitting: Family Medicine

## 2023-10-11 ENCOUNTER — Ambulatory Visit (INDEPENDENT_AMBULATORY_CARE_PROVIDER_SITE_OTHER): Admitting: Family Medicine

## 2023-10-11 VITALS — BP 124/72 | HR 67 | Temp 97.6°F | Ht 64.0 in | Wt 113.1 lb

## 2023-10-11 DIAGNOSIS — H524 Presbyopia: Secondary | ICD-10-CM | POA: Diagnosis not present

## 2023-10-11 DIAGNOSIS — H353132 Nonexudative age-related macular degeneration, bilateral, intermediate dry stage: Secondary | ICD-10-CM | POA: Diagnosis not present

## 2023-10-11 DIAGNOSIS — R809 Proteinuria, unspecified: Secondary | ICD-10-CM | POA: Diagnosis not present

## 2023-10-11 DIAGNOSIS — W57XXXA Bitten or stung by nonvenomous insect and other nonvenomous arthropods, initial encounter: Secondary | ICD-10-CM | POA: Diagnosis not present

## 2023-10-11 DIAGNOSIS — S30861A Insect bite (nonvenomous) of abdominal wall, initial encounter: Secondary | ICD-10-CM | POA: Diagnosis not present

## 2023-10-11 DIAGNOSIS — K7031 Alcoholic cirrhosis of liver with ascites: Secondary | ICD-10-CM | POA: Diagnosis not present

## 2023-10-11 DIAGNOSIS — F17200 Nicotine dependence, unspecified, uncomplicated: Secondary | ICD-10-CM

## 2023-10-11 DIAGNOSIS — E119 Type 2 diabetes mellitus without complications: Secondary | ICD-10-CM | POA: Diagnosis not present

## 2023-10-11 DIAGNOSIS — H2511 Age-related nuclear cataract, right eye: Secondary | ICD-10-CM | POA: Diagnosis not present

## 2023-10-11 LAB — HM DIABETES EYE EXAM

## 2023-10-11 MED ORDER — DOXYCYCLINE HYCLATE 100 MG PO TABS: 100.0000 mg | ORAL_TABLET | Freq: Two times a day (BID) | ORAL | 0 refills | Status: DC

## 2023-10-11 NOTE — Progress Notes (Signed)
 Subjective:    Patient ID: Christine Barton, female    DOB: Aug 22, 1950, 73 y.o.   MRN: [id number]  HPI  Wt Readings from Last 3 Encounters:  10/11/23 113 lb 2 oz (51.3 kg)  08/09/23 113 lb 6.4 oz (51.4 kg)  07/22/23 116 lb (52.6 kg)   19.42 kg/m  Vitals:   10/11/23 1154  BP: 124/72  Pulse: 67  Temp: 97.6 F (36.4 C)  SpO2: 100%    Pt presents for c/o  Tick bite Also follow up of medication for microalbuminuria  Medication intolerance Cirrhosis-off spironolactone  (wants abdominal exam)     Tick bite-  Happened 2 months ago?   She was able to get it off (was a struggle)  Medium sized tick (not a deer tick) , and not engorged  Did not see a while spot  Thinks it was not on her more than a few days   Used bactroban  and neo sporin   Tick bite site is angry looking  Very itchy    No symptoms of tick fever at all  Feels fine  No rash   [place]  [place] not go out all that much   Has indoor /outdoor cats Does not treat for flea/ticks - cannot afford to     We started losartan  25 mg for microalbuminuria (renal protection)  BP Readings from Last 3 Encounters:  10/11/23 124/72  08/09/23 120/60  07/22/23 136/64   Lab Results  Component Value Date   NA 133 (L) 08/09/2023   K 4.5 08/09/2023   CO2 29 08/09/2023   GLUCOSE 148 (H) 08/09/2023   BUN 12 08/09/2023   CREATININE 0.76 08/09/2023   CALCIUM  9.5 08/09/2023   GFR 78.15 08/09/2023   EGFR 93 07/22/2023   GFRNONAA >60 04/27/2023   Na stays low likely due to lactulose  use for cirrhosis    Lab Results  Component Value Date   ALT 5 (L) 07/22/2023   AST 17 07/22/2023   ALKPHOS 72 04/27/2023   BILITOT 0.4 07/22/2023     Had some urinary retention from losartan          Patient Active Problem List   Diagnosis Date Noted   Tick bite of left flank 10/11/2023   Depression with anxiety 07/22/2023   Microalbuminuria 07/22/2023   Skin tear of forearm without complication, right,  initial encounter 01/31/2023   Laceration of left index finger 01/31/2023   Food insecurity 01/25/2023   Current use of proton pump inhibitor 12/14/2022   Osteopenia 06/17/2022   Pulmonary emphysema (HCC) 09/03/2021   Agatston CAC score, >400 07/02/2021   Left-sided chest wall pain 06/04/2021   Lung cancer (HCC) 02/25/2021   Iron  deficiency anemia 01/01/2021   Iron  deficiency anemia due to chronic blood loss 11/06/2020   AVM (arteriovenous malformation) of colon    Benign neoplasm of colon    NASH (nonalcoholic steatohepatitis)    Malnutrition (HCC) 07/25/2019   HSV infection 02/23/2019   Aortic atherosclerosis (HCC) 01/22/2019   Coronary atherosclerosis 01/22/2019   Encounter for screening for lung cancer 01/04/2019   Venous insufficiency 11/15/2018   Hyponatremia 11/07/2018   Esophageal varices without bleeding (HCC) 10/27/2018   Anemia 10/23/2018   Marasmus (HCC) 07/26/2018   Poor balance 05/30/2018   Cirrhosis of liver (HCC) 04/24/2018   Gallstones 04/24/2018   Heme positive stool 04/24/2018   Smoker 11/07/2017   Welcome to Medicare preventive visit 10/26/2016   Estrogen deficiency 10/26/2016   Colon cancer  screening 11/01/2014   Encounter for routine gynecological examination 09/12/2013   Left shoulder pain 08/20/2011   Routine general medical examination at a health care facility 07/21/2011   Vitamin D  deficiency 08/04/2009   POSTMENOPAUSAL STATUS 08/04/2009   Prediabetes 02/18/2009   Hyperlipidemia associated with type 2 diabetes mellitus (HCC) 06/14/2008   DISC DISEASE, CERVICAL 03/30/2007   History of alcohol abuse 12/06/2006   Neuropathy of both feet 12/06/2006   DIVERTICULOSIS, COLON 12/06/2006   Fatty liver 12/06/2006   BREAST CANCER, HX OF 12/06/2006   Past Medical History:  Diagnosis Date   Alcohol abuse, unspecified    Breast cancer (HCC) 1998   Right   Cataract    left eye   Cervical spondylosis 2006   MRI   Degeneration of cervical  intervertebral disc 2006   MRI   Diabetes mellitus without complication (HCC)    Diverticulosis of colon (without mention of hemorrhage)    Hyperpotassemia    Lung cancer (HCC) 03/2021   started radiation in January 2023, right side   Microscopic hematuria    Mononeuritis of unspecified site    Nonspecific abnormal results of liver function study    Other abnormal glucose    Other and unspecified hyperlipidemia    no per pt   Other chronic nonalcoholic liver disease    Personal history of chemotherapy    Personal history of malignant neoplasm of breast    Personal history of radiation therapy    Pneumothorax, acute    right, spontaneous   Tobacco use disorder    Unspecified vitamin D  deficiency    Past Surgical History:  Procedure Laterality Date   BIOPSY  07/27/2019   Procedure: BIOPSY;  Surgeon: Leigh Elspeth SQUIBB, MD;  Location: [facility];  Service: Gastroenterology;;   BREAST BIOPSY  9/03   Right   BREAST LUMPECTOMY Right 1998   CHEST TUBE INSERTION  11/02/2014   COLONOSCOPY     COLONOSCOPY WITH PROPOFOL  N/A 07/27/2019   Procedure: COLONOSCOPY WITH PROPOFOL ;  Surgeon: Leigh Elspeth SQUIBB, MD;  Location: [facility];  Service: Gastroenterology;  Laterality: N/A;   COLONOSCOPY WITH PROPOFOL  N/A 07/29/2022   Procedure: COLONOSCOPY WITH PROPOFOL ;  Surgeon: Shila Gustav GAILS, MD;  Location: [facility];  Service: Gastroenterology;  Laterality: N/A;   ESOPHAGOGASTRODUODENOSCOPY (EGD) WITH PROPOFOL  N/A 10/30/2018   Procedure: ESOPHAGOGASTRODUODENOSCOPY (EGD) WITH PROPOFOL ;  Surgeon: Shila Gustav GAILS, MD;  Location: [facility];  Service: Endoscopy;  Laterality: N/A;   ESOPHAGOGASTRODUODENOSCOPY (EGD) WITH PROPOFOL  N/A 03/12/2019   Procedure: ESOPHAGOGASTRODUODENOSCOPY (EGD) WITH PROPOFOL ;  Surgeon: Shila Gustav GAILS, MD;  Location: [facility];  Service: Endoscopy;  Laterality: N/A;   ESOPHAGOGASTRODUODENOSCOPY (EGD) WITH PROPOFOL  N/A 07/27/2019   Procedure:  ESOPHAGOGASTRODUODENOSCOPY (EGD) WITH PROPOFOL ;  Surgeon: Leigh Elspeth SQUIBB, MD;  Location: [facility];  Service: Gastroenterology;  Laterality: N/A;   EYE SURGERY  02/2017   cataract extraction with lens implant-left   HEMOSTASIS CLIP PLACEMENT  07/29/2022   Procedure: HEMOSTASIS CLIP PLACEMENT;  Surgeon: Shila Gustav GAILS, MD;  Location: [facility];  Service: Gastroenterology;;   HOT HEMOSTASIS N/A 07/27/2019   Procedure: HOT HEMOSTASIS (ARGON PLASMA COAGULATION/BICAP);  Surgeon: Leigh Elspeth SQUIBB, MD;  Location: [facility];  Service: Gastroenterology;  Laterality: N/A;   MOUTH SURGERY     POLYPECTOMY  07/27/2019   Procedure: POLYPECTOMY;  Surgeon: Leigh Elspeth SQUIBB, MD;  Location: [facility];  Service: Gastroenterology;;   POLYPECTOMY  07/29/2022   Procedure: POLYPECTOMY;  Surgeon: Shila Gustav GAILS, MD;  Location: [facility];  Service: Gastroenterology;;   TUBAL LIGATION     Social History   Tobacco Use   Smoking status: Every Day    Current packs/day: 1.00    Average packs/day: 1 pack/day for 58.9 years (58.9 ttl pk-yrs)    Types: Cigarettes    Start date: 09/25/2021    Passive exposure: Past   Smokeless tobacco: Never   Tobacco comments:    Started smoking at 73 years old.     1PPD 12/20/2022 khj  Vaping Use   Vaping status: Never Used  Substance Use Topics   Alcohol use: Not Currently   Drug use: No   Family History  Problem Relation Age of Onset   Heart failure Father    Heart attack Father    Colon cancer Maternal Uncle    Stroke Mother    Esophageal cancer Neg Hx    Rectal cancer Neg Hx    Stomach cancer Neg Hx    Pancreatic cancer Neg Hx    Allergies  Allergen Reactions   Anoro Ellipta  [Umeclidinium-Vilanterol] Cough   Bevespi  Aerosphere [Glycopyrrolate-Formoterol ] Cough    Prolonged coughing episode with first dose   Oxycodone  Nausea And Vomiting   Crestor  [Rosuvastatin ]     Abdominal pain     Glipizide  Other (See Comments)    Stomach  pain   Losartan      Pt thinks it caused urinary retention     Spironolactone      Urinary retention     Current Outpatient Medications on File Prior to Visit  Medication Sig Dispense Refill   acetaminophen  (TYLENOL ) 500 MG tablet Take 500 mg by mouth every 6 (six) hours as needed for moderate pain.     albuterol  (VENTOLIN  HFA) 108 (90 Base) MCG/ACT inhaler INHALE 2 PUFFS INTO THE LUNGS EVERY 4 HOURS AS NEEDED FOR WHEEZE OR FOR SHORTNESS OF BREATH 8.5 each 2   cholecalciferol (VITAMIN D3) 25 MCG (1000 UNIT) tablet Take 1,000 Units by mouth daily.     clorazepate  (TRANXENE ) 3.75 MG tablet Take 1 tablet (3.75 mg total) by mouth 3 (three) times daily as needed for anxiety. 90 tablet 2   cyclobenzaprine  (FLEXERIL ) 10 MG tablet TAKE 1/2 TABLET BY MOUTH AT BEDTIME AS NEEDED FOR MUSCLE SPASMS 15 tablet 3   desvenlafaxine  (PRISTIQ ) 100 MG 24 hr tablet Take 1 tablet (100 mg total) by mouth daily. 30 tablet 5   lactulose  (CONSTULOSE ) 10 GM/15ML solution Take 15 mLs (10 g total) by mouth 2 (two) times daily as needed for severe constipation. 473 mL 1   Multiple Vitamin (MULTIVITAMIN) tablet Take 1 tablet by mouth daily.     Multiple Vitamins-Minerals (PRESERVISION AREDS 2) CAPS Take 1 capsule by mouth 2 (two) times daily.     mupirocin  ointment (BACTROBAN ) 2 % APPLY 1 APPLICATION TOPICALLY 2 TIMES DAILY AS NEEDED. 15 g 3   nadolol  (CORGARD ) 20 MG tablet Take 1 tablet (20 mg total) by mouth daily. 90 tablet 3   nystatin  (MYCOSTATIN ) 100000 UNIT/ML suspension TAKE 5 MLS (500,000 UNITS TOTAL) BY MOUTH 3 (THREE) TIMES DAILY. SWISH AND SWALLOW 120 mL 0   pantoprazole  (PROTONIX ) 40 MG tablet TAKE 1 TABLET (40 MG TOTAL) BY MOUTH TWICE A DAY BEFORE MEALS 180 tablet 1   rifaximin  (XIFAXAN ) 550 MG TABS tablet Take 1 tablet (550 mg total) by mouth 2 (two) times daily. 180 tablet 3   SYSTANE COMPLETE 0.6 % SOLN Apply 1 drop to eye 2 (two) times daily.     Tiotropium Bromide Monohydrate  (  SPIRIVA  RESPIMAT) 2.5 MCG/ACT  AERS INHALE 2 PUFFS BY MOUTH INTO THE LUNGS DAILY 4 g 11   ursodiol  (ACTIGALL ) 300 MG capsule TAKE 1 CAPSULE BY MOUTH EVERY DAY 90 capsule 3   No current facility-administered medications on file prior to visit.    Review of Systems  Constitutional:  Negative for activity change, appetite change, fatigue, fever and unexpected weight change.  HENT:  Negative for congestion, ear pain, rhinorrhea, sinus pressure and sore throat.   Eyes:  Negative for pain, redness and visual disturbance.  Respiratory:  Negative for cough, shortness of breath and wheezing.   Cardiovascular:  Negative for chest pain and palpitations.  Gastrointestinal:  Negative for abdominal pain, blood in stool, constipation and diarrhea.  Endocrine: Negative for polydipsia and polyuria.  Genitourinary:  Negative for dysuria, frequency and urgency.  Musculoskeletal:  Negative for arthralgias, back pain and myalgias.  Skin:  Negative for pallor and rash.       Tick bite on left side   Allergic/Immunologic: Negative for environmental allergies.  Neurological:  Negative for dizziness, syncope and headaches.  Hematological:  Negative for adenopathy. Does not bruise/bleed easily.  Psychiatric/Behavioral:  Negative for decreased concentration and dysphoric mood. The patient is nervous/anxious.        Objective:   Physical Exam Constitutional:      General: She is not in acute distress.    Appearance: Normal appearance. She is well-developed and normal weight. She is not ill-appearing or diaphoretic.  HENT:     Head: Normocephalic and atraumatic.   Eyes:     Conjunctiva/sclera: Conjunctivae normal.     Pupils: Pupils are equal, round, and reactive to light.   Neck:     Thyroid : No thyromegaly.     Vascular: No carotid bruit or JVD.   Cardiovascular:     Rate and Rhythm: Normal rate and regular rhythm.     Heart sounds: Normal heart sounds.     No gallop.  Pulmonary:     Effort: Pulmonary effort is normal. No  respiratory distress.     Breath sounds: Normal breath sounds. No wheezing or rales.     Comments: Diffusely distant bs  Abdominal:     General: Abdomen is flat. There is no distension or abdominal bruit.     Palpations: Abdomen is soft. There is no shifting dullness, fluid wave, hepatomegaly, splenomegaly, mass or pulsatile mass.   Musculoskeletal:     Cervical back: Normal range of motion and neck supple.     Right lower leg: No edema.     Left lower leg: No edema.  Lymphadenopathy:     Cervical: No cervical adenopathy.   Skin:    General: Skin is warm and dry.     Coloration: Skin is not pale.     Findings: No rash.     Comments: Left flank  2-3 cm area of erythema and induration with scab in center (not fluctuant but firm) Mildly tender  No drainage  No rings or satellite lesions  Mildly warm to touch   No rash   Neurological:     Mental Status: She is alert.     Coordination: Coordination normal.     Deep Tendon Reflexes: Reflexes are normal and symmetric. Reflexes normal.   Psychiatric:        Mood and Affect: Mood normal.           Assessment & Plan:   Problem List Items Addressed This Visit  Digestive   Cirrhosis of liver (HCC)   Under GI care Off of spironolactone  No ascites appreciated on exam        Musculoskeletal and Integument   Tick bite of left flank - Primary   From 2 mo ago  Suspect regular sized tick (not deer tick) that probably came in on a cat  Swollen/erythematous area is sore and appears infected  Firm/not fluctuant  Otherwise reassuring exam Will treat with doxycycline  for skin infection and also for any potential tick related illness (no symptoms)  Encouraged use of warm compesses Clean with soap and water  Update if not starting to improve in a week or if worsening  Low threshold for follow up , re check   Discussed keeping cats outside since she cannot treat them for flea/tick          Other   Smoker   No  changes Not ready to quit       Microalbuminuria   Pt did not tolerate losartan  (thought it caused urinary retention) Aware this is not a listed side effect Pt wants to stay off of it   Blood pressure is stable/did not take today Removed from med list

## 2023-10-11 NOTE — Assessment & Plan Note (Signed)
 No changes  Not ready to quit

## 2023-10-11 NOTE — Assessment & Plan Note (Signed)
 Pt did not tolerate losartan  (thought it caused urinary retention) Aware this is not a listed side effect Pt wants to stay off of it   Blood pressure is stable/did not take today Removed from med list

## 2023-10-11 NOTE — Assessment & Plan Note (Signed)
 From 2 mo ago  Suspect regular sized tick (not deer tick) that probably came in on a cat  Swollen/erythematous area is sore and appears infected  Firm/not fluctuant  Otherwise reassuring exam Will treat with doxycycline  for skin infection and also for any potential tick related illness (no symptoms)  Encouraged use of warm compesses Clean with soap and water  Update if not starting to improve in a week or if worsening  Low threshold for follow up , re check   Discussed keeping cats outside since she cannot treat them for flea/tick

## 2023-10-11 NOTE — Assessment & Plan Note (Signed)
 Under GI care Off of spironolactone  No ascites appreciated on exam

## 2023-10-11 NOTE — Patient Instructions (Addendum)
 Stay off the losartan  since it caused you to have urinary retention   I want to start on an antibiotic for the tick bite  I do worry it is infected  Start doxycycline  and take it as directed Use warm compress on it as well   Keep clean with soap and water  If it drains - that is fine, just keep it clean   If it gets worse - more red/swollen or painful please call  Update if not starting to improve in a week or if worsening  If you get a fever or rash - also let us  know

## 2023-10-17 ENCOUNTER — Inpatient Hospital Stay: Payer: Medicare HMO | Attending: Internal Medicine

## 2023-10-17 ENCOUNTER — Ambulatory Visit (HOSPITAL_COMMUNITY)
Admission: RE | Admit: 2023-10-17 | Discharge: 2023-10-17 | Disposition: A | Discharge status: Home / Self Care | Source: Ambulatory Visit | Attending: Internal Medicine | Admitting: Internal Medicine

## 2023-10-17 DIAGNOSIS — D500 Iron deficiency anemia secondary to blood loss (chronic): Secondary | ICD-10-CM | POA: Insufficient documentation

## 2023-10-17 DIAGNOSIS — R195 Other fecal abnormalities: Secondary | ICD-10-CM | POA: Diagnosis not present

## 2023-10-17 DIAGNOSIS — E042 Nontoxic multinodular goiter: Secondary | ICD-10-CM | POA: Diagnosis not present

## 2023-10-17 DIAGNOSIS — Z87891 Personal history of nicotine dependence: Secondary | ICD-10-CM | POA: Insufficient documentation

## 2023-10-17 DIAGNOSIS — C3490 Malignant neoplasm of unspecified part of unspecified bronchus or lung: Secondary | ICD-10-CM

## 2023-10-17 LAB — CMP (CANCER CENTER ONLY)
ALT: 8 U/L (ref 0–44)
AST: 18 U/L (ref 15–41)
Albumin: 4.2 g/dL (ref 3.5–5.0)
Alkaline Phosphatase: 78 U/L (ref 38–126)
Anion gap: 6 (ref 5–15)
BUN: 13 mg/dL (ref 8–23)
CO2: 30 mmol/L (ref 22–32)
Calcium: 10.2 mg/dL (ref 8.9–10.3)
Chloride: 97 mmol/L — ABNORMAL LOW (ref 98–111)
Creatinine: 0.77 mg/dL (ref 0.44–1.00)
GFR, Estimated: >60 mL/min (ref >60)
Glucose, Bld: 152 mg/dL — ABNORMAL HIGH (ref 70–99)
Potassium: 4.4 mmol/L (ref 3.5–5.1)
Sodium: 133 mmol/L — ABNORMAL LOW (ref 135–145)
Total Bilirubin: 0.5 mg/dL (ref 0.0–1.2)
Total Protein: 7.2 g/dL (ref 6.5–8.1)

## 2023-10-17 LAB — CBC WITH DIFFERENTIAL (CANCER CENTER ONLY)
Abs Immature Granulocytes: 0.1 K/uL — ABNORMAL HIGH (ref 0.00–0.07)
Basophils Absolute: 0.1 K/uL (ref 0.0–0.1)
Basophils Relative: 1 %
Eosinophils Absolute: 0.1 K/uL (ref 0.0–0.5)
Eosinophils Relative: 2 %
HCT: 41.1 % (ref 36.0–46.0)
Hemoglobin: 14.2 g/dL (ref 12.0–15.0)
Immature Granulocytes: 1 %
Lymphocytes Relative: 18 %
Lymphs Abs: 1.3 K/uL (ref 0.7–4.0)
MCH: 33 pg (ref 26.0–34.0)
MCHC: 34.5 g/dL (ref 30.0–36.0)
MCV: 95.6 fL (ref 80.0–100.0)
Monocytes Absolute: 0.5 K/uL (ref 0.1–1.0)
Monocytes Relative: 7 %
Neutro Abs: 5.2 K/uL (ref 1.7–7.7)
Neutrophils Relative %: 71 %
Platelet Count: 174 K/uL (ref 150–400)
RBC: 4.3 MIL/uL (ref 3.87–5.11)
RDW: 13.8 % (ref 11.5–15.5)
WBC Count: 7.3 K/uL (ref 4.0–10.5)
nRBC: 0 % (ref 0.0–0.2)

## 2023-10-17 LAB — IRON AND IRON BINDING CAPACITY (CC-WL,HP ONLY)
Iron: 83 ug/dL (ref 28–170)
Saturation Ratios: 17 % (ref 10.4–31.8)
TIBC: 480 ug/dL — ABNORMAL HIGH (ref 250–450)
UIBC: 397 ug/dL (ref 148–442)

## 2023-10-18 LAB — FERRITIN: Ferritin: 27 ng/mL (ref 11–307)

## 2023-10-24 ENCOUNTER — Encounter: Payer: Self-pay | Admitting: Gastroenterology

## 2023-10-24 ENCOUNTER — Ambulatory Visit: Admitting: Gastroenterology

## 2023-10-24 ENCOUNTER — Inpatient Hospital Stay (HOSPITAL_BASED_OUTPATIENT_CLINIC_OR_DEPARTMENT_OTHER): Payer: Medicare HMO | Admitting: Internal Medicine

## 2023-10-24 VITALS — BP 118/62 | HR 82 | Ht 64.0 in | Wt 112.0 lb

## 2023-10-24 VITALS — BP 143/72 | HR 94 | Temp 97.3°F | Resp 16 | Wt 112.0 lb

## 2023-10-24 DIAGNOSIS — K7682 Hepatic encephalopathy: Secondary | ICD-10-CM

## 2023-10-24 DIAGNOSIS — R195 Other fecal abnormalities: Secondary | ICD-10-CM | POA: Diagnosis not present

## 2023-10-24 DIAGNOSIS — C3490 Malignant neoplasm of unspecified part of unspecified bronchus or lung: Secondary | ICD-10-CM | POA: Diagnosis not present

## 2023-10-24 DIAGNOSIS — Z87891 Personal history of nicotine dependence: Secondary | ICD-10-CM | POA: Diagnosis not present

## 2023-10-24 DIAGNOSIS — K7460 Unspecified cirrhosis of liver: Secondary | ICD-10-CM | POA: Diagnosis not present

## 2023-10-24 DIAGNOSIS — K7290 Hepatic failure, unspecified without coma: Secondary | ICD-10-CM

## 2023-10-24 DIAGNOSIS — D500 Iron deficiency anemia secondary to blood loss (chronic): Secondary | ICD-10-CM | POA: Diagnosis not present

## 2023-10-24 NOTE — Progress Notes (Signed)
 You                Christine Barton    [id number]    11-21-1950  Primary Care Physician:Tower, Laine LABOR, MD  Referring Physician: Tower, Laine LABOR, MD [address] [place],  [place] [address]   Chief complaint:  Cirrhosis  Discussed the use of AI scribe software for clinical note transcription with the patient, who gave verbal consent to proceed.  History of Present Illness Christine Barton is a 73 year old female with decompensated cirrhosis who presents for follow-up on her liver condition.  Decompensated cirrhosis and complications - Diagnosed with decompensated cirrhosis, initially identified as stage four fibrosis - Manages hepatic encephalopathy and varices with medication - Currently stable without recent need for medication for fluid accumulation - No recent episodes of hepatic encephalopathy or variceal bleeding  Alcohol use - Previously consumed alcohol, which was a major risk factor for liver disease - Has stopped consuming alcohol  Laboratory monitoring - Undergoes regular blood work to monitor liver function - Last laboratory tests in July showed stable results - Next blood work scheduled for October - Plans to continue laboratory monitoring every three months    GI Hx: Reviewed by me for you.  We also also stool 12 years patient with a small (2000 desomorphine supplement programs that go on and then you do not want to do provide is patient with abnormal follow-up and medication [facility] Colonoscopy 07-29-22 - Hemorrhoids found on perianal exam.  - Five 4 to 7 mm polyps in the transverse colon, in the ascending colon and in the cecum, removed with a cold snare. Resected and retrieved. Clips were placed. Clip manufacturer: [employer].  - Diverticulosis in the sigmoid colon, in the descending colon, in the transverse colon and in the ascending colon.  - Non-bleeding internal hemorrhoids. A. COLON, CECUM, ASCENDING, POLYPECTOMY:  - Tubular adenoma (one fragment)   - Sessile serrated polyp without cytologic dysplasia (one fragment)  - Colonic mucosa with surface hyperplastic changes (multiple fragments)  - Negative for high-grade dysplasia or malignancy   B. COLON, TRANSVERSE, POLYPECTOMY:  - Tubular adenoma(s) (two fragments)  - Negative for high-grade dysplasia or malignancy   MR abdomen w wo contrast 01-12-22 1. No definite evidence of metastatic disease in the abdomen. 2. Advanced cirrhosis with developing hepatic fibrosis in a background of hepatic iron  deposition. 3. Cholelithiasis without evidence of acute cholecystitis. 4. Chronic dilatation of the common bile duct. No choledocholithiasis or definite findings to suggest obstruction.   MRI abdomen/liver December 31, 2020 1. Cirrhosis. No liver masses. 2. Trace perihepatic ascites.  Normal size spleen. 3. Cholelithiasis. Chronic mild central intrahepatic and extrahepatic biliary ductal dilatation, unchanged. CBD diameter 9 mm. No evidence of choledocholithiasis. 4. Chronic mild dilation of the dorsal pancreatic duct in the pancreatic head, unchanged, nonspecific. No pancreatic mass.   Abdominal ultrasound April 30, 2020 1. Cirrhotic liver morphology.  No focal liver lesion is identified. 2. Cholelithiasis with a positive sonographic Murphy sign. Findings raise suspicion for acute cholecystitis.   Normal AFP 3.7 in January 2022 MRI liver 01-08-2020: Stable cirrhosis with no evidence of hepatocellular carcinoma. Choledocholithiasis with stable CBD. Increased iron  deposition in the setting of IV iron  therapy   EGD July 27, 2019 by Dr. Leigh: Small esophageal varices and portal hypertensive gastropathy otherwise normal exam   Colonoscopy July 27, 2019: 8 small AVMs scattered in the colon ablated with APC and 3 small polyps removed, diverticulosis and internal hemorrhoids  EGD February 23, 2019: Small grade 1 esophageal varices, portal hypertensive gastropathy   Colonoscopy  November 30, 2016: Diverticulosis, sessile polyps X4 tubular adenomas and internal hemorrhoids.  Recall colonoscopy in 3 years   Outpatient Encounter Medications as of 10/24/2023  Medication Sig   acetaminophen  (TYLENOL ) 500 MG tablet Take 500 mg by mouth every 6 (six) hours as needed for moderate pain.   albuterol  (VENTOLIN  HFA) 108 (90 Base) MCG/ACT inhaler INHALE 2 PUFFS INTO THE LUNGS EVERY 4 HOURS AS NEEDED FOR WHEEZE OR FOR SHORTNESS OF BREATH   cholecalciferol (VITAMIN D3) 25 MCG (1000 UNIT) tablet Take 1,000 Units by mouth daily.   clorazepate  (TRANXENE ) 3.75 MG tablet Take 1 tablet (3.75 mg total) by mouth 3 (three) times daily as needed for anxiety.   cyclobenzaprine  (FLEXERIL ) 10 MG tablet TAKE 1/2 TABLET BY MOUTH AT BEDTIME AS NEEDED FOR MUSCLE SPASMS   desvenlafaxine  (PRISTIQ ) 100 MG 24 hr tablet Take 1 tablet (100 mg total) by mouth daily.   doxycycline  (VIBRA -TABS) 100 MG tablet Take 1 tablet (100 mg total) by mouth 2 (two) times daily.   lactulose  (CONSTULOSE ) 10 GM/15ML solution Take 15 mLs (10 g total) by mouth 2 (two) times daily as needed for severe constipation.   Multiple Vitamin (MULTIVITAMIN) tablet Take 1 tablet by mouth daily.   Multiple Vitamins-Minerals (PRESERVISION AREDS 2) CAPS Take 1 capsule by mouth 2 (two) times daily.   mupirocin  ointment (BACTROBAN ) 2 % APPLY 1 APPLICATION TOPICALLY 2 TIMES DAILY AS NEEDED.   nadolol  (CORGARD ) 20 MG tablet Take 1 tablet (20 mg total) by mouth daily.   nystatin  (MYCOSTATIN ) 100000 UNIT/ML suspension TAKE 5 MLS (500,000 UNITS TOTAL) BY MOUTH 3 (THREE) TIMES DAILY. SWISH AND SWALLOW   pantoprazole  (PROTONIX ) 40 MG tablet TAKE 1 TABLET (40 MG TOTAL) BY MOUTH TWICE A DAY BEFORE MEALS   rifaximin  (XIFAXAN ) 550 MG TABS tablet Take 1 tablet (550 mg total) by mouth 2 (two) times daily.   SYSTANE COMPLETE 0.6 % SOLN Apply 1 drop to eye 2 (two) times daily.   Tiotropium Bromide Monohydrate  (SPIRIVA  RESPIMAT) 2.5 MCG/ACT AERS INHALE 2 PUFFS  BY MOUTH INTO THE LUNGS DAILY   ursodiol  (ACTIGALL ) 300 MG capsule TAKE 1 CAPSULE BY MOUTH EVERY DAY   No facility-administered encounter medications on file as of 10/24/2023.    Allergies as of 10/24/2023 - Review Complete 10/24/2023  Allergen Reaction Noted   Anoro ellipta  [umeclidinium-vilanterol] Cough 08/03/2022   Bevespi  aerosphere [glycopyrrolate-formoterol ] Cough 08/03/2022   Oxycodone  Nausea And Vomiting 11/02/2014   Crestor  [rosuvastatin ]  11/12/2020   Glipizide  Other (See Comments) 09/12/2013   Losartan   10/11/2023   Spironolactone   10/11/2023    Past Medical History:  Diagnosis Date   Alcohol abuse, unspecified    Breast cancer (HCC) 1998   Right   Cataract    left eye   Cervical spondylosis 2006   MRI   Degeneration of cervical intervertebral disc 2006   MRI   Diabetes mellitus without complication (HCC)    Diverticulosis of colon (without mention of hemorrhage)    Hyperpotassemia    Lung cancer (HCC) 03/2021   started radiation in January 2023, right side   Microscopic hematuria    Mononeuritis of unspecified site    Nonspecific abnormal results of liver function study    Other abnormal glucose    Other and unspecified hyperlipidemia    no per pt   Other chronic nonalcoholic liver disease    Personal history of chemotherapy  Personal history of malignant neoplasm of breast    Personal history of radiation therapy    Pneumothorax, acute    right, spontaneous   Tobacco use disorder    Unspecified vitamin D  deficiency     Past Surgical History:  Procedure Laterality Date   BIOPSY  07/27/2019   Procedure: BIOPSY;  Surgeon: Leigh Elspeth SQUIBB, MD;  Location: [facility];  Service: Gastroenterology;;   BREAST BIOPSY  9/03   Right   BREAST LUMPECTOMY Right 1998   CHEST TUBE INSERTION  11/02/2014   COLONOSCOPY     COLONOSCOPY WITH PROPOFOL  N/A 07/27/2019   Procedure: COLONOSCOPY WITH PROPOFOL ;  Surgeon: Leigh Elspeth SQUIBB, MD;  Location: [facility];  Service: Gastroenterology;  Laterality: N/A;   COLONOSCOPY WITH PROPOFOL  N/A 07/29/2022   Procedure: COLONOSCOPY WITH PROPOFOL ;  Surgeon: Shila Gustav GAILS, MD;  Location: [facility];  Service: Gastroenterology;  Laterality: N/A;   ESOPHAGOGASTRODUODENOSCOPY (EGD) WITH PROPOFOL  N/A 10/30/2018   Procedure: ESOPHAGOGASTRODUODENOSCOPY (EGD) WITH PROPOFOL ;  Surgeon: Shila Gustav GAILS, MD;  Location: [facility];  Service: Endoscopy;  Laterality: N/A;   ESOPHAGOGASTRODUODENOSCOPY (EGD) WITH PROPOFOL  N/A 03/12/2019   Procedure: ESOPHAGOGASTRODUODENOSCOPY (EGD) WITH PROPOFOL ;  Surgeon: Shila Gustav GAILS, MD;  Location: [facility];  Service: Endoscopy;  Laterality: N/A;   ESOPHAGOGASTRODUODENOSCOPY (EGD) WITH PROPOFOL  N/A 07/27/2019   Procedure: ESOPHAGOGASTRODUODENOSCOPY (EGD) WITH PROPOFOL ;  Surgeon: Leigh Elspeth SQUIBB, MD;  Location: [facility];  Service: Gastroenterology;  Laterality: N/A;   EYE SURGERY  02/2017   cataract extraction with lens implant-left   HEMOSTASIS CLIP PLACEMENT  07/29/2022   Procedure: HEMOSTASIS CLIP PLACEMENT;  Surgeon: Shila Gustav GAILS, MD;  Location: [facility];  Service: Gastroenterology;;   HOT HEMOSTASIS N/A 07/27/2019   Procedure: HOT HEMOSTASIS (ARGON PLASMA COAGULATION/BICAP);  Surgeon: Leigh Elspeth SQUIBB, MD;  Location: [facility];  Service: Gastroenterology;  Laterality: N/A;   MOUTH SURGERY     POLYPECTOMY  07/27/2019   Procedure: POLYPECTOMY;  Surgeon: Leigh Elspeth SQUIBB, MD;  Location: [facility];  Service: Gastroenterology;;   POLYPECTOMY  07/29/2022   Procedure: POLYPECTOMY;  Surgeon: Shila Gustav GAILS, MD;  Location: [facility];  Service: Gastroenterology;;   TUBAL LIGATION      Family History  Problem Relation Age of Onset   Heart failure Father    Heart attack Father    Colon cancer Maternal Uncle    Stroke Mother    Esophageal cancer Neg Hx    Rectal cancer Neg Hx    Stomach cancer Neg Hx    Pancreatic cancer  Neg Hx     Social History   Socioeconomic History   Marital status: Single    Spouse name: Not on file   Number of children: 1   Years of education: Not on file   Highest education level: Not on file  Occupational History   Occupation: retired    Associate Professor: [employer]  Tobacco Use   Smoking status: Every Day    Current packs/day: 1.00    Average packs/day: 1 pack/day for 59.0 years (59.0 ttl pk-yrs)    Types: Cigarettes    Start date: 09/25/2021    Passive exposure: Past   Smokeless tobacco: Never   Tobacco comments:    Started smoking at 73 years old.     1PPD 12/20/2022 khj  Vaping Use   Vaping status: Never Used  Substance and Sexual Activity   Alcohol use: Not Currently   Drug use: No   Sexual activity: Not Currently  Other Topics Concern  Not on file  Social History Narrative   Divorced      1 child      Works at [employer]         Social Drivers of [employer]: Low Risk  (04/01/2023)   Overall Financial Resource Strain (CARDIA)    Difficulty of Paying Living Expenses: Not hard at all  Food Insecurity: No Food Insecurity (04/01/2023)   Hunger Vital Sign    Worried About Running Out of Food in the Last Year: Never true    Ran Out of Food in the Last Year: Never true  Recent Concern: Food Insecurity - Food Insecurity Present (03/03/2023)   Hunger Vital Sign    Worried About Running Out of Food in the Last Year: Never true    Ran Out of Food in the Last Year: Sometimes true  Transportation Needs: No Transportation Needs (04/01/2023)   PRAPARE - Administrator, Civil Service (Medical): No    Lack of Transportation (Non-Medical): No  Physical Activity: Inactive (04/01/2023)   Exercise Vital Sign    Days of Exercise per Week: 0 days    Minutes of Exercise per Session: 0 min  Stress: No Stress Concern Present (04/01/2023)   [employer] of Occupational Health - Occupational Stress Questionnaire    Feeling  of Stress : Not at all  Social Connections: Unknown (04/01/2023)   Social Connection and Isolation Panel    Frequency of Communication with Friends and Family: Never    Frequency of Social Gatherings with Friends and Family: Never    Attends Religious Services: Never    Database administrator or Organizations: No    Attends Banker Meetings: Never    Marital Status: Not on file  Intimate Partner Violence: Not At Risk (04/01/2023)   Humiliation, Afraid, Rape, and Kick questionnaire    Fear of Current or Ex-Partner: No    Emotionally Abused: No    Physically Abused: No    Sexually Abused: No      Review of systems: All other review of systems negative except as mentioned in the HPI.   Physical Exam: Vitals:   10/24/23 1409  BP: 118/62  Pulse: 82   Body mass index is 19.22 kg/m. Gen:      No acute distress HEENT:  sclera anicteric Abd:      soft, non-tender; no palpable masses, no distension Ext:    No edema Neuro: alert and oriented x 3 Psych: normal mood and affect  Data Reviewed:  Reviewed labs, radiology imaging, old records and pertinent past GI work up     Assessment and Plan Assessment & Plan Decompensated cirrhosis Decompensated cirrhosis with complications is being well-managed. The condition is well-controlled with no current need for diuretics for ascites. She is on medication for hyperammonemia and esophageal varices. The liver disease is well-managed due to cessation of alcohol consumption, which was the primary risk factor. The condition is irreversible, but there are no signs of hepatocellular carcinoma, and regular monitoring is in place to check for complications. - Continue current medications for  hepatic encephalopathy and esophageal varices. - Avoid alcohol to prevent further liver damage. - Schedule blood work in January. - Plan follow-up appointment in six months unless condition changes.        This visit required 40  minutes of patient care (this includes precharting, chart review, review of results, face-to-face time used for counseling as well as treatment plan and follow-up.  The patient was provided an opportunity to ask questions and all were answered. The patient agreed with the plan and demonstrated an understanding of the instructions.  LOIS Wilkie Mcgee , MD    CC: Tower, Laine LABOR, MD

## 2023-10-24 NOTE — Progress Notes (Signed)
 [facility] [phone]   [phone]  OFFICE PROGRESS NOTE  Tower, Laine LABOR, MD [address] [place] [address]  DIAGNOSIS:  1) Anemia secondary to GI occult blood loss.   2) suspicious for stage Ia (T1 a, N0, M0) lung cancer.  The patient declined bronchoscopy for tissue biopsy.  PRIOR THERAPY:  1) Oral iron  supplements. Discontinued due to intolerance (severe abdominal pain) 2) status post SBRT to the lung lesion under the care of Dr. Camelia at [facility].  CURRENT THERAPY: IV iron  infusions with Venofer .   INTERVAL HISTORY: Christine Barton 73 y.o. female returns to the clinic today for follow-up visit.Discussed the use of AI scribe software for clinical note transcription with the patient, who gave verbal consent to proceed.  History of Present Illness   Christine Barton is a 73 year old female with anemia secondary to gastrointestinal hemorrhage and stage one A non-small cell lung cancer who presents for evaluation with repeat CT scan of the chest and blood work.  She has a history of anemia secondary to gastrointestinal hemorrhage and has previously received iron  infusion with Venofer . Current blood work shows no anemia, with hemoglobin levels at 14.2.  She has a history of stage one A non-small cell lung cancer and is currently in an observation period post-stereotactic body radiation therapy (SBRT). A recent CT scan of the chest shows no growth.  She experiences fatigue, describing it as 'no energy', and does not engage in regular exercise, which may contribute to her fatigue. She has 'no appetite' but maintains her weight by consuming nutritional supplements like Ensure. No weight loss is reported.  There is a mention of liver cirrhosis, which she associates with a previous misclassification as stage four cancer based on initial blood tests. However, there is no current evidence of hepatocellular carcinoma, and her  alpha-fetoprotein levels were normal in January 2022.       MEDICAL HISTORY: Past Medical History:  Diagnosis Date   Alcohol abuse, unspecified    Breast cancer (HCC) 1998   Right   Cataract    left eye   Cervical spondylosis 2006   MRI   Degeneration of cervical intervertebral disc 2006   MRI   Diabetes mellitus without complication (HCC)    Diverticulosis of colon (without mention of hemorrhage)    Hyperpotassemia    Lung cancer (HCC) 03/2021   started radiation in January 2023, right side   Microscopic hematuria    Mononeuritis of unspecified site    Nonspecific abnormal results of liver function study    Other abnormal glucose    Other and unspecified hyperlipidemia    no per pt   Other chronic nonalcoholic liver disease    Personal history of chemotherapy    Personal history of malignant neoplasm of breast    Personal history of radiation therapy    Pneumothorax, acute    right, spontaneous   Tobacco use disorder    Unspecified vitamin D  deficiency     ALLERGIES:  is allergic to anoro ellipta  [umeclidinium-vilanterol], bevespi  aerosphere [glycopyrrolate-formoterol ], oxycodone , crestor  [rosuvastatin ], glipizide , losartan , and spironolactone .  MEDICATIONS:  Current Outpatient Medications  Medication Sig Dispense Refill   acetaminophen  (TYLENOL ) 500 MG tablet Take 500 mg by mouth every 6 (six) hours as needed for moderate pain.     albuterol  (VENTOLIN  HFA) 108 (90 Base) MCG/ACT inhaler INHALE 2 PUFFS INTO THE LUNGS EVERY 4 HOURS AS NEEDED FOR WHEEZE  OR FOR SHORTNESS OF BREATH 8.5 each 2   cholecalciferol (VITAMIN D3) 25 MCG (1000 UNIT) tablet Take 1,000 Units by mouth daily.     clorazepate  (TRANXENE ) 3.75 MG tablet Take 1 tablet (3.75 mg total) by mouth 3 (three) times daily as needed for anxiety. 90 tablet 2   cyclobenzaprine  (FLEXERIL ) 10 MG tablet TAKE 1/2 TABLET BY MOUTH AT BEDTIME AS NEEDED FOR MUSCLE SPASMS 15 tablet 3   desvenlafaxine  (PRISTIQ ) 100 MG 24 hr  tablet Take 1 tablet (100 mg total) by mouth daily. 30 tablet 5   doxycycline  (VIBRA -TABS) 100 MG tablet Take 1 tablet (100 mg total) by mouth 2 (two) times daily. 20 tablet 0   lactulose  (CONSTULOSE ) 10 GM/15ML solution Take 15 mLs (10 g total) by mouth 2 (two) times daily as needed for severe constipation. 473 mL 1   Multiple Vitamin (MULTIVITAMIN) tablet Take 1 tablet by mouth daily.     Multiple Vitamins-Minerals (PRESERVISION AREDS 2) CAPS Take 1 capsule by mouth 2 (two) times daily.     mupirocin  ointment (BACTROBAN ) 2 % APPLY 1 APPLICATION TOPICALLY 2 TIMES DAILY AS NEEDED. 15 g 3   nadolol  (CORGARD ) 20 MG tablet Take 1 tablet (20 mg total) by mouth daily. 90 tablet 3   nystatin  (MYCOSTATIN ) 100000 UNIT/ML suspension TAKE 5 MLS (500,000 UNITS TOTAL) BY MOUTH 3 (THREE) TIMES DAILY. SWISH AND SWALLOW 120 mL 0   pantoprazole  (PROTONIX ) 40 MG tablet TAKE 1 TABLET (40 MG TOTAL) BY MOUTH TWICE A DAY BEFORE MEALS 180 tablet 1   rifaximin  (XIFAXAN ) 550 MG TABS tablet Take 1 tablet (550 mg total) by mouth 2 (two) times daily. 180 tablet 3   SYSTANE COMPLETE 0.6 % SOLN Apply 1 drop to eye 2 (two) times daily.     Tiotropium Bromide Monohydrate  (SPIRIVA  RESPIMAT) 2.5 MCG/ACT AERS INHALE 2 PUFFS BY MOUTH INTO THE LUNGS DAILY 4 g 11   ursodiol  (ACTIGALL ) 300 MG capsule TAKE 1 CAPSULE BY MOUTH EVERY DAY 90 capsule 3   No current facility-administered medications for this visit.    SURGICAL HISTORY:  Past Surgical History:  Procedure Laterality Date   BIOPSY  07/27/2019   Procedure: BIOPSY;  Surgeon: Leigh Elspeth SQUIBB, MD;  Location: [facility];  Service: Gastroenterology;;   BREAST BIOPSY  9/03   Right   BREAST LUMPECTOMY Right 1998   CHEST TUBE INSERTION  11/02/2014   COLONOSCOPY     COLONOSCOPY WITH PROPOFOL  N/A 07/27/2019   Procedure: COLONOSCOPY WITH PROPOFOL ;  Surgeon: Leigh Elspeth SQUIBB, MD;  Location: [facility];  Service: Gastroenterology;  Laterality: N/A;   COLONOSCOPY WITH  PROPOFOL  N/A 07/29/2022   Procedure: COLONOSCOPY WITH PROPOFOL ;  Surgeon: Shila Gustav GAILS, MD;  Location: [facility];  Service: Gastroenterology;  Laterality: N/A;   ESOPHAGOGASTRODUODENOSCOPY (EGD) WITH PROPOFOL  N/A 10/30/2018   Procedure: ESOPHAGOGASTRODUODENOSCOPY (EGD) WITH PROPOFOL ;  Surgeon: Shila Gustav GAILS, MD;  Location: [facility];  Service: Endoscopy;  Laterality: N/A;   ESOPHAGOGASTRODUODENOSCOPY (EGD) WITH PROPOFOL  N/A 03/12/2019   Procedure: ESOPHAGOGASTRODUODENOSCOPY (EGD) WITH PROPOFOL ;  Surgeon: Shila Gustav GAILS, MD;  Location: [facility];  Service: Endoscopy;  Laterality: N/A;   ESOPHAGOGASTRODUODENOSCOPY (EGD) WITH PROPOFOL  N/A 07/27/2019   Procedure: ESOPHAGOGASTRODUODENOSCOPY (EGD) WITH PROPOFOL ;  Surgeon: Leigh Elspeth SQUIBB, MD;  Location: [facility];  Service: Gastroenterology;  Laterality: N/A;   EYE SURGERY  02/2017   cataract extraction with lens implant-left   HEMOSTASIS CLIP PLACEMENT  07/29/2022   Procedure: HEMOSTASIS CLIP PLACEMENT;  Surgeon: Nandigam, Kavitha V, MD;  Location: [facility];  Service: Gastroenterology;;   HOT HEMOSTASIS N/A 07/27/2019   Procedure: HOT HEMOSTASIS (ARGON PLASMA COAGULATION/BICAP);  Surgeon: Leigh Elspeth SQUIBB, MD;  Location: [facility];  Service: Gastroenterology;  Laterality: N/A;   MOUTH SURGERY     POLYPECTOMY  07/27/2019   Procedure: POLYPECTOMY;  Surgeon: Leigh Elspeth SQUIBB, MD;  Location: [facility];  Service: Gastroenterology;;   POLYPECTOMY  07/29/2022   Procedure: POLYPECTOMY;  Surgeon: Shila Gustav GAILS, MD;  Location: [facility];  Service: Gastroenterology;;   TUBAL LIGATION      REVIEW OF SYSTEMS:  Constitutional: positive for fatigue Eyes: negative Ears, nose, mouth, throat, and face: negative Respiratory: negative Cardiovascular: negative Gastrointestinal: negative Genitourinary:negative Integument/breast: negative Hematologic/lymphatic: negative Musculoskeletal:negative Neurological:  negative Behavioral/Psych: negative Endocrine: negative Allergic/Immunologic: negative   PHYSICAL EXAMINATION: General appearance: alert, cooperative, fatigued, and no distress Head: Normocephalic, without obvious abnormality, atraumatic Neck: no adenopathy, no JVD, supple, symmetrical, trachea midline, and thyroid  not enlarged, symmetric, no tenderness/mass/nodules Lymph nodes: Cervical, supraclavicular, and axillary nodes normal. Resp: clear to auscultation bilaterally Back: symmetric, no curvature. ROM normal. No CVA tenderness. Cardio: regular rate and rhythm, S1, S2 normal, no murmur, click, rub or gallop GI: soft, non-tender; bowel sounds normal; no masses,  no organomegaly Extremities: extremities normal, atraumatic, no cyanosis or edema Neurologic: Alert and oriented X 3, normal strength and tone. Normal symmetric reflexes. Normal coordination and gait  ECOG PERFORMANCE STATUS: 1 - Symptomatic but completely ambulatory  Blood pressure (!) 143/72, pulse 94, temperature (!) 97.3 F (36.3 C), temperature source Temporal, resp. rate 16, weight 112 lb (50.8 kg), SpO2 99%.  LABORATORY DATA: Lab Results  Component Value Date   WBC 7.3 10/17/2023   HGB 14.2 10/17/2023   HCT 41.1 10/17/2023   MCV 95.6 10/17/2023   PLT 174 10/17/2023      Chemistry      Component Value Date/Time   NA 133 (L) 10/17/2023 1311   NA 132 (L) 05/26/2021 1147   K 4.4 10/17/2023 1311   CL 97 (L) 10/17/2023 1311   CO2 30 10/17/2023 1311   BUN 13 10/17/2023 1311   BUN 21 05/26/2021 1147   CREATININE 0.77 10/17/2023 1311   CREATININE 0.65 07/22/2023 1508      Component Value Date/Time   CALCIUM  10.2 10/17/2023 1311   ALKPHOS 78 10/17/2023 1311   AST 18 10/17/2023 1311   ALT 8 10/17/2023 1311   BILITOT 0.5 10/17/2023 1311       RADIOGRAPHIC STUDIES: CT Chest Wo Contrast Result Date: 10/18/2023 EXAM: CT CHEST WITHOUT CONTRAST 10/17/2023 03:25:00 PM TECHNIQUE: CT of the chest was performed  without the administration of intravenous contrast. Multiplanar reformatted images are provided for review. Automated exposure control, iterative reconstruction, and/or weight based adjustment of the mA/kV was utilized to reduce the radiation dose to as low as reasonably achievable. COMPARISON: 10/11/2023 CLINICAL HISTORY: Non-small cell lung cancer (NSCLC), staging. Non-small cell lung cancer (NSCLC), staging, Malignant neoplasm of unspecified part of unspecified bronchus or lung (HCC) FINDINGS: MEDIASTINUM: Heart and pericardium are unremarkable. The central airways are clear. LYMPH NODES: No mediastinal or hilar lymphadenopathy. Status post right axillary lymph node dissection. LUNGS AND PLEURA: Radiation changes/scarring in the anterior right lung apex with dystrophic calcifications. Additional radiation changes in the superior segment right lower lobe with underlying 9 x 16 mm nodular opacity (image 50), previously 8 x 18 mm, stable. Additional radiation changes in the anterior right hemithorax. No pleural effusion or pneumothorax. SOFT TISSUES/BONES: Stable left thyroid  nodules measuring  up to 1.5 cm, previously evaluated on thyroid  ultrasound. No acute abnormality of the bones or soft tissues. UPPER ABDOMEN: Mildly nodular hepatic contour, reflecting cirrhosis. IMPRESSION: 1. Radiation changes in the right hemithorax, as above. 2. Stable underlying nodularity in the superior segment right lower lobe, non FDG-avid on prior PET, likely benign. 3. No evidence of recurrent or metastatic disease. Electronically signed by: Pinkie Pebbles MD 10/18/2023 11:37 PM EDT RP Workstation: [id number]    ASSESSMENT AND PLAN: This is a very pleasant 73 years old white female with iron  deficiency anemia secondary to likely chronic blood loss secondary to AV malformation as well as lack of dietary iron  supplements.  She has intolerance to the oral iron  tablets. She has a history of a stage Ia lung cancer status post SBRT  to the right lower lobe lung nodule. She is currently on observation. Assessment and Plan    Non-small cell lung cancer Stage 1A non-small cell lung cancer, status post stereotactic body radiation therapy. Recent chest CT shows no disease progression, indicating well-managed disease. Clinically asymptomatic with no new symptoms related to lung cancer. - Continue observation - Schedule annual follow-up with chest CT scan  Liver cirrhosis Liver cirrhosis with no evidence of hepatocellular carcinoma based on alpha-fetoprotein levels. Previous classification of stage 4 cancer was a misunderstanding. Under the care of Dr. Nandigam for management. - Continue management with Dr. Shila  Anemia secondary to gastrointestinal hemorrhage Anemia secondary to gastrointestinal hemorrhage resolved with previous Venofer  infusion. Current hemoglobin levels are normal at 14.2. Reports fatigue, not attributed to anemia as iron  levels are adequate. - Encourage regular physical activity to improve energy levels   The patient was advised to call immediately if she has any concerning symptoms in the interval.  The patient voices understanding of current disease status and treatment options and is in agreement with the current care plan. All questions were answered. The patient knows to call the clinic with any problems, questions or concerns. We can certainly see the patient much sooner if necessary. The total time spent in the appointment was 30 minutes including review of chart and various tests results, discussions about plan of care and coordination of care plan .   Disclaimer: This note was dictated with voice recognition software. Similar sounding words can inadvertently be transcribed and may not be corrected upon review.

## 2023-10-25 ENCOUNTER — Other Ambulatory Visit: Payer: Self-pay | Admitting: Adult Health

## 2023-10-25 DIAGNOSIS — F411 Generalized anxiety disorder: Secondary | ICD-10-CM

## 2023-10-26 ENCOUNTER — Telehealth: Payer: Self-pay | Admitting: Internal Medicine

## 2023-10-26 NOTE — Telephone Encounter (Signed)
Scheduled appointments with the patient

## 2023-10-31 ENCOUNTER — Encounter: Payer: Self-pay | Admitting: Gastroenterology

## 2023-10-31 NOTE — Patient Instructions (Addendum)
 VISIT SUMMARY:  You had a follow-up appointment to check on your liver condition, specifically your decompensated cirrhosis. Your condition is stable, and you are managing well with your current medications. You have stopped consuming alcohol, which is beneficial for your liver health.  YOUR PLAN:  DECOMPENSATED CIRRHOSIS: Your liver condition is being well-managed with your current medications, and you have no recent complications. -Continue taking your current medications for  hepatic encephalopathy and esophageal varices. -Avoid alcohol to prevent further liver damage. -Schedule your next blood work in January. -Plan a follow-up appointment in six months unless your condition changes.  LABORATORY MONITORING: Regular blood work is important to monitor your liver function. -Your last tests in July were stable. Your next blood work is scheduled for October, and you will continue to have blood work every three months.

## 2023-11-01 DIAGNOSIS — Z0100 Encounter for examination of eyes and vision without abnormal findings: Secondary | ICD-10-CM | POA: Diagnosis not present

## 2023-11-02 ENCOUNTER — Other Ambulatory Visit: Payer: Self-pay | Admitting: Family Medicine

## 2023-11-21 ENCOUNTER — Other Ambulatory Visit: Payer: Self-pay | Admitting: Adult Health

## 2023-11-21 DIAGNOSIS — F411 Generalized anxiety disorder: Secondary | ICD-10-CM

## 2023-11-23 ENCOUNTER — Telehealth: Payer: Self-pay | Admitting: *Deleted

## 2023-11-23 NOTE — Telephone Encounter (Signed)
 Copied from CRM [id number]. Topic: Clinical - Request for Lab/Test Order >> Nov 23, 2023 12:31 PM Christine Barton wrote: Reason for CRM: Pt stated that she would like to have her labs done before her physical appt on 9/23. I informed the pt that the labs would have to ordered before scheduling them. Pt would like to have the labs ordered and receive a callback to schedule the appt.

## 2023-11-23 NOTE — Telephone Encounter (Signed)
 Please schedule labs prior to CPE (fasting). PCP will place orders closer to appt date

## 2023-11-25 ENCOUNTER — Encounter: Payer: Self-pay | Admitting: Adult Health

## 2023-11-25 ENCOUNTER — Telehealth: Payer: Self-pay | Admitting: Cardiology

## 2023-11-25 ENCOUNTER — Telehealth: Admitting: Adult Health

## 2023-11-25 DIAGNOSIS — G4700 Insomnia, unspecified: Secondary | ICD-10-CM | POA: Diagnosis not present

## 2023-11-25 DIAGNOSIS — G4701 Insomnia due to medical condition: Secondary | ICD-10-CM

## 2023-11-25 DIAGNOSIS — F411 Generalized anxiety disorder: Secondary | ICD-10-CM

## 2023-11-25 NOTE — Telephone Encounter (Signed)
 Spoke with pt regarding back pain. Pt stated that she has been having continuous back pain for several months that sometimes goes away when she takes tylenol . Pt stated she has also been having pain under her left breast that comes and goes. Pt stated she has had no shortness of breath. Pt stated she is not currently having back pain she said because she took tylenol  this afternoon. The pt did not have any blood pressure readings to share. Pt was told that if she begins to have chest pain that does not go away or that radiates that she should go to the ED. The pt adamantly refused. The pt was made an appointment on 9/23.

## 2023-11-25 NOTE — Progress Notes (Signed)
 Christine Barton [id number] November 22, 1950 73 y.o.  Virtual Visit via Video Note  I connected with pt @ on 11/25/23 at  1:00 PM EDT by a video enabled telemedicine application and verified that I am speaking with the correct person using two identifiers.   I discussed the limitations of evaluation and management by telemedicine and the availability of in person appointments. The patient expressed understanding and agreed to proceed.  I discussed the assessment and treatment plan with the patient. The patient was provided an opportunity to ask questions and all were answered. The patient agreed with the plan and demonstrated an understanding of the instructions.   The patient was advised to call back or seek an in-person evaluation if the symptoms worsen or if the condition fails to improve as anticipated.  I provided 20 minutes of non-face-to-face time during this encounter.  The patient was located at home.  The provider was located at [facility].   Angeline LOISE Sayers, NP   Subjective:   Patient ID:  Christine Barton is a 73 y.o. (DOB 07-Oct-1950) female.  Chief Complaint: No chief complaint on file.   HPI Christine Barton presents for follow-up of GAD and insomnia.   Describes mood today as ok. Pleasant. Reports tearfulness. Mood symptoms - reports decreased depression, anxiety and irritability. Reports improved interest and motivation - I'm getting there. Reports panic attacks occasionally. Reports decreased worry, rumination and over thinking. Reports mood has improved. Stating I feel like I'm doing real good. Feels like current medications are helpful Pristiq  100mg . Taking medications as prescribed. Energy levels improved. Active, does not have a regular exercise routine.  Enjoys some usual interests and activities. Lives alone with 5 cats. Lives next to daughter. Spending time with family. Appetite decreased - a little better, trying to eat more. Weight stable - 112  pounds. Sleeps well most nights. Averages 7 to 8 hours. Focus and concentration stable. Completing tasks. Managing aspects of household. Retired from [employer]. Denies SI or HI.  Denies AH or VH. Denies self harm. Denies substance use. Has seen a therapist previously.  Review of Systems:  Review of Systems  Musculoskeletal:  Negative for gait problem.  Neurological:  Negative for tremors.  Psychiatric/Behavioral:         Please refer to HPI    Medications: I have reviewed the patient's current medications.  Current Outpatient Medications  Medication Sig Dispense Refill   acetaminophen  (TYLENOL ) 500 MG tablet Take 500 mg by mouth every 6 (six) hours as needed for moderate pain.     albuterol  (VENTOLIN  HFA) 108 (90 Base) MCG/ACT inhaler INHALE 2 PUFFS INTO THE LUNGS EVERY 4 HOURS AS NEEDED FOR WHEEZE OR FOR SHORTNESS OF BREATH 8.5 each 2   cholecalciferol (VITAMIN D3) 25 MCG (1000 UNIT) tablet Take 1,000 Units by mouth daily.     clorazepate  (TRANXENE ) 3.75 MG tablet Take 1 tablet (3.75 mg total) by mouth 3 (three) times daily as needed for anxiety. 90 tablet 2   cyclobenzaprine  (FLEXERIL ) 10 MG tablet TAKE 1/2 TABLET BY MOUTH AT BEDTIME AS NEEDED FOR MUSCLE SPASMS 15 tablet 3   desvenlafaxine  (PRISTIQ ) 100 MG 24 hr tablet TAKE 1 TABLET BY MOUTH EVERY DAY 30 tablet 0   doxycycline  (VIBRA -TABS) 100 MG tablet Take 1 tablet (100 mg total) by mouth 2 (two) times daily. 20 tablet 0   lactulose  (CONSTULOSE ) 10 GM/15ML solution Take 15 mLs (10 g total) by mouth 2 (two) times daily as needed for severe constipation.  473 mL 1   Multiple Vitamin (MULTIVITAMIN) tablet Take 1 tablet by mouth daily.     Multiple Vitamins-Minerals (PRESERVISION AREDS 2) CAPS Take 1 capsule by mouth 2 (two) times daily.     mupirocin  ointment (BACTROBAN ) 2 % APPLY 1 APPLICATION TOPICALLY 2 TIMES DAILY AS NEEDED. 15 g 3   nadolol  (CORGARD ) 20 MG tablet Take 1 tablet (20 mg total) by mouth daily. 90 tablet 3    nystatin  (MYCOSTATIN ) 100000 UNIT/ML suspension TAKE 5 MLS (500,000 UNITS TOTAL) BY MOUTH 3 (THREE) TIMES DAILY. SWISH AND SWALLOW 120 mL 0   pantoprazole  (PROTONIX ) 40 MG tablet TAKE 1 TABLET (40 MG TOTAL) BY MOUTH TWICE A DAY BEFORE MEALS 180 tablet 1   rifaximin  (XIFAXAN ) 550 MG TABS tablet Take 1 tablet (550 mg total) by mouth 2 (two) times daily. 180 tablet 3   SYSTANE COMPLETE 0.6 % SOLN Apply 1 drop to eye 2 (two) times daily.     Tiotropium Bromide Monohydrate  (SPIRIVA  RESPIMAT) 2.5 MCG/ACT AERS INHALE 2 PUFFS BY MOUTH INTO THE LUNGS DAILY 4 g 11   ursodiol  (ACTIGALL ) 300 MG capsule TAKE 1 CAPSULE BY MOUTH EVERY DAY 90 capsule 3   No current facility-administered medications for this visit.    Medication Side Effects: None  Allergies:  Allergies  Allergen Reactions   Anoro Ellipta  [Umeclidinium-Vilanterol] Cough   Bevespi  Aerosphere [Glycopyrrolate-Formoterol ] Cough    Prolonged coughing episode with first dose   Oxycodone  Nausea And Vomiting   Crestor  [Rosuvastatin ]     Abdominal pain     Glipizide  Other (See Comments)    Stomach pain   Losartan      Pt thinks it caused urinary retention     Spironolactone      Urinary retention      Past Medical History:  Diagnosis Date   Alcohol abuse, unspecified    Breast cancer (HCC) 1998   Right   Cataract    left eye   Cervical spondylosis 2006   MRI   Degeneration of cervical intervertebral disc 2006   MRI   Diabetes mellitus without complication (HCC)    Diverticulosis of colon (without mention of hemorrhage)    Hyperpotassemia    Lung cancer (HCC) 03/2021   started radiation in January 2023, right side   Microscopic hematuria    Mononeuritis of unspecified site    Nonspecific abnormal results of liver function study    Other abnormal glucose    Other and unspecified hyperlipidemia    no per pt   Other chronic nonalcoholic liver disease    Personal history of chemotherapy    Personal history of malignant  neoplasm of breast    Personal history of radiation therapy    Pneumothorax, acute    right, spontaneous   Tobacco use disorder    Unspecified vitamin D  deficiency     Family History  Problem Relation Age of Onset   Heart failure Father    Heart attack Father    Colon cancer Maternal Uncle    Stroke Mother    Esophageal cancer Neg Hx    Rectal cancer Neg Hx    Stomach cancer Neg Hx    Pancreatic cancer Neg Hx     Social History   Socioeconomic History   Marital status: Single    Spouse name: Not on file   Number of children: 1   Years of education: Not on file   Highest education level: Not on file  Occupational History   Occupation: retired  Employer: [employer]  Tobacco Use   Smoking status: Every Day    Current packs/day: 1.00    Average packs/day: 1 pack/day for 59.1 years (59.1 ttl pk-yrs)    Types: Cigarettes    Start date: 09/25/2021    Passive exposure: Past   Smokeless tobacco: Never   Tobacco comments:    Started smoking at 73 years old.     1PPD 12/20/2022 khj  Vaping Use   Vaping status: Never Used  Substance and Sexual Activity   Alcohol use: Not Currently   Drug use: No   Sexual activity: Not Currently  Other Topics Concern   Not on file  Social History Narrative   Divorced      1 child      Works at [employer]         Social Drivers of [employer]: Low Risk  (04/01/2023)   Overall Financial Resource Strain (CARDIA)    Difficulty of Paying Living Expenses: Not hard at all  Food Insecurity: No Food Insecurity (04/01/2023)   Hunger Vital Sign    Worried About Running Out of Food in the Last Year: Never true    Ran Out of Food in the Last Year: Never true  Recent Concern: Food Insecurity - Food Insecurity Present (03/03/2023)   Hunger Vital Sign    Worried About Running Out of Food in the Last Year: Never true    Ran Out of Food in the Last Year: Sometimes true  Transportation Needs: No Transportation  Needs (04/01/2023)   PRAPARE - Administrator, Civil Service (Medical): No    Lack of Transportation (Non-Medical): No  Physical Activity: Inactive (04/01/2023)   Exercise Vital Sign    Days of Exercise per Week: 0 days    Minutes of Exercise per Session: 0 min  Stress: No Stress Concern Present (04/01/2023)   [employer] of Occupational Health - Occupational Stress Questionnaire    Feeling of Stress : Not at all  Social Connections: Unknown (04/01/2023)   Social Connection and Isolation Panel    Frequency of Communication with Friends and Family: Never    Frequency of Social Gatherings with Friends and Family: Never    Attends Religious Services: Never    Database administrator or Organizations: No    Attends Banker Meetings: Never    Marital Status: Not on file  Intimate Partner Violence: Not At Risk (04/01/2023)   Humiliation, Afraid, Rape, and Kick questionnaire    Fear of Current or Ex-Partner: No    Emotionally Abused: No    Physically Abused: No    Sexually Abused: No    Past Medical History, Surgical history, Social history, and Family history were reviewed and updated as appropriate.   Please see review of systems for further details on the patient's review from today.   Objective:   Physical Exam:  LMP  (LMP Unknown)   Physical Exam Constitutional:      General: She is not in acute distress. Musculoskeletal:        General: No deformity.  Neurological:     Mental Status: She is alert and oriented to person, place, and time.     Coordination: Coordination normal.  Psychiatric:        Attention and Perception: Attention and perception normal. She does not perceive auditory or visual hallucinations.        Mood and Affect: Mood normal. Mood is not anxious or depressed.  Affect is not labile, blunt, angry or inappropriate.        Speech: Speech normal.        Behavior: Behavior normal.        Thought Content: Thought content normal.  Thought content is not paranoid or delusional. Thought content does not include homicidal or suicidal ideation. Thought content does not include homicidal or suicidal plan.        Cognition and Memory: Cognition and memory normal.        Judgment: Judgment normal.     Comments: Insight intact     Lab Review:     Component Value Date/Time   NA 133 (L) 10/17/2023 1311   NA 132 (L) 05/26/2021 1147   K 4.4 10/17/2023 1311   CL 97 (L) 10/17/2023 1311   CO2 30 10/17/2023 1311   GLUCOSE 152 (H) 10/17/2023 1311   BUN 13 10/17/2023 1311   BUN 21 05/26/2021 1147   CREATININE 0.77 10/17/2023 1311   CREATININE 0.65 07/22/2023 1508   CALCIUM  10.2 10/17/2023 1311   PROT 7.2 10/17/2023 1311   PROT 7.1 05/26/2021 1147   ALBUMIN  4.2 10/17/2023 1311   ALBUMIN  4.4 05/26/2021 1147   AST 18 10/17/2023 1311   ALT 8 10/17/2023 1311   ALKPHOS 78 10/17/2023 1311   BILITOT 0.5 10/17/2023 1311   GFRNONAA >60 10/17/2023 1311   GFRAA 83 12/11/2019 1617       Component Value Date/Time   WBC 7.3 10/17/2023 1311   WBC 6.4 12/16/2022 0939   RBC 4.30 10/17/2023 1311   HGB 14.2 10/17/2023 1311   HGB 12.4 12/11/2019 1617   HCT 41.1 10/17/2023 1311   HCT 39.3 12/11/2019 1617   PLT 174 10/17/2023 1311   PLT 120 (L) 12/11/2019 1617   MCV 95.6 10/17/2023 1311   MCV 86 12/11/2019 1617   MCH 33.0 10/17/2023 1311   MCHC 34.5 10/17/2023 1311   RDW 13.8 10/17/2023 1311   RDW 17.0 (H) 12/11/2019 1617   LYMPHSABS 1.3 10/17/2023 1311   MONOABS 0.5 10/17/2023 1311   EOSABS 0.1 10/17/2023 1311   BASOSABS 0.1 10/17/2023 1311    No results found for: POCLITH, LITHIUM   No results found for: PHENYTOIN, PHENOBARB, VALPROATE, CBMZ   .res Assessment: Plan:    Plan:  PDMP reviewed  1. Tranxene  3.75 TID prn anxiety 2. Pristiq  100mg  daily  RTC 3 months  20 minutes spent dedicated to the care of this patient on the date of this encounter to include pre-visit review of records, ordering of  medication, post visit documentation, and face-to-face time with the patient discussing GAD and insomnia. Discussed continuing current medication regimen.  Patient advised to contact office with any questions, adverse effects, or acute worsening in signs and symptoms.  Discussed potential benefits, risk, and side effects of benzodiazepines to include potential risk of tolerance and dependence, as well as possible drowsiness. Advised patient not to drive if experiencing drowsiness and to take lowest possible effective dose to minimize risk of dependence and tolerance.   There are no diagnoses linked to this encounter.   Please see After Visit Summary for patient specific instructions.  Future Appointments  Date Time Provider Department Center  12/27/2023  9:30 AM [facility] LAB [facility]  01/03/2024 11:30 AM Tower, Laine LABOR, MD [facility]  04/03/2024  2:20 PM [facility] ANNUAL WELLNESS VISIT 1 [facility]  10/15/2024 12:15 PM [facility] None  10/22/2024  3:15 PM Sherrod Sherrod, MD [facility] None  No orders of the defined types were placed in this encounter.     -------------------------------

## 2023-11-25 NOTE — Telephone Encounter (Signed)
 Pt c/o of Chest Pain: STAT if active (IN THIS MOMENT) CP, including tightness, pressure, jaw pain, shoulder/upper arm/back pain, SOB, nausea, and vomiting.  1. Are you having CP right now (tightness, pressure, or discomfort)?  No   2. Are you experiencing any other symptoms (ex. SOB, nausea, vomiting, sweating)?  Back pain. Patient says this may be due to unrelated  3. How long have you been experiencing CP? A couple of months, maybe longer  4. Is your CP continuous or coming and going?  Coming and going   5. Have you taken Nitroglycerin ?  No, but she says she takes Tylenol  to relieve pain.

## 2023-11-27 NOTE — Telephone Encounter (Signed)
 Agree with plan

## 2023-12-07 ENCOUNTER — Encounter (HOSPITAL_COMMUNITY): Payer: Self-pay | Admitting: Emergency Medicine

## 2023-12-07 ENCOUNTER — Ambulatory Visit: Attending: Cardiology | Admitting: Cardiology

## 2023-12-07 ENCOUNTER — Emergency Department (HOSPITAL_COMMUNITY)

## 2023-12-07 ENCOUNTER — Emergency Department (HOSPITAL_COMMUNITY)
Admission: EM | Admit: 2023-12-07 | Discharge: 2023-12-07 | Discharge status: Against Medical Advice | Attending: Emergency Medicine | Admitting: Emergency Medicine

## 2023-12-07 VITALS — BP 125/80 | HR 90 | Ht 64.0 in | Wt 108.0 lb

## 2023-12-07 DIAGNOSIS — R072 Precordial pain: Secondary | ICD-10-CM

## 2023-12-07 DIAGNOSIS — M546 Pain in thoracic spine: Secondary | ICD-10-CM | POA: Diagnosis not present

## 2023-12-07 DIAGNOSIS — Z720 Tobacco use: Secondary | ICD-10-CM | POA: Diagnosis not present

## 2023-12-07 DIAGNOSIS — R0609 Other forms of dyspnea: Secondary | ICD-10-CM

## 2023-12-07 DIAGNOSIS — R0789 Other chest pain: Secondary | ICD-10-CM | POA: Insufficient documentation

## 2023-12-07 DIAGNOSIS — R0602 Shortness of breath: Secondary | ICD-10-CM | POA: Diagnosis not present

## 2023-12-07 DIAGNOSIS — F17200 Nicotine dependence, unspecified, uncomplicated: Secondary | ICD-10-CM | POA: Insufficient documentation

## 2023-12-07 DIAGNOSIS — R079 Chest pain, unspecified: Secondary | ICD-10-CM

## 2023-12-07 DIAGNOSIS — R062 Wheezing: Secondary | ICD-10-CM | POA: Insufficient documentation

## 2023-12-07 DIAGNOSIS — I2510 Atherosclerotic heart disease of native coronary artery without angina pectoris: Secondary | ICD-10-CM

## 2023-12-07 DIAGNOSIS — Z5321 Procedure and treatment not carried out due to patient leaving prior to being seen by health care provider: Secondary | ICD-10-CM | POA: Diagnosis not present

## 2023-12-07 LAB — TROPONIN I (HIGH SENSITIVITY)
Troponin I (High Sensitivity): 7 ng/L (ref <18)
Troponin I (High Sensitivity): 6 ng/L (ref <18)

## 2023-12-07 LAB — CBC
HCT: 47.2 % — ABNORMAL HIGH (ref 36.0–46.0)
Hemoglobin: 16.1 g/dL — ABNORMAL HIGH (ref 12.0–15.0)
MCH: 33.3 pg (ref 26.0–34.0)
MCHC: 34.1 g/dL (ref 30.0–36.0)
MCV: 97.7 fL (ref 80.0–100.0)
Platelets: 222 K/uL (ref 150–400)
RBC: 4.83 MIL/uL (ref 3.87–5.11)
RDW: 13.7 % (ref 11.5–15.5)
WBC: 10 K/uL (ref 4.0–10.5)
nRBC: 0 % (ref 0.0–0.2)

## 2023-12-07 LAB — BASIC METABOLIC PANEL WITH GFR
Anion gap: 12 (ref 5–15)
BUN: 13 mg/dL (ref 8–23)
CO2: 26 mmol/L (ref 22–32)
Calcium: 10.4 mg/dL — ABNORMAL HIGH (ref 8.9–10.3)
Chloride: 95 mmol/L — ABNORMAL LOW (ref 98–111)
Creatinine, Ser: 0.76 mg/dL (ref 0.44–1.00)
GFR, Estimated: >60 mL/min (ref >60)
Glucose, Bld: 170 mg/dL — ABNORMAL HIGH (ref 70–99)
Potassium: 4.6 mmol/L (ref 3.5–5.1)
Sodium: 133 mmol/L — ABNORMAL LOW (ref 135–145)

## 2023-12-07 NOTE — ED Notes (Signed)
 Pt states that she has to leave due to not being able to drive in the dark and does not have a ride. Pt reports she will follow up with cardiologist in the morning.

## 2023-12-07 NOTE — Patient Instructions (Signed)
 SENT TO EMERGENCY ROOM  Follow-Up: At [facility], you and your health needs are our priority.  As part of our continuing mission to provide you with exceptional heart care, our providers are all part of one team.  This team includes your primary Cardiologist (physician) and Advanced Practice Providers or APPs (Physician Assistants and Nurse Practitioners) who all work together to provide you with the care you need, when you need it.  Your next appointment:   1 month(s)  Provider:   Lonni LITTIE Nanas, MD    We recommend signing up for the patient portal called MyChart.  Sign up information is provided on this After Visit Summary.  MyChart is used to connect with patients for Virtual Visits (Telemedicine).  Patients are able to view lab/test results, encounter notes, upcoming appointments, etc.  Non-urgent messages can be sent to your provider as well.   To learn more about what you can do with MyChart, go to [email or web address].

## 2023-12-07 NOTE — ED Provider Triage Note (Signed)
 Emergency Medicine Provider Triage Evaluation Note  Christine Barton , a 73 y.o. female  was evaluated in triage.  Pt complains of chest pain that is primarily left-sided has been going on for a few months but worsening.  Patient states that it is slightly relieved when taking Tylenol .  Smoking history.  Denies fever, chills, new cough.  Review of Systems  Positive: Chest pain Negative: Fever, chills, cough  Physical Exam  BP (!) 127/90 (BP Location: Right Arm)   Pulse 90   Temp 97.7 F (36.5 C)   Resp 19   Ht 5' 4 (1.626 m)   Wt 48 kg   LMP  (LMP Unknown)   SpO2 96%   BMI 18.16 kg/m  Gen:   Awake, no distress   Resp:  Normal effort  MSK:   Moves extremities without difficulty  Other:    Medical Decision Making  Medically screening exam initiated at 4:02 PM.  Appropriate orders placed.  Christine Barton was informed that the remainder of the evaluation will be completed by another provider, this initial triage assessment does not replace that evaluation, and the importance of remaining in the ED until their evaluation is complete.  Orders: CBC, BMP, troponin, chest x-ray, EKG   Christine Barton, [place] 12/07/23 1605

## 2023-12-07 NOTE — ED Triage Notes (Signed)
 Pt reports pain under left breast for awhile. Pt has expiratory wheezing but reports it is her normal due to smoking for 60 years. Also has pain in mid back.

## 2023-12-07 NOTE — ED Triage Notes (Signed)
 Sent by Dr office to rule out MI. Reports having worsening back pain and chest pain with sob.

## 2023-12-07 NOTE — Progress Notes (Signed)
 Cardiology Office Note:    Date:  12/07/2023   ID:  Christine Barton, DOB [birth year], MRN [id number]  PCP:  Randeen Laine LABOR, MD  Cardiologist:  Lonni LITTIE Nanas, MD  Electrophysiologist:  None   Referring MD: Randeen Laine LABOR, MD   Chief Complaint  Patient presents with   Chest Pain    History of Present Illness:    Christine Barton is a 73 y.o. female with a hx of cirrhosis c/b esophageal varices, tobacco use, hyperlipidemia, type 2 diabetes who presents for follow-up.  She was referred by Dr. Randeen for evaluation of coronary artery calcifications seen on chest CT, with initial appointment on 02/13/2019.  She underwent a chest CT for lung cancer screening on 01/18/2019, which showed multivessel coronary artery calcifications.  Patient denies any chest pain.  States that her activity has been limited by back pain but has been doing physical therapy.  She does report intermittent dyspnea with exertion, which she states improves with inhaler use.  She continues to smoke 1 pack/day.  Had been taken off diuretics but had to restart.  TTE on 02/21/2019 showed normal LV systolic function, normal RV function, no significant valvular disease.  Lexiscan  Myoview  on 02/15/2020 showed EF 55%, normal perfusion.  Continued to have chest pain and coronary CTA was done on 06/08/2021, which showed calcium  score 598 (94th percentile), diffuse calcified plaque causing mild stenosis, moderate stenosis in diagonal branch; CT FFR showed no significant stenosis.  Since her last clinic visit, she reports she has been having significant back and chest pain recently.  She reports she is currently having chest pain, describes as dull aching pain 2-3 out of 10 in intensity on left side of chest.  Also with significant pain in her back.  States that she feels short of breath.  Past Medical History:  Diagnosis Date   Alcohol abuse, unspecified    Breast cancer (HCC) 1998   Right   Cataract    left eye   Cervical  spondylosis 2006   MRI   Degeneration of cervical intervertebral disc 2006   MRI   Diabetes mellitus without complication (HCC)    Diverticulosis of colon (without mention of hemorrhage)    Hyperpotassemia    Lung cancer (HCC) 03/2021   started radiation in January 2023, right side   Microscopic hematuria    Mononeuritis of unspecified site    Nonspecific abnormal results of liver function study    Other abnormal glucose    Other and unspecified hyperlipidemia    no per pt   Other chronic nonalcoholic liver disease    Personal history of chemotherapy    Personal history of malignant neoplasm of breast    Personal history of radiation therapy    Pneumothorax, acute    right, spontaneous   Tobacco use disorder    Unspecified vitamin D  deficiency     Past Surgical History:  Procedure Laterality Date   BIOPSY  07/27/2019   Procedure: BIOPSY;  Surgeon: Leigh Elspeth SQUIBB, MD;  Location: [facility];  Service: Gastroenterology;;   BREAST BIOPSY  9/03   Right   BREAST LUMPECTOMY Right 1998   CHEST TUBE INSERTION  11/02/2014   COLONOSCOPY     COLONOSCOPY WITH PROPOFOL  N/A 07/27/2019   Procedure: COLONOSCOPY WITH PROPOFOL ;  Surgeon: Leigh Elspeth SQUIBB, MD;  Location: [facility];  Service: Gastroenterology;  Laterality: N/A;   COLONOSCOPY WITH PROPOFOL  N/A 07/29/2022   Procedure: COLONOSCOPY WITH PROPOFOL ;  Surgeon: Shila Gustav GAILS, MD;  Location: [facility];  Service: Gastroenterology;  Laterality: N/A;   ESOPHAGOGASTRODUODENOSCOPY (EGD) WITH PROPOFOL  N/A 10/30/2018   Procedure: ESOPHAGOGASTRODUODENOSCOPY (EGD) WITH PROPOFOL ;  Surgeon: Shila Gustav GAILS, MD;  Location: [facility];  Service: Endoscopy;  Laterality: N/A;   ESOPHAGOGASTRODUODENOSCOPY (EGD) WITH PROPOFOL  N/A 03/12/2019   Procedure: ESOPHAGOGASTRODUODENOSCOPY (EGD) WITH PROPOFOL ;  Surgeon: Shila Gustav GAILS, MD;  Location: [facility];  Service: Endoscopy;  Laterality: N/A;   ESOPHAGOGASTRODUODENOSCOPY  (EGD) WITH PROPOFOL  N/A 07/27/2019   Procedure: ESOPHAGOGASTRODUODENOSCOPY (EGD) WITH PROPOFOL ;  Surgeon: Leigh Elspeth SQUIBB, MD;  Location: [facility];  Service: Gastroenterology;  Laterality: N/A;   EYE SURGERY  02/2017   cataract extraction with lens implant-left   HEMOSTASIS CLIP PLACEMENT  07/29/2022   Procedure: HEMOSTASIS CLIP PLACEMENT;  Surgeon: Shila Gustav GAILS, MD;  Location: [facility];  Service: Gastroenterology;;   HOT HEMOSTASIS N/A 07/27/2019   Procedure: HOT HEMOSTASIS (ARGON PLASMA COAGULATION/BICAP);  Surgeon: Leigh Elspeth SQUIBB, MD;  Location: [facility];  Service: Gastroenterology;  Laterality: N/A;   MOUTH SURGERY     POLYPECTOMY  07/27/2019   Procedure: POLYPECTOMY;  Surgeon: Leigh Elspeth SQUIBB, MD;  Location: [facility];  Service: Gastroenterology;;   POLYPECTOMY  07/29/2022   Procedure: POLYPECTOMY;  Surgeon: Shila Gustav GAILS, MD;  Location: [facility];  Service: Gastroenterology;;   TUBAL LIGATION      Current Medications: Current Meds  Medication Sig   acetaminophen  (TYLENOL ) 500 MG tablet Take 500 mg by mouth every 6 (six) hours as needed for moderate pain.   albuterol  (VENTOLIN  HFA) 108 (90 Base) MCG/ACT inhaler INHALE 2 PUFFS INTO THE LUNGS EVERY 4 HOURS AS NEEDED FOR WHEEZE OR FOR SHORTNESS OF BREATH   cholecalciferol (VITAMIN D3) 25 MCG (1000 UNIT) tablet Take 1,000 Units by mouth daily.   clorazepate  (TRANXENE ) 3.75 MG tablet Take 1 tablet (3.75 mg total) by mouth 3 (three) times daily as needed for anxiety.   cyclobenzaprine  (FLEXERIL ) 10 MG tablet TAKE 1/2 TABLET BY MOUTH AT BEDTIME AS NEEDED FOR MUSCLE SPASMS   desvenlafaxine  (PRISTIQ ) 100 MG 24 hr tablet TAKE 1 TABLET BY MOUTH EVERY DAY   lactulose  (CONSTULOSE ) 10 GM/15ML solution Take 15 mLs (10 g total) by mouth 2 (two) times daily as needed for severe constipation.   Multiple Vitamin (MULTIVITAMIN) tablet Take 1 tablet by mouth daily.   Multiple Vitamins-Minerals (PRESERVISION AREDS  2) CAPS Take 1 capsule by mouth 2 (two) times daily.   mupirocin  ointment (BACTROBAN ) 2 % APPLY 1 APPLICATION TOPICALLY 2 TIMES DAILY AS NEEDED.   nadolol  (CORGARD ) 20 MG tablet Take 1 tablet (20 mg total) by mouth daily.   nystatin  (MYCOSTATIN ) 100000 UNIT/ML suspension TAKE 5 MLS (500,000 UNITS TOTAL) BY MOUTH 3 (THREE) TIMES DAILY. SWISH AND SWALLOW   pantoprazole  (PROTONIX ) 40 MG tablet TAKE 1 TABLET (40 MG TOTAL) BY MOUTH TWICE A DAY BEFORE MEALS   rifaximin  (XIFAXAN ) 550 MG TABS tablet Take 1 tablet (550 mg total) by mouth 2 (two) times daily.   SYSTANE COMPLETE 0.6 % SOLN Apply 1 drop to eye 2 (two) times daily.   Tiotropium Bromide Monohydrate  (SPIRIVA  RESPIMAT) 2.5 MCG/ACT AERS INHALE 2 PUFFS BY MOUTH INTO THE LUNGS DAILY   ursodiol  (ACTIGALL ) 300 MG capsule TAKE 1 CAPSULE BY MOUTH EVERY DAY     Allergies:   Anoro ellipta  [umeclidinium-vilanterol], Bevespi  aerosphere [glycopyrrolate-formoterol ], Oxycodone , Crestor  [rosuvastatin ], Glipizide , Losartan , and Spironolactone    Social History   Socioeconomic History   Marital status: Single    Spouse name: Not on file  Number of children: 1   Years of education: Not on file   Highest education level: Not on file  Occupational History   Occupation: retired    Associate Professor: [employer]  Tobacco Use   Smoking status: Every Day    Current packs/day: 1.00    Average packs/day: 1 pack/day for 59.1 years (59.1 ttl pk-yrs)    Types: Cigarettes    Start date: 09/25/2021    Passive exposure: Past   Smokeless tobacco: Never   Tobacco comments:    Started smoking at 73 years old.     1PPD 12/20/2022 khj  Vaping Use   Vaping status: Never Used  Substance and Sexual Activity   Alcohol use: Not Currently   Drug use: No   Sexual activity: Not Currently  Other Topics Concern   Not on file  Social History Narrative   Divorced      1 child      Works at [employer]         Social Drivers of [employer]:  Low Risk  (04/01/2023)   Overall Financial Resource Strain (CARDIA)    Difficulty of Paying Living Expenses: Not hard at all  Food Insecurity: No Food Insecurity (04/01/2023)   Hunger Vital Sign    Worried About Running Out of Food in the Last Year: Never true    Ran Out of Food in the Last Year: Never true  Recent Concern: Food Insecurity - Food Insecurity Present (03/03/2023)   Hunger Vital Sign    Worried About Running Out of Food in the Last Year: Never true    Ran Out of Food in the Last Year: Sometimes true  Transportation Needs: No Transportation Needs (04/01/2023)   PRAPARE - Administrator, Civil Service (Medical): No    Lack of Transportation (Non-Medical): No  Physical Activity: Inactive (04/01/2023)   Exercise Vital Sign    Days of Exercise per Week: 0 days    Minutes of Exercise per Session: 0 min  Stress: No Stress Concern Present (04/01/2023)   [employer] of Occupational Health - Occupational Stress Questionnaire    Feeling of Stress : Not at all  Social Connections: Unknown (04/01/2023)   Social Connection and Isolation Panel    Frequency of Communication with Friends and Family: Never    Frequency of Social Gatherings with Friends and Family: Never    Attends Religious Services: Never    Database administrator or Organizations: No    Attends Engineer, structural: Never    Marital Status: Not on file     Family History: The patient's \ family history includes Colon cancer in her maternal uncle; Heart attack in her father; Heart failure in her father; Stroke in her mother. There is no history of Esophageal cancer, Rectal cancer, Stomach cancer, or Pancreatic cancer.  ROS:   Please see the history of present illness.    \All other systems reviewed and are negative.  EKGs/Labs/Other Studies Reviewed:    The following studies were reviewed today:   EKG:   05/26/2021: Normal sinus rhythm, rate 71, no ST abnormalities 12/07/2023: Normal  sinus rhythm, rate 88, no ST abnormalities  Recent Labs: 12/16/2022: TSH 3.45 10/17/2023: ALT 8; BUN 13; Creatinine 0.77; Hemoglobin 14.2; Platelet Count 174; Potassium 4.4; Sodium 133  Recent Lipid Panel    Component Value Date/Time   CHOL 189 12/16/2022 0939   TRIG 204.0 (H) 12/16/2022 0939   HDL 40.50 12/16/2022 0939  CHOLHDL 5 12/16/2022 0939   VLDL 40.8 (H) 12/16/2022 0939   LDLCALC 108 (H) 12/16/2022 0939   LDLDIRECT 83.0 10/31/2017 0838    Physical Exam:    VS:  BP 125/80   Pulse 90   Ht 5' 4 (1.626 m)   Wt 108 lb (49 kg)   LMP  (LMP Unknown)   SpO2 98%   BMI 18.54 kg/m     Wt Readings from Last 3 Encounters:  12/07/23 108 lb (49 kg)  10/24/23 112 lb (50.8 kg)  10/24/23 112 lb (50.8 kg)     GEN: Cachectic, in no acute distress HEENT: Normal NECK: No JVD CARDIAC: RRR, no murmurs RESPIRATORY:  Clear to auscultation without rales, wheezing or rhonchi  ABDOMEN: Soft, non-tender, non-distended MUSCULOSKELETAL:  No edema SKIN: Warm and dry NEUROLOGIC:  Alert and oriented x 3 PSYCHIATRIC:  Normal affect   ASSESSMENT:    1. Chest pain of uncertain etiology   2. Dyspnea on exertion   3. Precordial pain   4. Coronary artery disease involving native coronary artery of native heart, unspecified whether angina present   5. Tobacco use     PLAN:     Chest pain/back pain/dyspnea: She is reporting active chest pain in clinic today, along with shortness of breath and back pain.  EKG in clinic today unremarkable.  She does have known CAD, coronary CTA 05/2021 showed mild to moderate stenosis, calcium  score 598 (94th percentile).  Recommend evaluation in ED to rule out MI.  She is agreeable to going to ED.  Coronary artery disease: CT chest showed coronary calcifications.  Lexiscan  Myoview  on 02/15/2020 showed EF 55%, normal perfusion.  TTE on 02/21/2019 showed no structural heart disease.  Coronary CTA was done on 06/08/2021, which showed calcium  score 598 (94th  percentile), diffuse calcified plaque causing mild stenosis, moderate stenosis in diagonal branch; CT FFR showed no significant stenosis. -She appears compensated in regards to her cirrhosis.  Discussed with her hepatologist, Dr. Nandigam, and OK to start on statin.  Started on rosuvastatin  5 mg daily in 02/2019.  LDL 57 on 05/06/2020.  LFTs have been stable.  She stopped taking it because she thought statin was causing her abdominal pain.  Recommended restarting statin, patient declines, states that she will not take another statin.  Recommended referral to pharmacy lipid clinic to evaluate alternatives but did not f/u -Would not start on daily aspirin given issues with GI bleeding  Type 2 diabetes: Well-controlled, A1c is most recently 5.7% 07/2023  Tobacco use: Patient counseled on risks of tobacco use and cessation strongly encouraged.    Lung cancer: Found to have right upper lobe nodule that was positive on PET scan.  Not a surgical candidate.  Status post radiation in January 2023   RTC in 1 month  Medication Adjustments/Labs and Tests Ordered: Current medicines are reviewed at length with the patient today.  Concerns regarding medicines are outlined above.  Orders Placed This Encounter  Procedures   EKG 12-Lead   EKG 12-Lead   EKG 12-Lead   No orders of the defined types were placed in this encounter.   Patient Instructions  SENT TO EMERGENCY ROOM  Follow-Up: At [facility], you and your health needs are our priority.  As part of our continuing mission to provide you with exceptional heart care, our providers are all part of one team.  This team includes your primary Cardiologist (physician) and Advanced Practice Providers or APPs (Physician Assistants and Nurse Practitioners) who  all work together to provide you with the care you need, when you need it.  Your next appointment:   1 month(s)  Provider:   Lonni LITTIE Nanas, MD    We recommend signing up for the  patient portal called MyChart.  Sign up information is provided on this After Visit Summary.  MyChart is used to connect with patients for Virtual Visits (Telemedicine).  Patients are able to view lab/test results, encounter notes, upcoming appointments, etc.  Non-urgent messages can be sent to your provider as well.   To learn more about what you can do with MyChart, go to [email or web address].        Signed, Lonni LITTIE Nanas, MD  12/07/2023 2:53 PM    [facility]

## 2023-12-08 ENCOUNTER — Telehealth: Payer: Self-pay | Admitting: Cardiology

## 2023-12-08 ENCOUNTER — Ambulatory Visit: Payer: Self-pay

## 2023-12-08 NOTE — Telephone Encounter (Signed)
 Pt would like a c/b in regards to her hospital visit.

## 2023-12-08 NOTE — Telephone Encounter (Signed)
 Unable to leave voicemail. Voicemail full 8/28

## 2023-12-08 NOTE — Telephone Encounter (Signed)
 FYI Only or Action Required?: Action required by provider: update on patient condition and requesting tramadol  rx.  Patient was last seen in primary care on 10/11/2023 by Randeen Laine LABOR, MD.  Called Nurse Triage reporting Back Pain and Chest Pain.  Symptoms began yesterday.  Interventions attempted: OTC medications: tylenol  without relief and Other: went to ED but then eloped.  Symptoms are: unchanged.  Triage Disposition: Call PCP Now  Patient/caregiver understands and will follow disposition?: Unsure       Copied from CRM [id number]. Topic: Clinical - Red Word Triage >> Dec 08, 2023  4:04 PM Chiquita SQUIBB wrote: Red Word that prompted transfer to Nurse Triage: Patient is calling in with severe pain in her back, patient went to the ER yesterday but left because it was taking to long. Patient is also requesting pain medication. Reason for Disposition  Condition / symptoms WORSE  Answer Assessment - Initial Assessment Questions 1. MAIN CONCERN OR SYMPTOM:  What is your main concern right now? What question do you have? What's the main symptom you're worried about? (e.g., breathing difficulty, ankle swelling, weight gain.)     Pt calling in to request tramadol  from PCPCP 2. ONSET: When did the CP/back pain  start?     yesterday 3. BETTER-SAME-WORSE: Are you getting better, staying the same, or getting worse compared to the day you were discharged?     Same/worse 4. HOSPITALIZATION: How long were you hospitalized? (e.g., days)     Went to ED yesterday as advised by cardiology to r/o heart attack-- pt reports she eloped d/t not being able to drive at night 5. DISCHARGE DIAGNOSIS:  What problem or disease were you hospitalized for?     unknown 6. DISCHARGE DATE: What date were you discharged from the hospital?     N/a 7. DISCHARGE DOCTOR: Who is the main doctor taking care of you now?     See chart 8. DISCHARGE APPOINTMENT: Have you scheduled a follow-up discharge  appointment with your doctor?     no 9. DISCHARGE MEDICINES: Did the doctor (or NP/PA) who discharged you order any new medicines for you to use? If yes, have you filled the prescription and started taking the medicine?      N/a 10. PAIN: Is there any pain? If Yes, ask: How bad is it?  (Scale 0-10; or none, mild, moderate, severe)       Yes, worse in back 11. FEVER: Do you have a fever? If Yes, ask: What is it, how was it measured  and when did it start?       denies 12. OTHER SYMPTOMS: Do you have any other symptoms?       denies    Triager strongly advised pt to go back to the ED to finish workup as she left before ED MD could give her a plan of care/discharge. Pt then stated that she was getting another call and disconnected.  Protocols used: Post-Hospitalization Follow-up Call-A-AH

## 2023-12-08 NOTE — Telephone Encounter (Signed)
 She was send to ED directly from cardiology for both chest and back pain  I do agree with ER disposition , I am also out of the office currently

## 2023-12-09 ENCOUNTER — Telehealth: Payer: Self-pay | Admitting: Cardiology

## 2023-12-09 NOTE — Telephone Encounter (Signed)
 Pt called in asking to hold on for the nurse. Informed her they will call her back. She has an appt scheduled 10/1 at [facility].

## 2023-12-09 NOTE — Telephone Encounter (Signed)
 Pt would like a call regarding scheduling a f/u with Dr. Kate.

## 2023-12-09 NOTE — Telephone Encounter (Signed)
 I have a lot of openings on 10/1 at [facility], can we add her on then?

## 2023-12-09 NOTE — Telephone Encounter (Signed)
 Patient returning nurses call  She is due for a 17month f/u with Dr. Kate per Dr. Kate but there is no availability, ok to double book?

## 2023-12-09 NOTE — Telephone Encounter (Signed)
 Pt went to ER on 8/27 and would like a c/b regarding tests she had done as she had to leave the hospital before she could hear anything. She read MyChart message but could not understand it. Please advise.

## 2023-12-09 NOTE — Telephone Encounter (Signed)
 Spoke with pt and reviewed recent ED records.  She will follow up with PCP for further evaluation.  She was appreciative of the information and assistance.

## 2023-12-13 ENCOUNTER — Ambulatory Visit: Payer: Self-pay

## 2023-12-13 ENCOUNTER — Telehealth: Payer: Self-pay | Admitting: Gastroenterology

## 2023-12-13 NOTE — Telephone Encounter (Signed)
 Spoke with pt. She reports that starting last week she developed pain under her left breast that radiates into the side and the back at about the same height. Rates 7-8/10. The pain is not mostly in her back. Steady pain. Denies reflux and heartburn, take Pantoprazole  daily. Reports some constipation, states I went yesterday, but it felt like I had to push. Is taking Miralax and Lactulose  as needed. Pt was seen and cleared by cardiology last Friday. Questioned pt on if she has any history of kidney stones, denies history. Also denies burning with urination, lower abd pain, and fevers. Pt states, I was on a fluid pill for a while but my primary took me off because I kept feeling like I needed to pee and couldn't. Pt states she is in pain, she has used Tramadol  previously and would like to know if we can prescribe medication again. Pt does have appt with PCP tomorrow.

## 2023-12-13 NOTE — Telephone Encounter (Signed)
 Called pt and relayed provider message. Pt verbalized understanding. She understands to keep her f/u appt with PCP tomorrow.

## 2023-12-13 NOTE — Telephone Encounter (Addendum)
 FYI Only or Action Required?: FYI only for provider.  Patient was last seen in primary care on 10/11/2023 by Randeen Laine LABOR, MD.  Called Nurse Triage reporting Chest Pain.  Symptoms began a week ago.  Interventions attempted: OTC medications: Tylenol .  Symptoms are: gradually worsening.  Triage Disposition: See Physician Within 24 Hours  Patient/caregiver understands and will follow disposition?: Yes  Copied from CRM #[id number]. Topic: Appointments - Appointment Scheduling >> Dec 13, 2023  9:07 AM Kathrin PARAS wrote: Patient is in pain in chest, back and side. She didn't get any sleep last night and wants to see PCP Dr. Randeen but she doesn't have anything until Thursday and she can't wait that long  Patient is requesting something for pain, because she is hurting and cannot wait. Informed patient that it would be sent along with the note. Patient is requesting someone call her back regarding this.  Reason for Disposition  [1] Chest pain lasts > 5 minutes AND [2] occurred > 3 days ago (72 hours) AND [3] NO chest pain or cardiac symptoms now  Answer Assessment - Initial Assessment Questions 1. LOCATION: Where does it hurt?       Under the left breast  2. RADIATION: Does the pain go anywhere else? (e.g., into neck, jaw, arms, back)     Side and Back  3. ONSET: When did the chest pain begin? (Minutes, hours or days)      A week ago  4. PATTERN: Does the pain come and go, or has it been constant since it started?  Does it get worse with exertion?      Constant  6. SEVERITY: How bad is the pain?  (e.g., Scale 1-10; mild, moderate, or severe)      7. CARDIAC RISK FACTORS: Do you have any history of heart problems or risk factors for heart disease? (e.g., angina, prior heart attack; diabetes, high blood pressure, high cholesterol, smoker, or strong family history of heart disease)     Aortic Hardening  8. PULMONARY RISK FACTORS: Do you have any history of lung disease?   (e.g., blood clots in lung, asthma, emphysema, birth control pills)     COPD, Lung Cancer  9. CAUSE: What do you think is causing the chest pain?     Unsure  10. OTHER SYMPTOMS: Do you have any other symptoms? (e.g., dizziness, nausea, vomiting, sweating, fever, difficulty breathing, cough)       No other symptoms  11. PREGNANCY: Is there any chance you are pregnant? When was your last menstrual period?       No and No  Protocols used: Chest Pain-A-AH

## 2023-12-13 NOTE — Telephone Encounter (Signed)
Patient scheduled appt for tomorrow.

## 2023-12-13 NOTE — Telephone Encounter (Signed)
 Pt notified of Dr. Graham comments. ER precautions still recommended. Pt said she's been in pain the long and one more day isn't going to make a difference. Pt will keep appt tomorrow

## 2023-12-13 NOTE — Telephone Encounter (Signed)
 Inbound call from patient experiencing pain under left breast pain. She is requesting a f/u call to speak with a nurse. States she has been in pain all weekend. And she also had an hospital visit last Wednesday. Please advise.   Thank you

## 2023-12-13 NOTE — Telephone Encounter (Signed)
 Called patient and patient verbalized she does not need follow up visit with Dr. Kate. Made patient aware to call office if appt is needed

## 2023-12-13 NOTE — Telephone Encounter (Signed)
 She was sent to ED for this by her cardiologist on 8/27 Sounds like she went but did not stay for eval I do not know what is causing this so cannot treat pain w/o eval  I do still agree with Er disposition but suspect pt declined

## 2023-12-14 ENCOUNTER — Ambulatory Visit (INDEPENDENT_AMBULATORY_CARE_PROVIDER_SITE_OTHER): Admitting: Family Medicine

## 2023-12-14 ENCOUNTER — Encounter: Payer: Self-pay | Admitting: Family Medicine

## 2023-12-14 ENCOUNTER — Ambulatory Visit: Admitting: Family Medicine

## 2023-12-14 VITALS — BP 122/60 | HR 100 | Temp 97.2°F | Ht 64.0 in | Wt 107.4 lb

## 2023-12-14 DIAGNOSIS — R109 Unspecified abdominal pain: Secondary | ICD-10-CM

## 2023-12-14 DIAGNOSIS — J432 Centrilobular emphysema: Secondary | ICD-10-CM

## 2023-12-14 DIAGNOSIS — R0789 Other chest pain: Secondary | ICD-10-CM

## 2023-12-14 DIAGNOSIS — E7800 Pure hypercholesterolemia, unspecified: Secondary | ICD-10-CM

## 2023-12-14 DIAGNOSIS — R7303 Prediabetes: Secondary | ICD-10-CM | POA: Diagnosis not present

## 2023-12-14 DIAGNOSIS — F17200 Nicotine dependence, unspecified, uncomplicated: Secondary | ICD-10-CM | POA: Diagnosis not present

## 2023-12-14 MED ORDER — TRAMADOL HCL 50 MG PO TABS: 50.0000 mg | ORAL_TABLET | Freq: Two times a day (BID) | ORAL | 0 refills | Status: AC | PRN

## 2023-12-14 NOTE — Assessment & Plan Note (Signed)
 Left flank/ abdomen and chest wall  See a/p for left abdominal pain

## 2023-12-14 NOTE — Assessment & Plan Note (Signed)
 Reviewed cardiology note (in setting of CAD) Was given ok per GI to start low dose statin due to stability of liver condition

## 2023-12-14 NOTE — Assessment & Plan Note (Signed)
 Pt notes wheezing and exertional shortness of breath is worsening lately  Plans to follow up with pulmonary Encouraged strongly to quit smoking

## 2023-12-14 NOTE — Assessment & Plan Note (Signed)
Continues to smoke.

## 2023-12-14 NOTE — Progress Notes (Signed)
 Subjective:    Patient ID: Christine Barton, female    DOB: 18-Jul-1950, 73 y.o.   MRN: [id number]  HPI  Wt Readings from Last 3 Encounters:  12/14/23 107 lb 6 oz (48.7 kg)  12/07/23 105 lb 13.1 oz (48 kg)  12/07/23 108 lb (49 kg)   18.43 kg/m  Vitals:   12/14/23 1502  BP: 122/60  Pulse: 100  Temp: (!) 97.2 F (36.2 C)  SpO2: 97%    Pt presents for c/o  Chest pain and back pain for about 2 months     She was sent to ER in midst of her cardiology visit on 8/27 EKG was unremarkable but it was still advised she go to ER to r/o MI  She went but did not stay for evaluation   Pain is under left breast Radiates to the side of ribs and back  Itches also (wanted to see dermatology)  Has not seen or felt a rash    Breast got really sensitive also - that is better now    Pain is dull and throbbing  Occational a sharp jab of pain  Occational sub sternal pain   More fatigue  More shortness of breath with exertion   Wheezes most of the time  Still smoking      History of aortic atherosclerosis  CAD with cardiac ca score 94%ile , cannot take asa due to past GI bleeding  Sees cardiology   Also liver cirrhosis with esophageal varicies -taking nadolol    History of lung cancer  (had chosen obs instead of treatment) after radiation in jan 2023  Current smoker  Copd   CT chest from 10/17/23 IMPRESSION: 1. Radiation changes in the right hemithorax, as above. 2. Stable underlying nodularity in the superior segment right lower lobe, non FDG-avid on prior PET, likely benign. 3. No evidence of recurrent or metastatic disease.   DG Chest 2 View Result Date: 12/07/2023 CLINICAL DATA:  Chest pain.  Shortness of breath. EXAM: CHEST - 2 VIEW COMPARISON:  CT scan chest from 10/17/2023. FINDINGS: There are heterogeneous calcific densities overlying the right upper lung zone, which corresponds to pleural calcification on the recent CT scan chest. No significant interval change  considering the difference in technique. There is additional horizontal heterogeneous opacity overlying the right upper mid lung zone junction region, which also appears essentially unchanged since the prior chest CT scan. Bilateral lungs are otherwise clear. No acute consolidation or lung collapse. Bilateral costophrenic angles are clear. Normal cardio-mediastinal silhouette. No acute osseous abnormalities. The soft tissues are within normal limits. IMPRESSION: No active cardiopulmonary disease. Electronically Signed   By: Ree Molt M.D.   On: 12/07/2023 16:23   Many years ago had minimal thoracic spine deg change on xray    Had MRI of abdomen on 05/10/23 Normal appeating adrenals/ urinary tract, stomach and bowel and LNs  Spleen and pancreas appeared normal   IMPRESSION: 1. Cirrhotic liver configuration with diffuse areas of confluent hepatic fibrosis. No focal liver lesion seen. 2. Small volume cholelithiasis without acute cholecystitis. 3. Mildly dilated extrahepatic bile duct however, it gradually tapers near the ampulla of Vater and there is no obstructing mass/choledocholithiasis. Findings are similar to the prior study and likely age related. Correlate clinically and with liver function test. 4. Multiple other nonacute observations, as described above.   Lab Results  Component Value Date   NA 133 (L) 12/07/2023   K 4.6 12/07/2023   CO2 26 12/07/2023   GLUCOSE  170 (H) 12/07/2023   BUN 13 12/07/2023   CREATININE 0.76 12/07/2023   CALCIUM  10.4 (H) 12/07/2023   GFR 78.15 08/09/2023   EGFR 93 07/22/2023   GFRNONAA >60 12/07/2023   Lab Results  Component Value Date   ALT 8 10/17/2023   AST 18 10/17/2023   ALKPHOS 78 10/17/2023   BILITOT 0.5 10/17/2023   Lab Results  Component Value Date   WBC 10.0 12/07/2023   HGB 16.1 (H) 12/07/2023   HCT 47.2 (H) 12/07/2023   MCV 97.7 12/07/2023   PLT 222 12/07/2023   Lab Results  Component Value Date   TSH 3.45 12/16/2022    Lab Results  Component Value Date   IRON  83 10/17/2023   TIBC 480 (H) 10/17/2023   FERRITIN 27 10/17/2023   Lab Results  Component Value Date   HGBA1C 5.7 (H) 07/22/2023   HGBA1C 5.8 12/16/2022   HGBA1C 6.6 (H) 12/21/2021    Lab Results  Component Value Date   MICROALBUR 9.6 (H) 07/19/2023   MICROALBUR 0.3 11/02/2019   Tried losartan  -thought it caused urinary retention    Patient Active Problem List   Diagnosis Date Noted   Left lateral abdominal pain 12/14/2023   Left flank pain 12/14/2023   Tick bite of left flank 10/11/2023   Depression with anxiety 07/22/2023   Microalbuminuria 07/22/2023   Skin tear of forearm without complication, right, initial encounter 01/31/2023   Food insecurity 01/25/2023   Current use of proton pump inhibitor 12/14/2022   Osteopenia 06/17/2022   Pulmonary emphysema (HCC) 09/03/2021   Agatston CAC score, >400 07/02/2021   Left-sided chest wall pain 06/04/2021   Lung cancer (HCC) 02/25/2021   Iron  deficiency anemia 01/01/2021   Iron  deficiency anemia due to chronic blood loss 11/06/2020   AVM (arteriovenous malformation) of colon    Benign neoplasm of colon    NASH (nonalcoholic steatohepatitis)    Malnutrition (HCC) 07/25/2019   HSV infection 02/23/2019   Aortic atherosclerosis (HCC) 01/22/2019   Coronary atherosclerosis 01/22/2019   Encounter for screening for lung cancer 01/04/2019   Venous insufficiency 11/15/2018   Hyponatremia 11/07/2018   Esophageal varices without bleeding (HCC) 10/27/2018   Anemia 10/23/2018   Marasmus (HCC) 07/26/2018   Poor balance 05/30/2018   Cirrhosis of liver (HCC) 04/24/2018   Gallstones 04/24/2018   Heme positive stool 04/24/2018   Smoker 11/07/2017   Welcome to Medicare preventive visit 10/26/2016   Estrogen deficiency 10/26/2016   Colon cancer screening 11/01/2014   Encounter for routine gynecological examination 09/12/2013   Left shoulder pain 08/20/2011   Routine general medical  examination at a health care facility 07/21/2011   Vitamin D  deficiency 08/04/2009   POSTMENOPAUSAL STATUS 08/04/2009   Prediabetes 02/18/2009   Hyperlipidemia 06/14/2008   DISC DISEASE, CERVICAL 03/30/2007   History of alcohol abuse 12/06/2006   Neuropathy of both feet 12/06/2006   DIVERTICULOSIS, COLON 12/06/2006   Fatty liver 12/06/2006   BREAST CANCER, HX OF 12/06/2006   Past Medical History:  Diagnosis Date   Alcohol abuse, unspecified    Breast cancer (HCC) 1998   Right   Cataract    left eye   Cervical spondylosis 2006   MRI   Degeneration of cervical intervertebral disc 2006   MRI   Diabetes mellitus without complication (HCC)    Diverticulosis of colon (without mention of hemorrhage)    Hyperpotassemia    Lung cancer (HCC) 03/2021   started radiation in January 2023, right side   Microscopic  hematuria    Mononeuritis of unspecified site    Nonspecific abnormal results of liver function study    Other abnormal glucose    Other and unspecified hyperlipidemia    no per pt   Other chronic nonalcoholic liver disease    Personal history of chemotherapy    Personal history of malignant neoplasm of breast    Personal history of radiation therapy    Pneumothorax, acute    right, spontaneous   Tobacco use disorder    Unspecified vitamin D  deficiency    Past Surgical History:  Procedure Laterality Date   BIOPSY  07/27/2019   Procedure: BIOPSY;  Surgeon: Leigh Elspeth SQUIBB, MD;  Location: [facility];  Service: Gastroenterology;;   BREAST BIOPSY  9/03   Right   BREAST LUMPECTOMY Right 1998   CHEST TUBE INSERTION  11/02/2014   COLONOSCOPY     COLONOSCOPY WITH PROPOFOL  N/A 07/27/2019   Procedure: COLONOSCOPY WITH PROPOFOL ;  Surgeon: Leigh Elspeth SQUIBB, MD;  Location: [facility];  Service: Gastroenterology;  Laterality: N/A;   COLONOSCOPY WITH PROPOFOL  N/A 07/29/2022   Procedure: COLONOSCOPY WITH PROPOFOL ;  Surgeon: Shila Gustav GAILS, MD;  Location: [facility];   Service: Gastroenterology;  Laterality: N/A;   ESOPHAGOGASTRODUODENOSCOPY (EGD) WITH PROPOFOL  N/A 10/30/2018   Procedure: ESOPHAGOGASTRODUODENOSCOPY (EGD) WITH PROPOFOL ;  Surgeon: Shila Gustav GAILS, MD;  Location: [facility];  Service: Endoscopy;  Laterality: N/A;   ESOPHAGOGASTRODUODENOSCOPY (EGD) WITH PROPOFOL  N/A 03/12/2019   Procedure: ESOPHAGOGASTRODUODENOSCOPY (EGD) WITH PROPOFOL ;  Surgeon: Shila Gustav GAILS, MD;  Location: [facility];  Service: Endoscopy;  Laterality: N/A;   ESOPHAGOGASTRODUODENOSCOPY (EGD) WITH PROPOFOL  N/A 07/27/2019   Procedure: ESOPHAGOGASTRODUODENOSCOPY (EGD) WITH PROPOFOL ;  Surgeon: Leigh Elspeth SQUIBB, MD;  Location: [facility];  Service: Gastroenterology;  Laterality: N/A;   EYE SURGERY  02/2017   cataract extraction with lens implant-left   HEMOSTASIS CLIP PLACEMENT  07/29/2022   Procedure: HEMOSTASIS CLIP PLACEMENT;  Surgeon: Shila Gustav GAILS, MD;  Location: [facility];  Service: Gastroenterology;;   HOT HEMOSTASIS N/A 07/27/2019   Procedure: HOT HEMOSTASIS (ARGON PLASMA COAGULATION/BICAP);  Surgeon: Leigh Elspeth SQUIBB, MD;  Location: [facility];  Service: Gastroenterology;  Laterality: N/A;   MOUTH SURGERY     POLYPECTOMY  07/27/2019   Procedure: POLYPECTOMY;  Surgeon: Leigh Elspeth SQUIBB, MD;  Location: [facility];  Service: Gastroenterology;;   POLYPECTOMY  07/29/2022   Procedure: POLYPECTOMY;  Surgeon: Shila Gustav GAILS, MD;  Location: [facility];  Service: Gastroenterology;;   TUBAL LIGATION     Social History   Tobacco Use   Smoking status: Every Day    Current packs/day: 1.00    Average packs/day: 1 pack/day for 59.1 years (59.1 ttl pk-yrs)    Types: Cigarettes    Start date: 09/25/2021    Passive exposure: Past   Smokeless tobacco: Never   Tobacco comments:    Started smoking at 73 years old.     1PPD 12/20/2022 khj  Vaping Use   Vaping status: Never Used  Substance Use Topics   Alcohol use: Not Currently   Drug use: No    Family History  Problem Relation Age of Onset   Heart failure Father    Heart attack Father    Colon cancer Maternal Uncle    Stroke Mother    Esophageal cancer Neg Hx    Rectal cancer Neg Hx    Stomach cancer Neg Hx    Pancreatic cancer Neg Hx    Allergies  Allergen Reactions   Anoro Ellipta  [Umeclidinium-Vilanterol]  Cough   Bevespi  Aerosphere [Glycopyrrolate-Formoterol ] Cough    Prolonged coughing episode with first dose   Oxycodone  Nausea And Vomiting   Crestor  [Rosuvastatin ]     Abdominal pain     Glipizide  Other (See Comments)    Stomach pain   Losartan      Pt thinks it caused urinary retention     Spironolactone      Urinary retention     Current Outpatient Medications on File Prior to Visit  Medication Sig Dispense Refill   acetaminophen  (TYLENOL ) 500 MG tablet Take 500 mg by mouth every 6 (six) hours as needed for moderate pain.     albuterol  (VENTOLIN  HFA) 108 (90 Base) MCG/ACT inhaler INHALE 2 PUFFS INTO THE LUNGS EVERY 4 HOURS AS NEEDED FOR WHEEZE OR FOR SHORTNESS OF BREATH 8.5 each 2   cholecalciferol (VITAMIN D3) 25 MCG (1000 UNIT) tablet Take 1,000 Units by mouth daily.     clorazepate  (TRANXENE ) 3.75 MG tablet Take 1 tablet (3.75 mg total) by mouth 3 (three) times daily as needed for anxiety. 90 tablet 2   cyclobenzaprine  (FLEXERIL ) 10 MG tablet TAKE 1/2 TABLET BY MOUTH AT BEDTIME AS NEEDED FOR MUSCLE SPASMS 15 tablet 3   desvenlafaxine  (PRISTIQ ) 100 MG 24 hr tablet TAKE 1 TABLET BY MOUTH EVERY DAY 30 tablet 0   lactulose  (CONSTULOSE ) 10 GM/15ML solution Take 15 mLs (10 g total) by mouth 2 (two) times daily as needed for severe constipation. 473 mL 1   Multiple Vitamin (MULTIVITAMIN) tablet Take 1 tablet by mouth daily.     Multiple Vitamins-Minerals (PRESERVISION AREDS 2) CAPS Take 1 capsule by mouth 2 (two) times daily.     mupirocin  ointment (BACTROBAN ) 2 % APPLY 1 APPLICATION TOPICALLY 2 TIMES DAILY AS NEEDED. 15 g 3   nadolol  (CORGARD ) 20 MG tablet Take  1 tablet (20 mg total) by mouth daily. 90 tablet 3   nystatin  (MYCOSTATIN ) 100000 UNIT/ML suspension TAKE 5 MLS (500,000 UNITS TOTAL) BY MOUTH 3 (THREE) TIMES DAILY. SWISH AND SWALLOW 120 mL 0   pantoprazole  (PROTONIX ) 40 MG tablet TAKE 1 TABLET (40 MG TOTAL) BY MOUTH TWICE A DAY BEFORE MEALS 180 tablet 1   rifaximin  (XIFAXAN ) 550 MG TABS tablet Take 1 tablet (550 mg total) by mouth 2 (two) times daily. 180 tablet 3   SYSTANE COMPLETE 0.6 % SOLN Apply 1 drop to eye 2 (two) times daily.     Tiotropium Bromide Monohydrate  (SPIRIVA  RESPIMAT) 2.5 MCG/ACT AERS INHALE 2 PUFFS BY MOUTH INTO THE LUNGS DAILY 4 g 11   ursodiol  (ACTIGALL ) 300 MG capsule TAKE 1 CAPSULE BY MOUTH EVERY DAY 90 capsule 3   No current facility-administered medications on file prior to visit.    Review of Systems  Constitutional:  Negative for activity change, appetite change, fatigue, fever and unexpected weight change.  HENT:  Negative for congestion, ear pain, rhinorrhea, sinus pressure and sore throat.   Eyes:  Negative for pain, redness and visual disturbance.  Respiratory:  Negative for cough, shortness of breath and wheezing.   Cardiovascular:  Positive for chest pain. Negative for palpitations and leg swelling.  Gastrointestinal:  Positive for abdominal pain. Negative for abdominal distention, anal bleeding, blood in stool, constipation, diarrhea, nausea, rectal pain and vomiting.  Endocrine: Negative for polydipsia and polyuria.  Genitourinary:  Negative for dysuria, frequency, hematuria, pelvic pain and urgency.       Left flank pain   Musculoskeletal:  Positive for back pain. Negative for arthralgias and myalgias.  Skin:  Negative for pallor and rash.  Allergic/Immunologic: Negative for environmental allergies.  Neurological:  Negative for dizziness, syncope and headaches.  Hematological:  Negative for adenopathy. Does not bruise/bleed easily.  Psychiatric/Behavioral:  Negative for decreased concentration and  dysphoric mood. The patient is not nervous/anxious.        Objective:   Physical Exam Constitutional:      General: She is not in acute distress.    Appearance: Normal appearance. She is well-developed. She is not ill-appearing, toxic-appearing or diaphoretic.     Comments: Underweight   HENT:     Head: Normocephalic and atraumatic.     Mouth/Throat:     Mouth: Mucous membranes are moist.     Pharynx: Oropharynx is clear.  Eyes:     General: No scleral icterus.       Right eye: No discharge.        Left eye: No discharge.     Conjunctiva/sclera: Conjunctivae normal.     Pupils: Pupils are equal, round, and reactive to light.  Neck:     Thyroid : No thyromegaly.     Vascular: No carotid bruit or JVD.  Cardiovascular:     Rate and Rhythm: Regular rhythm. Tachycardia present.     Heart sounds: Normal heart sounds.     No gallop.  Pulmonary:     Effort: Pulmonary effort is normal. No respiratory distress.     Breath sounds: Wheezing present. No rales.     Comments: Diffusely distant bs Mild exp wheezes throughout   No rib tenderness or crepitus (not tender in area of pain)  Abdominal:     General: Abdomen is flat. Bowel sounds are normal. There is no distension or abdominal bruit.     Palpations: Abdomen is soft. There is no shifting dullness, fluid wave, mass or pulsatile mass.     Tenderness: There is no abdominal tenderness. There is no right CVA tenderness, left CVA tenderness, guarding or rebound. Negative signs include Murphy's sign.     Hernia: No hernia is present.     Comments: Non tender in area of pain  Musculoskeletal:     Cervical back: Normal range of motion and neck supple.     Right lower leg: No edema.     Left lower leg: No edema.     Comments: No spinal tenderness   Lymphadenopathy:     Cervical: No cervical adenopathy.  Skin:    General: Skin is warm and dry.     Coloration: Skin is not jaundiced or pale.     Findings: No bruising or rash.      Comments: Inflamed SK on left lateral low abdomen No rashes Skin is mildly dry No jaundice   Neurological:     Mental Status: She is alert.     Cranial Nerves: No cranial nerve deficit.     Motor: No weakness.     Coordination: Coordination normal.     Deep Tendon Reflexes: Reflexes are normal and symmetric. Reflexes normal.  Psychiatric:        Mood and Affect: Mood normal.           Assessment & Plan:   Problem List Items Addressed This Visit       Respiratory   Pulmonary emphysema (HCC)   Pt notes wheezing and exertional shortness of breath is worsening lately  Plans to follow up with pulmonary Encouraged strongly to quit smoking        Other   Smoker   Continues  to smoke       Prediabetes   A1c ordered       Relevant Orders   Basic metabolic panel with GFR   Hepatic function panel   CBC with Differential/Platelet   Lipase   Hemoglobin A1c   Left-sided chest wall pain   Left upper abdomen, flank, lateral chest wall and back   See a/p for left abd pain       Left lateral abdominal pain - Primary   Pain in left lateral abdomen, left anterior/lateral chest and back, and flank  Not much tenderness No rash but does have some itching of her back  No new GI symptoms and no urinary symptoms  Reviewed ER records (normal trop) and cxr from 8/27 Reviewed CT from 10/2023- pain started shortly after that  History of lung cancer (right) and right breast in past  Cirrhosis / smoking/copd (pt thinks has worsened)  Reviewed MRI abdomen from 04/2023 noting cirrhosis, small volume cholelithiasis, normal urinary tract, stomach/bowel/spleen and pancreas Cxr 11/2023 -no new findings Reviewed recent labs/imaging from ER as well   Lab today  UA if she can give sample  Tramadol  (short term) prescription for severe pain   Call back and Er precautions noted in detail today    If all normal consider possib neuropathic pain        Relevant Orders   Basic metabolic  panel with GFR   Hepatic function panel   CBC with Differential/Platelet   Lipase   Hemoglobin A1c   Left flank pain   Left flank/ abdomen and chest wall  See a/p for left abdominal pain       Hyperlipidemia   Reviewed cardiology note (in setting of CAD) Was given ok per GI to start low dose statin due to stability of liver condition

## 2023-12-14 NOTE — Assessment & Plan Note (Signed)
 A1c ordered.

## 2023-12-14 NOTE — Patient Instructions (Signed)
 Labs today for abdominal and chest pain   Try the tramadol  (with caution of sedation) for pain while we try and find out what your pain is from    If you cannot breathe or pain becomes more severe- call 911

## 2023-12-14 NOTE — Assessment & Plan Note (Signed)
 Left upper abdomen, flank, lateral chest wall and back   See a/p for left abd pain

## 2023-12-14 NOTE — Assessment & Plan Note (Addendum)
 Pain in left lateral abdomen, left anterior/lateral chest and back, and flank  Not much tenderness No rash but does have some itching of her back  No new GI symptoms and no urinary symptoms  Reviewed ER records (normal trop) and cxr from 8/27 Reviewed CT from 10/2023- pain started shortly after that  History of lung cancer (right) and right breast in past  Cirrhosis / smoking/copd (pt thinks has worsened)  Reviewed MRI abdomen from 04/2023 noting cirrhosis, small volume cholelithiasis, normal urinary tract, stomach/bowel/spleen and pancreas Cxr 11/2023 -no new findings Reviewed recent labs/imaging from ER as well   Lab today  UA if she can give sample  Tramadol  (short term) prescription for severe pain   Call back and Er precautions noted in detail today    If all normal consider possib neuropathic pain

## 2023-12-15 ENCOUNTER — Ambulatory Visit: Payer: Self-pay | Admitting: Family Medicine

## 2023-12-15 DIAGNOSIS — R109 Unspecified abdominal pain: Secondary | ICD-10-CM

## 2023-12-15 LAB — BASIC METABOLIC PANEL WITH GFR
BUN: 15 mg/dL (ref 6–23)
CO2: 31 meq/L (ref 19–32)
Calcium: 9.3 mg/dL (ref 8.4–10.5)
Chloride: 95 meq/L — ABNORMAL LOW (ref 96–112)
Creatinine, Ser: 0.78 mg/dL (ref 0.40–1.20)
GFR: 75.57 mL/min (ref >60.00)
Glucose, Bld: 173 mg/dL — ABNORMAL HIGH (ref 70–99)
Potassium: 4.1 meq/L (ref 3.5–5.1)
Sodium: 135 meq/L (ref 135–145)

## 2023-12-15 LAB — HEPATIC FUNCTION PANEL
ALT: 11 U/L (ref 0–35)
AST: 22 U/L (ref 0–37)
Albumin: 4 g/dL (ref 3.5–5.2)
Alkaline Phosphatase: 73 U/L (ref 39–117)
Bilirubin, Direct: 0.1 mg/dL (ref 0.0–0.3)
Total Bilirubin: 0.7 mg/dL (ref 0.2–1.2)
Total Protein: 6.8 g/dL (ref 6.0–8.3)

## 2023-12-15 LAB — CBC WITH DIFFERENTIAL/PLATELET
Basophils Absolute: 0.1 K/uL (ref 0.0–0.1)
Basophils Relative: 0.7 % (ref 0.0–3.0)
Eosinophils Absolute: 0.1 K/uL (ref 0.0–0.7)
Eosinophils Relative: 0.5 % (ref 0.0–5.0)
HCT: 43.7 % (ref 36.0–46.0)
Hemoglobin: 14.8 g/dL (ref 12.0–15.0)
Lymphocytes Relative: 15 % (ref 12.0–46.0)
Lymphs Abs: 1.4 K/uL (ref 0.7–4.0)
MCHC: 33.9 g/dL (ref 30.0–36.0)
MCV: 98.4 fl (ref 78.0–100.0)
Monocytes Absolute: 0.6 K/uL (ref 0.1–1.0)
Monocytes Relative: 6.2 % (ref 3.0–12.0)
Neutro Abs: 7.4 K/uL (ref 1.4–7.7)
Neutrophils Relative %: 77.6 % — ABNORMAL HIGH (ref 43.0–77.0)
Platelets: 193 K/uL (ref 150.0–400.0)
RBC: 4.44 Mil/uL (ref 3.87–5.11)
RDW: 15.3 % (ref 11.5–15.5)
WBC: 9.5 K/uL (ref 4.0–10.5)

## 2023-12-15 LAB — HEMOGLOBIN A1C: Hgb A1c MFr Bld: 6.7 % — ABNORMAL HIGH (ref 4.6–6.5)

## 2023-12-15 LAB — LIPASE: Lipase: 24 U/L (ref 11.0–59.0)

## 2023-12-19 ENCOUNTER — Telehealth: Payer: Self-pay

## 2023-12-19 NOTE — Telephone Encounter (Signed)
 SABRA

## 2023-12-19 NOTE — Telephone Encounter (Signed)
 I spoke with pt; since pt called on 12/17/23 pt said her pain has eased off considerable. Now pain level is 2 - 3. Pt has not seen any rash or whelps. Offered pt appt to be seen and pt declined stating she was feeling better and doesn't need appt now. UC & ED precautions given and pt voiced understanding,. Sending note to Dr Randeen and [employer].

## 2023-12-22 ENCOUNTER — Ambulatory Visit: Admitting: Family Medicine

## 2023-12-22 ENCOUNTER — Encounter: Payer: Self-pay | Admitting: Family Medicine

## 2023-12-22 ENCOUNTER — Ambulatory Visit (INDEPENDENT_AMBULATORY_CARE_PROVIDER_SITE_OTHER)
Admission: RE | Admit: 2023-12-22 | Discharge: 2023-12-22 | Disposition: A | Discharge status: Home / Self Care | Source: Ambulatory Visit | Attending: Family Medicine | Admitting: Family Medicine

## 2023-12-22 ENCOUNTER — Ambulatory Visit: Payer: Self-pay | Admitting: Family Medicine

## 2023-12-22 VITALS — BP 110/68 | HR 94 | Temp 97.8°F | Ht 64.0 in | Wt 107.5 lb

## 2023-12-22 DIAGNOSIS — C3431 Malignant neoplasm of lower lobe, right bronchus or lung: Secondary | ICD-10-CM | POA: Diagnosis not present

## 2023-12-22 DIAGNOSIS — I878 Other specified disorders of veins: Secondary | ICD-10-CM | POA: Diagnosis not present

## 2023-12-22 DIAGNOSIS — R0789 Other chest pain: Secondary | ICD-10-CM | POA: Diagnosis not present

## 2023-12-22 DIAGNOSIS — R109 Unspecified abdominal pain: Secondary | ICD-10-CM

## 2023-12-22 DIAGNOSIS — E042 Nontoxic multinodular goiter: Secondary | ICD-10-CM

## 2023-12-22 LAB — POC URINALSYSI DIPSTICK (AUTOMATED)
Bilirubin, UA: NEGATIVE
Blood, UA: NEGATIVE
Glucose, UA: NEGATIVE
Ketones, UA: NEGATIVE
Leukocytes, UA: NEGATIVE
Nitrite, UA: NEGATIVE
Protein, UA: NEGATIVE
Spec Grav, UA: 1.01 (ref 1.010–1.025)
Urobilinogen, UA: 0.2 U/dL
pH, UA: 6.5 (ref 5.0–8.0)

## 2023-12-22 MED ORDER — TRAMADOL HCL 50 MG PO TABS: 100.0000 mg | ORAL_TABLET | Freq: Three times a day (TID) | ORAL | 0 refills | Status: AC | PRN

## 2023-12-22 NOTE — Assessment & Plan Note (Signed)
 But no tenderness   TS film normal as is abd film  No changes on recent cxr late aug   Plan to order CT chest earlier than planned  Increase tramadol  with caution to 2 pills up to TID since pain is worse

## 2023-12-22 NOTE — Progress Notes (Addendum)
 Subjective:    Patient ID: Christine Barton, female    DOB: 1950-05-27, 73 y.o.   MRN: [id number]  HPI  Wt Readings from Last 3 Encounters:  12/22/23 107 lb 8 oz (48.8 kg)  12/14/23 107 lb 6 oz (48.7 kg)  12/07/23 105 lb 13.1 oz (48 kg)   18.45 kg/m  Vitals:   12/22/23 1231  BP: 110/68  Pulse: 94  Temp: 97.8 F (36.6 C)  SpO2: 97%    Pt presents with ongoing pain in her left chest/abdomen/side/flank  She got better and then got worse  Sunday and Monday was doing better overall  Worse by Monday night   Today pain is 7-8 /10  Under her left breast and side of chest wall  Sharper than it was  Hot pad helps some   No blisters or rash  No pain to take deep breath  Some constipation  No diarrhea  No n/v No fever  No change in breathing or cough   Has history of lung cancer under obs  Followed by oncology Has had radiation treatment  Not due for next CT yet   CT 10/2023 IMPRESSION: 1. Radiation changes in the right hemithorax, as above. 2. Stable underlying nodularity in the superior segment right lower lobe, non FDG-avid on prior PET, likely benign. 3. No evidence of recurrent or metastatic disease.  Ordered TS xray and abd film   CT Chest Wo Contrast Result Date: 12/28/2023 CLINICAL DATA:  Chest wall pain, nontraumatic, infection or inflammation suspected, xray done history of lung cancer and copd, now new L sided chest and flank pain of unknown etiology EXAM: CT CHEST WITHOUT CONTRAST TECHNIQUE: Multidetector CT imaging of the chest was performed following the standard protocol without IV contrast. RADIATION DOSE REDUCTION: This exam was performed according to the departmental dose-optimization program which includes automated exposure control, adjustment of the mA and/or kV according to patient size and/or use of iterative reconstruction technique. COMPARISON:  CT chest 10/17/2023, PET CT 02/11/2021 FINDINGS: Cardiovascular: Normal heart size. No significant  pericardial effusion. The thoracic aorta is normal in caliber. Severe atherosclerotic plaque of the thoracic aorta. Four-vessel coronary artery calcifications. Mediastinum/Nodes: Right axillary lymph node dissection. No gross hilar adenopathy, noting limited sensitivity for the detection of hilar adenopathy on this noncontrast study. No enlarged mediastinal or axillary lymph nodes. Trachea and esophagus demonstrate no significant findings. Heterogeneous thyroid  glands with nodules measuring up to 1.6 cm. Calcifications within the thyroid  gland is noted. Lungs/Pleura: Biapical paraseptal emphysematous changes. No focal consolidation. Similar-appearing chronic right upper lobe spiculated 4.2 x 3.2 cm mass with pleural tethering noted. Subpleural right focal partially calcified masslike airspace opacity measuring 4.1 x 2.5 cm appears stable from prior. 4 mm right middle lobe pulmonary nodule (4:64, 6:57). Similar-appearing right upper lobe anterior subpleural reticulations likely sequelae of prior radiation. No pleural effusion. No pneumothorax. Upper Abdomen: Cholelithiasis.  Nodular hepatic contour. Musculoskeletal: No chest wall abnormality. No suspicious lytic or blastic osseous lesions. No acute displaced fracture. IMPRESSION: 1. No acute intrathoracic abnormality. 2. A 4 mm right middle lobe pulmonary nodule. 3. Similar-appearing chronic right upper lobe spiculated 4.2 x 3.2 cm mass with pleural tethering. 4. Similar-appearing chronic subpleural 4.1 x 2.5 cm right apical partially calcified masslike airspace opacity. 5.  Emphysema (ICD10-J43.9). 6. Cirrhosis. No focal liver lesions identified. Please note that liver protocol enhanced MR and CT are the most sensitive tests for the screening detection of hepatocellular carcinoma in the high risk setting of cirrhosis. 7.  Cholelithiasis. 8. Thyroid  gland nodules measuring up to 1.6 cm. Recommend thyroid  US  (ref: J Am Coll Radiol. 2015 Feb;12(2): 143-50).  Electronically Signed   By: Morgane  Naveau M.D.   On: 12/28/2023 17:19   DG Thoracic Spine 2 View Result Date: 12/22/2023 CLINICAL DATA:  Left flank pain. EXAM: DG THORACIC SPINE 2V COMPARISON:  October 17, 2023. FINDINGS: There is no evidence of thoracic spine fracture. Alignment is normal. No other significant bone abnormalities are identified. IMPRESSION: Negative. Electronically Signed   By: Lynwood Landy Raddle M.D.   On: 12/22/2023 13:47   DG Abd 1 View Result Date: 12/22/2023 CLINICAL DATA:  Left-sided abdominal pain. EXAM: ABDOMEN - 1 VIEW COMPARISON:  None Available. FINDINGS: No abnormal bowel dilatation. Mild amount of stool seen throughout the colon. Phleboliths and vascular calcifications are seen in the pelvis. IMPRESSION: No abnormal bowel dilatation. Mild stool burden. Electronically Signed   By: Lynwood Landy Raddle M.D.   On: 12/22/2023 13:45   DG Chest 2 View Result Date: 12/07/2023 CLINICAL DATA:  Chest pain.  Shortness of breath. EXAM: CHEST - 2 VIEW COMPARISON:  CT scan chest from 10/17/2023. FINDINGS: There are heterogeneous calcific densities overlying the right upper lung zone, which corresponds to pleural calcification on the recent CT scan chest. No significant interval change considering the difference in technique. There is additional horizontal heterogeneous opacity overlying the right upper mid lung zone junction region, which also appears essentially unchanged since the prior chest CT scan. Bilateral lungs are otherwise clear. No acute consolidation or lung collapse. Bilateral costophrenic angles are clear. Normal cardio-mediastinal silhouette. No acute osseous abnormalities. The soft tissues are within normal limits. IMPRESSION: No active cardiopulmonary disease. Electronically Signed   By: Ree Molt M.D.   On: 12/07/2023 16:23    Results for orders placed or performed in visit on 12/22/23  POCT Urinalysis Dipstick (Automated)   Collection Time: 12/22/23  2:24 PM  Result Value  Ref Range   Color, UA yellow    Clarity, UA clear    Glucose, UA Negative Negative   Bilirubin, UA negative    Ketones, UA negative    Spec Grav, UA 1.010 1.010 - 1.025   Blood, UA negative    pH, UA 6.5 5.0 - 8.0   Protein, UA Negative Negative   Urobilinogen, UA 0.2 0.2 or 1.0 E.U./dL   Nitrite, UA negative    Leukocytes, UA Negative Negative   *Note: Due to a large number of results and/or encounters for the requested time period, some results have not been displayed. A complete set of results can be found in Results Review.      Patient Active Problem List   Diagnosis Date Noted   Multiple thyroid  nodules 01/01/2024   Left lateral abdominal pain 12/14/2023   Left flank pain 12/14/2023   Tick bite of left flank 10/11/2023   Depression with anxiety 07/22/2023   Microalbuminuria 07/22/2023   Skin tear of forearm without complication, right, initial encounter 01/31/2023   Food insecurity 01/25/2023   Current use of proton pump inhibitor 12/14/2022   Osteopenia 06/17/2022   Pulmonary emphysema (HCC) 09/03/2021   Agatston CAC score, >400 07/02/2021   Left-sided chest wall pain 06/04/2021   Lung cancer (HCC) 02/25/2021   Iron  deficiency anemia 01/01/2021   Iron  deficiency anemia due to chronic blood loss 11/06/2020   AVM (arteriovenous malformation) of colon    Benign neoplasm of colon    NASH (nonalcoholic steatohepatitis)    Malnutrition 07/25/2019  HSV infection 02/23/2019   Aortic atherosclerosis 01/22/2019   Coronary atherosclerosis 01/22/2019   Encounter for screening for lung cancer 01/04/2019   Venous insufficiency 11/15/2018   Hyponatremia 11/07/2018   Esophageal varices without bleeding (HCC) 10/27/2018   Anemia 10/23/2018   Marasmus 07/26/2018   Poor balance 05/30/2018   Cirrhosis of liver (HCC) 04/24/2018   Gallstones 04/24/2018   Heme positive stool 04/24/2018   Smoker 11/07/2017   Welcome to Medicare preventive visit 10/26/2016   Estrogen deficiency  10/26/2016   Colon cancer screening 11/01/2014   Encounter for routine gynecological examination 09/12/2013   Left shoulder pain 08/20/2011   Routine general medical examination at a health care facility 07/21/2011   Vitamin D  deficiency 08/04/2009   POSTMENOPAUSAL STATUS 08/04/2009   Prediabetes 02/18/2009   Hyperlipidemia 06/14/2008   DISC DISEASE, CERVICAL 03/30/2007   History of alcohol abuse 12/06/2006   Neuropathy of both feet 12/06/2006   DIVERTICULOSIS, COLON 12/06/2006   Fatty liver 12/06/2006   BREAST CANCER, HX OF 12/06/2006   Past Medical History:  Diagnosis Date   Alcohol abuse, unspecified    Breast cancer (HCC) 1998   Right   Cataract    left eye   Cervical spondylosis 2006   MRI   Degeneration of cervical intervertebral disc 2006   MRI   Diabetes mellitus without complication (HCC)    Diverticulosis of colon (without mention of hemorrhage)    Hyperpotassemia    Lung cancer (HCC) 03/2021   started radiation in January 2023, right side   Microscopic hematuria    Mononeuritis of unspecified site    Nonspecific abnormal results of liver function study    Other abnormal glucose    Other and unspecified hyperlipidemia    no per pt   Other chronic nonalcoholic liver disease    Personal history of chemotherapy    Personal history of malignant neoplasm of breast    Personal history of radiation therapy    Pneumothorax, acute    right, spontaneous   Tobacco use disorder    Unspecified vitamin D  deficiency    Past Surgical History:  Procedure Laterality Date   BIOPSY  07/27/2019   Procedure: BIOPSY;  Surgeon: Leigh Elspeth SQUIBB, MD;  Location: [facility];  Service: Gastroenterology;;   BREAST BIOPSY  9/03   Right   BREAST LUMPECTOMY Right 1998   CHEST TUBE INSERTION  11/02/2014   COLONOSCOPY     COLONOSCOPY WITH PROPOFOL  N/A 07/27/2019   Procedure: COLONOSCOPY WITH PROPOFOL ;  Surgeon: Leigh Elspeth SQUIBB, MD;  Location: [facility];  Service:  Gastroenterology;  Laterality: N/A;   COLONOSCOPY WITH PROPOFOL  N/A 07/29/2022   Procedure: COLONOSCOPY WITH PROPOFOL ;  Surgeon: Shila Gustav GAILS, MD;  Location: [facility];  Service: Gastroenterology;  Laterality: N/A;   ESOPHAGOGASTRODUODENOSCOPY (EGD) WITH PROPOFOL  N/A 10/30/2018   Procedure: ESOPHAGOGASTRODUODENOSCOPY (EGD) WITH PROPOFOL ;  Surgeon: Shila Gustav GAILS, MD;  Location: [facility];  Service: Endoscopy;  Laterality: N/A;   ESOPHAGOGASTRODUODENOSCOPY (EGD) WITH PROPOFOL  N/A 03/12/2019   Procedure: ESOPHAGOGASTRODUODENOSCOPY (EGD) WITH PROPOFOL ;  Surgeon: Shila Gustav GAILS, MD;  Location: [facility];  Service: Endoscopy;  Laterality: N/A;   ESOPHAGOGASTRODUODENOSCOPY (EGD) WITH PROPOFOL  N/A 07/27/2019   Procedure: ESOPHAGOGASTRODUODENOSCOPY (EGD) WITH PROPOFOL ;  Surgeon: Leigh Elspeth SQUIBB, MD;  Location: [facility];  Service: Gastroenterology;  Laterality: N/A;   EYE SURGERY  02/2017   cataract extraction with lens implant-left   HEMOSTASIS CLIP PLACEMENT  07/29/2022   Procedure: HEMOSTASIS CLIP PLACEMENT;  Surgeon: Nandigam, Kavitha V, MD;  Location: [facility];  Service: Gastroenterology;;   HOT HEMOSTASIS N/A 07/27/2019   Procedure: HOT HEMOSTASIS (ARGON PLASMA COAGULATION/BICAP);  Surgeon: Leigh Elspeth SQUIBB, MD;  Location: [facility];  Service: Gastroenterology;  Laterality: N/A;   MOUTH SURGERY     POLYPECTOMY  07/27/2019   Procedure: POLYPECTOMY;  Surgeon: Leigh Elspeth SQUIBB, MD;  Location: [facility];  Service: Gastroenterology;;   POLYPECTOMY  07/29/2022   Procedure: POLYPECTOMY;  Surgeon: Shila Gustav GAILS, MD;  Location: [facility];  Service: Gastroenterology;;   TUBAL LIGATION     Social History   Tobacco Use   Smoking status: Every Day    Current packs/day: 1.00    Average packs/day: 1 pack/day for 59.2 years (59.2 ttl pk-yrs)    Types: Cigarettes    Start date: 09/25/2021    Passive exposure: Past   Smokeless tobacco: Never   Tobacco  comments:    Started smoking at 73 years old.     1PPD 12/20/2022 khj  Vaping Use   Vaping status: Never Used  Substance Use Topics   Alcohol use: Not Currently   Drug use: No   Family History  Problem Relation Age of Onset   Heart failure Father    Heart attack Father    Colon cancer Maternal Uncle    Stroke Mother    Esophageal cancer Neg Hx    Rectal cancer Neg Hx    Stomach cancer Neg Hx    Pancreatic cancer Neg Hx    Allergies  Allergen Reactions   Anoro Ellipta  [Umeclidinium-Vilanterol] Cough   Bevespi  Aerosphere [Glycopyrrolate-Formoterol ] Cough    Prolonged coughing episode with first dose   Oxycodone  Nausea And Vomiting   Crestor  [Rosuvastatin ]     Abdominal pain     Glipizide  Other (See Comments)    Stomach pain   Losartan      Pt thinks it caused urinary retention     Spironolactone      Urinary retention     Current Outpatient Medications on File Prior to Visit  Medication Sig Dispense Refill   acetaminophen  (TYLENOL ) 500 MG tablet Take 500 mg by mouth every 6 (six) hours as needed for moderate pain.     albuterol  (VENTOLIN  HFA) 108 (90 Base) MCG/ACT inhaler INHALE 2 PUFFS INTO THE LUNGS EVERY 4 HOURS AS NEEDED FOR WHEEZE OR FOR SHORTNESS OF BREATH 8.5 each 2   cholecalciferol (VITAMIN D3) 25 MCG (1000 UNIT) tablet Take 1,000 Units by mouth daily.     clorazepate  (TRANXENE ) 3.75 MG tablet Take 1 tablet (3.75 mg total) by mouth 3 (three) times daily as needed for anxiety. 90 tablet 2   cyclobenzaprine  (FLEXERIL ) 10 MG tablet TAKE 1/2 TABLET BY MOUTH AT BEDTIME AS NEEDED FOR MUSCLE SPASMS 15 tablet 3   desvenlafaxine  (PRISTIQ ) 100 MG 24 hr tablet TAKE 1 TABLET BY MOUTH EVERY DAY 30 tablet 0   Multiple Vitamin (MULTIVITAMIN) tablet Take 1 tablet by mouth daily.     Multiple Vitamins-Minerals (PRESERVISION AREDS 2) CAPS Take 1 capsule by mouth 2 (two) times daily.     mupirocin  ointment (BACTROBAN ) 2 % APPLY 1 APPLICATION TOPICALLY 2 TIMES DAILY AS NEEDED. 15 g 3    nadolol  (CORGARD ) 20 MG tablet Take 1 tablet (20 mg total) by mouth daily. 90 tablet 3   nystatin  (MYCOSTATIN ) 100000 UNIT/ML suspension TAKE 5 MLS (500,000 UNITS TOTAL) BY MOUTH 3 (THREE) TIMES DAILY. SWISH AND SWALLOW 120 mL 0   pantoprazole  (PROTONIX ) 40 MG tablet TAKE 1 TABLET (40 MG TOTAL)  BY MOUTH TWICE A DAY BEFORE MEALS 180 tablet 1   rifaximin  (XIFAXAN ) 550 MG TABS tablet Take 1 tablet (550 mg total) by mouth 2 (two) times daily. 180 tablet 3   SYSTANE COMPLETE 0.6 % SOLN Apply 1 drop to eye 2 (two) times daily.     Tiotropium Bromide Monohydrate  (SPIRIVA  RESPIMAT) 2.5 MCG/ACT AERS INHALE 2 PUFFS BY MOUTH INTO THE LUNGS DAILY 4 g 11   ursodiol  (ACTIGALL ) 300 MG capsule TAKE 1 CAPSULE BY MOUTH EVERY DAY 90 capsule 3   No current facility-administered medications on file prior to visit.    Review of Systems  Constitutional:  Negative for activity change, appetite change, fatigue, fever and unexpected weight change.  HENT:  Negative for congestion, ear pain, rhinorrhea, sinus pressure and sore throat.   Eyes:  Negative for pain, redness and visual disturbance.  Respiratory:  Negative for cough, shortness of breath and wheezing.   Cardiovascular:  Negative for chest pain and palpitations.  Gastrointestinal:  Negative for abdominal pain, blood in stool, constipation and diarrhea.       Left lower chest/rib and upper abd pain   Endocrine: Negative for polydipsia and polyuria.  Genitourinary:  Negative for dysuria, frequency and urgency.  Musculoskeletal:  Negative for arthralgias, back pain and myalgias.  Skin:  Negative for pallor and rash.  Allergic/Immunologic: Negative for environmental allergies.  Neurological:  Negative for dizziness, syncope and headaches.  Hematological:  Negative for adenopathy. Does not bruise/bleed easily.  Psychiatric/Behavioral:  Negative for decreased concentration and dysphoric mood. The patient is not nervous/anxious.        Objective:   Physical  Exam Constitutional:      General: She is not in acute distress.    Appearance: Normal appearance. She is well-developed. She is not ill-appearing or diaphoretic.     Comments: Underweight   HENT:     Head: Normocephalic and atraumatic.  Eyes:     Conjunctiva/sclera: Conjunctivae normal.     Pupils: Pupils are equal, round, and reactive to light.  Neck:     Thyroid : No thyromegaly.     Vascular: No carotid bruit or JVD.  Cardiovascular:     Rate and Rhythm: Normal rate and regular rhythm.     Heart sounds: Normal heart sounds.     No gallop.  Pulmonary:     Effort: Pulmonary effort is normal. No respiratory distress.     Breath sounds: Normal breath sounds. No wheezing or rales.     Comments: No chest wall tenderness in area of pain No rash Chest:     Chest wall: No tenderness.  Abdominal:     General: There is no distension or abdominal bruit.     Palpations: Abdomen is soft. There is no mass.     Tenderness: There is no abdominal tenderness. There is no right CVA tenderness, left CVA tenderness, guarding or rebound.     Comments: No tenderness in areas of pain  Genitourinary:    Comments: No breast tenderness Musculoskeletal:     Cervical back: Normal range of motion and neck supple.     Right lower leg: No edema.     Left lower leg: No edema.  Lymphadenopathy:     Cervical: No cervical adenopathy.  Skin:    General: Skin is warm and dry.     Coloration: Skin is not jaundiced or pale.     Findings: No bruising, erythema or rash.  Neurological:     Mental Status: She is  alert.     Coordination: Coordination normal.     Deep Tendon Reflexes: Reflexes are normal and symmetric. Reflexes normal.  Psychiatric:     Comments: Anxious regarding pain level           Assessment & Plan:   Problem List Items Addressed This Visit       Respiratory   Lung cancer (HCC)   Followed by pulmonary  Reviewed her last CT  May elect early CT in light of the pain she is having        Relevant Orders   CT Chest Wo Contrast (Completed)     Other   Left-sided chest wall pain - Primary   But no tenderness   TS film normal as is abd film  No changes on recent cxr late aug   Plan to order CT chest earlier than planned  Increase tramadol  with caution to 2 pills up to TID since pain is worse        Relevant Orders   CT Chest Wo Contrast (Completed)   Left lateral abdominal pain   Abd film noted mild stool burden but nothing new  Urinalysis clear   Plan on early CT chest   Will try increase tramadol  to 2 pills tid prn (caution of sedation and constipation- can use miralax if needed)       Relevant Orders   POCT Urinalysis Dipstick (Automated) (Completed)   Left flank pain   Urinalysis is clear Nothing new on abd and TS films   Pain almost seems nerve related No evidence of shingles   Will plan to get CT chest early   Will increase tramadol  dose to 100 mg if needed with caution   Consider gabapentin if this does not help       Relevant Orders   POCT Urinalysis Dipstick (Automated) (Completed)   CT Chest Wo Contrast (Completed)

## 2023-12-22 NOTE — Assessment & Plan Note (Addendum)
 Abd film noted mild stool burden but nothing new  Urinalysis clear   Plan on early CT chest   Will try increase tramadol  to 2 pills tid prn (caution of sedation and constipation- can use miralax if needed)

## 2023-12-22 NOTE — Assessment & Plan Note (Addendum)
 Urinalysis is clear Nothing new on abd and TS films   Pain almost seems nerve related No evidence of shingles   Will plan to get CT chest early   Will increase tramadol  dose to 100 mg if needed with caution   Consider gabapentin if this does not help

## 2023-12-22 NOTE — Patient Instructions (Addendum)
 Thoracic spine film today  Abdominal film today   Urinalysis today if we can get a sample   If tests are unrevealing then I want to order an earlier CT of your chest   We will reach out with results when they return and make a plan   For pain - try taking 2 tramadol  instead of one  Careful of sedation/habit and constipation

## 2023-12-25 ENCOUNTER — Telehealth: Payer: Self-pay | Admitting: Family Medicine

## 2023-12-25 DIAGNOSIS — D509 Iron deficiency anemia, unspecified: Secondary | ICD-10-CM

## 2023-12-25 DIAGNOSIS — Z79899 Other long term (current) drug therapy: Secondary | ICD-10-CM

## 2023-12-25 DIAGNOSIS — E559 Vitamin D deficiency, unspecified: Secondary | ICD-10-CM

## 2023-12-25 DIAGNOSIS — K7031 Alcoholic cirrhosis of liver with ascites: Secondary | ICD-10-CM

## 2023-12-25 DIAGNOSIS — D500 Iron deficiency anemia secondary to blood loss (chronic): Secondary | ICD-10-CM

## 2023-12-25 DIAGNOSIS — R7303 Prediabetes: Secondary | ICD-10-CM

## 2023-12-25 DIAGNOSIS — E41 Nutritional marasmus: Secondary | ICD-10-CM

## 2023-12-25 DIAGNOSIS — E7800 Pure hypercholesterolemia, unspecified: Secondary | ICD-10-CM

## 2023-12-25 NOTE — Telephone Encounter (Signed)
-----   Message from Harlene Du sent at 12/16/2023  1:25 PM EDT ----- Regarding: Lab Tues 12/27/23 Hello,  Patient is coming in for CPE labs on Tuesday 12/27/23. Can we get orders please.   Thanks

## 2023-12-26 ENCOUNTER — Encounter: Payer: Self-pay | Admitting: Physician Assistant

## 2023-12-26 NOTE — Progress Notes (Signed)
Orders placed in this encounter.

## 2023-12-27 ENCOUNTER — Ambulatory Visit: Payer: Self-pay | Admitting: Family Medicine

## 2023-12-27 ENCOUNTER — Other Ambulatory Visit (INDEPENDENT_AMBULATORY_CARE_PROVIDER_SITE_OTHER)

## 2023-12-27 DIAGNOSIS — Z79899 Other long term (current) drug therapy: Secondary | ICD-10-CM

## 2023-12-27 DIAGNOSIS — E559 Vitamin D deficiency, unspecified: Secondary | ICD-10-CM

## 2023-12-27 DIAGNOSIS — K7031 Alcoholic cirrhosis of liver with ascites: Secondary | ICD-10-CM | POA: Diagnosis not present

## 2023-12-27 DIAGNOSIS — D509 Iron deficiency anemia, unspecified: Secondary | ICD-10-CM | POA: Diagnosis not present

## 2023-12-27 DIAGNOSIS — D500 Iron deficiency anemia secondary to blood loss (chronic): Secondary | ICD-10-CM

## 2023-12-27 DIAGNOSIS — E7800 Pure hypercholesterolemia, unspecified: Secondary | ICD-10-CM

## 2023-12-27 LAB — CBC WITH DIFFERENTIAL/PLATELET
Basophils Absolute: 0 K/uL (ref 0.0–0.1)
Basophils Relative: 0.3 % (ref 0.0–3.0)
Eosinophils Absolute: 0.1 K/uL (ref 0.0–0.7)
Eosinophils Relative: 1.5 % (ref 0.0–5.0)
HCT: 44.3 % (ref 36.0–46.0)
Hemoglobin: 15 g/dL (ref 12.0–15.0)
Lymphocytes Relative: 26.2 % (ref 12.0–46.0)
Lymphs Abs: 2.1 K/uL (ref 0.7–4.0)
MCHC: 33.9 g/dL (ref 30.0–36.0)
MCV: 97.1 fl (ref 78.0–100.0)
Monocytes Absolute: 0.7 K/uL (ref 0.1–1.0)
Monocytes Relative: 9.2 % (ref 3.0–12.0)
Neutro Abs: 5.1 K/uL (ref 1.4–7.7)
Neutrophils Relative %: 62.8 % (ref 43.0–77.0)
Platelets: 232 K/uL (ref 150.0–400.0)
RBC: 4.56 Mil/uL (ref 3.87–5.11)
RDW: 15.2 % (ref 11.5–15.5)
WBC: 8.2 K/uL (ref 4.0–10.5)

## 2023-12-27 LAB — LIPID PANEL
Cholesterol: 196 mg/dL (ref 0–200)
HDL: 41.8 mg/dL (ref >39.00)
LDL Cholesterol: 104 mg/dL — ABNORMAL HIGH (ref 0–99)
NonHDL: 154.42
Total CHOL/HDL Ratio: 5
Triglycerides: 254 mg/dL — ABNORMAL HIGH (ref 0.0–149.0)
VLDL: 50.8 mg/dL — ABNORMAL HIGH (ref 0.0–40.0)

## 2023-12-27 LAB — TSH: TSH: 2.55 u[IU]/mL (ref 0.35–5.50)

## 2023-12-27 LAB — VITAMIN D 25 HYDROXY (VIT D DEFICIENCY, FRACTURES): VITD: 92.49 ng/mL (ref 30.00–100.00)

## 2023-12-27 LAB — VITAMIN B12: Vitamin B-12: 324 pg/mL (ref 211–911)

## 2023-12-28 ENCOUNTER — Ambulatory Visit
Admission: RE | Admit: 2023-12-28 | Discharge: 2023-12-28 | Disposition: A | Discharge status: Home / Self Care | Source: Ambulatory Visit | Attending: Family Medicine | Admitting: Family Medicine

## 2023-12-28 ENCOUNTER — Telehealth: Payer: Self-pay | Admitting: *Deleted

## 2023-12-28 ENCOUNTER — Other Ambulatory Visit: Payer: Self-pay | Admitting: Gastroenterology

## 2023-12-28 DIAGNOSIS — E042 Nontoxic multinodular goiter: Secondary | ICD-10-CM | POA: Diagnosis not present

## 2023-12-28 DIAGNOSIS — C3431 Malignant neoplasm of lower lobe, right bronchus or lung: Secondary | ICD-10-CM | POA: Diagnosis not present

## 2023-12-28 DIAGNOSIS — R109 Unspecified abdominal pain: Secondary | ICD-10-CM | POA: Diagnosis not present

## 2023-12-28 DIAGNOSIS — C3490 Malignant neoplasm of unspecified part of unspecified bronchus or lung: Secondary | ICD-10-CM | POA: Diagnosis not present

## 2023-12-28 DIAGNOSIS — R0789 Other chest pain: Secondary | ICD-10-CM | POA: Diagnosis not present

## 2023-12-28 DIAGNOSIS — J439 Emphysema, unspecified: Secondary | ICD-10-CM | POA: Diagnosis not present

## 2023-12-28 NOTE — Telephone Encounter (Signed)
 Will route to referral dpt to f/u with pt regarding STAT CT

## 2023-12-28 NOTE — Telephone Encounter (Signed)
 Patient's CT scan finally approved by insurance.   Patient is calling to schedule her appt.

## 2023-12-28 NOTE — Telephone Encounter (Signed)
 Copied from CRM [id number]. Topic: Clinical - Medical Advice >> Dec 28, 2023 11:47 AM [place] A wrote: Reason for CRM: Patient is inuring about what the process is for her CT Scan- Patient is asking for [facility] to call

## 2023-12-29 ENCOUNTER — Telehealth: Payer: Self-pay | Admitting: *Deleted

## 2023-12-29 ENCOUNTER — Encounter: Payer: Self-pay | Admitting: Family Medicine

## 2023-12-29 NOTE — Telephone Encounter (Signed)
 Sent mychart with Dr. Graham comments

## 2023-12-29 NOTE — Telephone Encounter (Signed)
 Copied from CRM [id number]. Topic: General - Other >> Dec 29, 2023 12:31 PM Burnard DEL wrote: Reason for CRM: Patient called I to let Dr Randeen know that she is feeling much much better today.She said that she woke up and had little pain. She said that she was suppose to have an order US  sent out ,but she doesn't need it done anymore.

## 2023-12-29 NOTE — Telephone Encounter (Signed)
 So glad she is feeling better! Will hold off on pain clinic referral   The thyroid  ultrasound is to better image nodule seen on CT (entirely incidental-not causing pain)  It is up to her whether she gets it  It is to see if any nodules look suspicious or need further monitoring

## 2023-12-30 ENCOUNTER — Encounter: Admitting: Family Medicine

## 2023-12-31 LAB — COMPREHENSIVE METABOLIC PANEL WITH GFR
ALT: 9 U/L (ref 0–35)
AST: 20 U/L (ref 0–37)
Albumin: 4.3 g/dL (ref 3.5–5.2)
Alkaline Phosphatase: 76 U/L (ref 39–117)
BUN: 14 mg/dL (ref 6–23)
CO2: 32 meq/L (ref 19–32)
Calcium: 10.4 mg/dL (ref 8.4–10.5)
Chloride: 96 meq/L (ref 96–112)
Creatinine, Ser: 0.66 mg/dL (ref 0.40–1.20)
GFR: 87.26 mL/min (ref >60.00)
Glucose, Bld: 117 mg/dL — ABNORMAL HIGH (ref 70–99)
Potassium: 4.8 meq/L (ref 3.5–5.1)
Sodium: 135 meq/L (ref 135–145)
Total Bilirubin: 0.5 mg/dL (ref 0.2–1.2)
Total Protein: 7.2 g/dL (ref 6.0–8.3)

## 2024-01-01 DIAGNOSIS — E042 Nontoxic multinodular goiter: Secondary | ICD-10-CM | POA: Insufficient documentation

## 2024-01-02 NOTE — Assessment & Plan Note (Signed)
 Followed by pulmonary  Reviewed her last CT  May elect early CT in light of the pain she is having

## 2024-01-03 ENCOUNTER — Ambulatory Visit (INDEPENDENT_AMBULATORY_CARE_PROVIDER_SITE_OTHER): Admitting: Family Medicine

## 2024-01-03 ENCOUNTER — Telehealth: Payer: Self-pay

## 2024-01-03 ENCOUNTER — Encounter: Payer: Self-pay | Admitting: Family Medicine

## 2024-01-03 ENCOUNTER — Encounter: Payer: Self-pay | Admitting: Physician Assistant

## 2024-01-03 ENCOUNTER — Ambulatory Visit: Admitting: Cardiology

## 2024-01-03 ENCOUNTER — Other Ambulatory Visit (HOSPITAL_COMMUNITY): Payer: Self-pay

## 2024-01-03 VITALS — BP 136/74 | HR 85 | Temp 98.2°F | Ht 64.25 in | Wt 107.4 lb

## 2024-01-03 DIAGNOSIS — Z0001 Encounter for general adult medical examination with abnormal findings: Secondary | ICD-10-CM | POA: Diagnosis not present

## 2024-01-03 DIAGNOSIS — Z7984 Long term (current) use of oral hypoglycemic drugs: Secondary | ICD-10-CM

## 2024-01-03 DIAGNOSIS — E559 Vitamin D deficiency, unspecified: Secondary | ICD-10-CM | POA: Diagnosis not present

## 2024-01-03 DIAGNOSIS — E785 Hyperlipidemia, unspecified: Secondary | ICD-10-CM | POA: Diagnosis not present

## 2024-01-03 DIAGNOSIS — Z0000 Encounter for general adult medical examination without abnormal findings: Secondary | ICD-10-CM

## 2024-01-03 DIAGNOSIS — E119 Type 2 diabetes mellitus without complications: Secondary | ICD-10-CM | POA: Insufficient documentation

## 2024-01-03 DIAGNOSIS — M8580 Other specified disorders of bone density and structure, unspecified site: Secondary | ICD-10-CM | POA: Diagnosis not present

## 2024-01-03 DIAGNOSIS — E1169 Type 2 diabetes mellitus with other specified complication: Secondary | ICD-10-CM

## 2024-01-03 DIAGNOSIS — F418 Other specified anxiety disorders: Secondary | ICD-10-CM | POA: Diagnosis not present

## 2024-01-03 DIAGNOSIS — F17200 Nicotine dependence, unspecified, uncomplicated: Secondary | ICD-10-CM | POA: Diagnosis not present

## 2024-01-03 DIAGNOSIS — R109 Unspecified abdominal pain: Secondary | ICD-10-CM

## 2024-01-03 DIAGNOSIS — D500 Iron deficiency anemia secondary to blood loss (chronic): Secondary | ICD-10-CM

## 2024-01-03 DIAGNOSIS — R7303 Prediabetes: Secondary | ICD-10-CM

## 2024-01-03 DIAGNOSIS — E042 Nontoxic multinodular goiter: Secondary | ICD-10-CM | POA: Diagnosis not present

## 2024-01-03 DIAGNOSIS — R0789 Other chest pain: Secondary | ICD-10-CM | POA: Diagnosis not present

## 2024-01-03 DIAGNOSIS — Z1211 Encounter for screening for malignant neoplasm of colon: Secondary | ICD-10-CM

## 2024-01-03 DIAGNOSIS — Z79899 Other long term (current) drug therapy: Secondary | ICD-10-CM

## 2024-01-03 DIAGNOSIS — Z1231 Encounter for screening mammogram for malignant neoplasm of breast: Secondary | ICD-10-CM | POA: Insufficient documentation

## 2024-01-03 MED ORDER — TRAMADOL HCL 50 MG PO TABS: 50.0000 mg | ORAL_TABLET | Freq: Three times a day (TID) | ORAL | 0 refills | Status: DC | PRN

## 2024-01-03 NOTE — Assessment & Plan Note (Signed)
 Disc goals for lipids and reasons to control them Rev last labs with pt Rev low sat fat diet in detail LDL is 104 No statin in light of cirrhosis  Trig up possibly due to elevated glucose

## 2024-01-03 NOTE — Assessment & Plan Note (Addendum)
 Continues psychiatric care  More stressed with recent pain issues Taking Tranxene   Pristiq 

## 2024-01-03 NOTE — Assessment & Plan Note (Signed)
 Abd/side/chest wall Improved/now intermittent now  Tramadol  prn  Pt declines pain referral as this has improved-will monitor Reviewed TS film and CT chest also cxr, abd film Unsure of cause

## 2024-01-03 NOTE — Telephone Encounter (Signed)
Plz submit PA for tramadol 50 mg tab.

## 2024-01-03 NOTE — Assessment & Plan Note (Signed)
 Disc in detail risks of smoking and possible outcomes including copd, vascular/ heart disease, cancer , respiratory and sinus infections as well as osteoporosis  Pt voices understanding Pt has lung cancer in past and also copd Is not interested in quitting Due for pulmonary follow up -she plans to schedule that

## 2024-01-03 NOTE — Telephone Encounter (Signed)
 Pharmacy Patient Advocate Encounter   Received notification from Pt Calls Messages that prior authorization for Tramadol  50 is required/requested.   Insurance verification completed.   The patient is insured through [facility] .   Spoke with [facility] [place] pt paid $13.99 with discount card. No refills, PA not needed.

## 2024-01-03 NOTE — Assessment & Plan Note (Signed)
 Reviewed health habits including diet and exercise and skin cancer prevention Reviewed appropriate screening tests for age  Also reviewed health mt list, fam hx and immunization status , as well as social and family history   See HPI Labs reviewed and ordered Health Maintenance  Topic Date Due   Complete foot exam   12/29/2023   Flu Shot  07/10/2024*   COVID-19 Vaccine (5 - 2025-26 season) 12/29/2025*   Breast Cancer Screening  12/28/2032*   Hemoglobin A1C  06/12/2024   Yearly kidney health urinalysis for diabetes  07/18/2024   Eye exam for diabetics  10/10/2024   Yearly kidney function blood test for diabetes  12/26/2024   Medicare Annual Wellness Visit  01/02/2025   Colon Cancer Screening  07/28/2025   DTaP/Tdap/Td vaccine (4 - Td or Tdap) 01/30/2033   Pneumococcal Vaccine for age over 76  Completed   DEXA scan (bone density measurement)  Completed   Hepatitis C Screening  Completed   Zoster (Shingles) Vaccine  Completed   HPV Vaccine  Aged Out   Meningitis B Vaccine  Aged Out   Hepatitis B Vaccine  Discontinued  *Topic was postponed. The date shown is not the original due date.    Mammogram ordered (pt changed mind)  Pt wants to put off flu shot until next month  .Discussed fall prevention, supplements and exercise for bone density  Dexa due in march Encouraged to quit smoking  PHQ 12- up / in setting of pain/ under psychiatric care, encouraged follow up

## 2024-01-03 NOTE — Assessment & Plan Note (Signed)
 Dexa will be due in march  No new falls or fractures Continues to smoke   Discussed fall prevention, supplements and exercise for bone density   D level is normal

## 2024-01-03 NOTE — Assessment & Plan Note (Signed)
 Under care of GI and hematology  Lab Results  Component Value Date   WBC 8.2 12/27/2023   HGB 15.0 12/27/2023   HCT 44.3 12/27/2023   MCV 97.1 12/27/2023   PLT 232.0 12/27/2023   Normal cbc today

## 2024-01-03 NOTE — Progress Notes (Signed)
 Subjective:    Patient ID: Christine Barton, female    DOB: 30-Jul-1950, 73 y.o.   MRN: [id number]  HPI  Here for health maintenance exam and to review chronic medical problems   Wt Readings from Last 3 Encounters:  01/03/24 107 lb 6 oz (48.7 kg)  12/22/23 107 lb 8 oz (48.8 kg)  12/14/23 107 lb 6 oz (48.7 kg)   18.29 kg/m  Vitals:   01/03/24 1119  BP: 136/74  Pulse: 85  Temp: 98.2 F (36.8 C)  SpO2: 95%    Immunization History  Administered Date(s) Administered   Fluad Quad(high Dose 65+) 12/04/2018, 01/19/2020, 12/23/2021   Fluad Trivalent(High Dose 65+) 12/06/2022   Hep A / Hep B 09/05/2018, 10/10/2018, 03/29/2019   INFLUENZA, HIGH DOSE SEASONAL PF 02/03/2021   Influenza Split 03/10/2011   Influenza Whole 02/11/2000, 02/06/2016   Influenza,inj,Quad PF,6+ Mos 02/16/2017, 04/18/2018   Influenza-Unspecified 02/11/2016   PFIZER(Purple Top)SARS-COV-2 Vaccination 05/21/2019, 06/16/2019, 02/13/2020   Pfizer Covid-19 Vaccine Bivalent Booster 29yrs & up 02/10/2021   Pneumococcal Conjugate-13 10/26/2016   Pneumococcal Polysaccharide-23 06/28/2008, 11/07/2017   Respiratory Syncytial Virus Vaccine,Recomb Aduvanted(Arexvy) 05/18/2022   Td 05/21/2002, 01/31/2023   Tdap 09/12/2013   Zoster Recombinant(Shingrix) 06/18/2022, 02/03/2023   Zoster, Live 08/24/2011    Health Maintenance Due  Topic Date Due   FOOT EXAM  12/29/2023   Flu shot -wants to wait till next month   Mammogram 11/2021  Self breast exam-no lumps  Personal history of breast cancer    Gyn health No symptoms    Colon cancer screening  Colonoscopy 07/2022    Bone health  Dexa  06/2022  osteopenia  Falls- none  Fractures-none  Supplements  vitamin D  (cut dose with high level in past)  Last vitamin D  Lab Results  Component Value Date   VD25OH 92.49 12/27/2023    Exercise  Some walking   Smoking status  1 ppd  Not ready to quit/no intention to quit   No change in breathing    Mood     01/03/2024   12:18 PM 12/22/2023   12:39 PM 07/22/2023    2:35 PM 04/01/2023    2:31 PM 01/31/2023   12:50 PM  Depression screen PHQ 2/9  Decreased Interest 2 0 0 0 0  Down, Depressed, Hopeless 1 0 0 0 0  PHQ - 2 Score 3 0 0 0 0  Altered sleeping 1 0 0  0  Tired, decreased energy 3 0 1  1  Change in appetite 3 0 1  0  Feeling bad or failure about yourself  1 0 0  0  Trouble concentrating 1 0 0  0  Moving slowly or fidgety/restless 0 0 0  0  Suicidal thoughts 0 0 0  0  PHQ-9 Score 12 0 2  1  Difficult doing work/chores Somewhat difficult Not difficult at all Not difficult at all Not difficult at all Not difficult at all   Depression/anxiety  Psychiatric care Trenxene Pristiq   Current use of ppi Protonix  Lab Results  Component Value Date   VITAMINB12 324 12/27/2023    Cirrhosis Lab Results  Component Value Date   ALT 9 12/27/2023   AST 20 12/27/2023   ALKPHOS 76 12/27/2023   BILITOT 0.5 12/27/2023   Anemia Lab Results  Component Value Date   WBC 8.2 12/27/2023   HGB 15.0 12/27/2023   HCT 44.3 12/27/2023   MCV 97.1 12/27/2023   PLT 232.0 12/27/2023   Lab Results  Component Value Date   IRON  83 10/17/2023   TIBC 480 (H) 10/17/2023   FERRITIN 27 10/17/2023    Prediabetes A1c is in the diabetic range  Lab Results  Component Value Date   HGBA1C 6.7 (H) 12/14/2023   HGBA1C 5.7 (H) 07/22/2023   HGBA1C 5.8 12/16/2022   Elevated microalb in April  Still has some metformin  xr 500 mg daily-will re start it  Too much sugar in diet    Lab Results  Component Value Date   NA 135 12/27/2023   K 4.8 12/27/2023   CO2 32 12/27/2023   GLUCOSE 117 (H) 12/27/2023   BUN 14 12/27/2023   CREATININE 0.66 12/27/2023   CALCIUM  10.4 12/27/2023   GFR 87.26 12/27/2023   EGFR 93 07/22/2023   GFRNONAA >60 12/07/2023   Pain in left upper abd/chest wall  Has had CT and spine imaging  ? Cause  Tramadol  for pain   Pain is intermittent now -also some itching  Is better  today than it was last night  Last dose tramadol  Sunday night   Gradually getting better     Noted thyroid  nodules on Ct incidentally and Ultrasound was ordered   Cholesterol Lab Results  Component Value Date   CHOL 196 12/27/2023   CHOL 189 12/16/2022   CHOL 160 12/21/2021   Lab Results  Component Value Date   HDL 41.80 12/27/2023   HDL 40.50 12/16/2022   HDL 43.30 12/21/2021   Lab Results  Component Value Date   LDLCALC 104 (H) 12/27/2023   LDLCALC 108 (H) 12/16/2022   LDLCALC 94 12/21/2021   Lab Results  Component Value Date   TRIG 254.0 (H) 12/27/2023   TRIG 204.0 (H) 12/16/2022   TRIG 110.0 12/21/2021   Lab Results  Component Value Date   CHOLHDL 5 12/27/2023   CHOLHDL 5 12/16/2022   CHOLHDL 4 12/21/2021   Lab Results  Component Value Date   LDLDIRECT 83.0 10/31/2017   LDLDIRECT 145.1 10/10/2012   LDLDIRECT 151.4 07/21/2011   Per pt -cannot take statin due to cirrhosis  Seeing cardiology       Patient Active Problem List   Diagnosis Date Noted   Encounter for screening mammogram for breast cancer 01/03/2024   Diabetes mellitus treated with oral medication (HCC) 01/03/2024   Multiple thyroid  nodules 01/01/2024   Left lateral abdominal pain 12/14/2023   Left flank pain 12/14/2023   Depression with anxiety 07/22/2023   Microalbuminuria 07/22/2023   Food insecurity 01/25/2023   Current use of proton pump inhibitor 12/14/2022   Osteopenia 06/17/2022   Pulmonary emphysema (HCC) 09/03/2021   Agatston CAC score, >400 07/02/2021   Left-sided chest wall pain 06/04/2021   Lung cancer (HCC) 02/25/2021   Iron  deficiency anemia 01/01/2021   Iron  deficiency anemia due to chronic blood loss 11/06/2020   AVM (arteriovenous malformation) of colon    Benign neoplasm of colon    NASH (nonalcoholic steatohepatitis)    Malnutrition 07/25/2019   HSV infection 02/23/2019   Aortic atherosclerosis 01/22/2019   Coronary atherosclerosis 01/22/2019   Encounter  for screening for lung cancer 01/04/2019   Venous insufficiency 11/15/2018   Hyponatremia 11/07/2018   Esophageal varices without bleeding (HCC) 10/27/2018   Anemia 10/23/2018   Marasmus 07/26/2018   Poor balance 05/30/2018   Cirrhosis of liver (HCC) 04/24/2018   Gallstones 04/24/2018   Heme positive stool 04/24/2018   Smoker 11/07/2017   Estrogen deficiency 10/26/2016   Colon cancer screening 11/01/2014   Encounter for  routine gynecological examination 09/12/2013   Left shoulder pain 08/20/2011   Routine general medical examination at a health care facility 07/21/2011   Vitamin D  deficiency 08/04/2009   POSTMENOPAUSAL STATUS 08/04/2009   Hyperlipidemia associated with type 2 diabetes mellitus (HCC) 06/14/2008   DISC DISEASE, CERVICAL 03/30/2007   History of alcohol abuse 12/06/2006   Neuropathy of both feet 12/06/2006   DIVERTICULOSIS, COLON 12/06/2006   Fatty liver 12/06/2006   BREAST CANCER, HX OF 12/06/2006   Past Medical History:  Diagnosis Date   Alcohol abuse, unspecified    Breast cancer (HCC) 1998   Right   Cataract    left eye   Cervical spondylosis 2006   MRI   Degeneration of cervical intervertebral disc 2006   MRI   Diabetes mellitus without complication (HCC)    Diverticulosis of colon (without mention of hemorrhage)    Hyperpotassemia    Lung cancer (HCC) 03/2021   started radiation in January 2023, right side   Microscopic hematuria    Mononeuritis of unspecified site    Nonspecific abnormal results of liver function study    Other abnormal glucose    Other and unspecified hyperlipidemia    no per pt   Other chronic nonalcoholic liver disease    Personal history of chemotherapy    Personal history of malignant neoplasm of breast    Personal history of radiation therapy    Pneumothorax, acute    right, spontaneous   Tobacco use disorder    Unspecified vitamin D  deficiency    Past Surgical History:  Procedure Laterality Date   BIOPSY  07/27/2019    Procedure: BIOPSY;  Surgeon: Leigh Elspeth SQUIBB, MD;  Location: [facility];  Service: Gastroenterology;;   BREAST BIOPSY  9/03   Right   BREAST LUMPECTOMY Right 1998   CHEST TUBE INSERTION  11/02/2014   COLONOSCOPY     COLONOSCOPY WITH PROPOFOL  N/A 07/27/2019   Procedure: COLONOSCOPY WITH PROPOFOL ;  Surgeon: Leigh Elspeth SQUIBB, MD;  Location: [facility];  Service: Gastroenterology;  Laterality: N/A;   COLONOSCOPY WITH PROPOFOL  N/A 07/29/2022   Procedure: COLONOSCOPY WITH PROPOFOL ;  Surgeon: Shila Gustav GAILS, MD;  Location: [facility];  Service: Gastroenterology;  Laterality: N/A;   ESOPHAGOGASTRODUODENOSCOPY (EGD) WITH PROPOFOL  N/A 10/30/2018   Procedure: ESOPHAGOGASTRODUODENOSCOPY (EGD) WITH PROPOFOL ;  Surgeon: Shila Gustav GAILS, MD;  Location: [facility];  Service: Endoscopy;  Laterality: N/A;   ESOPHAGOGASTRODUODENOSCOPY (EGD) WITH PROPOFOL  N/A 03/12/2019   Procedure: ESOPHAGOGASTRODUODENOSCOPY (EGD) WITH PROPOFOL ;  Surgeon: Shila Gustav GAILS, MD;  Location: [facility];  Service: Endoscopy;  Laterality: N/A;   ESOPHAGOGASTRODUODENOSCOPY (EGD) WITH PROPOFOL  N/A 07/27/2019   Procedure: ESOPHAGOGASTRODUODENOSCOPY (EGD) WITH PROPOFOL ;  Surgeon: Leigh Elspeth SQUIBB, MD;  Location: [facility];  Service: Gastroenterology;  Laterality: N/A;   EYE SURGERY  02/2017   cataract extraction with lens implant-left   HEMOSTASIS CLIP PLACEMENT  07/29/2022   Procedure: HEMOSTASIS CLIP PLACEMENT;  Surgeon: Shila Gustav GAILS, MD;  Location: [facility];  Service: Gastroenterology;;   HOT HEMOSTASIS N/A 07/27/2019   Procedure: HOT HEMOSTASIS (ARGON PLASMA COAGULATION/BICAP);  Surgeon: Leigh Elspeth SQUIBB, MD;  Location: [facility];  Service: Gastroenterology;  Laterality: N/A;   MOUTH SURGERY     POLYPECTOMY  07/27/2019   Procedure: POLYPECTOMY;  Surgeon: Leigh Elspeth SQUIBB, MD;  Location: [facility];  Service: Gastroenterology;;   POLYPECTOMY  07/29/2022   Procedure: POLYPECTOMY;   Surgeon: Shila Gustav GAILS, MD;  Location: [facility];  Service: Gastroenterology;;   TUBAL LIGATION  Social History   Tobacco Use   Smoking status: Every Day    Current packs/day: 1.00    Average packs/day: 1 pack/day for 59.2 years (59.2 ttl pk-yrs)    Types: Cigarettes    Start date: 09/25/2021    Passive exposure: Past   Smokeless tobacco: Never   Tobacco comments:    Started smoking at 73 years old.     1PPD 12/20/2022 khj  Vaping Use   Vaping status: Never Used  Substance Use Topics   Alcohol use: Not Currently   Drug use: No   Family History  Problem Relation Age of Onset   Heart failure Father    Heart attack Father    Colon cancer Maternal Uncle    Stroke Mother    Esophageal cancer Neg Hx    Rectal cancer Neg Hx    Stomach cancer Neg Hx    Pancreatic cancer Neg Hx    Allergies  Allergen Reactions   Anoro Ellipta  [Umeclidinium-Vilanterol] Cough   Bevespi  Aerosphere [Glycopyrrolate-Formoterol ] Cough    Prolonged coughing episode with first dose   Oxycodone  Nausea And Vomiting   Crestor  [Rosuvastatin ]     Abdominal pain     Glipizide  Other (See Comments)    Stomach pain   Losartan      Pt thinks it caused urinary retention     Spironolactone      Urinary retention     Current Outpatient Medications on File Prior to Visit  Medication Sig Dispense Refill   acetaminophen  (TYLENOL ) 500 MG tablet Take 500 mg by mouth every 6 (six) hours as needed for moderate pain.     albuterol  (VENTOLIN  HFA) 108 (90 Base) MCG/ACT inhaler INHALE 2 PUFFS INTO THE LUNGS EVERY 4 HOURS AS NEEDED FOR WHEEZE OR FOR SHORTNESS OF BREATH 8.5 each 2   cholecalciferol (VITAMIN D3) 25 MCG (1000 UNIT) tablet Take 1,000 Units by mouth daily.     clorazepate  (TRANXENE ) 3.75 MG tablet Take 1 tablet (3.75 mg total) by mouth 3 (three) times daily as needed for anxiety. 90 tablet 2   cyclobenzaprine  (FLEXERIL ) 10 MG tablet TAKE 1/2 TABLET BY MOUTH AT BEDTIME AS NEEDED FOR MUSCLE SPASMS 15  tablet 3   desvenlafaxine  (PRISTIQ ) 100 MG 24 hr tablet TAKE 1 TABLET BY MOUTH EVERY DAY 30 tablet 0   lactulose  (CHRONULAC ) 10 GM/15ML solution Take 15 mLs (10 g total) by mouth 2 (two) times daily as needed for severe constipation. 900 mL 3   metFORMIN  (GLUCOPHAGE -XR) 500 MG 24 hr tablet Take 500 mg by mouth daily with breakfast.     Multiple Vitamin (MULTIVITAMIN) tablet Take 1 tablet by mouth daily.     Multiple Vitamins-Minerals (PRESERVISION AREDS 2) CAPS Take 1 capsule by mouth 2 (two) times daily.     mupirocin  ointment (BACTROBAN ) 2 % APPLY 1 APPLICATION TOPICALLY 2 TIMES DAILY AS NEEDED. 15 g 3   nadolol  (CORGARD ) 20 MG tablet Take 1 tablet (20 mg total) by mouth daily. 90 tablet 3   nystatin  (MYCOSTATIN ) 100000 UNIT/ML suspension TAKE 5 MLS (500,000 UNITS TOTAL) BY MOUTH 3 (THREE) TIMES DAILY. SWISH AND SWALLOW 120 mL 0   pantoprazole  (PROTONIX ) 40 MG tablet TAKE 1 TABLET (40 MG TOTAL) BY MOUTH TWICE A DAY BEFORE MEALS 180 tablet 1   rifaximin  (XIFAXAN ) 550 MG TABS tablet Take 1 tablet (550 mg total) by mouth 2 (two) times daily. 180 tablet 3   SYSTANE COMPLETE 0.6 % SOLN Apply 1 drop to eye 2 (two) times daily.  Tiotropium Bromide Monohydrate  (SPIRIVA  RESPIMAT) 2.5 MCG/ACT AERS INHALE 2 PUFFS BY MOUTH INTO THE LUNGS DAILY 4 g 11   ursodiol  (ACTIGALL ) 300 MG capsule TAKE 1 CAPSULE BY MOUTH EVERY DAY 90 capsule 3   No current facility-administered medications on file prior to visit.    Review of Systems  Constitutional:  Positive for fatigue. Negative for activity change, appetite change, fever and unexpected weight change.  HENT:  Negative for congestion, ear pain, rhinorrhea, sinus pressure and sore throat.   Eyes:  Negative for pain, redness and visual disturbance.  Respiratory:  Negative for cough, shortness of breath and wheezing.   Cardiovascular:  Negative for chest pain and palpitations.  Gastrointestinal:  Negative for abdominal pain, blood in stool, constipation and  diarrhea.  Endocrine: Negative for polydipsia and polyuria.  Genitourinary:  Negative for dysuria, frequency and urgency.  Musculoskeletal:  Negative for arthralgias, back pain and myalgias.       Intermittent left side and back/rib pain  Quiet today     Skin:  Negative for pallor and rash.  Allergic/Immunologic: Negative for environmental allergies.  Neurological:  Negative for dizziness, syncope and headaches.  Hematological:  Negative for adenopathy. Does not bruise/bleed easily.  Psychiatric/Behavioral:  Positive for dysphoric mood. Negative for decreased concentration. The patient is not nervous/anxious.        Objective:   Physical Exam Constitutional:      General: She is not in acute distress.    Appearance: Normal appearance. She is well-developed. She is not ill-appearing or diaphoretic.     Comments: Underweight   HENT:     Head: Normocephalic and atraumatic.     Right Ear: Tympanic membrane, ear canal and external ear normal.     Left Ear: Tympanic membrane, ear canal and external ear normal.     Nose: Nose normal. No congestion.     Mouth/Throat:     Mouth: Mucous membranes are moist.     Pharynx: Oropharynx is clear. No posterior oropharyngeal erythema.  Eyes:     General: No scleral icterus.    Extraocular Movements: Extraocular movements intact.     Conjunctiva/sclera: Conjunctivae normal.     Pupils: Pupils are equal, round, and reactive to light.  Neck:     Thyroid : No thyromegaly.     Vascular: No carotid bruit or JVD.  Cardiovascular:     Rate and Rhythm: Normal rate and regular rhythm.     Pulses: Normal pulses.     Heart sounds: Normal heart sounds.     No gallop.  Pulmonary:     Effort: Pulmonary effort is normal. No respiratory distress.     Breath sounds: Normal breath sounds. No wheezing.     Comments: Good air exch Chest:     Chest wall: No tenderness.  Abdominal:     General: Bowel sounds are normal. There is no distension or abdominal  bruit.     Palpations: Abdomen is soft. There is no mass.     Tenderness: There is no abdominal tenderness.     Hernia: No hernia is present.  Genitourinary:    Comments: Breast exam: No mass, nodules, thickening, tenderness, bulging, retraction, inflamation, nipple discharge or skin changes noted.  No axillary or clavicular LA.     Baseline surgical changes on right  Musculoskeletal:        General: No tenderness. Normal range of motion.     Cervical back: Normal range of motion and neck supple. No rigidity. No muscular tenderness.  Right lower leg: No edema.     Left lower leg: No edema.     Comments: No kyphosis   Lymphadenopathy:     Cervical: No cervical adenopathy.  Skin:    General: Skin is warm and dry.     Coloration: Skin is not jaundiced or pale.     Findings: No bruising, erythema or rash.     Comments: Solar lentigines diffusely   Neurological:     Mental Status: She is alert. Mental status is at baseline.     Cranial Nerves: No cranial nerve deficit.     Motor: No abnormal muscle tone.     Coordination: Coordination normal.     Gait: Gait normal.     Deep Tendon Reflexes: Reflexes are normal and symmetric. Reflexes normal.  Psychiatric:        Mood and Affect: Mood normal.        Cognition and Memory: Cognition and memory normal.           Assessment & Plan:   Problem List Items Addressed This Visit       Endocrine   Multiple thyroid  nodules   Incidental on CT Us  scheduled       Hyperlipidemia associated with type 2 diabetes mellitus (HCC)   Disc goals for lipids and reasons to control them Rev last labs with pt Rev low sat fat diet in detail LDL is 104 No statin in light of cirrhosis  Trig up possibly due to elevated glucose       Relevant Medications   metFORMIN  (GLUCOPHAGE -XR) 500 MG 24 hr tablet   Diabetes mellitus treated with oral medication (HCC)   Lab Results  Component Value Date   HGBA1C 6.7 (H) 12/14/2023   HGBA1C 5.7 (H)  07/22/2023   HGBA1C 5.8 12/16/2022   Back into mild dm range Will start back on metformin  xr 500 mg daily  Encouraged change from sugary foods to more protein  Follow up 3 mo  Microalb utd and was elevated last time-watching  No statin due to cirrhosis       Relevant Medications   metFORMIN  (GLUCOPHAGE -XR) 500 MG 24 hr tablet     Musculoskeletal and Integument   Osteopenia   Dexa will be due in march  No new falls or fractures Continues to smoke   Discussed fall prevention, supplements and exercise for bone density   D level is normal         Other   Vitamin D  deficiency   Vitamin D  level is therapeutic with current supplementation Disc importance of this to bone and overall health Last vitamin D  Lab Results  Component Value Date   VD25OH 92.49 12/27/2023         Smoker   Disc in detail risks of smoking and possible outcomes including copd, vascular/ heart disease, cancer , respiratory and sinus infections as well as osteoporosis  Pt voices understanding Pt has lung cancer in past and also copd Is not interested in quitting Due for pulmonary follow up -she plans to schedule that       Routine general medical examination at a health care facility - Primary   Reviewed health habits including diet and exercise and skin cancer prevention Reviewed appropriate screening tests for age  Also reviewed health mt list, fam hx and immunization status , as well as social and family history   See HPI Labs reviewed and ordered Health Maintenance  Topic Date Due   Complete foot exam  12/29/2023   Flu Shot  07/10/2024*   COVID-19 Vaccine (5 - 2025-26 season) 12/29/2025*   Breast Cancer Screening  12/28/2032*   Hemoglobin A1C  06/12/2024   Yearly kidney health urinalysis for diabetes  07/18/2024   Eye exam for diabetics  10/10/2024   Yearly kidney function blood test for diabetes  12/26/2024   Medicare Annual Wellness Visit  01/02/2025   Colon Cancer Screening   07/28/2025   DTaP/Tdap/Td vaccine (4 - Td or Tdap) 01/30/2033   Pneumococcal Vaccine for age over 50  Completed   DEXA scan (bone density measurement)  Completed   Hepatitis C Screening  Completed   Zoster (Shingles) Vaccine  Completed   HPV Vaccine  Aged Out   Meningitis B Vaccine  Aged Out   Hepatitis B Vaccine  Discontinued  *Topic was postponed. The date shown is not the original due date.    Mammogram ordered (pt changed mind)  Pt wants to put off flu shot until next month  .Discussed fall prevention, supplements and exercise for bone density  Dexa due in march Encouraged to quit smoking  PHQ 12- up / in setting of pain/ under psychiatric care, encouraged follow up       RESOLVED: Prediabetes   Left-sided chest wall pain   See a/p for abd pain  Reassuring CT chest, abd xray Gradually improving  Normal breast exam  Consider ref to pain management if this worsens Taking tramadol  prn       Left lateral abdominal pain   Abd/side/chest wall Improved/now intermittent now  Tramadol  prn  Pt declines pain referral as this has improved-will monitor Reviewed TS film and CT chest also cxr, abd film Unsure of cause       Iron  deficiency anemia due to chronic blood loss   Under care of GI and hematology  Lab Results  Component Value Date   WBC 8.2 12/27/2023   HGB 15.0 12/27/2023   HCT 44.3 12/27/2023   MCV 97.1 12/27/2023   PLT 232.0 12/27/2023   Normal cbc today      Encounter for screening mammogram for breast cancer   Due for mammogram Changed mind about screening and wants to get if affordable Asked pt to call if she cannot afford this   Do not think left side/rib pain is breast related   Does have history of breast cancer on right in past       Relevant Orders   MM 3D SCREENING MAMMOGRAM BILATERAL BREAST   Depression with anxiety   Continues psychiatric care  More stressed with recent pain issues Taking Tranxene   Pristiq         Current use of  proton pump inhibitor   Lab Results  Component Value Date   VITAMINB12 324 12/27/2023   Continues GI care       Colon cancer screening   Colonoscopy 07/2022 with 3 y recall if well enough

## 2024-01-03 NOTE — Assessment & Plan Note (Signed)
Colonoscopy 07/2022 with 3 y recall if well enough

## 2024-01-03 NOTE — Assessment & Plan Note (Signed)
 Lab Results  Component Value Date   VITAMINB12 324 12/27/2023   Continues GI care

## 2024-01-03 NOTE — Assessment & Plan Note (Signed)
 Due for mammogram Changed mind about screening and wants to get if affordable Asked pt to call if she cannot afford this   Do not think left side/rib pain is breast related   Does have history of breast cancer on right in past

## 2024-01-03 NOTE — Assessment & Plan Note (Signed)
 Lab Results  Component Value Date   HGBA1C 6.7 (H) 12/14/2023   HGBA1C 5.7 (H) 07/22/2023   HGBA1C 5.8 12/16/2022   Back into mild dm range Will start back on metformin  xr 500 mg daily  Encouraged change from sugary foods to more protein  Follow up 3 mo  Microalb utd and was elevated last time-watching  No statin due to cirrhosis

## 2024-01-03 NOTE — Assessment & Plan Note (Signed)
 See a/p for abd pain  Reassuring CT chest, abd xray Gradually improving  Normal breast exam  Consider ref to pain management if this worsens Taking tramadol  prn

## 2024-01-03 NOTE — Telephone Encounter (Signed)
 Copied from CRM #[id number]. Topic: Clinical - Prescription Issue >> Jan 03, 2024 12:54 PM [place] A wrote: Reason for CRM: Patient called said Pharmacy told her to call Primary because the Tramodol 50 MG will need a Prior Authorization for Insurance to cover it.

## 2024-01-03 NOTE — Assessment & Plan Note (Signed)
 Vitamin D  level is therapeutic with current supplementation Disc importance of this to bone and overall health Last vitamin D  Lab Results  Component Value Date   VD25OH 92.49 12/27/2023

## 2024-01-03 NOTE — Assessment & Plan Note (Signed)
 Incidental on CT Us  scheduled

## 2024-01-03 NOTE — Patient Instructions (Addendum)
 Call the pulmonary office (Dr Lenda) to schedule a follow up appointment (I think you are due)   Your glucose is back into the diabetic range Start back on metformin  xr 500 mg daily   Avoid sugary foods and swap them for protein  The following are examples of protein in diet  Meat  Fish  Eggs  Dairy products  Soy products  Oat milk  Almond milk Nuts and nut butters  Dried beans    If your side pain persists for another week let us  know  Glad it is gradually getting better  If not -then I want to refer you to a pain management center   Follow up in 3 months    Call to schedule your mammogram If you cannot afford it let us  know  You have an order for:  [x]   3D Mammogram  []   Bone Density     Please call for appointment:   []   [facility]  [address] [place] [address]  [phone]  []   [facility])   [address]  [place], [place] [address]  [phone]  [x]   [facility]      [address] [place], [place]        [phone]         []   [facility]  [address] [place], [place]  [phone]  []  [facility]   [address]   [place], [place] [address]  [phone]  []  [facility]  [address] [place], [place] [address] [phone]    Make sure to wear two piece clothing  No lotions powders or deodorants the day of the appointment Make sure to bring picture ID and insurance card.  Bring list of medications you are currently taking including any supplements.   Schedule your screening mammogram through MyChart!   Select [facility] imaging sites can now be scheduled through MyChart.  Log into your MyChart account.  Go to 'Visit' (or 'Appointments' if  on mobile App) --> Schedule  an  Appointment  Under 'Select a Reason for Visit' choose the Mammogram  Screening option.  Complete the pre-visit questions  and select the time and place that  best fits your schedule

## 2024-01-03 NOTE — Telephone Encounter (Addendum)
 PA request has been Received. New Encounter has been or will be created for follow up. For additional info see Pharmacy Prior Auth telephone encounter from 01/03/24.

## 2024-01-04 NOTE — Telephone Encounter (Signed)
 Noted (see 01/03/24 phn note).

## 2024-01-05 ENCOUNTER — Ambulatory Visit: Attending: Cardiology | Admitting: Cardiology

## 2024-01-05 ENCOUNTER — Encounter: Payer: Self-pay | Admitting: Cardiology

## 2024-01-05 VITALS — BP 128/53 | HR 77 | Ht 64.0 in | Wt 109.2 lb

## 2024-01-05 DIAGNOSIS — Z720 Tobacco use: Secondary | ICD-10-CM

## 2024-01-05 DIAGNOSIS — I2510 Atherosclerotic heart disease of native coronary artery without angina pectoris: Secondary | ICD-10-CM | POA: Diagnosis not present

## 2024-01-05 DIAGNOSIS — R079 Chest pain, unspecified: Secondary | ICD-10-CM

## 2024-01-05 NOTE — Progress Notes (Signed)
 Cardiology Office Note:    Date:  01/05/2024   ID:  Christine Barton, DOB 11/05/1950, MRN [id number]  PCP:  Randeen Laine LABOR, MD  Cardiologist:  Lonni LITTIE Nanas, MD  Electrophysiologist:  None   Referring MD: Randeen Laine LABOR, MD   Chief Complaint  Patient presents with   Coronary Artery Disease    History of Present Illness:    Christine Barton is a 73 y.o. female with a hx of cirrhosis c/b esophageal varices, tobacco use, hyperlipidemia, type 2 diabetes who presents for follow-up.  She was referred by Dr. Randeen for evaluation of coronary artery calcifications seen on chest CT, with initial appointment on 02/13/2019.  She underwent a chest CT for lung cancer screening on 01/18/2019, which showed multivessel coronary artery calcifications.  Patient denies any chest pain.  States that her activity has been limited by back pain but has been doing physical therapy.  She does report intermittent dyspnea with exertion, which she states improves with inhaler use.  She continues to smoke 1 pack/day.  Had been taken off diuretics but had to restart.  TTE on 02/21/2019 showed normal LV systolic function, normal RV function, no significant valvular disease.  Lexiscan  Myoview  on 02/15/2020 showed EF 55%, normal perfusion.  Continued to have chest pain and coronary CTA was done on 06/08/2021, which showed calcium  score 598 (94th percentile), diffuse calcified plaque causing mild stenosis, moderate stenosis in diagonal branch; CT FFR showed no significant stenosis.  At appointment 12/07/2023 she reported active chest pain and was taken to the ED.  Workup unremarkable  Since her last clinic visit, she reports she is doing okay.  Reports her chest pain has improved but continues to have intermittent chest pain.  Denies any shortness of breath.  She continues to smoke 1 pack/day.  Past Medical History:  Diagnosis Date   Alcohol abuse, unspecified    Breast cancer (HCC) 1998   Right   Cataract    left eye    Cervical spondylosis 2006   MRI   Degeneration of cervical intervertebral disc 2006   MRI   Diabetes mellitus without complication (HCC)    Diverticulosis of colon (without mention of hemorrhage)    Hyperpotassemia    Lung cancer (HCC) 03/2021   started radiation in January 2023, right side   Microscopic hematuria    Mononeuritis of unspecified site    Nonspecific abnormal results of liver function study    Other abnormal glucose    Other and unspecified hyperlipidemia    no per pt   Other chronic nonalcoholic liver disease    Personal history of chemotherapy    Personal history of malignant neoplasm of breast    Personal history of radiation therapy    Pneumothorax, acute    right, spontaneous   Tobacco use disorder    Unspecified vitamin D  deficiency     Past Surgical History:  Procedure Laterality Date   BIOPSY  07/27/2019   Procedure: BIOPSY;  Surgeon: Leigh Elspeth SQUIBB, MD;  Location: [facility];  Service: Gastroenterology;;   BREAST BIOPSY  9/03   Right   BREAST LUMPECTOMY Right 1998   CHEST TUBE INSERTION  11/02/2014   COLONOSCOPY     COLONOSCOPY WITH PROPOFOL  N/A 07/27/2019   Procedure: COLONOSCOPY WITH PROPOFOL ;  Surgeon: Leigh Elspeth SQUIBB, MD;  Location: [facility];  Service: Gastroenterology;  Laterality: N/A;   COLONOSCOPY WITH PROPOFOL  N/A 07/29/2022   Procedure: COLONOSCOPY WITH PROPOFOL ;  Surgeon: Shila Gustav GAILS, MD;  Location: [facility];  Service: Gastroenterology;  Laterality: N/A;   ESOPHAGOGASTRODUODENOSCOPY (EGD) WITH PROPOFOL  N/A 10/30/2018   Procedure: ESOPHAGOGASTRODUODENOSCOPY (EGD) WITH PROPOFOL ;  Surgeon: Shila Gustav GAILS, MD;  Location: [facility];  Service: Endoscopy;  Laterality: N/A;   ESOPHAGOGASTRODUODENOSCOPY (EGD) WITH PROPOFOL  N/A 03/12/2019   Procedure: ESOPHAGOGASTRODUODENOSCOPY (EGD) WITH PROPOFOL ;  Surgeon: Shila Gustav GAILS, MD;  Location: [facility];  Service: Endoscopy;  Laterality: N/A;    ESOPHAGOGASTRODUODENOSCOPY (EGD) WITH PROPOFOL  N/A 07/27/2019   Procedure: ESOPHAGOGASTRODUODENOSCOPY (EGD) WITH PROPOFOL ;  Surgeon: Leigh Elspeth SQUIBB, MD;  Location: [facility];  Service: Gastroenterology;  Laterality: N/A;   EYE SURGERY  02/2017   cataract extraction with lens implant-left   HEMOSTASIS CLIP PLACEMENT  07/29/2022   Procedure: HEMOSTASIS CLIP PLACEMENT;  Surgeon: Shila Gustav GAILS, MD;  Location: [facility];  Service: Gastroenterology;;   HOT HEMOSTASIS N/A 07/27/2019   Procedure: HOT HEMOSTASIS (ARGON PLASMA COAGULATION/BICAP);  Surgeon: Leigh Elspeth SQUIBB, MD;  Location: [facility];  Service: Gastroenterology;  Laterality: N/A;   MOUTH SURGERY     POLYPECTOMY  07/27/2019   Procedure: POLYPECTOMY;  Surgeon: Leigh Elspeth SQUIBB, MD;  Location: [facility];  Service: Gastroenterology;;   POLYPECTOMY  07/29/2022   Procedure: POLYPECTOMY;  Surgeon: Shila Gustav GAILS, MD;  Location: [facility];  Service: Gastroenterology;;   TUBAL LIGATION      Current Medications: Current Meds  Medication Sig   acetaminophen  (TYLENOL ) 500 MG tablet Take 500 mg by mouth every 6 (six) hours as needed for moderate pain.   albuterol  (VENTOLIN  HFA) 108 (90 Base) MCG/ACT inhaler INHALE 2 PUFFS INTO THE LUNGS EVERY 4 HOURS AS NEEDED FOR WHEEZE OR FOR SHORTNESS OF BREATH   cholecalciferol (VITAMIN D3) 25 MCG (1000 UNIT) tablet Take 1,000 Units by mouth daily.   clorazepate  (TRANXENE ) 3.75 MG tablet Take 1 tablet (3.75 mg total) by mouth 3 (three) times daily as needed for anxiety.   cyclobenzaprine  (FLEXERIL ) 10 MG tablet TAKE 1/2 TABLET BY MOUTH AT BEDTIME AS NEEDED FOR MUSCLE SPASMS   desvenlafaxine  (PRISTIQ ) 100 MG 24 hr tablet TAKE 1 TABLET BY MOUTH EVERY DAY   lactulose  (CHRONULAC ) 10 GM/15ML solution Take 15 mLs (10 g total) by mouth 2 (two) times daily as needed for severe constipation.   metFORMIN  (GLUCOPHAGE -XR) 500 MG 24 hr tablet Take 500 mg by mouth daily with breakfast.    Multiple Vitamin (MULTIVITAMIN) tablet Take 1 tablet by mouth daily.   Multiple Vitamins-Minerals (PRESERVISION AREDS 2) CAPS Take 1 capsule by mouth 2 (two) times daily.   mupirocin  ointment (BACTROBAN ) 2 % APPLY 1 APPLICATION TOPICALLY 2 TIMES DAILY AS NEEDED.   nadolol  (CORGARD ) 20 MG tablet Take 1 tablet (20 mg total) by mouth daily.   nystatin  (MYCOSTATIN ) 100000 UNIT/ML suspension TAKE 5 MLS (500,000 UNITS TOTAL) BY MOUTH 3 (THREE) TIMES DAILY. SWISH AND SWALLOW   pantoprazole  (PROTONIX ) 40 MG tablet TAKE 1 TABLET (40 MG TOTAL) BY MOUTH TWICE A DAY BEFORE MEALS   rifaximin  (XIFAXAN ) 550 MG TABS tablet Take 1 tablet (550 mg total) by mouth 2 (two) times daily.   SYSTANE COMPLETE 0.6 % SOLN Apply 1 drop to eye 2 (two) times daily.   Tiotropium Bromide Monohydrate  (SPIRIVA  RESPIMAT) 2.5 MCG/ACT AERS INHALE 2 PUFFS BY MOUTH INTO THE LUNGS DAILY   traMADol  (ULTRAM ) 50 MG tablet Take 1 tablet (50 mg total) by mouth every 8 (eight) hours as needed for up to 5 days.   ursodiol  (ACTIGALL ) 300 MG capsule TAKE 1 CAPSULE BY MOUTH EVERY  DAY     Allergies:   Anoro ellipta  [umeclidinium-vilanterol], Bevespi  aerosphere [glycopyrrolate-formoterol ], Oxycodone , Crestor  [rosuvastatin ], Glipizide , Losartan , and Spironolactone    Social History   Socioeconomic History   Marital status: Single    Spouse name: Not on file   Number of children: 1   Years of education: Not on file   Highest education level: Not on file  Occupational History   Occupation: retired    Associate Professor: [employer]  Tobacco Use   Smoking status: Every Day    Current packs/day: 1.00    Average packs/day: 1 pack/day for 59.2 years (59.2 ttl pk-yrs)    Types: Cigarettes    Start date: 09/25/2021    Passive exposure: Past   Smokeless tobacco: Never   Tobacco comments:    Started smoking at 73 years old.     1PPD 12/20/2022 khj  Vaping Use   Vaping status: Never Used  Substance and Sexual Activity   Alcohol use: Not Currently    Drug use: No   Sexual activity: Not Currently  Other Topics Concern   Not on file  Social History Narrative   Divorced      1 child      Works at [employer]         Social Drivers of [employer]: Low Risk  (04/01/2023)   Overall Financial Resource Strain (CARDIA)    Difficulty of Paying Living Expenses: Not hard at all  Food Insecurity: No Food Insecurity (04/01/2023)   Hunger Vital Sign    Worried About Running Out of Food in the Last Year: Never true    Ran Out of Food in the Last Year: Never true  Recent Concern: Food Insecurity - Food Insecurity Present (03/03/2023)   Hunger Vital Sign    Worried About Running Out of Food in the Last Year: Never true    Ran Out of Food in the Last Year: Sometimes true  Transportation Needs: No Transportation Needs (04/01/2023)   PRAPARE - Administrator, Civil Service (Medical): No    Lack of Transportation (Non-Medical): No  Physical Activity: Inactive (04/01/2023)   Exercise Vital Sign    Days of Exercise per Week: 0 days    Minutes of Exercise per Session: 0 min  Stress: No Stress Concern Present (04/01/2023)   [employer] of Occupational Health - Occupational Stress Questionnaire    Feeling of Stress : Not at all  Social Connections: Unknown (04/01/2023)   Social Connection and Isolation Panel    Frequency of Communication with Friends and Family: Never    Frequency of Social Gatherings with Friends and Family: Never    Attends Religious Services: Never    Database administrator or Organizations: No    Attends Engineer, structural: Never    Marital Status: Not on file     Family History: The patient's \ family history includes Colon cancer in her maternal uncle; Heart attack in her father; Heart failure in her father; Stroke in her mother. There is no history of Esophageal cancer, Rectal cancer, Stomach cancer, or Pancreatic cancer.  ROS:   Please see the history of  present illness.    \All other systems reviewed and are negative.  EKGs/Labs/Other Studies Reviewed:    The following studies were reviewed today:   EKG:   05/26/2021: Normal sinus rhythm, rate 71, no ST abnormalities 12/07/2023: Normal sinus rhythm, rate 88, no ST abnormalities  Recent Labs: 12/27/2023: ALT 9; BUN  14; Creatinine, Ser 0.66; Hemoglobin 15.0; Platelets 232.0; Potassium 4.8; Sodium 135; TSH 2.55  Recent Lipid Panel    Component Value Date/Time   CHOL 196 12/27/2023 0913   TRIG 254.0 (H) 12/27/2023 0913   HDL 41.80 12/27/2023 0913   CHOLHDL 5 12/27/2023 0913   VLDL 50.8 (H) 12/27/2023 0913   LDLCALC 104 (H) 12/27/2023 0913   LDLDIRECT 83.0 10/31/2017 0838    Physical Exam:    VS:  BP (!) 128/53 (BP Location: Right Arm, Patient Position: Sitting, Cuff Size: Normal)   Pulse 77   Ht 5' 4 (1.626 m)   Wt 109 lb 3.2 oz (49.5 kg)   LMP  (LMP Unknown)   SpO2 97%   BMI 18.74 kg/m     Wt Readings from Last 3 Encounters:  01/05/24 109 lb 3.2 oz (49.5 kg)  01/03/24 107 lb 6 oz (48.7 kg)  12/22/23 107 lb 8 oz (48.8 kg)     GEN: Cachectic, in no acute distress HEENT: Normal NECK: No JVD CARDIAC: RRR, no murmurs RESPIRATORY:  Clear to auscultation without rales, wheezing or rhonchi  ABDOMEN: Soft, non-tender, non-distended MUSCULOSKELETAL:  No edema SKIN: Warm and dry NEUROLOGIC:  Alert and oriented x 3 PSYCHIATRIC:  Normal affect   ASSESSMENT:    1. Chest pain of uncertain etiology   2. Coronary artery disease involving native coronary artery of native heart, unspecified whether angina present   3. Tobacco use      PLAN:     Coronary artery disease: CT chest showed coronary calcifications.  Lexiscan  Myoview  on 02/15/2020 showed EF 55%, normal perfusion.  TTE on 02/21/2019 showed no structural heart disease.  Coronary CTA was done on 06/08/2021, which showed calcium  score 598 (94th percentile), diffuse calcified plaque causing mild stenosis, moderate  stenosis in diagonal branch; CT FFR showed no significant stenosis. -She appears compensated in regards to her cirrhosis.  Previously discussed with her hepatologist, Dr. Nandigam, and OK to start on statin.  Started on rosuvastatin  5 mg daily in 02/2019.  LDL 57 on 05/06/2020.  LFTs have been stable.  She stopped taking it because she thought statin was causing her abdominal pain.  Recommended restarting statin, patient declines, states that she will not take another statin.  Recommended referral to pharmacy lipid clinic to evaluate alternatives but did not f/u.  She is now agreeable to seeing pharmacy, will refer.  Most recent LDL 104 on 12/27/2023 -Would not start on daily aspirin given issues with GI bleeding - She is reporting atypical chest pain, recommend stress PET to evaluate for ischemia  Type 2 diabetes: A1c 6.7% on 12/14/2023  Tobacco use: Patient counseled on risks of tobacco use and cessation strongly encouraged.    Lung cancer: Found to have right upper lobe nodule that was positive on PET scan.  Not a surgical candidate.  Status post radiation in January 2023  Patient reports having difficulty affording her medical care, will ask social worker to reach out concerning available resources  RTC in 4 months  Informed Consent   Shared Decision Making/Informed Consent The risks [chest pain, shortness of breath, cardiac arrhythmias, dizziness, blood pressure fluctuations, myocardial infarction, stroke/transient ischemic attack, nausea, vomiting, allergic reaction, radiation exposure, metallic taste sensation and life-threatening complications (estimated to be 1 in 10,000)], benefits (risk stratification, diagnosing coronary artery disease, treatment guidance) and alternatives of a cardiac PET stress test were discussed in detail with Ms. Bolanos and she agrees to proceed.      Medication Adjustments/Labs and  Tests Ordered: Current medicines are reviewed at length with the patient today.   Concerns regarding medicines are outlined above.  Orders Placed This Encounter  Procedures   NM PET CT CARDIAC PERFUSION MULTI W/ABSOLUTE BLOODFLOW   AMB Referral to [facility] Pharm-D   Referral to HRT/VAS Care Navigation   Cardiac Stress Test: Informed Consent Details: Physician/Practitioner Attestation; Transcribe to consent form and obtain patient signature   No orders of the defined types were placed in this encounter.   Patient Instructions  Medication Instructions:  Your physician recommends that you continue on your current medications as directed. Please refer to the Current Medication list given to you today.  *If you need a refill on your cardiac medications before your next appointment, please call your pharmacy*  Lab Work: NONE  If you have labs (blood work) drawn today and your tests are completely normal, you will receive your results only by: MyChart Message (if you have MyChart) OR A paper copy in the mail If you have any lab test that is abnormal or we need to change your treatment, we will call you to review the results.  Testing/Procedures: Your Provider has requested that you  have a Stress Cardiac PET Scan.   Your physician has referred you to the Pharmacy clinic to help manage your Cholesterol.   Follow-Up: At [facility], you and your health needs are our priority.  As part of our continuing mission to provide you with exceptional heart care, our providers are all part of one team.  This team includes your primary Cardiologist (physician) and Advanced Practice Providers or APPs (Physician Assistants and Nurse Practitioners) who all work together to provide you with the care you need, when you need it.  Your next appointment:   4 month(s)  Provider:   Lonni LITTIE Nanas, MD     Other Instructions    Please report to Radiology at the [facility] Main Entrance 30 minutes early for your test.  [address] [place],  [place] [address]                         OR   Please report to Radiology at [facility] Main Entrance, medical mall, 30 mins prior to your test.  [address]  [place], [place]  How to Prepare for Your Cardiac PET/CT Stress Test:  Nothing to eat or drink, except water, 3 hours prior to arrival time.  NO caffeine/decaffeinated products, or chocolate 12 hours prior to arrival. (Please note decaffeinated beverages (teas/coffees) still contain caffeine).  If you have caffeine within 12 hours prior, the test will need to be rescheduled.  Medication instructions: Do not take erectile dysfunction medications for 72 hours prior to test (sildenafil, tadalafil) Do not take nitrates (isosorbide mononitrate, Ranexa) the day before or day of test Do not take tamsulosin the day before or morning of test Hold theophylline containing medications for 12 hours. Hold Dipyridamole 48 hours prior to the test.  Diabetic Preparation: If able to eat breakfast prior to 3 hour fasting, you may take all medications, including your insulin . Do not worry if you miss your breakfast dose of insulin  - start at your next meal. If you do not eat prior to 3 hour fast-Hold all diabetes (oral and insulin ) medications. Patients who wear a continuous glucose monitor MUST remove the device prior to scanning.  You may take your remaining medications with water.  NO perfume, cologne  or lotion on chest or abdomen area. FEMALES - Please avoid wearing dresses to this appointment.  Total time is 1 to 2 hours; you may want to bring reading material for the waiting time.  In preparation for your appointment, medication and supplies will be purchased.  Appointment availability is limited, so if you need to cancel or reschedule, please call the Radiology Department Scheduler at [phone] 24 hours in advance to avoid a cancellation fee of $100.00  What to Expect When you Arrive:  Once you arrive and check  in for your appointment, you will be taken to a preparation room within the Radiology Department.  A technologist or Nurse will obtain your medical history, verify that you are correctly prepped for the exam, and explain the procedure.  Afterwards, an IV will be started in your arm and electrodes will be placed on your skin for EKG monitoring during the stress portion of the exam. Then you will be escorted to the PET/CT scanner.  There, staff will get you positioned on the scanner and obtain a blood pressure and EKG.  During the exam, you will continue to be connected to the EKG and blood pressure machines.  A small, safe amount of a radioactive tracer will be injected in your IV to obtain a series of pictures of your heart along with an injection of a stress agent.    After your Exam:  It is recommended that you eat a meal and drink a caffeinated beverage to counter act any effects of the stress agent.  Drink plenty of fluids for the remainder of the day and urinate frequently for the first couple of hours after the exam.  Your doctor will inform you of your test results within 7-10 business days.  For more information and frequently asked questions, please visit our website: [email or web address]  For questions about your test or how to prepare for your test, please call: Cardiac Imaging Nurse Navigators Office: [phone]           Signed, Lonni LITTIE Nanas, MD  01/05/2024 5:49 PM    [facility]

## 2024-01-05 NOTE — Patient Instructions (Signed)
 Medication Instructions:  Your physician recommends that you continue on your current medications as directed. Please refer to the Current Medication list given to you today.  *If you need a refill on your cardiac medications before your next appointment, please call your pharmacy*  Lab Work: NONE  If you have labs (blood work) drawn today and your tests are completely normal, you will receive your results only by: MyChart Message (if you have MyChart) OR A paper copy in the mail If you have any lab test that is abnormal or we need to change your treatment, we will call you to review the results.  Testing/Procedures: Your Provider has requested that you  have a Stress Cardiac PET Scan.   Your physician has referred you to the Pharmacy clinic to help manage your Cholesterol.   Follow-Up: At [facility], you and your health needs are our priority.  As part of our continuing mission to provide you with exceptional heart care, our providers are all part of one team.  This team includes your primary Cardiologist (physician) and Advanced Practice Providers or APPs (Physician Assistants and Nurse Practitioners) who all work together to provide you with the care you need, when you need it.  Your next appointment:   4 month(s)  Provider:   Lonni LITTIE Nanas, MD     Other Instructions    Please report to Radiology at the [facility] Main Entrance 30 minutes early for your test.  [address] [place], [place] [address]                         OR   Please report to Radiology at [facility] Main Entrance, medical mall, 30 mins prior to your test.  [address]  [place], [place]  How to Prepare for Your Cardiac PET/CT Stress Test:  Nothing to eat or drink, except water, 3 hours prior to arrival time.  NO caffeine/decaffeinated products, or chocolate 12 hours prior to arrival. (Please note decaffeinated beverages (teas/coffees)  still contain caffeine).  If you have caffeine within 12 hours prior, the test will need to be rescheduled.  Medication instructions: Do not take erectile dysfunction medications for 72 hours prior to test (sildenafil, tadalafil) Do not take nitrates (isosorbide mononitrate, Ranexa) the day before or day of test Do not take tamsulosin the day before or morning of test Hold theophylline containing medications for 12 hours. Hold Dipyridamole 48 hours prior to the test.  Diabetic Preparation: If able to eat breakfast prior to 3 hour fasting, you may take all medications, including your insulin . Do not worry if you miss your breakfast dose of insulin  - start at your next meal. If you do not eat prior to 3 hour fast-Hold all diabetes (oral and insulin ) medications. Patients who wear a continuous glucose monitor MUST remove the device prior to scanning.  You may take your remaining medications with water.  NO perfume, cologne or lotion on chest or abdomen area. FEMALES - Please avoid wearing dresses to this appointment.  Total time is 1 to 2 hours; you may want to bring reading material for the waiting time.  In preparation for your appointment, medication and supplies will be purchased.  Appointment availability is limited, so if you need to cancel or reschedule, please call the Radiology Department Scheduler at [phone] 24 hours in advance to avoid a cancellation fee of $100.00  What to Expect When you Arrive:  Once you arrive and check in for your appointment, you will be taken to a preparation room within the Radiology Department.  A technologist or Nurse will obtain your medical history, verify that you are correctly prepped for the exam, and explain the procedure.  Afterwards, an IV will be started in your arm and electrodes will be placed on your skin for EKG monitoring during the stress portion of the exam. Then you will be escorted to the PET/CT scanner.  There, staff will get you  positioned on the scanner and obtain a blood pressure and EKG.  During the exam, you will continue to be connected to the EKG and blood pressure machines.  A small, safe amount of a radioactive tracer will be injected in your IV to obtain a series of pictures of your heart along with an injection of a stress agent.    After your Exam:  It is recommended that you eat a meal and drink a caffeinated beverage to counter act any effects of the stress agent.  Drink plenty of fluids for the remainder of the day and urinate frequently for the first couple of hours after the exam.  Your doctor will inform you of your test results within 7-10 business days.  For more information and frequently asked questions, please visit our website: [email or web address]  For questions about your test or how to prepare for your test, please call: Cardiac Imaging Nurse Navigators Office: [phone]

## 2024-01-06 ENCOUNTER — Telehealth: Payer: Self-pay | Admitting: Licensed Clinical Social Worker

## 2024-01-06 NOTE — Progress Notes (Signed)
 Heart and Vascular Care Navigation  01/06/2024  Christine Barton 08/15/1950 [id number]  Reason for Referral: financial concerns   Engaged with patient by telephone for initial visit for Heart and Vascular Care Coordination.                                                                                                   Assessment:                                     LCSW was able to reach pt today at [phone]. Introduced self, role, reason for call. Confirmed home address and DOB, pt resides alone. Denies any issues with transportation at this time, does share she struggles to make ends meet with her current social security income. Pt does receive Extra Help/LIS assistance with medication copays, she has Medicare Savings Program as well that covers her Part B premium. She owns her home, but has issues with utilities. Her medical bills are just an added stressor.   Pt not Medicaid eligible and does not have $5000 in outstanding bills currently so assistance through health system is limited. She elected to not re-apply for SNAP this year bc it was so low in award that it did not help ($23). She is receiving all Medicare assistance that she can for bills and medications. We discussed other ways to save funds including using local food resources, utility assistance and LIEAP when it opens in December (pending funding). Pt open to these being sent to her home address. No additional questions at this time, appreciative of call. Okay with me f/u as able.   HRT/VAS Care Coordination     Patients Home Cardiology Office [facility]   Outpatient Care Team Social Worker   Social Worker Name: Marit Lark, [place], [phone]   Living arrangements for the past 2 months Single Family Home   Lives with: Self   Patient Current Insurance Coverage Self-Pay   Patient Has Concern With Paying Medical Bills No   Does Patient Have Prescription Coverage? Yes   Home Assistive Devices/Equipment Cane  (specify quad or straight)       Social History:                                                                             SDOH Screenings   Food Insecurity: Food Insecurity Present (01/06/2024)  Housing: Low Risk  (01/06/2024)  Transportation Needs: No Transportation Needs (01/06/2024)  Utilities: Not At Risk (01/06/2024)  Alcohol Screen: Low Risk  (04/01/2023)  Depression (PHQ2-9): High Risk (01/03/2024)  Financial Resource Strain: High Risk (01/06/2024)  Physical Activity: Inactive (04/01/2023)  Social Connections: Unknown (04/01/2023)  Stress: No Stress Concern Present (04/01/2023)  Tobacco Use: High Risk (01/05/2024)  Health Literacy: Adequate Health Literacy (01/06/2024)    SDOH Interventions: Financial Resources:  Financial Strain Interventions: Artist DSS for financial assistance  Food Insecurity:  Food Insecurity Interventions: Intervention Not Indicated  Housing Insecurity:  Housing Interventions: Community Resources Provided (will mail food resources)  Transportation:   Transportation Interventions: Intervention Not Indicated     Other Care Navigation Interventions:     Provided Pharmacy assistance resources  Pt already enrolled in extra help program   Follow-up plan:   LCSW will mail pt the following information: my card, food resources, utility assistance and LIEAP guidelines for December. Pt encouraged to speak with DSS regarding any other assistance, as well as [facility] Billing regarding payment plan options.

## 2024-01-10 ENCOUNTER — Telehealth: Payer: Self-pay | Admitting: *Deleted

## 2024-01-10 ENCOUNTER — Ambulatory Visit: Payer: Self-pay | Admitting: Family Medicine

## 2024-01-10 ENCOUNTER — Encounter: Payer: Self-pay | Admitting: Family Medicine

## 2024-01-10 ENCOUNTER — Ambulatory Visit (INDEPENDENT_AMBULATORY_CARE_PROVIDER_SITE_OTHER)
Admission: RE | Admit: 2024-01-10 | Discharge: 2024-01-10 | Disposition: A | Source: Ambulatory Visit | Attending: Family Medicine | Admitting: Family Medicine

## 2024-01-10 ENCOUNTER — Ambulatory Visit: Admitting: Family Medicine

## 2024-01-10 VITALS — BP 122/68 | HR 115 | Temp 98.2°F | Ht 64.0 in | Wt 107.1 lb

## 2024-01-10 DIAGNOSIS — R062 Wheezing: Secondary | ICD-10-CM

## 2024-01-10 DIAGNOSIS — C3490 Malignant neoplasm of unspecified part of unspecified bronchus or lung: Secondary | ICD-10-CM | POA: Diagnosis not present

## 2024-01-10 DIAGNOSIS — J432 Centrilobular emphysema: Secondary | ICD-10-CM

## 2024-01-10 DIAGNOSIS — R0789 Other chest pain: Secondary | ICD-10-CM

## 2024-01-10 DIAGNOSIS — J439 Emphysema, unspecified: Secondary | ICD-10-CM

## 2024-01-10 DIAGNOSIS — E042 Nontoxic multinodular goiter: Secondary | ICD-10-CM | POA: Diagnosis not present

## 2024-01-10 DIAGNOSIS — J929 Pleural plaque without asbestos: Secondary | ICD-10-CM | POA: Diagnosis not present

## 2024-01-10 DIAGNOSIS — R079 Chest pain, unspecified: Secondary | ICD-10-CM | POA: Diagnosis not present

## 2024-01-10 DIAGNOSIS — R10A2 Flank pain, left side: Secondary | ICD-10-CM

## 2024-01-10 DIAGNOSIS — R109 Unspecified abdominal pain: Secondary | ICD-10-CM | POA: Diagnosis not present

## 2024-01-10 DIAGNOSIS — F17200 Nicotine dependence, unspecified, uncomplicated: Secondary | ICD-10-CM | POA: Diagnosis not present

## 2024-01-10 DIAGNOSIS — C3431 Malignant neoplasm of lower lobe, right bronchus or lung: Secondary | ICD-10-CM

## 2024-01-10 MED ORDER — GABAPENTIN 100 MG PO CAPS: 100.0000 mg | ORAL_CAPSULE | Freq: Two times a day (BID) | ORAL | 0 refills | Status: DC

## 2024-01-10 NOTE — Telephone Encounter (Signed)
 I do= whenever she feels up to it  As of today she did not feel she could go through it due to her chest wall pain

## 2024-01-10 NOTE — Progress Notes (Addendum)
 Subjective:    Patient ID: Christine Barton, female    DOB: [birth year], 73 y.o.   MRN: [id number]  HPI  Wt Readings from Last 3 Encounters:  01/10/24 107 lb 2 oz (48.6 kg)  01/05/24 109 lb 3.2 oz (49.5 kg)  01/03/24 107 lb 6 oz (48.7 kg)   18.39 kg/m  Vitals:   01/10/24 1223  BP: 122/68  Pulse: (!) 115  Temp: 98.2 F (36.8 C)  SpO2: 95%    Here for chest/ side pain  It was better at last visit    Sunday woke up and pain returned , it became more constant than it was    No trauma  Did not over exert on Saturday  In left anterior chest wall  Tender to the touch   This am got up and bad again   No rash     Took tramadol  when needed  Tylenol  (with caution)  Has used heating pad   No cough  No fever   She is worried about pneumonia   chest X-ray reassuring today Narrative & Impression  CLINICAL DATA:  Left-sided chest pain with wheezing. History of lung cancer.   EXAM: CHEST - 2 VIEW   COMPARISON:  12/07/2023   FINDINGS: Lungs are hyperexpanded with postsurgical changes over the right thorax. Stable changes over the right perihilar region and right upper lobe/apex. Mild stable left apical pleural thickening. No acute airspace process or effusion. Cardiomediastinal silhouette and remainder of the exam is unchanged.   IMPRESSION: 1. No acute cardiopulmonary disease. 2. Stable postsurgical and changes over the right lung.      Patient Active Problem List   Diagnosis Date Noted   Encounter for screening mammogram for breast cancer 01/03/2024   Diabetes mellitus treated with oral medication (HCC) 01/03/2024   Multiple thyroid  nodules 01/01/2024   Left lateral abdominal pain 12/14/2023   Left flank pain 12/14/2023   Depression with anxiety 07/22/2023   Microalbuminuria 07/22/2023   Food insecurity 01/25/2023   Current use of proton pump inhibitor 12/14/2022   Osteopenia 06/17/2022   Pulmonary emphysema (HCC) 09/03/2021   Wheezing 09/03/2021    Agatston CAC score, >400 07/02/2021   Left-sided chest wall pain 06/04/2021   Lung cancer (HCC) 02/25/2021   Iron  deficiency anemia 01/01/2021   Iron  deficiency anemia due to chronic blood loss 11/06/2020   AVM (arteriovenous malformation) of colon    Benign neoplasm of colon    NASH (nonalcoholic steatohepatitis)    Malnutrition 07/25/2019   HSV infection 02/23/2019   Aortic atherosclerosis 01/22/2019   Coronary atherosclerosis 01/22/2019   Encounter for screening for lung cancer 01/04/2019   Venous insufficiency 11/15/2018   Hyponatremia 11/07/2018   Esophageal varices without bleeding (HCC) 10/27/2018   Anemia 10/23/2018   Marasmus 07/26/2018   Poor balance 05/30/2018   Cirrhosis of liver (HCC) 04/24/2018   Gallstones 04/24/2018   Heme positive stool 04/24/2018   Smoker 11/07/2017   Estrogen deficiency 10/26/2016   Colon cancer screening 11/01/2014   Encounter for routine gynecological examination 09/12/2013   Left shoulder pain 08/20/2011   Routine general medical examination at a health care facility 07/21/2011   Vitamin D  deficiency 08/04/2009   POSTMENOPAUSAL STATUS 08/04/2009   Hyperlipidemia associated with type 2 diabetes mellitus (HCC) 06/14/2008   DISC DISEASE, CERVICAL 03/30/2007   History of alcohol abuse 12/06/2006   Neuropathy of both feet 12/06/2006   DIVERTICULOSIS, COLON 12/06/2006   Fatty liver 12/06/2006   BREAST CANCER, HX OF  12/06/2006   Past Medical History:  Diagnosis Date   Alcohol abuse, unspecified    Breast cancer (HCC) 1998   Right   Cataract    left eye   Cervical spondylosis 2006   MRI   Degeneration of cervical intervertebral disc 2006   MRI   Diabetes mellitus without complication (HCC)    Diverticulosis of colon (without mention of hemorrhage)    Hyperpotassemia    Lung cancer (HCC) 03/2021   started radiation in January 2023, right side   Microscopic hematuria    Mononeuritis of unspecified site    Nonspecific abnormal  results of liver function study    Other abnormal glucose    Other and unspecified hyperlipidemia    no per pt   Other chronic nonalcoholic liver disease    Personal history of chemotherapy    Personal history of malignant neoplasm of breast    Personal history of radiation therapy    Pneumothorax, acute    right, spontaneous   Tobacco use disorder    Unspecified vitamin D  deficiency    Past Surgical History:  Procedure Laterality Date   BIOPSY  07/27/2019   Procedure: BIOPSY;  Surgeon: Leigh Elspeth SQUIBB, MD;  Location: [facility];  Service: Gastroenterology;;   BREAST BIOPSY  9/03   Right   BREAST LUMPECTOMY Right 1998   CHEST TUBE INSERTION  11/02/2014   COLONOSCOPY     COLONOSCOPY WITH PROPOFOL  N/A 07/27/2019   Procedure: COLONOSCOPY WITH PROPOFOL ;  Surgeon: Leigh Elspeth SQUIBB, MD;  Location: [facility];  Service: Gastroenterology;  Laterality: N/A;   COLONOSCOPY WITH PROPOFOL  N/A 07/29/2022   Procedure: COLONOSCOPY WITH PROPOFOL ;  Surgeon: Shila Gustav GAILS, MD;  Location: [facility];  Service: Gastroenterology;  Laterality: N/A;   ESOPHAGOGASTRODUODENOSCOPY (EGD) WITH PROPOFOL  N/A 10/30/2018   Procedure: ESOPHAGOGASTRODUODENOSCOPY (EGD) WITH PROPOFOL ;  Surgeon: Shila Gustav GAILS, MD;  Location: [facility];  Service: Endoscopy;  Laterality: N/A;   ESOPHAGOGASTRODUODENOSCOPY (EGD) WITH PROPOFOL  N/A 03/12/2019   Procedure: ESOPHAGOGASTRODUODENOSCOPY (EGD) WITH PROPOFOL ;  Surgeon: Shila Gustav GAILS, MD;  Location: [facility];  Service: Endoscopy;  Laterality: N/A;   ESOPHAGOGASTRODUODENOSCOPY (EGD) WITH PROPOFOL  N/A 07/27/2019   Procedure: ESOPHAGOGASTRODUODENOSCOPY (EGD) WITH PROPOFOL ;  Surgeon: Leigh Elspeth SQUIBB, MD;  Location: [facility];  Service: Gastroenterology;  Laterality: N/A;   EYE SURGERY  02/2017   cataract extraction with lens implant-left   HEMOSTASIS CLIP PLACEMENT  07/29/2022   Procedure: HEMOSTASIS CLIP PLACEMENT;  Surgeon: Shila Gustav GAILS,  MD;  Location: [facility];  Service: Gastroenterology;;   HOT HEMOSTASIS N/A 07/27/2019   Procedure: HOT HEMOSTASIS (ARGON PLASMA COAGULATION/BICAP);  Surgeon: Leigh Elspeth SQUIBB, MD;  Location: [facility];  Service: Gastroenterology;  Laterality: N/A;   MOUTH SURGERY     POLYPECTOMY  07/27/2019   Procedure: POLYPECTOMY;  Surgeon: Leigh Elspeth SQUIBB, MD;  Location: [facility];  Service: Gastroenterology;;   POLYPECTOMY  07/29/2022   Procedure: POLYPECTOMY;  Surgeon: Shila Gustav GAILS, MD;  Location: [facility];  Service: Gastroenterology;;   TUBAL LIGATION     Social History   Tobacco Use   Smoking status: Every Day    Current packs/day: 1.00    Average packs/day: 1 pack/day for 59.2 years (59.2 ttl pk-yrs)    Types: Cigarettes    Start date: 09/25/2021    Passive exposure: Past   Smokeless tobacco: Never   Tobacco comments:    Started smoking at 73 years old.     1PPD 12/20/2022 khj  Vaping Use   Vaping  status: Never Used  Substance Use Topics   Alcohol use: Not Currently   Drug use: No   Family History  Problem Relation Age of Onset   Heart failure Father    Heart attack Father    Colon cancer Maternal Uncle    Stroke Mother    Esophageal cancer Neg Hx    Rectal cancer Neg Hx    Stomach cancer Neg Hx    Pancreatic cancer Neg Hx    Allergies  Allergen Reactions   Anoro Ellipta  [Umeclidinium-Vilanterol] Cough   Bevespi  Aerosphere [Glycopyrrolate-Formoterol ] Cough    Prolonged coughing episode with first dose   Oxycodone  Nausea And Vomiting   Crestor  [Rosuvastatin ]     Abdominal pain     Glipizide  Other (See Comments)    Stomach pain   Losartan      Pt thinks it caused urinary retention     Spironolactone      Urinary retention     Current Outpatient Medications on File Prior to Visit  Medication Sig Dispense Refill   acetaminophen  (TYLENOL ) 500 MG tablet Take 500 mg by mouth every 6 (six) hours as needed for moderate pain.     albuterol  (VENTOLIN   HFA) 108 (90 Base) MCG/ACT inhaler INHALE 2 PUFFS INTO THE LUNGS EVERY 4 HOURS AS NEEDED FOR WHEEZE OR FOR SHORTNESS OF BREATH 8.5 each 2   cholecalciferol (VITAMIN D3) 25 MCG (1000 UNIT) tablet Take 1,000 Units by mouth daily.     clorazepate  (TRANXENE ) 3.75 MG tablet Take 1 tablet (3.75 mg total) by mouth 3 (three) times daily as needed for anxiety. 90 tablet 2   cyclobenzaprine  (FLEXERIL ) 10 MG tablet TAKE 1/2 TABLET BY MOUTH AT BEDTIME AS NEEDED FOR MUSCLE SPASMS 15 tablet 3   desvenlafaxine  (PRISTIQ ) 100 MG 24 hr tablet TAKE 1 TABLET BY MOUTH EVERY DAY 30 tablet 0   lactulose  (CHRONULAC ) 10 GM/15ML solution Take 15 mLs (10 g total) by mouth 2 (two) times daily as needed for severe constipation. 900 mL 3   metFORMIN  (GLUCOPHAGE -XR) 500 MG 24 hr tablet Take 500 mg by mouth daily with breakfast.     Multiple Vitamin (MULTIVITAMIN) tablet Take 1 tablet by mouth daily.     Multiple Vitamins-Minerals (PRESERVISION AREDS 2) CAPS Take 1 capsule by mouth 2 (two) times daily.     mupirocin  ointment (BACTROBAN ) 2 % APPLY 1 APPLICATION TOPICALLY 2 TIMES DAILY AS NEEDED. 15 g 3   nadolol  (CORGARD ) 20 MG tablet Take 1 tablet (20 mg total) by mouth daily. 90 tablet 3   nystatin  (MYCOSTATIN ) 100000 UNIT/ML suspension TAKE 5 MLS (500,000 UNITS TOTAL) BY MOUTH 3 (THREE) TIMES DAILY. SWISH AND SWALLOW 120 mL 0   pantoprazole  (PROTONIX ) 40 MG tablet TAKE 1 TABLET (40 MG TOTAL) BY MOUTH TWICE A DAY BEFORE MEALS 180 tablet 1   rifaximin  (XIFAXAN ) 550 MG TABS tablet Take 1 tablet (550 mg total) by mouth 2 (two) times daily. 180 tablet 3   SYSTANE COMPLETE 0.6 % SOLN Apply 1 drop to eye 2 (two) times daily.     Tiotropium Bromide Monohydrate  (SPIRIVA  RESPIMAT) 2.5 MCG/ACT AERS INHALE 2 PUFFS BY MOUTH INTO THE LUNGS DAILY 4 g 11   ursodiol  (ACTIGALL ) 300 MG capsule TAKE 1 CAPSULE BY MOUTH EVERY DAY 90 capsule 3   No current facility-administered medications on file prior to visit.    Review of Systems   Constitutional:  Positive for fatigue. Negative for activity change, appetite change, fever and unexpected weight change.  HENT:  Negative for congestion, ear pain, rhinorrhea, sinus pressure and sore throat.   Eyes:  Negative for pain, redness and visual disturbance.  Respiratory:  Positive for chest tightness and wheezing. Negative for cough and shortness of breath.        Baseline smoker's cough w/o change   Cardiovascular:  Positive for chest pain. Negative for palpitations.  Gastrointestinal:  Negative for abdominal pain, blood in stool, constipation and diarrhea.  Endocrine: Negative for polydipsia and polyuria.  Genitourinary:  Negative for dysuria, frequency and urgency.  Musculoskeletal:  Negative for arthralgias, back pain and myalgias.  Skin:  Negative for pallor and rash.       Itching is gone   Allergic/Immunologic: Negative for environmental allergies.  Neurological:  Negative for dizziness, syncope and headaches.  Hematological:  Negative for adenopathy. Does not bruise/bleed easily.  Psychiatric/Behavioral:  Positive for dysphoric mood. Negative for decreased concentration. The patient is not nervous/anxious.        Objective:   Physical Exam Constitutional:      General: She is not in acute distress.    Appearance: Normal appearance. She is well-developed. She is not ill-appearing or diaphoretic.     Comments: Frail appearing Underweight   HENT:     Head: Normocephalic and atraumatic.     Mouth/Throat:     Mouth: Mucous membranes are moist.     Pharynx: Oropharynx is clear.  Eyes:     Conjunctiva/sclera: Conjunctivae normal.     Pupils: Pupils are equal, round, and reactive to light.  Neck:     Thyroid : No thyromegaly.     Vascular: No carotid bruit or JVD.  Cardiovascular:     Rate and Rhythm: Normal rate and regular rhythm.     Heart sounds: Normal heart sounds.     No gallop.  Pulmonary:     Effort: Pulmonary effort is normal. No respiratory distress.      Breath sounds: No stridor. Wheezing present. No rales.     Comments: Diffusely distant bs  Diffuse exp wheezes   Baseline smoker's cough  Tender over left anterior/lateral chest wall No crepitus or step off or rash Chest:     Chest wall: Tenderness present.  Abdominal:     General: Bowel sounds are normal. There is no distension or abdominal bruit.     Palpations: Abdomen is soft. There is no mass.     Tenderness: There is no abdominal tenderness. There is no right CVA tenderness, left CVA tenderness, guarding or rebound.  Musculoskeletal:     Cervical back: Normal range of motion and neck supple.     Right lower leg: No edema.     Left lower leg: No edema.  Lymphadenopathy:     Cervical: No cervical adenopathy.  Skin:    General: Skin is warm and dry.     Findings: No rash.     Comments: Rosacea-face and nose     Neurological:     Mental Status: She is alert.     Sensory: No sensory deficit.     Deep Tendon Reflexes: Reflexes are normal and symmetric.  Psychiatric:        Mood and Affect: Mood normal.           Assessment & Plan:   Problem List Items Addressed This Visit       Respiratory   Pulmonary emphysema (HCC)   Worse wheezing today  Baseline smokers cough  Not ready to quit smoking-encouraged strongly to consider it  Due for pulm visit - referral done       Lung cancer [facility])   Has onc follow up in July  Reviewed recent CT  Due for pulm follow up  Referral done  Encouraged strongly to quit smoking       Relevant Orders   Ambulatory referral to Pulmonology     Endocrine   Multiple thyroid  nodules   Pt cancelled her US  since she was in too much pain to do the test Encouraged strongly to re schedule once feeling up to it         Other   Wheezing   Worse in past several days ? Weather related  Cxr ordered  Her left chest wall pain is worse also       Relevant Orders   Ambulatory referral to Pulmonology   DG Chest 2 View  (Completed)   Smoker   Disc in detail risks of smoking and possible outcomes including copd, vascular/ heart disease, cancer , respiratory and sinus infections as well as osteoporosis  Pt voices understanding She is unfortunately not ready to quit despite lung cancer and copd Now with left chest wall pain   Encouraged to keep thinking about it       Left-sided chest wall pain - Primary   Worsened again  More wheezing  Cxr today no new findings/no infiltrate  Trial of low dose gabapentin (100 mg bid) with caution of sedation/falls Will advance if helpful with caution of sedation and falls  If not helpful will refill tramadol   Due for pulmonary follow up   Pain management referral placed in light of limited options for treatment due to cirrhosis       Relevant Orders   Ambulatory referral to Pulmonology   DG Chest 2 View (Completed)   Ambulatory referral to Pain Clinic   Left flank pain   Worsened again  More wheezing  Cxr today  Trial of low dose gabapentin  Due for pulmonary follow up   Consider pain management referral      Relevant Orders   Ambulatory referral to Pain Clinic

## 2024-01-10 NOTE — Assessment & Plan Note (Signed)
 Disc in detail risks of smoking and possible outcomes including copd, vascular/ heart disease, cancer , respiratory and sinus infections as well as osteoporosis  Pt voices understanding She is unfortunately not ready to quit despite lung cancer and copd Now with left chest wall pain   Encouraged to keep thinking about it

## 2024-01-10 NOTE — Assessment & Plan Note (Signed)
 Has onc follow up in July  Reviewed recent CT  Due for pulm follow up  Referral done  Encouraged strongly to quit smoking

## 2024-01-10 NOTE — Assessment & Plan Note (Addendum)
 Worsened again  More wheezing  Cxr today no new findings/no infiltrate  Trial of low dose gabapentin (100 mg bid) with caution of sedation/falls Will advance if helpful with caution of sedation and falls  If not helpful will refill tramadol   Due for pulmonary follow up   Pain management referral placed in light of limited options for treatment due to cirrhosis

## 2024-01-10 NOTE — Telephone Encounter (Signed)
 Pt notified of Dr. Graham comments. Will get US  r/s once she is feeling better

## 2024-01-10 NOTE — Assessment & Plan Note (Signed)
 Pt cancelled her US  since she was in too much pain to do the test Encouraged strongly to re schedule once feeling up to it

## 2024-01-10 NOTE — Assessment & Plan Note (Signed)
 Worse in past several days ? Weather related  Cxr ordered  Her left chest wall pain is worse also

## 2024-01-10 NOTE — Assessment & Plan Note (Signed)
 Worsened again  More wheezing  Cxr today  Trial of low dose gabapentin  Due for pulmonary follow up   Consider pain management referral

## 2024-01-10 NOTE — Assessment & Plan Note (Signed)
 Worse wheezing today  Baseline smokers cough  Not ready to quit smoking-encouraged strongly to consider it    Due for pulm visit - referral done

## 2024-01-10 NOTE — Patient Instructions (Addendum)
 You are due for a visit with Dr Lenda (pulmonary) I put the referral in for that  Please let us  know if you don't hear in 1-2 weeks to set that up (mychart message or call or letter)  You can also call the office   Try the gabapentin for pain  Caution of sedation with it  First dose when you get it , then bedtime After that am and pm   Let us  know if it helps   Chest xray now  We will reach out with result and plan   Then I will work on the pain clinic referral   Think hard about quitting smoking

## 2024-01-10 NOTE — Telephone Encounter (Signed)
 Copied from CRM #[id number]. Topic: General - Other >> Jan 10, 2024  1:46 PM Rosina BIRCH wrote: Reason for CRM: patient called stating she had to cancel her ultrasound appointment for tomorrow and she want to know if the provider want her to reschedule it [phone]

## 2024-01-11 ENCOUNTER — Ambulatory Visit (HOSPITAL_BASED_OUTPATIENT_CLINIC_OR_DEPARTMENT_OTHER): Admitting: Cardiology

## 2024-01-11 ENCOUNTER — Other Ambulatory Visit

## 2024-01-17 ENCOUNTER — Encounter: Payer: Self-pay | Admitting: Pulmonary Disease

## 2024-01-17 ENCOUNTER — Ambulatory Visit: Admitting: Pulmonary Disease

## 2024-01-17 VITALS — BP 130/70 | HR 92 | Ht 64.0 in | Wt 109.0 lb

## 2024-01-17 DIAGNOSIS — G8929 Other chronic pain: Secondary | ICD-10-CM

## 2024-01-17 DIAGNOSIS — J449 Chronic obstructive pulmonary disease, unspecified: Secondary | ICD-10-CM | POA: Diagnosis not present

## 2024-01-17 DIAGNOSIS — F17210 Nicotine dependence, cigarettes, uncomplicated: Secondary | ICD-10-CM | POA: Diagnosis not present

## 2024-01-17 DIAGNOSIS — J432 Centrilobular emphysema: Secondary | ICD-10-CM

## 2024-01-17 DIAGNOSIS — R059 Cough, unspecified: Secondary | ICD-10-CM | POA: Diagnosis not present

## 2024-01-17 DIAGNOSIS — M549 Dorsalgia, unspecified: Secondary | ICD-10-CM

## 2024-01-17 DIAGNOSIS — C3411 Malignant neoplasm of upper lobe, right bronchus or lung: Secondary | ICD-10-CM

## 2024-01-17 MED ORDER — PREDNISONE 20 MG PO TABS: 20.0000 mg | ORAL_TABLET | Freq: Every day | ORAL | 0 refills | Status: AC

## 2024-01-17 MED ORDER — AZITHROMYCIN 250 MG PO TABS: ORAL_TABLET | ORAL | 0 refills | Status: DC

## 2024-01-17 NOTE — Patient Instructions (Signed)
 VISIT SUMMARY:  During today's visit, we discussed your ongoing breathing difficulties and nerve pain. We reviewed your current medications and smoking habits, and we have made some adjustments to your treatment plan to help manage your symptoms more effectively.  YOUR PLAN:  -CHRONIC OBSTRUCTIVE PULMONARY DISEASE (COPD): COPD is a chronic lung condition that makes it hard to breathe. You will continue using Spiriva  once daily, and we are adding a 5-day course of prednisone  to help with your breathing. It is important to stop smoking to improve your lung health.  -CHRONIC TOBACCO USE: Smoking is harmful to your lungs and overall health. Quitting smoking is crucial for improving your COPD symptoms. We discussed various strategies to help you quit smoking.  -SUSPECTED ACUTE BRONCHITIS: Acute bronchitis is an infection of the airways that causes coughing and phlegm production. We are prescribing a 5-day course of azithromycin  (Z-Pak) to treat the infection.  -CHRONIC BACK PAIN WITH SUSPECTED RADICULOPATHY: Radiculopathy is pain caused by nerve compression in the spine. You will continue taking gabapentin for your nerve pain. If your symptoms do not improve, we may need to consider an MRI of your spine.  INSTRUCTIONS:  Please follow the prescribed medication regimen and consider the smoking cessation strategies we discussed. If your back pain does not improve, we may need to schedule an MRI of your spine. Follow up with us  if you have any concerns or if your symptoms worsen.

## 2024-01-17 NOTE — Progress Notes (Unsigned)
 Subjective:    Patient ID: Christine Barton, female    DOB: 19-Jan-1951, 73 y.o.   MRN: [id number]  Patient Care Team: Tower, Laine LABOR, MD as PCP - General (Family Medicine) Kate Lonni CROME, MD as PCP - Cardiology (Cardiology) Fate Morna SAILOR, [facility] (Inactive) as Pharmacist (Pharmacist) Lucio Franky PARAS, OD (Optometry) Sherrod Sherrod, MD as Consulting Physician (Oncology) Nandigam, Kavitha V, MD as Consulting Physician (Gastroenterology)  Chief Complaint  Patient presents with   Medical Management of Chronic Issues    BACKGROUND/INTERVAL:The patient is a 73 year old current smoker (1 PPD) who presents for follow-up of COPD by clinical impression.  Recall that she was initially evaluated for an 8 mm right upper lobe nodule found incidentally on low-dose CT chest during lung cancer screening. The patient had had this abnormality followed previously imaged in October 2021 at that time being only 3.7 mm.  The nodule is on the same area of prior radiation changes breast cancer noted in the right lung. PET/CT performed on 11 February 2021 showed FDG avidity in this nodule.  There was no evidence of other abnormal uptake in the chest with the exception of the aforementioned area of scarring.  The patient opted to forego invasive procedures and go directly to SBRT due to her multiple comorbidities.  She has declined PFTs.  She was last seen here on 12/20/2022.  HPI Discussed the use of AI scribe software for clinical note transcription with the patient, who gave verbal consent to proceed.  History of Present Illness   Christine Barton is a 73 year old female with COPD who presents with breathing difficulties and nerve pain.  She experiences persistent 'ragged' breathing despite using Spiriva  twice daily, once in the morning and once at night. Her symptoms continue despite medication, and she attributes some of her breathing difficulties to her current smoking habit of one pack per day. She has  tried various inhalers and prefers Spiriva , although she still experiences breathing difficulties. A chest CT was performed on 17 September, and she recalls being told there was a small nodule. Coughing is painful and produces light-colored phlegm. No wheezing is reported, and she uses an albuterol  inhaler infrequently. She has previously used prednisone , which has been helpful for wheezing.  She describes a persistent pain that has been ongoing for six weeks, located in her back and possibly related to nerve issues. She is currently taking gabapentin, which she believes has helped reduce the severity of the pain. She recalls having similar nerve pain in the past, which was managed with exercise and medication.  Her social history includes smoking one pack of cigarettes per day, which she acknowledges may contribute to her respiratory symptoms.                           Review of Systems A 10 point review of systems was performed and it is as noted above otherwise negative.   Patient Active Problem List   Diagnosis Date Noted   Encounter for screening mammogram for breast cancer 01/03/2024   Diabetes mellitus treated with oral medication (HCC) 01/03/2024   Multiple thyroid  nodules 01/01/2024   Left lateral abdominal pain 12/14/2023   Left flank pain 12/14/2023   Depression with anxiety 07/22/2023   Microalbuminuria 07/22/2023   Food insecurity 01/25/2023   Current use of proton pump inhibitor 12/14/2022   Osteopenia 06/17/2022   Pulmonary emphysema (HCC) 09/03/2021   Wheezing 09/03/2021   Agatston CAC  score, >400 07/02/2021   Left-sided chest wall pain 06/04/2021   Lung cancer (HCC) 02/25/2021   Iron  deficiency anemia 01/01/2021   Iron  deficiency anemia due to chronic blood loss 11/06/2020   AVM (arteriovenous malformation) of colon    Benign neoplasm of colon    NASH (nonalcoholic steatohepatitis)    Malnutrition 07/25/2019   HSV infection 02/23/2019   Aortic atherosclerosis  01/22/2019   Coronary atherosclerosis 01/22/2019   Encounter for screening for lung cancer 01/04/2019   Venous insufficiency 11/15/2018   Hyponatremia 11/07/2018   Esophageal varices without bleeding (HCC) 10/27/2018   Anemia 10/23/2018   Marasmus 07/26/2018   Poor balance 05/30/2018   Cirrhosis of liver (HCC) 04/24/2018   Gallstones 04/24/2018   Heme positive stool 04/24/2018   Smoker 11/07/2017   Estrogen deficiency 10/26/2016   Colon cancer screening 11/01/2014   Encounter for routine gynecological examination 09/12/2013   Left shoulder pain 08/20/2011   Routine general medical examination at a health care facility 07/21/2011   Vitamin D  deficiency 08/04/2009   POSTMENOPAUSAL STATUS 08/04/2009   Hyperlipidemia associated with type 2 diabetes mellitus (HCC) 06/14/2008   DISC DISEASE, CERVICAL 03/30/2007   History of alcohol abuse 12/06/2006   Neuropathy of both feet 12/06/2006   DIVERTICULOSIS, COLON 12/06/2006   Fatty liver 12/06/2006   BREAST CANCER, HX OF 12/06/2006    Social History   Tobacco Use   Smoking status: Every Day    Current packs/day: 1.00    Average packs/day: 1 pack/day for 59.2 years (59.2 ttl pk-yrs)    Types: Cigarettes    Start date: 09/25/2021    Passive exposure: Past   Smokeless tobacco: Never   Tobacco comments:    Started smoking at 73 years old.     1PPD 12/20/2022 khj  Substance Use Topics   Alcohol use: Not Currently    Allergies  Allergen Reactions   Anoro Ellipta  [Umeclidinium-Vilanterol] Cough   Bevespi  Aerosphere [Glycopyrrolate-Formoterol ] Cough    Prolonged coughing episode with first dose   Oxycodone  Nausea And Vomiting   Crestor  [Rosuvastatin ]     Abdominal pain     Glipizide  Other (See Comments)    Stomach pain   Losartan      Pt thinks it caused urinary retention     Spironolactone      Urinary retention      Current Meds  Medication Sig   acetaminophen  (TYLENOL ) 500 MG tablet Take 500 mg by mouth every 6 (six)  hours as needed for moderate pain.   albuterol  (VENTOLIN  HFA) 108 (90 Base) MCG/ACT inhaler INHALE 2 PUFFS INTO THE LUNGS EVERY 4 HOURS AS NEEDED FOR WHEEZE OR FOR SHORTNESS OF BREATH   azithromycin  (ZITHROMAX ) 250 MG tablet Take 2 tablets (500 mg) on  Day 1,  followed by 1 tablet (250 mg) once daily on Days 2 through 5.   cholecalciferol (VITAMIN D3) 25 MCG (1000 UNIT) tablet Take 1,000 Units by mouth daily.   clorazepate  (TRANXENE ) 3.75 MG tablet Take 1 tablet (3.75 mg total) by mouth 3 (three) times daily as needed for anxiety.   cyclobenzaprine  (FLEXERIL ) 10 MG tablet TAKE 1/2 TABLET BY MOUTH AT BEDTIME AS NEEDED FOR MUSCLE SPASMS   desvenlafaxine  (PRISTIQ ) 100 MG 24 hr tablet TAKE 1 TABLET BY MOUTH EVERY DAY   gabapentin (NEURONTIN) 100 MG capsule Take 1 capsule (100 mg total) by mouth 2 (two) times daily. Caution of sedation   lactulose  (CHRONULAC ) 10 GM/15ML solution Take 15 mLs (10 g total) by mouth 2 (two)  times daily as needed for severe constipation.   metFORMIN  (GLUCOPHAGE -XR) 500 MG 24 hr tablet Take 500 mg by mouth daily with breakfast.   Multiple Vitamin (MULTIVITAMIN) tablet Take 1 tablet by mouth daily.   Multiple Vitamins-Minerals (PRESERVISION AREDS 2) CAPS Take 1 capsule by mouth 2 (two) times daily.   mupirocin  ointment (BACTROBAN ) 2 % APPLY 1 APPLICATION TOPICALLY 2 TIMES DAILY AS NEEDED.   nadolol  (CORGARD ) 20 MG tablet Take 1 tablet (20 mg total) by mouth daily.   pantoprazole  (PROTONIX ) 40 MG tablet TAKE 1 TABLET (40 MG TOTAL) BY MOUTH TWICE A DAY BEFORE MEALS   predniSONE  (DELTASONE ) 20 MG tablet Take 1 tablet (20 mg total) by mouth daily with breakfast for 5 days.   rifaximin  (XIFAXAN ) 550 MG TABS tablet Take 1 tablet (550 mg total) by mouth 2 (two) times daily.   SYSTANE COMPLETE 0.6 % SOLN Apply 1 drop to eye 2 (two) times daily.   Tiotropium Bromide Monohydrate  (SPIRIVA  RESPIMAT) 2.5 MCG/ACT AERS INHALE 2 PUFFS BY MOUTH INTO THE LUNGS DAILY   ursodiol  (ACTIGALL ) 300  MG capsule TAKE 1 CAPSULE BY MOUTH EVERY DAY   [DISCONTINUED] nystatin  (MYCOSTATIN ) 100000 UNIT/ML suspension TAKE 5 MLS (500,000 UNITS TOTAL) BY MOUTH 3 (THREE) TIMES DAILY. SWISH AND SWALLOW    Immunization History  Administered Date(s) Administered   Fluad Quad(high Dose 65+) 12/04/2018, 01/19/2020, 12/23/2021   Fluad Trivalent(High Dose 65+) 12/06/2022   Hep A / Hep B 09/05/2018, 10/10/2018, 03/29/2019   INFLUENZA, HIGH DOSE SEASONAL PF 02/03/2021   Influenza Split 03/10/2011   Influenza Whole 02/11/2000, 02/06/2016   Influenza,inj,Quad PF,6+ Mos 02/16/2017, 04/18/2018   Influenza-Unspecified 02/11/2016   PFIZER(Purple Top)SARS-COV-2 Vaccination 05/21/2019, 06/16/2019, 02/13/2020   Pfizer Covid-19 Vaccine Bivalent Booster 88yrs & up 02/10/2021   Pneumococcal Conjugate-13 10/26/2016   Pneumococcal Polysaccharide-23 06/28/2008, 11/07/2017   Respiratory Syncytial Virus Vaccine,Recomb Aduvanted(Arexvy) 05/18/2022   Td 05/21/2002, 01/31/2023   Tdap 09/12/2013   Zoster Recombinant(Shingrix) 06/18/2022, 02/03/2023   Zoster, Live 08/24/2011        Objective:     BP 130/70   Pulse 92   Ht 5' 4 (1.626 m)   Wt 109 lb (49.4 kg)   LMP  (LMP Unknown)   SpO2 96%   BMI 18.71 kg/m   SpO2: 96 %  GENERAL: Thin, frail woman, chronically ill-appearing, no acute distress, fully ambulatory, no conversational dyspnea. HEAD: Normocephalic, atraumatic.  EYES: Pupils equal, round, reactive to light.  No scleral icterus.  MOUTH: Dentures uppers and lowers, no evidence of thrush. NECK: Supple. No thyromegaly. Trachea midline. No JVD.  No adenopathy. PULMONARY: Good air entry bilaterally.  Coarse breath sounds, scattered rhonchi, no wheezes. CARDIOVASCULAR: S1 and S2. Regular rate and rhythm.  ABDOMEN: Scaphoid otherwise benign. MUSCULOSKELETAL: No joint deformity, no clubbing, no edema.  NEUROLOGIC: Grossly nonfocal.  Speech fluent. SKIN: Intact,warm,dry.  Sallow countenance. PSYCH: Mood  and behavior normal.        Assessment & Plan:     ICD-10-CM   1. Centrilobular emphysema (HCC)  J43.2     2. Malignant neoplasm of upper lobe of right lung (HCC)  C34.11     3. Tobacco dependence due to cigarettes  F17.210      Meds ordered this encounter  Medications   azithromycin  (ZITHROMAX ) 250 MG tablet    Sig: Take 2 tablets (500 mg) on  Day 1,  followed by 1 tablet (250 mg) once daily on Days 2 through 5.    Dispense:  6 each  Refill:  0   predniSONE  (DELTASONE ) 20 MG tablet    Sig: Take 1 tablet (20 mg total) by mouth daily with breakfast for 5 days.    Dispense:  5 tablet    Refill:  0   Assessment and Plan    Chronic obstructive pulmonary disease (COPD) COPD with persistent symptoms of dyspnea despite Spiriva  use. No significant changes on recent chest CT. Continues to smoke a pack a day, contributing to symptoms. No wheezing on examination. - Continue Spiriva  once daily - Prescribe a 5-day course of prednisone  - Encourage smoking cessation  Chronic tobacco use Chronic tobacco use, currently smoking one pack per day. Smoking cessation is crucial for improving lung health and reducing COPD symptoms. - Advise smoking cessation - Discuss smoking cessation strategies  Suspected acute bronchitis Suspected acute bronchitis due to cough with phlegm production. Phlegm is light in color. No wheezing on examination. - Prescribe a 5-day course of azithromycin  (Z-Pak)  Chronic back pain with suspected radiculopathy Chronic back pain with suspected radiculopathy, likely due to nerve compression. Pain improved with gabapentin, indicating a nerve-related etiology. Previous spine x-ray did not reveal significant findings. MRI of the spine may be needed if symptoms do not improve. - Continue gabapentin for nerve pain - Consider MRI of the spine if symptoms do not improve-per primary     Advised if symptoms do not improve or worsen, to please contact office for sooner  follow up or seek emergency care.    I spent 32 minutes of dedicated to the care of this patient on the date of this encounter to include pre-visit review of records, face-to-face time with the patient discussing conditions above, post visit ordering of testing, clinical documentation with the electronic health record, making appropriate referrals as documented, and communicating necessary findings to members of the patients care team.     C. Leita Sanders, MD Advanced Bronchoscopy PCCM [facility]    *This note was generated using voice recognition software/Dragon and/or AI transcription program.  Despite best efforts to proofread, errors can occur which can change the meaning. Any transcriptional errors that result from this process are unintentional and may not be fully corrected at the time of dictation.

## 2024-01-18 ENCOUNTER — Telehealth: Payer: Self-pay | Admitting: Licensed Clinical Social Worker

## 2024-01-18 ENCOUNTER — Other Ambulatory Visit: Payer: Self-pay | Admitting: Family Medicine

## 2024-01-18 DIAGNOSIS — M549 Dorsalgia, unspecified: Secondary | ICD-10-CM

## 2024-01-18 DIAGNOSIS — R10A2 Flank pain, left side: Secondary | ICD-10-CM

## 2024-01-18 DIAGNOSIS — R0789 Other chest pain: Secondary | ICD-10-CM

## 2024-01-18 NOTE — Telephone Encounter (Signed)
 H&V Care Navigation CSW Progress Note  Clinical Social Worker contacted patient by phone to f/u on community resources sent to home address. Reached her at [phone]. She shares that she hasn't kept up with mail, isnt sure if she received resources but will take time to check again. She is okay with me calling her next week to check further/resend if needed.  Patient is participating in a Managed Medicaid Plan:  No, [employer] Medicare  SDOH Screenings   Food Insecurity: Food Insecurity Present (01/06/2024)  Housing: Low Risk  (01/06/2024)  Transportation Needs: No Transportation Needs (01/06/2024)  Utilities: Not At Risk (01/06/2024)  Alcohol Screen: Low Risk  (04/01/2023)  Depression (PHQ2-9): High Risk (01/03/2024)  Financial Resource Strain: High Risk (01/06/2024)  Physical Activity: Inactive (04/01/2023)  Social Connections: Unknown (04/01/2023)  Stress: No Stress Concern Present (04/01/2023)  Tobacco Use: High Risk (01/17/2024)  Health Literacy: Adequate Health Literacy (01/06/2024)     Marit Lark, MSW, LCSW Clinical Social Worker II [facility] Heart/Vascular Care Navigation  [phone]- work cell phone (preferred)

## 2024-01-19 ENCOUNTER — Telehealth: Payer: Self-pay | Admitting: Gastroenterology

## 2024-01-19 NOTE — Telephone Encounter (Signed)
 Last filled on 12/07/22 #120 mL/ 0 refill   Last OV was on 04/10/24

## 2024-01-19 NOTE — Telephone Encounter (Signed)
 Patient requesting to speak specifically to you in regards to complications she is having.

## 2024-01-19 NOTE — Telephone Encounter (Signed)
 Spoke with the patient. PCP has been treating her for pain in her right side. It is under the left breast and into her back. She is now on gabapentin BID. She says it may help a little. She wants to know could this be pain due to her cirrhosis. Her PCP has checked several things and has not found a cause.  She has an appointment to come here 02/14/24. She asks could it be time to go see hepatology?

## 2024-01-20 ENCOUNTER — Encounter: Payer: Self-pay | Admitting: Pulmonary Disease

## 2024-01-20 ENCOUNTER — Other Ambulatory Visit (HOSPITAL_COMMUNITY): Payer: Self-pay

## 2024-01-20 ENCOUNTER — Telehealth: Payer: Self-pay

## 2024-01-20 NOTE — Telephone Encounter (Signed)
 Left-sided chest pain unlikely to be related to cirrhosis.  She has right lung lesions, possible etiology for right-sided chest pain.  Given her liver function has been stable with normal MELD, plan for follow-up every 6 months.  If she has any new concerns, please schedule appointment with APP.  Thank you

## 2024-01-20 NOTE — Telephone Encounter (Signed)
 Patient advised. Due appointment in January. Schedule is unavailable .

## 2024-01-20 NOTE — Telephone Encounter (Signed)
 I refilled this   If thrush symptoms don't improve next week please follow up

## 2024-01-20 NOTE — Telephone Encounter (Signed)
 Inbound call from patient states she would like to keep the apt with provider. Patient states she does not needing any additional advising.

## 2024-01-20 NOTE — Telephone Encounter (Signed)
 Pharmacy Patient Advocate Encounter   Received notification from CoverMyMeds that prior authorization for Xifaxan  550MG  tablets is required/requested.   Insurance verification completed.   The patient is insured through [employer] [facility] Medicare.   Per test claim:

## 2024-01-24 ENCOUNTER — Telehealth: Payer: Self-pay | Admitting: Licensed Clinical Social Worker

## 2024-01-24 ENCOUNTER — Other Ambulatory Visit: Payer: Self-pay | Admitting: Family Medicine

## 2024-01-24 DIAGNOSIS — M549 Dorsalgia, unspecified: Secondary | ICD-10-CM | POA: Insufficient documentation

## 2024-01-24 MED ORDER — GABAPENTIN 100 MG PO CAPS: ORAL_CAPSULE | ORAL | 2 refills | Status: DC

## 2024-01-24 NOTE — Telephone Encounter (Signed)
 Pt sent a mychart regarding her gabapentin, she tried to pick up her Rx from 01/10/24 but since she is taking more then BID it says that it's to soon to refill. Pt is requesting PCP to send in new Rx with new dose as soon as she can. Pt is out of meds and can't see very good at night so is requesting PCP send in med asap.  Please see pt's mychart message from last night

## 2024-01-24 NOTE — Telephone Encounter (Signed)
 Copied from CRM #[id number]. Topic: Clinical - Medication Question >> Jan 24, 2024  1:32 PM Franky GRADE wrote: Reason for CRM: Patient is calling to follow up on a refill request for gabapentin (NEURONTIN) 100 MG capsule [[id number]] that was requested by the patient's pharmacy. She took her last pill today and is completely out.

## 2024-01-24 NOTE — Telephone Encounter (Signed)
 H&V Care Navigation CSW Progress Note  Clinical Social Worker contacted patient by phone to f/u on community resources for assistance. No answer today at [phone], unable to leave vm as it is currently full. Will re-attempt again as able.  Patient is participating in a Managed Medicaid Plan:  No, [employer]na Medicare  SDOH Screenings   Food Insecurity: Food Insecurity Present (01/06/2024)  Housing: Low Risk  (01/06/2024)  Transportation Needs: No Transportation Needs (01/06/2024)  Utilities: Not At Risk (01/06/2024)  Alcohol Screen: Low Risk  (04/01/2023)  Depression (PHQ2-9): High Risk (01/03/2024)  Financial Resource Strain: High Risk (01/06/2024)  Physical Activity: Inactive (04/01/2023)  Social Connections: Unknown (04/01/2023)  Stress: No Stress Concern Present (04/01/2023)  Tobacco Use: High Risk (01/20/2024)  Health Literacy: Adequate Health Literacy (01/06/2024)    Marit Lark, MSW, LCSW Clinical Social Worker II [facility] Heart/Vascular Care Navigation  [phone]- work cell phone (preferred)

## 2024-01-25 ENCOUNTER — Encounter: Payer: Self-pay | Admitting: Physician Assistant

## 2024-01-25 NOTE — Progress Notes (Shared)
 [facility] - Clinic Note  02/01/2024   CHIEF COMPLAINT Patient presents for No chief complaint on file.  HISTORY OF PRESENT ILLNESS: Christine Barton is a 73 y.o. female who presents to the clinic today for:   Referring physician: Tower, Laine LABOR, MD [address] [place],  [place] [address]  HISTORICAL INFORMATION:  Selected notes from the MEDICAL RECORD NUMBER Referred by Dr. JAMA:  Ocular Hx- PMH-   CURRENT MEDICATIONS: Current Outpatient Medications (Ophthalmic Drugs)  Medication Sig   SYSTANE COMPLETE 0.6 % SOLN Apply 1 drop to eye 2 (two) times daily.   No current facility-administered medications for this visit. (Ophthalmic Drugs)   Current Outpatient Medications (Other)  Medication Sig   acetaminophen  (TYLENOL ) 500 MG tablet Take 500 mg by mouth every 6 (six) hours as needed for moderate pain.   albuterol  (VENTOLIN  HFA) 108 (90 Base) MCG/ACT inhaler INHALE 2 PUFFS INTO THE LUNGS EVERY 4 HOURS AS NEEDED FOR WHEEZE OR FOR SHORTNESS OF BREATH   azithromycin  (ZITHROMAX ) 250 MG tablet Take 2 tablets (500 mg) on  Day 1,  followed by 1 tablet (250 mg) once daily on Days 2 through 5.   cholecalciferol (VITAMIN D3) 25 MCG (1000 UNIT) tablet Take 1,000 Units by mouth daily.   clorazepate  (TRANXENE ) 3.75 MG tablet Take 1 tablet (3.75 mg total) by mouth 3 (three) times daily as needed for anxiety.   cyclobenzaprine  (FLEXERIL ) 10 MG tablet TAKE 1/2 TABLET BY MOUTH AT BEDTIME AS NEEDED FOR MUSCLE SPASMS   desvenlafaxine  (PRISTIQ ) 100 MG 24 hr tablet TAKE 1 TABLET BY MOUTH EVERY DAY   gabapentin (NEURONTIN) 100 MG capsule Take 3 pills by mouth in the am and 2 pills in the pm with caution of sedation   lactulose  (CHRONULAC ) 10 GM/15ML solution Take 15 mLs (10 g total) by mouth 2 (two) times daily as needed for severe constipation.   metFORMIN  (GLUCOPHAGE -XR) 500 MG 24 hr tablet Take 500 mg by mouth daily with breakfast.   Multiple Vitamin (MULTIVITAMIN) tablet  Take 1 tablet by mouth daily.   Multiple Vitamins-Minerals (PRESERVISION AREDS 2) CAPS Take 1 capsule by mouth 2 (two) times daily.   mupirocin  ointment (BACTROBAN ) 2 % APPLY 1 APPLICATION TOPICALLY 2 TIMES DAILY AS NEEDED.   nadolol  (CORGARD ) 20 MG tablet Take 1 tablet (20 mg total) by mouth daily.   nystatin  (MYCOSTATIN ) 100000 UNIT/ML suspension TAKE 5 MLS (500,000 UNITS TOTAL) BY MOUTH 3 (THREE) TIMES DAILY. SWISH AND SWALLOW   pantoprazole  (PROTONIX ) 40 MG tablet TAKE 1 TABLET (40 MG TOTAL) BY MOUTH TWICE A DAY BEFORE MEALS   rifaximin  (XIFAXAN ) 550 MG TABS tablet Take 1 tablet (550 mg total) by mouth 2 (two) times daily.   Tiotropium Bromide Monohydrate  (SPIRIVA  RESPIMAT) 2.5 MCG/ACT AERS INHALE 2 PUFFS BY MOUTH INTO THE LUNGS DAILY   ursodiol  (ACTIGALL ) 300 MG capsule TAKE 1 CAPSULE BY MOUTH EVERY DAY   No current facility-administered medications for this visit. (Other)   REVIEW OF SYSTEMS:  ALLERGIES Allergies  Allergen Reactions   Anoro Ellipta  [Umeclidinium-Vilanterol] Cough   Bevespi  Aerosphere [Glycopyrrolate-Formoterol ] Cough    Prolonged coughing episode with first dose   Oxycodone  Nausea And Vomiting   Crestor  [Rosuvastatin ]     Abdominal pain     Glipizide  Other (See Comments)    Stomach pain   Losartan      Pt thinks it caused urinary retention     Spironolactone      Urinary retention  PAST MEDICAL HISTORY Past Medical History:  Diagnosis Date   Alcohol abuse, unspecified    Breast cancer (HCC) 1998   Right   Cataract    left eye   Cervical spondylosis 2006   MRI   Degeneration of cervical intervertebral disc 2006   MRI   Diabetes mellitus without complication (HCC)    Diverticulosis of colon (without mention of hemorrhage)    Hyperpotassemia    Lung cancer (HCC) 03/2021   started radiation in January 2023, right side   Microscopic hematuria    Mononeuritis of unspecified site    Nonspecific abnormal results of liver function study    Other  abnormal glucose    Other and unspecified hyperlipidemia    no per pt   Other chronic nonalcoholic liver disease    Personal history of chemotherapy    Personal history of malignant neoplasm of breast    Personal history of radiation therapy    Pneumothorax, acute    right, spontaneous   Tobacco use disorder    Unspecified vitamin D  deficiency    Past Surgical History:  Procedure Laterality Date   BIOPSY  07/27/2019   Procedure: BIOPSY;  Surgeon: Leigh Elspeth SQUIBB, MD;  Location: [facility];  Service: Gastroenterology;;   BREAST BIOPSY  9/03   Right   BREAST LUMPECTOMY Right 1998   CHEST TUBE INSERTION  11/02/2014   COLONOSCOPY     COLONOSCOPY WITH PROPOFOL  N/A 07/27/2019   Procedure: COLONOSCOPY WITH PROPOFOL ;  Surgeon: Leigh Elspeth SQUIBB, MD;  Location: [facility];  Service: Gastroenterology;  Laterality: N/A;   COLONOSCOPY WITH PROPOFOL  N/A 07/29/2022   Procedure: COLONOSCOPY WITH PROPOFOL ;  Surgeon: Shila Gustav GAILS, MD;  Location: [facility];  Service: Gastroenterology;  Laterality: N/A;   ESOPHAGOGASTRODUODENOSCOPY (EGD) WITH PROPOFOL  N/A 10/30/2018   Procedure: ESOPHAGOGASTRODUODENOSCOPY (EGD) WITH PROPOFOL ;  Surgeon: Shila Gustav GAILS, MD;  Location: [facility];  Service: Endoscopy;  Laterality: N/A;   ESOPHAGOGASTRODUODENOSCOPY (EGD) WITH PROPOFOL  N/A 03/12/2019   Procedure: ESOPHAGOGASTRODUODENOSCOPY (EGD) WITH PROPOFOL ;  Surgeon: Shila Gustav GAILS, MD;  Location: [facility];  Service: Endoscopy;  Laterality: N/A;   ESOPHAGOGASTRODUODENOSCOPY (EGD) WITH PROPOFOL  N/A 07/27/2019   Procedure: ESOPHAGOGASTRODUODENOSCOPY (EGD) WITH PROPOFOL ;  Surgeon: Leigh Elspeth SQUIBB, MD;  Location: [facility];  Service: Gastroenterology;  Laterality: N/A;   EYE SURGERY  02/2017   cataract extraction with lens implant-left   HEMOSTASIS CLIP PLACEMENT  07/29/2022   Procedure: HEMOSTASIS CLIP PLACEMENT;  Surgeon: Shila Gustav GAILS, MD;  Location: [facility];  Service:  Gastroenterology;;   HOT HEMOSTASIS N/A 07/27/2019   Procedure: HOT HEMOSTASIS (ARGON PLASMA COAGULATION/BICAP);  Surgeon: Leigh Elspeth SQUIBB, MD;  Location: [facility];  Service: Gastroenterology;  Laterality: N/A;   MOUTH SURGERY     POLYPECTOMY  07/27/2019   Procedure: POLYPECTOMY;  Surgeon: Leigh Elspeth SQUIBB, MD;  Location: [facility];  Service: Gastroenterology;;   POLYPECTOMY  07/29/2022   Procedure: POLYPECTOMY;  Surgeon: Shila Gustav GAILS, MD;  Location: [facility];  Service: Gastroenterology;;   TUBAL LIGATION     FAMILY HISTORY Family History  Problem Relation Age of Onset   Heart failure Father    Heart attack Father    Colon cancer Maternal Uncle    Stroke Mother    Esophageal cancer Neg Hx    Rectal cancer Neg Hx    Stomach cancer Neg Hx    Pancreatic cancer Neg Hx    SOCIAL HISTORY Social History   Tobacco Use   Smoking status: Every Day  Current packs/day: 1.00    Average packs/day: 1 pack/day for 59.2 years (59.2 ttl pk-yrs)    Types: Cigarettes    Start date: 09/25/2021    Passive exposure: Past   Smokeless tobacco: Never   Tobacco comments:    Started smoking at 73 years old.     1PPD 12/20/2022 khj  Vaping Use   Vaping status: Never Used  Substance Use Topics   Alcohol use: Not Currently   Drug use: No       OPHTHALMIC EXAM:  Not recorded    IMAGING AND PROCEDURES  Imaging and Procedures for 02/01/2024        ASSESSMENT/PLAN: No diagnosis found. 1.  2.  3.  Ophthalmic Meds Ordered this visit:  No orders of the defined types were placed in this encounter.    No follow-ups on file.  There are no Patient Instructions on file for this visit.  Explained the diagnoses, plan, and follow up with the patient and they expressed understanding.  Patient expressed understanding of the importance of proper follow up care.   [place] JUDITHANN Hans, M.D., Ph.D. Diseases & Surgery of the Retina and Vitreous [facility] 02/01/2024  Abbreviations: M myopia (nearsighted); A astigmatism; H hyperopia (farsighted); P presbyopia; Mrx spectacle prescription;  CTL contact lenses; OD right eye; OS left eye; OU both eyes  XT exotropia; ET esotropia; PEK punctate epithelial keratitis; PEE punctate epithelial erosions; DES dry eye syndrome; MGD meibomian gland dysfunction; ATs artificial tears; PFAT's preservative free artificial tears; NSC nuclear sclerotic cataract; PSC posterior subcapsular cataract; ERM epi-retinal membrane; PVD posterior vitreous detachment; RD retinal detachment; DM diabetes mellitus; DR diabetic retinopathy; NPDR non-proliferative diabetic retinopathy; PDR proliferative diabetic retinopathy; CSME clinically significant macular edema; DME diabetic macular edema; dbh dot blot hemorrhages; CWS cotton wool spot; POAG primary open angle glaucoma; C/D cup-to-disc ratio; HVF humphrey visual field; GVF goldmann visual field; OCT optical coherence tomography; IOP intraocular pressure; BRVO Branch retinal vein occlusion; CRVO central retinal vein occlusion; CRAO central retinal artery occlusion; BRAO branch retinal artery occlusion; RT retinal tear; SB scleral buckle; PPV pars plana vitrectomy; VH Vitreous hemorrhage; PRP panretinal laser photocoagulation; IVK intravitreal kenalog ; VMT vitreomacular traction; MH Macular hole;  NVD neovascularization of the disc; NVE neovascularization elsewhere; AREDS age related eye disease study; ARMD age related macular degeneration; POAG primary open angle glaucoma; EBMD epithelial/anterior basement membrane dystrophy; ACIOL anterior chamber intraocular lens; IOL intraocular lens; PCIOL posterior chamber intraocular lens; Phaco/IOL phacoemulsification with intraocular lens placement; PRK photorefractive keratectomy; LASIK laser assisted in situ keratomileusis; HTN hypertension; DM diabetes mellitus; COPD chronic obstructive pulmonary disease

## 2024-01-26 ENCOUNTER — Telehealth (HOSPITAL_BASED_OUTPATIENT_CLINIC_OR_DEPARTMENT_OTHER): Payer: Self-pay | Admitting: Licensed Clinical Social Worker

## 2024-01-26 NOTE — Telephone Encounter (Signed)
 H&V Care Navigation CSW Progress Note  Clinical Social Worker contacted patient by phone to f/u on community resources for assistance. Was able to reach her today at [phone]. She shares that she should have the resources but has been managing some chronic pain challenges and what sounds like an upcoming eye procedure/exam. Encouraged her to call me as needed should our team be able to be of assistance. No additional questions at this time.    Patient is participating in a Managed Medicaid Plan:  No, [employer] Medicare  SDOH Screenings   Food Insecurity: Food Insecurity Present (01/06/2024)  Housing: Low Risk  (01/06/2024)  Transportation Needs: No Transportation Needs (01/06/2024)  Utilities: Not At Risk (01/06/2024)  Alcohol Screen: Low Risk  (04/01/2023)  Depression (PHQ2-9): High Risk (01/03/2024)  Financial Resource Strain: High Risk (01/06/2024)  Physical Activity: Inactive (04/01/2023)  Social Connections: Unknown (04/01/2023)  Stress: No Stress Concern Present (04/01/2023)  Tobacco Use: High Risk (01/20/2024)  Health Literacy: Adequate Health Literacy (01/06/2024)    Marit Lark, MSW, LCSW Clinical Social Worker [facility] Heart/Vascular Care Navigation  [phone]- work cell phone (preferred)

## 2024-01-27 NOTE — Progress Notes (Signed)
 [facility] - Clinic Note  02/06/2024   CHIEF COMPLAINT Patient presents for Retina Evaluation  HISTORY OF PRESENT ILLNESS: Christine Barton is a 73 y.o. female who presents to the clinic today for:  HPI     Retina Evaluation   In both eyes.  This started 1 year ago.  Duration of 1 year.  Associated Symptoms Floaters.  Context:  distance vision and mid-range vision.  Treatments tried include artificial tears.        Comments   Retina eval per Dr Fleeta  GA in both eye significant vision change in the last year pt is reporting floaters denies any flashes she has a cataract in her right eye OS was done  2023 Dr Raj pt is diabetic she does not recall her last reading last A1C 6.7 pt is having cataract surgery OD in December 12th with Dr Fleeta  pt states they think chemo is the cause her last treatment was 1998      Last edited by Resa Delon ORN, COT on 02/06/2024  1:26 PM.     Referring physician: Tower, Laine LABOR, MD [address] [place],  [place] [address]  HISTORICAL INFORMATION:  Selected notes from the MEDICAL RECORD NUMBER Referred by Dr. JAMA:  Ocular Hx- PMH-   CURRENT MEDICATIONS: Current Outpatient Medications (Ophthalmic Drugs)  Medication Sig   SYSTANE COMPLETE 0.6 % SOLN Apply 1 drop to eye 2 (two) times daily.   No current facility-administered medications for this visit. (Ophthalmic Drugs)   Current Outpatient Medications (Other)  Medication Sig   acetaminophen  (TYLENOL ) 500 MG tablet Take 500 mg by mouth every 6 (six) hours as needed for moderate pain.   albuterol  (VENTOLIN  HFA) 108 (90 Base) MCG/ACT inhaler INHALE 2 PUFFS INTO THE LUNGS EVERY 4 HOURS AS NEEDED FOR WHEEZE OR FOR SHORTNESS OF BREATH   azithromycin  (ZITHROMAX ) 250 MG tablet Take 2 tablets (500 mg) on  Day 1,  followed by 1 tablet (250 mg) once daily on Days 2 through 5.   cholecalciferol (VITAMIN D3) 25 MCG (1000 UNIT) tablet Take 1,000 Units by mouth daily.    clorazepate  (TRANXENE ) 3.75 MG tablet Take 1 tablet (3.75 mg total) by mouth 3 (three) times daily as needed for anxiety.   cyclobenzaprine  (FLEXERIL ) 10 MG tablet TAKE 1/2 TABLET BY MOUTH AT BEDTIME AS NEEDED FOR MUSCLE SPASMS   desvenlafaxine  (PRISTIQ ) 100 MG 24 hr tablet TAKE 1 TABLET BY MOUTH EVERY DAY   gabapentin (NEURONTIN) 100 MG capsule Take 3 pills by mouth in the am and 2 pills in the pm with caution of sedation   lactulose  (CHRONULAC ) 10 GM/15ML solution TAKE 15 ML BY MOUTH 2 TIMES DAILY AS NEEDED FOR SEVERE CONSTIPATION.   metFORMIN  (GLUCOPHAGE -XR) 500 MG 24 hr tablet Take 500 mg by mouth daily with breakfast.   Multiple Vitamin (MULTIVITAMIN) tablet Take 1 tablet by mouth daily.   Multiple Vitamins-Minerals (PRESERVISION AREDS 2) CAPS Take 1 capsule by mouth 2 (two) times daily.   mupirocin  ointment (BACTROBAN ) 2 % APPLY 1 APPLICATION TOPICALLY 2 TIMES DAILY AS NEEDED.   nadolol  (CORGARD ) 20 MG tablet TAKE 1 TABLET BY MOUTH EVERY DAY   nystatin  (MYCOSTATIN ) 100000 UNIT/ML suspension TAKE 5 MLS (500,000 UNITS TOTAL) BY MOUTH 3 (THREE) TIMES DAILY. SWISH AND SWALLOW   pantoprazole  (PROTONIX ) 40 MG tablet TAKE 1 TABLET (40 MG TOTAL) BY MOUTH TWICE A DAY BEFORE MEALS   Tiotropium Bromide Monohydrate  (SPIRIVA  RESPIMAT) 2.5 MCG/ACT AERS  INHALE 2 PUFFS BY MOUTH INTO THE LUNGS DAILY   ursodiol  (ACTIGALL ) 300 MG capsule TAKE 1 CAPSULE BY MOUTH EVERY DAY   XIFAXAN  550 MG TABS tablet TAKE 1 TABLET BY MOUTH 2 TIMES DAILY.   No current facility-administered medications for this visit. (Other)   REVIEW OF SYSTEMS: ROS   Positive for: Eyes, Allergic/Imm Last edited by Resa Delon ORN, COT on 02/06/2024  1:14 PM.     ALLERGIES Allergies  Allergen Reactions   Anoro Ellipta  [Umeclidinium-Vilanterol] Cough   Bevespi  Aerosphere [Glycopyrrolate-Formoterol ] Cough    Prolonged coughing episode with first dose   Oxycodone  Nausea And Vomiting   Crestor  [Rosuvastatin ]     Abdominal pain      Glipizide  Other (See Comments)    Stomach pain   Losartan      Pt thinks it caused urinary retention     Spironolactone      Urinary retention     PAST MEDICAL HISTORY Past Medical History:  Diagnosis Date   Alcohol abuse, unspecified    Breast cancer (HCC) 1998   Right   Cataract    left eye   Cervical spondylosis 2006   MRI   Degeneration of cervical intervertebral disc 2006   MRI   Diabetes mellitus without complication (HCC)    Diverticulosis of colon (without mention of hemorrhage)    Hyperpotassemia    Lung cancer (HCC) 03/2021   started radiation in January 2023, right side   Microscopic hematuria    Mononeuritis of unspecified site    Nonspecific abnormal results of liver function study    Other abnormal glucose    Other and unspecified hyperlipidemia    no per pt   Other chronic nonalcoholic liver disease    Personal history of chemotherapy    Personal history of malignant neoplasm of breast    Personal history of radiation therapy    Pneumothorax, acute    right, spontaneous   Tobacco use disorder    Unspecified vitamin D  deficiency    Past Surgical History:  Procedure Laterality Date   BIOPSY  07/27/2019   Procedure: BIOPSY;  Surgeon: Leigh Elspeth SQUIBB, MD;  Location: [facility];  Service: Gastroenterology;;   BREAST BIOPSY  9/03   Right   BREAST LUMPECTOMY Right 1998   CHEST TUBE INSERTION  11/02/2014   COLONOSCOPY     COLONOSCOPY WITH PROPOFOL  N/A 07/27/2019   Procedure: COLONOSCOPY WITH PROPOFOL ;  Surgeon: Leigh Elspeth SQUIBB, MD;  Location: [facility];  Service: Gastroenterology;  Laterality: N/A;   COLONOSCOPY WITH PROPOFOL  N/A 07/29/2022   Procedure: COLONOSCOPY WITH PROPOFOL ;  Surgeon: Shila Gustav GAILS, MD;  Location: [facility];  Service: Gastroenterology;  Laterality: N/A;   ESOPHAGOGASTRODUODENOSCOPY (EGD) WITH PROPOFOL  N/A 10/30/2018   Procedure: ESOPHAGOGASTRODUODENOSCOPY (EGD) WITH PROPOFOL ;  Surgeon: Shila Gustav GAILS, MD;   Location: [facility];  Service: Endoscopy;  Laterality: N/A;   ESOPHAGOGASTRODUODENOSCOPY (EGD) WITH PROPOFOL  N/A 03/12/2019   Procedure: ESOPHAGOGASTRODUODENOSCOPY (EGD) WITH PROPOFOL ;  Surgeon: Shila Gustav GAILS, MD;  Location: [facility];  Service: Endoscopy;  Laterality: N/A;   ESOPHAGOGASTRODUODENOSCOPY (EGD) WITH PROPOFOL  N/A 07/27/2019   Procedure: ESOPHAGOGASTRODUODENOSCOPY (EGD) WITH PROPOFOL ;  Surgeon: Leigh Elspeth SQUIBB, MD;  Location: [facility];  Service: Gastroenterology;  Laterality: N/A;   EYE SURGERY  02/2017   cataract extraction with lens implant-left   HEMOSTASIS CLIP PLACEMENT  07/29/2022   Procedure: HEMOSTASIS CLIP PLACEMENT;  Surgeon: Shila Gustav GAILS, MD;  Location: [facility];  Service: Gastroenterology;;   HOT HEMOSTASIS N/A 07/27/2019  Procedure: HOT HEMOSTASIS (ARGON PLASMA COAGULATION/BICAP);  Surgeon: Leigh Elspeth SQUIBB, MD;  Location: [facility];  Service: Gastroenterology;  Laterality: N/A;   MOUTH SURGERY     POLYPECTOMY  07/27/2019   Procedure: POLYPECTOMY;  Surgeon: Leigh Elspeth SQUIBB, MD;  Location: [facility];  Service: Gastroenterology;;   POLYPECTOMY  07/29/2022   Procedure: POLYPECTOMY;  Surgeon: Shila Gustav GAILS, MD;  Location: [facility];  Service: Gastroenterology;;   TUBAL LIGATION     FAMILY HISTORY Family History  Problem Relation Age of Onset   Heart failure Father    Heart attack Father    Colon cancer Maternal Uncle    Stroke Mother    Esophageal cancer Neg Hx    Rectal cancer Neg Hx    Stomach cancer Neg Hx    Pancreatic cancer Neg Hx    SOCIAL HISTORY Social History   Tobacco Use   Smoking status: Every Day    Current packs/day: 1.00    Average packs/day: 1 pack/day for 59.3 years (59.3 ttl pk-yrs)    Types: Cigarettes    Start date: 09/25/2021    Passive exposure: Past   Smokeless tobacco: Never   Tobacco comments:    Started smoking at 73 years old.     1PPD 12/20/2022 khj  Vaping Use   Vaping  status: Never Used  Substance Use Topics   Alcohol use: Not Currently   Drug use: No       OPHTHALMIC EXAM:  Base Eye Exam     Visual Acuity (Snellen - Linear)       Right Left   Dist cc 20/40 20/40   Dist ph cc NI NI         Tonometry (Tonopen, 1:28 PM)       Right Left   Pressure 12 12         Pupils       Pupils Dark Light Shape React APD   Right PERRL 4 3 Round Brisk None   Left PERRL 3 3 Round Minimal very slight         Visual Fields       Left Right    Full Full         Extraocular Movement       Right Left    Full, Ortho Full, Ortho         Neuro/Psych     Oriented x3: Yes   Mood/Affect: Normal         Dilation     Both eyes: 2.5% Phenylephrine @ 1:28 PM           Slit Lamp and Fundus Exam     External Exam       Right Left   External Normal Normal         Slit Lamp Exam       Right Left   Lids/Lashes Normal Normal   Conjunctiva/Sclera White and quiet White and quiet   Cornea Clear Clear   Anterior Chamber Deep and quiet Deep and quiet   Iris Round and reactive Round and reactive   Lens Clear Clear   Anterior Vitreous Normal Normal         Fundus Exam       Right Left   Disc Normal Normal   Macula Normal Normal   Vessels Normal Normal   Periphery Normal Normal           Refraction     Wearing Rx  Sphere Cylinder Axis Add   Right -3.00 +1.00 013 +2.50   Left -3.75 +2.00 016 +2.50         Manifest Refraction   Unable to correct           IMAGING AND PROCEDURES  Imaging and Procedures for 02/06/2024  OCT, Retina - OU - Both Eyes       Right Eye Quality was good. Central Foveal Thickness: 263. Progression has no prior data. Findings include normal foveal contour, no SRF, retinal drusen , subretinal hyper-reflective material, intraretinal hyper-reflective material, outer retinal atrophy (Peripapillary SRHM/edema nasal and temporal of disc seen best on widefield).   Left  Eye Quality was good. Central Foveal Thickness: 266. Progression has no prior data. Findings include no IRF, no SRF, abnormal foveal contour, retinal drusen , intraretinal hyper-reflective material, outer retinal atrophy (Nasal ORA/GA with extension to fovea).   Notes *Images captured and stored on drive  Diagnosis / Impression:  OD: Peripapillary SRHM/edema nasal and temporal of disc seen best on widefield OS: Nasal ORA/GA with extension to fovea Clinical management:  See below  Abbreviations: NFP - Normal foveal profile. CME - cystoid macular edema. PED - pigment epithelial detachment. IRF - intraretinal fluid. SRF - subretinal fluid. EZ - ellipsoid zone. ERM - epiretinal membrane. ORA - outer retinal atrophy. ORT - outer retinal tubulation. SRHM - subretinal hyper-reflective material. IRHM - intraretinal hyper-reflective material           ASSESSMENT/PLAN:   ICD-10-CM   1. Exudative age-related macular degeneration of right eye with active choroidal neovascularization (HCC)  H35.3211 OCT, Retina - OU - Both Eyes    2. Advanced atrophic nonexudative age-related macular degeneration of left eye without subfoveal involvement  H35.3123     3. Diabetes mellitus without complication (HCC)  E11.9     4. Diabetes mellitus treated with oral medication (HCC)  E11.9    Z79.84     5. Combined forms of age-related cataract of right eye  H25.811     6. Pseudophakia  Z96.1      Exudative age related macular degeneration, OD - The incidence pathology and anatomy of wet AMD discussed  - discussed treatment options including observation vs intravitreal anti-VEGF agents such as Avastin, Lucentis, Eylea.   - Risks of endophthalmitis and vascular occlusive events and atrophic changes discussed with patient - OCT shows OD: Peripapillary SRHM/edema nasal and temporal of disc seen best on widefield   - recommend IVA OD today but pt does not have a driver  - pt wishes to be treated with IVA - RBA  of procedure discussed, questions answered - informed consent obtained and signed 10.27.25 - see procedure note - pt will return Wed 10.29.25 DFE OD, OCT, possible IVA OD  - f/u in 4 wks -- DFE/OCT, possible injection   2. Age related macular degeneration, non-exudative, OS - The incidence, anatomy, and pathology of dry AMD, risk of progression, and the AREDS and AREDS 2 studies including smoking risks discussed with patient. - OCT shows OS: Nasal ORA/GA with extension to fovea  - Recommend amsler grid monitoring  3,4. Diabetes mellitus, type 2 without retinopathy  - A1C 6.7 (09.03.25) - The incidence, risk factors for progression, natural history and treatment options for diabetic retinopathy  were discussed with patient.   - The need for close monitoring of blood glucose, blood pressure, and serum lipids, avoiding cigarette or any type of tobacco, and the need for long term follow up was also discussed  with patient. - f/u in 1 year, sooner prn   5. Mixed Cataract OD - The symptoms of cataract, surgical options, and treatments and risks were discussed with patient. - discussed diagnosis and progression - cataract eval scheduled with Dr. Fleeta December 2025 - monitor   6. Pseudophakia OS  - s/p 3 pc CE/IOL w/ PPV, - Dr[facility] Raj at [facility]  - IOL in good position, doing well  - monitor   Ophthalmic Meds Ordered this visit:  No orders of the defined types were placed in this encounter.    No follow-ups on file.  There are no Patient Instructions on file for this visit.  Explained the diagnoses, plan, and follow up with the patient and they expressed understanding.  Patient expressed understanding of the importance of proper follow up care.   This document serves as a record of services personally performed by Redell JUDITHANN Hans, MD, PhD. It was created on their behalf by Avelina Pereyra, COA an ophthalmic technician. The creation of this record is the provider's dictation and/or  activities during the visit.   Electronically signed by: Avelina GORMAN Pereyra, COT  02/06/24  2:21 PM    This document serves as a record of services personally performed by Redell JUDITHANN Hans, MD, PhD. It was created on their behalf by Wanda GEANNIE Keens, COT an ophthalmic technician. The creation of this record is the provider's dictation and/or activities during the visit.    Electronically signed by:  Wanda GEANNIE Keens, COT  02/06/24 2:21 PM  Redell JUDITHANN Hans, M.D., Ph.D. Diseases & Surgery of the Retina and Vitreous [facility] 02/06/2024  Abbreviations: M myopia (nearsighted); A astigmatism; H hyperopia (farsighted); P presbyopia; Mrx spectacle prescription;  CTL contact lenses; OD right eye; OS left eye; OU both eyes  XT exotropia; ET esotropia; PEK punctate epithelial keratitis; PEE punctate epithelial erosions; DES dry eye syndrome; MGD meibomian gland dysfunction; ATs artificial tears; PFAT's preservative free artificial tears; NSC nuclear sclerotic cataract; PSC posterior subcapsular cataract; ERM epi-retinal membrane; PVD posterior vitreous detachment; RD retinal detachment; DM diabetes mellitus; DR diabetic retinopathy; NPDR non-proliferative diabetic retinopathy; PDR proliferative diabetic retinopathy; CSME clinically significant macular edema; DME diabetic macular edema; dbh dot blot hemorrhages; CWS cotton wool spot; POAG primary open angle glaucoma; C/D cup-to-disc ratio; HVF humphrey visual field; GVF goldmann visual field; OCT optical coherence tomography; IOP intraocular pressure; BRVO Branch retinal vein occlusion; CRVO central retinal vein occlusion; CRAO central retinal artery occlusion; BRAO branch retinal artery occlusion; RT retinal tear; SB scleral buckle; PPV pars plana vitrectomy; VH Vitreous hemorrhage; PRP panretinal laser photocoagulation; IVK intravitreal kenalog ; VMT vitreomacular traction; MH Macular hole;  NVD neovascularization of the disc; NVE  neovascularization elsewhere; AREDS age related eye disease study; ARMD age related macular degeneration; POAG primary open angle glaucoma; EBMD epithelial/anterior basement membrane dystrophy; ACIOL anterior chamber intraocular lens; IOL intraocular lens; PCIOL posterior chamber intraocular lens; Phaco/IOL phacoemulsification with intraocular lens placement; PRK photorefractive keratectomy; LASIK laser assisted in situ keratomileusis; HTN hypertension; DM diabetes mellitus; COPD chronic obstructive pulmonary disease

## 2024-01-28 ENCOUNTER — Other Ambulatory Visit: Payer: Self-pay | Admitting: Gastroenterology

## 2024-01-29 ENCOUNTER — Other Ambulatory Visit: Payer: Self-pay | Admitting: Gastroenterology

## 2024-02-01 ENCOUNTER — Encounter (INDEPENDENT_AMBULATORY_CARE_PROVIDER_SITE_OTHER): Admitting: Ophthalmology

## 2024-02-01 DIAGNOSIS — H353132 Nonexudative age-related macular degeneration, bilateral, intermediate dry stage: Secondary | ICD-10-CM

## 2024-02-02 ENCOUNTER — Telehealth: Payer: Self-pay | Admitting: *Deleted

## 2024-02-02 ENCOUNTER — Encounter: Payer: Self-pay | Admitting: Family Medicine

## 2024-02-02 ENCOUNTER — Encounter: Payer: Self-pay | Admitting: Physician Assistant

## 2024-02-02 ENCOUNTER — Ambulatory Visit: Admitting: Pharmacist

## 2024-02-02 NOTE — Telephone Encounter (Signed)
 I just did a Peer to Peer to get her MRI authorized  Auth number [id number]   (sent separately to [place] but will send again)   So hopefully they will get her scheduled soon   Only thought I have is to go up on gabapentin dose again-is she open to that?

## 2024-02-02 NOTE — Telephone Encounter (Signed)
 Copied from CRM #[id number]. Topic: Clinical - Medication Question >> Feb 02, 2024 12:36 PM Thersia C wrote: Reason for CRM: Patient called in would like to leave a message for Dr.Tower stated medication isnt working gabapentin (NEURONTIN) 100 MG capsule , and would like to see if Dr.Tower can do something

## 2024-02-03 ENCOUNTER — Encounter: Payer: Self-pay | Admitting: Family Medicine

## 2024-02-03 NOTE — Telephone Encounter (Signed)
 Patient is scheduled 02/07/24 at 2:00pm for MRI

## 2024-02-04 ENCOUNTER — Other Ambulatory Visit: Payer: Self-pay | Admitting: Adult Health

## 2024-02-04 DIAGNOSIS — F411 Generalized anxiety disorder: Secondary | ICD-10-CM

## 2024-02-06 ENCOUNTER — Encounter (INDEPENDENT_AMBULATORY_CARE_PROVIDER_SITE_OTHER): Payer: Self-pay | Admitting: Ophthalmology

## 2024-02-06 ENCOUNTER — Ambulatory Visit (INDEPENDENT_AMBULATORY_CARE_PROVIDER_SITE_OTHER): Admitting: Ophthalmology

## 2024-02-06 DIAGNOSIS — Z7984 Long term (current) use of oral hypoglycemic drugs: Secondary | ICD-10-CM

## 2024-02-06 DIAGNOSIS — H25811 Combined forms of age-related cataract, right eye: Secondary | ICD-10-CM | POA: Diagnosis not present

## 2024-02-06 DIAGNOSIS — H353211 Exudative age-related macular degeneration, right eye, with active choroidal neovascularization: Secondary | ICD-10-CM

## 2024-02-06 DIAGNOSIS — Z961 Presence of intraocular lens: Secondary | ICD-10-CM

## 2024-02-06 DIAGNOSIS — H3581 Retinal edema: Secondary | ICD-10-CM

## 2024-02-06 DIAGNOSIS — E119 Type 2 diabetes mellitus without complications: Secondary | ICD-10-CM

## 2024-02-06 DIAGNOSIS — H353123 Nonexudative age-related macular degeneration, left eye, advanced atrophic without subfoveal involvement: Secondary | ICD-10-CM | POA: Diagnosis not present

## 2024-02-07 ENCOUNTER — Ambulatory Visit
Admission: RE | Admit: 2024-02-07 | Discharge: 2024-02-07 | Disposition: A | Discharge status: Home / Self Care | Source: Ambulatory Visit | Attending: Family Medicine | Admitting: Family Medicine

## 2024-02-07 DIAGNOSIS — M4804 Spinal stenosis, thoracic region: Secondary | ICD-10-CM | POA: Diagnosis not present

## 2024-02-07 DIAGNOSIS — R0789 Other chest pain: Secondary | ICD-10-CM

## 2024-02-07 DIAGNOSIS — M47814 Spondylosis without myelopathy or radiculopathy, thoracic region: Secondary | ICD-10-CM | POA: Diagnosis not present

## 2024-02-07 DIAGNOSIS — M549 Dorsalgia, unspecified: Secondary | ICD-10-CM

## 2024-02-07 DIAGNOSIS — R10A2 Flank pain, left side: Secondary | ICD-10-CM

## 2024-02-07 DIAGNOSIS — M5124 Other intervertebral disc displacement, thoracic region: Secondary | ICD-10-CM | POA: Diagnosis not present

## 2024-02-07 NOTE — Progress Notes (Shared)
 [facility] - Clinic Note  02/08/2024   CHIEF COMPLAINT Patient presents for No chief complaint on file.  HISTORY OF PRESENT ILLNESS: Christine Barton is a 73 y.o. female who presents to the clinic today for:    Referring physician: Tower, Laine LABOR, MD [address] [place],  [place] [address]  HISTORICAL INFORMATION:  Selected notes from the MEDICAL RECORD NUMBER Referred by Dr. JAMA:  Ocular Hx- PMH-   CURRENT MEDICATIONS: Current Outpatient Medications (Ophthalmic Drugs)  Medication Sig   SYSTANE COMPLETE 0.6 % SOLN Apply 1 drop to eye 2 (two) times daily.   No current facility-administered medications for this visit. (Ophthalmic Drugs)   Current Outpatient Medications (Other)  Medication Sig   acetaminophen  (TYLENOL ) 500 MG tablet Take 500 mg by mouth every 6 (six) hours as needed for moderate pain.   albuterol  (VENTOLIN  HFA) 108 (90 Base) MCG/ACT inhaler INHALE 2 PUFFS INTO THE LUNGS EVERY 4 HOURS AS NEEDED FOR WHEEZE OR FOR SHORTNESS OF BREATH   azithromycin  (ZITHROMAX ) 250 MG tablet Take 2 tablets (500 mg) on  Day 1,  followed by 1 tablet (250 mg) once daily on Days 2 through 5.   cholecalciferol (VITAMIN D3) 25 MCG (1000 UNIT) tablet Take 1,000 Units by mouth daily.   clorazepate  (TRANXENE ) 3.75 MG tablet Take 1 tablet (3.75 mg total) by mouth 3 (three) times daily as needed for anxiety.   cyclobenzaprine  (FLEXERIL ) 10 MG tablet TAKE 1/2 TABLET BY MOUTH AT BEDTIME AS NEEDED FOR MUSCLE SPASMS   desvenlafaxine  (PRISTIQ ) 100 MG 24 hr tablet TAKE 1 TABLET BY MOUTH EVERY DAY   gabapentin (NEURONTIN) 100 MG capsule Take 3 pills by mouth in the am and 2 pills in the pm with caution of sedation   lactulose  (CHRONULAC ) 10 GM/15ML solution TAKE 15 ML BY MOUTH 2 TIMES DAILY AS NEEDED FOR SEVERE CONSTIPATION.   metFORMIN  (GLUCOPHAGE -XR) 500 MG 24 hr tablet Take 500 mg by mouth daily with breakfast.   Multiple Vitamin (MULTIVITAMIN) tablet Take 1 tablet by  mouth daily.   Multiple Vitamins-Minerals (PRESERVISION AREDS 2) CAPS Take 1 capsule by mouth 2 (two) times daily.   mupirocin  ointment (BACTROBAN ) 2 % APPLY 1 APPLICATION TOPICALLY 2 TIMES DAILY AS NEEDED.   nadolol  (CORGARD ) 20 MG tablet TAKE 1 TABLET BY MOUTH EVERY DAY   nystatin  (MYCOSTATIN ) 100000 UNIT/ML suspension TAKE 5 MLS (500,000 UNITS TOTAL) BY MOUTH 3 (THREE) TIMES DAILY. SWISH AND SWALLOW   pantoprazole  (PROTONIX ) 40 MG tablet TAKE 1 TABLET (40 MG TOTAL) BY MOUTH TWICE A DAY BEFORE MEALS   Tiotropium Bromide Monohydrate  (SPIRIVA  RESPIMAT) 2.5 MCG/ACT AERS INHALE 2 PUFFS BY MOUTH INTO THE LUNGS DAILY   ursodiol  (ACTIGALL ) 300 MG capsule TAKE 1 CAPSULE BY MOUTH EVERY DAY   XIFAXAN  550 MG TABS tablet TAKE 1 TABLET BY MOUTH 2 TIMES DAILY.   No current facility-administered medications for this visit. (Other)   REVIEW OF SYSTEMS:   ALLERGIES Allergies  Allergen Reactions   Anoro Ellipta  [Umeclidinium-Vilanterol] Cough   Bevespi  Aerosphere [Glycopyrrolate-Formoterol ] Cough    Prolonged coughing episode with first dose   Oxycodone  Nausea And Vomiting   Crestor  [Rosuvastatin ]     Abdominal pain     Glipizide  Other (See Comments)    Stomach pain   Losartan      Pt thinks it caused urinary retention     Spironolactone      Urinary retention     PAST MEDICAL HISTORY Past Medical History:  Diagnosis Date   Alcohol abuse, unspecified    Breast cancer (HCC) 1998   Right   Cataract    left eye   Cervical spondylosis 2006   MRI   Degeneration of cervical intervertebral disc 2006   MRI   Diabetes mellitus without complication (HCC)    Diverticulosis of colon (without mention of hemorrhage)    Hyperpotassemia    Lung cancer (HCC) 03/2021   started radiation in January 2023, right side   Microscopic hematuria    Mononeuritis of unspecified site    Nonspecific abnormal results of liver function study    Other abnormal glucose    Other and unspecified hyperlipidemia     no per pt   Other chronic nonalcoholic liver disease    Personal history of chemotherapy    Personal history of malignant neoplasm of breast    Personal history of radiation therapy    Pneumothorax, acute    right, spontaneous   Tobacco use disorder    Unspecified vitamin D  deficiency    Past Surgical History:  Procedure Laterality Date   BIOPSY  07/27/2019   Procedure: BIOPSY;  Surgeon: Leigh Elspeth SQUIBB, MD;  Location: [facility];  Service: Gastroenterology;;   BREAST BIOPSY  9/03   Right   BREAST LUMPECTOMY Right 1998   CHEST TUBE INSERTION  11/02/2014   COLONOSCOPY     COLONOSCOPY WITH PROPOFOL  N/A 07/27/2019   Procedure: COLONOSCOPY WITH PROPOFOL ;  Surgeon: Leigh Elspeth SQUIBB, MD;  Location: [facility];  Service: Gastroenterology;  Laterality: N/A;   COLONOSCOPY WITH PROPOFOL  N/A 07/29/2022   Procedure: COLONOSCOPY WITH PROPOFOL ;  Surgeon: Shila Gustav GAILS, MD;  Location: [facility];  Service: Gastroenterology;  Laterality: N/A;   ESOPHAGOGASTRODUODENOSCOPY (EGD) WITH PROPOFOL  N/A 10/30/2018   Procedure: ESOPHAGOGASTRODUODENOSCOPY (EGD) WITH PROPOFOL ;  Surgeon: Shila Gustav GAILS, MD;  Location: [facility];  Service: Endoscopy;  Laterality: N/A;   ESOPHAGOGASTRODUODENOSCOPY (EGD) WITH PROPOFOL  N/A 03/12/2019   Procedure: ESOPHAGOGASTRODUODENOSCOPY (EGD) WITH PROPOFOL ;  Surgeon: Shila Gustav GAILS, MD;  Location: [facility];  Service: Endoscopy;  Laterality: N/A;   ESOPHAGOGASTRODUODENOSCOPY (EGD) WITH PROPOFOL  N/A 07/27/2019   Procedure: ESOPHAGOGASTRODUODENOSCOPY (EGD) WITH PROPOFOL ;  Surgeon: Leigh Elspeth SQUIBB, MD;  Location: [facility];  Service: Gastroenterology;  Laterality: N/A;   EYE SURGERY  02/2017   cataract extraction with lens implant-left   HEMOSTASIS CLIP PLACEMENT  07/29/2022   Procedure: HEMOSTASIS CLIP PLACEMENT;  Surgeon: Shila Gustav GAILS, MD;  Location: [facility];  Service: Gastroenterology;;   HOT HEMOSTASIS N/A 07/27/2019   Procedure:  HOT HEMOSTASIS (ARGON PLASMA COAGULATION/BICAP);  Surgeon: Leigh Elspeth SQUIBB, MD;  Location: [facility];  Service: Gastroenterology;  Laterality: N/A;   MOUTH SURGERY     POLYPECTOMY  07/27/2019   Procedure: POLYPECTOMY;  Surgeon: Leigh Elspeth SQUIBB, MD;  Location: [facility];  Service: Gastroenterology;;   POLYPECTOMY  07/29/2022   Procedure: POLYPECTOMY;  Surgeon: Shila Gustav GAILS, MD;  Location: [facility];  Service: Gastroenterology;;   TUBAL LIGATION     FAMILY HISTORY Family History  Problem Relation Age of Onset   Heart failure Father    Heart attack Father    Colon cancer Maternal Uncle    Stroke Mother    Esophageal cancer Neg Hx    Rectal cancer Neg Hx    Stomach cancer Neg Hx    Pancreatic cancer Neg Hx    SOCIAL HISTORY Social History   Tobacco Use   Smoking status: Every Day    Current packs/day: 1.00  Average packs/day: 1 pack/day for 59.3 years (59.3 ttl pk-yrs)    Types: Cigarettes    Start date: 09/25/2021    Passive exposure: Past   Smokeless tobacco: Never   Tobacco comments:    Started smoking at 73 years old.     1PPD 12/20/2022 khj  Vaping Use   Vaping status: Never Used  Substance Use Topics   Alcohol use: Not Currently   Drug use: No       OPHTHALMIC EXAM:  Not recorded    IMAGING AND PROCEDURES  Imaging and Procedures for 02/08/2024         ASSESSMENT/PLAN:   ICD-10-CM   1. Exudative age-related macular degeneration of right eye with active choroidal neovascularization (HCC)  H35.3211     2. Advanced atrophic nonexudative age-related macular degeneration of left eye without subfoveal involvement  H35.3123     3. Diabetes mellitus without complication (HCC)  E11.9     4. Diabetes mellitus treated with oral medication (HCC)  E11.9    Z79.84     5. Combined forms of age-related cataract of right eye  H25.811     6. Pseudophakia  Z96.1      Exudative age related macular degeneration, OD - The incidence pathology  and anatomy of wet AMD discussed  - discussed treatment options including observation vs intravitreal anti-VEGF agents such as Avastin, Lucentis, Eylea.   - Risks of endophthalmitis and vascular occlusive events and atrophic changes discussed with patient - OCT shows OD: Peripapillary SRHM/edema nasal and temporal of disc seen best on widefield   - recommend IVA OD #1 today, 10.29.25 w/ f/u in 4 weeks  - pt wishes to be treated with IVA - RBA of procedure discussed, questions answered - informed consent obtained and signed 10.29.25 - see procedure note  - f/u in 4 wks -- DFE/OCT, possible injection   2. Age related macular degeneration, non-exudative, OS - The incidence, anatomy, and pathology of dry AMD, risk of progression, and the AREDS and AREDS 2 studies including smoking risks discussed with patient. - OCT shows OS: Nasal ORA/GA with extension to fovea  - Recommend amsler grid monitoring  3,4. Diabetes mellitus, type 2 without retinopathy  - A1C 6.7 (09.03.25) - The incidence, risk factors for progression, natural history and treatment options for diabetic retinopathy  were discussed with patient.   - The need for close monitoring of blood glucose, blood pressure, and serum lipids, avoiding cigarette or any type of tobacco, and the need for long term follow up was also discussed with patient. - f/u in 1 year, sooner prn   5. Mixed Cataract OD - The symptoms of cataract, surgical options, and treatments and risks were discussed with patient. - discussed diagnosis and progression - cataract eval scheduled with Dr. Fleeta December 2025 - monitor   6. Pseudophakia OS  - s/p 3 pc CE/IOL w/ PPV, - Dr. Raj at [facility]  - IOL in good position, doing well  - monitor   Ophthalmic Meds Ordered this visit:  No orders of the defined types were placed in this encounter.    No follow-ups on file.  There are no Patient Instructions on file for this visit.  Explained the  diagnoses, plan, and follow up with the patient and they expressed understanding.  Patient expressed understanding of the importance of proper follow up care.   This document serves as a record of services personally performed by Redell JUDITHANN Hans, MD, PhD. It was created on  their behalf by Almetta Pesa, an ophthalmic technician. The creation of this record is the provider's dictation and/or activities during the visit.    Electronically signed by: Almetta Pesa, OA, 02/07/24  12:47 PM   Redell JUDITHANN Hans, M.D., Ph.D. Diseases & Surgery of the Retina and Vitreous [facility] 02/08/2024  Abbreviations: M myopia (nearsighted); A astigmatism; H hyperopia (farsighted); P presbyopia; Mrx spectacle prescription;  CTL contact lenses; OD right eye; OS left eye; OU both eyes  XT exotropia; ET esotropia; PEK punctate epithelial keratitis; PEE punctate epithelial erosions; DES dry eye syndrome; MGD meibomian gland dysfunction; ATs artificial tears; PFAT's preservative free artificial tears; NSC nuclear sclerotic cataract; PSC posterior subcapsular cataract; ERM epi-retinal membrane; PVD posterior vitreous detachment; RD retinal detachment; DM diabetes mellitus; DR diabetic retinopathy; NPDR non-proliferative diabetic retinopathy; PDR proliferative diabetic retinopathy; CSME clinically significant macular edema; DME diabetic macular edema; dbh dot blot hemorrhages; CWS cotton wool spot; POAG primary open angle glaucoma; C/D cup-to-disc ratio; HVF humphrey visual field; GVF goldmann visual field; OCT optical coherence tomography; IOP intraocular pressure; BRVO Branch retinal vein occlusion; CRVO central retinal vein occlusion; CRAO central retinal artery occlusion; BRAO branch retinal artery occlusion; RT retinal tear; SB scleral buckle; PPV pars plana vitrectomy; VH Vitreous hemorrhage; PRP panretinal laser photocoagulation; IVK intravitreal kenalog ; VMT vitreomacular traction; MH Macular  hole;  NVD neovascularization of the disc; NVE neovascularization elsewhere; AREDS age related eye disease study; ARMD age related macular degeneration; POAG primary open angle glaucoma; EBMD epithelial/anterior basement membrane dystrophy; ACIOL anterior chamber intraocular lens; IOL intraocular lens; PCIOL posterior chamber intraocular lens; Phaco/IOL phacoemulsification with intraocular lens placement; PRK photorefractive keratectomy; LASIK laser assisted in situ keratomileusis; HTN hypertension; DM diabetes mellitus; COPD chronic obstructive pulmonary disease

## 2024-02-08 ENCOUNTER — Ambulatory Visit (INDEPENDENT_AMBULATORY_CARE_PROVIDER_SITE_OTHER): Admitting: Ophthalmology

## 2024-02-08 ENCOUNTER — Encounter (INDEPENDENT_AMBULATORY_CARE_PROVIDER_SITE_OTHER): Payer: Self-pay | Admitting: Ophthalmology

## 2024-02-08 DIAGNOSIS — H25811 Combined forms of age-related cataract, right eye: Secondary | ICD-10-CM

## 2024-02-08 DIAGNOSIS — H353123 Nonexudative age-related macular degeneration, left eye, advanced atrophic without subfoveal involvement: Secondary | ICD-10-CM

## 2024-02-08 DIAGNOSIS — Z7984 Long term (current) use of oral hypoglycemic drugs: Secondary | ICD-10-CM | POA: Diagnosis not present

## 2024-02-08 DIAGNOSIS — H353211 Exudative age-related macular degeneration, right eye, with active choroidal neovascularization: Secondary | ICD-10-CM | POA: Diagnosis not present

## 2024-02-08 DIAGNOSIS — E119 Type 2 diabetes mellitus without complications: Secondary | ICD-10-CM

## 2024-02-08 DIAGNOSIS — Z961 Presence of intraocular lens: Secondary | ICD-10-CM

## 2024-02-08 LAB — HM DIABETES EYE EXAM

## 2024-02-08 MED ORDER — BEVACIZUMAB CHEMO INJECTION 1.25MG/0.05ML SYRINGE FOR KALEIDOSCOPE
1.2500 mg | INTRAVITREAL | Status: AC | PRN
  Administered 2024-02-08: 1.25 mg via INTRAVITREAL

## 2024-02-08 NOTE — Progress Notes (Signed)
 [facility] - Clinic Note  02/08/2024   CHIEF COMPLAINT Patient presents for Retina Follow Up  HISTORY OF PRESENT ILLNESS: Christine Barton is a 73 y.o. female who presents to the clinic today for:  HPI     Retina Follow Up   Patient presents with  Wet AMD.  In right eye.  Duration of 2 days.  I, the attending physician,  performed the HPI with the patient and updated documentation appropriately.        Comments   Pt state no changes in vision/no FOL/no new floaters/pain.      Last edited by Valdemar Rogue, MD on 02/08/2024  5:37 PM.     Here for IVA OD today  Referring physician: Tower, Laine LABOR, MD [address] [place],  [place] [address]  HISTORICAL INFORMATION:  Selected notes from the MEDICAL RECORD NUMBER Referred by Dr. Fleeta for ARMD eval LEE:  Ocular Hx- PMH-   CURRENT MEDICATIONS: Current Outpatient Medications (Ophthalmic Drugs)  Medication Sig   SYSTANE COMPLETE 0.6 % SOLN Apply 1 drop to eye 2 (two) times daily.   No current facility-administered medications for this visit. (Ophthalmic Drugs)   Current Outpatient Medications (Other)  Medication Sig   acetaminophen  (TYLENOL ) 500 MG tablet Take 500 mg by mouth every 6 (six) hours as needed for moderate pain.   albuterol  (VENTOLIN  HFA) 108 (90 Base) MCG/ACT inhaler INHALE 2 PUFFS INTO THE LUNGS EVERY 4 HOURS AS NEEDED FOR WHEEZE OR FOR SHORTNESS OF BREATH   azithromycin  (ZITHROMAX ) 250 MG tablet Take 2 tablets (500 mg) on  Day 1,  followed by 1 tablet (250 mg) once daily on Days 2 through 5.   cholecalciferol (VITAMIN D3) 25 MCG (1000 UNIT) tablet Take 1,000 Units by mouth daily.   clorazepate  (TRANXENE ) 3.75 MG tablet Take 1 tablet (3.75 mg total) by mouth 3 (three) times daily as needed for anxiety.   cyclobenzaprine  (FLEXERIL ) 10 MG tablet TAKE 1/2 TABLET BY MOUTH AT BEDTIME AS NEEDED FOR MUSCLE SPASMS   desvenlafaxine  (PRISTIQ ) 100 MG 24 hr tablet TAKE 1 TABLET BY MOUTH EVERY DAY    gabapentin (NEURONTIN) 100 MG capsule Take 3 pills by mouth in the am and 2 pills in the pm with caution of sedation   lactulose  (CHRONULAC ) 10 GM/15ML solution TAKE 15 ML BY MOUTH 2 TIMES DAILY AS NEEDED FOR SEVERE CONSTIPATION.   metFORMIN  (GLUCOPHAGE -XR) 500 MG 24 hr tablet Take 500 mg by mouth daily with breakfast.   Multiple Vitamin (MULTIVITAMIN) tablet Take 1 tablet by mouth daily.   Multiple Vitamins-Minerals (PRESERVISION AREDS 2) CAPS Take 1 capsule by mouth 2 (two) times daily.   mupirocin  ointment (BACTROBAN ) 2 % APPLY 1 APPLICATION TOPICALLY 2 TIMES DAILY AS NEEDED.   nadolol  (CORGARD ) 20 MG tablet TAKE 1 TABLET BY MOUTH EVERY DAY   nystatin  (MYCOSTATIN ) 100000 UNIT/ML suspension TAKE 5 MLS (500,000 UNITS TOTAL) BY MOUTH 3 (THREE) TIMES DAILY. SWISH AND SWALLOW   pantoprazole  (PROTONIX ) 40 MG tablet TAKE 1 TABLET (40 MG TOTAL) BY MOUTH TWICE A DAY BEFORE MEALS   Tiotropium Bromide Monohydrate  (SPIRIVA  RESPIMAT) 2.5 MCG/ACT AERS INHALE 2 PUFFS BY MOUTH INTO THE LUNGS DAILY   ursodiol  (ACTIGALL ) 300 MG capsule TAKE 1 CAPSULE BY MOUTH EVERY DAY   XIFAXAN  550 MG TABS tablet TAKE 1 TABLET BY MOUTH 2 TIMES DAILY.   No current facility-administered medications for this visit. (Other)   REVIEW OF SYSTEMS: ROS   Positive for: Eyes,  Allergic/Imm Last edited by Elnor Avelina RAMAN, COT on 02/08/2024  1:55 PM.      ALLERGIES Allergies  Allergen Reactions   Anoro Ellipta  [Umeclidinium-Vilanterol] Cough   Bevespi  Aerosphere [Glycopyrrolate-Formoterol ] Cough    Prolonged coughing episode with first dose   Oxycodone  Nausea And Vomiting   Crestor  [Rosuvastatin ]     Abdominal pain     Glipizide  Other (See Comments)    Stomach pain   Losartan      Pt thinks it caused urinary retention     Spironolactone      Urinary retention     PAST MEDICAL HISTORY Past Medical History:  Diagnosis Date   Alcohol abuse, unspecified    Breast cancer (HCC) 1998   Right   Cataract    left eye    Cervical spondylosis 2006   MRI   Degeneration of cervical intervertebral disc 2006   MRI   Diabetes mellitus without complication (HCC)    Diverticulosis of colon (without mention of hemorrhage)    Hyperpotassemia    Lung cancer (HCC) 03/2021   started radiation in January 2023, right side   Microscopic hematuria    Mononeuritis of unspecified site    Nonspecific abnormal results of liver function study    Other abnormal glucose    Other and unspecified hyperlipidemia    no per pt   Other chronic nonalcoholic liver disease    Personal history of chemotherapy    Personal history of malignant neoplasm of breast    Personal history of radiation therapy    Pneumothorax, acute    right, spontaneous   Tobacco use disorder    Unspecified vitamin D  deficiency    Past Surgical History:  Procedure Laterality Date   BIOPSY  07/27/2019   Procedure: BIOPSY;  Surgeon: Leigh Elspeth SQUIBB, MD;  Location: [facility];  Service: Gastroenterology;;   BREAST BIOPSY  9/03   Right   BREAST LUMPECTOMY Right 1998   CHEST TUBE INSERTION  11/02/2014   COLONOSCOPY     COLONOSCOPY WITH PROPOFOL  N/A 07/27/2019   Procedure: COLONOSCOPY WITH PROPOFOL ;  Surgeon: Leigh Elspeth SQUIBB, MD;  Location: [facility];  Service: Gastroenterology;  Laterality: N/A;   COLONOSCOPY WITH PROPOFOL  N/A 07/29/2022   Procedure: COLONOSCOPY WITH PROPOFOL ;  Surgeon: Shila Gustav GAILS, MD;  Location: [facility];  Service: Gastroenterology;  Laterality: N/A;   ESOPHAGOGASTRODUODENOSCOPY (EGD) WITH PROPOFOL  N/A 10/30/2018   Procedure: ESOPHAGOGASTRODUODENOSCOPY (EGD) WITH PROPOFOL ;  Surgeon: Shila Gustav GAILS, MD;  Location: [facility];  Service: Endoscopy;  Laterality: N/A;   ESOPHAGOGASTRODUODENOSCOPY (EGD) WITH PROPOFOL  N/A 03/12/2019   Procedure: ESOPHAGOGASTRODUODENOSCOPY (EGD) WITH PROPOFOL ;  Surgeon: Shila Gustav GAILS, MD;  Location: [facility];  Service: Endoscopy;  Laterality: N/A;    ESOPHAGOGASTRODUODENOSCOPY (EGD) WITH PROPOFOL  N/A 07/27/2019   Procedure: ESOPHAGOGASTRODUODENOSCOPY (EGD) WITH PROPOFOL ;  Surgeon: Leigh Elspeth SQUIBB, MD;  Location: [facility];  Service: Gastroenterology;  Laterality: N/A;   EYE SURGERY  02/2017   cataract extraction with lens implant-left   HEMOSTASIS CLIP PLACEMENT  07/29/2022   Procedure: HEMOSTASIS CLIP PLACEMENT;  Surgeon: Shila Gustav GAILS, MD;  Location: [facility];  Service: Gastroenterology;;   HOT HEMOSTASIS N/A 07/27/2019   Procedure: HOT HEMOSTASIS (ARGON PLASMA COAGULATION/BICAP);  Surgeon: Leigh Elspeth SQUIBB, MD;  Location: [facility];  Service: Gastroenterology;  Laterality: N/A;   MOUTH SURGERY     POLYPECTOMY  07/27/2019   Procedure: POLYPECTOMY;  Surgeon: Leigh Elspeth SQUIBB, MD;  Location: [facility];  Service: Gastroenterology;;   POLYPECTOMY  07/29/2022   Procedure:  POLYPECTOMY;  Surgeon: Shila Gustav GAILS, MD;  Location: [facility];  Service: Gastroenterology;;   TUBAL LIGATION     FAMILY HISTORY Family History  Problem Relation Age of Onset   Heart failure Father    Heart attack Father    Colon cancer Maternal Uncle    Stroke Mother    Esophageal cancer Neg Hx    Rectal cancer Neg Hx    Stomach cancer Neg Hx    Pancreatic cancer Neg Hx    SOCIAL HISTORY Social History   Tobacco Use   Smoking status: Every Day    Current packs/day: 1.00    Average packs/day: 1 pack/day for 59.3 years (59.3 ttl pk-yrs)    Types: Cigarettes    Start date: 09/25/2021    Passive exposure: Past   Smokeless tobacco: Never   Tobacco comments:    Started smoking at 73 years old.     1PPD 12/20/2022 khj  Vaping Use   Vaping status: Never Used  Substance Use Topics   Alcohol use: Not Currently   Drug use: No       OPHTHALMIC EXAM:  Base Eye Exam     Visual Acuity (Snellen - Linear)       Right Left   Dist cc 20/40 20/40   Dist ph cc NI NI    Correction: Glasses         Tonometry (Tonopen, 1:58  PM)       Right Left   Pressure 15 17         Pupils       Pupils Dark Light Shape React APD   Right PERRL 4 3 Round Brisk None   Left PERRL 3 2 Round Minimal None         Visual Fields       Left Right    Full Full         Extraocular Movement       Right Left    Full, Ortho Full, Ortho         Neuro/Psych     Oriented x3: Yes   Mood/Affect: Normal         Dilation     Right eye: 1.0% Mydriacyl, 2.5% Phenylephrine @ 2:01 PM           Slit Lamp and Fundus Exam     External Exam       Right Left   External Normal Normal         Slit Lamp Exam       Right Left   Lids/Lashes Normal Meibomian gland dysfunction   Conjunctiva/Sclera White and quiet White and quiet   Cornea Trace Punctate epithelial erosions, Debris in tear film Trace Punctate epithelial erosions, Arcus, Well healed temporal cataract wound   Anterior Chamber Deep, narrow temporal angle Deep and quiet   Iris Round and dilated, No NVI Round and poorly dilated   Lens 3+ Nuclear sclerosis with brunescence, 3+ Cortical cataract sown in 3PC IOL in good position ( ? akros with mild superior displacement)   Anterior Vitreous Vitreous syneresis post vitrectomy, clear         Fundus Exam       Right Left   Disc Pink and sharp, paripapilary  CNV w/ edema nasal disc Pallor, Sharp rim, Temporal PPA   C/D Ratio 0.2 0.3   Macula Flat, Blunted foveal reflex, RPE mottling, clumping, and atrophy, early GA, no heme, mild edema, mild peripapillary edema nasal mac Flat,  Blunted foveal reflex, Drusen, RPE mottling, clumping, and atrophy, + GA nasal mac with pigment ring   Vessels Vascular attenuation, Tortuous Vascular attenuation, Tortuous   Periphery Attached, Reticular degeneration, No heme Attached, Reticular degeneration, No heme           Refraction     Wearing Rx       Sphere Cylinder Axis Add   Right -3.00 +1.00 013 +2.50   Left -3.75 +2.00 016 +2.50           IMAGING  AND PROCEDURES  Imaging and Procedures for 02/08/2024  OCT, Retina - OU - Both Eyes       Right Eye Quality was good. Central Foveal Thickness: 257. Progression has been stable. Findings include normal foveal contour, no SRF, retinal drusen , subretinal hyper-reflective material, intraretinal hyper-reflective material, outer retinal atrophy (Peripapillary SRHM/edema nasal and temporal of disc seen best on widefield).   Left Eye Quality was good. Central Foveal Thickness: 255. Progression has been stable. Findings include no IRF, no SRF, abnormal foveal contour, retinal drusen , intraretinal hyper-reflective material, outer retinal atrophy (Nasal ORA/GA with extension to fovea).   Notes *Images captured and stored on drive  Diagnosis / Impression:  OD: Peripapillary SRHM/edema nasal and temporal of disc seen best on widefield OS: Nasal ORA/GA with extension to fovea  Clinical management:  See below  Abbreviations: NFP - Normal foveal profile. CME - cystoid macular edema. PED - pigment epithelial detachment. IRF - intraretinal fluid. SRF - subretinal fluid. EZ - ellipsoid zone. ERM - epiretinal membrane. ORA - outer retinal atrophy. ORT - outer retinal tubulation. SRHM - subretinal hyper-reflective material. IRHM - intraretinal hyper-reflective material      Intravitreal Injection, Pharmacologic Agent - OD - Right Eye       Time Out 02/08/2024. 2:58 PM. Confirmed correct patient, procedure, site, and patient consented.   Anesthesia Topical anesthesia was used. Anesthetic medications included Lidocaine  2%, Proparacaine 0.5%.   Procedure Preparation included 5% betadine to ocular surface, eyelid speculum. A supplied needle was used.   Injection: 1.25 mg Bevacizumab 1.25mg /0.75ml   Route: Intravitreal, Site: Right Eye   NDC: 49757-939-98, Lot: [id number], Expiration date: 03/04/2024   Post-op Post injection exam found visual acuity of at least counting fingers. The patient tolerated  the procedure well. There were no complications. The patient received written and verbal post procedure care education. Post injection medications were not given.            ASSESSMENT/PLAN:   ICD-10-CM   1. Exudative age-related macular degeneration of right eye with active choroidal neovascularization (HCC)  H35.3211 OCT, Retina - OU - Both Eyes    Intravitreal Injection, Pharmacologic Agent - OD - Right Eye    Bevacizumab (AVASTIN) SOLN 1.25 mg    2. Advanced atrophic nonexudative age-related macular degeneration of left eye without subfoveal involvement  H35.3123     3. Diabetes mellitus without complication (HCC)  E11.9     4. Diabetes mellitus treated with oral medication (HCC)  E11.9    Z79.84     5. Combined forms of age-related cataract of right eye  H25.811     6. Pseudophakia  Z96.1      Exudative age related macular degeneration, OD - The incidence pathology and anatomy of wet AMD discussed  - discussed treatment options including observation vs intravitreal anti-VEGF agents such as Avastin, Lucentis, Eylea.   - Risks of endophthalmitis and vascular occlusive events and atrophic changes discussed with patient - OCT  shows OD: Peripapillary SRHM/edema nasal and temporal of disc seen best on widefield   - recommend IVA OD #1 today, 10.29.25 for exudative ARMD / peripapillary CNV, w/ f/u in 4 wks - pt wishes to be treated with IVA - RBA of procedure discussed, questions answered - informed consent obtained and signed - see procedure note   - f/u in 4 wks -- DFE/OCT, possible injection   2. Age related macular degeneration, non-exudative, OS - The incidence, anatomy, and pathology of dry AMD, risk of progression, and the AREDS and AREDS 2 studies including smoking risks discussed with patient. - OCT shows OS: Nasal ORA/GA with extension to fovea  - Recommend amsler grid monitoring  3,4. Diabetes mellitus, type 2 without retinopathy  - A1C 6.7 (09.03.25) - The  incidence, risk factors for progression, natural history and treatment options for diabetic retinopathy  were discussed with patient.   - The need for close monitoring of blood glucose, blood pressure, and serum lipids, avoiding cigarette or any type of tobacco, and the need for long term follow up was also discussed with patient. - f/u in 1 year, sooner prn   5. Mixed Cataract OD - The symptoms of cataract, surgical options, and treatments and risks were discussed with patient. - discussed diagnosis and progression - cataract eval scheduled with Dr. Fleeta December 2025 - monitor   6. Pseudophakia OS  - s/p sutured IOL? OS - [facility]  - IOL in good position  - monitor   Ophthalmic Meds Ordered this visit:  Meds ordered this encounter  Medications   Bevacizumab (AVASTIN) SOLN 1.25 mg     Return in about 4 weeks (around 03/07/2024) for exu ARMD OD, DFE, OCT, Possible Injxn.  There are no Patient Instructions on file for this visit.  Explained the diagnoses, plan, and follow up with the patient and they expressed understanding.  Patient expressed understanding of the importance of proper follow up care.   This document serves as a record of services personally performed by Redell JUDITHANN Hans, MD, PhD. It was created on their behalf by Avelina Pereyra, COA an ophthalmic technician. The creation of this record is the provider's dictation and/or activities during the visit.   Electronically signed by: Avelina GORMAN Pereyra, COT  02/08/24  5:38 PM    This document serves as a record of services personally performed by Redell JUDITHANN Hans, MD, PhD. It was created on their behalf by Wanda GEANNIE Keens, COT an ophthalmic technician. The creation of this record is the provider's dictation and/or activities during the visit.    Electronically signed by:  Wanda GEANNIE Keens, COT  02/08/24 5:38 PM  Redell JUDITHANN Hans, M.D., Ph.D. Diseases & Surgery of the Retina and Vitreous [facility] 02/08/2024  I have reviewed the above documentation for accuracy and completeness, and I agree with the above. Redell JUDITHANN Hans, M.D., Ph.D. 02/08/24 5:38 PM   Abbreviations: M myopia (nearsighted); A astigmatism; H hyperopia (farsighted); P presbyopia; Mrx spectacle prescription;  CTL contact lenses; OD right eye; OS left eye; OU both eyes  XT exotropia; ET esotropia; PEK punctate epithelial keratitis; PEE punctate epithelial erosions; DES dry eye syndrome; MGD meibomian gland dysfunction; ATs artificial tears; PFAT's preservative free artificial tears; NSC nuclear sclerotic cataract; PSC posterior subcapsular cataract; ERM epi-retinal membrane; PVD posterior vitreous detachment; RD retinal detachment; DM diabetes mellitus; DR diabetic retinopathy; NPDR non-proliferative diabetic retinopathy; PDR proliferative diabetic retinopathy; CSME clinically significant macular edema;  DME diabetic macular edema; dbh dot blot hemorrhages; CWS cotton wool spot; POAG primary open angle glaucoma; C/D cup-to-disc ratio; HVF humphrey visual field; GVF goldmann visual field; OCT optical coherence tomography; IOP intraocular pressure; BRVO Branch retinal vein occlusion; CRVO central retinal vein occlusion; CRAO central retinal artery occlusion; BRAO branch retinal artery occlusion; RT retinal tear; SB scleral buckle; PPV pars plana vitrectomy; VH Vitreous hemorrhage; PRP panretinal laser photocoagulation; IVK intravitreal kenalog ; VMT vitreomacular traction; MH Macular hole;  NVD neovascularization of the disc; NVE neovascularization elsewhere; AREDS age related eye disease study; ARMD age related macular degeneration; POAG primary open angle glaucoma; EBMD epithelial/anterior basement membrane dystrophy; ACIOL anterior chamber intraocular lens; IOL intraocular lens; PCIOL posterior chamber intraocular lens; Phaco/IOL phacoemulsification with intraocular lens placement; PRK photorefractive keratectomy; LASIK  laser assisted in situ keratomileusis; HTN hypertension; DM diabetes mellitus; COPD chronic obstructive pulmonary disease

## 2024-02-09 ENCOUNTER — Telehealth: Payer: Self-pay

## 2024-02-09 NOTE — Telephone Encounter (Signed)
 Called pt and she said she had a few questions. Pt said she is dealing with this pain issue and also eye issues and she recently was contacted about getting an thyroid  US  but given everything she is dealing with she asked if she could hold off on US  (no appt scheduled). Also pt said she is still in pain but it is better in the morning but pain is still there. Pt said she is taking 3 gabapentin BID. Pt said she is also using heating pad. She has pain clinic appt but not until 02/29/24 she asked if PCP can add something else med wise to help with the pain. Pt also asked what's causing her to be in this much pain. I did advise her that her MRI is not back but will send questions to PCP for review

## 2024-02-09 NOTE — Telephone Encounter (Signed)
 With her current health problems the only thing I can think to add safely would be a lidocaine  patch -(if she is not allergic to numbing medicines) Would she be ok with that? Has she tried anything like that before?   Please see if MRI can be read soon/ stat?  I do not know what is causing her pain  Glad the pain referral is next mo   Thanks

## 2024-02-09 NOTE — Telephone Encounter (Signed)
 Copied from CRM #[id number]. Topic: Clinical - Medical Advice >> Feb 09, 2024  1:26 PM Mercedes MATSU wrote: Reason for CRM: Patient called in wanting to get a call back from Shapell dr. towers nurse in regards to her having pain. Patient can be reached at [phone] patient did not disclose any further. She said she only wants to speak with Shapell.

## 2024-02-10 ENCOUNTER — Ambulatory Visit: Payer: Self-pay | Admitting: Family Medicine

## 2024-02-10 NOTE — Telephone Encounter (Signed)
 Called pt to f/u with PCP's questions and to make sure pt did see MRI results. Pt said she does have patches at home but she woke up today feeling better pt said she doesn't want to stay on the gabapentin due to the side eff she read so she wants to ask Dr. Randeen how to safely taper off of med. Pt also asked if she should still see pain med clinic.   Pt also asked about the thyroid  US  she said PCP didn't addressed that question in prev message   Pt also had another question. Give her pain is starting to feel better does pt need to get a mammogram. In the beginning of this issue pt said PCP reccommended a mammogram but she is asking if she still needs to get that done.

## 2024-02-10 NOTE — Telephone Encounter (Signed)
 Glad she is doing better  Go down to gabapentin 100 mg twice daily for 4-5 d then stop  Her pain has gone away in past and come back , so keep the pain management appointment   Please schedule follow up to discuss thyroid  imaging and mammogram Thanks

## 2024-02-13 NOTE — Telephone Encounter (Signed)
 Called patient, phone went to voicemail and Voicemail was full so no message was left.

## 2024-02-14 ENCOUNTER — Ambulatory Visit: Admitting: Gastroenterology

## 2024-02-17 NOTE — Telephone Encounter (Signed)
 Called pt and no answer and pt's VM box still full

## 2024-02-20 ENCOUNTER — Other Ambulatory Visit: Payer: Self-pay

## 2024-02-24 NOTE — Progress Notes (Signed)
 [facility] - Clinic Note  03/07/2024   CHIEF COMPLAINT Patient presents for Retina Follow Up  HISTORY OF PRESENT ILLNESS: Christine Barton is a 73 y.o. female who presents to the clinic today for:  HPI     Retina Follow Up   Patient presents with  Wet AMD.  In both eyes.  This started 4 weeks ago.  Duration of 4 weeks.  Since onset it is stable.        Comments   4 week retina follow up ARMD and IVA OD pt is reporting no vision changes noticed she denies any flashes some floaters pt is not currently checking her blood sugar at this time A1C 6.7       Last edited by Resa Delon ORN, COT on 03/07/2024  9:38 AM.      Pt states she saw double after first inj OD. Didn't last long.   Referring physician: Tower, Laine LABOR, MD [address] [place],  [place] [address]  HISTORICAL INFORMATION:  Selected notes from the MEDICAL RECORD NUMBER Referred by Dr. Fleeta for ARMD eval LEE:  Ocular Hx- PMH-   CURRENT MEDICATIONS: Current Outpatient Medications (Ophthalmic Drugs)  Medication Sig   SYSTANE COMPLETE 0.6 % SOLN Apply 1 drop to eye 2 (two) times daily.   No current facility-administered medications for this visit. (Ophthalmic Drugs)   Current Outpatient Medications (Other)  Medication Sig   acetaminophen  (TYLENOL ) 500 MG tablet Take 500 mg by mouth every 6 (six) hours as needed for moderate pain.   albuterol  (VENTOLIN  HFA) 108 (90 Base) MCG/ACT inhaler INHALE 2 PUFFS INTO THE LUNGS EVERY 4 HOURS AS NEEDED FOR WHEEZE OR FOR SHORTNESS OF BREATH   azithromycin  (ZITHROMAX ) 250 MG tablet Take 2 tablets (500 mg) on  Day 1,  followed by 1 tablet (250 mg) once daily on Days 2 through 5.   cholecalciferol (VITAMIN D3) 25 MCG (1000 UNIT) tablet Take 1,000 Units by mouth daily.   clorazepate  (TRANXENE ) 3.75 MG tablet Take 1 tablet (3.75 mg total) by mouth 3 (three) times daily as needed for anxiety.   cyclobenzaprine  (FLEXERIL ) 10 MG tablet TAKE 1/2 TABLET  BY MOUTH AT BEDTIME AS NEEDED FOR MUSCLE SPASMS   desvenlafaxine  (PRISTIQ ) 100 MG 24 hr tablet Take 1 tablet (100 mg total) by mouth daily.   gabapentin  (NEURONTIN ) 100 MG capsule Take 3 pills by mouth in the am and 2 pills in the pm with caution of sedation   lactulose  (CHRONULAC ) 10 GM/15ML solution TAKE 15 ML BY MOUTH 2 TIMES DAILY AS NEEDED FOR SEVERE CONSTIPATION.   metFORMIN  (GLUCOPHAGE -XR) 500 MG 24 hr tablet Take 1 tablet (500 mg total) by mouth daily with breakfast.   Multiple Vitamin (MULTIVITAMIN) tablet Take 1 tablet by mouth daily.   Multiple Vitamins-Minerals (PRESERVISION AREDS 2) CAPS Take 1 capsule by mouth 2 (two) times daily.   mupirocin  ointment (BACTROBAN ) 2 % APPLY 1 APPLICATION TOPICALLY 2 TIMES DAILY AS NEEDED.   nadolol  (CORGARD ) 20 MG tablet TAKE 1 TABLET BY MOUTH EVERY DAY   nystatin  (MYCOSTATIN ) 100000 UNIT/ML suspension TAKE 5 MLS (500,000 UNITS TOTAL) BY MOUTH 3 (THREE) TIMES DAILY. SWISH AND SWALLOW   pantoprazole  (PROTONIX ) 40 MG tablet TAKE 1 TABLET (40 MG TOTAL) BY MOUTH TWICE A DAY BEFORE MEALS   predniSONE  (DELTASONE ) 20 MG tablet Take 3 tablets (60 mg total) by mouth daily with breakfast for 3 days, THEN 2 tablets (40 mg total) daily with breakfast for  3 days, THEN 1 tablet (20 mg total) daily with breakfast for 3 days.   Tiotropium Bromide Monohydrate  (SPIRIVA  RESPIMAT) 2.5 MCG/ACT AERS INHALE 2 PUFFS BY MOUTH INTO THE LUNGS DAILY   traMADol  (ULTRAM ) 50 MG tablet TAKE 1 TABLET (50 MG TOTAL) BY MOUTH EVERY 8 (EIGHT) HOURS AS NEEDED FOR UP TO 5 DAYS.   ursodiol  (ACTIGALL ) 300 MG capsule TAKE 1 CAPSULE BY MOUTH EVERY DAY   XIFAXAN  550 MG TABS tablet TAKE 1 TABLET BY MOUTH 2 TIMES DAILY.   No current facility-administered medications for this visit. (Other)   REVIEW OF SYSTEMS: ROS   Positive for: Eyes, Allergic/Imm Last edited by Resa Delon ORN, COT on 03/07/2024  9:38 AM.       ALLERGIES Allergies  Allergen Reactions   Anoro Ellipta   [Umeclidinium-Vilanterol] Cough   Bevespi  Aerosphere [Glycopyrrolate-Formoterol ] Cough    Prolonged coughing episode with first dose   Oxycodone  Nausea And Vomiting   Crestor  [Rosuvastatin ]     Abdominal pain     Glipizide  Other (See Comments)    Stomach pain   Losartan      Pt thinks it caused urinary retention     Spironolactone      Urinary retention     PAST MEDICAL HISTORY Past Medical History:  Diagnosis Date   Alcohol abuse, unspecified    Breast cancer (HCC) 1998   Right   Cataract    left eye   Cervical spondylosis 2006   MRI   Degeneration of cervical intervertebral disc 2006   MRI   Diabetes mellitus without complication (HCC)    Diverticulosis of colon (without mention of hemorrhage)    Hyperpotassemia    Lung cancer (HCC) 03/2021   started radiation in January 2023, right side   Microscopic hematuria    Mononeuritis of unspecified site    Nonspecific abnormal results of liver function study    Other abnormal glucose    Other and unspecified hyperlipidemia    no per pt   Other chronic nonalcoholic liver disease    Personal history of chemotherapy    Personal history of malignant neoplasm of breast    Personal history of radiation therapy    Pneumothorax, acute    right, spontaneous   Tobacco use disorder    Unspecified vitamin D  deficiency    Past Surgical History:  Procedure Laterality Date   BIOPSY  07/27/2019   Procedure: BIOPSY;  Surgeon: Leigh Elspeth SQUIBB, MD;  Location: [facility];  Service: Gastroenterology;;   BREAST BIOPSY  9/03   Right   BREAST LUMPECTOMY Right 1998   CHEST TUBE INSERTION  11/02/2014   COLONOSCOPY     COLONOSCOPY WITH PROPOFOL  N/A 07/27/2019   Procedure: COLONOSCOPY WITH PROPOFOL ;  Surgeon: Leigh Elspeth SQUIBB, MD;  Location: [facility];  Service: Gastroenterology;  Laterality: N/A;   COLONOSCOPY WITH PROPOFOL  N/A 07/29/2022   Procedure: COLONOSCOPY WITH PROPOFOL ;  Surgeon: Shila Gustav GAILS, MD;  Location: [facility];  Service: Gastroenterology;  Laterality: N/A;   ESOPHAGOGASTRODUODENOSCOPY (EGD) WITH PROPOFOL  N/A 10/30/2018   Procedure: ESOPHAGOGASTRODUODENOSCOPY (EGD) WITH PROPOFOL ;  Surgeon: Shila Gustav GAILS, MD;  Location: [facility];  Service: Endoscopy;  Laterality: N/A;   ESOPHAGOGASTRODUODENOSCOPY (EGD) WITH PROPOFOL  N/A 03/12/2019   Procedure: ESOPHAGOGASTRODUODENOSCOPY (EGD) WITH PROPOFOL ;  Surgeon: Shila Gustav GAILS, MD;  Location: [facility];  Service: Endoscopy;  Laterality: N/A;   ESOPHAGOGASTRODUODENOSCOPY (EGD) WITH PROPOFOL  N/A 07/27/2019   Procedure: ESOPHAGOGASTRODUODENOSCOPY (EGD) WITH PROPOFOL ;  Surgeon: Leigh Elspeth SQUIBB, MD;  Location: [facility];  Service: Gastroenterology;  Laterality: N/A;   EYE SURGERY  02/2017   cataract extraction with lens implant-left   HEMOSTASIS CLIP PLACEMENT  07/29/2022   Procedure: HEMOSTASIS CLIP PLACEMENT;  Surgeon: Shila Gustav GAILS, MD;  Location: [facility];  Service: Gastroenterology;;   HOT HEMOSTASIS N/A 07/27/2019   Procedure: HOT HEMOSTASIS (ARGON PLASMA COAGULATION/BICAP);  Surgeon: Leigh Elspeth SQUIBB, MD;  Location: [facility];  Service: Gastroenterology;  Laterality: N/A;   MOUTH SURGERY     POLYPECTOMY  07/27/2019   Procedure: POLYPECTOMY;  Surgeon: Leigh Elspeth SQUIBB, MD;  Location: [facility];  Service: Gastroenterology;;   POLYPECTOMY  07/29/2022   Procedure: POLYPECTOMY;  Surgeon: Shila Gustav GAILS, MD;  Location: [facility];  Service: Gastroenterology;;   TUBAL LIGATION     FAMILY HISTORY Family History  Problem Relation Age of Onset   Heart failure Father    Heart attack Father    Colon cancer Maternal Uncle    Stroke Mother    Esophageal cancer Neg Hx    Rectal cancer Neg Hx    Stomach cancer Neg Hx    Pancreatic cancer Neg Hx    SOCIAL HISTORY Social History   Tobacco Use   Smoking status: Every Day    Current packs/day: 1.00    Average packs/day: 1 pack/day for 59.3 years (59.3 ttl  pk-yrs)    Types: Cigarettes    Start date: 09/25/2021    Passive exposure: Past   Smokeless tobacco: Never   Tobacco comments:    Started smoking at 73 years old.     1PPD 12/20/2022 khj  Vaping Use   Vaping status: Never Used  Substance Use Topics   Alcohol use: Not Currently   Drug use: No       OPHTHALMIC EXAM:  Base Eye Exam     Visual Acuity (Snellen - Linear)       Right Left   Dist cc 20/40 -2 20/30 -2   Dist ph cc NI NI         Tonometry (Tonopen, 9:43 AM)       Right Left   Pressure 14 18         Pupils       Pupils Dark Light Shape React APD   Right PERRL 3 2 Round Brisk None   Left PERRL 3 2 Round Brisk None         Visual Fields       Left Right    Full Full         Extraocular Movement       Right Left    Full, Ortho Full, Ortho         Neuro/Psych     Oriented x3: Yes   Mood/Affect: Normal         Dilation     Both eyes: 2.5% Phenylephrine @ 9:43 AM           Slit Lamp and Fundus Exam     External Exam       Right Left   External Normal Normal         Slit Lamp Exam       Right Left   Lids/Lashes Normal Meibomian gland dysfunction   Conjunctiva/Sclera White and quiet White and quiet   Cornea Trace Punctate epithelial erosions, Debris in tear film Trace Punctate epithelial erosions, Arcus, Well healed temporal cataract wound   Anterior Chamber Deep, narrow temporal angle Deep and quiet   Iris Round and dilated, No  NVI Round and poorly dilated   Lens 3+ Nuclear sclerosis with brunescence, 3+ Cortical cataract sown in 3PC IOL in good position ( ? akros with mild superior displacement)   Anterior Vitreous Vitreous syneresis post vitrectomy, clear         Fundus Exam       Right Left   Disc Pink and sharp, paripapilary  CNV w/ edema nasal disc Pallor, Sharp rim, Temporal PPA   C/D Ratio 0.2 0.3   Macula Flat, Blunted foveal reflex, RPE mottling, clumping, and atrophy, early GA, no heme, mild edema, mild  peripapillary edema nasal mac Flat, Blunted foveal reflex, Drusen, RPE mottling, clumping, and atrophy, + GA nasal mac with pigment ring   Vessels Vascular attenuation, Tortuous Vascular attenuation, Tortuous   Periphery Attached, Reticular degeneration, No heme Attached, Reticular degeneration, No heme           Refraction     Wearing Rx       Sphere Cylinder Axis Add   Right -3.00 +1.00 013 +2.50   Left -3.75 +2.00 016 +2.50           IMAGING AND PROCEDURES  Imaging and Procedures for 03/07/2024  OCT, Retina - OU - Both Eyes       Right Eye Quality was good. Central Foveal Thickness: 281. Progression has improved. Findings include normal foveal contour, no SRF, retinal drusen , subretinal hyper-reflective material, intraretinal hyper-reflective material, outer retinal atrophy (Peripapillary SRHM/edema nasal and temporal to disc seen best on widefield--improved).   Left Eye Quality was good. Central Foveal Thickness: 271. Progression has been stable. Findings include no IRF, no SRF, abnormal foveal contour, retinal drusen , intraretinal hyper-reflective material, outer retinal atrophy (Nasal ORA/GA with extension to fovea).   Notes *Images captured and stored on drive  Diagnosis / Impression:  OD: Peripapillary SRHM/edema nasal and temporal of disc seen best on widefield OS: Nasal ORA/GA with extension to fovea  Clinical management:  See below  Abbreviations: NFP - Normal foveal profile. CME - cystoid macular edema. PED - pigment epithelial detachment. IRF - intraretinal fluid. SRF - subretinal fluid. EZ - ellipsoid zone. ERM - epiretinal membrane. ORA - outer retinal atrophy. ORT - outer retinal tubulation. SRHM - subretinal hyper-reflective material. IRHM - intraretinal hyper-reflective material      Intravitreal Injection, Pharmacologic Agent - OD - Right Eye       Time Out 03/07/2024. 11:06 AM. Confirmed correct patient, procedure, site, and patient consented.    Anesthesia Topical anesthesia was used. Anesthetic medications included Lidocaine  2%, Proparacaine 0.5%.   Procedure Preparation included 5% betadine to ocular surface, eyelid speculum. A supplied needle was used.   Injection: 1.25 mg Bevacizumab  1.25mg /0.28ml   Route: Intravitreal, Site: Right Eye   NDC: [id number]   Post-op Post injection exam found visual acuity of at least counting fingers. The patient tolerated the procedure well. There were no complications. The patient received written and verbal post procedure care education. Post injection medications were not given.             ASSESSMENT/PLAN:   ICD-10-CM   1. Exudative age-related macular degeneration of right eye with active choroidal neovascularization (HCC)  H35.3211 OCT, Retina - OU - Both Eyes    Intravitreal Injection, Pharmacologic Agent - OD - Right Eye    2. Advanced atrophic nonexudative age-related macular degeneration of left eye without subfoveal involvement  H35.3123     3. Diabetes mellitus without complication (HCC)  E11.9  4. Diabetes mellitus treated with oral medication (HCC)  E11.9    Z79.84     5. Combined forms of age-related cataract of right eye  H25.811     6. Pseudophakia  Z96.1      Exudative age related macular degeneration, OD - The incidence pathology and anatomy of wet AMD discussed  - discussed treatment options including observation vs intravitreal anti-VEGF agents such as Avastin , Lucentis, Eylea.   - Risks of endophthalmitis and vascular occlusive events and atrophic changes discussed with patient - s/p IVA OD #1 (10.29.25) - OCT shows OD: Peripapillary SRHM/edema nasal and temporal to disc seen best on widefield   - recommend IVA OD #2 today, 11.26.25 for exudative ARMD / peripapillary CNV, w/ f/u in 6 wks due to holiday - pt wishes to be treated with IVA - RBA of procedure discussed, questions answered - informed consent obtained and signed - see procedure note    - f/u in 6 wks -- DFE/OCT, possible injection   2. Age related macular degeneration, non-exudative, OS - The incidence, anatomy, and pathology of dry AMD, risk of progression, and the AREDS and AREDS 2 studies including smoking risks discussed with patient. - OCT shows OS: Nasal ORA/GA with extension to fovea  - Recommend amsler grid monitoring  3,4. Diabetes mellitus, type 2 without retinopathy  - A1C 6.7 (09.03.25) - The incidence, risk factors for progression, natural history and treatment options for diabetic retinopathy  were discussed with patient.   - The need for close monitoring of blood glucose, blood pressure, and serum lipids, avoiding cigarette or any type of tobacco, and the need for long term follow up was also discussed with patient. - f/u in 1 year, sooner prn   5. Mixed Cataract OD - The symptoms of cataract, surgical options, and treatments and risks were discussed with patient. - discussed diagnosis and progression - cataract eval scheduled with Dr. Fleeta 12.12. 2025 - monitor   6. Pseudophakia OS  - s/p sutured IOL? OS - Dr[facility] Raj at [facility]  - IOL in good position  - monitor   Ophthalmic Meds Ordered this visit:  No orders of the defined types were placed in this encounter.    Return in about 6 weeks (around 04/18/2024) for exu ARMD OD, DFE, OCT.  There are no Patient Instructions on file for this visit.  Explained the diagnoses, plan, and follow up with the patient and they expressed understanding.  Patient expressed understanding of the importance of proper follow up care.   This document serves as a record of services personally performed by Redell JUDITHANN Hans, MD, PhD. It was created on their behalf by Almetta Pesa, an ophthalmic technician. The creation of this record is the provider's dictation and/or activities during the visit.    Electronically signed by: Almetta Pesa, OA, 03/07/24  11:08 AM  This document serves as a record of  services personally performed by Redell JUDITHANN Hans, MD, PhD. It was created on their behalf by Wanda GEANNIE Keens, COT an ophthalmic technician. The creation of this record is the provider's dictation and/or activities during the visit.    Electronically signed by:  Wanda GEANNIE Keens, COT  03/07/24 11:08 AM  Redell JUDITHANN Hans, M.D., Ph.D. Diseases & Surgery of the Retina and Vitreous [facility] 03/07/2024   Abbreviations: M myopia (nearsighted); A astigmatism; H hyperopia (farsighted); P presbyopia; Mrx spectacle prescription;  CTL contact lenses; OD right eye; OS left eye; OU both  eyes  XT exotropia; ET esotropia; PEK punctate epithelial keratitis; PEE punctate epithelial erosions; DES dry eye syndrome; MGD meibomian gland dysfunction; ATs artificial tears; PFAT's preservative free artificial tears; NSC nuclear sclerotic cataract; PSC posterior subcapsular cataract; ERM epi-retinal membrane; PVD posterior vitreous detachment; RD retinal detachment; DM diabetes mellitus; DR diabetic retinopathy; NPDR non-proliferative diabetic retinopathy; PDR proliferative diabetic retinopathy; CSME clinically significant macular edema; DME diabetic macular edema; dbh dot blot hemorrhages; CWS cotton wool spot; POAG primary open angle glaucoma; C/D cup-to-disc ratio; HVF humphrey visual field; GVF goldmann visual field; OCT optical coherence tomography; IOP intraocular pressure; BRVO Branch retinal vein occlusion; CRVO central retinal vein occlusion; CRAO central retinal artery occlusion; BRAO branch retinal artery occlusion; RT retinal tear; SB scleral buckle; PPV pars plana vitrectomy; VH Vitreous hemorrhage; PRP panretinal laser photocoagulation; IVK intravitreal kenalog ; VMT vitreomacular traction; MH Macular hole;  NVD neovascularization of the disc; NVE neovascularization elsewhere; AREDS age related eye disease study; ARMD age related macular degeneration; POAG primary open angle glaucoma;  EBMD epithelial/anterior basement membrane dystrophy; ACIOL anterior chamber intraocular lens; IOL intraocular lens; PCIOL posterior chamber intraocular lens; Phaco/IOL phacoemulsification with intraocular lens placement; PRK photorefractive keratectomy; LASIK laser assisted in situ keratomileusis; HTN hypertension; DM diabetes mellitus; COPD chronic obstructive pulmonary disease

## 2024-02-27 ENCOUNTER — Encounter: Payer: Self-pay | Admitting: Adult Health

## 2024-02-27 ENCOUNTER — Ambulatory Visit: Admitting: Adult Health

## 2024-02-27 DIAGNOSIS — F411 Generalized anxiety disorder: Secondary | ICD-10-CM

## 2024-02-27 DIAGNOSIS — G4701 Insomnia due to medical condition: Secondary | ICD-10-CM

## 2024-02-27 MED ORDER — CLORAZEPATE DIPOTASSIUM 3.75 MG PO TABS: 3.7500 mg | ORAL_TABLET | Freq: Three times a day (TID) | ORAL | 2 refills | Status: AC | PRN

## 2024-02-27 MED ORDER — DESVENLAFAXINE SUCCINATE ER 100 MG PO TB24: 100.0000 mg | ORAL_TABLET | Freq: Every day | ORAL | 5 refills | Status: DC

## 2024-02-27 NOTE — Progress Notes (Addendum)
 Christine Barton [id number] [birth year] 73 y.o.  Subjective:   Patient ID:  Christine Barton is a 73 y.o. (DOB [birth year]) female.  Chief Complaint: No chief complaint on file.   HPI Christine Barton presents to the office today for follow-up of GAD and insomnia.   Describes mood today as ok. Pleasant. Reports tearfulness. Mood symptoms - denies decreased depression, anxiety and irritability. Reports improved interest and motivation - it's better. Denies panic attacks  Denies worry, rumination and over thinking. Reports mood is stable. Stating I feel like I'm doing good. Feels like current medications are helpful Pristiq  100mg . Taking medications as prescribed. Energy levels lower with feeling bad. Active, does not have a regular exercise routine.  Enjoys some usual interests and activities. Lives alone with 5 cats. Lives next to daughter. Spending time with family. Appetite decreased - a little better, trying to eat more. Weight stable - 112 pounds. Sleeps well most nights. Averages 7 to 8 hours. Focus and concentration stable. Completing tasks. Managing aspects of household. Retired from [employer]. Denies SI or HI.  Denies AH or VH. Denies self harm. Denies substance use. Has seen a therapist previously.   GAD-7    Flowsheet Row Office Visit from 01/03/2024 in [facility] Visit from 12/22/2023 in [facility] Office Visit from 07/22/2023 in [facility] at [facility] Office Visit from 01/31/2023 in [facility] at [facility] Office Visit from 12/29/2022 in [facility]  Total GAD-7 Score 0 0 3 0 0   Mini-Mental    Flowsheet Row Clinical Support from 11/06/2018 in [facility] [place] [facility] Clinical Support from 10/31/2017 in [facility] [employer]  Total Score (max 30 points ) 17 20    PHQ2-9    Flowsheet Row Office Visit from 01/03/2024 in [facility] Visit from 12/22/2023 in [facility] Office Visit from 07/22/2023 in [facility] Clinical Support from 04/01/2023 in [facility] [place] Office Visit from 01/31/2023 in [facility]  PHQ-2 Total Score 3 0 0 0 0  PHQ-9 Total Score 12 0 2 -- 1   Flowsheet Row ED from 12/07/2023 in [facility] Department at [facility] Admission (Discharged) from 07/29/2022 in [facility] ED from 07/27/2021 in [facility] Emergency Department at [facility]  C-SSRS RISK CATEGORY No Risk No Risk No Risk     Review of Systems:  Review of Systems  Musculoskeletal:  Negative for gait problem.  Neurological:  Negative for tremors.  Psychiatric/Behavioral:         Please refer to HPI    Medications: I have reviewed the patient's current medications.  Current Outpatient Medications  Medication Sig Dispense Refill   acetaminophen  (TYLENOL ) 500 MG tablet Take 500 mg by mouth every 6 (six) hours as needed for moderate pain.     albuterol  (VENTOLIN  HFA) 108 (90 Base) MCG/ACT inhaler INHALE 2 PUFFS INTO THE LUNGS EVERY 4 HOURS AS NEEDED FOR WHEEZE OR FOR SHORTNESS OF BREATH 8.5 each 2   azithromycin  (ZITHROMAX ) 250 MG tablet Take 2 tablets (500 mg) on  Day 1,  followed by 1 tablet (250 mg) once daily on Days 2 through 5. 6 each 0  cholecalciferol (VITAMIN D3) 25 MCG (1000 UNIT) tablet Take 1,000 Units by mouth daily.     clorazepate  (TRANXENE ) 3.75 MG tablet Take 1 tablet (3.75 mg total) by mouth 3 (three) times daily as needed for anxiety. 90 tablet 2   cyclobenzaprine  (FLEXERIL ) 10 MG tablet TAKE 1/2 TABLET BY MOUTH AT BEDTIME AS NEEDED FOR MUSCLE SPASMS 15 tablet 3   desvenlafaxine  (PRISTIQ ) 100 MG 24 hr tablet TAKE 1 TABLET BY  MOUTH EVERY DAY 30 tablet 0   gabapentin (NEURONTIN) 100 MG capsule Take 3 pills by mouth in the am and 2 pills in the pm with caution of sedation 150 capsule 2   lactulose  (CHRONULAC ) 10 GM/15ML solution TAKE 15 ML BY MOUTH 2 TIMES DAILY AS NEEDED FOR SEVERE CONSTIPATION. 2700 mL 1   metFORMIN  (GLUCOPHAGE -XR) 500 MG 24 hr tablet Take 500 mg by mouth daily with breakfast.     Multiple Vitamin (MULTIVITAMIN) tablet Take 1 tablet by mouth daily.     Multiple Vitamins-Minerals (PRESERVISION AREDS 2) CAPS Take 1 capsule by mouth 2 (two) times daily.     mupirocin  ointment (BACTROBAN ) 2 % APPLY 1 APPLICATION TOPICALLY 2 TIMES DAILY AS NEEDED. 15 g 3   nadolol  (CORGARD ) 20 MG tablet TAKE 1 TABLET BY MOUTH EVERY DAY 90 tablet 3   nystatin  (MYCOSTATIN ) 100000 UNIT/ML suspension TAKE 5 MLS (500,000 UNITS TOTAL) BY MOUTH 3 (THREE) TIMES DAILY. SWISH AND SWALLOW 120 mL 0   pantoprazole  (PROTONIX ) 40 MG tablet TAKE 1 TABLET (40 MG TOTAL) BY MOUTH TWICE A DAY BEFORE MEALS 180 tablet 1   SYSTANE COMPLETE 0.6 % SOLN Apply 1 drop to eye 2 (two) times daily.     Tiotropium Bromide Monohydrate  (SPIRIVA  RESPIMAT) 2.5 MCG/ACT AERS INHALE 2 PUFFS BY MOUTH INTO THE LUNGS DAILY 4 g 11   ursodiol  (ACTIGALL ) 300 MG capsule TAKE 1 CAPSULE BY MOUTH EVERY DAY 90 capsule 3   XIFAXAN  550 MG TABS tablet TAKE 1 TABLET BY MOUTH 2 TIMES DAILY. 180 tablet 3   No current facility-administered medications for this visit.    Medication Side Effects: None  Allergies:  Allergies  Allergen Reactions   Anoro Ellipta  [Umeclidinium-Vilanterol] Cough   Bevespi  Aerosphere [Glycopyrrolate-Formoterol ] Cough    Prolonged coughing episode with first dose   Oxycodone  Nausea And Vomiting   Crestor  [Rosuvastatin ]     Abdominal pain     Glipizide  Other (See Comments)    Stomach pain   Losartan      Pt thinks it caused urinary retention     Spironolactone      Urinary retention      Past Medical History:  Diagnosis Date   Alcohol  abuse, unspecified    Breast cancer (HCC) 1998   Right   Cataract    left eye   Cervical spondylosis 2006   MRI   Degeneration of cervical intervertebral disc 2006   MRI   Diabetes mellitus without complication (HCC)    Diverticulosis of colon (without mention of hemorrhage)    Hyperpotassemia    Lung cancer (HCC) 03/2021   started radiation in January 2023, right side   Microscopic hematuria    Mononeuritis of unspecified site    Nonspecific abnormal results of liver function study    Other abnormal glucose    Other and unspecified hyperlipidemia    no per pt   Other chronic nonalcoholic liver disease    Personal history of chemotherapy    Personal history of malignant neoplasm of breast  Personal history of radiation therapy    Pneumothorax, acute    right, spontaneous   Tobacco use disorder    Unspecified vitamin D  deficiency     Past Medical History, Surgical history, Social history, and Family history were reviewed and updated as appropriate.   Please see review of systems for further details on the patient's review from today.   Objective:   Physical Exam:  LMP  (LMP Unknown)   Physical Exam Constitutional:      General: She is not in acute distress. Musculoskeletal:        General: No deformity.  Neurological:     Mental Status: She is alert and oriented to person, place, and time.     Coordination: Coordination normal.  Psychiatric:        Attention and Perception: Attention and perception normal. She does not perceive auditory or visual hallucinations.        Mood and Affect: Mood normal. Mood is not anxious or depressed. Affect is not labile, blunt, angry or inappropriate.        Speech: Speech normal.        Behavior: Behavior normal.        Thought Content: Thought content normal. Thought content is not paranoid or delusional. Thought content does not include homicidal or suicidal ideation. Thought content does not include homicidal or suicidal plan.         Cognition and Memory: Cognition and memory normal.        Judgment: Judgment normal.     Comments: Insight intact     Lab Review:     Component Value Date/Time   NA 135 12/27/2023 0913   NA 132 (L) 05/26/2021 1147   K 4.8 12/27/2023 0913   CL 96 12/27/2023 0913   CO2 32 12/27/2023 0913   GLUCOSE 117 (H) 12/27/2023 0913   BUN 14 12/27/2023 0913   BUN 21 05/26/2021 1147   CREATININE 0.66 12/27/2023 0913   CREATININE 0.77 10/17/2023 1311   CREATININE 0.65 07/22/2023 1508   CALCIUM  10.4 12/27/2023 0913   PROT 7.2 12/27/2023 0913   PROT 7.1 05/26/2021 1147   ALBUMIN  4.3 12/27/2023 0913   ALBUMIN  4.4 05/26/2021 1147   AST 20 12/27/2023 0913   AST 18 10/17/2023 1311   ALT 9 12/27/2023 0913   ALT 8 10/17/2023 1311   ALKPHOS 76 12/27/2023 0913   BILITOT 0.5 12/27/2023 0913   BILITOT 0.5 10/17/2023 1311   GFRNONAA >60 12/07/2023 1536   GFRNONAA >60 10/17/2023 1311   GFRAA 83 12/11/2019 1617       Component Value Date/Time   WBC 8.2 12/27/2023 0913   RBC 4.56 12/27/2023 0913   HGB 15.0 12/27/2023 0913   HGB 14.2 10/17/2023 1311   HGB 12.4 12/11/2019 1617   HCT 44.3 12/27/2023 0913   HCT 39.3 12/11/2019 1617   PLT 232.0 12/27/2023 0913   PLT 174 10/17/2023 1311   PLT 120 (L) 12/11/2019 1617   MCV 97.1 12/27/2023 0913   MCV 86 12/11/2019 1617   MCH 33.3 12/07/2023 1536   MCHC 33.9 12/27/2023 0913   RDW 15.2 12/27/2023 0913   RDW 17.0 (H) 12/11/2019 1617   LYMPHSABS 2.1 12/27/2023 0913   MONOABS 0.7 12/27/2023 0913   EOSABS 0.1 12/27/2023 0913   BASOSABS 0.0 12/27/2023 0913    No results found for: POCLITH, LITHIUM   No results found for: PHENYTOIN, PHENOBARB, VALPROATE, CBMZ   .res Assessment: Plan:    Plan:  PDMP reviewed  1. Tranxene  3.75 TID prn anxiety 2. Pristiq  100mg  daily  RTC 6 months  15 minutes spent dedicated to the care of this patient on the date of this encounter to include pre-visit review of records, ordering of  medication, post visit documentation, and face-to-face time with the patient discussing GAD and insomnia. Discussed continuing current medication regimen.  Patient advised to contact office with any questions, adverse effects, or acute worsening in signs and symptoms.  Discussed potential benefits, risk, and side effects of benzodiazepines to include potential risk of tolerance and dependence, as well as possible drowsiness. Advised patient not to drive if experiencing drowsiness and to take lowest possible effective dose to minimize risk of dependence and tolerance.   There are no diagnoses linked to this encounter.   Please see After Visit Summary for patient specific instructions.  Future Appointments  Date Time Provider Department Center  02/27/2024 12:00 PM Merdis Snodgrass, Angeline Mattocks, NP [facility] None  02/29/2024  1:00 PM Tanya Glisson, MD [facility] None  03/06/2024  2:45 PM Tamea Dedra CROME, MD [facility] [address]  03/07/2024  9:45 AM Valdemar Rogue, MD [facility] None  04/03/2024  2:20 PM [facility] ANNUAL WELLNESS VISIT 1 [facility] [address]  04/10/2024  2:00 PM Tower, Laine LABOR, MD [facility] [address]  04/19/2024  2:10 PM Shila Gustav GAILS, MD [facility]  10/15/2024 12:15 PM [facility] None  10/22/2024  3:15 PM Sherrod Sherrod, MD [facility] None    No orders of the defined types were placed in this encounter.   -------------------------------

## 2024-02-29 ENCOUNTER — Ambulatory Visit: Attending: Pain Medicine | Admitting: Pain Medicine

## 2024-02-29 ENCOUNTER — Encounter: Payer: Self-pay | Admitting: Pain Medicine

## 2024-02-29 ENCOUNTER — Other Ambulatory Visit: Payer: Self-pay | Admitting: Family Medicine

## 2024-02-29 ENCOUNTER — Other Ambulatory Visit: Payer: Self-pay | Admitting: Pulmonary Disease

## 2024-02-29 VITALS — BP 132/79 | HR 79 | Temp 97.3°F | Resp 16 | Ht 64.5 in | Wt 112.0 lb

## 2024-02-29 DIAGNOSIS — M5414 Radiculopathy, thoracic region: Secondary | ICD-10-CM | POA: Diagnosis not present

## 2024-02-29 DIAGNOSIS — M5134 Other intervertebral disc degeneration, thoracic region: Secondary | ICD-10-CM | POA: Diagnosis not present

## 2024-02-29 DIAGNOSIS — Z789 Other specified health status: Secondary | ICD-10-CM | POA: Insufficient documentation

## 2024-02-29 DIAGNOSIS — M899 Disorder of bone, unspecified: Secondary | ICD-10-CM

## 2024-02-29 DIAGNOSIS — G8929 Other chronic pain: Secondary | ICD-10-CM | POA: Insufficient documentation

## 2024-02-29 DIAGNOSIS — Z79899 Other long term (current) drug therapy: Secondary | ICD-10-CM | POA: Diagnosis not present

## 2024-02-29 DIAGNOSIS — R937 Abnormal findings on diagnostic imaging of other parts of musculoskeletal system: Secondary | ICD-10-CM

## 2024-02-29 DIAGNOSIS — G894 Chronic pain syndrome: Secondary | ICD-10-CM

## 2024-02-29 DIAGNOSIS — M546 Pain in thoracic spine: Secondary | ICD-10-CM | POA: Insufficient documentation

## 2024-02-29 MED ORDER — PREDNISONE 20 MG PO TABS: ORAL_TABLET | ORAL | 0 refills | Status: AC

## 2024-02-29 NOTE — Progress Notes (Signed)
 Safety precautions to be maintained throughout the outpatient stay will include: orient to surroundings, keep bed in low position, maintain call bell within reach at all times, provide assistance with transfer out of bed and ambulation.   Patient had coughing issue, used inhaler. She states that she did not take medication today because she wanted MD to see  the full effort

## 2024-02-29 NOTE — Progress Notes (Signed)
 PROVIDER NOTE: Interpretation of information contained herein should be left to medically-trained personnel. Specific patient instructions are provided elsewhere under Patient Instructions section of medical record. This document was created in part using AI and STT-dictation technology, any transcriptional errors that may result from this process are unintentional.  Patient: Christine Barton  Service: E/M Encounter  Provider: Eric DELENA Como, MD  DOB: [birth year]  Delivery: Face-to-face  Specialty: Interventional Pain Management  MRN: [id number]  Setting: Ambulatory outpatient facility  Specialty designation: 09  Type: New Patient  Location: Outpatient office facility  PCP: Randeen Laine DELENA, MD  DOS: 02/29/2024    Referring Prov.: Tower, Laine DELENA, MD   Primary Reason(s) for Visit: Encounter for initial evaluation of one or more chronic problems (new to examiner) potentially causing chronic pain, and posing a threat to normal musculoskeletal function. (Level of risk: High) CC: Chest Pain  HPI  Christine Barton is a 73 y.o. year old, female patient, who comes for the first time to our practice referred by Randeen Laine DELENA, MD for our initial evaluation of her chronic pain. She has Vitamin D  deficiency; Hyperlipidemia associated with type 2 diabetes mellitus (HCC); History of alcohol abuse; Neuropathy of both feet; DIVERTICULOSIS, COLON; Fatty liver; Degeneration of cervical intervertebral disc; Personal history of malignant neoplasm of breast; POSTMENOPAUSAL STATUS; Routine general medical examination at a health care facility; Left shoulder pain; Encounter for routine gynecological examination; Colon cancer screening; Estrogen deficiency; Smoker; Cirrhosis of liver (HCC); Gallstones; Heme positive stool; Poor balance; Anemia; Esophageal varices without bleeding (HCC); Hyponatremia; Venous insufficiency; Encounter for screening for lung cancer; Aortic atherosclerosis; Coronary atherosclerosis; HSV infection;  Malnutrition; NASH (nonalcoholic steatohepatitis); AVM (arteriovenous malformation) of colon; Benign neoplasm of colon; Iron  deficiency anemia due to chronic blood loss; Iron  deficiency anemia; Lung cancer (HCC); Left-sided chest wall pain; Marasmus; Agatston CAC score, >400; Pulmonary emphysema (HCC); Wheezing; Osteopenia; Current use of proton pump inhibitor; Food insecurity; Depression with anxiety; Microalbuminuria; Left lateral abdominal pain; Left flank pain; Multiple thyroid  nodules; Encounter for screening mammogram for breast cancer; Diabetes mellitus treated with oral medication (HCC); Mid back pain on left side; Chronic pain syndrome; Pharmacologic therapy; Disorder of skeletal system; Problems influencing health status; Abnormal MRI, thoracic spine (02/10/2024); DDD (degenerative disc disease), thoracic; Thoracic radiculitis (T5) (Left); and Chronic thoracic back pain (Left) on their problem list. Today she comes in for evaluation of her Chest Pain  Pain Assessment: Location: Left, Mid Chest Radiating: pain starts under left breast and radiates around to mid back Onset: More than a month ago (3 months ago) Duration: Chronic pain Quality: Aching, Burning, Constant, Restless, Discomfort, Sharp, Shooting, Sore Severity: 4 /10 (subjective, self-reported pain score)  Effect on ADL: anything that involves left arm i woke up with it one morning and pain has not left Timing: Constant Modifying factors: Heating pad BP: 132/79  HR: 79  Onset and Duration: Sudden and Present longer than 3 months Cause of pain: Unknown Severity: Getting better, NAS-11 at its worse: 10/10, NAS-11 at its best: 3/10, NAS-11 now: 10/10, and NAS-11 on the average: 8/10 Timing: Morning and Night Aggravating Factors: Bending, Lifiting, Squatting, and Twisting Alleviating Factors: Hot packs and Resting Associated Problems: Constipation, Fatigue, and Inability to concentrate Quality of Pain: Aching, Itching, Sharp,  Shooting, Stabbing, Tender, and Throbbing Previous Examinations or Tests: CT scan, MRI scan, and X-rays Previous Treatments: Stretching exercises  Christine Barton is being evaluated for possible interventional pain management therapies for the treatment of her chronic pain.  Discussed the  use of AI scribe software for clinical note transcription with the patient, who gave verbal consent to proceed.  History of Present Illness   Christine Barton is a 73 year old female with controlled diabetes who presents with left-sided thoracic pain. She was referred to the clinic for evaluation of left-sided thoracic radiculitis.  She experiences left-sided thoracic pain for three months, starting suddenly at the end of August. The pain originates under the breast and wraps around to the back. It is intermittent and partially relieved by gabapentin and Tylenol . Gabapentin reduces the pain but does not eliminate it, with pain recurring midday before the next dose.  There is no history of surgeries in the affected area and no rashes. Initially, significant itching accompanied the pain.  She has controlled diabetes managed with metformin . She continues to smoke and has experienced coughing spells.        Christine Barton has been informed that this initial visit was an evaluation only.  On the follow up appointment I will go over the results, including ordered tests and available interventional therapies. At that time she will have the opportunity to decide whether to proceed with offered therapies or not. In the event that Christine Barton prefers avoiding interventional options, this will conclude our involvement in the case.  Medication management recommendations may be provided upon request.  Patient informed that diagnostic tests may be ordered to assist in identifying underlying causes, narrow the list of differential diagnoses and aid in determining candidacy for (or contraindications to) planned therapeutic  interventions.  Historic Controlled Substance Pharmacotherapy Review PMP and historical list of controlled substances: Gabapentin 100 mg capsule, 1 cap p.o. 5 times per day (150/month) (# 150) (last filled on 02/18/2024); clonazepam 3.75 mg tablet, 1 tab p.o. 3 times daily (90/month) (last filled on 01/14/2024); tramadol  50 mg tablet, 1 tab p.o. 3 times daily (# 15) (last filled on 01/03/2024) Most recently prescribed controlled substance(s): Opioid Analgesic: None MME/day: 0 mg/day  Historical Monitoring: The patient  reports no history of drug use. List of prior UDS Testing: Lab Results  Component Value Date   ETH <10 07/25/2019   Historical Background Evaluation: [place] PMP: PDMP reviewed during this encounter. Review of the past 56-months conducted.             PMP NARX Score Report:  Narcotic: 280 Sedative: 270 Stimulant: 000 [place] Department of public safety, offender search: Engineer, [employer] Information) Non-contributory Risk Assessment Profile: Aberrant behavior: None observed or detected today Risk factors for fatal opioid overdose: None identified today PMP NARX Overdose Risk Score: 030 Fatal overdose hazard ratio (HR): Calculation deferred Non-fatal overdose hazard ratio (HR): Calculation deferred Risk of opioid abuse or dependence: 0.7-3.0% with doses <= 36 MME/day and 6.1-26% with doses >= 120 MME/day. Substance use disorder (SUD) risk level: See below Personal History of Substance Abuse (SUD-Substance use disorder):  Alcohol: Positive Female or Female (cirr of liver)  Illegal Drugs: Negative  Rx Drugs: Negative  ORT Risk Level calculation: Moderate Risk  Opioid Risk Tool - 02/29/24 1323       Family History of Substance Abuse   Alcohol Negative    Illegal Drugs Negative    Rx Drugs Negative      Personal History of Substance Abuse   Alcohol Positive Female or Female   cirr of liver   Illegal Drugs Negative    Rx Drugs Negative      Psychological Disease   Psychological  Disease Positive    ADD Negative  OCD Negative    Bipolar Negative    Schizophrenia Negative    Depression Positive      Total Score   Opioid Risk Tool Scoring 6    Opioid Risk Interpretation Moderate Risk         ORT Scoring interpretation table:  Score <3 = Low Risk for SUD  Score between 4-7 = Moderate Risk for SUD  Score >8 = High Risk for Opioid Abuse   PHQ-2 Depression Scale:  Total score: 3  PHQ-2 Scoring interpretation table: (Score and probability of major depressive disorder)  Score 0 = No depression  Score 1 = 15.4% Probability  Score 2 = 21.1% Probability  Score 3 = 38.4% Probability  Score 4 = 45.5% Probability  Score 5 = 56.4% Probability  Score 6 = 78.6% Probability   PHQ-9 Depression Scale:  Total score: 12  PHQ-9 Scoring interpretation table:  Score 0-4 = No depression  Score 5-9 = Mild depression  Score 10-14 = Moderate depression  Score 15-19 = Moderately severe depression  Score 20-27 = Severe depression (2.4 times higher risk of SUD and 2.89 times higher risk of overuse)   Pharmacologic Plan: As per protocol, I have not taken over any controlled substance management, pending the results of ordered tests and/or consults.            Initial impression: Pending review of available data and ordered tests.  Meds   Current Outpatient Medications:    acetaminophen  (TYLENOL ) 500 MG tablet, Take 500 mg by mouth every 6 (six) hours as needed for moderate pain., Disp: , Rfl:    albuterol  (VENTOLIN  HFA) 108 (90 Base) MCG/ACT inhaler, INHALE 2 PUFFS INTO THE LUNGS EVERY 4 HOURS AS NEEDED FOR WHEEZE OR FOR SHORTNESS OF BREATH, Disp: 8.5 each, Rfl: 2   azithromycin  (ZITHROMAX ) 250 MG tablet, Take 2 tablets (500 mg) on  Day 1,  followed by 1 tablet (250 mg) once daily on Days 2 through 5., Disp: 6 each, Rfl: 0   cholecalciferol (VITAMIN D3) 25 MCG (1000 UNIT) tablet, Take 1,000 Units by mouth daily., Disp: , Rfl:    clorazepate  (TRANXENE ) 3.75 MG tablet, Take 1  tablet (3.75 mg total) by mouth 3 (three) times daily as needed for anxiety., Disp: 90 tablet, Rfl: 2   cyclobenzaprine  (FLEXERIL ) 10 MG tablet, TAKE 1/2 TABLET BY MOUTH AT BEDTIME AS NEEDED FOR MUSCLE SPASMS, Disp: 15 tablet, Rfl: 3   desvenlafaxine  (PRISTIQ ) 100 MG 24 hr tablet, Take 1 tablet (100 mg total) by mouth daily., Disp: 30 tablet, Rfl: 5   gabapentin  (NEURONTIN ) 100 MG capsule, Take 3 pills by mouth in the am and 2 pills in the pm with caution of sedation, Disp: 150 capsule, Rfl: 2   lactulose  (CHRONULAC ) 10 GM/15ML solution, TAKE 15 ML BY MOUTH 2 TIMES DAILY AS NEEDED FOR SEVERE CONSTIPATION., Disp: 2700 mL, Rfl: 1   metFORMIN  (GLUCOPHAGE -XR) 500 MG 24 hr tablet, Take 500 mg by mouth daily with breakfast., Disp: , Rfl:    Multiple Vitamin (MULTIVITAMIN) tablet, Take 1 tablet by mouth daily., Disp: , Rfl:    Multiple Vitamins-Minerals (PRESERVISION AREDS 2) CAPS, Take 1 capsule by mouth 2 (two) times daily., Disp: , Rfl:    mupirocin  ointment (BACTROBAN ) 2 %, APPLY 1 APPLICATION TOPICALLY 2 TIMES DAILY AS NEEDED., Disp: 15 g, Rfl: 3   nadolol  (CORGARD ) 20 MG tablet, TAKE 1 TABLET BY MOUTH EVERY DAY, Disp: 90 tablet, Rfl: 3   nystatin  (MYCOSTATIN ) 100000 UNIT/ML suspension,  TAKE 5 MLS (500,000 UNITS TOTAL) BY MOUTH 3 (THREE) TIMES DAILY. SWISH AND SWALLOW, Disp: 120 mL, Rfl: 0   pantoprazole  (PROTONIX ) 40 MG tablet, TAKE 1 TABLET (40 MG TOTAL) BY MOUTH TWICE A DAY BEFORE MEALS, Disp: 180 tablet, Rfl: 1   predniSONE  (DELTASONE ) 20 MG tablet, Take 3 tablets (60 mg total) by mouth daily with breakfast for 3 days, THEN 2 tablets (40 mg total) daily with breakfast for 3 days, THEN 1 tablet (20 mg total) daily with breakfast for 3 days., Disp: 18 tablet, Rfl: 0   SYSTANE COMPLETE 0.6 % SOLN, Apply 1 drop to eye 2 (two) times daily., Disp: , Rfl:    Tiotropium Bromide Monohydrate  (SPIRIVA  RESPIMAT) 2.5 MCG/ACT AERS, INHALE 2 PUFFS BY MOUTH INTO THE LUNGS DAILY, Disp: 4 g, Rfl: 11   ursodiol   (ACTIGALL ) 300 MG capsule, TAKE 1 CAPSULE BY MOUTH EVERY DAY, Disp: 90 capsule, Rfl: 3   XIFAXAN  550 MG TABS tablet, TAKE 1 TABLET BY MOUTH 2 TIMES DAILY., Disp: 180 tablet, Rfl: 3  Imaging Review  Shoulder Imaging: Shoulder-L DG: Results for orders placed in visit on 07/15/21 DG Shoulder Left  Narrative CLINICAL DATA:  Left shoulder pain that extends across anterior chest.  EXAM: LEFT SHOULDER - 2+ VIEW  COMPARISON:  None.  FINDINGS: Moderate degenerative change in the shoulder joint. AC joint intact. Normal alignment no fracture.  IMPRESSION: Moderate osteoarthritis in the shoulder joint.   Electronically Signed By: Carlin Gaskins M.D. On: 07/16/2021 17:06  Thoracic Imaging: Thoracic MR wo contrast: Results for orders placed during the hospital encounter of 02/07/24 MR Thoracic Spine Wo Contrast  Narrative EXAM: MRI THORACIC SPINE WITHOUT INTRAVENOUS CONTRAST 02/07/2024 02:57:22 PM  TECHNIQUE: Multiplanar multisequence MRI of the thoracic spine was performed without the administration of intravenous contrast.  COMPARISON: Thoracic spine series dated 12/22/2023.  CLINICAL HISTORY: Mid back pain, spondyloarthropathy suspected, trunk numbness or tingling, radicular pain in left chest and flank, suspect thoracic radiculopathy.  FINDINGS:  BONES AND ALIGNMENT: Normal alignment. Normal vertebral body heights. Bone marrow signal is unremarkable. No abnormal enhancement. The vertebral bodies maintain their height and alignment and are normal in signal intensity.  SPINAL CORD: Normal spinal cord volume. Normal spinal cord signal. The spinal cord is normal in morphology and signal intensity.  SOFT TISSUES: Unremarkable.  DEGENERATIVE CHANGES: Mild disc space narrowing and very mild disc bulging are present at T2-T3, T3-T4, T4-T5, and T10-T11. There is no significant spinal canal or neural foraminal stenosis at any of these levels. The spinal canal and neural  foramina are widely patent.  IMPRESSION: 1. No spinal canal stenosis or neural foraminal narrowing in the thoracic spine. 2. Mild multilevel disc bulging at T2-3, T3-4, T4-5, and T10-11 without significant stenosis. Thoracic spinal cord normal in morphology and signal.  Electronically signed by: Evalene Coho MD 02/10/2024 09:52 AM EDT RP Workstation: [id number]  Thoracic DG 2-3 views: Results for orders placed in visit on 12/22/23 DG Thoracic Spine 2 View  Narrative CLINICAL DATA:  Left flank pain.  EXAM: DG THORACIC SPINE 2V  COMPARISON:  October 17, 2023.  FINDINGS: There is no evidence of thoracic spine fracture. Alignment is normal. No other significant bone abnormalities are identified.  IMPRESSION: Negative.   Electronically Signed By: Lynwood Landy Raddle M.D. On: 12/22/2023 13:47  Lumbosacral Imaging: Lumbar MR wo contrast: Results for orders placed during the hospital encounter of 02/04/19 MR LUMBAR SPINE WO CONTRAST  Narrative CLINICAL DATA:  Chronic low back pain for the  past year.  EXAM: MRI LUMBAR SPINE WITHOUT CONTRAST  TECHNIQUE: Multiplanar, multisequence MR imaging of the lumbar spine was performed. No intravenous contrast was administered.  COMPARISON:  Lumbar spine x-rays dated December 07, 2018.  FINDINGS: Segmentation:  Standard.  Alignment:  Trace retrolisthesis at L2-L3.  Vertebrae: Diffuse marrow heterogeneity without focal lesion. No fracture or evidence of discitis.  Conus medullaris and cauda equina: Conus extends to the L1 level. Conus and cauda equina appear normal.  Paraspinal and other soft tissues: Negative.  Disc levels:  T12-L1:  Negative.  L1-L2: Mild diffuse disc bulging with anterior endplate spurring. No stenosis.  L2-L3: Mild diffuse disc bulging. Mild right facet arthropathy. No stenosis.  L3-L4:  Small left foraminal disc protrusion.  No stenosis.  L4-L5: Small broad-based posterior disc protrusion. Mild  bilateral lateral recess stenosis. No spinal canal or neuroforaminal stenosis.  L5-S1: Negative disc. Mild bilateral facet arthropathy. No stenosis.  IMPRESSION: 1. Mild multilevel lumbar spondylosis as described above. No significant stenosis or impingement.   Electronically Signed By: Elsie ONEIDA Shoulder M.D. On: 02/05/2019 09:53  Lumbar DG (Complete) 4+V: Results for orders placed during the hospital encounter of 12/07/18 DG Lumbar Spine Complete  Narrative CLINICAL DATA:  Acute low back pain. Initial encounter.  EXAM: LUMBAR SPINE - COMPLETE 4+ VIEW  COMPARISON:  None.  FINDINGS: Five non rib-bearing lumbar type vertebra are identified in normal alignment.  There is no evidence of acute fracture or subluxation.  Moderate degenerative disc disease at L1-2 and L2-3 noted.  No focal bony lesions or spondylolysis.  Aortic atherosclerotic calcifications are present.  IMPRESSION: 1. No acute abnormality. 2. Moderate degenerative disc disease at L1-2 and L2-3. 3.  Aortic Atherosclerosis (ICD10-I70.0).   Electronically Signed By: Reyes Phi M.D. On: 12/07/2018 16:12  Knee Imaging: Knee-R DG 4 views: Results for orders placed during the hospital encounter of 07/27/21 DG Knee Complete 4 Views Right  Narrative CLINICAL DATA:  Multiple falls, right suprapatellar abrasion, left infrapatellar bruising  EXAM: RIGHT KNEE - COMPLETE 4+ VIEW; LEFT KNEE - COMPLETE 4+ VIEW  COMPARISON:  02/16/2017  FINDINGS: Left knee: Frontal, bilateral oblique, lateral views are obtained. No acute displaced fracture, subluxation, or dislocation. Minimal medial compartmental joint space narrowing. No joint effusion. Significant prepatellar and infrapatellar subcutaneous edema consistent with direct trauma. Atherosclerosis.  Right knee: Frontal, bilateral oblique, lateral views are obtained. No acute fracture, subluxation, or dislocation. Joint spaces are well preserved. No joint  effusion. Soft tissues are unremarkable. Atherosclerosis.  IMPRESSION: 1. No acute displaced fracture within either knee. 2. Subcutaneous edema within the left prepatellar and infrapatellar regions, consistent with direct trauma. 3. Mild left knee osteoarthritis.   Electronically Signed By: Ozell Daring M.D. On: 07/27/2021 16:36  Knee-L DG 4 views: Results for orders placed during the hospital encounter of 07/27/21 DG Knee Complete 4 Views Left  Narrative CLINICAL DATA:  Multiple falls, right suprapatellar abrasion, left infrapatellar bruising  EXAM: RIGHT KNEE - COMPLETE 4+ VIEW; LEFT KNEE - COMPLETE 4+ VIEW  COMPARISON:  02/16/2017  FINDINGS: Left knee: Frontal, bilateral oblique, lateral views are obtained. No acute displaced fracture, subluxation, or dislocation. Minimal medial compartmental joint space narrowing. No joint effusion. Significant prepatellar and infrapatellar subcutaneous edema consistent with direct trauma. Atherosclerosis.  Right knee: Frontal, bilateral oblique, lateral views are obtained. No acute fracture, subluxation, or dislocation. Joint spaces are well preserved. No joint effusion. Soft tissues are unremarkable. Atherosclerosis.  IMPRESSION: 1. No acute displaced fracture within either knee. 2. Subcutaneous edema  within the left prepatellar and infrapatellar regions, consistent with direct trauma. 3. Mild left knee osteoarthritis.   Electronically Signed By: Ozell Daring M.D. On: 07/27/2021 16:36  Complexity Note: Imaging results reviewed.                         ROS  Cardiovascular: Heart trouble and Chest pain Pulmonary or Respiratory: Lung problems, Difficulty blowing air out (Emphysema), and Shortness of breath Neurological: No reported neurological signs or symptoms such as seizures, abnormal skin sensations, urinary and/or fecal incontinence, being born with an abnormal open spine and/or a tethered spinal  cord Psychological-Psychiatric: Anxiousness Gastrointestinal: Scarred liver (Cirrhosis) and Irregular, infrequent bowel movements (Constipation) Genitourinary: No reported renal or genitourinary signs or symptoms such as difficulty voiding or producing urine, peeing blood, non-functioning kidney, kidney stones, difficulty emptying the bladder, difficulty controlling the flow of urine, or chronic kidney disease Hematological: Brusing easily Endocrine: High blood sugar requiring insulin  (IDDM) Rheumatologic: Constant unexplained fatigue (Chronic Fatigue Syndrome) Musculoskeletal: Negative for myasthenia gravis, muscular dystrophy, multiple sclerosis or malignant hyperthermia Work History: Out on work excuse  Allergies  Christine Barton is allergic to anoro ellipta  [umeclidinium-vilanterol], bevespi  aerosphere [glycopyrrolate-formoterol ], oxycodone , crestor  [rosuvastatin ], glipizide , losartan , and spironolactone .  Laboratory Chemistry Profile   Renal Lab Results  Component Value Date   BUN 14 12/27/2023   CREATININE 0.66 12/27/2023   LABCREA 44 11/02/2019   BCR SEE NOTE: 07/22/2023   GFR 87.26 12/27/2023   GFRAA 83 12/11/2019   GFRNONAA >60 12/07/2023   SPECGRAV 1.010 12/22/2023   PHUR 6.5 12/22/2023   PROTEINUR Negative 12/22/2023     Electrolytes Lab Results  Component Value Date   NA 135 12/27/2023   K 4.8 12/27/2023   CL 96 12/27/2023   CALCIUM  10.4 12/27/2023   MG 1.5 (L) 07/27/2019   PHOS 3.5 07/26/2019     Hepatic Lab Results  Component Value Date   AST 20 12/27/2023   ALT 9 12/27/2023   ALBUMIN  4.3 12/27/2023   ALKPHOS 76 12/27/2023   LIPASE 24.0 12/14/2023   AMMONIA 17 07/25/2019     ID Lab Results  Component Value Date   HIV NON REACTIVE 07/26/2019   SARSCOV2NAA NEGATIVE 07/25/2019   HCVAB NEGATIVE 07/30/2011     Bone Lab Results  Component Value Date   VD25OH 92.49 12/27/2023     Endocrine Lab Results  Component Value Date   GLUCOSE 117 (H)  12/27/2023   GLUCOSEU NEGATIVE 07/27/2021   HGBA1C 6.7 (H) 12/14/2023   TSH 2.55 12/27/2023     Neuropathy Lab Results  Component Value Date   VITAMINB12 324 12/27/2023   FOLATE >24.8 10/08/2019   HGBA1C 6.7 (H) 12/14/2023   HIV NON REACTIVE 07/26/2019     CNS No results found for: COLORCSF, APPEARCSF, RBCCOUNTCSF, WBCCSF, POLYSCSF, LYMPHSCSF, EOSCSF, PROTEINCSF, GLUCCSF, JCVIRUS, CSFOLI, IGGCSF, LABACHR, ACETBL   Inflammation (CRP: Acute  ESR: Chronic) Lab Results  Component Value Date   CRP 7.7 09/04/2021   ESRSEDRATE 75 (H) 09/04/2021     Rheumatology Lab Results  Component Value Date   ANA NEGATIVE 04/27/2018     Coagulation Lab Results  Component Value Date   INR 1.1 (H) 10/09/2021   LABPROT 11.8 10/09/2021   APTT 41.6 (H) 04/18/2018   PLT 232.0 12/27/2023   LABHEMA Note: 12/11/2019     Cardiovascular Lab Results  Component Value Date   HGB 15.0 12/27/2023   HCT 44.3 12/27/2023     Screening Lab  Results  Component Value Date   SARSCOV2NAA NEGATIVE 07/25/2019   HCVAB NEGATIVE 07/30/2011   HIV NON REACTIVE 07/26/2019     Cancer No results found for: CEA, CA125, LABCA2   Allergens No results found for: ALMOND, APPLE, ASPARAGUS, AVOCADO, BANANA, BARLEY, BASIL, BAYLEAF, GREENBEAN, LIMABEAN, WHITEBEAN, BEEFIGE, REDBEET, BLUEBERRY, BROCCOLI, CABBAGE, MELON, CARROT, CASEIN, CASHEWNUT, CAULIFLOWER, CELERY     Note: Lab results reviewed.  PFSH  Drug: Christine Barton  reports no history of drug use. Alcohol:  reports that she does not currently use alcohol. Tobacco:  reports that she has been smoking cigarettes. She started smoking about 2 years ago. She has a 59.3 pack-year smoking history. She has been exposed to tobacco smoke. She has never used smokeless tobacco. Medical:  has a past medical history of Alcohol abuse, unspecified, Breast cancer (HCC) (1998), Cataract, Cervical  spondylosis (2006), Degeneration of cervical intervertebral disc (2006), Diabetes mellitus without complication (HCC), Diverticulosis of colon (without mention of hemorrhage), Hyperpotassemia, Lung cancer (HCC) (03/2021), Microscopic hematuria, Mononeuritis of unspecified site, Nonspecific abnormal results of liver function study, Other abnormal glucose, Other and unspecified hyperlipidemia, Other chronic nonalcoholic liver disease, Personal history of chemotherapy, Personal history of malignant neoplasm of breast, Personal history of radiation therapy, Pneumothorax, acute, Tobacco use disorder, and Unspecified vitamin D  deficiency. Family: family history includes Colon cancer in her maternal uncle; Heart attack in her father; Heart failure in her father; Stroke in her mother.  Past Surgical History:  Procedure Laterality Date   BIOPSY  07/27/2019   Procedure: BIOPSY;  Surgeon: Leigh Elspeth SQUIBB, MD;  Location: [facility];  Service: Gastroenterology;;   BREAST BIOPSY  9/03   Right   BREAST LUMPECTOMY Right 1998   CHEST TUBE INSERTION  11/02/2014   COLONOSCOPY     COLONOSCOPY WITH PROPOFOL  N/A 07/27/2019   Procedure: COLONOSCOPY WITH PROPOFOL ;  Surgeon: Leigh Elspeth SQUIBB, MD;  Location: [facility];  Service: Gastroenterology;  Laterality: N/A;   COLONOSCOPY WITH PROPOFOL  N/A 07/29/2022   Procedure: COLONOSCOPY WITH PROPOFOL ;  Surgeon: Shila Gustav GAILS, MD;  Location: [facility];  Service: Gastroenterology;  Laterality: N/A;   ESOPHAGOGASTRODUODENOSCOPY (EGD) WITH PROPOFOL  N/A 10/30/2018   Procedure: ESOPHAGOGASTRODUODENOSCOPY (EGD) WITH PROPOFOL ;  Surgeon: Shila Gustav GAILS, MD;  Location: [facility];  Service: Endoscopy;  Laterality: N/A;   ESOPHAGOGASTRODUODENOSCOPY (EGD) WITH PROPOFOL  N/A 03/12/2019   Procedure: ESOPHAGOGASTRODUODENOSCOPY (EGD) WITH PROPOFOL ;  Surgeon: Shila Gustav GAILS, MD;  Location: [facility];  Service: Endoscopy;  Laterality: N/A;    ESOPHAGOGASTRODUODENOSCOPY (EGD) WITH PROPOFOL  N/A 07/27/2019   Procedure: ESOPHAGOGASTRODUODENOSCOPY (EGD) WITH PROPOFOL ;  Surgeon: Leigh Elspeth SQUIBB, MD;  Location: [facility];  Service: Gastroenterology;  Laterality: N/A;   EYE SURGERY  02/2017   cataract extraction with lens implant-left   HEMOSTASIS CLIP PLACEMENT  07/29/2022   Procedure: HEMOSTASIS CLIP PLACEMENT;  Surgeon: Shila Gustav GAILS, MD;  Location: [facility];  Service: Gastroenterology;;   HOT HEMOSTASIS N/A 07/27/2019   Procedure: HOT HEMOSTASIS (ARGON PLASMA COAGULATION/BICAP);  Surgeon: Leigh Elspeth SQUIBB, MD;  Location: [facility];  Service: Gastroenterology;  Laterality: N/A;   MOUTH SURGERY     POLYPECTOMY  07/27/2019   Procedure: POLYPECTOMY;  Surgeon: Leigh Elspeth SQUIBB, MD;  Location: [facility];  Service: Gastroenterology;;   POLYPECTOMY  07/29/2022   Procedure: POLYPECTOMY;  Surgeon: Shila Gustav GAILS, MD;  Location: [facility];  Service: Gastroenterology;;   TUBAL LIGATION     Active Ambulatory Problems    Diagnosis Date Noted   Vitamin D  deficiency 08/04/2009  Hyperlipidemia associated with type 2 diabetes mellitus (HCC) 06/14/2008   History of alcohol abuse 12/06/2006   Neuropathy of both feet 12/06/2006   DIVERTICULOSIS, COLON 12/06/2006   Fatty liver 12/06/2006   Degeneration of cervical intervertebral disc 03/30/2007   Personal history of malignant neoplasm of breast 12/06/2006   POSTMENOPAUSAL STATUS 08/04/2009   Routine general medical examination at a health care facility 07/21/2011   Left shoulder pain 08/20/2011   Encounter for routine gynecological examination 09/12/2013   Colon cancer screening 11/01/2014   Estrogen deficiency 10/26/2016   Smoker 11/07/2017   Cirrhosis of liver (HCC) 04/24/2018   Gallstones 04/24/2018   Heme positive stool 04/24/2018   Poor balance 05/30/2018   Anemia 10/23/2018   Esophageal varices without bleeding (HCC) 10/27/2018   Hyponatremia  11/07/2018   Venous insufficiency 11/15/2018   Encounter for screening for lung cancer 01/04/2019   Aortic atherosclerosis 01/22/2019   Coronary atherosclerosis 01/22/2019   HSV infection 02/23/2019   Malnutrition 07/25/2019   NASH (nonalcoholic steatohepatitis)    AVM (arteriovenous malformation) of colon    Benign neoplasm of colon    Iron  deficiency anemia due to chronic blood loss 11/06/2020   Iron  deficiency anemia 01/01/2021   Lung cancer (HCC) 02/25/2021   Left-sided chest wall pain 06/04/2021   Marasmus 07/26/2018   Agatston CAC score, >400 07/02/2021   Pulmonary emphysema (HCC) 09/03/2021   Wheezing 09/03/2021   Osteopenia 06/17/2022   Current use of proton pump inhibitor 12/14/2022   Food insecurity 01/25/2023   Depression with anxiety 07/22/2023   Microalbuminuria 07/22/2023   Left lateral abdominal pain 12/14/2023   Left flank pain 12/14/2023   Multiple thyroid  nodules 01/01/2024   Encounter for screening mammogram for breast cancer 01/03/2024   Diabetes mellitus treated with oral medication (HCC) 01/03/2024   Mid back pain on left side 01/24/2024   Chronic pain syndrome 02/29/2024   Pharmacologic therapy 02/29/2024   Disorder of skeletal system 02/29/2024   Problems influencing health status 02/29/2024   Abnormal MRI, thoracic spine (02/10/2024) 02/29/2024   DDD (degenerative disc disease), thoracic 02/29/2024   Thoracic radiculitis (T5) (Left) 02/29/2024   Chronic thoracic back pain (Left) 02/29/2024   Resolved Ambulatory Problems    Diagnosis Date Noted   HYPERKALEMIA 08/04/2009   Former smoker 12/06/2006   LIVER FUNCTION TESTS, ABNORMAL 12/06/2006   ADVERSE DRUG REACTION 06/14/2008   Microscopic hematuria 06/05/2010   Carbuncle and furuncle of buttock 06/05/2010   FLANK PAIN, LEFT 06/05/2010   Lower urinary tract infectious disease 02/24/2011   Other screening mammogram 07/21/2011   Nonspecific elevation of levels of transaminase or lactic acid  dehydrogenase (LDH) 07/30/2011   Macrocytosis 07/30/2011   Chest pain 08/20/2011   Heartburn 08/20/2011   Rapid heart beat 10/10/2012   Thrush 09/13/2013   Elevated liver enzymes 11/01/2014   Pneumothorax 11/02/2014   Welcome to Medicare preventive visit 10/26/2016   Screening examination for STD (sexually transmitted disease) 11/11/2016   Cat bite of ankle 11/07/2017   Screening mammogram, encounter for 11/07/2017   Hemoptysis 04/18/2018   Abdominal pain 04/18/2018   Ascites 04/24/2018   Falls 05/30/2018   Generalized weakness 05/30/2018   Tick bites 08/14/2018   Fatigue 10/23/2018   Melena    Elevated serum creatinine 11/07/2018   Low back pain 12/07/2018   Itching in the vaginal area 02/23/2019   Mass of soft tissue of face 03/06/2019   Symptomatic anemia 07/25/2019   Tobacco abuse 07/25/2019   Protein-calorie malnutrition, severe 07/27/2019  Prediabetes 02/18/2009   UTI (urinary tract infection) 08/04/2021   Dehydration 08/04/2021   Other chest pain 09/03/2021   Shortness of breath 09/03/2021   Acute cough 09/03/2021   Rib pain on right side 09/09/2021   Acute exacerbation of chronic obstructive pulmonary disease (COPD) (HCC) 09/28/2021   Cat bite of upper arm, left, initial encounter 12/23/2021   Skin tear of forearm without complication, right, initial encounter 01/31/2023   Laceration of left index finger 01/31/2023   Tick bite of left flank 10/11/2023   Past Medical History:  Diagnosis Date   Alcohol abuse, unspecified    Breast cancer (HCC) 1998   Cataract    Cervical spondylosis 2006   Diabetes mellitus without complication (HCC)    Diverticulosis of colon (without mention of hemorrhage)    Mononeuritis of unspecified site    Other abnormal glucose    Other and unspecified hyperlipidemia    Other chronic nonalcoholic liver disease    Personal history of chemotherapy    Personal history of radiation therapy    Pneumothorax, acute    Tobacco use  disorder    Unspecified vitamin D  deficiency    Constitutional Exam  General appearance: Well nourished, well developed, and well hydrated. In no apparent acute distress Vitals:   02/29/24 1305  BP: 132/79  Pulse: 79  Resp: 16  Temp: (!) 97.3 F (36.3 C)  SpO2: 98%  Weight: 112 lb (50.8 kg)  Height: 5' 4.5 (1.638 m)   BMI Assessment: Estimated body mass index is 18.93 kg/m as calculated from the following:   Height as of this encounter: 5' 4.5 (1.638 m).   Weight as of this encounter: 112 lb (50.8 kg).  BMI interpretation table: BMI level Category Range association with higher incidence of chronic pain  <18 kg/m2 Underweight   18.5-24.9 kg/m2 Ideal body weight   25-29.9 kg/m2 Overweight Increased incidence by 20%  30-34.9 kg/m2 Obese (Class I) Increased incidence by 68%  35-39.9 kg/m2 Severe obesity (Class II) Increased incidence by 136%  >40 kg/m2 Extreme obesity (Class III) Increased incidence by 254%   Patient's current BMI Ideal Body weight  Body mass index is 18.93 kg/m. Ideal body weight: 55.8 kg (123 lb 2 oz)   BMI Readings from Last 4 Encounters:  02/29/24 18.93 kg/m  01/17/24 18.71 kg/m  01/10/24 18.39 kg/m  01/05/24 18.74 kg/m   Wt Readings from Last 4 Encounters:  02/29/24 112 lb (50.8 kg)  01/17/24 109 lb (49.4 kg)  01/10/24 107 lb 2 oz (48.6 kg)  01/05/24 109 lb 3.2 oz (49.5 kg)    Psych/Mental status: Alert, oriented x 3 (person, place, & time)       Eyes: PERLA Respiratory: No evidence of acute respiratory distress  Assessment  Primary Diagnosis & Pertinent Problem List: The primary encounter diagnosis was Thoracic radiculitis (T5) (Left). Diagnoses of Chronic thoracic back pain (Left), DDD (degenerative disc disease), thoracic, Abnormal MRI, thoracic spine (02/10/2024), Chronic pain syndrome, Pharmacologic therapy, Disorder of skeletal system, and Problems influencing health status were also pertinent to this visit.  Visit Diagnosis (New  problems to examiner): 1. Thoracic radiculitis (T5) (Left)   2. Chronic thoracic back pain (Left)   3. DDD (degenerative disc disease), thoracic   4. Abnormal MRI, thoracic spine (02/10/2024)   5. Chronic pain syndrome   6. Pharmacologic therapy   7. Disorder of skeletal system   8. Problems influencing health status    Plan of Care (Initial workup plan)  Note: Christine Barton  was reminded that as per protocol, today's visit has been an evaluation only. We have not taken over the patient's controlled substance management.  Problem-specific plan: Assessment and Plan    Left thoracic radiculitis with chronic pain syndrome   Chronic left-sided thoracic radiculitis presents with intermittent pain under the breast wrapping around the back, partially relieved by gabapentin and acetaminophen . There is no rash or history of antiviral treatment. MRI indicates degenerative changes without nerve compression. The differential diagnosis includes nerve swelling possibly due to coughing spells or osteoporosis-related nerve irritation. A 9-day course of oral prednisone  with a tapering dose is prescribed to reduce swelling and alleviate pain. If ineffective, a nerve block may be considered. Lab tests are ordered to check for swelling, and a follow-up appointment is scheduled in two weeks to assess treatment response.  Degenerative disc disease of the thoracic spine   MRI reveals degenerative changes in the thoracic spine without nerve compression, which may contribute to radiculitis symptoms.  Type 2 diabetes mellitus, controlled   Type 2 diabetes mellitus is controlled with metformin , with no history of diabetic ketoacidosis or coma. Steroid treatment may temporarily increase blood glucose levels, so monitoring is advised during this period.  Tobacco use   Continued tobacco use may contribute to respiratory symptoms and potential osteoporosis, exacerbating radiculitis symptoms. The risks of continued tobacco  use and its potential impact on health were discussed.       Lab Orders         Compliance Drug Analysis, Ur         Magnesium          Sedimentation rate         C-reactive protein     Imaging Orders  No imaging studies ordered today   Referral Orders  No referral(s) requested today   Procedure Orders    No procedure(s) ordered today   Pharmacotherapy (current): Medications ordered:  Meds ordered this encounter  Medications   predniSONE  (DELTASONE ) 20 MG tablet    Sig: Take 3 tablets (60 mg total) by mouth daily with breakfast for 3 days, THEN 2 tablets (40 mg total) daily with breakfast for 3 days, THEN 1 tablet (20 mg total) daily with breakfast for 3 days.    Dispense:  18 tablet    Refill:  0   Medications administered during this visit: Christine Barton had no medications administered during this visit.   Analgesic Pharmacotherapy:  Opioid Analgesics: For patients currently taking or requesting to take opioid analgesics, in accordance with [place]  Medical Board Guidelines, we will assess their risks and indications for the use of these substances. After completing our evaluation, we may offer recommendations, but we no longer take patients for medication management. The prescribing physician will ultimately decide, based on his/her training and level of comfort whether to adopt any of the recommendations, including whether or not to prescribe such medicines.  Membrane stabilizer: To be determined at a later time  Muscle relaxant: To be determined at a later time  NSAID: To be determined at a later time  Other analgesic(s): To be determined at a later time   Interventional management options: Christine Barton was informed that there is no guarantee that she would be a candidate for interventional therapies. The decision will be based on the results of diagnostic studies, as well as Christine Barton's risk profile.  Procedure(s) under consideration:  Pending results of ordered  studies     Interventional Therapies  Risk Factors  Considerations  Medical Comorbidities:     Planned  Pending:      Under consideration:   Pending   Completed: (Analgesic benefit)1  None at this time   Therapeutic  Palliative (PRN) options:   None established   Completed by other providers:   None reported  1(Analgesic benefit): Expressed in percentage (%). (Local anesthetic[LA] +/- sedation  L.A.Local Anesthetic  Steroid benefit  Ongoing benefit)   Provider-requested follow-up: Return in about 2 weeks (around 03/14/2024) for ( ), Eval-day (M,W), (Face2F), 2nd Visit, to review of ordered test(s).  Future Appointments  Date Time Provider Department Center  03/06/2024  2:45 PM Tamea Dedra CROME, MD [facility] [id number]  03/07/2024  9:45 AM Valdemar Rogue, MD [facility] None  04/03/2024  2:20 PM [facility] ANNUAL WELLNESS VISIT 1 [facility] [address]  04/10/2024  2:00 PM Tower, Laine LABOR, MD [facility] [address]  04/19/2024  2:10 PM Shila Gustav GAILS, MD [facility]  08/21/2024  2:00 PM Mozingo, Angeline Mattocks, NP [facility] None  10/15/2024 12:15 PM [facility] None  10/22/2024  3:15 PM Sherrod Sherrod, MD [facility] None   I discussed the assessment and treatment plan with the patient. The patient was provided an opportunity to ask questions and all were answered. The patient agreed with the plan and demonstrated an understanding of the instructions.  Patient advised to call back or seek an in-person evaluation if the symptoms or condition worsens.  Duration of encounter: 129 minutes.  Total time on encounter, as per AMA guidelines included both the face-to-face and non-face-to-face time personally spent by the physician and/or other qualified health care professional(s) on the day of the encounter (includes time in activities that require the physician or other qualified health care professional and does not include time in activities normally performed by  clinical staff). Physician's time may include the following activities when performed: Preparing to see the patient (e.g., pre-charting review of records, searching for previously ordered imaging, lab work, and nerve conduction tests) Review of prior analgesic pharmacotherapies. Reviewing PMP Interpreting ordered tests (e.g., lab work, imaging, nerve conduction tests) Performing post-procedure evaluations, including interpretation of diagnostic procedures Obtaining and/or reviewing separately obtained history Performing a medically appropriate examination and/or evaluation Counseling and educating the patient/family/caregiver Ordering medications, tests, or procedures Referring and communicating with other health care professionals (when not separately reported) Documenting clinical information in the electronic or other health record Independently interpreting results (not separately reported) and communicating results to the patient/ family/caregiver Care coordination (not separately reported)  Note by: Eric LABOR Como, MD (TTS and AI technology used. I apologize for any typographical errors that were not detected and corrected.) Date: 02/29/2024; Time: 2:07 PM

## 2024-02-29 NOTE — Patient Instructions (Signed)

## 2024-03-01 LAB — SEDIMENTATION RATE: Sed Rate: 32 mm/h (ref 0–40)

## 2024-03-01 LAB — C-REACTIVE PROTEIN: CRP: 8 mg/L (ref 0–10)

## 2024-03-01 LAB — MAGNESIUM: Magnesium: 1.7 mg/dL (ref 1.6–2.3)

## 2024-03-01 NOTE — Telephone Encounter (Signed)
 Called and no answer and unable to leave message will resolve message and address if pt calls back

## 2024-03-02 ENCOUNTER — Other Ambulatory Visit: Payer: Self-pay | Admitting: Primary Care

## 2024-03-02 DIAGNOSIS — S51811A Laceration without foreign body of right forearm, initial encounter: Secondary | ICD-10-CM

## 2024-03-02 NOTE — Telephone Encounter (Signed)
 Tramadol  isn't on med list but last prescribed was on 01/03/24 #15 tabs/ 0 refills   Nystatin  last filled on 01/20/24 #120 mL/ 0 refills   Last OV here was on 01/10/24

## 2024-03-04 LAB — COMPLIANCE DRUG ANALYSIS, UR

## 2024-03-06 ENCOUNTER — Other Ambulatory Visit: Payer: Self-pay | Admitting: *Deleted

## 2024-03-06 ENCOUNTER — Ambulatory Visit: Admitting: Pulmonary Disease

## 2024-03-06 MED ORDER — METFORMIN HCL ER 500 MG PO TB24
500.0000 mg | ORAL_TABLET | Freq: Every day | ORAL | 0 refills | Status: AC
Start: 2024-03-06 — End: ?

## 2024-03-07 ENCOUNTER — Ambulatory Visit (INDEPENDENT_AMBULATORY_CARE_PROVIDER_SITE_OTHER): Admitting: Ophthalmology

## 2024-03-07 DIAGNOSIS — E119 Type 2 diabetes mellitus without complications: Secondary | ICD-10-CM

## 2024-03-07 DIAGNOSIS — Z7984 Long term (current) use of oral hypoglycemic drugs: Secondary | ICD-10-CM

## 2024-03-07 DIAGNOSIS — H353211 Exudative age-related macular degeneration, right eye, with active choroidal neovascularization: Secondary | ICD-10-CM

## 2024-03-07 DIAGNOSIS — H25811 Combined forms of age-related cataract, right eye: Secondary | ICD-10-CM | POA: Diagnosis not present

## 2024-03-07 DIAGNOSIS — Z961 Presence of intraocular lens: Secondary | ICD-10-CM

## 2024-03-07 DIAGNOSIS — H353123 Nonexudative age-related macular degeneration, left eye, advanced atrophic without subfoveal involvement: Secondary | ICD-10-CM

## 2024-03-08 ENCOUNTER — Other Ambulatory Visit: Payer: Self-pay | Admitting: Pain Medicine

## 2024-03-08 ENCOUNTER — Encounter (INDEPENDENT_AMBULATORY_CARE_PROVIDER_SITE_OTHER): Payer: Self-pay | Admitting: Ophthalmology

## 2024-03-08 ENCOUNTER — Other Ambulatory Visit: Payer: Self-pay | Admitting: Family Medicine

## 2024-03-08 DIAGNOSIS — M5134 Other intervertebral disc degeneration, thoracic region: Secondary | ICD-10-CM

## 2024-03-08 DIAGNOSIS — M5414 Radiculopathy, thoracic region: Secondary | ICD-10-CM

## 2024-03-08 DIAGNOSIS — G8929 Other chronic pain: Secondary | ICD-10-CM

## 2024-03-08 MED ORDER — BEVACIZUMAB CHEMO INJECTION 1.25MG/0.05ML SYRINGE FOR KALEIDOSCOPE
1.2500 mg | INTRAVITREAL | Status: AC | PRN
  Administered 2024-03-08: 1.25 mg via INTRAVITREAL

## 2024-03-12 ENCOUNTER — Encounter: Payer: Self-pay | Admitting: Physician Assistant

## 2024-03-12 NOTE — Telephone Encounter (Signed)
 Med not on med list (Rx expired) but last filled on 03/02/24 #15 tab/ 0 refills   Next OV is on 04/10/24

## 2024-03-19 ENCOUNTER — Ambulatory Visit: Admitting: Pain Medicine

## 2024-03-21 ENCOUNTER — Telehealth: Payer: Self-pay | Admitting: Family Medicine

## 2024-03-21 NOTE — Telephone Encounter (Signed)
 Form in your inbox

## 2024-03-21 NOTE — Telephone Encounter (Signed)
Done and in in box  

## 2024-03-21 NOTE — Telephone Encounter (Signed)
 Patient dropped off paperwork to be signed anf faxed over to number provided.

## 2024-03-22 NOTE — Telephone Encounter (Signed)
Form faxed and copy sent to scanning.

## 2024-03-23 HISTORY — PX: CATARACT EXTRACTION: SUR2

## 2024-04-02 ENCOUNTER — Encounter: Payer: Self-pay | Admitting: Physician Assistant

## 2024-04-03 ENCOUNTER — Telehealth: Payer: Self-pay

## 2024-04-03 NOTE — Telephone Encounter (Signed)
 Copied from CRM [id number]. Topic: General - Other >> Apr 03, 2024 11:00 AM Deaijah H wrote: Reason for CRM: Patient called in wanting Shapel to give her a call.

## 2024-04-03 NOTE — Telephone Encounter (Signed)
 Left VM requesting pt to call the office back

## 2024-04-06 ENCOUNTER — Other Ambulatory Visit: Payer: Self-pay | Admitting: Gastroenterology

## 2024-04-06 NOTE — Telephone Encounter (Signed)
 Left VM requesting pt to call the office back   Not sure why pt wanted to speak with me but if she calls back will address then

## 2024-04-10 ENCOUNTER — Ambulatory Visit: Payer: Self-pay | Admitting: Family Medicine

## 2024-04-10 ENCOUNTER — Encounter: Payer: Self-pay | Admitting: Family Medicine

## 2024-04-10 VITALS — BP 128/68 | HR 66 | Temp 97.6°F | Ht 64.5 in | Wt 108.1 lb

## 2024-04-10 DIAGNOSIS — Z23 Encounter for immunization: Secondary | ICD-10-CM

## 2024-04-10 DIAGNOSIS — E042 Nontoxic multinodular goiter: Secondary | ICD-10-CM

## 2024-04-10 DIAGNOSIS — E1169 Type 2 diabetes mellitus with other specified complication: Secondary | ICD-10-CM | POA: Diagnosis not present

## 2024-04-10 DIAGNOSIS — J439 Emphysema, unspecified: Secondary | ICD-10-CM

## 2024-04-10 DIAGNOSIS — K760 Fatty (change of) liver, not elsewhere classified: Secondary | ICD-10-CM | POA: Diagnosis not present

## 2024-04-10 DIAGNOSIS — M5414 Radiculopathy, thoracic region: Secondary | ICD-10-CM

## 2024-04-10 DIAGNOSIS — F17200 Nicotine dependence, unspecified, uncomplicated: Secondary | ICD-10-CM | POA: Diagnosis not present

## 2024-04-10 DIAGNOSIS — E785 Hyperlipidemia, unspecified: Secondary | ICD-10-CM

## 2024-04-10 DIAGNOSIS — E119 Type 2 diabetes mellitus without complications: Secondary | ICD-10-CM

## 2024-04-10 DIAGNOSIS — Z7984 Long term (current) use of oral hypoglycemic drugs: Secondary | ICD-10-CM | POA: Diagnosis not present

## 2024-04-10 LAB — POCT GLYCOSYLATED HEMOGLOBIN (HGB A1C): Hemoglobin A1C: 6.3 % — AB (ref 4.0–5.6)

## 2024-04-10 NOTE — Assessment & Plan Note (Addendum)
 Pt continues to decline further eval (us ) of these No clinical changes  Lab Results  Component Value Date   TSH 2.55 12/27/2023

## 2024-04-10 NOTE — Patient Instructions (Addendum)
 Try to eat regularly  Make sure you get protein with every meal and snack   The following are examples of protein in diet  Meat  (lean)  Fish  Eggs  Dairy products  Soy products  Oat milk  Almond milk Nuts and nut butters  Dried beans   Continue the metformin  for blood sugar  Your glucose is in good continue    Stay as active as you can safely be  Keep thinking about quitting smoking  Follow up with pulmonary as planned

## 2024-04-10 NOTE — Assessment & Plan Note (Signed)
 1ppd Copd and lung cancer Not ready to quit

## 2024-04-10 NOTE — Assessment & Plan Note (Signed)
 Stable LFTs in sept

## 2024-04-10 NOTE — Assessment & Plan Note (Signed)
 Now going to pain management  Is improved-gradually   Had course of prednisone  Nothing since (did discuss nerve block if needed)  No tramadol  recently Not taking gabapentin  currently

## 2024-04-10 NOTE — Progress Notes (Signed)
 "  Subjective:    Patient ID: Christine Barton, female    DOB: [birth year], 73 y.o.   MRN: [id number]  HPI  Wt Readings from Last 3 Encounters:  04/10/24 108 lb 2 oz (49 kg)  02/29/24 112 lb (50.8 kg)  01/17/24 109 lb (49.4 kg)   18.27 kg/m  Vitals:   04/10/24 1415 04/10/24 1426  BP: 128/68   Pulse: 65 66  Temp: 97.6 F (36.4 C)   SpO2: 92% 98%     Pt presents for follow up of chronic medical problems  DM2 Copd NASH with cirrhosis  Thyroid  nodules (declines further evaluation at this time)  Hyperlipidemia Flu shot   Going to pain medicine for thoracic radiculitis, DDD- chronic pain syndrome  A 9 day course of prednisone  with taper was prescription Considered a nerve block   Still there but not as bad as it was  More good days now   Smoking status   DM2 Lab Results  Component Value Date   HGBA1C 6.3 (A) 04/10/2024   HGBA1C 6.7 (H) 12/14/2023   HGBA1C 5.7 (H) 07/22/2023   Metformin  xr 500 mg daily  No side effects   Appetite has picked up some since pain is improved  Healthy drinks  Tries to keep up with protein   Is active in the house  A lot of cleaning   Not a lot of sweets     Lab Results  Component Value Date   MICROALBUR 9.6 (H) 07/19/2023   MICROALBUR 0.3 11/02/2019     Hyperlipidemia Lab Results  Component Value Date   CHOL 196 12/27/2023   HDL 41.80 12/27/2023   LDLCALC 104 (H) 12/27/2023   LDLDIRECT 83.0 10/31/2017   TRIG 254.0 (H) 12/27/2023   CHOLHDL 5 12/27/2023   No statin due to cirrhosis   Lab Results  Component Value Date   ALT 9 12/27/2023   AST 20 12/27/2023   ALKPHOS 76 12/27/2023   BILITOT 0.5 12/27/2023   Followed by GI    No new medicines   Breathing is stable she thinks   Lab Results  Component Value Date   NA 135 12/27/2023   K 4.8 12/27/2023   CO2 32 12/27/2023   GLUCOSE 117 (H) 12/27/2023   BUN 14 12/27/2023   CREATININE 0.66 12/27/2023   CALCIUM  10.4 12/27/2023   GFR 87.26 12/27/2023   EGFR 93  07/22/2023   GFRNONAA >60 12/07/2023      Patient Active Problem List   Diagnosis Date Noted   Chronic pain syndrome 02/29/2024   Pharmacologic therapy 02/29/2024   Disorder of skeletal system 02/29/2024   Problems influencing health status 02/29/2024   Abnormal MRI, thoracic spine (02/10/2024) 02/29/2024   DDD (degenerative disc disease), thoracic 02/29/2024   Thoracic radiculitis (T5) (Left) 02/29/2024   Chronic thoracic back pain (Left) 02/29/2024   Mid back pain on left side 01/24/2024   Encounter for screening mammogram for breast cancer 01/03/2024   Diabetes mellitus treated with oral medication (HCC) 01/03/2024   Multiple thyroid  nodules 01/01/2024   Left lateral abdominal pain 12/14/2023   Left flank pain 12/14/2023   Depression with anxiety 07/22/2023   Microalbuminuria 07/22/2023   Food insecurity 01/25/2023   Current use of proton pump inhibitor 12/14/2022   Osteopenia 06/17/2022   Pulmonary emphysema (HCC) 09/03/2021   Wheezing 09/03/2021   Agatston CAC score, >400 07/02/2021   Left-sided chest wall pain 06/04/2021   Lung cancer (HCC) 02/25/2021   Iron  deficiency anemia 01/01/2021  Iron  deficiency anemia due to chronic blood loss 11/06/2020   AVM (arteriovenous malformation) of colon    Benign neoplasm of colon    NASH (nonalcoholic steatohepatitis)    Malnutrition 07/25/2019   HSV infection 02/23/2019   Aortic atherosclerosis 01/22/2019   Coronary atherosclerosis 01/22/2019   Encounter for screening for lung cancer 01/04/2019   Venous insufficiency 11/15/2018   Hyponatremia 11/07/2018   Esophageal varices without bleeding (HCC) 10/27/2018   Anemia 10/23/2018   Marasmus 07/26/2018   Poor balance 05/30/2018   Cirrhosis of liver (HCC) 04/24/2018   Gallstones 04/24/2018   Heme positive stool 04/24/2018   Smoker 11/07/2017   Estrogen deficiency 10/26/2016   Colon cancer screening 11/01/2014   Encounter for routine gynecological examination 09/12/2013    Left shoulder pain 08/20/2011   Routine general medical examination at a health care facility 07/21/2011   Vitamin D  deficiency 08/04/2009   POSTMENOPAUSAL STATUS 08/04/2009   Hyperlipidemia associated with type 2 diabetes mellitus (HCC) 06/14/2008   Degeneration of cervical intervertebral disc 03/30/2007   History of alcohol abuse 12/06/2006   Neuropathy of both feet 12/06/2006   DIVERTICULOSIS, COLON 12/06/2006   Fatty liver 12/06/2006   Personal history of malignant neoplasm of breast 12/06/2006   Past Medical History:  Diagnosis Date   Alcohol abuse, unspecified    Breast cancer (HCC) 1998   Right   Cataract    left eye   Cervical spondylosis 2006   MRI   Degeneration of cervical intervertebral disc 2006   MRI   Diabetes mellitus without complication (HCC)    Diverticulosis of colon (without mention of hemorrhage)    Hyperpotassemia    Lung cancer (HCC) 03/2021   started radiation in January 2023, right side   Microscopic hematuria    Mononeuritis of unspecified site    Nonspecific abnormal results of liver function study    Other abnormal glucose    Other and unspecified hyperlipidemia    no per pt   Other chronic nonalcoholic liver disease    Personal history of chemotherapy    Personal history of malignant neoplasm of breast    Personal history of radiation therapy    Pneumothorax, acute    right, spontaneous   Tobacco use disorder    Unspecified vitamin D  deficiency    Past Surgical History:  Procedure Laterality Date   BIOPSY  07/27/2019   Procedure: BIOPSY;  Surgeon: Leigh Elspeth SQUIBB, MD;  Location: [facility];  Service: Gastroenterology;;   BREAST BIOPSY  9/03   Right   BREAST LUMPECTOMY Right 1998   CHEST TUBE INSERTION  11/02/2014   COLONOSCOPY     COLONOSCOPY WITH PROPOFOL  N/A 07/27/2019   Procedure: COLONOSCOPY WITH PROPOFOL ;  Surgeon: Leigh Elspeth SQUIBB, MD;  Location: [facility];  Service: Gastroenterology;  Laterality: N/A;   COLONOSCOPY  WITH PROPOFOL  N/A 07/29/2022   Procedure: COLONOSCOPY WITH PROPOFOL ;  Surgeon: Shila Gustav GAILS, MD;  Location: [facility];  Service: Gastroenterology;  Laterality: N/A;   ESOPHAGOGASTRODUODENOSCOPY (EGD) WITH PROPOFOL  N/A 10/30/2018   Procedure: ESOPHAGOGASTRODUODENOSCOPY (EGD) WITH PROPOFOL ;  Surgeon: Shila Gustav GAILS, MD;  Location: [facility];  Service: Endoscopy;  Laterality: N/A;   ESOPHAGOGASTRODUODENOSCOPY (EGD) WITH PROPOFOL  N/A 03/12/2019   Procedure: ESOPHAGOGASTRODUODENOSCOPY (EGD) WITH PROPOFOL ;  Surgeon: Shila Gustav GAILS, MD;  Location: [facility];  Service: Endoscopy;  Laterality: N/A;   ESOPHAGOGASTRODUODENOSCOPY (EGD) WITH PROPOFOL  N/A 07/27/2019   Procedure: ESOPHAGOGASTRODUODENOSCOPY (EGD) WITH PROPOFOL ;  Surgeon: Leigh Elspeth SQUIBB, MD;  Location: [facility];  Service: Gastroenterology;  Laterality: N/A;   EYE SURGERY  02/2017   cataract extraction with lens implant-left   HEMOSTASIS CLIP PLACEMENT  07/29/2022   Procedure: HEMOSTASIS CLIP PLACEMENT;  Surgeon: Shila Gustav GAILS, MD;  Location: [facility];  Service: Gastroenterology;;   HOT HEMOSTASIS N/A 07/27/2019   Procedure: HOT HEMOSTASIS (ARGON PLASMA COAGULATION/BICAP);  Surgeon: Leigh Elspeth SQUIBB, MD;  Location: [facility];  Service: Gastroenterology;  Laterality: N/A;   MOUTH SURGERY     POLYPECTOMY  07/27/2019   Procedure: POLYPECTOMY;  Surgeon: Leigh Elspeth SQUIBB, MD;  Location: [facility];  Service: Gastroenterology;;   POLYPECTOMY  07/29/2022   Procedure: POLYPECTOMY;  Surgeon: Shila Gustav GAILS, MD;  Location: [facility];  Service: Gastroenterology;;   TUBAL LIGATION     Social History[1] Family History  Problem Relation Age of Onset   Heart failure Father    Heart attack Father    Colon cancer Maternal Uncle    Stroke Mother    Esophageal cancer Neg Hx    Rectal cancer Neg Hx    Stomach cancer Neg Hx    Pancreatic cancer Neg Hx    Allergies[2] Medications Ordered Prior to  Encounter[3]  Review of Systems  Constitutional:  Negative for activity change, appetite change, fatigue, fever and unexpected weight change.       Low appetite baseline   HENT:  Negative for congestion, ear pain, rhinorrhea, sinus pressure and sore throat.   Eyes:  Negative for pain, redness and visual disturbance.  Respiratory:  Positive for cough and shortness of breath. Negative for wheezing.   Cardiovascular:  Negative for chest pain and palpitations.  Gastrointestinal:  Negative for abdominal pain, blood in stool, constipation and diarrhea.  Endocrine: Negative for polydipsia and polyuria.  Genitourinary:  Negative for dysuria, frequency and urgency.  Musculoskeletal:  Negative for arthralgias, back pain and myalgias.       Thoracic pain is improved but still there   Skin:  Negative for pallor and rash.  Allergic/Immunologic: Negative for environmental allergies.  Neurological:  Negative for dizziness, syncope and headaches.  Hematological:  Negative for adenopathy. Does not bruise/bleed easily.  Psychiatric/Behavioral:  Negative for decreased concentration and dysphoric mood. The patient is not nervous/anxious.        Objective:   Physical Exam Constitutional:      General: She is not in acute distress.    Appearance: Normal appearance. She is well-developed and normal weight. She is not ill-appearing or diaphoretic.  HENT:     Head: Normocephalic and atraumatic.  Eyes:     Conjunctiva/sclera: Conjunctivae normal.     Pupils: Pupils are equal, round, and reactive to light.  Neck:     Thyroid : No thyromegaly.     Vascular: No carotid bruit or JVD.  Cardiovascular:     Rate and Rhythm: Normal rate and regular rhythm.     Heart sounds: Normal heart sounds.     No gallop.  Pulmonary:     Effort: Pulmonary effort is normal. No respiratory distress.     Breath sounds: Normal breath sounds. No wheezing or rales.     Comments: Diffusely distant bs  Scattered exp wheeze   Abdominal:     General: There is no distension or abdominal bruit.     Palpations: Abdomen is soft.  Musculoskeletal:     Cervical back: Normal range of motion and neck supple.     Right lower leg: No edema.     Left lower leg: No edema.  Lymphadenopathy:  Cervical: No cervical adenopathy.  Skin:    General: Skin is warm and dry.     Coloration: Skin is not pale.     Findings: No rash.  Neurological:     Mental Status: She is alert.     Coordination: Coordination normal.     Deep Tendon Reflexes: Reflexes are normal and symmetric. Reflexes normal.  Psychiatric:        Mood and Affect: Mood normal.           Assessment & Plan:   Problem List Items Addressed This Visit       Respiratory   Pulmonary emphysema (HCC)   Continues pulmonary care  Continues to smoke 1ppd and not ready to quit  Scant wheezing on exam  Pulse ox 92%   Some smoker's cough today  Updated flu vaccine  Continues spiriva   Ventolin  prn         Digestive   Fatty liver   Stable LFTs in sept         Endocrine   Multiple thyroid  nodules   Pt continues to decline further eval (us ) of these No clinical changes  Lab Results  Component Value Date   TSH 2.55 12/27/2023         Hyperlipidemia associated with type 2 diabetes mellitus (HCC)   Disc goals for lipids and reasons to control them Rev last labs with pt Rev low sat fat diet in detail LDL is 104 No statin in light of cirrhosis  Trig up possibly due to elevated glucose       Diabetes mellitus treated with oral medication (HCC) - Primary   Improved with metformin  xr 500 mg daily  Weight is down a bit-reviewed protein sources for this Lab Results  Component Value Date   HGBA1C 6.3 (A) 04/10/2024   HGBA1C 6.7 (H) 12/14/2023   HGBA1C 5.7 (H) 07/22/2023   Aware of microalbuminuria       Relevant Orders   POCT HgB A1C (Completed)     Nervous and Auditory   Thoracic radiculitis (T5) (Left) (Chronic)   Now going to  pain management  Is improved-gradually   Had course of prednisone  Nothing since (did discuss nerve block if needed)  No tramadol  recently Not taking gabapentin  currently         Other   Smoker   1ppd Copd and lung cancer Not ready to quit          [1]  Social History Tobacco Use   Smoking status: Every Day    Current packs/day: 1.00    Average packs/day: 1 pack/day for 59.4 years (59.4 ttl pk-yrs)    Types: Cigarettes    Start date: 09/25/2021    Passive exposure: Past   Smokeless tobacco: Never   Tobacco comments:    Started smoking at 73 years old.     1PPD 12/20/2022 khj  Vaping Use   Vaping status: Never Used  Substance Use Topics   Alcohol use: Not Currently   Drug use: No  [2]  Allergies Allergen Reactions   Anoro Ellipta  [Umeclidinium-Vilanterol] Cough   Bevespi  Aerosphere [Glycopyrrolate-Formoterol ] Cough    Prolonged coughing episode with first dose   Oxycodone  Nausea And Vomiting   Crestor  [Rosuvastatin ]     Abdominal pain     Glipizide  Other (See Comments)    Stomach pain   Losartan      Pt thinks it caused urinary retention     Spironolactone      Urinary retention    [  3]  Current Outpatient Medications on File Prior to Visit  Medication Sig Dispense Refill   acetaminophen  (TYLENOL ) 500 MG tablet Take 500 mg by mouth every 6 (six) hours as needed for moderate pain.     albuterol  (VENTOLIN  HFA) 108 (90 Base) MCG/ACT inhaler INHALE 2 PUFFS INTO THE LUNGS EVERY 4 HOURS AS NEEDED FOR WHEEZE OR FOR SHORTNESS OF BREATH 8.5 each 2   cholecalciferol (VITAMIN D3) 25 MCG (1000 UNIT) tablet Take 1,000 Units by mouth daily.     clorazepate  (TRANXENE ) 3.75 MG tablet Take 1 tablet (3.75 mg total) by mouth 3 (three) times daily as needed for anxiety. 90 tablet 2   cyclobenzaprine  (FLEXERIL ) 10 MG tablet TAKE 1/2 TABLET BY MOUTH AT BEDTIME AS NEEDED FOR MUSCLE SPASMS 15 tablet 3   desvenlafaxine  (PRISTIQ ) 100 MG 24 hr tablet Take 1 tablet (100 mg total) by mouth  daily. 30 tablet 5   lactulose  (CHRONULAC ) 10 GM/15ML solution TAKE 15 ML BY MOUTH 2 TIMES DAILY AS NEEDED FOR SEVERE CONSTIPATION. 2700 mL 1   metFORMIN  (GLUCOPHAGE -XR) 500 MG 24 hr tablet Take 1 tablet (500 mg total) by mouth daily with breakfast. 90 tablet 0   Multiple Vitamin (MULTIVITAMIN) tablet Take 1 tablet by mouth daily.     Multiple Vitamins-Minerals (PRESERVISION AREDS 2) CAPS Take 1 capsule by mouth 2 (two) times daily.     mupirocin  ointment (BACTROBAN ) 2 % APPLY 1 APPLICATION TOPICALLY 2 TIMES DAILY AS NEEDED. 15 g 3   nadolol  (CORGARD ) 20 MG tablet TAKE 1 TABLET BY MOUTH EVERY DAY 90 tablet 3   nystatin  (MYCOSTATIN ) 100000 UNIT/ML suspension TAKE 5 MLS (500,000 UNITS TOTAL) BY MOUTH 3 (THREE) TIMES DAILY. SWISH AND SWALLOW 120 mL 0   pantoprazole  (PROTONIX ) 40 MG tablet TAKE 1 TABLET BY MOUTH EVERY DAY 90 tablet 3   SYSTANE COMPLETE 0.6 % SOLN Apply 1 drop to eye 2 (two) times daily.     Tiotropium Bromide Monohydrate  (SPIRIVA  RESPIMAT) 2.5 MCG/ACT AERS INHALE 2 PUFFS BY MOUTH INTO THE LUNGS DAILY 4 g 11   ursodiol  (ACTIGALL ) 300 MG capsule TAKE 1 CAPSULE BY MOUTH EVERY DAY 90 capsule 3   XIFAXAN  550 MG TABS tablet TAKE 1 TABLET BY MOUTH 2 TIMES DAILY. 180 tablet 3   gabapentin  (NEURONTIN ) 100 MG capsule Take 3 pills by mouth in the am and 2 pills in the pm with caution of sedation (Patient not taking: Reported on 04/10/2024) 150 capsule 2   No current facility-administered medications on file prior to visit.   "

## 2024-04-10 NOTE — Assessment & Plan Note (Signed)
 Disc goals for lipids and reasons to control them Rev last labs with pt Rev low sat fat diet in detail LDL is 104 No statin in light of cirrhosis  Trig up possibly due to elevated glucose

## 2024-04-10 NOTE — Assessment & Plan Note (Signed)
 Improved with metformin  xr 500 mg daily  Weight is down a bit-reviewed protein sources for this Lab Results  Component Value Date   HGBA1C 6.3 (A) 04/10/2024   HGBA1C 6.7 (H) 12/14/2023   HGBA1C 5.7 (H) 07/22/2023   Aware of microalbuminuria

## 2024-04-10 NOTE — Assessment & Plan Note (Signed)
 Continues pulmonary care  Continues to smoke 1ppd and not ready to quit  Scant wheezing on exam  Pulse ox 92%   Some smoker's cough today  Updated flu vaccine  Continues spiriva   Ventolin  prn

## 2024-04-11 ENCOUNTER — Encounter: Payer: Self-pay | Admitting: Physician Assistant

## 2024-04-13 ENCOUNTER — Encounter: Payer: Self-pay | Admitting: Physician Assistant

## 2024-04-13 NOTE — Progress Notes (Signed)
 [facility] - Clinic Note  04/18/2024   CHIEF COMPLAINT Patient presents for Retina Follow Up  HISTORY OF PRESENT ILLNESS: Christine Barton is a 74 y.o. female who presents to the clinic today for:  HPI     Retina Follow Up   Patient presents with  Wet AMD.  In both eyes.  This started 6 weeks ago.  Duration of 6 weeks.  Since onset it is stable.  I, the attending physician,  performed the HPI with the patient and updated documentation appropriately.        Comments   6 week retina follow up ARMD OD and IVA OD pt is reporting vision is not as good out of right eye since cataract surgery having some double vision December 12 th she is using PF bid OD and moxi at bedtime she denies any flashes has some floaters her last A1C 6.7 she sees Dr Christine on Monday for final post op      Last edited by Valdemar Rogue, MD on 04/19/2024  4:17 PM.    Pt states she can see an improvement OD in brightness and colors after cataract sx on 12.12.25 w/ Dr. Fleeta  Referring physician: [name], MD [address] [place],  [place] [address]  HISTORICAL INFORMATION:  Selected notes from the MEDICAL RECORD NUMBER Referred by Dr. Fleeta for ARMD eval LEE:  Ocular Hx- PMH-   CURRENT MEDICATIONS: Current Outpatient Medications (Ophthalmic Drugs)  Medication Sig   SYSTANE COMPLETE 0.6 % SOLN Apply 1 drop to eye 2 (two) times daily.   No current facility-administered medications for this visit. (Ophthalmic Drugs)   Current Outpatient Medications (Other)  Medication Sig   acetaminophen  (TYLENOL ) 500 MG tablet Take 500 mg by mouth every 6 (six) hours as needed for moderate pain.   albuterol  (VENTOLIN  HFA) 108 (90 Base) MCG/ACT inhaler INHALE 2 PUFFS INTO THE LUNGS EVERY 4 HOURS AS NEEDED FOR WHEEZE OR FOR SHORTNESS OF BREATH   cholecalciferol (VITAMIN D3) 25 MCG (1000 UNIT) tablet Take 1,000 Units by mouth daily.   clorazepate  (TRANXENE ) 3.75 MG tablet Take 1 tablet (3.75 mg total) by  mouth 3 (three) times daily as needed for anxiety.   cyclobenzaprine  (FLEXERIL ) 10 MG tablet TAKE 1/2 TABLET BY MOUTH AT BEDTIME AS NEEDED FOR MUSCLE SPASMS   desvenlafaxine  (PRISTIQ ) 100 MG 24 hr tablet Take 1 tablet (100 mg total) by mouth daily. (Patient taking differently: Take 100 mg by mouth daily. As needed)   gabapentin  (NEURONTIN ) 100 MG capsule Take 3 pills by mouth in the am and 2 pills in the pm with caution of sedation (Patient not taking: Reported on 04/19/2024)   lactulose  (CHRONULAC ) 10 GM/15ML solution TAKE 15 ML BY MOUTH 2 TIMES DAILY AS NEEDED FOR SEVERE CONSTIPATION.   metFORMIN  (GLUCOPHAGE -XR) 500 MG 24 hr tablet Take 1 tablet (500 mg total) by mouth daily with breakfast.   Multiple Vitamin (MULTIVITAMIN) tablet Take 1 tablet by mouth daily.   Multiple Vitamins-Minerals (PRESERVISION AREDS 2) CAPS Take 1 capsule by mouth 2 (two) times daily.   mupirocin  ointment (BACTROBAN ) 2 % APPLY 1 APPLICATION TOPICALLY 2 TIMES DAILY AS NEEDED.   nadolol  (CORGARD ) 20 MG tablet TAKE 1 TABLET BY MOUTH EVERY DAY   nystatin  (MYCOSTATIN ) 100000 UNIT/ML suspension TAKE 5 MLS (500,000 UNITS TOTAL) BY MOUTH 3 (THREE) TIMES DAILY. SWISH AND SWALLOW (Patient taking differently: Take 5 mLs by mouth 3 (three) times daily as needed. Swish and swallow)  pantoprazole  (PROTONIX ) 40 MG tablet TAKE 1 TABLET BY MOUTH EVERY DAY (Patient taking differently: Take 40 mg by mouth 2 (two) times daily.)   Tiotropium Bromide Monohydrate  (SPIRIVA  RESPIMAT) 2.5 MCG/ACT AERS INHALE 2 PUFFS BY MOUTH INTO THE LUNGS DAILY   ursodiol  (ACTIGALL ) 300 MG capsule TAKE 1 CAPSULE BY MOUTH EVERY DAY   XIFAXAN  550 MG TABS tablet TAKE 1 TABLET BY MOUTH 2 TIMES DAILY.   No current facility-administered medications for this visit. (Other)   REVIEW OF SYSTEMS: ROS   Positive for: Eyes, Allergic/Imm Last edited by Resa Delon ORN, COT on 04/18/2024  2:24 PM.        ALLERGIES Allergies  Allergen Reactions   Anoro Ellipta   [Umeclidinium-Vilanterol] Cough   Bevespi  Aerosphere [Glycopyrrolate-Formoterol ] Cough    Prolonged coughing episode with first dose   Oxycodone  Nausea And Vomiting   Crestor  [Rosuvastatin ]     Abdominal pain     Glipizide  Other (See Comments)    Stomach pain   Losartan      Pt thinks it caused urinary retention     Spironolactone      Urinary retention     PAST MEDICAL HISTORY Past Medical History:  Diagnosis Date   Alcohol abuse, unspecified    Breast cancer (HCC) 1998   Right   Cataract    left eye   Cervical spondylosis 2006   MRI   Degeneration of cervical intervertebral disc 2006   MRI   Diabetes mellitus without complication (HCC)    Diverticulosis of colon (without mention of hemorrhage)    Hyperpotassemia    Lung cancer (HCC) 03/2021   started radiation in January 2023, right side   Microscopic hematuria    Mononeuritis of unspecified site    Nonspecific abnormal results of liver function study    Other abnormal glucose    Other and unspecified hyperlipidemia    no per pt   Other chronic nonalcoholic liver disease    Personal history of chemotherapy    Personal history of malignant neoplasm of breast    Personal history of radiation therapy    Pneumothorax, acute    right, spontaneous   Tobacco use disorder    Unspecified vitamin D  deficiency    Past Surgical History:  Procedure Laterality Date   BIOPSY  07/27/2019   Procedure: BIOPSY;  Surgeon: Leigh Elspeth SQUIBB, MD;  Location: [facility];  Service: Gastroenterology;;   BREAST BIOPSY  12/2001   Right   BREAST LUMPECTOMY Right 1998   CATARACT EXTRACTION Right 03/23/2024   CHEST TUBE INSERTION  11/02/2014   COLONOSCOPY     COLONOSCOPY WITH PROPOFOL  N/A 07/27/2019   Procedure: COLONOSCOPY WITH PROPOFOL ;  Surgeon: Leigh Elspeth SQUIBB, MD;  Location: [facility];  Service: Gastroenterology;  Laterality: N/A;   COLONOSCOPY WITH PROPOFOL  N/A 07/29/2022   Procedure: COLONOSCOPY WITH PROPOFOL ;   Surgeon: Shila Gustav GAILS, MD;  Location: [facility];  Service: Gastroenterology;  Laterality: N/A;   ESOPHAGOGASTRODUODENOSCOPY (EGD) WITH PROPOFOL  N/A 10/30/2018   Procedure: ESOPHAGOGASTRODUODENOSCOPY (EGD) WITH PROPOFOL ;  Surgeon: Shila Gustav GAILS, MD;  Location: [facility];  Service: Endoscopy;  Laterality: N/A;   ESOPHAGOGASTRODUODENOSCOPY (EGD) WITH PROPOFOL  N/A 03/12/2019   Procedure: ESOPHAGOGASTRODUODENOSCOPY (EGD) WITH PROPOFOL ;  Surgeon: Shila Gustav GAILS, MD;  Location: [facility];  Service: Endoscopy;  Laterality: N/A;   ESOPHAGOGASTRODUODENOSCOPY (EGD) WITH PROPOFOL  N/A 07/27/2019   Procedure: ESOPHAGOGASTRODUODENOSCOPY (EGD) WITH PROPOFOL ;  Surgeon: Leigh Elspeth SQUIBB, MD;  Location: [facility];  Service: Gastroenterology;  Laterality: N/A;   EYE SURGERY  02/2017   cataract extraction with lens implant-left   HEMOSTASIS CLIP PLACEMENT  07/29/2022   Procedure: HEMOSTASIS CLIP PLACEMENT;  Surgeon: Nandigam, Kavitha V, MD;  Location: [facility];  Service: Gastroenterology;;   HOT HEMOSTASIS N/A 07/27/2019   Procedure: HOT HEMOSTASIS (ARGON PLASMA COAGULATION/BICAP);  Surgeon: Leigh Elspeth SQUIBB, MD;  Location: [facility];  Service: Gastroenterology;  Laterality: N/A;   MOUTH SURGERY     POLYPECTOMY  07/27/2019   Procedure: POLYPECTOMY;  Surgeon: Leigh Elspeth SQUIBB, MD;  Location: [facility];  Service: Gastroenterology;;   POLYPECTOMY  07/29/2022   Procedure: POLYPECTOMY;  Surgeon: Shila Gustav GAILS, MD;  Location: [facility];  Service: Gastroenterology;;   TUBAL LIGATION     FAMILY HISTORY Family History  Problem Relation Age of Onset   Stroke Mother    Heart failure Father    Heart attack Father    Heart failure Sister    Colon cancer Maternal Uncle    Esophageal cancer Neg Hx    Rectal cancer Neg Hx    Stomach cancer Neg Hx    Pancreatic cancer Neg Hx    SOCIAL HISTORY Social History   Tobacco Use   Smoking status: Every Day     Current packs/day: 1.00    Average packs/day: 1 pack/day for 59.5 years (59.5 ttl pk-yrs)    Types: Cigarettes    Start date: 09/25/2021    Passive exposure: Past   Smokeless tobacco: Never   Tobacco comments:    Started smoking at 74 years old.     1PPD 12/20/2022 khj  Vaping Use   Vaping status: Never Used  Substance Use Topics   Alcohol use: Not Currently   Drug use: No       OPHTHALMIC EXAM:  Base Eye Exam     Visual Acuity (Snellen - Linear)       Right Left   Dist [place] 20/80 20/300 -1   Dist ph [place] 20/30 -1 20/60 -1    Correction: Glasses         Tonometry (Applanation, 2:33 PM)       Right Left   Pressure 15 19         Pupils       Pupils Dark Light Shape React APD   Right PERRL 3 2 Round Brisk None   Left PERRL 3 2 Round Brisk None         Visual Fields       Left Right    Full Full         Extraocular Movement       Right Left    Full, Ortho Full, Ortho         Neuro/Psych     Oriented x3: Yes   Mood/Affect: Normal         Dilation     Both eyes: 2.5% Phenylephrine @ 2:33 PM           Slit Lamp and Fundus Exam     External Exam       Right Left   External Normal Normal         Slit Lamp Exam       Right Left   Lids/Lashes Normal Meibomian gland dysfunction   Conjunctiva/Sclera White and quiet White and quiet   Cornea Trace Punctate epithelial erosions, Debris in tear film, Well healed temporal cataract wound Trace Punctate epithelial erosions, Arcus, Well healed temporal cataract wound   Anterior Chamber Deep, narrow temporal angle Deep and quiet  Iris Round and dilated, No NVI Round and poorly dilated   Lens PC IOL in good position Sutured 3PC IOL in good position (? akreos with mild superior displacement)   Anterior Vitreous Vitreous syneresis post vitrectomy, clear         Fundus Exam       Right Left   Disc Pink and sharp, paripapilary  CNV w/ edema nasal disc Pallor, Sharp rim, Temporal PPA   C/D  Ratio 0.2 0.3   Macula Flat, Blunted foveal reflex, RPE mottling, clumping, and atrophy, early GA, no heme, mild peripapillary edema nasal mac Flat, Blunted foveal reflex, Drusen, RPE mottling, clumping, and atrophy, + GA nasal mac with pigment ring   Vessels Vascular attenuation, Tortuous Vascular attenuation, Tortuous   Periphery Attached, Reticular degeneration, No heme Attached, Reticular degeneration, No heme           Refraction     Wearing Rx       Sphere Cylinder Axis Add   Right -3.00 +1.00 013 +2.50   Left -3.75 +2.00 016 +2.50           IMAGING AND PROCEDURES  Imaging and Procedures for 04/18/2024  OCT, Retina - OU - Both Eyes       Right Eye Quality was good. Central Foveal Thickness: 253. Progression has improved. Findings include normal foveal contour, no SRF, retinal drusen , subretinal hyper-reflective material, intraretinal hyper-reflective material, outer retinal atrophy (Peripapillary SRHM/edema nasal and temporal to disc seen best on widefield--slightly improved).   Left Eye Quality was good. Central Foveal Thickness: 252. Progression has been stable. Findings include no IRF, no SRF, abnormal foveal contour, retinal drusen , intraretinal hyper-reflective material, outer retinal atrophy (Nasal ORA/GA with extension to fovea).   Notes *Images captured and stored on drive  Diagnosis / Impression:  OD: Peripapillary SRHM/edema nasal and temporal of disc seen best on widefield--improved OS: Nasal ORA/GA with extension to fovea  Clinical management:  See below  Abbreviations: NFP - Normal foveal profile. CME - cystoid macular edema. PED - pigment epithelial detachment. IRF - intraretinal fluid. SRF - subretinal fluid. EZ - ellipsoid zone. ERM - epiretinal membrane. ORA - outer retinal atrophy. ORT - outer retinal tubulation. SRHM - subretinal hyper-reflective material. IRHM - intraretinal hyper-reflective material      Intravitreal Injection, Pharmacologic  Agent - OD - Right Eye       Time Out 04/18/2024. 3:17 PM. Confirmed correct patient, procedure, site, and patient consented.   Anesthesia Topical anesthesia was used. Anesthetic medications included Lidocaine  2%, Proparacaine 0.5%.   Procedure Preparation included 5% betadine to ocular surface, eyelid speculum. A (32g) needle was used.   Injection: 1.25 mg Bevacizumab  1.25mg /0.23ml   Route: Intravitreal, Site: Right Eye   NDC: [id number], Lot: [id number], Expiration date: 04/28/2024   Post-op Post injection exam found visual acuity of at least counting fingers. The patient tolerated the procedure well. There were no complications. The patient received written and verbal post procedure care education. Post injection medications were not given.              ASSESSMENT/PLAN:   ICD-10-CM   1. Exudative age-related macular degeneration of right eye with active choroidal neovascularization (HCC)  H35.3211 OCT, Retina - OU - Both Eyes    Intravitreal Injection, Pharmacologic Agent - OD - Right Eye    Bevacizumab  (AVASTIN ) SOLN 1.25 mg    2. Advanced atrophic nonexudative age-related macular degeneration of left eye without subfoveal involvement  H35.3123  3. Diabetes mellitus without complication (HCC)  E11.9     4. Diabetes mellitus treated with oral medication (HCC)  E11.9    Z79.84     5. Pseudophakia of both eyes  Z96.1      Exudative age related macular degeneration, OD - s/p IVA OD #1 (10.29.25), #2 (11.26.25) - BCVA OD 20/30 from 20/40 - s/p CE IOL 12.12.25 - OCT shows OD: Peripapillary SRHM/edema nasal and temporal to disc seen best on widefield -- slightly improved at 6 weeks due to holiday  - recommend IVA OD #3 today, 01.07.26 for exudative ARMD / peripapillary CNV, w/ f/u back to 4 wks  - pt wishes to be treated with IVA - RBA of procedure discussed, questions answered - informed consent obtained and signed - see procedure note   - f/u in 4 wks -- DFE/OCT,  possible injection   2. Age related macular degeneration, non-exudative, OS - The incidence, anatomy, and pathology of dry AMD, risk of progression, and the AREDS and AREDS 2 studies including smoking risks discussed with patient. - OCT shows OS: Nasal ORA/GA with extension to fovea  - Recommend amsler grid monitoring  3,4. Diabetes mellitus, type 2 without retinopathy  - A1C 6.3 (12.30.25), 6.7 (09.03.25) - The incidence, risk factors for progression, natural history and treatment options for diabetic retinopathy  were discussed with patient.   - The need for close monitoring of blood glucose, blood pressure, and serum lipids, avoiding cigarette or any type of tobacco, and the need for long term follow up was also discussed with patient. - f/u in 1 year, sooner prn  5. Pseudophakia OU  - s/p sutured IOL? OS - Dr. Raj at [facility] - OD 12.12.25 w/ Dr Christine  - IOL in good position  - post op drops per Dr. Fleeta  - monitor   Ophthalmic Meds Ordered this visit:  Meds ordered this encounter  Medications   Bevacizumab  (AVASTIN ) SOLN 1.25 mg     Return in about 4 weeks (around 05/16/2024) for exu ARMD OD, DFE, OCT, likely IVA OD.  There are no Patient Instructions on file for this visit.  Explained the diagnoses, plan, and follow up with the patient and they expressed understanding.  Patient expressed understanding of the importance of proper follow up care.   This document serves as a record of services personally performed by Redell JUDITHANN Hans, MD, PhD. It was created on their behalf by Almetta Pesa, an ophthalmic technician. The creation of this record is the provider's dictation and/or activities during the visit.    Electronically signed by: Almetta Pesa, OA, 04/19/2024  4:20 PM  This document serves as a record of services personally performed by Redell JUDITHANN Hans, MD, PhD. It was created on their behalf by Wanda GEANNIE Keens, COT an ophthalmic technician. The creation of  this record is the provider's dictation and/or activities during the visit.    Electronically signed by:  Wanda GEANNIE Keens, COT  04/19/2024 4:20 PM  Redell JUDITHANN Hans, M.D., Ph.D. Diseases & Surgery of the Retina and Vitreous [facility] 04/18/2024  I have reviewed the above documentation for accuracy and completeness, and I agree with the above. Redell JUDITHANN Hans, M.D., Ph.D. 04/19/2024 4:21 PM    Abbreviations: M myopia (nearsighted); A astigmatism; H hyperopia (farsighted); P presbyopia; Mrx spectacle prescription;  CTL contact lenses; OD right eye; OS left eye; OU both eyes  XT exotropia; ET esotropia; PEK punctate epithelial keratitis; PEE punctate  epithelial erosions; DES dry eye syndrome; MGD meibomian gland dysfunction; ATs artificial tears; PFAT's preservative free artificial tears; NSC nuclear sclerotic cataract; PSC posterior subcapsular cataract; ERM epi-retinal membrane; PVD posterior vitreous detachment; RD retinal detachment; DM diabetes mellitus; DR diabetic retinopathy; NPDR non-proliferative diabetic retinopathy; PDR proliferative diabetic retinopathy; CSME clinically significant macular edema; DME diabetic macular edema; dbh dot blot hemorrhages; CWS cotton wool spot; POAG primary open angle glaucoma; C/D cup-to-disc ratio; HVF humphrey visual field; GVF goldmann visual field; OCT optical coherence tomography; IOP intraocular pressure; BRVO Branch retinal vein occlusion; CRVO central retinal vein occlusion; CRAO central retinal artery occlusion; BRAO branch retinal artery occlusion; RT retinal tear; SB scleral buckle; PPV pars plana vitrectomy; VH Vitreous hemorrhage; PRP panretinal laser photocoagulation; IVK intravitreal kenalog ; VMT vitreomacular traction; MH Macular hole;  NVD neovascularization of the disc; NVE neovascularization elsewhere; AREDS age related eye disease study; ARMD age related macular degeneration; POAG primary open angle glaucoma; EBMD  epithelial/anterior basement membrane dystrophy; ACIOL anterior chamber intraocular lens; IOL intraocular lens; PCIOL posterior chamber intraocular lens; Phaco/IOL phacoemulsification with intraocular lens placement; PRK photorefractive keratectomy; LASIK laser assisted in situ keratomileusis; HTN hypertension; DM diabetes mellitus; COPD chronic obstructive pulmonary disease

## 2024-04-14 ENCOUNTER — Other Ambulatory Visit: Payer: Self-pay | Admitting: Family Medicine

## 2024-04-16 NOTE — Telephone Encounter (Signed)
 Please verify if pt is still taking this before I refill  ? Still taking and if so still the same dose or less?   I know symptoms had improved

## 2024-04-17 ENCOUNTER — Telehealth: Payer: Self-pay

## 2024-04-17 NOTE — Telephone Encounter (Signed)
 Attempted to call pt but was unable to get through, will send a mychart message if able.

## 2024-04-17 NOTE — Telephone Encounter (Signed)
 Left message to return call to our office.  Please provide  message from provider/office when call is returned from patient.

## 2024-04-17 NOTE — Telephone Encounter (Signed)
 Copied from CRM [id number]. Topic: Appointments - Scheduling Inquiry for Clinic >> Apr 17, 2024  1:43 PM Leila BROCKS wrote: Reason for CRM: Patient [phone] wants to see if appointment 04/20/24 at 10 am with Dr. Tamea can be rescheduled until July 2026? Informed patient last saw Dr. Tamea 01/17/24 and was advised to follow up in 6 weeks around 03/09/24. Patient states if that does not suite Dr. Tamea, can patient schedule an appointment next week instead? Please advise.

## 2024-04-17 NOTE — Telephone Encounter (Signed)
 She may postpone to July if she is doing well.

## 2024-04-18 ENCOUNTER — Ambulatory Visit (INDEPENDENT_AMBULATORY_CARE_PROVIDER_SITE_OTHER): Admitting: Ophthalmology

## 2024-04-18 ENCOUNTER — Encounter (INDEPENDENT_AMBULATORY_CARE_PROVIDER_SITE_OTHER): Payer: Self-pay | Admitting: Ophthalmology

## 2024-04-18 DIAGNOSIS — H353211 Exudative age-related macular degeneration, right eye, with active choroidal neovascularization: Secondary | ICD-10-CM

## 2024-04-18 DIAGNOSIS — Z961 Presence of intraocular lens: Secondary | ICD-10-CM | POA: Diagnosis not present

## 2024-04-18 DIAGNOSIS — H353123 Nonexudative age-related macular degeneration, left eye, advanced atrophic without subfoveal involvement: Secondary | ICD-10-CM

## 2024-04-18 DIAGNOSIS — E119 Type 2 diabetes mellitus without complications: Secondary | ICD-10-CM

## 2024-04-18 DIAGNOSIS — H25811 Combined forms of age-related cataract, right eye: Secondary | ICD-10-CM

## 2024-04-18 DIAGNOSIS — Z7984 Long term (current) use of oral hypoglycemic drugs: Secondary | ICD-10-CM

## 2024-04-19 ENCOUNTER — Encounter: Payer: Self-pay | Admitting: Gastroenterology

## 2024-04-19 ENCOUNTER — Other Ambulatory Visit

## 2024-04-19 ENCOUNTER — Ambulatory Visit: Admitting: Gastroenterology

## 2024-04-19 ENCOUNTER — Encounter (INDEPENDENT_AMBULATORY_CARE_PROVIDER_SITE_OTHER): Payer: Self-pay | Admitting: Ophthalmology

## 2024-04-19 ENCOUNTER — Other Ambulatory Visit (INDEPENDENT_AMBULATORY_CARE_PROVIDER_SITE_OTHER)

## 2024-04-19 ENCOUNTER — Ambulatory Visit: Payer: Self-pay | Admitting: Gastroenterology

## 2024-04-19 VITALS — BP 118/60 | HR 82 | Ht 65.0 in | Wt 108.8 lb

## 2024-04-19 DIAGNOSIS — E538 Deficiency of other specified B group vitamins: Secondary | ICD-10-CM | POA: Diagnosis not present

## 2024-04-19 DIAGNOSIS — D500 Iron deficiency anemia secondary to blood loss (chronic): Secondary | ICD-10-CM | POA: Diagnosis not present

## 2024-04-19 DIAGNOSIS — K7030 Alcoholic cirrhosis of liver without ascites: Secondary | ICD-10-CM

## 2024-04-19 LAB — IBC + FERRITIN
Ferritin: 7.8 ng/mL — ABNORMAL LOW (ref 10.0–291.0)
Iron: 63 ug/dL (ref 42–145)
Saturation Ratios: 11.3 % — ABNORMAL LOW (ref 20.0–50.0)
TIBC: 560 ug/dL — ABNORMAL HIGH (ref 250.0–450.0)
Transferrin: 400 mg/dL — ABNORMAL HIGH (ref 212.0–360.0)

## 2024-04-19 LAB — COMPREHENSIVE METABOLIC PANEL WITH GFR
ALT: 8 U/L (ref 3–35)
AST: 17 U/L (ref 5–37)
Albumin: 4.4 g/dL (ref 3.5–5.2)
Alkaline Phosphatase: 96 U/L (ref 39–117)
BUN: 8 mg/dL (ref 6–23)
CO2: 31 meq/L (ref 19–32)
Calcium: 9.8 mg/dL (ref 8.4–10.5)
Chloride: 96 meq/L (ref 96–112)
Creatinine, Ser: 0.58 mg/dL (ref 0.40–1.20)
GFR: 89.82 mL/min
Glucose, Bld: 114 mg/dL — ABNORMAL HIGH (ref 70–99)
Potassium: 4 meq/L (ref 3.5–5.1)
Sodium: 134 meq/L — ABNORMAL LOW (ref 135–145)
Total Bilirubin: 0.5 mg/dL (ref 0.2–1.2)
Total Protein: 7.3 g/dL (ref 6.0–8.3)

## 2024-04-19 LAB — CBC WITH DIFFERENTIAL/PLATELET
Basophils Absolute: 0 K/uL (ref 0.0–0.1)
Basophils Relative: 0.6 % (ref 0.0–3.0)
Eosinophils Absolute: 0.1 K/uL (ref 0.0–0.7)
Eosinophils Relative: 0.7 % (ref 0.0–5.0)
HCT: 40.7 % (ref 36.0–46.0)
Hemoglobin: 13.6 g/dL (ref 12.0–15.0)
Lymphocytes Relative: 24.2 % (ref 12.0–46.0)
Lymphs Abs: 2 K/uL (ref 0.7–4.0)
MCHC: 33.4 g/dL (ref 30.0–36.0)
MCV: 90.7 fl (ref 78.0–100.0)
Monocytes Absolute: 0.8 K/uL (ref 0.1–1.0)
Monocytes Relative: 9.7 % (ref 3.0–12.0)
Neutro Abs: 5.4 K/uL (ref 1.4–7.7)
Neutrophils Relative %: 64.8 % (ref 43.0–77.0)
Platelets: 235 K/uL (ref 150.0–400.0)
RBC: 4.49 Mil/uL (ref 3.87–5.11)
RDW: 16.1 % — ABNORMAL HIGH (ref 11.5–15.5)
WBC: 8.3 K/uL (ref 4.0–10.5)

## 2024-04-19 LAB — B12 AND FOLATE PANEL
Folate: 17.9 ng/mL
Vitamin B-12: 763 pg/mL (ref 211–911)

## 2024-04-19 LAB — PROTIME-INR
INR: 1.1 ratio — ABNORMAL HIGH (ref 0.8–1.0)
Prothrombin Time: 11.6 s (ref 9.6–13.1)

## 2024-04-19 MED ORDER — BEVACIZUMAB CHEMO INJECTION 1.25MG/0.05ML SYRINGE FOR KALEIDOSCOPE
1.2500 mg | INTRAVITREAL | Status: AC | PRN
  Administered 2024-04-19: 1.25 mg via INTRAVITREAL

## 2024-04-19 NOTE — Progress Notes (Signed)
 "                Christine Barton    [id number]    March 14, 1951  Primary Care Physician:Tower, Laine LABOR, MD  Referring Physician: Tower, Laine LABOR, MD [address] [place],  [place] [address]   Chief complaint:  Cirrhosis  Discussed the use of AI scribe software for clinical note transcription with the patient, who gave verbal consent to proceed.  History of Present Illness Christine Barton is a 74 year old female with alcoholic cirrhosis who presents for routine follow-up of her liver disease.  Alcoholic cirrhosis - Several years of abstinence from alcohol - No symptoms of hepatic decompensation, including no ascites, jaundice, or encephalopathy - MELD score has been stable around 6-7 on prior laboratory evaluations - Not currently under the care of a liver specialist - No current breathing difficulties  Lumbar degenerative disc disease - Significant episode of left-sided pain and inflammation over the past year, lasting approximately three months - Pain onset attributed to straining while lifting and walking up stairs - Imaging demonstrated disc bulging and degenerative spinal changes without evidence of fracture or tumor - Pain has significantly improved and is now only occasional - Able to perform daily activities and describes being on the tail end of this episode  Tobacco use - Smokes one pack of cigarettes per day - Not ready to quit smoking  Vitamin b12 deficiency - Recently started vitamin B12 supplementation for previously low levels   MR abd with and without contrast 05/10/23 1. Cirrhotic liver configuration with diffuse areas of confluent hepatic fibrosis. No focal liver lesion seen. 2. Small volume cholelithiasis without acute cholecystitis. 3. Mildly dilated extrahepatic bile duct however, it gradually tapers near the ampulla of Vater and there is no obstructing mass/choledocholithiasis. Findings are similar to the prior study and likely age related. Correlate  clinically and with liver function test. 4. Multiple other nonacute observations, as described above.   Colonoscopy 07-29-22 - Hemorrhoids found on perianal exam.  - Five 4 to 7 mm polyps in the transverse colon, in the ascending colon and in the cecum, removed with a cold snare. Resected and retrieved. Clips were placed. Clip manufacturer: [employer].  - Diverticulosis in the sigmoid colon, in the descending colon, in the transverse colon and in the ascending colon.  - Non-bleeding internal hemorrhoids. A. COLON, CECUM, ASCENDING, POLYPECTOMY:  - Tubular adenoma (one fragment)  - Sessile serrated polyp without cytologic dysplasia (one fragment)  - Colonic mucosa with surface hyperplastic changes (multiple fragments)  - Negative for high-grade dysplasia or malignancy   B. COLON, TRANSVERSE, POLYPECTOMY:  - Tubular adenoma(s) (two fragments)  - Negative for high-grade dysplasia or malignancy   MR abdomen w wo contrast 01-12-22 1. No definite evidence of metastatic disease in the abdomen. 2. Advanced cirrhosis with developing hepatic fibrosis in a background of hepatic iron  deposition. 3. Cholelithiasis without evidence of acute cholecystitis. 4. Chronic dilatation of the common bile duct. No choledocholithiasis or definite findings to suggest obstruction.   MRI abdomen/liver December 31, 2020 1. Cirrhosis. No liver masses. 2. Trace perihepatic ascites.  Normal size spleen. 3. Cholelithiasis. Chronic mild central intrahepatic and extrahepatic biliary ductal dilatation, unchanged. CBD diameter 9 mm. No evidence of choledocholithiasis. 4. Chronic mild dilation of the dorsal pancreatic duct in the pancreatic head, unchanged, nonspecific. No pancreatic mass.   Abdominal ultrasound April 30, 2020 1. Cirrhotic liver morphology.  No focal liver lesion is identified. 2. Cholelithiasis with  a positive sonographic Murphy sign. Findings raise suspicion for acute cholecystitis.    Normal AFP 3.7 in January 2022 MRI liver 01-08-2020: Stable cirrhosis with no evidence of hepatocellular carcinoma. Choledocholithiasis with stable CBD. Increased iron  deposition in the setting of IV iron  therapy   EGD July 27, 2019 by Dr. Leigh: Small esophageal varices and portal hypertensive gastropathy otherwise normal exam   Colonoscopy July 27, 2019: 8 small AVMs scattered in the colon ablated with APC and 3 small polyps removed, diverticulosis and internal hemorrhoids   EGD February 23, 2019: Small grade 1 esophageal varices, portal hypertensive gastropathy   Colonoscopy November 30, 2016: Diverticulosis, sessile polyps X4 tubular adenomas and internal hemorrhoids.  Recall colonoscopy in 3 years   Outpatient Encounter Medications as of 04/19/2024  Medication Sig   acetaminophen  (TYLENOL ) 500 MG tablet Take 500 mg by mouth every 6 (six) hours as needed for moderate pain.   albuterol  (VENTOLIN  HFA) 108 (90 Base) MCG/ACT inhaler INHALE 2 PUFFS INTO THE LUNGS EVERY 4 HOURS AS NEEDED FOR WHEEZE OR FOR SHORTNESS OF BREATH   cholecalciferol (VITAMIN D3) 25 MCG (1000 UNIT) tablet Take 1,000 Units by mouth daily.   clorazepate  (TRANXENE ) 3.75 MG tablet Take 1 tablet (3.75 mg total) by mouth 3 (three) times daily as needed for anxiety.   cyclobenzaprine  (FLEXERIL ) 10 MG tablet TAKE 1/2 TABLET BY MOUTH AT BEDTIME AS NEEDED FOR MUSCLE SPASMS   desvenlafaxine  (PRISTIQ ) 100 MG 24 hr tablet Take 1 tablet (100 mg total) by mouth daily. (Patient taking differently: Take 100 mg by mouth daily. As needed)   lactulose  (CHRONULAC ) 10 GM/15ML solution TAKE 15 ML BY MOUTH 2 TIMES DAILY AS NEEDED FOR SEVERE CONSTIPATION.   metFORMIN  (GLUCOPHAGE -XR) 500 MG 24 hr tablet Take 1 tablet (500 mg total) by mouth daily with breakfast.   Multiple Vitamin (MULTIVITAMIN) tablet Take 1 tablet by mouth daily.   Multiple Vitamins-Minerals (PRESERVISION AREDS 2) CAPS Take 1 capsule by mouth 2 (two) times daily.    mupirocin  ointment (BACTROBAN ) 2 % APPLY 1 APPLICATION TOPICALLY 2 TIMES DAILY AS NEEDED.   nadolol  (CORGARD ) 20 MG tablet TAKE 1 TABLET BY MOUTH EVERY DAY   nystatin  (MYCOSTATIN ) 100000 UNIT/ML suspension TAKE 5 MLS (500,000 UNITS TOTAL) BY MOUTH 3 (THREE) TIMES DAILY. SWISH AND SWALLOW (Patient taking differently: Take 5 mLs by mouth 3 (three) times daily as needed. Swish and swallow)   pantoprazole  (PROTONIX ) 40 MG tablet TAKE 1 TABLET BY MOUTH EVERY DAY (Patient taking differently: Take 40 mg by mouth 2 (two) times daily.)   SYSTANE COMPLETE 0.6 % SOLN Apply 1 drop to eye 2 (two) times daily.   Tiotropium Bromide Monohydrate  (SPIRIVA  RESPIMAT) 2.5 MCG/ACT AERS INHALE 2 PUFFS BY MOUTH INTO THE LUNGS DAILY   ursodiol  (ACTIGALL ) 300 MG capsule TAKE 1 CAPSULE BY MOUTH EVERY DAY   XIFAXAN  550 MG TABS tablet TAKE 1 TABLET BY MOUTH 2 TIMES DAILY.   gabapentin  (NEURONTIN ) 100 MG capsule Take 3 pills by mouth in the am and 2 pills in the pm with caution of sedation (Patient not taking: Reported on 04/19/2024)   No facility-administered encounter medications on file as of 04/19/2024.    Allergies as of 04/19/2024 - Review Complete 04/19/2024  Allergen Reaction Noted   Anoro ellipta  [umeclidinium-vilanterol] Cough 08/03/2022   Bevespi  aerosphere [glycopyrrolate-formoterol ] Cough 08/03/2022   Oxycodone  Nausea And Vomiting 11/02/2014   Crestor  [rosuvastatin ]  11/12/2020   Glipizide  Other (See Comments) 09/12/2013   Losartan   10/11/2023   Spironolactone   10/11/2023    Past Medical History:  Diagnosis Date   Alcohol abuse, unspecified    Breast cancer (HCC) 1998   Right   Cataract    left eye   Cervical spondylosis 2006   MRI   Degeneration of cervical intervertebral disc 2006   MRI   Diabetes mellitus without complication (HCC)    Diverticulosis of colon (without mention of hemorrhage)    Hyperpotassemia    Lung cancer (HCC) 03/2021   started radiation in January 2023, right side    Microscopic hematuria    Mononeuritis of unspecified site    Nonspecific abnormal results of liver function study    Other abnormal glucose    Other and unspecified hyperlipidemia    no per pt   Other chronic nonalcoholic liver disease    Personal history of chemotherapy    Personal history of malignant neoplasm of breast    Personal history of radiation therapy    Pneumothorax, acute    right, spontaneous   Tobacco use disorder    Unspecified vitamin D  deficiency     Past Surgical History:  Procedure Laterality Date   BIOPSY  07/27/2019   Procedure: BIOPSY;  Surgeon: Leigh Elspeth SQUIBB, MD;  Location: [facility];  Service: Gastroenterology;;   BREAST BIOPSY  12/2001   Right   BREAST LUMPECTOMY Right 1998   CATARACT EXTRACTION Right 03/23/2024   CHEST TUBE INSERTION  11/02/2014   COLONOSCOPY     COLONOSCOPY WITH PROPOFOL  N/A 07/27/2019   Procedure: COLONOSCOPY WITH PROPOFOL ;  Surgeon: Leigh Elspeth SQUIBB, MD;  Location: [facility];  Service: Gastroenterology;  Laterality: N/A;   COLONOSCOPY WITH PROPOFOL  N/A 07/29/2022   Procedure: COLONOSCOPY WITH PROPOFOL ;  Surgeon: Shila Gustav GAILS, MD;  Location: [facility];  Service: Gastroenterology;  Laterality: N/A;   ESOPHAGOGASTRODUODENOSCOPY (EGD) WITH PROPOFOL  N/A 10/30/2018   Procedure: ESOPHAGOGASTRODUODENOSCOPY (EGD) WITH PROPOFOL ;  Surgeon: Shila Gustav GAILS, MD;  Location: [facility];  Service: Endoscopy;  Laterality: N/A;   ESOPHAGOGASTRODUODENOSCOPY (EGD) WITH PROPOFOL  N/A 03/12/2019   Procedure: ESOPHAGOGASTRODUODENOSCOPY (EGD) WITH PROPOFOL ;  Surgeon: Shila Gustav GAILS, MD;  Location: [facility];  Service: Endoscopy;  Laterality: N/A;   ESOPHAGOGASTRODUODENOSCOPY (EGD) WITH PROPOFOL  N/A 07/27/2019   Procedure: ESOPHAGOGASTRODUODENOSCOPY (EGD) WITH PROPOFOL ;  Surgeon: Leigh Elspeth SQUIBB, MD;  Location: [facility];  Service: Gastroenterology;  Laterality: N/A;   EYE SURGERY  02/2017   cataract extraction  with lens implant-left   HEMOSTASIS CLIP PLACEMENT  07/29/2022   Procedure: HEMOSTASIS CLIP PLACEMENT;  Surgeon: Shila Gustav GAILS, MD;  Location: [facility];  Service: Gastroenterology;;   HOT HEMOSTASIS N/A 07/27/2019   Procedure: HOT HEMOSTASIS (ARGON PLASMA COAGULATION/BICAP);  Surgeon: Leigh Elspeth SQUIBB, MD;  Location: [facility];  Service: Gastroenterology;  Laterality: N/A;   MOUTH SURGERY     POLYPECTOMY  07/27/2019   Procedure: POLYPECTOMY;  Surgeon: Leigh Elspeth SQUIBB, MD;  Location: [facility];  Service: Gastroenterology;;   POLYPECTOMY  07/29/2022   Procedure: POLYPECTOMY;  Surgeon: Shila Gustav GAILS, MD;  Location: [facility];  Service: Gastroenterology;;   TUBAL LIGATION      Family History  Problem Relation Age of Onset   Stroke Mother    Heart failure Father    Heart attack Father    Heart failure Sister    Colon cancer Maternal Uncle    Esophageal cancer Neg Hx    Rectal cancer Neg Hx    Stomach cancer Neg Hx    Pancreatic cancer Neg Hx     Social  History   Socioeconomic History   Marital status: Single    Spouse name: Not on file   Number of children: 1   Years of education: Not on file   Highest education level: Not on file  Occupational History   Occupation: retired    Associate Professor: [employer]  Tobacco Use   Smoking status: Every Day    Current packs/day: 1.00    Average packs/day: 1 pack/day for 59.5 years (59.5 ttl pk-yrs)    Types: Cigarettes    Start date: 09/25/2021    Passive exposure: Past   Smokeless tobacco: Never   Tobacco comments:    Started smoking at 74 years old.     1PPD 12/20/2022 khj  Vaping Use   Vaping status: Never Used  Substance and Sexual Activity   Alcohol use: Not Currently   Drug use: No   Sexual activity: Not Currently  Other Topics Concern   Not on file  Social History Narrative   Divorced      1 child      Works at [employer]         Social Drivers of Health   Tobacco Use: High  Risk (04/19/2024)   Patient History    Smoking Tobacco Use: Every Day    Smokeless Tobacco Use: Never    Passive Exposure: Past  Financial Resource Strain: High Risk (01/06/2024)   Overall Financial Resource Strain (CARDIA)    Difficulty of Paying Living Expenses: Hard  Food Insecurity: Food Insecurity Present (01/06/2024)   Epic    Worried About Programme Researcher, Broadcasting/film/video in the Last Year: Sometimes true    Ran Out of Food in the Last Year: Never true  Transportation Needs: No Transportation Needs (01/06/2024)   Epic    Lack of Transportation (Medical): No    Lack of Transportation (Non-Medical): No  Physical Activity: Inactive (04/01/2023)   Exercise Vital Sign    Days of Exercise per Week: 0 days    Minutes of Exercise per Session: 0 min  Stress: No Stress Concern Present (04/01/2023)   [employer] of Occupational Health - Occupational Stress Questionnaire    Feeling of Stress : Not at all  Social Connections: Unknown (04/01/2023)   Social Connection and Isolation Panel    Frequency of Communication with Friends and Family: Never    Frequency of Social Gatherings with Friends and Family: Never    Attends Religious Services: Never    Database Administrator or Organizations: No    Attends Banker Meetings: Never    Marital Status: Not on file  Intimate Partner Violence: Not At Risk (04/01/2023)   Humiliation, Afraid, Rape, and Kick questionnaire    Fear of Current or Ex-Partner: No    Emotionally Abused: No    Physically Abused: No    Sexually Abused: No  Depression (PHQ2-9): Low Risk (04/10/2024)   Depression (PHQ2-9)    PHQ-2 Score: 0  Recent Concern: Depression (PHQ2-9) - High Risk (02/29/2024)   Depression (PHQ2-9)    PHQ-2 Score: 12  Alcohol Screen: Low Risk (04/01/2023)   Alcohol Screen    Last Alcohol Screening Score (AUDIT): 0  Housing: Low Risk (01/06/2024)   Epic    Unable to Pay for Housing in the Last Year: No    Number of Times Moved in the Last  Year: 0    Homeless in the Last Year: No  Utilities: Not At Risk (01/06/2024)   Epic    Threatened with loss  of utilities: No  Health Literacy: Adequate Health Literacy (01/06/2024)   B1300 Health Literacy    Frequency of need for help with medical instructions: Never      Review of systems: All other review of systems negative except as mentioned in the HPI.   Physical Exam: Vitals:   04/19/24 1418  BP: 118/60  Pulse: 82  SpO2: 97%   Body mass index is 18.11 kg/m. Gen:      No acute distress HEENT:  sclera anicteric CV: s1s2 rrr, no murmur Lungs: B/l clear. Abd:      soft, non-tender; no palpable masses, no distension Ext:    No edema Neuro: alert and oriented x 3 Psych: normal mood and affect  Data Reviewed:  Reviewed labs, radiology imaging, old records and pertinent past GI work up   Assessment & Plan Alcoholic cirrhosis of liver Chronic alcoholic cirrhosis with low MELD score and no evidence of hepatic decompensation. Sustained alcohol abstinence reduces risk of progression, but unpredictable deterioration remains possible with severe infection or surgery. - Advised continued alcohol abstinence. - Recommended increased dietary protein and reduced sugar intake.  - Ordered CBC, CMP, PT/INR, AFP, iron  panel, and B12 level to monitor liver function, screen for hepatocellular carcinoma, and assess for deficiencies. - Ordered liver ultrasound to hepatocellular carcinoma screening. - Provided anticipatory guidance regarding risk of acute deterioration with severe infection or surgery. - Scheduled follow-up in six months. - Will reconsider hepatology referral if laboratory or clinical status changes.  Vitamin B12 deficiency Vitamin B12 deficiency on supplementation; ongoing monitoring required to ensure adequate replacement. - Ordered B12 level to monitor response to supplementation.  History of chronic iron  deficiency due to chronic GI blood loss: Follow-up iron   panel and ferritin    Return in 6 months   This visit required 40 minutes of patient care (this includes precharting, chart review, review of results, face-to-face time used for counseling as well as treatment plan and follow-up. The patient was provided an opportunity to ask questions and all were answered. The patient agreed with the plan and demonstrated an understanding of the instructions.  Christine Barton , MD    CC: Tower, Laine LABOR, MD    "

## 2024-04-19 NOTE — Telephone Encounter (Signed)
 I have notified the patient. She will call back closer to July to schedule an appt.  Nothing further needed.

## 2024-04-19 NOTE — Patient Instructions (Addendum)
 VISIT SUMMARY:  Today, you had a follow-up visit to monitor your liver disease and other health concerns. Your liver function remains stable, and you have been advised to continue your current health practices. We also discussed your back pain, smoking habits, and vitamin B12 levels.  YOUR PLAN:  ALCOHOLIC CIRRHOSIS OF LIVER: You have chronic alcoholic cirrhosis with a stable MELD score and no signs of liver failure. -Continue to abstain from alcohol. -Increase dietary protein and reduce sugar intake. -Avoid lifting more than ten pounds. -We ordered blood tests (CBC, CMP, PT/INR, AFP, iron  panel, and B12 level) to monitor your liver function and screen for liver cancer. -We ordered a liver ultrasound to check for any structural changes, masses, or cysts. -Be aware of the risk of sudden liver problems if you get a severe infection or need surgery. -Follow-up appointment in six months. -We will consider referring you to a liver specialist if your lab results or clinical status change.  VITAMIN B12 DEFICIENCY: You have a vitamin B12 deficiency and are currently taking supplements. -We ordered a B12 level test to monitor your response to the supplements.  You will be contacted by [facility] Sched[facility] in the next 2 days to arrange a Ultrasound.  The number on your caller ID will be [phone], please answer when they call.  If you have not heard from them in 2 days please call [phone] to schedule.    Your provider has requested that you go to the basement level for lab work before leaving today. Press B on the elevator. The lab is located at the first door on the left as you exit the elevator.   Due to recent changes in healthcare laws, you may see the results of your imaging and laboratory studies on MyChart before your provider has had a chance to review them.  We understand that in some cases there may be results that are confusing or concerning to you. Not all laboratory  results come back in the same time frame and the provider may be waiting for multiple results in order to interpret others.  Please give us  48 hours in order for your provider to thoroughly review all the results before contacting the office for clarification of your results.    I appreciate the  opportunity to care for you  Thank You   Kavitha Nandigam , MD

## 2024-04-20 ENCOUNTER — Ambulatory Visit: Admitting: Pulmonary Disease

## 2024-04-21 ENCOUNTER — Other Ambulatory Visit: Payer: Self-pay | Admitting: Adult Health

## 2024-04-21 DIAGNOSIS — F411 Generalized anxiety disorder: Secondary | ICD-10-CM

## 2024-04-23 ENCOUNTER — Ambulatory Visit: Payer: Self-pay | Admitting: Gastroenterology

## 2024-04-23 LAB — AFP TUMOR MARKER: AFP-Tumor Marker: 2.2 ng/mL

## 2024-04-24 ENCOUNTER — Ambulatory Visit (HOSPITAL_COMMUNITY)

## 2024-04-26 NOTE — Telephone Encounter (Signed)
Still pending response

## 2024-04-27 NOTE — Telephone Encounter (Signed)
 Called pt and she isn't taking med and she doesn't need any refills. Rx declined and d/c off of med list.

## 2024-05-04 NOTE — Progress Notes (Shared)
 [facility] - Clinic Note  05/16/2024   CHIEF COMPLAINT Patient presents for No chief complaint on file.  HISTORY OF PRESENT ILLNESS: Christine Barton is a 74 y.o. female who presents to the clinic today for:   Pt states she can see an improvement OD in brightness and colors after cataract sx on 12.12.25 w/ Dr. Fleeta  Referring physician: [name], MD [address] [place],  [place] [address]  HISTORICAL INFORMATION:  Selected notes from the MEDICAL RECORD NUMBER Referred by Dr. Fleeta for ARMD eval LEE:  Ocular Hx- PMH-   CURRENT MEDICATIONS: Current Outpatient Medications (Ophthalmic Drugs)  Medication Sig   SYSTANE COMPLETE 0.6 % SOLN Apply 1 drop to eye 2 (two) times daily.   No current facility-administered medications for this visit. (Ophthalmic Drugs)   Current Outpatient Medications (Other)  Medication Sig   acetaminophen  (TYLENOL ) 500 MG tablet Take 500 mg by mouth every 6 (six) hours as needed for moderate pain.   albuterol  (VENTOLIN  HFA) 108 (90 Base) MCG/ACT inhaler INHALE 2 PUFFS INTO THE LUNGS EVERY 4 HOURS AS NEEDED FOR WHEEZE OR FOR SHORTNESS OF BREATH   cholecalciferol (VITAMIN D3) 25 MCG (1000 UNIT) tablet Take 1,000 Units by mouth daily.   clorazepate  (TRANXENE ) 3.75 MG tablet Take 1 tablet (3.75 mg total) by mouth 3 (three) times daily as needed for anxiety.   cyclobenzaprine  (FLEXERIL ) 10 MG tablet TAKE 1/2 TABLET BY MOUTH AT BEDTIME AS NEEDED FOR MUSCLE SPASMS   desvenlafaxine  (PRISTIQ ) 100 MG 24 hr tablet TAKE 1 TABLET BY MOUTH EVERY DAY   lactulose  (CHRONULAC ) 10 GM/15ML solution TAKE 15 ML BY MOUTH 2 TIMES DAILY AS NEEDED FOR SEVERE CONSTIPATION.   metFORMIN  (GLUCOPHAGE -XR) 500 MG 24 hr tablet Take 1 tablet (500 mg total) by mouth daily with breakfast.   Multiple Vitamin (MULTIVITAMIN) tablet Take 1 tablet by mouth daily.   Multiple Vitamins-Minerals (PRESERVISION AREDS 2) CAPS Take 1 capsule by mouth 2 (two) times daily.    mupirocin  ointment (BACTROBAN ) 2 % APPLY 1 APPLICATION TOPICALLY 2 TIMES DAILY AS NEEDED.   nadolol  (CORGARD ) 20 MG tablet TAKE 1 TABLET BY MOUTH EVERY DAY   nystatin  (MYCOSTATIN ) 100000 UNIT/ML suspension TAKE 5 MLS (500,000 UNITS TOTAL) BY MOUTH 3 (THREE) TIMES DAILY. SWISH AND SWALLOW (Patient taking differently: Take 5 mLs by mouth 3 (three) times daily as needed. Swish and swallow)   pantoprazole  (PROTONIX ) 40 MG tablet TAKE 1 TABLET BY MOUTH EVERY DAY (Patient taking differently: Take 40 mg by mouth 2 (two) times daily.)   Tiotropium Bromide Monohydrate  (SPIRIVA  RESPIMAT) 2.5 MCG/ACT AERS INHALE 2 PUFFS BY MOUTH INTO THE LUNGS DAILY   ursodiol  (ACTIGALL ) 300 MG capsule TAKE 1 CAPSULE BY MOUTH EVERY DAY   XIFAXAN  550 MG TABS tablet TAKE 1 TABLET BY MOUTH 2 TIMES DAILY.   No current facility-administered medications for this visit. (Other)   REVIEW OF SYSTEMS:      ALLERGIES Allergies  Allergen Reactions   Anoro Ellipta  [Umeclidinium-Vilanterol] Cough   Bevespi  Aerosphere [Glycopyrrolate-Formoterol ] Cough    Prolonged coughing episode with first dose   Oxycodone  Nausea And Vomiting   Crestor  [Rosuvastatin ]     Abdominal pain     Glipizide  Other (See Comments)    Stomach pain   Losartan      Pt thinks it caused urinary retention     Spironolactone      Urinary retention     PAST MEDICAL HISTORY Past Medical History:  Diagnosis Date  Alcohol abuse, unspecified    Breast cancer (HCC) 1998   Right   Cataract    left eye   Cervical spondylosis 2006   MRI   Degeneration of cervical intervertebral disc 2006   MRI   Diabetes mellitus without complication (HCC)    Diverticulosis of colon (without mention of hemorrhage)    Hyperpotassemia    Lung cancer (HCC) 03/2021   started radiation in January 2023, right side   Microscopic hematuria    Mononeuritis of unspecified site    Nonspecific abnormal results of liver function study    Other abnormal glucose    Other and  unspecified hyperlipidemia    no per pt   Other chronic nonalcoholic liver disease    Personal history of chemotherapy    Personal history of malignant neoplasm of breast    Personal history of radiation therapy    Pneumothorax, acute    right, spontaneous   Tobacco use disorder    Unspecified vitamin D  deficiency    Past Surgical History:  Procedure Laterality Date   BIOPSY  07/27/2019   Procedure: BIOPSY;  Surgeon: Leigh Elspeth SQUIBB, MD;  Location: [facility];  Service: Gastroenterology;;   BREAST BIOPSY  12/2001   Right   BREAST LUMPECTOMY Right 1998   CATARACT EXTRACTION Right 03/23/2024   CHEST TUBE INSERTION  11/02/2014   COLONOSCOPY     COLONOSCOPY WITH PROPOFOL  N/A 07/27/2019   Procedure: COLONOSCOPY WITH PROPOFOL ;  Surgeon: Leigh Elspeth SQUIBB, MD;  Location: [facility];  Service: Gastroenterology;  Laterality: N/A;   COLONOSCOPY WITH PROPOFOL  N/A 07/29/2022   Procedure: COLONOSCOPY WITH PROPOFOL ;  Surgeon: Shila Gustav GAILS, MD;  Location: [facility];  Service: Gastroenterology;  Laterality: N/A;   ESOPHAGOGASTRODUODENOSCOPY (EGD) WITH PROPOFOL  N/A 10/30/2018   Procedure: ESOPHAGOGASTRODUODENOSCOPY (EGD) WITH PROPOFOL ;  Surgeon: Shila Gustav GAILS, MD;  Location: [facility];  Service: Endoscopy;  Laterality: N/A;   ESOPHAGOGASTRODUODENOSCOPY (EGD) WITH PROPOFOL  N/A 03/12/2019   Procedure: ESOPHAGOGASTRODUODENOSCOPY (EGD) WITH PROPOFOL ;  Surgeon: Shila Gustav GAILS, MD;  Location: [facility];  Service: Endoscopy;  Laterality: N/A;   ESOPHAGOGASTRODUODENOSCOPY (EGD) WITH PROPOFOL  N/A 07/27/2019   Procedure: ESOPHAGOGASTRODUODENOSCOPY (EGD) WITH PROPOFOL ;  Surgeon: Leigh Elspeth SQUIBB, MD;  Location: [facility];  Service: Gastroenterology;  Laterality: N/A;   EYE SURGERY  02/2017   cataract extraction with lens implant-left   HEMOSTASIS CLIP PLACEMENT  07/29/2022   Procedure: HEMOSTASIS CLIP PLACEMENT;  Surgeon: Shila Gustav GAILS, MD;  Location: [facility];  Service: Gastroenterology;;   HOT HEMOSTASIS N/A 07/27/2019   Procedure: HOT HEMOSTASIS (ARGON PLASMA COAGULATION/BICAP);  Surgeon: Leigh Elspeth SQUIBB, MD;  Location: [facility];  Service: Gastroenterology;  Laterality: N/A;   MOUTH SURGERY     POLYPECTOMY  07/27/2019   Procedure: POLYPECTOMY;  Surgeon: Leigh Elspeth SQUIBB, MD;  Location: [facility];  Service: Gastroenterology;;   POLYPECTOMY  07/29/2022   Procedure: POLYPECTOMY;  Surgeon: Shila Gustav GAILS, MD;  Location: [facility];  Service: Gastroenterology;;   TUBAL LIGATION     FAMILY HISTORY Family History  Problem Relation Age of Onset   Stroke Mother    Heart failure Father    Heart attack Father    Heart failure Sister    Colon cancer Maternal Uncle    Esophageal cancer Neg Hx    Rectal cancer Neg Hx    Stomach cancer Neg Hx    Pancreatic cancer Neg Hx    SOCIAL HISTORY Social History   Tobacco Use   Smoking status: Every Day  Current packs/day: 1.00    Average packs/day: 1 pack/day for 59.5 years (59.5 ttl pk-yrs)    Types: Cigarettes    Start date: 09/25/2021    Passive exposure: Past   Smokeless tobacco: Never   Tobacco comments:    Started smoking at 74 years old.     1PPD 12/20/2022 khj  Vaping Use   Vaping status: Never Used  Substance Use Topics   Alcohol use: Not Currently   Drug use: No       OPHTHALMIC EXAM:  Not recorded    IMAGING AND PROCEDURES  Imaging and Procedures for 05/16/2024            ASSESSMENT/PLAN: No diagnosis found.  Exudative age related macular degeneration, OD - s/p IVA OD #1 (10.29.25), #2 (11.26.25), #3 (01.07.26) - BCVA OD 20/30 from 20/40 - s/p CE IOL 12.12.25 - OCT shows OD: Peripapillary SRHM/edema nasal and temporal to disc seen best on widefield -- slightly improved at 6 weeks due to holiday  - recommend IVA OD #4 today, 02.04.26 for exudative ARMD / peripapillary CNV, w/ f/u back to 4 wks  - pt wishes to be treated with IVA - RBA  of procedure discussed, questions answered - informed consent obtained and signed - see procedure note   - f/u in 4 wks -- DFE/OCT, possible injection   2. Age related macular degeneration, non-exudative, OS - The incidence, anatomy, and pathology of dry AMD, risk of progression, and the AREDS and AREDS 2 studies including smoking risks discussed with patient. - OCT shows OS: Nasal ORA/GA with extension to fovea  - Recommend amsler grid monitoring  3,4. Diabetes mellitus, type 2 without retinopathy  - A1C 6.3 (12.30.25), 6.7 (09.03.25) - The incidence, risk factors for progression, natural history and treatment options for diabetic retinopathy  were discussed with patient.   - The need for close monitoring of blood glucose, blood pressure, and serum lipids, avoiding cigarette or any type of tobacco, and the need for long term follow up was also discussed with patient. - f/u in 1 year, sooner prn  5. Pseudophakia OU  - s/p sutured IOL? OS - Dr. Raj at [facility] - OD 12.12.25 w/ Dr Fleeta  - IOL in good position  - post op drops per Dr. Fleeta  - monitor   Ophthalmic Meds Ordered this visit:  No orders of the defined types were placed in this encounter.    No follow-ups on file.  There are no Patient Instructions on file for this visit.  Explained the diagnoses, plan, and follow up with the patient and they expressed understanding.  Patient expressed understanding of the importance of proper follow up care.   This document serves as a record of services personally performed by Redell JUDITHANN Hans, MD, PhD. It was created on their behalf by Almetta Pesa, an ophthalmic technician. The creation of this record is the provider's dictation and/or activities during the visit.    Electronically signed by: Almetta Pesa, OA, 05/04/24  12:13 PM    Redell JUDITHANN Hans, M.D., Ph.D. Diseases & Surgery of the Retina and Vitreous [facility] 05/16/2024   Abbreviations: M myopia (nearsighted); A astigmatism; H hyperopia (farsighted); P presbyopia; Mrx spectacle prescription;  CTL contact lenses; OD right eye; OS left eye; OU both eyes  XT exotropia; ET esotropia; PEK punctate epithelial keratitis; PEE punctate epithelial erosions; DES dry eye syndrome; MGD meibomian gland dysfunction; ATs artificial tears; PFAT's preservative free artificial tears;  NSC nuclear sclerotic cataract; PSC posterior subcapsular cataract; ERM epi-retinal membrane; PVD posterior vitreous detachment; RD retinal detachment; DM diabetes mellitus; DR diabetic retinopathy; NPDR non-proliferative diabetic retinopathy; PDR proliferative diabetic retinopathy; CSME clinically significant macular edema; DME diabetic macular edema; dbh dot blot hemorrhages; CWS cotton wool spot; POAG primary open angle glaucoma; C/D cup-to-disc ratio; HVF humphrey visual field; GVF goldmann visual field; OCT optical coherence tomography; IOP intraocular pressure; BRVO Branch retinal vein occlusion; CRVO central retinal vein occlusion; CRAO central retinal artery occlusion; BRAO branch retinal artery occlusion; RT retinal tear; SB scleral buckle; PPV pars plana vitrectomy; VH Vitreous hemorrhage; PRP panretinal laser photocoagulation; IVK intravitreal kenalog ; VMT vitreomacular traction; MH Macular hole;  NVD neovascularization of the disc; NVE neovascularization elsewhere; AREDS age related eye disease study; ARMD age related macular degeneration; POAG primary open angle glaucoma; EBMD epithelial/anterior basement membrane dystrophy; ACIOL anterior chamber intraocular lens; IOL intraocular lens; PCIOL posterior chamber intraocular lens; Phaco/IOL phacoemulsification with intraocular lens placement; PRK photorefractive keratectomy; LASIK laser assisted in situ keratomileusis; HTN hypertension; DM diabetes mellitus; COPD chronic obstructive pulmonary disease

## 2024-05-16 ENCOUNTER — Encounter (INDEPENDENT_AMBULATORY_CARE_PROVIDER_SITE_OTHER): Admitting: Ophthalmology

## 2024-05-16 DIAGNOSIS — Z961 Presence of intraocular lens: Secondary | ICD-10-CM

## 2024-05-16 DIAGNOSIS — H353123 Nonexudative age-related macular degeneration, left eye, advanced atrophic without subfoveal involvement: Secondary | ICD-10-CM

## 2024-05-16 DIAGNOSIS — E119 Type 2 diabetes mellitus without complications: Secondary | ICD-10-CM

## 2024-05-16 DIAGNOSIS — H353211 Exudative age-related macular degeneration, right eye, with active choroidal neovascularization: Secondary | ICD-10-CM

## 2024-05-16 NOTE — Progress Notes (Shared)
 [facility] - Clinic Note  05/18/2024   CHIEF COMPLAINT Patient presents for Retina Follow Up  HISTORY OF PRESENT ILLNESS: Christine Barton is a 74 y.o. female who presents to the clinic today for:  HPI     Retina Follow Up   Patient presents with  Wet AMD.  In both eyes.  This started 4 weeks ago.  Duration of 4 weeks.  I, the attending physician,  performed the HPI with the patient and updated documentation appropriately.        Comments   4 week retina follow up ARMD OD and IVA OD. Pt states VA is the same. Pt states in the morning OD has a big blob that doesn't last that long after she gets up.- has a red tint to it.  last A1C 6.7       Last edited by Ashley Beards D on 05/18/2024  1:43 PM.     Pt states   Referring physician: [name], MD [id number] [place],  [place] [address]  HISTORICAL INFORMATION:  Selected notes from the MEDICAL RECORD NUMBER Referred by Dr. Fleeta for ARMD eval LEE:  Ocular Hx- PMH-   CURRENT MEDICATIONS: Current Outpatient Medications (Ophthalmic Drugs)  Medication Sig   SYSTANE COMPLETE 0.6 % SOLN Apply 1 drop to eye 2 (two) times daily.   No current facility-administered medications for this visit. (Ophthalmic Drugs)   Current Outpatient Medications (Other)  Medication Sig   acetaminophen  (TYLENOL ) 500 MG tablet Take 500 mg by mouth every 6 (six) hours as needed for moderate pain.   albuterol  (VENTOLIN  HFA) 108 (90 Base) MCG/ACT inhaler INHALE 2 PUFFS INTO THE LUNGS EVERY 4 HOURS AS NEEDED FOR WHEEZE OR FOR SHORTNESS OF BREATH   cholecalciferol (VITAMIN D3) 25 MCG (1000 UNIT) tablet Take 1,000 Units by mouth daily.   clorazepate  (TRANXENE ) 3.75 MG tablet Take 1 tablet (3.75 mg total) by mouth 3 (three) times daily as needed for anxiety.   cyclobenzaprine  (FLEXERIL ) 10 MG tablet TAKE 1/2 TABLET BY MOUTH AT BEDTIME AS NEEDED FOR MUSCLE SPASMS   desvenlafaxine  (PRISTIQ ) 100 MG 24 hr tablet TAKE 1 TABLET BY  MOUTH EVERY DAY   lactulose  (CHRONULAC ) 10 GM/15ML solution TAKE 15 ML BY MOUTH 2 TIMES DAILY AS NEEDED FOR SEVERE CONSTIPATION.   metFORMIN  (GLUCOPHAGE -XR) 500 MG 24 hr tablet Take 1 tablet (500 mg total) by mouth daily with breakfast.   Multiple Vitamin (MULTIVITAMIN) tablet Take 1 tablet by mouth daily.   Multiple Vitamins-Minerals (PRESERVISION AREDS 2) CAPS Take 1 capsule by mouth 2 (two) times daily.   mupirocin  ointment (BACTROBAN ) 2 % APPLY 1 APPLICATION TOPICALLY 2 TIMES DAILY AS NEEDED.   nadolol  (CORGARD ) 20 MG tablet TAKE 1 TABLET BY MOUTH EVERY DAY   nystatin  (MYCOSTATIN ) 100000 UNIT/ML suspension TAKE 5 MLS (500,000 UNITS TOTAL) BY MOUTH 3 (THREE) TIMES DAILY. SWISH AND SWALLOW (Patient taking differently: Take 5 mLs by mouth 3 (three) times daily as needed. Swish and swallow)   pantoprazole  (PROTONIX ) 40 MG tablet TAKE 1 TABLET BY MOUTH EVERY DAY (Patient taking differently: Take 40 mg by mouth 2 (two) times daily.)   rifaximin  (XIFAXAN ) 550 MG TABS tablet Take 1 tablet (550 mg total) by mouth 2 (two) times daily.   Tiotropium Bromide Monohydrate  (SPIRIVA  RESPIMAT) 2.5 MCG/ACT AERS INHALE 2 PUFFS BY MOUTH INTO THE LUNGS DAILY   ursodiol  (ACTIGALL ) 300 MG capsule TAKE 1 CAPSULE BY MOUTH EVERY DAY   No current facility-administered medications  for this visit. (Other)   REVIEW OF SYSTEMS:      ALLERGIES Allergies  Allergen Reactions   Anoro Ellipta  [Umeclidinium-Vilanterol] Cough   Bevespi  Aerosphere [Glycopyrrolate-Formoterol ] Cough    Prolonged coughing episode with first dose   Oxycodone  Nausea And Vomiting   Crestor  [Rosuvastatin ]     Abdominal pain     Glipizide  Other (See Comments)    Stomach pain   Losartan      Pt thinks it caused urinary retention     Spironolactone      Urinary retention     PAST MEDICAL HISTORY Past Medical History:  Diagnosis Date   Alcohol abuse, unspecified    Breast cancer (HCC) 1998   Right   Cataract    left eye   Cervical  spondylosis 2006   MRI   Degeneration of cervical intervertebral disc 2006   MRI   Diabetes mellitus without complication (HCC)    Diverticulosis of colon (without mention of hemorrhage)    Hyperpotassemia    Lung cancer (HCC) 03/2021   started radiation in January 2023, right side   Microscopic hematuria    Mononeuritis of unspecified site    Nonspecific abnormal results of liver function study    Other abnormal glucose    Other and unspecified hyperlipidemia    no per pt   Other chronic nonalcoholic liver disease    Personal history of chemotherapy    Personal history of malignant neoplasm of breast    Personal history of radiation therapy    Pneumothorax, acute    right, spontaneous   Tobacco use disorder    Unspecified vitamin D  deficiency    Past Surgical History:  Procedure Laterality Date   BIOPSY  07/27/2019   Procedure: BIOPSY;  Surgeon: Leigh Elspeth SQUIBB, MD;  Location: [facility];  Service: Gastroenterology;;   BREAST BIOPSY  12/2001   Right   BREAST LUMPECTOMY Right 1998   CATARACT EXTRACTION Right 03/23/2024   CHEST TUBE INSERTION  11/02/2014   COLONOSCOPY     COLONOSCOPY WITH PROPOFOL  N/A 07/27/2019   Procedure: COLONOSCOPY WITH PROPOFOL ;  Surgeon: Leigh Elspeth SQUIBB, MD;  Location: [facility];  Service: Gastroenterology;  Laterality: N/A;   COLONOSCOPY WITH PROPOFOL  N/A 07/29/2022   Procedure: COLONOSCOPY WITH PROPOFOL ;  Surgeon: Shila Gustav GAILS, MD;  Location: [facility];  Service: Gastroenterology;  Laterality: N/A;   ESOPHAGOGASTRODUODENOSCOPY (EGD) WITH PROPOFOL  N/A 10/30/2018   Procedure: ESOPHAGOGASTRODUODENOSCOPY (EGD) WITH PROPOFOL ;  Surgeon: Shila Gustav GAILS, MD;  Location: [facility];  Service: Endoscopy;  Laterality: N/A;   ESOPHAGOGASTRODUODENOSCOPY (EGD) WITH PROPOFOL  N/A 03/12/2019   Procedure: ESOPHAGOGASTRODUODENOSCOPY (EGD) WITH PROPOFOL ;  Surgeon: Shila Gustav GAILS, MD;  Location: [facility];  Service: Endoscopy;   Laterality: N/A;   ESOPHAGOGASTRODUODENOSCOPY (EGD) WITH PROPOFOL  N/A 07/27/2019   Procedure: ESOPHAGOGASTRODUODENOSCOPY (EGD) WITH PROPOFOL ;  Surgeon: Leigh Elspeth SQUIBB, MD;  Location: [facility];  Service: Gastroenterology;  Laterality: N/A;   EYE SURGERY  02/2017   cataract extraction with lens implant-left   HEMOSTASIS CLIP PLACEMENT  07/29/2022   Procedure: HEMOSTASIS CLIP PLACEMENT;  Surgeon: Shila Gustav GAILS, MD;  Location: [facility];  Service: Gastroenterology;;   HOT HEMOSTASIS N/A 07/27/2019   Procedure: HOT HEMOSTASIS (ARGON PLASMA COAGULATION/BICAP);  Surgeon: Leigh Elspeth SQUIBB, MD;  Location: [facility];  Service: Gastroenterology;  Laterality: N/A;   MOUTH SURGERY     POLYPECTOMY  07/27/2019   Procedure: POLYPECTOMY;  Surgeon: Leigh Elspeth SQUIBB, MD;  Location: [facility];  Service: Gastroenterology;;   POLYPECTOMY  07/29/2022  Procedure: POLYPECTOMY;  Surgeon: Shila Gustav GAILS, MD;  Location: [facility];  Service: Gastroenterology;;   TUBAL LIGATION     FAMILY HISTORY Family History  Problem Relation Age of Onset   Stroke Mother    Heart failure Father    Heart attack Father    Heart failure Sister    Colon cancer Maternal Uncle    Esophageal cancer Neg Hx    Rectal cancer Neg Hx    Stomach cancer Neg Hx    Pancreatic cancer Neg Hx    SOCIAL HISTORY Social History   Tobacco Use   Smoking status: Every Day    Current packs/day: 1.00    Average packs/day: 1 pack/day for 59.5 years (59.5 ttl pk-yrs)    Types: Cigarettes    Start date: 09/25/2021    Passive exposure: Past   Smokeless tobacco: Never   Tobacco comments:    Started smoking at 74 years old.     1PPD 12/20/2022 khj  Vaping Use   Vaping status: Never Used  Substance Use Topics   Alcohol use: Not Currently   Drug use: No       OPHTHALMIC EXAM:  Base Eye Exam     Visual Acuity (Snellen - Linear)       Right Left   Dist cc 20/40 20/40 -1   Dist ph cc 20/30 -2 NI     Correction: Glasses         Tonometry (Tonopen, 1:48 PM)       Right Left   Pressure 13 18         Pupils       Pupils Dark Light Shape React APD   Right PERRL 3 2 Round Brisk None   Left PERRL 3 2 Round Brisk None         Visual Fields       Left Right    Full Full         Extraocular Movement       Right Left    Full, Ortho Full, Ortho         Neuro/Psych     Oriented x3: Yes   Mood/Affect: Normal         Dilation     Both eyes: 1.0% Mydriacyl, 2.5% Phenylephrine @ 1:48 PM           Slit Lamp and Fundus Exam     External Exam       Right Left   External Normal Normal         Slit Lamp Exam       Right Left   Lids/Lashes Normal Meibomian gland dysfunction   Conjunctiva/Sclera White and quiet White and quiet   Cornea Trace Punctate epithelial erosions, Debris in tear film, Well healed temporal cataract wound Trace Punctate epithelial erosions, Arcus, Well healed temporal cataract wound   Anterior Chamber Deep, narrow temporal angle Deep and quiet   Iris Round and dilated, No NVI Round and poorly dilated   Lens PC IOL in good position Sutured 3PC IOL in good position (? akreos with mild superior displacement)   Anterior Vitreous Vitreous syneresis post vitrectomy, clear         Fundus Exam       Right Left   Disc Pink and sharp, paripapilary  CNV w/ edema nasal disc Pallor, Sharp rim, Temporal PPA   C/D Ratio 0.2 0.3   Macula Flat, Blunted foveal reflex, RPE mottling, clumping, and atrophy, early GA, no  heme, mild peripapillary edema nasal mac Flat, Blunted foveal reflex, Drusen, RPE mottling, clumping, and atrophy, + GA nasal mac with pigment ring   Vessels Vascular attenuation, Tortuous Vascular attenuation, Tortuous   Periphery Attached, Reticular degeneration, No heme Attached, Reticular degeneration, No heme           Refraction     Wearing Rx       Sphere Cylinder Axis Add   Right -3.00 +1.00 013 +2.50   Left  -3.75 +2.00 016 +2.50           IMAGING AND PROCEDURES  Imaging and Procedures for 05/18/2024  OCT, Retina - OU - Both Eyes       Right Eye Quality was good. Central Foveal Thickness: 253. Progression has improved. Findings include normal foveal contour, no SRF, retinal drusen , subretinal hyper-reflective material, intraretinal hyper-reflective material, outer retinal atrophy (Peripapillary SRHM/edema nasal and temporal to disc seen best on widefield--slightly improved).   Left Eye Quality was good. Central Foveal Thickness: 252. Progression has been stable. Findings include no IRF, no SRF, abnormal foveal contour, retinal drusen , intraretinal hyper-reflective material, outer retinal atrophy (Nasal ORA/GA with extension to fovea).   Notes *Images captured and stored on drive  Diagnosis / Impression:  OD: Peripapillary SRHM/edema nasal and temporal of disc seen best on widefield--improved OS: Nasal ORA/GA with extension to fovea  Clinical management:  See below  Abbreviations: NFP - Normal foveal profile. CME - cystoid macular edema. PED - pigment epithelial detachment. IRF - intraretinal fluid. SRF - subretinal fluid. EZ - ellipsoid zone. ERM - epiretinal membrane. ORA - outer retinal atrophy. ORT - outer retinal tubulation. SRHM - subretinal hyper-reflective material. IRHM - intraretinal hyper-reflective material               ASSESSMENT/PLAN:   ICD-10-CM   1. Exudative age-related macular degeneration of right eye with active choroidal neovascularization (HCC)  H35.3211 OCT, Retina - OU - Both Eyes    2. Advanced atrophic nonexudative age-related macular degeneration of left eye without subfoveal involvement  H35.3123     3. Diabetes mellitus without complication (HCC)  E11.9     4. Diabetes mellitus treated with oral medication (HCC)  E11.9    Z79.84     5. Pseudophakia of both eyes  Z96.1       Exudative age related macular degeneration, OD - s/p IVA OD #1  (10.29.25), #2 (11.26.25), #3 (01.07.26) - BCVA OD 20/30 from 20/40 - s/p CE IOL 12.12.25 - OCT shows OD: Peripapillary SRHM/edema nasal and temporal to disc seen best on widefield-- slightly improved at 4 weeks - recommend IVA OD #4 today, 02.06.26 for exudative ARMD / peripapillary CNV, w/ f/u back to 4 wks  - pt wishes to be treated with IVA - RBA of procedure discussed, questions answered - informed consent obtained and signed - Eylea approved but pt would owe 20% - see procedure note   - f/u in 4 wks -- DFE/OCT, possible injection   2. Age related macular degeneration, non-exudative, OS - The incidence, anatomy, and pathology of dry AMD, risk of progression, and the AREDS and AREDS 2 studies including smoking risks discussed with patient. - OCT shows OS: Nasal ORA/GA with extension to fovea  - Recommend amsler grid monitoring  3,4. Diabetes mellitus, type 2 without retinopathy  - A1C 6.3 (12.30.25), 6.7 (09.03.25) - The incidence, risk factors for progression, natural history and treatment options for diabetic retinopathy  were discussed with patient.   -  The need for close monitoring of blood glucose, blood pressure, and serum lipids, avoiding cigarette or any type of tobacco, and the need for long term follow up was also discussed with patient. - f/u in 1 year, sooner prn  5. Pseudophakia OU  - s/p sutured IOL? OS - Dr. Raj at [facility] - OD 12.12.25 w/ Dr Fleeta  - IOL in good position  - post op drops per Dr. Fleeta  - monitor   Ophthalmic Meds Ordered this visit:  No orders of the defined types were placed in this encounter.    No follow-ups on file.  There are no Patient Instructions on file for this visit.  Explained the diagnoses, plan, and follow up with the patient and they expressed understanding.  Patient expressed understanding of the importance of proper follow up care.   This document serves as a record of services personally performed by Redell JUDITHANN Hans,  MD, PhD. It was created on their behalf by Almetta Pesa, an ophthalmic technician. The creation of this record is the provider's dictation and/or activities during the visit.    This document serves as a record of services personally performed by Redell JUDITHANN Hans, MD, PhD. It was created on their behalf by Delon Newness COT, an ophthalmic technician. The creation of this record is the provider's dictation and/or activities during the visit.    Electronically signed by: Delon Newness COT 02.04.26 1:54 PM   This document serves as a record of services personally performed by Redell JUDITHANN Hans, MD, PhD. It was created on their behalf by Almetta Pesa, an ophthalmic technician. The creation of this record is the provider's dictation and/or activities during the visit.    Electronically signed by: Almetta Pesa, OA, 05/18/24  1:54 PM   Redell JUDITHANN Hans, M.D., Ph.D. Diseases & Surgery of the Retina and Vitreous [facility] 05/18/2024   Abbreviations: M myopia (nearsighted); A astigmatism; H hyperopia (farsighted); P presbyopia; Mrx spectacle prescription;  CTL contact lenses; OD right eye; OS left eye; OU both eyes  XT exotropia; ET esotropia; PEK punctate epithelial keratitis; PEE punctate epithelial erosions; DES dry eye syndrome; MGD meibomian gland dysfunction; ATs artificial tears; PFAT's preservative free artificial tears; NSC nuclear sclerotic cataract; PSC posterior subcapsular cataract; ERM epi-retinal membrane; PVD posterior vitreous detachment; RD retinal detachment; DM diabetes mellitus; DR diabetic retinopathy; NPDR non-proliferative diabetic retinopathy; PDR proliferative diabetic retinopathy; CSME clinically significant macular edema; DME diabetic macular edema; dbh dot blot hemorrhages; CWS cotton wool spot; POAG primary open angle glaucoma; C/D cup-to-disc ratio; HVF humphrey visual field; GVF goldmann visual field; OCT optical coherence  tomography; IOP intraocular pressure; BRVO Branch retinal vein occlusion; CRVO central retinal vein occlusion; CRAO central retinal artery occlusion; BRAO branch retinal artery occlusion; RT retinal tear; SB scleral buckle; PPV pars plana vitrectomy; VH Vitreous hemorrhage; PRP panretinal laser photocoagulation; IVK intravitreal kenalog ; VMT vitreomacular traction; MH Macular hole;  NVD neovascularization of the disc; NVE neovascularization elsewhere; AREDS age related eye disease study; ARMD age related macular degeneration; POAG primary open angle glaucoma; EBMD epithelial/anterior basement membrane dystrophy; ACIOL anterior chamber intraocular lens; IOL intraocular lens; PCIOL posterior chamber intraocular lens; Phaco/IOL phacoemulsification with intraocular lens placement; PRK photorefractive keratectomy; LASIK laser assisted in situ keratomileusis; HTN hypertension; DM diabetes mellitus; COPD chronic obstructive pulmonary disease

## 2024-05-17 ENCOUNTER — Telehealth: Payer: Self-pay | Admitting: Gastroenterology

## 2024-05-17 NOTE — Telephone Encounter (Signed)
 Patient is requesting a refill for xifaxan . States medication needs prior authorization. Please advise, Thank you.

## 2024-05-18 ENCOUNTER — Encounter (INDEPENDENT_AMBULATORY_CARE_PROVIDER_SITE_OTHER): Payer: Self-pay | Admitting: Ophthalmology

## 2024-05-18 ENCOUNTER — Other Ambulatory Visit (HOSPITAL_COMMUNITY): Payer: Self-pay

## 2024-05-18 ENCOUNTER — Encounter: Payer: Self-pay | Admitting: Physician Assistant

## 2024-05-18 ENCOUNTER — Telehealth: Payer: Self-pay

## 2024-05-18 ENCOUNTER — Ambulatory Visit (INDEPENDENT_AMBULATORY_CARE_PROVIDER_SITE_OTHER): Admitting: Ophthalmology

## 2024-05-18 DIAGNOSIS — E119 Type 2 diabetes mellitus without complications: Secondary | ICD-10-CM

## 2024-05-18 DIAGNOSIS — Z961 Presence of intraocular lens: Secondary | ICD-10-CM

## 2024-05-18 DIAGNOSIS — H353123 Nonexudative age-related macular degeneration, left eye, advanced atrophic without subfoveal involvement: Secondary | ICD-10-CM

## 2024-05-18 DIAGNOSIS — H353211 Exudative age-related macular degeneration, right eye, with active choroidal neovascularization: Secondary | ICD-10-CM

## 2024-05-18 MED ORDER — BEVACIZUMAB CHEMO INJECTION 1.25MG/0.05ML SYRINGE FOR KALEIDOSCOPE
1.2500 mg | INTRAVITREAL | Status: AC | PRN
  Administered 2024-05-18: 1.25 mg via INTRAVITREAL

## 2024-05-18 MED ORDER — RIFAXIMIN 550 MG PO TABS: 550.0000 mg | ORAL_TABLET | Freq: Two times a day (BID) | ORAL | 3 refills | Status: AC

## 2024-05-18 NOTE — Telephone Encounter (Signed)
 PA request has been Submitted. New Encounter has been or will be created for follow up. For additional info see Pharmacy Prior Auth telephone encounter from 05-18-2024.

## 2024-05-18 NOTE — Telephone Encounter (Signed)
 Sent in Xifaxan  refills, I will inform the Rx team it may need a prior authorization     Rx Team, see note Thank You

## 2024-05-18 NOTE — Telephone Encounter (Signed)
 Pharmacy Patient Advocate Encounter   Received notification from Pt Calls Messages that prior authorization for Xifaxan  550MG  tablets is required/requested.   Insurance verification completed.   The patient is insured through [facility] ADV[employer].   Per test claim: PA required; PA submitted to above mentioned insurance via Latent Key/confirmation #/EOC [id number] Status is pending

## 2024-06-15 ENCOUNTER — Encounter (INDEPENDENT_AMBULATORY_CARE_PROVIDER_SITE_OTHER): Admitting: Ophthalmology

## 2024-08-21 ENCOUNTER — Telehealth: Admitting: Adult Health

## 2024-10-15 ENCOUNTER — Other Ambulatory Visit

## 2024-10-22 ENCOUNTER — Ambulatory Visit: Admitting: Internal Medicine
